# Patient Record
Sex: Female | Born: 1950 | Race: White | Hispanic: No | Smoking: Former smoker
Health system: Southern US, Community
[De-identification: ages and names within clinical notes are randomized; demographics above are authoritative.]

## PROBLEM LIST (undated history)

## (undated) DIAGNOSIS — C50919 Malignant neoplasm of unspecified site of unspecified female breast: Secondary | ICD-10-CM

## (undated) DIAGNOSIS — R002 Palpitations: Secondary | ICD-10-CM

## (undated) DIAGNOSIS — J449 Chronic obstructive pulmonary disease, unspecified: Secondary | ICD-10-CM

## (undated) DIAGNOSIS — R52 Pain, unspecified: Secondary | ICD-10-CM

## (undated) DIAGNOSIS — R7303 Prediabetes: Secondary | ICD-10-CM

## (undated) DIAGNOSIS — F32A Depression, unspecified: Secondary | ICD-10-CM

## (undated) DIAGNOSIS — Z923 Personal history of irradiation: Secondary | ICD-10-CM

## (undated) DIAGNOSIS — R0600 Dyspnea, unspecified: Secondary | ICD-10-CM

## (undated) DIAGNOSIS — I10 Essential (primary) hypertension: Secondary | ICD-10-CM

## (undated) DIAGNOSIS — R198 Other specified symptoms and signs involving the digestive system and abdomen: Secondary | ICD-10-CM

## (undated) DIAGNOSIS — J189 Pneumonia, unspecified organism: Secondary | ICD-10-CM

## (undated) DIAGNOSIS — N951 Menopausal and female climacteric states: Secondary | ICD-10-CM

## (undated) DIAGNOSIS — M1990 Unspecified osteoarthritis, unspecified site: Secondary | ICD-10-CM

## (undated) DIAGNOSIS — I8390 Asymptomatic varicose veins of unspecified lower extremity: Secondary | ICD-10-CM

## (undated) DIAGNOSIS — J45909 Unspecified asthma, uncomplicated: Secondary | ICD-10-CM

## (undated) DIAGNOSIS — K625 Hemorrhage of anus and rectum: Secondary | ICD-10-CM

## (undated) DIAGNOSIS — F329 Major depressive disorder, single episode, unspecified: Secondary | ICD-10-CM

## (undated) DIAGNOSIS — G4700 Insomnia, unspecified: Secondary | ICD-10-CM

## (undated) DIAGNOSIS — Z9221 Personal history of antineoplastic chemotherapy: Secondary | ICD-10-CM

## (undated) DIAGNOSIS — T8859XA Other complications of anesthesia, initial encounter: Secondary | ICD-10-CM

## (undated) DIAGNOSIS — I83813 Varicose veins of bilateral lower extremities with pain: Secondary | ICD-10-CM

## (undated) DIAGNOSIS — F410 Panic disorder [episodic paroxysmal anxiety] without agoraphobia: Secondary | ICD-10-CM

## (undated) DIAGNOSIS — K219 Gastro-esophageal reflux disease without esophagitis: Secondary | ICD-10-CM

## (undated) DIAGNOSIS — J329 Chronic sinusitis, unspecified: Secondary | ICD-10-CM

## (undated) DIAGNOSIS — C801 Malignant (primary) neoplasm, unspecified: Secondary | ICD-10-CM

## (undated) HISTORY — DX: Unspecified asthma, uncomplicated: J45.909

## (undated) HISTORY — DX: Asymptomatic varicose veins of unspecified lower extremity: I83.90

## (undated) HISTORY — DX: Depression, unspecified: F32.A

## (undated) HISTORY — DX: Pain, unspecified: R52

## (undated) HISTORY — DX: Hemorrhage of anus and rectum: K62.5

## (undated) HISTORY — DX: Insomnia, unspecified: G47.00

## (undated) HISTORY — DX: Other specified symptoms and signs involving the digestive system and abdomen: R19.8

## (undated) HISTORY — DX: Major depressive disorder, single episode, unspecified: F32.9

## (undated) HISTORY — DX: Menopausal and female climacteric states: N95.1

## (undated) HISTORY — DX: Chronic sinusitis, unspecified: J32.9

## (undated) HISTORY — DX: Malignant neoplasm of unspecified site of unspecified female breast: C50.919

## (undated) HISTORY — DX: Chronic obstructive pulmonary disease, unspecified: J44.9

## (undated) HISTORY — DX: Palpitations: R00.2

## (undated) HISTORY — PX: TUBAL LIGATION: SHX77

---

## 1983-07-26 HISTORY — PX: CHOLECYSTECTOMY: SHX55

## 2000-07-25 HISTORY — PX: COLONOSCOPY: SHX174

## 2004-11-16 ENCOUNTER — Emergency Department (HOSPITAL_COMMUNITY): Admission: EM | Admit: 2004-11-16 | Discharge: 2004-11-16 | Payer: Self-pay | Admitting: *Deleted

## 2006-11-28 ENCOUNTER — Emergency Department (HOSPITAL_COMMUNITY): Admission: EM | Admit: 2006-11-28 | Discharge: 2006-11-28 | Payer: Self-pay | Admitting: Emergency Medicine

## 2010-08-09 ENCOUNTER — Emergency Department (HOSPITAL_COMMUNITY)
Admission: EM | Admit: 2010-08-09 | Discharge: 2010-08-09 | Payer: Self-pay | Source: Home / Self Care | Admitting: Emergency Medicine

## 2012-04-17 ENCOUNTER — Encounter (HOSPITAL_COMMUNITY): Payer: Self-pay

## 2012-04-17 ENCOUNTER — Emergency Department (HOSPITAL_COMMUNITY)
Admission: EM | Admit: 2012-04-17 | Discharge: 2012-04-17 | Disposition: A | Discharge status: Home / Self Care | Payer: Self-pay | Attending: Emergency Medicine | Admitting: Emergency Medicine

## 2012-04-17 DIAGNOSIS — R51 Headache: Secondary | ICD-10-CM | POA: Insufficient documentation

## 2012-04-17 DIAGNOSIS — Z800 Family history of malignant neoplasm of digestive organs: Secondary | ICD-10-CM | POA: Insufficient documentation

## 2012-04-17 DIAGNOSIS — I10 Essential (primary) hypertension: Secondary | ICD-10-CM | POA: Insufficient documentation

## 2012-04-17 DIAGNOSIS — R42 Dizziness and giddiness: Secondary | ICD-10-CM | POA: Insufficient documentation

## 2012-04-17 DIAGNOSIS — Z8041 Family history of malignant neoplasm of ovary: Secondary | ICD-10-CM | POA: Insufficient documentation

## 2012-04-17 DIAGNOSIS — Z8249 Family history of ischemic heart disease and other diseases of the circulatory system: Secondary | ICD-10-CM | POA: Insufficient documentation

## 2012-04-17 DIAGNOSIS — F17200 Nicotine dependence, unspecified, uncomplicated: Secondary | ICD-10-CM | POA: Insufficient documentation

## 2012-04-17 DIAGNOSIS — IMO0001 Reserved for inherently not codable concepts without codable children: Secondary | ICD-10-CM

## 2012-04-17 DIAGNOSIS — Z823 Family history of stroke: Secondary | ICD-10-CM | POA: Insufficient documentation

## 2012-04-17 LAB — CBC WITH DIFFERENTIAL/PLATELET
Basophils Absolute: 0 10*3/uL (ref 0.0–0.1)
Basophils Relative: 1 % (ref 0–1)
Eosinophils Absolute: 0.2 10*3/uL (ref 0.0–0.7)
Eosinophils Relative: 3 % (ref 0–5)
HCT: 50.4 % — ABNORMAL HIGH (ref 36.0–46.0)
Hemoglobin: 17.6 g/dL — ABNORMAL HIGH (ref 12.0–15.0)
Lymphocytes Relative: 26 % (ref 12–46)
Lymphs Abs: 2.3 10*3/uL (ref 0.7–4.0)
MCH: 30.7 pg (ref 26.0–34.0)
MCHC: 34.9 g/dL (ref 30.0–36.0)
MCV: 87.8 fL (ref 78.0–100.0)
Monocytes Absolute: 0.4 10*3/uL (ref 0.1–1.0)
Monocytes Relative: 5 % (ref 3–12)
Neutro Abs: 5.7 10*3/uL (ref 1.7–7.7)
Neutrophils Relative %: 66 % (ref 43–77)
Platelets: 234 10*3/uL (ref 150–400)
RBC: 5.74 MIL/uL — ABNORMAL HIGH (ref 3.87–5.11)
RDW: 12.7 % (ref 11.5–15.5)
WBC: 8.7 10*3/uL (ref 4.0–10.5)

## 2012-04-17 LAB — URINALYSIS, ROUTINE W REFLEX MICROSCOPIC
Bilirubin Urine: NEGATIVE
Glucose, UA: NEGATIVE mg/dL
Hgb urine dipstick: NEGATIVE
Ketones, ur: NEGATIVE mg/dL
Leukocytes, UA: NEGATIVE
Nitrite: NEGATIVE
Protein, ur: NEGATIVE mg/dL
Specific Gravity, Urine: 1.02 (ref 1.005–1.030)
Urobilinogen, UA: 0.2 mg/dL (ref 0.0–1.0)
pH: 6.5 (ref 5.0–8.0)

## 2012-04-17 LAB — COMPREHENSIVE METABOLIC PANEL
ALT: 13 U/L (ref 0–35)
AST: 12 U/L (ref 0–37)
Albumin: 3.9 g/dL (ref 3.5–5.2)
Alkaline Phosphatase: 69 U/L (ref 39–117)
BUN: 12 mg/dL (ref 6–23)
CO2: 31 mEq/L (ref 19–32)
Calcium: 9.7 mg/dL (ref 8.4–10.5)
Chloride: 102 mEq/L (ref 96–112)
Creatinine, Ser: 0.71 mg/dL (ref 0.50–1.10)
GFR calc Af Amer: >90 mL/min (ref >90)
GFR calc non Af Amer: >90 mL/min (ref >90)
Glucose, Bld: 97 mg/dL (ref 70–99)
Potassium: 4 mEq/L (ref 3.5–5.1)
Sodium: 139 mEq/L (ref 135–145)
Total Bilirubin: 1.2 mg/dL (ref 0.3–1.2)
Total Protein: 7.1 g/dL (ref 6.0–8.3)

## 2012-04-17 MED ORDER — LISINOPRIL-HYDROCHLOROTHIAZIDE 10-12.5 MG PO TABS: 1.0000 | ORAL_TABLET | Freq: Every day | ORAL | Status: DC

## 2012-04-17 MED ORDER — MECLIZINE HCL 25 MG PO TABS: ORAL_TABLET | ORAL | Status: DC

## 2012-04-17 MED ORDER — ONDANSETRON 8 MG PO TBDP
8.0000 mg | ORAL_TABLET | Freq: Once | ORAL | Status: AC
  Administered 2012-04-17: 8 mg via ORAL
  Filled 2012-04-17: qty 1

## 2012-04-17 MED ORDER — PROMETHAZINE HCL 25 MG PO TABS: 25.0000 mg | ORAL_TABLET | Freq: Four times a day (QID) | ORAL | Status: DC | PRN

## 2012-04-17 NOTE — ED Provider Notes (Cosign Needed)
History   This chart was scribed for Ward Givens, MD by Gerlean Ren. This patient was seen in room APA12/APA12 and the patient's care was started at 3:01PM.   CSN: [id number]  Arrival date & time 04/17/12  1327   First MD Initiated Contact with Patient 04/17/12 1407      Chief Complaint  Patient presents with  . Hypertension    (Consider location/radiation/quality/duration/timing/severity/associated sxs/prior treatment) The history is provided by the patient. No language interpreter was used.  Regina Mcmahon is a 61 y.o. female who presents to the Emergency Department complaining of elevated BP with associated nausea and HA.  Pt further reports that she experienced "room spinning" dizziness when waking up 3 days ago causing pt to lay in bed until 1:30PM and has had a HA located at the crown of the head since incident.States she has had this headache before.  States she had to hold onto the wall to walk b/o spinning and she also had nausea with the spinning.  Pt reports taking tylenol PM over past 3 nights to alleviate HA.  Pt denies any numbness, tingling, emesis, chest pain, or unusual SOB since dizziness incident.  Pt reports blurry vision but attributes this to the need for prescription eyeglasses.States her legs swell and hurt at the end of the day. Has some nausea today.   Pt further reports generalized weakness over past 2-3 weeks.  Pt is a current everyday smoker but denies alcohol use.   PCP none  History reviewed. No pertinent past medical history.  Past Surgical History  Procedure Date  . Cholecystectomy     No family history on file. HTN CAD Strokes MOP died age 29 from ovarian cancer Brother died age 48 from colon cancer  History  Substance Use Topics  . Smoking status: Current Every Day Smoker  . Smokeless tobacco: Not on file  . Alcohol Use: No  employed as lab tech  No OB history.    Review of Systems  All other systems reviewed and are  negative.    Allergies  Review of patient's allergies indicates no known allergies.  Home Medications   Current Outpatient Rx  Name Route Sig Dispense Refill  . DIPHENHYDRAMINE-APAP (SLEEP) 25-500 MG PO TABS Oral Take 2 tablets by mouth at bedtime.      BP 191/110  Pulse 89  Temp 98.2 F (36.8 C) (Oral)  Resp 20  Ht 5\' 4"  (1.626 m)  Wt 231 lb 1 oz (104.809 kg)  BMI 39.66 kg/m2  SpO2 98%  Vital signs normal except hypertension   Physical Exam  Nursing note and vitals reviewed. Constitutional: She is oriented to person, place, and time. She appears well-developed and well-nourished.  Non-toxic appearance. She does not appear ill. No distress.  HENT:  Head: Normocephalic and atraumatic.  Right Ear: External ear normal.  Left Ear: External ear normal.  Nose: Nose normal. No mucosal edema or rhinorrhea.  Mouth/Throat: Oropharynx is clear and moist and mucous membranes are normal. No dental abscesses or uvula swelling.  Eyes: Conjunctivae normal and EOM are normal. Pupils are equal, round, and reactive to light.  Neck: Normal range of motion and full passive range of motion without pain. Neck supple.       No bruits.    Cardiovascular: Normal rate, regular rhythm and normal heart sounds.  Exam reveals no gallop and no friction rub.   No murmur heard. Pulmonary/Chest: Effort normal and breath sounds normal. No respiratory distress. She has no  wheezes. She has no rhonchi. She has no rales. She exhibits no tenderness and no crepitus.  Abdominal: Soft. Normal appearance and bowel sounds are normal. She exhibits no distension. There is no tenderness. There is no rebound and no guarding.  Musculoskeletal: Normal range of motion. She exhibits no edema and no tenderness.       Moves all extremities well.   Neurological: She is alert and oriented to person, place, and time. She has normal strength. No cranial nerve deficit.  Skin: Skin is warm, dry and intact. No rash noted. No  erythema. No pallor.  Psychiatric: She has a normal mood and affect. Her speech is normal and behavior is normal. Her mood appears not anxious.    ED Course  Procedures (including critical care time) DIAGNOSTIC STUDIES: Oxygen Saturation is 98% on room air, normal by my interpretation.    COORDINATION OF CARE: 3:11PM- Discussed treatment plan with pt at bedside and pt agreed to plan.  BP at time of my exam was 136/87.   At discharge states she can be followed at the [facility]    Results for orders placed during the hospital encounter of 04/17/12  CBC WITH DIFFERENTIAL      Component Value Range   WBC 8.7  4.0 - 10.5 K/uL   RBC 5.74 (*) 3.87 - 5.11 MIL/uL   Hemoglobin 17.6 (*) 12.0 - 15.0 g/dL   HCT 16.1 (*) 09.6 - 04.5 %   MCV 87.8  78.0 - 100.0 fL   MCH 30.7  26.0 - 34.0 pg   MCHC 34.9  30.0 - 36.0 g/dL   RDW 40.9  81.1 - 91.4 %   Platelets 234  150 - 400 K/uL   Neutrophils Relative 66  43 - 77 %   Neutro Abs 5.7  1.7 - 7.7 K/uL   Lymphocytes Relative 26  12 - 46 %   Lymphs Abs 2.3  0.7 - 4.0 K/uL   Monocytes Relative 5  3 - 12 %   Monocytes Absolute 0.4  0.1 - 1.0 K/uL   Eosinophils Relative 3  0 - 5 %   Eosinophils Absolute 0.2  0.0 - 0.7 K/uL   Basophils Relative 1  0 - 1 %   Basophils Absolute 0.0  0.0 - 0.1 K/uL  COMPREHENSIVE METABOLIC PANEL      Component Value Range   Sodium 139  135 - 145 mEq/L   Potassium 4.0  3.5 - 5.1 mEq/L   Chloride 102  96 - 112 mEq/L   CO2 31  19 - 32 mEq/L   Glucose, Bld 97  70 - 99 mg/dL   BUN 12  6 - 23 mg/dL   Creatinine, Ser 7.82  0.50 - 1.10 mg/dL   Calcium 9.7  8.4 - 95.6 mg/dL   Total Protein 7.1  6.0 - 8.3 g/dL   Albumin 3.9  3.5 - 5.2 g/dL   AST 12  0 - 37 U/L   ALT 13  0 - 35 U/L   Alkaline Phosphatase 69  39 - 117 U/L   Total Bilirubin 1.2  0.3 - 1.2 mg/dL   GFR calc non Af Amer >90  >90 mL/min   GFR calc Af Amer >90  >90 mL/min  URINALYSIS, ROUTINE W REFLEX MICROSCOPIC      Component Value Range   Color, Urine  YELLOW  YELLOW   APPearance CLEAR  CLEAR   Specific Gravity, Urine 1.020  1.005 - 1.030   pH 6.5  5.0 - 8.0   Glucose, UA NEGATIVE  NEGATIVE mg/dL   Hgb urine dipstick NEGATIVE  NEGATIVE   Bilirubin Urine NEGATIVE  NEGATIVE   Ketones, ur NEGATIVE  NEGATIVE mg/dL   Protein, ur NEGATIVE  NEGATIVE mg/dL   Urobilinogen, UA 0.2  0.0 - 1.0 mg/dL   Nitrite NEGATIVE  NEGATIVE   Leukocytes, UA NEGATIVE  NEGATIVE   Laboratory interpretation all normal    Date: 04/17/2012  Rate: 87  Rhythm: normal sinus rhythm  QRS Axis: normal  Intervals: normal  ST/T Wave abnormalities: normal  Conduction Disutrbances:none  Narrative Interpretation: Septal Q waves  Old EKG Reviewed: unchanged from 11/28/2006    1. Vertigo   2. Headache   3. Elevated blood pressure    New Prescriptions   LISINOPRIL-HYDROCHLOROTHIAZIDE (PRINZIDE) 10-12.5 MG PER TABLET    Take 1 tablet by mouth daily.   MECLIZINE (ANTIVERT) 25 MG TABLET    Take 1 or 2 po Q 6hrs for pain   PROMETHAZINE (PHENERGAN) 25 MG TABLET    Take 1 tablet (25 mg total) by mouth every 6 (six) hours as needed for nausea.    Plan discharge  Devoria AlbeIva Tor Tsuda, MD, FACEP    MDM   I personally performed the services described in this documentation, which was scribed in my presence. The recorded information has been reviewed and considered.  Devoria AlbeIva Mujtaba Bollig, MD, FACEP         Ward GivensIva L Denny Lave, MD 04/17/12 [phone]

## 2012-04-17 NOTE — ED Notes (Signed)
Pt c/o high blood pressure that first noticed 2 days ago. Pt states she has never been dx with high BP but has had "flare ups" in the past. Pt also c/o headache but denies weakness.

## 2012-04-17 NOTE — Discharge Instructions (Signed)
Start the blood pressure medications. Follow-up at the [facility] Department in the next 2-3 weeks. You can take the antivert/phenergan for dizziness or nausea as needed. Return to the ED if you get a severe headache, chest pain, shortness of breath or seem worse.

## 2012-04-17 NOTE — ED Notes (Signed)
Pt states she has been checking her blood pressure the past few days and it has been elvelated. States she has also been dizzy.

## 2012-05-08 ENCOUNTER — Emergency Department (HOSPITAL_COMMUNITY)
Admission: EM | Admit: 2012-05-08 | Discharge: 2012-05-08 | Disposition: A | Discharge status: Home / Self Care | Payer: Self-pay | Attending: Emergency Medicine | Admitting: Emergency Medicine

## 2012-05-08 ENCOUNTER — Encounter (HOSPITAL_COMMUNITY): Payer: Self-pay

## 2012-05-08 DIAGNOSIS — R0602 Shortness of breath: Secondary | ICD-10-CM | POA: Insufficient documentation

## 2012-05-08 DIAGNOSIS — R5381 Other malaise: Secondary | ICD-10-CM | POA: Insufficient documentation

## 2012-05-08 DIAGNOSIS — F17200 Nicotine dependence, unspecified, uncomplicated: Secondary | ICD-10-CM | POA: Insufficient documentation

## 2012-05-08 DIAGNOSIS — R51 Headache: Secondary | ICD-10-CM | POA: Insufficient documentation

## 2012-05-08 DIAGNOSIS — Z79899 Other long term (current) drug therapy: Secondary | ICD-10-CM | POA: Insufficient documentation

## 2012-05-08 DIAGNOSIS — H538 Other visual disturbances: Secondary | ICD-10-CM | POA: Insufficient documentation

## 2012-05-08 DIAGNOSIS — E669 Obesity, unspecified: Secondary | ICD-10-CM | POA: Insufficient documentation

## 2012-05-08 DIAGNOSIS — I10 Essential (primary) hypertension: Secondary | ICD-10-CM | POA: Insufficient documentation

## 2012-05-08 DIAGNOSIS — R42 Dizziness and giddiness: Secondary | ICD-10-CM | POA: Insufficient documentation

## 2012-05-08 DIAGNOSIS — R0789 Other chest pain: Secondary | ICD-10-CM | POA: Insufficient documentation

## 2012-05-08 DIAGNOSIS — R5383 Other fatigue: Secondary | ICD-10-CM | POA: Insufficient documentation

## 2012-05-08 DIAGNOSIS — R531 Weakness: Secondary | ICD-10-CM

## 2012-05-08 HISTORY — DX: Panic disorder (episodic paroxysmal anxiety): F41.0

## 2012-05-08 HISTORY — DX: Essential (primary) hypertension: I10

## 2012-05-08 LAB — POCT I-STAT TROPONIN I: Troponin i, poc: 0 ng/mL (ref 0.00–0.08)

## 2012-05-08 NOTE — ED Notes (Signed)
Pt reports weakness and high bp that started today around noon, was told by ems that she had a heart attack and needed to be checked out.

## 2012-05-08 NOTE — ED Provider Notes (Cosign Needed)
History  This chart was scribed for Ward GivensIva L Gilles Trimpe, MD by Ladona Ridgelaylor Day. This patient was seen in room APA18/APA18 and the patient's care was started at 1402.  CSN: [id number]  Arrival date & time 05/08/12  1402   First MD Initiated Contact with Patient 05/08/12 1432     Chief Complaint  Patient presents with  . Weakness  . Hypertension   Patient is a 61 y.o. female presenting with hypertension. The history is provided by the patient. No language interpreter was used.  Hypertension This is a recurrent problem. The current episode started 3 to 5 hours ago. The problem occurs constantly. The problem has been gradually improving. Associated symptoms include headaches. Pertinent negatives include no chest pain and no abdominal pain. The symptoms are aggravated by stress. Nothing relieves the symptoms. She has tried nothing for the symptoms.   Shelia Mediakealene P Christians is a 61 y.o. female who presents to the Emergency Department complaining of episode of elevated BP  2 hours ago while she was at work but has somewhat improved. She states began feeling weak, dizzy, posterior HA radiating to right scapula, chest tightness, blurry vision while at work today and overall stressful morning. She reports she was late to work and when she got to work she was extremely behind in getting her work done. EMS was called and recorded SBP about 200 then 166 before they left. Her initial BP has improved since here it was 134/95. She denies any diaphoresis or nausea. She has been taking her daily BP medicines in the AM. She was seen by local health department last Thursday, 5 days ago and has another appointment this Thursday in 2 days.  Patient reports after being very stressed at work she went outside to sit in her car and eat lunch. She states that she got very dizzy and lightheaded and had pain in the back of her head that radiated into both her shoulder blades. She denies diaphoresis. She went back into work. She states when she  stood up she felt very weak and felt like she was going to pass out. She also states she was eating her lunch outside and felt like she couldn't swallow her food. She also states she felt some shortness of breath. She denies any chest pain. She is concerned because EMS told her it appeared that she had had a heart attack on her EKG.  She smokes 1.5 ppd and does not drink.  [facility]  Past Medical History  Diagnosis Date  . Hypertension   . Panic attacks     Past Surgical History  Procedure Date  . Cholecystectomy    No family history on file.  History  Substance Use Topics  . Smoking status: Current Every Day Smoker 1 1/2 ppd    Types: Cigarettes  . Smokeless tobacco: Not on file  . Alcohol Use: No  employed  OB History    Grav Para Term Preterm Abortions TAB SAB Ect Mult Living                 Review of Systems  Constitutional: Negative for fever and chills.  HENT: Negative for congestion.   Respiratory: Positive for chest tightness. Negative for cough.   Cardiovascular: Negative for chest pain.  Gastrointestinal: Negative for nausea, vomiting, abdominal pain and diarrhea.  Musculoskeletal: Negative for back pain.  Neurological: Positive for dizziness and headaches.  All other systems reviewed and are negative.   Allergies  Review of patient's  allergies indicates no known allergies.  Home Medications   Current Outpatient Rx  Name Route Sig Dispense Refill  . DIPHENHYDRAMINE-APAP (SLEEP) 25-500 MG PO TABS Oral Take 2 tablets by mouth at bedtime.    Marland Kitchen LISINOPRIL-HYDROCHLOROTHIAZIDE 10-12.5 MG PO TABS Oral Take 1 tablet by mouth daily. 30 tablet 0  . MECLIZINE HCL 25 MG PO TABS  Take 1 or 2 po Q 6hrs for pain 60 tablet 0  . PROMETHAZINE HCL 25 MG PO TABS Oral Take 1 tablet (25 mg total) by mouth every 6 (six) hours as needed for nausea. 10 tablet 0    Triage Vitals: BP 134/95  Pulse 82  Temp 98.4 F (36.9 C) (Oral)  Resp 20  Ht   (1.626 m)  Wt 226 lb (102.513 kg)  BMI 38.79 kg/m2  SpO2 100%  Vital signs normal    Physical Exam  Nursing note and vitals reviewed. Constitutional: She is oriented to person, place, and time. She appears well-developed and well-nourished.  Non-toxic appearance. She does not appear ill. No distress.       Obese   HENT:  Head: Normocephalic and atraumatic.  Right Ear: External ear normal.  Left Ear: External ear normal.  Nose: Nose normal. No mucosal edema or rhinorrhea.  Mouth/Throat: Mucous membranes are normal. No dental abscesses or uvula swelling.       Dry mucous membranes  Eyes: Conjunctivae normal and EOM are normal. Pupils are equal, round, and reactive to light.  Neck: Normal range of motion and full passive range of motion without pain. Neck supple.  Cardiovascular: Normal rate, regular rhythm and normal heart sounds.  Exam reveals no gallop and no friction rub.   No murmur heard. Pulmonary/Chest: Effort normal. No respiratory distress. She has wheezes (mild expiratory wheeze). She has no rhonchi. She has no rales. She exhibits no tenderness and no crepitus.  Abdominal: Soft. Normal appearance and bowel sounds are normal. She exhibits no distension. There is no tenderness. There is no rebound and no guarding.  Musculoskeletal: Normal range of motion. She exhibits no edema and no tenderness.       Moves all extremities well.   Neurological: She is alert and oriented to person, place, and time. She has normal strength. No cranial nerve deficit.  Skin: Skin is warm, dry and intact. No rash noted. No erythema. No pallor.  Psychiatric: She has a normal mood and affect. Her speech is normal and behavior is normal. Her mood appears not anxious.   ED Course  Procedures (including critical care time) Medications - No data to display DIAGNOSTIC STUDIES: Oxygen Saturation is 100% on room air, normal by my interpretation.    COORDINATION OF CARE: At 315 PM Discussed  treatment plan with patient which includes keeping a BP log at home tracking her BP throughout the day. Patient agrees.    Orthostatics were negative. BP remains 130/71 without treatment.   patient relates the discomfort in the back of her head and her shoulder blades has resolved as her blood pressure has improved.  Results for orders placed during the hospital encounter of 05/08/12  POCT I-STAT TROPONIN I      Component Value Range   Troponin i, poc 0.00  0.00 - 0.08 ng/mL   Comment 3            Troponin 0.0  Laboratory interpretation all normal except concentrated Hb (smokes 1 1/2 ppd)   Date: 05/08/2012  Rate: 78  Rhythm: normal sinus rhythm  QRS Axis: left  Intervals: normal  ST/T Wave abnormalities: normal  Conduction Disutrbances:IRBBB  Narrative Interpretation:   Old EKG Reviewed: unchanged from 04/17/2012 (and from May 2008     1. Hypertension   2. Episode of generalized weakness     Plan discharge  Devoria Albe, MD, FACEP   MDM  I personally performed the services described in this documentation, which was scribed in my presence. The recorded information has been reviewed and considered.  Devoria Albe, MD, Armando Gang          Ward Givens, MD 05/08/12 1728

## 2012-05-08 NOTE — Discharge Instructions (Signed)
Make a blood pressure log like we discussed so you can show your doctor. [facility], let her know about your ED visit today.

## 2012-10-22 ENCOUNTER — Other Ambulatory Visit (HOSPITAL_COMMUNITY): Payer: Self-pay | Admitting: Pulmonary Disease

## 2012-10-22 DIAGNOSIS — Z139 Encounter for screening, unspecified: Secondary | ICD-10-CM

## 2012-10-23 ENCOUNTER — Ambulatory Visit (HOSPITAL_COMMUNITY)
Admission: RE | Admit: 2012-10-23 | Discharge: 2012-10-23 | Disposition: A | Discharge status: Home / Self Care | Payer: 59 | Source: Ambulatory Visit | Attending: Pulmonary Disease | Admitting: Pulmonary Disease

## 2012-10-23 DIAGNOSIS — Z1231 Encounter for screening mammogram for malignant neoplasm of breast: Secondary | ICD-10-CM | POA: Insufficient documentation

## 2012-10-23 DIAGNOSIS — Z139 Encounter for screening, unspecified: Secondary | ICD-10-CM

## 2012-10-23 HISTORY — PX: BREAST BIOPSY: SHX20

## 2012-10-23 IMAGING — MG MM DIGITAL SCREENING BILAT W/ CAD
5 series · 5 of 5 positions shown · non-contrast
Comparison: Previous exams.

CLINICAL DATA: Screening.

DIGITAL BILATERAL SCREENING MAMMOGRAM WITH CAD

[L CC]
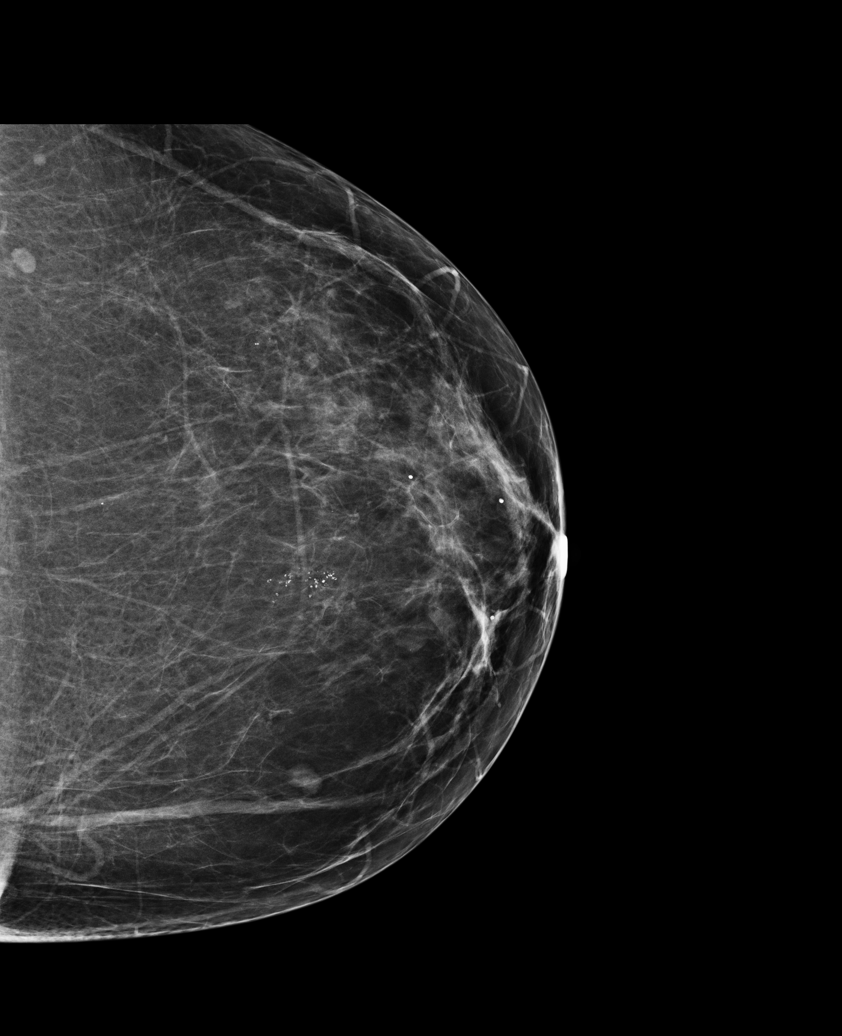

[L MLO]
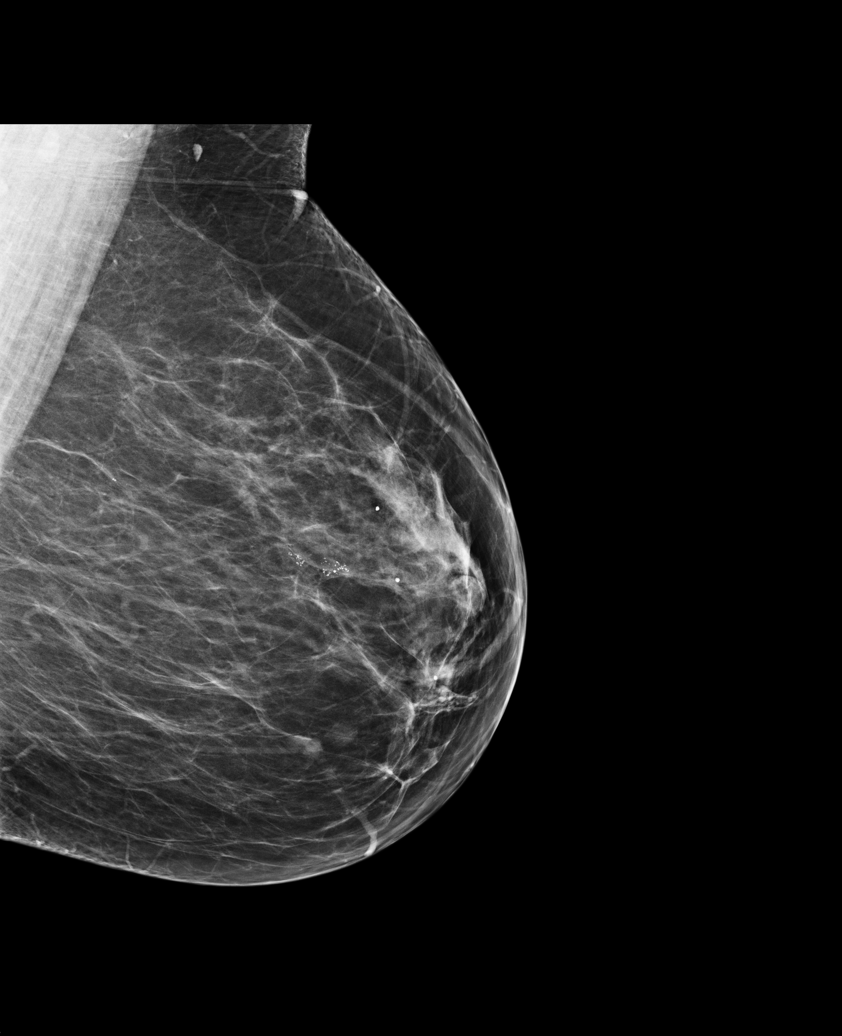

[R CC]
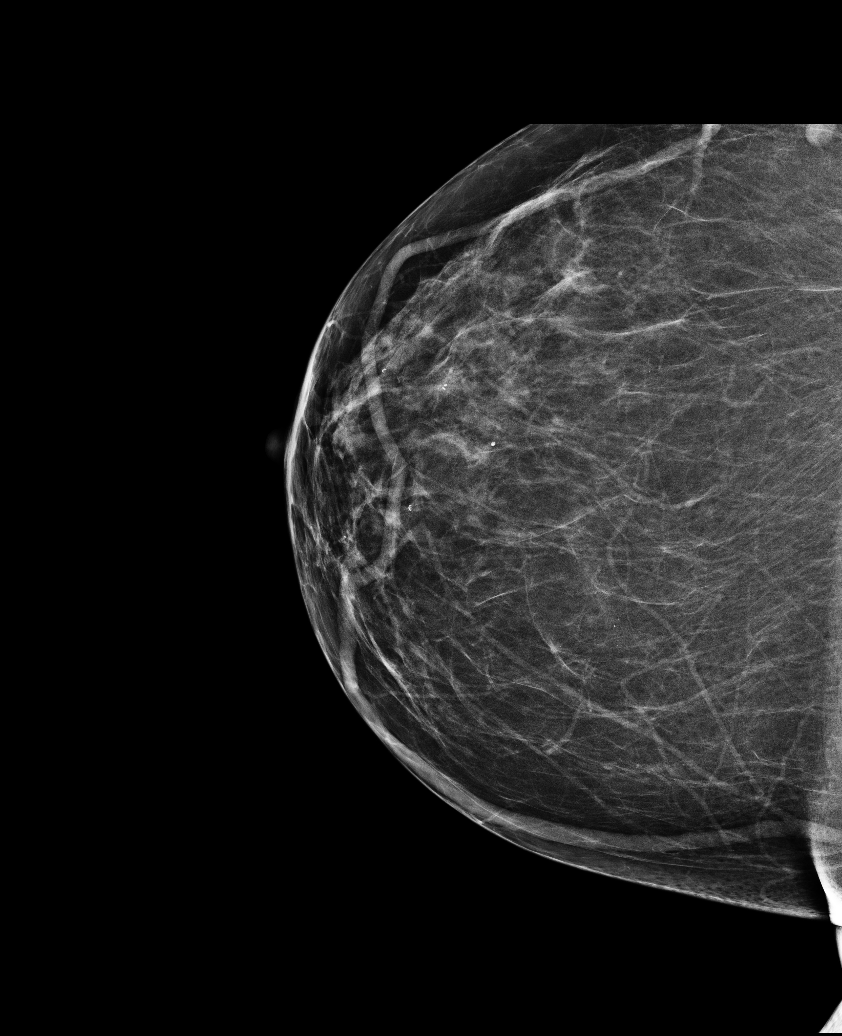

[R MLO (1 of 2)]
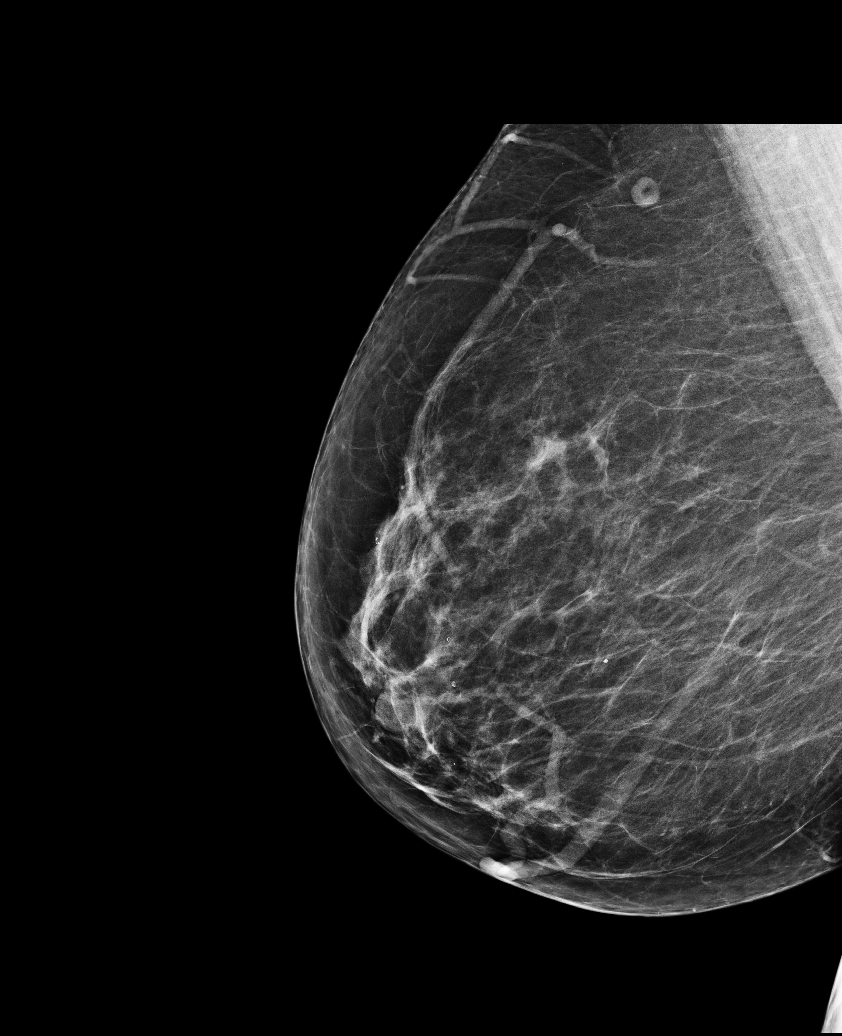

[R MLO (2 of 2)]
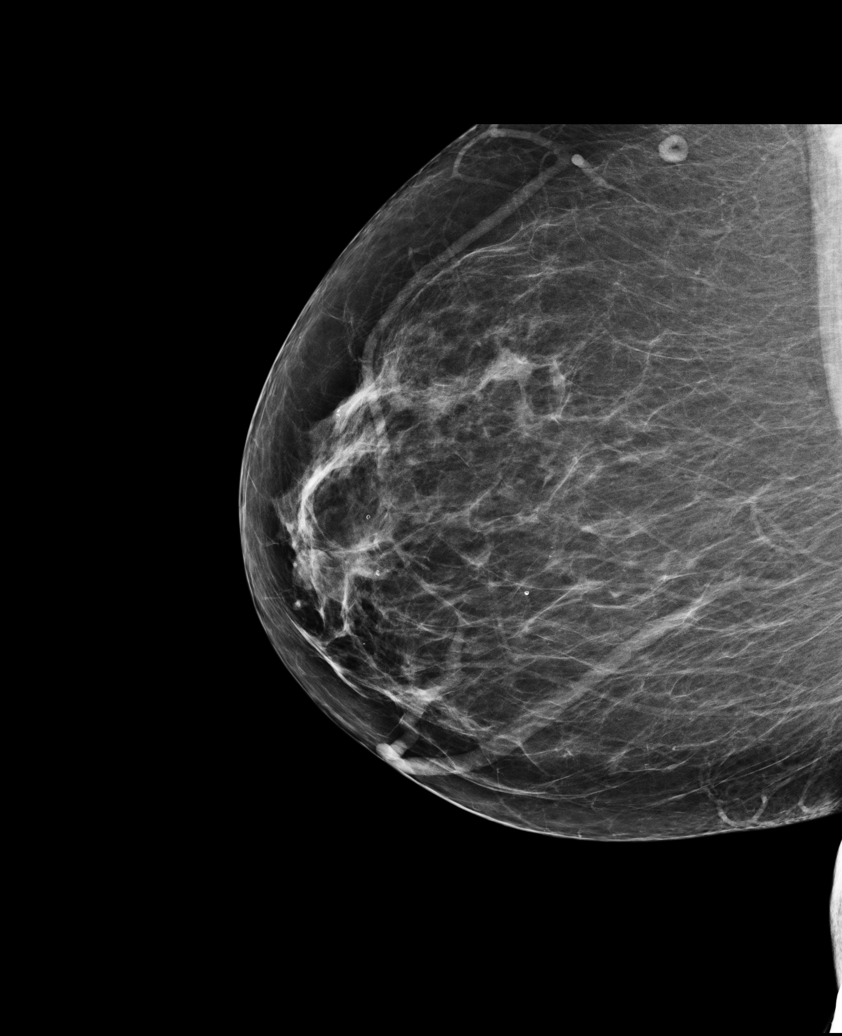

[5 of 5 positions shown; findings below may reference images not displayed]

FINDINGS: ACR Breast Density Category 2: There is a scattered fibroglandular
pattern.

In the left breast, calcifications warrant further evaluation with
magnified views.  In the right breast, no mass or malignant type
calcifications are identified.

Images were processed with CAD.
IMPRESSION: Further evaluation is suggested for calcifications in the left
breast.

RECOMMENDATION:
Diagnostic mammogram of the left breast. (Code:[VS])

The patient will be contacted regarding the findings, and
additional imaging will be scheduled.

BI-RADS CATEGORY 0:  Incomplete.  Need additional imaging
evaluation and/or prior mammograms for comparison.

## 2012-10-24 ENCOUNTER — Other Ambulatory Visit: Payer: Self-pay | Admitting: Pulmonary Disease

## 2012-10-24 DIAGNOSIS — R928 Other abnormal and inconclusive findings on diagnostic imaging of breast: Secondary | ICD-10-CM

## 2012-10-29 ENCOUNTER — Encounter: Payer: Self-pay | Admitting: *Deleted

## 2012-10-29 DIAGNOSIS — K921 Melena: Secondary | ICD-10-CM

## 2012-10-29 DIAGNOSIS — R197 Diarrhea, unspecified: Secondary | ICD-10-CM | POA: Insufficient documentation

## 2012-10-29 DIAGNOSIS — F17200 Nicotine dependence, unspecified, uncomplicated: Secondary | ICD-10-CM

## 2012-10-29 DIAGNOSIS — I10 Essential (primary) hypertension: Secondary | ICD-10-CM | POA: Insufficient documentation

## 2012-10-29 DIAGNOSIS — F419 Anxiety disorder, unspecified: Secondary | ICD-10-CM

## 2012-10-29 DIAGNOSIS — J449 Chronic obstructive pulmonary disease, unspecified: Secondary | ICD-10-CM

## 2012-10-29 DIAGNOSIS — R52 Pain, unspecified: Secondary | ICD-10-CM

## 2012-10-29 DIAGNOSIS — F17210 Nicotine dependence, cigarettes, uncomplicated: Secondary | ICD-10-CM | POA: Insufficient documentation

## 2012-10-30 ENCOUNTER — Encounter (INDEPENDENT_AMBULATORY_CARE_PROVIDER_SITE_OTHER): Payer: Self-pay | Admitting: *Deleted

## 2012-11-07 ENCOUNTER — Ambulatory Visit (HOSPITAL_COMMUNITY)
Admission: RE | Admit: 2012-11-07 | Discharge: 2012-11-07 | Disposition: A | Discharge status: Home / Self Care | Payer: 59 | Source: Ambulatory Visit | Attending: Pulmonary Disease | Admitting: Pulmonary Disease

## 2012-11-07 ENCOUNTER — Other Ambulatory Visit: Payer: Self-pay | Admitting: Pulmonary Disease

## 2012-11-07 DIAGNOSIS — R928 Other abnormal and inconclusive findings on diagnostic imaging of breast: Secondary | ICD-10-CM | POA: Insufficient documentation

## 2012-11-07 DIAGNOSIS — R921 Mammographic calcification found on diagnostic imaging of breast: Secondary | ICD-10-CM

## 2012-11-12 ENCOUNTER — Encounter: Payer: Self-pay | Admitting: Obstetrics and Gynecology

## 2012-11-14 ENCOUNTER — Ambulatory Visit
Admission: RE | Admit: 2012-11-14 | Discharge: 2012-11-14 | Disposition: A | Discharge status: Home / Self Care | Payer: 59 | Source: Ambulatory Visit | Attending: Pulmonary Disease | Admitting: Pulmonary Disease

## 2012-11-14 DIAGNOSIS — R921 Mammographic calcification found on diagnostic imaging of breast: Secondary | ICD-10-CM

## 2012-11-14 IMAGING — MG MM BX 1ST LESION STEREO
2 series · 2 of 2 positions shown · non-contrast
Comparison: Previous exams.

***ADDENDUM*** CREATED: [DATE] [DATE]

Pathology revealed a fibroadenoma with calcifications in the left
breast. This was found to be concordant by Dr. JIM.
Pathology was relayed to the patient by telephone. The patient
reported doing well after the biopsy with minimal tenderness at the
site. Post biopsy instructions were reviewed and her questions were
answered. She was encouraged to call The [REDACTED] for any additional concerns. She was asked to
return in 1 year for screening mammography.
Pathology results are dictated by JIM RN, BSN on [DATE].
***END ADDENDUM*** SIGNED BY: JIM, M.D.
CLINICAL DATA: Left breast calcifications.
STEREOTACTIC-GUIDED VACUUM ASSISTED BIOPSY OF THE LEFT BREAST AND
SPECIMEN RADIOGRAPH

[L CC]
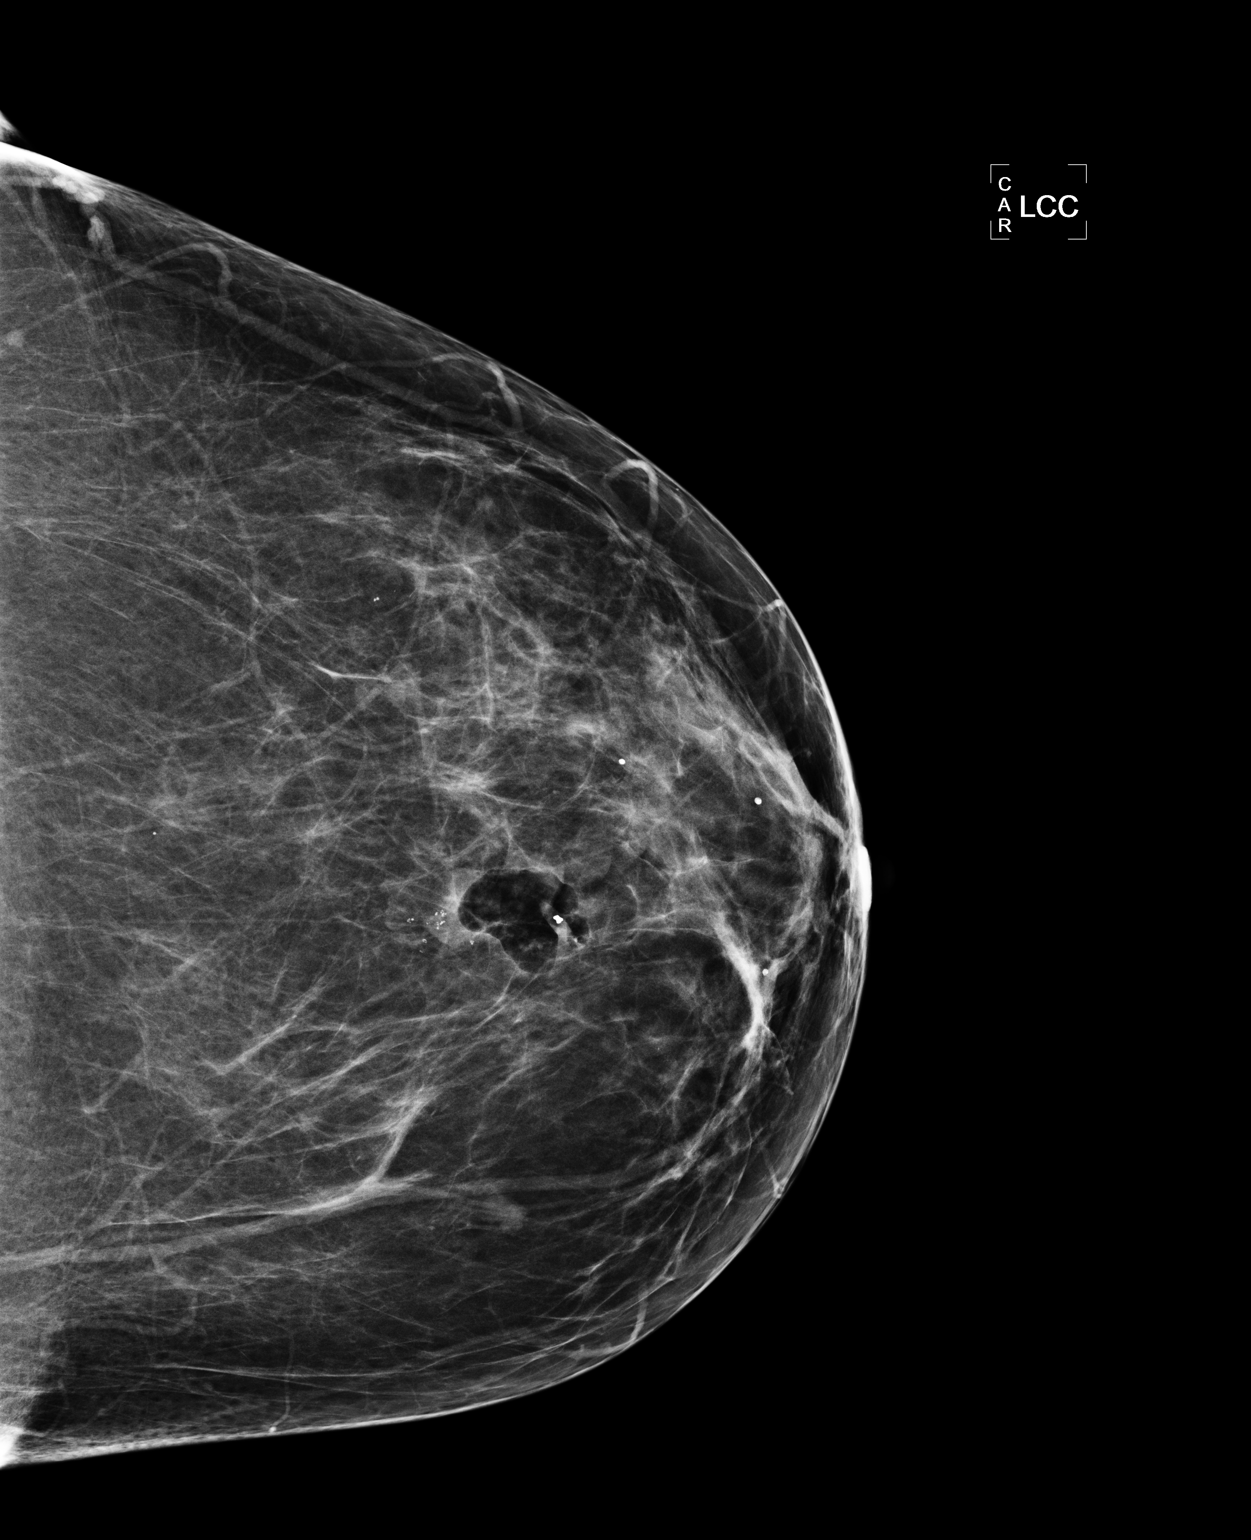

[L ML]
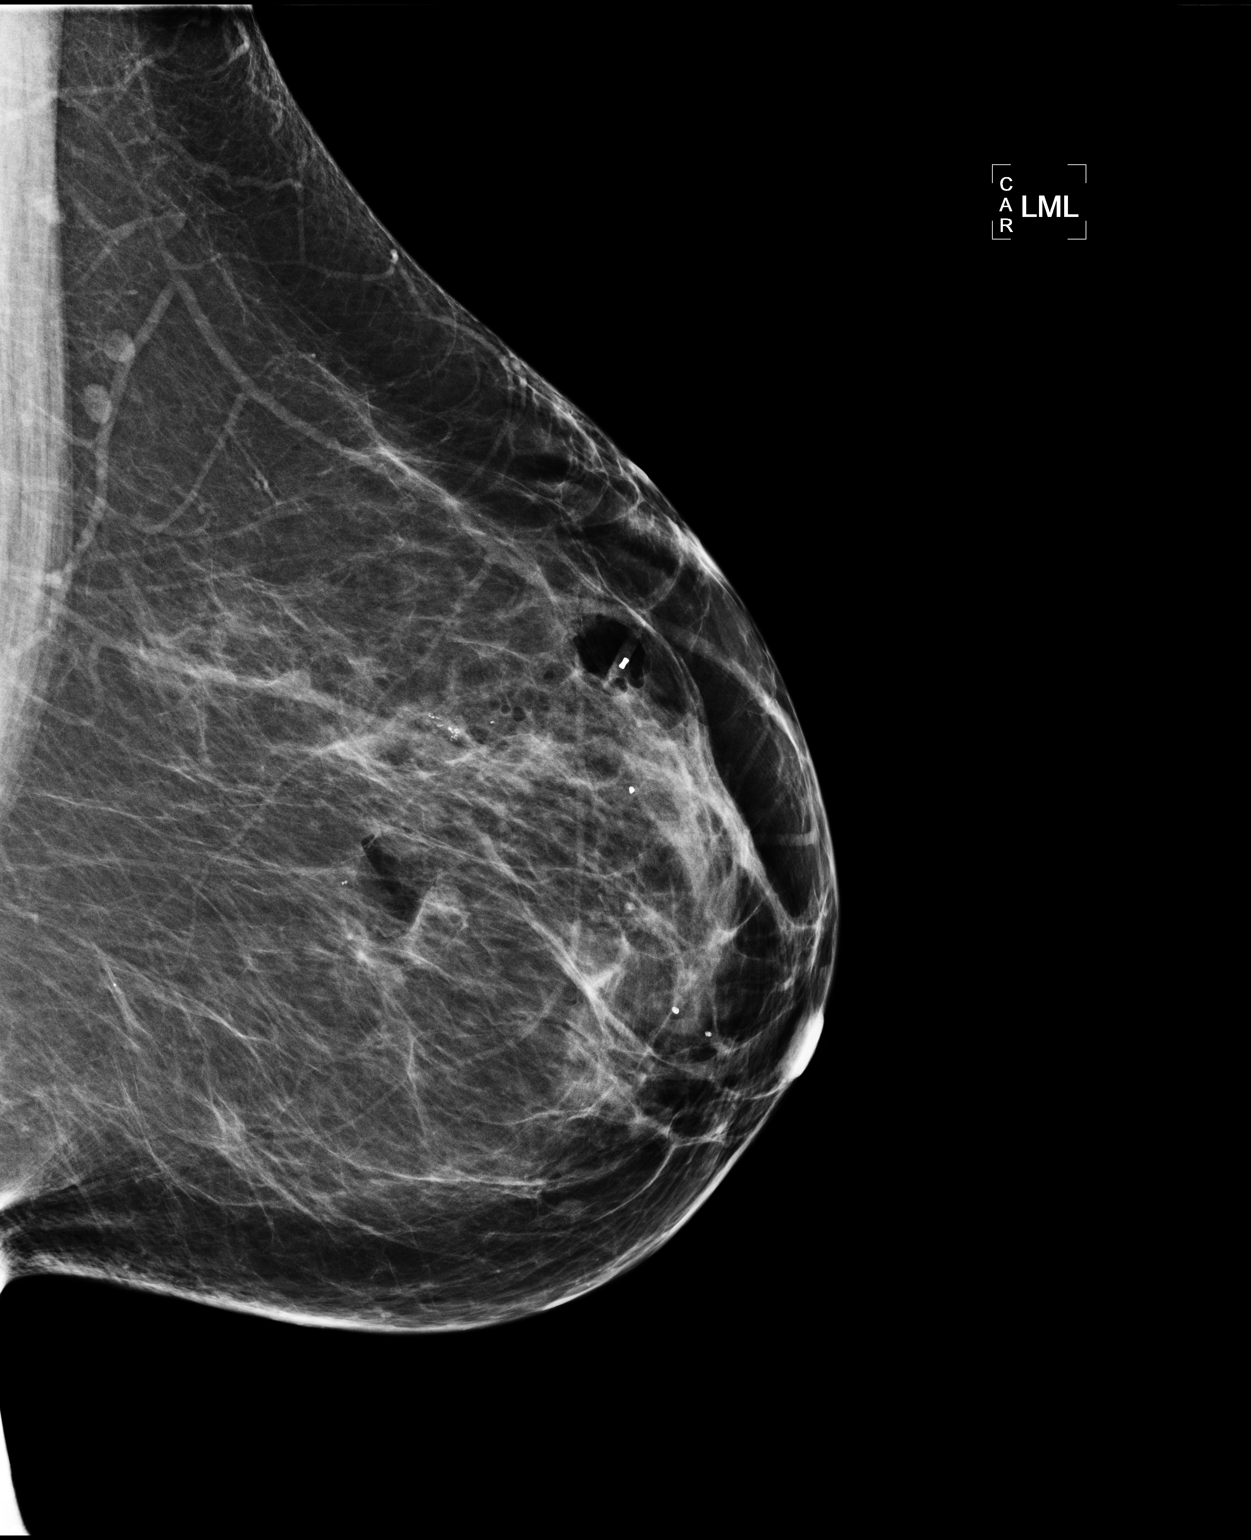

[2 of 2 positions shown; findings below may reference images not displayed]

I met with the patient and we discussed the procedure of
stereotactic-guided biopsy, including benefits and alternatives.
We discussed the high likelihood of a successful procedure. We
discussed the risks of the procedure, including infection,
bleeding, tissue injury, clip migration, and inadequate sampling.
Informed, written consent was given.

Using sterile technique, 2% lidocaine, stereotactic guidance, and a
9 gauge vacuum assisted device, biopsy was performed of the
clustered calcifications located within the superior left breast
using a superior craniocaudal approach.  Specimen radiograph was
performed, showing representative calcifications within the
specimen.  Specimens with calcifications are identified for
pathology.

At the conclusion of the procedure, a an hourglass shaped tissue
marker clip was deployed into the biopsy cavity.  Follow-up 2-view
mammogram confirmed clip 2 cm superior to the residual
calcifications..
IMPRESSION: Stereotactic-guided biopsy of left breast calcifications as
discussed above.  No apparent complications.

## 2012-11-22 ENCOUNTER — Other Ambulatory Visit (HOSPITAL_COMMUNITY)
Admission: RE | Admit: 2012-11-22 | Discharge: 2012-11-22 | Disposition: A | Discharge status: Home / Self Care | Payer: 59 | Source: Ambulatory Visit | Attending: Obstetrics and Gynecology | Admitting: Obstetrics and Gynecology

## 2012-11-22 ENCOUNTER — Ambulatory Visit (INDEPENDENT_AMBULATORY_CARE_PROVIDER_SITE_OTHER): Payer: 59 | Admitting: Obstetrics and Gynecology

## 2012-11-22 ENCOUNTER — Encounter: Payer: Self-pay | Admitting: Obstetrics and Gynecology

## 2012-11-22 VITALS — BP 160/90 | Ht 64.0 in | Wt 218.4 lb

## 2012-11-22 DIAGNOSIS — Z124 Encounter for screening for malignant neoplasm of cervix: Secondary | ICD-10-CM

## 2012-11-22 DIAGNOSIS — Z1151 Encounter for screening for human papillomavirus (HPV): Secondary | ICD-10-CM | POA: Insufficient documentation

## 2012-11-22 DIAGNOSIS — Z01419 Encounter for gynecological examination (general) (routine) without abnormal findings: Secondary | ICD-10-CM | POA: Insufficient documentation

## 2012-11-22 DIAGNOSIS — Z1212 Encounter for screening for malignant neoplasm of rectum: Secondary | ICD-10-CM

## 2012-11-22 LAB — HEMOCCULT GUIAC POC 1CARD (OFFICE): Fecal Occult Blood, POC: NEGATIVE

## 2012-11-22 NOTE — Patient Instructions (Signed)
Take care of yourself makes 58 an aAWESOME  birthday

## 2012-11-22 NOTE — Progress Notes (Signed)
Subjective:     Regina Mcmahon is a 62 y.o. woman who comes in today for a  pap smear only. Her most recent annual exam was on *2001**. Her most recent Pap smear was on 2001 and showed no abnormalities. Previous abnormal Pap smears: no. Contraception: Postmenopausal  Smokes one pack per day, drinks 3 sun drop per day  Review of Systems Pertinent items are noted in HPI.  Family history positive for mother had ovarian cancer Brother had colon cancer, patient recently had breast biopsy was made Objective:    BP 160/90  Ht 5\' 4"  (1.626 m)  Wt 218 lb 6.4 oz (99.066 kg)  BMI 37.47 kg/m2 Pelvic Exam: cervix normal in appearance, exam obscured by obesity, external genitalia normal, no adnexal masses or tenderness, no bladder tenderness, no cervical motion tenderness and vagina normal without discharge. Pap smear obtained.  Hemoccult negative  Assessment:    Screening pap smear.   Plan:    Follow up in 2 years, or as indicated by Pap results.

## 2012-11-27 ENCOUNTER — Encounter: Payer: Self-pay | Admitting: Obstetrics and Gynecology

## 2014-06-09 ENCOUNTER — Other Ambulatory Visit: Payer: Self-pay | Admitting: Obstetrics and Gynecology

## 2014-06-09 DIAGNOSIS — N6320 Unspecified lump in the left breast, unspecified quadrant: Secondary | ICD-10-CM

## 2014-06-24 ENCOUNTER — Other Ambulatory Visit (HOSPITAL_COMMUNITY): Payer: Self-pay | Admitting: Pulmonary Disease

## 2014-06-24 ENCOUNTER — Other Ambulatory Visit: Payer: Self-pay | Admitting: Obstetrics and Gynecology

## 2014-06-24 ENCOUNTER — Ambulatory Visit (HOSPITAL_COMMUNITY)
Admission: RE | Admit: 2014-06-24 | Discharge: 2014-06-24 | Disposition: A | Discharge status: Home / Self Care | Payer: 59 | Source: Ambulatory Visit | Attending: Obstetrics and Gynecology | Admitting: Obstetrics and Gynecology

## 2014-06-24 ENCOUNTER — Ambulatory Visit (HOSPITAL_COMMUNITY)
Admission: RE | Admit: 2014-06-24 | Discharge: 2014-06-24 | Disposition: A | Discharge status: Home / Self Care | Payer: 59 | Source: Ambulatory Visit | Attending: Pulmonary Disease | Admitting: Pulmonary Disease

## 2014-06-24 DIAGNOSIS — N63 Unspecified lump in breast: Secondary | ICD-10-CM | POA: Diagnosis not present

## 2014-06-24 DIAGNOSIS — R229 Localized swelling, mass and lump, unspecified: Principal | ICD-10-CM

## 2014-06-24 DIAGNOSIS — N6320 Unspecified lump in the left breast, unspecified quadrant: Secondary | ICD-10-CM

## 2014-06-24 DIAGNOSIS — IMO0002 Reserved for concepts with insufficient information to code with codable children: Secondary | ICD-10-CM

## 2014-06-24 DIAGNOSIS — C50919 Malignant neoplasm of unspecified site of unspecified female breast: Secondary | ICD-10-CM

## 2014-06-24 HISTORY — DX: Malignant neoplasm of unspecified site of unspecified female breast: C50.919

## 2014-06-24 IMAGING — US US BREAST LTD UNI LEFT INC AXILLA
1 series · 11 of 11 positions shown · non-contrast
Comparison: Previous examinations, the most recent dated
[DATE], [DATE] and [DATE].

CLINICAL DATA: Mass with tenderness felt by the patient in the
upper outer left breast.

EXAM:
DIGITAL DIAGNOSTIC  BILATERAL MAMMOGRAM WITH CAD
ULTRASOUND LEFT BREAST

[Series 1: us breast ltd uni left inc axilla · 0.07mm/px · 11 of 11 slices shown]
[im 1/11]
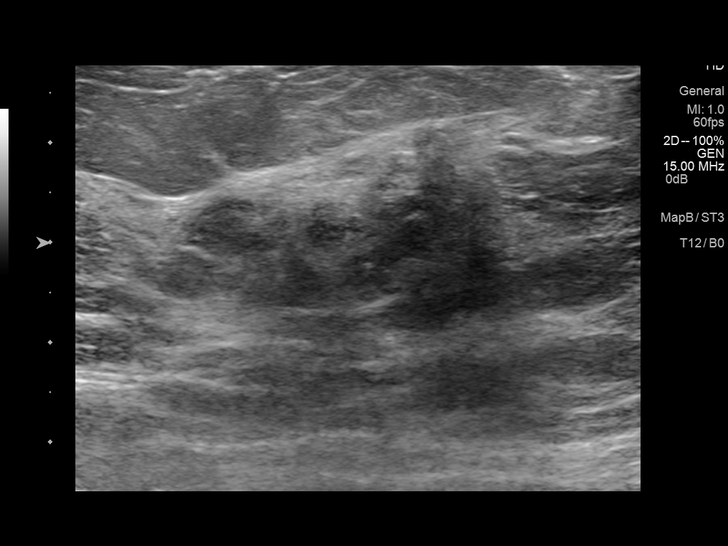
[im 2/11]
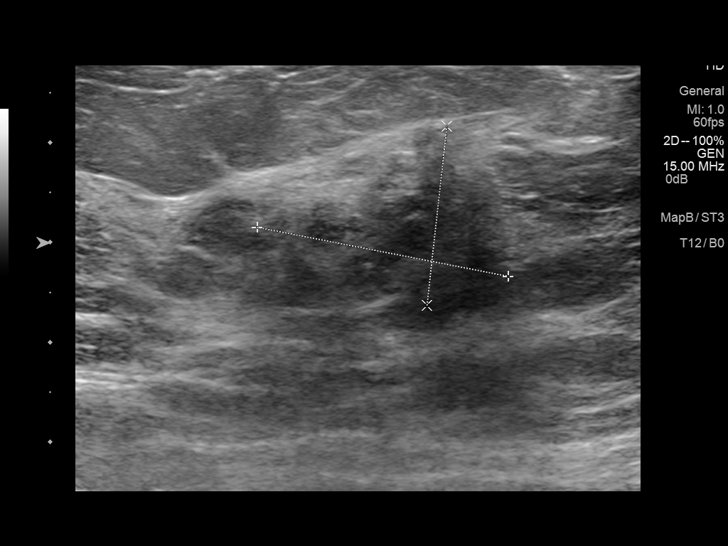
[im 3/11]
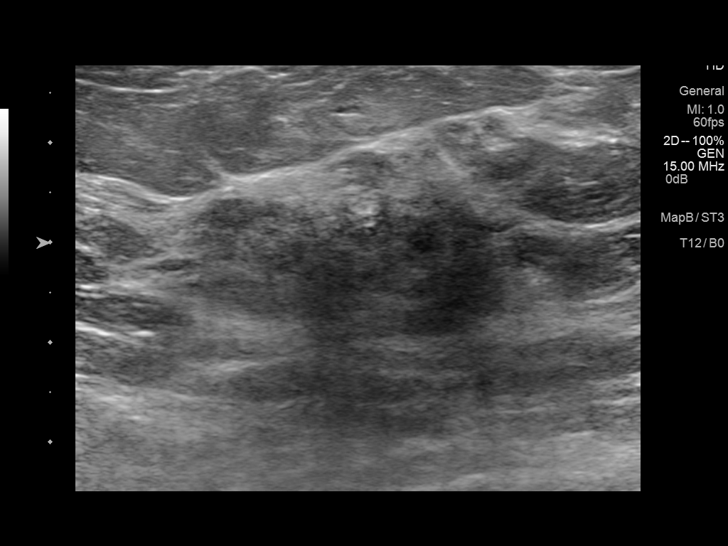
[im 4/11]
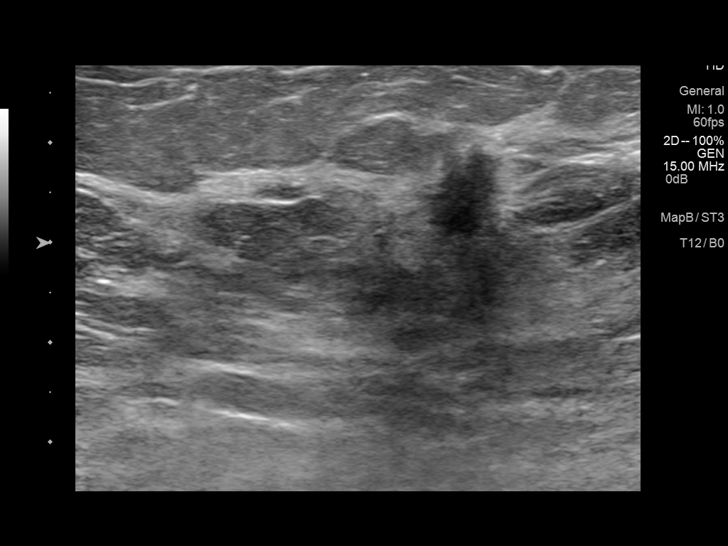
[im 5/11]
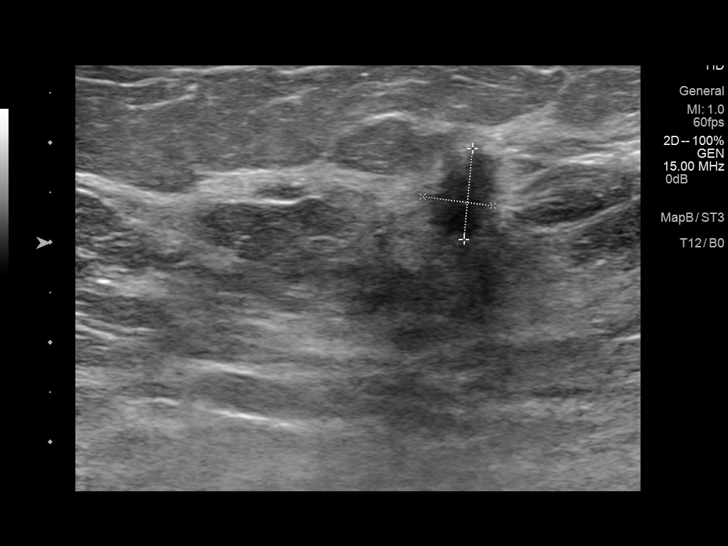
[im 6/11]
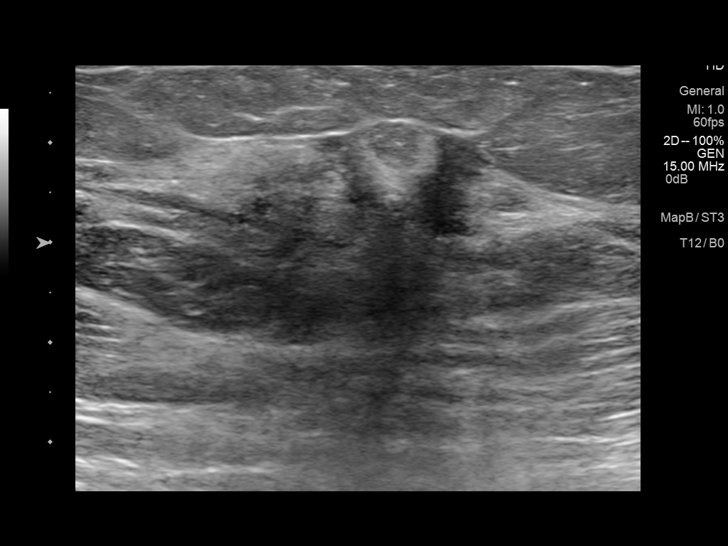
[im 7/11]
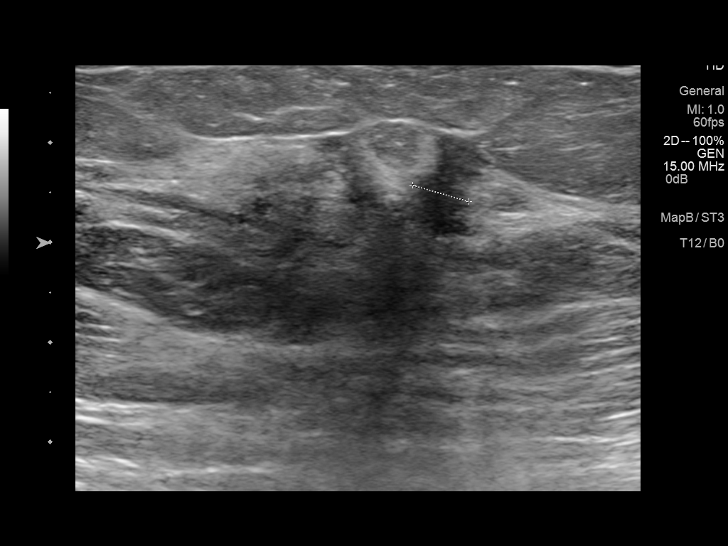
[im 8/11]
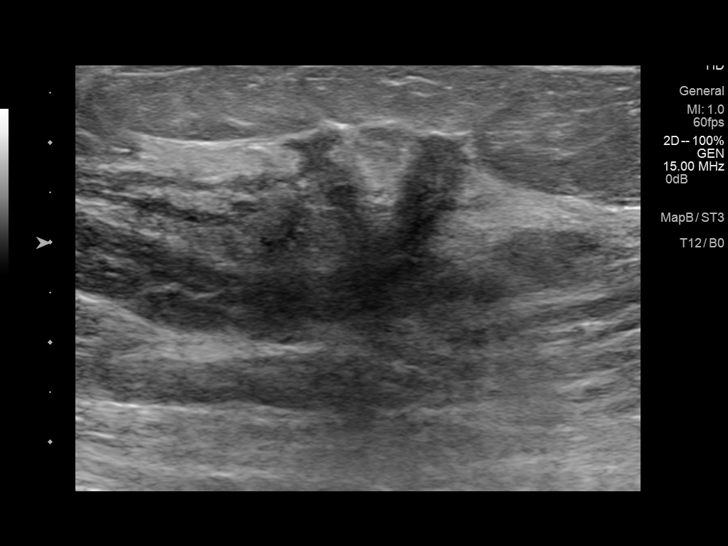
[im 9/11]
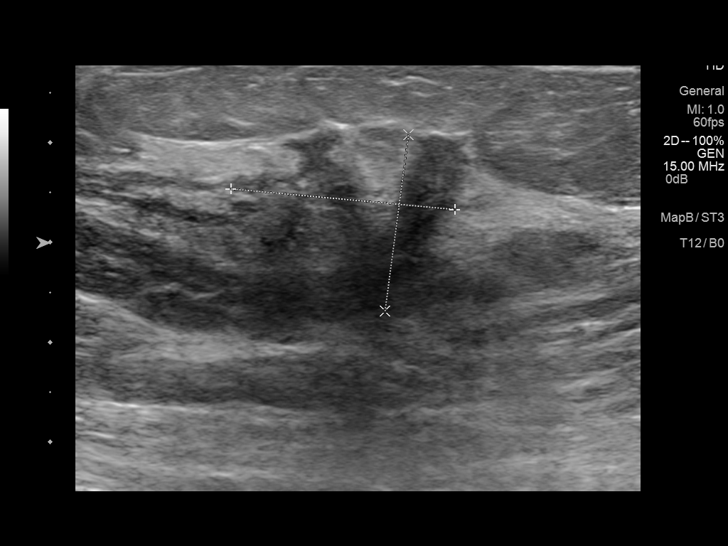
[im 10/11]
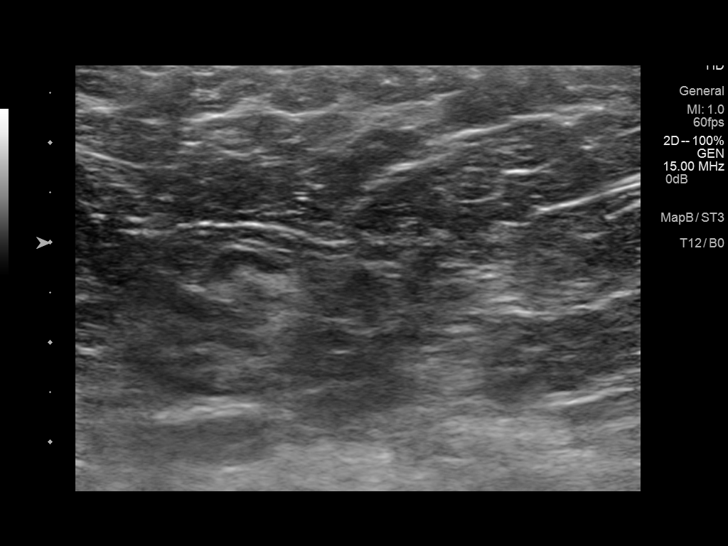
[im 11/11]
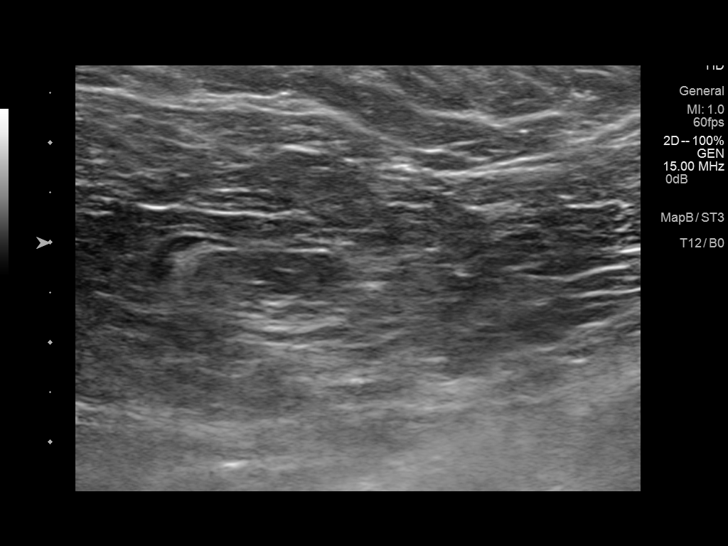

[11 of 11 positions shown; findings below may reference images not displayed]

ACR Breast Density Category b: There are scattered areas of
fibroglandular density.
FINDINGS: Interval approximately 6 x 3 x 2.5 cm area of ill-defined increased
density in the upper outer left breast, corresponding to the mass
felt by the patient, marked with a metallic marker. This is not at
the location of the previously biopsied benign calcifications. No
findings elsewhere in either breast suspicious for malignancy.

Mammographic images were processed with CAD.

On physical exam, the patient has an approximately 2.5 x 1.5 cm oval
area of irregular, palpable soft tissue thickening in the 2 o'clock
position of the left breast, centered 7 cm from the nipple. There
are no palpable left axillary lymph nodes.

Ultrasound is performed, showing an ill-defined predominantly
hypoechoic area with some increased echogenicity in the 2 o'clock
position of the left breast, 7 cm from the nipple. This has
irregular margins and measures approximately 2.6 x 2.3 x 1.8 cm in
maximum dimensions. At the anterior, superolateral aspect of this
abnormality, this is slightly more focally hypoechoic and irregular,
measuring 9 x 7 x 6 mm.

Ultrasound of the left axilla demonstrated a normal appearing left
axillary lymph node with no abnormal appearing nodes seen.
IMPRESSION: Irregular, heterogeneous mass-like area in the 2 o'clock position of
the left breast, corresponding to the palpable mass, as described
above. This has imaging features suspicious for malignancy.

RECOMMENDATION:
Ultrasound-guided core needle biopsy of the irregular mass-like area
in the 2 o'clock position of the left breast. This has been
discussed with the patient and scheduled for 1 p.m. on [DATE].

I have discussed the findings and recommendations with the patient.
Results were also provided in writing at the conclusion of the
visit. If applicable, a reminder letter will be sent to the patient
regarding the next appointment.

BI-RADS CATEGORY  5: Highly suggestive of malignancy.

## 2014-06-24 IMAGING — MG MM DIGITAL DIAGNOSTIC BILAT CAD
7 series · 7 of 7 positions shown · non-contrast
Comparison: Previous examinations, the most recent dated
[DATE], [DATE] and [DATE].

CLINICAL DATA: Mass with tenderness felt by the patient in the
upper outer left breast.

EXAM:
DIGITAL DIAGNOSTIC  BILATERAL MAMMOGRAM WITH CAD
ULTRASOUND LEFT BREAST

[L CC (1 of 2)]
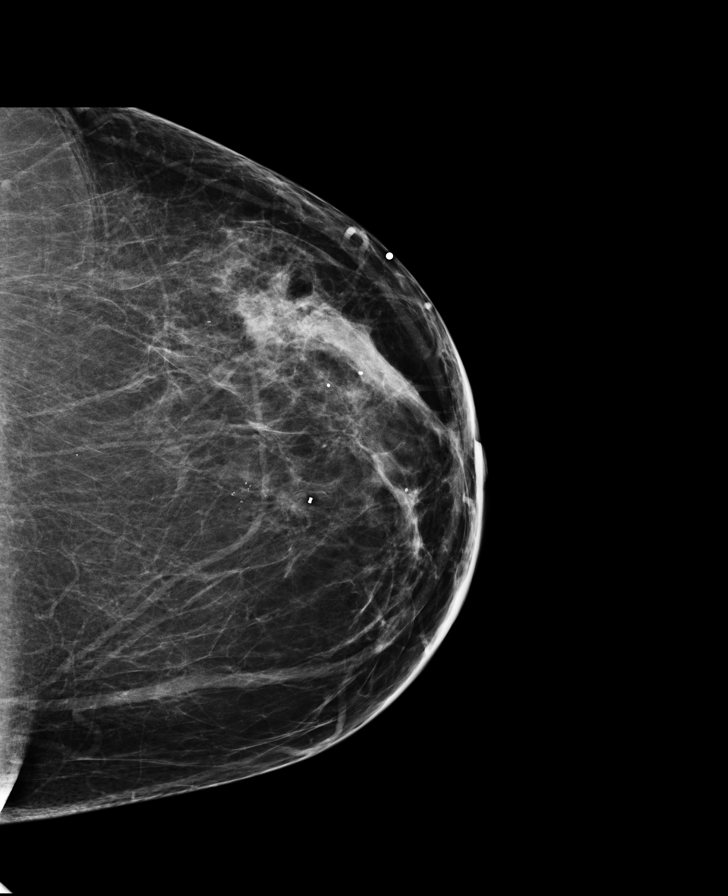

[L MLO (1 of 2)]
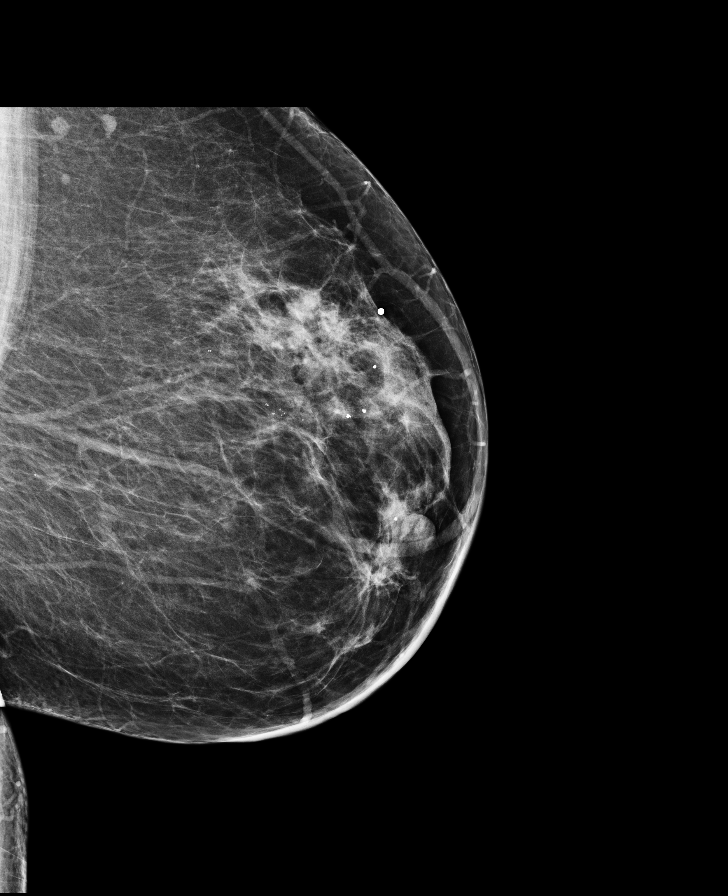

[R CC]
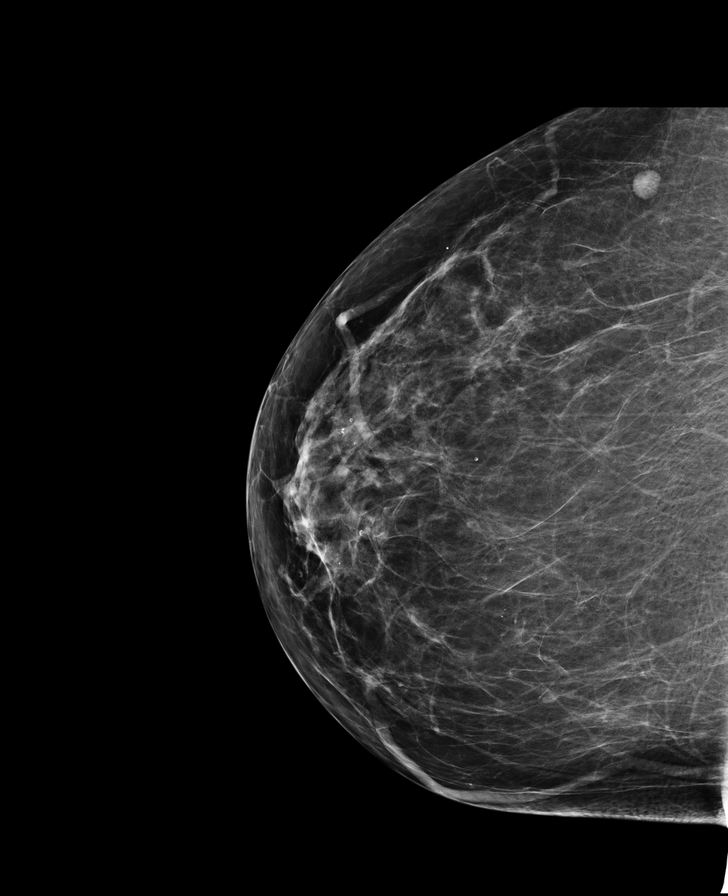

[R MLO]
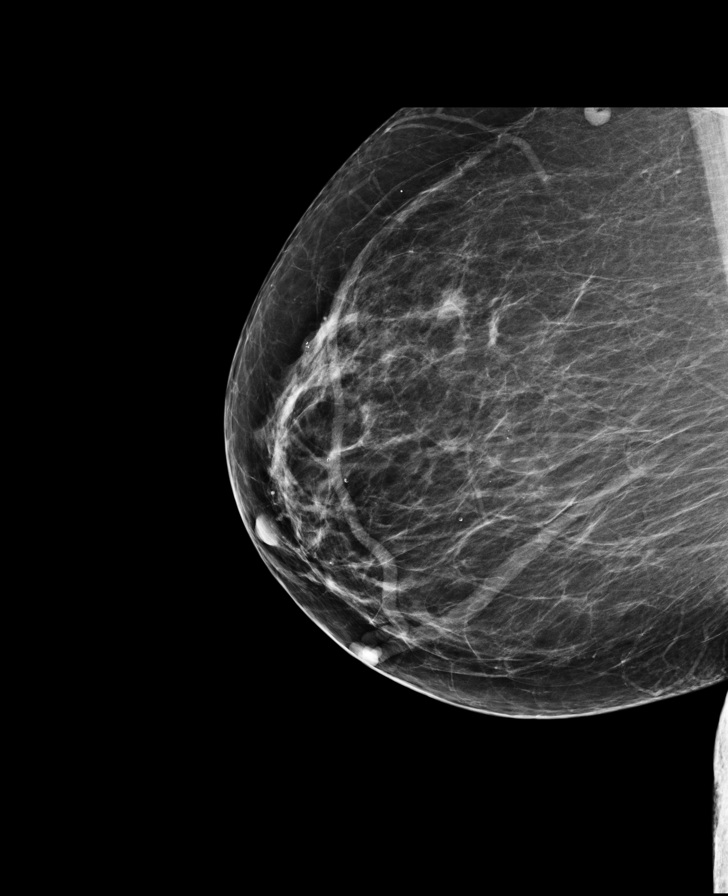

[L TAN]
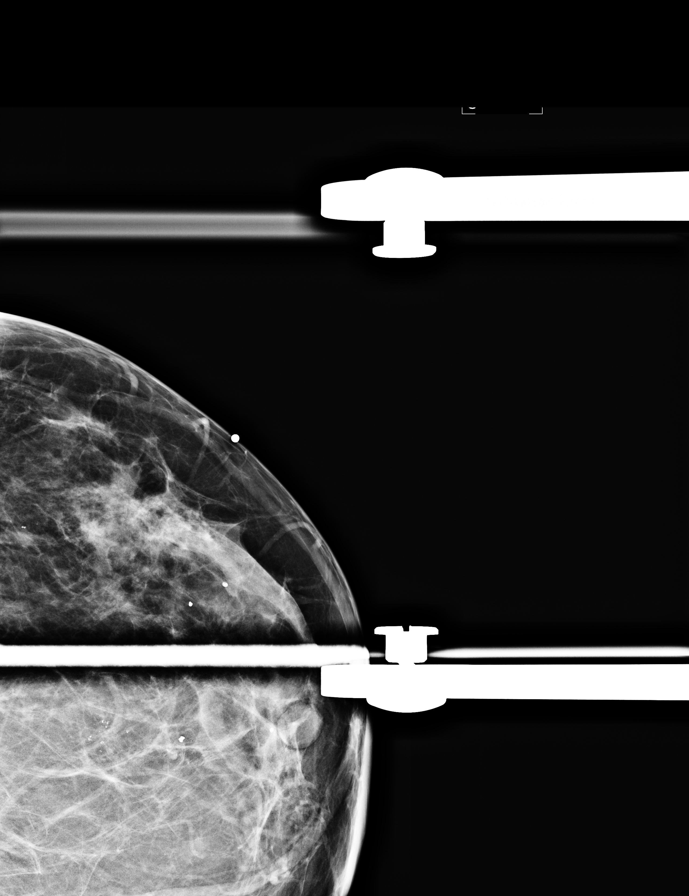

[L CC (2 of 2)]
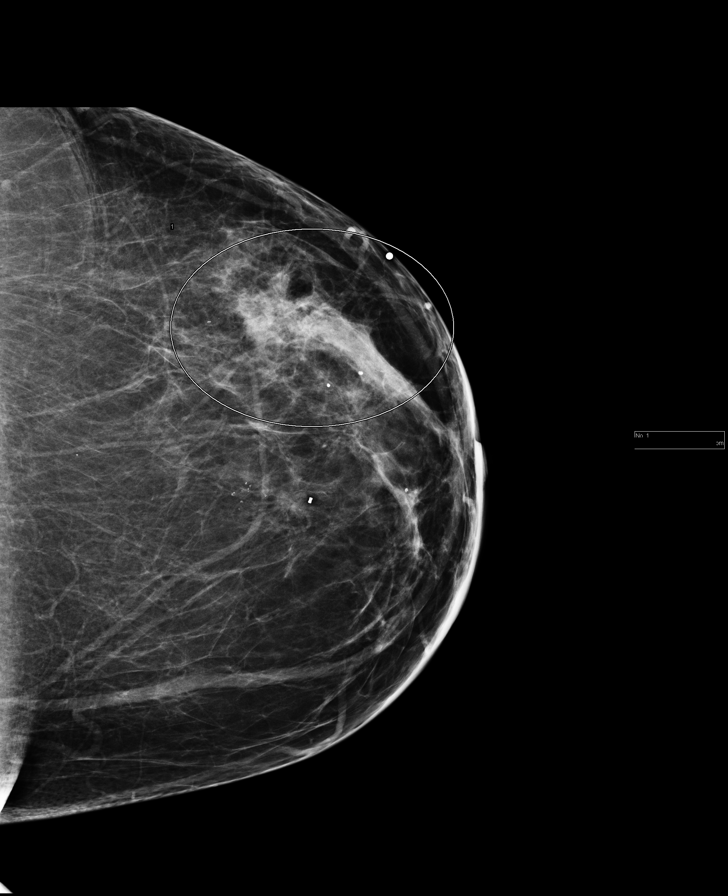

[L MLO (2 of 2)]
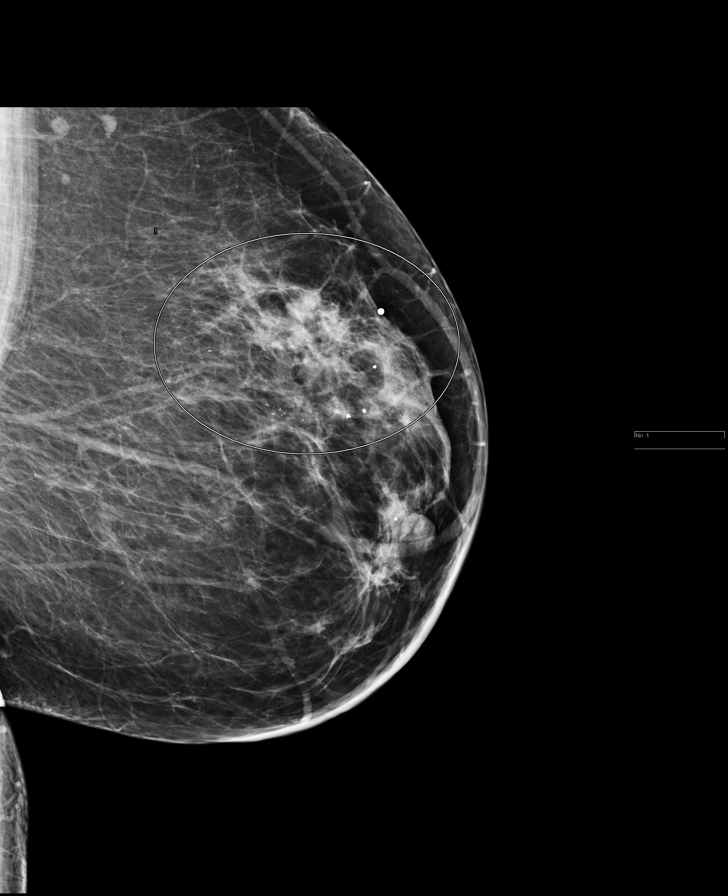

[7 of 7 positions shown; findings below may reference images not displayed]

ACR Breast Density Category b: There are scattered areas of
fibroglandular density.
FINDINGS: Interval approximately 6 x 3 x 2.5 cm area of ill-defined increased
density in the upper outer left breast, corresponding to the mass
felt by the patient, marked with a metallic marker. This is not at
the location of the previously biopsied benign calcifications. No
findings elsewhere in either breast suspicious for malignancy.

Mammographic images were processed with CAD.

On physical exam, the patient has an approximately 2.5 x 1.5 cm oval
area of irregular, palpable soft tissue thickening in the 2 o'clock
position of the left breast, centered 7 cm from the nipple. There
are no palpable left axillary lymph nodes.

Ultrasound is performed, showing an ill-defined predominantly
hypoechoic area with some increased echogenicity in the 2 o'clock
position of the left breast, 7 cm from the nipple. This has
irregular margins and measures approximately 2.6 x 2.3 x 1.8 cm in
maximum dimensions. At the anterior, superolateral aspect of this
abnormality, this is slightly more focally hypoechoic and irregular,
measuring 9 x 7 x 6 mm.

Ultrasound of the left axilla demonstrated a normal appearing left
axillary lymph node with no abnormal appearing nodes seen.
IMPRESSION: Irregular, heterogeneous mass-like area in the 2 o'clock position of
the left breast, corresponding to the palpable mass, as described
above. This has imaging features suspicious for malignancy.

RECOMMENDATION:
Ultrasound-guided core needle biopsy of the irregular mass-like area
in the 2 o'clock position of the left breast. This has been
discussed with the patient and scheduled for 1 p.m. on [DATE].

I have discussed the findings and recommendations with the patient.
Results were also provided in writing at the conclusion of the
visit. If applicable, a reminder letter will be sent to the patient
regarding the next appointment.

BI-RADS CATEGORY  5: Highly suggestive of malignancy.

## 2014-07-01 ENCOUNTER — Ambulatory Visit (HOSPITAL_COMMUNITY)
Admission: RE | Admit: 2014-07-01 | Discharge: 2014-07-01 | Disposition: A | Discharge status: Home / Self Care | Payer: 59 | Source: Ambulatory Visit | Attending: Obstetrics and Gynecology | Admitting: Obstetrics and Gynecology

## 2014-07-01 ENCOUNTER — Ambulatory Visit (HOSPITAL_COMMUNITY): Payer: 59

## 2014-07-01 ENCOUNTER — Other Ambulatory Visit: Payer: Self-pay | Admitting: Obstetrics and Gynecology

## 2014-07-01 DIAGNOSIS — N6320 Unspecified lump in the left breast, unspecified quadrant: Secondary | ICD-10-CM

## 2014-07-01 DIAGNOSIS — N63 Unspecified lump in breast: Secondary | ICD-10-CM | POA: Diagnosis not present

## 2014-07-01 HISTORY — PX: BREAST BIOPSY: SHX20

## 2014-07-01 IMAGING — US US LT BREAST BX W LOC DEV 1ST LESION IMG BX SPEC US GUIDE
1 series · 13 of 15 positions shown · non-contrast
Comparison: Previous exams.

ADDENDUM:
Pathology for the ultrasound-guided core needle biopsy of the left
breast is reported as invasive mammary carcinoma with lobular
features.

Please refer to pathology report for further details. The malignant
histology is concordant with pre biopsy imaging. Recommend
consultation with a breast surgeon. These results and
recommendations were discussed with the patient by telephone at
[DATE] a.m. and her sister at [DATE] p.m. on [DATE]. The patient
has a tentative follow-up appointment with the breast surgeon on
[DATE] at [DATE] p.m.
CLINICAL DATA: Left breast mass.
EXAM:
ULTRASOUND GUIDED left BREAST CORE NEEDLE BIOPSY

[Series 1: us left breast bx w loc dev 1st lesion img bx spec · 0.06mm/px · 13 of 15 slices shown]
[im 1/15]
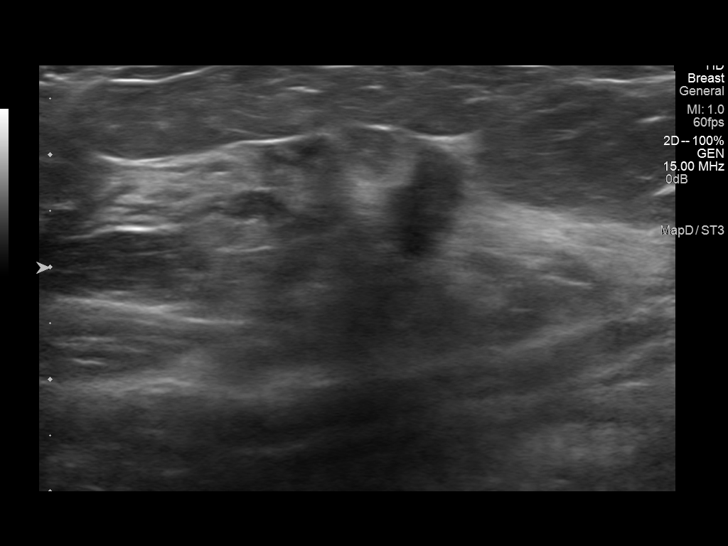
[im 2/15]
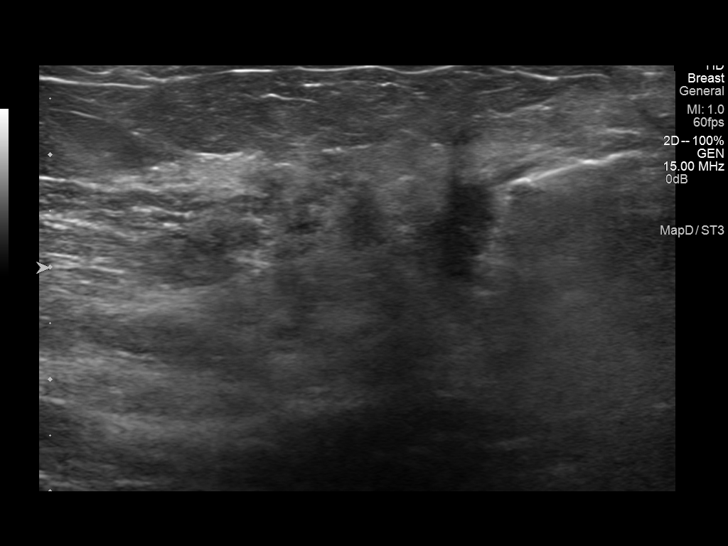
[im 3/15]
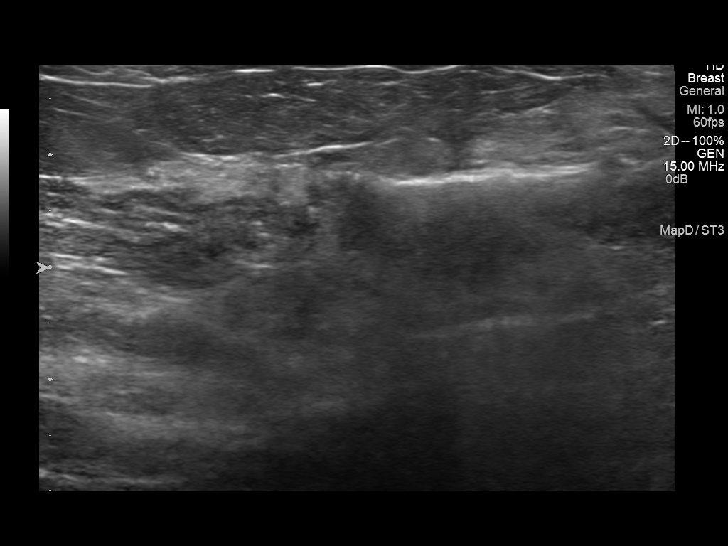
[im 5/15]
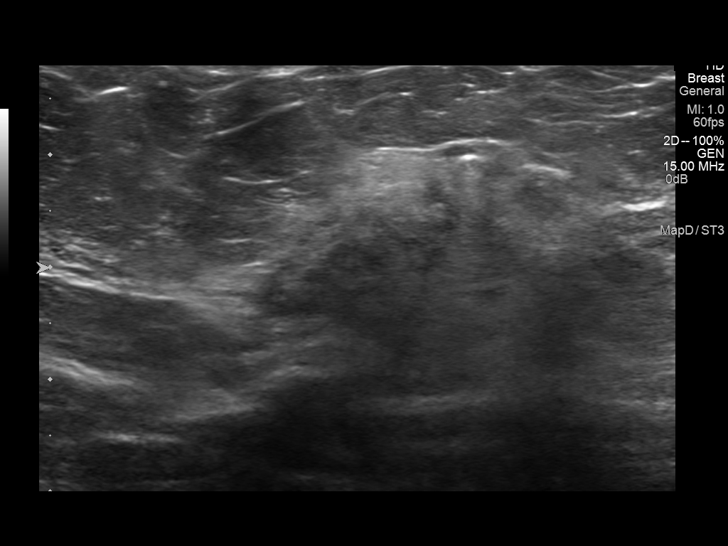
[im 6/15]
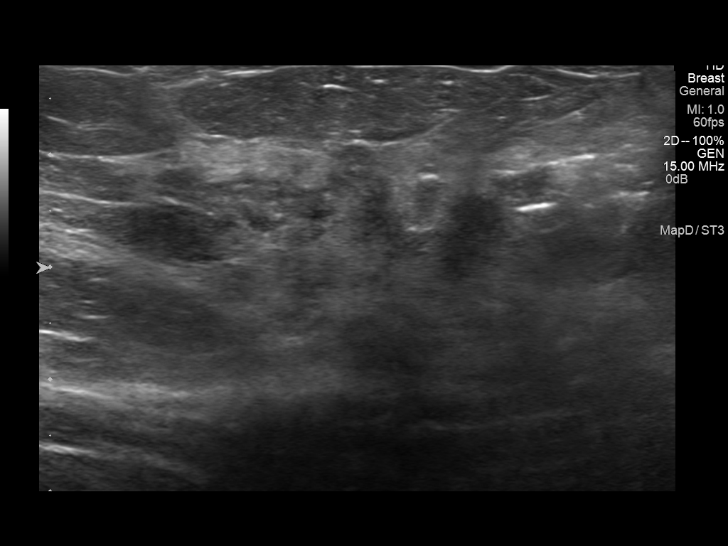
[im 7/15]
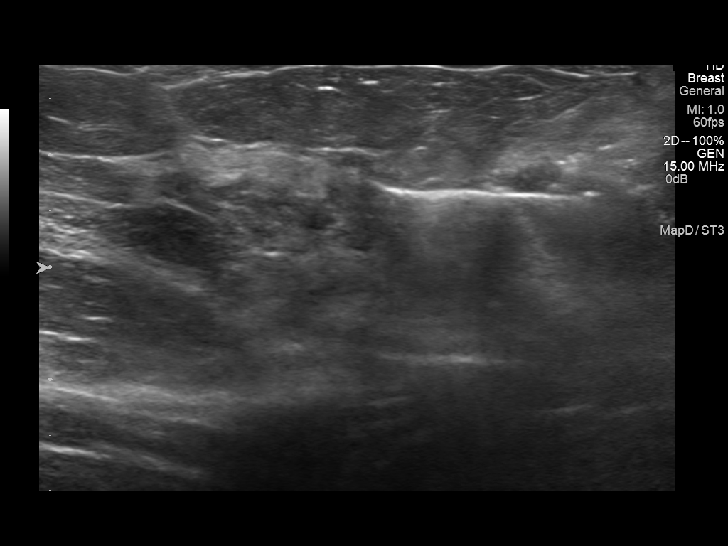
[im 8/15]
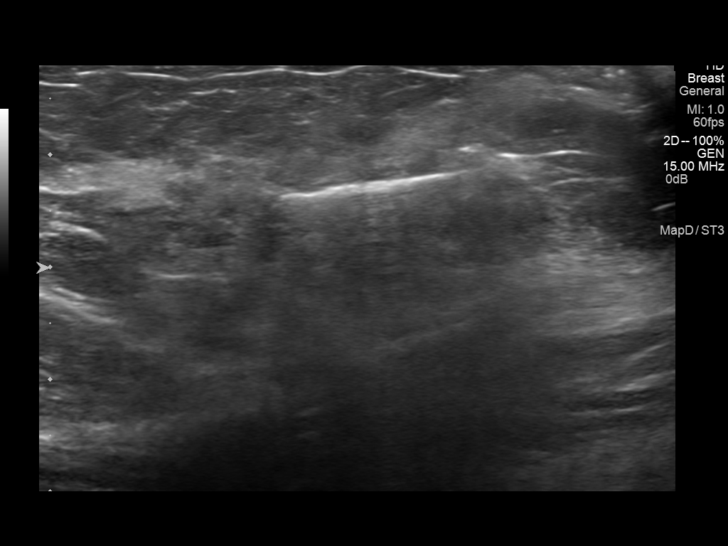
[im 9/15]
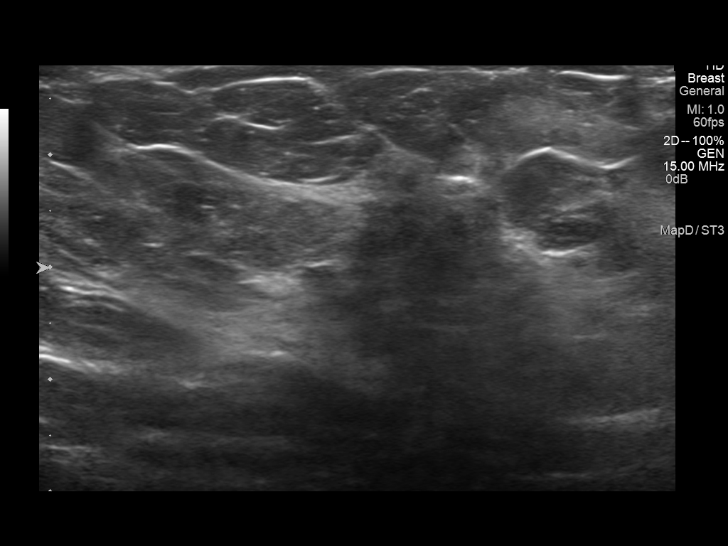
[im 10/15]
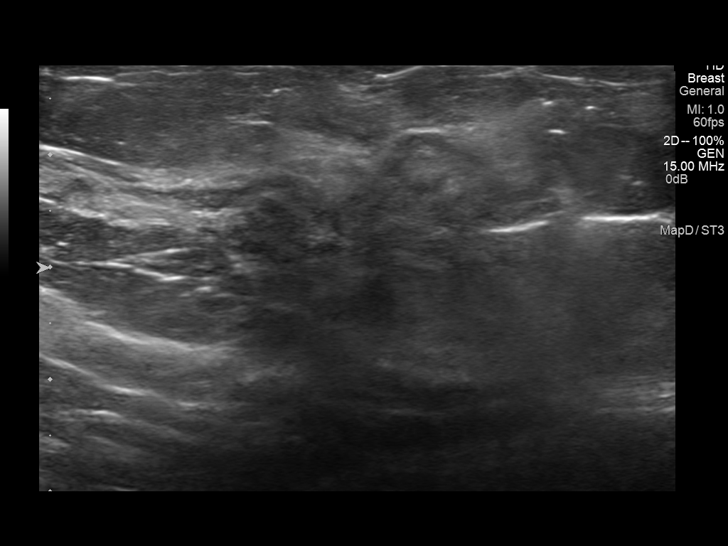
[im 11/15]
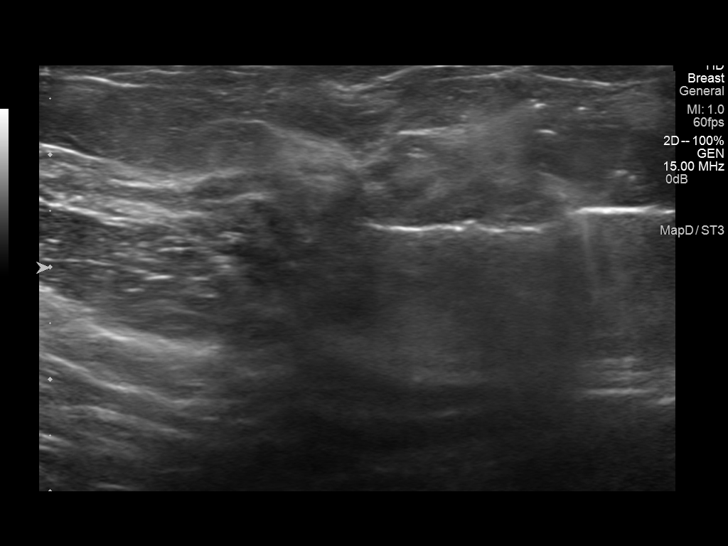
[im 13/15]
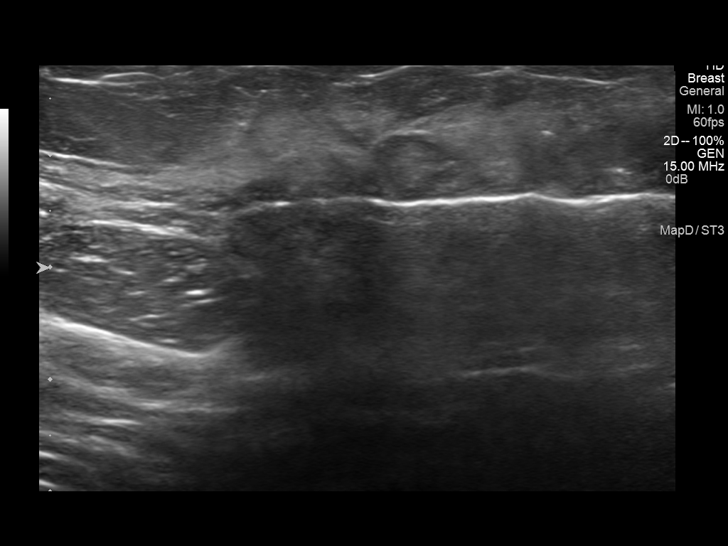
[im 14/15]
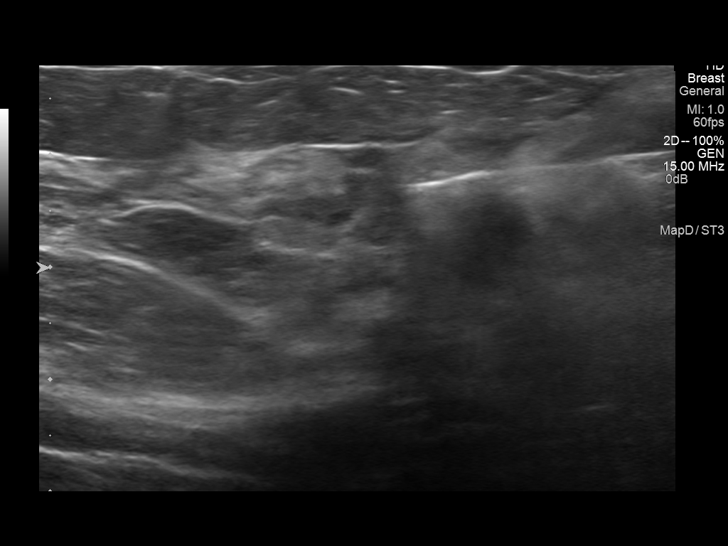
[im 15/15]
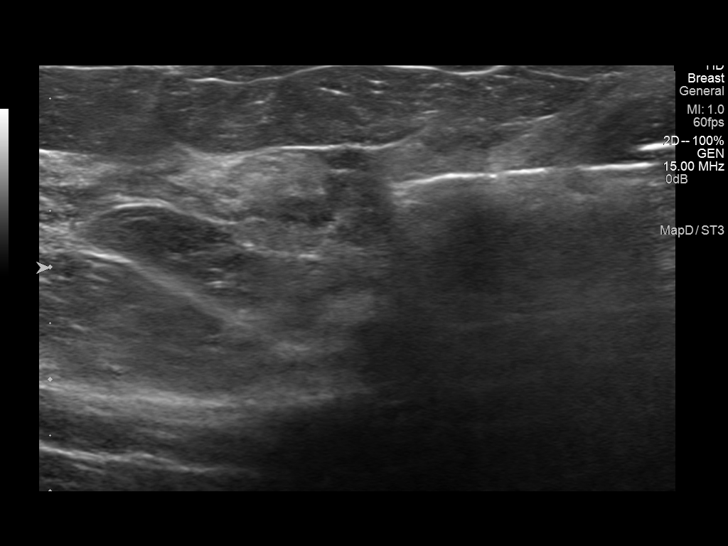

[13 of 15 positions shown; findings below may reference images not displayed]

PROCEDURE:
I met with the patient and we discussed the procedure of
ultrasound-guided biopsy, including benefits and alternatives. We
discussed the high likelihood of a successful procedure. We
discussed the risks of the procedure including infection, bleeding,
tissue injury, clip migration, and inadequate sampling. Informed
written consent was given. The usual time-out protocol was performed
immediately prior to the procedure.

Using sterile technique and 2% Lidocaine as local anesthetic, under
direct ultrasound visualization, a 12 gauge vacuum assisteddevice
was used to perform biopsy of the mass located within the left
breast at the 2 o'clock position using a lateral approach. At the
conclusion of the procedure, a wing shaped tissue marker clip was
deployed into the biopsy cavity. Follow-up 2-view mammogram was
performed and dictated separately.
IMPRESSION: Ultrasound-guided biopsy of the left breast mass located at 2
o'clock position as discussed above. No apparent complications.

## 2014-07-01 IMAGING — MG MM DIGITAL DIAGNOSTIC UNILAT*L*
2 series · 2 of 2 positions shown · non-contrast
Comparison: Previous exams

CLINICAL DATA: Post left breast ultrasound-guided biopsy.

EXAM:
DIAGNOSTIC left breast MAMMOGRAM POST ultrasound BIOPSY

[L CC]
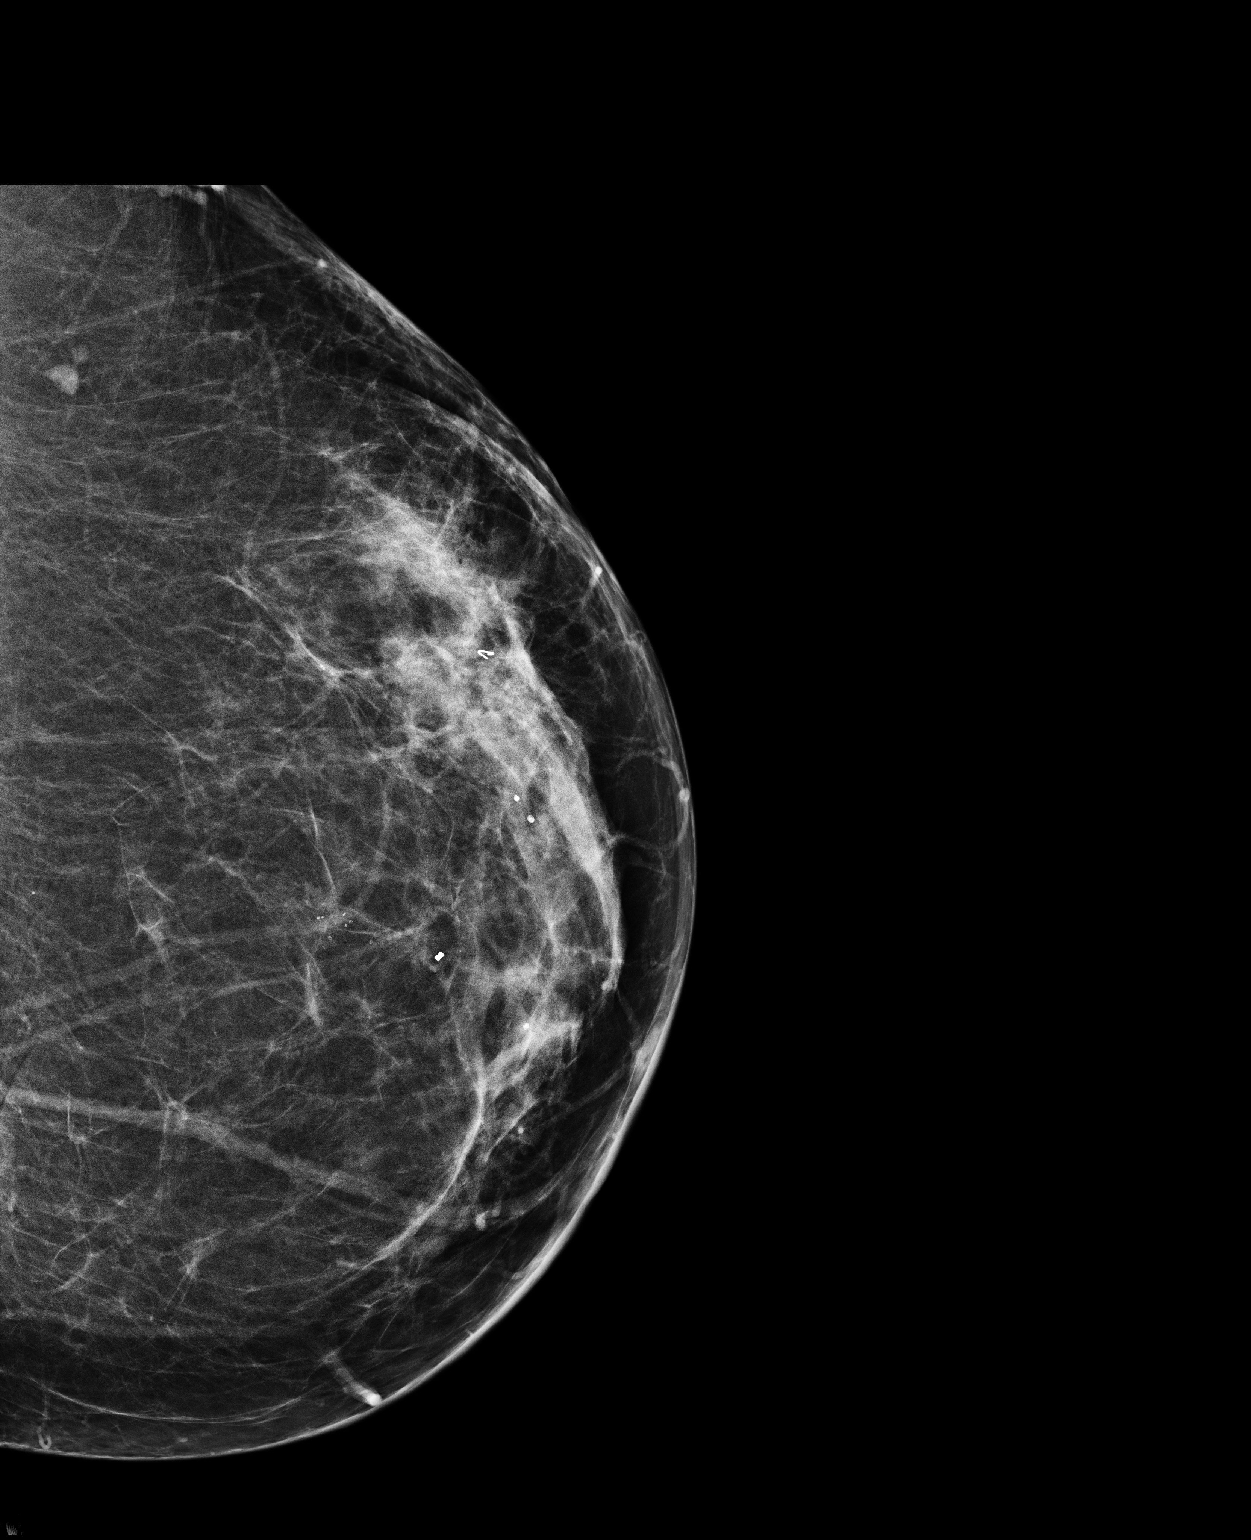

[L ML]
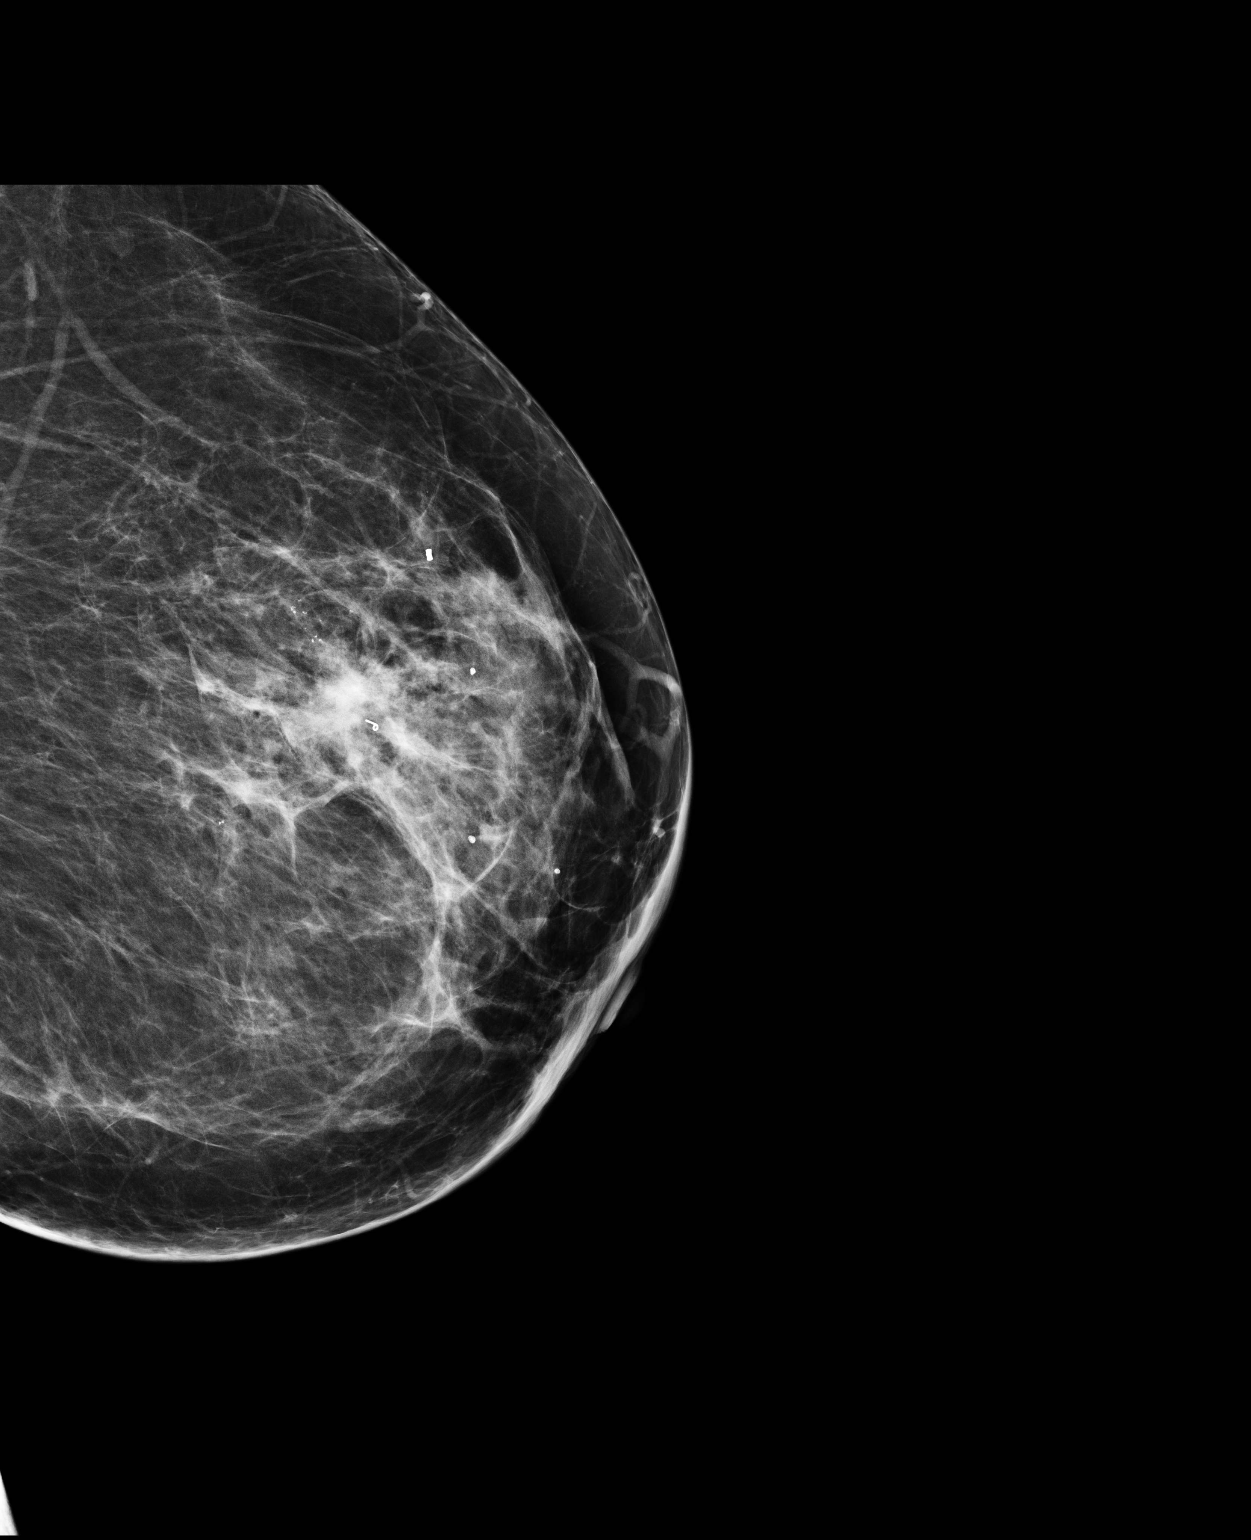

[2 of 2 positions shown; findings below may reference images not displayed]

FINDINGS: Mammographic images were obtained following ultrasound guided biopsy
of the mass located within the left breast at 2 o'clock position.
The wing shaped clip is in appropriate position.
IMPRESSION: Appropriate positioning of clip following left breast
ultrasound-guided core biopsy.

Final Assessment: Post Procedure Mammograms for Marker Placement

## 2014-07-01 MED ORDER — LIDOCAINE HCL (PF) 2 % IJ SOLN
INTRAMUSCULAR | Status: AC
  Administered 2014-07-01: 200 mg
  Filled 2014-07-01: qty 10

## 2014-07-01 NOTE — Sedation Documentation (Signed)
Samples collected, labeled at bedside, sent for analysis. Pt tolerated procedure well.

## 2014-07-01 NOTE — Discharge Instructions (Signed)
Breast Biopsy °Care After °These instructions give you information on caring for yourself after your procedure. Your doctor may also give you more specific instructions. Call your doctor if you have any problems or questions after your procedure. °HOME CARE °· Only take medicine as told by your doctor. °· Do not take aspirin. °· Keep your sutures (stitches) dry when bathing. °· Protect the biopsy area. Do not let the area get bumped. °· Avoid activities that could pull the biopsy site open until your doctor approves. This includes: °· Stretching. °· Reaching. °· Exercise. °· Sports. °· Lifting more than 3lb. °· Continue your normal diet. °· Wear a good support bra for as long as told by your doctor. °· Change any bandages (dressings) as told by your doctor. °· Do not drink alcohol while taking pain medicine. °· Keep all doctor visits as told. Ask when your test results will be ready. Make sure you get your test results. °GET HELP RIGHT AWAY IF:  °· You have a fever. °· You have more bleeding (more than a small spot) from the biopsy site. °· You have trouble breathing. °· You have yellowish-white fluid (pus) coming from the biopsy site. °· You have redness, puffiness (swelling), or more pain in the biopsy site. °· You have a bad smell coming from the biopsy site. °· Your biopsy site opens after sutures, staples, or sticky strips have been removed. °· You have a rash. °· You need stronger medicine. °MAKE SURE YOU: °· Understand these instructions. °· Will watch your condition. °· Will get help right away if you are not doing well or get worse. °Document Released: 05/07/2009 Document Revised: 10/03/2011 Document Reviewed: 08/21/2011 °ExitCare® Patient Information ©2015 [employer]. This information is not intended to replace advice given to you by your health care provider. Make sure you discuss any questions you have with your health care provider. ° °Breast Biopsy °A breast biopsy is a test during which a sample of  tissue is taken from your breast. The breast tissue is looked at under a microscope for cancer cells.  °BEFORE THE PROCEDURE °· Make plans to have someone drive you home after the test. °· Do not smoke for 2 weeks before the test. Stop smoking, if you smoke. °· Do not drink alcohol for 24 hours before the test. °· Wear a good support bra to the test. °PROCEDURE  °You may be given one of the following: °· A medicine to numb the breast area (local anesthetic). °· A medicine to make you fall asleep (general anesthetic). °There are different types of breast biopsies. They include: °· Fine-needle aspiration. °¨ A needle is put into the breast lump. °¨ The needle takes out fluid and cells from the lump. °¨ Ultrasound imaging may be used to help find the lump and to put the needle in the right spot. °· Core-needle biopsy. °¨ A needle is put into the breast lump. °¨ The needle is put in your breast 3-6 times. °¨ The needle removes breast tissue. °¨ An ultrasound image or X-ray is often used to find the right spot to put in the needle. °· Stereotactic biopsy. °¨ X-rays and a computer are used to study X-ray pictures of the breast lump. °¨ The computer finds where the needle needs to be put into the breast. °¨ Tissue samples are taken out. °· Vacuum-assisted biopsy. °¨ A small cut (incision) is made in your breast. °¨ A biopsy device is put through the cut and into the breast   tissue. °¨ The biopsy device draws abnormal breast tissue into the biopsy device. °¨ A large tissue sample is often removed. °¨ No stitches are needed. °· Ultrasound-guided core-needle biopsy. °¨ Ultrasound imaging helps guide the needle into the area of the breast that is not normal. °¨ A cut is made in the breast. The needle is put into the breast lump. °¨ Tissue samples are taken out. °· Open biopsy. °¨ A large cut is made in the breast. °¨ Your doctor will try to remove the whole breast lump or as much as possible. °All tissue, fluid, or cell samples  are looked at under a microscope.  °AFTER THE PROCEDURE °· You will be taken to an area to recover. You will be able to go home once you are doing well and are without problems. °· You may have bruising on your breast. This is normal. °· A pressure bandage (dressing) may be put on your breast for 24-48 hours. This type of bandage is wrapped tightly around your chest. It helps stop fluid from building up underneath tissues. °Document Released: 10/03/2011 Document Revised: 11/25/2013 Document Reviewed: 10/03/2011 °ExitCare® Patient Information ©2015 [employer]. This information is not intended to replace advice given to you by your health care provider. Make sure you discuss any questions you have with your health care provider. ° °

## 2014-07-01 NOTE — Sedation Documentation (Signed)
Pt denies allergies, states does not take blood thinners.

## 2014-07-07 ENCOUNTER — Ambulatory Visit (INDEPENDENT_AMBULATORY_CARE_PROVIDER_SITE_OTHER): Payer: Self-pay | Admitting: Surgery

## 2014-07-07 DIAGNOSIS — C50912 Malignant neoplasm of unspecified site of left female breast: Secondary | ICD-10-CM

## 2014-07-07 NOTE — H&P (Signed)
Regina Mcmahon 07/07/2014 2:41 PM Location: [facility] Patient #: [id number] DOB: 1950/08/22 Single / Language: [name] / Race: White Female History of Present Illness Regina Mcmahon A. [name] MD; 07/07/2014 3:48 PM) Patient words: lt. breast check  Patient sent at the request of Dr. [name] of the [facility] for left breast cancer. Patient noticed mass in left breast upper outer quadrant 1 month ago. 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 Patient developed fullness in the left breast that she detected. Mammography in an option performed with biopsy which shows a low-grade invasive mammary carcinoma favoring lobular. Receptors pending. Patient has some soreness of the biopsy site in her left breast. No history of nipple drainage. She states her nipple was inverted but this has improved since the biopsy.       CLINICAL DATA: Mass with tenderness felt by the patient in the upper outer left breast.  EXAM: DIGITAL DIAGNOSTIC BILATERAL MAMMOGRAM WITH CAD  ULTRASOUND LEFT BREAST  COMPARISON: Previous examinations, the most recent dated 11/14/2012, 11/07/2012 and 10/23/2012.  ACR Breast Density Category b: There are scattered areas of fibroglandular density.  FINDINGS: Interval approximately 6 x 3 x 2.5 cm area of ill-defined increased density in the upper outer left breast, corresponding to the mass felt by the patient, marked with a metallic marker. This is not at the location of the previously biopsied benign calcifications. No findings elsewhere in either breast suspicious for malignancy.  Mammographic images were processed with CAD.  On physical exam, the patient has an approximately 2.5 x 1.5 cm oval area of irregular, palpable soft tissue thickening in the 2 o'clock position of the left breast, centered 7 cm from the nipple. There are no palpable left axillary lymph  nodes.  Ultrasound is performed, showing an ill-defined predominantly hypoechoic area with some increased echogenicity in the 2 o'clock position of the left breast, 7 cm from the nipple. This has irregular margins and measures approximately 2.6 x 2.3 x 1.8 cm in maximum dimensions. At the anterior, superolateral aspect of this abnormality, this is slightly more focally hypoechoic and irregular, measuring 9 x 7 x 6 mm.  Ultrasound of the left axilla demonstrated a normal appearing left axillary lymph node with no abnormal appearing nodes seen.  IMPRESSION: Irregular, heterogeneous mass-like area in the 2 o'clock position of the left breast, corresponding to the palpable mass, as described above. This has imaging features suspicious for malignancy.  RECOMMENDATION: Ultrasound-guided core needle biopsy of the irregular mass-like area in the 2 o'clock position of the left breast. This has been discussed with the patient and scheduled for 1 p.m. on 07/01/2014.  I have discussed the findings and recommendations with the patient. Results were also provided in writing at the conclusion of the visit. If applicable, a reminder letter will be sent to the patient regarding the next appointment.  BI-RADS CATEGORY 5: Highly suggestive of malignancy.   Electronically Signed By: Enrique Sack M.D.          Diagnosis Breast, left, needle core biopsy, 2 o'clock INVASIVE MAMMARY CARCINOMA, PLEASE SEE COMMENT. Microscopic Comment Although the type and grade is best determined at the time of excision, there is an invasive mammary carcinoma with lobular features that appears to be low grade. An E-cadherin will be performed to further classify this malignancy and an addendum will follow. There is no evidence of angiolymphatic invasion identified in this material. The longest length involved by tumor measures 1.0  cm from the glass slide. A breast prognostic profile will be performed and an  addendum will follow. [name] MD.  The patient is a 63 year old female   Other Problems Briant Cedar, [place]; 07/07/2014 2:41 PM) Anxiety Disorder Asthma Breast Cancer Cholelithiasis Chronic Obstructive Lung Disease Depression High blood pressure Lump In Breast  Past Surgical History Briant Cedar, [place]; 07/07/2014 2:41 PM) Breast Biopsy Left. Cesarean Section - 1 Colon Polyp Removal - Colonoscopy Gallbladder Surgery - Laparoscopic  Diagnostic Studies History Briant Cedar, [place]; 07/07/2014 2:41 PM) Colonoscopy 5-10 years ago Mammogram within last year Pap Smear 1-5 years ago  Allergies Briant Cedar, [place]; 07/07/2014 2:42 PM) No Known Drug Allergies 07/07/2014  Medication History Briant Cedar, CMA; 07/07/2014 2:42 PM) Lisinopril-Hydrochlorothiazide (10-12.5MG  Tablet, Oral) Active.  Social History Briant Cedar, [place]; 07/07/2014 2:41 PM) Caffeine use Carbonated beverages. No alcohol use No drug use Tobacco use Current every day smoker.  Family History Briant Cedar, [place]; 07/07/2014 2:41 PM) Colon Cancer Brother. Diabetes Mellitus Mother. Heart disease in female family member before age 44 Heart disease in female family member before age 23 Ovarian Cancer Mother.  Pregnancy / Birth History Briant Cedar, CMA; 07/07/2014 2:41 PM) Age at menarche 43 years. Age of menopause <47 Contraceptive History Oral contraceptives. Gravida 2 Irregular periods Maternal age 32-20 Para 2     Review of Systems Briant Cedar CMA; 07/07/2014 2:41 PM) General Present- Fatigue. Not Present- Appetite Loss, Chills, Fever, Night Sweats, Weight Gain and Weight Loss. HEENT Present- Seasonal Allergies. Not Present- Earache, Hearing Loss, Hoarseness, Nose Bleed, Oral Ulcers, Ringing in the Ears, Sinus Pain, Sore Throat, Visual Disturbances, Wears glasses/contact lenses and Yellow Eyes. Respiratory Present- Chronic Cough,  Difficulty Breathing, Snoring and Wheezing. Not Present- Bloody sputum. Breast Present- Breast Mass and Breast Pain. Not Present- Nipple Discharge and Skin Changes. Cardiovascular Present- Chest Pain, Difficulty Breathing Lying Down, Rapid Heart Rate, Shortness of Breath and Swelling of Extremities. Not Present- Leg Cramps and Palpitations. Gastrointestinal Present- Chronic diarrhea, Hemorrhoids, Indigestion and Rectal Pain. Not Present- Abdominal Pain, Bloating, Bloody Stool, Change in Bowel Habits, Constipation, Difficulty Swallowing, Excessive gas, Gets full quickly at meals, Nausea and Vomiting. Female Genitourinary Present- Frequency, Nocturia and Urgency. Not Present- Painful Urination and Pelvic Pain. Musculoskeletal Present- Back Pain. Not Present- Joint Pain, Joint Stiffness, Muscle Pain, Muscle Weakness and Swelling of Extremities. Neurological Present- Weakness. Not Present- Decreased Memory, Fainting, Headaches, Numbness, Seizures, Tingling, Tremor and Trouble walking. Psychiatric Present- Anxiety, Depression, Fearful and Frequent crying. Not Present- Bipolar and Change in Sleep Pattern. Hematology Present- Easy Bruising. Not Present- Excessive bleeding, Gland problems, HIV and Persistent Infections.  Vitals Briant Cedar CMA; 07/07/2014 2:43 PM) 07/07/2014 2:42 PM Weight: 224 lb Height: 65in Body Surface Area: 2.16 m Body Mass Index: 37.28 kg/m Temp.: 97.19F  Pulse: 94 (Regular)  BP: 158/90 (Sitting, Left Arm, Standard)     Physical Exam (Carlisa Eble A. Louan Base MD; 07/07/2014 3:50 PM)  General Mental Status-Alert. General Appearance-Consistent with stated age. Hydration-Well hydrated. Voice-Normal.  Head and Neck Head-normocephalic, atraumatic with no lesions or palpable masses. Trachea-midline. Thyroid Gland Characteristics - normal size and consistency.  Chest and Lung Exam Auscultation Breath sounds - Hissing sound - Both Lung  Fields.  Breast Note: 2 cm mobile left breast UOQ bruising and swelling from biopsy site. no nipple discharge. right breast normal without mass   Cardiovascular Cardiovascular examination reveals -normal heart sounds, regular rate and rhythm with no murmurs and normal pedal pulses bilaterally.  Neurologic Neurologic evaluation reveals -  alert and oriented x 3 with no impairment of recent or remote memory. Mental Status-Normal.  Musculoskeletal Normal Exam - Left-Upper Extremity Strength Normal and Lower Extremity Strength Normal. Normal Exam - Right-Upper Extremity Strength Normal and Lower Extremity Strength Normal.  Lymphatic Head & Neck  General Head & Neck Lymphatics: Bilateral - Description - Normal. Axillary  General Axillary Region: Bilateral - Description - Normal. Tenderness - Non Tender.    Assessment & Plan (Jerrell Mangel A. Haydyn Girvan MD; 07/07/2014 3:52 PM)  BREAST CANCER, LEFT (174.9  C50.912) Impression: discussed mastectomy with reconstruction vs left breast seed localized lumpectomy with SLN mapping. pro and cons of each discussed. Risk and benefits as well as other possible treatments discussed. She would like to proceed with breast conservation. Risk of lumpectomy include bleeding, infection, seroma, more surgery, use of seed/wire, wound care, cosmetic deformity and the need for other treatments, death , blood clots, death. Pt agrees to proceed. Risk of sentinel lymph node mapping include bleeding, infection, lymphedema, shoulder pain. stiffness, dye allergy. cosmetic deformity , blood clots, death, need for more surgery. Pt agres to proceed.  Current Plans Referred to Genetic Counseling, for evaluation and follow up (Medical Genetics). Referred to Oncology, for evaluation and follow up (Oncology). Referred to Radiation Oncology, for evaluation and follow up (Radiation Oncology). Referred to Physical Therapy, for evaluation and follow up (Physical Therapy). Pt  Education - Breast Cancer in Women *: breast cancer Pt Education - Breast Cancer in Women *: tumors Pt Education - Breast Cancer: Follow-up After Surgery: breast CCS Consent - BASIC (Gross): discussed with patient and provided information. Pt Education - CSS Breast Biopsy Instructions (FLB): discussed with patient and provided information

## 2014-07-25 DIAGNOSIS — Z923 Personal history of irradiation: Secondary | ICD-10-CM

## 2014-07-25 DIAGNOSIS — T8859XA Other complications of anesthesia, initial encounter: Secondary | ICD-10-CM

## 2014-07-25 DIAGNOSIS — Z9221 Personal history of antineoplastic chemotherapy: Secondary | ICD-10-CM

## 2014-07-25 HISTORY — DX: Personal history of irradiation: Z92.3

## 2014-07-25 HISTORY — DX: Other complications of anesthesia, initial encounter: T88.59XA

## 2014-07-25 HISTORY — DX: Personal history of antineoplastic chemotherapy: Z92.21

## 2014-08-13 ENCOUNTER — Encounter (HOSPITAL_BASED_OUTPATIENT_CLINIC_OR_DEPARTMENT_OTHER): Payer: Self-pay | Admitting: *Deleted

## 2014-08-13 NOTE — Progress Notes (Signed)
   08/13/14 1526  OBSTRUCTIVE SLEEP APNEA  Have you ever been diagnosed with sleep apnea through a sleep study? No  Do you snore loudly (loud enough to be heard through closed doors)?  1  Do you often feel tired, fatigued, or sleepy during the daytime? 0  Has anyone observed you stop breathing during your sleep? 0  Do you have, or are you being treated for high blood pressure? 1  BMI more than 35 kg/m2? 1  Age over 62 years old? 1  Gender: 0  Obstructive Sleep Apnea Score 4  Score 4 or greater  Results sent to PCP

## 2014-08-13 NOTE — Progress Notes (Signed)
To come for labs and ekg after seeds

## 2014-08-18 ENCOUNTER — Ambulatory Visit
Admission: RE | Admit: 2014-08-18 | Discharge: 2014-08-18 | Disposition: A | Discharge status: Home / Self Care | Payer: 59 | Source: Ambulatory Visit | Attending: Surgery | Admitting: Surgery

## 2014-08-18 IMAGING — MG MM LT RADIOACTIVE SEED LOC MAMMO GUIDE
8 of 9 series · 8 of 9 positions shown · non-contrast
Comparison: Previous exam(s)

CLINICAL DATA: Patient with recent diagnosis left breast invasive
mammary carcinoma. For preoperative seed localization.

EXAM:
MAMMOGRAPHIC GUIDED RADIOACTIVE SEED LOCALIZATION OF THE LEFT BREAST

[L CC (1 of 6)]
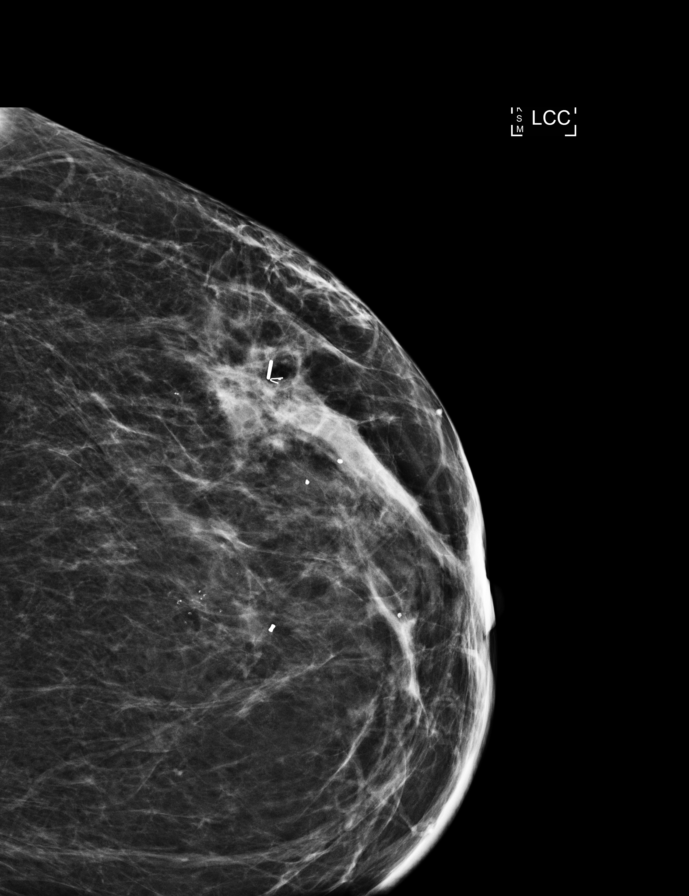

[L CC (2 of 6)]
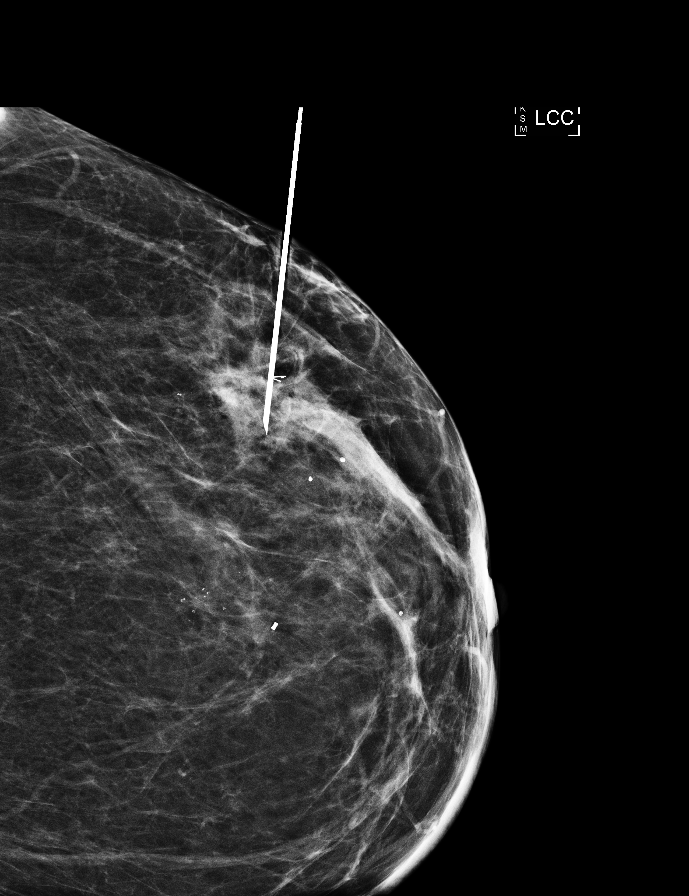

[L LM (1 of 2)]
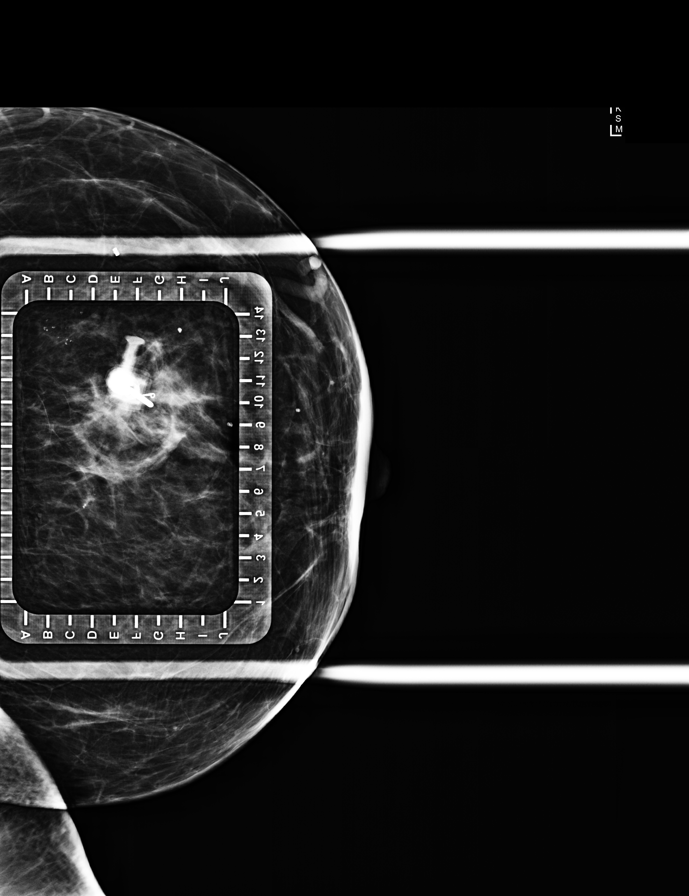

[L CC (3 of 6)]
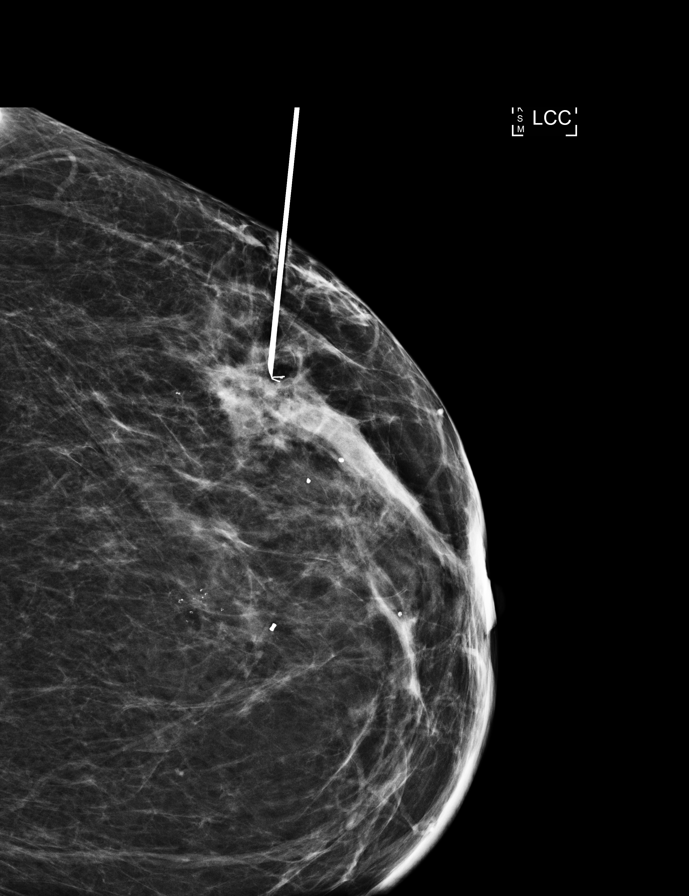

[L CC (4 of 6)]
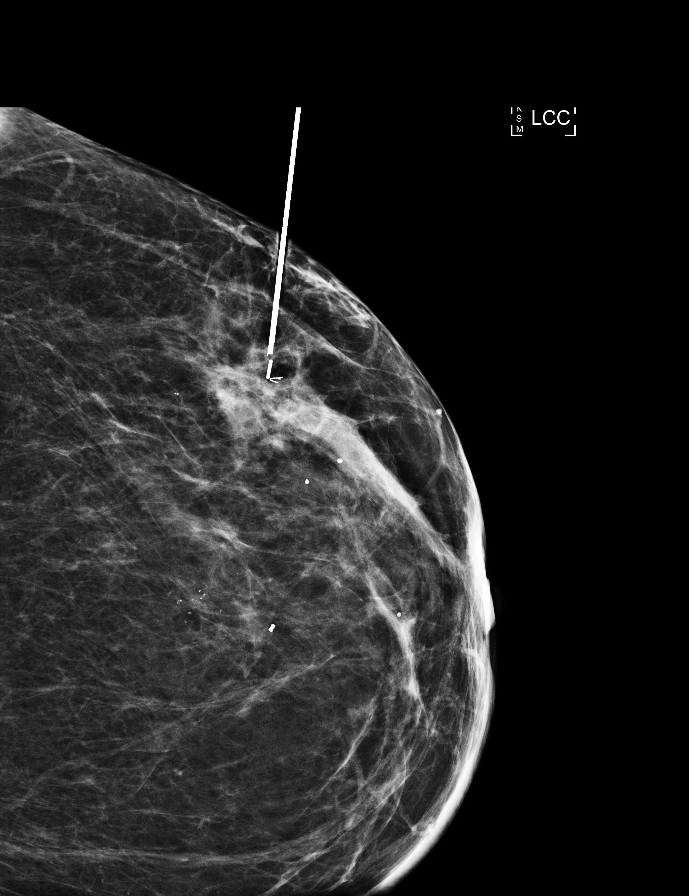

[L CC (5 of 6)]
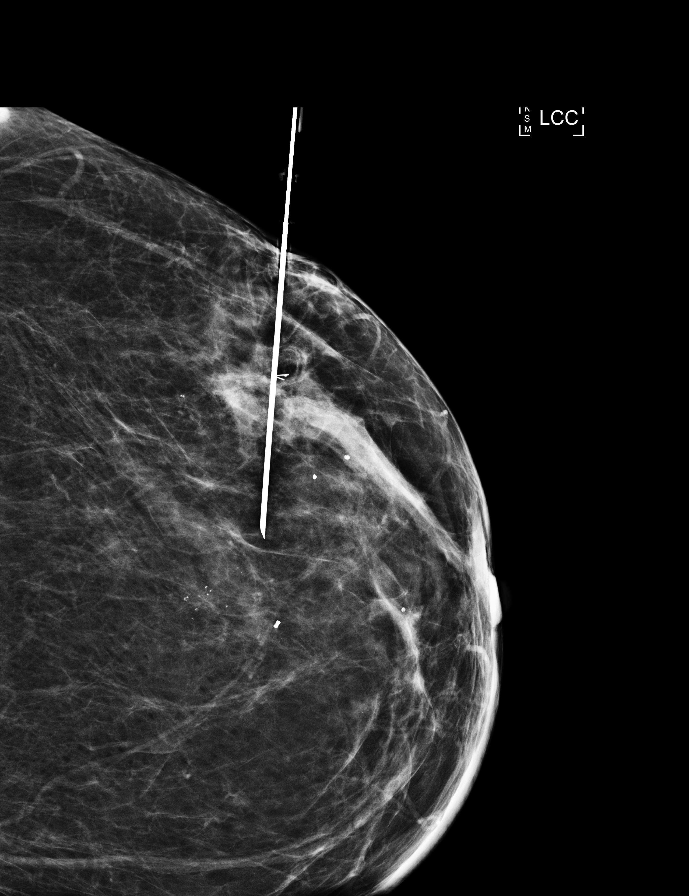

[L CC (6 of 6)]
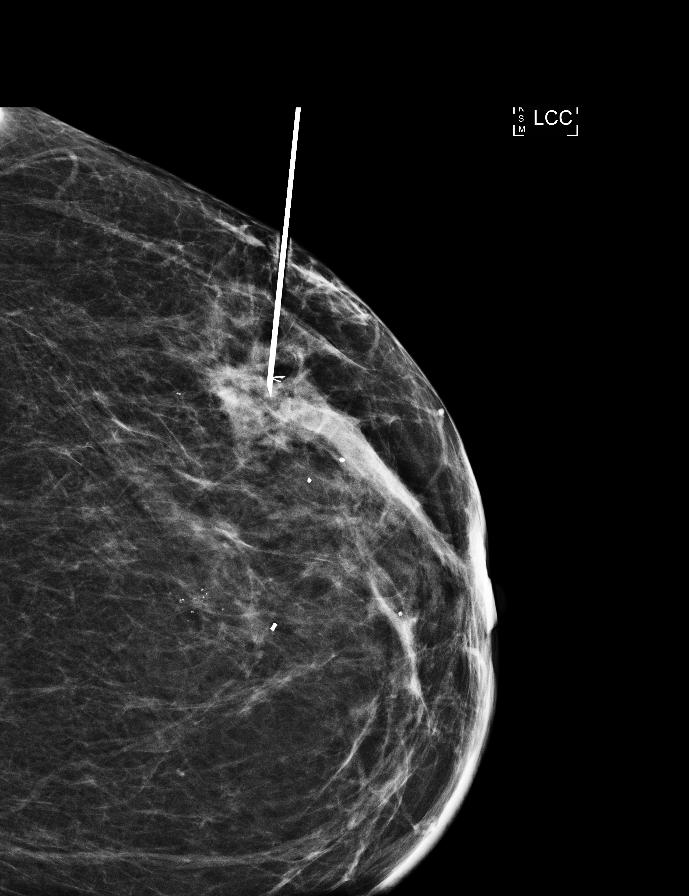

[L LM (2 of 2)]
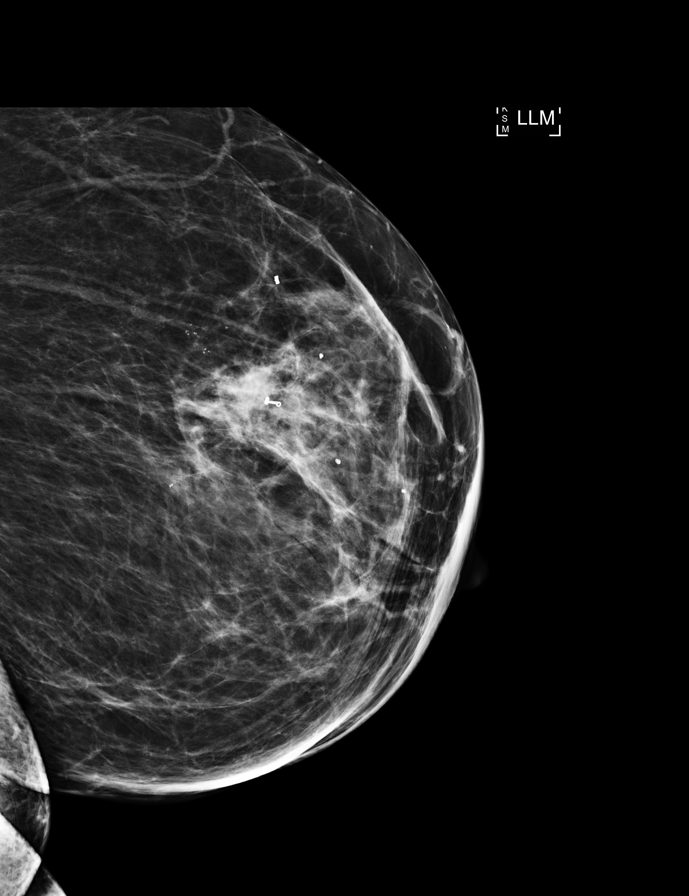

[8 of 9 positions shown; findings below may reference images not displayed]

FINDINGS: Patient presents for radioactive seed localization prior to left
breast lumpectomy. I met with the patient and we discussed the
procedure of seed localization including benefits and alternatives.
We discussed the high likelihood of a successful procedure. We
discussed the risks of the procedure including infection, bleeding,
tissue injury and further surgery. We discussed the low dose of
radioactivity involved in the procedure. Informed, written consent
was given.

The usual time-out protocol was performed immediately prior to the
procedure.

Using mammographic guidance, sterile technique, 2% lidocaine and an
[ET] radioactive seed, wing shaped marking clip and mass within the
lateral left breast were localized using a lateral approach. The
follow-up mammogram images confirm the seed in the expected location
and are marked for Dr. MOHIT.

Follow-up survey of the patient confirms presence of radioactive
seed.

Order number of [ET] seed:  [PHONE_NUMBER].

Total activity:  0.265 mCi  Reference Date: [DATE]

The patient tolerated the procedure well and was released from the
[REDACTED]. She was given instructions regarding seed removal.
IMPRESSION: Radioactive seed localization mass and wing shaped biopsy marking
clip in the left breast. No apparent complications.

## 2014-08-19 ENCOUNTER — Encounter (HOSPITAL_BASED_OUTPATIENT_CLINIC_OR_DEPARTMENT_OTHER)
Admission: RE | Admit: 2014-08-19 | Discharge: 2014-08-19 | Disposition: A | Discharge status: Home / Self Care | Payer: 59 | Source: Ambulatory Visit | Attending: Surgery | Admitting: Surgery

## 2014-08-19 DIAGNOSIS — Z79899 Other long term (current) drug therapy: Secondary | ICD-10-CM | POA: Diagnosis not present

## 2014-08-19 DIAGNOSIS — C50912 Malignant neoplasm of unspecified site of left female breast: Secondary | ICD-10-CM | POA: Diagnosis present

## 2014-08-19 DIAGNOSIS — Z720 Tobacco use: Secondary | ICD-10-CM | POA: Diagnosis not present

## 2014-08-19 DIAGNOSIS — F329 Major depressive disorder, single episode, unspecified: Secondary | ICD-10-CM | POA: Diagnosis not present

## 2014-08-19 DIAGNOSIS — Z6837 Body mass index (BMI) 37.0-37.9, adult: Secondary | ICD-10-CM | POA: Diagnosis not present

## 2014-08-19 DIAGNOSIS — J449 Chronic obstructive pulmonary disease, unspecified: Secondary | ICD-10-CM | POA: Diagnosis not present

## 2014-08-19 DIAGNOSIS — F419 Anxiety disorder, unspecified: Secondary | ICD-10-CM | POA: Diagnosis not present

## 2014-08-19 DIAGNOSIS — I10 Essential (primary) hypertension: Secondary | ICD-10-CM | POA: Diagnosis not present

## 2014-08-19 LAB — COMPREHENSIVE METABOLIC PANEL
ALT: 15 U/L (ref 0–35)
AST: 14 U/L (ref 0–37)
Albumin: 3.8 g/dL (ref 3.5–5.2)
Alkaline Phosphatase: 65 U/L (ref 39–117)
Anion gap: 8 (ref 5–15)
BUN: 15 mg/dL (ref 6–23)
CO2: 32 mmol/L (ref 19–32)
Calcium: 9.4 mg/dL (ref 8.4–10.5)
Chloride: 101 mmol/L (ref 96–112)
Creatinine, Ser: 0.76 mg/dL (ref 0.50–1.10)
GFR calc Af Amer: >90 mL/min (ref >90)
GFR calc non Af Amer: 89 mL/min — ABNORMAL LOW (ref >90)
Glucose, Bld: 107 mg/dL — ABNORMAL HIGH (ref 70–99)
Potassium: 4.3 mmol/L (ref 3.5–5.1)
Sodium: 141 mmol/L (ref 135–145)
Total Bilirubin: 2 mg/dL — ABNORMAL HIGH (ref 0.3–1.2)
Total Protein: 6.8 g/dL (ref 6.0–8.3)

## 2014-08-19 LAB — CBC WITH DIFFERENTIAL/PLATELET
Basophils Absolute: 0 10*3/uL (ref 0.0–0.1)
Basophils Relative: 0 % (ref 0–1)
Eosinophils Absolute: 0.2 10*3/uL (ref 0.0–0.7)
Eosinophils Relative: 2 % (ref 0–5)
HCT: 54 % — ABNORMAL HIGH (ref 36.0–46.0)
Hemoglobin: 18.1 g/dL — ABNORMAL HIGH (ref 12.0–15.0)
Lymphocytes Relative: 19 % (ref 12–46)
Lymphs Abs: 1.8 10*3/uL (ref 0.7–4.0)
MCH: 30.6 pg (ref 26.0–34.0)
MCHC: 33.5 g/dL (ref 30.0–36.0)
MCV: 91.2 fL (ref 78.0–100.0)
Monocytes Absolute: 0.5 10*3/uL (ref 0.1–1.0)
Monocytes Relative: 5 % (ref 3–12)
Neutro Abs: 7.2 10*3/uL (ref 1.7–7.7)
Neutrophils Relative %: 74 % (ref 43–77)
Platelets: 214 10*3/uL (ref 150–400)
RBC: 5.92 MIL/uL — ABNORMAL HIGH (ref 3.87–5.11)
RDW: 12.7 % (ref 11.5–15.5)
WBC: 9.7 10*3/uL (ref 4.0–10.5)

## 2014-08-19 NOTE — Progress Notes (Signed)
ekg cleared by dr Suzette Battiest

## 2014-08-20 ENCOUNTER — Other Ambulatory Visit (INDEPENDENT_AMBULATORY_CARE_PROVIDER_SITE_OTHER): Payer: Self-pay | Admitting: Surgery

## 2014-08-20 ENCOUNTER — Encounter (HOSPITAL_BASED_OUTPATIENT_CLINIC_OR_DEPARTMENT_OTHER): Payer: Self-pay | Admitting: Anesthesiology

## 2014-08-20 ENCOUNTER — Ambulatory Visit (HOSPITAL_BASED_OUTPATIENT_CLINIC_OR_DEPARTMENT_OTHER)
Admission: RE | Admit: 2014-08-20 | Discharge: 2014-08-20 | Disposition: A | Discharge status: Home / Self Care | Payer: 59 | Source: Ambulatory Visit | Attending: Surgery | Admitting: Surgery

## 2014-08-20 ENCOUNTER — Ambulatory Visit (HOSPITAL_BASED_OUTPATIENT_CLINIC_OR_DEPARTMENT_OTHER): Payer: 59 | Admitting: Anesthesiology

## 2014-08-20 ENCOUNTER — Encounter (HOSPITAL_BASED_OUTPATIENT_CLINIC_OR_DEPARTMENT_OTHER)
Admission: RE | Disposition: A | Discharge status: Home / Self Care | Payer: Self-pay | Source: Ambulatory Visit | Attending: Surgery

## 2014-08-20 ENCOUNTER — Ambulatory Visit
Admission: RE | Admit: 2014-08-20 | Discharge: 2014-08-20 | Disposition: A | Discharge status: Home / Self Care | Payer: 59 | Source: Ambulatory Visit | Attending: Surgery | Admitting: Surgery

## 2014-08-20 ENCOUNTER — Ambulatory Visit (HOSPITAL_COMMUNITY)
Admission: RE | Admit: 2014-08-20 | Discharge: 2014-08-20 | Disposition: A | Discharge status: Home / Self Care | Payer: 59 | Source: Ambulatory Visit | Attending: Surgery | Admitting: Surgery

## 2014-08-20 DIAGNOSIS — C50912 Malignant neoplasm of unspecified site of left female breast: Secondary | ICD-10-CM

## 2014-08-20 DIAGNOSIS — I10 Essential (primary) hypertension: Secondary | ICD-10-CM | POA: Insufficient documentation

## 2014-08-20 DIAGNOSIS — J449 Chronic obstructive pulmonary disease, unspecified: Secondary | ICD-10-CM | POA: Insufficient documentation

## 2014-08-20 DIAGNOSIS — Z6837 Body mass index (BMI) 37.0-37.9, adult: Secondary | ICD-10-CM | POA: Insufficient documentation

## 2014-08-20 DIAGNOSIS — F419 Anxiety disorder, unspecified: Secondary | ICD-10-CM | POA: Insufficient documentation

## 2014-08-20 DIAGNOSIS — F329 Major depressive disorder, single episode, unspecified: Secondary | ICD-10-CM | POA: Insufficient documentation

## 2014-08-20 DIAGNOSIS — Z79899 Other long term (current) drug therapy: Secondary | ICD-10-CM | POA: Insufficient documentation

## 2014-08-20 DIAGNOSIS — Z720 Tobacco use: Secondary | ICD-10-CM | POA: Insufficient documentation

## 2014-08-20 HISTORY — PX: RADIOACTIVE SEED GUIDED PARTIAL MASTECTOMY WITH AXILLARY SENTINEL LYMPH NODE BIOPSY: SHX6520

## 2014-08-20 HISTORY — PX: BREAST LUMPECTOMY: SHX2

## 2014-08-20 SURGERY — RADIOACTIVE SEED GUIDED PARTIAL MASTECTOMY WITH AXILLARY SENTINEL LYMPH NODE BIOPSY
Anesthesia: General | Laterality: Left

## 2014-08-20 MED ORDER — MIDAZOLAM HCL 2 MG/2ML IJ SOLN
1.0000 mg | INTRAMUSCULAR | Status: DC | PRN
  Administered 2014-08-20 (×2): 2 mg via INTRAVENOUS

## 2014-08-20 MED ORDER — MIDAZOLAM HCL 5 MG/5ML IJ SOLN
INTRAMUSCULAR | Status: DC | PRN
  Administered 2014-08-20: 2 mg via INTRAVENOUS

## 2014-08-20 MED ORDER — LACTATED RINGERS IV SOLN
INTRAVENOUS | Status: DC
  Administered 2014-08-20: 10:00:00 via INTRAVENOUS

## 2014-08-20 MED ORDER — PROMETHAZINE HCL 25 MG/ML IJ SOLN: 6.2500 mg | INTRAMUSCULAR | Status: DC | PRN

## 2014-08-20 MED ORDER — LIDOCAINE HCL (CARDIAC) 20 MG/ML IV SOLN
INTRAVENOUS | Status: DC | PRN
  Administered 2014-08-20: 10 mg via INTRAVENOUS

## 2014-08-20 MED ORDER — BUPIVACAINE-EPINEPHRINE (PF) 0.5% -1:200000 IJ SOLN
INTRAMUSCULAR | Status: DC | PRN
  Administered 2014-08-20: 30 mL

## 2014-08-20 MED ORDER — ONDANSETRON HCL 4 MG/2ML IJ SOLN
INTRAMUSCULAR | Status: DC | PRN
  Administered 2014-08-20: 4 mg via INTRAVENOUS

## 2014-08-20 MED ORDER — PROPOFOL 10 MG/ML IV BOLUS
INTRAVENOUS | Status: DC | PRN
  Administered 2014-08-20: 150 mg via INTRAVENOUS

## 2014-08-20 MED ORDER — FENTANYL CITRATE 0.05 MG/ML IJ SOLN
INTRAMUSCULAR | Status: AC
  Filled 2014-08-20: qty 2

## 2014-08-20 MED ORDER — FENTANYL CITRATE 0.05 MG/ML IJ SOLN
INTRAMUSCULAR | Status: DC | PRN
  Administered 2014-08-20 (×2): 50 ug via INTRAVENOUS

## 2014-08-20 MED ORDER — MIDAZOLAM HCL 2 MG/2ML IJ SOLN
INTRAMUSCULAR | Status: AC
  Filled 2014-08-20: qty 2

## 2014-08-20 MED ORDER — SODIUM CHLORIDE 0.9 % IJ SOLN
INTRAMUSCULAR | Status: DC | PRN
  Administered 2014-08-20: 2 mL

## 2014-08-20 MED ORDER — HYDROMORPHONE HCL 1 MG/ML IJ SOLN: 0.2500 mg | INTRAMUSCULAR | Status: DC | PRN

## 2014-08-20 MED ORDER — CEFAZOLIN SODIUM-DEXTROSE 2-3 GM-% IV SOLR: 2.0000 g | INTRAVENOUS | Status: DC

## 2014-08-20 MED ORDER — MIDAZOLAM HCL 2 MG/2ML IJ SOLN: 0.5000 mg | Freq: Once | INTRAMUSCULAR | Status: DC | PRN

## 2014-08-20 MED ORDER — BUPIVACAINE-EPINEPHRINE (PF) 0.25% -1:200000 IJ SOLN
INTRAMUSCULAR | Status: DC | PRN
  Administered 2014-08-20: 6 mL via PERINEURAL

## 2014-08-20 MED ORDER — ALBUTEROL SULFATE (2.5 MG/3ML) 0.083% IN NEBU
2.5000 mg | INHALATION_SOLUTION | Freq: Once | RESPIRATORY_TRACT | Status: AC
  Administered 2014-08-20: 2.5 mg via RESPIRATORY_TRACT

## 2014-08-20 MED ORDER — CEFAZOLIN SODIUM-DEXTROSE 2-3 GM-% IV SOLR
INTRAVENOUS | Status: DC | PRN
  Administered 2014-08-20: 2 g via INTRAVENOUS

## 2014-08-20 MED ORDER — EPHEDRINE SULFATE 50 MG/ML IJ SOLN
INTRAMUSCULAR | Status: DC | PRN
  Administered 2014-08-20: 10 mg via INTRAVENOUS

## 2014-08-20 MED ORDER — SODIUM CHLORIDE 0.9 % IJ SOLN
INTRAMUSCULAR | Status: AC
  Filled 2014-08-20: qty 10

## 2014-08-20 MED ORDER — ALBUTEROL SULFATE (2.5 MG/3ML) 0.083% IN NEBU
INHALATION_SOLUTION | RESPIRATORY_TRACT | Status: AC
  Filled 2014-08-20: qty 3

## 2014-08-20 MED ORDER — PHENYLEPHRINE HCL 10 MG/ML IJ SOLN
INTRAMUSCULAR | Status: DC | PRN
  Administered 2014-08-20: 40 ug via INTRAVENOUS

## 2014-08-20 MED ORDER — LACTATED RINGERS IV SOLN
INTRAVENOUS | Status: DC | PRN
  Administered 2014-08-20 (×2): via INTRAVENOUS

## 2014-08-20 MED ORDER — METHYLENE BLUE 1 % INJ SOLN
INTRAMUSCULAR | Status: AC
  Filled 2014-08-20: qty 10

## 2014-08-20 MED ORDER — FENTANYL CITRATE 0.05 MG/ML IJ SOLN
50.0000 ug | INTRAMUSCULAR | Status: DC | PRN
  Administered 2014-08-20 (×2): 100 ug via INTRAVENOUS

## 2014-08-20 MED ORDER — CHLORHEXIDINE GLUCONATE 4 % EX LIQD: 1.0000 "application " | Freq: Once | CUTANEOUS | Status: DC

## 2014-08-20 MED ORDER — OXYCODONE-ACETAMINOPHEN 5-325 MG PO TABS: 1.0000 | ORAL_TABLET | ORAL | Status: DC | PRN

## 2014-08-20 MED ORDER — TECHNETIUM TC 99M SULFUR COLLOID FILTERED
1.0000 | Freq: Once | INTRAVENOUS | Status: AC | PRN
  Administered 2014-08-20: 1 via INTRADERMAL

## 2014-08-20 MED ORDER — DEXAMETHASONE SODIUM PHOSPHATE 4 MG/ML IJ SOLN
INTRAMUSCULAR | Status: DC | PRN
  Administered 2014-08-20: 10 mg via INTRAVENOUS

## 2014-08-20 MED ORDER — MEPERIDINE HCL 25 MG/ML IJ SOLN: 6.2500 mg | INTRAMUSCULAR | Status: DC | PRN

## 2014-08-20 MED ORDER — FENTANYL CITRATE 0.05 MG/ML IJ SOLN
INTRAMUSCULAR | Status: AC
  Filled 2014-08-20: qty 6

## 2014-08-20 SURGICAL SUPPLY — 42 items
APPLIER CLIP 9.375 MED OPEN (MISCELLANEOUS) ×2
BINDER BREAST LRG (GAUZE/BANDAGES/DRESSINGS) IMPLANT
BINDER BREAST MEDIUM (GAUZE/BANDAGES/DRESSINGS) IMPLANT
BINDER BREAST XLRG (GAUZE/BANDAGES/DRESSINGS) IMPLANT
BINDER BREAST XXLRG (GAUZE/BANDAGES/DRESSINGS) ×2 IMPLANT
BLADE SURG 15 STRL LF DISP TIS (BLADE) ×1 IMPLANT
BLADE SURG 15 STRL SS (BLADE) ×2
CANISTER SUCT 1200ML W/VALVE (MISCELLANEOUS) ×2 IMPLANT
CHLORAPREP W/TINT 26ML (MISCELLANEOUS) ×2 IMPLANT
CLIP APPLIE 9.375 MED OPEN (MISCELLANEOUS) ×1 IMPLANT
COVER BACK TABLE 60X90IN (DRAPES) ×2 IMPLANT
COVER MAYO STAND STRL (DRAPES) ×2 IMPLANT
COVER PROBE W GEL 5X96 (DRAPES) ×2 IMPLANT
DECANTER SPIKE VIAL GLASS SM (MISCELLANEOUS) IMPLANT
DRAPE LAPAROSCOPIC ABDOMINAL (DRAPES) ×2 IMPLANT
DRAPE UTILITY XL STRL (DRAPES) ×2 IMPLANT
ELECT COATED BLADE 2.86 ST (ELECTRODE) ×2 IMPLANT
ELECT REM PT RETURN 9FT ADLT (ELECTROSURGICAL) ×2
ELECTRODE REM PT RTRN 9FT ADLT (ELECTROSURGICAL) ×1 IMPLANT
GLOVE BIOGEL PI IND STRL 8 (GLOVE) ×1 IMPLANT
GLOVE BIOGEL PI INDICATOR 8 (GLOVE) ×1
GLOVE ECLIPSE 8.0 STRL XLNG CF (GLOVE) ×2 IMPLANT
GOWN STRL REUS W/ TWL LRG LVL3 (GOWN DISPOSABLE) ×2 IMPLANT
GOWN STRL REUS W/TWL LRG LVL3 (GOWN DISPOSABLE) ×4
HEMOSTAT SNOW SURGICEL 2X4 (HEMOSTASIS) ×2 IMPLANT
KIT MARKER MARGIN INK (KITS) ×2 IMPLANT
LIQUID BAND (GAUZE/BANDAGES/DRESSINGS) ×2 IMPLANT
NDL SAFETY ECLIPSE 18X1.5 (NEEDLE) ×1 IMPLANT
NEEDLE HYPO 18GX1.5 SHARP (NEEDLE) ×2
NEEDLE HYPO 25X1 1.5 SAFETY (NEEDLE) ×4 IMPLANT
NS IRRIG 1000ML POUR BTL (IV SOLUTION) ×2 IMPLANT
PACK BASIN DAY SURGERY FS (CUSTOM PROCEDURE TRAY) ×2 IMPLANT
PENCIL BUTTON HOLSTER BLD 10FT (ELECTRODE) ×2 IMPLANT
SLEEVE SCD COMPRESS KNEE MED (MISCELLANEOUS) ×2 IMPLANT
SPONGE LAP 4X18 X RAY DECT (DISPOSABLE) ×2 IMPLANT
SUT MNCRL AB 4-0 PS2 18 (SUTURE) ×2 IMPLANT
SUT VICRYL 3-0 CR8 SH (SUTURE) ×2 IMPLANT
SYR CONTROL 10ML LL (SYRINGE) ×4 IMPLANT
TOWEL OR 17X24 6PK STRL BLUE (TOWEL DISPOSABLE) ×2 IMPLANT
TOWEL OR NON WOVEN STRL DISP B (DISPOSABLE) ×2 IMPLANT
TUBE CONNECTING 20X1/4 (TUBING) ×2 IMPLANT
YANKAUER SUCT BULB TIP NO VENT (SUCTIONS) ×2 IMPLANT

## 2014-08-20 NOTE — Progress Notes (Signed)
Assisted Dr. Glennon Mac with left, ultrasound guided, pectoralis block. Side rails up, monitors on throughout procedure. See vital signs in flow sheet. Tolerated Procedure well.

## 2014-08-20 NOTE — Anesthesia Preprocedure Evaluation (Addendum)
Anesthesia Evaluation  Patient identified by MRN, date of birth, ID band Patient awake    Reviewed: Allergy & Precautions, NPO status , Patient's Chart, lab work & pertinent test results, reviewed documented beta blocker date and time   History of Anesthesia Complications Negative for: history of anesthetic complications  Airway Mallampati: II       Dental  (+) Edentulous Upper, Poor Dentition, Missing, Dental Advisory Given   Pulmonary asthma , COPD COPD inhaler, Current Smoker,  breath sounds clear to auscultation        Cardiovascular hypertension, Pt. on medications Rhythm:Regular Rate:Normal     Neuro/Psych Anxiety Depression    GI/Hepatic Neg liver ROS, GERD-  Medicated and Controlled,  Endo/Other  Morbid obesity  Renal/GU negative Renal ROS     Musculoskeletal   Abdominal (+) + obese,   Peds  Hematology   Anesthesia Other Findings Breast cancer  Reproductive/Obstetrics                            Anesthesia Physical Anesthesia Plan  ASA: III  Anesthesia Plan: General   Post-op Pain Management:    Induction: Intravenous  Airway Management Planned: LMA  Additional Equipment:   Intra-op Plan:   Post-operative Plan:   Informed Consent: I have reviewed the patients History and Physical, chart, labs and discussed the procedure including the risks, benefits and alternatives for the proposed anesthesia with the patient or authorized representative who has indicated his/her understanding and acceptance.   Dental advisory given  Plan Discussed with: CRNA and Surgeon  Anesthesia Plan Comments: (Plan routine monitors, GA- LMA OK, pec block for post op analgesia)        Anesthesia Quick Evaluation

## 2014-08-20 NOTE — H&P (Signed)
H&P   Regina Mcmahon (MR# [id number])      H&P Info    Author Note Status Last Update User Last Update Date/Time   Erroll Luna, MD Signed Erroll Luna, MD 07/07/2014 3:55 PM    H&P    Expand All Collapse All   [name] 07/07/2014 2:41 PM Location: Regina Mcmahon Patient #: [id number] DOB: 26-Apr-1951 Single / Language: Regina Mcmahon / Race: White Female History of Present Illness Regina Mcmahon A. [name] MD; 07/07/2014 3:48 PM) Patient words: lt. breast check  Patient sent at the request of Dr. [name] of the [facility] for left breast cancer. Patient noticed mass in left breast upper outer quadrant 1 month ago. 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 Patient developed fullness in the left breast that she detected. Mammography in an option performed with biopsy which shows a low-grade invasive mammary carcinoma favoring lobular. Receptors pending. Patient has some soreness of the biopsy site in her left breast. No history of nipple drainage. She states her nipple was inverted but this has improved since the biopsy.       CLINICAL DATA: Mass with tenderness felt by the patient in the upper outer left breast.  EXAM: DIGITAL DIAGNOSTIC BILATERAL MAMMOGRAM WITH CAD  ULTRASOUND LEFT BREAST  COMPARISON: Previous examinations, the most recent dated 11/14/2012, 11/07/2012 and 10/23/2012.  ACR Breast Density Category b: There are scattered areas of fibroglandular density.  FINDINGS: Interval approximately 6 x 3 x 2.5 cm area of ill-defined increased density in the upper outer left breast, corresponding to the mass felt by the patient, marked with a metallic marker. This is not at the location of the previously biopsied benign calcifications. No findings elsewhere in either breast suspicious for malignancy.  Mammographic images were processed with CAD.  On physical exam, the  patient has an approximately 2.5 x 1.5 cm oval area of irregular, palpable soft tissue thickening in the 2 o'clock position of the left breast, centered 7 cm from the nipple. There are no palpable left axillary lymph nodes.  Ultrasound is performed, showing an ill-defined predominantly hypoechoic area with some increased echogenicity in the 2 o'clock position of the left breast, 7 cm from the nipple. This has irregular margins and measures approximately 2.6 x 2.3 x 1.8 cm in maximum dimensions. At the anterior, superolateral aspect of this abnormality, this is slightly more focally hypoechoic and irregular, measuring 9 x 7 x 6 mm.  Ultrasound of the left axilla demonstrated a normal appearing left axillary lymph node with no abnormal appearing nodes seen.  IMPRESSION: Irregular, heterogeneous mass-like area in the 2 o'clock position of the left breast, corresponding to the palpable mass, as described above. This has imaging features suspicious for malignancy.  RECOMMENDATION: Ultrasound-guided core needle biopsy of the irregular mass-like area in the 2 o'clock position of the left breast. This has been discussed with the patient and scheduled for 1 p.m. on 07/01/2014.  I have discussed the findings and recommendations with the patient. Results were also provided in writing at the conclusion of the visit. If applicable, a reminder letter will be sent to the patient regarding the next appointment.  BI-RADS CATEGORY 5: Highly suggestive of malignancy.   Electronically Signed By: Enrique Sack M.D.          Diagnosis Breast, left, needle core biopsy, 2 o'clock INVASIVE MAMMARY CARCINOMA, PLEASE SEE COMMENT. Microscopic Comment Although the type and grade is best determined at the  time of excision, there is an invasive mammary carcinoma with lobular features that appears to be low grade. An E-cadherin will be performed to further classify this malignancy and an addendum  will follow. There is no evidence of angiolymphatic invasion identified in this material. The longest length involved by tumor measures 1.0 cm from the glass slide. A breast prognostic profile will be performed and an addendum will follow. [name] MD.  The patient is a 64 year old female   Other Problems Regina Mcmahon, [place]; 07/07/2014 2:41 PM) Anxiety Disorder Asthma Breast Cancer Cholelithiasis Chronic Obstructive Lung Disease Depression High blood pressure Lump In Breast  Past Surgical History Regina Mcmahon, [place]; 07/07/2014 2:41 PM) Breast Biopsy Left. Cesarean Section - 1 Colon Polyp Removal - Colonoscopy Gallbladder Mcmahon - Laparoscopic  Diagnostic Studies History Regina Mcmahon, [place]; 07/07/2014 2:41 PM) Colonoscopy 5-10 years ago Mammogram within last year Pap Smear 1-5 years ago  Allergies Regina Mcmahon, [place]; 07/07/2014 2:42 PM) No Known Drug Allergies 07/07/2014  Medication History Regina Mcmahon, CMA; 07/07/2014 2:42 PM) Lisinopril-Hydrochlorothiazide (10-12.5MG  Tablet, Oral) Active.  Social History Regina Mcmahon, [place]; 07/07/2014 2:41 PM) Caffeine use Carbonated beverages. No alcohol use No drug use Tobacco use Current every day smoker.  Family History Regina Mcmahon, [place]; 07/07/2014 2:41 PM) Colon Cancer Brother. Diabetes Mellitus Mother. Heart disease in female family member before age 30 Heart disease in female family member before age 72 Ovarian Cancer Mother.  Pregnancy / Birth History Regina Mcmahon, CMA; 07/07/2014 2:41 PM) Age at menarche 89 years. Age of menopause <47 Contraceptive History Oral contraceptives. Gravida 2 Irregular periods Maternal age 65-20 Para 2     Review of Systems Regina Mcmahon CMA; 07/07/2014 2:41 PM) General Present- Fatigue. Not Present- Appetite Loss, Chills, Fever, Night Sweats, Weight Gain and Weight Loss. HEENT Present- Seasonal Allergies. Not  Present- Earache, Hearing Loss, Hoarseness, Nose Bleed, Oral Ulcers, Ringing in the Ears, Sinus Pain, Sore Throat, Visual Disturbances, Wears glasses/contact lenses and Yellow Eyes. Respiratory Present- Chronic Cough, Difficulty Breathing, Snoring and Wheezing. Not Present- Bloody sputum. Breast Present- Breast Mass and Breast Pain. Not Present- Nipple Discharge and Skin Changes. Cardiovascular Present- Chest Pain, Difficulty Breathing Lying Down, Rapid Heart Rate, Shortness of Breath and Swelling of Extremities. Not Present- Leg Cramps and Palpitations. Gastrointestinal Present- Chronic diarrhea, Hemorrhoids, Indigestion and Rectal Pain. Not Present- Abdominal Pain, Bloating, Bloody Stool, Change in Bowel Habits, Constipation, Difficulty Swallowing, Excessive gas, Gets full quickly at meals, Nausea and Vomiting. Female Genitourinary Present- Frequency, Nocturia and Urgency. Not Present- Painful Urination and Pelvic Pain. Musculoskeletal Present- Back Pain. Not Present- Joint Pain, Joint Stiffness, Muscle Pain, Muscle Weakness and Swelling of Extremities. Neurological Present- Weakness. Not Present- Decreased Memory, Fainting, Headaches, Numbness, Seizures, Tingling, Tremor and Trouble walking. Psychiatric Present- Anxiety, Depression, Fearful and Frequent crying. Not Present- Bipolar and Change in Sleep Pattern. Hematology Present- Easy Bruising. Not Present- Excessive bleeding, Gland problems, HIV and Persistent Infections.  Vitals Regina Mcmahon CMA; 07/07/2014 2:43 PM) 07/07/2014 2:42 PM Weight: 224 lb Height: 65in Body Surface Area: 2.16 m Body Mass Index: 37.28 kg/m Temp.: 97.54F  Pulse: 94 (Regular)  BP: 158/90 (Sitting, Left Arm, Standard)     Physical Exam (Kasie Leccese A. Honore Wipperfurth MD; 07/07/2014 3:50 PM)  General Mental Status-Alert. General Appearance-Consistent with stated age. Hydration-Well hydrated. Voice-Normal.  Head and Neck Head-normocephalic,  atraumatic with no lesions or palpable masses. Trachea-midline. Thyroid Gland Characteristics - normal size and consistency.  Chest and Lung Exam Auscultation Breath sounds - Hissing sound -  Both Lung Fields.  Breast Note: 2 cm mobile left breast UOQ bruising and swelling from biopsy site. no nipple discharge. right breast normal without mass   Cardiovascular Cardiovascular examination reveals -normal heart sounds, regular rate and rhythm with no murmurs and normal pedal pulses bilaterally.  Neurologic Neurologic evaluation reveals -alert and oriented x 3 with no impairment of recent or remote memory. Mental Status-Normal.  Musculoskeletal Normal Exam - Left-Upper Extremity Strength Normal and Lower Extremity Strength Normal. Normal Exam - Right-Upper Extremity Strength Normal and Lower Extremity Strength Normal.  Lymphatic Head & Neck  General Head & Neck Lymphatics: Bilateral - Description - Normal. Axillary  General Axillary Region: Bilateral - Description - Normal. Tenderness - Non Tender.    Assessment & Plan (Jie Stickels A. Mitcheal Sweetin MD; 07/07/2014 3:52 PM)  BREAST CANCER, LEFT (174.9  C50.912) Impression: discussed mastectomy with reconstruction vs left breast seed localized lumpectomy with SLN mapping. pro and cons of each discussed. Risk and benefits as well as other possible treatments discussed. She would like to proceed with breast conservation. Risk of lumpectomy include bleeding, infection, seroma, more Mcmahon, use of seed/wire, wound care, cosmetic deformity and the need for other treatments, death , blood clots, death. Pt agrees to proceed. Risk of sentinel lymph node mapping include bleeding, infection, lymphedema, shoulder pain. stiffness, dye allergy. cosmetic deformity , blood clots, death, need for more Mcmahon. Pt agres to proceed.  Current Plans Referred to Genetic Counseling, for evaluation and follow up (Medical Genetics). Referred to  Oncology, for evaluation and follow up (Oncology). Referred to Radiation Oncology, for evaluation and follow up (Radiation Oncology). Referred to Physical Therapy, for evaluation and follow up (Physical Therapy). Pt Education - Breast Cancer in Women *: breast cancer Pt Education - Breast Cancer in Women *: tumors Pt Education - Breast Cancer: Follow-up After Mcmahon: breast CCS Consent - BASIC (Gross): discussed with patient and provided information. Pt Education - CSS Breast Biopsy Instructions (FLB): discussed with patient and provided information

## 2014-08-20 NOTE — Op Note (Signed)
eoperative diagnosis: Left breast cancer stage 1   Postoperative diagnosis: Same   Procedure: Left breast seed localized lumpectomy and left axilla sentinel lymph node biopsy   Surgeon: Erroll Luna M.D.  Anesthesia: Gen. With 0.25% Sensorcaine local with epinephrine and pectoral block per anesthesia  EBL: 20 cc  Specimen: Left breast tissue with clip and radioactive seed in the specimen. Verified with neoprobe and radiographic image showing both seed and clip in specimen 1 sentinel lymph node   Indications for procedure: The patient presents for left breast  Lumpectomy and SLN mapping  after core biopsy showed IDC. Discussed the rationale for considering LUMPECTOMY AND SLN MAPPING. Discussed wire localization. Patient desired BREAST CONSERVATION. The procedure has been discussed with the patient. Alternatives to surgery have been discussed with the patient.  Risks of surgery include bleeding,  Infection,  Seroma formation, death,  and the need for further surgery. The procedure has been discussed with the patient. Alternatives to surgery have been discussed with the patient.  Risks of surgery include bleeding,  Infection,  Seroma formation, death,  and the need for further surgery.   The patient understands and wishes to proceed.Sentinel lymph node mapping and dissection has been discussed with the patient.  Risk of bleeding,  Infection,  Seroma formation,  Additional procedures,,  Shoulder weakness ,  Shoulder stiffness,  Nerve and blood vessel injury and reaction to the mapping dyes have been discussed.  Alternatives to surgery have been discussed with the patient.  The patient agrees to proceed.  The patient understands and wishes to proceed.   Description of procedure: Patient underwent seed placement as an outpatient. Patient presents today for left breast seed localized lumpectomy and SLN mapping. Patient seen in the  holding area. Nuclear medicine injected patient in the holding area.   Questions are answered and neoprobe used to verify seed location. Patient taken back to the operating room and placed upon the OR table. After induction of general anesthesia, left breast prepped and draped in a sterile fashion. Timeout was done to verify proper side and procedure procedure. Neoprobe used and hot spot identified and left breast upper-outer quadrant. This was marked with pen. Curvilinear incision made left upper outer quadrant breast. Dissection used with the help of a neoprobe around the tissue where the seed and clip were located. Tissue removed in its entirety with gross  Negative margins.. Neoprobe used and seed within specimen. Radiographs taken which show clip and seed  In specimen.Hemostasis achieved and cavity closed with 3-0 Vicryl and 4-0 Monocryl. Dermabond applied.   Left sentinel lymph node done next.  Neoprobe settings changed and hot spot identified left axilla.  Incision made  And dissection carried down to level 1 area. One hot node identified. Background counts approached zero. Hemostasis achieved and wound closed with 3 O vicryl and 4 O monocryl..   Dermabond applied.        All final counts found to be correct. Specimen transported to pathology. Patient awoke extubated taken to recovery in satisfactory condition.

## 2014-08-20 NOTE — Anesthesia Postprocedure Evaluation (Signed)
  Anesthesia Post-op Note  Patient: Regina Mcmahon  Procedure(s) Performed: Procedure(s): LEFT BREAST LUMPECTOMY WITH RADIOACTIVE SEED LOCALIZATION AND SENTINEL LYMPH NODE MAPPING (Left)  Patient Location: PACU  Anesthesia Type:GA combined with regional for post-op pain  Level of Consciousness: awake, alert , oriented, patient cooperative and responds to stimulation  Airway and Oxygen Therapy: Patient Spontanous Breathing  Post-op Pain: none  Post-op Assessment: Post-op Vital signs reviewed, Patient's Cardiovascular Status Stable, Respiratory Function Stable, Patent Airway, No signs of Nausea or vomiting, Adequate PO intake and Pain level controlled  Post-op Vital Signs: Reviewed and stable  Last Vitals:  Filed Vitals:   08/20/14 1249  BP: 114/69  Pulse: 76  Temp: 36.5 C  Resp: 18    Complications: No apparent anesthesia complications

## 2014-08-20 NOTE — Discharge Instructions (Signed)
[facility] °Office Phone Number [phone] ° °BREAST BIOPSY/ PARTIAL MASTECTOMY: POST OP INSTRUCTIONS ° °Always review your discharge instruction sheet given to you by the facility where your surgery was performed. ° °IF YOU HAVE DISABILITY OR FAMILY LEAVE FORMS, YOU MUST BRING THEM TO THE OFFICE FOR PROCESSING.  DO NOT GIVE THEM TO YOUR DOCTOR. ° °1. A prescription for pain medication may be given to you upon discharge.  Take your pain medication as prescribed, if needed.  If narcotic pain medicine is not needed, then you may take acetaminophen (Tylenol) or ibuprofen (Advil) as needed. °2. Take your usually prescribed medications unless otherwise directed °3. If you need a refill on your pain medication, please contact your pharmacy.  They will contact our office to request authorization.  Prescriptions will not be filled after 5pm or on week-ends. °4. You should eat very light the first 24 hours after surgery, such as soup, crackers, pudding, etc.  Resume your normal diet the day after surgery. °5. Most patients will experience some swelling and bruising in the breast.  Ice packs and a good support bra will help.  Swelling and bruising can take several days to resolve.  °6. It is common to experience some constipation if taking pain medication after surgery.  Increasing fluid intake and taking a stool softener will usually help or prevent this problem from occurring.  A mild laxative (Milk of Magnesia or Miralax) should be taken according to package directions if there are no bowel movements after 48 hours. °7. Unless discharge instructions indicate otherwise, you may remove your bandages 24-48 hours after surgery, and you may shower at that time.  You may have steri-strips (small skin tapes) in place directly over the incision.  These strips should be left on the skin for 7-10 days.  If your surgeon used skin glue on the incision, you may shower in 24 hours.  The glue will flake off over the  next 2-3 weeks.  Any sutures or staples will be removed at the office during your follow-up visit. °8. ACTIVITIES:  You may resume regular daily activities (gradually increasing) beginning the next day.  Wearing a good support bra or sports bra minimizes pain and swelling.  You may have sexual intercourse when it is comfortable. °a. You may drive when you no longer are taking prescription pain medication, you can comfortably wear a seatbelt, and you can safely maneuver your car and apply brakes. °b. RETURN TO WORK:  ______________________________________________________________________________________ °9. You should see your doctor in the office for a follow-up appointment approximately two weeks after your surgery.  Your doctor’s nurse will typically make your follow-up appointment when she calls you with your pathology report.  Expect your pathology report 2-3 business days after your surgery.  You may call to check if you do not hear from us after three days. °10. OTHER INSTRUCTIONS: _______________________________________________________________________________________________ _____________________________________________________________________________________________________________________________________ °_____________________________________________________________________________________________________________________________________ °_____________________________________________________________________________________________________________________________________ ° °WHEN TO CALL YOUR DOCTOR: °1. Fever over 101.0 °2. Nausea and/or vomiting. °3. Extreme swelling or bruising. °4. Continued bleeding from incision. °5. Increased pain, redness, or drainage from the incision. ° °The clinic staff is available to answer your questions during regular business hours.  Please don’t hesitate to call and ask to speak to one of the nurses for clinical concerns.  If you have a medical emergency, go to the nearest  emergency room or call 911.  A surgeon from [facility] is always on call at the hospital. ° °For further questions, please visit [email or web address]  ° ° °  Post Anesthesia Home Care Instructions ° °Activity: °Get plenty of rest for the remainder of the day. A responsible adult should stay with you for 24 hours following the procedure.  °For the next 24 hours, DO NOT: °-Drive a car °-Operate machinery °-Drink alcoholic beverages °-Take any medication unless instructed by your physician °-Make any legal decisions or sign important papers. ° °Meals: °Start with liquid foods such as gelatin or soup. Progress to regular foods as tolerated. Avoid greasy, spicy, heavy foods. If nausea and/or vomiting occur, drink only clear liquids until the nausea and/or vomiting subsides. Call your physician if vomiting continues. ° °Special Instructions/Symptoms: °Your throat may feel dry or sore from the anesthesia or the breathing tube placed in your throat during surgery. If this causes discomfort, gargle with warm salt water. The discomfort should disappear within 24 hours. ° °

## 2014-08-20 NOTE — Anesthesia Procedure Notes (Addendum)
Anesthesia Regional Block:  Pectoralis block  Pre-Anesthetic Checklist: ,, timeout performed, Correct Patient, Correct Site, Correct Laterality, Correct Procedure, Correct Position, site marked, Risks and benefits discussed,  Surgical consent,  Pre-op evaluation,  At surgeon's request and post-op pain management  Laterality: Left  Prep: chloraprep       Needles:  Injection technique: Single-shot  Needle Type: Echogenic Needle     Needle Length: 9cm 9 cm Needle Gauge: 22 and 22 G    Additional Needles:  Procedures: ultrasound guided (picture in chart) Pectoralis block Narrative:  Start time: 08/20/2014 9:57 AM End time: 08/20/2014 10:11 AM Injection made incrementally with aspirations every 5 mL.  Performed by: Personally  Anesthesiologist: Seleta Rhymes CARSWELL  Additional Notes: Pt identified in Holding room.  Monitors applied. Working IV access confirmed. Sterile prep.  #22ga Echogenic needle subserratus and rib 4 with US guidance, image in chart., 25cc 0.5% Bupivacaine with 1:200k epi injected incrementally after negative test dose, good local anesth spread.  Patient asymptomatic, VSS, no heme aspirated, tolerated well. Jenita Seashore, MD   Procedure Name: LMA Insertion Date/Time: 08/20/2014 10:46 AM Performed by: Marrianne Mood Pre-anesthesia Checklist: Patient identified, Emergency Drugs available, Suction available, Patient being monitored and Timeout performed Patient Re-evaluated:Patient Re-evaluated prior to inductionOxygen Delivery Method: Circle System Utilized Preoxygenation: Pre-oxygenation with 100% oxygen Intubation Type: IV induction Ventilation: Mask ventilation without difficulty LMA: LMA inserted LMA Size: 4.0 Number of attempts: 1 Airway Equipment and Method: Bite block Placement Confirmation: positive ETCO2 Tube secured with: Tape Dental Injury: Teeth and Oropharynx as per pre-operative assessment

## 2014-08-20 NOTE — Transfer of Care (Signed)
Immediate Anesthesia Transfer of Care Note  Patient: Regina Mcmahon  Procedure(s) Performed: Procedure(s): LEFT BREAST LUMPECTOMY WITH RADIOACTIVE SEED LOCALIZATION AND SENTINEL LYMPH NODE MAPPING (Left)  Patient Location: PACU  Anesthesia Type:GA combined with regional for post-op pain  Level of Consciousness: sedated  Airway & Oxygen Therapy: Patient Spontanous Breathing and Patient connected to face mask oxygen  Post-op Assessment: Report given to PACU RN and Post -op Vital signs reviewed and stable  Post vital signs: Reviewed and stable  Complications: No apparent anesthesia complications

## 2014-08-20 NOTE — Interval H&P Note (Signed)
History and Physical Interval Note:  08/20/2014 9:26 AM  Regina Mcmahon  has presented today for surgery, with the diagnosis of Left Breast Cancer  The various methods of treatment have been discussed with the patient and family. After consideration of risks, benefits and other options for treatment, the patient has consented to  Procedure(s): LEFT BREAST LUMPECTOMY WITH RADIOACTIVE SEED LOCALIZATION AND SENTINEL LYMPH NODE MAPPING (Left) as a surgical intervention .  The patient's history has been reviewed, patient examined, no change in status, stable for surgery.  I have reviewed the patient's chart and labs.  Questions were answered to the patient's satisfaction.     Regina Mcmahon A.

## 2014-08-21 ENCOUNTER — Encounter (HOSPITAL_BASED_OUTPATIENT_CLINIC_OR_DEPARTMENT_OTHER): Payer: Self-pay | Admitting: Surgery

## 2014-08-25 ENCOUNTER — Telehealth: Payer: Self-pay | Admitting: *Deleted

## 2014-08-25 NOTE — Telephone Encounter (Signed)
Called pt & offered [facility] since she lives in [place] & she accepted.  Emailed Engineer, civil (consulting) at [employer] to sent referral to [facility] for them to schedule pt.

## 2014-08-26 ENCOUNTER — Ambulatory Visit (INDEPENDENT_AMBULATORY_CARE_PROVIDER_SITE_OTHER): Payer: Self-pay | Admitting: Surgery

## 2014-08-29 NOTE — Progress Notes (Addendum)
Location of Breast Cancer:Left breast upper-outer quadrant  Histology per Pathology Report:08/20/2014 Diagnosis 1. Breast, lumpectomy, left - INVASIVE DUCTAL CARCINOMA SEE COMMENT. - POSITIVE FOR LYMPHOVASCULAR INVASION. - POSITIVE FOR LARGE VESSEL INVASION. - INVASIVE TUMOR IS LESS THAN 1 MM FROM NEAREST ANTERIOR MARGIN. - INVASIVE TUMOR IS FOCALLY PRESENT AT MEDIAL MARGIN. - DUCTAL CARCINOMA IN SITU. - SEE TUMOR SYNOPTIC TEMPLATE BELOW. 2. Lymph node, sentinel, biopsy, left - ONE LYMPH NODE, POSITIVE FOR METASTATIC MAMMARY CARCINOMA (1/1). - INTRANODAL TUMOR DEPOSIT IS 0.7 CM. - NEGATIVE FOR EXTRACAPSULAR TUMOR EXTENSION.  Receptor Status: ER(+), PR (+), Her2-neu (-)  Did patient present with symptoms (if so, please note symptoms) or was this found on screening mammography?:mass noticed by patient in left quadrant.  Past/Anticipated interventions by surgeon, if KFE:XMDY breast lumpectomy and SLN mapping on 08/20/2014 by Dr.Thomas Cornett. For follow up on 09/10/14 and re-excision scheduled on 09/17/14.  Past/Anticipated interventions by medical oncology, if any: Chemotherapy  Lymphedema issues, if any:No  Pain issues, if JWL:KHVFMBBU of left breast only.  SAFETY ISSUES:  Prior radiation? No  Pacemaker/ICD? No   Possible current pregnancy?No. Tubal ligation.  Is the patient on methotrexate?No  Current Complaints / other details: Menarche age 52.Gravida 2 Para 2. First child age 58 vaginal birth and 2nd child at age 38 (c-section).last menstrual period 16 years ago about age 28. Mother deceased had ovarian cancer.Brother deceased age 24 colon cancer. Father deceased motor vehicle accident over 40 years ago. History of panic attacks NKDA Current day smoker 1/2 ppd. No alcohol or drugs    Arlyss Repress, RN 08/29/2014,3:10 PM

## 2014-09-03 ENCOUNTER — Ambulatory Visit
Admission: RE | Admit: 2014-09-03 | Discharge: 2014-09-03 | Disposition: A | Discharge status: Home / Self Care | Payer: 59 | Source: Ambulatory Visit | Attending: Radiation Oncology | Admitting: Radiation Oncology

## 2014-09-03 ENCOUNTER — Encounter: Payer: Self-pay | Admitting: *Deleted

## 2014-09-03 ENCOUNTER — Encounter: Payer: Self-pay | Admitting: Radiation Oncology

## 2014-09-03 VITALS — BP 128/66 | HR 74 | Temp 98.4°F | Wt 221.8 lb

## 2014-09-03 DIAGNOSIS — Z800 Family history of malignant neoplasm of digestive organs: Secondary | ICD-10-CM | POA: Insufficient documentation

## 2014-09-03 DIAGNOSIS — F17200 Nicotine dependence, unspecified, uncomplicated: Secondary | ICD-10-CM | POA: Diagnosis not present

## 2014-09-03 DIAGNOSIS — Z9012 Acquired absence of left breast and nipple: Secondary | ICD-10-CM | POA: Diagnosis not present

## 2014-09-03 DIAGNOSIS — Z8041 Family history of malignant neoplasm of ovary: Secondary | ICD-10-CM | POA: Insufficient documentation

## 2014-09-03 DIAGNOSIS — C50412 Malignant neoplasm of upper-outer quadrant of left female breast: Secondary | ICD-10-CM | POA: Diagnosis present

## 2014-09-03 DIAGNOSIS — C773 Secondary and unspecified malignant neoplasm of axilla and upper limb lymph nodes: Secondary | ICD-10-CM | POA: Insufficient documentation

## 2014-09-03 DIAGNOSIS — Z170 Estrogen receptor positive status [ER+]: Secondary | ICD-10-CM | POA: Diagnosis not present

## 2014-09-03 NOTE — Progress Notes (Signed)
Met with pt prior to Dr. Pablo Ledger new pt appt. Gave pt navigation resources, journey book and contact information. Pt denies needs at this time. Encourage pt to call with questions or concern. Received verbal understanding.

## 2014-09-03 NOTE — Progress Notes (Addendum)
Radiation Oncology         [phone]) [phone] ________________________________  Initial outpatient Consultation - Date: 09/03/2014   Name: Regina Mcmahon MRN: [id number]   DOB: [birth year]  REFERRING PHYSICIAN: Erroll Luna, MD  DIAGNOSIS:    ICD-9-CM ICD-10-CM   1. Carcinoma of upper-outer quadrant of left female breast 174.4 C50.412    HISTORY OF PRESENT ILLNESS::Regina Mcmahon is a 64 y.o. female  Who palpated a left breast mass last year. She had a biopsy of the left breast 2 years ago due to calcifications which was a fibroadenoma. Her biopsy showed invasive ductal carcinoma ER/PR+.  She had a lumpectomy on 1/27 which showed a a 2.6 cm invasive ductal carcinoma with lymphvascular invasion and large vessel invasion with a positive medial margin and a close anterior margin. One sentinel node was positive for metastatic mammary carcinoma with a 7 mm deposit. No extracapsular extension was present. She is present with her sister, son and daughter-in-law. She has recovered well from surgery. She is concerned about volume loss and her nipple is everted and pulling towards the left. She had menses at age 20. First child at 55 with GXP2. Her brother died of colon cancer, her mother had ovarian cancer and there are many other malignancies in her family. She is meeting with Dr. Whitney Muse tomorrow and has re-excision scheduled for 2/24.   PREVIOUS RADIATION THERAPY: No  Past medical, social and family history were reviewed in the electronic chart. Review of symptoms was reviewed in the electronic chart. Medications were reviewed in the electronic chart.   PHYSICAL EXAM:  Filed Vitals:   09/03/14 1457  BP: 128/66  Pulse: 74  Temp: 98.4 F (36.9 C)  .221 lb 12.8 oz (100.608 kg). Pleasant female. Healing incision in the lateral aspect of the breast. Healing in the incision under her arm. 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   PLAN:I spoke to the  patient today regarding her diagnosis and options for treatment. We discussed the equivalence in terms of survival and local failure between mastectomy and breast conservation. We discussed the role of radiation in decreasing local failures in patients who undergo lumpectomy. We discussed the process of simulation and the placement tattoos. We discussed 6 weeks of treatment as an outpatient. We discussed the possibility of asymptomatic lung damage. We discussed the low likelihood of secondary malignancies. We discussed the possible side effects including but not limited to skin redness, fatigue, permanent skin darkening, and breast swelling. We discussed the process of simulation and the placement of tattoos.   She has re-excicion scheduled for 2/24.  The results of the Z11 study and AMAROS indicate that she does not need a full axillary node dissection and I can treat the axilla with radiation. She does, however, need negative margins.   The issue of chemotherapy is up in the air as well. She has heard about Oncotype and we talked about that.  I was unsure of its value in a node positive patient but encouraged her to discuss this with Dr. Whitney Muse as I know we have occasionally used it in this setting.  I did clarify with her that if she needed chemotherapy this would be performed prior to radiation.   We also discussed referral to genetics due to her significant family history. She has a daughter and a niece who are both concerned.  I encouraged her to discuss this with Dr. Whitney Muse as well.   We also spent  a fair amount of time discussion smoking cessation. She is going to try using e-cigarettes for now.  She has tried to quit in the past and been unsuccessful. I discussed with her the increase in lung cancer we see in patients who receive radiation for breast cancer and continue smoking.    I spent 60 minutes  face to face with the patient and more than 50% of that time was spent in counseling and/or  coordination of care.   ------------------------------------------------  Thea Silversmith, MD

## 2014-09-03 NOTE — Progress Notes (Signed)
Please see the Nurse Progress Note in the MD Initial Consult Encounter for this patient. 

## 2014-09-04 ENCOUNTER — Encounter (HOSPITAL_COMMUNITY): Payer: Self-pay | Admitting: Hematology & Oncology

## 2014-09-04 ENCOUNTER — Encounter (HOSPITAL_COMMUNITY): Payer: 59 | Attending: Hematology & Oncology | Admitting: Hematology & Oncology

## 2014-09-04 VITALS — BP 128/63 | HR 82 | Temp 98.5°F | Resp 16 | Ht 61.0 in | Wt 218.3 lb

## 2014-09-04 DIAGNOSIS — Z800 Family history of malignant neoplasm of digestive organs: Secondary | ICD-10-CM

## 2014-09-04 DIAGNOSIS — Z170 Estrogen receptor positive status [ER+]: Secondary | ICD-10-CM | POA: Diagnosis not present

## 2014-09-04 DIAGNOSIS — Z8041 Family history of malignant neoplasm of ovary: Secondary | ICD-10-CM

## 2014-09-04 DIAGNOSIS — C773 Secondary and unspecified malignant neoplasm of axilla and upper limb lymph nodes: Secondary | ICD-10-CM | POA: Diagnosis not present

## 2014-09-04 DIAGNOSIS — C50912 Malignant neoplasm of unspecified site of left female breast: Secondary | ICD-10-CM

## 2014-09-04 DIAGNOSIS — C50412 Malignant neoplasm of upper-outer quadrant of left female breast: Secondary | ICD-10-CM

## 2014-09-04 NOTE — Progress Notes (Signed)
[facility] CONSULT NOTE  Patient Care Team: Sinda Du, MD as PCP - General (Internal Medicine)  CHIEF COMPLAINTS/PURPOSE OF CONSULTATION:  L breast cancer History of L breast core biopsy for microcalcifications in 2014, fibroadenoma.    Carcinoma of upper-outer quadrant of left female breast   06/24/2014 Mammogram Irregular, heterogeneous mass-like area in the 2 o'clock position of the left breast, corresponding to the palpable mass, as described above. This has imaging features suspicious for malignancy.   07/01/2014 Initial Biopsy ER+ 100%, PR+ 91%, Ki-67 32% Invasive mammary carcinoma with lobular features   08/20/2014 Surgery Left breast seed localized lumpectomy and left axilla sentinel lymph node biopsy   09/17/2014 Surgery port-a cath placement    HISTORY OF PRESENTING ILLNESS:  Regina Mcmahon 64 y.o. female is here because of newly diagnosed breast cancer. She reports noticing a fullness to the Left breast and a palpable abnormality. She underwent a mammogram on December 1 that noted an irregularity. Her initial biopsy was on December 8 and was remarkable for an invasive mammary carcinoma which was ER positive and PR positive. She underwent definitive surgery on January 27 that showed a 2.6 cm invasive ductal carcinoma with lymphovascular invasion and large vessel invasion she had a positive medial margin and a close anterior margin. One sentinel node was positive for disease. She has a reexcision scheduled for February 24. She has met with radiation oncology. She is very nervous about her diagnosis.  Today in conversation she emphasizes that she had a mother died from ovarian cancer and she is convinced the chemotherapy killed her. She also saw a brother go through chemotherapy for an advanced malignancy and his side effects of treatment for right in her as well. His a lot of concerns that if she pursues any chemotherapy she will be unable to care for herself. She  emphasizes she does not want to be a burden to her family.    MEDICAL HISTORY:  Past Medical History  Diagnosis Date  . Hypertension   . Panic attacks   . Chronic airway obstruction   . Pain     Hx.pain in joint involving pelvic region and thigh; pain in limb  . Sinusitis   . Asthma   . Palpitations   . Hemorrhage rectum     and anus.  . Symptomatic menopausal or female climacteric states   . Asymptomatic varicose veins   . Other symptoms involving digestive system(787.99)   . Depression   . Breast cancer 06/2014    left  . Insomnia    she had her first menstrual cycle at the age of 10. Her first child was born at the age of 68.  SURGICAL HISTORY: Past Surgical History  Procedure Laterality Date  . Cesarean section    . Cholecystectomy  1985  . Tubal ligation    . Colonoscopy    . Radioactive seed guided mastectomy with axillary sentinel lymph node biopsy Left 08/20/2014    Procedure: LEFT BREAST LUMPECTOMY WITH RADIOACTIVE SEED LOCALIZATION AND SENTINEL LYMPH NODE MAPPING;  Surgeon: Erroll Luna, MD;  Location: [facility];  Service: General;  Laterality: Left;  . Portacath placement Right 09/17/2014    Procedure: INSERTION PORT-A-CATH WITH ULTRASOUND;  Surgeon: Erroll Luna, MD;  Location: [facility];  Service: General;  Laterality: Right;  IJ  . Re-excision of breast lumpectomy Left 09/17/2014    Procedure: RE-EXCISION OF BREAST LUMPECTOMY;  Surgeon: Erroll Luna, MD;  Location: [facility];  Service: General;  Laterality: Left;    SOCIAL HISTORY: History   Social History  . Marital Status: Divorced    Spouse Name: N/A  . Number of Children: 2  . Years of Education: N/A   Occupational History  . Not on file.   Social History Main Topics  . Smoking status: Current Every Day Smoker -- 0.50 packs/day    Types: Cigarettes  . Smokeless tobacco: Never Used  . Alcohol Use: No  . Drug Use: No  . Sexual Activity: No    Other Topics Concern  . Not on file   Social History Narrative   she lives alone. She works and is physically active. She smokes approximately a half pack a day. She denies any alcohol use.  FAMILY HISTORY: Family History  Problem Relation Age of Onset  . Cancer Mother     ovarian; deceased age 73  . Diabetes Mother   . Other Father     died in MVA at age 51  . Other Sister     mitral valve disorder  . Cancer Brother     colon; deceased age 66  . Cancer Paternal Aunt     bone or blood   indicated that her mother is deceased. She indicated that her father is deceased. She indicated that her brother is deceased. She indicated that her paternal aunt is deceased.   Brother died of stage IV colon cancer. Her mother died from ovarian cancer.  ALLERGIES:  has No Known Allergies.  MEDICATIONS:  Current Outpatient Prescriptions  Medication Sig Dispense Refill  . albuterol (PROVENTIL HFA;VENTOLIN HFA) 108 (90 BASE) MCG/ACT inhaler Inhale 2 puffs into the lungs every 6 (six) hours as needed for wheezing or shortness of breath.    . diphenhydramine-acetaminophen (TYLENOL PM) 25-500 MG TABS Take 2 tablets by mouth at bedtime.    Marland Kitchen ibuprofen (ADVIL,MOTRIN) 200 MG tablet Take 200 mg by mouth every 6 (six) hours as needed.    Marland Kitchen tiZANidine (ZANAFLEX) 4 MG tablet     . acetaminophen (TYLENOL) 325 MG tablet Take 650 mg by mouth every 6 (six) hours as needed.    . ALPRAZolam (XANAX) 0.5 MG tablet Take 0.5 mg by mouth 3 (three) times daily as needed for anxiety.    . Cyclophosphamide (CYTOXAN IJ) Inject as directed. Every 21 days starting 10/02/14    . fluorouracil in sodium chloride 0.9 % 50 mL Inject into the vein once. Every 21 days starting 10/02/14    . hydrochlorothiazide (MICROZIDE) 12.5 MG capsule Take 12.5 mg by mouth daily.    Marland Kitchen lidocaine-prilocaine (EMLA) cream Apply a quarter size amount to port site 1 hour prior to chemo. Do not rub in. Cover with plastic wrap. 30 g 3  . METHOTREXATE  SODIUM IJ Inject as directed. Every 21 days starting 10/02/14    . omeprazole (PRILOSEC) 40 MG capsule Take 1 capsule (40 mg total) by mouth daily. 30 capsule 4  . ondansetron (ZOFRAN) 8 MG tablet Take 1 tablet every 8 hours as needed for nausea/vomiting. 30 tablet 2  . oxyCODONE-acetaminophen (ROXICET) 5-325 MG per tablet Take 1 tablet by mouth every 4 (four) hours as needed. 30 tablet 0  . oxyCODONE-acetaminophen (ROXICET) 5-325 MG per tablet Take 1-2 tablets by mouth every 4 (four) hours as needed. 30 tablet 0  . prochlorperazine (COMPAZINE) 10 MG tablet Take 1 tablet (10 mg total) by mouth every 6 (six) hours as needed (Nausea or vomiting). 30 tablet 1  No current facility-administered medications for this visit.    Review of Systems  Constitutional: Negative for fever, chills, weight loss and malaise/fatigue.  HENT: Negative for congestion, hearing loss, nosebleeds, sore throat and tinnitus.   Eyes: Negative for blurred vision, double vision, pain and discharge.  Respiratory: Negative for cough, hemoptysis, sputum production, shortness of breath and wheezing.   Cardiovascular: Negative for chest pain, palpitations, claudication, leg swelling and PND.  Gastrointestinal: Negative for heartburn, nausea, vomiting, abdominal pain, diarrhea, constipation, blood in stool and melena.  Genitourinary: Negative for dysuria, urgency, frequency and hematuria.  Musculoskeletal: Positive for back pain. Negative for myalgias, joint pain and falls.  Skin: Negative for itching and rash.  Neurological: Negative for dizziness, tingling, tremors, sensory change, speech change, focal weakness, seizures, loss of consciousness, weakness and headaches.  Endo/Heme/Allergies: Does not bruise/bleed easily.  Psychiatric/Behavioral: Negative for depression, suicidal ideas, memory loss and substance abuse. The patient is nervous/anxious and has insomnia.     PHYSICAL EXAMINATION: ECOG PERFORMANCE STATUS: 0 -  Asymptomatic  Filed Vitals:   09/04/14 0832  BP: 128/63  Pulse: 82  Temp: 98.5 F (36.9 C)  Resp: 16   Filed Weights   09/04/14 0832  Weight: 218 lb 4.8 oz (99.02 kg)     Physical Exam  Constitutional: She is oriented to person, place, and time and well-developed, well-nourished, and in no distress.  HENT:  Head: Normocephalic and atraumatic.  Nose: Nose normal.  Mouth/Throat: Oropharynx is clear and moist. No oropharyngeal exudate.  Eyes: Conjunctivae and EOM are normal. Pupils are equal, round, and reactive to light. Right eye exhibits no discharge. Left eye exhibits no discharge. No scleral icterus.  Neck: Normal range of motion. Neck supple. No tracheal deviation present. No thyromegaly present.  Cardiovascular: Normal rate, regular rhythm and normal heart sounds.  Exam reveals no gallop and no friction rub.   No murmur heard. Pulmonary/Chest: Effort normal and breath sounds normal. She has no wheezes. She has no rales.  Abdominal: Soft. Bowel sounds are normal. She exhibits no distension and no mass. There is no tenderness. There is no rebound and no guarding.  Musculoskeletal: Normal range of motion. She exhibits no edema.  Lymphadenopathy:    She has no cervical adenopathy.  Neurological: She is alert and oriented to person, place, and time. She has normal reflexes. No cranial nerve deficit. Gait normal. Coordination normal.  Skin: Skin is warm and dry. No rash noted.  Psychiatric: Mood, memory, affect and judgment normal.  Nursing note and vitals reviewed.    LABORATORY DATA:  I have reviewed the data as listed Lab Results  Component Value Date   WBC 9.1 10/02/2014   HGB 17.2* 10/02/2014   HCT 50.5* 10/02/2014   MCV 88.9 10/02/2014   PLT 216 10/02/2014     Chemistry      Component Value Date/Time   NA 139 10/02/2014 0925   K 3.5 10/02/2014 0925   CL 104 10/02/2014 0925   CO2 29 10/02/2014 0925   BUN 14 10/02/2014 0925   CREATININE 0.72 10/02/2014 0925        Component Value Date/Time   CALCIUM 9.4 10/02/2014 0925   ALKPHOS 64 10/02/2014 0925   AST 14 10/02/2014 0925   ALT 14 10/02/2014 0925   BILITOT 0.9 10/02/2014 0925       ASSESSMENT & PLAN:  T2 N1 invasive ductal carcinoma of the left breast, ER positive, PR positive, HER-2 negative X  Tumor deposit within the lymph node is  39.57 cm  64 year old female who presents with a newly diagnosed invasive ductal carcinoma of the breast. She has a lot of anxiety about pursuing additional treatment, specifically chemotherapy because of family experiences. She is convinced that the taxanes her mother received for her ovarian cancer ultimately ended her life. She states the treatments her brother received for stage IV colon cancer made him extremely ill. She does not want to be a burden on her family.  She feels well versed in regards to radiation. I spent time explaining the role of endocrine therapy in treating estrogen dependent breast cancers and also in preventing second estsrogen dependent breast cancers. We briefly addressed the side effects of this therapy. I advised her we would spend much more time in regards to risks and benefits of this treatment at her next visit.  I discussed the role of Oncotype and helping decide benefit from chemotherapy for women with  ER positive and HER-2 negative breast cancer. Since she has nodal involvement that is not micrometastatic by definition I do not feel she is a good candidate for Oncotype. I reviewed the NCCN guidelines in detail and advised her that chemotherapy would be my recommendation. After a long discussion she seemed to be open to this. I presented her with eating information and the national guidelines. I will plan on seeing her back after her reexcision in order to address additional questions and concerns.     Molli Hazard, MD  10/13/2014 7:05 PM

## 2014-09-04 NOTE — Patient Instructions (Signed)
[facility] at [facility]  Discharge Instructions:  Exam and discussion by Dr. Whitney Muse. Review the material you were given today and we will see you back just before you have the re-excision to further discuss treatment options.. If you decide to take the adjuvant therapy, your surgeon will insert a port a cath the same time that he does your re-excision.  _______________________________________________________________  Thank you for choosing [facility] at [facility] to provide your oncology and hematology care.  To afford each patient quality time with our providers, please arrive at least 15 minutes before your scheduled appointment.  You need to re-schedule your appointment if you arrive 10 or more minutes late.  We strive to give you quality time with our providers, and arriving late affects you and other patients whose appointments are after yours.  Also, if you no show three or more times for appointments you may be dismissed from the clinic.  Again, thank you for choosing [facility] hope is that these requests will allow you access to exceptional care and in a timely manner. _______________________________________________________________  If you have questions after your visit, please contact our office at [phone] between the hours of 8:30 a.m. and 5:00 p.m. Voicemails left after 4:30 p.m. will not be returned until the following business day. _______________________________________________________________  For prescription refill requests, have your pharmacy contact our office. _______________________________________________________________  Recommendations made by the consultant and any test results will be sent to your referring physician. _______________________________________________________________

## 2014-09-11 ENCOUNTER — Encounter (HOSPITAL_BASED_OUTPATIENT_CLINIC_OR_DEPARTMENT_OTHER): Payer: Self-pay | Admitting: *Deleted

## 2014-09-11 NOTE — Progress Notes (Signed)
Pt was here 08/20/14 for lumpectomy snbx-one node positive and margins close-now for re-exc-axillary node dis and pac Pt is still not sure of chemo, but told her while they were in there-they will put one in just in case . She was ok with that-takes about her panic attacks and anxiety-crying. Suggested she ask dr Luan Pulling to increase xanax and maybe state another meds to help her. She will need to bring all meds and overnight bag-no additional labs needed

## 2014-09-15 ENCOUNTER — Encounter (HOSPITAL_BASED_OUTPATIENT_CLINIC_OR_DEPARTMENT_OTHER): Payer: 59 | Admitting: Hematology & Oncology

## 2014-09-15 ENCOUNTER — Encounter (HOSPITAL_COMMUNITY): Payer: Self-pay | Admitting: Hematology & Oncology

## 2014-09-15 VITALS — BP 141/81 | HR 72 | Temp 98.5°F | Resp 18 | Wt 224.8 lb

## 2014-09-15 DIAGNOSIS — Z170 Estrogen receptor positive status [ER+]: Secondary | ICD-10-CM | POA: Diagnosis not present

## 2014-09-15 DIAGNOSIS — F418 Other specified anxiety disorders: Secondary | ICD-10-CM

## 2014-09-15 DIAGNOSIS — R4589 Other symptoms and signs involving emotional state: Secondary | ICD-10-CM

## 2014-09-15 DIAGNOSIS — C50912 Malignant neoplasm of unspecified site of left female breast: Secondary | ICD-10-CM

## 2014-09-15 DIAGNOSIS — C50412 Malignant neoplasm of upper-outer quadrant of left female breast: Secondary | ICD-10-CM

## 2014-09-15 NOTE — Patient Instructions (Signed)
[place] at [facility] Discharge Instructions  RECOMMENDATIONS MADE BY THE CONSULTANT AND ANY TEST RESULTS WILL BE SENT TO YOUR REFERRING PHYSICIAN.  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 Best recommendation is for  intravenous chemotherapy for 4 cycles using taxotere and cytoxan, followed by radiation therapy then endocrine therapy. Regardless of what you choose we will support your decision.   For questions or concerns call us Mickie Kay, RN [phone])  Thank you for choosing [place] at [facility] to provide your oncology and hematology care.  To afford each patient quality time with our provider, please arrive at least 15 minutes before your scheduled appointment time.    You need to re-schedule your appointment should you arrive 10 or more minutes late.  We strive to give you quality time with our providers, and arriving late affects you and other patients whose appointments are after yours.  Also, if you no show three or more times for appointments you may be dismissed from the clinic at the providers discretion.     Again, thank you for choosing [facility].  Our hope is that these requests will decrease the amount of time that you wait before being seen by our physicians.       _____________________________________________________________  Should you have questions after your visit to [facility], please contact our office at [phone] between the hours of 8:30 a.m. and 4:30 p.m.  Voicemails left after 4:30 p.m. will not be returned until the following business day.  For prescription refill requests, have your pharmacy contact our office.    Cyclophosphamide injection What is this medicine? CYCLOPHOSPHAMIDE (sye kloe FOSS fa mide) is a chemotherapy drug. It slows the growth of cancer cells. This medicine is used to treat many types of cancer like lymphoma, myeloma,  leukemia, breast cancer, and ovarian cancer, to name a few. This medicine may be used for other purposes; ask your health care provider or pharmacist if you have questions. COMMON BRAND NAME(S): Cytoxan, Neosar What should I tell my health care provider before I take this medicine? They need to know if you have any of these conditions: -blood disorders -history of other chemotherapy -infection -kidney disease -liver disease -recent or ongoing radiation therapy -tumors in the bone marrow -an unusual or allergic reaction to cyclophosphamide, other chemotherapy, other medicines, foods, dyes, or preservatives -pregnant or trying to get pregnant -breast-feeding How should I use this medicine? This drug is usually given as an injection into a vein or muscle or by infusion into a vein. It is administered in a hospital or clinic by a specially trained health care professional. Talk to your pediatrician regarding the use of this medicine in children. Special care may be needed. Overdosage: If you think you have taken too much of this medicine contact a poison control center or emergency room at once. NOTE: This medicine is only for you. Do not share this medicine with others. What if I miss a dose? It is important not to miss your dose. Call your doctor or health care professional if you are unable to keep an appointment. What may interact with this medicine? This medicine may interact with the following medications: -amiodarone -amphotericin B -azathioprine -certain antiviral medicines for HIV or AIDS such as protease inhibitors (e.g., indinavir, ritonavir) and zidovudine -certain blood pressure medications such as benazepril, captopril, enalapril, fosinopril, lisinopril, moexipril, monopril, perindopril, quinapril, ramipril, trandolapril -certain cancer medications such  as anthracyclines (e.g., daunorubicin, doxorubicin), busulfan, cytarabine, paclitaxel, pentostatin, tamoxifen,  trastuzumab -certain diuretics such as chlorothiazide, chlorthalidone, hydrochlorothiazide, indapamide, metolazone -certain medicines that treat or prevent blood clots like warfarin -certain muscle relaxants such as succinylcholine -cyclosporine -etanercept -indomethacin -medicines to increase blood counts like filgrastim, pegfilgrastim, sargramostim -medicines used as general anesthesia -metronidazole -natalizumab This list may not describe all possible interactions. Give your health care provider a list of all the medicines, herbs, non-prescription drugs, or dietary supplements you use. Also tell them if you smoke, drink alcohol, or use illegal drugs. Some items may interact with your medicine. What should I watch for while using this medicine? Visit your doctor for checks on your progress. This drug may make you feel generally unwell. This is not uncommon, as chemotherapy can affect healthy cells as well as cancer cells. Report any side effects. Continue your course of treatment even though you feel ill unless your doctor tells you to stop. Drink water or other fluids as directed. Urinate often, even at night. In some cases, you may be given additional medicines to help with side effects. Follow all directions for their use. Call your doctor or health care professional for advice if you get a fever, chills or sore throat, or other symptoms of a cold or flu. Do not treat yourself. This drug decreases your body's ability to fight infections. Try to avoid being around people who are sick. This medicine may increase your risk to bruise or bleed. Call your doctor or health care professional if you notice any unusual bleeding. Be careful brushing and flossing your teeth or using a toothpick because you may get an infection or bleed more easily. If you have any dental work done, tell your dentist you are receiving this medicine. You may get drowsy or dizzy. Do not drive, use machinery, or do anything  that needs mental alertness until you know how this medicine affects you. Do not become pregnant while taking this medicine or for 1 year after stopping it. Women should inform their doctor if they wish to become pregnant or think they might be pregnant. Men should not father a child while taking this medicine and for 4 months after stopping it. There is a potential for serious side effects to an unborn child. Talk to your health care professional or pharmacist for more information. Do not breast-feed an infant while taking this medicine. This medicine may interfere with the ability to have a child. This medicine has caused ovarian failure in some women. This medicine has caused reduced sperm counts in some men. You should talk with your doctor or health care professional if you are concerned about your fertility. If you are going to have surgery, tell your doctor or health care professional that you have taken this medicine. What side effects may I notice from receiving this medicine? Side effects that you should report to your doctor or health care professional as soon as possible: -allergic reactions like skin rash, itching or hives, swelling of the face, lips, or tongue -low blood counts - this medicine may decrease the number of white blood cells, red blood cells and platelets. You may be at increased risk for infections and bleeding. -signs of infection - fever or chills, cough, sore throat, pain or difficulty passing urine -signs of decreased platelets or bleeding - bruising, pinpoint red spots on the skin, black, tarry stools, blood in the urine -signs of decreased red blood cells - unusually weak or tired, fainting spells, lightheadedness -breathing problems -  dark urine -dizziness -palpitations -swelling of the ankles, feet, hands -trouble passing urine or change in the amount of urine -weight gain -yellowing of the eyes or skin Side effects that usually do not require medical attention  (report to your doctor or health care professional if they continue or are bothersome): -changes in nail or skin color -hair loss -missed menstrual periods -mouth sores -nausea, vomiting This list may not describe all possible side effects. Call your doctor for medical advice about side effects. You may report side effects to FDA at 1-800-FDA-1088. Where should I keep my medicine? This drug is given in a hospital or clinic and will not be stored at home. NOTE: This sheet is a summary. It may not cover all possible information. If you have questions about this medicine, talk to your doctor, pharmacist, or health care provider.  2015, [employer]. (2012-05-25 16:22:58)

## 2014-09-17 ENCOUNTER — Ambulatory Visit (HOSPITAL_COMMUNITY): Payer: 59

## 2014-09-17 ENCOUNTER — Ambulatory Visit (HOSPITAL_BASED_OUTPATIENT_CLINIC_OR_DEPARTMENT_OTHER): Payer: 59 | Admitting: Certified Registered"

## 2014-09-17 ENCOUNTER — Encounter (HOSPITAL_BASED_OUTPATIENT_CLINIC_OR_DEPARTMENT_OTHER)
Admission: RE | Disposition: A | Discharge status: Home / Self Care | Payer: Self-pay | Source: Ambulatory Visit | Attending: Surgery

## 2014-09-17 ENCOUNTER — Encounter (HOSPITAL_BASED_OUTPATIENT_CLINIC_OR_DEPARTMENT_OTHER): Payer: Self-pay | Admitting: Certified Registered"

## 2014-09-17 ENCOUNTER — Ambulatory Visit (HOSPITAL_BASED_OUTPATIENT_CLINIC_OR_DEPARTMENT_OTHER)
Admission: RE | Admit: 2014-09-17 | Discharge: 2014-09-17 | Disposition: A | Discharge status: Home / Self Care | Payer: 59 | Source: Ambulatory Visit | Attending: Surgery | Admitting: Surgery

## 2014-09-17 DIAGNOSIS — C50912 Malignant neoplasm of unspecified site of left female breast: Secondary | ICD-10-CM | POA: Diagnosis not present

## 2014-09-17 DIAGNOSIS — N641 Fat necrosis of breast: Secondary | ICD-10-CM | POA: Diagnosis not present

## 2014-09-17 DIAGNOSIS — I878 Other specified disorders of veins: Secondary | ICD-10-CM

## 2014-09-17 DIAGNOSIS — J449 Chronic obstructive pulmonary disease, unspecified: Secondary | ICD-10-CM | POA: Diagnosis not present

## 2014-09-17 DIAGNOSIS — Z95828 Presence of other vascular implants and grafts: Secondary | ICD-10-CM

## 2014-09-17 DIAGNOSIS — Z9889 Other specified postprocedural states: Secondary | ICD-10-CM | POA: Insufficient documentation

## 2014-09-17 DIAGNOSIS — I10 Essential (primary) hypertension: Secondary | ICD-10-CM | POA: Insufficient documentation

## 2014-09-17 DIAGNOSIS — Z79899 Other long term (current) drug therapy: Secondary | ICD-10-CM | POA: Insufficient documentation

## 2014-09-17 DIAGNOSIS — N6092 Unspecified benign mammary dysplasia of left breast: Secondary | ICD-10-CM | POA: Insufficient documentation

## 2014-09-17 DIAGNOSIS — F17200 Nicotine dependence, unspecified, uncomplicated: Secondary | ICD-10-CM | POA: Diagnosis not present

## 2014-09-17 DIAGNOSIS — J45909 Unspecified asthma, uncomplicated: Secondary | ICD-10-CM | POA: Insufficient documentation

## 2014-09-17 DIAGNOSIS — N61 Inflammatory disorders of breast: Secondary | ICD-10-CM | POA: Insufficient documentation

## 2014-09-17 DIAGNOSIS — Z452 Encounter for adjustment and management of vascular access device: Secondary | ICD-10-CM | POA: Diagnosis present

## 2014-09-17 HISTORY — PX: PORTACATH PLACEMENT: SHX2246

## 2014-09-17 HISTORY — PX: RE-EXCISION OF BREAST LUMPECTOMY: SHX6048

## 2014-09-17 IMAGING — CR DG CHEST 1V PORT
1 series · 1 of 1 positions shown · non-contrast
Comparison: None.

CLINICAL DATA: Postop, Port-A-Cath insertion

EXAM:
PORTABLE CHEST - 1 VIEW

[AP]
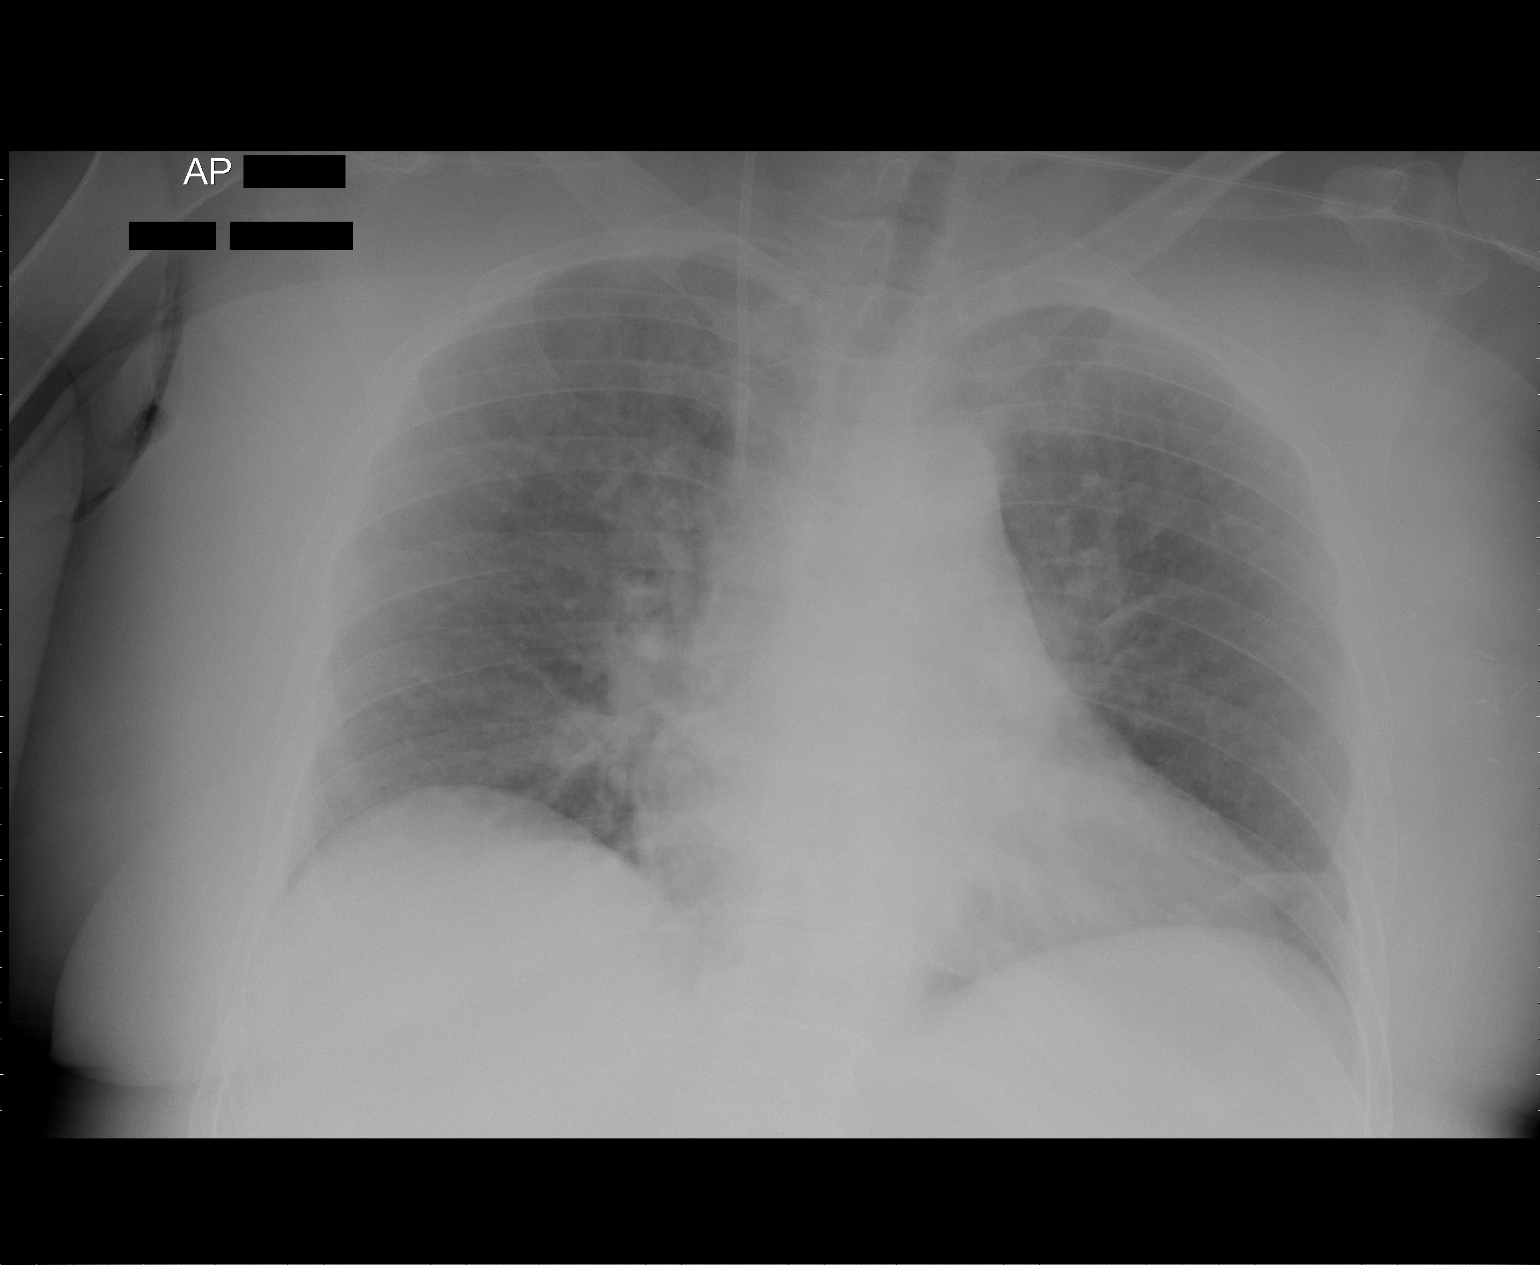

[1 of 1 positions shown; findings below may reference images not displayed]

FINDINGS: A right IJ Port-A-Cath has been placed with the tip overlying the
upper SVC. No pneumothorax is seen. Left basilar atelectasis or
scarring is noted. The heart is mildly enlarged.
IMPRESSION: Right-sided Port-A-Cath tip overlies the upper SVC. No pneumothorax.

## 2014-09-17 SURGERY — INSERTION, TUNNELED CENTRAL VENOUS DEVICE, WITH PORT
Anesthesia: General | Site: Neck | Laterality: Right

## 2014-09-17 MED ORDER — ALBUTEROL SULFATE (2.5 MG/3ML) 0.083% IN NEBU
2.5000 mg | INHALATION_SOLUTION | Freq: Four times a day (QID) | RESPIRATORY_TRACT | Status: DC | PRN
  Administered 2014-09-17: 2.5 mg via RESPIRATORY_TRACT

## 2014-09-17 MED ORDER — HEPARIN SOD (PORK) LOCK FLUSH 100 UNIT/ML IV SOLN
INTRAVENOUS | Status: AC
  Filled 2014-09-17: qty 5

## 2014-09-17 MED ORDER — 0.9 % SODIUM CHLORIDE (POUR BTL) OPTIME
TOPICAL | Status: DC | PRN
  Administered 2014-09-17: 250 mL

## 2014-09-17 MED ORDER — DEXAMETHASONE SODIUM PHOSPHATE 4 MG/ML IJ SOLN
INTRAMUSCULAR | Status: DC | PRN
  Administered 2014-09-17: 10 mg via INTRAVENOUS

## 2014-09-17 MED ORDER — LACTATED RINGERS IV SOLN
INTRAVENOUS | Status: DC
  Administered 2014-09-17 (×2): via INTRAVENOUS

## 2014-09-17 MED ORDER — HYDROMORPHONE HCL 1 MG/ML IJ SOLN
0.2500 mg | INTRAMUSCULAR | Status: DC | PRN
  Administered 2014-09-17: 0.5 mg via INTRAVENOUS

## 2014-09-17 MED ORDER — OXYCODONE-ACETAMINOPHEN 5-325 MG PO TABS: 1.0000 | ORAL_TABLET | ORAL | Status: DC | PRN

## 2014-09-17 MED ORDER — HEPARIN (PORCINE) IN NACL 2-0.9 UNIT/ML-% IJ SOLN
INTRAMUSCULAR | Status: AC
  Filled 2014-09-17: qty 500

## 2014-09-17 MED ORDER — FENTANYL CITRATE 0.05 MG/ML IJ SOLN
INTRAMUSCULAR | Status: DC | PRN
  Administered 2014-09-17: 25 ug via INTRAVENOUS
  Administered 2014-09-17: 50 ug via INTRAVENOUS
  Administered 2014-09-17: 25 ug via INTRAVENOUS

## 2014-09-17 MED ORDER — FENTANYL CITRATE 0.05 MG/ML IJ SOLN
INTRAMUSCULAR | Status: AC
  Filled 2014-09-17: qty 6

## 2014-09-17 MED ORDER — OXYCODONE HCL 5 MG PO TABS: 5.0000 mg | ORAL_TABLET | Freq: Once | ORAL | Status: DC | PRN

## 2014-09-17 MED ORDER — FENTANYL CITRATE 0.05 MG/ML IJ SOLN: 50.0000 ug | INTRAMUSCULAR | Status: DC | PRN

## 2014-09-17 MED ORDER — PROMETHAZINE HCL 25 MG/ML IJ SOLN: 6.2500 mg | INTRAMUSCULAR | Status: DC | PRN

## 2014-09-17 MED ORDER — MIDAZOLAM HCL 5 MG/5ML IJ SOLN
INTRAMUSCULAR | Status: DC | PRN
  Administered 2014-09-17: 2 mg via INTRAVENOUS

## 2014-09-17 MED ORDER — EPHEDRINE SULFATE 50 MG/ML IJ SOLN
INTRAMUSCULAR | Status: DC | PRN
  Administered 2014-09-17 (×3): 10 mg via INTRAVENOUS

## 2014-09-17 MED ORDER — ONDANSETRON HCL 4 MG/2ML IJ SOLN
INTRAMUSCULAR | Status: DC | PRN
  Administered 2014-09-17: 4 mg via INTRAVENOUS

## 2014-09-17 MED ORDER — CHLORHEXIDINE GLUCONATE 4 % EX LIQD: 1.0000 "application " | Freq: Once | CUTANEOUS | Status: DC

## 2014-09-17 MED ORDER — BUPIVACAINE-EPINEPHRINE 0.25% -1:200000 IJ SOLN
INTRAMUSCULAR | Status: DC | PRN
  Administered 2014-09-17: 30 mL

## 2014-09-17 MED ORDER — OXYCODONE HCL 5 MG/5ML PO SOLN: 5.0000 mg | Freq: Once | ORAL | Status: DC | PRN

## 2014-09-17 MED ORDER — CEFAZOLIN SODIUM-DEXTROSE 2-3 GM-% IV SOLR
2.0000 g | INTRAVENOUS | Status: AC
  Administered 2014-09-17: 2 g via INTRAVENOUS

## 2014-09-17 MED ORDER — ALBUTEROL SULFATE (2.5 MG/3ML) 0.083% IN NEBU
INHALATION_SOLUTION | RESPIRATORY_TRACT | Status: AC
  Filled 2014-09-17: qty 3

## 2014-09-17 MED ORDER — MIDAZOLAM HCL 2 MG/2ML IJ SOLN: 1.0000 mg | INTRAMUSCULAR | Status: DC | PRN

## 2014-09-17 MED ORDER — HEPARIN SOD (PORK) LOCK FLUSH 100 UNIT/ML IV SOLN
INTRAVENOUS | Status: DC | PRN
  Administered 2014-09-17: 500 [IU] via INTRAVENOUS

## 2014-09-17 MED ORDER — HEPARIN (PORCINE) IN NACL 2-0.9 UNIT/ML-% IJ SOLN
INTRAMUSCULAR | Status: DC | PRN
  Administered 2014-09-17: 1

## 2014-09-17 MED ORDER — PROPOFOL 10 MG/ML IV BOLUS
INTRAVENOUS | Status: DC | PRN
  Administered 2014-09-17: 200 mg via INTRAVENOUS

## 2014-09-17 MED ORDER — LIDOCAINE HCL (CARDIAC) 20 MG/ML IV SOLN
INTRAVENOUS | Status: DC | PRN
  Administered 2014-09-17: 50 mg via INTRAVENOUS

## 2014-09-17 MED ORDER — HYDROMORPHONE HCL 1 MG/ML IJ SOLN
INTRAMUSCULAR | Status: AC
  Filled 2014-09-17: qty 1

## 2014-09-17 MED ORDER — MIDAZOLAM HCL 2 MG/2ML IJ SOLN
INTRAMUSCULAR | Status: AC
  Filled 2014-09-17: qty 2

## 2014-09-17 SURGICAL SUPPLY — 73 items
APPLIER CLIP 9.375 MED OPEN (MISCELLANEOUS) ×3
BAG DECANTER FOR FLEXI CONT (MISCELLANEOUS) ×3 IMPLANT
BENZOIN TINCTURE PRP APPL 2/3 (GAUZE/BANDAGES/DRESSINGS) IMPLANT
BINDER BREAST LRG (GAUZE/BANDAGES/DRESSINGS) IMPLANT
BINDER BREAST MEDIUM (GAUZE/BANDAGES/DRESSINGS) IMPLANT
BINDER BREAST XLRG (GAUZE/BANDAGES/DRESSINGS) IMPLANT
BINDER BREAST XXLRG (GAUZE/BANDAGES/DRESSINGS) ×3 IMPLANT
BLADE HEX COATED 2.75 (ELECTRODE) IMPLANT
BLADE SURG 11 STRL SS (BLADE) ×3 IMPLANT
BLADE SURG 15 STRL LF DISP TIS (BLADE) ×2 IMPLANT
BLADE SURG 15 STRL SS (BLADE) ×3
CANISTER SUCT 1200ML W/VALVE (MISCELLANEOUS) ×3 IMPLANT
CHLORAPREP W/TINT 26ML (MISCELLANEOUS) ×3 IMPLANT
CLIP APPLIE 9.375 MED OPEN (MISCELLANEOUS) ×2 IMPLANT
CLIP TI WIDE RED SMALL 6 (CLIP) IMPLANT
COVER BACK TABLE 60X90IN (DRAPES) ×3 IMPLANT
COVER MAYO STAND STRL (DRAPES) ×3 IMPLANT
COVER PROBE 5X48 (MISCELLANEOUS) ×3
DECANTER SPIKE VIAL GLASS SM (MISCELLANEOUS) IMPLANT
DEVICE DUBIN W/COMP PLATE 8390 (MISCELLANEOUS) IMPLANT
DRAPE C-ARM 42X72 X-RAY (DRAPES) ×3 IMPLANT
DRAPE LAPAROSCOPIC ABDOMINAL (DRAPES) ×3 IMPLANT
DRAPE LAPAROTOMY 100X72 PEDS (DRAPES) IMPLANT
DRAPE UTILITY XL STRL (DRAPES) ×3 IMPLANT
DRSG TEGADERM 2-3/8X2-3/4 SM (GAUZE/BANDAGES/DRESSINGS) IMPLANT
ELECT COATED BLADE 2.86 ST (ELECTRODE) ×3 IMPLANT
ELECT REM PT RETURN 9FT ADLT (ELECTROSURGICAL) ×3
ELECTRODE REM PT RTRN 9FT ADLT (ELECTROSURGICAL) ×2 IMPLANT
GEL ULTRASOUND 8.5O AQUASONIC (MISCELLANEOUS) ×3 IMPLANT
GLOVE BIO SURGEON STRL SZ 6.5 (GLOVE) ×3 IMPLANT
GLOVE BIO SURGEON STRL SZ7 (GLOVE) ×3 IMPLANT
GLOVE BIOGEL PI IND STRL 7.5 (GLOVE) ×2 IMPLANT
GLOVE BIOGEL PI IND STRL 8 (GLOVE) ×2 IMPLANT
GLOVE BIOGEL PI INDICATOR 7.5 (GLOVE) ×1
GLOVE BIOGEL PI INDICATOR 8 (GLOVE) ×1
GLOVE ECLIPSE 6.5 STRL STRAW (GLOVE) ×3 IMPLANT
GLOVE ECLIPSE 8.0 STRL XLNG CF (GLOVE) ×3 IMPLANT
GOWN STRL REUS W/ TWL LRG LVL3 (GOWN DISPOSABLE) ×6 IMPLANT
GOWN STRL REUS W/TWL LRG LVL3 (GOWN DISPOSABLE) ×9
IV KIT MINILOC 20X1 SAFETY (NEEDLE) IMPLANT
KIT CVR 48X5XPRB PLUP LF (MISCELLANEOUS) ×2 IMPLANT
KIT MARKER MARGIN INK (KITS) ×3 IMPLANT
KIT PORT POWER 8FR ISP CVUE (Catheter) ×3 IMPLANT
LIQUID BAND (GAUZE/BANDAGES/DRESSINGS) ×6 IMPLANT
NDL SAFETY ECLIPSE 18X1.5 (NEEDLE) IMPLANT
NEEDLE HYPO 18GX1.5 SHARP (NEEDLE)
NEEDLE HYPO 22GX1.5 SAFETY (NEEDLE) IMPLANT
NEEDLE HYPO 25X1 1.5 SAFETY (NEEDLE) ×3 IMPLANT
NEEDLE SPNL 22GX3.5 QUINCKE BK (NEEDLE) IMPLANT
NS IRRIG 1000ML POUR BTL (IV SOLUTION) ×3 IMPLANT
PACK BASIN DAY SURGERY FS (CUSTOM PROCEDURE TRAY) ×3 IMPLANT
PENCIL BUTTON HOLSTER BLD 10FT (ELECTRODE) ×3 IMPLANT
SET SHEATH INTRODUCER 10FR (MISCELLANEOUS) IMPLANT
SHEATH COOK PEEL AWAY SET 9F (SHEATH) IMPLANT
SLEEVE SCD COMPRESS KNEE MED (MISCELLANEOUS) ×3 IMPLANT
SPONGE GAUZE 4X4 12PLY STER LF (GAUZE/BANDAGES/DRESSINGS) IMPLANT
SPONGE LAP 4X18 X RAY DECT (DISPOSABLE) ×3 IMPLANT
STAPLER VISISTAT 35W (STAPLE) IMPLANT
STRIP CLOSURE SKIN 1/2X4 (GAUZE/BANDAGES/DRESSINGS) IMPLANT
SUT MON AB 4-0 PC3 18 (SUTURE) ×6 IMPLANT
SUT PROLENE 2 0 CT2 30 (SUTURE) IMPLANT
SUT PROLENE 2 0 SH DA (SUTURE) ×3 IMPLANT
SUT SILK 2 0 SH (SUTURE) IMPLANT
SUT SILK 2 0 TIES 17X18 (SUTURE)
SUT SILK 2-0 18XBRD TIE BLK (SUTURE) IMPLANT
SUT VIC AB 3-0 SH 27 (SUTURE) ×3
SUT VIC AB 3-0 SH 27X BRD (SUTURE) ×2 IMPLANT
SYR 5ML LUER SLIP (SYRINGE) ×3 IMPLANT
SYR CONTROL 10ML LL (SYRINGE) ×3 IMPLANT
TOWEL OR 17X24 6PK STRL BLUE (TOWEL DISPOSABLE) ×6 IMPLANT
TOWEL OR NON WOVEN STRL DISP B (DISPOSABLE) ×3 IMPLANT
TUBE CONNECTING 20X1/4 (TUBING) ×3 IMPLANT
YANKAUER SUCT BULB TIP NO VENT (SUCTIONS) ×3 IMPLANT

## 2014-09-17 NOTE — Anesthesia Postprocedure Evaluation (Signed)
Anesthesia Post Note  Patient: Regina Mcmahon  Procedure(s) Performed: Procedure(s) (LRB): INSERTION PORT-A-CATH WITH ULTRASOUND (Right) RE-EXCISION OF BREAST LUMPECTOMY (Left)  Anesthesia type: General  Patient location: PACU  Post pain: Pain level controlled  Post assessment: Post-op Vital signs reviewed  Last Vitals: BP 129/71 mmHg  Pulse 83  Temp(Src) 36.6 C (Oral)  Resp 12  Ht 5\' 1"  (1.549 m)  Wt 216 lb (97.977 kg)  BMI 40.83 kg/m2  SpO2 99%  Post vital signs: Reviewed  Level of consciousness: sedated  Complications: No apparent anesthesia complications

## 2014-09-17 NOTE — Discharge Instructions (Signed)
IMPLANTED Implanted [facility] Guide An implanted port is a type of central line that is placed under the skin. Central lines are used to provide IV access when treatment or nutrition needs to be given through a person's veins. Implanted ports are used for long-term IV access. An implanted port may be placed because:   You need IV medicine that would be irritating to the small veins in your hands or arms.   You need long-term IV medicines, such as antibiotics.   You need IV nutrition for a long period.   You need frequent blood draws for lab tests.   You need dialysis.  Implanted ports are usually placed in the chest area, but they can also be placed in the upper arm, the abdomen, or the leg. An implanted port has two main parts:   Reservoir. The reservoir is round and will appear as a small, raised area under your skin. The reservoir is the part where a needle is inserted to give medicines or draw blood.   Catheter. The catheter is a thin, flexible tube that extends from the reservoir. The catheter is placed into a large vein. Medicine that is inserted into the reservoir goes into the catheter and then into the vein.  HOW WILL I CARE FOR MY INCISION SITE? Do not get the incision site wet. Bathe or shower as directed by your health care provider.  HOW IS MY PORT ACCESSED? Special steps must be taken to access the port:   Before the port is accessed, a numbing cream can be placed on the skin. This helps numb the skin over the port site.   Your health care provider uses a sterile technique to access the port.  Your health care provider must put on a mask and sterile gloves.  The skin over your port is cleaned carefully with an antiseptic and allowed to dry.  The port is gently pinched between sterile gloves, and a needle is inserted into the port.  Only "non-coring" port needles should be used to access the port. Once the port is accessed, a blood return should be checked. This  helps ensure that the port is in the vein and is not clogged.   If your port needs to remain accessed for a constant infusion, a clear (transparent) bandage will be placed over the needle site. The bandage and needle will need to be changed every week, or as directed by your health care provider.   Keep the bandage covering the needle clean and dry. Do not get it wet. Follow your health care provider's instructions on how to take a shower or bath while the port is accessed.   If your port does not need to stay accessed, no bandage is needed over the port.  WHAT IS FLUSHING? Flushing helps keep the port from getting clogged. Follow your health care provider's instructions on how and when to flush the port. Ports are usually flushed with saline solution or a medicine called heparin. The need for flushing will depend on how the port is used.   If the port is used for intermittent medicines or blood draws, the port will need to be flushed:   After medicines have been given.   After blood has been drawn.   As part of routine maintenance.   If a constant infusion is running, the port may not need to be flushed.  HOW LONG WILL MY PORT STAY IMPLANTED? The port can stay in for as long as your health  care provider thinks it is needed. When it is time for the port to come out, surgery will be done to remove it. The procedure is similar to the one performed when the port was put in.  WHEN SHOULD I SEEK IMMEDIATE MEDICAL CARE? When you have an implanted port, you should seek immediate medical care if:   You notice a bad smell coming from the incision site.   You have swelling, redness, or drainage at the incision site.   You have more swelling or pain at the port site or the surrounding area.   You have a fever that is not controlled with medicine. Document Released: 07/11/2005 Document Revised: 05/01/2013 Document Reviewed: 03/18/2013 [facility] Patient Information 2015 [place], [place].  This information is not intended to replace advice given to you by your health care provider. Make sure you discuss any questions you have with your health care provider.    Post Anesthesia Home Care Instructions  Activity: Get plenty of rest for the remainder of the day. A responsible adult should stay with you for 24 hours following the procedure.  For the next 24 hours, DO NOT: -Drive a car -Paediatric nurse -Drink alcoholic beverages -Take any medication unless instructed by your physician -Make any legal decisions or sign important papers.  Meals: Start with liquid foods such as gelatin or soup. Progress to regular foods as tolerated. Avoid greasy, spicy, heavy foods. If nausea and/or vomiting occur, drink only clear liquids until the nausea and/or vomiting subsides. Call your physician if vomiting continues.  Special Instructions/Symptoms: Your throat may feel dry or sore from the anesthesia or the breathing tube placed in your throat during surgery. If this causes discomfort, gargle with warm salt water. The discomfort should disappear within 24 hours.   Call your surgeon if you experience:   1.  Fever over 101.0. 2.  Inability to urinate. 3.  Nausea and/or vomiting. 4.  Extreme swelling or bruising at the surgical site. 5.  Continued bleeding from the incision. 6.  Increased pain, redness or drainage from the incision. 7.  Problems related to your pain medication. 8. Any change in color, movement and/or sensation 9. Any problems and/or concerns

## 2014-09-17 NOTE — Op Note (Signed)
Regina Mcmahon [birth year] [id number] 09/17/2014   Preoperative diagnosis: PAC needed  Postoperative diagnosis: Same  Procedure: Portacath Placement  Surgeon: Erroll Luna, MD, FACS  Anesthesia: General and 0.25 % bupivicaine with epi   Clinical History and Indications: The patient is getting ready to begin chemotherapy for her cancer. She  needs a Port-A-Cath for venous access.  Description of Procedure: I have seen the patient in the holding area and confirmed the plans for the procedure as noted above. I reviewed the risks and complications again and the patient has no further questions. She wishes to proceed.   The patient was then taken to the operating room. After satisfactory LMA  anesthesia had been obtained the upper chest and lower neck were prepped and draped as a sterile field. The timeout was done.  The right INTERNAL JUGULAR  vein was entered under U/S guidance  and the guidewire threaded into the superior vena cava right atrial area under fluoroscopic guidance. An incision was then made on the anterior chest wall and a subcutaneous pocket fashioned for the port reservoir.  The port tubing was then brought through a subcutaneous tunnel from the port site to the guidewire site. The dilator and peel-away sheath were then advanced over the guidewire while monitoring this with fluoroscopy. The guidewire and dilator were removed and the tubing threaded to approximately 22 cm. The peel-away sheath was then removed. The catheter aspirated and flushed easily. Using fluoroscopy the tip was backed out into the superior vena cava right atrial junction area. It aspirated and flushed easily. The reservoir was attached and the locking mechanism engaged. That aspirated and flushed easily.  The reservoir was secured to the fascia with 2 sutures of 2-0 Prolene. A final check with fluoroscopy was done to make sure we had no kinks and good positioning of the tip of the catheter. Everything  appeared to be okay. The catheter was aspirated, flushed with dilute heparin and then concentrated aqueous heparin.  The incision was then closed with interrupted 3-0 Vicryl, and 4-0 Monocryl subcuticular with Dermabond on the skin.   Breast Re-excison Lumpectomy Procedure Note left   Indications:  This patient returns following an initial lumpectomy.  Analysis of the pathology specimen revealed microscopic involvement of the lateral and anterior margins.  The patient now returns for re-excision.  Pre-operative Diagnosis: left breast cancer  Post-operative Diagnosis: left breast cancer  Surgeon: Erroll Luna A.   Assistants: none  Anesthesia: General LMA anesthesia and Local anesthesia 0.25.% bupivacaine, with epinephrine  ASA Class: 3  Procedure Details  The patient was seen in the Holding Room. The risks, benefits, complications, treatment options, and expected outcomes were discussed with the patient. The possibilities of reaction to medication, pulmonary aspiration, bleeding, infection, the need for additional procedures, failure to diagnose a condition, and creating a complication requiring transfusion or operation were discussed with the patient. The patient concurred with the proposed plan, giving informed consent.  The site of surgery properly noted/marked. The patient was taken to Operating Room # 8, identified as Regina Mcmahon and the procedure verified as Breast Re-excision Lumpectomy. A Time Out was held and the above information confirmed.  the patient was placed supine.  The breast was prepped and draped in standard fashion. Bupivicaine 0.25% with epinephrine was used to anesthetize the skin around the previous lumpectomy incision.  The incision was opened.  A  seroma was evacuated.  Additional local anesthesia was delivered medial, lateral, posterior, anterior, inferior and superiorly within the lumpectomy cavity.  A full thickness re-excision was performed.  An orientation  ink  was placed on new surgical margins.  The new margins were  inked and the specimen was submitted to pathology.  Hemostasis was achieved with cautery.  Closure was performed in 2 layers with a 4-0 monocryl  subcuticular closure.    Liquid adhesive  applied.  At the end of the operation, all sponge, instrument and needle counts were correct.   Findings: grossly clear surgical margins  Estimated Blood Loss:  Minimal                 Total IV Fluids: 600 ml         Specimens: margins all                  Complications:  None; patient tolerated the procedure well.         Disposition: PACU - hemodynamically stable.         Condition: stable  Attending Attestation: I performed the procedure.

## 2014-09-17 NOTE — H&P (View-Only) (Signed)
H&P   Regina Mcmahon (MR# [id number])      H&P Info    Author Note Status Last Update User Last Update Date/Time   Erroll Luna, MD Signed Erroll Luna, MD 07/07/2014 3:55 PM    H&P    Expand All Collapse All   Regina Mcmahon 07/07/2014 2:41 PM Location: [facility] Patient #: [id number] DOB: 06-06-1951 Single / Language: [name] / Race: White Female History of Present Illness [name] A. Katia Hannen MD; 07/07/2014 3:48 PM) Patient words: lt. breast check  Patient sent at the request of Dr. [name] of the [facility] for left breast cancer. Patient noticed mass in left breast upper outer quadrant 1 month ago. 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 Patient developed fullness in the left breast that she detected. Mammography in an option performed with biopsy which shows a low-grade invasive mammary carcinoma favoring lobular. Receptors pending. Patient has some soreness of the biopsy site in her left breast. No history of nipple drainage. She states her nipple was inverted but this has improved since the biopsy.       CLINICAL DATA: Mass with tenderness felt by the patient in the upper outer left breast.  EXAM: DIGITAL DIAGNOSTIC BILATERAL MAMMOGRAM WITH CAD  ULTRASOUND LEFT BREAST  COMPARISON: Previous examinations, the most recent dated 11/14/2012, 11/07/2012 and 10/23/2012.  ACR Breast Density Category b: There are scattered areas of fibroglandular density.  FINDINGS: Interval approximately 6 x 3 x 2.5 cm area of ill-defined increased density in the upper outer left breast, corresponding to the mass felt by the patient, marked with a metallic marker. This is not at the location of the previously biopsied benign calcifications. No findings elsewhere in either breast suspicious for malignancy.  Mammographic images were processed with CAD.  On physical exam, the  patient has an approximately 2.5 x 1.5 cm oval area of irregular, palpable soft tissue thickening in the 2 o'clock position of the left breast, centered 7 cm from the nipple. There are no palpable left axillary lymph nodes.  Ultrasound is performed, showing an ill-defined predominantly hypoechoic area with some increased echogenicity in the 2 o'clock position of the left breast, 7 cm from the nipple. This has irregular margins and measures approximately 2.6 x 2.3 x 1.8 cm in maximum dimensions. At the anterior, superolateral aspect of this abnormality, this is slightly more focally hypoechoic and irregular, measuring 9 x 7 x 6 mm.  Ultrasound of the left axilla demonstrated a normal appearing left axillary lymph node with no abnormal appearing nodes seen.  IMPRESSION: Irregular, heterogeneous mass-like area in the 2 o'clock position of the left breast, corresponding to the palpable mass, as described above. This has imaging features suspicious for malignancy.  RECOMMENDATION: Ultrasound-guided core needle biopsy of the irregular mass-like area in the 2 o'clock position of the left breast. This has been discussed with the patient and scheduled for 1 p.m. on 07/01/2014.  I have discussed the findings and recommendations with the patient. Results were also provided in writing at the conclusion of the visit. If applicable, a reminder letter will be sent to the patient regarding the next appointment.  BI-RADS CATEGORY 5: Highly suggestive of malignancy.   Electronically Signed By: Enrique Sack M.D.          Diagnosis Breast, left, needle core biopsy, 2 o'clock INVASIVE MAMMARY CARCINOMA, PLEASE SEE COMMENT. Microscopic Comment Although the type and grade is best determined at the  time of excision, there is an invasive mammary carcinoma with lobular features that appears to be low grade. An E-cadherin will be performed to further classify this malignancy and an addendum  will follow. There is no evidence of angiolymphatic invasion identified in this material. The longest length involved by tumor measures 1.0 cm from the glass slide. A breast prognostic profile will be performed and an addendum will follow. [name] MD.  The patient is a 64 year old female   Other Problems Briant Cedar, [place]; 07/07/2014 2:41 PM) Anxiety Disorder Asthma Breast Cancer Cholelithiasis Chronic Obstructive Lung Disease Depression High blood pressure Lump In Breast  Past Surgical History Briant Cedar, [place]; 07/07/2014 2:41 PM) Breast Biopsy Left. Cesarean Section - 1 Colon Polyp Removal - Colonoscopy Gallbladder Surgery - Laparoscopic  Diagnostic Studies History Briant Cedar, [place]; 07/07/2014 2:41 PM) Colonoscopy 5-10 years ago Mammogram within last year Pap Smear 1-5 years ago  Allergies Briant Cedar, [place]; 07/07/2014 2:42 PM) No Known Drug Allergies 07/07/2014  Medication History Briant Cedar, CMA; 07/07/2014 2:42 PM) Lisinopril-Hydrochlorothiazide (10-12.5MG  Tablet, Oral) Active.  Social History Briant Cedar, [place]; 07/07/2014 2:41 PM) Caffeine use Carbonated beverages. No alcohol use No drug use Tobacco use Current every day smoker.  Family History Briant Cedar, [place]; 07/07/2014 2:41 PM) Colon Cancer Brother. Diabetes Mellitus Mother. Heart disease in female family member before age 41 Heart disease in female family member before age 68 Ovarian Cancer Mother.  Pregnancy / Birth History Briant Cedar, CMA; 07/07/2014 2:41 PM) Age at menarche 66 years. Age of menopause <47 Contraceptive History Oral contraceptives. Gravida 2 Irregular periods Maternal age 32-20 Para 2     Review of Systems Briant Cedar CMA; 07/07/2014 2:41 PM) General Present- Fatigue. Not Present- Appetite Loss, Chills, Fever, Night Sweats, Weight Gain and Weight Loss. HEENT Present- Seasonal Allergies. Not  Present- Earache, Hearing Loss, Hoarseness, Nose Bleed, Oral Ulcers, Ringing in the Ears, Sinus Pain, Sore Throat, Visual Disturbances, Wears glasses/contact lenses and Yellow Eyes. Respiratory Present- Chronic Cough, Difficulty Breathing, Snoring and Wheezing. Not Present- Bloody sputum. Breast Present- Breast Mass and Breast Pain. Not Present- Nipple Discharge and Skin Changes. Cardiovascular Present- Chest Pain, Difficulty Breathing Lying Down, Rapid Heart Rate, Shortness of Breath and Swelling of Extremities. Not Present- Leg Cramps and Palpitations. Gastrointestinal Present- Chronic diarrhea, Hemorrhoids, Indigestion and Rectal Pain. Not Present- Abdominal Pain, Bloating, Bloody Stool, Change in Bowel Habits, Constipation, Difficulty Swallowing, Excessive gas, Gets full quickly at meals, Nausea and Vomiting. Female Genitourinary Present- Frequency, Nocturia and Urgency. Not Present- Painful Urination and Pelvic Pain. Musculoskeletal Present- Back Pain. Not Present- Joint Pain, Joint Stiffness, Muscle Pain, Muscle Weakness and Swelling of Extremities. Neurological Present- Weakness. Not Present- Decreased Memory, Fainting, Headaches, Numbness, Seizures, Tingling, Tremor and Trouble walking. Psychiatric Present- Anxiety, Depression, Fearful and Frequent crying. Not Present- Bipolar and Change in Sleep Pattern. Hematology Present- Easy Bruising. Not Present- Excessive bleeding, Gland problems, HIV and Persistent Infections.  Vitals Briant Cedar CMA; 07/07/2014 2:43 PM) 07/07/2014 2:42 PM Weight: 224 lb Height: 65in Body Surface Area: 2.16 m Body Mass Index: 37.28 kg/m Temp.: 97.77F  Pulse: 94 (Regular)  BP: 158/90 (Sitting, Left Arm, Standard)     Physical Exam (Julia Alkhatib A. Ricca Melgarejo MD; 07/07/2014 3:50 PM)  General Mental Status-Alert. General Appearance-Consistent with stated age. Hydration-Well hydrated. Voice-Normal.  Head and Neck Head-normocephalic,  atraumatic with no lesions or palpable masses. Trachea-midline. Thyroid Gland Characteristics - normal size and consistency.  Chest and Lung Exam Auscultation Breath sounds - Hissing sound -  Both Lung Fields.  Breast Note: 2 cm mobile left breast UOQ bruising and swelling from biopsy site. no nipple discharge. right breast normal without mass   Cardiovascular Cardiovascular examination reveals -normal heart sounds, regular rate and rhythm with no murmurs and normal pedal pulses bilaterally.  Neurologic Neurologic evaluation reveals -alert and oriented x 3 with no impairment of recent or remote memory. Mental Status-Normal.  Musculoskeletal Normal Exam - Left-Upper Extremity Strength Normal and Lower Extremity Strength Normal. Normal Exam - Right-Upper Extremity Strength Normal and Lower Extremity Strength Normal.  Lymphatic Head & Neck  General Head & Neck Lymphatics: Bilateral - Description - Normal. Axillary  General Axillary Region: Bilateral - Description - Normal. Tenderness - Non Tender.    Assessment & Plan (Evianna Chandran A. Natividad Schlosser MD; 07/07/2014 3:52 PM)  BREAST CANCER, LEFT (174.9  C50.912) Impression: discussed mastectomy with reconstruction vs left breast seed localized lumpectomy with SLN mapping. pro and cons of each discussed. Risk and benefits as well as other possible treatments discussed. She would like to proceed with breast conservation. Risk of lumpectomy include bleeding, infection, seroma, more surgery, use of seed/wire, wound care, cosmetic deformity and the need for other treatments, death , blood clots, death. Pt agrees to proceed. Risk of sentinel lymph node mapping include bleeding, infection, lymphedema, shoulder pain. stiffness, dye allergy. cosmetic deformity , blood clots, death, need for more surgery. Pt agres to proceed.  Current Plans Referred to Genetic Counseling, for evaluation and follow up (Medical Genetics). Referred to  Oncology, for evaluation and follow up (Oncology). Referred to Radiation Oncology, for evaluation and follow up (Radiation Oncology). Referred to Physical Therapy, for evaluation and follow up (Physical Therapy). Pt Education - Breast Cancer in Women *: breast cancer Pt Education - Breast Cancer in Women *: tumors Pt Education - Breast Cancer: Follow-up After Surgery: breast CCS Consent - BASIC (Gross): discussed with patient and provided information. Pt Education - CSS Breast Biopsy Instructions (FLB): discussed with patient and provided information

## 2014-09-17 NOTE — Anesthesia Procedure Notes (Signed)
Procedure Name: LMA Insertion Date/Time: 09/17/2014 11:07 AM Performed by: Baxter Flattery Pre-anesthesia Checklist: Patient identified, Emergency Drugs available, Suction available and Patient being monitored Patient Re-evaluated:Patient Re-evaluated prior to inductionOxygen Delivery Method: Circle System Utilized Preoxygenation: Pre-oxygenation with 100% oxygen Intubation Type: IV induction Ventilation: Mask ventilation without difficulty LMA: LMA inserted LMA Size: 4.0 Number of attempts: 1 Airway Equipment and Method: Bite block Placement Confirmation: positive ETCO2 and breath sounds checked- equal and bilateral Tube secured with: Tape Dental Injury: Teeth and Oropharynx as per pre-operative assessment

## 2014-09-17 NOTE — Transfer of Care (Signed)
Immediate Anesthesia Transfer of Care Note  Patient: Regina Mcmahon  Procedure(s) Performed: Procedure(s) with comments: RE-EXCISION OF BREAST LUMPECTOMY (Left) INSERTION PORT-A-CATH WITH ULTRASOUND (Right) - IJ  Patient Location: PACU  Anesthesia Type:General  Level of Consciousness: awake, sedated and responds to stimulation  Airway & Oxygen Therapy: Patient Spontanous Breathing and Patient connected to face mask oxygen  Post-op Assessment: Report given to RN, Post -op Vital signs reviewed and stable and Patient moving all extremities  Post vital signs: Reviewed and stable  Last Vitals:  Filed Vitals:   09/17/14 0954  BP: 130/78  Pulse: 76  Temp: 36.8 C  Resp: 18    Complications: No apparent anesthesia complications

## 2014-09-17 NOTE — Anesthesia Preprocedure Evaluation (Addendum)
Anesthesia Evaluation  Patient identified by MRN, date of birth, ID band Patient awake    Reviewed: Allergy & Precautions, NPO status , Patient's Chart, lab work & pertinent test results  History of Anesthesia Complications Negative for: history of anesthetic complications  Airway Mallampati: II  TM Distance: >3 FB Neck ROM: Full    Dental  (+) Edentulous Upper, Dental Advisory Given   Pulmonary asthma , COPD COPD inhaler, Current Smoker,    Pulmonary exam normal       Cardiovascular hypertension, Pt. on medications     Neuro/Psych PSYCHIATRIC DISORDERS Anxiety Depression    GI/Hepatic negative GI ROS, Neg liver ROS,   Endo/Other  negative endocrine ROS  Renal/GU negative Renal ROS     Musculoskeletal   Abdominal   Peds  Hematology   Anesthesia Other Findings   Reproductive/Obstetrics                            Anesthesia Physical Anesthesia Plan  ASA: III  Anesthesia Plan: General   Post-op Pain Management:    Induction: Intravenous  Airway Management Planned: LMA  Additional Equipment:   Intra-op Plan:   Post-operative Plan: Extubation in OR  Informed Consent: I have reviewed the patients History and Physical, chart, labs and discussed the procedure including the risks, benefits and alternatives for the proposed anesthesia with the patient or authorized representative who has indicated his/her understanding and acceptance.   Dental advisory given  Plan Discussed with: CRNA, Anesthesiologist and Surgeon  Anesthesia Plan Comments:        Anesthesia Quick Evaluation

## 2014-09-17 NOTE — Interval H&P Note (Signed)
History and Physical Interval Note:  09/17/2014 9:33 AM  Regina Mcmahon  has presented today for surgery, with the diagnosis of Left Breast Cancer  The various methods of treatment have been discussed with the patient and family. After consideration of risks, benefits and other options for treatment, the patient has consented to  Procedure(s): RE-EXCISION OF BREAST LUMPECTOMY (Left) INSERTION PORT-A-CATH WITH ULTRASOUND (N/A) as a surgical intervention .  The patient's history has been reviewed, patient examined, no change in status, stable for surgery.  I have reviewed the patient's chart and labs.  Questions were answered to the patient's satisfaction.     Pt will not need a ALND at this point after discussion in [place]. WILL RE EXCISE AND PUT PORT IN FOR CHEMOTHERAPY.     [name] A.

## 2014-09-23 ENCOUNTER — Encounter (HOSPITAL_BASED_OUTPATIENT_CLINIC_OR_DEPARTMENT_OTHER): Payer: Self-pay | Admitting: Surgery

## 2014-09-24 ENCOUNTER — Encounter (HOSPITAL_COMMUNITY): Payer: 59 | Attending: Hematology & Oncology | Admitting: Hematology & Oncology

## 2014-09-24 ENCOUNTER — Encounter (HOSPITAL_COMMUNITY): Payer: Self-pay | Admitting: Hematology & Oncology

## 2014-09-24 VITALS — BP 142/90 | HR 81 | Temp 97.5°F | Resp 18

## 2014-09-24 DIAGNOSIS — Z170 Estrogen receptor positive status [ER+]: Secondary | ICD-10-CM

## 2014-09-24 DIAGNOSIS — C50412 Malignant neoplasm of upper-outer quadrant of left female breast: Secondary | ICD-10-CM | POA: Diagnosis not present

## 2014-09-24 DIAGNOSIS — R319 Hematuria, unspecified: Secondary | ICD-10-CM | POA: Insufficient documentation

## 2014-09-24 DIAGNOSIS — C773 Secondary and unspecified malignant neoplasm of axilla and upper limb lymph nodes: Secondary | ICD-10-CM

## 2014-09-24 DIAGNOSIS — E876 Hypokalemia: Secondary | ICD-10-CM | POA: Insufficient documentation

## 2014-09-24 DIAGNOSIS — C50912 Malignant neoplasm of unspecified site of left female breast: Secondary | ICD-10-CM

## 2014-09-24 NOTE — Patient Instructions (Addendum)
..  [place] at [facility] Discharge Instructions  RECOMMENDATIONS MADE BY THE CONSULTANT AND ANY TEST RESULTS WILL BE SENT TO YOUR REFERRING PHYSICIAN. Chemo teaching Tuesday at 10 am with Hildred Alamin We will plan on chemo next week on Thursday Cytoxan Methotrexate 5 fluorouracil You will get a Neulasta shot on Friday afternoon    Thank you for choosing [place] at [facility] to provide your oncology and hematology care.  To afford each patient quality time with our provider, please arrive at least 15 minutes before your scheduled appointment time.    You need to re-schedule your appointment should you arrive 10 or more minutes late.  We strive to give you quality time with our providers, and arriving late affects you and other patients whose appointments are after yours.  Also, if you no show three or more times for appointments you may be dismissed from the clinic at the providers discretion.     Again, thank you for choosing [facility].  Our hope is that these requests will decrease the amount of time that you wait before being seen by our physicians.       _____________________________________________________________  Should you have questions after your visit to [facility], please contact our office at [phone] between the hours of 8:30 a.m. and 4:30 p.m.  Voicemails left after 4:30 p.m. will not be returned until the following business day.  For prescription refill requests, have your pharmacy contact our office.

## 2014-09-25 ENCOUNTER — Telehealth (HOSPITAL_COMMUNITY): Payer: Self-pay | Admitting: Hematology & Oncology

## 2014-09-25 NOTE — Telephone Encounter (Signed)
PC TO [phone] ?'D [phone] REQUIRED AUTH PER JAMIE NONE REQUIRE AUTH BUT MUST MEET MED NEC CALL REF# 09/25/2014 [id number]

## 2014-09-26 ENCOUNTER — Other Ambulatory Visit (HOSPITAL_COMMUNITY): Payer: Self-pay | Admitting: Hematology & Oncology

## 2014-09-26 MED ORDER — PROCHLORPERAZINE MALEATE 10 MG PO TABS: 10.0000 mg | ORAL_TABLET | Freq: Four times a day (QID) | ORAL | Status: DC | PRN

## 2014-09-26 MED ORDER — ONDANSETRON HCL 8 MG PO TABS: ORAL_TABLET | ORAL | Status: DC

## 2014-09-26 MED ORDER — LIDOCAINE-PRILOCAINE 2.5-2.5 % EX CREA: TOPICAL_CREAM | CUTANEOUS | Status: DC

## 2014-09-26 NOTE — Patient Instructions (Addendum)
[place]   CHEMOTHERAPY INSTRUCTIONS  Cytoxan is a chemotherapy drug. Cytoxan can cause nausea and vomiting and of course hair loss. It can also cause hemorrhagic cystitis (bloody urine) because the chemo irritates your bladder. You need to push fluids when on this chemo, preferably water(64 oz a day). Do not hold your urine. Urinate before you go to bed and if you wake up during the night go to the bathroom.  Methotrexate - mouth sores, nausea, bone marrow suppression (drives healthy blood cells down), kidney toxicity, sensitivity to light, liver toxicity, oral or GI ulceration. Drug is yellow in color so your urine may be darker. You must perform strict oral mouth care. Wear sunscreen of 30 or greater and reapply every 2 hours or more if you have been swimming. Wear wide brimmed hats to protect your head and ears from sunlight. No sun bathing! You must NOT take any folic acid or vitamins with folic acid in them for the duration of this treatment.    5FU: bone marrow suppression (low white blood cells - wbcs fight infection, low red blood cells - rbcs make up your blood, low platelets - this is what makes your blood clot, nausea/vomiting, diarrhea, mouth sores, hair loss, dry skin, ocular toxicities (increased tear production, sensitivity to light). You must wear sunscreen/sunglasses. Cover your skin when out in sunlight. You will get burned very easily.    Neulasta - this medication is not chemo but being given because you have had chemo. It is usually given 24-48 hours after the completion of chemotherapy. This medication works by boosting your bone marrow's supply of white blood cells. White blood cells are what protect our bodies against infection. The medication is given in the form of a subcutaneous injection. It is given in the fatty tissue of your abdomen. It is a short needle. The major side effect of this medication is bone or muscle pain. The drug of  choice to relieve or lessen the pain is Aleve or Ibuprofen. If a physician has ever told you not to take Aleve or Ibuprofen - then don't take it. You should then take Tylenol/acetaminophen. Take either medication as the bottle directs you to.  The level of pain you experience as a result of this injection can range from none, to mild or moderate, or severe. Please let us know if you develop moderate or severe bone pain.      POTENTIAL SIDE EFFECTS OF TREATMENT: Increased Susceptibility to Infection, Vomiting, Constipation, Hair Thinning, Changes in Character of Skin and Nails (brittleness, dryness,etc.), Bone Marrow Suppression, Nausea, Diarrhea, Sun Sensitivity and Mouth Sores   EDUCATIONAL MATERIALS GIVEN AND REVIEWED: Chemotherapy and You Booklet and Specific Instructions Sheets: Cytoxan, 5FU, Methotrexate, Neulasta, Aloxi, Emend, Dexamethasone, EMLA, Zofran, Compazine   SELF CARE ACTIVITIES WHILE ON CHEMOTHERAPY: Increase your fluid intake 48 hours prior to treatment and drink at least 2 quarts per day after treatment., No alcohol intake., No aspirin or other medications unless approved by your oncologist., Eat foods that are light and easy to digest., Eat foods at cold or room temperature., No fried, fatty, or spicy foods immediately before or after treatment., Have teeth cleaned professionally before starting treatment. Keep dentures and partial plates clean., Use soft toothbrush and do not use mouthwashes that contain alcohol. Biotene is a good mouthwash that is available at most pharmacies or may be ordered by calling [phone]., Use warm salt water gargles (1 teaspoon salt per 1 quart warm water)  before and after meals and at bedtime. Or you may rinse with 2 tablespoons of three -percent hydrogen peroxide mixed in eight ounces of water., Always use sunscreen with SPF (Sun Protection Factor) of 30 or higher., Use your nausea medication as directed to prevent nausea., Use your stool  softener or laxative as directed to prevent constipation. and Use your anti-diarrheal medication as directed to stop diarrhea.  Please wash your hands for at least 30 seconds using warm soapy water. Handwashing is the #1 way to prevent the spread of germs. Stay away from sick people or people who are getting over a cold. If you develop respiratory systems such as green/yellow mucus production or productive cough or persistent cough let us know and we will see if you need an antibiotic. It is a good idea to keep a pair of gloves on when going into grocery stores/[employer] to decrease your risk of coming into contact with germs on the carts, etc. Carry alcohol hand gel with you at all times and use it frequently if out in public. All foods need to be cooked thoroughly. No raw foods. No medium or undercooked meats, eggs. If your food is cooked medium well, it does not need to be hot pink or saturated with bloody liquid at all. Vegetables and fruits need to be washed/rinsed under the faucet with a dish detergent before being consumed. You can eat raw fruits and vegetables unless we tell you otherwise but it would be best if you cooked them or bought frozen. Do not eat off of salad bars or hot bars unless you really trust the cleanliness of the restaurant. If you need dental work, please let Dr. Whitney Muse know before you go for your appointment so that we can coordinate the best possible time for you in regards to your chemo regimen. You need to also let your dentist know that you are actively taking chemo. We may need to do labs prior to your dental appointment. We also want your bowels moving at least every other day. If this is not happening, we need to know so that we can get you on a bowel regimen to help you go.       MEDICATIONS: You have been given prescriptions for the following medications:  Zofran 8mg  tablet. Take 1 tab every 8 hours as needed for nausea/vomiting.  Compazine 10mg  tablet. Take 1 tab  every 6 hours as needed for nausea/vomiting.   EMLA cream. Apply a quarter size amount to port site 1 hour prior to chemo. Do not rub in. Cover with plastic wrap.  Over-the-Counter Meds:  Senna - this is a mild laxative used to treat mild constipation. May take 1-8 tabs by mouth daily as needed for mild constipation.  Milk of Magnesia - this is a laxative used to treat moderate to severe constipation. May take 2-4 tablespoons every 8 hours as needed. May increase to 8 tablespoons x 1 dose and if no bowel movement call the [facility].  Imodium - this is for diarrhea. Take 2 tabs after 1st loose stool and then 1 tab every 2 hours until you go a total of 12 hours without a loose stool. Call [facility] if loose stools continue.   SYMPTOMS TO REPORT AS SOON AS POSSIBLE AFTER TREATMENT:  FEVER GREATER THAN 100.5 F  CHILLS WITH OR WITHOUT FEVER  NAUSEA AND VOMITING THAT IS NOT CONTROLLED WITH YOUR NAUSEA MEDICATION  UNUSUAL SHORTNESS OF BREATH  UNUSUAL BRUISING OR BLEEDING  TENDERNESS IN  MOUTH AND THROAT WITH OR WITHOUT PRESENCE OF ULCERS  URINARY PROBLEMS  BOWEL PROBLEMS  UNUSUAL RASH    Wear comfortable clothing and clothing appropriate for easy access to any Portacath or PICC line. Let us know if there is anything that we can do to make your therapy better!      I have been informed and understand all of the instructions given to me and have received a copy. I have been instructed to call the clinic [phone] or my family physician as soon as possible for continued medical care, if indicated. I do not have any more questions at this time but understand that I may call the [facility] or the Patient Navigator at [phone] during office hours should I have questions or need assistance in obtaining follow-up care.           Cyclophosphamide injection What is this medicine? CYCLOPHOSPHAMIDE (sye kloe FOSS fa mide) is a chemotherapy drug. It slows the  growth of cancer cells. This medicine is used to treat many types of cancer like lymphoma, myeloma, leukemia, breast cancer, and ovarian cancer, to name a few. This medicine may be used for other purposes; ask your health care provider or pharmacist if you have questions. COMMON BRAND NAME(S): Cytoxan, Neosar What should I tell my health care provider before I take this medicine? They need to know if you have any of these conditions: -blood disorders -history of other chemotherapy -infection -kidney disease -liver disease -recent or ongoing radiation therapy -tumors in the bone marrow -an unusual or allergic reaction to cyclophosphamide, other chemotherapy, other medicines, foods, dyes, or preservatives -pregnant or trying to get pregnant -breast-feeding How should I use this medicine? This drug is usually given as an injection into a vein or muscle or by infusion into a vein. It is administered in a hospital or clinic by a specially trained health care professional. Talk to your pediatrician regarding the use of this medicine in children. Special care may be needed. Overdosage: If you think you have taken too much of this medicine contact a poison control center or emergency room at once. NOTE: This medicine is only for you. Do not share this medicine with others. What if I miss a dose? It is important not to miss your dose. Call your doctor or health care professional if you are unable to keep an appointment. What may interact with this medicine? This medicine may interact with the following medications: -amiodarone -amphotericin B -azathioprine -certain antiviral medicines for HIV or AIDS such as protease inhibitors (e.g., indinavir, ritonavir) and zidovudine -certain blood pressure medications such as benazepril, captopril, enalapril, fosinopril, lisinopril, moexipril, monopril, perindopril, quinapril, ramipril, trandolapril -certain cancer medications such as anthracyclines (e.g.,  daunorubicin, doxorubicin), busulfan, cytarabine, paclitaxel, pentostatin, tamoxifen, trastuzumab -certain diuretics such as chlorothiazide, chlorthalidone, hydrochlorothiazide, indapamide, metolazone -certain medicines that treat or prevent blood clots like warfarin -certain muscle relaxants such as succinylcholine -cyclosporine -etanercept -indomethacin -medicines to increase blood counts like filgrastim, pegfilgrastim, sargramostim -medicines used as general anesthesia -metronidazole -natalizumab This list may not describe all possible interactions. Give your health care provider a list of all the medicines, herbs, non-prescription drugs, or dietary supplements you use. Also tell them if you smoke, drink alcohol, or use illegal drugs. Some items may interact with your medicine. What should I watch for while using this medicine? Visit your doctor for checks on your progress. This drug may make you feel generally unwell. This is not uncommon, as chemotherapy can affect healthy  cells as well as cancer cells. Report any side effects. Continue your course of treatment even though you feel ill unless your doctor tells you to stop. Drink water or other fluids as directed. Urinate often, even at night. In some cases, you may be given additional medicines to help with side effects. Follow all directions for their use. Call your doctor or health care professional for advice if you get a fever, chills or sore throat, or other symptoms of a cold or flu. Do not treat yourself. This drug decreases your body's ability to fight infections. Try to avoid being around people who are sick. This medicine may increase your risk to bruise or bleed. Call your doctor or health care professional if you notice any unusual bleeding. Be careful brushing and flossing your teeth or using a toothpick because you may get an infection or bleed more easily. If you have any dental work done, tell your dentist you are receiving  this medicine. You may get drowsy or dizzy. Do not drive, use machinery, or do anything that needs mental alertness until you know how this medicine affects you. Do not become pregnant while taking this medicine or for 1 year after stopping it. Women should inform their doctor if they wish to become pregnant or think they might be pregnant. Men should not father a child while taking this medicine and for 4 months after stopping it. There is a potential for serious side effects to an unborn child. Talk to your health care professional or pharmacist for more information. Do not breast-feed an infant while taking this medicine. This medicine may interfere with the ability to have a child. This medicine has caused ovarian failure in some women. This medicine has caused reduced sperm counts in some men. You should talk with your doctor or health care professional if you are concerned about your fertility. If you are going to have surgery, tell your doctor or health care professional that you have taken this medicine. What side effects may I notice from receiving this medicine? Side effects that you should report to your doctor or health care professional as soon as possible: -allergic reactions like skin rash, itching or hives, swelling of the face, lips, or tongue -low blood counts - this medicine may decrease the number of white blood cells, red blood cells and platelets. You may be at increased risk for infections and bleeding. -signs of infection - fever or chills, cough, sore throat, pain or difficulty passing urine -signs of decreased platelets or bleeding - bruising, pinpoint red spots on the skin, black, tarry stools, blood in the urine -signs of decreased red blood cells - unusually weak or tired, fainting spells, lightheadedness -breathing problems -dark urine -dizziness -palpitations -swelling of the ankles, feet, hands -trouble passing urine or change in the amount of urine -weight  gain -yellowing of the eyes or skin Side effects that usually do not require medical attention (report to your doctor or health care professional if they continue or are bothersome): -changes in nail or skin color -hair loss -missed menstrual periods -mouth sores -nausea, vomiting This list may not describe all possible side effects. Call your doctor for medical advice about side effects. You may report side effects to FDA at 1-800-FDA-1088. Where should I keep my medicine? This drug is given in a hospital or clinic and will not be stored at home. NOTE: This sheet is a summary. It may not cover all possible information. If you have questions about this medicine,  talk to your doctor, pharmacist, or health care provider.  2015, Elsevier/[employer] Standard. (2012-05-25 16:22:58) Methotrexate injection What is this medicine? METHOTREXATE (METH oh TREX ate) is a chemotherapy drug. This medicine affects cells that are rapidly growing, such as cancer cells and cells in your mouth and stomach. It is used to treat many cancers and other medical conditions. It is used for leukemias, lymphomas, breast cancer, lung cancer, head and neck cancers, and other cancers. This medicine also works on the immune system and is commonly used to treat psoriasis and rheumatoid arthritis. This medicine may be used for other purposes; ask your health care provider or pharmacist if you have questions. What should I tell my health care provider before I take this medicine? They need to know if you have any of these conditions: -if you frequently drink alcohol containing drinks -infection (especially a virus infection such as chickenpox, cold sores, or herpes) -immune system problems -kidney disease -liver disease -low blood counts, like platelets, red bloods, or white blood cells -lung disease -recent or ongoing radiation therapy -an unusual or allergic reaction to methotrexate, benzyl alcohol, other medicines, foods,  dyes, or preservatives -pregnant or trying to get pregnant -breast-feeding How should I use this medicine? This drug is given as an injection into a muscle or into a vein. It may also be given into the spinal fluid. It is administered in a hospital or clinic by a specially trained health care professional. Talk to your pediatrician regarding the use of this medicine in children. While this drug may be prescribed for selected conditions, precautions do apply. Overdosage: If you think you have taken too much of this medicine contact a poison control center or emergency room at once. NOTE: This medicine is only for you. Do not share this medicine with others. What if I miss a dose? It is important not to miss your dose. Call your doctor or health care professional if you are unable to keep an appointment. What may interact with this medicine? -antibiotics and other medicines for infections -aspirin and aspirin-like medicines including bismuth subsalicylate (Pepto-Bismol) -cisplatin -dapsone -folic acid in supplements or vitamins -mercaptopurine -NSAIDs, medicines for pain and inflammation, like ibuprofen or naproxen -pemetrexed -phenylbutazone -phenytoin -probenecid -pyrimethamine -theophylline -trimetrexate -vaccines This list may not describe all possible interactions. Give your health care provider a list of all the medicines, herbs, non-prescription drugs, or dietary supplements you use. Also tell them if you smoke, drink alcohol, or use illegal drugs. Some items may interact with your medicine. What should I watch for while using this medicine? Visit your doctor for checks on your progress. You will need to have regular blood checks during your treatment to monitor your blood, liver function, and kidney function. This drug may make you feel generally unwell. This is not uncommon, as chemotherapy can affect healthy cells as well as cancer cells. Report any side effects. Continue your  course of treatment even though you feel ill unless your doctor tells you to stop. In some cases, you may be given additional medicines to help with side effects. Follow all directions for their use. Call your doctor or health care professional for advice if you get a fever, chills or sore throat, or other symptoms of a cold or flu. Do not treat yourself. This drug decreases your body's ability to fight infections. Try to avoid being around people who are sick. This medicine may increase your risk to bruise or bleed. Call your doctor or health care professional if you  notice any unusual bleeding. Be careful brushing and flossing your teeth or using a toothpick because you may get an infection or bleed more easily. If you have any dental work done, tell your dentist you are receiving this medicine. Avoid taking products that contain aspirin, acetaminophen, ibuprofen, naproxen, or ketoprofen unless instructed by your doctor. These medicines may hide a fever. This medicine can make you more sensitive to the sun. Keep out of the sun. If you cannot avoid being in the sun, wear protective clothing and use sunscreen. Do not use sun lamps or tanning beds/booths. Do not treat diarrhea with over the counter products. Contact your doctor if you have diarrhea. To protect your kidneys, drink water or other fluids as directed while you are taking this medicine. Do not drink alcohol-containing drinks while taking this medicine. Both alcohol and the medicine may cause damage to your liver. Men and women must use effective birth control while they are taking this medicine. Do not become pregnant while taking this medicine. Women must continue using effective birth control for 1 full menstrual cycle after stopping this medicine. Tell your doctor right away if you think that you or your partner might be pregnant. There is a potential for serious side effects to an unborn child. Talk to your health care professional or  pharmacist for more information. Do not breast-feed an infant while taking this medicine. Men must continue effective birth control for 3 months after stopping this medicine. What side effects may I notice from receiving this medicine? Side effects that you should report to your doctor or health care professional as soon as possible: -allergic reactions like skin rash, itching or hives, swelling of the face, lips, or tongue -low blood counts - this medicine may decrease the number of white blood cells, red blood cells and platelets. You may be at increased risk for infections and bleeding. -signs of infection - fever or chills, cough, sore throat, pain or difficulty passing urine -signs of decreased platelets or bleeding - bruising, pinpoint red spots on the skin, black, tarry stools, blood in the urine -signs of decreased red blood cells - unusually weak or tired, fainting spells, lightheadedness -breathing problems, like a dry cough -changes in vision -confusion, not alert -diarrhea -mouth or throat sores or ulcers -problems with balance, talking, walking -redness, blistering, peeling or loosening of the skin, including inside the mouth -seizures -trouble passing urine or change in the amount of urine -vomiting -yellowing of the eyes or skin Side effects that usually do not require medical attention (report to your doctor or health care professional if they continue or are bothersome): -change in skin color -eye irritation -hair loss -headache -loss of appetite -nausea -stomach upset This list may not describe all possible side effects. Call your doctor for medical advice about side effects. You may report side effects to FDA at 1-800-FDA-1088. Where should I keep my medicine? This drug is given in a hospital or clinic and will not be stored at home. NOTE: This sheet is a summary. It may not cover all possible information. If you have questions about this medicine, talk to your  doctor, pharmacist, or health care provider.  2015, [employer] Standard. (2008-01-17 11:13:24) Fluorouracil, 5-FU injection What is this medicine? FLUOROURACIL, 5-FU (flure oh YOOR a sil) is a chemotherapy drug. It slows the growth of cancer cells. This medicine is used to treat many types of cancer like breast cancer, colon or rectal cancer, pancreatic cancer, and stomach cancer. This medicine may  be used for other purposes; ask your health care provider or pharmacist if you have questions. COMMON BRAND NAME(S): Adrucil What should I tell my health care provider before I take this medicine? They need to know if you have any of these conditions: -blood disorders -dihydropyrimidine dehydrogenase (DPD) deficiency -infection (especially a virus infection such as chickenpox, cold sores, or herpes) -kidney disease -liver disease -malnourished, poor nutrition -recent or ongoing radiation therapy -an unusual or allergic reaction to fluorouracil, other chemotherapy, other medicines, foods, dyes, or preservatives -pregnant or trying to get pregnant -breast-feeding How should I use this medicine? This drug is given as an infusion or injection into a vein. It is administered in a hospital or clinic by a specially trained health care professional. Talk to your pediatrician regarding the use of this medicine in children. Special care may be needed. Overdosage: If you think you have taken too much of this medicine contact a poison control center or emergency room at once. NOTE: This medicine is only for you. Do not share this medicine with others. What if I miss a dose? It is important not to miss your dose. Call your doctor or health care professional if you are unable to keep an appointment. What may interact with this medicine? -allopurinol -cimetidine -dapsone -digoxin -hydroxyurea -leucovorin -levamisole -medicines for seizures like ethotoin, fosphenytoin, phenytoin -medicines to  increase blood counts like filgrastim, pegfilgrastim, sargramostim -medicines that treat or prevent blood clots like warfarin, enoxaparin, and dalteparin -methotrexate -metronidazole -pyrimethamine -some other chemotherapy drugs like busulfan, cisplatin, estramustine, vinblastine -trimethoprim -trimetrexate -vaccines Talk to your doctor or health care professional before taking any of these medicines: -acetaminophen -aspirin -ibuprofen -ketoprofen -naproxen This list may not describe all possible interactions. Give your health care provider a list of all the medicines, herbs, non-prescription drugs, or dietary supplements you use. Also tell them if you smoke, drink alcohol, or use illegal drugs. Some items may interact with your medicine. What should I watch for while using this medicine? Visit your doctor for checks on your progress. This drug may make you feel generally unwell. This is not uncommon, as chemotherapy can affect healthy cells as well as cancer cells. Report any side effects. Continue your course of treatment even though you feel ill unless your doctor tells you to stop. In some cases, you may be given additional medicines to help with side effects. Follow all directions for their use. Call your doctor or health care professional for advice if you get a fever, chills or sore throat, or other symptoms of a cold or flu. Do not treat yourself. This drug decreases your body's ability to fight infections. Try to avoid being around people who are sick. This medicine may increase your risk to bruise or bleed. Call your doctor or health care professional if you notice any unusual bleeding. Be careful brushing and flossing your teeth or using a toothpick because you may get an infection or bleed more easily. If you have any dental work done, tell your dentist you are receiving this medicine. Avoid taking products that contain aspirin, acetaminophen, ibuprofen, naproxen, or ketoprofen  unless instructed by your doctor. These medicines may hide a fever. Do not become pregnant while taking this medicine. Women should inform their doctor if they wish to become pregnant or think they might be pregnant. There is a potential for serious side effects to an unborn child. Talk to your health care professional or pharmacist for more information. Do not breast-feed an infant while taking this  medicine. Men should inform their doctor if they wish to father a child. This medicine may lower sperm counts. Do not treat diarrhea with over the counter products. Contact your doctor if you have diarrhea that lasts more than 2 days or if it is severe and watery. This medicine can make you more sensitive to the sun. Keep out of the sun. If you cannot avoid being in the sun, wear protective clothing and use sunscreen. Do not use sun lamps or tanning beds/booths. What side effects may I notice from receiving this medicine? Side effects that you should report to your doctor or health care professional as soon as possible: -allergic reactions like skin rash, itching or hives, swelling of the face, lips, or tongue -low blood counts - this medicine may decrease the number of white blood cells, red blood cells and platelets. You may be at increased risk for infections and bleeding. -signs of infection - fever or chills, cough, sore throat, pain or difficulty passing urine -signs of decreased platelets or bleeding - bruising, pinpoint red spots on the skin, black, tarry stools, blood in the urine -signs of decreased red blood cells - unusually weak or tired, fainting spells, lightheadedness -breathing problems -changes in vision -chest pain -mouth sores -nausea and vomiting -pain, swelling, redness at site where injected -pain, tingling, numbness in the hands or feet -redness, swelling, or sores on hands or feet -stomach pain -unusual bleeding Side effects that usually do not require medical attention  (report to your doctor or health care professional if they continue or are bothersome): -changes in finger or toe nails -diarrhea -dry or itchy skin -hair loss -headache -loss of appetite -sensitivity of eyes to the light -stomach upset -unusually teary eyes This list may not describe all possible side effects. Call your doctor for medical advice about side effects. You may report side effects to FDA at 1-800-FDA-1088. Where should I keep my medicine? This drug is given in a hospital or clinic and will not be stored at home. NOTE: This sheet is a summary. It may not cover all possible information. If you have questions about this medicine, talk to your doctor, pharmacist, or health care provider.  2015, [employer] Standard. (2007-11-14 13:53:16) Pegfilgrastim injection What is this medicine? PEGFILGRASTIM (peg fil GRA stim) is a long-acting granulocyte colony-stimulating factor that stimulates the growth of neutrophils, a type of white blood cell important in the body's fight against infection. It is used to reduce the incidence of fever and infection in patients with certain types of cancer who are receiving chemotherapy that affects the bone marrow. This medicine may be used for other purposes; ask your health care provider or pharmacist if you have questions. COMMON BRAND NAME(S): Neulasta What should I tell my health care provider before I take this medicine? They need to know if you have any of these conditions: -latex allergy -ongoing radiation therapy -sickle cell disease -skin reactions to acrylic adhesives (On-Body Injector only) -an unusual or allergic reaction to pegfilgrastim, filgrastim, other medicines, foods, dyes, or preservatives -pregnant or trying to get pregnant -breast-feeding How should I use this medicine? This medicine is for injection under the skin. If you get this medicine at home, you will be taught how to prepare and give the pre-filled syringe or how to  use the On-body Injector. Refer to the patient Instructions for Use for detailed instructions. Use exactly as directed. Take your medicine at regular intervals. Do not take your medicine more often than directed. It is  important that you put your used needles and syringes in a special sharps container. Do not put them in a trash can. If you do not have a sharps container, call your pharmacist or healthcare provider to get one. Talk to your pediatrician regarding the use of this medicine in children. Special care may be needed. Overdosage: If you think you have taken too much of this medicine contact a poison control center or emergency room at once. NOTE: This medicine is only for you. Do not share this medicine with others. What if I miss a dose? It is important not to miss your dose. Call your doctor or health care professional if you miss your dose. If you miss a dose due to an On-body Injector failure or leakage, a new dose should be administered as soon as possible using a single prefilled syringe for manual use. What may interact with this medicine? Interactions have not been studied. Give your health care provider a list of all the medicines, herbs, non-prescription drugs, or dietary supplements you use. Also tell them if you smoke, drink alcohol, or use illegal drugs. Some items may interact with your medicine. This list may not describe all possible interactions. Give your health care provider a list of all the medicines, herbs, non-prescription drugs, or dietary supplements you use. Also tell them if you smoke, drink alcohol, or use illegal drugs. Some items may interact with your medicine. What should I watch for while using this medicine? You may need blood work done while you are taking this medicine. If you are going to need a MRI, CT scan, or other procedure, tell your doctor that you are using this medicine (On-Body Injector only). What side effects may I notice from receiving this  medicine? Side effects that you should report to your doctor or health care professional as soon as possible: -allergic reactions like skin rash, itching or hives, swelling of the face, lips, or tongue -dizziness -fever -pain, redness, or irritation at site where injected -pinpoint red spots on the skin -shortness of breath or breathing problems -stomach or side pain, or pain at the shoulder -swelling -tiredness -trouble passing urine Side effects that usually do not require medical attention (report to your doctor or health care professional if they continue or are bothersome): -bone pain -muscle pain This list may not describe all possible side effects. Call your doctor for medical advice about side effects. You may report side effects to FDA at 1-800-FDA-1088. Where should I keep my medicine? Keep out of the reach of children. Store pre-filled syringes in a refrigerator between 2 and 8 degrees C (36 and 46 degrees F). Do not freeze. Keep in carton to protect from light. Throw away this medicine if it is left out of the refrigerator for more than 48 hours. Throw away any unused medicine after the expiration date. NOTE: This sheet is a summary. It may not cover all possible information. If you have questions about this medicine, talk to your doctor, pharmacist, or health care provider.  2015, [employer] Standard. (2013-10-10 16:14:05) Palonosetron Injection What is this medicine? PALONOSETRON (pal oh NOE se tron) is used to prevent nausea and vomiting caused by chemotherapy. It also helps prevent delayed nausea and vomiting that may occur a few days after your treatment. This medicine may be used for other purposes; ask your health care provider or pharmacist if you have questions. COMMON BRAND NAME(S): Aloxi What should I tell my health care provider before I take this medicine? They  need to know if you have any of these conditions: -an unusual or allergic reaction to palonosetron,  dolasetron, granisetron, ondansetron, other medicines, foods, dyes, or preservatives -pregnant or trying to get pregnant -breast-feeding How should I use this medicine? This medicine is for infusion into a vein. It is given by a health care professional in a hospital or clinic setting. Talk to your pediatrician regarding the use of this medicine in children. While this drug may be prescribed for children as young as 1 month for selected conditions, precautions do apply. Overdosage: If you think you have taken too much of this medicine contact a poison control center or emergency room at once. NOTE: This medicine is only for you. Do not share this medicine with others. What if I miss a dose? This does not apply. What may interact with this medicine? -certain medicines for depression, anxiety, or psychotic disturbances -fentanyl -linezolid -MAOIs like Carbex, Eldepryl, Marplan, Nardil, and Parnate -methylene blue (injected into a vein) -tramadol This list may not describe all possible interactions. Give your health care provider a list of all the medicines, herbs, non-prescription drugs, or dietary supplements you use. Also tell them if you smoke, drink alcohol, or use illegal drugs. Some items may interact with your medicine. What should I watch for while using this medicine? Your condition will be monitored carefully while you are receiving this medicine. What side effects may I notice from receiving this medicine? Side effects that you should report to your doctor or health care professional as soon as possible: -allergic reactions like skin rash, itching or hives, swelling of the face, lips, or tongue -breathing problems -confusion -dizziness -fast, irregular heartbeat -fever and chills -loss of balance or coordination -seizures -sweating -swelling of the hands and feet -tremors -unusually weak or tired Side effects that usually do not require medical attention (report to your  doctor or health care professional if they continue or are bothersome): -constipation or diarrhea -headache This list may not describe all possible side effects. Call your doctor for medical advice about side effects. You may report side effects to FDA at 1-800-FDA-1088. Where should I keep my medicine? This drug is given in a hospital or clinic and will not be stored at home. NOTE: This sheet is a summary. It may not cover all possible information. If you have questions about this medicine, talk to your doctor, pharmacist, or health care provider.  2015, [employer] Standard. (2013-05-17 10:38:36) Fosaprepitant injection What is this medicine? FOSAPREPITANT (fos ap RE pi tant) is used together with other medicines to prevent nausea and vomiting caused by cancer treatment (chemotherapy). This medicine may be used for other purposes; ask your health care provider or pharmacist if you have questions. COMMON BRAND NAME(S): Emend What should I tell my health care provider before I take this medicine? They need to know if you have any of these conditions: -liver disease -an unusual or allergic reaction to fosaprepitant, aprepitant, medicines, foods, dyes, or preservatives -pregnant or trying to get pregnant -breast-feeding How should I use this medicine? This medicine is for injection into a vein. It is given by a health care professional in a hospital or clinic setting. Talk to your pediatrician regarding the use of this medicine in children. Special care may be needed. Overdosage: If you think you have taken too much of this medicine contact a poison control center or emergency room at once. NOTE: This medicine is only for you. Do not share this medicine with others. What if I  miss a dose? This does not apply. What may interact with this medicine? Do not take this medicine with any of these medicines: -cisapride -pimozide -ranolazine This medicine may also interact with the following  medications: -diltiazem -female hormones, like estrogens or progestins and birth control pills -medicines for fungal infections like ketoconazole and itraconazole -medicines for HIV -medicines for seizures or to control epilepsy like carbamazepine or phenytoin -medicines used for sleep or anxiety disorders like alprazolam, diazepam, or midazolam -nefazodone -paroxetine -rifampin -some chemotherapy medications like etoposide, ifosfamide, vinblastine, vincristine -some antibiotics like clarithromycin, erythromycin, troleandomycin -steroid medicines like dexamethasone or methylprednisolone -tolbutamide -warfarin This list may not describe all possible interactions. Give your health care provider a list of all the medicines, herbs, non-prescription drugs, or dietary supplements you use. Also tell them if you smoke, drink alcohol, or use illegal drugs. Some items may interact with your medicine. What should I watch for while using this medicine? Do not take this medicine if you already have nausea and vomiting. Ask your health care provider what to do if you already have nausea. Birth control pills may not work properly while you are taking this medicine. Talk to your doctor about using an extra method of birth control. This medicine should not be used continuously for a long time. Visit your doctor or health care professional for regular check-ups. This medicine may change your liver function blood test results. What side effects may I notice from receiving this medicine? Side effects that you should report to your doctor or health care professional as soon as possible: -allergic reactions like skin rash, itching or hives, swelling of the face, lips, or tongue -breathing problems -changes in heart rhythm -high or low blood pressure -pain, redness, or irritation at site where injected -rectal bleeding -serious dizziness or disorientation, confusion -sharp or severe stomach pain -sharp pain  in your leg Side effects that usually do not require medical attention (report to your doctor or health care professional if they continue or are bothersome): -constipation or diarrhea -hair loss -headache -hiccups -loss of appetite -nausea -upset stomach -tiredness This list may not describe all possible side effects. Call your doctor for medical advice about side effects. You may report side effects to FDA at 1-800-FDA-1088. Where should I keep my medicine? This drug is given in a hospital or clinic and will not be stored at home. NOTE: This sheet is a summary. It may not cover all possible information. If you have questions about this medicine, talk to your doctor, pharmacist, or health care provider.  2015, [employer] Standard. (2009-06-23 12:46:13) Dexamethasone injection What is this medicine? DEXAMETHASONE (dex a METH a sone) is a corticosteroid. It is used to treat inflammation of the skin, joints, lungs, and other organs. Common conditions treated include asthma, allergies, and arthritis. It is also used for other conditions, like blood disorders and diseases of the adrenal glands. This medicine may be used for other purposes; ask your health care provider or pharmacist if you have questions. COMMON BRAND NAME(S): Decadron, Solurex What should I tell my health care provider before I take this medicine? They need to know if you have any of these conditions: -blood clotting problems -Cushing's syndrome -diabetes -glaucoma -heart problems or disease -high blood pressure -infection like herpes, measles, tuberculosis, or chickenpox -kidney disease -liver disease -mental problems -myasthenia gravis -osteoporosis -previous heart attack -seizures -stomach, ulcer or intestine disease including colitis and diverticulitis -thyroid problem -an unusual or allergic reaction to dexamethasone, corticosteroids, other medicines, lactose, foods,  dyes, or preservatives -pregnant or  trying to get pregnant -breast-feeding How should I use this medicine? This medicine is for injection into a muscle, joint, lesion, soft tissue, or vein. It is given by a health care professional in a hospital or clinic setting. Talk to your pediatrician regarding the use of this medicine in children. Special care may be needed. Overdosage: If you think you have taken too much of this medicine contact a poison control center or emergency room at once. NOTE: This medicine is only for you. Do not share this medicine with others. What if I miss a dose? This may not apply. If you are having a series of injections over a prolonged period, try not to miss an appointment. Call your doctor or health care professional to reschedule if you are unable to keep an appointment. What may interact with this medicine? Do not take this medicine with any of the following medications: -mifepristone, RU-486 -vaccines This medicine may also interact with the following medications: -amphotericin B -antibiotics like clarithromycin, erythromycin, and troleandomycin -aspirin and aspirin-like drugs -barbiturates like phenobarbital -carbamazepine -cholestyramine -cholinesterase inhibitors like donepezil, galantamine, rivastigmine, and tacrine -cyclosporine -digoxin -diuretics -ephedrine -female hormones, like estrogens or progestins and birth control pills -indinavir -isoniazid -ketoconazole -medicines for diabetes -medicines that improve muscle tone or strength for conditions like myasthenia gravis -NSAIDs, medicines for pain and inflammation, like ibuprofen or naproxen -phenytoin -rifampin -thalidomide -warfarin This list may not describe all possible interactions. Give your health care provider a list of all the medicines, herbs, non-prescription drugs, or dietary supplements you use. Also tell them if you smoke, drink alcohol, or use illegal drugs. Some items may interact with your medicine. What should  I watch for while using this medicine? Your condition will be monitored carefully while you are receiving this medicine. If you are taking this medicine for a long time, carry an identification card with your name and address, the type and dose of your medicine, and your doctor's name and address. This medicine may increase your risk of getting an infection. Stay away from people who are sick. Tell your doctor or health care professional if you are around anyone with measles or chickenpox. Talk to your health care provider before you get any vaccines that you take this medicine. If you are going to have surgery, tell your doctor or health care professional that you have taken this medicine within the last twelve months. Ask your doctor or health care professional about your diet. You may need to lower the amount of salt you eat. The medicine can increase your blood sugar. If you are a diabetic check with your doctor if you need help adjusting the dose of your diabetic medicine. What side effects may I notice from receiving this medicine? Side effects that you should report to your doctor or health care professional as soon as possible: -allergic reactions like skin rash, itching or hives, swelling of the face, lips, or tongue -black or tarry stools -change in the amount of urine -changes in vision -confusion, excitement, restlessness, a false sense of well-being -fever, sore throat, sneezing, cough, or other signs of infection, wounds that will not heal -hallucinations -increased thirst -mental depression, mood swings, mistaken feelings of self importance or of being mistreated -pain in hips, back, ribs, arms, shoulders, or legs -pain, redness, or irritation at the injection site -redness, blistering, peeling or loosening of the skin, including inside the mouth -rounding out of face -swelling of feet or lower legs -unusual bleeding  or bruising -unusual tired or weak -wounds that do not  heal Side effects that usually do not require medical attention (report to your doctor or health care professional if they continue or are bothersome): -diarrhea or constipation -change in taste -headache -nausea, vomiting -skin problems, acne, thin and shiny skin -touble sleeping -unusual growth of hair on the face or body -weight gain This list may not describe all possible side effects. Call your doctor for medical advice about side effects. You may report side effects to FDA at 1-800-FDA-1088. Where should I keep my medicine? This drug is given in a hospital or clinic and will not be stored at home. NOTE: This sheet is a summary. It may not cover all possible information. If you have questions about this medicine, talk to your doctor, pharmacist, or health care provider.  2015, [employer] Standard. (2007-11-01 14:04:12) Lidocaine; Prilocaine cream What is this medicine? LIDOCAINE; PRILOCAINE (LYE doe kane; PRIL oh kane) is a topical anesthetic that causes loss of feeling in the skin and surrounding tissues. It is used to numb the skin before procedures or injections. This medicine may be used for other purposes; ask your health care provider or pharmacist if you have questions. COMMON BRAND NAME(S): EMLA What should I tell my health care provider before I take this medicine? They need to know if you have any of these conditions: -glucose-6-phosphate deficiencies -heart disease -kidney or liver disease -methemoglobinemia -an unusual or allergic reaction to lidocaine, prilocaine, other medicines, foods, dyes, or preservatives -pregnant or trying to get pregnant -breast-feeding How should I use this medicine? This medicine is for external use only on the skin. Do not take by mouth. Follow the directions on the prescription label. Wash hands before and after use. Do not use more or leave in contact with the skin longer than directed. Do not apply to eyes or open wounds. It can cause  irritation and blurred or temporary loss of vision. If this medicine comes in contact with your eyes, immediately rinse the eye with water. Do not touch or rub the eye. Contact your health care provider right away. Talk to your pediatrician regarding the use of this medicine in children. While this medicine may be prescribed for children for selected conditions, precautions do apply. Overdosage: If you think you have taken too much of this medicine contact a poison control center or emergency room at once. NOTE: This medicine is only for you. Do not share this medicine with others. What if I miss a dose? This medicine is usually only applied once prior to each procedure. It must be in contact with the skin for a period of time for it to work. If you applied this medicine later than directed, tell your health care professional before starting the procedure. What may interact with this medicine? -acetaminophen -chloroquine -dapsone -medicines to control heart rhythm -nitrates like nitroglycerin and nitroprusside -other ointments, creams, or sprays that may contain anesthetic medicine -phenobarbital -phenytoin -quinine -sulfonamides like sulfacetamide, sulfamethoxazole, sulfasalazine and others This list may not describe all possible interactions. Give your health care provider a list of all the medicines, herbs, non-prescription drugs, or dietary supplements you use. Also tell them if you smoke, drink alcohol, or use illegal drugs. Some items may interact with your medicine. What should I watch for while using this medicine? Be careful to avoid injury to the treated area while it is numb and you are not aware of pain. Avoid scratching, rubbing, or exposing the treated area to hot  or cold temperatures until complete sensation has returned. The numb feeling will wear off a few hours after applying the cream. What side effects may I notice from receiving this medicine? Side effects that you should  report to your doctor or health care professional as soon as possible: -blurred vision -chest pain -difficulty breathing -dizziness -drowsiness -fast or irregular heartbeat -skin rash or itching -swelling of your throat, lips, or face -trembling Side effects that usually do not require medical attention (report to your doctor or health care professional if they continue or are bothersome): -changes in ability to feel hot or cold -redness and swelling at the application site This list may not describe all possible side effects. Call your doctor for medical advice about side effects. You may report side effects to FDA at 1-800-FDA-1088. Where should I keep my medicine? Keep out of reach of children. Store at room temperature between 15 and 30 degrees C (59 and 86 degrees F). Keep container tightly closed. Throw away any unused medicine after the expiration date. NOTE: This sheet is a summary. It may not cover all possible information. If you have questions about this medicine, talk to your doctor, pharmacist, or health care provider.  2015, Elsevier/Gold Standard. (2008-01-14 17:14:35) Ondansetron tablets What is this medicine? ONDANSETRON (on DAN se tron) is used to treat nausea and vomiting caused by chemotherapy. It is also used to prevent or treat nausea and vomiting after surgery. This medicine may be used for other purposes; ask your health care provider or pharmacist if you have questions. COMMON BRAND NAME(S): Zofran What should I tell my health care provider before I take this medicine? They need to know if you have any of these conditions: -heart disease -history of irregular heartbeat -liver disease -low levels of magnesium or potassium in the blood -an unusual or allergic reaction to ondansetron, granisetron, other medicines, foods, dyes, or preservatives -pregnant or trying to get pregnant -breast-feeding How should I use this medicine? Take this medicine by mouth with a  glass of water. Follow the directions on your prescription label. Take your doses at regular intervals. Do not take your medicine more often than directed. Talk to your pediatrician regarding the use of this medicine in children. Special care may be needed. Overdosage: If you think you have taken too much of this medicine contact a poison control center or emergency room at once. NOTE: This medicine is only for you. Do not share this medicine with others. What if I miss a dose? If you miss a dose, take it as soon as you can. If it is almost time for your next dose, take only that dose. Do not take double or extra doses. What may interact with this medicine? Do not take this medicine with any of the following medications: -apomorphine -certain medicines for fungal infections like fluconazole, itraconazole, ketoconazole, posaconazole, voriconazole -cisapride -dofetilide -dronedarone -pimozide -thioridazine -ziprasidone This medicine may also interact with the following medications: -carbamazepine -certain medicines for depression, anxiety, or psychotic disturbances -fentanyl -linezolid -MAOIs like Carbex, Eldepryl, Marplan, Nardil, and Parnate -methylene blue (injected into a vein) -other medicines that prolong the QT interval (cause an abnormal heart rhythm) -phenytoin -rifampicin -tramadol This list may not describe all possible interactions. Give your health care provider a list of all the medicines, herbs, non-prescription drugs, or dietary supplements you use. Also tell them if you smoke, drink alcohol, or use illegal drugs. Some items may interact with your medicine. What should I watch for while using this  medicine? Check with your doctor or health care professional right away if you have any sign of an allergic reaction. What side effects may I notice from receiving this medicine? Side effects that you should report to your doctor or health care professional as soon as  possible: -allergic reactions like skin rash, itching or hives, swelling of the face, lips or tongue -breathing problems -confusion -dizziness -fast or irregular heartbeat -feeling faint or lightheaded, falls -fever and chills -loss of balance or coordination -seizures -sweating -swelling of the hands or feet -tightness in the chest -tremors -unusually weak or tired Side effects that usually do not require medical attention (report to your doctor or health care professional if they continue or are bothersome): -constipation or diarrhea -headache This list may not describe all possible side effects. Call your doctor for medical advice about side effects. You may report side effects to FDA at 1-800-FDA-1088. Where should I keep my medicine? Keep out of the reach of children. Store between 2 and 30 degrees C (36 and 86 degrees F). Throw away any unused medicine after the expiration date. NOTE: This sheet is a summary. It may not cover all possible information. If you have questions about this medicine, talk to your doctor, pharmacist, or health care provider.  2015, Elsevier/Gold Standard. (2013-04-17 16:27:45) Prochlorperazine tablets What is this medicine? PROCHLORPERAZINE (proe klor PER a zeen) helps to control severe nausea and vomiting. This medicine is also used to treat schizophrenia. It can also help patients who experience anxiety that is not due to psychological illness. This medicine may be used for other purposes; ask your health care provider or pharmacist if you have questions. COMMON BRAND NAME(S): Compazine What should I tell my health care provider before I take this medicine? They need to know if you have any of these conditions: -blood disorders or disease -dementia -liver disease or jaundice -Parkinson's disease -uncontrollable movement disorder -an unusual or allergic reaction to prochlorperazine, other medicines, foods, dyes, or preservatives -pregnant or  trying to get pregnant -breast-feeding How should I use this medicine? Take this medicine by mouth with a glass of water. Follow the directions on the prescription label. Take your doses at regular intervals. Do not take your medicine more often than directed. Do not stop taking this medicine suddenly. This can cause nausea, vomiting, and dizziness. Ask your doctor or health care professional for advice. Talk to your pediatrician regarding the use of this medicine in children. Special care may be needed. While this drug may be prescribed for children as young as 2 years for selected conditions, precautions do apply. Overdosage: If you think you have taken too much of this medicine contact a poison control center or emergency room at once. NOTE: This medicine is only for you. Do not share this medicine with others. What if I miss a dose? If you miss a dose, take it as soon as you can. If it is almost time for your next dose, take only that dose. Do not take double or extra doses. What may interact with this medicine? Do not take this medicine with any of the following medications: -amoxapine -antidepressants like citalopram, escitalopram, fluoxetine, paroxetine, and sertraline -deferoxamine -dofetilide -maprotiline -tricyclic antidepressants like amitriptyline, clomipramine, imipramine, nortiptyline and others This medicine may also interact with the following medications: -lithium -medicines for pain -phenytoin -propranolol -warfarin This list may not describe all possible interactions. Give your health care provider a list of all the medicines, herbs, non-prescription drugs, or dietary supplements you use.  Also tell them if you smoke, drink alcohol, or use illegal drugs. Some items may interact with your medicine. What should I watch for while using this medicine? Visit your doctor or health care professional for regular checks on your progress. You may get drowsy or dizzy. Do not drive,  use machinery, or do anything that needs mental alertness until you know how this medicine affects you. Do not stand or sit up quickly, especially if you are an older patient. This reduces the risk of dizzy or fainting spells. Alcohol may interfere with the effect of this medicine. Avoid alcoholic drinks. This medicine can reduce the response of your body to heat or cold. Dress warm in cold weather and stay hydrated in hot weather. If possible, avoid extreme temperatures like saunas, hot tubs, very hot or cold showers, or activities that can cause dehydration such as vigorous exercise. This medicine can make you more sensitive to the sun. Keep out of the sun. If you cannot avoid being in the sun, wear protective clothing and use sunscreen. Do not use sun lamps or tanning beds/booths. Your mouth may get dry. Chewing sugarless gum or sucking hard candy, and drinking plenty of water may help. Contact your doctor if the problem does not go away or is severe. What side effects may I notice from receiving this medicine? Side effects that you should report to your doctor or health care professional as soon as possible: -blurred vision -breast enlargement in men or women -breast milk in women who are not breast-feeding -chest pain, fast or irregular heartbeat -confusion, restlessness -dark yellow or brown urine -difficulty breathing or swallowing -dizziness or fainting spells -drooling, shaking, movement difficulty (shuffling walk) or rigidity -fever, chills, sore throat -involuntary or uncontrollable movements of the eyes, mouth, head, arms, and legs -seizures -stomach area pain -unusually weak or tired -unusual bleeding or bruising -yellowing of skin or eyes Side effects that usually do not require medical attention (report to your doctor or health care professional if they continue or are bothersome): -difficulty passing urine -difficulty sleeping -headache -sexual dysfunction -skin rash, or  itching This list may not describe all possible side effects. Call your doctor for medical advice about side effects. You may report side effects to FDA at 1-800-FDA-1088. Where should I keep my medicine? Keep out of the reach of children. Store at room temperature between 15 and 30 degrees C (59 and 86 degrees F). Protect from light. Throw away any unused medicine after the expiration date. NOTE: This sheet is a summary. It may not cover all possible information. If you have questions about this medicine, talk to your doctor, pharmacist, or health care provider.  2015, Elsevier/Gold Standard. (2011-11-29 16:59:39)

## 2014-09-30 ENCOUNTER — Encounter (HOSPITAL_BASED_OUTPATIENT_CLINIC_OR_DEPARTMENT_OTHER): Payer: 59

## 2014-09-30 DIAGNOSIS — C50412 Malignant neoplasm of upper-outer quadrant of left female breast: Secondary | ICD-10-CM | POA: Diagnosis not present

## 2014-09-30 NOTE — Progress Notes (Signed)
Pt very emotional and tearful today regarding tx. She is very nervous about starting. Consent not obtained today. Will obtain on Thursday when pt starts chemo. Calendar given to patient. Distress screening to be done on Thursday as well.

## 2014-09-30 NOTE — Addendum Note (Signed)
Addended by: Gerhard Perches on: 09/30/2014 04:39 PM   Modules accepted: Orders, Medications

## 2014-10-01 ENCOUNTER — Other Ambulatory Visit (HOSPITAL_COMMUNITY): Payer: Self-pay | Admitting: Oncology

## 2014-10-02 ENCOUNTER — Encounter (HOSPITAL_BASED_OUTPATIENT_CLINIC_OR_DEPARTMENT_OTHER): Payer: 59

## 2014-10-02 ENCOUNTER — Encounter (HOSPITAL_COMMUNITY): Payer: Self-pay

## 2014-10-02 DIAGNOSIS — Z5111 Encounter for antineoplastic chemotherapy: Secondary | ICD-10-CM | POA: Diagnosis not present

## 2014-10-02 DIAGNOSIS — E876 Hypokalemia: Secondary | ICD-10-CM | POA: Diagnosis not present

## 2014-10-02 DIAGNOSIS — R319 Hematuria, unspecified: Secondary | ICD-10-CM | POA: Diagnosis present

## 2014-10-02 DIAGNOSIS — C50412 Malignant neoplasm of upper-outer quadrant of left female breast: Secondary | ICD-10-CM

## 2014-10-02 LAB — COMPREHENSIVE METABOLIC PANEL
ALT: 14 U/L (ref 0–35)
AST: 14 U/L (ref 0–37)
Albumin: 3.8 g/dL (ref 3.5–5.2)
Alkaline Phosphatase: 64 U/L (ref 39–117)
Anion gap: 6 (ref 5–15)
BUN: 14 mg/dL (ref 6–23)
CO2: 29 mmol/L (ref 19–32)
Calcium: 9.4 mg/dL (ref 8.4–10.5)
Chloride: 104 mmol/L (ref 96–112)
Creatinine, Ser: 0.72 mg/dL (ref 0.50–1.10)
GFR calc Af Amer: >90 mL/min (ref >90)
GFR calc non Af Amer: >90 mL/min (ref >90)
Glucose, Bld: 120 mg/dL — ABNORMAL HIGH (ref 70–99)
Potassium: 3.5 mmol/L (ref 3.5–5.1)
Sodium: 139 mmol/L (ref 135–145)
Total Bilirubin: 0.9 mg/dL (ref 0.3–1.2)
Total Protein: 6.7 g/dL (ref 6.0–8.3)

## 2014-10-02 LAB — CBC WITH DIFFERENTIAL/PLATELET
Basophils Absolute: 0 10*3/uL (ref 0.0–0.1)
Basophils Relative: 0 % (ref 0–1)
Eosinophils Absolute: 0.3 10*3/uL (ref 0.0–0.7)
Eosinophils Relative: 4 % (ref 0–5)
HCT: 50.5 % — ABNORMAL HIGH (ref 36.0–46.0)
Hemoglobin: 17.2 g/dL — ABNORMAL HIGH (ref 12.0–15.0)
Lymphocytes Relative: 22 % (ref 12–46)
Lymphs Abs: 2 10*3/uL (ref 0.7–4.0)
MCH: 30.3 pg (ref 26.0–34.0)
MCHC: 34.1 g/dL (ref 30.0–36.0)
MCV: 88.9 fL (ref 78.0–100.0)
Monocytes Absolute: 0.5 10*3/uL (ref 0.1–1.0)
Monocytes Relative: 5 % (ref 3–12)
Neutro Abs: 6.2 10*3/uL (ref 1.7–7.7)
Neutrophils Relative %: 68 % (ref 43–77)
Platelets: 216 10*3/uL (ref 150–400)
RBC: 5.68 MIL/uL — ABNORMAL HIGH (ref 3.87–5.11)
RDW: 12.8 % (ref 11.5–15.5)
WBC: 9.1 10*3/uL (ref 4.0–10.5)

## 2014-10-02 MED ORDER — HEPARIN SOD (PORK) LOCK FLUSH 100 UNIT/ML IV SOLN
500.0000 [IU] | Freq: Once | INTRAVENOUS | Status: AC | PRN
  Administered 2014-10-02: 500 [IU]
  Filled 2014-10-02: qty 5

## 2014-10-02 MED ORDER — SODIUM CHLORIDE 0.9 % IV SOLN
600.0000 mg/m2 | Freq: Once | INTRAVENOUS | Status: AC
  Administered 2014-10-02: 1260 mg via INTRAVENOUS
  Filled 2014-10-02: qty 63

## 2014-10-02 MED ORDER — SODIUM CHLORIDE 0.9 % IV SOLN
Freq: Once | INTRAVENOUS | Status: AC
  Administered 2014-10-02: 10:00:00 via INTRAVENOUS
  Filled 2014-10-02: qty 5

## 2014-10-02 MED ORDER — SODIUM CHLORIDE 0.9 % IV SOLN
Freq: Once | INTRAVENOUS | Status: AC
Start: 2014-10-02 — End: 2014-10-02
  Administered 2014-10-02: 09:00:00 via INTRAVENOUS

## 2014-10-02 MED ORDER — FLUOROURACIL CHEMO INJECTION 2.5 GM/50ML
600.0000 mg/m2 | Freq: Once | INTRAVENOUS | Status: AC
  Administered 2014-10-02: 1250 mg via INTRAVENOUS
  Filled 2014-10-02: qty 25

## 2014-10-02 MED ORDER — SODIUM CHLORIDE 0.9 % IV SOLN: 150.0000 mg | Freq: Once | INTRAVENOUS | Status: DC

## 2014-10-02 MED ORDER — SODIUM CHLORIDE 0.9 % IJ SOLN
10.0000 mL | INTRAMUSCULAR | Status: DC | PRN
  Administered 2014-10-02: 10 mL
  Filled 2014-10-02: qty 10

## 2014-10-02 MED ORDER — DEXAMETHASONE SODIUM PHOSPHATE 10 MG/ML IJ SOLN: 10.0000 mg | Freq: Once | INTRAMUSCULAR | Status: DC

## 2014-10-02 MED ORDER — PALONOSETRON HCL INJECTION 0.25 MG/5ML
0.2500 mg | Freq: Once | INTRAVENOUS | Status: AC
  Administered 2014-10-02: 0.25 mg via INTRAVENOUS
  Filled 2014-10-02: qty 5

## 2014-10-02 MED ORDER — LORAZEPAM 2 MG/ML IJ SOLN
0.5000 mg | Freq: Once | INTRAMUSCULAR | Status: AC
  Administered 2014-10-02: 0.5 mg via INTRAVENOUS
  Filled 2014-10-02: qty 1

## 2014-10-02 MED ORDER — METHOTREXATE SODIUM CHEMO INJECTION 25 MG/ML
40.0000 mg/m2 | Freq: Once | INTRAMUSCULAR | Status: AC
  Administered 2014-10-02: 85 mg via INTRAVENOUS
  Filled 2014-10-02: qty 3.4

## 2014-10-02 NOTE — Progress Notes (Signed)
Consent obtained for Cytoxan, Methotrexate, 5FU. Calendar given to patient the other day. Patient seen by Josephina Gip this am also. Distress screening done.

## 2014-10-02 NOTE — Patient Instructions (Signed)
[facility] Discharge Instructions for Patients Receiving Chemotherapy  Today you received the following chemotherapy agents Cycle 1 Cytoxan,Methotrexate and Adrucil.  To help prevent nausea and vomiting after your treatment, we encourage you to take your nausea medication as instructed. If you develop nausea and vomiting that is not controlled by your nausea medication, call the clinic. If it is after clinic hours your family physician or the after hours number for the clinic or go to the Emergency Department. BELOW ARE SYMPTOMS THAT SHOULD BE REPORTED IMMEDIATELY:  *FEVER GREATER THAN 101.0 F  *CHILLS WITH OR WITHOUT FEVER  NAUSEA AND VOMITING THAT IS NOT CONTROLLED WITH YOUR NAUSEA MEDICATION  *UNUSUAL SHORTNESS OF BREATH  *UNUSUAL BRUISING OR BLEEDING  TENDERNESS IN MOUTH AND THROAT WITH OR WITHOUT PRESENCE OF ULCERS  *URINARY PROBLEMS  *BOWEL PROBLEMS  UNUSUAL RASH Items with * indicate a potential emergency and should be followed up as soon as possible.  Return to clinic tomorrow for Neulasta injection as ordered. Return as scheduled.  I have been informed and understand all the instructions given to me. I know to contact the clinic, my physician, or go to the Emergency Department if any problems should occur. I do not have any questions at this time, but understand that I may call the clinic during office hours or the Patient Navigator at [phone] should I have any questions or need assistance in obtaining follow up care.    __________________________________________  _____________  __________ Signature of Patient or Authorized Representative            Date                   Time    __________________________________________ Nurse's Signature

## 2014-10-02 NOTE — Progress Notes (Signed)
Tolerated chemo well. 

## 2014-10-03 ENCOUNTER — Other Ambulatory Visit (HOSPITAL_COMMUNITY): Payer: Self-pay

## 2014-10-03 ENCOUNTER — Encounter (HOSPITAL_BASED_OUTPATIENT_CLINIC_OR_DEPARTMENT_OTHER): Payer: 59

## 2014-10-03 DIAGNOSIS — Z5189 Encounter for other specified aftercare: Secondary | ICD-10-CM

## 2014-10-03 DIAGNOSIS — C50412 Malignant neoplasm of upper-outer quadrant of left female breast: Secondary | ICD-10-CM | POA: Diagnosis not present

## 2014-10-03 MED ORDER — PEGFILGRASTIM INJECTION 6 MG/0.6ML ~~LOC~~
PREFILLED_SYRINGE | SUBCUTANEOUS | Status: AC
  Filled 2014-10-03: qty 0.6

## 2014-10-03 MED ORDER — PEGFILGRASTIM INJECTION 6 MG/0.6ML ~~LOC~~
6.0000 mg | PREFILLED_SYRINGE | Freq: Once | SUBCUTANEOUS | Status: AC
Start: 2014-10-03 — End: 2014-10-03
  Administered 2014-10-03: 6 mg via SUBCUTANEOUS

## 2014-10-03 NOTE — Patient Instructions (Signed)
[place] at [facility] Discharge Instructions  RECOMMENDATIONS MADE BY THE CONSULTANT AND ANY TEST RESULTS WILL BE SENT TO YOUR REFERRING PHYSICIAN.  Neulasta injection today as ordered. Return as scheduled.  Thank you for choosing [place] at [facility] to provide your oncology and hematology care.  To afford each patient quality time with our provider, please arrive at least 15 minutes before your scheduled appointment time.    You need to re-schedule your appointment should you arrive 10 or more minutes late.  We strive to give you quality time with our providers, and arriving late affects you and other patients whose appointments are after yours.  Also, if you no show three or more times for appointments you may be dismissed from the clinic at the providers discretion.     Again, thank you for choosing [facility].  Our hope is that these requests will decrease the amount of time that you wait before being seen by our physicians.       _____________________________________________________________  Should you have questions after your visit to [facility], please contact our office at [phone] between the hours of 8:30 a.m. and 4:30 p.m.  Voicemails left after 4:30 p.m. will not be returned until the following business day.  For prescription refill requests, have your pharmacy contact our office.

## 2014-10-03 NOTE — Progress Notes (Signed)
Regina Mcmahon presents today for injection per MD orders. Neulasta 6mg  administered SQ in left Abdomen. Administration without incident. Patient tolerated well.

## 2014-10-06 ENCOUNTER — Telehealth (HOSPITAL_COMMUNITY): Payer: Self-pay | Admitting: *Deleted

## 2014-10-06 NOTE — Telephone Encounter (Signed)
I called patient in f/u to her call placed to triage line 10/04/2014 @6 :35. She had constipation with bleeding noticed on toilet tissue. She took 5 stool softeners and eventually had a good bowel movement on Saturday night. She had bleeding with this BM, has not had any bleeding since. She was advised to take 2 stool softeners daily and increase if needed to keep stool soft. Verbalized understanding and will call us if needed.

## 2014-10-07 ENCOUNTER — Encounter: Payer: Self-pay | Admitting: *Deleted

## 2014-10-07 NOTE — Progress Notes (Signed)
[place] Psychosocial Distress Screening Clinical Social Work  Clinical Social Work was referred by distress screening protocol.  The patient scored a 9 on the Psychosocial Distress Thermometer which indicates severe distress. Clinical Social Worker phoned pt to assess for distress and other psychosocial needs as pt does not have appt scheduled on day when CSW is onsite. CSW had to leave message for pt to return CSW call. CSW will continue to be available for pt. CSW made aware chaplain met with pt day of visit at completion of distress screen.   ON[facility]N DISTRESS SCREENING 10/02/2014  Screening Type Initial Screening  Distress experienced in past week (1-10) 9  Practical problem type (No Data)  Family Problem type   Emotional problem type Depression;Nervousness/Anxiety;Adjusting to illness;Isolation/feeling alone;Adjusting to appearance changes  Spiritual/Religous concerns type Facing my mortality;Loss of sense of purpose  Information Concerns Type   Physical Problem type   Physician notified of physical symptoms Yes  Referral to clinical social work Yes  Referral to dietition No  Referral to financial advocate Yes  Referral to palliative care (No Data)  Other     Clinical Social Worker follow up needed: Yes.    If yes, follow up plan: See above  Loren Racer, [place] Tuesdays 8:30-1pm Wednesdays 8:30-12pm  [phone]

## 2014-10-10 ENCOUNTER — Encounter (HOSPITAL_BASED_OUTPATIENT_CLINIC_OR_DEPARTMENT_OTHER): Payer: 59 | Admitting: Hematology & Oncology

## 2014-10-10 ENCOUNTER — Ambulatory Visit (HOSPITAL_COMMUNITY): Payer: 59 | Admitting: Hematology & Oncology

## 2014-10-10 ENCOUNTER — Encounter (HOSPITAL_COMMUNITY): Payer: Self-pay | Admitting: Hematology & Oncology

## 2014-10-10 ENCOUNTER — Other Ambulatory Visit (HOSPITAL_COMMUNITY): Payer: 59

## 2014-10-10 VITALS — BP 139/79 | HR 69 | Temp 98.5°F | Resp 20 | Wt 223.8 lb

## 2014-10-10 DIAGNOSIS — E876 Hypokalemia: Secondary | ICD-10-CM | POA: Diagnosis not present

## 2014-10-10 DIAGNOSIS — R319 Hematuria, unspecified: Secondary | ICD-10-CM

## 2014-10-10 DIAGNOSIS — Z720 Tobacco use: Secondary | ICD-10-CM

## 2014-10-10 DIAGNOSIS — C50412 Malignant neoplasm of upper-outer quadrant of left female breast: Secondary | ICD-10-CM | POA: Diagnosis not present

## 2014-10-10 DIAGNOSIS — F419 Anxiety disorder, unspecified: Secondary | ICD-10-CM | POA: Diagnosis not present

## 2014-10-10 LAB — URINALYSIS, ROUTINE W REFLEX MICROSCOPIC
Bilirubin Urine: NEGATIVE
Glucose, UA: NEGATIVE mg/dL
Hgb urine dipstick: NEGATIVE
Ketones, ur: NEGATIVE mg/dL
Leukocytes, UA: NEGATIVE
Nitrite: NEGATIVE
Protein, ur: NEGATIVE mg/dL
Specific Gravity, Urine: 1.015 (ref 1.005–1.030)
Urobilinogen, UA: 0.2 mg/dL (ref 0.0–1.0)
pH: 6 (ref 5.0–8.0)

## 2014-10-10 MED ORDER — OMEPRAZOLE 40 MG PO CPDR: 40.0000 mg | DELAYED_RELEASE_CAPSULE | Freq: Every day | ORAL | Status: DC

## 2014-10-10 NOTE — Progress Notes (Deleted)
[place] at [facility] Discharge Instructions  RECOMMENDATIONS MADE BY THE CONSULTANT AND ANY TEST RESULTS WILL BE SENT TO YOUR REFERRING PHYSICIAN.  Exam and discussion by Dr. Whitney Muse. Return 10/23/14 for next treatment. Labs will be drawn at that time. Call for any symptoms or concerns.   Thank you for choosing [place] at [facility] to provide your oncology and hematology care.  To afford each patient quality time with our provider, please arrive at least 15 minutes before your scheduled appointment time.    You need to re-schedule your appointment should you arrive 10 or more minutes late.  We strive to give you quality time with our providers, and arriving late affects you and other patients whose appointments are after yours.  Also, if you no show three or more times for appointments you may be dismissed from the clinic at the providers discretion.     Again, thank you for choosing [facility].  Our hope is that these requests will decrease the amount of time that you wait before being seen by our physicians.       _____________________________________________________________  Should you have questions after your visit to [facility], please contact our office at [phone] between the hours of 8:30 a.m. and 4:30 p.m.  Voicemails left after 4:30 p.m. will not be returned until the following business day.  For prescription refill requests, have your pharmacy contact our office.

## 2014-10-10 NOTE — Patient Instructions (Signed)
[place] at [facility] Discharge Instructions  RECOMMENDATIONS MADE BY THE CONSULTANT AND ANY TEST RESULTS WILL BE SENT TO YOUR REFERRING PHYSICIAN.  Exam and discussion with Dr. Whitney Muse. Return 10/23/14 for treatment. Labs will be drawn at that time. Call with any new symptoms or concerns.   Thank you for choosing [place] at [facility] to provide your oncology and hematology care.  To afford each patient quality time with our provider, please arrive at least 15 minutes before your scheduled appointment time.    You need to re-schedule your appointment should you arrive 10 or more minutes late.  We strive to give you quality time with our providers, and arriving late affects you and other patients whose appointments are after yours.  Also, if you no show three or more times for appointments you may be dismissed from the clinic at the providers discretion.     Again, thank you for choosing [facility].  Our hope is that these requests will decrease the amount of time that you wait before being seen by our physicians.       _____________________________________________________________  Should you have questions after your visit to [facility], please contact our office at [phone] between the hours of 8:30 a.m. and 4:30 p.m.  Voicemails left after 4:30 p.m. will not be returned until the following business day.  For prescription refill requests, have your pharmacy contact our office.

## 2014-10-10 NOTE — Progress Notes (Signed)
[facility] Progress Note  Patient Care Team: Sinda Du, MD as PCP - General (Internal Medicine)  CHIEF COMPLAINTS/PURPOSE OF CONSULTATION:  L breast cancer History of L breast core biopsy for microcalcifications in 2014, fibroadenoma. CMF cycle 1 on 10/02/2014     Carcinoma of upper-outer quadrant of left female breast   06/24/2014 Mammogram Irregular, heterogeneous mass-like area in the 2 o'clock position of the left breast, corresponding to the palpable mass, as described above. This has imaging features suspicious for malignancy.   07/01/2014 Initial Biopsy ER+ 100%, PR+ 91%, Ki-67 32% Invasive mammary carcinoma with lobular features   08/20/2014 Surgery Left breast seed localized lumpectomy and left axilla sentinel lymph node biopsy. Final Staging TNM: pT2, pN1a, pMX.   09/17/2014 Surgery port-a cath placement and re-excision   10/02/2014 -  Chemotherapy CMF.  patient refused Taxane-containing chemotherapy.    HISTORY OF PRESENTING ILLNESS:  Regina Mcmahon 64 y.o. female is here because of newly diagnosed breast cancer. She has completed her first cycle of chemotherapy with CMF. She had some mild nausea and her anti-emetics relieved this. She takes a stool softener every day. She has difficulty sleeping but this is chronic she does take Tylenol PM. She had a drop of blood in her stool but states she had severe constipation preceding this episode. She has had no additional bleeding. She was shocked at how well she did with treatment. Initially she was concerned that she would not be able to care for herself if she did chemotherapy.  She has however increased her smoking because of her nerves.  MEDICAL HISTORY:  Past Medical History  Diagnosis Date  . Hypertension   . Panic attacks   . Chronic airway obstruction   . Pain     Hx.pain in joint involving pelvic region and thigh; pain in limb  . Sinusitis   . Asthma   . Palpitations   . Hemorrhage rectum    and anus.  . Symptomatic menopausal or female climacteric states   . Asymptomatic varicose veins   . Other symptoms involving digestive system(787.99)   . Depression   . Breast cancer 06/2014    left  . Insomnia     SURGICAL HISTORY: Past Surgical History  Procedure Laterality Date  . Cesarean section    . Cholecystectomy  1985  . Tubal ligation    . Colonoscopy    . Radioactive seed guided mastectomy with axillary sentinel lymph node biopsy Left 08/20/2014    Procedure: LEFT BREAST LUMPECTOMY WITH RADIOACTIVE SEED LOCALIZATION AND SENTINEL LYMPH NODE MAPPING;  Surgeon: Erroll Luna, MD;  Location: [facility];  Service: General;  Laterality: Left;  . Portacath placement Right 09/17/2014    Procedure: INSERTION PORT-A-CATH WITH ULTRASOUND;  Surgeon: Erroll Luna, MD;  Location: [facility];  Service: General;  Laterality: Right;  IJ  . Re-excision of breast lumpectomy Left 09/17/2014    Procedure: RE-EXCISION OF BREAST LUMPECTOMY;  Surgeon: Erroll Luna, MD;  Location: [facility];  Service: General;  Laterality: Left;    SOCIAL HISTORY: History   Social History  . Marital Status: Divorced    Spouse Name: N/A  . Number of Children: 2  . Years of Education: N/A   Occupational History  . Not on file.   Social History Main Topics  . Smoking status: Current Every Day Smoker -- 0.50 packs/day    Types: Cigarettes  . Smokeless tobacco: Never Used  . Alcohol Use:  No  . Drug Use: No  . Sexual Activity: No   Other Topics Concern  . Not on file   Social History Narrative    FAMILY HISTORY: Family History  Problem Relation Age of Onset  . Cancer Mother     ovarian; deceased age 52  . Diabetes Mother   . Other Father     died in MVA at age 87  . Other Sister     mitral valve disorder  . Cancer Brother     colon; deceased age 38  . Cancer Paternal Aunt     bone or blood   indicated that her mother is deceased. She  indicated that her father is deceased. She indicated that her brother is deceased. She indicated that her paternal aunt is deceased.   ALLERGIES:  has No Known Allergies.  MEDICATIONS:  Current Outpatient Prescriptions  Medication Sig Dispense Refill  . acetaminophen (TYLENOL) 325 MG tablet Take 650 mg by mouth every 6 (six) hours as needed.    Marland Kitchen albuterol (PROVENTIL HFA;VENTOLIN HFA) 108 (90 BASE) MCG/ACT inhaler Inhale 2 puffs into the lungs every 6 (six) hours as needed for wheezing or shortness of breath.    . ALPRAZolam (XANAX) 0.5 MG tablet Take 0.5 mg by mouth 3 (three) times daily as needed for anxiety.    . Cyclophosphamide (CYTOXAN IJ) Inject as directed. Every 21 days starting 10/02/14    . diphenhydramine-acetaminophen (TYLENOL PM) 25-500 MG TABS Take 2 tablets by mouth at bedtime.    . fluorouracil in sodium chloride 0.9 % 50 mL Inject into the vein once. Every 21 days starting 10/02/14    . ibuprofen (ADVIL,MOTRIN) 200 MG tablet Take 200 mg by mouth every 6 (six) hours as needed.    . lidocaine-prilocaine (EMLA) cream Apply a quarter size amount to port site 1 hour prior to chemo. Do not rub in. Cover with plastic wrap. 30 g 3  . lisinopril-hydrochlorothiazide (PRINZIDE) 10-12.5 MG per tablet Take 1 tablet by mouth daily. 30 tablet 0  . METHOTREXATE SODIUM IJ Inject as directed. Every 21 days starting 10/02/14    . ondansetron (ZOFRAN) 8 MG tablet Take 1 tablet every 8 hours as needed for nausea/vomiting. 30 tablet 2  . oxyCODONE-acetaminophen (ROXICET) 5-325 MG per tablet Take 1 tablet by mouth every 4 (four) hours as needed. (Patient not taking: Reported on 09/15/2014) 30 tablet 0  . oxyCODONE-acetaminophen (ROXICET) 5-325 MG per tablet Take 1-2 tablets by mouth every 4 (four) hours as needed. (Patient not taking: Reported on 09/24/2014) 30 tablet 0  . prochlorperazine (COMPAZINE) 10 MG tablet Take 1 tablet (10 mg total) by mouth every 6 (six) hours as needed (Nausea or vomiting). 30  tablet 1  . tiZANidine (ZANAFLEX) 4 MG tablet      No current facility-administered medications for this visit.    Review of Systems  Constitutional: Negative for fever, chills, weight loss and malaise/fatigue.  HENT: Negative for congestion, hearing loss, nosebleeds, sore throat and tinnitus.   Eyes: Negative for blurred vision, double vision, pain and discharge.  Respiratory: Negative for cough, hemoptysis, sputum production, shortness of breath and wheezing.   Cardiovascular: Negative for chest pain, palpitations, claudication, leg swelling and PND.  Gastrointestinal: Positive for constipation. Negative for heartburn, nausea, vomiting, abdominal pain, diarrhea, blood in stool and melena.  Genitourinary: Negative for dysuria, urgency, frequency and hematuria.  Musculoskeletal: Negative for myalgias, joint pain and falls.  Skin: Negative for itching and rash.  Neurological: Negative for dizziness,  tingling, tremors, sensory change, speech change, focal weakness, seizures, loss of consciousness, weakness and headaches.  Endo/Heme/Allergies: Does not bruise/bleed easily.  Psychiatric/Behavioral: Negative for depression, suicidal ideas, memory loss and substance abuse. The patient is nervous/anxious and has insomnia.     PHYSICAL EXAMINATION: ECOG PERFORMANCE STATUS: 0 - Asymptomatic  Filed Vitals:   10/10/14 1423  BP: 139/79  Pulse: 69  Temp: 98.5 F (36.9 C)  Resp: 20   Filed Weights   10/10/14 1423  Weight: 223 lb 12.8 oz (101.515 kg)     Physical Exam  Constitutional: She is oriented to person, place, and time and well-developed, well-nourished, and in no distress.  HENT:  Head: Normocephalic and atraumatic.  Nose: Nose normal.  Mouth/Throat: Oropharynx is clear and moist. No oropharyngeal exudate.  Eyes: Conjunctivae and EOM are normal. Pupils are equal, round, and reactive to light. Right eye exhibits no discharge. Left eye exhibits no discharge. No scleral icterus.    Neck: Normal range of motion. Neck supple. No tracheal deviation present. No thyromegaly present.  Cardiovascular: Normal rate, regular rhythm and normal heart sounds.  Exam reveals no gallop and no friction rub.   No murmur heard. Pulmonary/Chest: Effort normal and breath sounds normal. She has no wheezes. She has no rales.  Abdominal: Soft. Bowel sounds are normal. She exhibits no distension and no mass. There is no tenderness. There is no rebound and no guarding.  Musculoskeletal: Normal range of motion. She exhibits no edema.  Lymphadenopathy:    She has no cervical adenopathy.  Neurological: She is alert and oriented to person, place, and time. She has normal reflexes. No cranial nerve deficit. Gait normal. Coordination normal.  Skin: Skin is warm and dry. No rash noted.  Psychiatric: Mood, memory, affect and judgment normal.  Nursing note and vitals reviewed.    LABORATORY DATA:  I have reviewed the data as listed Lab Results  Component Value Date   WBC 9.1 10/02/2014   HGB 17.2* 10/02/2014   HCT 50.5* 10/02/2014   MCV 88.9 10/02/2014   PLT 216 10/02/2014     Chemistry      Component Value Date/Time   NA 139 10/02/2014 0925   K 3.5 10/02/2014 0925   CL 104 10/02/2014 0925   CO2 29 10/02/2014 0925   BUN 14 10/02/2014 0925   CREATININE 0.72 10/02/2014 0925      Component Value Date/Time   CALCIUM 9.4 10/02/2014 0925   ALKPHOS 64 10/02/2014 0925   AST 14 10/02/2014 0925   ALT 14 10/02/2014 0925   BILITOT 0.9 10/02/2014 0925      ASSESSMENT & PLAN:   Stage II, infiltrating ductal carcinoma of the left breast, ER positive, PR positive, HER-2 negative Anxiety Tobacco abuse  She was reluctant to do chemotherapy because of difficulties she had seen other family members go through with treatment. She did not want to receive Taxol-based therapy because her mother was treated with Taxol for ovarian cancer and did very poorly. She has been treated now with CMF. She has  done very well.  She is not interested in taking anything currently for her anxiety. She states it is a chronic problem. I have written her a prescription for Prilosec to take daily while she continues with chemotherapy. We will plan on seeing her back prior to her next cycle with an office visit and laboratory studies.  We again addressed the importance of smoking cessation. I gave her several options to help her discontinue smoking. She would  like to work on this by herself first. This note was signed electronically   Molli Hazard, MD 10/10/2014 2:42 PM

## 2014-10-13 NOTE — Progress Notes (Signed)
[facility] Progress Note  Patient Care Team: Sinda Du, MD as PCP - General (Internal Medicine)  CHIEF COMPLAINTS/PURPOSE OF CONSULTATION:  L breast cancer History of L breast core biopsy for microcalcifications in 2014, fibroadenoma.    Carcinoma of upper-outer quadrant of left female breast   06/24/2014 Mammogram Irregular, heterogeneous mass-like area in the 2 o'clock position of the left breast, corresponding to the palpable mass, as described above. This has imaging features suspicious for malignancy.   07/01/2014 Initial Biopsy ER+ 100%, PR+ 91%, Ki-67 32% Invasive mammary carcinoma with lobular features   08/20/2014 Surgery Left breast seed localized lumpectomy and left axilla sentinel lymph node biopsy. Final Staging TNM: pT2, pN1a, pMX.   09/17/2014 Surgery port-a cath placement and re-excision    HISTORY OF PRESENTING ILLNESS:  Regina Mcmahon 64 y.o. female is here because of newly diagnosed breast cancer. She will undergo re-excision tomorrow and is scheduled to have a port placed. At our last meeting she opted to proceed with chemotherapy. Today she states she is not going to do it. She is very fearful that she will not be able to care for herself during treatment and care for her pets at home. She also is not interested in taking a "taxane." Her mother had ovarian cancer and she feels this is what ultimately ended her life.   MEDICAL HISTORY:  Past Medical History  Diagnosis Date  . Hypertension   . Panic attacks   . Chronic airway obstruction   . Pain     Hx.pain in joint involving pelvic region and thigh; pain in limb  . Sinusitis   . Asthma   . Palpitations   . Hemorrhage rectum     and anus.  . Symptomatic menopausal or female climacteric states   . Asymptomatic varicose veins   . Other symptoms involving digestive system(787.99)   . Depression   . Breast cancer 06/2014    left  . Insomnia    she had her first menstrual cycle at the age  of 53. Her first child was born at the age of 41.  SURGICAL HISTORY: Past Surgical History  Procedure Laterality Date  . Cesarean section    . Cholecystectomy  1985  . Tubal ligation    . Colonoscopy    . Radioactive seed guided mastectomy with axillary sentinel lymph node biopsy Left 08/20/2014    Procedure: LEFT BREAST LUMPECTOMY WITH RADIOACTIVE SEED LOCALIZATION AND SENTINEL LYMPH NODE MAPPING;  Surgeon: Erroll Luna, MD;  Location: [facility];  Service: General;  Laterality: Left;  . Portacath placement Right 09/17/2014    Procedure: INSERTION PORT-A-CATH WITH ULTRASOUND;  Surgeon: Erroll Luna, MD;  Location: [facility];  Service: General;  Laterality: Right;  IJ  . Re-excision of breast lumpectomy Left 09/17/2014    Procedure: RE-EXCISION OF BREAST LUMPECTOMY;  Surgeon: Erroll Luna, MD;  Location: [facility];  Service: General;  Laterality: Left;    SOCIAL HISTORY: History   Social History  . Marital Status: Divorced    Spouse Name: N/A  . Number of Children: 2  . Years of Education: N/A   Occupational History  . Not on file.   Social History Main Topics  . Smoking status: Current Every Day Smoker -- 0.50 packs/day    Types: Cigarettes  . Smokeless tobacco: Never Used  . Alcohol Use: No  . Drug Use: No  . Sexual Activity: No   Other Topics Concern  .  Not on file   Social History Narrative   she lives alone. She works and is physically active. She smokes approximately a half pack a day. She denies any alcohol use.  FAMILY HISTORY: Family History  Problem Relation Age of Onset  . Cancer Mother     ovarian; deceased age 29  . Diabetes Mother   . Other Father     died in MVA at age 1  . Other Sister     mitral valve disorder  . Cancer Brother     colon; deceased age 67  . Cancer Paternal Aunt     bone or blood   indicated that her mother is deceased. She indicated that her father is deceased. She indicated  that her brother is deceased. She indicated that her paternal aunt is deceased.   Brother died of stage IV colon cancer. Her mother died from ovarian cancer.  ALLERGIES:  has No Known Allergies.  MEDICATIONS:  Current Outpatient Prescriptions  Medication Sig Dispense Refill  . albuterol (PROVENTIL HFA;VENTOLIN HFA) 108 (90 BASE) MCG/ACT inhaler Inhale 2 puffs into the lungs every 6 (six) hours as needed for wheezing or shortness of breath.    . diphenhydramine-acetaminophen (TYLENOL PM) 25-500 MG TABS Take 2 tablets by mouth at bedtime.    Marland Kitchen ibuprofen (ADVIL,MOTRIN) 200 MG tablet Take 200 mg by mouth every 6 (six) hours as needed.    Marland Kitchen acetaminophen (TYLENOL) 325 MG tablet Take 650 mg by mouth every 6 (six) hours as needed.    . ALPRAZolam (XANAX) 0.5 MG tablet Take 0.5 mg by mouth 3 (three) times daily as needed for anxiety.    . Cyclophosphamide (CYTOXAN IJ) Inject as directed. Every 21 days starting 10/02/14    . fluorouracil in sodium chloride 0.9 % 50 mL Inject into the vein once. Every 21 days starting 10/02/14    . hydrochlorothiazide (MICROZIDE) 12.5 MG capsule Take 12.5 mg by mouth daily.    Marland Kitchen lidocaine-prilocaine (EMLA) cream Apply a quarter size amount to port site 1 hour prior to chemo. Do not rub in. Cover with plastic wrap. 30 g 3  . METHOTREXATE SODIUM IJ Inject as directed. Every 21 days starting 10/02/14    . omeprazole (PRILOSEC) 40 MG capsule Take 1 capsule (40 mg total) by mouth daily. 30 capsule 4  . ondansetron (ZOFRAN) 8 MG tablet Take 1 tablet every 8 hours as needed for nausea/vomiting. 30 tablet 2  . oxyCODONE-acetaminophen (ROXICET) 5-325 MG per tablet Take 1 tablet by mouth every 4 (four) hours as needed. 30 tablet 0  . oxyCODONE-acetaminophen (ROXICET) 5-325 MG per tablet Take 1-2 tablets by mouth every 4 (four) hours as needed. 30 tablet 0  . prochlorperazine (COMPAZINE) 10 MG tablet Take 1 tablet (10 mg total) by mouth every 6 (six) hours as needed (Nausea or  vomiting). 30 tablet 1  . tiZANidine (ZANAFLEX) 4 MG tablet      No current facility-administered medications for this visit.    Review of Systems  Constitutional: Negative for fever, chills, weight loss and malaise/fatigue.  HENT: Negative for congestion, hearing loss, nosebleeds, sore throat and tinnitus.   Eyes: Negative for blurred vision, double vision, pain and discharge.  Respiratory: Negative for cough, hemoptysis, sputum production, shortness of breath and wheezing.   Cardiovascular: Negative for chest pain, palpitations, claudication, leg swelling and PND.  Gastrointestinal: Negative for heartburn, nausea, vomiting, abdominal pain, diarrhea, constipation, blood in stool and melena.  Genitourinary: Negative for dysuria, urgency, frequency and hematuria.  Musculoskeletal: Negative for myalgias, joint pain and falls.  Skin: Negative for itching and rash.  Neurological: Negative for dizziness, tingling, tremors, sensory change, speech change, focal weakness, seizures, loss of consciousness, weakness and headaches.  Endo/Heme/Allergies: Does not bruise/bleed easily.  Psychiatric/Behavioral: Negative for depression, suicidal ideas, memory loss and substance abuse. The patient is nervous/anxious and has insomnia.     PHYSICAL EXAMINATION: ECOG PERFORMANCE STATUS: 0 - Asymptomatic  Filed Vitals:   09/15/14 1200  BP: 141/81  Pulse: 72  Temp: 98.5 F (36.9 C)  Resp: 18   Filed Weights   09/15/14 1200  Weight: 224 lb 12.8 oz (101.969 kg)     Physical Exam  Constitutional: She is oriented to person, place, and time and well-developed, well-nourished, and in no distress.  HENT:  Head: Normocephalic and atraumatic.  Nose: Nose normal.  Mouth/Throat: Oropharynx is clear and moist. No oropharyngeal exudate.  Eyes: Conjunctivae and EOM are normal. Pupils are equal, round, and reactive to light. Right eye exhibits no discharge. Left eye exhibits no discharge. No scleral icterus.    Neck: Normal range of motion. Neck supple. No tracheal deviation present. No thyromegaly present.  Cardiovascular: Normal rate, regular rhythm and normal heart sounds.  Exam reveals no gallop and no friction rub.   No murmur heard. Pulmonary/Chest: Effort normal and breath sounds normal. She has no wheezes. She has no rales.  Abdominal: Soft. Bowel sounds are normal. She exhibits no distension and no mass. There is no tenderness. There is no rebound and no guarding.  Musculoskeletal: Normal range of motion. She exhibits no edema.  Lymphadenopathy:    She has no cervical adenopathy.  Neurological: She is alert and oriented to person, place, and time. She has normal reflexes. No cranial nerve deficit. Gait normal. Coordination normal.  Skin: Skin is warm and dry. No rash noted.  Psychiatric: Mood, memory, affect and judgment normal.  Nursing note and vitals reviewed.    LABORATORY DATA:  I have reviewed the data as listed Lab Results  Component Value Date   WBC 9.1 10/02/2014   HGB 17.2* 10/02/2014   HCT 50.5* 10/02/2014   MCV 88.9 10/02/2014   PLT 216 10/02/2014     Chemistry      Component Value Date/Time   NA 139 10/02/2014 0925   K 3.5 10/02/2014 0925   CL 104 10/02/2014 0925   CO2 29 10/02/2014 0925   BUN 14 10/02/2014 0925   CREATININE 0.72 10/02/2014 0925      Component Value Date/Time   CALCIUM 9.4 10/02/2014 0925   ALKPHOS 64 10/02/2014 0925   AST 14 10/02/2014 0925   ALT 14 10/02/2014 0925   BILITOT 0.9 10/02/2014 0925       ASSESSMENT & PLAN:  T2 N1 invasive ductal carcinoma of the left breast, ER positive, PR positive, HER-2 negative X  Tumor deposit within the lymph node is 29.23 cm  64 year old female who presents with a newly diagnosed invasive ductal carcinoma of the breast. I spent a great deal of time in discussion of recommended therapy for her breast cancer. I provided her with data to show her benefit from adjuvant chemotherapy. I also provided  her data to show her benefit with adjuvant endocrine therapy. We try to address her fears. I offered her other chemotherapy regimens.  I advised the patient that the most important thing she feels comfortable with her decision. I advised her if she is truly decided against chemotherapy to let Dr. Brantley Stage know prior to  her surgery tomorrow so she can avoid port placement.   I encouraged her to call with any problems or concerns. I will plan on seeing her back one week after her reexcision we will continue to readdress or concerns. When forward. If she opts not to pursue adjuvant chemotherapy we will get her back to Dr. Pablo Ledger for radiation.    Molli Hazard, MD  10/13/2014 7:19 PM

## 2014-10-14 NOTE — Progress Notes (Signed)
[facility] Progress Note  Patient Care Team: Sinda Du, MD as PCP - General (Internal Medicine)  CHIEF COMPLAINTS/PURPOSE OF CONSULTATION:  L breast cancer, ER+, PR+, Her2 neu negative. Stage II History of L breast core biopsy for microcalcifications in 2014, fibroadenoma.    Carcinoma of upper-outer quadrant of left female breast   06/24/2014 Mammogram Irregular, heterogeneous mass-like area in the 2 o'clock position of the left breast, corresponding to the palpable mass, as described above. This has imaging features suspicious for malignancy.   07/01/2014 Initial Biopsy ER+ 100%, PR+ 91%, Ki-67 32% Invasive mammary carcinoma with lobular features   08/20/2014 Surgery Left breast seed localized lumpectomy and left axilla sentinel lymph node biopsy. Final Staging TNM: pT2, pN1a, pMX.   09/17/2014 Surgery port-a cath placement and re-excision    HISTORY OF PRESENTING ILLNESS:  Regina Mcmahon 64 y.o. 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 Final staging was stage II. She has a port in place and has opted to proceed with chemotherapy but does not want to take a taxine. She is interested in other chemotherapy options. She has struggled a lot with her decision regarding chemotherapy because of fear she will not be able to care for herself. She cannot get over her other family members experiences with chemotherapy and cancer. She has no physical complaints today.  MEDICAL HISTORY:  Past Medical History  Diagnosis Date  . Hypertension   . Panic attacks   . Chronic airway obstruction   . Pain     Hx.pain in joint involving pelvic region and thigh; pain in limb  . Sinusitis   . Asthma   . Palpitations   . Hemorrhage rectum     and anus.  . Symptomatic menopausal or female climacteric states   . Asymptomatic varicose veins   . Other symptoms involving digestive system(787.99)   . Depression   .  Breast cancer 06/2014    left  . Insomnia    she had her first menstrual cycle at the age of 4. Her first child was born at the age of 10.  SURGICAL HISTORY: Past Surgical History  Procedure Laterality Date  . Cesarean section    . Cholecystectomy  1985  . Tubal ligation    . Colonoscopy    . Radioactive seed guided mastectomy with axillary sentinel lymph node biopsy Left 08/20/2014    Procedure: LEFT BREAST LUMPECTOMY WITH RADIOACTIVE SEED LOCALIZATION AND SENTINEL LYMPH NODE MAPPING;  Surgeon: Erroll Luna, MD;  Location: [facility];  Service: General;  Laterality: Left;  . Portacath placement Right 09/17/2014    Procedure: INSERTION PORT-A-CATH WITH ULTRASOUND;  Surgeon: Erroll Luna, MD;  Location: [facility] [facility];  Service: General;  Laterality: Right;  IJ  . Re-excision of breast lumpectomy Left 09/17/2014    Procedure: RE-EXCISION OF BREAST LUMPECTOMY;  Surgeon: Erroll Luna, MD;  Location: [place];  Service: General;  Laterality: Left;    SOCIAL HISTORY: History   Social History  . Marital Status: Divorced    Spouse Name: N/A  . Number of Children: 2  . Years of Education: N/A   Occupational History  . Not on file.   Social History Main Topics  . Smoking status: Current Every Day Smoker -- 0.50 packs/day    Types: Cigarettes  . Smokeless tobacco: Never Used  . Alcohol Use: No  . Drug Use: No  .  Sexual Activity: No   Other Topics Concern  . Not on file   Social History Narrative   she lives alone. She works and is physically active. She smokes approximately a half pack a day. She denies any alcohol use.  FAMILY HISTORY: Family History  Problem Relation Age of Onset  . Cancer Mother     ovarian; deceased age 38  . Diabetes Mother   . Other Father     died in MVA at age 78  . Other Sister     mitral valve disorder  . Cancer Brother     colon; deceased age 83  . Cancer Paternal Aunt     bone or blood    indicated that her mother is deceased. She indicated that her father is deceased. She indicated that her brother is deceased. She indicated that her paternal aunt is deceased.   Brother died of stage IV colon cancer. Her mother died from ovarian cancer.  ALLERGIES:  has No Known Allergies.  MEDICATIONS:  Current Outpatient Prescriptions  Medication Sig Dispense Refill  . acetaminophen (TYLENOL) 325 MG tablet Take 650 mg by mouth every 6 (six) hours as needed.    Marland Kitchen albuterol (PROVENTIL HFA;VENTOLIN HFA) 108 (90 BASE) MCG/ACT inhaler Inhale 2 puffs into the lungs every 6 (six) hours as needed for wheezing or shortness of breath.    . diphenhydramine-acetaminophen (TYLENOL PM) 25-500 MG TABS Take 2 tablets by mouth at bedtime.    Marland Kitchen ibuprofen (ADVIL,MOTRIN) 200 MG tablet Take 200 mg by mouth every 6 (six) hours as needed.    . ALPRAZolam (XANAX) 0.5 MG tablet Take 0.5 mg by mouth 3 (three) times daily as needed for anxiety.    . Cyclophosphamide (CYTOXAN IJ) Inject as directed. Every 21 days starting 10/02/14    . fluorouracil in sodium chloride 0.9 % 50 mL Inject into the vein once. Every 21 days starting 10/02/14    . hydrochlorothiazide (MICROZIDE) 12.5 MG capsule Take 12.5 mg by mouth daily.    Marland Kitchen lidocaine-prilocaine (EMLA) cream Apply a quarter size amount to port site 1 hour prior to chemo. Do not rub in. Cover with plastic wrap. 30 g 3  . METHOTREXATE SODIUM IJ Inject as directed. Every 21 days starting 10/02/14    . omeprazole (PRILOSEC) 40 MG capsule Take 1 capsule (40 mg total) by mouth daily. 30 capsule 4  . ondansetron (ZOFRAN) 8 MG tablet Take 1 tablet every 8 hours as needed for nausea/vomiting. 30 tablet 2  . oxyCODONE-acetaminophen (ROXICET) 5-325 MG per tablet Take 1 tablet by mouth every 4 (four) hours as needed. 30 tablet 0  . oxyCODONE-acetaminophen (ROXICET) 5-325 MG per tablet Take 1-2 tablets by mouth every 4 (four) hours as needed. 30 tablet 0  . prochlorperazine  (COMPAZINE) 10 MG tablet Take 1 tablet (10 mg total) by mouth every 6 (six) hours as needed (Nausea or vomiting). 30 tablet 1  . tiZANidine (ZANAFLEX) 4 MG tablet      No current facility-administered medications for this visit.    Review of Systems  Constitutional: Negative for fever, chills, weight loss and malaise/fatigue.  HENT: Negative for congestion, hearing loss, nosebleeds, sore throat and tinnitus.   Eyes: Negative for blurred vision, double vision, pain and discharge.  Respiratory: Negative for cough, hemoptysis, sputum production, shortness of breath and wheezing.   Cardiovascular: Negative for chest pain, palpitations, claudication, leg swelling and PND.  Gastrointestinal: Negative for heartburn, nausea, vomiting, abdominal pain, diarrhea, constipation, blood in stool and  melena.  Genitourinary: Negative for dysuria, urgency, frequency and hematuria.  Musculoskeletal: Negative for myalgias, joint pain and falls.  Skin: Negative for itching and rash.  Neurological: Negative for dizziness, tingling, tremors, sensory change, speech change, focal weakness, seizures, loss of consciousness, weakness and headaches.  Endo/Heme/Allergies: Does not bruise/bleed easily.  Psychiatric/Behavioral: Negative for depression, suicidal ideas, memory loss and substance abuse. The patient is nervous/anxious and has insomnia.     PHYSICAL EXAMINATION: ECOG PERFORMANCE STATUS: 0 - Asymptomatic  Filed Vitals:   09/24/14 1524  BP: 142/90  Pulse: 81  Temp: 97.5 F (36.4 C)  Resp: 18   There were no vitals filed for this visit.   Physical Exam  Constitutional: She is oriented to person, place, and time and well-developed, well-nourished, and in no distress.  HENT:  Head: Normocephalic and atraumatic.  Nose: Nose normal.  Mouth/Throat: Oropharynx is clear and moist. No oropharyngeal exudate.  Eyes: Conjunctivae and EOM are normal. Pupils are equal, round, and reactive to light. Right eye  exhibits no discharge. Left eye exhibits no discharge. No scleral icterus.  Neck: Normal range of motion. Neck supple. No tracheal deviation present. No thyromegaly present.  Cardiovascular: Normal rate, regular rhythm and normal heart sounds.  Exam reveals no gallop and no friction rub.   No murmur heard. Pulmonary/Chest: Effort normal and breath sounds normal. She has no wheezes. She has no rales.  Abdominal: Soft. Bowel sounds are normal. She exhibits no distension and no mass. There is no tenderness. There is no rebound and no guarding.  Musculoskeletal: Normal range of motion. She exhibits no edema.  Lymphadenopathy:    She has no cervical adenopathy.  Neurological: She is alert and oriented to person, place, and time. She has normal reflexes. No cranial nerve deficit. Gait normal. Coordination normal.  Skin: Skin is warm and dry. No rash noted.  Psychiatric: Mood, memory, affect and judgment normal.  Nursing note and vitals reviewed.    LABORATORY DATA:  I have reviewed the data as listed Lab Results  Component Value Date   WBC 9.1 10/02/2014   HGB 17.2* 10/02/2014   HCT 50.5* 10/02/2014   MCV 88.9 10/02/2014   PLT 216 10/02/2014     Chemistry      Component Value Date/Time   NA 139 10/02/2014 0925   K 3.5 10/02/2014 0925   CL 104 10/02/2014 0925   CO2 29 10/02/2014 0925   BUN 14 10/02/2014 0925   CREATININE 0.72 10/02/2014 0925      Component Value Date/Time   CALCIUM 9.4 10/02/2014 0925   ALKPHOS 64 10/02/2014 0925   AST 14 10/02/2014 0925   ALT 14 10/02/2014 0925   BILITOT 0.9 10/02/2014 0925       ASSESSMENT & PLAN:  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 Tumor is ER positive, PR positive, and HER-2 negative. Final staging was stage II. We discussed all  options for chemotherapy today including AC followed by taxane, and older regimens such as CMF. She is interested in proceeding with CMF. She is very firm in her decision. We will therefore arrange for chemotherapy teaching and set her up to begin as soon as possible. I will see her back one week post cycle 1 to assess tolerance. If she has additional questions or concerns in  the interim she knows to call.   Molli Hazard, MD  10/14/2014 8:28 PM

## 2014-10-15 ENCOUNTER — Telehealth (HOSPITAL_COMMUNITY): Payer: Self-pay

## 2014-10-15 NOTE — Telephone Encounter (Signed)
Call from sister stating that Regina Mcmahon has broken a tooth and is have significant pain and wants to know what to do.  Discussed with Robynn Pane, PA-C.  Instructed that her blood counts should be ok since her last treatment was 2 weeks ago and that she needs to make an appointment with her dentist.  If dentist has any questions, they can call and speak with our provider.  Verbalized understanding of instructions.

## 2014-10-22 NOTE — Progress Notes (Signed)
Regina Bogus, MD [place]  [place] [address]  Carcinoma of upper-outer quadrant of left female breast  Hypokalemia - Plan: potassium chloride SA (K-DUR,KLOR-CON) 20 MEQ tablet  CURRENT THERAPY: S/P cycle 1 of CMF on 10/02/2014 due to the patient refusing Taxane therapy.  INTERVAL HISTORY: Regina Mcmahon 64 y.o. female returns for followup of Stage II invasive ductal carcinoma of left breast, ER/PR +, Her2 negative, S/P left lumpectomy with SLN biopsy on 08/20/2014 and starting systemic chemotherapy on 10/02/2014 with CMF because patient refused Taxane-containing regimen.    Carcinoma of upper-outer quadrant of left female breast   06/24/2014 Mammogram Irregular, heterogeneous mass-like area in the 2 o'clock position of the left breast, corresponding to the palpable mass, as described above. This has imaging features suspicious for malignancy.   07/01/2014 Initial Biopsy ER+ 100%, PR+ 91%, Ki-67 32% Invasive mammary carcinoma with lobular features   08/20/2014 Surgery Left breast seed localized lumpectomy and left axilla sentinel lymph node biopsy. Final Staging TNM: pT2, pN1a, pMX.   09/17/2014 Surgery port-a cath placement and re-excision   10/02/2014 -  Chemotherapy CMF.  patient refused Taxane-containing chemotherapy.   I personally reviewed and went over laboratory results with the patient.  The results are noted within this dictation.  She is noted to by hypokalemic and this will be addressed today.  She tolerated her first cycle of therapy well without any complaints.  She is noted to be comfortable in the chemotherapy recliner on exam.  I reviewed her treatment plan with her which will include 6 cycles of chemotherapy.  She notes some serosanguinous fluid from the lateral aspect of her lumpectomy site.  She saw Dr. Brantley Stage 2 weeks ago for evaluation for this.  On exam today, there is no indication of infection.  Oncologically, she denies any  complaints and ROS questioning is negative.   Past Medical History  Diagnosis Date  . Hypertension   . Panic attacks   . Chronic airway obstruction   . Pain     Hx.pain in joint involving pelvic region and thigh; pain in limb  . Sinusitis   . Asthma   . Palpitations   . Hemorrhage rectum     and anus.  . Symptomatic menopausal or female climacteric states   . Asymptomatic varicose veins   . Other symptoms involving digestive system(787.99)   . Depression   . Breast cancer 06/2014    left  . Insomnia     has Anxiety; Blood in stool; Pain; Chronic airway obstruction; Diarrhea; Tobacco use disorder; Unspecified essential hypertension; and Carcinoma of upper-outer quadrant of left female breast on her problem list.     has No Known Allergies.  Ms. Monterosso does not currently have medications on file.  Past Surgical History  Procedure Laterality Date  . Cesarean section    . Cholecystectomy  1985  . Tubal ligation    . Colonoscopy    . Radioactive seed guided mastectomy with axillary sentinel lymph node biopsy Left 08/20/2014    Procedure: LEFT BREAST LUMPECTOMY WITH RADIOACTIVE SEED LOCALIZATION AND SENTINEL LYMPH NODE MAPPING;  Surgeon: Erroll Luna, MD;  Location: [facility];  Service: General;  Laterality: Left;  . Portacath placement Right 09/17/2014    Procedure: INSERTION PORT-A-CATH WITH ULTRASOUND;  Surgeon: Erroll Luna, MD;  Location: [facility];  Service: General;  Laterality: Right;  IJ  . Re-excision of breast lumpectomy Left 09/17/2014  Procedure: RE-EXCISION OF BREAST LUMPECTOMY;  Surgeon: Erroll Luna, MD;  Location: [facility];  Service: General;  Laterality: Left;    Denies any headaches, dizziness, double vision, fevers, chills, night sweats, nausea, vomiting, diarrhea, constipation, chest pain, heart palpitations, shortness of breath, blood in stool, black tarry stool, urinary pain, urinary burning, urinary  frequency, hematuria.   PHYSICAL EXAMINATION  ECOG PERFORMANCE STATUS: 0 - Asymptomatic  There were no vitals filed for this visit.  GENERAL:alert, no distress, well nourished, well developed, comfortable, cooperative, obese and smiling, in chemotherapy recliner and accompanied by daughter-in-law and sister. SKIN: skin color, texture, turgor are normal, no rashes or significant lesions HEAD: Normocephalic, No masses, lesions, tenderness or abnormalities EYES: normal, PERRLA, EOMI, Conjunctiva are pink and non-injected EARS: External ears normal OROPHARYNX:lips, buccal mucosa, and tongue normal and mucous membranes are moist  NECK: supple, no adenopathy, trachea midline LYMPH:  no palpable lymphadenopathy BREAST:left breast with lumpectomy site healing with some serosanguinous fluid leaking from most lateral site of surgery.  No erythema or heat.  Soft and supple. LUNGS: clear to auscultation  HEART: regular rate & rhythm ABDOMEN:abdomen soft, obese and normal bowel sounds BACK: Back symmetric, no curvature., No CVA tenderness EXTREMITIES:less then 2 second capillary refill, no joint deformities, effusion, or inflammation, no skin discoloration, no clubbing, no cyanosis  NEURO: alert & oriented x 3 with fluent speech, no focal motor/sensory deficits   LABORATORY DATA: CBC    Component Value Date/Time   WBC 9.4 10/23/2014 0925   RBC 5.42* 10/23/2014 0925   HGB 16.6* 10/23/2014 0925   HCT 48.2* 10/23/2014 0925   PLT 222 10/23/2014 0925   MCV 88.9 10/23/2014 0925   MCH 30.6 10/23/2014 0925   MCHC 34.4 10/23/2014 0925   RDW 13.5 10/23/2014 0925   LYMPHSABS 1.9 10/23/2014 0925   MONOABS 0.5 10/23/2014 0925   EOSABS 0.1 10/23/2014 0925   BASOSABS 0.0 10/23/2014 0925      Chemistry      Component Value Date/Time   NA 139 10/23/2014 0925   K 3.1* 10/23/2014 0925   CL 102 10/23/2014 0925   CO2 31 10/23/2014 0925   BUN 21 10/23/2014 0925   CREATININE 0.74 10/23/2014 0925       Component Value Date/Time   CALCIUM 9.3 10/23/2014 0925   ALKPHOS 63 10/23/2014 0925   AST 12 10/23/2014 0925   ALT 11 10/23/2014 0925   BILITOT 0.8 10/23/2014 0925       ASSESSMENT AND PLAN:  Carcinoma of upper-outer quadrant of left female breast Stage II invasive ductal carcinoma of left breast, ER/PR +, Her2 negative, S/P left lumpectomy with SLN biopsy on 08/20/2014 and starting systemic chemotherapy on 10/02/2014 with CMF because patient refused Taxane-containing regimen.  She is educated today that she will receive 6 cycles of CMF, followed by radiation, followed by endocrine therapy.  She is agreeable to this plan of treatment.  Starting cycle 2 of therapy today with excellent pre-chemotherapy labs. Elevated Hgb is noted and a Jak2 genotype was drawn today to evaluate for primary cause of polycythemia.  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  Disability forms have been completed this week and faxed appropriately.  She is given a hard copy of these completed papers.  Serosanguinous discharge from the lateral aspect of surgical site of left breast without any sign of infection and not in need of intervention.  Followed by Dr. Brantley Stage.  Return in 3  weeks for follow-up and call in the interim with any issues associated with treatment.   THERAPY PLAN:  She will continue with systemic chemotherapy x 6 cycles.  This will be followed by radiation therapy, then endocrine therapy with an aromatase inhibitor with a baseline bone density exam prior to initiation of AI.   All questions were answered. The patient knows to call the clinic with any problems, questions or concerns. We can certainly see the patient much sooner if necessary.  Patient and plan discussed with Dr. Ancil Linsey and she is in agreement with the aforementioned.   This note is electronically signed by: Robynn Pane 10/23/2014 11:52 AM

## 2014-10-22 NOTE — Assessment & Plan Note (Addendum)
Stage II invasive ductal carcinoma of left breast, ER/PR +, Her2 negative, S/P left lumpectomy with SLN biopsy on 08/20/2014 and starting systemic chemotherapy on 10/02/2014 with CMF because patient refused Taxane-containing regimen.  She is educated today that she will receive 6 cycles of CMF, followed by radiation, followed by endocrine therapy.  She is agreeable to this plan of treatment.  Starting cycle 2 of therapy today with excellent pre-chemotherapy labs. Elevated Hgb is noted and a Jak2 genotype was drawn today to evaluate for primary cause of polycythemia.  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  Disability forms have been completed this week and faxed appropriately.  She is given a hard copy of these completed papers.  Serosanguinous discharge from the lateral aspect of surgical site of left breast without any sign of infection and not in need of intervention.  Followed by Dr. Brantley Stage.  Return in 3 weeks for follow-up and call in the interim with any issues associated with treatment.

## 2014-10-23 ENCOUNTER — Encounter (HOSPITAL_COMMUNITY): Payer: Self-pay

## 2014-10-23 ENCOUNTER — Encounter (HOSPITAL_BASED_OUTPATIENT_CLINIC_OR_DEPARTMENT_OTHER): Payer: 59 | Admitting: Oncology

## 2014-10-23 ENCOUNTER — Other Ambulatory Visit (HOSPITAL_COMMUNITY): Payer: 59

## 2014-10-23 ENCOUNTER — Encounter (HOSPITAL_BASED_OUTPATIENT_CLINIC_OR_DEPARTMENT_OTHER): Payer: 59

## 2014-10-23 DIAGNOSIS — C50412 Malignant neoplasm of upper-outer quadrant of left female breast: Secondary | ICD-10-CM

## 2014-10-23 DIAGNOSIS — D582 Other hemoglobinopathies: Secondary | ICD-10-CM

## 2014-10-23 DIAGNOSIS — C773 Secondary and unspecified malignant neoplasm of axilla and upper limb lymph nodes: Secondary | ICD-10-CM

## 2014-10-23 DIAGNOSIS — Z5111 Encounter for antineoplastic chemotherapy: Secondary | ICD-10-CM | POA: Diagnosis not present

## 2014-10-23 DIAGNOSIS — E876 Hypokalemia: Secondary | ICD-10-CM

## 2014-10-23 DIAGNOSIS — Z170 Estrogen receptor positive status [ER+]: Secondary | ICD-10-CM

## 2014-10-23 LAB — CBC WITH DIFFERENTIAL/PLATELET
Basophils Absolute: 0 10*3/uL (ref 0.0–0.1)
Basophils Relative: 0 % (ref 0–1)
Eosinophils Absolute: 0.1 10*3/uL (ref 0.0–0.7)
Eosinophils Relative: 1 % (ref 0–5)
HCT: 48.2 % — ABNORMAL HIGH (ref 36.0–46.0)
Hemoglobin: 16.6 g/dL — ABNORMAL HIGH (ref 12.0–15.0)
Lymphocytes Relative: 20 % (ref 12–46)
Lymphs Abs: 1.9 10*3/uL (ref 0.7–4.0)
MCH: 30.6 pg (ref 26.0–34.0)
MCHC: 34.4 g/dL (ref 30.0–36.0)
MCV: 88.9 fL (ref 78.0–100.0)
Monocytes Absolute: 0.5 10*3/uL (ref 0.1–1.0)
Monocytes Relative: 6 % (ref 3–12)
Neutro Abs: 6.8 10*3/uL (ref 1.7–7.7)
Neutrophils Relative %: 73 % (ref 43–77)
Platelets: 222 10*3/uL (ref 150–400)
RBC: 5.42 MIL/uL — ABNORMAL HIGH (ref 3.87–5.11)
RDW: 13.5 % (ref 11.5–15.5)
WBC: 9.4 10*3/uL (ref 4.0–10.5)

## 2014-10-23 LAB — COMPREHENSIVE METABOLIC PANEL
ALT: 11 U/L (ref 0–35)
AST: 12 U/L (ref 0–37)
Albumin: 3.5 g/dL (ref 3.5–5.2)
Alkaline Phosphatase: 63 U/L (ref 39–117)
Anion gap: 6 (ref 5–15)
BUN: 21 mg/dL (ref 6–23)
CO2: 31 mmol/L (ref 19–32)
Calcium: 9.3 mg/dL (ref 8.4–10.5)
Chloride: 102 mmol/L (ref 96–112)
Creatinine, Ser: 0.74 mg/dL (ref 0.50–1.10)
GFR calc Af Amer: >90 mL/min (ref >90)
GFR calc non Af Amer: 90 mL/min — ABNORMAL LOW (ref >90)
Glucose, Bld: 97 mg/dL (ref 70–99)
Potassium: 3.1 mmol/L — ABNORMAL LOW (ref 3.5–5.1)
Sodium: 139 mmol/L (ref 135–145)
Total Bilirubin: 0.8 mg/dL (ref 0.3–1.2)
Total Protein: 6.5 g/dL (ref 6.0–8.3)

## 2014-10-23 MED ORDER — LORAZEPAM 2 MG/ML IJ SOLN
0.5000 mg | Freq: Once | INTRAMUSCULAR | Status: AC
  Administered 2014-10-23: 0.5 mg via INTRAVENOUS
  Filled 2014-10-23: qty 1

## 2014-10-23 MED ORDER — METHOTREXATE SODIUM CHEMO INJECTION 25 MG/ML
40.0000 mg/m2 | Freq: Once | INTRAMUSCULAR | Status: AC
  Administered 2014-10-23: 85 mg via INTRAVENOUS
  Filled 2014-10-23: qty 3.4

## 2014-10-23 MED ORDER — SODIUM CHLORIDE 0.9 % IV SOLN
600.0000 mg/m2 | Freq: Once | INTRAVENOUS | Status: AC
  Administered 2014-10-23: 1260 mg via INTRAVENOUS
  Filled 2014-10-23: qty 50

## 2014-10-23 MED ORDER — FLUOROURACIL CHEMO INJECTION 2.5 GM/50ML
600.0000 mg/m2 | Freq: Once | INTRAVENOUS | Status: AC
  Administered 2014-10-23: 1250 mg via INTRAVENOUS
  Filled 2014-10-23: qty 25

## 2014-10-23 MED ORDER — SODIUM CHLORIDE 0.9 % IV SOLN
Freq: Once | INTRAVENOUS | Status: AC
  Administered 2014-10-23: 09:00:00 via INTRAVENOUS

## 2014-10-23 MED ORDER — HEPARIN SOD (PORK) LOCK FLUSH 100 UNIT/ML IV SOLN
500.0000 [IU] | Freq: Once | INTRAVENOUS | Status: AC
  Administered 2014-10-23: 500 [IU] via INTRAVENOUS
  Filled 2014-10-23: qty 5

## 2014-10-23 MED ORDER — SODIUM CHLORIDE 0.9 % IJ SOLN: 10.0000 mL | INTRAMUSCULAR | Status: DC | PRN

## 2014-10-23 MED ORDER — DEXAMETHASONE SODIUM PHOSPHATE 10 MG/ML IJ SOLN: 10.0000 mg | Freq: Once | INTRAMUSCULAR | Status: DC

## 2014-10-23 MED ORDER — SODIUM CHLORIDE 0.9 % IV SOLN
Freq: Once | INTRAVENOUS | Status: AC
  Administered 2014-10-23: 12:00:00 via INTRAVENOUS
  Filled 2014-10-23: qty 5

## 2014-10-23 MED ORDER — SODIUM CHLORIDE 0.9 % IV SOLN: 8.0000 mg | Freq: Once | INTRAVENOUS | Status: DC

## 2014-10-23 MED ORDER — SODIUM CHLORIDE 0.9 % IV SOLN: 150.0000 mg | Freq: Once | INTRAVENOUS | Status: DC

## 2014-10-23 MED ORDER — SODIUM CHLORIDE 0.9 % IV SOLN
Freq: Once | INTRAVENOUS | Status: AC
  Administered 2014-10-23: 8 mg via INTRAVENOUS
  Filled 2014-10-23: qty 4

## 2014-10-23 MED ORDER — PALONOSETRON HCL INJECTION 0.25 MG/5ML
0.2500 mg | Freq: Once | INTRAVENOUS | Status: AC
  Administered 2014-10-23: 0.25 mg via INTRAVENOUS
  Filled 2014-10-23: qty 5

## 2014-10-23 MED ORDER — POTASSIUM CHLORIDE CRYS ER 20 MEQ PO TBCR: 20.0000 meq | EXTENDED_RELEASE_TABLET | Freq: Two times a day (BID) | ORAL | Status: DC

## 2014-10-23 NOTE — Patient Instructions (Signed)
..  [facility] Discharge Instructions for Patients Receiving Chemotherapy  Today you received the following chemotherapy agents cytoxan, 11fu and methotrexate  To help prevent nausea and vomiting after your treatment, we encourage you to take your nausea medication  Take your stool softeners 3-4 daily as needed   If you develop nausea and vomiting, or diarrhea that is not controlled by your medication, call the clinic.  The clinic phone number is [phone]. Office hours are Monday-Friday 8:30am-5:00pm.  BELOW ARE SYMPTOMS THAT SHOULD BE REPORTED IMMEDIATELY:  *FEVER GREATER THAN 101.0 F  *CHILLS WITH OR WITHOUT FEVER  NAUSEA AND VOMITING THAT IS NOT CONTROLLED WITH YOUR NAUSEA MEDICATION  *UNUSUAL SHORTNESS OF BREATH  *UNUSUAL BRUISING OR BLEEDING  TENDERNESS IN MOUTH AND THROAT WITH OR WITHOUT PRESENCE OF ULCERS  *URINARY PROBLEMS  *BOWEL PROBLEMS  UNUSUAL RASH Items with * indicate a potential emergency and should be followed up as soon as possible. If you have an emergency after office hours please contact your primary care physician or go to the nearest emergency department.  Please call the clinic during office hours if you have any questions or concerns.   You may also contact the Patient Navigator at [phone] should you have any questions or need assistance in obtaining follow up care. _____________________________________________________________________ Have you asked about our STAR program?    STAR stands for Survivorship Training and Rehabilitation, and this is a nationally recognized cancer care program that focuses on survivorship and rehabilitation.  Cancer and cancer treatments may cause problems, such as, pain, making you feel tired and keeping you from doing the things that you need or want to do. Cancer rehabilitation can help. Our goal is to reduce these troubling effects and help you have the best quality of life possible.  You may  receive a survey from a nurse that asks questions about your current state of health.  Based on the survey results, all eligible patients will be referred to the [facility] program for an evaluation so we can better serve you! A frequently asked questions sheet is available upon request.

## 2014-10-23 NOTE — Progress Notes (Signed)
Tolerated chemo well. 

## 2014-10-23 NOTE — Patient Instructions (Signed)
[place] at [facility] Discharge Instructions  RECOMMENDATIONS MADE BY THE CONSULTANT AND ANY TEST RESULTS WILL BE SENT TO YOUR REFERRING PHYSICIAN.  Exam and discussion by Robynn Pane, PA-C. Will continue with current therapy with 4 - 6 treatments. Radiation and endocrine therapy to follow chemotherapy.  Will be discussed in more detail on next visit. Your potassium level is low today and will prescribe Potassium 20 mEq and you will take 1 twice daily. Report fevers, uncontrolled nausea, vomiting, or other concerns. Take your nausea medication as directed. Neulasta injection tomorrow as scheduled. Follow-up in 3 weeks.   Thank you for choosing [place] at [facility] to provide your oncology and hematology care.  To afford each patient quality time with our provider, please arrive at least 15 minutes before your scheduled appointment time.    You need to re-schedule your appointment should you arrive 10 or more minutes late.  We strive to give you quality time with our providers, and arriving late affects you and other patients whose appointments are after yours.  Also, if you no show three or more times for appointments you may be dismissed from the clinic at the providers discretion.     Again, thank you for choosing [facility].  Our hope is that these requests will decrease the amount of time that you wait before being seen by our physicians.       _____________________________________________________________  Should you have questions after your visit to [facility], please contact our office at [phone] between the hours of 8:30 a.m. and 4:30 p.m.  Voicemails left after 4:30 p.m. will not be returned until the following business day.  For prescription refill requests, have your pharmacy contact our office.

## 2014-10-24 ENCOUNTER — Encounter (HOSPITAL_COMMUNITY): Payer: 59 | Attending: Oncology

## 2014-10-24 ENCOUNTER — Encounter (HOSPITAL_COMMUNITY): Payer: Self-pay

## 2014-10-24 DIAGNOSIS — Z5189 Encounter for other specified aftercare: Secondary | ICD-10-CM | POA: Diagnosis not present

## 2014-10-24 DIAGNOSIS — C50412 Malignant neoplasm of upper-outer quadrant of left female breast: Secondary | ICD-10-CM | POA: Insufficient documentation

## 2014-10-24 DIAGNOSIS — R319 Hematuria, unspecified: Secondary | ICD-10-CM | POA: Insufficient documentation

## 2014-10-24 DIAGNOSIS — E876 Hypokalemia: Secondary | ICD-10-CM | POA: Insufficient documentation

## 2014-10-24 MED ORDER — PEGFILGRASTIM INJECTION 6 MG/0.6ML ~~LOC~~
6.0000 mg | PREFILLED_SYRINGE | Freq: Once | SUBCUTANEOUS | Status: AC
  Administered 2014-10-24: 6 mg via SUBCUTANEOUS

## 2014-10-24 MED ORDER — PEGFILGRASTIM INJECTION 6 MG/0.6ML ~~LOC~~
PREFILLED_SYRINGE | SUBCUTANEOUS | Status: AC
  Filled 2014-10-24: qty 0.6

## 2014-10-24 NOTE — Patient Instructions (Signed)
[place] at [facility] Discharge Instructions  RECOMMENDATIONS MADE BY THE CONSULTANT AND ANY TEST RESULTS WILL BE SENT TO YOUR REFERRING PHYSICIAN.  You received your neulasta shot today. Call with any concerns or questions. See you at your next appt.  Thank you for choosing [place] at [facility] to provide your oncology and hematology care.  To afford each patient quality time with our provider, please arrive at least 15 minutes before your scheduled appointment time.    You need to re-schedule your appointment should you arrive 10 or more minutes late.  We strive to give you quality time with our providers, and arriving late affects you and other patients whose appointments are after yours.  Also, if you no show three or more times for appointments you may be dismissed from the clinic at the providers discretion.     Again, thank you for choosing [facility].  Our hope is that these requests will decrease the amount of time that you wait before being seen by our physicians.       _____________________________________________________________  Should you have questions after your visit to [facility], please contact our office at [phone] between the hours of 8:30 a.m. and 4:30 p.m.  Voicemails left after 4:30 p.m. will not be returned until the following business day.  For prescription refill requests, have your pharmacy contact our office.

## 2014-10-24 NOTE — Progress Notes (Signed)
Regina Mcmahon presents today for injection per MD orders. Neulasta 6mg  administered SQ in left Abdomen. Administration without incident. Patient tolerated well.

## 2014-10-27 LAB — JAK2 GENOTYPR

## 2014-10-30 ENCOUNTER — Other Ambulatory Visit (HOSPITAL_COMMUNITY): Payer: Self-pay

## 2014-10-30 ENCOUNTER — Telehealth (HOSPITAL_COMMUNITY): Payer: Self-pay

## 2014-10-30 MED ORDER — AZITHROMYCIN 250 MG PO TABS: ORAL_TABLET | ORAL | Status: DC

## 2014-10-30 NOTE — Telephone Encounter (Signed)
Call from patient with complaints of cough, sore throat, head and chest congestion.  Has runny nose, watery eyes and is coughing up greenish mucus x 1 day.  Not running any fever.  Has been taking ibuprofen and took Claritin yesterday.

## 2014-10-30 NOTE — Telephone Encounter (Signed)
-----   Message from Louis Meckel sent at 10/30/2014  9:02 AM EDT ----- Regarding: question Patient called and said she thinks she is getting a cold and thinks it may be going into her chest.  She has questions about it.  Call her at [phone]

## 2014-10-30 NOTE — Telephone Encounter (Signed)
Patient notified that per Dr. Whitney Muse a Zpak was ordered for her cough/congestion.

## 2014-11-12 ENCOUNTER — Encounter (HOSPITAL_BASED_OUTPATIENT_CLINIC_OR_DEPARTMENT_OTHER): Payer: 59 | Admitting: Hematology & Oncology

## 2014-11-12 ENCOUNTER — Encounter (HOSPITAL_COMMUNITY): Payer: Self-pay | Admitting: Hematology & Oncology

## 2014-11-12 ENCOUNTER — Ambulatory Visit (HOSPITAL_COMMUNITY): Payer: 59 | Admitting: Hematology & Oncology

## 2014-11-12 ENCOUNTER — Encounter (HOSPITAL_BASED_OUTPATIENT_CLINIC_OR_DEPARTMENT_OTHER): Payer: 59

## 2014-11-12 VITALS — BP 137/78 | HR 76 | Temp 97.9°F | Resp 18 | Wt 227.4 lb

## 2014-11-12 DIAGNOSIS — G4700 Insomnia, unspecified: Secondary | ICD-10-CM

## 2014-11-12 DIAGNOSIS — F419 Anxiety disorder, unspecified: Secondary | ICD-10-CM

## 2014-11-12 DIAGNOSIS — Z5111 Encounter for antineoplastic chemotherapy: Secondary | ICD-10-CM | POA: Diagnosis not present

## 2014-11-12 DIAGNOSIS — C50412 Malignant neoplasm of upper-outer quadrant of left female breast: Secondary | ICD-10-CM

## 2014-11-12 DIAGNOSIS — R319 Hematuria, unspecified: Secondary | ICD-10-CM | POA: Diagnosis present

## 2014-11-12 DIAGNOSIS — M79606 Pain in leg, unspecified: Secondary | ICD-10-CM

## 2014-11-12 DIAGNOSIS — E876 Hypokalemia: Secondary | ICD-10-CM | POA: Diagnosis present

## 2014-11-12 DIAGNOSIS — Z720 Tobacco use: Secondary | ICD-10-CM

## 2014-11-12 DIAGNOSIS — C50912 Malignant neoplasm of unspecified site of left female breast: Secondary | ICD-10-CM

## 2014-11-12 DIAGNOSIS — M25559 Pain in unspecified hip: Secondary | ICD-10-CM | POA: Diagnosis not present

## 2014-11-12 LAB — CBC WITH DIFFERENTIAL/PLATELET
Basophils Absolute: 0.1 10*3/uL (ref 0.0–0.1)
Basophils Relative: 1 % (ref 0–1)
Eosinophils Absolute: 0.3 10*3/uL (ref 0.0–0.7)
Eosinophils Relative: 3 % (ref 0–5)
HCT: 49 % — ABNORMAL HIGH (ref 36.0–46.0)
Hemoglobin: 16.7 g/dL — ABNORMAL HIGH (ref 12.0–15.0)
Lymphocytes Relative: 22 % (ref 12–46)
Lymphs Abs: 2.1 10*3/uL (ref 0.7–4.0)
MCH: 31.2 pg (ref 26.0–34.0)
MCHC: 34.1 g/dL (ref 30.0–36.0)
MCV: 91.6 fL (ref 78.0–100.0)
Monocytes Absolute: 0.7 10*3/uL (ref 0.1–1.0)
Monocytes Relative: 7 % (ref 3–12)
Neutro Abs: 6.3 10*3/uL (ref 1.7–7.7)
Neutrophils Relative %: 67 % (ref 43–77)
Platelets: 151 10*3/uL (ref 150–400)
RBC: 5.35 MIL/uL — ABNORMAL HIGH (ref 3.87–5.11)
RDW: 14.4 % (ref 11.5–15.5)
WBC: 9.4 10*3/uL (ref 4.0–10.5)

## 2014-11-12 LAB — COMPREHENSIVE METABOLIC PANEL
ALT: 13 U/L (ref 0–35)
AST: 16 U/L (ref 0–37)
Albumin: 3.8 g/dL (ref 3.5–5.2)
Alkaline Phosphatase: 77 U/L (ref 39–117)
Anion gap: 7 (ref 5–15)
BUN: 21 mg/dL (ref 6–23)
CO2: 29 mmol/L (ref 19–32)
Calcium: 9.2 mg/dL (ref 8.4–10.5)
Chloride: 103 mmol/L (ref 96–112)
Creatinine, Ser: 0.72 mg/dL (ref 0.50–1.10)
GFR calc Af Amer: >90 mL/min (ref >90)
GFR calc non Af Amer: >90 mL/min (ref >90)
Glucose, Bld: 114 mg/dL — ABNORMAL HIGH (ref 70–99)
Potassium: 3.3 mmol/L — ABNORMAL LOW (ref 3.5–5.1)
Sodium: 139 mmol/L (ref 135–145)
Total Bilirubin: 1.2 mg/dL (ref 0.3–1.2)
Total Protein: 6.7 g/dL (ref 6.0–8.3)

## 2014-11-12 MED ORDER — PALONOSETRON HCL INJECTION 0.25 MG/5ML
INTRAVENOUS | Status: AC
  Filled 2014-11-12: qty 5

## 2014-11-12 MED ORDER — SODIUM CHLORIDE 0.9 % IV SOLN
600.0000 mg/m2 | Freq: Once | INTRAVENOUS | Status: AC
  Administered 2014-11-12: 1260 mg via INTRAVENOUS
  Filled 2014-11-12: qty 50

## 2014-11-12 MED ORDER — SODIUM CHLORIDE 0.9 % IV SOLN: 8.0000 mg | Freq: Once | INTRAVENOUS | Status: DC

## 2014-11-12 MED ORDER — SODIUM CHLORIDE 0.9 % IV SOLN
Freq: Once | INTRAVENOUS | Status: AC
  Administered 2014-11-12: 11:00:00 via INTRAVENOUS
  Filled 2014-11-12: qty 5

## 2014-11-12 MED ORDER — LORAZEPAM 2 MG/ML IJ SOLN
INTRAMUSCULAR | Status: AC
  Filled 2014-11-12: qty 1

## 2014-11-12 MED ORDER — DEXAMETHASONE SODIUM PHOSPHATE 10 MG/ML IJ SOLN: 10.0000 mg | Freq: Once | INTRAMUSCULAR | Status: DC

## 2014-11-12 MED ORDER — OXYCODONE-ACETAMINOPHEN 5-325 MG PO TABS: 1.0000 | ORAL_TABLET | ORAL | Status: DC | PRN

## 2014-11-12 MED ORDER — ONDANSETRON HCL 40 MG/20ML IJ SOLN
Freq: Once | INTRAMUSCULAR | Status: AC
  Administered 2014-11-12: 8 mg via INTRAVENOUS
  Filled 2014-11-12: qty 4

## 2014-11-12 MED ORDER — SODIUM CHLORIDE 0.9 % IJ SOLN: 10.0000 mL | INTRAMUSCULAR | Status: DC | PRN

## 2014-11-12 MED ORDER — HEPARIN SOD (PORK) LOCK FLUSH 100 UNIT/ML IV SOLN
500.0000 [IU] | Freq: Once | INTRAVENOUS | Status: AC | PRN
Start: 2014-11-12 — End: 2014-11-12
  Administered 2014-11-12: 500 [IU]

## 2014-11-12 MED ORDER — FLUOROURACIL CHEMO INJECTION 2.5 GM/50ML
600.0000 mg/m2 | Freq: Once | INTRAVENOUS | Status: AC
  Administered 2014-11-12: 1250 mg via INTRAVENOUS
  Filled 2014-11-12: qty 25

## 2014-11-12 MED ORDER — METHOTREXATE SODIUM (PF) CHEMO INJECTION 250 MG/10ML
85.0000 mg | Freq: Once | INTRAMUSCULAR | Status: AC
  Administered 2014-11-12: 85 mg via INTRAVENOUS
  Filled 2014-11-12: qty 3.4

## 2014-11-12 MED ORDER — ESZOPICLONE 3 MG PO TABS: 3.0000 mg | ORAL_TABLET | Freq: Every day | ORAL | Status: DC

## 2014-11-12 MED ORDER — PALONOSETRON HCL INJECTION 0.25 MG/5ML
0.2500 mg | Freq: Once | INTRAVENOUS | Status: AC
  Administered 2014-11-12: 0.25 mg via INTRAVENOUS

## 2014-11-12 MED ORDER — SODIUM CHLORIDE 0.9 % IV SOLN
Freq: Once | INTRAVENOUS | Status: AC
  Administered 2014-11-12: 09:00:00 via INTRAVENOUS

## 2014-11-12 MED ORDER — LORAZEPAM 2 MG/ML IJ SOLN
0.5000 mg | Freq: Once | INTRAMUSCULAR | Status: AC
  Administered 2014-11-12: 0.5 mg via INTRAVENOUS

## 2014-11-12 MED ORDER — SODIUM CHLORIDE 0.9 % IV SOLN: 150.0000 mg | Freq: Once | INTRAVENOUS | Status: DC

## 2014-11-12 NOTE — Progress Notes (Signed)
[place] Progress Note  Patient Care Team: Sinda Du, MD as PCP - General (Internal Medicine)  CHIEF COMPLAINTS/PURPOSE OF CONSULTATION:  L breast cancer History of L breast core biopsy for microcalcifications in 2014, fibroadenoma. CMF cycle 1 on 10/02/2014 No matching staging information was found for the patient.  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 This has imaging features suspicious for malignancy.   07/01/2014 Initial Biopsy ER+ 100%, PR+ 91%, Ki-67 32% Invasive mammary carcinoma with lobular features   08/20/2014 Surgery Left breast seed localized lumpectomy and left axilla sentinel lymph node biopsy. Final Staging TNM: pT2, pN1a, pMX.   09/17/2014 Surgery port-a cath placement and re-excision   10/02/2014 -  Chemotherapy CMF.  patient refused Taxane-containing chemotherapy.    HISTORY OF PRESENTING ILLNESS:  Regina Mcmahon 64 y.o. 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 She is due for cycle #2 of CMF. She has done remarkably well with treatment. Her only complaint today is poor sleep. Like to try something to help her fall sleep easier at night.  She is trying to obtain disability secondary to multiple issues including her breathing, leg and hip pain. Her job requires a lot of standing, lifting, and walking.   MEDICAL HISTORY:  Past Medical History  Diagnosis Date  . Hypertension   . Panic attacks   . Chronic airway obstruction   . Pain     Hx.pain in joint involving pelvic region and thigh; pain in limb  . Sinusitis   . Asthma    . Palpitations   . Hemorrhage rectum     and anus.  . Symptomatic menopausal or female climacteric states   . Asymptomatic varicose veins   . Other symptoms involving digestive system(787.99)   . Depression   . Breast cancer 06/2014    left  . Insomnia     SURGICAL HISTORY: Past Surgical History  Procedure Laterality Date  . Cesarean section    . Cholecystectomy  1985  . Tubal ligation    . Colonoscopy    . Radioactive seed guided mastectomy with axillary sentinel lymph node biopsy Left 08/20/2014    Procedure: LEFT BREAST LUMPECTOMY WITH RADIOACTIVE SEED LOCALIZATION AND SENTINEL LYMPH NODE MAPPING;  Surgeon: Erroll Luna, MD;  Location: [facility];  Service: General;  Laterality: Left;  . Portacath placement Right 09/17/2014    Procedure: INSERTION PORT-A-CATH WITH ULTRASOUND;  Surgeon: Erroll Luna, MD;  Location: [facility];  Service: General;  Laterality: Right;  IJ  . Re-excision of breast lumpectomy Left 09/17/2014    Procedure: RE-EXCISION OF BREAST LUMPECTOMY;  Surgeon: Erroll Luna, MD;  Location: [facility];  Service: General;  Laterality: Left;    SOCIAL HISTORY: History   Social History  . Marital Status: Divorced    Spouse Name: N/A  . Number of Children: 2  . Years of Education: N/A   Occupational History  . Not on file.   Social History Main Topics  . Smoking status: Current Every Day Smoker -- 0.50 packs/day  Types: Cigarettes  . Smokeless tobacco: Never Used  . Alcohol Use: No  . Drug Use: No  . Sexual Activity: No   Other Topics Concern  . Not on file   Social History Narrative    FAMILY HISTORY: Family History  Problem Relation Age of Onset  . Cancer Mother     ovarian; deceased age 59  . Diabetes Mother   . Other Father     died in MVA at age 64  . Other Sister     mitral valve disorder  . Cancer Brother     colon; deceased age 81  . Cancer Paternal Aunt     bone or blood    indicated that her mother is deceased. She indicated that her father is deceased. She indicated that her brother is deceased. She indicated that her paternal aunt is deceased.   ALLERGIES:  has No Known Allergies.  MEDICATIONS:  Current Outpatient Prescriptions  Medication Sig Dispense Refill  . acetaminophen (TYLENOL) 325 MG tablet Take 650 mg by mouth every 6 (six) hours as needed.    Marland Kitchen albuterol (PROVENTIL HFA;VENTOLIN HFA) 108 (90 BASE) MCG/ACT inhaler Inhale 2 puffs into the lungs every 6 (six) hours as needed for wheezing or shortness of breath.    . ALPRAZolam (XANAX) 0.5 MG tablet Take 0.5 mg by mouth 3 (three) times daily as needed for anxiety.    . calcium carbonate (TUMS - DOSED IN MG ELEMENTAL CALCIUM) 500 MG chewable tablet Chew 1 tablet by mouth 3 (three) times daily with meals.    . Cyclophosphamide (CYTOXAN IJ) Inject as directed. Every 21 days starting 10/02/14    . diphenhydramine-acetaminophen (TYLENOL PM) 25-500 MG TABS Take 2 tablets by mouth at bedtime.    . docusate sodium (COLACE) 100 MG capsule Take 100 mg by mouth 3 (three) times daily as needed for mild constipation.    . fluorouracil in sodium chloride 0.9 % 50 mL Inject into the vein once. Every 21 days starting 10/02/14    . hydrochlorothiazide (MICROZIDE) 12.5 MG capsule Take 12.5 mg by mouth daily.    Marland Kitchen ibuprofen (ADVIL,MOTRIN) 200 MG tablet Take 200 mg by mouth every 6 (six) hours as needed.    . lidocaine-prilocaine (EMLA) cream Apply a quarter size amount to port site 1 hour prior to chemo. Do not rub in. Cover with plastic wrap. 30 g 3  . METHOTREXATE SODIUM IJ Inject as directed. Every 21 days starting 10/02/14    . omeprazole (PRILOSEC) 40 MG capsule Take 1 capsule (40 mg total) by mouth daily. 30 capsule 4  . ondansetron (ZOFRAN) 8 MG tablet Take 1 tablet every 8 hours as needed for nausea/vomiting. 30 tablet 2  . potassium chloride SA (K-DUR,KLOR-CON) 20 MEQ tablet Take 1 tablet (20 mEq total) by mouth 2  (two) times daily. 60 tablet 1  . azithromycin (ZITHROMAX) 250 MG tablet Take 1 daily (Patient not taking: Reported on 11/12/2014) 6 each 0  . Eszopiclone 3 MG TABS Take 1 tablet (3 mg total) by mouth at bedtime. Take immediately before bedtime 30 tablet 3  . oxyCODONE-acetaminophen (ROXICET) 5-325 MG per tablet Take 1-2 tablets by mouth every 4 (four) hours as needed. (Patient not taking: Reported on 11/12/2014) 30 tablet 0  . oxyCODONE-acetaminophen (ROXICET) 5-325 MG per tablet Take 1 tablet by mouth every 4 (four) hours as needed. 30 tablet 0  . prochlorperazine (COMPAZINE) 10 MG tablet Take 1 tablet (10 mg total) by mouth every 6 (six) hours  as needed (Nausea or vomiting). (Patient not taking: Reported on 10/23/2014) 30 tablet 1  . tiZANidine (ZANAFLEX) 4 MG tablet      No current facility-administered medications for this visit.   Facility-Administered Medications Ordered in Other Visits  Medication Dose Route Frequency Provider Last Rate Last Dose  . sodium chloride 0.9 % injection 10 mL  10 mL Intracatheter PRN Patrici Ranks, MD        Review of Systems  Constitutional: Negative for fever, chills, weight loss and malaise/fatigue.  HENT: Negative for congestion, hearing loss, nosebleeds, sore throat and tinnitus.   Eyes: Negative for blurred vision, double vision, pain and discharge.  Respiratory: Negative for cough, hemoptysis, sputum production, shortness of breath and wheezing.   Cardiovascular: Negative for chest pain, palpitations, claudication, leg swelling and PND.  Gastrointestinal: Negative for heartburn, nausea, vomiting, abdominal pain, diarrhea, constipation, blood in stool and melena.  Genitourinary: Negative for dysuria, urgency, frequency and hematuria.  Musculoskeletal: Negative for myalgias, joint pain and falls.  Skin: Negative for itching and rash.  Neurological: Negative for dizziness, tingling, tremors, sensory change, speech change, focal weakness, seizures,  loss of consciousness, weakness and headaches.  Endo/Heme/Allergies: Does not bruise/bleed easily.  Psychiatric/Behavioral: Negative for depression, suicidal ideas, memory loss and substance abuse. The patient is not nervous/anxious and does not have insomnia.     PHYSICAL EXAMINATION: ECOG PERFORMANCE STATUS: 0 - Asymptomatic  Filed Vitals:   11/12/14 0820  BP: 137/78  Pulse: 76  Temp: 97.9 F (36.6 C)  Resp: 18   Filed Weights   11/12/14 0820  Weight: 227 lb 6.4 oz (103.148 kg)     Physical Exam  Constitutional: She is oriented to person, place, and time and well-developed, well-nourished, and in no distress.  HENT:  Head: Normocephalic and atraumatic.  Nose: Nose normal.  Mouth/Throat: Oropharynx is clear and moist. No oropharyngeal exudate.  Eyes: Conjunctivae and EOM are normal. Pupils are equal, round, and reactive to light. Right eye exhibits no discharge. Left eye exhibits no discharge. No scleral icterus.  Neck: Normal range of motion. Neck supple. No tracheal deviation present. No thyromegaly present.  Cardiovascular: Normal rate, regular rhythm and normal heart sounds.  Exam reveals no gallop and no friction rub.   No murmur heard. Pulmonary/Chest: Effort normal and breath sounds normal. She has no wheezes. She has no rales.  Abdominal: Soft. Bowel sounds are normal. She exhibits no distension and no mass. There is no tenderness. There is no rebound and no guarding.  Musculoskeletal: Normal range of motion. She exhibits no edema.  Lymphadenopathy:    She has no cervical adenopathy.  Neurological: She is alert and oriented to person, place, and time. She has normal reflexes. No cranial nerve deficit. Gait normal. Coordination normal.  Skin: Skin is warm and dry. No rash noted.  Psychiatric: Mood, memory, affect and judgment normal.  Nursing note and vitals reviewed.   LABORATORY DATA:  I have reviewed the data as listed Lab Results  Component Value Date   WBC  9.4 11/12/2014   HGB 16.7* 11/12/2014   HCT 49.0* 11/12/2014   MCV 91.6 11/12/2014   PLT 151 11/12/2014     Chemistry      Component Value Date/Time   NA 139 11/12/2014 0830   K 3.3* 11/12/2014 0830   CL 103 11/12/2014 0830   CO2 29 11/12/2014 0830   BUN 21 11/12/2014 0830   CREATININE 0.72 11/12/2014 0830      Component Value Date/Time  CALCIUM 9.2 11/12/2014 0830   ALKPHOS 77 11/12/2014 0830   AST 16 11/12/2014 0830   ALT 13 11/12/2014 0830   BILITOT 1.2 11/12/2014 0830      ASSESSMENT & PLAN:   Stage II, infiltrating ductal carcinoma of the left breast, ER positive, PR positive, HER-2 negative Anxiety Tobacco abuse Insomnia, difficulty falling asleep   She will proceed with cycle #2 of CMF today. She will return in 3 weeks with an office visit, laboratory studies and plan for cycle #3. She has done quite well with treatment and nose side effects and symptoms of concern.   We will try her on Lunesta 3 mg at night to see if this helps her fall asleep. I advised her to call prior to follow-up if she has no difficulties with this medication.  We again addressed the importance of smoking cessation. I gave her several options to help her discontinue smoking. She would like to work on this by herself first. This note was signed electronically   Molli Hazard, MD 11/12/2014 2:54 PM

## 2014-11-12 NOTE — Patient Instructions (Signed)
[facility] Discharge Instructions for Patients Receiving Chemotherapy  Today you received the following chemotherapy agents:  Cytoxan, methotrexate, and fluoruracil Prescriptions for Lunesta to help you sleep; refill on oxycodone for pain control. Return as scheduled in 3 weeks for chemotherapy and office visit.   If you develop nausea and vomiting, or diarrhea that is not controlled by your medication, call the clinic.  The clinic phone number is [phone]. Office hours are Monday-Friday 8:30am-5:00pm.  BELOW ARE SYMPTOMS THAT SHOULD BE REPORTED IMMEDIATELY:  *FEVER GREATER THAN 101.0 F  *CHILLS WITH OR WITHOUT FEVER  NAUSEA AND VOMITING THAT IS NOT CONTROLLED WITH YOUR NAUSEA MEDICATION  *UNUSUAL SHORTNESS OF BREATH  *UNUSUAL BRUISING OR BLEEDING  TENDERNESS IN MOUTH AND THROAT WITH OR WITHOUT PRESENCE OF ULCERS  *URINARY PROBLEMS  *BOWEL PROBLEMS  UNUSUAL RASH Items with * indicate a potential emergency and should be followed up as soon as possible. If you have an emergency after office hours please contact your primary care physician or go to the nearest emergency department.  Please call the clinic during office hours if you have any questions or concerns.   You may also contact the Patient Navigator at [phone] should you have any questions or need assistance in obtaining follow up care. _____________________________________________________________________ Have you asked about our STAR program?    STAR stands for Survivorship Training and Rehabilitation, and this is a nationally recognized cancer care program that focuses on survivorship and rehabilitation.  Cancer and cancer treatments may cause problems, such as, pain, making you feel tired and keeping you from doing the things that you need or want to do. Cancer rehabilitation can help. Our goal is to reduce these troubling effects and help you have the best quality of life possible.  You may  receive a survey from a nurse that asks questions about your current state of health.  Based on the survey results, all eligible patients will be referred to the [facility] program for an evaluation so we can better serve you! A frequently asked questions sheet is available upon request.

## 2014-11-12 NOTE — Progress Notes (Signed)
Tolerated treatment well with no adverse reactions.  A&Ox4; in no distress.  Left in c/o sister for transport home - discharged ambulatory (pt declined wheelchair).

## 2014-11-12 NOTE — Patient Instructions (Signed)
..  [facility] at [facility] Discharge Instructions  RECOMMENDATIONS MADE BY THE CONSULTANT AND ANY TEST RESULTS WILL BE SENT TO YOUR REFERRING PHYSICIAN.  Exam today per Dr. Penland  Thank you for choosing  [facility] at [facility] to provide your oncology and hematology care.  To afford each patient quality time with our provider, please arrive at least 15 minutes before your scheduled appointment time.    You need to re-schedule your appointment should you arrive 10 or more minutes late.  We strive to give you quality time with our providers, and arriving late affects you and other patients whose appointments are after yours.  Also, if you no show three or more times for appointments you may be dismissed from the clinic at the providers discretion.     Again, thank you for choosing [facility].  Our hope is that these requests will decrease the amount of time that you wait before being seen by our physicians.       _____________________________________________________________  Should you have questions after your visit to  Cancer Center, please contact our office at [phone] between the hours of 8:30 a.m. and 4:30 p.m.  Voicemails left after 4:30 p.m. will not be returned until the following business day.  For prescription refill requests, have your pharmacy contact our office.    

## 2014-11-13 ENCOUNTER — Inpatient Hospital Stay (HOSPITAL_COMMUNITY): Payer: 59

## 2014-11-13 ENCOUNTER — Encounter (HOSPITAL_BASED_OUTPATIENT_CLINIC_OR_DEPARTMENT_OTHER): Payer: 59

## 2014-11-13 ENCOUNTER — Encounter (HOSPITAL_COMMUNITY): Payer: Self-pay

## 2014-11-13 VITALS — BP 125/65 | HR 78 | Temp 97.9°F | Resp 20

## 2014-11-13 DIAGNOSIS — C50412 Malignant neoplasm of upper-outer quadrant of left female breast: Secondary | ICD-10-CM | POA: Diagnosis not present

## 2014-11-13 DIAGNOSIS — Z5189 Encounter for other specified aftercare: Secondary | ICD-10-CM

## 2014-11-13 MED ORDER — PEGFILGRASTIM INJECTION 6 MG/0.6ML ~~LOC~~
6.0000 mg | PREFILLED_SYRINGE | Freq: Once | SUBCUTANEOUS | Status: AC
Start: 2014-11-13 — End: 2014-11-13
  Administered 2014-11-13: 6 mg via SUBCUTANEOUS

## 2014-11-13 NOTE — Progress Notes (Signed)
Regina Mcmahon's reason for visit today is for an injection  as scheduled per MD orders. Regina Mcmahon also received neulasta 6 mg in abdomen sq per MD orders; see [facility] for administration details.  Regina Mcmahon tolerated all procedures well and without incident; questions were answered and patient was discharged.

## 2014-11-13 NOTE — Patient Instructions (Addendum)
     [place] at [facility]  Discharge Instructions:  You had a neulasta injection.  Please call the clinic if you have any questions or concerns.  Follow up as scheduled. _______________________________________________________________  Thank you for choosing [place] at [facility] to provide your oncology and hematology care.  To afford each patient quality time with our providers, please arrive at least 15 minutes before your scheduled appointment.  You need to re-schedule your appointment if you arrive 10 or more minutes late.  We strive to give you quality time with our providers, and arriving late affects you and other patients whose appointments are after yours.  Also, if you no show three or more times for appointments you may be dismissed from the clinic.  Again, thank you for choosing [place] at [place] hope is that these requests will allow you access to exceptional care and in a timely manner. _______________________________________________________________  If you have questions after your visit, please contact our office at [phone] between the hours of 8:30 a.m. and 5:00 p.m. Voicemails left after 4:30 p.m. will not be returned until the following business day. _______________________________________________________________  For prescription refill requests, have your pharmacy contact our office. _______________________________________________________________  Recommendations made by the consultant and any test results will be sent to your referring physician. _______________________________________________________________17-Ketosteroids This is a 24-hour urine test in which the breakdown products of the sex hormones are measured. 17-ketosteroids form when the body breaks down female sex hormones and other hormones released by the adrenal cortex. This test is used to help diagnose adrenal  problems, including adrenal tumors, and adrenal enlargement (hyperplasia). PREPARATION FOR TEST Medications that interfere with the test should be held for several days prior to testing. Ask your caregiver which medications should be withheld and for how long. NORMAL FINDINGS  Female: 6-20 mg/24 hr or 20-70 micromole/day (SI units).  Female: 6-17 mg/24 hr or 20-60 micromole/day (SI units).  Elderly: values decrease with age.  Child under 12 years: less than 5 mg/24 hr.  Child 12-15 years: 5-12 mg/24 hr. Ranges for normal findings may vary among different laboratories and hospitals. You should always check with your doctor after having lab work or other tests done to discuss the meaning of your test results and whether your values are considered within normal limits. MEANING OF TEST  Your caregiver will go over the test results with you and discuss the importance and meaning of your results, as well as treatment options and the need for additional tests if necessary. OBTAINING THE TEST RESULTS  It is your responsibility to obtain your test results. Ask the lab or department performing the test when and how you will get your results. Document Released: 08/02/2004 Document Revised: 11/25/2013 Document Reviewed: 06/11/2008 [facility] Patient Information 2015 [place], [place]. This information is not intended to replace advice given to you by your health care provider. Make sure you discuss any questions you have with your health care provider.

## 2014-11-17 ENCOUNTER — Telehealth (HOSPITAL_COMMUNITY): Payer: Self-pay | Admitting: *Deleted

## 2014-11-17 NOTE — Telephone Encounter (Signed)
Pt called office stating she has not had a BM x 4 days and feels impacted.  States has taken several stool softeners with no relief.  Pt reports being unable to sit or eat due to pain.  Pt tearful on phone.  States has fleets enema but has not used.  Discussed with Dr. Whitney Muse who instructed pt to try fleets enema along with Milk of Mag, 2 tbsp every hour as needed until relief.  If no relief, call back.  Called pt back and left message on phone with instructions.

## 2014-12-04 ENCOUNTER — Encounter (HOSPITAL_COMMUNITY): Payer: Self-pay | Admitting: Hematology & Oncology

## 2014-12-04 ENCOUNTER — Encounter (HOSPITAL_BASED_OUTPATIENT_CLINIC_OR_DEPARTMENT_OTHER): Payer: Medicaid Other | Admitting: Hematology & Oncology

## 2014-12-04 ENCOUNTER — Encounter (HOSPITAL_COMMUNITY): Payer: Medicaid Other | Attending: Oncology

## 2014-12-04 VITALS — BP 140/71 | HR 61 | Temp 98.2°F | Resp 20

## 2014-12-04 VITALS — BP 156/80 | HR 73 | Temp 98.2°F | Resp 20 | Wt 229.6 lb

## 2014-12-04 DIAGNOSIS — G4700 Insomnia, unspecified: Secondary | ICD-10-CM

## 2014-12-04 DIAGNOSIS — C50919 Malignant neoplasm of unspecified site of unspecified female breast: Secondary | ICD-10-CM

## 2014-12-04 DIAGNOSIS — C50412 Malignant neoplasm of upper-outer quadrant of left female breast: Secondary | ICD-10-CM | POA: Diagnosis not present

## 2014-12-04 DIAGNOSIS — E876 Hypokalemia: Secondary | ICD-10-CM | POA: Insufficient documentation

## 2014-12-04 DIAGNOSIS — R319 Hematuria, unspecified: Secondary | ICD-10-CM | POA: Diagnosis present

## 2014-12-04 DIAGNOSIS — Z5111 Encounter for antineoplastic chemotherapy: Secondary | ICD-10-CM | POA: Diagnosis not present

## 2014-12-04 DIAGNOSIS — K5900 Constipation, unspecified: Secondary | ICD-10-CM

## 2014-12-04 DIAGNOSIS — Z720 Tobacco use: Secondary | ICD-10-CM

## 2014-12-04 DIAGNOSIS — K5909 Other constipation: Secondary | ICD-10-CM

## 2014-12-04 DIAGNOSIS — Z170 Estrogen receptor positive status [ER+]: Secondary | ICD-10-CM

## 2014-12-04 LAB — COMPREHENSIVE METABOLIC PANEL
ALT: 16 U/L (ref 14–54)
AST: 21 U/L (ref 15–41)
Albumin: 3.9 g/dL (ref 3.5–5.0)
Alkaline Phosphatase: 67 U/L (ref 38–126)
Anion gap: 8 (ref 5–15)
BUN: 18 mg/dL (ref 6–20)
CO2: 30 mmol/L (ref 22–32)
Calcium: 9.2 mg/dL (ref 8.9–10.3)
Chloride: 100 mmol/L — ABNORMAL LOW (ref 101–111)
Creatinine, Ser: 0.64 mg/dL (ref 0.44–1.00)
GFR calc Af Amer: >60 mL/min (ref >60)
GFR calc non Af Amer: >60 mL/min (ref >60)
Glucose, Bld: 100 mg/dL — ABNORMAL HIGH (ref 65–99)
Potassium: 3.9 mmol/L (ref 3.5–5.1)
Sodium: 138 mmol/L (ref 135–145)
Total Bilirubin: 1.1 mg/dL (ref 0.3–1.2)
Total Protein: 7 g/dL (ref 6.5–8.1)

## 2014-12-04 LAB — CBC WITH DIFFERENTIAL/PLATELET
Basophils Absolute: 0 10*3/uL (ref 0.0–0.1)
Basophils Relative: 0 % (ref 0–1)
Eosinophils Absolute: 0.4 10*3/uL (ref 0.0–0.7)
Eosinophils Relative: 4 % (ref 0–5)
HCT: 46.4 % — ABNORMAL HIGH (ref 36.0–46.0)
Hemoglobin: 15.8 g/dL — ABNORMAL HIGH (ref 12.0–15.0)
Lymphocytes Relative: 18 % (ref 12–46)
Lymphs Abs: 1.5 10*3/uL (ref 0.7–4.0)
MCH: 31.3 pg (ref 26.0–34.0)
MCHC: 34.1 g/dL (ref 30.0–36.0)
MCV: 92.1 fL (ref 78.0–100.0)
Monocytes Absolute: 0.5 10*3/uL (ref 0.1–1.0)
Monocytes Relative: 6 % (ref 3–12)
Neutro Abs: 6 10*3/uL (ref 1.7–7.7)
Neutrophils Relative %: 72 % (ref 43–77)
Platelets: 193 10*3/uL (ref 150–400)
RBC: 5.04 MIL/uL (ref 3.87–5.11)
RDW: 15.1 % (ref 11.5–15.5)
WBC: 8.4 10*3/uL (ref 4.0–10.5)

## 2014-12-04 MED ORDER — SODIUM CHLORIDE 0.9 % IJ SOLN
10.0000 mL | INTRAMUSCULAR | Status: DC | PRN
  Administered 2014-12-04: 10 mL
  Filled 2014-12-04: qty 10

## 2014-12-04 MED ORDER — DEXAMETHASONE SODIUM PHOSPHATE 10 MG/ML IJ SOLN: 10.0000 mg | Freq: Once | INTRAMUSCULAR | Status: DC

## 2014-12-04 MED ORDER — SODIUM CHLORIDE 0.9 % IV SOLN: 150.0000 mg | Freq: Once | INTRAVENOUS | Status: DC

## 2014-12-04 MED ORDER — SODIUM CHLORIDE 0.9 % IV SOLN
Freq: Once | INTRAVENOUS | Status: AC
  Administered 2014-12-04: 11:00:00 via INTRAVENOUS
  Filled 2014-12-04: qty 5

## 2014-12-04 MED ORDER — PALONOSETRON HCL INJECTION 0.25 MG/5ML
INTRAVENOUS | Status: AC
  Filled 2014-12-04: qty 5

## 2014-12-04 MED ORDER — HEPARIN SOD (PORK) LOCK FLUSH 100 UNIT/ML IV SOLN
500.0000 [IU] | Freq: Once | INTRAVENOUS | Status: AC | PRN
  Administered 2014-12-04: 500 [IU]
  Filled 2014-12-04 (×2): qty 5

## 2014-12-04 MED ORDER — SODIUM CHLORIDE 0.9 % IV SOLN: 8.0000 mg | Freq: Once | INTRAVENOUS | Status: DC

## 2014-12-04 MED ORDER — PALONOSETRON HCL INJECTION 0.25 MG/5ML
0.2500 mg | Freq: Once | INTRAVENOUS | Status: AC
  Administered 2014-12-04: 0.25 mg via INTRAVENOUS

## 2014-12-04 MED ORDER — FLUOROURACIL CHEMO INJECTION 2.5 GM/50ML
600.0000 mg/m2 | Freq: Once | INTRAVENOUS | Status: AC
  Administered 2014-12-04: 1250 mg via INTRAVENOUS
  Filled 2014-12-04: qty 25

## 2014-12-04 MED ORDER — LORAZEPAM 2 MG/ML IJ SOLN
0.5000 mg | Freq: Once | INTRAMUSCULAR | Status: AC
  Administered 2014-12-04: 0.5 mg via INTRAVENOUS
  Filled 2014-12-04: qty 1

## 2014-12-04 MED ORDER — SODIUM CHLORIDE 0.9 % IV SOLN
600.0000 mg/m2 | Freq: Once | INTRAVENOUS | Status: AC
  Administered 2014-12-04: 1260 mg via INTRAVENOUS
  Filled 2014-12-04: qty 50

## 2014-12-04 MED ORDER — METHOTREXATE SODIUM (PF) CHEMO INJECTION 250 MG/10ML
85.0000 mg | Freq: Once | INTRAMUSCULAR | Status: AC
  Administered 2014-12-04: 85 mg via INTRAVENOUS
  Filled 2014-12-04: qty 3.4

## 2014-12-04 MED ORDER — SODIUM CHLORIDE 0.9 % IV SOLN
Freq: Once | INTRAVENOUS | Status: AC
  Administered 2014-12-04: 10:00:00 via INTRAVENOUS

## 2014-12-04 NOTE — Progress Notes (Signed)
[facility] Progress Note  Patient Care Team: Sinda Du, MD as PCP - General (Internal Medicine)  CHIEF COMPLAINTS/PURPOSE OF CONSULTATION:  L breast cancer History of L breast core biopsy for microcalcifications in 2014, fibroadenoma. CMF cycle 1 on 10/02/2014 No matching staging information was found for the patient.  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 This has imaging features suspicious for malignancy.   07/01/2014 Initial Biopsy ER+ 100%, PR+ 91%, Ki-67 32% Invasive mammary carcinoma with lobular features   08/20/2014 Surgery Left breast seed localized lumpectomy and left axilla sentinel lymph node biopsy. Final Staging TNM: pT2, pN1a, pMX.   09/17/2014 Surgery port-a cath placement and re-excision   10/02/2014 -  Chemotherapy CMF.  patient refused Taxane-containing chemotherapy.    HISTORY OF PRESENTING ILLNESS:  Regina Mcmahon 64 y.o. 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 She is due for cycle #4 of CMF. She has done remarkably well with treatment.   Reports mouth pain following dental extraction. She reports that the tooth was extracted before it was numbed. The pain last for 2 weeks, but has since improved. She reports having constipation after her chemotherapy last time.  She is prepared for it now however. She has no fever, chills, nausea or vomiting. She is also pleased with how well she has tolerated treatment.  MEDICAL HISTORY:  Past Medical History  Diagnosis Date  . Hypertension   . Panic attacks   . Chronic airway obstruction    . Pain     Hx.pain in joint involving pelvic region and thigh; pain in limb  . Sinusitis   . Asthma   . Palpitations   . Hemorrhage rectum     and anus.  . Symptomatic menopausal or female climacteric states   . Asymptomatic varicose veins   . Other symptoms involving digestive system(787.99)   . Depression   . Breast cancer 06/2014    left  . Insomnia     SURGICAL HISTORY: Past Surgical History  Procedure Laterality Date  . Cesarean section    . Cholecystectomy  1985  . Tubal ligation    . Colonoscopy    . Radioactive seed guided mastectomy with axillary sentinel lymph node biopsy Left 08/20/2014    Procedure: LEFT BREAST LUMPECTOMY WITH RADIOACTIVE SEED LOCALIZATION AND SENTINEL LYMPH NODE MAPPING;  Surgeon: Erroll Luna, MD;  Location: [facility];  Service: General;  Laterality: Left;  . Portacath placement Right 09/17/2014    Procedure: INSERTION PORT-A-CATH WITH ULTRASOUND;  Surgeon: Erroll Luna, MD;  Location: [facility];  Service: General;  Laterality: Right;  IJ  . Re-excision of breast lumpectomy Left 09/17/2014    Procedure: RE-EXCISION OF BREAST LUMPECTOMY;  Surgeon: Erroll Luna, MD;  Location: [facility];  Service: General;  Laterality: Left;    SOCIAL HISTORY: History   Social History  . Marital Status: Divorced    Spouse Name: N/A  . Number of Children: 2  . Years of Education: N/A   Occupational History  . Not on file.  Social History Main Topics  . Smoking status: Current Every Day Smoker -- 0.50 packs/day    Types: Cigarettes  . Smokeless tobacco: Never Used  . Alcohol Use: No  . Drug Use: No  . Sexual Activity: No   Other Topics Concern  . Not on file   Social History Narrative  Smokes 2 PPD   FAMILY HISTORY: Family History  Problem Relation Age of Onset  . Cancer Mother     ovarian; deceased age 16  . Diabetes Mother   . Other Father     died in MVA at age 45  . Other Sister      mitral valve disorder  . Cancer Brother     colon; deceased age 68  . Cancer Paternal Aunt     bone or blood   indicated that her mother is deceased. She indicated that her father is deceased. She indicated that her brother is deceased. She indicated that her paternal aunt is deceased.   ALLERGIES:  has No Known Allergies.  MEDICATIONS:  Current Outpatient Prescriptions  Medication Sig Dispense Refill  . acetaminophen (TYLENOL) 325 MG tablet Take 650 mg by mouth every 6 (six) hours as needed.    Marland Kitchen albuterol (PROVENTIL HFA;VENTOLIN HFA) 108 (90 BASE) MCG/ACT inhaler Inhale 2 puffs into the lungs every 6 (six) hours as needed for wheezing or shortness of breath.    . ALPRAZolam (XANAX) 0.5 MG tablet Take 0.5 mg by mouth 3 (three) times daily as needed for anxiety.    . calcium carbonate (TUMS - DOSED IN MG ELEMENTAL CALCIUM) 500 MG chewable tablet Chew 1 tablet by mouth 3 (three) times daily with meals.    . Cyclophosphamide (CYTOXAN IJ) Inject as directed. Every 21 days starting 10/02/14    . diphenhydramine-acetaminophen (TYLENOL PM) 25-500 MG TABS Take 2 tablets by mouth at bedtime.    . docusate sodium (COLACE) 100 MG capsule Take 100 mg by mouth 3 (three) times daily as needed for mild constipation.    . Eszopiclone 3 MG TABS Take 1 tablet (3 mg total) by mouth at bedtime. Take immediately before bedtime 30 tablet 3  . fluorouracil in sodium chloride 0.9 % 50 mL Inject into the vein once. Every 21 days starting 10/02/14    . hydrochlorothiazide (MICROZIDE) 12.5 MG capsule Take 12.5 mg by mouth daily.    Marland Kitchen ibuprofen (ADVIL,MOTRIN) 200 MG tablet Take 200 mg by mouth every 6 (six) hours as needed.    . lidocaine-prilocaine (EMLA) cream Apply a quarter size amount to port site 1 hour prior to chemo. Do not rub in. Cover with plastic wrap. 30 g 3  . METHOTREXATE SODIUM IJ Inject as directed. Every 21 days starting 10/02/14    . omeprazole (PRILOSEC) 40 MG capsule Take 1 capsule (40 mg total) by  mouth daily. 30 capsule 4  . ondansetron (ZOFRAN) 8 MG tablet Take 1 tablet every 8 hours as needed for nausea/vomiting. 30 tablet 2  . tiZANidine (ZANAFLEX) 4 MG tablet     . oxyCODONE-acetaminophen (ROXICET) 5-325 MG per tablet Take 1-2 tablets by mouth every 4 (four) hours as needed. (Patient not taking: Reported on 11/12/2014) 30 tablet 0  . oxyCODONE-acetaminophen (ROXICET) 5-325 MG per tablet Take 1 tablet by mouth every 4 (four) hours as needed. (Patient not taking: Reported on 12/04/2014) 30 tablet 0  . potassium chloride SA (K-DUR,KLOR-CON) 20 MEQ tablet Take 1 tablet (20 mEq total) by mouth 2 (two) times daily. (Patient not taking: Reported  on 12/04/2014) 60 tablet 1  . prochlorperazine (COMPAZINE) 10 MG tablet Take 1 tablet (10 mg total) by mouth every 6 (six) hours as needed (Nausea or vomiting). (Patient not taking: Reported on 12/04/2014) 30 tablet 1   No current facility-administered medications for this visit.    Review of Systems  Constitutional: Negative for fever, chills, weight loss and malaise/fatigue.  HENT: Negative for congestion, hearing loss, nosebleeds, sore throat and tinnitus.   Eyes: Negative for blurred vision, double vision, pain and discharge.  Respiratory: Negative for cough, hemoptysis, sputum production, shortness of breath and wheezing.   Cardiovascular: Negative for chest pain, palpitations, claudication, leg swelling and PND.  Gastrointestinal: Negative for heartburn, nausea, vomiting, abdominal pain, diarrhea, constipation, blood in stool and melena.  Genitourinary: Negative for dysuria, urgency, frequency and hematuria.  Musculoskeletal: Negative for myalgias, joint pain and falls.  Skin: Negative for itching and rash.  Neurological: Negative for dizziness, tingling, tremors, sensory change, speech change, focal weakness, seizures, loss of consciousness, weakness and headaches.  Endo/Heme/Allergies: Does not bruise/bleed easily.  Psychiatric/Behavioral:  Negative for depression, suicidal ideas, memory loss and substance abuse. The patient is not nervous/anxious and does not have insomnia.     PHYSICAL EXAMINATION: ECOG PERFORMANCE STATUS: 0 - Asymptomatic  Filed Vitals:   12/04/14 0800  BP: 156/80  Pulse: 73  Temp: 98.2 F (36.8 C)  Resp: 20   Filed Weights   12/04/14 0800  Weight: 229 lb 9.6 oz (104.146 kg)     Physical Exam  Constitutional: She is oriented to person, place, and time and well-developed, well-nourished, and in no distress.  HENT:  Head: Normocephalic and atraumatic.  Nose: Nose normal.  Mouth/Throat: Oropharynx is clear and moist. No oropharyngeal exudate.  Eyes: Conjunctivae and EOM are normal. Pupils are equal, round, and reactive to light. Right eye exhibits no discharge. Left eye exhibits no discharge. No scleral icterus.  Neck: Normal range of motion. Neck supple. No tracheal deviation present. No thyromegaly present.  Cardiovascular: Normal rate, regular rhythm and normal heart sounds.  Exam reveals no gallop and no friction rub.   No murmur heard. Pulmonary/Chest: Effort normal and breath sounds normal. She has no wheezes. She has no rales.  Abdominal: Soft. Bowel sounds are normal. She exhibits no distension and no mass. There is no tenderness. There is no rebound and no guarding.  Musculoskeletal: Normal range of motion. She exhibits no edema.  Lymphadenopathy:    She has no cervical adenopathy.  Neurological: She is alert and oriented to person, place, and time. She has normal reflexes. No cranial nerve deficit. Gait normal. Coordination normal.  Skin: Skin is warm and dry. No rash noted.  Psychiatric: Mood, memory, affect and judgment normal.  Nursing note and vitals reviewed.   LABORATORY DATA:  I have reviewed the data as listed Lab Results  Component Value Date   WBC 9.4 11/12/2014   HGB 16.7* 11/12/2014   HCT 49.0* 11/12/2014   MCV 91.6 11/12/2014   PLT 151 11/12/2014     Chemistry        Component Value Date/Time   NA 139 11/12/2014 0830   K 3.3* 11/12/2014 0830   CL 103 11/12/2014 0830   CO2 29 11/12/2014 0830   BUN 21 11/12/2014 0830   CREATININE 0.72 11/12/2014 0830      Component Value Date/Time   CALCIUM 9.2 11/12/2014 0830   ALKPHOS 77 11/12/2014 0830   AST 16 11/12/2014 0830   ALT 13 11/12/2014 0830  BILITOT 1.2 11/12/2014 0830      ASSESSMENT & PLAN:   Stage II, infiltrating ductal carcinoma of the left breast, ER positive, PR positive, HER-2 negative Anxiety Tobacco abuse Insomnia, difficulty falling asleep Constipation  She will proceed with cycle #4 of CMF today. She will return in 3 weeks with an office visit, laboratory studies and plan for cycle #5. She has done quite well with treatment and nose side effects and symptoms of concern. Goal  is to complete 6 cycles of therapy.  We again addressed the importance of smoking cessation. I gave her several options to help her discontinue smoking. She would like to work on this by herself first.  I recommend milk of magnesia for her constipation should it worsen after this chemotherapy cycle. She has a stool softener she is now routinely taking.  Her anxiety and insomnia is markedly improved.   She'll return in 3 weeks for treatment, office visit and labs.  This note was signed electronically    This document serves as a record of services personally performed by Ancil Linsey, MD. It was created on her behalf by Pearlie Oyster, a trained medical scribe. The creation of this record is based on the scribe's personal observations and the provider's statements to them. This document has been checked and approved by the attending provider.    I have reviewed the above documentation for accuracy and completeness, and I agree with the above.  [place] Fam. Airyonna Franklyn MD

## 2014-12-04 NOTE — Patient Instructions (Signed)
[facility] Discharge Instructions for Patients Receiving Chemotherapy  Today you received the following chemotherapy agents Cytoxan, methotrexate and 5FU.  To help prevent nausea and vomiting after your treatment, we encourage you to take your nausea medication as instructed. If you develop nausea and vomiting that is not controlled by your nausea medication, call the clinic. If it is after clinic hours your family physician or the after hours number for the clinic or go to the Emergency Department. BELOW ARE SYMPTOMS THAT SHOULD BE REPORTED IMMEDIATELY:  *FEVER GREATER THAN 101.0 F  *CHILLS WITH OR WITHOUT FEVER  NAUSEA AND VOMITING THAT IS NOT CONTROLLED WITH YOUR NAUSEA MEDICATION  *UNUSUAL SHORTNESS OF BREATH  *UNUSUAL BRUISING OR BLEEDING  TENDERNESS IN MOUTH AND THROAT WITH OR WITHOUT PRESENCE OF ULCERS  *URINARY PROBLEMS  *BOWEL PROBLEMS  UNUSUAL RASH Items with * indicate a potential emergency and should be followed up as soon as possible.  Return as scheduled.  I have been informed and understand all the instructions given to me. I know to contact the clinic, my physician, or go to the Emergency Department if any problems should occur. I do not have any questions at this time, but understand that I may call the clinic during office hours or the Patient Navigator at [phone] should I have any questions or need assistance in obtaining follow up care.    __________________________________________  _____________  __________ Signature of Patient or Authorized Representative            Date                   Time    __________________________________________ Nurse's Signature

## 2014-12-04 NOTE — Progress Notes (Signed)
Tolerated chemo well. 

## 2014-12-04 NOTE — Patient Instructions (Addendum)
[place] at [facility]  Discharge Instructions:  You saw Dr Whitney Muse today. Chemotherapy cycle 4 today. Return in 3 weeks to see the doctor.   Return in 3 weeks for cycle 5. Please call the clinic if you have any questions or concerns _______________________________________________________________  Thank you for choosing [place] at [facility] to provide your oncology and hematology care.  To afford each patient quality time with our providers, please arrive at least 15 minutes before your scheduled appointment.  You need to re-schedule your appointment if you arrive 10 or more minutes late.  We strive to give you quality time with our providers, and arriving late affects you and other patients whose appointments are after yours.  Also, if you no show three or more times for appointments you may be dismissed from the clinic.  Again, thank you for choosing [place] at [place] hope is that these requests will allow you access to exceptional care and in a timely manner. _______________________________________________________________  If you have questions after your visit, please contact our office at [phone] between the hours of 8:30 a.m. and 5:00 p.m. Voicemails left after 4:30 p.m. will not be returned until the following business day. _______________________________________________________________  For prescription refill requests, have your pharmacy contact our office. _______________________________________________________________  Recommendations made by the consultant and any test results will be sent to your referring physician. _______________________________________________________________

## 2014-12-05 ENCOUNTER — Encounter (HOSPITAL_BASED_OUTPATIENT_CLINIC_OR_DEPARTMENT_OTHER): Payer: Medicaid Other

## 2014-12-05 ENCOUNTER — Encounter (HOSPITAL_COMMUNITY): Payer: Self-pay

## 2014-12-05 VITALS — BP 130/64 | HR 71 | Temp 98.1°F | Resp 20

## 2014-12-05 DIAGNOSIS — C50412 Malignant neoplasm of upper-outer quadrant of left female breast: Secondary | ICD-10-CM

## 2014-12-05 DIAGNOSIS — Z5189 Encounter for other specified aftercare: Secondary | ICD-10-CM

## 2014-12-05 MED ORDER — PEGFILGRASTIM INJECTION 6 MG/0.6ML ~~LOC~~
PREFILLED_SYRINGE | SUBCUTANEOUS | Status: AC
  Filled 2014-12-05: qty 0.6

## 2014-12-05 MED ORDER — PEGFILGRASTIM INJECTION 6 MG/0.6ML ~~LOC~~
6.0000 mg | PREFILLED_SYRINGE | Freq: Once | SUBCUTANEOUS | Status: AC
  Administered 2014-12-05: 6 mg via SUBCUTANEOUS

## 2014-12-05 NOTE — Patient Instructions (Signed)
[place] at [facility]  Discharge Instructions:  neulasta today Please follow up as scheduled  Call the clinic if you have any questions or concerns _______________________________________________________________  Thank you for choosing [place] at [facility] to provide your oncology and hematology care.  To afford each patient quality time with our providers, please arrive at least 15 minutes before your scheduled appointment.  You need to re-schedule your appointment if you arrive 10 or more minutes late.  We strive to give you quality time with our providers, and arriving late affects you and other patients whose appointments are after yours.  Also, if you no show three or more times for appointments you may be dismissed from the clinic.  Again, thank you for choosing [facility] at [place] hope is that these requests will allow you access to exceptional care and in a timely manner. _______________________________________________________________  If you have questions after your visit, please contact our office at [phone] between the hours of 8:30 a.m. and 5:00 p.m. Voicemails left after 4:30 p.m. will not be returned until the following business day. _______________________________________________________________  For prescription refill requests, have your pharmacy contact our office. _______________________________________________________________  Recommendations made by the consultant and any test results will be sent to your referring physician. _______________________________________________________________

## 2014-12-05 NOTE — Progress Notes (Signed)
Regina Mcmahon's reason for visit today is for an injection as scheduled per MD orders.     Regina Mcmahon also received neualsta 6 mg injection sq in abdomen per MD orders; see MAR for administration details.  Regina Mcmahon tolerated all procedures well and without incident; questions were answered and patient was discharged.

## 2014-12-09 ENCOUNTER — Encounter: Payer: 59 | Admitting: Genetic Counselor

## 2014-12-10 ENCOUNTER — Telehealth (HOSPITAL_COMMUNITY): Payer: Self-pay | Admitting: *Deleted

## 2014-12-10 NOTE — Telephone Encounter (Signed)
Patient instructed to increase Prilosec '40mg'$  BID and to start Miralax 1 capful daily per Dr. Whitney Muse. Bland foods daily and [employer]. Patient has had loose watery stools since getting Neulasta injection last week. Patient's last loose stool was 12/09/14 am after eating eggs. Patient having a lot of bubbling in stomach and feels like it is going to come up into throat. Burping a lot. No fever no chills. Patient scheduled to see Gershon Mussel on Monday if needed. She may cancel appointment if she is doing better. Patient instructed to call by Friday and let us know how she is doing with stomach bubbling and loose stools.

## 2014-12-15 ENCOUNTER — Ambulatory Visit (HOSPITAL_COMMUNITY): Payer: 59 | Admitting: Oncology

## 2014-12-23 ENCOUNTER — Encounter (HOSPITAL_BASED_OUTPATIENT_CLINIC_OR_DEPARTMENT_OTHER): Payer: Medicaid Other | Admitting: Genetic Counselor

## 2014-12-23 ENCOUNTER — Encounter (HOSPITAL_COMMUNITY): Payer: Self-pay | Admitting: Genetic Counselor

## 2014-12-23 DIAGNOSIS — Z800 Family history of malignant neoplasm of digestive organs: Secondary | ICD-10-CM | POA: Diagnosis not present

## 2014-12-23 DIAGNOSIS — C50912 Malignant neoplasm of unspecified site of left female breast: Secondary | ICD-10-CM

## 2014-12-23 DIAGNOSIS — Z809 Family history of malignant neoplasm, unspecified: Secondary | ICD-10-CM

## 2014-12-23 DIAGNOSIS — Z315 Encounter for genetic counseling: Secondary | ICD-10-CM

## 2014-12-23 DIAGNOSIS — Z808 Family history of malignant neoplasm of other organs or systems: Secondary | ICD-10-CM

## 2014-12-23 DIAGNOSIS — C50412 Malignant neoplasm of upper-outer quadrant of left female breast: Secondary | ICD-10-CM | POA: Diagnosis not present

## 2014-12-23 DIAGNOSIS — Z8051 Family history of malignant neoplasm of kidney: Secondary | ICD-10-CM | POA: Diagnosis not present

## 2014-12-23 DIAGNOSIS — Z806 Family history of leukemia: Secondary | ICD-10-CM

## 2014-12-23 DIAGNOSIS — Z8041 Family history of malignant neoplasm of ovary: Secondary | ICD-10-CM

## 2014-12-23 DIAGNOSIS — Z801 Family history of malignant neoplasm of trachea, bronchus and lung: Secondary | ICD-10-CM

## 2014-12-24 NOTE — Progress Notes (Signed)
REFERRING PROVIDER: Sinda Du, MD [place], [place] [address]  PRIMARY PROVIDER:  Alonza Bogus, MD  PRIMARY REASON FOR VISIT:  1. Breast cancer, left   2. Family history of ovarian cancer   3. Family history of colon cancer   4. Family history of cancer      HISTORY OF PRESENT ILLNESS:   [name], a 64 y.o. female, was seen for a [facility] cancer genetics consultation at the request of Dr. Luan Pulling due to a personal and family history of cancer.  Ms. Garman presents to clinic today to discuss the possibility of a hereditary predisposition to cancer, genetic testing, and to further clarify her future cancer risks, as well as potential cancer risks for family members.   In 2015, at the age of 65, Ms. 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  This was treated with lumpectomy and chemotherapy.  She is a current smoker.    CANCER HISTORY:    Carcinoma of upper-outer quadrant of left female breast   06/24/2014 Mammogram Irregular, heterogeneous mass-like area in the 2 o'clock position of the left breast, corresponding to the palpable mass, as described above. This has imaging features suspicious for malignancy.   07/01/2014 Initial Biopsy ER+ 100%, PR+ 91%, Ki-67 32% Invasive mammary carcinoma with lobular features   08/20/2014 Surgery Left breast seed localized lumpectomy and left axilla sentinel lymph node biopsy. Final Staging TNM: pT2, pN1a, pMX.   09/17/2014 Surgery port-a cath placement and re-excision   10/02/2014 -  Chemotherapy CMF.  patient refused Taxane-containing chemotherapy.     HORMONAL RISK FACTORS:  Menarche was at age 31-8. First live birth at age 23. OCP use for less than 5 years.  Ovaries intact: yes.  Hysterectomy: no.  Menopausal status: postmenopausal.  HRT use: 0 years. Colonoscopy: yes; 3 polyps removed at last colonoscopy. Mammogram within the last year: yes. Number of  breast biopsies: 2. Up to date with pelvic exams:  yes. 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 Hypertension   . Panic attacks   . Chronic airway obstruction   . Pain     Hx.pain in joint involving pelvic region and thigh; pain in limb  . Sinusitis   . Asthma   . Palpitations   . Hemorrhage rectum     and anus.  . Symptomatic menopausal or female climacteric states   . Asymptomatic varicose veins   . Other symptoms involving digestive system(787.99)   . Depression   . Breast cancer 06/2014    left  . Insomnia     Past Surgical History  Procedure Laterality Date  . Cesarean section    . Cholecystectomy  1985  . Tubal ligation    . Colonoscopy    . Radioactive seed guided mastectomy with axillary sentinel lymph node biopsy Left 08/20/2014    Procedure: LEFT BREAST LUMPECTOMY WITH RADIOACTIVE SEED LOCALIZATION AND SENTINEL LYMPH NODE MAPPING;  Surgeon: Erroll Luna, MD;  Location: [facility];  Service: General;  Laterality: Left;  . Portacath placement Right 09/17/2014    Procedure: INSERTION PORT-A-CATH WITH ULTRASOUND;  Surgeon: Erroll Luna, MD;  Location: [facility];  Service: General;  Laterality: Right;  IJ  . Re-excision of breast lumpectomy Left 09/17/2014    Procedure: RE-EXCISION OF BREAST LUMPECTOMY;  Surgeon: [name]  Cornett, MD;  Location: [facility];  Service: General;  Laterality: Left;    History   Social History  . Marital Status: Divorced    Spouse Name: N/A  . Number of Children: 2  . Years of Education: N/A   Social History Main Topics  . Smoking status: Current Every Day Smoker -- 2.00 packs/day    Types: Cigarettes  . Smokeless tobacco: Never Used  . Alcohol Use: No  . Drug Use: No  . Sexual Activity: No   Other Topics Concern  . Not on file   Social History Narrative      FAMILY HISTORY:  We obtained a detailed, 4-generation family history.  Significant diagnoses are listed below: Family History  Problem Relation Age of Onset  . Diabetes Mother   . Ovarian cancer Mother 39  . Other Father     died in MVA at age 36  . Other Sister     mitral valve disorder  . Melanoma Sister 54    removed from back of leg  . Colon cancer Brother   . Cancer Paternal Aunt     leukemia, bone, or lung cancer  . Alzheimer's disease Maternal Grandmother   . Heart attack Maternal Grandfather   . Heart attack Paternal Grandmother   . COPD Paternal Grandfather   . Emphysema Paternal Grandfather   . Congestive Heart Failure Paternal Aunt   . Stomach cancer Paternal Uncle   . Kidney cancer Maternal Uncle   . Cancer Cousin     dx. teens/dx. 40s  . Cancer Cousin   . Colon cancer Cousin    Ms. Prak was diagnosed with breast cancer at 71.  Her mother was diagnosed with ovarian cancer at 10, and passed away at 33.  Her father died in a motor vehicle accident at 57.  Her brother died from colon cancer at 16, and her sister, 65, received a melanoma diagnosis at 46.  She has one son and one daughter--ages 72 and 28--who are reportedly cancer-free.  Ms. Pesch has a maternal uncle who died from kidney cancer.  She also has two maternal cousins who died from either colon (age 72) or an unspecified type of cancer (age unknown).  Her maternal grandparents lived into their 62s-90s, reportedly cancer-free.  Several of Ms. Heinemann's paternal aunts and uncles had cancer--including leukemia, bone, stomach, and lung cancers.  Additionally two paternal cousins also had cancer--one of whom passed away in his teens.  Ms. Kaman paternal grandparents were also reportedly cancer-free.  Her maternal ancestors are of Caucasian descent, and her paternal ancestors are of Caucasian descent. There is no reported Ashkenazi Jewish ancestry. There is no known consanguinity.  GENETIC COUNSELING ASSESSMENT: LASHA ECHEVERRIA is a 64 y.o. female with a personal history of breast cancer and a family history of cancer which is somewhat suggestive of a hereditary breast cancer syndrome and predisposition to cancer. We, therefore, discussed and recommended the following at today's visit.   DISCUSSION: We reviewed the characteristics, features and inheritance patterns of hereditary cancer syndromes. We discussed that the most common reason for hereditary breast and ovarian cancer is due to a BRCA mutation.  We discussed that mutations in genes other than BRCA1/2 may be responsible for a hereditary breast cancer syndrome.  Additionally, we discussed her brother's diagnosis of colon cancer at 28, and genetic testing options which further include analysis of genes associated with hereditary colon cancer risks. We also discussed genetic testing, the process of  testing, insurance coverage, and turn-around-time for results. We presented genetic testing options and discussed the implications of a negative, positive and/or variant of uncertain significant result. We recommended she pursue genetic testing for the "Comprehensive Cancer Panel" gene panel through [facility].   Based on Ms. Byrnes's personal and family history of cancer, she meets medical criteria for genetic testing. Despite that she meets criteria, she may still have an out of pocket cost. We discussed that if her out of pocket cost for testing is over $100, the laboratory will call and confirm whether she wants to proceed with testing.  If the out of pocket cost of testing is less than $100 she will be billed by the genetic testing laboratory.   PLAN: Despite our recommendation, Ms. Rodenbeck did not wish to pursue genetic testing at today's visit. She would like to speak with her family and find out more about her future insurance coverage before moving forward with genetic testing.  We understand this decision, and remain available to coordinate genetic testing at any time in the  future. We, therefore, recommend Ms. Bartolini continue to follow the cancer screening guidelines given by her primary healthcare provider.  Lastly, we encouraged Ms. Meadow to remain in contact with cancer genetics annually so that we can continuously update the family history and inform her of any changes in cancer genetics and testing that may be of benefit for this family.   Ms.  Leedy questions were answered to her satisfaction today. Our contact information was provided should additional questions or concerns arise. Thank you for the referral and allowing [place] to share in the care of your patient.   Jeanine Luz, MS Genetic Counselor [email or web address] phone: [phone]  The patient was seen for a total of 60 minutes in face-to-face genetic counseling.  This patient was discussed with Drs. Magrinat, Lindi Adie and/or Burr Medico who agrees with the above.    _______________________________________________________________________ For Office Staff:  Number of people involved in session: 1 Was an Intern/ student involved with case: no

## 2014-12-25 ENCOUNTER — Encounter (HOSPITAL_BASED_OUTPATIENT_CLINIC_OR_DEPARTMENT_OTHER): Payer: Medicaid Other | Admitting: Oncology

## 2014-12-25 ENCOUNTER — Encounter (HOSPITAL_COMMUNITY): Payer: Self-pay | Admitting: Oncology

## 2014-12-25 ENCOUNTER — Encounter (HOSPITAL_COMMUNITY): Payer: Medicaid Other | Attending: Oncology

## 2014-12-25 ENCOUNTER — Ambulatory Visit (HOSPITAL_COMMUNITY): Payer: 59 | Admitting: Hematology & Oncology

## 2014-12-25 VITALS — BP 129/65 | HR 70 | Temp 98.0°F | Resp 20

## 2014-12-25 VITALS — BP 153/69 | HR 80 | Temp 98.0°F | Resp 20 | Wt 228.2 lb

## 2014-12-25 DIAGNOSIS — R319 Hematuria, unspecified: Secondary | ICD-10-CM | POA: Diagnosis present

## 2014-12-25 DIAGNOSIS — C50412 Malignant neoplasm of upper-outer quadrant of left female breast: Secondary | ICD-10-CM

## 2014-12-25 DIAGNOSIS — G4700 Insomnia, unspecified: Secondary | ICD-10-CM

## 2014-12-25 DIAGNOSIS — E876 Hypokalemia: Secondary | ICD-10-CM

## 2014-12-25 LAB — CBC WITH DIFFERENTIAL/PLATELET
Basophils Absolute: 0 10*3/uL (ref 0.0–0.1)
Basophils Relative: 1 % (ref 0–1)
Eosinophils Absolute: 0.3 10*3/uL (ref 0.0–0.7)
Eosinophils Relative: 4 % (ref 0–5)
HCT: 46 % (ref 36.0–46.0)
Hemoglobin: 15.6 g/dL — ABNORMAL HIGH (ref 12.0–15.0)
Lymphocytes Relative: 20 % (ref 12–46)
Lymphs Abs: 1.4 10*3/uL (ref 0.7–4.0)
MCH: 31.6 pg (ref 26.0–34.0)
MCHC: 33.9 g/dL (ref 30.0–36.0)
MCV: 93.1 fL (ref 78.0–100.0)
Monocytes Absolute: 0.5 10*3/uL (ref 0.1–1.0)
Monocytes Relative: 6 % (ref 3–12)
Neutro Abs: 5.1 10*3/uL (ref 1.7–7.7)
Neutrophils Relative %: 69 % (ref 43–77)
Platelets: 196 10*3/uL (ref 150–400)
RBC: 4.94 MIL/uL (ref 3.87–5.11)
RDW: 14.4 % (ref 11.5–15.5)
WBC: 7.3 10*3/uL (ref 4.0–10.5)

## 2014-12-25 LAB — COMPREHENSIVE METABOLIC PANEL
ALT: 15 U/L (ref 14–54)
AST: 14 U/L — ABNORMAL LOW (ref 15–41)
Albumin: 3.7 g/dL (ref 3.5–5.0)
Alkaline Phosphatase: 72 U/L (ref 38–126)
Anion gap: 10 (ref 5–15)
BUN: 19 mg/dL (ref 6–20)
CO2: 30 mmol/L (ref 22–32)
Calcium: 9.5 mg/dL (ref 8.9–10.3)
Chloride: 100 mmol/L — ABNORMAL LOW (ref 101–111)
Creatinine, Ser: 0.76 mg/dL (ref 0.44–1.00)
GFR calc Af Amer: >60 mL/min (ref >60)
GFR calc non Af Amer: >60 mL/min (ref >60)
Glucose, Bld: 144 mg/dL — ABNORMAL HIGH (ref 65–99)
Potassium: 3.1 mmol/L — ABNORMAL LOW (ref 3.5–5.1)
Sodium: 140 mmol/L (ref 135–145)
Total Bilirubin: 0.9 mg/dL (ref 0.3–1.2)
Total Protein: 6.8 g/dL (ref 6.5–8.1)

## 2014-12-25 MED ORDER — TEMAZEPAM 15 MG PO CAPS: 15.0000 mg | ORAL_CAPSULE | Freq: Every evening | ORAL | Status: DC | PRN

## 2014-12-25 MED ORDER — ONDANSETRON HCL 40 MG/20ML IJ SOLN
Freq: Once | INTRAMUSCULAR | Status: AC
  Administered 2014-12-25: 8 mg via INTRAVENOUS
  Filled 2014-12-25: qty 4

## 2014-12-25 MED ORDER — DEXAMETHASONE SODIUM PHOSPHATE 10 MG/ML IJ SOLN: 10.0000 mg | Freq: Once | INTRAMUSCULAR | Status: DC

## 2014-12-25 MED ORDER — HEPARIN SOD (PORK) LOCK FLUSH 100 UNIT/ML IV SOLN
INTRAVENOUS | Status: AC
  Filled 2014-12-25: qty 5

## 2014-12-25 MED ORDER — FLUOROURACIL CHEMO INJECTION 2.5 GM/50ML
600.0000 mg/m2 | Freq: Once | INTRAVENOUS | Status: AC
  Administered 2014-12-25: 1250 mg via INTRAVENOUS
  Filled 2014-12-25: qty 25

## 2014-12-25 MED ORDER — SODIUM CHLORIDE 0.9 % IV SOLN
600.0000 mg/m2 | Freq: Once | INTRAVENOUS | Status: AC
  Administered 2014-12-25: 1260 mg via INTRAVENOUS
  Filled 2014-12-25: qty 50

## 2014-12-25 MED ORDER — SODIUM CHLORIDE 0.9 % IV SOLN
150.0000 mg | Freq: Once | INTRAVENOUS | Status: AC
  Administered 2014-12-25: 150 mg via INTRAVENOUS
  Filled 2014-12-25: qty 5

## 2014-12-25 MED ORDER — SODIUM CHLORIDE 0.9 % IV SOLN: 8.0000 mg | Freq: Once | INTRAVENOUS | Status: DC

## 2014-12-25 MED ORDER — HEPARIN SOD (PORK) LOCK FLUSH 100 UNIT/ML IV SOLN
500.0000 [IU] | Freq: Once | INTRAVENOUS | Status: AC | PRN
  Administered 2014-12-25: 500 [IU]

## 2014-12-25 MED ORDER — OXYCODONE-ACETAMINOPHEN 5-325 MG PO TABS: 1.0000 | ORAL_TABLET | ORAL | Status: DC | PRN

## 2014-12-25 MED ORDER — POTASSIUM CHLORIDE CRYS ER 20 MEQ PO TBCR: 20.0000 meq | EXTENDED_RELEASE_TABLET | Freq: Two times a day (BID) | ORAL | Status: DC

## 2014-12-25 MED ORDER — METHOTREXATE SODIUM (PF) CHEMO INJECTION 250 MG/10ML
85.0000 mg | Freq: Once | INTRAMUSCULAR | Status: AC
  Administered 2014-12-25: 85 mg via INTRAVENOUS
  Filled 2014-12-25: qty 3.4

## 2014-12-25 MED ORDER — SODIUM CHLORIDE 0.9 % IJ SOLN
10.0000 mL | INTRAMUSCULAR | Status: DC | PRN
  Administered 2014-12-25: 10 mL
  Filled 2014-12-25: qty 10

## 2014-12-25 MED ORDER — SODIUM CHLORIDE 0.9 % IV SOLN
Freq: Once | INTRAVENOUS | Status: AC
  Administered 2014-12-25: 10:00:00 via INTRAVENOUS

## 2014-12-25 MED ORDER — PALONOSETRON HCL INJECTION 0.25 MG/5ML
0.2500 mg | Freq: Once | INTRAVENOUS | Status: AC
  Administered 2014-12-25: 0.25 mg via INTRAVENOUS
  Filled 2014-12-25: qty 5

## 2014-12-25 MED ORDER — LORAZEPAM 2 MG/ML IJ SOLN
0.5000 mg | Freq: Once | INTRAMUSCULAR | Status: AC
  Administered 2014-12-25: 0.5 mg via INTRAVENOUS
  Filled 2014-12-25: qty 1

## 2014-12-25 NOTE — Assessment & Plan Note (Addendum)
She will proceed with cycle #5 of CMF today. She will return in 3 weeks with an office visit, laboratory studies and plan for cycle #6. She has done quite well with treatment and no side effects and symptoms of concern. Goal is to complete 6 cycles of therapy followed by Radiation Therapy.  She has seen Dr. Pablo Ledger in consultation.  I have discontinued Lunesta due to ineffectiveness and cost to the patient.  "Those pills were $50 and didn't work."  I have prescribed Restoril 15- 30 mg capsules at HS.  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  Rx for Kdur 20 mEq BID.  Likely secondary to HCTZ use.  Will treat with Rx and then defer to primary care provider following completion of chemotherapy.  Long discussion regarding Genetic Counseling.  Discussed Pros and Cons of such testing.  She is considering.  Her children and disinterested and resistant to this knowledge.  However, the information will help the patient and possibly other family members based upon results.   Return in 3 weeks for follow-up and final cycle of chemotherapy.

## 2014-12-25 NOTE — Patient Instructions (Signed)
[facility] at [facility] Discharge Instructions  RECOMMENDATIONS MADE BY THE CONSULTANT AND ANY TEST RESULTS WILL BE SENT TO YOUR REFERRING PHYSICIAN.  Exam completed by Kirby Crigler today Chemotherapy as planned today One more treatment in 3 weeks. Return tomorrow for your neualsta injection. Refill on your oxycodone and we give you something for sleep. Please call the clinic if you have any questions or concerns.    Thank you for choosing [facility] at [facility] to provide your oncology and hematology care.  To afford each patient quality time with our provider, please arrive at least 15 minutes before your scheduled appointment time.    You need to re-schedule your appointment should you arrive 10 or more minutes late.  We strive to give you quality time with our providers, and arriving late affects you and other patients whose appointments are after yours.  Also, if you no show three or more times for appointments you may be dismissed from the clinic at the providers discretion.     Again, thank you for choosing [facility]  Our hope is that these requests will decrease the amount of time that you wait before being seen by our physicians.       _____________________________________________________________  Should you have questions after your visit to [facility], please contact our office at [phone] between the hours of 8:30 a.m. and 4:30 p.m.  Voicemails left after 4:30 p.m. will not be returned until the following business day.  For prescription refill requests, have your pharmacy contact our office.

## 2014-12-25 NOTE — Patient Instructions (Signed)
[facility] Discharge Instructions for Patients Receiving Chemotherapy  Today you received the following chemotherapy agents:  Cytoxan, methotrexate, and 5FU. Return tomorrow as scheduled for Neulasta injection. If you develop nausea and vomiting, or diarrhea that is not controlled by your medication, call the clinic.  The clinic phone number is [phone]. Office hours are Monday-Friday 8:30am-5:00pm.  BELOW ARE SYMPTOMS THAT SHOULD BE REPORTED IMMEDIATELY:  *FEVER GREATER THAN 101.0 F  *CHILLS WITH OR WITHOUT FEVER  NAUSEA AND VOMITING THAT IS NOT CONTROLLED WITH YOUR NAUSEA MEDICATION  *UNUSUAL SHORTNESS OF BREATH  *UNUSUAL BRUISING OR BLEEDING  TENDERNESS IN MOUTH AND THROAT WITH OR WITHOUT PRESENCE OF ULCERS  *URINARY PROBLEMS  *BOWEL PROBLEMS  UNUSUAL RASH Items with * indicate a potential emergency and should be followed up as soon as possible. If you have an emergency after office hours please contact your primary care physician or go to the nearest emergency department.  Please call the clinic during office hours if you have any questions or concerns.   You may also contact the Patient Navigator at [phone] should you have any questions or need assistance in obtaining follow up care. _____________________________________________________________________ Have you asked about our STAR program?    STAR stands for Survivorship Training and Rehabilitation, and this is a nationally recognized cancer care program that focuses on survivorship and rehabilitation.  Cancer and cancer treatments may cause problems, such as, pain, making you feel tired and keeping you from doing the things that you need or want to do. Cancer rehabilitation can help. Our goal is to reduce these troubling effects and help you have the best quality of life possible.  You may receive a survey from a nurse that asks questions about your current state of health.  Based on the survey  results, all eligible patients will be referred to the [facility] program for an evaluation so we can better serve you! A frequently asked questions sheet is available upon request.

## 2014-12-25 NOTE — Progress Notes (Signed)
Alonza Bogus, MD [place]  [id number]  Carcinoma of upper-outer quadrant of left female breast - Plan: oxyCODONE-acetaminophen (ROXICET) 5-325 MG per tablet  Insomnia - Plan: temazepam (RESTORIL) 15 MG capsule  Hypokalemia - Plan: potassium chloride SA (K-DUR,KLOR-CON) 20 MEQ tablet  CURRENT THERAPY:  INTERVAL HISTORY: Regina Mcmahon 64 y.o. female returns for followup of Stage IIB, ER/PR + left breast cancer currently undergoing CMF chemotherapy (patient refused Taxane-based therapy).    Carcinoma of upper-outer quadrant of left female breast   06/24/2014 Mammogram Irregular, heterogeneous mass-like area in the 2 o'clock position of the left breast, corresponding to the palpable mass, as described above. This has imaging features suspicious for malignancy.   07/01/2014 Initial Biopsy ER+ 100%, PR+ 91%, Ki-67 32% Invasive mammary carcinoma with lobular features   08/20/2014 Surgery Left breast seed localized lumpectomy and left axilla sentinel lymph node biopsy. Final Staging TNM: pT2, pN1a, pMX.   09/17/2014 Surgery port-a cath placement and re-excision   10/02/2014 -  Chemotherapy CMF.  patient refused Taxane-containing chemotherapy.   I personally reviewed and went over laboratory results with the patient.  The results are noted within this dictation.  Potassium is noted to be low and therefore I have printed an Rx for Kdur 20 mEq BID.  I have been told by nursing that she is noncompliant with this medication.    She was prescribed Lunesta and this was ineffeective for her for sleep.  She completed 1 month worth of the medication.  On questioning, she admits that her sleeping pattern really has not changed since prior to her diagnosis.  She goes to bed around 2 AM and awakes at around 6-7 AM.  She does not feel rested upon waking.  Since Johnnye Sima is not effective for her, I have discontinued the medication and I will try Temazepam 15-30 mg at  HS.  She will let us know the effectiveness of this intervention, but it sounds like her sleeping pattern is stable.    She requests a refill for Oxycodone and she uses this post-neulasta injection for pain.  She has concerns about Genetics Counseling.  "My children don't want to know."  "I am not sure I want to know either."  However, she reviewed her family history with me briefly and she has a significant amount of malignancy at young ages in her family tree.  We discussed the pros and cons of genetic counseling and that depending on results, there may be risk-reductive interventions we could offer.  It is ashame that her children are not interested, but it sounds like it is their naivety regarding the gravity of this situation.    Past Medical History  Diagnosis Date  . Hypertension   . Panic attacks   . Chronic airway obstruction   . Pain     Hx.pain in joint involving pelvic region and thigh; pain in limb  . Sinusitis   . Asthma   . Palpitations   . Hemorrhage rectum     and anus.  . Symptomatic menopausal or female climacteric states   . Asymptomatic varicose veins   . Other symptoms involving digestive system(787.99)   . Depression   . Breast cancer 06/2014    left  . Insomnia     has Anxiety; Blood in stool; Pain; Chronic airway obstruction; Diarrhea; Tobacco use disorder; Unspecified essential hypertension; and Carcinoma of upper-outer quadrant of left female breast on her problem list.  has No Known Allergies.  Current Outpatient Prescriptions on File Prior to Visit  Medication Sig Dispense Refill  . acetaminophen (TYLENOL) 325 MG tablet Take 650 mg by mouth every 6 (six) hours as needed.    Marland Kitchen albuterol (PROVENTIL HFA;VENTOLIN HFA) 108 (90 BASE) MCG/ACT inhaler Inhale 2 puffs into the lungs every 6 (six) hours as needed for wheezing or shortness of breath.    . ALPRAZolam (XANAX) 0.5 MG tablet Take 0.5 mg by mouth 3 (three) times daily as needed for anxiety.    .  calcium carbonate (TUMS - DOSED IN MG ELEMENTAL CALCIUM) 500 MG chewable tablet Chew 1 tablet by mouth 3 (three) times daily with meals.    . Cyclophosphamide (CYTOXAN IJ) Inject as directed. Every 21 days starting 10/02/14    . diphenhydramine-acetaminophen (TYLENOL PM) 25-500 MG TABS Take 2 tablets by mouth at bedtime.    . docusate sodium (COLACE) 100 MG capsule Take 100 mg by mouth 3 (three) times daily as needed for mild constipation.    . fluorouracil in sodium chloride 0.9 % 50 mL Inject into the vein once. Every 21 days starting 10/02/14    . hydrochlorothiazide (MICROZIDE) 12.5 MG capsule Take 12.5 mg by mouth daily.    Marland Kitchen ibuprofen (ADVIL,MOTRIN) 200 MG tablet Take 200 mg by mouth every 6 (six) hours as needed.    . lidocaine-prilocaine (EMLA) cream Apply a quarter size amount to port site 1 hour prior to chemo. Do not rub in. Cover with plastic wrap. 30 g 3  . METHOTREXATE SODIUM IJ Inject as directed. Every 21 days starting 10/02/14    . omeprazole (PRILOSEC) 40 MG capsule Take 1 capsule (40 mg total) by mouth daily. 30 capsule 4  . ondansetron (ZOFRAN) 8 MG tablet Take 1 tablet every 8 hours as needed for nausea/vomiting. 30 tablet 2  . prochlorperazine (COMPAZINE) 10 MG tablet Take 1 tablet (10 mg total) by mouth every 6 (six) hours as needed (Nausea or vomiting). 30 tablet 1  . tiZANidine (ZANAFLEX) 4 MG tablet      Current Facility-Administered Medications on File Prior to Visit  Medication Dose Route Frequency Provider Last Rate Last Dose  . fluorouracil (ADRUCIL) chemo injection 1,250 mg  600 mg/m2 (Treatment Plan Actual) Intravenous Once Patrici Ranks, MD      . heparin lock flush 100 unit/mL  500 Units Intracatheter Once PRN Patrici Ranks, MD      . methotrexate (PF) chemo injection 85 mg  85 mg Intravenous Once Patrici Ranks, MD      . sodium chloride 0.9 % injection 10 mL  10 mL Intracatheter PRN Patrici Ranks, MD   10 mL at 12/25/14 [id number]    Past Surgical  History  Procedure Laterality Date  . Cesarean section    . Cholecystectomy  1985  . Tubal ligation    . Colonoscopy    . Radioactive seed guided mastectomy with axillary sentinel lymph node biopsy Left 08/20/2014    Procedure: LEFT BREAST LUMPECTOMY WITH RADIOACTIVE SEED LOCALIZATION AND SENTINEL LYMPH NODE MAPPING;  Surgeon: Erroll Luna, MD;  Location: [facility];  Service: General;  Laterality: Left;  . Portacath placement Right 09/17/2014    Procedure: INSERTION PORT-A-CATH WITH ULTRASOUND;  Surgeon: Erroll Luna, MD;  Location: [facility];  Service: General;  Laterality: Right;  IJ  . Re-excision of breast lumpectomy Left 09/17/2014    Procedure: RE-EXCISION OF BREAST LUMPECTOMY;  Surgeon: Erroll Luna, MD;  Location:  [facility];  Service: General;  Laterality: Left;    Denies any headaches, dizziness, double vision, fevers, chills, night sweats, nausea, vomiting, diarrhea, constipation, chest pain, heart palpitations, shortness of breath, blood in stool, black tarry stool, urinary pain, urinary burning, urinary frequency, hematuria.   PHYSICAL EXAMINATION  ECOG PERFORMANCE STATUS: 0 - Asymptomatic  Filed Vitals:   12/25/14 0842  BP: 153/69  Pulse: 80  Temp: 98 F (36.7 C)  Resp: 20    GENERAL:alert, no distress, well nourished, well developed, comfortable, cooperative, obese and smiling SKIN: skin color, texture, turgor are normal, no rashes or significant lesions HEAD: Normocephalic, No masses, lesions, tenderness or abnormalities EYES: normal, PERRLA, EOMI, Conjunctiva are pink and non-injected EARS: External ears normal OROPHARYNX:lips, buccal mucosa, and tongue normal and mucous membranes are moist  NECK: supple, no adenopathy, trachea midline LYMPH:  no palpable lymphadenopathy BREAST:not examined LUNGS: clear to auscultation  HEART: regular rate & rhythm ABDOMEN:abdomen soft, non-tender, obese and normal bowel  sounds BACK: Back symmetric, no curvature. EXTREMITIES:less then 2 second capillary refill, no joint deformities, effusion, or inflammation, no skin discoloration, no cyanosis  NEURO: alert & oriented x 3 with fluent speech, no focal motor/sensory deficits, gait normal   LABORATORY DATA: CBC    Component Value Date/Time   WBC 7.3 12/25/2014 0841   RBC 4.94 12/25/2014 0841   HGB 15.6* 12/25/2014 0841   HCT 46.0 12/25/2014 0841   PLT 196 12/25/2014 0841   MCV 93.1 12/25/2014 0841   MCH 31.6 12/25/2014 0841   MCHC 33.9 12/25/2014 0841   RDW 14.4 12/25/2014 0841   LYMPHSABS 1.4 12/25/2014 0841   MONOABS 0.5 12/25/2014 0841   EOSABS 0.3 12/25/2014 0841   BASOSABS 0.0 12/25/2014 0841      Chemistry      Component Value Date/Time   NA 140 12/25/2014 0841   K 3.1* 12/25/2014 0841   CL 100* 12/25/2014 0841   CO2 30 12/25/2014 0841   BUN 19 12/25/2014 0841   CREATININE 0.76 12/25/2014 0841      Component Value Date/Time   CALCIUM 9.5 12/25/2014 0841   ALKPHOS 72 12/25/2014 0841   AST 14* 12/25/2014 0841   ALT 15 12/25/2014 0841   BILITOT 0.9 12/25/2014 0841        ASSESSMENT AND PLAN:  Carcinoma of upper-outer quadrant of left female breast She will proceed with cycle #5 of CMF today. She will return in 3 weeks with an office visit, laboratory studies and plan for cycle #6. She has done quite well with treatment and no side effects and symptoms of concern. Goal is to complete 6 cycles of therapy followed by Radiation Therapy.  She has seen Dr. Pablo Ledger in consultation.  I have discontinued Lunesta due to ineffectiveness and cost to the patient.  "Those pills were $50 and didn't work."  I have prescribed Restoril 15- 30 mg capsules at HS.  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  Rx for Kdur 20 mEq BID.  Likely secondary to HCTZ use.  Will treat with Rx and then defer to primary care provider following completion of chemotherapy.  Long discussion regarding  Genetic Counseling.  Discussed Pros and Cons of such testing.  She is considering.  Her children and disinterested and resistant to this knowledge.  However, the information will help the patient and possibly other family members based upon results.   Return in 3 weeks for follow-up and final cycle of chemotherapy.     THERAPY PLAN:  Complete 6 cycles of CMF, followed by Radiation Therapy.  All questions were answered. The patient knows to call the clinic with any problems, questions or concerns. We can certainly see the patient much sooner if necessary.  Patient and plan discussed with Dr. Ancil Linsey and she is in agreement with the aforementioned.   This note is electronically signed by: Doy Mince 12/25/2014 11:54 AM

## 2014-12-25 NOTE — Progress Notes (Signed)
1215:  Tolerated tx w/o adverse reaction; a&ox4; VSS; discharged ambulatory in c/o sister for transport home.

## 2014-12-26 ENCOUNTER — Encounter (HOSPITAL_BASED_OUTPATIENT_CLINIC_OR_DEPARTMENT_OTHER): Payer: Medicaid Other

## 2014-12-26 ENCOUNTER — Encounter (HOSPITAL_COMMUNITY): Payer: Self-pay

## 2014-12-26 VITALS — BP 137/83 | HR 75 | Temp 98.0°F | Resp 20

## 2014-12-26 DIAGNOSIS — Z5189 Encounter for other specified aftercare: Secondary | ICD-10-CM

## 2014-12-26 DIAGNOSIS — C50412 Malignant neoplasm of upper-outer quadrant of left female breast: Secondary | ICD-10-CM | POA: Diagnosis not present

## 2014-12-26 MED ORDER — PEGFILGRASTIM INJECTION 6 MG/0.6ML ~~LOC~~
PREFILLED_SYRINGE | SUBCUTANEOUS | Status: AC
  Filled 2014-12-26: qty 0.6

## 2014-12-26 MED ORDER — PEGFILGRASTIM INJECTION 6 MG/0.6ML ~~LOC~~
6.0000 mg | PREFILLED_SYRINGE | Freq: Once | SUBCUTANEOUS | Status: AC
  Administered 2014-12-26: 6 mg via SUBCUTANEOUS

## 2014-12-26 NOTE — Progress Notes (Signed)
Regina Mcmahon's reason for visit today is for an injection  as scheduled per MD orders.  Regina Mcmahon also received neulasta injection in sq in abdomen per MD orders; see [facility] for administration details.  Regina Mcmahon tolerated all procedures well and without incident; questions were answered and patient was discharged.

## 2014-12-26 NOTE — Patient Instructions (Signed)
[place] at [facility] Discharge Instructions  RECOMMENDATIONS MADE BY THE CONSULTANT AND ANY TEST RESULTS WILL BE SENT TO YOUR REFERRING PHYSICIAN.  neulasta injection today Follow up as scheduled Please call the clinic if you have any questions or concerns.  Thank you for choosing [place] at [facility] to provide your oncology and hematology care.  To afford each patient quality time with our provider, please arrive at least 15 minutes before your scheduled appointment time.    You need to re-schedule your appointment should you arrive 10 or more minutes late.  We strive to give you quality time with our providers, and arriving late affects you and other patients whose appointments are after yours.  Also, if you no show three or more times for appointments you may be dismissed from the clinic at the providers discretion.     Again, thank you for choosing [facility].  Our hope is that these requests will decrease the amount of time that you wait before being seen by our physicians.       _____________________________________________________________  Should you have questions after your visit to [facility], please contact our office at [phone] between the hours of 8:30 a.m. and 4:30 p.m.  Voicemails left after 4:30 p.m. will not be returned until the following business day.  For prescription refill requests, have your pharmacy contact our office.

## 2015-01-15 ENCOUNTER — Encounter (HOSPITAL_BASED_OUTPATIENT_CLINIC_OR_DEPARTMENT_OTHER): Payer: Medicaid Other | Admitting: Hematology & Oncology

## 2015-01-15 ENCOUNTER — Encounter (HOSPITAL_COMMUNITY): Payer: Self-pay | Admitting: Lab

## 2015-01-15 ENCOUNTER — Encounter (HOSPITAL_BASED_OUTPATIENT_CLINIC_OR_DEPARTMENT_OTHER): Payer: Medicaid Other

## 2015-01-15 ENCOUNTER — Encounter (HOSPITAL_COMMUNITY): Payer: Self-pay | Admitting: Hematology & Oncology

## 2015-01-15 VITALS — BP 136/72 | HR 70 | Temp 97.7°F | Resp 18 | Wt 231.1 lb

## 2015-01-15 DIAGNOSIS — C50912 Malignant neoplasm of unspecified site of left female breast: Secondary | ICD-10-CM

## 2015-01-15 DIAGNOSIS — Z720 Tobacco use: Secondary | ICD-10-CM

## 2015-01-15 DIAGNOSIS — K5900 Constipation, unspecified: Secondary | ICD-10-CM | POA: Diagnosis not present

## 2015-01-15 DIAGNOSIS — G4700 Insomnia, unspecified: Secondary | ICD-10-CM | POA: Diagnosis not present

## 2015-01-15 DIAGNOSIS — Z5111 Encounter for antineoplastic chemotherapy: Secondary | ICD-10-CM

## 2015-01-15 DIAGNOSIS — C50412 Malignant neoplasm of upper-outer quadrant of left female breast: Secondary | ICD-10-CM | POA: Diagnosis present

## 2015-01-15 DIAGNOSIS — F419 Anxiety disorder, unspecified: Secondary | ICD-10-CM

## 2015-01-15 DIAGNOSIS — E876 Hypokalemia: Secondary | ICD-10-CM | POA: Diagnosis not present

## 2015-01-15 LAB — COMPREHENSIVE METABOLIC PANEL
ALT: 16 U/L (ref 14–54)
AST: 16 U/L (ref 15–41)
Albumin: 3.7 g/dL (ref 3.5–5.0)
Alkaline Phosphatase: 75 U/L (ref 38–126)
Anion gap: 7 (ref 5–15)
BUN: 13 mg/dL (ref 6–20)
CO2: 32 mmol/L (ref 22–32)
Calcium: 9.2 mg/dL (ref 8.9–10.3)
Chloride: 101 mmol/L (ref 101–111)
Creatinine, Ser: 0.84 mg/dL (ref 0.44–1.00)
GFR calc Af Amer: >60 mL/min (ref >60)
GFR calc non Af Amer: >60 mL/min (ref >60)
Glucose, Bld: 122 mg/dL — ABNORMAL HIGH (ref 65–99)
Potassium: 4.5 mmol/L (ref 3.5–5.1)
Sodium: 140 mmol/L (ref 135–145)
Total Bilirubin: 1 mg/dL (ref 0.3–1.2)
Total Protein: 6.8 g/dL (ref 6.5–8.1)

## 2015-01-15 LAB — CBC WITH DIFFERENTIAL/PLATELET
Basophils Absolute: 0 10*3/uL (ref 0.0–0.1)
Basophils Relative: 0 % (ref 0–1)
Eosinophils Absolute: 0.2 10*3/uL (ref 0.0–0.7)
Eosinophils Relative: 3 % (ref 0–5)
HCT: 45.7 % (ref 36.0–46.0)
Hemoglobin: 15.4 g/dL — ABNORMAL HIGH (ref 12.0–15.0)
Lymphocytes Relative: 20 % (ref 12–46)
Lymphs Abs: 1.4 10*3/uL (ref 0.7–4.0)
MCH: 31.7 pg (ref 26.0–34.0)
MCHC: 33.7 g/dL (ref 30.0–36.0)
MCV: 94 fL (ref 78.0–100.0)
Monocytes Absolute: 0.4 10*3/uL (ref 0.1–1.0)
Monocytes Relative: 6 % (ref 3–12)
Neutro Abs: 5.1 10*3/uL (ref 1.7–7.7)
Neutrophils Relative %: 71 % (ref 43–77)
Platelets: 184 10*3/uL (ref 150–400)
RBC: 4.86 MIL/uL (ref 3.87–5.11)
RDW: 14.6 % (ref 11.5–15.5)
WBC: 7.1 10*3/uL (ref 4.0–10.5)

## 2015-01-15 MED ORDER — SODIUM CHLORIDE 0.9 % IV SOLN
150.0000 mg | Freq: Once | INTRAVENOUS | Status: AC
  Administered 2015-01-15: 150 mg via INTRAVENOUS
  Filled 2015-01-15: qty 5

## 2015-01-15 MED ORDER — FLUOROURACIL CHEMO INJECTION 2.5 GM/50ML
600.0000 mg/m2 | Freq: Once | INTRAVENOUS | Status: AC
  Administered 2015-01-15: 1250 mg via INTRAVENOUS
  Filled 2015-01-15: qty 25

## 2015-01-15 MED ORDER — LORAZEPAM 2 MG/ML IJ SOLN
0.5000 mg | Freq: Once | INTRAMUSCULAR | Status: AC
  Administered 2015-01-15: 0.5 mg via INTRAVENOUS

## 2015-01-15 MED ORDER — ONDANSETRON HCL 40 MG/20ML IJ SOLN
8.0000 mg | Freq: Once | INTRAMUSCULAR | Status: DC
Start: 2015-01-15 — End: 2015-01-15

## 2015-01-15 MED ORDER — PALONOSETRON HCL INJECTION 0.25 MG/5ML
INTRAVENOUS | Status: AC
  Filled 2015-01-15: qty 5

## 2015-01-15 MED ORDER — SODIUM CHLORIDE 0.9 % IV SOLN
Freq: Once | INTRAVENOUS | Status: AC
  Administered 2015-01-15: 10:00:00 via INTRAVENOUS

## 2015-01-15 MED ORDER — HEPARIN SOD (PORK) LOCK FLUSH 100 UNIT/ML IV SOLN
500.0000 [IU] | Freq: Once | INTRAVENOUS | Status: AC | PRN
  Administered 2015-01-15: 500 [IU]

## 2015-01-15 MED ORDER — METHOTREXATE SODIUM (PF) CHEMO INJECTION 250 MG/10ML
85.0000 mg | Freq: Once | INTRAMUSCULAR | Status: AC
  Administered 2015-01-15: 85 mg via INTRAVENOUS
  Filled 2015-01-15: qty 3.4

## 2015-01-15 MED ORDER — SODIUM CHLORIDE 0.9 % IJ SOLN: 10.0000 mL | INTRAMUSCULAR | Status: DC | PRN

## 2015-01-15 MED ORDER — ALTEPLASE 2 MG IJ SOLR
2.0000 mg | Freq: Once | INTRAMUSCULAR | Status: AC | PRN
Start: 2015-01-15 — End: 2015-01-15
  Administered 2015-01-15: 2 mg

## 2015-01-15 MED ORDER — LORAZEPAM 2 MG/ML IJ SOLN
INTRAMUSCULAR | Status: AC
  Filled 2015-01-15: qty 1

## 2015-01-15 MED ORDER — STERILE WATER FOR INJECTION IJ SOLN
INTRAMUSCULAR | Status: AC
  Filled 2015-01-15: qty 10

## 2015-01-15 MED ORDER — PALONOSETRON HCL INJECTION 0.25 MG/5ML
0.2500 mg | Freq: Once | INTRAVENOUS | Status: AC
  Administered 2015-01-15: 0.25 mg via INTRAVENOUS

## 2015-01-15 MED ORDER — SODIUM CHLORIDE 0.9 % IV SOLN
600.0000 mg/m2 | Freq: Once | INTRAVENOUS | Status: AC
  Administered 2015-01-15: 1260 mg via INTRAVENOUS
  Filled 2015-01-15: qty 50

## 2015-01-15 MED ORDER — DEXAMETHASONE SODIUM PHOSPHATE 10 MG/ML IJ SOLN: 10.0000 mg | Freq: Once | INTRAMUSCULAR | Status: DC

## 2015-01-15 MED ORDER — SODIUM CHLORIDE 0.9 % IV SOLN
Freq: Once | INTRAVENOUS | Status: AC
  Administered 2015-01-15: 8 mg via INTRAVENOUS
  Filled 2015-01-15: qty 4

## 2015-01-15 MED ORDER — ALTEPLASE 2 MG IJ SOLR
INTRAMUSCULAR | Status: AC
  Filled 2015-01-15: qty 2

## 2015-01-15 NOTE — Progress Notes (Signed)
Referral sent to [place].  Records faxed on 6/23

## 2015-01-15 NOTE — Progress Notes (Signed)
LABS DRAWN

## 2015-01-15 NOTE — Progress Notes (Signed)
[facility] Progress Note  Patient Care Team: Sinda Du, MD as PCP - General (Internal Medicine)  CHIEF COMPLAINTS/PURPOSE OF CONSULTATION:  L breast cancer History of L breast core biopsy for microcalcifications in 2014, fibroadenoma. CMF cycle 1 on 10/02/2014 No matching staging information was found for the patient.  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 This has imaging features suspicious for malignancy.   07/01/2014 Initial Biopsy ER+ 100%, PR+ 91%, Ki-67 32% Invasive mammary carcinoma with lobular features   08/20/2014 Surgery Left breast seed localized lumpectomy and left axilla sentinel lymph node biopsy. Final Staging TNM: pT2, pN1a, pMX.   09/17/2014 Surgery port-a cath placement and re-excision   10/02/2014 -  Chemotherapy CMF.  patient refused Taxane-containing chemotherapy.    HISTORY OF PRESENTING ILLNESS:  Regina Mcmahon 64 y.o. 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 She is due for cycle #6 of CMF. She has done remarkably well with treatment.   She is present with her sister today and they are excited that today is her last treatment. Her sister says she is doing okay but has a hard time stay positive. She expresses concern with when she will be getting scans.  She is worried about the cancer spreading; her sister says that she has this "horror" of things going bad.  She is also worried about what her insurance will cover and whether or not radiation will weaken her heart. She prefers radiation at [facility]  She has met with genetic  counseling but has heard no information back.  MEDICAL HISTORY:  Past Medical History  Diagnosis Date  . Hypertension   . Panic attacks   . Chronic airway obstruction   . Pain     Hx.pain in joint involving pelvic region and thigh; pain in limb  . Sinusitis   . Asthma   . Palpitations   . Hemorrhage rectum     and anus.  . Symptomatic menopausal or female climacteric states   . Asymptomatic varicose veins   . Other symptoms involving digestive system(787.99)   . Depression   . Breast cancer 06/2014    left  . Insomnia     SURGICAL HISTORY: Past Surgical History  Procedure Laterality Date  . Cesarean section    . Cholecystectomy  1985  . Tubal ligation    . Colonoscopy    . Radioactive seed guided mastectomy with axillary sentinel lymph node biopsy Left 08/20/2014    Procedure: LEFT BREAST LUMPECTOMY WITH RADIOACTIVE SEED LOCALIZATION AND SENTINEL LYMPH NODE MAPPING;  Surgeon: Erroll Luna, MD;  Location: [place];  Service: General;  Laterality: Left;  . Portacath placement Right 09/17/2014    Procedure: INSERTION PORT-A-CATH WITH ULTRASOUND;  Surgeon: Erroll Luna, MD;  Location: [place];  Service: General;  Laterality: Right;  IJ  . Re-excision of breast lumpectomy Left 09/17/2014    Procedure: RE-EXCISION OF BREAST LUMPECTOMY;  Surgeon: Erroll Luna, MD;  Location: [place];  Service: General;  Laterality: Left;    SOCIAL HISTORY: History  Social History  . Marital Status: Divorced    Spouse Name: N/A  . Number of Children: 2  . Years of Education: N/A   Occupational History  . Not on file.   Social History Main Topics  . Smoking status: Current Every Day Smoker -- 2.00 packs/day    Types: Cigarettes  . Smokeless tobacco: Never Used  . Alcohol Use: No  . Drug Use: No  . Sexual Activity: No   Other Topics Concern  . Not on file   Social History Narrative  Smokes 2 PPD   FAMILY HISTORY: Family  History  Problem Relation Age of Onset  . Diabetes Mother   . Ovarian cancer Mother 72  . Other Father     died in MVA at age 75  . Other Sister     mitral valve disorder  . Melanoma Sister 54    removed from back of leg  . Colon cancer Brother   . Cancer Paternal Aunt     leukemia, bone, or lung cancer  . Alzheimer's disease Maternal Grandmother   . Heart attack Maternal Grandfather   . Heart attack Paternal Grandmother   . COPD Paternal Grandfather   . Emphysema Paternal Grandfather   . Congestive Heart Failure Paternal Aunt   . Stomach cancer Paternal Uncle   . Kidney cancer Maternal Uncle   . Cancer Cousin     dx. teens/dx. 40s  . Cancer Cousin   . Colon cancer Cousin    indicated that her mother is deceased. She indicated that her father is deceased. She indicated that her sister is alive. She indicated that her brother is deceased. She indicated that her maternal grandmother is deceased. She indicated that her maternal grandfather is deceased. She indicated that her paternal grandmother is deceased. She indicated that her paternal grandfather is deceased. She indicated that her daughter is alive. She indicated that her son is alive. She indicated that only one of her two maternal aunts is alive. She indicated that only one of her three maternal uncles is alive. She indicated that only one of her three paternal aunts is alive. She indicated that both of her paternal uncles are deceased. She indicated that both of her cousins are deceased.   ALLERGIES:  has No Known Allergies.  MEDICATIONS:  Current Outpatient Prescriptions  Medication Sig Dispense Refill  . albuterol (PROVENTIL HFA;VENTOLIN HFA) 108 (90 BASE) MCG/ACT inhaler Inhale 2 puffs into the lungs every 6 (six) hours as needed for wheezing or shortness of breath.    . ALPRAZolam (XANAX) 0.5 MG tablet Take 0.5 mg by mouth 3 (three) times daily as needed for anxiety.    . calcium carbonate (TUMS - DOSED IN MG ELEMENTAL  CALCIUM) 500 MG chewable tablet Chew 1 tablet by mouth 3 (three) times daily with meals.    . Cyclophosphamide (CYTOXAN IJ) Inject as directed. Every 21 days starting 10/02/14    . fluorouracil in sodium chloride 0.9 % 50 mL Inject into the vein once. Every 21 days starting 10/02/14    . hydrochlorothiazide (MICROZIDE) 12.5 MG capsule Take 12.5 mg by mouth daily.    Marland Kitchen ibuprofen (ADVIL,MOTRIN) 200 MG tablet Take 200 mg by mouth every 6 (six) hours as needed.    . lidocaine-prilocaine (EMLA) cream Apply a quarter size amount to port site 1 hour prior to chemo. Do not rub in. Cover with plastic wrap. 30 g 3  . METHOTREXATE SODIUM IJ Inject as directed. Every 21 days starting 10/02/14    .  omeprazole (PRILOSEC) 40 MG capsule Take 1 capsule (40 mg total) by mouth daily. 30 capsule 4  . ondansetron (ZOFRAN) 8 MG tablet Take 1 tablet every 8 hours as needed for nausea/vomiting. 30 tablet 2  . oxyCODONE-acetaminophen (ROXICET) 5-325 MG per tablet Take 1 tablet by mouth every 4 (four) hours as needed. 30 tablet 0  . potassium chloride SA (K-DUR,KLOR-CON) 20 MEQ tablet Take 1 tablet (20 mEq total) by mouth 2 (two) times daily. 60 tablet 1  . prochlorperazine (COMPAZINE) 10 MG tablet Take 1 tablet (10 mg total) by mouth every 6 (six) hours as needed (Nausea or vomiting). 30 tablet 1  . temazepam (RESTORIL) 15 MG capsule Take 1-2 capsules (15-30 mg total) by mouth at bedtime as needed for sleep. 60 capsule 0  . tiZANidine (ZANAFLEX) 4 MG tablet     . acetaminophen (TYLENOL) 325 MG tablet Take 650 mg by mouth every 6 (six) hours as needed.    . Alum & Mag Hydroxide-Simeth (MAGIC MOUTHWASH) SOLN Take 5 mLs by mouth 4 (four) times daily as needed for mouth pain. 240 mL 1  . diphenhydramine-acetaminophen (TYLENOL PM) 25-500 MG TABS Take 2 tablets by mouth at bedtime.    . docusate sodium (COLACE) 100 MG capsule Take 100 mg by mouth 3 (three) times daily as needed for mild constipation.     No current  facility-administered medications for this visit.    Review of Systems  Constitutional: Negative for fever, chills, weight loss and malaise/fatigue.  HENT: Negative for congestion, hearing loss, nosebleeds, sore throat and tinnitus.   Eyes: Negative for blurred vision, double vision, pain and discharge.  Respiratory: Negative for cough, hemoptysis, sputum production, shortness of breath and wheezing.   Cardiovascular: Negative for chest pain, palpitations, claudication, leg swelling and PND.  Gastrointestinal: Negative for heartburn, nausea, vomiting, abdominal pain, diarrhea, constipation, blood in stool and melena.  Genitourinary: Negative for dysuria, urgency, frequency and hematuria.  Musculoskeletal: Negative for myalgias, joint pain and falls.  Skin: Negative for itching and rash.  Neurological: Negative for dizziness, tingling, tremors, sensory change, speech change, focal weakness, seizures, loss of consciousness, weakness and headaches.  Endo/Heme/Allergies: Does not bruise/bleed easily.  Psychiatric/Behavioral: Negative for depression, suicidal ideas, memory loss and substance abuse. The patient is not nervous/anxious and does not have insomnia.   14 point review of systems was performed and is negative except as detailed under history of present illness and above   PHYSICAL EXAMINATION: ECOG PERFORMANCE STATUS: 0 - Asymptomatic  Filed Vitals:   01/15/15 0902  BP: 136/72  Pulse: 70  Temp: 97.7 F (36.5 C)  Resp: 18   Filed Weights   01/15/15 0902  Weight: 231 lb 1.6 oz (104.826 kg)     Physical Exam  Constitutional: She is oriented to person, place, and time and well-developed, well-nourished, and in no distress.  HENT:  Head: Normocephalic and atraumatic.  Nose: Nose normal.  Mouth/Throat: Oropharynx is clear and moist. No oropharyngeal exudate.  Eyes: Conjunctivae and EOM are normal. Pupils are equal, round, and reactive to light. Right eye exhibits no discharge.  Left eye exhibits no discharge. No scleral icterus.  Neck: Normal range of motion. Neck supple. No tracheal deviation present. No thyromegaly present.  Cardiovascular: Normal rate, regular rhythm and normal heart sounds.  Exam reveals no gallop and no friction rub.   No murmur heard. Pulmonary/Chest: Effort normal and breath sounds normal. She has no wheezes. She has no rales.  Abdominal: Soft. Bowel sounds are normal.  She exhibits no distension and no mass. There is no tenderness. There is no rebound and no guarding.  Musculoskeletal: Normal range of motion. She exhibits no edema.  Lymphadenopathy:    She has no cervical adenopathy.  Neurological: She is alert and oriented to person, place, and time. She has normal reflexes. No cranial nerve deficit. Gait normal. Coordination normal.  Skin: Skin is warm and dry. No rash noted.  Psychiatric: Mood, memory, affect and judgment normal.  Nursing note and vitals reviewed.   LABORATORY DATA:  I have reviewed the data as listed Lab Results  Component Value Date   WBC 7.1 01/15/2015   HGB 15.4* 01/15/2015   HCT 45.7 01/15/2015   MCV 94.0 01/15/2015   PLT 184 01/15/2015     Chemistry      Component Value Date/Time   NA 140 01/15/2015 0911   K 4.5 01/15/2015 0911   CL 101 01/15/2015 0911   CO2 32 01/15/2015 0911   BUN 13 01/15/2015 0911   CREATININE 0.84 01/15/2015 0911      Component Value Date/Time   CALCIUM 9.2 01/15/2015 0911   ALKPHOS 75 01/15/2015 0911   AST 16 01/15/2015 0911   ALT 16 01/15/2015 0911   BILITOT 1.0 01/15/2015 0911      ASSESSMENT & PLAN:   Stage II, infiltrating ductal carcinoma of the left breast, ER positive, PR positive, HER-2 negative Anxiety Tobacco abuse Insomnia, difficulty falling asleep Constipation  She will complete cycle 6 of therapy today. She has done remarkably well with treatment and I again reinforced this with her. We have had a very hard time keeping her positive to therapy.  Initially we had difficulty even getting her to consider any therapy.   We again addressed the importance of smoking cessation. I gave her several options to help her discontinue smoking. She would like to work on this by herself first.  I recommend milk of magnesia for her constipation should it worsen after this chemotherapy cycle. She has a stool softener she is now routinely taking.  I will get her referred to radiation oncology, she would like to go to [place]. I will see her back in one month to reassess how she was doing. Additional treatment planning will occur after completion of radiation.  Advised to stay active.  This note was signed electronically/    This document serves as a record of services personally performed by Ancil Linsey, MD. It was created on her behalf by Janace Hoard, a trained medical scribe. The creation of this record is based on the scribe's personal observations and the provider's statements to them. This document has been checked and approved by the attending provider.  I have reviewed the above documentation for accuracy and completeness, and I agree with the above.  Kelby Fam.  MD

## 2015-01-15 NOTE — Progress Notes (Signed)
1325:  Tolerated tx w/o adverse reaction. A&Ox4; in no distress. Discharged ambulatory.

## 2015-01-15 NOTE — Patient Instructions (Signed)
[facility] Discharge Instructions for Patients Receiving Chemotherapy  Today you received the following chemotherapy agents:  YOUR LAST TREATMENT of Cytoxan, methotrexate, and 5FU.  Return tomorrow for Neulasta as scheduled.  If you develop nausea and vomiting, or diarrhea that is not controlled by your medication, call the clinic.  The clinic phone number is [phone]. Office hours are Monday-Friday 8:30am-5:00pm.  BELOW ARE SYMPTOMS THAT SHOULD BE REPORTED IMMEDIATELY:  *FEVER GREATER THAN 101.0 F  *CHILLS WITH OR WITHOUT FEVER  NAUSEA AND VOMITING THAT IS NOT CONTROLLED WITH YOUR NAUSEA MEDICATION  *UNUSUAL SHORTNESS OF BREATH  *UNUSUAL BRUISING OR BLEEDING  TENDERNESS IN MOUTH AND THROAT WITH OR WITHOUT PRESENCE OF ULCERS  *URINARY PROBLEMS  *BOWEL PROBLEMS  UNUSUAL RASH Items with * indicate a potential emergency and should be followed up as soon as possible. If you have an emergency after office hours please contact your primary care physician or go to the nearest emergency department.  Please call the clinic during office hours if you have any questions or concerns.   You may also contact the Patient Navigator at [phone] should you have any questions or need assistance in obtaining follow up care. _____________________________________________________________________ Have you asked about our STAR program?    STAR stands for Survivorship Training and Rehabilitation, and this is a nationally recognized cancer care program that focuses on survivorship and rehabilitation.  Cancer and cancer treatments may cause problems, such as, pain, making you feel tired and keeping you from doing the things that you need or want to do. Cancer rehabilitation can help. Our goal is to reduce these troubling effects and help you have the best quality of life possible.  You may receive a survey from a nurse that asks questions about your current state of health.   Based on the survey results, all eligible patients will be referred to the [facility] program for an evaluation so we can better serve you! A frequently asked questions sheet is available upon request.

## 2015-01-15 NOTE — Patient Instructions (Signed)
..  [place] at [facility] Discharge Instructions  RECOMMENDATIONS MADE BY THE CONSULTANT AND ANY TEST RESULTS WILL BE SENT TO YOUR REFERRING PHYSICIAN.  Referral to [facility] for breast radiation  Return to see [place] in 1 month  Congratulations on finishing chemotherapy!!  Thank you for choosing [place] at [facility] to provide your oncology and hematology care.  To afford each patient quality time with our provider, please arrive at least 15 minutes before your scheduled appointment time.    You need to re-schedule your appointment should you arrive 10 or more minutes late.  We strive to give you quality time with our providers, and arriving late affects you and other patients whose appointments are after yours.  Also, if you no show three or more times for appointments you may be dismissed from the clinic at the providers discretion.     Again, thank you for choosing [facility].  Our hope is that these requests will decrease the amount of time that you wait before being seen by our physicians.       _____________________________________________________________  Should you have questions after your visit to [facility], please contact our office at [phone] between the hours of 8:30 a.m. and 4:30 p.m.  Voicemails left after 4:30 p.m. will not be returned until the following business day.  For prescription refill requests, have your pharmacy contact our office.

## 2015-01-16 ENCOUNTER — Encounter (HOSPITAL_BASED_OUTPATIENT_CLINIC_OR_DEPARTMENT_OTHER): Payer: Medicaid Other

## 2015-01-16 ENCOUNTER — Encounter (HOSPITAL_COMMUNITY): Payer: Self-pay

## 2015-01-16 VITALS — BP 126/67 | HR 80 | Temp 98.2°F | Resp 20

## 2015-01-16 DIAGNOSIS — Z5189 Encounter for other specified aftercare: Secondary | ICD-10-CM

## 2015-01-16 DIAGNOSIS — C50412 Malignant neoplasm of upper-outer quadrant of left female breast: Secondary | ICD-10-CM

## 2015-01-16 MED ORDER — PEGFILGRASTIM INJECTION 6 MG/0.6ML ~~LOC~~
6.0000 mg | PREFILLED_SYRINGE | Freq: Once | SUBCUTANEOUS | Status: AC
  Administered 2015-01-16: 6 mg via SUBCUTANEOUS

## 2015-01-16 MED ORDER — PEGFILGRASTIM INJECTION 6 MG/0.6ML ~~LOC~~
PREFILLED_SYRINGE | SUBCUTANEOUS | Status: AC
  Filled 2015-01-16: qty 0.6

## 2015-01-16 NOTE — Progress Notes (Signed)
..  Regina Mcmahon presents today for injection per the provider's orders.  neulasta administration without incident; see MAR for injection details.  Patient tolerated procedure well and without incident.  No questions or complaints noted at this time.

## 2015-01-22 ENCOUNTER — Other Ambulatory Visit (HOSPITAL_COMMUNITY): Payer: Self-pay | Admitting: *Deleted

## 2015-01-22 DIAGNOSIS — C50412 Malignant neoplasm of upper-outer quadrant of left female breast: Secondary | ICD-10-CM

## 2015-01-22 MED ORDER — MAGIC MOUTHWASH: 5.0000 mL | Freq: Four times a day (QID) | ORAL | Status: DC | PRN

## 2015-01-22 NOTE — Progress Notes (Signed)
Prescription for magic mouthwash called to [facility] [place] due to complaints of sores in mouth and throat. Also Dr. Josetta Huddle office to let them know that patient can have port removed.

## 2015-01-23 ENCOUNTER — Encounter (HOSPITAL_COMMUNITY): Payer: Self-pay | Admitting: *Deleted

## 2015-02-02 ENCOUNTER — Ambulatory Visit: Payer: Self-pay | Admitting: Surgery

## 2015-02-02 NOTE — H&P (Signed)
Regina Mcmahon 02/02/2015 10:36 AM Location: [facility] Patient #: [id number] DOB: 06/01/1951 Single / Language: [name] / Race: White Female History of Present Illness [name] A. Doria Fern MD; 02/02/2015 10:54 AM) Patient words: discuss port removal  pt returns after completing chemotherapy and wants her port out. Not sure about radiation therapy and has been discussing this with Dr Pablo Ledger. No other issues.  The patient is a 64 year old female   Problem List/Past Medical Ventura Sellers, [place]; 02/02/2015 10:37 AM) BREAST CANCER, LEFT (174.9  C50.912) POST-OPERATIVE STATE [phone])  Other Problems Ventura Sellers, CMA; 02/02/2015 10:37 AM) Anxiety Disorder Asthma Breast Cancer Cholelithiasis Chronic Obstructive Lung Disease Depression High blood pressure Lump In Breast  Past Surgical History Ventura Sellers, CMA; 02/02/2015 10:37 AM) Breast Biopsy Left. Cesarean Section - 1 Colon Polyp Removal - Colonoscopy Gallbladder Surgery - Laparoscopic  Diagnostic Studies History Ventura Sellers, [place]; 02/02/2015 10:37 AM) Colonoscopy 5-10 years ago Mammogram within last year Pap Smear 1-5 years ago  Allergies Ventura Sellers, CMA; 02/02/2015 10:37 AM) No Known Drug Allergies 07/07/2014  Medication History Ventura Sellers, [place]; 02/02/2015 10:37 AM) Hydrochlorothiazide (12.'5MG'$  Capsule, Oral) Active. Lidocaine-Prilocaine (2.5-2.5% Cream, External) Active. Omeprazole ('40MG'$  Capsule DR, Oral) Active. Ondansetron HCl ('8MG'$  Tablet, Oral) Active. Prochlorperazine Maleate ('10MG'$  Tablet, Oral) Active. TiZANidine HCl ('4MG'$  Tablet, Oral) Active. Oxycodone-Acetaminophen (2.5-'325MG'$  Tablet, Oral as needed) Active. Albuterol Sulfate ER ('8MG'$  Tablet ER 12HR, Oral) Active. Cytoxan Lyophilized ('100MG'$  For Solution, Intravenous) Active. Hydrocodone-Acetaminophen (5-'325MG'$  Tablet, Oral) Active. Ventolin HFA (108 (90 Base)MCG/ACT Aerosol Soln,  Inhalation) Active. ALPRAZolam (0.'5MG'$  Tablet, Oral) Active. Lisinopril-Hydrochlorothiazide (10-12.'5MG'$  Tablet, Oral) Active.  Social History Ventura Sellers, [place]; 02/02/2015 10:37 AM) Caffeine use Carbonated beverages. No alcohol use No drug use Tobacco use Current every day smoker.  Family History Ventura Sellers, [place]; 02/02/2015 10:37 AM) Colon Cancer Brother. Diabetes Mellitus Mother. Heart disease in female family member before age 65 Heart disease in female family member before age 67 Ovarian Cancer Mother.  Pregnancy / Birth History Ventura Sellers, [place]; 02/02/2015 10:37 AM) Age at menarche 4 years. Age of menopause <47 Contraceptive History Oral contraceptives. Gravida 2 Irregular periods Maternal age 28-20 Para 2    Vitals ([place]. Brooks CMA; 02/02/2015 10:37 AM) 02/02/2015 10:36 AM Weight: 233.25 lb Height: 63in Body Surface Area: 2.17 m Body Mass Index: 41.32 kg/m BP: 142/86 (Sitting, Left Arm, Standard)     Physical Exam (Hennie Gosa A. Cammie Faulstich MD; 02/02/2015 10:55 AM)  General Mental Status-Alert. General Appearance-Consistent with stated age. Hydration-Well hydrated. Voice-Normal.  Eye Eyeball - Bilateral-Extraocular movements intact. Sclera/Conjunctiva - Bilateral-No scleral icterus.  Chest and Lung Exam Note: port on right side intact   Neurologic Neurologic evaluation reveals -alert and oriented x 3 with no impairment of recent or remote memory. Mental Status-Normal.  Musculoskeletal Normal Exam - Left-Upper Extremity Strength Normal and Lower Extremity Strength Normal. Normal Exam - Right-Upper Extremity Strength Normal and Lower Extremity Strength Normal.    Assessment & Plan (Draedyn Weidinger A. Keitra Carusone MD; 02/02/2015 10:56 AM)  HISTORY OF BREAST CANCER (V10.3  Z85.3) Impression: stable not sure about radiation therapy I encouraged he r to talk with Dr Pablo Ledger.  POST-OPERATIVE STATE  [phone]) Impression: will set up removal. risk of bleeding infection migration and embolization.

## 2015-02-07 ENCOUNTER — Encounter (HOSPITAL_COMMUNITY): Payer: Self-pay | Admitting: Hematology & Oncology

## 2015-02-13 ENCOUNTER — Encounter (HOSPITAL_COMMUNITY): Payer: Self-pay | Admitting: Hematology & Oncology

## 2015-02-13 ENCOUNTER — Encounter (HOSPITAL_COMMUNITY): Payer: Medicaid Other | Attending: Oncology | Admitting: Hematology & Oncology

## 2015-02-13 VITALS — BP 118/72 | HR 74 | Temp 98.2°F | Resp 20 | Wt 234.7 lb

## 2015-02-13 DIAGNOSIS — Z720 Tobacco use: Secondary | ICD-10-CM | POA: Diagnosis not present

## 2015-02-13 DIAGNOSIS — C50919 Malignant neoplasm of unspecified site of unspecified female breast: Secondary | ICD-10-CM | POA: Diagnosis present

## 2015-02-13 DIAGNOSIS — C50412 Malignant neoplasm of upper-outer quadrant of left female breast: Secondary | ICD-10-CM | POA: Insufficient documentation

## 2015-02-13 DIAGNOSIS — E876 Hypokalemia: Secondary | ICD-10-CM | POA: Insufficient documentation

## 2015-02-13 DIAGNOSIS — R319 Hematuria, unspecified: Secondary | ICD-10-CM | POA: Insufficient documentation

## 2015-02-13 NOTE — Progress Notes (Signed)
[facility] Progress Note  Patient Care Team: Sinda Du, MD as PCP - General (Internal Medicine)  CHIEF COMPLAINTS/PURPOSE OF CONSULTATION:  L breast cancer History of L breast core biopsy for microcalcifications in 2014, fibroadenoma. CMF cycle 1 on 10/02/2014 No matching staging information was found for the patient.  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 This has imaging features suspicious for malignancy.   07/01/2014 Initial Biopsy ER+ 100%, PR+ 91%, Ki-67 32% Invasive mammary carcinoma with lobular features   08/20/2014 Surgery Left breast seed localized lumpectomy and left axilla sentinel lymph node biopsy. Final Staging TNM: pT2, pN1a, pMX.   09/17/2014 Surgery port-a cath placement and re-excision   10/02/2014 -  Chemotherapy CMF.  patient refused Taxane-containing chemotherapy.    HISTORY OF PRESENTING ILLNESS:  Regina Mcmahon 64 y.o. 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 She has completed chemotherapy without any significant complication.  The patient is in a great mood and says that she has been doing fine.  She is getting her port out on August 18 and will begin radiation done at [facility] on September 1.  She questions how radiation will make her feel.  She admits that she has been smoking more.   She would like to quit but says that she needs help doing this.  She has never had a screening colonoscopy. She denies any other major complaints or concerns.  MEDICAL HISTORY:  Past Medical History  Diagnosis Date  . Hypertension     . Panic attacks   . Chronic airway obstruction   . Pain     Hx.pain in joint involving pelvic region and thigh; pain in limb  . Sinusitis   . Asthma   . Palpitations   . Hemorrhage rectum     and anus.  . Symptomatic menopausal or female climacteric states   . Asymptomatic varicose veins   . Other symptoms involving digestive system(787.99)   . Depression   . Breast cancer 06/2014    left  . Insomnia     SURGICAL HISTORY: Past Surgical History  Procedure Laterality Date  . Cesarean section    . Cholecystectomy  1985  . Tubal ligation    . Colonoscopy    . Radioactive seed guided mastectomy with axillary sentinel lymph node biopsy Left 08/20/2014    Procedure: LEFT BREAST LUMPECTOMY WITH RADIOACTIVE SEED LOCALIZATION AND SENTINEL LYMPH NODE MAPPING;  Surgeon: Erroll Luna, MD;  Location: [place];  Service: General;  Laterality: Left;  . Portacath placement Right 09/17/2014    Procedure: INSERTION PORT-A-CATH WITH ULTRASOUND;  Surgeon: Erroll Luna, MD;  Location: [place];  Service: General;  Laterality: Right;  IJ  . Re-excision of breast lumpectomy Left 09/17/2014    Procedure: RE-EXCISION OF BREAST LUMPECTOMY;  Surgeon: Erroll Luna, MD;  Location: [place];  Service: General;  Laterality: Left;    SOCIAL HISTORY: History   Social History  . Marital Status: Divorced    Spouse Name: N/A  . Number of Children: 2  .  Years of Education: N/A   Occupational History  . Not on file.   Social History Main Topics  . Smoking status: Current Every Day Smoker -- 2.00 packs/day    Types: Cigarettes  . Smokeless tobacco: Never Used  . Alcohol Use: No  . Drug Use: No  . Sexual Activity: No   Other Topics Concern  . Not on file   Social History Narrative  Smokes 2 PPD   FAMILY HISTORY: Family History  Problem Relation Age of Onset  . Diabetes Mother   . Ovarian cancer Mother 27  . Other Father     died in MVA  at age 14  . Other Sister     mitral valve disorder  . Melanoma Sister 54    removed from back of leg  . Colon cancer Brother   . Cancer Paternal Aunt     leukemia, bone, or lung cancer  . Alzheimer's disease Maternal Grandmother   . Heart attack Maternal Grandfather   . Heart attack Paternal Grandmother   . COPD Paternal Grandfather   . Emphysema Paternal Grandfather   . Congestive Heart Failure Paternal Aunt   . Stomach cancer Paternal Uncle   . Kidney cancer Maternal Uncle   . Cancer Cousin     dx. teens/dx. 40s  . Cancer Cousin   . Colon cancer Cousin    indicated that her mother is deceased. She indicated that her father is deceased. She indicated that her sister is alive. She indicated that her brother is deceased. She indicated that her maternal grandmother is deceased. She indicated that her maternal grandfather is deceased. She indicated that her paternal grandmother is deceased. She indicated that her paternal grandfather is deceased. She indicated that her daughter is alive. She indicated that her son is alive. She indicated that only one of her two maternal aunts is alive. She indicated that only one of her three maternal uncles is alive. She indicated that only one of her three paternal aunts is alive. She indicated that both of her paternal uncles are deceased. She indicated that both of her cousins are deceased.   ALLERGIES:  has No Known Allergies.  MEDICATIONS:  Current Outpatient Prescriptions  Medication Sig Dispense Refill  . acetaminophen (TYLENOL) 325 MG tablet Take 650 mg by mouth every 6 (six) hours as needed.    Marland Kitchen albuterol (PROVENTIL HFA;VENTOLIN HFA) 108 (90 BASE) MCG/ACT inhaler Inhale 2 puffs into the lungs every 6 (six) hours as needed for wheezing or shortness of breath.    . ALPRAZolam (XANAX) 0.5 MG tablet Take 0.5 mg by mouth 3 (three) times daily as needed for anxiety.    . calcium carbonate (TUMS - DOSED IN MG ELEMENTAL CALCIUM) 500 MG chewable  tablet Chew 1 tablet by mouth 3 (three) times daily with meals.    . hydrochlorothiazide (MICROZIDE) 12.5 MG capsule Take 12.5 mg by mouth daily.    Marland Kitchen ibuprofen (ADVIL,MOTRIN) 200 MG tablet Take 200 mg by mouth every 6 (six) hours as needed.    . lidocaine-prilocaine (EMLA) cream Apply a quarter size amount to port site 1 hour prior to chemo. Do not rub in. Cover with plastic wrap. 30 g 3  . omeprazole (PRILOSEC) 40 MG capsule Take 1 capsule (40 mg total) by mouth daily. 30 capsule 4  . potassium chloride SA (K-DUR,KLOR-CON) 20 MEQ tablet Take 1 tablet (20 mEq total) by mouth 2 (two) times daily. 60 tablet 1  . temazepam (RESTORIL) 15 MG capsule Take  1-2 capsules (15-30 mg total) by mouth at bedtime as needed for sleep. 60 capsule 0  . tiZANidine (ZANAFLEX) 4 MG tablet 4 mg as needed.     . Alum & Mag Hydroxide-Simeth (MAGIC MOUTHWASH) SOLN Take 5 mLs by mouth 4 (four) times daily as needed for mouth pain. (Patient not taking: Reported on 02/13/2015) 240 mL 1  . diphenhydramine-acetaminophen (TYLENOL PM) 25-500 MG TABS Take 2 tablets by mouth at bedtime.    . docusate sodium (COLACE) 100 MG capsule Take 100 mg by mouth 3 (three) times daily as needed for mild constipation.    . ondansetron (ZOFRAN) 8 MG tablet Take 1 tablet every 8 hours as needed for nausea/vomiting. (Patient not taking: Reported on 02/13/2015) 30 tablet 2  . prochlorperazine (COMPAZINE) 10 MG tablet Take 1 tablet (10 mg total) by mouth every 6 (six) hours as needed (Nausea or vomiting). (Patient not taking: Reported on 02/13/2015) 30 tablet 1   No current facility-administered medications for this visit.    Review of Systems  Constitutional: Negative for fever, chills, weight loss and malaise/fatigue.  HENT: Negative for congestion, hearing loss, nosebleeds, sore throat and tinnitus.   Eyes: Negative for blurred vision, double vision, pain and discharge.  Respiratory: Negative for cough, hemoptysis, sputum production, shortness  of breath and wheezing.   Cardiovascular: Negative for chest pain, palpitations, claudication, leg swelling and PND.  Gastrointestinal: Negative for heartburn, nausea, vomiting, abdominal pain, diarrhea, constipation, blood in stool and melena.  Genitourinary: Negative for dysuria, urgency, frequency and hematuria.  Musculoskeletal: Negative for myalgias, joint pain and falls.  Skin: Negative for itching and rash.  Neurological: Negative for dizziness, tingling, tremors, sensory change, speech change, focal weakness, seizures, loss of consciousness, weakness and headaches.  Endo/Heme/Allergies: Does not bruise/bleed easily.  Psychiatric/Behavioral: Negative for depression, suicidal ideas, memory loss and substance abuse. The patient is not nervous/anxious and does not have insomnia.   14 point review of systems was performed and is negative except as detailed under history of present illness and above   PHYSICAL EXAMINATION: ECOG PERFORMANCE STATUS: 0 - Asymptomatic  Filed Vitals:   02/13/15 1000  BP: 118/72  Pulse: 74  Temp: 98.2 F (36.8 C)  Resp: 20   Filed Weights   02/13/15 1000  Weight: 234 lb 11.2 oz (106.459 kg)   Physical Exam  Constitutional: She is oriented to person, place, and time and well-developed, well-nourished, and in no distress.  HENT:  Head: Normocephalic and atraumatic.  Nose: Nose normal.  Mouth/Throat: Oropharynx is clear and moist. No oropharyngeal exudate.  Eyes: Conjunctivae and EOM are normal. Pupils are equal, round, and reactive to light. Right eye exhibits no discharge. Left eye exhibits no discharge. No scleral icterus.  Neck: Normal range of motion. Neck supple. No tracheal deviation present. No thyromegaly present.  Cardiovascular: Normal rate, regular rhythm and normal heart sounds.  Exam reveals no gallop and no friction rub.   No murmur heard. Pulmonary/Chest: Effort normal and breath sounds normal. She has no wheezes. She has no rales.   Abdominal: Soft. Bowel sounds are normal. She exhibits no distension and no mass. There is no tenderness. There is no rebound and no guarding.  Musculoskeletal: Normal range of motion. She exhibits no edema.  Breasts: L breast well healed surgical incision site, minimal scar tissue  Lymphadenopathy:    She has no cervical adenopathy.  Neurological: She is alert and oriented to person, place, and time. She has normal reflexes. No cranial nerve deficit. Gait  normal. Coordination normal.  Skin: Skin is warm and dry. No rash noted.  Psychiatric: Mood, memory, affect and judgment normal.  Nursing note and vitals reviewed.   LABORATORY DATA:  I have reviewed the data as listed Lab Results  Component Value Date   WBC 7.1 01/15/2015   HGB 15.4* 01/15/2015   HCT 45.7 01/15/2015   MCV 94.0 01/15/2015   PLT 184 01/15/2015     Chemistry      Component Value Date/Time   NA 140 01/15/2015 0911   K 4.5 01/15/2015 0911   CL 101 01/15/2015 0911   CO2 32 01/15/2015 0911   BUN 13 01/15/2015 0911   CREATININE 0.84 01/15/2015 0911      Component Value Date/Time   CALCIUM 9.2 01/15/2015 0911   ALKPHOS 75 01/15/2015 0911   AST 16 01/15/2015 0911   ALT 16 01/15/2015 0911   BILITOT 1.0 01/15/2015 0911      ASSESSMENT & PLAN:  Stage II, infiltrating ductal carcinoma of the left breast, ER positive, PR positive, HER-2 negative Anxiety Tobacco abuse Insomnia, difficulty falling asleep Constipation  She has completed chemotherapy and is getting ready to start radiation. She was insistent on getting her Port-A-Cath out prior to the start of radiation. Unfortunately this is delaying the start of radiation by several weeks.   We again addressed the importance of smoking cessation. I gave her several options to help her discontinue smoking. She would like to work on this by herself first. We will address this again at follow-up  She has never had a screening colonoscopy and I advised her that we  will refer her in the near future once XRT is complete and she has recovered.  We briefly addressed endocrine therapy today. She has decided to proceed with genetic testing and we will arrange for blood work when genetics is back at [facility]. I will see her back in 4-6 weeks at which time we will order a bone density and do additional teaching on endocrine therapy.  This note was signed electronically.    This document serves as a record of services personally performed by Ancil Linsey, MD. It was created on her behalf by Janace Hoard, a trained medical scribe. The creation of this record is based on the scribe's personal observations and the provider's statements to them. This document has been checked and approved by the attending provider.  I have reviewed the above documentation for accuracy and completeness, and I agree with the above.  Kelby Fam. Mahati Vajda MD

## 2015-02-13 NOTE — Patient Instructions (Signed)
[place] at [facility] Discharge Instructions  RECOMMENDATIONS MADE BY THE CONSULTANT AND ANY TEST RESULTS WILL BE SENT TO YOUR REFERRING PHYSICIAN.  Exam and discussion by Dr. Whitney Muse. Will get you scheduled for the blood test when the genetics counselor comes back to [facility].  We will contact you. Report any new lumps, bone pain, shortness of breath or other symptoms. Will see you back in 7 weeks to discuss further treatment.  Thank you for choosing [place] at [facility] to provide your oncology and hematology care.  To afford each patient quality time with our provider, please arrive at least 15 minutes before your scheduled appointment time.    You need to re-schedule your appointment should you arrive 10 or more minutes late.  We strive to give you quality time with our providers, and arriving late affects you and other patients whose appointments are after yours.  Also, if you no show three or more times for appointments you may be dismissed from the clinic at the providers discretion.     Again, thank you for choosing [facility].  Our hope is that these requests will decrease the amount of time that you wait before being seen by our physicians.       _____________________________________________________________  Should you have questions after your visit to [facility], please contact our office at [phone] between the hours of 8:30 a.m. and 4:30 p.m.  Voicemails left after 4:30 p.m. will not be returned until the following business day.  For prescription refill requests, have your pharmacy contact our office.

## 2015-02-17 ENCOUNTER — Encounter (HOSPITAL_BASED_OUTPATIENT_CLINIC_OR_DEPARTMENT_OTHER): Payer: Medicaid Other

## 2015-02-17 DIAGNOSIS — C50412 Malignant neoplasm of upper-outer quadrant of left female breast: Secondary | ICD-10-CM

## 2015-02-17 NOTE — Progress Notes (Signed)
LABS DRAWN FOR GENETIC TESTING

## 2015-02-23 HISTORY — PX: PORT-A-CATH REMOVAL: SHX5289

## 2015-03-11 ENCOUNTER — Telehealth: Payer: Self-pay | Admitting: Genetic Counselor

## 2015-03-11 NOTE — Telephone Encounter (Signed)
Discussed with Regina Mcmahon that her genetic test results were negative for mutations within any of 32 genes associated with increased risks for breast, ovarian, colon, and other types of cancer.  Additionally, no uncertain changes were found.  We still do not have an explanation for the history of cancer.  It could be that we just do not have the ability to test for whatever hereditary cancer syndrome might be in her family--hopefully testing will improve with time and she is welcome to check back then.  It could also be that there is something genetic in the family that she did not inherit.  Her two nieces would be good candidates for genetic testing since her brother passed away from colon cancer at the age of 76.  We also reviewed that most cancers are not genetic.  Screening for her and her family members would be based upon the family history of cancer.  Some family members (herself, her sister, and her two nieces) will need to have colonoscopies every 5 years or more often with positive findings.  Her nieces should begin this screening at 31.  Women in the family would be at a somewhat increased risk for breast cancer based on the history and should begin mammograms by 40--this includes Regina Mcmahon's daughter.

## 2015-03-12 ENCOUNTER — Encounter (HOSPITAL_COMMUNITY): Payer: Medicaid Other

## 2015-03-20 ENCOUNTER — Ambulatory Visit: Payer: Self-pay | Admitting: Genetic Counselor

## 2015-03-20 DIAGNOSIS — Z8041 Family history of malignant neoplasm of ovary: Secondary | ICD-10-CM

## 2015-03-20 DIAGNOSIS — Z800 Family history of malignant neoplasm of digestive organs: Secondary | ICD-10-CM

## 2015-03-20 DIAGNOSIS — Z809 Family history of malignant neoplasm, unspecified: Secondary | ICD-10-CM

## 2015-03-20 DIAGNOSIS — Z1379 Encounter for other screening for genetic and chromosomal anomalies: Secondary | ICD-10-CM | POA: Insufficient documentation

## 2015-03-20 DIAGNOSIS — C50412 Malignant neoplasm of upper-outer quadrant of left female breast: Secondary | ICD-10-CM

## 2015-03-20 NOTE — Progress Notes (Signed)
GENETIC TEST RESULTS  HPI: Ms. Duey was previously seen in the [facility] at [facility]  due to a personal history of breast cancer, family history of ovarian, colon, and other cancers, and concerns regarding a hereditary predisposition to cancer. Please refer to our prior cancer genetics clinic note from Dec 23, 2014 for more information regarding Ms. Kalbfleisch's medical, social and family histories, and our assessment and recommendations, at the time. Ms. Toutant recent genetic test results were disclosed to her, as were recommendations warranted by these results. These results and recommendations are discussed in more detail below.  GENETIC TEST RESULTS: At the time of Ms. Belko's visit on 12/23/14, we recommended she pursue genetic testing of the 32-gene Comprehensive Cancer Panel.  The Comprehensive Cancer Panel offered by [facility] includes sequencing and deletion/duplication testing of the following 30 genes: APC, ATM, AXIN2, BARD1, BMPR1A, BRCA1, BRCA2, BRIP1, CDH1, CDK4, CDKN2A, CHEK2, FANCC, MLH1, MSH2, MSH6, MUTYH, NBN, PALB2, PMS2, POLD1, POLE, PTEN, RAD51C, RAD51D, SMAD4, STK11, TP53, VHL, and XRCC2.  This panel also includes deletion/duplication analysis (without sequencing) for two genes, EPCAM and SCG5/GREM1. At the time, Ms. Propps decided that she needed to think more about genetic testing, so she came back in to give consent for testing and for a blood draw at a future date.  Those test results are now back. The report date for which is March 07, 2015.  Genetic testing was normal, and did not reveal a deleterious mutation in these genes.  Additionally, no variants of uncertain significance were found.  The test report has been scanned into EPIC and is located under the Media tab.   We discussed with Ms. Hemmingway that since the current genetic testing is not perfect, it is possible there may be a gene mutation in one of these genes that current testing cannot  detect, but that chance is small. We also discussed, that it is possible that another gene that has not yet been discovered, or that we have not yet tested, is responsible for the cancer diagnoses in the family, and it is, therefore, important to remain in touch with cancer genetics in the future so that we can continue to offer Ms. Kasprzak the most up to date genetic testing.   CANCER SCREENING RECOMMENDATIONS: We still do not have an explanation for the personal and family history of cancer, it could be that this result is reassuring and indicates that Ms. Stegner likely does not have an increased risk for a future cancer due to a mutation in one of these genes.  However, given the number of family members affected with cancer in the family, it could be that we just do not have the ability to test for whatever hereditary cancer syndrome might be in her family.  Hopefully testing will improve with time and she is welcome to check back then.  It could also be the case that there is something genetic in the family that she did not inherit.  Thus, this negative results gives [place] less information at this point in time.  We, therefore, recommended she continue to follow the cancer management and screening guidelines provided by her oncology and primary healthcare providers.   RECOMMENDATIONS FOR FAMILY MEMBERS: Women in this family might be at some increased risk of developing cancer, over the general population risk, simply due to the family history of cancer. We recommended women in this family have a yearly mammogram beginning at age 82, or 71 years younger  than the earliest onset of cancer, an an annual clinical breast exam, and perform monthly breast self-exams. This would include Ms. Steier's daughter.  Women in this family should also have a gynecological exam as recommended by their primary provider. All family members should have a colonoscopy by age 19.  We discussed that it is important for Ms. Nehme's sister  and daughter to make their doctors aware of the family history of cancer, particularly the history of breast, ovarian and colon cancers, so that they may receive the most updated cancer screening in the future.  Some family members (Ms. Baskerville, her sister, and her two nieces) will need to have colonoscopies every 5 years or more often with positive findings.  Her nieces should begin this screening at 44.    Based on Ms. Spanbauer's family history, we recommended her two nieces, the daughters of her brother who died of colon cancer at 77, have genetic counseling and testing.  Their genetic testing may potentially enlighten [place] as to Ms. Caserta's own cancer risks.  Ms. Maugeri will let us know if we can be of any assistance in coordinating genetic counseling and/or testing for these family members.   FOLLOW-UP: Lastly, we discussed with Ms. Giarratano that cancer genetics is a rapidly advancing field and it is possible that new genetic tests will be appropriate for her and/or her family members in the future. We encouraged her to remain in contact with cancer genetics on an annual basis so we can update her personal and family histories and let her know of advances in cancer genetics that may benefit this family.   Our contact number was provided. Ms. Berkery questions were answered to her satisfaction, and she knows she is welcome to call us at anytime with additional questions or concerns.   Jeanine Luz MS Genetic Counselor [email or web address] Phone: [phone]

## 2015-03-26 ENCOUNTER — Ambulatory Visit
Admission: RE | Admit: 2015-03-26 | Discharge: 2015-03-26 | Disposition: A | Discharge status: Home / Self Care | Payer: Medicaid Other | Source: Ambulatory Visit | Attending: Radiation Oncology | Admitting: Radiation Oncology

## 2015-03-26 DIAGNOSIS — C50412 Malignant neoplasm of upper-outer quadrant of left female breast: Secondary | ICD-10-CM | POA: Insufficient documentation

## 2015-03-26 NOTE — Progress Notes (Signed)
Name: Regina Mcmahon   MRN: [id number]  Date:  03/26/2015  DOB: 1951/01/06  Status:outpatient    DIAGNOSIS: Breast cancer.  CONSENT VERIFIED: yes   SET UP: Patient is setup supine   IMMOBILIZATION:  The following immobilization was used:Custom Moldable Pillow, breast board.   NARRATIVE: [name] was brought to the [facility].  Identity was confirmed.  All relevant records and images related to the planned course of therapy were reviewed.  Then, the patient was positioned in a stable reproducible clinical set-up for radiation therapy.  Wires were placed to delineate the clinical extent of breast tissue. A wire was placed on the scar as well.  CT images were obtained.  An isocenter was placed. Skin markings were placed.  The CT images were loaded into the planning software where the target and avoidance structures were contoured.  The radiation prescription was entered and confirmed. The patient was discharged in stable condition and tolerated simulation well.    TREATMENT PLANNING NOTE:  Treatment planning then occurred. I have requested : MLC's, isodose plan, basic dose calculation  I personally designed and supervised the construction of 5 medically necessary complex treatment devices for the protection of critical normal structures including the lungs and contralateral breast as well as the immobilization device which is necessary for set up certainty.   TREATMENT PLANNING NOTE/3D Simulation Note Treatment planning then occurred. I have requested : MLC's, isodose plan, basic dose calculation  3D simulation was performed.  I personally constructed 6 complex treatment devices in the form of MLCs which will be used for beam modification and to protect critical structures including the heart and lung.  I have requested a dose volume histogram of the heart lung and tumor cavity.   This document serves as a record of services personally performed by Thea Silversmith, MD. It  was created on her behalf by Darcus Austin, a trained medical scribe. The creation of this record is based on the scribe's personal observations and the provider's statements to them. This document has been checked and approved by the attending provider.

## 2015-03-27 ENCOUNTER — Encounter (HOSPITAL_COMMUNITY): Payer: Self-pay

## 2015-04-02 DIAGNOSIS — C50412 Malignant neoplasm of upper-outer quadrant of left female breast: Secondary | ICD-10-CM | POA: Diagnosis not present

## 2015-04-03 ENCOUNTER — Ambulatory Visit (HOSPITAL_COMMUNITY): Payer: Medicaid Other | Admitting: Hematology & Oncology

## 2015-04-03 ENCOUNTER — Ambulatory Visit
Admission: RE | Admit: 2015-04-03 | Discharge: 2015-04-03 | Disposition: A | Discharge status: Home / Self Care | Payer: Medicaid Other | Source: Ambulatory Visit | Attending: Radiation Oncology | Admitting: Radiation Oncology

## 2015-04-03 DIAGNOSIS — C50412 Malignant neoplasm of upper-outer quadrant of left female breast: Secondary | ICD-10-CM | POA: Diagnosis not present

## 2015-04-04 NOTE — Progress Notes (Signed)
This encounter was created in error - please disregard.  This encounter was created in error - please disregard.

## 2015-04-06 ENCOUNTER — Ambulatory Visit
Admission: RE | Admit: 2015-04-06 | Discharge: 2015-04-06 | Disposition: A | Discharge status: Home / Self Care | Payer: Medicaid Other | Source: Ambulatory Visit | Attending: Radiation Oncology | Admitting: Radiation Oncology

## 2015-04-06 DIAGNOSIS — C50412 Malignant neoplasm of upper-outer quadrant of left female breast: Secondary | ICD-10-CM | POA: Diagnosis not present

## 2015-04-07 ENCOUNTER — Ambulatory Visit
Admission: RE | Admit: 2015-04-07 | Discharge: 2015-04-07 | Disposition: A | Discharge status: Home / Self Care | Payer: Medicaid Other | Source: Ambulatory Visit | Attending: Radiation Oncology | Admitting: Radiation Oncology

## 2015-04-07 DIAGNOSIS — C50412 Malignant neoplasm of upper-outer quadrant of left female breast: Secondary | ICD-10-CM | POA: Diagnosis not present

## 2015-04-08 ENCOUNTER — Ambulatory Visit
Admission: RE | Admit: 2015-04-08 | Discharge: 2015-04-08 | Disposition: A | Discharge status: Home / Self Care | Payer: Medicaid Other | Source: Ambulatory Visit | Attending: Radiation Oncology | Admitting: Radiation Oncology

## 2015-04-08 ENCOUNTER — Ambulatory Visit: Payer: Medicaid Other | Attending: Radiation Oncology

## 2015-04-08 DIAGNOSIS — C50412 Malignant neoplasm of upper-outer quadrant of left female breast: Secondary | ICD-10-CM | POA: Diagnosis not present

## 2015-04-09 ENCOUNTER — Encounter: Payer: Self-pay | Admitting: Radiation Oncology

## 2015-04-09 ENCOUNTER — Ambulatory Visit
Admission: RE | Admit: 2015-04-09 | Discharge: 2015-04-09 | Disposition: A | Discharge status: Home / Self Care | Payer: Medicaid Other | Source: Ambulatory Visit | Attending: Radiation Oncology | Admitting: Radiation Oncology

## 2015-04-09 VITALS — BP 145/85 | HR 69 | Resp 16 | Wt 241.7 lb

## 2015-04-09 DIAGNOSIS — C50412 Malignant neoplasm of upper-outer quadrant of left female breast: Secondary | ICD-10-CM

## 2015-04-09 MED ORDER — RADIAPLEXRX EX GEL
Freq: Once | CUTANEOUS | Status: AC
  Administered 2015-04-09: 14:00:00 via TOPICAL

## 2015-04-09 MED ORDER — ALRA NON-METALLIC DEODORANT (RAD-ONC)
1.0000 "application " | Freq: Once | TOPICAL | Status: AC
  Administered 2015-04-09: 1 via TOPICAL

## 2015-04-09 NOTE — Addendum Note (Signed)
Encounter addended by: Norm Salt, RN on: 04/09/2015  5:53 PM<BR>     Documentation filed: Medications

## 2015-04-09 NOTE — Progress Notes (Signed)
Weekly Management Note Current Dose: 7.2  Gy  Projected Dose:  61 Gy   Narrative:  The patient presents for routine under treatment assessment.  CBCT/MVCT images/Port film x-rays were reviewed.  The chart was checked. Doing well. Left arm numb "but I sleep on it at night". Approved for disability due to COPD. RN education performed.   Physical Findings: Weight: 241 lb 11.2 oz (109.634 kg). Unchanged  Impression:  The patient is tolerating radiation.  Plan:  Continue treatment as planned. Encouraged her to ask PCP to sign handicap placard.

## 2015-04-09 NOTE — Progress Notes (Signed)
Weight and vitals stable. Denies pain. Concerned since starting radiation therapy she has noted her left arm is numb when she wakes up in the morning. She goes on to explain the numbness quickly resolved after she gets up and going. No edema of left arm noted. Patient confirms her rings fit comfortable on her left hand. Full ROM of left arm noted. No skin changes noted within treatment field. Oriented patient to staff and routine of the clinic. Educated patient reference potential side effects and management such as fatigue and skin changes. Provided patient with radiaplex and alra then, educated upon use. Provided patient with RADIATION THERAPY AND YOU handbook then, reviewed pertinent information. Patient viewed education video. Patient verbalized understanding of all reviewed.   BP 145/85 mmHg  Pulse 69  Resp 16  Wt 241 lb 11.2 oz (109.634 kg)  SpO2 100% Wt Readings from Last 3 Encounters:  04/09/15 241 lb 11.2 oz (109.634 kg)  02/13/15 234 lb 11.2 oz (106.459 kg)  01/15/15 231 lb 1.6 oz (104.826 kg)

## 2015-04-10 ENCOUNTER — Ambulatory Visit
Admission: RE | Admit: 2015-04-10 | Discharge: 2015-04-10 | Disposition: A | Discharge status: Home / Self Care | Payer: Medicaid Other | Source: Ambulatory Visit | Attending: Radiation Oncology | Admitting: Radiation Oncology

## 2015-04-10 DIAGNOSIS — C50412 Malignant neoplasm of upper-outer quadrant of left female breast: Secondary | ICD-10-CM | POA: Diagnosis not present

## 2015-04-13 ENCOUNTER — Ambulatory Visit
Admission: RE | Admit: 2015-04-13 | Discharge: 2015-04-13 | Disposition: A | Discharge status: Home / Self Care | Payer: Medicaid Other | Source: Ambulatory Visit | Attending: Radiation Oncology | Admitting: Radiation Oncology

## 2015-04-13 DIAGNOSIS — C50412 Malignant neoplasm of upper-outer quadrant of left female breast: Secondary | ICD-10-CM | POA: Diagnosis not present

## 2015-04-14 ENCOUNTER — Ambulatory Visit
Admission: RE | Admit: 2015-04-14 | Discharge: 2015-04-14 | Disposition: A | Discharge status: Home / Self Care | Payer: Medicaid Other | Source: Ambulatory Visit | Attending: Radiation Oncology | Admitting: Radiation Oncology

## 2015-04-14 VITALS — BP 135/74 | HR 78 | Temp 98.1°F | Wt 240.8 lb

## 2015-04-14 DIAGNOSIS — C50412 Malignant neoplasm of upper-outer quadrant of left female breast: Secondary | ICD-10-CM | POA: Diagnosis not present

## 2015-04-14 NOTE — Progress Notes (Signed)
Weekly Management Note Current Dose: 10.8  Gy  Projected Dose:  61 Gy   Narrative:  The patient presents for routine under treatment assessment.  CBCT/MVCT images/Port film x-rays were reviewed.  The chart was checked. Doing well. Some nipple pain.   Physical Findings: Weight: 240 lb 12.8 oz (109.226 kg). Unchanged  Impression:  The patient is tolerating radiation.  Plan:  Continue treatment as planned. Continue radiaplex.

## 2015-04-14 NOTE — Progress Notes (Signed)
Weekly assessment of radiation to left [employer] 7 of 33 treatments.Skin is pink.tenderness of the nipple and mild fatigue. BP 135/74 mmHg  Pulse 78  Temp(Src) 98.1 F (36.7 C)  Wt 240 lb 12.8 oz (109.226 kg)

## 2015-04-15 ENCOUNTER — Encounter (HOSPITAL_COMMUNITY): Payer: Self-pay | Admitting: Hematology & Oncology

## 2015-04-15 ENCOUNTER — Encounter (HOSPITAL_COMMUNITY): Payer: Medicaid Other | Attending: Oncology | Admitting: Hematology & Oncology

## 2015-04-15 ENCOUNTER — Ambulatory Visit
Admission: RE | Admit: 2015-04-15 | Discharge: 2015-04-15 | Disposition: A | Discharge status: Home / Self Care | Payer: Medicaid Other | Source: Ambulatory Visit | Attending: Radiation Oncology | Admitting: Radiation Oncology

## 2015-04-15 VITALS — BP 129/82 | HR 72 | Temp 98.3°F | Resp 18 | Wt 240.9 lb

## 2015-04-15 DIAGNOSIS — C50412 Malignant neoplasm of upper-outer quadrant of left female breast: Secondary | ICD-10-CM

## 2015-04-15 DIAGNOSIS — R319 Hematuria, unspecified: Secondary | ICD-10-CM | POA: Insufficient documentation

## 2015-04-15 DIAGNOSIS — E876 Hypokalemia: Secondary | ICD-10-CM | POA: Insufficient documentation

## 2015-04-15 NOTE — Progress Notes (Signed)
[facility] Progress Note  Patient Care Team: Sinda Du, MD as PCP - General (Internal Medicine)  CHIEF COMPLAINTS/PURPOSE OF CONSULTATION:  L breast cancer History of L breast core biopsy for microcalcifications in 2014, fibroadenoma. CMF cycle 1 on 10/02/2014 No matching staging information was found for the patient.  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 This has imaging features suspicious for malignancy.   07/01/2014 Initial Biopsy ER+ 100%, PR+ 91%, Ki-67 32% Invasive mammary carcinoma with lobular features   08/20/2014 Surgery Left breast seed localized lumpectomy and left axilla sentinel lymph node biopsy. Final Staging TNM: pT2, pN1a, pMX.   09/17/2014 Surgery port-a cath placement and re-excision   10/02/2014 -  Chemotherapy CMF.  patient refused Taxane-containing chemotherapy.    HISTORY OF PRESENTING ILLNESS:  Regina Mcmahon 64 y.o. 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 She has completed chemotherapy without any significant complication.  Ms. Donlon is here alone today. She says that she is doing great. She feels good; she says even her breathing is feeling a whole lot better.  With regards to her smoking, she says she's still smoking, but since going to radiation, she has cut back.  She says her family is doing well, and she's about to be a grandmother again; but she's very disappointed and a bit devastated by this news because she notes that her daughter is "not a good mother" and "grandma has had to do a lot of raising her children."   It seems like most her distress is caused by concerns about her daughter's well being.  She is eating well. No new pain.  MEDICAL HISTORY:  Past Medical History  Diagnosis Date  . Hypertension   . Panic attacks   . Chronic airway obstruction   . Pain     Hx.pain in joint involving pelvic region and thigh; pain in limb  . Sinusitis   . Asthma   . Palpitations   . Hemorrhage rectum     and anus.  . Symptomatic menopausal or female climacteric states   . Asymptomatic varicose veins   . Other symptoms involving digestive system(787.99)   . Depression   . Breast cancer 06/2014    left  . Insomnia     SURGICAL HISTORY: Past Surgical History  Procedure Laterality Date  . Cesarean section    . Cholecystectomy  1985  . Tubal ligation    . Colonoscopy    . Radioactive seed guided mastectomy with axillary sentinel lymph node biopsy Left 08/20/2014    Procedure: LEFT BREAST LUMPECTOMY WITH RADIOACTIVE SEED LOCALIZATION AND SENTINEL LYMPH NODE MAPPING;  Surgeon: Erroll Luna, MD;  Location: [facility];  Service: General;  Laterality: Left;  . Portacath placement Right 09/17/2014    Procedure: INSERTION PORT-A-CATH WITH ULTRASOUND;  Surgeon: Erroll Luna, MD;  Location: [facility];  Service: General;  Laterality: Right;  IJ  . Re-excision of breast lumpectomy Left 09/17/2014    Procedure: RE-EXCISION OF BREAST LUMPECTOMY;  Surgeon: Erroll Luna, MD;  Location: [facility];  Service: General;  Laterality: Left;  . Port-a-cath removal  02/2015    SOCIAL HISTORY: Social History   Social History  . Marital Status: Divorced    Spouse Name: N/A  . Number of Children: 2  . Years of Education: N/A   Occupational History  . Not on file.   Social History Main Topics  . Smoking status: Current Every Day Smoker -- 2.00 packs/day    Types: Cigarettes  . Smokeless tobacco: Never Used  . Alcohol Use: No  . Drug Use: No  . Sexual Activity:  No   Other Topics Concern  . Not on file   Social History Narrative  Smokes 2 PPD   FAMILY HISTORY: Family History  Problem Relation Age of Onset  . Diabetes Mother   . Ovarian cancer Mother 36  . Other Father     died in MVA at age 12  . Other Sister     mitral valve disorder  . Melanoma Sister 54    removed from back of leg  . Colon cancer Brother   . Cancer Paternal Aunt     leukemia, bone, or lung cancer  . Alzheimer's disease Maternal Grandmother   . Heart attack Maternal Grandfather   . Heart attack Paternal Grandmother   . COPD Paternal Grandfather   . Emphysema Paternal Grandfather   . Congestive Heart Failure Paternal Aunt   . Stomach cancer Paternal Uncle   . Kidney cancer Maternal Uncle   . Cancer Cousin     dx. teens/dx. 40s  . Cancer Cousin   . Colon cancer Cousin    indicated that her mother is deceased. She indicated that her father is deceased. She indicated that her sister is alive. She indicated that her brother is deceased. She indicated that her maternal grandmother is deceased. She indicated that her maternal grandfather is deceased. She indicated that her paternal grandmother is deceased. She indicated that her paternal grandfather is deceased. She indicated that her daughter is alive. She indicated that her son is alive. She indicated that only one of her two maternal aunts is alive. She indicated that only one of her three maternal uncles is alive. She indicated that only one of her three paternal aunts is alive. She indicated that both of her paternal uncles are deceased. She indicated that both of her cousins are deceased.   ALLERGIES:  has No Known Allergies.  MEDICATIONS:  Current Outpatient Prescriptions  Medication Sig Dispense Refill  . acetaminophen (TYLENOL) 325 MG tablet Take 650 mg by mouth every 6 (six) hours as needed.    Marland Kitchen albuterol (PROVENTIL HFA;VENTOLIN HFA) 108 (90 BASE) MCG/ACT inhaler Inhale 2 puffs into the lungs every 6 (six)  hours as needed for wheezing or shortness of breath.    . ALPRAZolam (XANAX) 0.5 MG tablet Take 0.5 mg by mouth 3 (three) times daily as needed for anxiety.    . calcium carbonate (TUMS - DOSED IN MG ELEMENTAL CALCIUM) 500 MG chewable tablet Chew 1 tablet by mouth 3 (three) times daily with meals.    . diphenhydramine-acetaminophen (TYLENOL PM) 25-500 MG TABS Take 2 tablets by mouth at bedtime.    . hydrochlorothiazide (MICROZIDE) 12.5 MG capsule Take 12.5 mg by mouth daily.    Marland Kitchen ibuprofen (ADVIL,MOTRIN) 200 MG tablet Take 200 mg by mouth every 6 (six) hours as needed.    . non-metallic deodorant Jethro Poling) MISC Apply 1 application topically daily as needed.    Marland Kitchen omeprazole (PRILOSEC) 40 MG capsule  Take 1 capsule (40 mg total) by mouth daily. 30 capsule 4  . Wound Dressings (RADIAGEL EX) Apply 170 g topically.    . Alum & Mag Hydroxide-Simeth (MAGIC MOUTHWASH) SOLN Take 5 mLs by mouth 4 (four) times daily as needed for mouth pain. (Patient not taking: Reported on 02/13/2015) 240 mL 1  . docusate sodium (COLACE) 100 MG capsule Take 100 mg by mouth 3 (three) times daily as needed for mild constipation.    . ondansetron (ZOFRAN) 8 MG tablet Take 1 tablet every 8 hours as needed for nausea/vomiting. (Patient not taking: Reported on 02/13/2015) 30 tablet 2  . potassium chloride SA (K-DUR,KLOR-CON) 20 MEQ tablet Take 1 tablet (20 mEq total) by mouth 2 (two) times daily. (Patient not taking: Reported on 04/15/2015) 60 tablet 1  . prochlorperazine (COMPAZINE) 10 MG tablet Take 1 tablet (10 mg total) by mouth every 6 (six) hours as needed (Nausea or vomiting). (Patient not taking: Reported on 04/15/2015) 30 tablet 1  . temazepam (RESTORIL) 15 MG capsule Take 1-2 capsules (15-30 mg total) by mouth at bedtime as needed for sleep. (Patient not taking: Reported on 04/15/2015) 60 capsule 0  . tiZANidine (ZANAFLEX) 4 MG tablet 4 mg as needed.      No current facility-administered medications for this visit.    Review of  Systems  Constitutional: Negative for fever, chills, weight loss and malaise/fatigue.  HENT: Negative for congestion, hearing loss, nosebleeds, sore throat and tinnitus.   Eyes: Negative for blurred vision, double vision, pain and discharge.  Respiratory: Negative for cough, hemoptysis, sputum production, shortness of breath and wheezing.  Cardiovascular: Negative for chest pain, palpitations, claudication, leg swelling and PND.  Gastrointestinal: Negative for heartburn, nausea, vomiting, abdominal pain, diarrhea, constipation, blood in stool and melena.  Genitourinary: Negative for dysuria, urgency, frequency and hematuria.  Musculoskeletal: Negative for myalgias, joint pain and falls.  Skin: Negative for itching and rash.  Neurological: Negative for dizziness, tingling, tremors, sensory change, speech change, focal weakness, seizures, loss of consciousness, weakness and headaches.  Endo/Heme/Allergies: Does not bruise/bleed easily.  Psychiatric/Behavioral: Negative for depression, suicidal ideas, memory loss and substance abuse. The patient is not nervous/anxious and does not have insomnia.   14 point review of systems was performed and is negative except as detailed under history of present illness and above   PHYSICAL EXAMINATION: ECOG PERFORMANCE STATUS: 0 - Asymptomatic  Filed Vitals:   04/15/15 0916  BP: 129/82  Pulse: 72  Temp: 98.3 F (36.8 C)  Resp: 18   Filed Weights   04/15/15 0916  Weight: 240 lb 14.4 oz (109.272 kg)   Physical Exam  Constitutional: She is oriented to person, place, and time and well-developed, well-nourished, and in no distress.   HENT:  Head: Normocephalic and atraumatic.  Nose: Nose normal.  Mouth/Throat: Oropharynx is clear and moist. No oropharyngeal exudate.  Eyes: Conjunctivae and EOM are normal. Pupils are equal, round, and reactive to light. Right eye exhibits no discharge. Left eye exhibits no discharge. No scleral icterus.  Neck: Normal  range of motion. Neck supple. No tracheal deviation present. No thyromegaly present.  Cardiovascular: Normal rate, regular rhythm and normal heart sounds.  Exam reveals no gallop and no friction rub.   No murmur heard. Pulmonary/Chest: Effort normal and breath sounds normal. She has no wheezes. She has no rales.  Abdominal: Soft. Bowel sounds are normal. She exhibits no distension and no mass. There is no tenderness. There is no rebound and no guarding.  Musculoskeletal:  Normal range of motion. She exhibits no edema.  Breasts: L breast well healed surgical incision site, minimal scar tissue   Minor redness and irritation Lymphadenopathy:    She has no cervical adenopathy.  Neurological: She is alert and oriented to person, place, and time. She has normal reflexes. No cranial nerve deficit. Gait normal. Coordination normal.  Skin: Skin is warm and dry. No rash noted.  Psychiatric: Mood, memory, affect and judgment normal.  Nursing note and vitals reviewed.   LABORATORY DATA:  I have reviewed the data as listed Lab Results  Component Value Date   WBC 7.1 01/15/2015   HGB 15.4* 01/15/2015   HCT 45.7 01/15/2015   MCV 94.0 01/15/2015   PLT 184 01/15/2015     Chemistry      Component Value Date/Time   NA 140 01/15/2015 0911   K 4.5 01/15/2015 0911   CL 101 01/15/2015 0911   CO2 32 01/15/2015 0911   BUN 13 01/15/2015 0911   CREATININE 0.84 01/15/2015 0911      Component Value Date/Time   CALCIUM 9.2 01/15/2015 0911   ALKPHOS 75 01/15/2015 0911   AST 16 01/15/2015 0911   ALT 16 01/15/2015 0911   BILITOT 1.0 01/15/2015 0911      ASSESSMENT & PLAN:  Stage II, infiltrating ductal carcinoma of the left breast, ER positive, PR positive, HER-2 negative Anxiety Tobacco abuse Insomnia, difficulty falling asleep Constipation  She has completed chemotherapy and is currently starting radiation. She has completed 7 radiation treatments.   We again addressed the importance of smoking  cessation. I gave her several options to help her discontinue smoking. She would like to work on this by herself first. We will address this again at follow-up  She has never had a screening colonoscopy and I advised her that we will refer her in the near future once XRT is complete and she has recovered.  We briefly addressed endocrine therapy today. Her genetic testing is completed and negative for any significant mutations.  I will follow up with Ms. Banton in about 7 weeks or so, at which time we will start endocrine therapy and review her bone density testing.  Ms. Hinesley expressed wishes to have a hysterectomy. We will discuss this at follow-up in November as well.  Orders Placed This Encounter  Procedures  . DG Bone Density    Standing Status: Future     Number of Occurrences:      Standing Expiration Date: 04/14/2016    Order Specific Question:  Reason for Exam (SYMPTOM  OR DIAGNOSIS REQUIRED)    Answer:  high risk medication, post menopausal    Order Specific Question:  Preferred imaging location?    Answer:  [facility]  . Vitamin D 25 hydroxy    Standing Status: Future     Number of Occurrences:      Standing Expiration Date: 04/14/2016  . CBC with Differential    Standing Status: Future     Number of Occurrences:      Standing Expiration Date: 04/14/2016  . Comprehensive metabolic panel    Standing Status: Future     Number of Occurrences:      Standing Expiration Date: 04/14/2016    This note was signed electronically.    This document serves as a record of services personally performed by Ancil Linsey, MD. It was created on her behalf by Toni Amend, a trained medical scribe. The creation of this record is based on the scribe's  personal observations and the provider's statements to them. This document has been checked and approved by the attending provider.  I have reviewed the above documentation for accuracy and completeness, and I agree with the  above.  Kelby Fam. Penland MD

## 2015-04-15 NOTE — Patient Instructions (Addendum)
[facility] at [facility] Discharge Instructions  RECOMMENDATIONS MADE BY THE CONSULTANT AND ANY TEST RESULTS WILL BE SENT TO YOUR REFERRING PHYSICIAN.  Exam and discussion by Dr. Whitney Muse. You are doing well.  Continue with radiation as scheduled. Bone Density to be done at [facility] Report any new lumps, bone pain, shortness of breath or other symptoms.  Follow-up in 7 weeks with labs and office visit.  Thank you for choosing [facility] at [facility] to provide your oncology and hematology care.  To afford each patient quality time with our provider, please arrive at least 15 minutes before your scheduled appointment time.    You need to re-schedule your appointment should you arrive 10 or more minutes late.  We strive to give you quality time with our providers, and arriving late affects you and other patients whose appointments are after yours.  Also, if you no show three or more times for appointments you may be dismissed from the clinic at the providers discretion.     Again, thank you for choosing [facility].  Our hope is that these requests will decrease the amount of time that you wait before being seen by our physicians.       _____________________________________________________________  Should you have questions after your visit to [facility], please contact our office at [phone] between the hours of 8:30 a.m. and 4:30 p.m.  Voicemails left after 4:30 p.m. will not be returned until the following business day.  For prescription refill requests, have your pharmacy contact our office.

## 2015-04-16 ENCOUNTER — Ambulatory Visit
Admission: RE | Admit: 2015-04-16 | Discharge: 2015-04-16 | Disposition: A | Discharge status: Home / Self Care | Payer: Medicaid Other | Source: Ambulatory Visit | Attending: Radiation Oncology | Admitting: Radiation Oncology

## 2015-04-16 DIAGNOSIS — C50412 Malignant neoplasm of upper-outer quadrant of left female breast: Secondary | ICD-10-CM | POA: Diagnosis not present

## 2015-04-17 ENCOUNTER — Ambulatory Visit
Admission: RE | Admit: 2015-04-17 | Discharge: 2015-04-17 | Disposition: A | Discharge status: Home / Self Care | Payer: Medicaid Other | Source: Ambulatory Visit | Attending: Radiation Oncology | Admitting: Radiation Oncology

## 2015-04-17 DIAGNOSIS — C50412 Malignant neoplasm of upper-outer quadrant of left female breast: Secondary | ICD-10-CM | POA: Diagnosis not present

## 2015-04-20 ENCOUNTER — Ambulatory Visit
Admission: RE | Admit: 2015-04-20 | Discharge: 2015-04-20 | Disposition: A | Discharge status: Home / Self Care | Payer: Medicaid Other | Source: Ambulatory Visit | Attending: Radiation Oncology | Admitting: Radiation Oncology

## 2015-04-20 DIAGNOSIS — C50412 Malignant neoplasm of upper-outer quadrant of left female breast: Secondary | ICD-10-CM | POA: Diagnosis not present

## 2015-04-21 ENCOUNTER — Ambulatory Visit
Admission: RE | Admit: 2015-04-21 | Discharge: 2015-04-21 | Disposition: A | Discharge status: Home / Self Care | Payer: Medicaid Other | Source: Ambulatory Visit | Attending: Radiation Oncology | Admitting: Radiation Oncology

## 2015-04-21 ENCOUNTER — Encounter: Payer: Self-pay | Admitting: Radiation Oncology

## 2015-04-21 VITALS — BP 136/70 | HR 69 | Temp 98.0°F | Resp 20 | Wt 244.8 lb

## 2015-04-21 DIAGNOSIS — C50412 Malignant neoplasm of upper-outer quadrant of left female breast: Secondary | ICD-10-CM | POA: Diagnosis not present

## 2015-04-21 NOTE — Progress Notes (Signed)
Weekly rad txs  12/33 completed, mild erythema,skin intact, radiaplex bid,  Mild fatigue, appetite good, still smoking 1.5ppd,  1:42 PM Wt Readings from Last 3 Encounters:  04/21/15 244 lb 12.8 oz (111.041 kg)  04/15/15 240 lb 14.4 oz (109.272 kg)  04/14/15 240 lb 12.8 oz (109.226 kg)   BP 136/70 mmHg  Pulse 69  Temp(Src) 98 F (36.7 C) (Oral)  Resp 20  Wt 244 lb 12.8 oz (111.041 kg)  1:42 PM

## 2015-04-21 NOTE — Progress Notes (Signed)
Weekly Management Note Current Dose: 21.6 Gy  Projected Dose:  61 Gy   Narrative:  The patient presents for routine under treatment assessment.  CBCT/MVCT images/Port film x-rays were reviewed.  The chart was checked. Doing well. Some nipple pain. Still smoking.   Physical Findings: Weight: 244 lb 12.8 oz (111.041 kg). Unchanged  Impression:  The patient is tolerating radiation.  Plan:  Continue treatment as planned. Continue radiaplex.

## 2015-04-22 ENCOUNTER — Ambulatory Visit
Admission: RE | Admit: 2015-04-22 | Discharge: 2015-04-22 | Disposition: A | Discharge status: Home / Self Care | Payer: Medicaid Other | Source: Ambulatory Visit | Attending: Radiation Oncology | Admitting: Radiation Oncology

## 2015-04-22 DIAGNOSIS — C50412 Malignant neoplasm of upper-outer quadrant of left female breast: Secondary | ICD-10-CM | POA: Diagnosis not present

## 2015-04-23 ENCOUNTER — Ambulatory Visit
Admission: RE | Admit: 2015-04-23 | Discharge: 2015-04-23 | Disposition: A | Discharge status: Home / Self Care | Payer: Medicaid Other | Source: Ambulatory Visit | Attending: Radiation Oncology | Admitting: Radiation Oncology

## 2015-04-23 DIAGNOSIS — C50412 Malignant neoplasm of upper-outer quadrant of left female breast: Secondary | ICD-10-CM | POA: Diagnosis not present

## 2015-04-24 ENCOUNTER — Ambulatory Visit
Admission: RE | Admit: 2015-04-24 | Discharge: 2015-04-24 | Disposition: A | Discharge status: Home / Self Care | Payer: Medicaid Other | Source: Ambulatory Visit | Attending: Radiation Oncology | Admitting: Radiation Oncology

## 2015-04-24 DIAGNOSIS — C50412 Malignant neoplasm of upper-outer quadrant of left female breast: Secondary | ICD-10-CM | POA: Diagnosis not present

## 2015-04-27 ENCOUNTER — Other Ambulatory Visit (HOSPITAL_COMMUNITY): Payer: Medicaid Other

## 2015-04-27 ENCOUNTER — Ambulatory Visit
Admission: RE | Admit: 2015-04-27 | Discharge: 2015-04-27 | Disposition: A | Discharge status: Home / Self Care | Payer: Medicaid Other | Source: Ambulatory Visit | Attending: Radiation Oncology | Admitting: Radiation Oncology

## 2015-04-28 ENCOUNTER — Ambulatory Visit
Admission: RE | Admit: 2015-04-28 | Discharge: 2015-04-28 | Disposition: A | Discharge status: Home / Self Care | Payer: Medicaid Other | Source: Ambulatory Visit | Attending: Radiation Oncology | Admitting: Radiation Oncology

## 2015-04-28 ENCOUNTER — Encounter: Payer: Self-pay | Admitting: Radiation Oncology

## 2015-04-28 VITALS — BP 137/80 | HR 95 | Temp 98.0°F | Ht 61.0 in | Wt 242.4 lb

## 2015-04-28 DIAGNOSIS — F17200 Nicotine dependence, unspecified, uncomplicated: Secondary | ICD-10-CM | POA: Diagnosis not present

## 2015-04-28 DIAGNOSIS — Z510 Encounter for antineoplastic radiation therapy: Secondary | ICD-10-CM | POA: Insufficient documentation

## 2015-04-28 DIAGNOSIS — C50412 Malignant neoplasm of upper-outer quadrant of left female breast: Secondary | ICD-10-CM

## 2015-04-28 NOTE — Progress Notes (Signed)
Weekly Management Note Current Dose: 30.6 Gy  Projected Dose:  61 Gy   Narrative:  The patient presents for routine under treatment assessment.  CBCT/MVCT images/Port film x-rays were reviewed.  The chart was checked. Regina Mcmahon is here for her 17th treatment of radiation to her left breast. She reports mild fatigue, but is still able to complete her normal activities. She denies any pain at this time. Her left breast has mild redness, and she reports her nipple area is tender to palpation. She denies pruritus of the left breast. She does report that she has noted several moles appearing on her left breast and is concerned about this.   Physical Findings: Weight: 242 lb 6.4 oz (109.952 kg). Skin redness in the txt area. Moles that are darker on the txted breast.  Impression:  The patient is tolerating radiation.  Plan:  Continue treatment as planned. Continue radiaplex. Benign moles, darker on treated breast.   This document serves as a record of services personally performed by Thea Silversmith, MD. It was created on her behalf by Darcus Austin, a trained medical scribe. The creation of this record is based on the scribe's personal observations and the provider's statements to them. This document has been checked and approved by the attending provider.

## 2015-04-28 NOTE — Progress Notes (Signed)
Regina Mcmahon is here for her 17th treatment of radiation to her left breast. She reports mild fatigue, but is still able to complete her normal activities. She denies any pain at this time.  Her left breast has mild redness, and she reports her nipple area is tender to touch. She denies any itching at the left breast. She does report that she has noted several moles appearing on her right breast and is concerned about this.  BP 137/80 mmHg  Pulse 95  Temp(Src) 98 F (36.7 C)  Ht '5\' 1"'$  (1.549 m)  Wt 242 lb 6.4 oz (109.952 kg)  BMI 45.82 kg/m2  Wt Readings from Last 3 Encounters:  04/28/15 242 lb 6.4 oz (109.952 kg)  04/21/15 244 lb 12.8 oz (111.041 kg)  04/15/15 240 lb 14.4 oz (109.272 kg)

## 2015-04-29 ENCOUNTER — Ambulatory Visit
Admission: RE | Admit: 2015-04-29 | Discharge: 2015-04-29 | Disposition: A | Discharge status: Home / Self Care | Payer: Medicaid Other | Source: Ambulatory Visit | Attending: Radiation Oncology | Admitting: Radiation Oncology

## 2015-04-29 ENCOUNTER — Encounter: Payer: Self-pay | Admitting: Radiation Oncology

## 2015-04-29 DIAGNOSIS — Z510 Encounter for antineoplastic radiation therapy: Secondary | ICD-10-CM | POA: Diagnosis not present

## 2015-04-30 ENCOUNTER — Ambulatory Visit
Admission: RE | Admit: 2015-04-30 | Discharge: 2015-04-30 | Disposition: A | Discharge status: Home / Self Care | Payer: Medicaid Other | Source: Ambulatory Visit | Attending: Radiation Oncology | Admitting: Radiation Oncology

## 2015-04-30 DIAGNOSIS — C50412 Malignant neoplasm of upper-outer quadrant of left female breast: Secondary | ICD-10-CM | POA: Insufficient documentation

## 2015-04-30 DIAGNOSIS — Z510 Encounter for antineoplastic radiation therapy: Secondary | ICD-10-CM | POA: Insufficient documentation

## 2015-05-01 ENCOUNTER — Ambulatory Visit
Admission: RE | Admit: 2015-05-01 | Discharge: 2015-05-01 | Disposition: A | Discharge status: Home / Self Care | Payer: Medicaid Other | Source: Ambulatory Visit | Attending: Radiation Oncology | Admitting: Radiation Oncology

## 2015-05-01 DIAGNOSIS — Z510 Encounter for antineoplastic radiation therapy: Secondary | ICD-10-CM | POA: Diagnosis not present

## 2015-05-04 ENCOUNTER — Ambulatory Visit
Admission: RE | Admit: 2015-05-04 | Discharge: 2015-05-04 | Disposition: A | Discharge status: Home / Self Care | Payer: Medicaid Other | Source: Ambulatory Visit | Attending: Radiation Oncology | Admitting: Radiation Oncology

## 2015-05-04 DIAGNOSIS — Z510 Encounter for antineoplastic radiation therapy: Secondary | ICD-10-CM | POA: Diagnosis not present

## 2015-05-05 ENCOUNTER — Ambulatory Visit
Admission: RE | Admit: 2015-05-05 | Discharge: 2015-05-05 | Disposition: A | Discharge status: Home / Self Care | Payer: Medicaid Other | Source: Ambulatory Visit | Attending: Radiation Oncology | Admitting: Radiation Oncology

## 2015-05-05 DIAGNOSIS — C50412 Malignant neoplasm of upper-outer quadrant of left female breast: Secondary | ICD-10-CM

## 2015-05-05 DIAGNOSIS — Z510 Encounter for antineoplastic radiation therapy: Secondary | ICD-10-CM | POA: Diagnosis not present

## 2015-05-05 NOTE — Progress Notes (Signed)
Weekly Management Note Current Dose: 39.6 Gy  Projected Dose:  61 Gy   Narrative:  The patient presents for routine under treatment assessment.  CBCT/MVCT images/Port film x-rays were reviewed.  The chart was checked. Saw on tx machine. Breast sore.   Physical Findings: Weight:  . Skin redness in the txt area. No moist desquamation.   Impression:  The patient is tolerating radiation.  Plan: Continue RT. Continue radiaplex. Ok to proceed with boost.

## 2015-05-06 ENCOUNTER — Ambulatory Visit
Admission: RE | Admit: 2015-05-06 | Discharge: 2015-05-06 | Disposition: A | Discharge status: Home / Self Care | Payer: Medicaid Other | Source: Ambulatory Visit | Attending: Radiation Oncology | Admitting: Radiation Oncology

## 2015-05-06 DIAGNOSIS — Z510 Encounter for antineoplastic radiation therapy: Secondary | ICD-10-CM | POA: Diagnosis not present

## 2015-05-07 ENCOUNTER — Encounter (HOSPITAL_COMMUNITY): Payer: Medicaid Other

## 2015-05-07 ENCOUNTER — Ambulatory Visit
Admission: RE | Admit: 2015-05-07 | Discharge: 2015-05-07 | Disposition: A | Discharge status: Home / Self Care | Payer: Medicaid Other | Source: Ambulatory Visit | Attending: Radiation Oncology | Admitting: Radiation Oncology

## 2015-05-07 DIAGNOSIS — Z510 Encounter for antineoplastic radiation therapy: Secondary | ICD-10-CM | POA: Diagnosis not present

## 2015-05-08 ENCOUNTER — Ambulatory Visit
Admission: RE | Admit: 2015-05-08 | Discharge: 2015-05-08 | Disposition: A | Discharge status: Home / Self Care | Payer: Medicaid Other | Source: Ambulatory Visit | Attending: Radiation Oncology | Admitting: Radiation Oncology

## 2015-05-08 ENCOUNTER — Telehealth: Payer: Self-pay | Admitting: *Deleted

## 2015-05-08 ENCOUNTER — Ambulatory Visit: Payer: Medicaid Other

## 2015-05-08 NOTE — Telephone Encounter (Signed)
"  I asked for radiation therapy.  I'm not feeling good and need to cancel today's appointment."  To be treated on linac 2 and call tansfered to therapist ext. [phone].

## 2015-05-11 ENCOUNTER — Ambulatory Visit
Admission: RE | Admit: 2015-05-11 | Discharge: 2015-05-11 | Disposition: A | Discharge status: Home / Self Care | Payer: Medicaid Other | Source: Ambulatory Visit | Attending: Radiation Oncology | Admitting: Radiation Oncology

## 2015-05-11 ENCOUNTER — Ambulatory Visit: Payer: Medicaid Other

## 2015-05-11 DIAGNOSIS — Z510 Encounter for antineoplastic radiation therapy: Secondary | ICD-10-CM | POA: Diagnosis not present

## 2015-05-12 ENCOUNTER — Encounter: Payer: Self-pay | Admitting: Radiation Oncology

## 2015-05-12 ENCOUNTER — Ambulatory Visit: Payer: 59

## 2015-05-12 ENCOUNTER — Ambulatory Visit
Admission: RE | Admit: 2015-05-12 | Discharge: 2015-05-12 | Disposition: A | Discharge status: Home / Self Care | Payer: Medicaid Other | Source: Ambulatory Visit | Attending: Radiation Oncology | Admitting: Radiation Oncology

## 2015-05-12 VITALS — BP 129/88 | HR 77 | Resp 16 | Wt 243.5 lb

## 2015-05-12 DIAGNOSIS — Z510 Encounter for antineoplastic radiation therapy: Secondary | ICD-10-CM | POA: Diagnosis not present

## 2015-05-12 DIAGNOSIS — C50412 Malignant neoplasm of upper-outer quadrant of left female breast: Secondary | ICD-10-CM

## 2015-05-12 NOTE — Progress Notes (Signed)
Weight and vitals stable. Denies pain. Reports fatigue. Reports one day last week she was very emotional and cried most of the day. Discussed this further with patient and she will inform RN or Dr. Pablo Ledger if these episodes become more frequent. Hyperpigmentation without desquamation of left/treated breast. Reports using radiaplex bid as directed.   BP 129/88 mmHg  Pulse 77  Resp 16  Wt 243 lb 8 oz (110.451 kg)  SpO2 100% Wt Readings from Last 3 Encounters:  05/12/15 243 lb 8 oz (110.451 kg)  04/28/15 242 lb 6.4 oz (109.952 kg)  04/21/15 244 lb 12.8 oz (111.041 kg)

## 2015-05-12 NOTE — Progress Notes (Signed)
Weekly Management Note Current Dose:  45 Gy  Projected Dose: 61 Gy   Narrative:  The patient presents for routine under treatment assessment.  CBCT/MVCT images/Port film x-rays were reviewed.  The chart was checked. Tearful. Fear of recurrence. Financial concerns.   Physical Findings: Weight: 243 lb 8 oz (110.451 kg). Unchanged. Pink breast. No moist desquamation.   Impression:  The patient is tolerating radiation.  Plan:  Continue treatment as planned. Discussed survivorship and FYNN. Refer to SW.

## 2015-05-13 ENCOUNTER — Ambulatory Visit
Admission: RE | Admit: 2015-05-13 | Discharge: 2015-05-13 | Disposition: A | Discharge status: Home / Self Care | Payer: Medicaid Other | Source: Ambulatory Visit | Attending: Radiation Oncology | Admitting: Radiation Oncology

## 2015-05-13 DIAGNOSIS — Z510 Encounter for antineoplastic radiation therapy: Secondary | ICD-10-CM | POA: Diagnosis not present

## 2015-05-14 ENCOUNTER — Ambulatory Visit
Admission: RE | Admit: 2015-05-14 | Discharge: 2015-05-14 | Disposition: A | Discharge status: Home / Self Care | Payer: Medicaid Other | Source: Ambulatory Visit | Attending: Radiation Oncology | Admitting: Radiation Oncology

## 2015-05-14 DIAGNOSIS — Z510 Encounter for antineoplastic radiation therapy: Secondary | ICD-10-CM | POA: Diagnosis not present

## 2015-05-15 ENCOUNTER — Ambulatory Visit
Admission: RE | Admit: 2015-05-15 | Discharge: 2015-05-15 | Disposition: A | Discharge status: Home / Self Care | Payer: Medicaid Other | Source: Ambulatory Visit | Attending: Radiation Oncology | Admitting: Radiation Oncology

## 2015-05-15 DIAGNOSIS — Z510 Encounter for antineoplastic radiation therapy: Secondary | ICD-10-CM | POA: Diagnosis not present

## 2015-05-18 ENCOUNTER — Ambulatory Visit
Admission: RE | Admit: 2015-05-18 | Discharge: 2015-05-18 | Disposition: A | Discharge status: Home / Self Care | Payer: Medicaid Other | Source: Ambulatory Visit | Attending: Radiation Oncology | Admitting: Radiation Oncology

## 2015-05-18 DIAGNOSIS — Z510 Encounter for antineoplastic radiation therapy: Secondary | ICD-10-CM | POA: Diagnosis not present

## 2015-05-19 ENCOUNTER — Ambulatory Visit
Admission: RE | Admit: 2015-05-19 | Discharge: 2015-05-19 | Disposition: A | Discharge status: Home / Self Care | Payer: Medicaid Other | Source: Ambulatory Visit | Attending: Radiation Oncology | Admitting: Radiation Oncology

## 2015-05-19 VITALS — BP 141/72 | HR 71 | Resp 16 | Wt 243.4 lb

## 2015-05-19 DIAGNOSIS — C50412 Malignant neoplasm of upper-outer quadrant of left female breast: Secondary | ICD-10-CM

## 2015-05-19 DIAGNOSIS — Z510 Encounter for antineoplastic radiation therapy: Secondary | ICD-10-CM | POA: Diagnosis not present

## 2015-05-19 MED ORDER — SONAFINE EX EMUL
1.0000 "application " | Freq: Once | CUTANEOUS | Status: AC
  Administered 2015-05-19: 1 via TOPICAL
  Filled 2015-05-19: qty 45

## 2015-05-19 NOTE — Progress Notes (Signed)
Weekly Management Note Current Dose: 57 Gy  Projected Dose: 61 Gy   Narrative:  The patient presents for routine under treatment assessment.  CBCT/MVCT images/Port film x-rays were reviewed.  The chart was checked.  Doing well. Grateful for care. appt with Penland in next 2 weeks.   Physical Findings: Weight: 243 lb 6.4 oz (110.406 kg). Unchanged. Pink breast. No moist desquamation.   Impression:  The patient is tolerating radiation.  Plan:  Continue treatment as planned. Grateful for care. Discussed post RT skin care.

## 2015-05-19 NOTE — Progress Notes (Signed)
Weight and vitals stable. Denies pain. Hyperpigmentation noted in left clavicular region without desquamation. Reports left nipple is dry and tender. Reports left breast is dry and itchy. Reports using radiaplex bid as directed. Reports mild fatigue.   BP 141/72 mmHg  Pulse 71  Resp 16  Wt 243 lb 6.4 oz (110.406 kg)  SpO2 100% Wt Readings from Last 3 Encounters:  05/19/15 243 lb 6.4 oz (110.406 kg)  05/12/15 243 lb 8 oz (110.451 kg)  04/28/15 242 lb 6.4 oz (109.952 kg)

## 2015-05-19 NOTE — Addendum Note (Signed)
Encounter addended by: Heywood Footman, RN on: 05/19/2015  4:38 PM<BR>     Documentation filed: Orders, [facility]x [facility], [facility]patient [facility]

## 2015-05-19 NOTE — Addendum Note (Signed)
Encounter addended by: Neysa Hotter, [facility] on: 05/19/2015  4:47 PM<BR>     Documentation filed: Rx Order Verification

## 2015-05-20 ENCOUNTER — Ambulatory Visit: Payer: Medicaid Other

## 2015-05-20 ENCOUNTER — Ambulatory Visit
Admission: RE | Admit: 2015-05-20 | Discharge: 2015-05-20 | Disposition: A | Discharge status: Home / Self Care | Payer: Medicaid Other | Source: Ambulatory Visit | Attending: Radiation Oncology | Admitting: Radiation Oncology

## 2015-05-20 DIAGNOSIS — Z510 Encounter for antineoplastic radiation therapy: Secondary | ICD-10-CM | POA: Diagnosis not present

## 2015-05-21 ENCOUNTER — Encounter: Payer: Self-pay | Admitting: Radiation Oncology

## 2015-05-21 ENCOUNTER — Ambulatory Visit
Admission: RE | Admit: 2015-05-21 | Discharge: 2015-05-21 | Disposition: A | Discharge status: Home / Self Care | Payer: Medicaid Other | Source: Ambulatory Visit | Attending: Radiation Oncology | Admitting: Radiation Oncology

## 2015-05-21 DIAGNOSIS — Z510 Encounter for antineoplastic radiation therapy: Secondary | ICD-10-CM | POA: Diagnosis not present

## 2015-05-22 ENCOUNTER — Other Ambulatory Visit: Payer: Self-pay | Admitting: Adult Health

## 2015-05-22 DIAGNOSIS — C50412 Malignant neoplasm of upper-outer quadrant of left female breast: Secondary | ICD-10-CM

## 2015-05-28 NOTE — Progress Notes (Signed)
  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  Left supraclavicular fossa and axilla/ 45 Gy at 1.8 Gy per fraction x 25 fractions Left breast boost/ 16 Gy at 2 Gy per fraction x 8 fractions  Beams/energy:  Opposed tangents with reduced fields / 6 and 10 MV photons RAO/LPO with 10 and 15  MV photons Enface electrons / 18 MeV   Narrative: The patient tolerated radiation treatment relatively well.   She had the expected dermatitis and dry desquamation.  She had some fatigue.   Plan: The patient has completed radiation treatment. The patient will return to radiation oncology clinic for routine followup in one month. I advised them to call or return sooner if they have any questions or concerns related to their recovery or treatment.  ------------------------------------------------  Thea Silversmith, MD

## 2015-05-28 NOTE — Progress Notes (Signed)
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   NARRATIVE: Bronson Ing underwent complex simulation and treatment planning for her boost treatment today.  Her tumor volume was outlined on the planning CT scan. The depth of her cavity was felt to be appropriate for treatment with electrons    18  MeV electrons will be prescribed to the 100%  isodose line.   I personally oversaw and approved the construction of a unique block which will be used for beam modification purposes.  An isodose plan is requested.

## 2015-06-03 NOTE — Progress Notes (Signed)
[place] Progress Note  Patient Care Team: Sinda Du, MD as PCP - General (Internal Medicine)  CHIEF COMPLAINTS/PURPOSE OF CONSULTATION:  L breast cancer History of L breast core biopsy for microcalcifications in 2014, fibroadenoma. CMF cycle 1 on 10/02/2014 No matching staging information was found for the patient.  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 This has imaging features suspicious for malignancy.   07/01/2014 Initial Biopsy ER+ 100%, PR+ 91%, Ki-67 32% Invasive mammary carcinoma with lobular features   08/20/2014 Surgery Left breast seed localized lumpectomy and left axilla sentinel lymph node biopsy. Final Staging TNM: pT2, pN1a, pMX.   09/17/2014 Surgery port-a cath placement and re-excision   10/02/2014 -  Chemotherapy CMF.  patient refused Taxane-containing chemotherapy.    - 05/21/2015 Radiation Therapy completed whole breast radiation    HISTORY OF PRESENTING ILLNESS:  Regina Mcmahon 64 y.o. 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 She has completed chemotherapy without any significant complication.  She finished XRT two weeks ago and notes that is "fried my tail!".  She complains of draining from the breast. No fever or chills. Occasional shooting pain from the breast.  With regards to her smoking, she says she's still smoking, but since going to radiation, she has cut back.   MEDICAL HISTORY:  Past Medical History  Diagnosis Date  . Hypertension   . Panic attacks   . Chronic airway obstruction ([place])   . Pain    Hx.pain in joint involving pelvic region and thigh; pain in limb  . Sinusitis   . Asthma   . Palpitations   . Hemorrhage rectum     and anus.  . Symptomatic menopausal or female climacteric states   . Asymptomatic varicose veins   . Other symptoms involving digestive system(787.99)   . Depression   . Breast cancer ([place]) 06/2014    left  . Insomnia     SURGICAL HISTORY: Past Surgical History  Procedure Laterality Date  . Cesarean section    . Cholecystectomy  1985  . Tubal ligation    . Colonoscopy    . Radioactive seed guided mastectomy with axillary sentinel lymph node biopsy Left 08/20/2014    Procedure: LEFT BREAST LUMPECTOMY WITH RADIOACTIVE SEED LOCALIZATION AND SENTINEL LYMPH NODE MAPPING;  Surgeon: Erroll Luna, MD;  Location: [place];  Service: General;  Laterality: Left;  . Portacath placement Right 09/17/2014    Procedure: INSERTION PORT-A-CATH WITH ULTRASOUND;  Surgeon: Erroll Luna, MD;  Location: [place];  Service: General;  Laterality: Right;  IJ  . Re-excision of breast lumpectomy Left 09/17/2014    Procedure: RE-EXCISION OF BREAST LUMPECTOMY;  Surgeon: Erroll Luna, MD;  Location: [place];  Service: General;  Laterality: Left;  . Port-a-cath removal  02/2015    SOCIAL HISTORY: Social History   Social History  . Marital Status: Divorced    Spouse Name: N/A  . Number of Children: 2  . Years of Education: N/A   Occupational History  . Not on file.  Social History Main Topics  . Smoking status: Current Every Day Smoker -- 2.00 packs/day    Types: Cigarettes  . Smokeless tobacco: Never Used  . Alcohol Use: No  . Drug Use: No  . Sexual Activity: No   Other Topics Concern  . Not on file   Social History Narrative  Smokes 2 PPD   FAMILY HISTORY: Family History  Problem Relation Age of Onset  . Diabetes Mother   . Ovarian cancer Mother 63  . Other Father     died in MVA at age 52  . Other  Sister     mitral valve disorder  . Melanoma Sister 54    removed from back of leg  . Colon cancer Brother   . Cancer Paternal Aunt     leukemia, bone, or lung cancer  . Alzheimer's disease Maternal Grandmother   . Heart attack Maternal Grandfather   . Heart attack Paternal Grandmother   . COPD Paternal Grandfather   . Emphysema Paternal Grandfather   . Congestive Heart Failure Paternal Aunt   . Stomach cancer Paternal Uncle   . Kidney cancer Maternal Uncle   . Cancer Cousin     dx. teens/dx. 40s  . Cancer Cousin   . Colon cancer Cousin    indicated that her mother is deceased. She indicated that her father is deceased. She indicated that her sister is alive. She indicated that her brother is deceased. She indicated that her maternal grandmother is deceased. She indicated that her maternal grandfather is deceased. She indicated that her paternal grandmother is deceased. She indicated that her paternal grandfather is deceased. She indicated that her daughter is alive. She indicated that her son is alive. She indicated that only one of her two maternal aunts is alive. She indicated that only one of her three maternal uncles is alive. She indicated that only one of her three paternal aunts is alive. She indicated that both of her paternal uncles are deceased. She indicated that both of her cousins are deceased.   ALLERGIES:  has No Known Allergies.  MEDICATIONS:  Current Outpatient Prescriptions  Medication Sig Dispense Refill  . acetaminophen (TYLENOL) 325 MG tablet Take 650 mg by mouth every 6 (six) hours as needed.    Marland Kitchen albuterol (PROVENTIL HFA;VENTOLIN HFA) 108 (90 BASE) MCG/ACT inhaler Inhale 2 puffs into the lungs every 6 (six) hours as needed for wheezing or shortness of breath.    . ALPRAZolam (XANAX) 0.5 MG tablet Take 0.5 mg by mouth 3 (three) times daily as needed for anxiety.    . calcium carbonate (TUMS - DOSED IN MG ELEMENTAL CALCIUM) 500 MG chewable tablet Chew 1 tablet by  mouth 3 (three) times daily with meals.    . diphenhydramine-acetaminophen (TYLENOL PM) 25-500 MG TABS Take 2 tablets by mouth at bedtime.    . docusate sodium (COLACE) 100 MG capsule Take 100 mg by mouth 3 (three) times daily as needed for mild constipation.    . hydrochlorothiazide (MICROZIDE) 12.5 MG capsule Take 12.5 mg by mouth daily.    Marland Kitchen ibuprofen (ADVIL,MOTRIN) 200 MG tablet Take 200 mg by mouth every 6 (six) hours as needed.    . non-metallic deodorant Jethro Poling) MISC Apply 1 application topically daily as needed.    Marland Kitchen omeprazole (PRILOSEC) 40 MG capsule Take 1 capsule (40 mg total) by mouth daily. 30 capsule 4  . tiZANidine (ZANAFLEX) 4 MG tablet 4 mg as needed.     . Wound Dressings (RADIAGEL EX)  Apply 170 g topically.    . ergocalciferol (VITAMIN D2) 50000 UNITS capsule Take 1 capsule (50,000 Units total) by mouth once a week. 4 capsule 3  . exemestane (AROMASIN) 25 MG tablet Take 1 tablet (25 mg total) by mouth daily after breakfast. (Patient not taking: Reported on 06/09/2015) 30 tablet 3  . Na Sulfate-K Sulfate-Mg Sulf (SUPREP BOWEL PREP) SOLN Take 1 kit by mouth as directed. 1 Bottle 0  . potassium chloride SA (K-DUR,KLOR-CON) 20 MEQ tablet Take 1 tablet (20 mEq total) by mouth 2 (two) times daily. (Patient not taking: Reported on 04/15/2015) 60 tablet 1  . temazepam (RESTORIL) 15 MG capsule Take 1-2 capsules (15-30 mg total) by mouth at bedtime as needed for sleep. 60 capsule 5   No current facility-administered medications for this visit.    Review of Systems  Constitutional: Negative for fever, chills, weight loss and malaise/fatigue.  HENT: Negative for congestion, hearing loss, nosebleeds, sore throat and tinnitus.   Eyes: Negative for blurred vision, double vision, pain and discharge.  Respiratory: Negative for cough, hemoptysis, sputum production, shortness of breath and wheezing.  Cardiovascular: Negative for chest pain, palpitations, claudication, leg swelling and PND.    Gastrointestinal: Negative for heartburn, nausea, vomiting, abdominal pain, diarrhea, constipation, blood in stool and melena.  Genitourinary: Negative for dysuria, urgency, frequency and hematuria.  Musculoskeletal: Negative for myalgias, joint pain and falls.  Skin: Negative for itching and rash.  Neurological: Negative for dizziness, tingling, tremors, sensory change, speech change, focal weakness, seizures, loss of consciousness, weakness and headaches.  Endo/Heme/Allergies: Does not bruise/bleed easily.  Psychiatric/Behavioral: Negative for depression, suicidal ideas, memory loss and substance abuse. The patient is not nervous/anxious and does not have insomnia.   14 point review of systems was performed and is negative except as detailed under history of present illness and above   PHYSICAL EXAMINATION: ECOG PERFORMANCE STATUS: 0 - Asymptomatic  Filed Vitals:   06/04/15 1147  BP: 151/85  Pulse: 70  Temp: 98.3 F (36.8 C)  Resp: 20   Filed Weights   06/04/15 1147  Weight: 244 lb 9.6 oz (110.95 kg)   Physical Exam  Constitutional: She is oriented to person, place, and time and well-developed, well-nourished, and in no distress.   HENT:  Head: Normocephalic and atraumatic.  Nose: Nose normal.  Mouth/Throat: Oropharynx is clear and moist. No oropharyngeal exudate.  Eyes: Conjunctivae and EOM are normal. Pupils are equal, round, and reactive to light. Right eye exhibits no discharge. Left eye exhibits no discharge. No scleral icterus.  Neck: Normal range of motion. Neck supple. No tracheal deviation present. No thyromegaly present.  Cardiovascular: Normal rate, regular rhythm and normal heart sounds.  Exam reveals no gallop and no friction rub.   No murmur heard. Pulmonary/Chest: Effort normal and breath sounds normal. She has no wheezes. She has no rales.  Abdominal: Soft. Bowel sounds are normal. She exhibits no distension and no mass. There is no tenderness. There is no  rebound and no guarding.  Musculoskeletal: Normal range of motion. She exhibits no edema.  Breasts: Noted XRT changes but well healing, skin moist  Lymphadenopathy:    She has no cervical adenopathy.  Neurological: She is alert and oriented to person, place, and time. She has normal reflexes. No cranial nerve deficit. Gait normal. Coordination normal.  Skin: Skin is warm and dry. No rash noted.  Psychiatric: Mood, memory, affect and judgment normal.  Nursing note and vitals reviewed.   LABORATORY DATA:  I have  reviewed the data as listed Lab Results  Component Value Date   WBC 5.0 06/04/2015   HGB 16.2* 06/04/2015   HCT 45.5 06/04/2015   MCV 90.8 06/04/2015   PLT 157 06/04/2015     Chemistry      Component Value Date/Time   NA 139 06/04/2015 1059   K 3.5 06/04/2015 1059   CL 105 06/04/2015 1059   CO2 27 06/04/2015 1059   BUN 20 06/04/2015 1059   CREATININE 0.65 06/04/2015 1059      Component Value Date/Time   CALCIUM 9.3 06/04/2015 1059   ALKPHOS 61 06/04/2015 1059   AST 14* 06/04/2015 1059   ALT 14 06/04/2015 1059   BILITOT 1.3* 06/04/2015 1059      ASSESSMENT & PLAN:  Stage II, infiltrating ductal carcinoma of the left breast, ER positive, PR positive, HER-2 negative Anxiety Tobacco abuse Insomnia, difficulty falling asleep Constipation  She is ready to begin endocrine therapy.  She will need a DEXA and we will arrange this. She also agrees to referral for Colonoscopy as she has never had one.  She will return in 6 weeks after the start of therapy to assess tolerance. We discussed the risks and benefits of anti-estrogen therapy with aromatase inhibitors. These include but not limited to insomnia, hot flashes, mood changes, vaginal dryness, bone density loss, and weight gain.We strongly believe that the benefits far outweigh the risks. Patient understands these risks and consented to starting treatment. Planned treatment duration is 5 years. I reviewed her options for  therapy including arimidex, aromasin, and femara. She opted for aromasin. She was provided with a prescription. She was advised to continue on calcium and vitamin D.  We again addressed the importance of smoking cessation. I gave her several options to help her discontinue smoking. She would like to work on this by herself first. We will address this again at follow-up   Orders Placed This Encounter  Procedures  . DG Bone Density    Amy,  Esigned, Medicaid, no pac needed, nbs    Standing Status: Future     Number of Occurrences:      Standing Expiration Date: 06/03/2016    Order Specific Question:  Reason for Exam (SYMPTOM  OR DIAGNOSIS REQUIRED)    Answer:  high risk medication    Order Specific Question:  Preferred imaging location?    Answer:  [facility]  . Vitamin D 25 hydroxy    Standing Status: Future     Number of Occurrences:      Standing Expiration Date: 06/03/2016  . CBC with Differential    Standing Status: Future     Number of Occurrences:      Standing Expiration Date: 06/03/2016  . Comprehensive metabolic panel    Standing Status: Future     Number of Occurrences:      Standing Expiration Date: 06/03/2016    This note was signed electronically.    This document serves as a record of services personally performed by Ancil Linsey, MD. It was created on her behalf by Arlyce Harman, a trained medical scribe. The creation of this record is based on the scribe's personal observations and the provider's statements to them. This document has been checked and approved by the attending provider.  I have reviewed the above documentation for accuracy and completeness, and I agree with the above.  Kelby Fam. Deaundra Dupriest MD

## 2015-06-04 ENCOUNTER — Encounter (HOSPITAL_COMMUNITY): Payer: Medicaid Other | Attending: Oncology | Admitting: Hematology & Oncology

## 2015-06-04 ENCOUNTER — Encounter (HOSPITAL_COMMUNITY): Payer: Medicaid Other | Attending: Hematology & Oncology

## 2015-06-04 VITALS — BP 151/85 | HR 70 | Temp 98.3°F | Resp 20 | Wt 244.6 lb

## 2015-06-04 DIAGNOSIS — C50412 Malignant neoplasm of upper-outer quadrant of left female breast: Secondary | ICD-10-CM | POA: Insufficient documentation

## 2015-06-04 DIAGNOSIS — K5900 Constipation, unspecified: Secondary | ICD-10-CM

## 2015-06-04 DIAGNOSIS — C50912 Malignant neoplasm of unspecified site of left female breast: Secondary | ICD-10-CM

## 2015-06-04 DIAGNOSIS — G4700 Insomnia, unspecified: Secondary | ICD-10-CM

## 2015-06-04 DIAGNOSIS — Z720 Tobacco use: Secondary | ICD-10-CM

## 2015-06-04 DIAGNOSIS — E876 Hypokalemia: Secondary | ICD-10-CM | POA: Insufficient documentation

## 2015-06-04 DIAGNOSIS — Z79899 Other long term (current) drug therapy: Secondary | ICD-10-CM | POA: Diagnosis not present

## 2015-06-04 DIAGNOSIS — R319 Hematuria, unspecified: Secondary | ICD-10-CM | POA: Insufficient documentation

## 2015-06-04 LAB — COMPREHENSIVE METABOLIC PANEL
ALT: 14 U/L (ref 14–54)
AST: 14 U/L — ABNORMAL LOW (ref 15–41)
Albumin: 3.7 g/dL (ref 3.5–5.0)
Alkaline Phosphatase: 61 U/L (ref 38–126)
Anion gap: 7 (ref 5–15)
BUN: 20 mg/dL (ref 6–20)
CO2: 27 mmol/L (ref 22–32)
Calcium: 9.3 mg/dL (ref 8.9–10.3)
Chloride: 105 mmol/L (ref 101–111)
Creatinine, Ser: 0.65 mg/dL (ref 0.44–1.00)
GFR calc Af Amer: >60 mL/min (ref >60)
GFR calc non Af Amer: >60 mL/min (ref >60)
Glucose, Bld: 106 mg/dL — ABNORMAL HIGH (ref 65–99)
Potassium: 3.5 mmol/L (ref 3.5–5.1)
Sodium: 139 mmol/L (ref 135–145)
Total Bilirubin: 1.3 mg/dL — ABNORMAL HIGH (ref 0.3–1.2)
Total Protein: 6.8 g/dL (ref 6.5–8.1)

## 2015-06-04 LAB — CBC WITH DIFFERENTIAL/PLATELET
Basophils Absolute: 0 10*3/uL (ref 0.0–0.1)
Basophils Relative: 0 %
Eosinophils Absolute: 0.2 10*3/uL (ref 0.0–0.7)
Eosinophils Relative: 3 %
HCT: 45.5 % (ref 36.0–46.0)
Hemoglobin: 16.2 g/dL — ABNORMAL HIGH (ref 12.0–15.0)
Lymphocytes Relative: 16 %
Lymphs Abs: 0.8 10*3/uL (ref 0.7–4.0)
MCH: 32.3 pg (ref 26.0–34.0)
MCHC: 35.6 g/dL (ref 30.0–36.0)
MCV: 90.8 fL (ref 78.0–100.0)
Monocytes Absolute: 0.3 10*3/uL (ref 0.1–1.0)
Monocytes Relative: 7 %
Neutro Abs: 3.7 10*3/uL (ref 1.7–7.7)
Neutrophils Relative %: 74 %
Platelets: 157 10*3/uL (ref 150–400)
RBC: 5.01 MIL/uL (ref 3.87–5.11)
RDW: 13.2 % (ref 11.5–15.5)
WBC: 5 10*3/uL (ref 4.0–10.5)

## 2015-06-04 MED ORDER — EXEMESTANE 25 MG PO TABS: 25.0000 mg | ORAL_TABLET | Freq: Every day | ORAL | Status: DC

## 2015-06-04 MED ORDER — TEMAZEPAM 15 MG PO CAPS: 15.0000 mg | ORAL_CAPSULE | Freq: Every evening | ORAL | Status: DC | PRN

## 2015-06-04 NOTE — Patient Instructions (Signed)
[place] at [facility] Discharge Instructions  RECOMMENDATIONS MADE BY THE CONSULTANT AND ANY TEST RESULTS WILL BE SENT TO YOUR REFERRING PHYSICIAN.  Exam per Dr.Penland. You need a bone density scan. We will make a referral to Dr.Fields for colonoscopy. Someone from their office will call to schedule you an appointment. MD appointment again in 6 weeks. Prescription for Restoril given to patient. Return as scheduled.  Thank you for choosing [place] at [facility] to provide your oncology and hematology care.  To afford each patient quality time with our provider, please arrive at least 15 minutes before your scheduled appointment time.    You need to re-schedule your appointment should you arrive 10 or more minutes late.  We strive to give you quality time with our providers, and arriving late affects you and other patients whose appointments are after yours.  Also, if you no show three or more times for appointments you may be dismissed from the clinic at the providers discretion.     Again, thank you for choosing [facility].  Our hope is that these requests will decrease the amount of time that you wait before being seen by our physicians.       _____________________________________________________________  Should you have questions after your visit to [facility], please contact our office at [phone] between the hours of 8:30 a.m. and 4:30 p.m.  Voicemails left after 4:30 p.m. will not be returned until the following business day.  For prescription refill requests, have your pharmacy contact our office.

## 2015-06-04 NOTE — Progress Notes (Signed)
..  Regina Mcmahon's reason for visit today is for labs as scheduled per MD orders.  Venipuncture performed with a 23 gauge butterfly needle to R Antecubital.  Regina Mcmahon tolerated procedure well and without incident; questions were answered and patient was discharged.

## 2015-06-05 ENCOUNTER — Other Ambulatory Visit (HOSPITAL_COMMUNITY): Payer: Self-pay | Admitting: *Deleted

## 2015-06-05 LAB — VITAMIN D 25 HYDROXY (VIT D DEFICIENCY, FRACTURES): Vit D, 25-Hydroxy: 19.1 ng/mL — ABNORMAL LOW (ref 30.0–100.0)

## 2015-06-05 MED ORDER — ERGOCALCIFEROL 1.25 MG (50000 UT) PO CAPS: 50000.0000 [IU] | ORAL_CAPSULE | ORAL | Status: DC

## 2015-06-09 ENCOUNTER — Telehealth: Payer: Self-pay

## 2015-06-10 NOTE — Telephone Encounter (Signed)
FULL LIQUIDS DAY BEFORE STARTING WITH BREAKFAST. SUPREP.  Full Liquid Diet A high-calorie, high-protein supplement should be used to meet your nutritional requirements when the full liquid diet is continued for more than 2 or 3 days. If this diet is to be used for an extended period of time (more than 7 days), a multivitamin should be considered.  Breads and Starches  Allowed: None are allowed except crackersWHOLE OR pureed (made into a thick, smooth soup) in soup.   Avoid: Any others.    Potatoes/Pasta/Rice  Allowed: ANY ITEM AS A SOUP OR SMALL PLATE OF MASHED POTATOES OR SCRAMBLED EGGS.       Vegetables  Allowed: Strained tomato or vegetable juice. Vegetables pureed in soup.   Avoid: Any others.    Fruit  Allowed: Any strained fruit juices and fruit drinks. Include 1 serving of citrus or vitamin C-enriched fruit juice daily.   Avoid: Any others.  Meat and Meat Substitutes  Allowed: Egg  Avoid: Any meat, fish, or fowl. All cheese.  Milk  Allowed: SOY Milk beverages, including milk shakes and instant breakfast mixes. Smooth yogurt.   Avoid: Any others. Avoid dairy products if not tolerated.    Soups and Combination Foods  Allowed: Broth, strained cream soups. Strained, broth-based soups.   Avoid: Any others.    Desserts and Sweets  Allowed: flavored gelatin, tapioca, ice cream, sherbet, smooth pudding, junket, fruit ices, frozen ice pops, pudding pops, frozen fudge pops, chocolate syrup. Sugar, honey, jelly, syrup.   Avoid: Any others.  Fats and Oils  Allowed: Margarine, butter, cream, sour cream, oils.   Avoid: Any others.  Beverages  Allowed: All.   Avoid: None.  Condiments  Allowed: Iodized salt, pepper, spices, flavorings. Cocoa powder.   Avoid: Any others.    SAMPLE MEAL PLAN Breakfast   cup orange juice.   1 OR 2 EGGS  1 cup milk.   1 cup beverage (coffee or tea).   Cream or sugar, if desired.    Midmorning Snack  2  SCRAMBLED OR HARD BOILED EGG   Lunch  1 cup cream soup.    cup fruit juice.   1 cup milk.    cup custard.   1 cup beverage (coffee or tea).   Cream or sugar, if desired.    Midafternoon Snack  1 cup milk shake.  Dinner  1 cup cream soup.    cup fruit juice.   1 cup MILK    cup pudding.   1 cup beverage (coffee or tea).   Cream or sugar, if desired.  Evening Snack  1 cup supplement.  To increase calories, add sugar, cream, butter, or margarine if possible. Nutritional supplements will also increase the total calories.

## 2015-06-10 NOTE — Telephone Encounter (Signed)
Gastroenterology Pre-Procedure Review  Request Date: 06/09/2015 Requesting Physician: Dr. Whitney Muse  PATIENT REVIEW QUESTIONS: The patient responded to the following health history questions as indicated:    Pt said her last colonoscopy was done in [place] about 10 years ago She has family hx of colon cancer in her brother  1. Diabetes Melitis: no 2. Joint replacements in the past 12 months: no 3. Major health problems in the past 3 months: no 4. Has an artificial valve or MVP: no 5. Has a defibrillator: no 6. Has been advised in past to take antibiotics in advance of a procedure like teeth cleaning: no 7. Family history of colon cancer: YES 8. Alcohol Use: no 9. History of sleep apnea: no     MEDICATIONS & ALLERGIES:    Patient reports the following regarding taking any blood thinners:   Plavix? no Aspirin? no Coumadin? no  Patient confirms/reports the following medications:  Current Outpatient Prescriptions  Medication Sig Dispense Refill  . acetaminophen (TYLENOL) 325 MG tablet Take 650 mg by mouth every 6 (six) hours as needed.    Marland Kitchen albuterol (PROVENTIL HFA;VENTOLIN HFA) 108 (90 BASE) MCG/ACT inhaler Inhale 2 puffs into the lungs every 6 (six) hours as needed for wheezing or shortness of breath.    . ALPRAZolam (XANAX) 0.5 MG tablet Take 0.5 mg by mouth 3 (three) times daily as needed for anxiety.    . calcium carbonate (TUMS - DOSED IN MG ELEMENTAL CALCIUM) 500 MG chewable tablet Chew 1 tablet by mouth 3 (three) times daily with meals.    . ergocalciferol (VITAMIN D2) 50000 UNITS capsule Take 1 capsule (50,000 Units total) by mouth once a week. 4 capsule 3  . hydrochlorothiazide (MICROZIDE) 12.5 MG capsule Take 12.5 mg by mouth daily.    Marland Kitchen ibuprofen (ADVIL,MOTRIN) 200 MG tablet Take 200 mg by mouth every 6 (six) hours as needed.    Marland Kitchen omeprazole (PRILOSEC) 40 MG capsule Take 1 capsule (40 mg total) by mouth daily. 30 capsule 4  . temazepam (RESTORIL) 15 MG capsule Take 1-2  capsules (15-30 mg total) by mouth at bedtime as needed for sleep. 60 capsule 5  . diphenhydramine-acetaminophen (TYLENOL PM) 25-500 MG TABS Take 2 tablets by mouth at bedtime.    . docusate sodium (COLACE) 100 MG capsule Take 100 mg by mouth 3 (three) times daily as needed for mild constipation.    Marland Kitchen exemestane (AROMASIN) 25 MG tablet Take 1 tablet (25 mg total) by mouth daily after breakfast. (Patient not taking: Reported on 06/09/2015) 30 tablet 3  . non-metallic deodorant (ALRA) MISC Apply 1 application topically daily as needed.    . potassium chloride SA (K-DUR,KLOR-CON) 20 MEQ tablet Take 1 tablet (20 mEq total) by mouth 2 (two) times daily. (Patient not taking: Reported on 04/15/2015) 60 tablet 1  . tiZANidine (ZANAFLEX) 4 MG tablet 4 mg as needed.     . Wound Dressings (RADIAGEL EX) Apply 170 g topically.     No current facility-administered medications for this visit.    Patient confirms/reports the following allergies:  No Known Allergies  No orders of the defined types were placed in this encounter.    AUTHORIZATION INFORMATION Primary Insurance:  ID #: Group #:  Pre-Cert / Auth required:  Pre-Cert / Auth #:   Secondary Insurance:  ID #:  Group #:  Pre-Cert / Auth required:  Pre-Cert / Auth #:   SCHEDULE INFORMATION: Procedure has been scheduled as follows:  Date: 07/13/2015  Time: 11:30 am Location: [facility] Short Stay  This Gastroenterology Pre-Precedure Review Form is being routed to the following provider(s): Barney Drain, MD

## 2015-06-12 ENCOUNTER — Other Ambulatory Visit: Payer: Self-pay

## 2015-06-12 DIAGNOSIS — Z1211 Encounter for screening for malignant neoplasm of colon: Secondary | ICD-10-CM

## 2015-06-12 MED ORDER — NA SULFATE-K SULFATE-MG SULF 17.5-3.13-1.6 GM/177ML PO SOLN: 1.0000 | ORAL | Status: DC

## 2015-06-12 NOTE — Telephone Encounter (Signed)
Rx sent to the pharmacy and instructions mailed to pt.  

## 2015-06-29 ENCOUNTER — Telehealth: Payer: Self-pay

## 2015-06-29 NOTE — Telephone Encounter (Signed)
I went online and per Medicaid, [place] not required for the CPT code [phone].

## 2015-06-30 ENCOUNTER — Encounter (HOSPITAL_COMMUNITY): Payer: Self-pay | Admitting: Hematology & Oncology

## 2015-07-01 ENCOUNTER — Ambulatory Visit (HOSPITAL_COMMUNITY)
Admission: RE | Admit: 2015-07-01 | Discharge: 2015-07-01 | Disposition: A | Discharge status: Home / Self Care | Payer: Medicaid Other | Source: Ambulatory Visit | Attending: Hematology & Oncology | Admitting: Hematology & Oncology

## 2015-07-01 DIAGNOSIS — C50412 Malignant neoplasm of upper-outer quadrant of left female breast: Secondary | ICD-10-CM

## 2015-07-01 DIAGNOSIS — M8580 Other specified disorders of bone density and structure, unspecified site: Secondary | ICD-10-CM | POA: Insufficient documentation

## 2015-07-01 DIAGNOSIS — Z79899 Other long term (current) drug therapy: Secondary | ICD-10-CM

## 2015-07-02 ENCOUNTER — Encounter (HOSPITAL_COMMUNITY): Payer: Medicaid Other

## 2015-07-06 ENCOUNTER — Telehealth: Payer: Self-pay

## 2015-07-06 NOTE — Telephone Encounter (Signed)
LMOM for pt to call to update triage meds prior to colonoscopy on 07/13/2015.

## 2015-07-06 NOTE — Telephone Encounter (Signed)
Pt called back and there have been no changes in her meds.

## 2015-07-07 NOTE — Telephone Encounter (Signed)
REVIEWED-NO ADDITIONAL RECOMMENDATIONS. 

## 2015-07-09 ENCOUNTER — Encounter: Payer: Self-pay | Admitting: *Deleted

## 2015-07-09 ENCOUNTER — Ambulatory Visit
Admission: RE | Admit: 2015-07-09 | Discharge: 2015-07-09 | Disposition: A | Discharge status: Home / Self Care | Payer: Medicaid Other | Source: Ambulatory Visit | Attending: Radiation Oncology | Admitting: Radiation Oncology

## 2015-07-09 VITALS — BP 154/84 | HR 77 | Temp 97.8°F | Resp 12 | Wt 245.7 lb

## 2015-07-09 DIAGNOSIS — C50412 Malignant neoplasm of upper-outer quadrant of left female breast: Secondary | ICD-10-CM

## 2015-07-09 NOTE — Progress Notes (Signed)
Called pt to verify they were coming to their appointment with Dr Pablo Ledger. Called number listed.  No answer.

## 2015-07-09 NOTE — Progress Notes (Addendum)
She rates her pain as a 4 on a scale of 0-10. intermittent and dull over joint. Pt complains of fatigue and weakness.  Pt left breast- warm dry and intact.  Pt denies edema. Denies using any cream to breast site. BP 154/84 mmHg  Pulse 77  Temp(Src) 97.8 F (36.6 C) (Oral)  Resp 12  Wt 245 lb 11.2 oz (111.449 kg)  SpO2 95%  Pt reports: Yes No Comments  Tamoxifen '[]'$  '[x]'$    Arimidex '[]'$  '[x]'$    Mammogram '[x]'$   Date:07/02/15 '[]'$    Last Med Onc; [place] 06/03/15 next: 07/21/15

## 2015-07-09 NOTE — Progress Notes (Signed)
   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  Left supraclavicular fossa and axilla/ 45 Gy at 1.8 Gy per fraction x 25 fractions Left breast boost/ 16 Gy at 2 Gy per fraction x 8 fractions  Interval History: Regina Mcmahon presents today for routine followup. She was seen by Dr. Whitney Muse in November. She has discussed and started anti-estrogen therapy. She is scheduled for a care plan visit on 12/22 and will follow up with Dr. Whitney Muse on 12/27. Her skin has healed well from radiation, but unfortunately she continues to smoke. She rates her joint pain as a 4 on a scale of 0 to 10. She complains of fatigue and weakness. She denies using any creams in the treatment area.  Physical Exam:  Filed Vitals:   07/09/15 1341  BP: 154/84  Pulse: 77  Temp: 97.8 F (36.6 C)  TempSrc: Oral  Resp: 12  Weight: 245 lb 11.2 oz (111.449 kg)  SpO2: 95%  Her skin is well healed. She has minimal hyperpigmentation over the left breast.  IMPRESSION: Regina Mcmahon is a 64 y.o. female with T2N1 left breast cancer.  PLAN: She is doing well. We discussed the need for follow up every 4-6 months which she has scheduled.  We discussed the need for yearly mammograms which she can schedule with her OBGYN or with medical oncology. She will discuss her concerns regarding antiestrogen treatment with Dr. Whitney Muse. I encouraged her to use lotion with Vitamin E.  We discussed the need for sun protection in the treated area.  She can always call me with questions.  I will follow up with her on an  as needed basis. The patient was given a "Life After Cancer for Every Survivor" booklet during the encounter.   Thea Silversmith, MD  This document serves as a record of services personally performed by Thea Silversmith, MD. It was created on her behalf by Darcus Austin, a trained medical scribe. The creation of this record is based on the scribe's personal observations and the provider's statements to them. This document has been checked and approved by the attending provider.

## 2015-07-13 ENCOUNTER — Telehealth: Payer: Self-pay

## 2015-07-13 ENCOUNTER — Ambulatory Visit (HOSPITAL_COMMUNITY): Admission: RE | Admit: 2015-07-13 | Payer: Medicaid Other | Source: Ambulatory Visit | Admitting: Gastroenterology

## 2015-07-13 ENCOUNTER — Encounter (HOSPITAL_COMMUNITY): Admission: RE | Payer: Self-pay | Source: Ambulatory Visit

## 2015-07-13 SURGERY — COLONOSCOPY
Anesthesia: Moderate Sedation

## 2015-07-13 NOTE — Telephone Encounter (Signed)
Per Ginger, someone called from Endo and said pt had a family emergency and could not do her colonoscpy. Regina Mcmahon she will reschedule in January 2017. I have mailed a letter for her to call when she is ready to schedule. Also, on my list to call if I do not hear from her.

## 2015-07-13 NOTE — Telephone Encounter (Signed)
REVIEWED-NO ADDITIONAL RECOMMENDATIONS. 

## 2015-07-16 ENCOUNTER — Encounter: Payer: Medicaid Other | Admitting: Nurse Practitioner

## 2015-07-17 ENCOUNTER — Telehealth: Payer: Self-pay | Admitting: Nurse Practitioner

## 2015-07-17 NOTE — Telephone Encounter (Signed)
Called and left message for patient regarding missed visit on 07/16/2015 for survivorship care plan review.  Offered to reschedule appointment vs. mailing survivorship care plan to patient.  Will await return call from patient.

## 2015-07-21 ENCOUNTER — Ambulatory Visit (HOSPITAL_COMMUNITY): Payer: Medicaid Other | Admitting: Hematology & Oncology

## 2015-07-21 ENCOUNTER — Telehealth (HOSPITAL_COMMUNITY): Payer: Self-pay

## 2015-07-21 NOTE — Telephone Encounter (Signed)
Per patient she isn't tolerating the Aromasin.  States "when I take it my heart rate and BP go up, I'm anxious all the time and more depressed and am having to take more xanax and I get nauseated whenever I take it.  All of my joints ache as well.  I stopped it over Christmas and the nausea went away but the depression was still there.  I'm just not myself when I take it."

## 2015-07-21 NOTE — Telephone Encounter (Signed)
-----   Message from Louis Meckel sent at 07/21/2015 10:53 AM EST ----- Regarding: having problems with med Patient is having some side effects from taking her aromasin.  Please call her to discuss issues.  Thanks

## 2015-07-21 NOTE — Telephone Encounter (Signed)
Discussed with Dr. Whitney Muse patient's concerns with the Aromasin and patient instructed to stop Aromasin until seen in clinic on 08/06/14 and other options.  Verbalized

## 2015-07-23 ENCOUNTER — Encounter: Payer: Self-pay | Admitting: Nurse Practitioner

## 2015-07-23 DIAGNOSIS — C50412 Malignant neoplasm of upper-outer quadrant of left female breast: Secondary | ICD-10-CM

## 2015-07-23 NOTE — Progress Notes (Signed)
The Survivorship Care Plan was mailed to Regina Mcmahon as she was unable to come in to the Survivorship Clinic for an in-person visit at this time. A letter was mailed to her outlining the purpose of the content of the care plan, as well as encouraging her to reach out to me with any questions or concerns.  My business card was included in the correspondence to the patient as well.  A copy of the care plan was also routed/faxed/mailed to HAWKINS,EDWARD L, MD, the patient's PCP.  I will not be placing any follow-up appointments to the Survivorship Clinic for Regina Mcmahon, but I am happy to see her at any time in the future for any survivorship concerns that may arise. Thank you for allowing me to participate in her care!  Kenn File, [place] [phone]

## 2015-08-07 ENCOUNTER — Other Ambulatory Visit (HOSPITAL_COMMUNITY): Payer: Self-pay | Admitting: Hematology & Oncology

## 2015-08-07 ENCOUNTER — Encounter (HOSPITAL_COMMUNITY): Payer: Self-pay | Admitting: Hematology & Oncology

## 2015-08-07 ENCOUNTER — Encounter (HOSPITAL_COMMUNITY): Payer: Medicaid Other | Attending: Oncology | Admitting: Hematology & Oncology

## 2015-08-07 VITALS — BP 123/72 | HR 70 | Temp 97.5°F | Resp 20 | Wt 246.0 lb

## 2015-08-07 DIAGNOSIS — G4700 Insomnia, unspecified: Secondary | ICD-10-CM

## 2015-08-07 DIAGNOSIS — E876 Hypokalemia: Secondary | ICD-10-CM | POA: Insufficient documentation

## 2015-08-07 DIAGNOSIS — K5900 Constipation, unspecified: Secondary | ICD-10-CM

## 2015-08-07 DIAGNOSIS — Z170 Estrogen receptor positive status [ER+]: Secondary | ICD-10-CM

## 2015-08-07 DIAGNOSIS — Z1231 Encounter for screening mammogram for malignant neoplasm of breast: Secondary | ICD-10-CM

## 2015-08-07 DIAGNOSIS — C773 Secondary and unspecified malignant neoplasm of axilla and upper limb lymph nodes: Secondary | ICD-10-CM | POA: Diagnosis not present

## 2015-08-07 DIAGNOSIS — M8580 Other specified disorders of bone density and structure, unspecified site: Secondary | ICD-10-CM | POA: Diagnosis not present

## 2015-08-07 DIAGNOSIS — F419 Anxiety disorder, unspecified: Secondary | ICD-10-CM

## 2015-08-07 DIAGNOSIS — Z720 Tobacco use: Secondary | ICD-10-CM

## 2015-08-07 DIAGNOSIS — Z1379 Encounter for other screening for genetic and chromosomal anomalies: Secondary | ICD-10-CM

## 2015-08-07 DIAGNOSIS — C50412 Malignant neoplasm of upper-outer quadrant of left female breast: Secondary | ICD-10-CM

## 2015-08-07 DIAGNOSIS — R319 Hematuria, unspecified: Secondary | ICD-10-CM | POA: Insufficient documentation

## 2015-08-07 DIAGNOSIS — Z9889 Other specified postprocedural states: Secondary | ICD-10-CM

## 2015-08-07 DIAGNOSIS — Z79899 Other long term (current) drug therapy: Secondary | ICD-10-CM

## 2015-08-07 MED ORDER — ANASTROZOLE 1 MG PO TABS: 1.0000 mg | ORAL_TABLET | Freq: Every day | ORAL | Status: DC

## 2015-08-07 NOTE — Progress Notes (Signed)
[place] Progress Note  Patient Care Team: Sinda Du, MD as PCP - General (Internal Medicine) Patrici Ranks, MD as Consulting Physician (Hematology and Oncology) Erroll Luna, MD as Consulting Physician (General Surgery) Thea Silversmith, MD as Consulting Physician (Radiation Oncology) Sylvan Cheese, NP as Nurse Practitioner (Hematology and Oncology)  CHIEF COMPLAINTS/PURPOSE OF CONSULTATION:  L breast cancer History of L breast core biopsy for microcalcifications in 2014, fibroadenoma. CMF cycle 1 on 10/02/2014 No matching staging information was found for the patient.  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    06/24/2014 Breast US Left breast: ill-defined predominantly hypoechoic area with some increased echogenicity in the 2 o'clock position of the left breast, 7 cm from the nipple. 2.6 x 2.3 x 1.8 cm in maximum dimensions.   07/01/2014 Initial Biopsy Left breast core needle bx: Invasive mammary carcinoma with lobular features, ER+ (100%), PR+ (91%), HER2/neu negative (ratio 1.09), Ki-67 32%. E-cadherin strongly positive, diagnostic for IDC    07/01/2014 Clinical Stage Stage IIA: T2 N0   08/20/2014 Definitive Surgery Left breast seed localized lumpectomy/SLNB: IDC, +LVI, DCIS, 1 LN removed and positive for metastatic carcinoma. Grade 2. HER2/neu repeated and negative (ratio 0.69-1.9).    08/20/2014 Pathologic Stage Stage IIB: pT2 pN1a pMx   09/17/2014 Surgery Port-a cath placement and re-excision   10/02/2014 - 01/15/2015 Chemotherapy CMF x 6  cycles.  patient refused Taxane-containing chemotherapy.   03/07/2015 Procedure Comp Cancer Panel reveals no variant at APC, ATM, AXIN2, BARD1, BMPR1A, BRCA1, BRCA2, BRIP1, CDH1, CDK4, CDKN2A, CHEK2, FANCC, MLH1, MSH2, MSH6, MUTYH, NBN, PALB2, PMS2, POLD1, POLE, PTEN, RAD51C, RAD51D, SMAD4, STK11, TP53, VHL, and XRCC2.   04/06/2015 - 05/21/2015 Radiation Therapy Adjuvant RT Pablo Ledger): Left breast/ 45 Gy over 25 fractions.  Left supraclavicular fossa and axilla/ 45 Gy over 25 fractions. Left breast boost/ 16 Gy over 8 fractions. Total dose: 61 Gy   06/04/2015 -  Anti-estrogen oral therapy Exemestane 25 mg daily.  Planned duration of therapy 5 years.    07/23/2015 Survivorship Survivorship care plan completed and mailed to patient in lieu of in person visit    HISTORY OF PRESENTING ILLNESS:  Regina Mcmahon 65 y.o. 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 She has completed chemotherapy without any significant complication. She has completed XRT.  Regina Mcmahon returns to the [place] alone today. She is prepared for her breast exam today, wearing a hospital gown.  She says that her Christmas was not good and that she cried the whole time due to her aromatase inhibitor pill, aromasin. She says she's had hot flashes one right after the other, and notes severe mood swings. She also implies that she is experiencing severe intimate dryness. Over the holidays, she remarks that she would "just sit and wring her hands constantly, anxiety after anxiety." She says her symptoms have been better since she's been off of the pills.  She says this alarmed her as "I'm always happy go  lucky, joking all the time," and "I had to actually double take my Xanax to even cope, and that was not even working -- and I need them little things."  In terms of her smoking, she implies that she hasn't made progress toward cessation. 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 Regina Mcmahon adds that she knows she needs to quit because she's also "cigarette poor." She says that a carton of cigarettes will last her about 5-6 days, and costs her about $36. She mentions that cessation has been difficult for her since "I have trouble getting on a diet, much less quitting my cigarettes."  She says that her L breast swells sometimes, and gets "really tight right there at the nipple part," and that "sometimes the nipple will sink in." She says "right now it's not, but it's just very hard."  When asked how her family is doing, she reports that they are well.  Toward the end of the appointment, she remarks "I'm very short of breath," which she also knows isn't a surprise, given her smoking. She reports that this tends to occur when she is walking around.  She reports that she still has a little trouble with her bowels, and has not yet had a colonoscopy. As Regina Mcmahon reports, she didn't have a ride to go to GI when she was last scheduled.   MEDICAL HISTORY:  Past Medical History  Diagnosis Date  . Hypertension   . Panic attacks   . Chronic airway obstruction ([place])   . Pain     Hx.pain in joint involving pelvic region and thigh; pain in limb  . Sinusitis   . Asthma   . Palpitations   . Hemorrhage rectum     and anus.  . Symptomatic menopausal or female climacteric states   . Asymptomatic varicose veins   . Other symptoms involving digestive system(787.99)   . Depression   . Breast cancer ([place]) 06/2014    left  . Insomnia     SURGICAL HISTORY: Past Surgical History  Procedure Laterality Date  . Cesarean section    . Cholecystectomy  1985  . Tubal ligation    . Colonoscopy    . Radioactive seed guided mastectomy with axillary  sentinel lymph node biopsy Left 08/20/2014    Procedure: LEFT BREAST LUMPECTOMY WITH RADIOACTIVE SEED LOCALIZATION AND SENTINEL LYMPH NODE MAPPING;  Surgeon: Erroll Luna, MD;  Location: [place];  Service: General;  Laterality: Left;  . Portacath placement Right 09/17/2014    Procedure: INSERTION PORT-A-CATH WITH ULTRASOUND;  Surgeon: Erroll Luna, MD;  Location: [place];  Service: General;  Laterality: Right;  IJ  . Re-excision of breast lumpectomy Left 09/17/2014    Procedure: RE-EXCISION OF BREAST LUMPECTOMY;  Surgeon: Erroll Luna, MD;  Location: [place];  Service: General;  Laterality: Left;  . Port-a-cath removal  02/2015    SOCIAL HISTORY: Social History   Social History  . Marital Status: Divorced    Spouse Name: N/A  . Number of Children: 2  . Years of Education: N/A   Occupational History  . Not on file.   Social History Main Topics  . Smoking status: Current Every Day Smoker --  2.00 packs/day    Types: Cigarettes  . Smokeless tobacco: Never Used  . Alcohol Use: No  . Drug Use: No  . Sexual Activity: No   Other Topics Concern  . Not on file   Social History Narrative  Smokes 2 PPD   FAMILY HISTORY: Family History  Problem Relation Age of Onset  . Diabetes Mother   . Ovarian cancer Mother 63  . Other Father     died in MVA at age 62  . Other Sister     mitral valve disorder  . Melanoma Sister 54    removed from back of leg  . Colon cancer Brother   . Cancer Paternal Aunt     leukemia, bone, or lung cancer  . Alzheimer's disease Maternal Grandmother   . Heart attack Maternal Grandfather   . Heart attack Paternal Grandmother   . COPD Paternal Grandfather   . Emphysema Paternal Grandfather   . Congestive Heart Failure Paternal Aunt   . Stomach cancer Paternal Uncle   . Kidney cancer Maternal Uncle   . Cancer Cousin     dx. teens/dx. 40s  . Cancer Cousin   . Colon cancer Cousin    indicated that  her mother is deceased. She indicated that her father is deceased. She indicated that her sister is alive. She indicated that her brother is deceased. She indicated that her maternal grandmother is deceased. She indicated that her maternal grandfather is deceased. She indicated that her paternal grandmother is deceased. She indicated that her paternal grandfather is deceased. She indicated that her daughter is alive. She indicated that her son is alive. She indicated that only one of her two maternal aunts is alive. She indicated that only one of her three maternal uncles is alive. She indicated that only one of her three paternal aunts is alive. She indicated that both of her paternal uncles are deceased. She indicated that both of her cousins are deceased.   ALLERGIES:  has No Known Allergies.  MEDICATIONS:  Current Outpatient Prescriptions  Medication Sig Dispense Refill  . acetaminophen (TYLENOL) 325 MG tablet Take 650 mg by mouth every 6 (six) hours as needed.    Marland Kitchen albuterol (PROVENTIL HFA;VENTOLIN HFA) 108 (90 BASE) MCG/ACT inhaler Inhale 2 puffs into the lungs every 6 (six) hours as needed for wheezing or shortness of breath.    . ALPRAZolam (XANAX) 0.5 MG tablet Take 0.5 mg by mouth 3 (three) times daily as needed for anxiety.    . calcium carbonate (TUMS - DOSED IN MG ELEMENTAL CALCIUM) 500 MG chewable tablet Chew 1 tablet by mouth 3 (three) times daily with meals.    . diphenhydramine-acetaminophen (TYLENOL PM) 25-500 MG TABS Take 2 tablets by mouth at bedtime.    . docusate sodium (COLACE) 100 MG capsule Take 100 mg by mouth 3 (three) times daily as needed for mild constipation.    . ergocalciferol (VITAMIN D2) 50000 UNITS capsule Take 1 capsule (50,000 Units total) by mouth once a week. 4 capsule 3  . hydrochlorothiazide (MICROZIDE) 12.5 MG capsule Take 12.5 mg by mouth daily.    Marland Kitchen ibuprofen (ADVIL,MOTRIN) 200 MG tablet Take 200 mg by mouth every 6 (six) hours as needed.    . Na Sulfate-K  Sulfate-Mg Sulf (SUPREP BOWEL PREP) SOLN Take 1 kit by mouth as directed. 1 Bottle 0  . non-metallic deodorant (ALRA) MISC Apply 1 application topically daily as needed.    Marland Kitchen omeprazole (PRILOSEC) 40 MG capsule Take 1 capsule (  40 mg total) by mouth daily. 30 capsule 4  . potassium chloride SA (K-DUR,KLOR-CON) 20 MEQ tablet Take 1 tablet (20 mEq total) by mouth 2 (two) times daily. 60 tablet 1  . temazepam (RESTORIL) 15 MG capsule Take 1-2 capsules (15-30 mg total) by mouth at bedtime as needed for sleep. 60 capsule 5  . tiZANidine (ZANAFLEX) 4 MG tablet 4 mg as needed.     Marland Kitchen anastrozole (ARIMIDEX) 1 MG tablet Take 1 tablet (1 mg total) by mouth daily. 30 tablet 3  . exemestane (AROMASIN) 25 MG tablet Take 1 tablet (25 mg total) by mouth daily after breakfast. (Patient not taking: Reported on 06/09/2015) 30 tablet 3   No current facility-administered medications for this visit.    Review of Systems  Constitutional: Negative for fever, chills, weight loss and malaise/fatigue.  HENT: Negative for congestion, hearing loss, nosebleeds, sore throat and tinnitus.   Eyes: Negative for blurred vision, double vision, pain and discharge.  Respiratory: Negative for cough, hemoptysis, sputum production, shortness of breath and wheezing.  Cardiovascular: Negative for chest pain, palpitations, claudication, leg swelling and PND.  Gastrointestinal: Negative for heartburn, nausea, vomiting, abdominal pain, diarrhea, constipation, blood in stool and melena.  Genitourinary: Negative for dysuria, urgency, frequency and hematuria.  Musculoskeletal: Negative for myalgias, joint pain and falls.  Skin: Negative for itching and rash.  Neurological: Negative for dizziness, tingling, tremors, sensory change, speech change, focal weakness, seizures, loss of consciousness, weakness and headaches.  Endo/Heme/Allergies: Does not bruise/bleed easily.  Psychiatric/Behavioral: Negative for depression, suicidal ideas, memory  loss and substance abuse. The patient is not nervous/anxious and does not have insomnia.   14 point review of systems was performed and is negative except as detailed under history of present illness and above   PHYSICAL EXAMINATION: ECOG PERFORMANCE STATUS: 0 - Asymptomatic  Filed Vitals:   08/07/15 0912  BP: 123/72  Pulse: 70  Temp: 97.5 F (36.4 C)  Resp: 20   Filed Weights   08/07/15 0912  Weight: 246 lb (111.585 kg)   Physical Exam  Constitutional: She is oriented to person, place, and time and well-developed, well-nourished, and in no distress.   HENT:  Head: Normocephalic and atraumatic.  Nose: Nose normal.  Mouth/Throat: Oropharynx is clear and moist. No oropharyngeal exudate.  Eyes: Conjunctivae and EOM are normal. Pupils are equal, round, and reactive to light. Right eye exhibits no discharge. Left eye exhibits no discharge. No scleral icterus.  Neck: Normal range of motion. Neck supple. No tracheal deviation present. No thyromegaly present.  Cardiovascular: Normal rate, regular rhythm and normal heart sounds.  Exam reveals no gallop and no friction rub.   No murmur heard. Pulmonary/Chest: Effort normal and breath sounds normal. She has no wheezes. She has no rales.  Abdominal: Soft. Bowel sounds are normal. She exhibits no distension and no mass. There is no tenderness. There is no rebound and no guarding.  Musculoskeletal: Normal range of motion. She exhibits no edema.  Breasts: L breast exhibits radiation changes, thickening of the skin around the nipple; firmness to the breast, post-radiation related; no palpable discrete mass. Mild edema; nothing atypical. Well-healed incision site that runs from 1 o'clock to 3 o'clock, about 5 cm from the nipple. R breast, benign exam. Lymphadenopathy:    She has no cervical adenopathy.  Neurological: She is alert and oriented to person, place, and time. She has normal reflexes. No cranial nerve deficit. Gait normal. Coordination  normal.  Skin: Skin is warm and dry. No  rash noted.  Psychiatric: Mood, memory, affect and judgment normal.  Nursing note and vitals reviewed.   LABORATORY DATA:  I have reviewed the data as listed Lab Results  Component Value Date   WBC 5.0 06/04/2015   HGB 16.2* 06/04/2015   HCT 45.5 06/04/2015   MCV 90.8 06/04/2015   PLT 157 06/04/2015     Chemistry      Component Value Date/Time   NA 139 06/04/2015 1059   K 3.5 06/04/2015 1059   CL 105 06/04/2015 1059   CO2 27 06/04/2015 1059   BUN 20 06/04/2015 1059   CREATININE 0.65 06/04/2015 1059      Component Value Date/Time   CALCIUM 9.3 06/04/2015 1059   ALKPHOS 61 06/04/2015 1059   AST 14* 06/04/2015 1059   ALT 14 06/04/2015 1059   BILITOT 1.3* 06/04/2015 1059      ASSESSMENT & PLAN:  Stage II, infiltrating ductal carcinoma of the left breast, ER positive, PR positive, HER-2 negative Anxiety Tobacco abuse Insomnia, difficulty falling asleep Constipation DEXA  07/01/2015 with osteopenia  She still uses [employer]. I will put her on Arimidex. If she's not doing well on that at follow-up, we will put her on Tamoxifen instead.  She has a mammogram ordered/scheduled. I have referred her back to GI for screening GI.  We will see her back in 4 weeks and see how she's been doing on the Arimidex. If she is doing well we will discuss prolia or a bisphosphonate for her osteopenia while on an AI. She is to continue on calcium and vitamin D.  She continues to smoke and I have continued to discuss cessation with her.  We will continue to work on this moving forward.  This note was signed electronically.    This document serves as a record of services personally performed by Ancil Linsey, MD. It was created on her behalf by Toni Amend, a trained medical scribe. The creation of this record is based on the scribe's personal observations and the provider's statements to them. This document has been checked and approved  by the attending provider.  I have reviewed the above documentation for accuracy and completeness, and I agree with the above.  Kelby Fam. Kayliah Tindol MD

## 2015-08-07 NOTE — Patient Instructions (Signed)
[place] at [facility] Discharge Instructions  RECOMMENDATIONS MADE BY THE CONSULTANT AND ANY TEST RESULTS WILL BE SENT TO YOUR REFERRING PHYSICIAN.  Exam and discussion today with Dr. Whitney Muse. Prescription for Arimidex sent to your pharmacy.  Referral to GI for screening colonoscopy. Follow up with Dr. Whitney Muse in 4 weeks.  Thank you for choosing [place] at [facility] to provide your oncology and hematology care.  To afford each patient quality time with our provider, please arrive at least 15 minutes before your scheduled appointment time.    You need to re-schedule your appointment should you arrive 10 or more minutes late.  We strive to give you quality time with our providers, and arriving late affects you and other patients whose appointments are after yours.  Also, if you no show three or more times for appointments you may be dismissed from the clinic at the providers discretion.     Again, thank you for choosing [facility].  Our hope is that these requests will decrease the amount of time that you wait before being seen by our physicians.       _____________________________________________________________  Should you have questions after your visit to [facility], please contact our office at [phone] between the hours of 8:30 a.m. and 4:30 p.m.  Voicemails left after 4:30 p.m. will not be returned until the following business day.  For prescription refill requests, have your pharmacy contact our office.

## 2015-08-13 ENCOUNTER — Ambulatory Visit (HOSPITAL_COMMUNITY): Payer: Medicaid Other

## 2015-08-18 ENCOUNTER — Other Ambulatory Visit (HOSPITAL_COMMUNITY): Payer: Self-pay | Admitting: Hematology & Oncology

## 2015-08-25 ENCOUNTER — Ambulatory Visit (HOSPITAL_COMMUNITY)
Admission: RE | Admit: 2015-08-25 | Discharge: 2015-08-25 | Disposition: A | Discharge status: Home / Self Care | Payer: Medicaid Other | Source: Ambulatory Visit | Attending: Hematology & Oncology | Admitting: Hematology & Oncology

## 2015-08-25 ENCOUNTER — Other Ambulatory Visit (HOSPITAL_COMMUNITY): Payer: Self-pay | Admitting: Hematology & Oncology

## 2015-08-25 DIAGNOSIS — C50412 Malignant neoplasm of upper-outer quadrant of left female breast: Secondary | ICD-10-CM

## 2015-08-25 DIAGNOSIS — Z9889 Other specified postprocedural states: Secondary | ICD-10-CM

## 2015-08-25 IMAGING — MG MM AP DIAG 20 MIN
6 of 9 series · 6 of 25 positions shown · non-contrast
Comparison: Previous exam(s).

CLINICAL DATA: 64-year-old female with left breast cancer post
lumpectomy [DATE].

EXAM:
DIGITAL DIAGNOSTIC LEFT MAMMOGRAM WITH 3D TOMOSYNTHESIS AND CAD
LEFT BREAST ULTRASOUND

[L CC (1 of 2)]
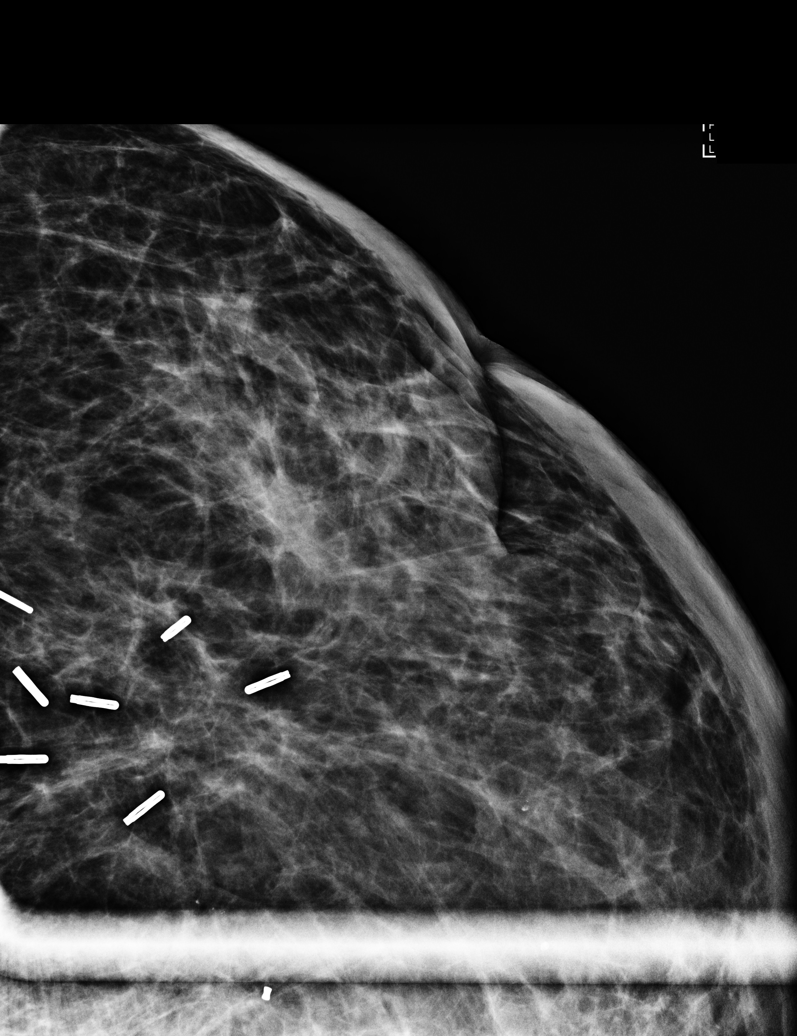

[R CC]
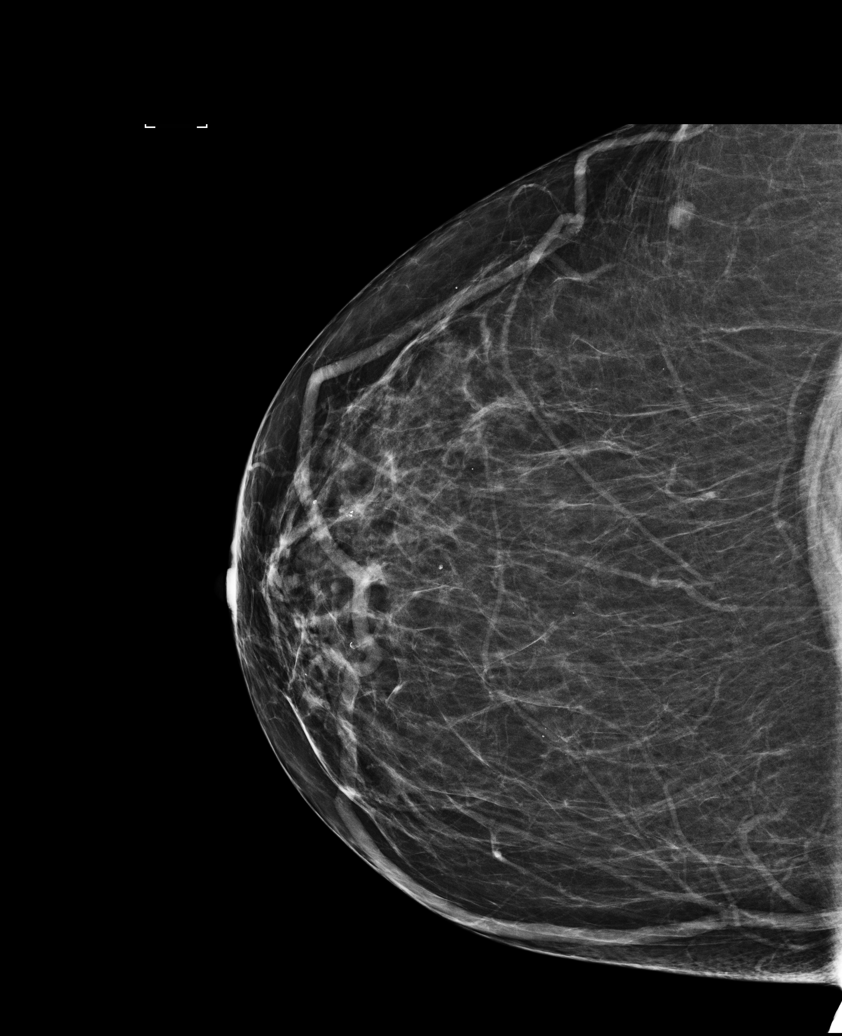

[L CC (2 of 2)]
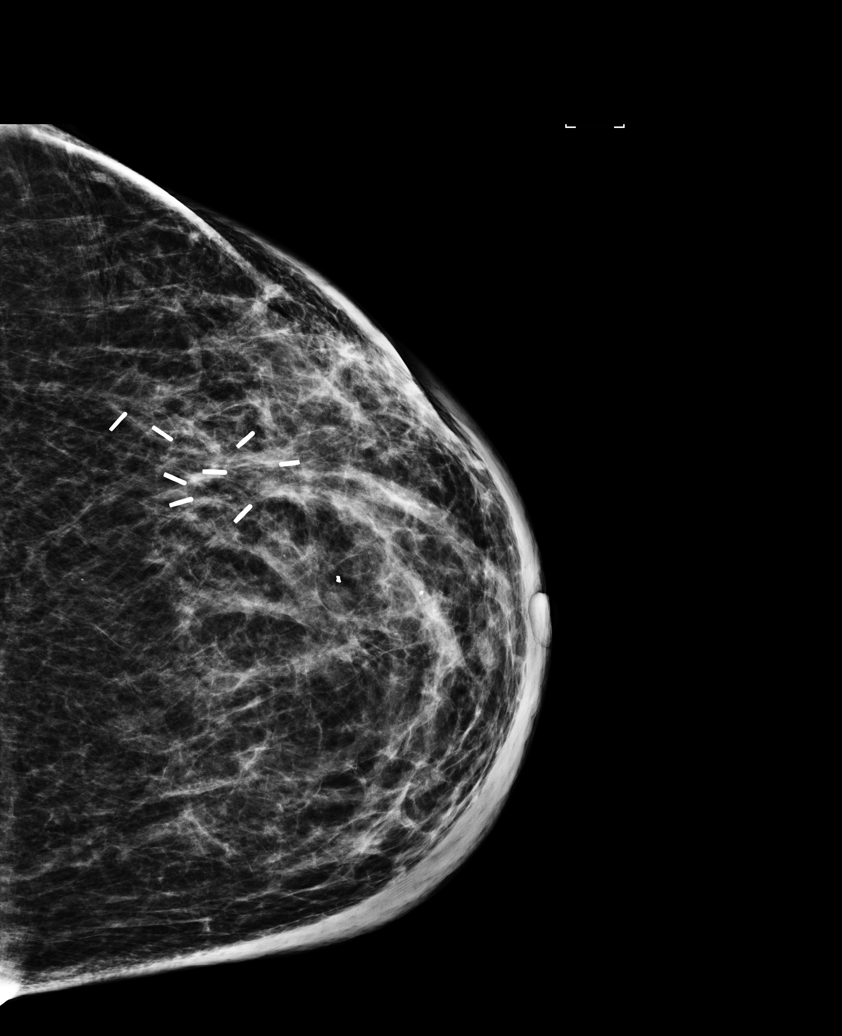

[L MLO]
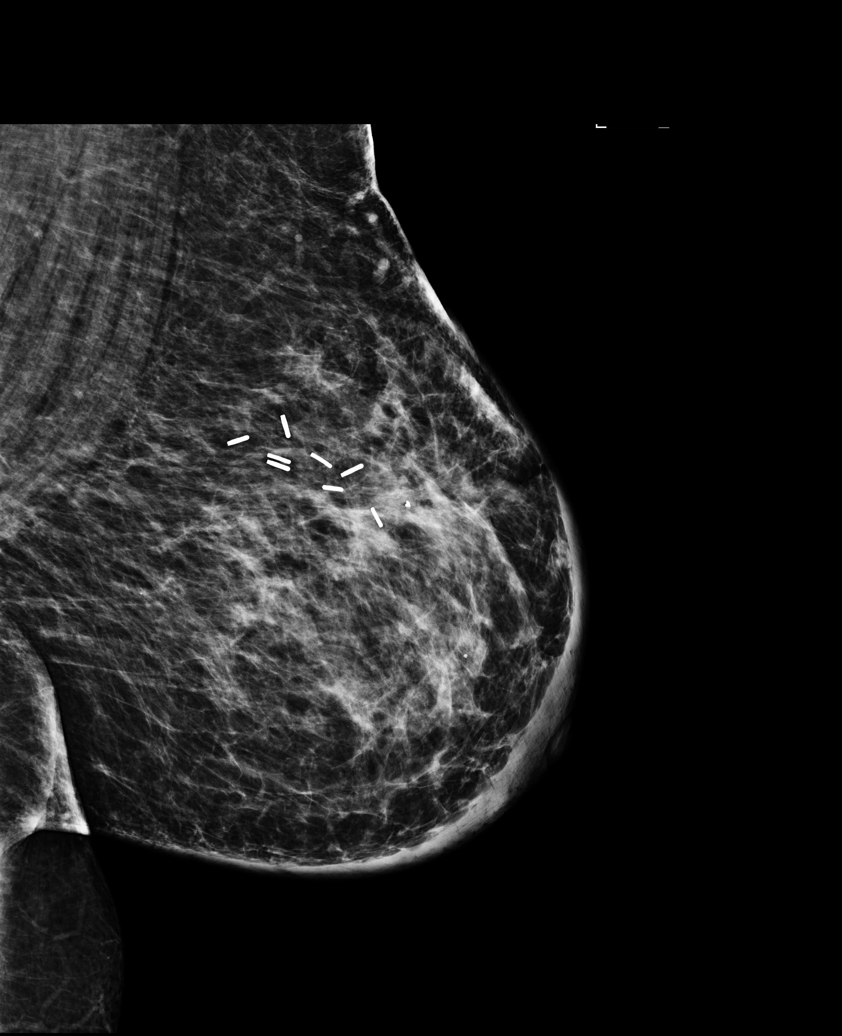

[R MLO]
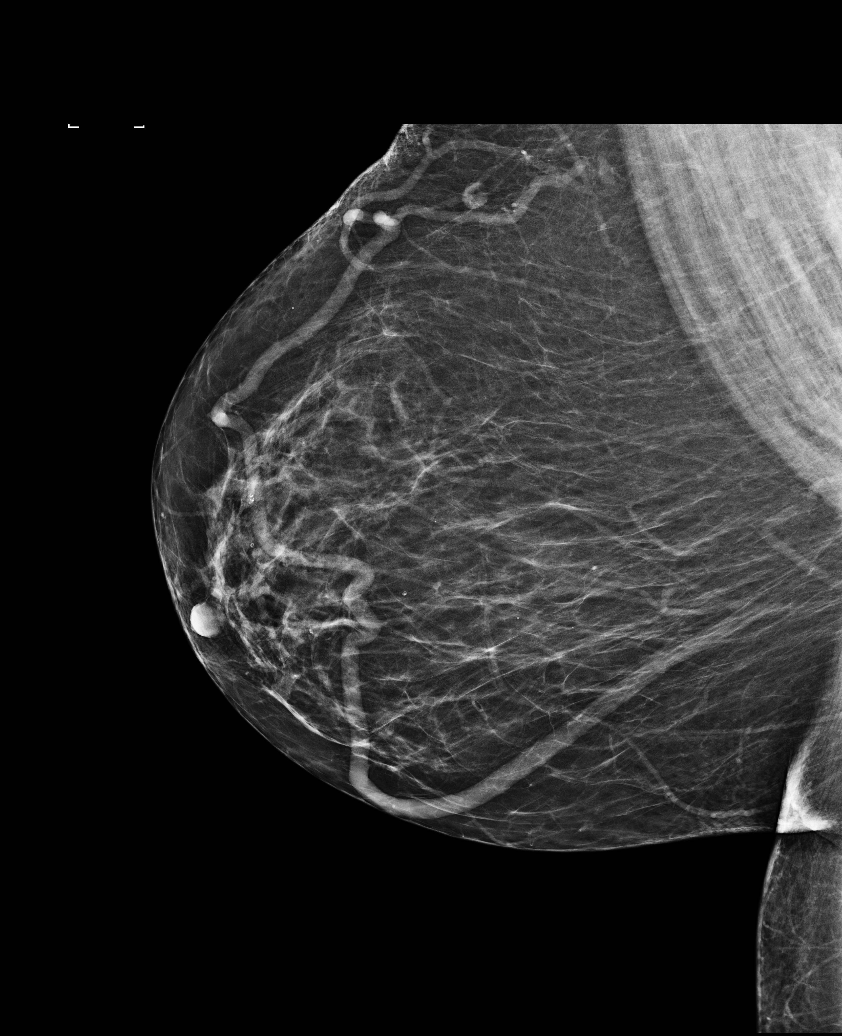

[L CC tomo · tomo slice 37/72.0]
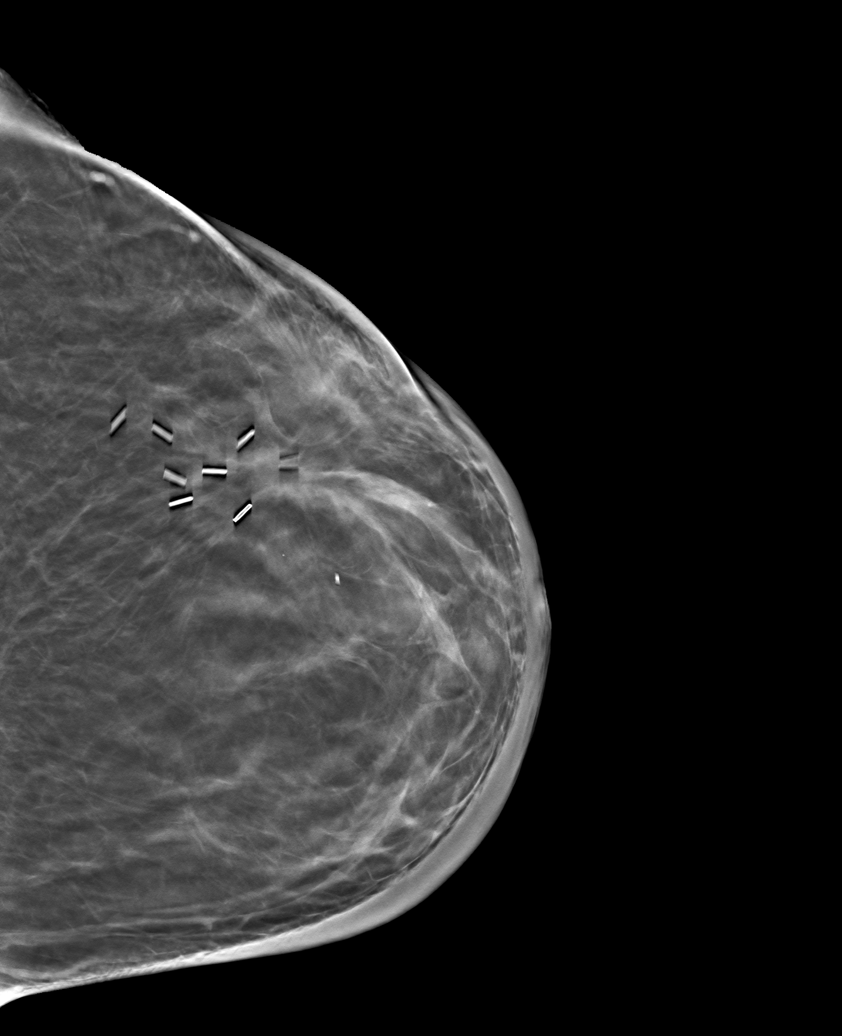

[6 of 25 positions shown; findings below may reference images not displayed]

ACR Breast Density Category b: There are scattered areas of
fibroglandular density.
FINDINGS: No suspicious masses or calcifications are seen in the right breast.
Postsurgical changes are present in the upper slightly outer left
breast related to interval lumpectomy. A spot compression
magnification CC view of the lumpectomy site in the left breast was
performed. There is no mammographic evidence of recurrence at the
lumpectomy site. There is an oval mixed density mass within the
upper left breast anterior depth near the skin surface.

Mammographic images were processed with CAD.

Physical examination of the left breast reveals skin discoloration
and thickening/changes related to prior radiation therapy. A
surgical scar is present in the upper-outer left breast. The patient
reports the left breast to be diffusely tender.

Targeted ultrasound of the left breast was performed demonstrating
postsurgical changes with scar and fluid as well as subcutaneous
edema at 1-2 o'clock 5 cm from the nipple measuring up to 3.7 x
x 4.1 cm. This is directly subjacent to the surgical site and
corresponds well with the finding seen on mammography.
IMPRESSION: No mammographic evidence of malignancy in either breast, with
postsurgical/ post treatment changes seen in the left breast
sonographically.

RECOMMENDATION:
Bilateral diagnostic mammography in 12 months.

I have discussed the findings and recommendations with the patient.
Results were also provided in writing at the conclusion of the
visit. If applicable, a reminder letter will be sent to the patient
regarding the next appointment.

BI-RADS CATEGORY  2: Benign.

## 2015-09-07 ENCOUNTER — Ambulatory Visit (HOSPITAL_COMMUNITY)
Admission: RE | Admit: 2015-09-07 | Discharge: 2015-09-07 | Disposition: A | Discharge status: Home / Self Care | Payer: Medicaid Other | Source: Ambulatory Visit | Attending: Hematology & Oncology | Admitting: Hematology & Oncology

## 2015-09-07 ENCOUNTER — Encounter (HOSPITAL_COMMUNITY): Payer: Self-pay | Admitting: Hematology & Oncology

## 2015-09-07 ENCOUNTER — Encounter (HOSPITAL_COMMUNITY): Payer: Medicaid Other | Attending: Oncology | Admitting: Hematology & Oncology

## 2015-09-07 VITALS — BP 143/77 | HR 73 | Temp 97.9°F | Resp 18 | Wt 245.0 lb

## 2015-09-07 DIAGNOSIS — Z79811 Long term (current) use of aromatase inhibitors: Secondary | ICD-10-CM

## 2015-09-07 DIAGNOSIS — K219 Gastro-esophageal reflux disease without esophagitis: Secondary | ICD-10-CM

## 2015-09-07 DIAGNOSIS — Z79899 Other long term (current) drug therapy: Secondary | ICD-10-CM

## 2015-09-07 DIAGNOSIS — M8580 Other specified disorders of bone density and structure, unspecified site: Secondary | ICD-10-CM | POA: Diagnosis not present

## 2015-09-07 DIAGNOSIS — C50412 Malignant neoplasm of upper-outer quadrant of left female breast: Secondary | ICD-10-CM

## 2015-09-07 DIAGNOSIS — G4700 Insomnia, unspecified: Secondary | ICD-10-CM | POA: Diagnosis not present

## 2015-09-07 DIAGNOSIS — M25551 Pain in right hip: Secondary | ICD-10-CM | POA: Diagnosis not present

## 2015-09-07 DIAGNOSIS — Z720 Tobacco use: Secondary | ICD-10-CM

## 2015-09-07 DIAGNOSIS — R319 Hematuria, unspecified: Secondary | ICD-10-CM | POA: Insufficient documentation

## 2015-09-07 DIAGNOSIS — F419 Anxiety disorder, unspecified: Secondary | ICD-10-CM | POA: Diagnosis not present

## 2015-09-07 DIAGNOSIS — E876 Hypokalemia: Secondary | ICD-10-CM | POA: Insufficient documentation

## 2015-09-07 IMAGING — DX DG HIP (WITH OR WITHOUT PELVIS) 2-3V*R*
3 series · 3 of 3 positions shown · non-contrast
Comparison: None.

CLINICAL DATA: Right hip pain radiating down to right knee off and
on for 4 years. Fell down steps 4 years ago.

EXAM:
DG HIP (WITH OR WITHOUT PELVIS) 2-3V RIGHT

[pelvis ap]
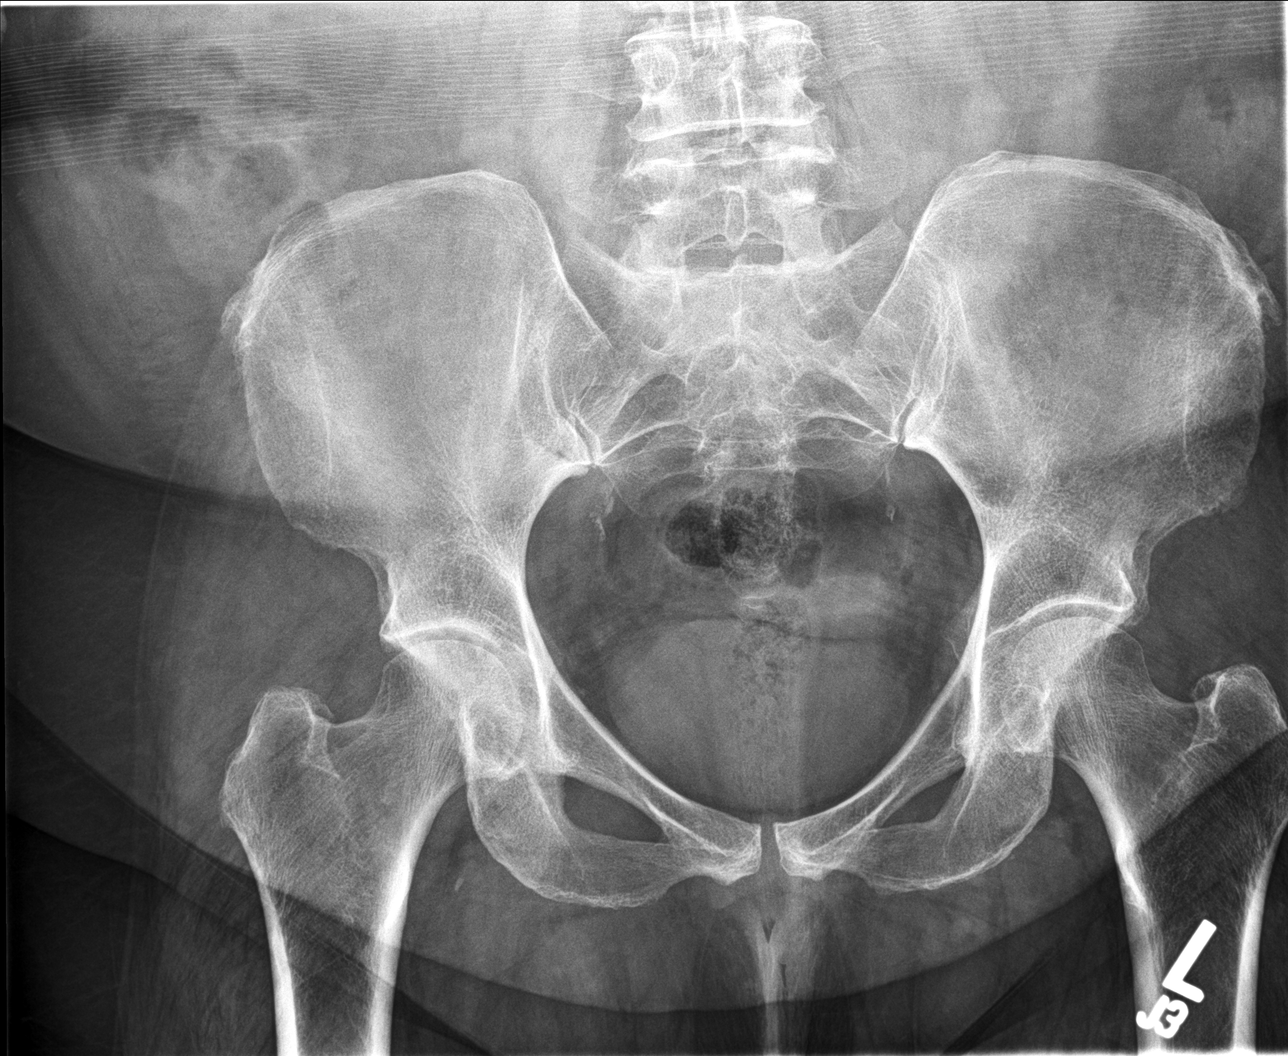

[hip ap]
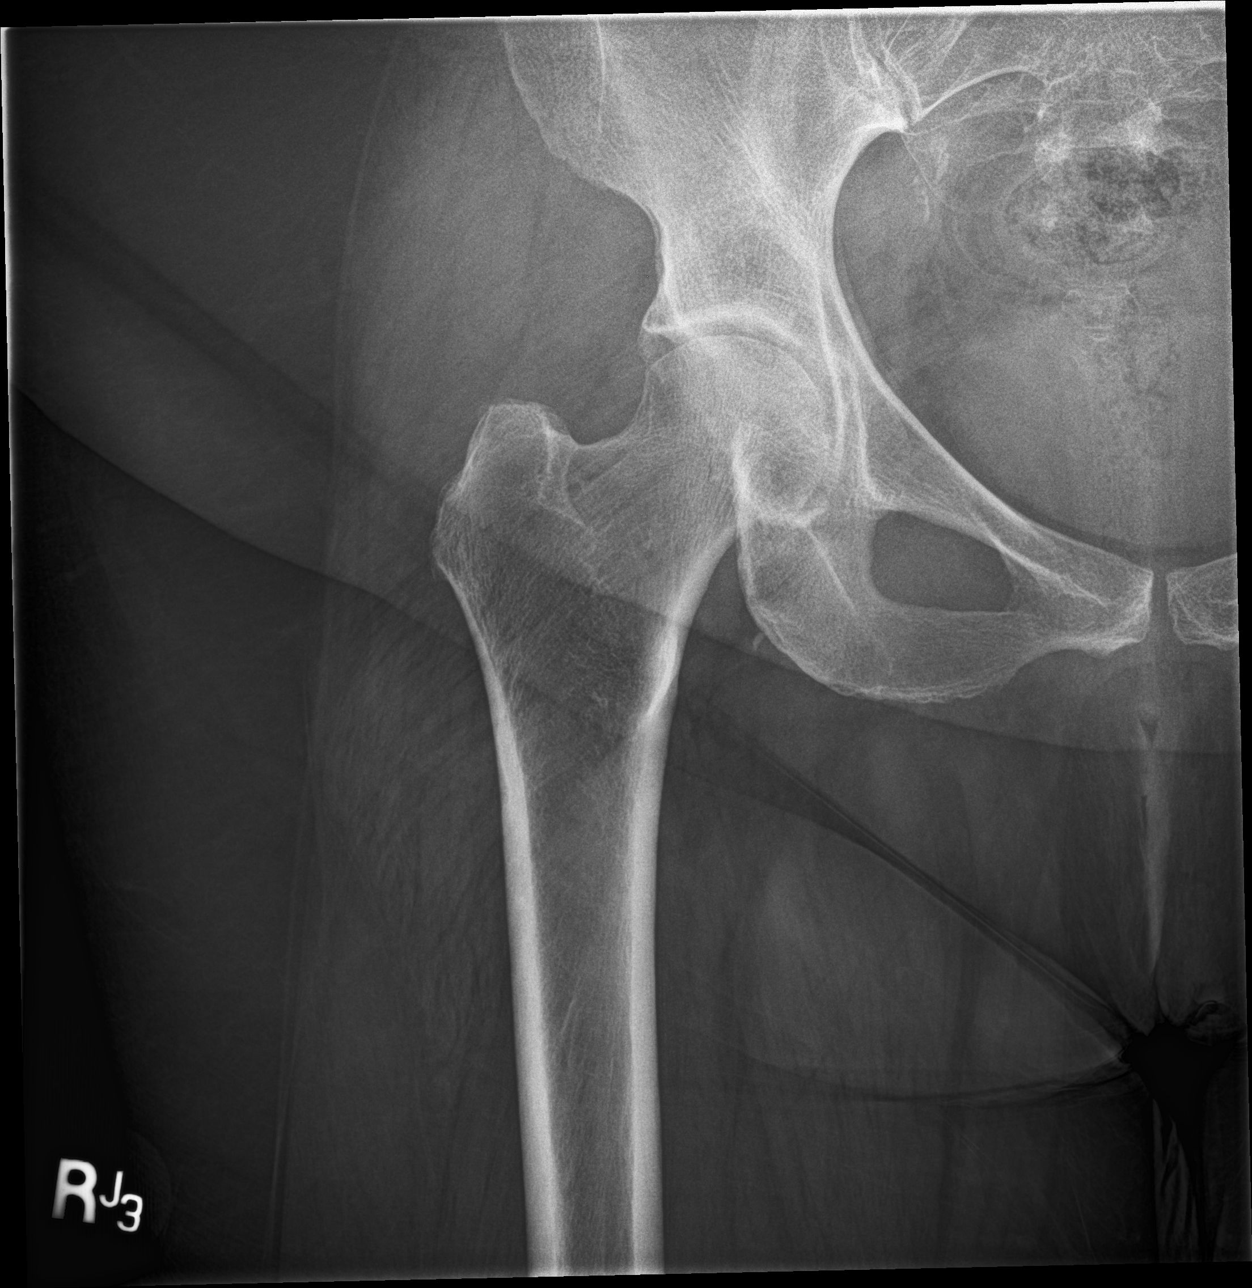

[hip lat]
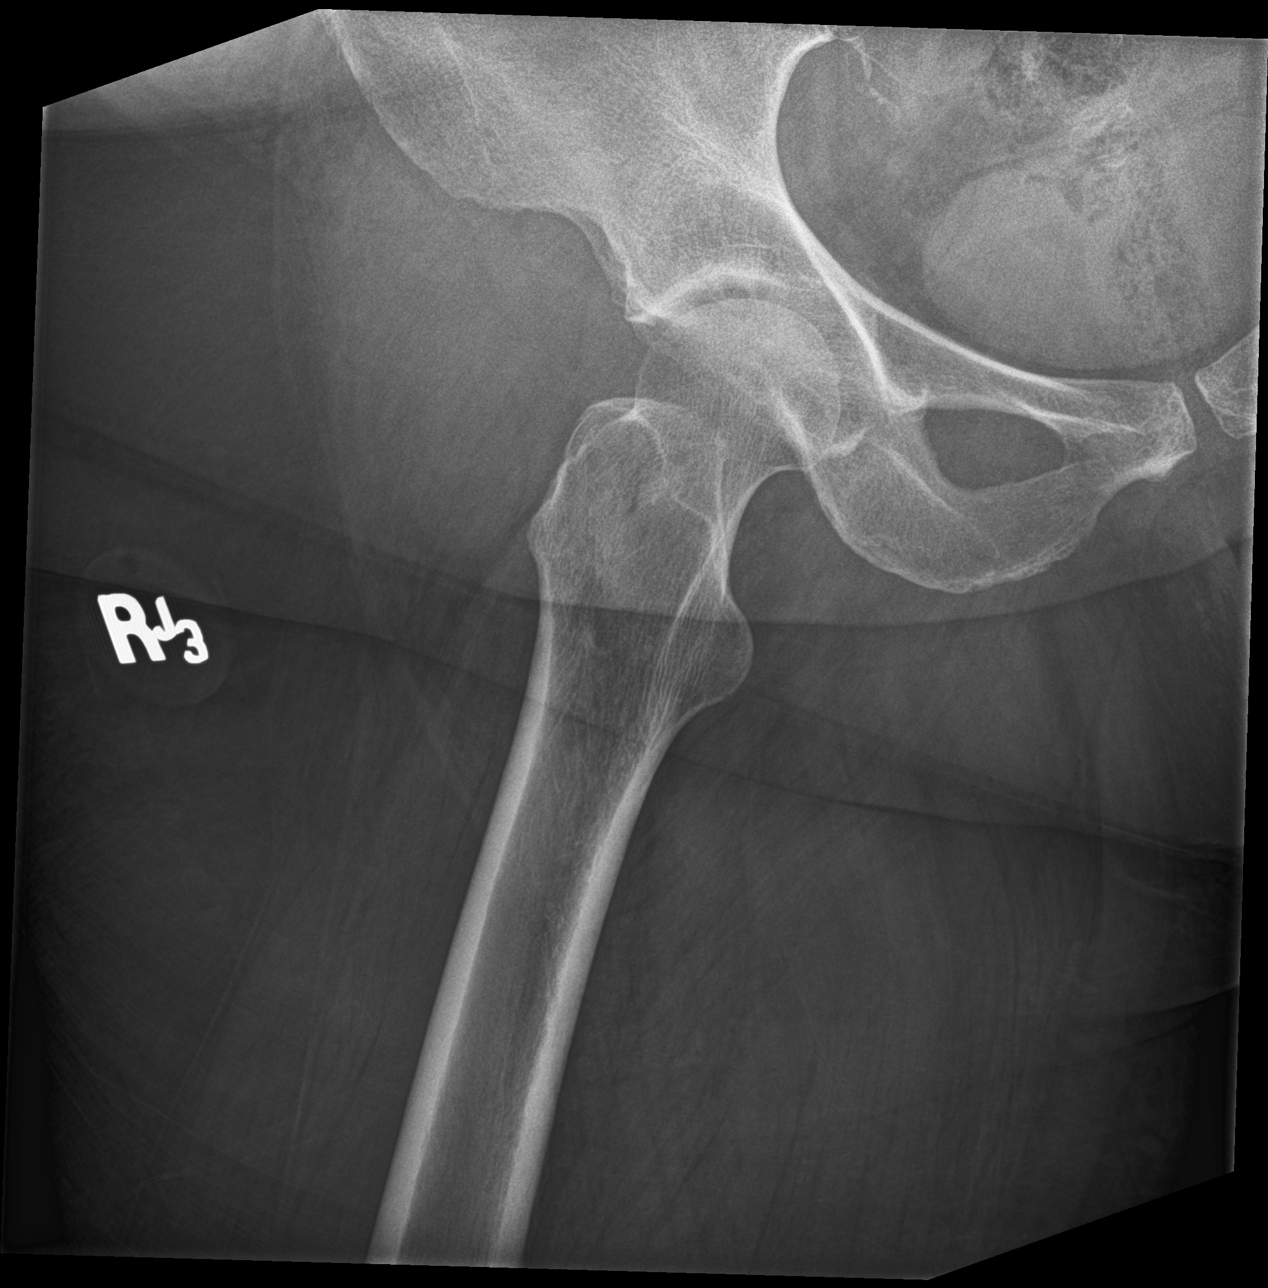

[3 of 3 positions shown; findings below may reference images not displayed]

FINDINGS: Early symmetric degenerative changes in the hips bilaterally with
slight joint space narrowing and early spurring. No acute bony
abnormality. Specifically, no fracture, subluxation, or dislocation.
Soft tissues are intact.
IMPRESSION: No acute bony abnormality.

## 2015-09-07 MED ORDER — OMEPRAZOLE 40 MG PO CPDR: 40.0000 mg | DELAYED_RELEASE_CAPSULE | Freq: Every day | ORAL | Status: DC

## 2015-09-07 NOTE — Patient Instructions (Signed)
..  [place] at [facility] Discharge Instructions  RECOMMENDATIONS MADE BY THE CONSULTANT AND ANY TEST RESULTS WILL BE SENT TO YOUR REFERRING PHYSICIAN.  You need referral for an EGD to follow up on your stomach, reflux, and need for TUMS You also are overdue for your colonoscopy  Prilosec refills  Discussed Prolia to help with your bone strength Main risk is infection in the jaw bone-do not have invasive dental work while on this shot  You can take calcium by ways of your tums Also take over the counter vitamin D [phone] units daily  Hip x-rays today to investigate this pain you are having  We will enter you in the survivorship program     Thank you for choosing [place] at [facility] to provide your oncology and hematology care.  To afford each patient quality time with our provider, please arrive at least 15 minutes before your scheduled appointment time.   Beginning January 23rd 2017 lab work for the [facility] will be done in the  Main lab at [facility] on 1st floor. If you have a lab appointment with the [facility] please come in thru the  Main Entrance and check in at the main information desk  You need to re-schedule your appointment should you arrive 10 or more minutes late.  We strive to give you quality time with our providers, and arriving late affects you and other patients whose appointments are after yours.  Also, if you no show three or more times for appointments you may be dismissed from the clinic at the providers discretion.     Again, thank you for choosing [facility].  Our hope is that these requests will decrease the amount of time that you wait before being seen by our physicians.       _____________________________________________________________  Should you have questions after your visit to [facility], please contact our office at [phone] between the hours of 8:30  a.m. and 4:30 p.m.  Voicemails left after 4:30 p.m. will not be returned until the following business day.  For prescription refill requests, have your pharmacy contact our office.

## 2015-09-07 NOTE — Progress Notes (Signed)
Regina Mcmahon  Patient Care Team: Sinda Du, MD as PCP - General (Internal Medicine) Patrici Ranks, MD as Consulting Physician (Hematology and Oncology) Erroll Luna, MD as Consulting Physician (General Surgery) Thea Silversmith, MD as Consulting Physician (Radiation Oncology) Sylvan Cheese, NP as Nurse Practitioner (Hematology and Oncology)  CHIEF COMPLAINTS/PURPOSE OF CONSULTATION:  L breast cancer History of L breast core biopsy for microcalcifications in 2014, fibroadenoma. CMF cycle 1 on 10/02/2014 No matching staging information was found for the patient.  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    06/24/2014 Breast US Left breast: ill-defined predominantly hypoechoic area with some increased echogenicity in the 2 o'clock position of the left breast, 7 cm from the nipple. 2.6 x 2.3 x 1.8 cm in maximum dimensions.   07/01/2014 Initial Biopsy Left breast core needle bx: Invasive mammary carcinoma with lobular features, ER+ (100%), PR+ (91%), HER2/neu negative (ratio 1.09), Ki-67 32%. E-cadherin strongly positive, diagnostic for IDC    07/01/2014 Clinical Stage Stage IIA: T2 N0   08/20/2014 Definitive Surgery Left breast seed localized lumpectomy/SLNB: IDC, +LVI, DCIS, 1 LN removed and positive for metastatic carcinoma. Grade 2. HER2/neu repeated and negative (ratio 0.69-1.9).    08/20/2014 Pathologic Stage Stage IIB: pT2 pN1a pMx   09/17/2014 Surgery Port-a cath placement and re-excision   10/02/2014 - 01/15/2015 Chemotherapy CMF x 6  cycles.  patient refused Taxane-containing chemotherapy.   03/07/2015 Procedure Comp Cancer Panel reveals no variant at APC, ATM, AXIN2, BARD1, BMPR1A, BRCA1, BRCA2, BRIP1, CDH1, CDK4, CDKN2A, CHEK2, FANCC, MLH1, MSH2, MSH6, MUTYH, NBN, PALB2, PMS2, POLD1, POLE, PTEN, RAD51C, RAD51D, SMAD4, STK11, TP53, VHL, and XRCC2.   04/06/2015 - 05/21/2015 Radiation Therapy Adjuvant RT Pablo Ledger): Left breast/ 45 Gy over 25 fractions.  Left supraclavicular fossa and axilla/ 45 Gy over 25 fractions. Left breast boost/ 16 Gy over 8 fractions. Total dose: 61 Gy   06/04/2015 -  Anti-estrogen oral therapy Exemestane 25 mg daily.  Planned duration of therapy 5 years.    07/23/2015 Survivorship Survivorship care plan completed and mailed to patient in lieu of in person visit    HISTORY OF PRESENTING ILLNESS:  Regina Mcmahon 65 y.o. 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 She has completed chemotherapy without any significant complication. She has completed XRT.  Regina Mcmahon is accompanied by her sister today. I have personally reviewed and discussed imaging studies (DEXA) with the patient.  She has never had an EGD. She continues to experience heartburn and takes Tums regularly. When she takes Prilosec, she still needs to take Tums to alleviate heartburn.   She is not happy about having a colonoscopy performed but is agreeable to having a colonoscopy as well. She is overdue for one. At her last C-scope she had multiple polyps removed. She has a family history of colon cancer.   She is not currently taking a Vitamin D supplement.   She continues to complain of lower back pain and  groin pain, "where the leg meets the hip". This pain began 4 years ago when she fells on her steps and hit her back. It has however significantly worsened and this concerns her.   Reports weight gain. She continues to smoke.   MEDICAL HISTORY:  Past Medical History    Diagnosis Date  . Hypertension   . Panic attacks   . Chronic airway obstruction ([place])   . Pain     Hx.pain in joint involving pelvic region and thigh; pain in limb  . Sinusitis   . Asthma   . Palpitations   . Hemorrhage rectum     and anus.  . Symptomatic menopausal or female climacteric states   . Asymptomatic varicose veins   . Other symptoms involving digestive system(787.99)   . Depression   . Breast cancer ([place]) 06/2014    left  . Insomnia     SURGICAL HISTORY: Past Surgical History  Procedure Laterality Date  . Cesarean section    . Cholecystectomy  1985  . Tubal ligation    . Colonoscopy    . Radioactive seed guided mastectomy with axillary sentinel lymph node biopsy Left 08/20/2014    Procedure: LEFT BREAST LUMPECTOMY WITH RADIOACTIVE SEED LOCALIZATION AND SENTINEL LYMPH NODE MAPPING;  Surgeon: Erroll Luna, MD;  Location: [facility];  Service: General;  Laterality: Left;  . Portacath placement Right 09/17/2014    Procedure: INSERTION PORT-A-CATH WITH ULTRASOUND;  Surgeon: Erroll Luna, MD;  Location: [facility];  Service: General;  Laterality: Right;  IJ  . Re-excision of breast lumpectomy Left 09/17/2014    Procedure: RE-EXCISION OF BREAST LUMPECTOMY;  Surgeon: Erroll Luna, MD;  Location: [facility];  Service: General;  Laterality: Left;  . Port-a-cath removal  02/2015    SOCIAL HISTORY: Social History   Social History  . Marital Status: Divorced    Spouse Name: N/A  . Number of Children: 2  . Years of Education: N/A   Occupational History  . Not on file.   Social History Main Topics  . Smoking status: Current Every Day Smoker -- 2.00 packs/day    Types: Cigarettes  . Smokeless tobacco: Never Used  . Alcohol Use: No  . Drug Use: No  . Sexual Activity: No   Other Topics Concern  . Not on file   Social History Narrative  Smokes 2 PPD   FAMILY HISTORY: Family History  Problem Relation Age of Onset   . Diabetes Mother   . Ovarian cancer Mother 75  . Other Father     died in MVA at age 40  . Other Sister     mitral valve disorder  . Melanoma Sister 54    removed from back of leg  . Colon cancer Brother   . Cancer Paternal Aunt     leukemia, bone, or lung cancer  . Alzheimer's disease Maternal Grandmother   . Heart attack Maternal Grandfather   . Heart attack Paternal Grandmother   . COPD Paternal Grandfather   . Emphysema Paternal Grandfather   . Congestive Heart Failure Paternal Aunt   . Stomach cancer Paternal Uncle   . Kidney cancer Maternal Uncle   . Cancer Cousin     dx. teens/dx. 40s  . Cancer Cousin   . Colon cancer Cousin    indicated that her mother is deceased. She indicated that her father is deceased. She indicated that her sister is alive. She indicated that her brother is deceased. She indicated  that her maternal grandmother is deceased. She indicated that her maternal grandfather is deceased. She indicated that her paternal grandmother is deceased. She indicated that her paternal grandfather is deceased. She indicated that her daughter is alive. She indicated that her son is alive. She indicated that only one of her two maternal aunts is alive. She indicated that only one of her three maternal uncles is alive. She indicated that only one of her three paternal aunts is alive. She indicated that both of her paternal uncles are deceased. She indicated that both of her cousins are deceased.   ALLERGIES:  has No Known Allergies.  MEDICATIONS:  Current Outpatient Prescriptions  Medication Sig Dispense Refill  . acetaminophen (TYLENOL) 325 MG tablet Take 650 mg by mouth every 6 (six) hours as needed.    Marland Kitchen albuterol (PROVENTIL HFA;VENTOLIN HFA) 108 (90 BASE) MCG/ACT inhaler Inhale 2 puffs into the lungs every 6 (six) hours as needed for wheezing or shortness of breath.    . ALPRAZolam (XANAX) 0.5 MG tablet Take 0.5 mg by mouth 3 (three) times daily as needed for anxiety.     Marland Kitchen anastrozole (ARIMIDEX) 1 MG tablet Take 1 tablet (1 mg total) by mouth daily. 30 tablet 3  . calcium carbonate (TUMS - DOSED IN MG ELEMENTAL CALCIUM) 500 MG chewable tablet Chew 1 tablet by mouth 3 (three) times daily with meals.    . diphenhydramine-acetaminophen (TYLENOL PM) 25-500 MG TABS Take 2 tablets by mouth at bedtime.    . docusate sodium (COLACE) 100 MG capsule Take 100 mg by mouth 3 (three) times daily as needed for mild constipation.    . hydrochlorothiazide (MICROZIDE) 12.5 MG capsule Take 12.5 mg by mouth daily.    Marland Kitchen ibuprofen (ADVIL,MOTRIN) 200 MG tablet Take 200 mg by mouth every 6 (six) hours as needed.    . non-metallic deodorant Jethro Poling) MISC Apply 1 application topically daily as needed.    Marland Kitchen tiZANidine (ZANAFLEX) 4 MG tablet 4 mg as needed.     . ergocalciferol (VITAMIN D2) 50000 UNITS capsule Take 1 capsule (50,000 Units total) by mouth once a week. (Patient not taking: Reported on 09/07/2015) 4 capsule 3  . exemestane (AROMASIN) 25 MG tablet Take 1 tablet (25 mg total) by mouth daily after breakfast. (Patient not taking: Reported on 06/09/2015) 30 tablet 3  . Na Sulfate-K Sulfate-Mg Sulf (SUPREP BOWEL PREP) SOLN Take 1 kit by mouth as directed. (Patient not taking: Reported on 09/07/2015) 1 Bottle 0  . omeprazole (PRILOSEC) 40 MG capsule Take 1 capsule (40 mg total) by mouth daily. 30 capsule 3  . potassium chloride SA (K-DUR,KLOR-CON) 20 MEQ tablet Take 1 tablet (20 mEq total) by mouth 2 (two) times daily. (Patient not taking: Reported on 09/07/2015) 60 tablet 1  . temazepam (RESTORIL) 15 MG capsule Take 1-2 capsules (15-30 mg total) by mouth at bedtime as needed for sleep. (Patient not taking: Reported on 09/07/2015) 60 capsule 5   No current facility-administered medications for this visit.    Review of Systems  Constitutional: Positive for weight gain. Negative for fever, chills, weight loss and malaise/fatigue.  HENT: Negative for congestion, hearing loss, nosebleeds,  sore throat and tinnitus.   Eyes: Negative for blurred vision, double vision, pain and discharge.  Respiratory: Negative for cough, hemoptysis, sputum production, shortness of breath and wheezing.  Cardiovascular: Negative for chest pain, palpitations, claudication, leg swelling and PND.  Gastrointestinal: Positive for heartburn. Negative for nausea, vomiting, abdominal pain, diarrhea, constipation, blood in stool and  melena.  Genitourinary: Negative for dysuria, urgency, frequency and hematuria.  Musculoskeletal: Positive for hip and back pain. Negative for myalgias, joint pain and falls.  Skin: Negative for itching and rash.  Neurological: Negative for dizziness, tingling, tremors, sensory change, speech change, focal weakness, seizures, loss of consciousness, weakness and headaches.  Endo/Heme/Allergies: Does not bruise/bleed easily.  Psychiatric/Behavioral: Negative for depression, suicidal ideas, memory loss and substance abuse. The patient is not nervous/anxious and does not have insomnia.   14 point review of systems was performed and is negative except as detailed under history of present illness and above   PHYSICAL EXAMINATION: ECOG PERFORMANCE STATUS: 0 - Asymptomatic  Filed Vitals:   09/07/15 1312  BP: 143/77  Pulse: 73  Temp: 97.9 F (36.6 C)  Resp: 18   Filed Weights   09/07/15 1312  Weight: 245 lb (111.131 kg)   Physical Exam  Constitutional: She is oriented to person, place, and time and well-developed, well-nourished, and in no distress.   HENT:  Head: Normocephalic and atraumatic.  Nose: Nose normal.  Mouth/Throat: Oropharynx is clear and moist. No oropharyngeal exudate.  Eyes: Conjunctivae and EOM are normal. Pupils are equal, round, and reactive to light. Right eye exhibits no discharge. Left eye exhibits no discharge. No scleral icterus.  Neck: Normal range of motion. Neck supple. No tracheal deviation present. No thyromegaly present.  Cardiovascular: Normal  rate, regular rhythm and normal heart sounds.  Exam reveals no gallop and no friction rub.   No murmur heard. Pulmonary/Chest: Effort normal and breath sounds normal. She has no wheezes. She has no rales.  Abdominal: Soft. Bowel sounds are normal. She exhibits no distension and no mass. There is no tenderness. There is no rebound and no guarding.  Musculoskeletal: Normal range of motion. She exhibits no edema.  Lymphadenopathy:    She has no cervical adenopathy.  Neurological: She is alert and oriented to person, place, and time. She has normal reflexes. No cranial nerve deficit. Gait normal. Coordination normal.  Skin: Skin is warm and dry. No rash noted.  Psychiatric: Mood, memory, affect and judgment normal.  Nursing Mcmahon and vitals reviewed.   LABORATORY DATA:  I have reviewed the data as listed Lab Results  Component Value Date   WBC 5.0 06/04/2015   HGB 16.2* 06/04/2015   HCT 45.5 06/04/2015   MCV 90.8 06/04/2015   PLT 157 06/04/2015     Chemistry      Component Value Date/Time   NA 139 06/04/2015 1059   K 3.5 06/04/2015 1059   CL 105 06/04/2015 1059   CO2 27 06/04/2015 1059   BUN 20 06/04/2015 1059   CREATININE 0.65 06/04/2015 1059      Component Value Date/Time   CALCIUM 9.3 06/04/2015 1059   ALKPHOS 61 06/04/2015 1059   AST 14* 06/04/2015 1059   ALT 14 06/04/2015 1059   BILITOT 1.3* 06/04/2015 1059     RADIOLOGY: I have reviewed the above documentation for accuracy and completeness, and I agree with the above. Study Result     CLINICAL DATA: 65 year old female with left breast cancer post lumpectomy 08/20/2014.  EXAM: DIGITAL DIAGNOSTIC LEFT MAMMOGRAM WITH 3D TOMOSYNTHESIS AND CAD  LEFT BREAST ULTRASOUND  COMPARISON: Previous exam(s).  ACR Breast Density Category b: There are scattered areas of fibroglandular density.  FINDINGS: No suspicious masses or calcifications are seen in the right breast. Postsurgical changes are present in the  upper slightly outer left breast related to interval lumpectomy. A spot compression magnification  CC view of the lumpectomy site in the left breast was performed. There is no mammographic evidence of recurrence at the lumpectomy site. There is an oval mixed density mass within the upper left breast anterior depth near the skin surface.  Mammographic images were processed with CAD.  Physical examination of the left breast reveals skin discoloration and thickening/changes related to prior radiation therapy. A surgical scar is present in the upper-outer left breast. The patient reports the left breast to be diffusely tender.  Targeted ultrasound of the left breast was performed demonstrating postsurgical changes with scar and fluid as well as subcutaneous edema at 1-2 o'clock 5 cm from the nipple measuring up to 3.7 x 1.1 x 4.1 cm. This is directly subjacent to the surgical site and corresponds well with the finding seen on mammography.  IMPRESSION: No mammographic evidence of malignancy in either breast, with postsurgical/ post treatment changes seen in the left breast sonographically.  RECOMMENDATION: Bilateral diagnostic mammography in 12 months.  I have discussed the findings and recommendations with the patient. Results were also provided in writing at the conclusion of the visit. If applicable, a reminder letter will be sent to the patient regarding the next appointment.  BI-RADS CATEGORY 2: Benign.   Electronically Signed  By: Everlean Alstrom M.D.  On: 08/25/2015 10:44   Study Result     EXAM: DUAL X-RAY ABSORPTIOMETRY (DXA) FOR BONE MINERAL DENSITY  IMPRESSION: Dear Dr. Patrici Ranks,  Your patient Regina Mcmahon completed a FRAX assessment on 07/01/2015 using the [place] (analysis version: 14.10) manufactured by [employer]. The following summarizes the results of our evaluation.  PATIENT BIOGRAPHICAL: Name: Regina Mcmahon, Regina Mcmahon Patient ID: [id number] Birth Date: 29-Jan-1951 Height: 64.0 in. Gender: Female [age] Weight: 240.0 lbs. Ethnicity: White Exam Date: 07/01/2015  FRAX* RESULTS: (version: 3.5) 10-year Probability of Fracture1 Major Osteoporotic Fracture2 Hip Fracture 15.6% 1.5% Population: [place] (Caucasian) Risk Factors: Family Hist. (Parent hip fracture), Tobacco User (Current Smoker)  Based on Femur (Left) Neck BMD  1 -The 10-year probability of fracture may be lower than reported if the patient has received treatment. 2 -Major Osteoporotic Fracture: Clinical Spine, Forearm, Hip or Shoulder  *FRAX is a Materials engineer of the [employer] for Metabolic Bone Disease, a [place] (WHO) [facility].  ASSESSMENT: The probability of a major osteoporotic fracture is 15.6% within the next ten years.  The probability of a hip fracture is 1.5% within the next ten years.  Ordering Physician: Dr. Patrici Ranks,  Your patient Regina Mcmahon completed a BMD test on 07/01/2015 using the [place] (software version: 14.10) manufactured by [employer]. The following summarizes the results of our evaluation. PATIENT BIOGRAPHICAL: Name: Regina Mcmahon, Regina Mcmahon Patient ID: [id number] Birth Date: [birth year] Height: 64.0 in. Gender: Female Exam Date: 07/01/2015 Weight: 240.0 lbs. Indications: Caucasian, Early Menopause, Hx Breast Ca, Low Calcium Intake, Parent Hip Fracture, Post Menopausal, Tobacco User, Family Hist. (Parent hip fracture), Tobacco User (Current Smoker) Fractures: Treatments: Aromasin DENSITOMETRY RESULTS: Site Region Measured Date Measured Age WHO Classification Young Adult T-score BMD %Change vs. Previous Significant Change (*) AP Spine L2-L4 07/01/2015 62.2 Osteopenia -1.8 0.980 g/cm2  DualFemur Neck Left  07/01/2015 62.2 Osteopenia -1.7 0.795 g/cm2 ASSESSMENT: BMD as determined from AP Spine L2-L4 is 0.980 g/cm2 with a T-Score of -1.8. This patient is considered osteopenic according to [facility]) criteria. (L-1 was excluded due to advanced degenerative changes.)  World Pharmacologist (WHO) criteria for post-menopausal, Caucasian Women: Normal: T-score at or above -1 SD Osteopenia: T-score between -1 and -2.5 SD Osteoporosis: T-score at or below -2.5 SD  RECOMMENDATIONS: [place] recommends that FDA-approved medial therapies be considered in postmenopausal women and men age 3 or older with a: 1. Hip or vertebral (clinical or morphometric) fracture. 2. T-Score of < -2.5 at the spine or hip. 3. Ten-year fracture probability by FRAX of 3% or greater for hip fracture or 20% or greater for major osteoporotic fracture.  All treatment decisions require clinical judgment and consideration of individual patient factors, including patient preferences, co-morbidities, previous drug use, risk factors not captured in the FRAX model (e.g. falls, vitamin D deficiency, increased bone turnover, interval significant decline in bone density) and possible under-or over-estimation of fracture risk by FRAX.  All patients should ensure an adequate intake of dietary calcium (1200 mg/d) and vitamin D (800 IU daily) unless contraindicated.  FOLLOW-UP: People with diagnosed cases of osteoporosis or osteopenia should be regularly tested for bone mineral density. For patients eligible for Medicare, routine testing is allowed once every 2 years. Testing frequency can be increased for patients who have rapidly progressing disease, or for those who are receiving medical therapy to restore bone mass.  I have reviewed this report, and agree with the above findings.  [facility]   Electronically Signed  By: David Martinique  M.D.  On: 07/01/2015 10:52    ASSESSMENT & PLAN:  Stage II, infiltrating ductal carcinoma of the left breast, ER positive, PR positive, HER-2 negative Anxiety Tobacco abuse Insomnia, difficulty falling asleep Constipation DEXA  07/01/2015 with osteopenia  She has had her mammogram. She is tolerating Arimidex better than aromasin and wishes to remain on therapy.  We discussed her DEXA today.  I personally reviewed and discussed bone density and mammogram results with the patient. We also discussed methods for maintaining bone health and the importance of maintaining dental health. Advised for the patient to begin taking a Vitamin D supplement. She will begin receiving Prolia.   I have refilled her prilosec. The patient will be scheduled for an EGD for severe reflux. She will also be scheduled for an overdue colonoscopy.  I will add Regina Mcmahon to our breast cancer survivorship program.  I have ordered X-rays of her hip for continued lower back pain and groin pain.   She continues to smoke and I have continued to discuss cessation with her.  She will return in 3 months.    This Mcmahon was signed electronically.    This document serves as a record of services personally performed by Ancil Linsey, MD. It was created on her behalf by Arlyce Harman, a trained medical scribe. The creation of this record is based on the scribe's personal observations and the provider's statements to them. This document has been checked and approved by the attending provider.  I have reviewed the above documentation for accuracy and completeness, and I agree with the above.  Kelby Fam. Estill Llerena MD

## 2015-09-14 ENCOUNTER — Encounter (HOSPITAL_COMMUNITY): Payer: Medicaid Other

## 2015-09-14 ENCOUNTER — Encounter (HOSPITAL_BASED_OUTPATIENT_CLINIC_OR_DEPARTMENT_OTHER): Payer: Medicaid Other

## 2015-09-14 ENCOUNTER — Encounter: Payer: Self-pay | Admitting: Gastroenterology

## 2015-09-14 ENCOUNTER — Encounter (HOSPITAL_COMMUNITY): Payer: Self-pay

## 2015-09-14 VITALS — BP 141/77 | HR 71 | Temp 98.2°F | Resp 18

## 2015-09-14 DIAGNOSIS — C50412 Malignant neoplasm of upper-outer quadrant of left female breast: Secondary | ICD-10-CM

## 2015-09-14 DIAGNOSIS — M8580 Other specified disorders of bone density and structure, unspecified site: Secondary | ICD-10-CM

## 2015-09-14 DIAGNOSIS — Z79899 Other long term (current) drug therapy: Secondary | ICD-10-CM

## 2015-09-14 DIAGNOSIS — E876 Hypokalemia: Secondary | ICD-10-CM | POA: Diagnosis not present

## 2015-09-14 DIAGNOSIS — R319 Hematuria, unspecified: Secondary | ICD-10-CM | POA: Diagnosis present

## 2015-09-14 LAB — COMPREHENSIVE METABOLIC PANEL
ALT: 14 U/L (ref 14–54)
AST: 15 U/L (ref 15–41)
Albumin: 4 g/dL (ref 3.5–5.0)
Alkaline Phosphatase: 75 U/L (ref 38–126)
Anion gap: 8 (ref 5–15)
BUN: 20 mg/dL (ref 6–20)
CO2: 32 mmol/L (ref 22–32)
Calcium: 9.5 mg/dL (ref 8.9–10.3)
Chloride: 101 mmol/L (ref 101–111)
Creatinine, Ser: 0.79 mg/dL (ref 0.44–1.00)
GFR calc Af Amer: >60 mL/min (ref >60)
GFR calc non Af Amer: >60 mL/min (ref >60)
Glucose, Bld: 110 mg/dL — ABNORMAL HIGH (ref 65–99)
Potassium: 4.5 mmol/L (ref 3.5–5.1)
Sodium: 141 mmol/L (ref 135–145)
Total Bilirubin: 1.2 mg/dL (ref 0.3–1.2)
Total Protein: 7.2 g/dL (ref 6.5–8.1)

## 2015-09-14 MED ORDER — DENOSUMAB 60 MG/ML ~~LOC~~ SOLN
60.0000 mg | Freq: Once | SUBCUTANEOUS | Status: AC
  Administered 2015-09-14: 60 mg via SUBCUTANEOUS
  Filled 2015-09-14: qty 1

## 2015-09-14 NOTE — Progress Notes (Signed)
..  Bronson Ing presents today for injection per the provider's orders.  prolia administration without incident; see MAR for injection details.  Patient tolerated procedure well and without incident.  No questions or complaints noted at this time. She continues to c/o hip pain. Varies in intensity. I let her know that Dr. Whitney Muse said the xray shows arthritis, no other abnormalities. She is encouraged to think about an orthopedic referral and will let us know is she decides to pursue this

## 2015-09-22 NOTE — Progress Notes (Signed)
This encounter was created in error - please disregard.

## 2015-09-30 ENCOUNTER — Ambulatory Visit: Payer: Medicaid Other | Admitting: Gastroenterology

## 2015-10-19 ENCOUNTER — Ambulatory Visit (INDEPENDENT_AMBULATORY_CARE_PROVIDER_SITE_OTHER): Payer: BLUE CROSS/BLUE SHIELD | Admitting: Gastroenterology

## 2015-10-19 ENCOUNTER — Encounter: Payer: Self-pay | Admitting: Gastroenterology

## 2015-10-19 ENCOUNTER — Other Ambulatory Visit: Payer: Self-pay

## 2015-10-19 VITALS — BP 139/82 | HR 74 | Temp 97.9°F | Ht 64.0 in | Wt 240.6 lb

## 2015-10-19 DIAGNOSIS — K219 Gastro-esophageal reflux disease without esophagitis: Secondary | ICD-10-CM

## 2015-10-19 DIAGNOSIS — Z800 Family history of malignant neoplasm of digestive organs: Secondary | ICD-10-CM | POA: Diagnosis not present

## 2015-10-19 MED ORDER — PEG 3350-KCL-NA BICARB-NACL 420 G PO SOLR: 4000.0000 mL | ORAL | Status: DC

## 2015-10-19 MED ORDER — PANTOPRAZOLE SODIUM 40 MG PO TBEC: 40.0000 mg | DELAYED_RELEASE_TABLET | Freq: Every day | ORAL | Status: DC

## 2015-10-19 NOTE — Assessment & Plan Note (Addendum)
65 year old overdue for high risk screening colonoscopy, with last colonoscopy in remote past ([place]). Records requested. Brother succumbed to the disease in his early 79s. Scant hematochezia noted historically in the setting of constipation. Otherwise, no other concerning lower GI symptoms.  Proceed with colonoscopy with Dr. Oneida Alar in the near future. The risks, benefits, and alternatives have been discussed in detail with the patient. They state understanding and desire to proceed.  12.5 mg Phenergan IV on call

## 2015-10-19 NOTE — Assessment & Plan Note (Signed)
Severe GERD symptoms despite PPI historically. Now off any PPI and only taking Tums. No dysphagia. Likely multifactorial and needing aggressive dietary and behavior modification. As she has had severe symptoms despite PPI, will proceed with EGD at time of colonoscopy.   Proceed with upper endoscopy in the near future with Dr. Oneida Alar. The risks, benefits, and alternatives have been discussed in detail with patient. They have stated understanding and desire to proceed.  Phenergan 12.5 mg IV on call Stop Tums. Start Protonix once daily

## 2015-10-19 NOTE — Progress Notes (Addendum)
Primary Care Physician:  Alonza Bogus, MD  Referring Physician: Dr. Whitney Muse  Primary Gastroenterologist:  Dr. Oneida Alar   Chief Complaint  Patient presents with  . Gastroesophageal Reflux    HPI:   Regina Mcmahon is a 65 y.o. female presenting today at the request of Dr. Whitney Muse for an upper endoscopy and colonoscopy. Her last colonoscopy was in [place] over 10 years ago. States she had polyps at that time. She has a positive family history of colon cancer in her brother, age 63, succumbed to the disease.   Notes significant reflux. Anything she eats causes severe indigestion. Taking a Tums after eating. Was taking Prilosec but stopped taking, as it was not helping. Has not tried Protonix. Intermittent constipation. Prior to finding out about breast cancer (2015) was seeing some blood if straining. Feels like her colon is very small. Any amount of size to her BMs "nearly kills me". Takes stool softeners now just as needed. Knows she doesn't drink enough water. Loves sun drop. No dysphagia. If significantly spicy foods will have abdominal discomfort. No weight loss or lack of appetite.   Past Medical History  Diagnosis Date  . Hypertension   . Panic attacks   . Chronic airway obstruction ([place])   . Pain     Hx.pain in joint involving pelvic region and thigh; pain in limb  . Sinusitis   . Asthma   . Palpitations   . Hemorrhage rectum     and anus.  . Symptomatic menopausal or female climacteric states   . Asymptomatic varicose veins   . Other symptoms involving digestive system(787.99)   . Depression   . Breast cancer ([place]) 06/2014    left  . Insomnia   . COPD (chronic obstructive pulmonary disease) [facility])     Past Surgical History  Procedure Laterality Date  . Cesarean section    . Cholecystectomy  1985  . Tubal ligation    . Colonoscopy      remote past in [place], over 10 years ago  . Radioactive seed guided mastectomy with axillary sentinel lymph node biopsy Left  08/20/2014    Procedure: LEFT BREAST LUMPECTOMY WITH RADIOACTIVE SEED LOCALIZATION AND SENTINEL LYMPH NODE MAPPING;  Surgeon: Erroll Luna, MD;  Location: [place];  Service: General;  Laterality: Left;  . Portacath placement Right 09/17/2014    Procedure: INSERTION PORT-A-CATH WITH ULTRASOUND;  Surgeon: Erroll Luna, MD;  Location: [place];  Service: General;  Laterality: Right;  IJ  . Re-excision of breast lumpectomy Left 09/17/2014    Procedure: RE-EXCISION OF BREAST LUMPECTOMY;  Surgeon: Erroll Luna, MD;  Location: [place];  Service: General;  Laterality: Left;  . Port-a-cath removal  02/2015    Current Outpatient Prescriptions  Medication Sig Dispense Refill  . acetaminophen (TYLENOL) 325 MG tablet Take 650 mg by mouth every 6 (six) hours as needed.    Marland Kitchen albuterol (PROVENTIL HFA;VENTOLIN HFA) 108 (90 BASE) MCG/ACT inhaler Inhale 2 puffs into the lungs every 6 (six) hours as needed for wheezing or shortness of breath.    . ALPRAZolam (XANAX) 0.5 MG tablet Take 0.5 mg by mouth 3 (three) times daily as needed for anxiety.    Marland Kitchen anastrozole (ARIMIDEX) 1 MG tablet Take 1 tablet (1 mg total) by mouth daily. 30 tablet 3  . calcium carbonate (TUMS - DOSED IN MG ELEMENTAL CALCIUM) 500 MG chewable tablet Chew 1 tablet by mouth 3 (three) times daily with meals.    Marland Kitchen  Calcium Citrate-Vitamin D (CALCIUM + D PO) Take by mouth daily.    . diphenhydramine-acetaminophen (TYLENOL PM) 25-500 MG TABS Take 2 tablets by mouth at bedtime.    . docusate sodium (COLACE) 100 MG capsule Take 100 mg by mouth 3 (three) times daily as needed for mild constipation.    . hydrochlorothiazide (MICROZIDE) 12.5 MG capsule Take 12.5 mg by mouth daily. Reported on 10/19/2015    . ibuprofen (ADVIL,MOTRIN) 200 MG tablet Take 200 mg by mouth every 6 (six) hours as needed.    Marland Kitchen tiZANidine (ZANAFLEX) 4 MG tablet 4 mg as needed.     . pantoprazole (PROTONIX) 40 MG tablet Take 1  tablet (40 mg total) by mouth daily. 30 tablet 3  . polyethylene glycol-electrolytes (TRILYTE) 420 g solution Take 4,000 mLs by mouth as directed. 4000 mL 0   No current facility-administered medications for this visit.    Allergies as of 10/19/2015  . (No Known Allergies)    Family History  Problem Relation Age of Onset  . Diabetes Mother   . Ovarian cancer Mother 75  . Other Father     died in MVA at age 80  . Other Sister     mitral valve disorder  . Melanoma Sister 54    removed from back of leg  . Colon cancer Brother     early 19s, succumbed to disease  . Cancer Paternal Aunt     leukemia, bone, or lung cancer  . Alzheimer's disease Maternal Grandmother   . Heart attack Maternal Grandfather   . Heart attack Paternal Grandmother   . COPD Paternal Grandfather   . Emphysema Paternal Grandfather   . Congestive Heart Failure Paternal Aunt   . Stomach cancer Paternal Uncle   . Kidney cancer Maternal Uncle   . Cancer Cousin     dx. teens/dx. 40s  . Cancer Cousin   . Colon cancer Cousin     Social History   Social History  . Marital Status: Divorced    Spouse Name: N/A  . Number of Children: 2  . Years of Education: N/A   Occupational History  . Not on file.   Social History Main Topics  . Smoking status: Current Every Day Smoker -- 1.50 packs/day    Types: Cigarettes  . Smokeless tobacco: Never Used  . Alcohol Use: No  . Drug Use: No  . Sexual Activity: No   Other Topics Concern  . Not on file   Social History Narrative    Review of Systems: Gen: Denies any fever, chills, fatigue, weight loss, lack of appetite.  CV: occasional palpitations  Resp: +SOB GI: see HPI  GU : Denies urinary burning, urinary frequency, urinary hesitancy MS: +joint pain  Derm: Denies rash, itching, dry skin Psych: Denies depression, anxiety, memory loss, and confusion Heme: see HPI   Physical Exam: BP 139/82 mmHg  Pulse 74  Temp(Src) 97.9 F (36.6 C)  Ht '5\' 4"'$   (1.626 m)  Wt 240 lb 9.6 oz (109.135 kg)  BMI 41.28 kg/m2 General:   Alert and oriented. Pleasant and cooperative. Well-nourished and well-developed.  Head:  Normocephalic and atraumatic. Eyes:  Without icterus, sclera clear and conjunctiva pink.  Ears:  Normal auditory acuity. Nose:  No deformity, discharge,  or lesions. Mouth:  No deformity or lesions, oral mucosa pink.  Lungs:  Clear to auscultation bilaterally. No wheezes, rales, or rhonchi. No distress.  Heart:  S1, S2 present without murmurs appreciated.  Abdomen:  +BS, soft,  non-tender and non-distended. No HSM noted. No guarding or rebound. No masses appreciated.  Rectal:  Deferred  Msk:  Symmetrical without gross deformities. Normal posture. Extremities:  Without  edema. Neurologic:  Alert and  oriented x4;  grossly normal neurologically. Psych:  Alert and cooperative. Normal mood and affect.  Lab Results  Component Value Date   WBC 5.0 06/04/2015   HGB 16.2* 06/04/2015   HCT 45.5 06/04/2015   MCV 90.8 06/04/2015   PLT 157 06/04/2015

## 2015-10-19 NOTE — Patient Instructions (Addendum)
We have scheduled you for a colonoscopy and upper endoscopy with Dr. Oneida Alar in the near future.  For reflux: start taking Protonix once each morning, 30 minutes before breakfast. Stop Tums for now. I sent this to your pharmacy.   Food Choices for Gastroesophageal Reflux Disease, Adult When you have gastroesophageal reflux disease (GERD), the foods you eat and your eating habits are very important. Choosing the right foods can help ease the discomfort of GERD. WHAT GENERAL GUIDELINES DO I NEED TO FOLLOW?  Choose fruits, vegetables, whole grains, low-fat dairy products, and low-fat meat, fish, and poultry.  Limit fats such as oils, salad dressings, butter, nuts, and avocado.  Keep a food diary to identify foods that cause symptoms.  Avoid foods that cause reflux. These may be different for different people.  Eat frequent small meals instead of three large meals each day.  Eat your meals slowly, in a relaxed setting.  Limit fried foods.  Cook foods using methods other than frying.  Avoid drinking alcohol.  Avoid drinking large amounts of liquids with your meals.  Avoid bending over or lying down until 2-3 hours after eating. WHAT FOODS ARE NOT RECOMMENDED? The following are some foods and drinks that may worsen your symptoms: Vegetables Tomatoes. Tomato juice. Tomato and spaghetti sauce. Chili peppers. Onion and garlic. Horseradish. Fruits Oranges, grapefruit, and lemon (fruit and juice). Meats High-fat meats, fish, and poultry. This includes hot dogs, ribs, ham, sausage, salami, and bacon. Dairy Whole milk and chocolate milk. Sour cream. Cream. Butter. Ice cream. Cream cheese.  Beverages Coffee and tea, with or without caffeine. Carbonated beverages or energy drinks. Condiments Hot sauce. Barbecue sauce.  Sweets/Desserts Chocolate and cocoa. Donuts. Peppermint and spearmint. Fats and Oils High-fat foods, including [place] fries and potato chips. Other Vinegar. Strong  spices, such as black pepper, white pepper, red pepper, cayenne, curry powder, cloves, ginger, and chili powder. The items listed above may not be a complete list of foods and beverages to avoid. Contact your dietitian for more information.   This information is not intended to replace advice given to you by your health care provider. Make sure you discuss any questions you have with your health care provider.   Document Released: 07/11/2005 Document Revised: 08/01/2014 Document Reviewed: 05/15/2013 Elsevier Interactive Patient Education [employer].

## 2015-10-20 NOTE — Progress Notes (Signed)
cc'ed to pcp °

## 2015-11-05 ENCOUNTER — Encounter: Payer: Self-pay | Admitting: Gastroenterology

## 2015-11-05 NOTE — Progress Notes (Signed)
Last colonoscopy by Dr. Lindalou Hose in 2002: internal hemorrhoids, one small rectal polyp, adenomatous path. Overdue for colonoscopy. Continue with plan for colonoscopy/EGD Nov 23, 2015.

## 2015-11-19 ENCOUNTER — Other Ambulatory Visit: Payer: Self-pay

## 2015-11-19 MED ORDER — PEG 3350-KCL-NA BICARB-NACL 420 G PO SOLR: 4000.0000 mL | ORAL | Status: DC

## 2015-11-23 ENCOUNTER — Encounter (HOSPITAL_COMMUNITY): Payer: Self-pay | Admitting: *Deleted

## 2015-11-23 ENCOUNTER — Encounter (HOSPITAL_COMMUNITY)
Admission: RE | Disposition: A | Discharge status: Home / Self Care | Payer: Self-pay | Source: Ambulatory Visit | Attending: Gastroenterology

## 2015-11-23 ENCOUNTER — Ambulatory Visit (HOSPITAL_COMMUNITY)
Admission: RE | Admit: 2015-11-23 | Discharge: 2015-11-23 | Disposition: A | Discharge status: Home / Self Care | Payer: BLUE CROSS/BLUE SHIELD | Source: Ambulatory Visit | Attending: Gastroenterology | Admitting: Gastroenterology

## 2015-11-23 DIAGNOSIS — K2950 Unspecified chronic gastritis without bleeding: Secondary | ICD-10-CM | POA: Insufficient documentation

## 2015-11-23 DIAGNOSIS — D124 Benign neoplasm of descending colon: Secondary | ICD-10-CM | POA: Diagnosis not present

## 2015-11-23 DIAGNOSIS — D120 Benign neoplasm of cecum: Secondary | ICD-10-CM | POA: Diagnosis not present

## 2015-11-23 DIAGNOSIS — Z1211 Encounter for screening for malignant neoplasm of colon: Secondary | ICD-10-CM | POA: Diagnosis not present

## 2015-11-23 DIAGNOSIS — Z853 Personal history of malignant neoplasm of breast: Secondary | ICD-10-CM | POA: Diagnosis not present

## 2015-11-23 DIAGNOSIS — Z79899 Other long term (current) drug therapy: Secondary | ICD-10-CM | POA: Diagnosis not present

## 2015-11-23 DIAGNOSIS — Z800 Family history of malignant neoplasm of digestive organs: Secondary | ICD-10-CM | POA: Insufficient documentation

## 2015-11-23 DIAGNOSIS — K2970 Gastritis, unspecified, without bleeding: Secondary | ICD-10-CM

## 2015-11-23 DIAGNOSIS — K621 Rectal polyp: Secondary | ICD-10-CM | POA: Insufficient documentation

## 2015-11-23 DIAGNOSIS — J449 Chronic obstructive pulmonary disease, unspecified: Secondary | ICD-10-CM | POA: Diagnosis not present

## 2015-11-23 DIAGNOSIS — D122 Benign neoplasm of ascending colon: Secondary | ICD-10-CM | POA: Diagnosis not present

## 2015-11-23 DIAGNOSIS — K219 Gastro-esophageal reflux disease without esophagitis: Secondary | ICD-10-CM

## 2015-11-23 DIAGNOSIS — I10 Essential (primary) hypertension: Secondary | ICD-10-CM | POA: Insufficient documentation

## 2015-11-23 DIAGNOSIS — K3189 Other diseases of stomach and duodenum: Secondary | ICD-10-CM | POA: Diagnosis not present

## 2015-11-23 DIAGNOSIS — F329 Major depressive disorder, single episode, unspecified: Secondary | ICD-10-CM | POA: Diagnosis not present

## 2015-11-23 DIAGNOSIS — Z86010 Personal history of colonic polyps: Secondary | ICD-10-CM | POA: Diagnosis not present

## 2015-11-23 HISTORY — PX: ESOPHAGOGASTRODUODENOSCOPY: SHX5428

## 2015-11-23 HISTORY — PX: COLONOSCOPY: SHX5424

## 2015-11-23 SURGERY — COLONOSCOPY
Anesthesia: Moderate Sedation

## 2015-11-23 MED ORDER — LIDOCAINE VISCOUS 2 % MT SOLN
OROMUCOSAL | Status: AC
  Filled 2015-11-23: qty 15

## 2015-11-23 MED ORDER — MEPERIDINE HCL 100 MG/ML IJ SOLN
INTRAMUSCULAR | Status: AC
  Filled 2015-11-23: qty 2

## 2015-11-23 MED ORDER — SODIUM CHLORIDE 0.9 % IV SOLN
INTRAVENOUS | Status: DC
  Administered 2015-11-23: 10:00:00 via INTRAVENOUS

## 2015-11-23 MED ORDER — MEPERIDINE HCL 100 MG/ML IJ SOLN
INTRAMUSCULAR | Status: DC | PRN
  Administered 2015-11-23: 50 mg
  Administered 2015-11-23 (×2): 25 mg

## 2015-11-23 MED ORDER — PROMETHAZINE HCL 25 MG/ML IJ SOLN
INTRAMUSCULAR | Status: AC
  Filled 2015-11-23: qty 1

## 2015-11-23 MED ORDER — PROMETHAZINE HCL 25 MG/ML IJ SOLN
12.5000 mg | Freq: Once | INTRAMUSCULAR | Status: AC
  Administered 2015-11-23: 12.5 mg via INTRAVENOUS

## 2015-11-23 MED ORDER — MIDAZOLAM HCL 5 MG/5ML IJ SOLN
INTRAMUSCULAR | Status: AC
  Filled 2015-11-23: qty 10

## 2015-11-23 MED ORDER — PANTOPRAZOLE SODIUM 40 MG PO TBEC: 40.0000 mg | DELAYED_RELEASE_TABLET | Freq: Every day | ORAL | Status: DC

## 2015-11-23 MED ORDER — SODIUM CHLORIDE 0.9% FLUSH
INTRAVENOUS | Status: AC
  Filled 2015-11-23: qty 10

## 2015-11-23 MED ORDER — LIDOCAINE VISCOUS 2 % MT SOLN
OROMUCOSAL | Status: DC | PRN
  Administered 2015-11-23: 1 via OROMUCOSAL

## 2015-11-23 MED ORDER — MIDAZOLAM HCL 5 MG/5ML IJ SOLN
INTRAMUSCULAR | Status: DC | PRN
  Administered 2015-11-23 (×3): 1 mg via INTRAVENOUS
  Administered 2015-11-23: 2 mg via INTRAVENOUS
  Administered 2015-11-23: 1 mg via INTRAVENOUS

## 2015-11-23 MED ORDER — STERILE WATER FOR IRRIGATION IR SOLN
Status: DC | PRN
  Administered 2015-11-23: 11:00:00

## 2015-11-23 NOTE — H&P (Addendum)
Primary Care Physician:  Alonza Bogus, MD Primary Gastroenterologist:  Dr. Oneida Alar  Pre-Procedure History & Physical: HPI:  Regina Mcmahon is a 65 y.o. female here for  PERSONAL HISTORY OF POLYPS/FAMILY Hx COLON CA-BROTHER HAD COLON CA AGE < 60/DYSPEPSIA.   Past Medical History  Diagnosis Date  . Hypertension   . Panic attacks   . Chronic airway obstruction ([place])   . Pain     Hx.pain in joint involving pelvic region and thigh; pain in limb  . Sinusitis   . Asthma   . Palpitations   . Hemorrhage rectum     and anus.  . Symptomatic menopausal or female climacteric states   . Asymptomatic varicose veins   . Other symptoms involving digestive system(787.99)   . Depression   . Breast cancer ([place]) 06/2014    left  . Insomnia   . COPD (chronic obstructive pulmonary disease) [facility])     Past Surgical History  Procedure Laterality Date  . Cesarean section    . Cholecystectomy  1985  . Tubal ligation    . Colonoscopy  2002    Dr. Lindalou Hose: internal hemorrhoids, one small rectal polyp, adenomatous path   . Radioactive seed guided mastectomy with axillary sentinel lymph node biopsy Left 08/20/2014    Procedure: LEFT BREAST LUMPECTOMY WITH RADIOACTIVE SEED LOCALIZATION AND SENTINEL LYMPH NODE MAPPING;  Surgeon: Erroll Luna, MD;  Location: [place];  Service: General;  Laterality: Left;  . Portacath placement Right 09/17/2014    Procedure: INSERTION PORT-A-CATH WITH ULTRASOUND;  Surgeon: Erroll Luna, MD;  Location: [employer];  Service: General;  Laterality: Right;  IJ  . Re-excision of breast lumpectomy Left 09/17/2014    Procedure: RE-EXCISION OF BREAST LUMPECTOMY;  Surgeon: Erroll Luna, MD;  Location: [place];  Service: General;  Laterality: Left;  . Port-a-cath removal  02/2015    Prior to Admission medications   Medication Sig Start Date End Date Taking? Authorizing Provider  albuterol (PROVENTIL HFA;VENTOLIN HFA) 108  (90 BASE) MCG/ACT inhaler Inhale 2 puffs into the lungs every 6 (six) hours as needed for wheezing or shortness of breath.   Yes Historical Provider, MD  ALPRAZolam Duanne Moron) 0.5 MG tablet Take 0.5 mg by mouth 3 (three) times daily as needed for anxiety.   Yes Historical Provider, MD  anastrozole (ARIMIDEX) 1 MG tablet Take 1 tablet (1 mg total) by mouth daily. 08/07/15  Yes Patrici Ranks, MD  calcium carbonate (TUMS - DOSED IN MG ELEMENTAL CALCIUM) 500 MG chewable tablet Chew 1 tablet by mouth 3 (three) times daily with meals.   Yes Historical Provider, MD  hydrochlorothiazide (MICROZIDE) 12.5 MG capsule Take 12.5 mg by mouth daily. Reported on 10/19/2015 09/29/14  Yes Historical Provider, MD  ibuprofen (ADVIL,MOTRIN) 200 MG tablet Take 600 mg by mouth every 6 (six) hours as needed for mild pain.    Yes Historical Provider, MD  pantoprazole (PROTONIX) 40 MG tablet Take 1 tablet (40 mg total) by mouth daily. 10/19/15  Yes Orvil Feil, NP  acetaminophen (TYLENOL) 325 MG tablet Take 650 mg by mouth every 6 (six) hours as needed.    Historical Provider, MD  Calcium Citrate-Vitamin D (CALCIUM + D PO) Take by mouth daily.    Historical Provider, MD  diphenhydramine-acetaminophen (TYLENOL PM) 25-500 MG TABS Take 1 tablet by mouth at bedtime.     Historical Provider, MD  docusate sodium (COLACE) 100 MG capsule Take 100 mg by mouth 3 (three) times  daily as needed for mild constipation.    Historical Provider, MD  polyethylene glycol-electrolytes (TRILYTE) 420 g solution Take 4,000 mLs by mouth as directed. 11/19/15   Orvil Feil, NP    Allergies as of 10/19/2015  . (No Known Allergies)    Family History  Problem Relation Age of Onset  . Diabetes Mother   . Ovarian cancer Mother 32  . Other Father     died in MVA at age 14  . Other Sister     mitral valve disorder  . Melanoma Sister 54    removed from back of leg  . Colon cancer Brother     early 66s, succumbed to disease  . Cancer Paternal Aunt      leukemia, bone, or lung cancer  . Alzheimer's disease Maternal Grandmother   . Heart attack Maternal Grandfather   . Heart attack Paternal Grandmother   . COPD Paternal Grandfather   . Emphysema Paternal Grandfather   . Congestive Heart Failure Paternal Aunt   . Stomach cancer Paternal Uncle   . Kidney cancer Maternal Uncle   . Cancer Cousin     dx. teens/dx. 40s  . Cancer Cousin   . Colon cancer Cousin     Social History   Social History  . Marital Status: Divorced    Spouse Name: N/A  . Number of Children: 2  . Years of Education: N/A   Occupational History  . Not on file.   Social History Main Topics  . Smoking status: Current Every Day Smoker -- 1.50 packs/day    Types: Cigarettes  . Smokeless tobacco: Never Used  . Alcohol Use: No  . Drug Use: No  . Sexual Activity: No   Other Topics Concern  . Not on file   Social History Narrative    Review of Systems: See HPI, otherwise negative ROS   Physical Exam: BP 139/78 mmHg  Pulse 73  Temp(Src) 98.1 F (36.7 C) (Oral)  Resp 20  Ht '5\' 4"'$  (1.626 m)  SpO2 91% General:   Alert,  pleasant and cooperative in NAD Head:  Normocephalic and atraumatic. Neck:  Supple; Lungs:  Clear throughout to auscultation.    Heart:  Regular rate and rhythm. Abdomen:  Soft, nontender and nondistended. Normal bowel sounds, without guarding, and without rebound.   Neurologic:  Alert and  oriented x4;  grossly normal neurologically.  Impression/Plan:    SCREENING/dyspepsia  Plan:  1. TCS/EGD TODAY

## 2015-11-23 NOTE — Discharge Instructions (Signed)
You had 4 polyps removed. 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 You have MODERATE gastritis, & duodenitis DUE TO IBUPROFEN.  I biopsied your stomach.   To CONTROL YOUR REFLUX:           1. CONTINUE YOUR WEIGHT LOSS EFFORTS. LOSE 10 LBS.      2. DRINK WATER TO KEEP YOUR URINE LIGHT YELLOW. REDUCE DAILY DOSE OF SUNDROP.      3. CUT DOWN ON YOUR SMOKING  AVOID ITEMS THAT TRIGGER GASTRITIS. SEE INFO BELOW.  FOLLOW A HIGH FIBER/LOW FAT DIET. AVOID ITEMS THAT CAUSE BLOATING. SEE INFO BELOW.  CONTINUE PROTONIX. TAKE 30 MINUTES PRIOR TO BREAKFAST FOREVER.  YOUR BIOPSY RESULTS WILL BE AVAILABLE IN MY CHART AFTER MAY 4 AND MY OFFICE WILL CONTACT YOU IN 10-14 DAYS WITH YOUR RESULTS.   Next colonoscopy in 3-5 years.   FOLLOW UP IN 4 MOS.   ENDOSCOPY Care After Read the instructions outlined below and refer to this sheet in the next week. These discharge instructions provide you with general information on caring for yourself after you leave the hospital. While your treatment has been planned according to the most current medical practices available, unavoidable complications occasionally occur. If you have any problems or questions after discharge, call DR. Breauna Mazzeo, [phone].  ACTIVITY  You may resume your regular activity, but move at a slower pace for the next 24 hours.   Take frequent rest periods for the next 24 hours.   Walking will help get rid of the air and reduce the bloated feeling in your belly (abdomen).   No driving for 24 hours (because of the medicine (anesthesia) used during the test).   You may shower.   Do not sign any important legal documents or operate any machinery for 24 hours (because of the anesthesia used during the test).    NUTRITION  Drink plenty of fluids.   You may resume your normal diet as instructed by your doctor.   Begin with a light meal and progress to your normal diet. Heavy or fried foods are harder to digest and  may make you feel sick to your stomach (nauseated).   Avoid alcoholic beverages for 24 hours or as instructed.    MEDICATIONS  You may resume your normal medications.   WHAT YOU CAN EXPECT TODAY  Some feelings of bloating in the abdomen.   Passage of more gas than usual.   Spotting of blood in your stool or on the toilet paper  .  IF YOU HAD POLYPS REMOVED DURING THE ENDOSCOPY:  Eat a soft diet IF YOU HAVE NAUSEA, BLOATING, ABDOMINAL PAIN, OR VOMITING.    FINDING OUT THE RESULTS OF YOUR TEST Not all test results are available during your visit. DR. Oneida Alar WILL CALL YOU WITHIN 14 DAYS OF YOUR PROCEDUE WITH YOUR RESULTS. Do not assume everything is normal if you have not heard from DR. Berdena Cisek, CALL HER OFFICE AT [phone].  SEEK IMMEDIATE MEDICAL ATTENTION AND CALL THE OFFICE: [phone] IF:  You have more than a spotting of blood in your stool.   Your belly is swollen (abdominal distention).   You are nauseated or vomiting.   You have a temperature over 101F.   You have abdominal pain or discomfort that is severe or gets worse throughout the day.   Gastritis/DUODENITIS  Gastritis/DUODENITIS is inflammation (the body's way of reacting to injury and/or infection) of the stomach/SMALLBOWEL. It is often caused by viral or bacterial (germ) infections.  It can also be caused BY ASPIRIN, BC/GOODY POWDER'S, (IBUPROFEN) MOTRIN, OR ALEVE (NAPROXEN), chemicals (including alcohol), SPICY FOODS, and medications. This illness may be associated with generalized malaise (feeling tired, not well), UPPER ABDOMINAL STOMACH cramps, and fever. One common bacterial cause of gastritis is an organism known as H. Pylori. This can be treated with antibiotics.    High-Fiber Diet A high-fiber diet changes your normal diet to include more whole grains, legumes, fruits, and vegetables. Changes in the diet involve replacing refined carbohydrates with unrefined foods. The calorie level of the diet  is essentially unchanged. The Dietary Reference Intake (recommended amount) for adult males is 38 grams per day. For adult females, it is 25 grams per day. Pregnant and lactating women should consume 28 grams of fiber per day. Fiber is the intact part of a plant that is not broken down during digestion. Functional fiber is fiber that has been isolated from the plant to provide a beneficial effect in the body. PURPOSE  Increase stool bulk.   Ease and regulate bowel movements.   Lower cholesterol.  REDUCE RISK OF COLON CANCER  INDICATIONS THAT YOU NEED MORE FIBER  Constipation and hemorrhoids.   Uncomplicated diverticulosis (intestine condition) and irritable bowel syndrome.   Weight management.   As a protective measure against hardening of the arteries (atherosclerosis), diabetes, and cancer.   GUIDELINES FOR INCREASING FIBER IN THE DIET  Start adding fiber to the diet slowly. A gradual increase of about 5 more grams (2 slices of whole-wheat bread, 2 servings of most fruits or vegetables, or 1 bowl of high-fiber cereal) per day is best. Too rapid an increase in fiber may result in constipation, flatulence, and bloating.   Drink enough water and fluids to keep your urine clear or pale yellow. Water, juice, or caffeine-free drinks are recommended. Not drinking enough fluid may cause constipation.   Eat a variety of high-fiber foods rather than one type of fiber.   Try to increase your intake of fiber through using high-fiber foods rather than fiber pills or supplements that contain small amounts of fiber.   The goal is to change the types of food eaten. Do not supplement your present diet with high-fiber foods, but replace foods in your present diet.   INCLUDE A VARIETY OF FIBER SOURCES  Replace refined and processed grains with whole grains, canned fruits with fresh fruits, and incorporate other fiber sources. White rice, white breads, and most bakery goods contain little or no  fiber.   Brown whole-grain rice, buckwheat oats, and many fruits and vegetables are all good sources of fiber. These include: broccoli, Brussels sprouts, cabbage, cauliflower, beets, sweet potatoes, white potatoes (skin on), carrots, tomatoes, eggplant, squash, berries, fresh fruits, and dried fruits.   Cereals appear to be the richest source of fiber. Cereal fiber is found in whole grains and bran. Bran is the fiber-rich outer coat of cereal grain, which is largely removed in refining. In whole-grain cereals, the bran remains. In breakfast cereals, the largest amount of fiber is found in those with "bran" in their names. The fiber content is sometimes indicated on the label.   You may need to include additional fruits and vegetables each day.   In baking, for 1 cup white flour, you may use the following substitutions:   1 cup whole-wheat flour minus 2 tablespoons.   1/2 cup white flour plus 1/2 cup whole-wheat flour.   Low-Fat Diet BREADS, CEREALS, PASTA, RICE, DRIED PEAS, AND BEANS These products are  high in carbohydrates and most are low in fat. Therefore, they can be increased in the diet as substitutes for fatty foods. They too, however, contain calories and should not be eaten in excess. Cereals can be eaten for snacks as well as for breakfast.  Include foods that contain fiber (fruits, vegetables, whole grains, and legumes). Research shows that fiber may lower blood cholesterol levels, especially the water-soluble fiber found in fruits, vegetables, oat products, and legumes. FRUITS AND VEGETABLES It is good to eat fruits and vegetables. Besides being sources of fiber, both are rich in vitamins and some minerals. They help you get the daily allowances of these nutrients. Fruits and vegetables can be used for snacks and desserts. MEATS Limit lean meat, chicken, [place], and fish to no more than 6 ounces per day. Beef, Pork, and Lamb Use lean cuts of beef, pork, and lamb. Lean cuts include:   Extra-lean ground beef.  Arm roast.  Sirloin tip.  Center-cut ham.  Round steak.  Loin chops.  Rump roast.  Tenderloin.  Trim all fat off the outside of meats before cooking. It is not necessary to severely decrease the intake of red meat, but lean choices should be made. Lean meat is rich in protein and contains a highly absorbable form of iron. Premenopausal women, in particular, should avoid reducing lean red meat because this could increase the risk for low red blood cells (iron-deficiency anemia). The organ meats, such as liver, sweetbreads, kidneys, and brain are very rich in cholesterol. They should be limited. Chicken and [place] These are good sources of protein. The fat of poultry can be reduced by removing the skin and underlying fat layers before cooking. Chicken and [place] can be substituted for lean red meat in the diet. Poultry should not be fried or covered with high-fat sauces. Fish and Shellfish Fish is a good source of protein. Shellfish contain cholesterol, but they usually are low in saturated fatty acids. The preparation of fish is important. Like chicken and [place], they should not be fried or covered with high-fat sauces. EGGS Egg whites contain no fat or cholesterol. They can be eaten often. Try 1 to 2 egg whites instead of whole eggs in recipes or use egg substitutes that do not contain yolk. MILK AND DAIRY PRODUCTS Use skim or 1% milk instead of 2% or whole milk. Decrease whole milk, natural, and processed cheeses. Use nonfat or low-fat (2%) cottage cheese or low-fat cheeses made from vegetable oils. Choose nonfat or low-fat (1 to 2%) yogurt. Experiment with evaporated skim milk in recipes that call for heavy cream. Substitute low-fat yogurt or low-fat cottage cheese for sour cream in dips and salad dressings. Have at least 2 servings of low-fat dairy products, such as 2 glasses of skim (or 1%) milk each day to help get your daily calcium intake.  FATS AND OILS Reduce  the total intake of fats, especially saturated fat. Butterfat, lard, and beef fats are high in saturated fat and cholesterol. These should be avoided as much as possible. Vegetable fats do not contain cholesterol, but certain vegetable fats, such as coconut oil, palm oil, and palm kernel oil are very high in saturated fats. These should be limited. These fats are often used in bakery goods, processed foods, popcorn, oils, and nondairy creamers. Vegetable shortenings and some peanut butters contain hydrogenated oils, which are also saturated fats. Read the labels on these foods and check for saturated vegetable oils. Unsaturated vegetable oils and fats do not raise blood  cholesterol. However, they should be limited because they are fats and are high in calories. Total fat should still be limited to 30% of your daily caloric intake. Desirable liquid vegetable oils are corn oil, cottonseed oil, olive oil, canola oil, safflower oil, soybean oil, and sunflower oil. Peanut oil is not as good, but small amounts are acceptable. Buy a heart-healthy tub margarine that has no partially hydrogenated oils in the ingredients. Mayonnaise and salad dressings often are made from unsaturated fats, but they should also be limited because of their high calorie and fat content. Seeds, nuts, peanut butter, olives, and avocados are high in fat, but the fat is mainly the unsaturated type. These foods should be limited mainly to avoid excess calories and fat. OTHER EATING TIPS Snacks  Most sweets should be limited as snacks. They tend to be rich in calories and fats, and their caloric content outweighs their nutritional value. Some good choices in snacks are graham crackers, melba toast, soda crackers, bagels (no egg), English muffins, fruits, and vegetables. These snacks are preferable to snack crackers, [place] fries, and chips. Popcorn should be air-popped or cooked in small amounts of liquid vegetable oil. Desserts Eat fruit,  low-fat yogurt, and fruit ices. AVOID pastries, cake, and cookies. Sherbet, angel food cake, gelatin dessert, frozen low-fat yogurt, or other frozen products that do not contain saturated fat (pure fruit juice bars, frozen ice pops) are also acceptable.  COOKING METHODS Choose those methods that use little or no fat. They include: Poaching.  Braising.  Steaming.  Grilling.  Baking.  Stir-frying.  Broiling.  Microwaving.  Foods can be cooked in a nonstick pan without added fat, or use a nonfat cooking spray in regular cookware. Limit fried foods and avoid frying in saturated fat. Add moisture to lean meats by using water, broth, cooking wines, and other nonfat or low-fat sauces along with the cooking methods mentioned above. Soups and stews should be chilled after cooking. The fat that forms on top after a few hours in the refrigerator should be skimmed off. When preparing meals, avoid using excess salt. Salt can contribute to raising blood pressure in some people. EATING AWAY FROM HOME Order entres, potatoes, and vegetables without sauces or butter. When meat exceeds the size of a deck of cards (3 to 4 ounces), the rest can be taken home for another meal. Choose vegetable or fruit salads and ask for low-calorie salad dressings to be served on the side. Use dressings sparingly. Limit high-fat toppings, such as bacon, crumbled eggs, cheese, sunflower seeds, and olives. Ask for heart-healthy tub margarine instead of butter.   Diverticulosis Diverticulosis is a common condition that develops when small pouches (diverticula) form in the wall of the colon. The risk of diverticulosis increases with age. It happens more often in people who eat a low-fiber diet. Most individuals with diverticulosis have no symptoms. Those individuals with symptoms usually experience belly (abdominal) pain, constipation, or loose stools (diarrhea).  HOME CARE INSTRUCTIONS  Increase the amount of fiber in your diet as  directed by your caregiver or dietician. This may reduce symptoms of diverticulosis.   Drink at least 6 to 8 glasses of water each day to prevent constipation.   Try not to strain when you have a bowel movement.   Avoiding nuts and seeds to prevent complications is NOT NECESSARY.    FOODS HAVING HIGH FIBER CONTENT INCLUDE:  Fruits. Apple, peach, pear, tangerine, raisins, prunes.   Vegetables. Brussels sprouts, asparagus, broccoli, cabbage, carrot, cauliflower,  romaine lettuce, spinach, summer squash, tomato, winter squash, zucchini.   Starchy Vegetables. Baked beans, kidney beans, lima beans, split peas, lentils, potatoes (with skin).   Grains. Whole wheat bread, brown rice, bran flake cereal, plain oatmeal, white rice, shredded wheat, bran muffins.   Polyps, Colon  A polyp is extra tissue that grows inside your body. Colon polyps grow in the large intestine. The large intestine, also called the colon, is part of your digestive system. It is a long, hollow tube at the end of your digestive tract where your body makes and stores stool. Most polyps are not dangerous. They are benign. This means they are not cancerous. But over time, some types of polyps can turn into cancer. Polyps that are smaller than a pea are usually not harmful. But larger polyps could someday become or may already be cancerous. To be safe, doctors remove all polyps and test them.   PREVENTION There is not one sure way to prevent polyps. You might be able to lower your risk of getting them if you:  Eat more fruits and vegetables and less fatty food.   Do not smoke.   Avoid alcohol.   Exercise every day.   Lose weight if you are overweight.   Eating more calcium and folate can also lower your risk of getting polyps. Some foods that are rich in calcium are milk, cheese, and broccoli. Some foods that are rich in folate are chickpeas, kidney beans, and spinach.

## 2015-11-23 NOTE — Op Note (Signed)
[facility] Patient Name: Regina Mcmahon Procedure Date: 11/23/2015 11:14 AM MRN: [id number] Date of Birth: 07/27/1950 Attending MD: Barney Drain , MD CSN: [id number] Age: 65 Admit Type: Outpatient Procedure:                Upper GI endoscopy Indications:              Dyspepsia, Heartburn Providers:                Barney Drain, MD, Janeece Riggers, RN, Randa Spike,                            Technician Referring MD:             Jasper Loser. Luan Pulling, MD Medicines:                Meperidine 25 mg IV, Midazolam 1 mg IV + TCS Complications:            No immediate complications. Estimated Blood Loss:     Estimated blood loss was minimal. Procedure:                Pre-Anesthesia Assessment:                           - Prior to the procedure, a History and Physical                            was performed, and patient medications and                            allergies were reviewed. The patient's tolerance of                            previous anesthesia was also reviewed. The risks                            and benefits of the procedure and the sedation                            options and risks were discussed with the patient.                            All questions were answered, and informed consent                            was obtained. Prior Anticoagulants: The patient has                            taken no previous anticoagulant or antiplatelet                            agents. ASA Grade Assessment: II - A patient with                            mild systemic disease. After reviewing the risks  and benefits, the patient was deemed in                            satisfactory condition to undergo the procedure.                           After obtaining informed consent, the endoscope was                            passed under direct vision. Throughout the                            procedure, the patient's blood pressure, pulse, and            oxygen saturations were monitored continuously. The                            [id number] ([id number]) scope was introduced through the                            mouth, and advanced to the second part of duodenum.                            The upper GI endoscopy was accomplished with ease.                            The patient tolerated the procedure well. Scope In: 11:20:13 AM Scope Out: 11:25:08 AM Total Procedure Duration: 0 hours 4 minutes 55 seconds  Findings:      The examined esophagus was normal.      Scattered mild inflammation characterized by congestion (edema),       erosions and erythema was found in the gastric antrum. Biopsies were       taken with a cold forceps for Helicobacter pylori testing.      Diffuse moderately erythematous mucosa without active bleeding and with       no stigmata of bleeding was found in the duodenal bulb and in the second       portion of the duodenum. Impression:               - Normal esophagus.                           - Gastritis. Biopsied.                           - Erythematous duodenopathy. Moderate Sedation:      Moderate (conscious) sedation was administered by the endoscopy nurse       and supervised by the endoscopist. The following parameters were       monitored: oxygen saturation, heart rate, blood pressure, and response       to care. Total physician intraservice time was 50 minutes. Recommendation:           - Patient has a contact number available for                            emergencies. The signs and symptoms  of potential                            delayed complications were discussed with the                            patient. Return to normal activities tomorrow.                            Written discharge instructions were provided to the                            patient.                           - High fiber diet and low fat diet.                           - Continue present medications.                            - Await pathology results.                           - Return to my office in 4 months.                           To CONTROL YOUR REFLUX:                           1. CONTINUE WEIGHT LOSS EFFORTS. LOSE 10 LBS.                           2. DRINK WATER TO KEEP URINE LIGHT YELLOW. REDUCE                            DAILY DOSE OF SUNDROP.                           3. CUT DOWN ON YOUR SMOKING                           AVOID ITEMS THAT TRIGGER GASTRITIS.                           CONTINUE PROTONIX. TAKE 30 MINUTES PRIOR TO                            BREAKFAST FOREVER.                           Next colonoscopy in 3-5 years.                           FOLLOW UP IN 4 MOS. Procedure Code(s):        --- Professional ---  01655, Esophagogastroduodenoscopy, flexible,                            transoral; with biopsy, single or multiple                           99153, Moderate sedation services; each additional                            15 minutes intraservice time                           99153, Moderate sedation services; each additional                            15 minutes intraservice time                           G0500, Moderate sedation services provided by the                            same physician or other qualified health care                            professional performing a gastrointestinal                            endoscopic service that sedation supports,                            requiring the presence of an independent trained                            observer to assist in the monitoring of the                            patient's level of consciousness and physiological                            status; initial 15 minutes of intra-service time;                            patient age 54 years or older (additional time may                            be reported with [phone], as appropriate) Diagnosis Code(s):        --- Professional ---                            K29.70, Gastritis, unspecified, without bleeding                           K31.89, Other diseases of stomach and duodenum  R10.13, Epigastric pain                           R12, Heartburn CPT copyright 2016 [employer]. All rights reserved. The codes documented in this report are preliminary and upon coder review may  be revised to meet current compliance requirements. Barney Drain, MD Barney Drain, MD 11/23/2015 3:14:11 PM This report has been signed electronically. Number of Addenda: 0

## 2015-11-23 NOTE — Progress Notes (Signed)
REVIEWED-NO ADDITIONAL RECOMMENDATIONS. 

## 2015-11-23 NOTE — Op Note (Signed)
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 Luan Pulling, MD Medicines:                Meperidine 75 mg IV, Midazolam 5 mg IV,                            Promethazine 65.0 mg IV Complications:            No immediate complications. Estimated Blood Loss:     Estimated blood loss was minimal. Procedure:                Pre-Anesthesia Assessment:                           - Prior to the procedure, a History and Physical                            was performed, and patient medications and                            allergies were reviewed. The patient's tolerance of                            previous anesthesia was also reviewed. The risks                            and benefits of the procedure and the sedation                            options and risks were discussed with the patient.                            All questions were answered, and informed consent                            was obtained. Prior Anticoagulants: The patient has                            taken no previous anticoagulant or antiplatelet  agents. ASA Grade Assessment: II - A patient with                            mild systemic disease. After reviewing the risks                            and benefits, the patient was deemed in                            satisfactory condition to undergo the procedure.                           After obtaining informed consent, the colonoscope                            was  passed under direct vision. Throughout the                            procedure, the patient's blood pressure, pulse, and                            oxygen saturations were monitored continuously. The                            EC-[id number] ([id number]) scope was introduced through                            the anus and advanced to the the cecum, identified                            by appendiceal orifice and ileocecal valve. The                            ileocecal valve, appendiceal orifice, and rectum                            were photographed. The colonoscopy was somewhat                            difficult due to significant looping. Successful                            completion of the procedure was aided by increasing                            the dose of sedation medication, changing the                            patient to a prone position and using manual                            pressure. The quality of the bowel preparation was  excellent. Scope In: 10:44:03 AM Scope Out: 11:13:19 AM Scope Withdrawal Time: 0 hours 22 minutes 26 seconds  Total Procedure Duration: 0 hours 29 minutes 16 seconds  Findings:      The digital rectal exam findings include non-thrombosed external       hemorrhoids.      Three sessile polyps were found in the descending colon, ascending colon       and cecum. The polyps were 6 to 8 mm in size. These polyps were removed       with a hot snare. Resection and retrieval were complete. Estimated blood       loss was minimal.      A 4 mm polyp was found in the rectum. The polyp was sessile. The polyp       was removed with a cold snare. Resection and retrieval were complete.       Estimated blood loss was minimal.      A few small and large-mouthed diverticula were found in the sigmoid       colon, descending colon and distal transverse colon. Impression:               - Non-thrombosed external hemorrhoids found on                             digital rectal exam.                           - Three 6 to 8 mm polyps in the descending colon,                            in the ascending colon and in the cecum, removed                            with a hot snare. Resected and retrieved.                           - One 4 mm polyp in the rectum, removed with a cold                            snare. Resected and retrieved.                           - Moderate diverticulosis in the sigmoid colon, in                            the descending colon and in the distal transverse                            colon. Moderate Sedation:      Moderate (conscious) sedation was administered by the endoscopy nurse       and supervised by the endoscopist. The following parameters were       monitored: oxygen saturation, heart rate, blood pressure, and response       to care. Total physician intraservice time was 50 minutes. Recommendation:           - Patient has a contact number available for  emergencies. The signs and symptoms of potential                            delayed complications were discussed with the                            patient. Return to normal activities tomorrow.                            Written discharge instructions were provided to the                            patient.                           To CONTROL YOUR REFLUX:                           1. CONTINUE WEIGHT LOSS EFFORTS. LOSE 10 LBS.                           2. DRINK WATER TO KEEP URINE LIGHT YELLOW. REDUCE                            DAILY DOSE OF SUNDROP.                           3. CUT DOWN ON YOUR SMOKING                           AVOID ITEMS THAT TRIGGER GASTRITIS. Marland Kitchen                           CONTINUE PROTONIX. TAKE 30 MINUTES PRIOR TO                            BREAKFAST FOREVER.                           Next colonoscopy in 3-5 years.                           FOLLOW UP IN 4 MOS.                           - High  fiber diet and low fat diet.                           - Continue present medications.                           - Await pathology results.                           - Repeat colonoscopy in 3 - 5 years for  surveillance. Procedure Code(s):        --- Professional ---                           [phone], Colonoscopy, flexible; with removal of                            tumor(s), polyp(s), or other lesion(s) by snare                            technique                           99153, Moderate sedation services; each additional                            15 minutes intraservice time                           99153, Moderate sedation services; each additional                            15 minutes intraservice time                           G0500, Moderate sedation services provided by the                            same physician or other qualified health care                            professional performing a gastrointestinal                            endoscopic service that sedation supports,                            requiring the presence of an independent trained                            observer to assist in the monitoring of the                            patient's level of consciousness and physiological                            status; initial 15 minutes of intra-service time;                            patient age 17 years or older (additional time may                            be reported with [phone], as appropriate) Diagnosis Code(s):        --- Professional ---  D12.4, Benign neoplasm of descending colon                           D12.2, Benign neoplasm of ascending colon                           D12.0, Benign neoplasm of cecum                           K62.1, Rectal polyp                           K64.4, Residual hemorrhoidal skin tags                           D12.6, Benign neoplasm of colon, unspecified                            Z80.0, Family history of malignant neoplasm of                            digestive organs                           K57.30, Diverticulosis of large intestine without                            perforation or abscess without bleeding CPT copyright 2016 American Medical Association. All rights reserved. The codes documented in this report are preliminary and upon coder review may  be revised to meet current compliance requirements. Barney Drain, MD Barney Drain, MD 11/23/2015 3:10:01 PM This report has been signed electronically. Number of Addenda: 0

## 2015-12-04 ENCOUNTER — Encounter (HOSPITAL_COMMUNITY): Payer: Self-pay | Admitting: Gastroenterology

## 2015-12-07 ENCOUNTER — Encounter (HOSPITAL_COMMUNITY): Payer: Self-pay | Admitting: Hematology & Oncology

## 2015-12-07 ENCOUNTER — Encounter (HOSPITAL_COMMUNITY): Payer: BLUE CROSS/BLUE SHIELD | Attending: Hematology & Oncology | Admitting: Hematology & Oncology

## 2015-12-07 VITALS — BP 165/85 | HR 73 | Temp 98.6°F | Resp 20 | Wt 249.0 lb

## 2015-12-07 DIAGNOSIS — M5441 Lumbago with sciatica, right side: Secondary | ICD-10-CM

## 2015-12-07 DIAGNOSIS — C50412 Malignant neoplasm of upper-outer quadrant of left female breast: Secondary | ICD-10-CM

## 2015-12-07 DIAGNOSIS — I159 Secondary hypertension, unspecified: Secondary | ICD-10-CM | POA: Diagnosis not present

## 2015-12-07 DIAGNOSIS — Z720 Tobacco use: Secondary | ICD-10-CM | POA: Diagnosis not present

## 2015-12-07 MED ORDER — TAMOXIFEN CITRATE 20 MG PO TABS: 20.0000 mg | ORAL_TABLET | Freq: Every day | ORAL | Status: DC

## 2015-12-07 MED ORDER — CELECOXIB 200 MG PO CAPS: 200.0000 mg | ORAL_CAPSULE | Freq: Two times a day (BID) | ORAL | Status: DC

## 2015-12-07 NOTE — Patient Instructions (Addendum)
[place] at [facility] Discharge Instructions  RECOMMENDATIONS MADE BY THE CONSULTANT AND ANY TEST RESULTS WILL BE SENT TO YOUR REFERRING PHYSICIAN.  Exam and discussion today with Dr. Whitney Muse.  MRI as scheduled. Office visit in 6 weeks. Prescriptions for celebrex and tamoxifen.    Thank you for choosing [place] at [facility] to provide your oncology and hematology care.  To afford each patient quality time with our provider, please arrive at least 15 minutes before your scheduled appointment time.   Beginning January 23rd 2017 lab work for the [facility] will be done in the  Main lab at [facility] on 1st floor. If you have a lab appointment with the [facility] please come in thru the  Main Entrance and check in at the main information desk  You need to re-schedule your appointment should you arrive 10 or more minutes late.  We strive to give you quality time with our providers, and arriving late affects you and other patients whose appointments are after yours.  Also, if you no show three or more times for appointments you may be dismissed from the clinic at the providers discretion.     Again, thank you for choosing [facility].  Our hope is that these requests will decrease the amount of time that you wait before being seen by our physicians.       _____________________________________________________________  Should you have questions after your visit to [facility], please contact our office at [phone] between the hours of 8:30 a.m. and 4:30 p.m.  Voicemails left after 4:30 p.m. will not be returned until the following business day.  For prescription refill requests, have your pharmacy contact our office.         Resources For Cancer Patients and their Caregivers ? American Cancer Society: Can assist with transportation, wigs, general needs, runs Look Good Feel Better.         (320)228-2290 ? Cancer Care: Provides financial assistance, online support groups, medication/co-pay assistance.  1-800-813-HOPE (716) 633-4393) ? [place] Assists [place] cancer patients and their families through emotional , educational and financial support.  864-612-5865 ? [place] DSS Where to apply for food stamps, Medicaid and utility assistance. (662)333-1042 ? [facility]: Transportation to medical appointments. 708 146 9118 ? Social Security Administration: May apply for disability if have a Stage IV cancer. 682-448-3471 469-446-8125 ? [employer], Disability and Transit Services: Assists with nutrition, care and transit needs. [place] Support Programs: '@10RELATIVEDAYS'$ @ > Cancer Support Group  2nd Tuesday of the month 1pm-2pm, Journey Room  > Creative Journey  3rd Tuesday of the month 1130am-1pm, Journey Room  > Look Good Feel Better  1st Wednesday of the month 10am-12 noon, Journey Room (Call [place] to register [phone])

## 2015-12-07 NOTE — Progress Notes (Signed)
[place] Progress Note  Patient Care Team: Sinda Du, MD as PCP - General (Internal Medicine) Patrici Ranks, MD as Consulting Physician (Hematology and Oncology) Erroll Luna, MD as Consulting Physician (General Surgery) Thea Silversmith, MD as Consulting Physician (Radiation Oncology) Sylvan Cheese, NP as Nurse Practitioner (Hematology and Oncology) Danie Binder, MD as Consulting Physician (Gastroenterology)  CHIEF COMPLAINTS/PURPOSE OF CONSULTATION:  L breast cancer History of L breast core biopsy for microcalcifications in 2014, fibroadenoma. CMF cycle 1 on 10/02/2014 No matching staging information was found for the patient.  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    06/24/2014 Breast US Left breast: ill-defined predominantly hypoechoic area with some increased echogenicity in the 2 o'clock position of the left breast, 7 cm from the nipple. 2.6 x 2.3 x 1.8 cm in maximum dimensions.   07/01/2014 Initial Biopsy Left breast core needle bx: Invasive mammary carcinoma with lobular features, ER+ (100%), PR+ (91%), HER2/neu negative (ratio 1.09), Ki-67 32%. E-cadherin strongly positive, diagnostic for IDC    07/01/2014 Clinical Stage Stage IIA: T2 N0   08/20/2014 Definitive Surgery Left breast seed localized lumpectomy/SLNB: IDC, +LVI, DCIS, 1 LN removed and positive for metastatic carcinoma. Grade 2. HER2/neu repeated and negative (ratio 0.69-1.9).    08/20/2014 Pathologic Stage Stage IIB: pT2 pN1a pMx   09/17/2014 Surgery Port-a cath placement  and re-excision   10/02/2014 - 01/15/2015 Chemotherapy CMF x 6 cycles.  patient refused Taxane-containing chemotherapy.   03/07/2015 Procedure Comp Cancer Panel reveals no variant at APC, ATM, AXIN2, BARD1, BMPR1A, BRCA1, BRCA2, BRIP1, CDH1, CDK4, CDKN2A, CHEK2, FANCC, MLH1, MSH2, MSH6, MUTYH, NBN, PALB2, PMS2, POLD1, POLE, PTEN, RAD51C, RAD51D, SMAD4, STK11, TP53, VHL, and XRCC2.   04/06/2015 - 05/21/2015 Radiation Therapy Adjuvant RT Pablo Ledger): Left breast/ 45 Gy over 25 fractions.  Left supraclavicular fossa and axilla/ 45 Gy over 25 fractions. Left breast boost/ 16 Gy over 8 fractions. Total dose: 61 Gy   06/04/2015 -  Anti-estrogen oral therapy Exemestane 25 mg daily.  Planned duration of therapy 5 years.    07/23/2015 Survivorship Survivorship care plan completed and mailed to patient in lieu of in person visit    HISTORY OF PRESENTING ILLNESS:  Regina Mcmahon 65 y.o. 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 She has completed chemotherapy without any significant complication. She has completed XRT. She was intolerant of aromasin. She has discontinued her arimidex because she notes she feels "terrible and depressed on it."  Ms. Muzio is unaccompanied.   When asked if she would like to switch to Tamoxifen, she states "Do I really have to take this?". Admitting she has heard and read terrible things about Tamoxifen. She questioned whether she could just have her ovaries taken out.   Reports right knee pain that radiates into her groin area. She notes her LBP and leg pain have been getting worse.  She "gives out very easily". She has not regained the strength she used to have  prior to chemotherapy. When she gets up in the morning, she hardly can get out of bed due to joint pain and her back and leg pain. She takes ibuprofen to manage this, taking 3 every morning, 3 in the afternoon, and 3 before bed. She has been doing this since  she was taken off the pain medication. Dr. Oneida Alar told her her GI tract may be messed up due to this ibuprofen. Notes increased bruising on her arms. She underwent Colonoscopy and EGD with Dr. Oneida Alar.   When asked if she has been active, she says "I feel like it". However, she has not been out like she used to.   States she needs to see her gynecologist soon.  She continues to smoke, though not as much. She has ordered a magnet to be placed in the ear that is supposed to help decrease cigarette cravings.     MEDICAL HISTORY:  Past Medical History  Diagnosis Date  . Hypertension   . Panic attacks   . Chronic airway obstruction ([place])   . Pain     Hx.pain in joint involving pelvic region and thigh; pain in limb  . Sinusitis   . Asthma   . Palpitations   . Hemorrhage rectum     and anus.  . Symptomatic menopausal or female climacteric states   . Asymptomatic varicose veins   . Other symptoms involving digestive system(787.99)   . Depression   . Breast cancer ([place]) 06/2014    left  . Insomnia   . COPD (chronic obstructive pulmonary disease) ([place])     SURGICAL HISTORY: Past Surgical History  Procedure Laterality Date  . Cesarean section    . Cholecystectomy  1985  . Tubal ligation    . Colonoscopy  2002    Dr. Lindalou Hose: internal hemorrhoids, one small rectal polyp, adenomatous path   . Radioactive seed guided mastectomy with axillary sentinel lymph node biopsy Left 08/20/2014    Procedure: LEFT BREAST LUMPECTOMY WITH RADIOACTIVE SEED LOCALIZATION AND SENTINEL LYMPH NODE MAPPING;  Surgeon: Erroll Luna, MD;  Location: [facility];  Service: General;  Laterality: Left;  . Portacath placement Right 09/17/2014    Procedure: INSERTION PORT-A-CATH WITH ULTRASOUND;  Surgeon: Erroll Luna, MD;  Location: [facility];  Service: General;  Laterality: Right;  IJ  . Re-excision of breast lumpectomy Left 09/17/2014    Procedure: RE-EXCISION OF BREAST  LUMPECTOMY;  Surgeon: Erroll Luna, MD;  Location: [facility];  Service: General;  Laterality: Left;  . Port-a-cath removal  02/2015  . Colonoscopy N/A 11/23/2015    Procedure: COLONOSCOPY;  Surgeon: Danie Binder, MD;  [facility];  Service: Endoscopy;  Laterality: N/A;  1100 - moved to 10:45 - office to notify  . Esophagogastroduodenoscopy N/A 11/23/2015    Procedure: ESOPHAGOGASTRODUODENOSCOPY (EGD);  Surgeon: Danie Binder, MD;  [facility];  Service: Endoscopy;  Laterality: N/A;    SOCIAL HISTORY: Social History   Social History  . Marital Status: Divorced    Spouse Name: N/A  . Number of Children: 2  . Years of Education: N/A   Occupational History  . Not on file.   Social History Main Topics  . Smoking status: Current Every Day Smoker -- 1.50 packs/day    Types: Cigarettes  . Smokeless tobacco: Never Used  . Alcohol Use: No  . Drug Use: No  . Sexual Activity: No   Other Topics Concern  . Not on file  Social History Narrative  Smokes 2 PPD   FAMILY HISTORY: Family History  Problem Relation Age of Onset  . Diabetes Mother   . Ovarian cancer Mother 19  . Other Father     died in MVA at age 38  . Other Sister     mitral valve disorder  . Melanoma Sister 54    removed from back of leg  . Colon cancer Brother     early 18s, succumbed to disease  . Cancer Paternal Aunt     leukemia, bone, or lung cancer  . Alzheimer's disease Maternal Grandmother   . Heart attack Maternal Grandfather   . Heart attack Paternal Grandmother   . COPD Paternal Grandfather   . Emphysema Paternal Grandfather   . Congestive Heart Failure Paternal Aunt   . Stomach cancer Paternal Uncle   . Kidney cancer Maternal Uncle   . Cancer Cousin     dx. teens/dx. 40s  . Cancer Cousin   . Colon cancer Cousin    indicated that her mother is deceased. She indicated that her father is deceased. She indicated that her sister is alive. She indicated that her  brother is deceased. She indicated that her maternal grandmother is deceased. She indicated that her maternal grandfather is deceased. She indicated that her paternal grandmother is deceased. She indicated that her paternal grandfather is deceased. She indicated that her daughter is alive. She indicated that her son is alive. She indicated that only one of her two maternal aunts is alive. She indicated that only one of her three maternal uncles is alive. She indicated that only one of her three paternal aunts is alive. She indicated that both of her paternal uncles are deceased. She indicated that both of her cousins are deceased.   ALLERGIES:  has No Known Allergies.  MEDICATIONS:  Current Outpatient Prescriptions  Medication Sig Dispense Refill  . albuterol (PROVENTIL HFA;VENTOLIN HFA) 108 (90 BASE) MCG/ACT inhaler Inhale 2 puffs into the lungs every 6 (six) hours as needed for wheezing or shortness of breath.    . ALPRAZolam (XANAX) 0.5 MG tablet Take 0.5 mg by mouth 3 (three) times daily as needed for anxiety.    . hydrochlorothiazide (MICROZIDE) 12.5 MG capsule Take 12.5 mg by mouth daily. Reported on 12/07/2015    . ibuprofen (ADVIL,MOTRIN) 200 MG tablet Take 600 mg by mouth every 6 (six) hours as needed for mild pain.     . pantoprazole (PROTONIX) 40 MG tablet Take 1 tablet (40 mg total) by mouth daily. 90 tablet 3  . acetaminophen (TYLENOL) 325 MG tablet Take 650 mg by mouth every 6 (six) hours as needed. Reported on 12/07/2015    . anastrozole (ARIMIDEX) 1 MG tablet Take 1 tablet (1 mg total) by mouth daily. (Patient not taking: Reported on 12/07/2015) 30 tablet 3  . calcium carbonate (TUMS - DOSED IN MG ELEMENTAL CALCIUM) 500 MG chewable tablet Chew 1 tablet by mouth 3 (three) times daily with meals. Reported on 12/07/2015    . Calcium Citrate-Vitamin D (CALCIUM + D PO) Take by mouth daily. Reported on 12/07/2015    . celecoxib (CELEBREX) 200 MG capsule Take 1 capsule (200 mg total) by mouth 2  (two) times daily. 60 capsule 2  . diphenhydramine-acetaminophen (TYLENOL PM) 25-500 MG TABS Take 1 tablet by mouth at bedtime. Reported on 12/07/2015    . docusate sodium (COLACE) 100 MG capsule Take 100 mg by mouth 3 (three) times daily as needed for mild constipation. Reported  on 12/07/2015    . tamoxifen (NOLVADEX) 20 MG tablet Take 1 tablet (20 mg total) by mouth daily. 30 tablet 3   No current facility-administered medications for this visit.    Review of Systems  Constitutional: Positive for weight gain. Negative for fever, chills, weight loss and malaise/fatigue.  HENT: Negative for congestion, hearing loss, nosebleeds, sore throat and tinnitus.   Eyes: Negative for blurred vision, double vision, pain and discharge.  Respiratory: Negative for cough, hemoptysis, sputum production, shortness of breath and wheezing.  Cardiovascular: Negative for chest pain, palpitations, claudication, leg swelling and PND.  Gastrointestinal: Positive for heartburn. Negative for nausea, vomiting, abdominal pain, diarrhea, constipation, blood in stool and melena.  Genitourinary: Negative for dysuria, urgency, frequency and hematuria.  Musculoskeletal: Positive for hip, knee, back, and joint pain. Negative for myalgias and falls.       Right knee pain that radiates into the groin.Back pain Skin: Negative for itching and rash.  Neurological: Negative for dizziness, tingling, tremors, sensory change, speech change, focal weakness, seizures, loss of consciousness, weakness and headaches.  Endo/Heme/Allergies: Does not bruise/bleed easily.  Psychiatric/Behavioral: Positive for depression. Negative for suicidal ideas, memory loss and substance abuse. The patient is not nervous/anxious and does not have insomnia.       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 Obese. HENT:  Head: Normocephalic and atraumatic.  Nose: Nose normal.  Mouth/Throat: Oropharynx is clear and moist. No oropharyngeal exudate.  Eyes: Conjunctivae and EOM are normal. Pupils are equal, round, and reactive to light. Right eye exhibits no discharge. Left eye exhibits no discharge. No scleral icterus.  Neck: Normal range of motion. Neck supple. No tracheal deviation present. No thyromegaly present.  Cardiovascular: Normal rate, regular rhythm and normal heart sounds.  Exam reveals no gallop and no friction rub.   No murmur heard. Pulmonary/Chest: Effort normal and breath sounds normal. She has no wheezes. She has no rales.  Abdominal: Soft. Bowel sounds are normal. She exhibits no distension and no mass. There is no tenderness. There is no rebound and no guarding.  Musculoskeletal: Normal range of motion. She exhibits no edema.  Lymphadenopathy:    She has no cervical adenopathy.  Neurological: She is alert and oriented to person, place, and time. She has normal reflexes. No cranial nerve deficit. Gait normal. Coordination normal.  Skin: Skin is warm and dry. No rash noted.  Psychiatric: Mood, memory, affect and judgment normal.  Nursing note and vitals reviewed.   LABORATORY DATA:  I have reviewed the data as listed Lab Results  Component Value Date   WBC 5.0 06/04/2015   HGB 16.2* 06/04/2015   HCT 45.5 06/04/2015   MCV 90.8 06/04/2015   PLT 157 06/04/2015     Chemistry      Component Value Date/Time   NA 141 09/14/2015 1125   K 4.5 09/14/2015 1125   CL 101 09/14/2015 1125   CO2 32 09/14/2015 1125   BUN 20 09/14/2015 1125   CREATININE 0.79  09/14/2015 1125      Component Value Date/Time   CALCIUM 9.5 09/14/2015 1125   ALKPHOS 75 09/14/2015 1125   AST 15 09/14/2015 1125   ALT 14 09/14/2015 1125   BILITOT 1.2 09/14/2015 1125     RADIOLOGY: I have reviewed the above documentation for accuracy and completeness, and I agree with the above.  Study Result     CLINICAL DATA: Right hip pain radiating down to right knee off and on for 4 years. Fell down steps 4 years ago.  EXAM: DG HIP (WITH OR WITHOUT PELVIS) 2-3V RIGHT  COMPARISON: None.  FINDINGS: Early symmetric degenerative changes in the hips bilaterally with slight joint space narrowing and early spurring. No acute bony abnormality. Specifically, no fracture, subluxation, or dislocation. Soft tissues are intact.  IMPRESSION: No acute bony abnormality.   Electronically Signed  By: Rolm Baptise M.D.  On: 09/07/2015 15:33   Study Result     CLINICAL DATA: 65 year old female with left breast cancer post lumpectomy 08/20/2014.  EXAM: DIGITAL DIAGNOSTIC LEFT MAMMOGRAM WITH 3D TOMOSYNTHESIS AND CAD  LEFT BREAST ULTRASOUND  COMPARISON: Previous exam(s).  ACR Breast Density Category b: There are scattered areas of fibroglandular density.  FINDINGS: No suspicious masses or calcifications are seen in the right breast. Postsurgical changes are present in the upper slightly outer left breast related to interval lumpectomy. A spot compression magnification CC view of the lumpectomy site in the left breast was performed. There is no mammographic evidence of recurrence at the lumpectomy site. There is an oval mixed density mass within the upper left breast anterior depth near the skin surface.  Mammographic images were processed with CAD.  Physical examination of the left breast reveals skin discoloration and thickening/changes related to prior radiation therapy. A surgical scar is present in the upper-outer left breast. The  patient reports the left breast to be diffusely tender.  Targeted ultrasound of the left breast was performed demonstrating postsurgical changes with scar and fluid as well as subcutaneous edema at 1-2 o'clock 5 cm from the nipple measuring up to 3.7 x 1.1 x 4.1 cm. This is directly subjacent to the surgical site and corresponds well with the finding seen on mammography.  IMPRESSION: No mammographic evidence of malignancy in either breast, with postsurgical/ post treatment changes seen in the left breast sonographically.  RECOMMENDATION: Bilateral diagnostic mammography in 12 months.  I have discussed the findings and recommendations with the patient. Results were also provided in writing at the conclusion of the visit. If applicable, a reminder letter will be sent to the patient regarding the next appointment.  BI-RADS CATEGORY 2: Benign.   Electronically Signed  By: Everlean Alstrom M.D.  On: 08/25/2015 10:44   ASSESSMENT & PLAN:  Stage II, infiltrating ductal carcinoma of the left breast, ER positive, PR positive, HER-2 negative Anxiety Tobacco abuse Insomnia, difficulty falling asleep Constipation DEXA  07/01/2015 with osteopenia HTN Leg/Back pain  She has had her mammogram. We again reviewed the significant benefit of endocrine therapy in ER+ breast cancer.  We discussed other alternatives to treatment including Tamoxifen.  She still has her uterus/ovaries. We discussed the risks and benefits of tamoxifen. These include but not limited to insomnia, hot flashes, mood changes, vaginal dryness, and weight gain. Although rare, serious side effects including endometrial cancer, risk of blood clots were also discussed. We strongly believe that the benefits far outweigh the risks. Patient understands these risks and consented to starting treatment. Planned treatment duration is 5 years.  We  have done plain films in regards to her back/leg pain. I have ordered an MRI  since symptoms are worsening.   I have written the patient a prescription for Celebrex to be taken in lieu of current heavy ibuprofen use.   We discussed her BP today.  Ibuprofen may be exacerbating this. We will recheck at follow-up and if still elevated will discuss starting her on an anti-hypertensive. I have encouraged her to get re-established with Dr. Luan Pulling  We discussed her ongoing tobacco use: Ready to quit: No Counseling given: Yes  I will see her again in 6 weeks for follow up.  This note was signed electronically.    This document serves as a record of services personally performed by Ancil Linsey, MD. It was created on her behalf by Arlyce Harman, a trained medical scribe. The creation of this record is based on the scribe's personal observations and the provider's statements to them. This document has been checked and approved by the attending provider.  I have reviewed the above documentation for accuracy and completeness, and I agree with the above.  Kelby Fam. Penland MD

## 2015-12-13 ENCOUNTER — Telehealth: Payer: Self-pay | Admitting: Gastroenterology

## 2015-12-13 NOTE — Telephone Encounter (Signed)
PLEASE CALL PT. SHE HAD 3 SIMPLE ADENOMAS REMOVED. SHE HAS MODERATE gastritis, & duodenitis DUE TO IBUPROFEN.    To CONTROL HER REFLUX:           1. CONTINUE YOUR WEIGHT LOSS EFFORTS. LOSE 10 LBS.      2. DRINK WATER TO KEEP YOUR URINE LIGHT YELLOW. REDUCE DAILY DOSE OF SUNDROP.      3. CUT DOWN ON YOUR SMOKING  AVOID ITEMS THAT TRIGGER GASTRITIS.   FOLLOW A HIGH FIBER/LOW FAT DIET. AVOID ITEMS THAT CAUSE BLOATING.   CONTINUE PROTONIX. TAKE 30 MINUTES PRIOR TO BREAKFAST FOREVER.  Next colonoscopy in 3 years.   FOLLOW UP IN 4 MOS E30 REFLUX, GASTRITIS.

## 2015-12-14 NOTE — Telephone Encounter (Signed)
Called pt and LMOM to call office back  

## 2015-12-14 NOTE — Telephone Encounter (Signed)
Patient's sister Rande Brunt called in wanting test results for her sister, however we don't have a Environmental consultant form on file for our practice.  She voiced understanding and stated she will have her sister call back

## 2015-12-14 NOTE — Telephone Encounter (Signed)
Pt called back and is aware of her results

## 2015-12-14 NOTE — Telephone Encounter (Signed)
APPT MADE AND ON RECALL  °

## 2015-12-18 ENCOUNTER — Ambulatory Visit (HOSPITAL_COMMUNITY): Payer: BLUE CROSS/BLUE SHIELD

## 2015-12-28 ENCOUNTER — Ambulatory Visit (HOSPITAL_COMMUNITY): Payer: BLUE CROSS/BLUE SHIELD

## 2016-01-12 ENCOUNTER — Encounter: Payer: Self-pay | Admitting: Gastroenterology

## 2016-01-18 ENCOUNTER — Encounter (HOSPITAL_COMMUNITY): Payer: BLUE CROSS/BLUE SHIELD | Attending: Hematology & Oncology | Admitting: Oncology

## 2016-01-18 VITALS — BP 144/83 | HR 77 | Temp 98.0°F | Resp 18 | Wt 246.9 lb

## 2016-01-18 DIAGNOSIS — N952 Postmenopausal atrophic vaginitis: Secondary | ICD-10-CM | POA: Diagnosis not present

## 2016-01-18 DIAGNOSIS — Z79811 Long term (current) use of aromatase inhibitors: Secondary | ICD-10-CM

## 2016-01-18 DIAGNOSIS — R21 Rash and other nonspecific skin eruption: Secondary | ICD-10-CM | POA: Insufficient documentation

## 2016-01-18 DIAGNOSIS — C50412 Malignant neoplasm of upper-outer quadrant of left female breast: Secondary | ICD-10-CM | POA: Diagnosis not present

## 2016-01-18 DIAGNOSIS — Z720 Tobacco use: Secondary | ICD-10-CM

## 2016-01-18 MED ORDER — HYDROCORTISONE 0.5 % EX CREA: 1.0000 "application " | TOPICAL_CREAM | Freq: Two times a day (BID) | CUTANEOUS | Status: DC

## 2016-01-18 MED ORDER — ESTRADIOL 0.1 MG/GM VA CREA: TOPICAL_CREAM | VAGINAL | Status: DC

## 2016-01-18 NOTE — Assessment & Plan Note (Addendum)
Stage IIB (T2N1aM0) invasive ductal carcinoma of left breast, ER/PR+, HER2 NEGATIVE, S/P left lumpectomy on 08/20/2014 followed by CMF x 6 cycles (patient refusing Taxane-based therapy) followed by XRT and now on anti-endocrine therapy beginning on 06/04/2015 with Aromasin, switched Arimidex on 08/07/2015, and finally to Tamoxifen on 12/07/2015.  Oncology history is updated.  She reports hot flashes that are manageable.    She notes vaginal dryness and irritation.  This is likely secondary to Tamoxifen and therefore, I will escribed low dose estradiol vaginal cream.  She is to use the lowest dose possible in the maintenance setting that provides symptomatic relief.  She notes that her right hip pain is much improved and therefore did not pursue MRI imaging.  She continues to smoke.  Smoking cessation education is provided.  She continues to struggle with depression. She is to follow-up with her primary care provider about this issue.  She has a rash on her right forearm that is erythematous with thickened skin.  I have prescribed hydrocortisone cream for this rash.  Labs in 2-3 months: CBC diff, CMET.  Return in 2-3 months for follow-up.

## 2016-01-18 NOTE — Patient Instructions (Signed)
[place] at [facility] Discharge Instructions  RECOMMENDATIONS MADE BY THE CONSULTANT AND ANY TEST RESULTS WILL BE SENT TO YOUR REFERRING PHYSICIAN.  Exam done and seen today by Kirby Crigler Hydrocortisone cream and vaginal cream escribed to pharmacy. Labs in 2-3 months. Return to see the Doctor in 2-3 months with Dr.Penland Call the clinic for any concerns or questions.  Thank you for choosing [place] at [facility] to provide your oncology and hematology care.  To afford each patient quality time with our provider, please arrive at least 15 minutes before your scheduled appointment time.   Beginning January 23rd 2017 lab work for the [facility] will be done in the  Main lab at [facility] on 1st floor. If you have a lab appointment with the [facility] please come in thru the  Main Entrance and check in at the main information desk  You need to re-schedule your appointment should you arrive 10 or more minutes late.  We strive to give you quality time with our providers, and arriving late affects you and other patients whose appointments are after yours.  Also, if you no show three or more times for appointments you may be dismissed from the clinic at the providers discretion.     Again, thank you for choosing [facility].  Our hope is that these requests will decrease the amount of time that you wait before being seen by our physicians.       _____________________________________________________________  Should you have questions after your visit to [facility], please contact our office at [phone] between the hours of 8:30 a.m. and 4:30 p.m.  Voicemails left after 4:30 p.m. will not be returned until the following business day.  For prescription refill requests, have your pharmacy contact our office.         Resources For Cancer Patients and their Caregivers ? American Cancer Society: Can assist  with transportation, wigs, general needs, runs Look Good Feel Better.        216-332-4475 ? Cancer Care: Provides financial assistance, online support groups, medication/co-pay assistance.  1-800-813-HOPE (530)077-1808) ? [facility] Assists [place] cancer patients and their families through emotional , educational and financial support.  586 277 8394 ? [place] DSS Where to apply for food stamps, Medicaid and utility assistance. 517-563-9315 ? [facility]: Transportation to medical appointments. (612) 833-0282 ? Social Security Administration: May apply for disability if have a Stage IV cancer. (401)548-7382 909-319-9383 ? [employer], Disability and Transit Services: Assists with nutrition, care and transit needs. [facility] Support Programs: '@10RELATIVEDAYS'$ @ > Cancer Support Group  2nd Tuesday of the month 1pm-2pm, Journey Room  > Creative Journey  3rd Tuesday of the month 1130am-1pm, Journey Room  > Look Good Feel Better  1st Wednesday of the month 10am-12 noon, Journey Room (Call [place] to register [phone])

## 2016-01-18 NOTE — Progress Notes (Signed)
Regina Bogus, MD [place] [address]  Carcinoma of upper-outer quadrant of left female breast ([place])  Rash - Plan: hydrocortisone cream 0.5 %  Vaginal atrophy - Plan: estradiol (ESTRACE VAGINAL) 0.1 MG/GM vaginal cream  CURRENT THERAPY: Tamoxifen  INTERVAL HISTORY: Regina Mcmahon 65 y.o. 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    Carcinoma of upper-outer quadrant of left female breast ([place])   06/24/2014 Mammogram Left breast: 6 x 3 x 2.5 cm area of ill-defined increased density in the UOQ,  corresponding to the mass felt by the patient, not in the same location of the previously biopsied benign calcifications.    06/24/2014 Breast US Left breast: ill-defined predominantly hypoechoic area with some increased echogenicity in the 2 o'clock position of the left breast, 7 cm from the nipple. 2.6 x 2.3 x 1.8 cm in maximum dimensions.   07/01/2014 Initial Biopsy Left breast core needle bx: Invasive mammary carcinoma with lobular features, ER+ (100%), PR+ (91%), HER2/neu negative (ratio 1.09), Ki-67 32%. E-cadherin strongly positive, diagnostic for IDC    07/01/2014 Clinical Stage Stage IIA: T2 N0   08/20/2014 Definitive Surgery Left breast seed localized lumpectomy/SLNB: IDC, +LVI, DCIS, 1 LN removed and positive for metastatic carcinoma. Grade 2. HER2/neu repeated and negative (ratio 0.69-1.9).    08/20/2014 Pathologic Stage Stage IIB: pT2 pN1a pMx   09/17/2014 Surgery Port-a cath placement and re-excision   10/02/2014 - 01/15/2015 Chemotherapy CMF x 6 cycles.  patient refused Taxane-containing chemotherapy.   03/07/2015  Procedure Comp Cancer Panel reveals no variant at APC, ATM, AXIN2, BARD1, BMPR1A, BRCA1, BRCA2, BRIP1, CDH1, CDK4, CDKN2A, CHEK2, FANCC, MLH1, MSH2, MSH6, MUTYH, NBN, PALB2, PMS2, POLD1, POLE, PTEN, RAD51C, RAD51D, SMAD4, STK11, TP53, VHL, and XRCC2.   04/06/2015 - 05/21/2015 Radiation Therapy Adjuvant RT Pablo Ledger): Left breast/ 45 Gy over 25 fractions.  Left supraclavicular fossa and axilla/ 45 Gy over 25 fractions. Left breast boost/ 16 Gy over 8 fractions. Total dose: 61 Gy   06/04/2015 - 08/07/2015 Anti-estrogen oral therapy Exemestane 25 mg daily.  Planned duration of therapy 5 years.    07/23/2015 Survivorship Survivorship care plan completed and mailed to patient in lieu of in person visit   08/06/2015 Adverse Reaction Severe hot flashes and mood swings   08/08/2015 - 12/07/2015 Anti-estrogen oral therapy Arimidex   12/04/2015 Adverse Reaction Worsening depression, patient stopped Arimidex   12/07/2015 -  Anti-estrogen oral therapy Tamoxifen    She has a few complaints, BUT reports that Tamoxifen is much more tolerable. 1. Vaginal dryness and irritation.  She is provided education regarding Tamoxifen-induced vaginal atrophy.  She is educated on treatment options including vaginal moisturizers and lubricants.  Ultimately, she is agreeable to pursue estrogen vaginal cream. 2. Right lower arm rash.  She denies any new changes with regards to detergents, soaps, etc.  She notes that it is pruritic.  She denies a rash anywhere else on her body 3. Continued issues with depression and mood swings.  Review of Systems  Constitutional: Negative.   HENT: Negative.   Eyes: Negative.   Respiratory: Positive for wheezing (Chronic).   Cardiovascular: Negative.   Gastrointestinal: Negative.   Genitourinary: Negative.  Vaginal dryness and irritation  Musculoskeletal: Negative.   Skin: Positive for rash (Right forearm).  Neurological: Negative.   Endo/Heme/Allergies: Negative.     Psychiatric/Behavioral: Positive for depression.    Past Medical History  Diagnosis Date  . Hypertension   . Panic attacks   . Chronic airway obstruction ([place])   . Pain     Hx.pain in joint involving pelvic region and thigh; pain in limb  . Sinusitis   . Asthma   . Palpitations   . Hemorrhage rectum     and anus.  . Symptomatic menopausal or female climacteric states   . Asymptomatic varicose veins   . Other symptoms involving digestive system(787.99)   . Depression   . Breast cancer ([place]) 06/2014    left  . Insomnia   . COPD (chronic obstructive pulmonary disease) [facility])     Past Surgical History  Procedure Laterality Date  . Cesarean section    . Cholecystectomy  1985  . Tubal ligation    . Colonoscopy  2002    Dr. Lindalou Hose: internal hemorrhoids, one small rectal polyp, adenomatous path   . Radioactive seed guided mastectomy with axillary sentinel lymph node biopsy Left 08/20/2014    Procedure: LEFT BREAST LUMPECTOMY WITH RADIOACTIVE SEED LOCALIZATION AND SENTINEL LYMPH NODE MAPPING;  Surgeon: Erroll Luna, MD;  Location: [place];  Service: General;  Laterality: Left;  . Portacath placement Right 09/17/2014    Procedure: INSERTION PORT-A-CATH WITH ULTRASOUND;  Surgeon: Erroll Luna, MD;  Location: [place];  Service: General;  Laterality: Right;  IJ  . Re-excision of breast lumpectomy Left 09/17/2014    Procedure: RE-EXCISION OF BREAST LUMPECTOMY;  Surgeon: Erroll Luna, MD;  Location: [place];  Service: General;  Laterality: Left;  . Port-a-cath removal  02/2015  . Colonoscopy N/A 11/23/2015    Procedure: COLONOSCOPY;  Surgeon: Danie Binder, MD;  [facility];  Service: Endoscopy;  Laterality: N/A;  1100 - moved to 10:45 - office to notify  . Esophagogastroduodenoscopy N/A 11/23/2015    Procedure: ESOPHAGOGASTRODUODENOSCOPY (EGD);  Surgeon: Danie Binder, MD;  [facility];  Service: Endoscopy;   Laterality: N/A;    Family History  Problem Relation Age of Onset  . Diabetes Mother   . Ovarian cancer Mother 27  . Other Father     died in MVA at age 55  . Other Sister     mitral valve disorder  . Melanoma Sister 54    removed from back of leg  . Colon cancer Brother     early 41s, succumbed to disease  . Cancer Paternal Aunt     leukemia, bone, or lung cancer  . Alzheimer's disease Maternal Grandmother   . Heart attack Maternal Grandfather   . Heart attack Paternal Grandmother   . COPD Paternal Grandfather   . Emphysema Paternal Grandfather   . Congestive Heart Failure Paternal Aunt   . Stomach cancer Paternal Uncle   . Kidney cancer Maternal Uncle   . Cancer Cousin     dx. teens/dx. 40s  . Cancer Cousin   . Colon cancer Cousin     Social History   Social History  . Marital Status: Divorced    Spouse Name: N/A  . Number of Children: 2  . Years of Education: N/A   Social History Main Topics  . Smoking status: Current Every Day Smoker -- 1.50 packs/day    Types: Cigarettes  . Smokeless tobacco: Never  Used  . Alcohol Use: No  . Drug Use: No  . Sexual Activity: No   Other Topics Concern  . Not on file   Social History Narrative     PHYSICAL EXAMINATION  ECOG PERFORMANCE STATUS: 1 - Symptomatic but completely ambulatory  Filed Vitals:   01/18/16 1418  BP: 144/83  Pulse: 77  Temp: 98 F (36.7 C)  Resp: 18    GENERAL:alert, no distress, well nourished, well developed, comfortable, cooperative, obese, smiling and unaccompanied. SKIN: skin color, texture, turgor are normal, no rashes or significant lesions HEAD: Normocephalic, No masses, lesions, tenderness or abnormalities EYES: normal, EOMI, Conjunctiva are pink and non-injected EARS: External ears normal OROPHARYNX:lips, buccal mucosa, and tongue normal and mucous membranes are moist  NECK: supple, trachea midline LYMPH:  not examined BREAST:not examined LUNGS: positive findings: rhonchi   right upper posterior, right mid posterior, left upper posterior and left mid posterior, wheezing  diffusely HEART: regular rate & rhythm, no murmurs and no gallops ABDOMEN:abdomen soft BACK: Back symmetric, no curvature. EXTREMITIES:less then 2 second capillary refill, no joint deformities, effusion, or inflammation, no skin discoloration, no cyanosis  NEURO: alert & oriented x 3 with fluent speech, no focal motor/sensory deficits, gait normal   LABORATORY DATA: CBC    Component Value Date/Time   WBC 5.0 06/04/2015 1059   RBC 5.01 06/04/2015 1059   HGB 16.2* 06/04/2015 1059   HCT 45.5 06/04/2015 1059   PLT 157 06/04/2015 1059   MCV 90.8 06/04/2015 1059   MCH 32.3 06/04/2015 1059   MCHC 35.6 06/04/2015 1059   RDW 13.2 06/04/2015 1059   LYMPHSABS 0.8 06/04/2015 1059   MONOABS 0.3 06/04/2015 1059   EOSABS 0.2 06/04/2015 1059   BASOSABS 0.0 06/04/2015 1059      Chemistry      Component Value Date/Time   NA 141 09/14/2015 1125   K 4.5 09/14/2015 1125   CL 101 09/14/2015 1125   CO2 32 09/14/2015 1125   BUN 20 09/14/2015 1125   CREATININE 0.79 09/14/2015 1125      Component Value Date/Time   CALCIUM 9.5 09/14/2015 1125   ALKPHOS 75 09/14/2015 1125   AST 15 09/14/2015 1125   ALT 14 09/14/2015 1125   BILITOT 1.2 09/14/2015 1125        PENDING LABS:   RADIOGRAPHIC STUDIES:  No results found.   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  Oncology history is updated.  She reports hot flashes that are manageable.    She notes vaginal dryness and irritation.  This is likely  secondary to Tamoxifen and therefore, I will escribed low dose estradiol vaginal cream.  She is to use the lowest dose possible in the maintenance setting that provides symptomatic relief.  She notes that her right hip pain is much improved and therefore did not pursue MRI imaging.  She continues to smoke.  Smoking cessation education is provided.  She has a rash on her right forearm that is erythematous with thickened skin.  I have prescribed hydrocortisone cream for this rash.  Labs in 2-3 months: CBC diff, CMET.  Return in 2-3 months for follow-up.  ORDERS PLACED FOR THIS ENCOUNTER: No orders of the defined types were placed in this encounter.    MEDICATIONS PRESCRIBED THIS ENCOUNTER: Meds ordered this encounter  Medications  . hydrocortisone cream 0.5 %    Sig: Apply 1 application topically 2 (two) times daily.    Dispense:  30 g    Refill:  0    Order Specific Question:  Supervising Provider    Answer:  Patrici Ranks [id number]  . estradiol (ESTRACE VAGINAL) 0.1 MG/GM vaginal cream    Sig: 2 gram vaginally daily x 2 weeks, then 1 gram daily x 1 week, then 1 gram three times per week    Dispense:  42.5 g    Refill:  6    Order Specific Question:  Supervising Provider    Answer:  Patrici Ranks [id number]    THERAPY PLAN:  NCCN guidelines recommends the following surveillance for invasive breast cancer (2.2017):  A. History and Physical exam 1-4 times per year as clinically appropriate for 5 years, then annually.  B. Periodic screening for changes in family history and referral to genetics counseling as indicated  C. Educate, monitor, and refer to lymphedema management.  D. Mammography every 12 months  E. Routine imaging of reconstructed breast is not indicated.  F. In the absence of clinical signs and symptoms suggestive of recurrent disease, there is no indication for laboratory or imaging studies for metastases screening.  G. Women on Tamoxifen: annual  gynecologic assessment every 12 months if uterus is present.  H. Women on aromatase inhibitor or who experience ovarian failure secondary to treatment should have monitoring of bone health with a bone mineral density determination at baseline and periodically thereafter.  I. Assess and encourage adherence to adjuvant endocrine therapy.  J. Evidence suggests that active lifestyle, healthy diet, limited alcohol intake, and achieving and maintaining an ideal body weight (20-25 BMI) may lead to optimal breast cancer outcomes.  NCCN guidelines recommends monitoring for the following for those patients on Tamoxifen/Raloxifene Therapy:  A. Asymptomatic   1. Continue risk-reduction agent   2. Continue follow-up  B. Hot-flashes or other risk-reduction, agent related symptoms   1. Symptomatic treatment.    2. If persists, re-evaluate role of risk-reduction agent   3. Continue risk-reduction agent- Continue follow-up  C. Abnormal vaginal bleeding   1. Prompt evaluation for endometrial cancer if uterus intact    A. If endometrial pathology found, re-initiation of Tamoxifen may be considered after hysterectomy if early-stage disease     1. Continue follow-up    B. If no endometrial pathology (carcinoma or hyperplasia with or without atypia) found, continue Tamoxifen and re-evaluate if symptoms persist or recur.     1. Continue follow-up  D. Anticipated elective surgery   1. Consider discontinuing Tamoxifen or Raloxifene prior to elective surgery   2. Resume Tamoxifen or Raloxifene postoperatively when ambulation is normal  E. Deep vein thrombosis, pulmonary embolism, cerebrovascular accident, or prolonged immobilization   1. Discontinue tamoxifen or raloxifene, treat underlying condition.   All questions were answered. The patient knows to call the clinic with any problems, questions or concerns. We can certainly see the patient much sooner if necessary.  Patient and plan discussed with Dr. Ancil Linsey and she is in agreement with the aforementioned.   This note is electronically signed by: Doy Mince 01/18/2016 5:04 PM

## 2016-02-15 ENCOUNTER — Telehealth (HOSPITAL_COMMUNITY): Payer: Self-pay

## 2016-02-15 NOTE — Telephone Encounter (Signed)
Called patient per in basket request about her dizziness. She states she has been dizzy since 7/23 pm. She went shopping and ate in [place] 7/23 and when she got home she was dizzy, nauseated and had a headache. She took tylenol and nausea med and felt better but remained dizzy throughout the night. At this time she feels fine. She is not dizzy. Reviewed with PA-C. Instructed patient to follow up with PCP. Her Tamoxifen should not be causing her dizziness. Also instructed her she could always go to ER if needed. She verbalized understanding.

## 2016-02-15 NOTE — Telephone Encounter (Signed)
-----   Message from Louis Meckel sent at 02/15/2016 11:29 AM EDT ----- Regarding: patient needs call Dizzy Patient called and is dizzy and would like to talk to a nurse.  Thanks

## 2016-02-24 ENCOUNTER — Ambulatory Visit: Payer: BLUE CROSS/BLUE SHIELD | Admitting: Gastroenterology

## 2016-03-03 ENCOUNTER — Ambulatory Visit (INDEPENDENT_AMBULATORY_CARE_PROVIDER_SITE_OTHER): Payer: BLUE CROSS/BLUE SHIELD | Admitting: Gastroenterology

## 2016-03-03 ENCOUNTER — Encounter: Payer: Self-pay | Admitting: Gastroenterology

## 2016-03-03 VITALS — BP 157/87 | HR 67 | Temp 98.0°F | Ht 64.0 in | Wt 252.2 lb

## 2016-03-03 DIAGNOSIS — K921 Melena: Secondary | ICD-10-CM | POA: Diagnosis not present

## 2016-03-03 DIAGNOSIS — D126 Benign neoplasm of colon, unspecified: Secondary | ICD-10-CM | POA: Diagnosis not present

## 2016-03-03 DIAGNOSIS — K219 Gastro-esophageal reflux disease without esophagitis: Secondary | ICD-10-CM

## 2016-03-03 MED ORDER — PANTOPRAZOLE SODIUM 40 MG PO TBEC: 40.0000 mg | DELAYED_RELEASE_TABLET | Freq: Every day | ORAL | 3 refills | Status: DC

## 2016-03-03 NOTE — Progress Notes (Signed)
ON RECALL  °

## 2016-03-03 NOTE — Assessment & Plan Note (Signed)
THREE SIMPLE ADENOMAS REMOVED MAY 2017.  NEXT COLONOSCOPY IN MAY 2020 WITH PROPOFOL FOR SEDATION

## 2016-03-03 NOTE — Progress Notes (Signed)
cc'ed to pcp °

## 2016-03-03 NOTE — Patient Instructions (Signed)
CONTINUE YOUR WEIGHT LOSS EFFORTS.  DRINK WATER TO KEEP YOUR URINE LIGHT YELLOW.  Chew SUGAR FREE gum to keep from feeling hungry and eating.  FOLLOW A LOW FAT DIET. MEATS SHOULD BE BAKED, BROILED, OR BOILED. AVOID FRIED FOODS.   CONTINUE PROTONIX. TAKE 30 MINUTES PRIOR TO BREAKFAST.  CHEW TUMS IF NEEDED.  FOLLOW UP IN 6 MOS.   NEXT COLONOSCOPY IN MAY 2020 WITH PROPOFOL FOR SEDATION.

## 2016-03-03 NOTE — Progress Notes (Signed)
Subjective:    Patient ID: Regina Mcmahon, female    DOB: 09-02-1950, 65 y.o.   MRN: [id number]  Alonza Bogus, MD  HPI REFLUX IS BETTER. TAKING PPI ONCE A DAY. USING PRN DEPENDS ON WHAT SHE EATS. STOPPED SMOKING JUL 12. WEIGHT UP 246 DEC 2016 --> 252 LBS. BMs: OCCASIONAL CONSTIPATION. MAY SEE BLOOD WHEN CONSTIPATED(SMALL AMOUNT-DRIPS IN BOWL/WHEN SHE WIPES, NONE SINCE JUN 2017.). TAKES 2 BOMBS-STOOL SOFTENER. DIARRHEA IF SHE GETS NERVOUS. PROBLEMS WITH SEDATION-REMEMBERED PAIN WITH COLONOSCOPY.  PT DENIES FEVER, CHILLS, HEMATEMESIS, nausea, vomiting, melena, diarrhea, CHEST PAIN, SHORTNESS OF BREATH,  CHANGE IN BOWEL IN HABITS, abdominal pain, problems swallowing, OR heartburn or indigestion.   Past Medical History:  Diagnosis Date  . Asthma   . Asymptomatic varicose veins   . Breast cancer ([place]) 06/2014   left  . Chronic airway obstruction ([place])   . COPD (chronic obstructive pulmonary disease) ([place])   . Depression   . Hemorrhage rectum    and anus.  . Hypertension   . Insomnia   . Other symptoms involving digestive system(787.99)   . Pain    Hx.pain in joint involving pelvic region and thigh; pain in limb  . Palpitations   . Panic attacks   . Sinusitis   . Symptomatic menopausal or female climacteric states     Past Surgical History:  Procedure Laterality Date  . CESAREAN SECTION    . CHOLECYSTECTOMY  1985  . COLONOSCOPY  2002   Dr. Lindalou Hose: internal hemorrhoids, one small rectal polyp, adenomatous path   . COLONOSCOPY N/A 11/23/2015   Procedure: COLONOSCOPY;  Surgeon: Danie Binder, MD;  [facility];  Service: Endoscopy;  Laterality: N/A;  1100 - moved to 10:45 - office to notify  . ESOPHAGOGASTRODUODENOSCOPY N/A 11/23/2015   Procedure: ESOPHAGOGASTRODUODENOSCOPY (EGD);  Surgeon: Danie Binder, MD;  [facility];  Service: Endoscopy;  Laterality: N/A;  . PORT-A-CATH REMOVAL  02/2015  . PORTACATH PLACEMENT Right 09/17/2014   Procedure:  INSERTION PORT-A-CATH WITH ULTRASOUND;  Surgeon: Erroll Luna, MD;  Location: [place];  Service: General;  Laterality: Right;  IJ  . RADIOACTIVE SEED GUIDED MASTECTOMY WITH AXILLARY SENTINEL LYMPH NODE BIOPSY Left 08/20/2014   Procedure: LEFT BREAST LUMPECTOMY WITH RADIOACTIVE SEED LOCALIZATION AND SENTINEL LYMPH NODE MAPPING;  Surgeon: Erroll Luna, MD;  Location: [place];  Service: General;  Laterality: Left;  . RE-EXCISION OF BREAST LUMPECTOMY Left 09/17/2014   Procedure: RE-EXCISION OF BREAST LUMPECTOMY;  Surgeon: Erroll Luna, MD;  Location: [place];  Service: General;  Laterality: Left;  . TUBAL LIGATION      No Known Allergies  Current Outpatient Prescriptions  Medication Sig Dispense Refill  . TYLENOL) 325 MG tablet Take 650 mg by q6h prn    . albuterolMCG/ACT inhaler Inhale 2 puffs q6h prn wheezing or shortness of breath.    . ALPRAZolam (XANAX) 0.5 MG tablet Take 0.5 mg by mouth 3 (three) times daily as needed for anxiety.    . calcium carbonate tablet Chew 1 tablet by mouth 3 (three) times daily with meals.     .      . CELEBREX 200 MG capsule Take 1 capsule (200 mg total) by mouth 2 (two) times daily.    Marland Kitchen COLACE) 100 MG capsule Take 100 mg by tid prn as needed for mild constipation.     Marland Kitchen ESTRACE VAGINAL 1 gram three times per week    . hydrochlorothiazide  12.5 MG  Take 12.5 mg by mouth daily. Reported on 12/07/2015    . hydrocortisone cream 0.5 % Apply 1 application topically 2 (two) times daily.    Marland Kitchen PROTONIX 40 MG tablet Take 1 tablet (40 mg total) by mouth daily.    . tamoxifen (NOLVADEX) 20 MG tablet Take 1 tablet (20 mg total) by mouth daily.      Review of Systems PER HPI OTHERWISE ALL SYSTEMS ARE NEGATIVE.    Objective:   Physical Exam  Constitutional: She is oriented to person, place, and time. She appears well-developed and well-nourished. No distress.  HENT:  Head: Normocephalic and atraumatic.    Mouth/Throat: Oropharynx is clear and moist. No oropharyngeal exudate.  Eyes: Pupils are equal, round, and reactive to light. No scleral icterus.  Neck: Normal range of motion. Neck supple.  Cardiovascular: Normal rate, regular rhythm and normal heart sounds.   Pulmonary/Chest: Effort normal and breath sounds normal. No respiratory distress.  Abdominal: Soft. Bowel sounds are normal. She exhibits no distension. There is no tenderness.  Musculoskeletal: She exhibits no edema.  Lymphadenopathy:    She has no cervical adenopathy.  Neurological: She is alert and oriented to person, place, and time.  Psychiatric: She has a normal mood and affect.  Vitals reviewed.     Assessment & Plan:

## 2016-03-03 NOTE — Assessment & Plan Note (Addendum)
SYMPTOMS FAIRLY WELL CONTROLLED. GAINED WEIGHT AND BMI > 40.  CONTINUE WEIGHT LOSS EFFORTS. DRINK WATER TO KEEP YOUR URINE LIGHT YELLOW. Chew SUGAR FREE gum to keep from feeling hungry and eating. FOLLOW A LOW FAT DIET. MEATS SHOULD BE BAKED, BROILED, OR BOILED. AVOID FRIED FOODS.  CONTINUE PROTONIX. TAKE 30 MINUTES PRIOR TO BREAKFAST. CHEW TUMS IF NEEDED. FOLLOW UP IN 6 MOS.

## 2016-03-03 NOTE — Assessment & Plan Note (Signed)
SYMPTOMS CONTROLLED/RESOLVED.  CONTINUE TO MONITOR SYMPTOMS. 

## 2016-03-03 NOTE — Assessment & Plan Note (Signed)
GAINING WEIGHT BUT ENROLLED AT [facility].  CONTINUE WEIGHT LOSS EFFORT. CHEW SUGAR FREE GUM. LOW FAT DIET FOLLOW UP IN 6 MOS.

## 2016-03-11 ENCOUNTER — Other Ambulatory Visit (HOSPITAL_COMMUNITY): Payer: Self-pay

## 2016-03-11 DIAGNOSIS — C50412 Malignant neoplasm of upper-outer quadrant of left female breast: Secondary | ICD-10-CM

## 2016-03-14 ENCOUNTER — Ambulatory Visit (HOSPITAL_COMMUNITY): Payer: Medicaid Other

## 2016-03-14 ENCOUNTER — Other Ambulatory Visit (HOSPITAL_COMMUNITY): Payer: Medicaid Other

## 2016-03-15 ENCOUNTER — Ambulatory Visit (HOSPITAL_COMMUNITY): Payer: BLUE CROSS/BLUE SHIELD

## 2016-03-15 ENCOUNTER — Encounter (HOSPITAL_COMMUNITY): Payer: BLUE CROSS/BLUE SHIELD | Attending: Hematology & Oncology

## 2016-03-15 DIAGNOSIS — N952 Postmenopausal atrophic vaginitis: Secondary | ICD-10-CM | POA: Diagnosis present

## 2016-03-15 DIAGNOSIS — R21 Rash and other nonspecific skin eruption: Secondary | ICD-10-CM | POA: Insufficient documentation

## 2016-03-15 DIAGNOSIS — C50412 Malignant neoplasm of upper-outer quadrant of left female breast: Secondary | ICD-10-CM

## 2016-03-15 DIAGNOSIS — Z79899 Other long term (current) drug therapy: Secondary | ICD-10-CM

## 2016-03-15 LAB — CBC WITH DIFFERENTIAL/PLATELET
Basophils Absolute: 0 10*3/uL (ref 0.0–0.1)
Basophils Relative: 0 %
Eosinophils Absolute: 0.3 10*3/uL (ref 0.0–0.7)
Eosinophils Relative: 4 %
HCT: 45.4 % (ref 36.0–46.0)
Hemoglobin: 15.6 g/dL — ABNORMAL HIGH (ref 12.0–15.0)
Lymphocytes Relative: 20 %
Lymphs Abs: 1.3 10*3/uL (ref 0.7–4.0)
MCH: 31 pg (ref 26.0–34.0)
MCHC: 34.4 g/dL (ref 30.0–36.0)
MCV: 90.3 fL (ref 78.0–100.0)
Monocytes Absolute: 0.3 10*3/uL (ref 0.1–1.0)
Monocytes Relative: 4 %
Neutro Abs: 4.7 10*3/uL (ref 1.7–7.7)
Neutrophils Relative %: 72 %
Platelets: 179 10*3/uL (ref 150–400)
RBC: 5.03 MIL/uL (ref 3.87–5.11)
RDW: 12.4 % (ref 11.5–15.5)
WBC: 6.6 10*3/uL (ref 4.0–10.5)

## 2016-03-15 LAB — COMPREHENSIVE METABOLIC PANEL
ALT: 20 U/L (ref 14–54)
AST: 21 U/L (ref 15–41)
Albumin: 3.8 g/dL (ref 3.5–5.0)
Alkaline Phosphatase: 48 U/L (ref 38–126)
Anion gap: 5 (ref 5–15)
BUN: 20 mg/dL (ref 6–20)
CO2: 30 mmol/L (ref 22–32)
Calcium: 8.9 mg/dL (ref 8.9–10.3)
Chloride: 103 mmol/L (ref 101–111)
Creatinine, Ser: 0.88 mg/dL (ref 0.44–1.00)
GFR calc Af Amer: >60 mL/min (ref >60)
GFR calc non Af Amer: >60 mL/min (ref >60)
Glucose, Bld: 149 mg/dL — ABNORMAL HIGH (ref 65–99)
Potassium: 4.8 mmol/L (ref 3.5–5.1)
Sodium: 138 mmol/L (ref 135–145)
Total Bilirubin: 1.7 mg/dL — ABNORMAL HIGH (ref 0.3–1.2)
Total Protein: 7 g/dL (ref 6.5–8.1)

## 2016-03-16 LAB — VITAMIN D 25 HYDROXY (VIT D DEFICIENCY, FRACTURES): Vit D, 25-Hydroxy: 21.4 ng/mL — ABNORMAL LOW (ref 30.0–100.0)

## 2016-03-17 ENCOUNTER — Other Ambulatory Visit (HOSPITAL_COMMUNITY): Payer: Self-pay | Admitting: Hematology & Oncology

## 2016-03-17 DIAGNOSIS — C50412 Malignant neoplasm of upper-outer quadrant of left female breast: Secondary | ICD-10-CM

## 2016-03-17 DIAGNOSIS — M5441 Lumbago with sciatica, right side: Secondary | ICD-10-CM

## 2016-03-31 ENCOUNTER — Encounter (HOSPITAL_COMMUNITY): Payer: BLUE CROSS/BLUE SHIELD | Attending: Hematology & Oncology | Admitting: Hematology & Oncology

## 2016-03-31 ENCOUNTER — Encounter (HOSPITAL_COMMUNITY): Payer: Self-pay | Admitting: Hematology & Oncology

## 2016-03-31 DIAGNOSIS — Z170 Estrogen receptor positive status [ER+]: Secondary | ICD-10-CM

## 2016-03-31 DIAGNOSIS — R21 Rash and other nonspecific skin eruption: Secondary | ICD-10-CM | POA: Insufficient documentation

## 2016-03-31 DIAGNOSIS — Z79810 Long term (current) use of selective estrogen receptor modulators (SERMs): Secondary | ICD-10-CM

## 2016-03-31 DIAGNOSIS — N952 Postmenopausal atrophic vaginitis: Secondary | ICD-10-CM | POA: Insufficient documentation

## 2016-03-31 DIAGNOSIS — Z79899 Other long term (current) drug therapy: Secondary | ICD-10-CM

## 2016-03-31 DIAGNOSIS — C50412 Malignant neoplasm of upper-outer quadrant of left female breast: Secondary | ICD-10-CM | POA: Diagnosis not present

## 2016-03-31 MED ORDER — NALTREXONE-BUPROPION HCL ER 8-90 MG PO TB12: ORAL_TABLET | ORAL | 0 refills | Status: DC

## 2016-03-31 NOTE — Patient Instructions (Addendum)
[place] at [facility] Discharge Instructions  RECOMMENDATIONS MADE BY THE CONSULTANT AND ANY TEST RESULTS WILL BE SENT TO YOUR REFERRING PHYSICIAN.  You saw Dr. Whitney Muse today. Follow up in 4 months with labs. We are referring you to Dr. Glo Herring since you are on tamoxifen and have your ovaries and uterus.  Thank you for choosing [place] at [facility] to provide your oncology and hematology care.  To afford each patient quality time with our provider, please arrive at least 15 minutes before your scheduled appointment time.   Beginning January 23rd 2017 lab work for the [facility] will be done in the  Main lab at [facility] on 1st floor. If you have a lab appointment with the [facility] please come in thru the  Main Entrance and check in at the main information desk  You need to re-schedule your appointment should you arrive 10 or more minutes late.  We strive to give you quality time with our providers, and arriving late affects you and other patients whose appointments are after yours.  Also, if you no show three or more times for appointments you may be dismissed from the clinic at the providers discretion.     Again, thank you for choosing [facility].  Our hope is that these requests will decrease the amount of time that you wait before being seen by our physicians.       _____________________________________________________________  Should you have questions after your visit to [facility], please contact our office at [phone] between the hours of 8:30 a.m. and 4:30 p.m.  Voicemails left after 4:30 p.m. will not be returned until the following business day.  For prescription refill requests, have your pharmacy contact our office.         Resources For Cancer Patients and their Caregivers ? American Cancer Society: Can assist with transportation, wigs, general needs, runs Look Good Feel  Better.        (954) 647-8233 ? Cancer Care: Provides financial assistance, online support groups, medication/co-pay assistance.  1-800-813-HOPE (707)215-2586) ? [facility] Assists [place] cancer patients and their families through emotional , educational and financial support.  (775)585-1101 ? [place] DSS Where to apply for food stamps, Medicaid and utility assistance. 331-760-9704 ? [facility]: Transportation to medical appointments. (587)395-2620 ? Social Security Administration: May apply for disability if have a Stage IV cancer. 631-233-3630 864-819-9742 ? [employer], Disability and Transit Services: Assists with nutrition, care and transit needs. [place] Support Programs: '@10RELATIVEDAYS'$ @ > Cancer Support Group  2nd Tuesday of the month 1pm-2pm, Journey Room  > Creative Journey  3rd Tuesday of the month 1130am-1pm, Journey Room  > Look Good Feel Better  1st Wednesday of the month 10am-12 noon, Journey Room (Call [place] to register [phone])

## 2016-03-31 NOTE — Progress Notes (Signed)
[facility] Progress Note  Patient Care Team: Sinda Du, MD as PCP - General (Internal Medicine) Patrici Ranks, MD as Consulting Physician (Hematology and Oncology) Erroll Luna, MD as Consulting Physician (General Surgery) Thea Silversmith, MD as Consulting Physician (Radiation Oncology) Sylvan Cheese, NP as Nurse Practitioner (Hematology and Oncology) Danie Binder, MD as Consulting Physician (Gastroenterology)  CHIEF COMPLAINTS/PURPOSE OF CONSULTATION:  L breast cancer History of L breast core biopsy for microcalcifications in 2014, fibroadenoma. CMF cycle 1 on 10/02/2014 No matching staging information was found for the patient.  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       06/24/2014 Breast US    Left breast: ill-defined predominantly hypoechoic area with some increased echogenicity in the 2 o'clock position of the left breast, 7 cm from the nipple. 2.6 x 2.3 x 1.8 cm in maximum dimensions.      07/01/2014 Initial Biopsy    Left breast core needle bx: Invasive mammary carcinoma with lobular features, ER+ (100%), PR+ (91%), HER2/neu negative (ratio 1.09), Ki-67 32%. E-cadherin strongly positive, diagnostic for IDC       07/01/2014 Clinical Stage    Stage IIA: T2 N0      08/20/2014 Definitive Surgery    Left breast seed localized lumpectomy/SLNB: IDC, +LVI, DCIS, 1 LN removed and positive for metastatic carcinoma. Grade 2. HER2/neu repeated and negative (ratio 0.69-1.9).       08/20/2014 Pathologic Stage    Stage IIB: pT2  pN1a pMx      09/17/2014 Surgery    Port-a cath placement and re-excision      10/02/2014 - 01/15/2015 Chemotherapy    CMF x 6 cycles.  patient refused Taxane-containing chemotherapy.      03/07/2015 Procedure    Comp Cancer Panel reveals no variant at APC, ATM, AXIN2, BARD1, BMPR1A, BRCA1, BRCA2, BRIP1, CDH1, CDK4, CDKN2A, CHEK2, FANCC, MLH1, MSH2, MSH6, MUTYH, NBN, PALB2, PMS2, POLD1, POLE, PTEN, RAD51C, RAD51D, SMAD4, STK11, TP53, VHL, and XRCC2.      04/06/2015 - 05/21/2015 Radiation Therapy    Adjuvant RT Pablo Ledger): Left breast/ 45 Gy over 25 fractions.  Left supraclavicular fossa and axilla/ 45 Gy over 25 fractions. Left breast boost/ 16 Gy over 8 fractions. Total dose: 61 Gy      06/04/2015 - 08/07/2015 Anti-estrogen oral therapy    Exemestane 25 mg daily.  Planned duration of therapy 5 years.       07/23/2015 Survivorship    Survivorship care plan completed and mailed to patient in lieu of in person visit      08/06/2015 Adverse Reaction    Severe hot flashes and mood swings      08/08/2015 - 12/07/2015 Anti-estrogen oral therapy    Arimidex      12/04/2015 Adverse Reaction    Worsening depression, patient stopped Arimidex      12/07/2015 -  Anti-estrogen oral therapy    Tamoxifen       HISTORY OF PRESENTING ILLNESS:  Bronson Ing 65 y.o. 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   Patient is unaccompanied. She says she quit smoking on July 12th and complains that she has been gaining weight since then. She says she cannot stop eating.   She says that she has a little pain under her left arm where she had lymph node removal. But she denies any other problems. She notes improvement in her breathing with smoking cessation.   Shanise mentions that she is seeing Dr. Luan Pulling for management of a cyst on her left cheek. She is currently on antibiotics and has follow-up with him again. She notes that  he told her if it does not improve he has to send her to a Psychologist, sport and exercise.   She is up to date on her mammogram with last mammogram in January of this year.   MEDICAL HISTORY:  Past Medical History:  Diagnosis Date  . Asthma   . Asymptomatic varicose veins   . Breast cancer ([place]) 06/2014   left  . Chronic airway obstruction ([place])   . COPD (chronic obstructive pulmonary disease) ([place])   . Depression   . Hemorrhage rectum    and anus.  . Hypertension   . Insomnia   . Other symptoms involving digestive system(787.99)   . Pain    Hx.pain in joint involving pelvic region and thigh; pain in limb  . Palpitations   . Panic attacks   . Sinusitis   . Symptomatic menopausal or female climacteric states     SURGICAL HISTORY: Past Surgical History:  Procedure Laterality Date  . CESAREAN SECTION    . CHOLECYSTECTOMY  1985  . COLONOSCOPY  2002   Dr. Lindalou Hose: internal hemorrhoids, one small rectal polyp, adenomatous path   . COLONOSCOPY N/A 11/23/2015   Procedure: COLONOSCOPY;  Surgeon: Danie Binder, MD;  [facility];  Service: Endoscopy;  Laterality: N/A;  1100 - moved to 10:45 - office to notify  . ESOPHAGOGASTRODUODENOSCOPY N/A 11/23/2015   Procedure: ESOPHAGOGASTRODUODENOSCOPY (EGD);  Surgeon: Danie Binder, MD;  [facility];  Service: Endoscopy;  Laterality: N/A;  . PORT-A-CATH REMOVAL  02/2015  . PORTACATH PLACEMENT Right 09/17/2014   Procedure: INSERTION PORT-A-CATH WITH ULTRASOUND;  Surgeon: Erroll Luna, MD;  Location: [place] [place];  Service: General;  Laterality: Right;  IJ  . RADIOACTIVE SEED GUIDED MASTECTOMY WITH AXILLARY SENTINEL LYMPH NODE BIOPSY Left 08/20/2014   Procedure: LEFT BREAST LUMPECTOMY WITH RADIOACTIVE SEED LOCALIZATION AND SENTINEL LYMPH NODE MAPPING;  Surgeon: Erroll Luna, MD;  Location: [facility];  Service: General;  Laterality: Left;  . RE-EXCISION OF BREAST LUMPECTOMY Left 09/17/2014   Procedure: RE-EXCISION  OF BREAST LUMPECTOMY;  Surgeon: Erroll Luna, MD;  Location: [place];  Service: General;  Laterality: Left;  . TUBAL LIGATION      SOCIAL HISTORY: Social History   Social History  . Marital status: Divorced    Spouse name: N/A  . Number of children: 2  . Years of education: N/A   Occupational History  . Not on file.   Social History Main Topics  . Smoking status: Former Smoker    Packs/day: 1.50    Types: Cigarettes    Quit date: 02/03/2016  . Smokeless tobacco: Never Used  . Alcohol use No  . Drug use: No  . Sexual activity: No   Other Topics Concern  . Not on file   Social History Narrative  . No  narrative on file  Smokes 2 PPD   FAMILY HISTORY: Family History  Problem Relation Age of Onset  . Diabetes Mother   . Ovarian cancer Mother 64  . Other Father     died in MVA at age 48  . Other Sister     mitral valve disorder  . Melanoma Sister 54    removed from back of leg  . Colon cancer Brother     early 33s, succumbed to disease  . Cancer Paternal Aunt     leukemia, bone, or lung cancer  . Alzheimer's disease Maternal Grandmother   . Heart attack Maternal Grandfather   . Heart attack Paternal Grandmother   . COPD Paternal Grandfather   . Emphysema Paternal Grandfather   . Congestive Heart Failure Paternal Aunt   . Stomach cancer Paternal Uncle   . Kidney cancer Maternal Uncle   . Cancer Cousin     dx. teens/dx. 40s  . Cancer Cousin   . Colon cancer Cousin    indicated that her mother is deceased. She indicated that her father is deceased. She indicated that her sister is alive. She indicated that her brother is deceased. She indicated that her maternal grandmother is deceased. She indicated that her maternal grandfather is deceased. She indicated that her paternal grandmother is deceased. She indicated that her paternal grandfather is deceased. She indicated that her daughter is alive. She indicated that her son is alive. She indicated  that only one of her two maternal aunts is alive. She indicated that only one of her three maternal uncles is alive. She indicated that only one of her three paternal aunts is alive. She indicated that both of her paternal uncles are deceased. She indicated that both of her cousins are deceased.    ALLERGIES:  has No Known Allergies.  MEDICATIONS:  Current Outpatient Prescriptions  Medication Sig Dispense Refill  . albuterol (PROVENTIL HFA;VENTOLIN HFA) 108 (90 BASE) MCG/ACT inhaler Inhale 2 puffs into the lungs every 6 (six) hours as needed for wheezing or shortness of breath.    . ALPRAZolam (XANAX) 0.5 MG tablet Take 0.5 mg by mouth 3 (three) times daily as needed for anxiety.    Marland Kitchen amoxicillin (AMOXIL) 500 MG capsule Take 500 mg by mouth 3 (three) times daily.    . calcium carbonate (TUMS - DOSED IN MG ELEMENTAL CALCIUM) 500 MG chewable tablet Chew 1 tablet by mouth 3 (three) times daily with meals. Reported on 12/07/2015    . Calcium Citrate-Vitamin D (CALCIUM + D PO) Take by mouth daily. Reported on 12/07/2015    . celecoxib (CELEBREX) 200 MG capsule TAKE ONE CAPSULE BY MOUTH TWICE DAILY 60 capsule 2  . docusate sodium (COLACE) 100 MG capsule Take 100 mg by mouth 3 (three) times daily as needed for mild constipation. Reported on 12/07/2015    . hydrochlorothiazide (MICROZIDE) 12.5 MG capsule Take 12.5 mg by mouth daily. Reported on 12/07/2015    . hydrocortisone cream 0.5 % Apply 1 application topically 2 (two) times daily. 30 g 0  . pantoprazole (PROTONIX) 40 MG tablet Take 1 tablet (40 mg total) by mouth daily. 90 tablet 3  . tamoxifen (NOLVADEX) 20 MG tablet Take 1 tablet (20 mg total) by mouth daily. 30 tablet 3   No current facility-administered medications for this visit.     Review of Systems  Constitutional: Positive for weight gain. Negative for fever, chills, weight loss and malaise/fatigue.  HENT: Negative for congestion, hearing loss, nosebleeds, sore  throat and tinnitus. Eyes:  Negative for blurred vision, double vision, pain and discharge.  Respiratory: Negative for cough, hemoptysis, sputum production, shortness of breath and wheezing.  Cardiovascular: Negative for chest pain, palpitations, claudication, leg swelling and PND.  Gastrointestinal:  Negative for nausea, vomiting, abdominal pain, diarrhea, constipation, blood in stool and melena.  Genitourinary: Negative for dysuria, urgency, frequency and hematuria.  Musculoskeletal: Negative for myalgias and falls.  Skin: Positive for cyst. Negative for itching and rash.  Neurological: Negative for dizziness, tingling, tremors, sensory change, speech change, focal weakness, seizures, loss of consciousness, weakness and headaches.  Endo/Heme/Allergies: Does not bruise/bleed easily.  Psychiatric/Behavioral:  Negative for suicidal ideas, memory loss and substance abuse. The patient is not nervous/anxious and does not have insomnia.   14 point review of systems was performed and is negative except as detailed under history of present illness and above    PHYSICAL EXAMINATION: ECOG PERFORMANCE STATUS: 0 - Asymptomatic  Vitals:   03/31/16 1347  BP: (!) 150/72  Pulse: 60  Resp: 18  Temp: 97.9 F (36.6 C)   Filed Weights   03/31/16 1347  Weight: 257 lb 3.2 oz (116.7 kg)   Physical Exam  Constitutional: She is oriented to person, place, and time and well-developed, well-nourished, and in no distress. Obese. HENT:  Head: Normocephalic and atraumatic.  Nose: Nose normal.  Mouth/Throat: Oropharynx is clear and moist. No oropharyngeal exudate.  Eyes: Conjunctivae and EOM are normal. Pupils are equal, round, and reactive to light. Right eye exhibits no discharge. Left eye exhibits no discharge. No scleral icterus.  Neck: Normal range of motion. Neck supple. No tracheal deviation present. No thyromegaly present.  Cardiovascular: Normal rate, regular rhythm and normal heart sounds.  Exam reveals no gallop and no  friction rub.   No murmur heard. Pulmonary/Chest: Effort normal and breath sounds normal. She has no wheezes. She has no rales.  Abdominal: Soft. Bowel sounds are normal. She exhibits no distension and no mass. There is no tenderness. There is no rebound and no guarding.  Musculoskeletal: Normal range of motion. She exhibits no edema.  Lymphadenopathy:    She has no cervical adenopathy.  Neurological: She is alert and oriented to person, place, and time. She has normal reflexes. No cranial nerve deficit. Gait normal. Coordination normal.  Skin: Raised, erythematous area anterior to left ear. Skin is warm and dry. No rash noted.  Psychiatric: Mood, memory, affect and judgment normal.  Nursing note and vitals reviewed.   LABORATORY DATA:  I have reviewed the data as listed Lab Results  Component Value Date   WBC 6.6 03/15/2016   HGB 15.6 (H) 03/15/2016   HCT 45.4 03/15/2016   MCV 90.3 03/15/2016   PLT 179 03/15/2016     Chemistry      Component Value Date/Time   NA 138 03/15/2016 1032   K 4.8 03/15/2016 1032   CL 103 03/15/2016 1032   CO2 30 03/15/2016 1032   BUN 20 03/15/2016 1032   CREATININE 0.88 03/15/2016 1032      Component Value Date/Time   CALCIUM 8.9 03/15/2016 1032   ALKPHOS 48 03/15/2016 1032   AST 21 03/15/2016 1032   ALT 20 03/15/2016 1032   BILITOT 1.7 (H) 03/15/2016 1032     RADIOLOGY: I have reviewed the above documentation for accuracy and completeness, and I agree with the above.  Study Result     CLINICAL DATA: Right hip pain radiating down to right knee off and on for 4  years. [place] down steps 4 years ago.  EXAM: DG HIP (WITH OR WITHOUT PELVIS) 2-3V RIGHT  COMPARISON: None.  FINDINGS: Early symmetric degenerative changes in the hips bilaterally with slight joint space narrowing and early spurring. No acute bony abnormality. Specifically, no fracture, subluxation, or dislocation. Soft tissues are intact.  IMPRESSION: No acute bony  abnormality.   Electronically Signed  By: Rolm Baptise M.D.  On: 09/07/2015 15:33   Study Result     CLINICAL DATA: 65 year old female with left breast cancer post lumpectomy 08/20/2014.  EXAM: DIGITAL DIAGNOSTIC LEFT MAMMOGRAM WITH 3D TOMOSYNTHESIS AND CAD  LEFT BREAST ULTRASOUND  COMPARISON: Previous exam(s).  ACR Breast Density Category b: There are scattered areas of fibroglandular density.  FINDINGS: No suspicious masses or calcifications are seen in the right breast. Postsurgical changes are present in the upper slightly outer left breast related to interval lumpectomy. A spot compression magnification CC view of the lumpectomy site in the left breast was performed. There is no mammographic evidence of recurrence at the lumpectomy site. There is an oval mixed density mass within the upper left breast anterior depth near the skin surface.  Mammographic images were processed with CAD.  Physical examination of the left breast reveals skin discoloration and thickening/changes related to prior radiation therapy. A surgical scar is present in the upper-outer left breast. The patient reports the left breast to be diffusely tender.  Targeted ultrasound of the left breast was performed demonstrating postsurgical changes with scar and fluid as well as subcutaneous edema at 1-2 o'clock 5 cm from the nipple measuring up to 3.7 x 1.1 x 4.1 cm. This is directly subjacent to the surgical site and corresponds well with the finding seen on mammography.  IMPRESSION: No mammographic evidence of malignancy in either breast, with postsurgical/ post treatment changes seen in the left breast sonographically.  RECOMMENDATION: Bilateral diagnostic mammography in 12 months.  I have discussed the findings and recommendations with the patient. Results were also provided in writing at the conclusion of the visit. If applicable, a reminder letter will be sent to the  patient regarding the next appointment.  BI-RADS CATEGORY 2: Benign.   Electronically Signed  By: Everlean Alstrom M.D.  On: 08/25/2015 10:44   ASSESSMENT & PLAN:  Stage II, infiltrating ductal carcinoma of the left breast, ER positive, PR positive, HER-2 negative Anxiety Tobacco abuse Insomnia, difficulty falling asleep Constipation DEXA  07/01/2015 with osteopenia HTN Leg/Back pain Obesity  She has had her mammogram. She is doing well on Tamoxifen. She needs a referral back to Dr. Glo Herring. We discussed that she will need yearly gynecologic exam while on tamoxifen.  She has quit smoking.   We discussed the best options for weight loss, diet and exercise. We discussed the benefits of physical activity for breast cancer survivors.  I have given her a prescription of contrave to try for weight loss.   We did discuss CT lung cancer screening today, she is open to this moving forward. Will re-address at follow-up.  Follow up in 3-4 months.  This note was signed electronically.    This document serves as a record of services personally performed by Ancil Linsey, MD. It was created on her behalf by Elmyra Ricks, a trained medical scribe. The creation of this record is based on the scribe's personal observations and the provider's statements to them. This document has been checked and approved by the attending provider.  I have reviewed the above documentation for accuracy and completeness, and I agree  with the above.  Kelby Fam. Penland MD

## 2016-04-01 IMAGING — US US BREAST LTD UNI LEFT INC AXILLA
1 series · 7 of 7 positions shown · non-contrast
Comparison: Previous exam(s).

CLINICAL DATA: 64-year-old female with left breast cancer post
lumpectomy [DATE].

EXAM:
DIGITAL DIAGNOSTIC LEFT MAMMOGRAM WITH 3D TOMOSYNTHESIS AND CAD
LEFT BREAST ULTRASOUND

[Series 1: us breast ltd uni left inc axilla · 0.07mm/px · 7 of 7 slices shown]
[im 1/7]
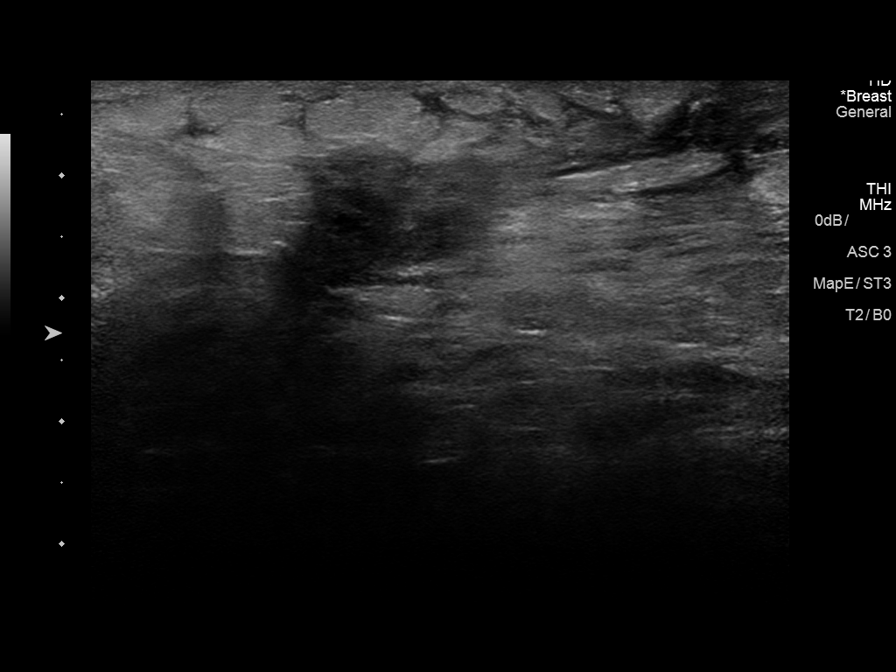
[im 2/7]
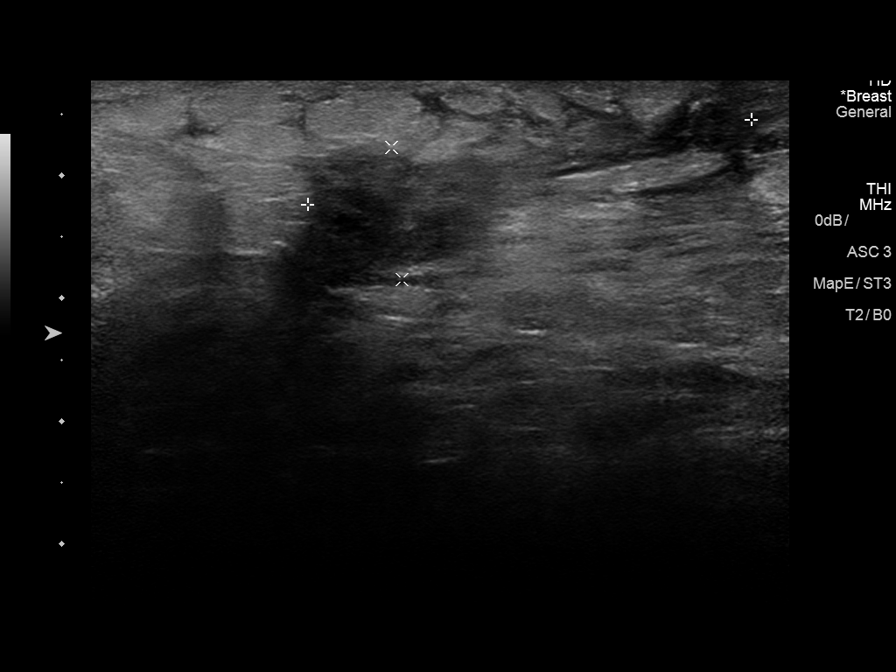
[im 3/7]
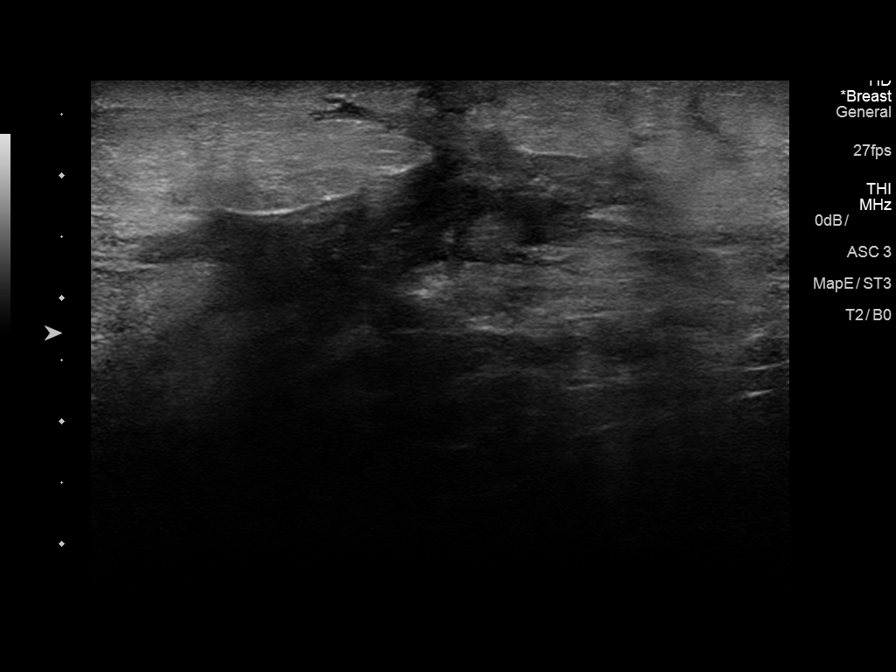
[im 4/7]
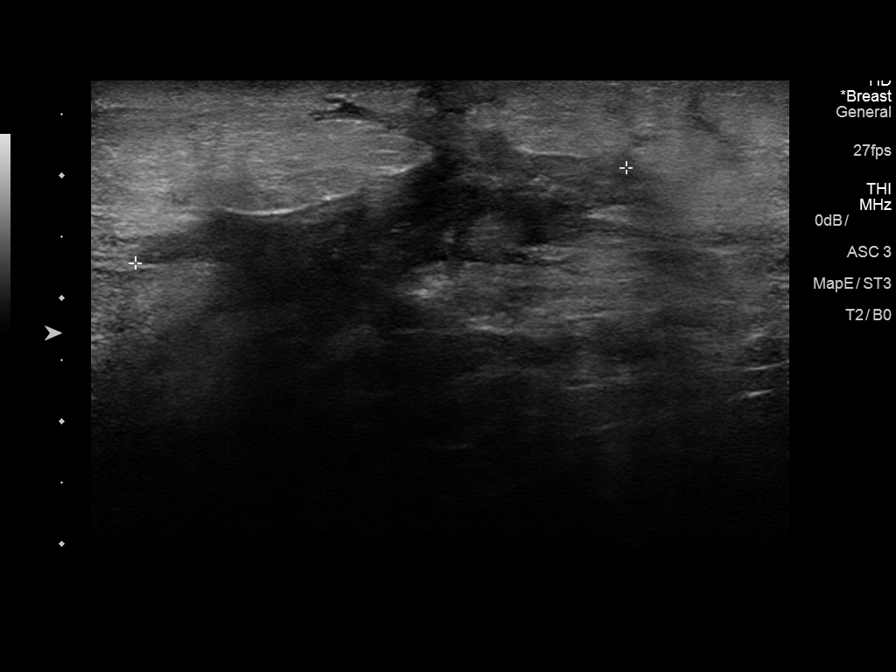
[im 5/7]
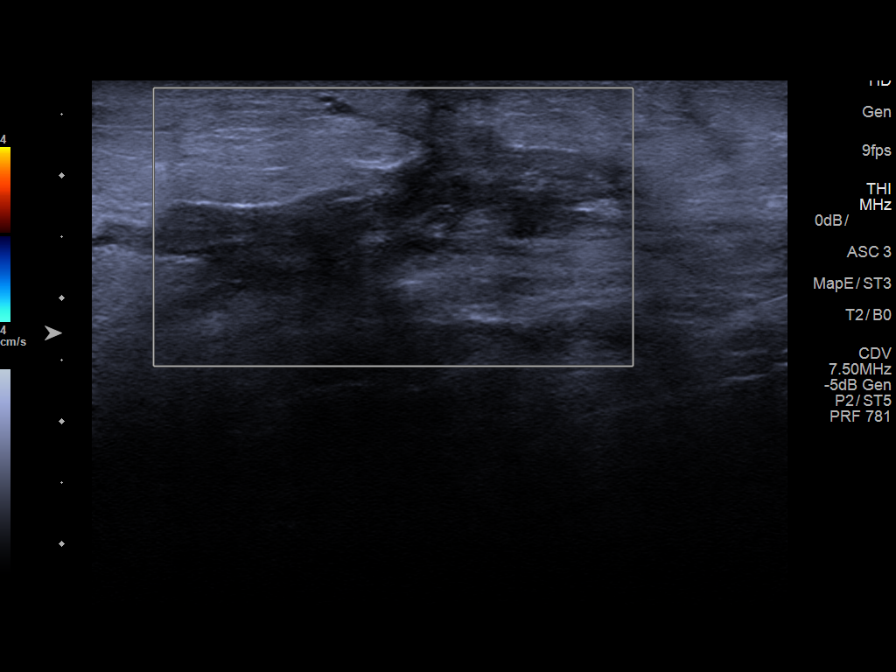
[im 6/7]
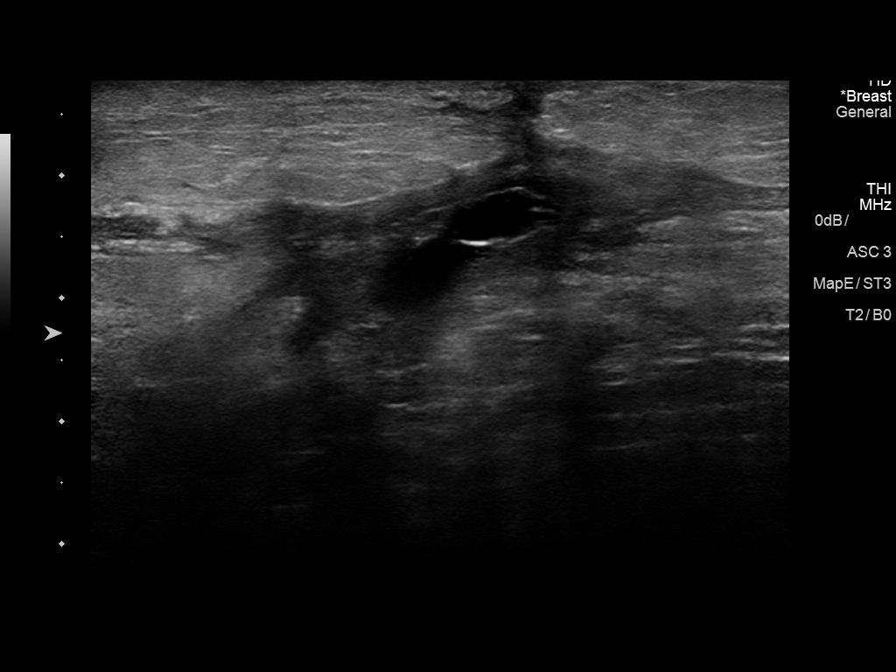
[im 7/7]
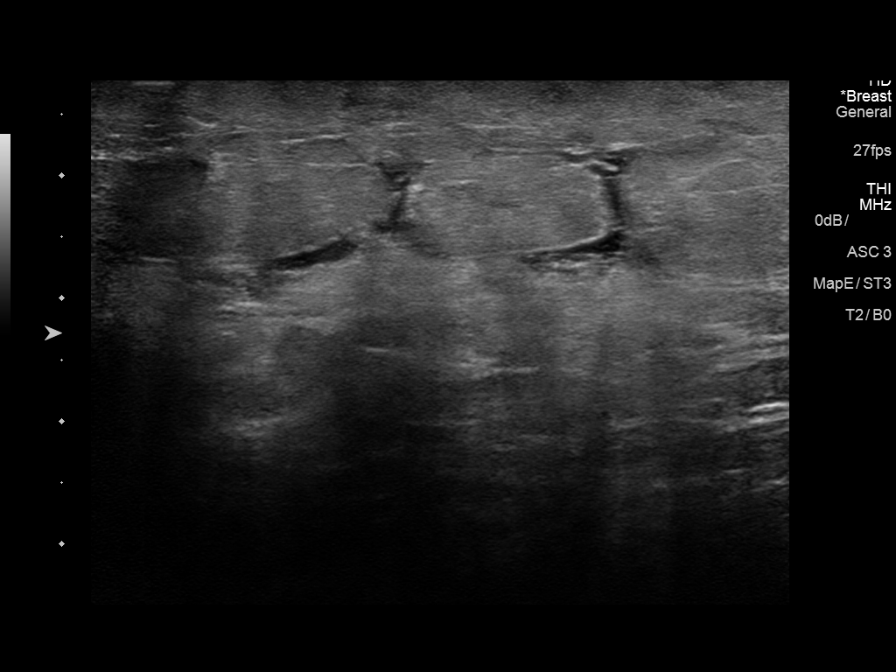

[7 of 7 positions shown; findings below may reference images not displayed]

ACR Breast Density Category b: There are scattered areas of
fibroglandular density.
FINDINGS: No suspicious masses or calcifications are seen in the right breast.
Postsurgical changes are present in the upper slightly outer left
breast related to interval lumpectomy. A spot compression
magnification CC view of the lumpectomy site in the left breast was
performed. There is no mammographic evidence of recurrence at the
lumpectomy site. There is an oval mixed density mass within the
upper left breast anterior depth near the skin surface.

Mammographic images were processed with CAD.

Physical examination of the left breast reveals skin discoloration
and thickening/changes related to prior radiation therapy. A
surgical scar is present in the upper-outer left breast. The patient
reports the left breast to be diffusely tender.

Targeted ultrasound of the left breast was performed demonstrating
postsurgical changes with scar and fluid as well as subcutaneous
edema at 1-2 o'clock 5 cm from the nipple measuring up to 3.7 x
x 4.1 cm. This is directly subjacent to the surgical site and
corresponds well with the finding seen on mammography.
IMPRESSION: No mammographic evidence of malignancy in either breast, with
postsurgical/ post treatment changes seen in the left breast
sonographically.

RECOMMENDATION:
Bilateral diagnostic mammography in 12 months.

I have discussed the findings and recommendations with the patient.
Results were also provided in writing at the conclusion of the
visit. If applicable, a reminder letter will be sent to the patient
regarding the next appointment.

BI-RADS CATEGORY  2: Benign.

## 2016-04-03 ENCOUNTER — Encounter (HOSPITAL_COMMUNITY): Payer: Self-pay | Admitting: Hematology & Oncology

## 2016-04-04 ENCOUNTER — Telehealth (HOSPITAL_COMMUNITY): Payer: Self-pay | Admitting: *Deleted

## 2016-04-04 MED ORDER — ERGOCALCIFEROL 1.25 MG (50000 UT) PO CAPS: 50000.0000 [IU] | ORAL_CAPSULE | ORAL | 6 refills | Status: DC

## 2016-04-04 NOTE — Telephone Encounter (Signed)
-----   Message from Patrici Ranks, MD sent at 04/01/2016  4:03 PM EDT ----- Drisdol once weekly. 50000 units #4 with 6 refills. Dr.P

## 2016-04-04 NOTE — Telephone Encounter (Signed)
Pt aware that VItD has been sent in to her pharmacy and the pt wants to know if there is something else you can give her for weight loss, or if there is something over the counter she could take. Pt states that her insurance would not cover the Rx she was given.

## 2016-04-13 ENCOUNTER — Other Ambulatory Visit (HOSPITAL_COMMUNITY): Payer: Self-pay | Admitting: Hematology & Oncology

## 2016-04-13 DIAGNOSIS — M5441 Lumbago with sciatica, right side: Secondary | ICD-10-CM

## 2016-04-13 DIAGNOSIS — C50412 Malignant neoplasm of upper-outer quadrant of left female breast: Secondary | ICD-10-CM

## 2016-04-20 ENCOUNTER — Encounter: Payer: Self-pay | Admitting: Obstetrics and Gynecology

## 2016-04-20 ENCOUNTER — Other Ambulatory Visit (HOSPITAL_COMMUNITY)
Admission: RE | Admit: 2016-04-20 | Discharge: 2016-04-20 | Disposition: A | Discharge status: Home / Self Care | Payer: BLUE CROSS/BLUE SHIELD | Source: Ambulatory Visit | Attending: Obstetrics and Gynecology | Admitting: Obstetrics and Gynecology

## 2016-04-20 ENCOUNTER — Ambulatory Visit (INDEPENDENT_AMBULATORY_CARE_PROVIDER_SITE_OTHER): Payer: BLUE CROSS/BLUE SHIELD | Admitting: Obstetrics and Gynecology

## 2016-04-20 VITALS — BP 130/70 | HR 74 | Ht 60.0 in | Wt 255.2 lb

## 2016-04-20 DIAGNOSIS — Z1151 Encounter for screening for human papillomavirus (HPV): Secondary | ICD-10-CM | POA: Insufficient documentation

## 2016-04-20 DIAGNOSIS — Z01419 Encounter for gynecological examination (general) (routine) without abnormal findings: Secondary | ICD-10-CM | POA: Insufficient documentation

## 2016-04-20 DIAGNOSIS — Z8041 Family history of malignant neoplasm of ovary: Secondary | ICD-10-CM

## 2016-04-20 DIAGNOSIS — F419 Anxiety disorder, unspecified: Secondary | ICD-10-CM

## 2016-04-20 NOTE — Progress Notes (Addendum)
Patient ID: Regina Mcmahon, female   DOB: January 23, 1951, 65 y.o.   MRN: [id number]  Assessment:  Annual Gyn Exam Encouraged healthy lifestyle and healthy eating habits   Morbid Obesity BMI 49.8 Plan:  1. pap smear done, next pap due in 3 years  2. return annually or prn 3    Annual mammogram advised 4. Results via MyChart   Subjective:   Chief Complaint  Patient presents with  . Gynecologic Exam    pap & physical, discuss hysterectomy     Regina Mcmahon is a 65 y.o. female No obstetric history on file. who presents for annual exam. No LMP recorded. Patient is postmenopausal.   The patient has no complaints today. Pt states she quit smoking in 01/2016. She does reports she has been eating more since she quit smoking and as a result has gained weight. She would like to discuss the possibility of hysterectomy with bilateral oophorectomy d/t her personal and family history of cancer. She notes her mother had ovarian cancer.   Discussed with pt risks and benefits of forgoing oophorectomy. Pt is agreeable to forgoing surgery at this time at my recommendation. I have reviewed the last 2 notes by Dr Whitney Muse, see no evidence of BrCa I testing or results, and see that cancer of breast in pt is ER and PR positive  Additionally discussed importance of healthy eating habits and healthy lifestyle with pt. Encouraged pt to monitor serving sizes and actively make healthy choices. Also encouraged pt to drink 8 oz water prior to every meal.   At end of discussion, pt had opportunity to ask questions and has no further questions at this time.   Greater than 50% was spent in counseling and coordination of care with the patient. Total time greater than: 45 minutes   The following portions of the patient's history were reviewed and updated as appropriate: allergies, current medications, past family history, past medical history, past social history, past surgical history and problem list. Past Medical History:   Diagnosis Date  . Asthma   . Asymptomatic varicose veins   . Breast cancer ([place]) 06/2014   left  . Chronic airway obstruction ([place])   . COPD (chronic obstructive pulmonary disease) ([place])   . Depression   . Hemorrhage rectum    and anus.  . Hypertension   . Insomnia   . Other symptoms involving digestive system(787.99)   . Pain    Hx.pain in joint involving pelvic region and thigh; pain in limb  . Palpitations   . Panic attacks   . Sinusitis   . Symptomatic menopausal or female climacteric states     Past Surgical History:  Procedure Laterality Date  . CESAREAN SECTION    . CHOLECYSTECTOMY  1985  . COLONOSCOPY  2002   Dr. Lindalou Hose: internal hemorrhoids, one small rectal polyp, adenomatous path   . COLONOSCOPY N/A 11/23/2015   Procedure: COLONOSCOPY;  Surgeon: Danie Binder, MD;  [facility];  Service: Endoscopy;  Laterality: N/A;  1100 - moved to 10:45 - office to notify  . ESOPHAGOGASTRODUODENOSCOPY N/A 11/23/2015   Procedure: ESOPHAGOGASTRODUODENOSCOPY (EGD);  Surgeon: Danie Binder, MD;  [facility];  Service: Endoscopy;  Laterality: N/A;  . PORT-A-CATH REMOVAL  02/2015  . PORTACATH PLACEMENT Right 09/17/2014   Procedure: INSERTION PORT-A-CATH WITH ULTRASOUND;  Surgeon: Erroll Luna, MD;  Location: [facility];  Service: General;  Laterality: Right;  IJ  . RADIOACTIVE SEED GUIDED MASTECTOMY WITH AXILLARY  SENTINEL LYMPH NODE BIOPSY Left 08/20/2014   Procedure: LEFT BREAST LUMPECTOMY WITH RADIOACTIVE SEED LOCALIZATION AND SENTINEL LYMPH NODE MAPPING;  Surgeon: Erroll Luna, MD;  Location: [facility];  Service: General;  Laterality: Left;  . RE-EXCISION OF BREAST LUMPECTOMY Left 09/17/2014   Procedure: RE-EXCISION OF BREAST LUMPECTOMY;  Surgeon: Erroll Luna, MD;  Location: [facility];  Service: General;  Laterality: Left;  . TUBAL LIGATION       Current Outpatient Prescriptions:  .  albuterol (PROVENTIL  HFA;VENTOLIN HFA) 108 (90 BASE) MCG/ACT inhaler, Inhale 2 puffs into the lungs every 6 (six) hours as needed for wheezing or shortness of breath., Disp: , Rfl:  .  ALPRAZolam (XANAX) 0.5 MG tablet, Take 0.5 mg by mouth 3 (three) times daily as needed for anxiety., Disp: , Rfl:  .  amoxicillin (AMOXIL) 500 MG capsule, Take 500 mg by mouth 3 (three) times daily., Disp: , Rfl:  .  calcium carbonate (TUMS - DOSED IN MG ELEMENTAL CALCIUM) 500 MG chewable tablet, Chew 1 tablet by mouth 3 (three) times daily with meals. Reported on 12/07/2015, Disp: , Rfl:  .  Calcium Citrate-Vitamin D (CALCIUM + D PO), Take by mouth daily. Reported on 12/07/2015, Disp: , Rfl:  .  celecoxib (CELEBREX) 200 MG capsule, TAKE ONE CAPSULE BY MOUTH TWICE DAILY, Disp: 60 capsule, Rfl: 2 .  ergocalciferol (VITAMIN D2) 50000 units capsule, Take 1 capsule (50,000 Units total) by mouth once a week., Disp: 4 capsule, Rfl: 6 .  hydrochlorothiazide (MICROZIDE) 12.5 MG capsule, Take 12.5 mg by mouth daily. Reported on 12/07/2015, Disp: , Rfl:  .  pantoprazole (PROTONIX) 40 MG tablet, Take 1 tablet (40 mg total) by mouth daily., Disp: 90 tablet, Rfl: 3 .  tamoxifen (NOLVADEX) 20 MG tablet, TAKE ONE TABLET BY MOUTH ONCE DAILY, Disp: 30 tablet, Rfl: 3  Review of Systems Constitutional: negative Gastrointestinal: negative Genitourinary: negative  Objective:  BP 130/70   Pulse 74   Ht 5' (1.524 m)   Wt 255 lb 3.2 oz (115.8 kg)   BMI 49.84 kg/m    BMI: Body mass index is 49.84 kg/m.  General Appearance: Alert, appropriate appearance for age. No acute distress HEENT: Grossly normal Neck / Thyroid:  Cardiovascular: RRR; normal S1, S2, no murmur Lungs: CTA bilaterally Back: No CVAT Breast Exam: No dimpling, nipple retraction or discharge. No masses or nodes. She has evidence of prior lumpectomy on the left. As well as axillary node dissection. Gastrointestinal: Soft, non-tender, no masses or organomegaly Pelvic Exam:  External  genitalia: normal general appearance Vaginal: normal mucosa without prolapse or lesions Cervix: normal appearance Adnexa: normal bimanual exam Uterus: normal single, nontender, small  Rectovaginal: normal rectal, no masses Lymphatic Exam: Non-palpable nodes in neck, clavicular, axillary, or inguinal regions  Skin: no rash or abnormalities Neurologic: Normal gait and speech, no tremor  Psychiatric: Alert and oriented, appropriate affect.  Urinalysis:Not done  Guaiac negative    Mallory Shirk. MD Pgr [phone] 3:32 PM   By signing my name below, I, Hansel Feinstein, attest that this documentation has been prepared under the direction and in the presence of Jonnie Kind, MD. Electronically Signed: Hansel Feinstein, ED Scribe. 04/20/16. 3:32 PM.  I personally performed the services described in this documentation, which was SCRIBED in my presence. The recorded information has been reviewed and considered accurate. It has been edited as necessary during review. Jonnie Kind, MD

## 2016-04-22 LAB — CYTOLOGY - PAP

## 2016-07-20 ENCOUNTER — Encounter: Payer: Self-pay | Admitting: Gastroenterology

## 2016-08-02 ENCOUNTER — Encounter (HOSPITAL_COMMUNITY): Payer: BLUE CROSS/BLUE SHIELD | Admitting: Hematology & Oncology

## 2016-08-02 ENCOUNTER — Other Ambulatory Visit (HOSPITAL_COMMUNITY): Payer: BLUE CROSS/BLUE SHIELD

## 2016-08-03 NOTE — Progress Notes (Signed)
This encounter was created in error - please disregard.

## 2016-08-14 ENCOUNTER — Other Ambulatory Visit (HOSPITAL_COMMUNITY): Payer: Self-pay | Admitting: Hematology & Oncology

## 2016-08-14 DIAGNOSIS — C50412 Malignant neoplasm of upper-outer quadrant of left female breast: Secondary | ICD-10-CM

## 2016-08-14 DIAGNOSIS — M5441 Lumbago with sciatica, right side: Secondary | ICD-10-CM

## 2016-08-15 ENCOUNTER — Other Ambulatory Visit (HOSPITAL_COMMUNITY): Payer: Self-pay | Admitting: Hematology & Oncology

## 2016-08-15 DIAGNOSIS — Z853 Personal history of malignant neoplasm of breast: Secondary | ICD-10-CM

## 2016-08-16 ENCOUNTER — Other Ambulatory Visit (HOSPITAL_COMMUNITY): Payer: Self-pay

## 2016-08-16 DIAGNOSIS — C50412 Malignant neoplasm of upper-outer quadrant of left female breast: Secondary | ICD-10-CM

## 2016-08-16 MED ORDER — TAMOXIFEN CITRATE 20 MG PO TABS: 20.0000 mg | ORAL_TABLET | Freq: Every day | ORAL | 3 refills | Status: DC

## 2016-08-16 NOTE — Telephone Encounter (Signed)
Patient called stating she needed refill on tamoxifen and she was having pain from a bad tooth she has. Her family MD has put her on an antibiotic but she is having a large amount of pain. She has tried topical pain relievers such as oragel with no relief. Currently she states she is taking 400 mg of Ibuprofen every 2 hours. Explained to patient that was too much ibuprofen. Reviewed above with PA-C. Instructed patient to alternate Tylenol and Ibuprofen every 2 hours and try to stretch the time between doses to every 3 hours. Also refilled her Tamoxifen. Patient verbalized understanding.

## 2016-08-22 ENCOUNTER — Other Ambulatory Visit (HOSPITAL_COMMUNITY): Payer: Self-pay | Admitting: Hematology & Oncology

## 2016-08-22 DIAGNOSIS — M5441 Lumbago with sciatica, right side: Secondary | ICD-10-CM

## 2016-08-22 DIAGNOSIS — C50412 Malignant neoplasm of upper-outer quadrant of left female breast: Secondary | ICD-10-CM

## 2016-09-12 ENCOUNTER — Encounter (HOSPITAL_COMMUNITY): Payer: BLUE CROSS/BLUE SHIELD | Attending: Oncology | Admitting: Oncology

## 2016-09-12 ENCOUNTER — Encounter (HOSPITAL_COMMUNITY): Payer: BLUE CROSS/BLUE SHIELD | Attending: Oncology

## 2016-09-12 ENCOUNTER — Encounter (HOSPITAL_COMMUNITY): Payer: Self-pay

## 2016-09-12 VITALS — BP 137/69 | HR 65 | Temp 98.0°F | Resp 20 | Wt 263.2 lb

## 2016-09-12 DIAGNOSIS — C50412 Malignant neoplasm of upper-outer quadrant of left female breast: Secondary | ICD-10-CM

## 2016-09-12 DIAGNOSIS — Z79811 Long term (current) use of aromatase inhibitors: Secondary | ICD-10-CM

## 2016-09-12 DIAGNOSIS — R0602 Shortness of breath: Secondary | ICD-10-CM | POA: Diagnosis not present

## 2016-09-12 DIAGNOSIS — Z170 Estrogen receptor positive status [ER+]: Secondary | ICD-10-CM

## 2016-09-12 LAB — CBC WITH DIFFERENTIAL/PLATELET
Basophils Absolute: 0 10*3/uL (ref 0.0–0.1)
Basophils Relative: 1 %
Eosinophils Absolute: 0.3 10*3/uL (ref 0.0–0.7)
Eosinophils Relative: 5 %
HCT: 44.6 % (ref 36.0–46.0)
Hemoglobin: 15.2 g/dL — ABNORMAL HIGH (ref 12.0–15.0)
Lymphocytes Relative: 23 %
Lymphs Abs: 1.4 10*3/uL (ref 0.7–4.0)
MCH: 30.5 pg (ref 26.0–34.0)
MCHC: 34.1 g/dL (ref 30.0–36.0)
MCV: 89.6 fL (ref 78.0–100.0)
Monocytes Absolute: 0.3 10*3/uL (ref 0.1–1.0)
Monocytes Relative: 6 %
Neutro Abs: 4.1 10*3/uL (ref 1.7–7.7)
Neutrophils Relative %: 65 %
Platelets: 169 10*3/uL (ref 150–400)
RBC: 4.98 MIL/uL (ref 3.87–5.11)
RDW: 12.1 % (ref 11.5–15.5)
WBC: 6.2 10*3/uL (ref 4.0–10.5)

## 2016-09-12 LAB — COMPREHENSIVE METABOLIC PANEL
ALT: 17 U/L (ref 14–54)
AST: 17 U/L (ref 15–41)
Albumin: 3.8 g/dL (ref 3.5–5.0)
Alkaline Phosphatase: 57 U/L (ref 38–126)
Anion gap: 8 (ref 5–15)
BUN: 13 mg/dL (ref 6–20)
CO2: 31 mmol/L (ref 22–32)
Calcium: 9.7 mg/dL (ref 8.9–10.3)
Chloride: 99 mmol/L — ABNORMAL LOW (ref 101–111)
Creatinine, Ser: 1.02 mg/dL — ABNORMAL HIGH (ref 0.44–1.00)
GFR calc Af Amer: >60 mL/min (ref >60)
GFR calc non Af Amer: 57 mL/min — ABNORMAL LOW (ref >60)
Glucose, Bld: 132 mg/dL — ABNORMAL HIGH (ref 65–99)
Potassium: 4.8 mmol/L (ref 3.5–5.1)
Sodium: 138 mmol/L (ref 135–145)
Total Bilirubin: 1.1 mg/dL (ref 0.3–1.2)
Total Protein: 6.9 g/dL (ref 6.5–8.1)

## 2016-09-12 NOTE — Patient Instructions (Addendum)
Regina Mcmahon at [facility] Discharge Instructions  RECOMMENDATIONS MADE BY THE CONSULTANT AND ANY TEST RESULTS WILL BE SENT TO YOUR REFERRING PHYSICIAN.  You were seen today by Dr. Barron Schmid CT scan of Chest will be scheduled You will have lab work and a follow up visit in 6 months See Amy up front for appointments   Thank you for choosing [place] at [facility] to provide your oncology and hematology care.  To afford each patient quality time with our provider, please arrive at least 15 minutes before your scheduled appointment time.    If you have a lab appointment with the [facility] please come in thru the  Main Entrance and check in at the main information desk  You need to re-schedule your appointment should you arrive 10 or more minutes late.  We strive to give you quality time with our providers, and arriving late affects you and other patients whose appointments are after yours.  Also, if you no show three or more times for appointments you may be dismissed from the clinic at the providers discretion.     Again, thank you for choosing [facility]  Our hope is that these requests will decrease the amount of time that you wait before being seen by our physicians.       _____________________________________________________________  Should you have questions after your visit to [facility], please contact our office at [phone] between the hours of 8:30 a.m. and 4:30 p.m.  Voicemails left after 4:30 p.m. will not be returned until the following business day.  For prescription refill requests, have your pharmacy contact our office.       Resources For Cancer Patients and their Caregivers ? American Cancer Society: Can assist with transportation, wigs, general needs, runs Look Good Feel Better.        (504) 154-6908 ? Cancer Care: Provides financial assistance, online support groups,  medication/co-pay assistance.  1-800-813-HOPE (480) 593-3589) ? [place] Assists [place] cancer patients and their families through emotional , educational and financial support.  (709)538-0776 ? [place] DSS Where to apply for food stamps, Medicaid and utility assistance. (330)254-6989 ? [facility]: Transportation to medical appointments. 216-486-2598 ? Social Security Administration: May apply for disability if have a Stage IV cancer. 425-247-2011 (425)508-0265 ? [employer], Disability and Transit Services: Assists with nutrition, care and transit needs. [place] Support Programs: '@10RELATIVEDAYS'$ @ > Cancer Support Group  2nd Tuesday of the month 1pm-2pm, Journey Room  > Creative Journey  3rd Tuesday of the month 1130am-1pm, Journey Room  > Look Good Feel Better  1st Wednesday of the month 10am-12 noon, Journey Room (Call [place] to register [phone])

## 2016-09-12 NOTE — Progress Notes (Signed)
[facility] Progress Note  Patient Care Team: Sinda Du, MD as PCP - General (Internal Medicine) Patrici Ranks, MD as Consulting Physician (Hematology and Oncology) Erroll Luna, MD as Consulting Physician (General Surgery) Thea Silversmith, MD (Inactive) as Consulting Physician (Radiation Oncology) Sylvan Cheese, NP as Nurse Practitioner (Hematology and Oncology) Danie Binder, MD as Consulting Physician (Gastroenterology)  CHIEF COMPLAINTS/PURPOSE OF CONSULTATION:  L breast cancer History of L breast core biopsy for microcalcifications in 2014, fibroadenoma. CMF cycle 1 on 10/02/2014 No matching staging information was found for the patient.  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       06/24/2014 Breast US    Left breast: ill-defined predominantly hypoechoic area with some increased echogenicity in the 2 o'clock position of the left breast, 7 cm from the nipple. 2.6 x 2.3 x 1.8 cm in maximum dimensions.      07/01/2014 Initial Biopsy    Left breast core needle bx: Invasive mammary carcinoma with lobular features, ER+ (100%), PR+ (91%), HER2/neu negative (ratio 1.09), Ki-67 32%. E-cadherin strongly positive, diagnostic for IDC       07/01/2014 Clinical Stage    Stage IIA: T2 N0      08/20/2014 Definitive Surgery    Left breast seed localized lumpectomy/SLNB: IDC, +LVI, DCIS, 1 LN removed and positive for metastatic carcinoma. Grade 2. HER2/neu repeated and negative (ratio 0.69-1.9).       08/20/2014 Pathologic Stage    Stage  IIB: pT2 pN1a pMx      09/17/2014 Surgery    Port-a cath placement and re-excision      10/02/2014 - 01/15/2015 Chemotherapy    CMF x 6 cycles.  patient refused Taxane-containing chemotherapy.      03/07/2015 Procedure    Comp Cancer Panel reveals no variant at APC, ATM, AXIN2, BARD1, BMPR1A, BRCA1, BRCA2, BRIP1, CDH1, CDK4, CDKN2A, CHEK2, FANCC, MLH1, MSH2, MSH6, MUTYH, NBN, PALB2, PMS2, POLD1, POLE, PTEN, RAD51C, RAD51D, SMAD4, STK11, TP53, VHL, and XRCC2.      04/06/2015 - 05/21/2015 Radiation Therapy    Adjuvant RT Pablo Ledger): Left breast/ 45 Gy over 25 fractions.  Left supraclavicular fossa and axilla/ 45 Gy over 25 fractions. Left breast boost/ 16 Gy over 8 fractions. Total dose: 61 Gy      06/04/2015 - 08/07/2015 Anti-estrogen oral therapy    Exemestane 25 mg daily.  Planned duration of therapy 5 years.       07/23/2015 Survivorship    Survivorship care plan completed and mailed to patient in lieu of in person visit      08/06/2015 Adverse Reaction    Severe hot flashes and mood swings      08/08/2015 - 12/07/2015 Anti-estrogen oral therapy    Arimidex      12/04/2015 Adverse Reaction    Worsening depression, patient stopped Arimidex      12/07/2015 -  Anti-estrogen oral therapy    Tamoxifen       HISTORY OF PRESENTING ILLNESS:  Regina Mcmahon 66 y.o. female  is here for follow-up of a stage II ER positive PR positive HER-2 negative carcinoma the left breast.   She is still taking her tamoxifen. She has some mood swings, but other than that she has been doing well on it. She has not been going to annual eye exams, but is up to date on her gynecological exams. She had her mammogram last January, which was normal and is due for another this month. She does daily breast exams at home. She has some acid reflux that has caused some discomfort while swallowing. She takes Nurse, adult. She has stopped smoking, but states she feels the SOB has worsened since she quit. Doing light  activities like house work require her to sit down and rest. Denies any other concerns.   MEDICAL HISTORY:  Past Medical History:  Diagnosis Date  . Asthma   . Asymptomatic varicose veins   . Breast cancer ([place]) 06/2014   left  . Chronic airway obstruction ([place])   . COPD (chronic obstructive pulmonary disease) ([place])   . Depression   . Hemorrhage rectum    and anus.  . Hypertension   . Insomnia   . Other symptoms involving digestive system(787.99)   . Pain    Hx.pain in joint involving pelvic region and thigh; pain in limb  . Palpitations   . Panic attacks   . Sinusitis   . Symptomatic menopausal or female climacteric states     SURGICAL HISTORY: Past Surgical History:  Procedure Laterality Date  . CESAREAN SECTION    . CHOLECYSTECTOMY  1985  . COLONOSCOPY  2002   Dr. Lindalou Hose: internal hemorrhoids, one small rectal polyp, adenomatous path   . COLONOSCOPY N/A 11/23/2015   Procedure: COLONOSCOPY;  Surgeon: Danie Binder, MD;  [facility];  Service: Endoscopy;  Laterality: N/A;  1100 - moved to 10:45 - office to notify  . ESOPHAGOGASTRODUODENOSCOPY N/A 11/23/2015   Procedure: ESOPHAGOGASTRODUODENOSCOPY (EGD);  Surgeon: Danie Binder, MD;  [facility];  Service: Endoscopy;  Laterality: N/A;  . PORT-A-CATH REMOVAL  02/2015  . PORTACATH PLACEMENT Right 09/17/2014   Procedure: INSERTION PORT-A-CATH WITH ULTRASOUND;  Surgeon: Erroll Luna, MD;  Location: [place];  Service: General;  Laterality: Right;  IJ  . RADIOACTIVE SEED GUIDED MASTECTOMY WITH AXILLARY SENTINEL LYMPH NODE BIOPSY Left 08/20/2014   Procedure: LEFT BREAST LUMPECTOMY WITH RADIOACTIVE SEED LOCALIZATION AND SENTINEL LYMPH NODE MAPPING;  Surgeon: Erroll Luna, MD;  Location: [facility];  Service: General;  Laterality: Left;  . RE-EXCISION OF BREAST LUMPECTOMY Left 09/17/2014   Procedure: RE-EXCISION OF BREAST LUMPECTOMY;  Surgeon: Erroll Luna, MD;  Location:  [facility];  Service: General;  Laterality: Left;  . TUBAL LIGATION      SOCIAL HISTORY: Social History   Social History  . Marital status: Divorced    Spouse name: N/A  . Number of children: 2  . Years of education: N/A   Occupational History  . Not on file.   Social History Main Topics  . Smoking status: Former Smoker    Packs/day: 1.50    Types: Cigarettes    Quit date: 02/03/2016  . Smokeless tobacco: Never Used  . Alcohol use No  . Drug use: No  . Sexual activity: No   Other Topics Concern  . Not on file   Social History Narrative  . No narrative on file  Smokes 2 PPD   FAMILY HISTORY: Family History  Problem Relation Age of  Onset  . Diabetes Mother   . Ovarian cancer Mother 43  . Other Father     died in MVA at age 96  . Other Sister     mitral valve disorder  . Melanoma Sister 54    removed from back of leg  . Colon cancer Brother     early 15s, succumbed to disease  . Cancer Paternal Aunt     leukemia, bone, or lung cancer  . Alzheimer's disease Maternal Grandmother   . Heart attack Maternal Grandfather   . Heart attack Paternal Grandmother   . COPD Paternal Grandfather   . Emphysema Paternal Grandfather   . Congestive Heart Failure Paternal Aunt   . Stomach cancer Paternal Uncle   . Kidney cancer Maternal Uncle   . Cancer Cousin     dx. teens/dx. 40s  . Cancer Cousin   . Colon cancer Cousin    indicated that her mother is deceased. She indicated that her father is deceased. She indicated that her sister is alive. She indicated that her brother is deceased. She indicated that her maternal grandmother is deceased. She indicated that her maternal grandfather is deceased. She indicated that her paternal grandmother is deceased. She indicated that her paternal grandfather is deceased. She indicated that her daughter is alive. She indicated that her son is alive. She indicated that only one of her two maternal aunts is alive. She indicated  that only one of her three maternal uncles is alive. She indicated that only one of her three paternal aunts is alive. She indicated that both of her paternal uncles are deceased. She indicated that both of her cousins are deceased.    ALLERGIES:  has No Known Allergies.  MEDICATIONS:  Current Outpatient Prescriptions  Medication Sig Dispense Refill  . ibuprofen (ADVIL,MOTRIN) 200 MG tablet Take 400 mg by mouth every 8 (eight) hours.    Marland Kitchen albuterol (PROVENTIL HFA;VENTOLIN HFA) 108 (90 BASE) MCG/ACT inhaler Inhale 2 puffs into the lungs every 6 (six) hours as needed for wheezing or shortness of breath.    . ALPRAZolam (XANAX) 0.5 MG tablet Take 0.5 mg by mouth 3 (three) times daily as needed for anxiety.    Marland Kitchen amoxicillin (AMOXIL) 500 MG capsule Take 500 mg by mouth 3 (three) times daily.    . calcium carbonate (TUMS - DOSED IN MG ELEMENTAL CALCIUM) 500 MG chewable tablet Chew 1 tablet by mouth 3 (three) times daily with meals. Reported on 12/07/2015    . celecoxib (CELEBREX) 200 MG capsule TAKE ONE CAPSULE BY MOUTH TWICE DAILY 60 capsule 2  . hydrochlorothiazide (MICROZIDE) 12.5 MG capsule Take 12.5 mg by mouth daily. Reported on 12/07/2015    . pantoprazole (PROTONIX) 40 MG tablet Take 1 tablet (40 mg total) by mouth daily. 90 tablet 3  . tamoxifen (NOLVADEX) 20 MG tablet Take 1 tablet (20 mg total) by mouth daily. 30 tablet 3   No current facility-administered medications for this visit.     Review of Systems  Constitutional: Negative.   HENT: Negative.   Eyes: Negative.   Respiratory: Positive for shortness of breath.   Cardiovascular: Negative.   Gastrointestinal:       Acid reflux  Genitourinary: Negative.   Musculoskeletal: Negative.   Skin: Negative.   Neurological: Negative.   Endo/Heme/Allergies: Negative.   Psychiatric/Behavioral:       Mood swings secondary to tamoxifen   All other systems reviewed and are negative. 14 point review of systems was performed and is  negative  except as detailed under history of present illness and above  PHYSICAL EXAMINATION: ECOG PERFORMANCE STATUS: 0 - Asymptomatic  Vitals:   09/12/16 0950  BP: 137/69  Pulse: 65  Resp: 20  Temp: 98 F (36.7 C)   Filed Weights   09/12/16 0950  Weight: 263 lb 3.2 oz (119.4 kg)    Physical Exam  Constitutional: She is oriented to person, place, and time and well-developed, well-nourished, and in no distress.  HENT:  Head: Normocephalic and atraumatic.  Eyes: EOM are normal. Pupils are equal, round, and reactive to light.  Neck: Normal range of motion. Neck supple.  Cardiovascular: Normal rate, regular rhythm and normal heart sounds.   Pulmonary/Chest: Effort normal and breath sounds normal.    Abdominal: Soft. Bowel sounds are normal.  Musculoskeletal: Normal range of motion.  Lymphadenopathy:    She has no axillary adenopathy.  Neurological: She is alert and oriented to person, place, and time. Gait normal.  Skin: Skin is warm and dry.  Nursing note and vitals reviewed.  LABORATORY DATA:  I have reviewed the data as listed Lab Results  Component Value Date   WBC 6.2 09/12/2016   HGB 15.2 (H) 09/12/2016   HCT 44.6 09/12/2016   MCV 89.6 09/12/2016   PLT 169 09/12/2016     Chemistry      Component Value Date/Time   NA 138 03/15/2016 1032   K 4.8 03/15/2016 1032   CL 103 03/15/2016 1032   CO2 30 03/15/2016 1032   BUN 20 03/15/2016 1032   CREATININE 0.88 03/15/2016 1032      Component Value Date/Time   CALCIUM 8.9 03/15/2016 1032   ALKPHOS 48 03/15/2016 1032   AST 21 03/15/2016 1032   ALT 20 03/15/2016 1032   BILITOT 1.7 (H) 03/15/2016 1032     PATHOLOGY:   RADIOLOGY: I have reviewed the above documentation for accuracy and completeness, and I agree with the above.  Diagnostic Mammogram Bilateral 08/25/2015 IMPRESSION: No mammographic evidence of malignancy in either breast, with postsurgical/ post treatment changes seen in the left  breast sonographically.  ASSESSMENT & PLAN:  Stage II, infiltrating ductal carcinoma of the left breast, ER positive, PR positive, HER-2 negative Anxiety Tobacco abuse Insomnia, difficulty falling asleep Constipation DEXA  07/01/2015 with osteopenia HTN Leg/Back pain Obesity  Clinically NED on exam. Continue Tamoxifen tolerating well.  I recommended yearly eye exams as well as yearly gynecological exams. She will let me know if she has any leg pain/swelling to r/o DVT while on tamoxifen..   Order Chest CT w/o Contrast for her continued SOB, even after stopping smoking, and also as a screening lung cancer screening. She has a long smoking history (1.5 ppd, 30 years) and COPD. She is concerned about lung cancer.  Annual mammogram this month to [facility] in [place].    She will return for follow up in 6 months with CBC and CMP  This note was signed electronically.    This document serves as a record of services personally performed by Twana First, MD. It was created on her behalf by Martinique Casey, a trained medical scribe. The creation of this record is based on the scribe's personal observations and the provider's statements to them. This document has been checked and approved by the attending provider.  I have reviewed the above documentation for accuracy and completeness, and I agree with the above.

## 2016-09-19 ENCOUNTER — Ambulatory Visit (HOSPITAL_COMMUNITY): Payer: BLUE CROSS/BLUE SHIELD

## 2016-10-07 ENCOUNTER — Ambulatory Visit
Admission: RE | Admit: 2016-10-07 | Discharge: 2016-10-07 | Disposition: A | Discharge status: Home / Self Care | Payer: BLUE CROSS/BLUE SHIELD | Source: Ambulatory Visit | Attending: Hematology & Oncology | Admitting: Hematology & Oncology

## 2016-10-07 DIAGNOSIS — Z853 Personal history of malignant neoplasm of breast: Secondary | ICD-10-CM

## 2016-10-07 IMAGING — MG 2D DIGITAL DIAGNOSTIC BILATERAL MAMMOGRAM WITH CAD AND ADJUNCT T
8 of 14 series · 8 of 30 positions shown · non-contrast
Comparison: Previous exam(s).

CLINICAL DATA: History of treated left breast cancer, status post
lumpectomy, chemo and radiation therapy in [LU].

EXAM:
2D DIGITAL DIAGNOSTIC BILATERAL MAMMOGRAM WITH CAD AND ADJUNCT TOMO

[L MLO (1 of 2)]
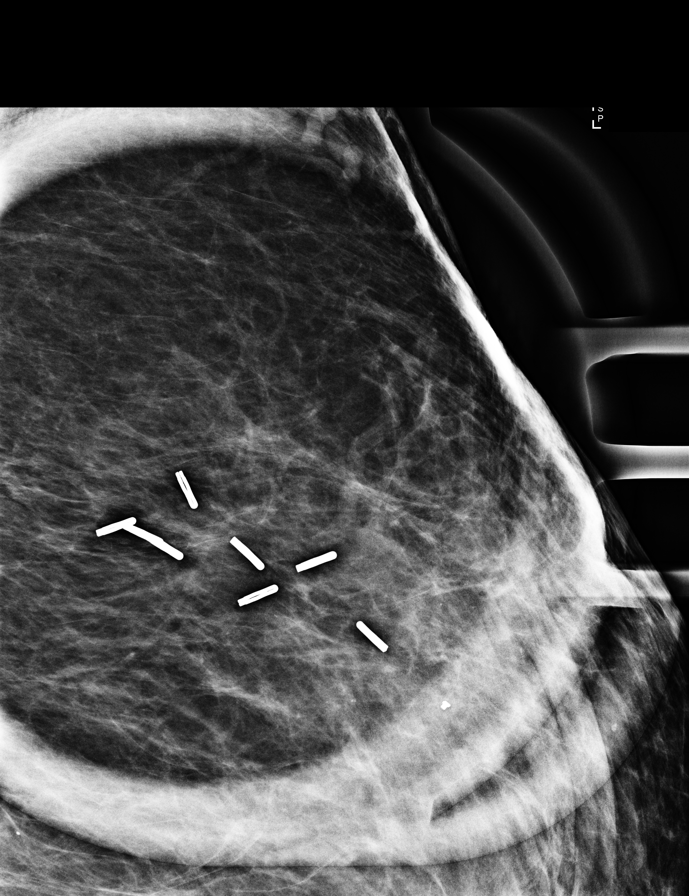

[R MLO]
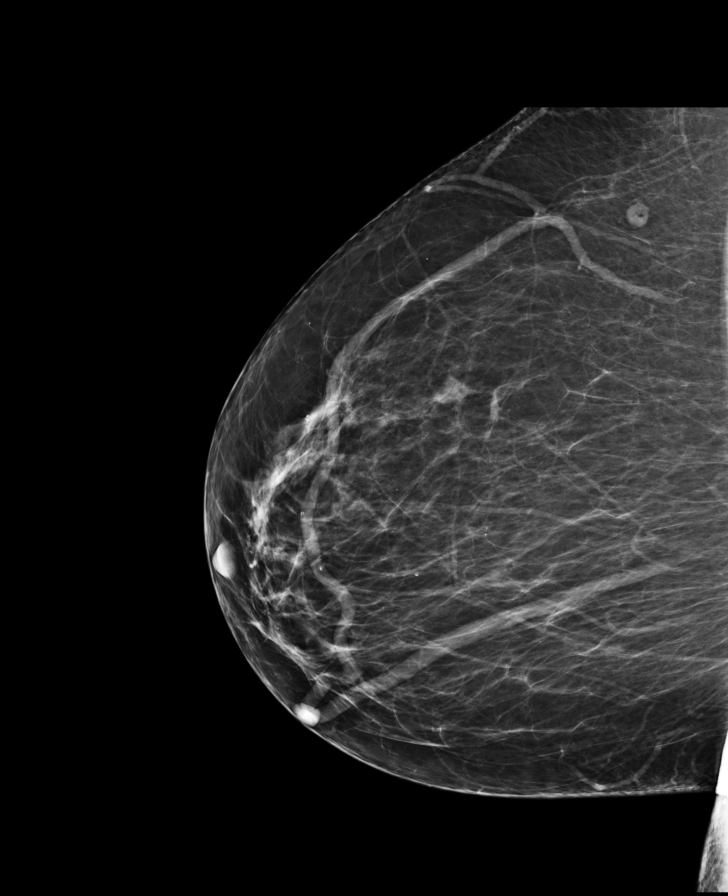

[L MLO (2 of 2)]
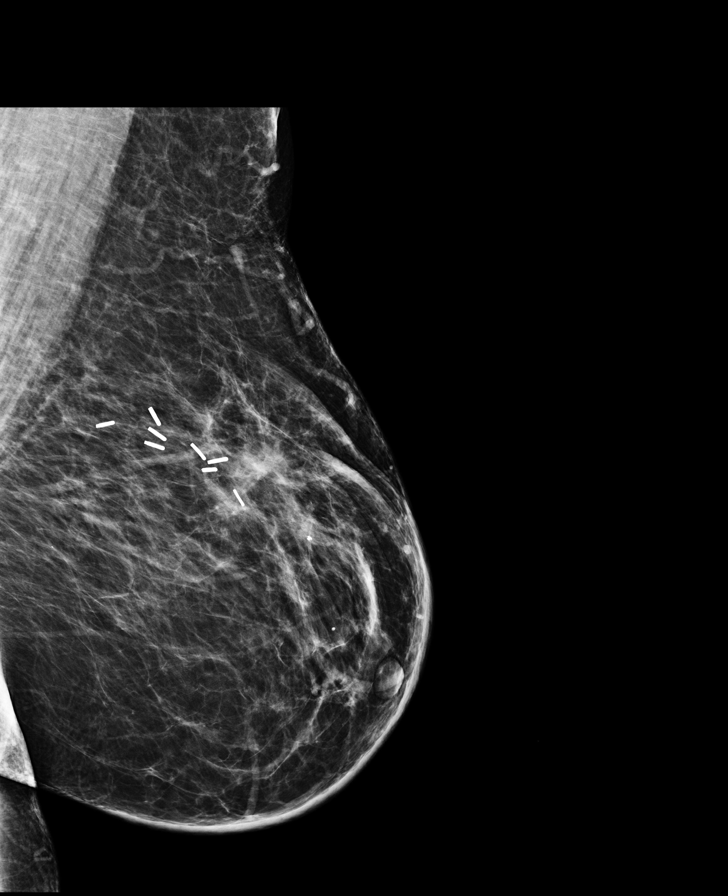

[L MLO synth-2D]
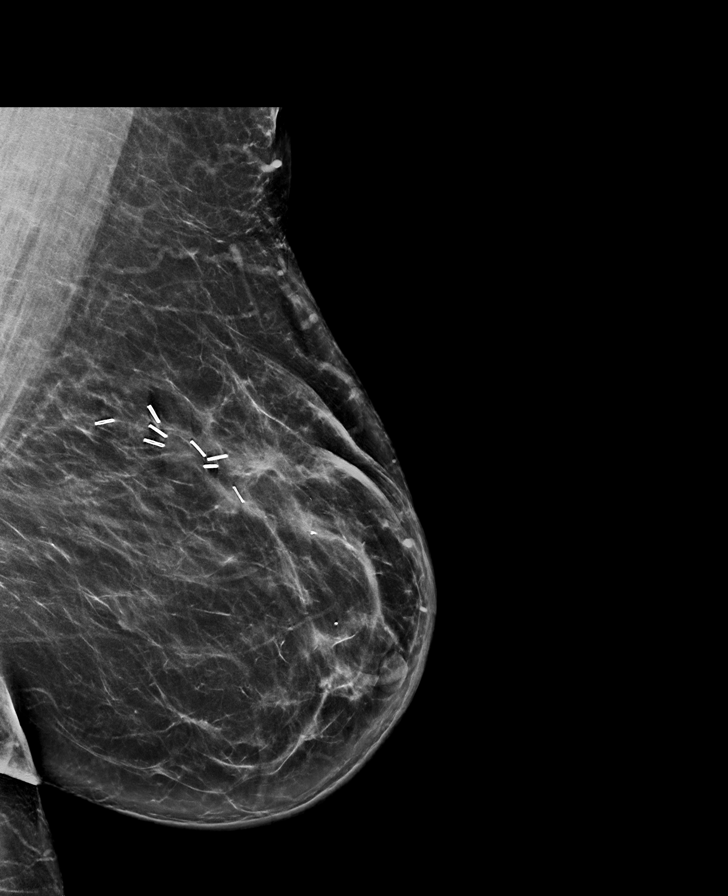

[L CC]
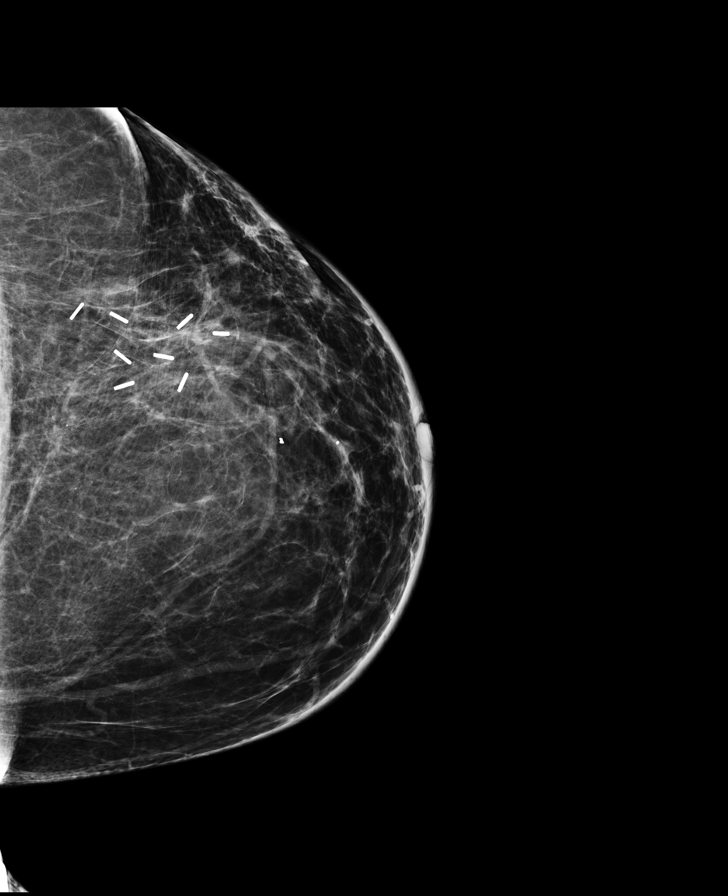

[R CC]
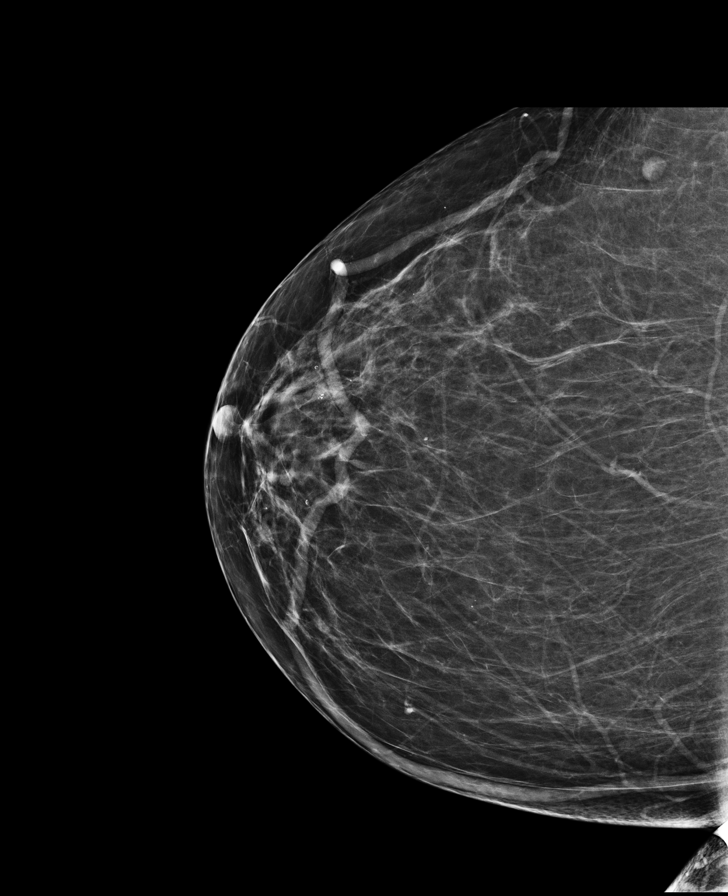

[R CC synth-2D]
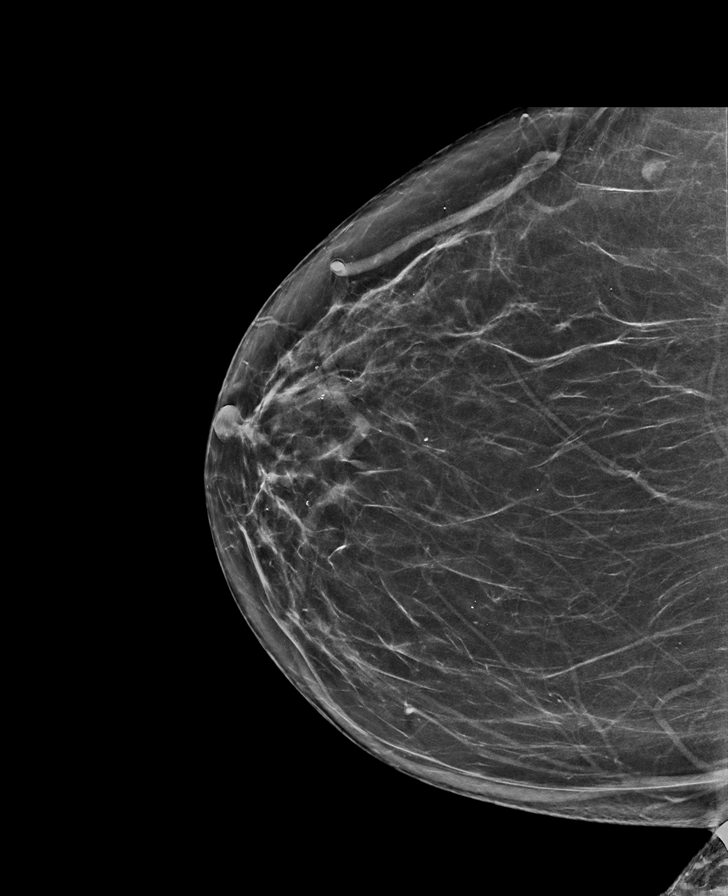

[L CC synth-2D]
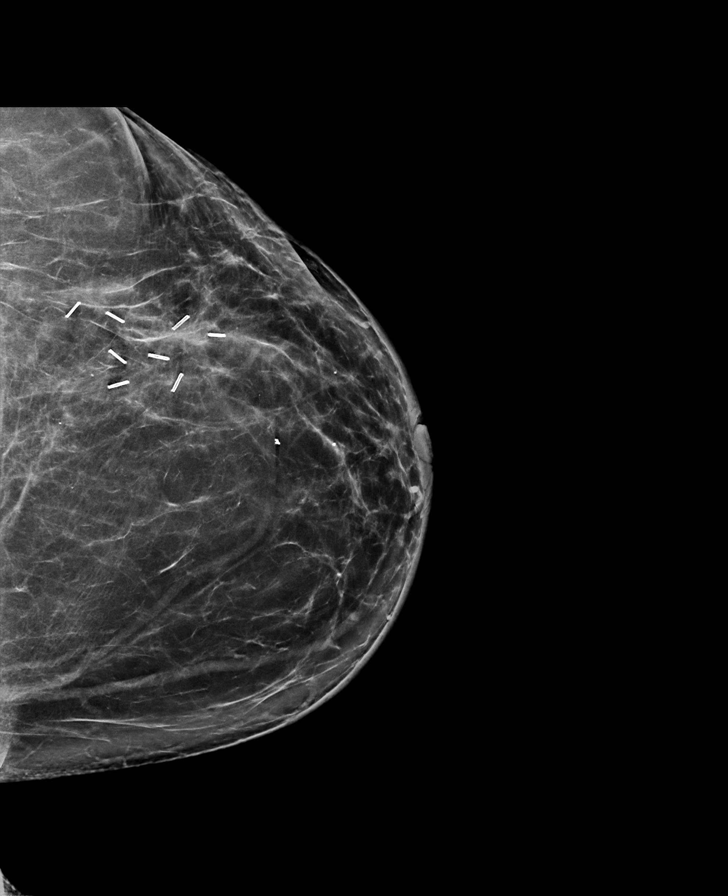

[8 of 30 positions shown; findings below may reference images not displayed]

ACR Breast Density Category b: There are scattered areas of
fibroglandular density.
FINDINGS: Mammographically, there are no suspicious masses, areas of
nonsurgical architectural distortion or clustered
microcalcifications in either breast. There are stable postsurgical
and posttreatment changes in the left breast.

Mammographic images were processed with CAD.
IMPRESSION: No mammographic evidence of malignancy in either breast, status post
left lumpectomy.

RECOMMENDATION:
Diagnostic mammogram is suggested in 1 year. (Code:[LU])

I have discussed the findings and recommendations with the patient.
Results were also provided in writing at the conclusion of the
visit. If applicable, a reminder letter will be sent to the patient
regarding the next appointment.

BI-RADS CATEGORY  2: Benign.

## 2016-12-17 ENCOUNTER — Other Ambulatory Visit (HOSPITAL_COMMUNITY): Payer: Self-pay | Admitting: Hematology & Oncology

## 2016-12-17 DIAGNOSIS — C50412 Malignant neoplasm of upper-outer quadrant of left female breast: Secondary | ICD-10-CM

## 2017-01-18 ENCOUNTER — Other Ambulatory Visit (HOSPITAL_COMMUNITY): Payer: Self-pay | Admitting: Pulmonary Disease

## 2017-01-18 DIAGNOSIS — R102 Pelvic and perineal pain: Secondary | ICD-10-CM

## 2017-01-18 DIAGNOSIS — R109 Unspecified abdominal pain: Secondary | ICD-10-CM

## 2017-01-23 ENCOUNTER — Ambulatory Visit (HOSPITAL_COMMUNITY): Payer: BLUE CROSS/BLUE SHIELD

## 2017-01-23 ENCOUNTER — Ambulatory Visit (HOSPITAL_COMMUNITY): Admission: RE | Admit: 2017-01-23 | Payer: BLUE CROSS/BLUE SHIELD | Source: Ambulatory Visit

## 2017-01-31 ENCOUNTER — Ambulatory Visit (HOSPITAL_COMMUNITY)
Admission: RE | Admit: 2017-01-31 | Discharge: 2017-01-31 | Disposition: A | Discharge status: Home / Self Care | Payer: Medicare HMO | Source: Ambulatory Visit | Attending: Pulmonary Disease | Admitting: Pulmonary Disease

## 2017-01-31 DIAGNOSIS — K760 Fatty (change of) liver, not elsewhere classified: Secondary | ICD-10-CM | POA: Insufficient documentation

## 2017-01-31 DIAGNOSIS — R102 Pelvic and perineal pain: Secondary | ICD-10-CM | POA: Insufficient documentation

## 2017-01-31 DIAGNOSIS — R109 Unspecified abdominal pain: Secondary | ICD-10-CM | POA: Diagnosis present

## 2017-01-31 DIAGNOSIS — R160 Hepatomegaly, not elsewhere classified: Secondary | ICD-10-CM | POA: Insufficient documentation

## 2017-03-15 ENCOUNTER — Encounter (HOSPITAL_COMMUNITY): Payer: Medicare HMO | Attending: Oncology | Admitting: Oncology

## 2017-03-15 ENCOUNTER — Encounter (HOSPITAL_COMMUNITY): Payer: Self-pay | Admitting: Oncology

## 2017-03-15 ENCOUNTER — Encounter (HOSPITAL_COMMUNITY): Payer: Medicare HMO | Attending: Oncology

## 2017-03-15 VITALS — BP 145/75 | HR 79 | Resp 18 | Ht 64.0 in | Wt 256.0 lb

## 2017-03-15 DIAGNOSIS — E876 Hypokalemia: Secondary | ICD-10-CM

## 2017-03-15 DIAGNOSIS — Z720 Tobacco use: Secondary | ICD-10-CM

## 2017-03-15 DIAGNOSIS — F329 Major depressive disorder, single episode, unspecified: Secondary | ICD-10-CM

## 2017-03-15 DIAGNOSIS — Z79811 Long term (current) use of aromatase inhibitors: Secondary | ICD-10-CM | POA: Diagnosis not present

## 2017-03-15 DIAGNOSIS — C50412 Malignant neoplasm of upper-outer quadrant of left female breast: Secondary | ICD-10-CM

## 2017-03-15 LAB — COMPREHENSIVE METABOLIC PANEL
ALT: 18 U/L (ref 14–54)
AST: 20 U/L (ref 15–41)
Albumin: 3.6 g/dL (ref 3.5–5.0)
Alkaline Phosphatase: 50 U/L (ref 38–126)
Anion gap: 7 (ref 5–15)
BUN: 11 mg/dL (ref 6–20)
CO2: 30 mmol/L (ref 22–32)
Calcium: 9.7 mg/dL (ref 8.9–10.3)
Chloride: 99 mmol/L — ABNORMAL LOW (ref 101–111)
Creatinine, Ser: 0.86 mg/dL (ref 0.44–1.00)
GFR calc Af Amer: >60 mL/min (ref >60)
GFR calc non Af Amer: >60 mL/min (ref >60)
Glucose, Bld: 139 mg/dL — ABNORMAL HIGH (ref 65–99)
Potassium: 2.8 mmol/L — ABNORMAL LOW (ref 3.5–5.1)
Sodium: 136 mmol/L (ref 135–145)
Total Bilirubin: 1.2 mg/dL (ref 0.3–1.2)
Total Protein: 6.7 g/dL (ref 6.5–8.1)

## 2017-03-15 LAB — CBC WITH DIFFERENTIAL/PLATELET
Basophils Absolute: 0 10*3/uL (ref 0.0–0.1)
Basophils Relative: 0 %
Eosinophils Absolute: 0.2 10*3/uL (ref 0.0–0.7)
Eosinophils Relative: 2 %
HCT: 48.2 % — ABNORMAL HIGH (ref 36.0–46.0)
Hemoglobin: 16.6 g/dL — ABNORMAL HIGH (ref 12.0–15.0)
Lymphocytes Relative: 21 %
Lymphs Abs: 1.4 10*3/uL (ref 0.7–4.0)
MCH: 30.7 pg (ref 26.0–34.0)
MCHC: 34.4 g/dL (ref 30.0–36.0)
MCV: 89.3 fL (ref 78.0–100.0)
Monocytes Absolute: 0.4 10*3/uL (ref 0.1–1.0)
Monocytes Relative: 5 %
Neutro Abs: 4.9 10*3/uL (ref 1.7–7.7)
Neutrophils Relative %: 72 %
Platelets: 172 10*3/uL (ref 150–400)
RBC: 5.4 MIL/uL — ABNORMAL HIGH (ref 3.87–5.11)
RDW: 12.5 % (ref 11.5–15.5)
WBC: 6.8 10*3/uL (ref 4.0–10.5)

## 2017-03-15 MED ORDER — POTASSIUM CHLORIDE CRYS ER 20 MEQ PO TBCR: 40.0000 meq | EXTENDED_RELEASE_TABLET | Freq: Every day | ORAL | 0 refills | Status: DC

## 2017-03-15 NOTE — Progress Notes (Signed)
[place] Progress Note  Patient Care Team: Sinda Du, MD as PCP - General (Internal Medicine) Whitney Muse, Kelby Fam, MD as Consulting Physician (Hematology and Oncology) Erroll Luna, MD as Consulting Physician (General Surgery) Thea Silversmith, MD (Inactive) as Consulting Physician (Radiation Oncology) Sylvan Cheese, NP as Nurse Practitioner (Hematology and Oncology) Danie Binder, MD as Consulting Physician (Gastroenterology)  CHIEF COMPLAINTS/PURPOSE OF CONSULTATION:  L breast cancer History of L breast core biopsy for microcalcifications in 2014, fibroadenoma. CMF cycle 1 on 10/02/2014 No matching staging information was found for the patient.  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       06/24/2014 Breast US    Left breast: ill-defined predominantly hypoechoic area with some increased echogenicity in the 2 o'clock position of the left breast, 7 cm from the nipple. 2.6 x 2.3 x 1.8 cm in maximum dimensions.      07/01/2014 Initial Biopsy    Left breast core needle bx: Invasive mammary carcinoma with lobular features, ER+ (100%), PR+ (91%), HER2/neu negative (ratio 1.09), Ki-67 32%. E-cadherin strongly positive, diagnostic for IDC       07/01/2014 Clinical Stage    Stage IIA: T2 N0      08/20/2014 Definitive Surgery    Left breast seed localized lumpectomy/SLNB: IDC, +LVI, DCIS, 1 LN removed and positive for metastatic carcinoma. Grade 2. HER2/neu repeated and negative (ratio 0.69-1.9).       08/20/2014 Pathologic Stage   Stage IIB: pT2 pN1a pMx      09/17/2014 Surgery    Port-a cath placement and re-excision      10/02/2014 - 01/15/2015 Chemotherapy    CMF x 6 cycles.  patient refused Taxane-containing chemotherapy.      03/07/2015 Procedure    Comp Cancer Panel reveals no variant at APC, ATM, AXIN2, BARD1, BMPR1A, BRCA1, BRCA2, BRIP1, CDH1, CDK4, CDKN2A, CHEK2, FANCC, MLH1, MSH2, MSH6, MUTYH, NBN, PALB2, PMS2, POLD1, POLE, PTEN, RAD51C, RAD51D, SMAD4, STK11, TP53, VHL, and XRCC2.      04/06/2015 - 05/21/2015 Radiation Therapy    Adjuvant RT Pablo Ledger): Left breast/ 45 Gy over 25 fractions.  Left supraclavicular fossa and axilla/ 45 Gy over 25 fractions. Left breast boost/ 16 Gy over 8 fractions. Total dose: 61 Gy      06/04/2015 - 08/07/2015 Anti-estrogen oral therapy    Exemestane 25 mg daily.  Planned duration of therapy 5 years.       07/23/2015 Survivorship    Survivorship care plan completed and mailed to patient in lieu of in person visit      08/06/2015 Adverse Reaction    Severe hot flashes and mood swings      08/08/2015 - 12/07/2015 Anti-estrogen oral therapy    Arimidex      12/04/2015 Adverse Reaction    Worsening depression, patient stopped Arimidex      12/07/2015 -  Anti-estrogen oral therapy    Tamoxifen       HISTORY OF PRESENTING ILLNESS:  Regina Mcmahon 66 y.o. female is here  for follow-up of a stage II ER positive PR positive HER-2 negative carcinoma the left breast.   Patient presents today for continued follow-up. She had bilateral mammogram performed in March 2018 which was negative for malignancy. She states she has not palpated any new masses on her breasts. Patient feels like her depression is getting worse. She started smoking again and is currently smoking 2 packs per day. She denies any chest pain, shortness breath, abdominal pain, nausea, diarrhea, vomiting, focal weakness. She continues take her tamoxifen daily.  MEDICAL HISTORY:  Past Medical History:    Diagnosis Date  . Asthma   . Asymptomatic varicose veins   . Breast cancer ([place]) 06/2014   left  . Chronic airway obstruction ([place])   . COPD (chronic obstructive pulmonary disease) ([place])   . Depression   . Hemorrhage rectum    and anus.  . Hypertension   . Insomnia   . Other symptoms involving digestive system(787.99)   . Pain    Hx.pain in joint involving pelvic region and thigh; pain in limb  . Palpitations   . Panic attacks   . Sinusitis   . Symptomatic menopausal or female climacteric states     SURGICAL HISTORY: Past Surgical History:  Procedure Laterality Date  . CESAREAN SECTION    . CHOLECYSTECTOMY  1985  . COLONOSCOPY  2002   Dr. Lindalou Hose: internal hemorrhoids, one small rectal polyp, adenomatous path   . COLONOSCOPY N/A 11/23/2015   Procedure: COLONOSCOPY;  Surgeon: Danie Binder, MD;  [facility];  Service: Endoscopy;  Laterality: N/A;  1100 - moved to 10:45 - office to notify  . ESOPHAGOGASTRODUODENOSCOPY N/A 11/23/2015   Procedure: ESOPHAGOGASTRODUODENOSCOPY (EGD);  Surgeon: Danie Binder, MD;  [facility];  Service: Endoscopy;  Laterality: N/A;  . PORT-A-CATH REMOVAL  02/2015  . PORTACATH PLACEMENT Right 09/17/2014   Procedure: INSERTION PORT-A-CATH WITH ULTRASOUND;  Surgeon: Erroll Luna, MD;  Location: [place] [place];  Service: General;  Laterality: Right;  IJ  . RADIOACTIVE SEED GUIDED MASTECTOMY WITH AXILLARY SENTINEL LYMPH NODE BIOPSY Left 08/20/2014   Procedure: LEFT BREAST LUMPECTOMY WITH RADIOACTIVE SEED LOCALIZATION AND SENTINEL LYMPH NODE MAPPING;  Surgeon: Erroll Luna, MD;  Location: [place];  Service: General;  Laterality: Left;  . RE-EXCISION OF BREAST LUMPECTOMY Left 09/17/2014   Procedure: RE-EXCISION OF BREAST LUMPECTOMY;  Surgeon: Erroll Luna, MD;  Location: [place];  Service: General;  Laterality: Left;  . TUBAL LIGATION      SOCIAL HISTORY: Social History   Social  History  . Marital status: Divorced    Spouse name: N/A  . Number of children: 2  . Years of education: N/A   Occupational History  . Not on file.   Social History Main Topics  . Smoking status: Former Smoker    Packs/day: 1.50    Types: Cigarettes    Quit date: 02/03/2016  . Smokeless tobacco: Never Used  . Alcohol use No  . Drug use: No  . Sexual activity: No   Other Topics Concern  . Not on file   Social History Narrative  . No narrative on file  Smokes 2 PPD   FAMILY HISTORY: Family History  Problem Relation Age of Onset  . Diabetes Mother   . Ovarian cancer Mother 41  . Other Father        died in MVA at age 44  . Other Sister        mitral  valve disorder  . Melanoma Sister 54       removed from back of leg  . Colon cancer Brother        early 36s, succumbed to disease  . Cancer Paternal Aunt        leukemia, bone, or lung cancer  . Alzheimer's disease Maternal Grandmother   . Heart attack Maternal Grandfather   . Heart attack Paternal Grandmother   . COPD Paternal Grandfather   . Emphysema Paternal Grandfather   . Congestive Heart Failure Paternal Aunt   . Stomach cancer Paternal Uncle   . Kidney cancer Maternal Uncle   . Cancer Cousin        dx. teens/dx. 40s  . Cancer Cousin   . Colon cancer Cousin    indicated that her mother is deceased. She indicated that her father is deceased. She indicated that her sister is alive. She indicated that her brother is deceased. She indicated that her maternal grandmother is deceased. She indicated that her maternal grandfather is deceased. She indicated that her paternal grandmother is deceased. She indicated that her paternal grandfather is deceased. She indicated that her daughter is alive. She indicated that her son is alive. She indicated that only one of her two maternal aunts is alive. She indicated that only one of her three maternal uncles is alive. She indicated that only one of her three paternal aunts is  alive. She indicated that both of her paternal uncles are deceased. She indicated that both of her cousins are deceased.    ALLERGIES:  has No Known Allergies.  MEDICATIONS:  Current Outpatient Prescriptions  Medication Sig Dispense Refill  . albuterol (PROVENTIL HFA;VENTOLIN HFA) 108 (90 BASE) MCG/ACT inhaler Inhale 2 puffs into the lungs every 6 (six) hours as needed for wheezing or shortness of breath.    . ALPRAZolam (XANAX) 0.5 MG tablet Take 0.5 mg by mouth 3 (three) times daily as needed for anxiety.    Marland Kitchen amoxicillin (AMOXIL) 500 MG capsule Take 500 mg by mouth 3 (three) times daily.    . calcium carbonate (TUMS - DOSED IN MG ELEMENTAL CALCIUM) 500 MG chewable tablet Chew 1 tablet by mouth 3 (three) times daily with meals. Reported on 12/07/2015    . hydrochlorothiazide (MICROZIDE) 12.5 MG capsule Take 12.5 mg by mouth daily. Reported on 12/07/2015    . ibuprofen (ADVIL,MOTRIN) 200 MG tablet Take 400 mg by mouth every 8 (eight) hours.    . pantoprazole (PROTONIX) 40 MG tablet Take 1 tablet (40 mg total) by mouth daily. 90 tablet 3  . tamoxifen (NOLVADEX) 20 MG tablet TAKE ONE TABLET BY MOUTH ONCE DAILY 30 tablet 3   No current facility-administered medications for this visit.     Review of Systems  Constitutional: Positive for malaise/fatigue.  HENT: Negative.   Eyes: Negative.   Respiratory: Negative for shortness of breath.   Cardiovascular: Negative.   Gastrointestinal: Negative.   Genitourinary: Negative.   Musculoskeletal: Negative.   Skin: Negative.   Neurological: Negative.   Endo/Heme/Allergies: Negative.   Psychiatric/Behavioral: Positive for depression.  All other systems reviewed and are negative. 14 point review of systems was performed and is negative except as detailed under history of present illness and above  PHYSICAL EXAMINATION: ECOG PERFORMANCE STATUS: 0 - Asymptomatic  Vitals:   03/15/17 1124  BP: (!) 145/75  Pulse: 79  Resp: 18  SpO2: 95%    Filed Weights   03/15/17 1124  Weight: 256 lb (116.1 kg)  Physical Exam  Constitutional: She is oriented to person, place, and time and well-developed, well-nourished, and in no distress.  HENT:  Head: Normocephalic and atraumatic.  Eyes: Pupils are equal, round, and reactive to light. EOM are normal.  Neck: Normal range of motion. Neck supple.  Cardiovascular: Normal rate, regular rhythm and normal heart sounds.   Pulmonary/Chest: Effort normal and breath sounds normal.    Abdominal: Soft. Bowel sounds are normal.  Musculoskeletal: Normal range of motion.  Lymphadenopathy:    She has no axillary adenopathy.  Neurological: She is alert and oriented to person, place, and time. Gait normal.  Skin: Skin is warm and dry.  Nursing note and vitals reviewed.  LABORATORY DATA:  I have reviewed the data as listed Lab Results  Component Value Date   WBC 6.8 03/15/2017   HGB 16.6 (H) 03/15/2017   HCT 48.2 (H) 03/15/2017   MCV 89.3 03/15/2017   PLT 172 03/15/2017     Chemistry      Component Value Date/Time   NA 136 03/15/2017 1041   K 2.8 (L) 03/15/2017 1041   CL 99 (L) 03/15/2017 1041   CO2 30 03/15/2017 1041   BUN 11 03/15/2017 1041   CREATININE 0.86 03/15/2017 1041      Component Value Date/Time   CALCIUM 9.7 03/15/2017 1041   ALKPHOS 50 03/15/2017 1041   AST 20 03/15/2017 1041   ALT 18 03/15/2017 1041   BILITOT 1.2 03/15/2017 1041     PATHOLOGY:   RADIOLOGY: I have reviewed the above documentation for accuracy and completeness, and I agree with the above.  Diagnostic Mammogram Bilateral 08/25/2015 IMPRESSION: No mammographic evidence of malignancy in either breast, with postsurgical/ post treatment changes seen in the left breast sonographically.  ASSESSMENT & PLAN:  Stage II, infiltrating ductal carcinoma of the left breast, ER positive, PR positive, HER-2 negative hypokalemia Anxiety/depression Tobacco abuse Insomnia, difficulty falling  asleep Constipation DEXA  07/01/2015 with osteopenia HTN Leg/Back pain Obesity  Clinically NED on breast exam today. Continue Tamoxifen tolerating well.  I recommended yearly eye exams as well as yearly gynecological exams. She will let me know if she has any leg pain/swelling to r/o DVT while on tamoxifen.  I have sent in a Rx for KDUR 40Meq PO daily to her pharmacy since her potassium is 2.8 today. I recommended for her to see her PCP for follow up labs to make sure her potassium is corrected. Also asked her to see her PCP regarding her worsening depression.  Counseled patient on efforts for smoke cessation.  Annual mammogram to be done at [facility] in [place], next mammogram in March 2019.    She will return for follow up in 6 months with CBC and CMP  This note was signed electronically.    Twana First, MD

## 2017-03-20 ENCOUNTER — Other Ambulatory Visit: Payer: Self-pay | Admitting: Gastroenterology

## 2017-04-19 ENCOUNTER — Other Ambulatory Visit (HOSPITAL_COMMUNITY): Payer: Self-pay | Admitting: Adult Health

## 2017-04-19 DIAGNOSIS — C50412 Malignant neoplasm of upper-outer quadrant of left female breast: Secondary | ICD-10-CM

## 2017-06-30 ENCOUNTER — Other Ambulatory Visit: Payer: Self-pay | Admitting: Nurse Practitioner

## 2017-08-19 ENCOUNTER — Other Ambulatory Visit (HOSPITAL_COMMUNITY): Payer: Self-pay | Admitting: Adult Health

## 2017-08-19 DIAGNOSIS — C50412 Malignant neoplasm of upper-outer quadrant of left female breast: Secondary | ICD-10-CM

## 2017-09-08 IMAGING — US US ABDOMEN COMPLETE
1 series · 14 of 25 positions shown · non-contrast
Comparison: None.

CLINICAL DATA: Abdominal pain

EXAM:
ABDOMEN ULTRASOUND COMPLETE

[Series 1: us abdomen complete · 0.23mm/px · 14 of 86 slices shown]
[im 1/86]
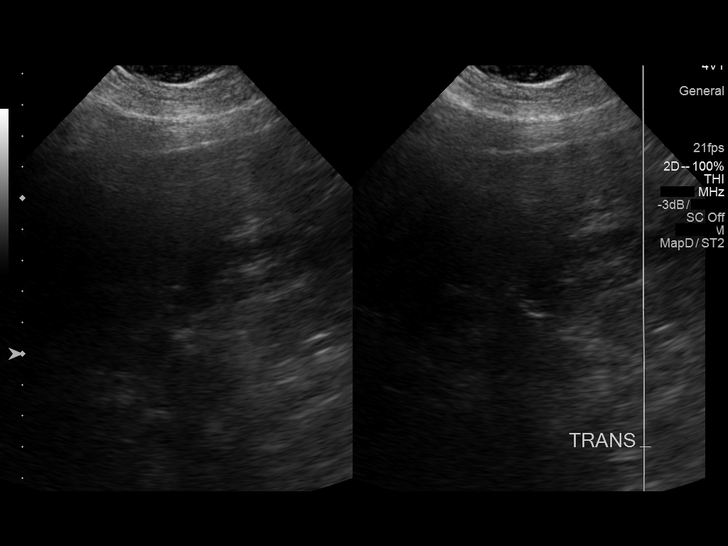
[im 8/86]
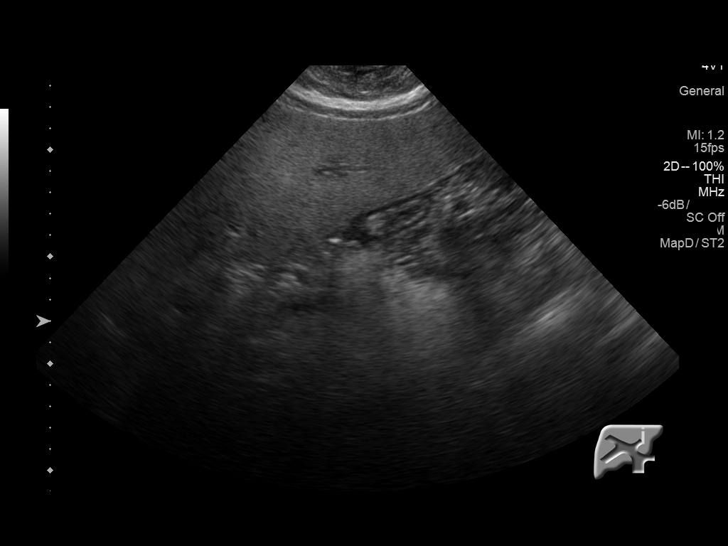
[im 15/86]
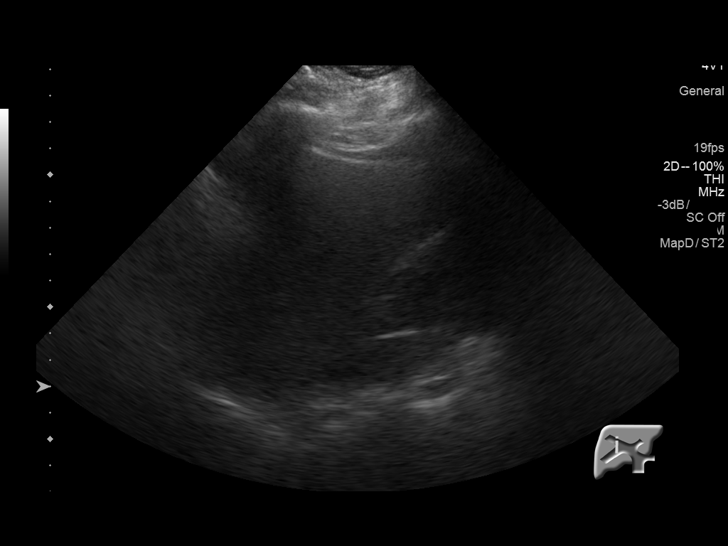
[im 22/86]
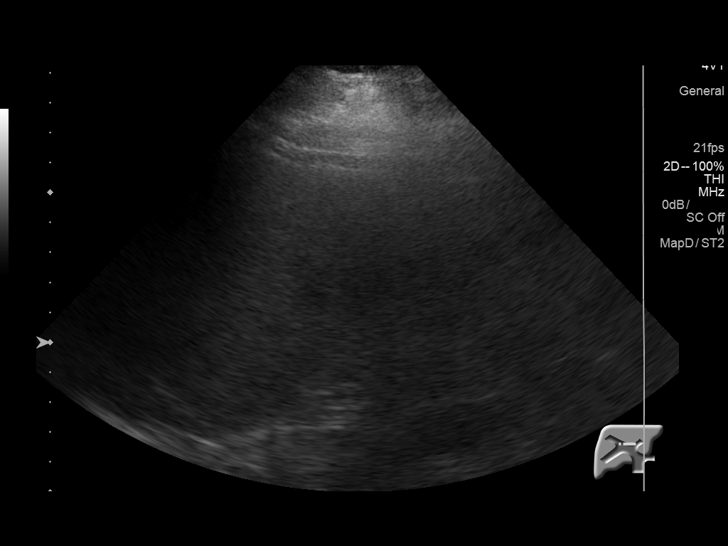
[im 29/86]
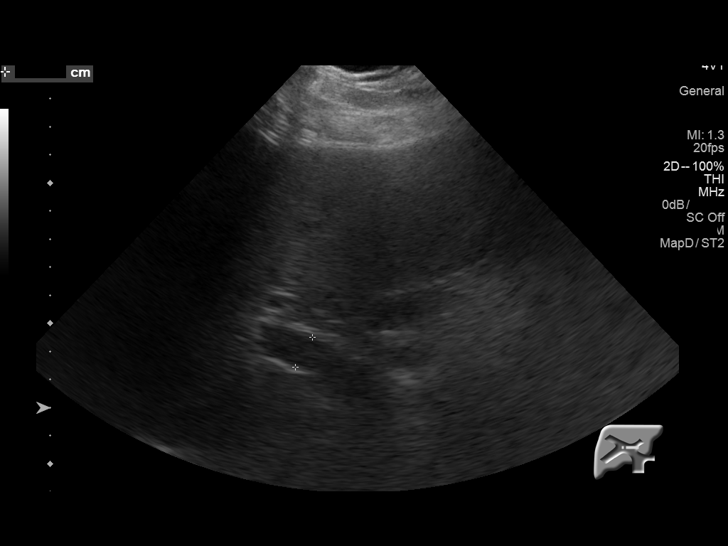
[im 32/86]
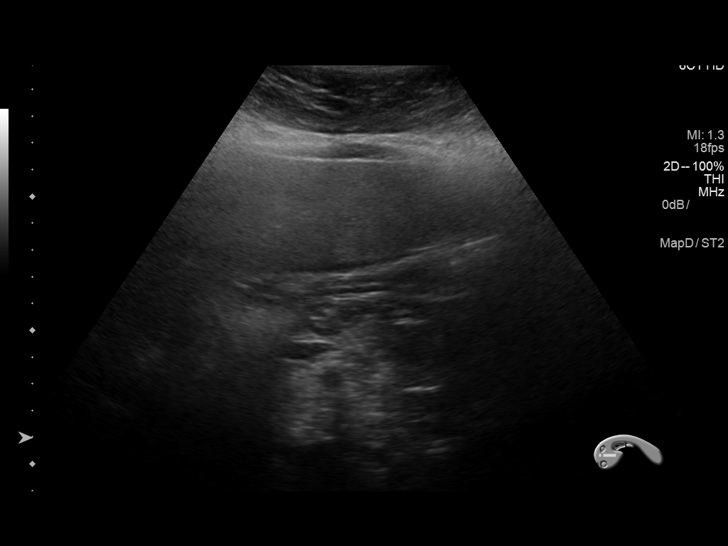
[im 39/86]
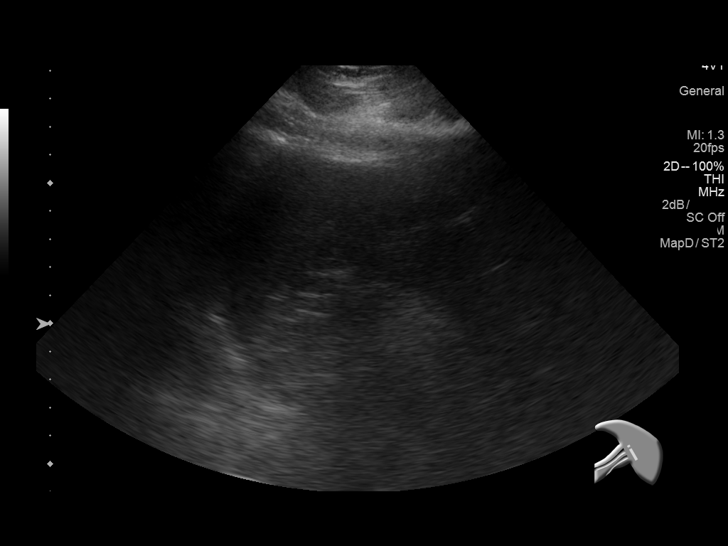
[im 47/86]
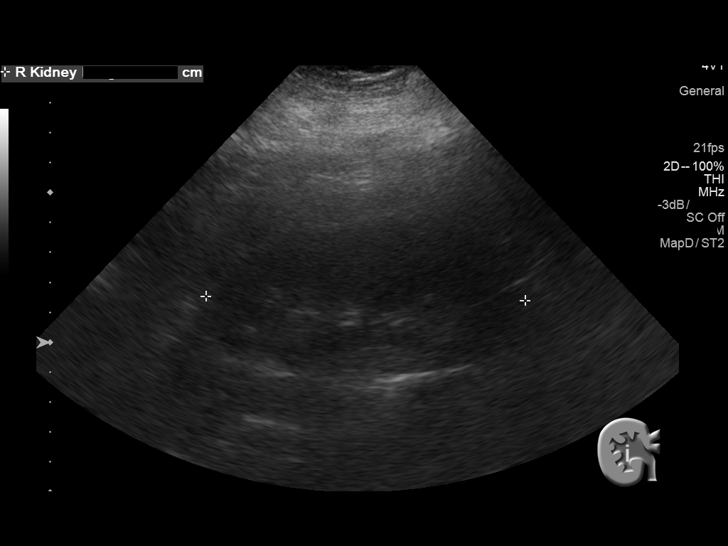
[im 54/86]
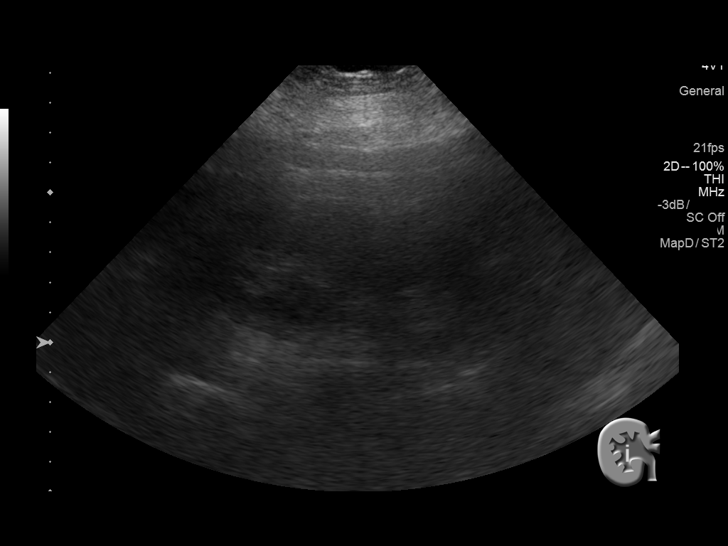
[im 57/86]
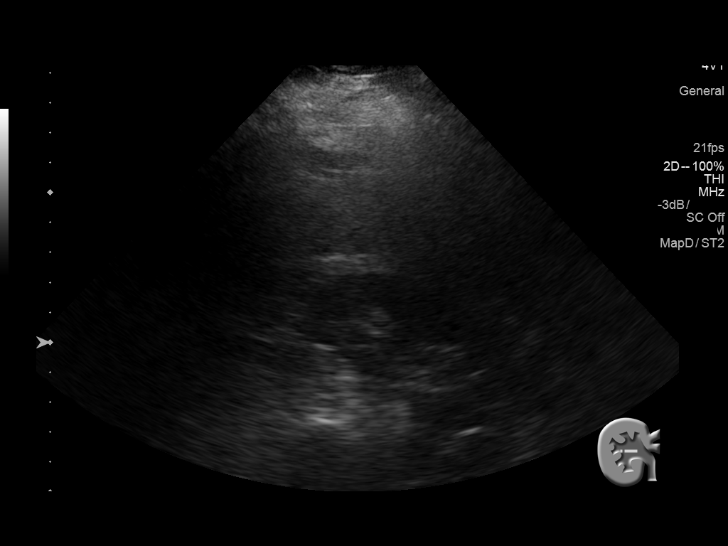
[im 64/86]
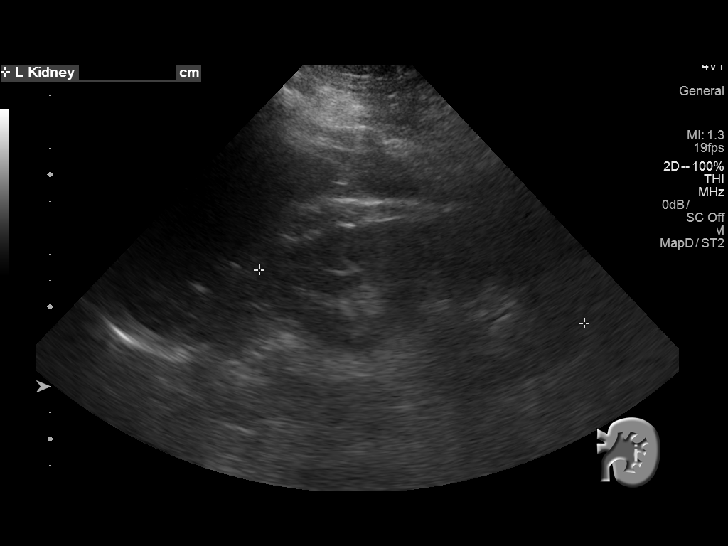
[im 71/86]
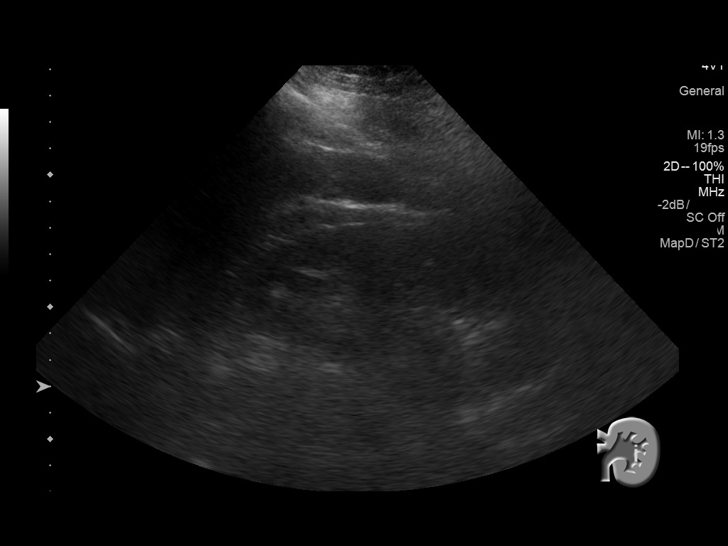
[im 78/86]
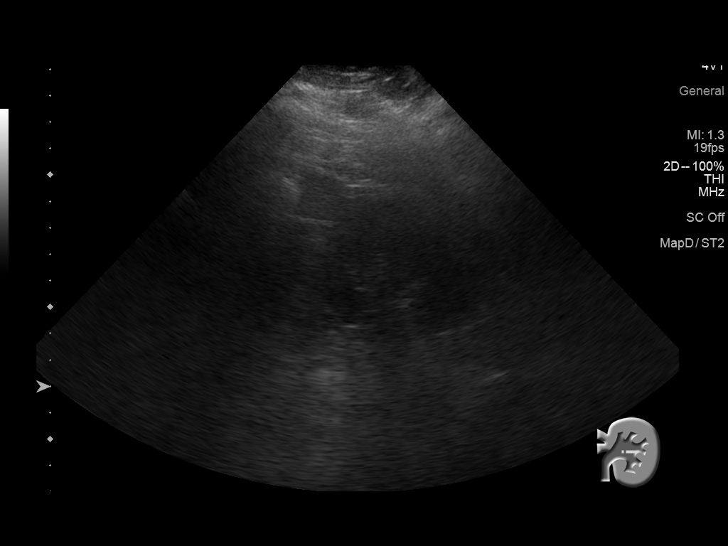
[im 86/86]
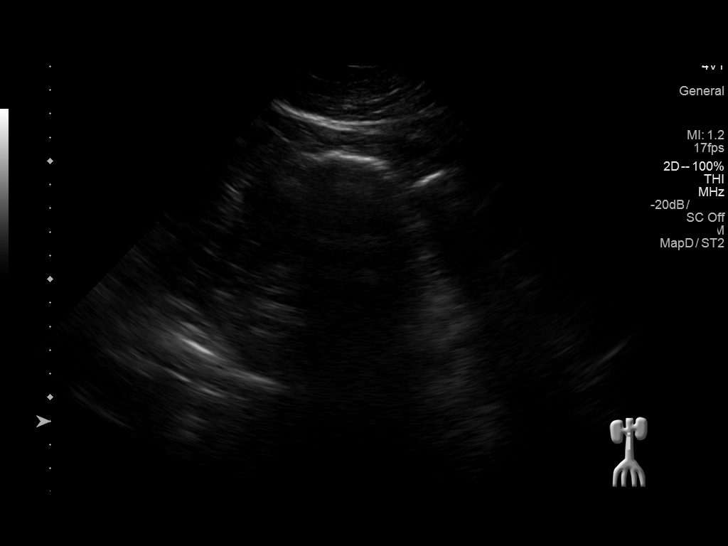

[14 of 25 positions shown; findings below may reference images not displayed]

FINDINGS: Gallbladder: The gallbladder has previously been resected.

Common bile duct: Diameter: The common bile duct measures 8 mm in
diameter, within normal limits post cholecystectomy.

Liver: The liver is prominent and inhomogeneous consistent with
diffuse fatty infiltration and hepatomegaly. No focal hepatic
abnormality is seen.

IVC: No abnormality visualized.

Pancreas: Much of the pancreas is obscured by bowel gas and body
habitus.

Spleen: The spleen measures 8.8 cm.

Right Kidney: Length: 10.7 cm..  No hydronephrosis is seen.

Left Kidney: Length: 12.1 cm..  No hydronephrosis is noted.

Abdominal aorta: The abdominal aorta is not well visualized due to
bowel gas and body habitus.

Other findings: The patient refused pelvic ultrasound.
IMPRESSION: 1. Hepatomegaly and diffuse fatty infiltration of the liver. No
focal hepatic abnormality.
2. Much of the pancreas is obscured.
3. No hydronephrosis.

## 2017-09-12 ENCOUNTER — Other Ambulatory Visit: Payer: Self-pay | Admitting: Adult Health

## 2017-09-12 DIAGNOSIS — Z9889 Other specified postprocedural states: Secondary | ICD-10-CM

## 2017-09-13 ENCOUNTER — Other Ambulatory Visit (HOSPITAL_COMMUNITY): Payer: Self-pay | Admitting: *Deleted

## 2017-09-13 DIAGNOSIS — C50412 Malignant neoplasm of upper-outer quadrant of left female breast: Secondary | ICD-10-CM

## 2017-09-15 ENCOUNTER — Inpatient Hospital Stay (HOSPITAL_COMMUNITY): Payer: Medicare HMO | Attending: Oncology

## 2017-09-15 ENCOUNTER — Encounter (HOSPITAL_COMMUNITY): Payer: Self-pay | Admitting: Lab

## 2017-09-15 ENCOUNTER — Encounter (HOSPITAL_COMMUNITY): Payer: Self-pay | Admitting: Adult Health

## 2017-09-15 ENCOUNTER — Inpatient Hospital Stay (HOSPITAL_BASED_OUTPATIENT_CLINIC_OR_DEPARTMENT_OTHER): Payer: Medicare HMO | Admitting: Adult Health

## 2017-09-15 VITALS — BP 147/84 | HR 108 | Temp 98.6°F | Resp 16 | Wt 247.5 lb

## 2017-09-15 DIAGNOSIS — M7989 Other specified soft tissue disorders: Secondary | ICD-10-CM

## 2017-09-15 DIAGNOSIS — Z780 Asymptomatic menopausal state: Secondary | ICD-10-CM

## 2017-09-15 DIAGNOSIS — Z79811 Long term (current) use of aromatase inhibitors: Secondary | ICD-10-CM | POA: Diagnosis not present

## 2017-09-15 DIAGNOSIS — F329 Major depressive disorder, single episode, unspecified: Secondary | ICD-10-CM | POA: Insufficient documentation

## 2017-09-15 DIAGNOSIS — R531 Weakness: Secondary | ICD-10-CM | POA: Diagnosis not present

## 2017-09-15 DIAGNOSIS — F17210 Nicotine dependence, cigarettes, uncomplicated: Secondary | ICD-10-CM

## 2017-09-15 DIAGNOSIS — C50412 Malignant neoplasm of upper-outer quadrant of left female breast: Secondary | ICD-10-CM

## 2017-09-15 DIAGNOSIS — M8580 Other specified disorders of bone density and structure, unspecified site: Secondary | ICD-10-CM

## 2017-09-15 DIAGNOSIS — Z79899 Other long term (current) drug therapy: Secondary | ICD-10-CM | POA: Insufficient documentation

## 2017-09-15 DIAGNOSIS — Z170 Estrogen receptor positive status [ER+]: Secondary | ICD-10-CM | POA: Diagnosis not present

## 2017-09-15 LAB — COMPREHENSIVE METABOLIC PANEL
ALT: 23 U/L (ref 14–54)
AST: 23 U/L (ref 15–41)
Albumin: 3.7 g/dL (ref 3.5–5.0)
Alkaline Phosphatase: 58 U/L (ref 38–126)
Anion gap: 10 (ref 5–15)
BUN: 16 mg/dL (ref 6–20)
CO2: 27 mmol/L (ref 22–32)
Calcium: 9.6 mg/dL (ref 8.9–10.3)
Chloride: 102 mmol/L (ref 101–111)
Creatinine, Ser: 0.9 mg/dL (ref 0.44–1.00)
GFR calc Af Amer: >60 mL/min (ref >60)
GFR calc non Af Amer: >60 mL/min (ref >60)
Glucose, Bld: 159 mg/dL — ABNORMAL HIGH (ref 65–99)
Potassium: 3.8 mmol/L (ref 3.5–5.1)
Sodium: 139 mmol/L (ref 135–145)
Total Bilirubin: 0.9 mg/dL (ref 0.3–1.2)
Total Protein: 7.1 g/dL (ref 6.5–8.1)

## 2017-09-15 LAB — CBC WITH DIFFERENTIAL/PLATELET
Basophils Absolute: 0 10*3/uL (ref 0.0–0.1)
Basophils Relative: 0 %
Eosinophils Absolute: 0.2 10*3/uL (ref 0.0–0.7)
Eosinophils Relative: 2 %
HCT: 49.6 % — ABNORMAL HIGH (ref 36.0–46.0)
Hemoglobin: 16.5 g/dL — ABNORMAL HIGH (ref 12.0–15.0)
Lymphocytes Relative: 22 %
Lymphs Abs: 1.6 10*3/uL (ref 0.7–4.0)
MCH: 30.8 pg (ref 26.0–34.0)
MCHC: 33.3 g/dL (ref 30.0–36.0)
MCV: 92.5 fL (ref 78.0–100.0)
Monocytes Absolute: 0.3 10*3/uL (ref 0.1–1.0)
Monocytes Relative: 5 %
Neutro Abs: 4.9 10*3/uL (ref 1.7–7.7)
Neutrophils Relative %: 71 %
Platelets: 188 10*3/uL (ref 150–400)
RBC: 5.36 MIL/uL — ABNORMAL HIGH (ref 3.87–5.11)
RDW: 12.3 % (ref 11.5–15.5)
WBC: 7 10*3/uL (ref 4.0–10.5)

## 2017-09-15 NOTE — Patient Instructions (Addendum)
[place] at [facility] Discharge Instructions  RECOMMENDATIONS MADE BY THE CONSULTANT AND ANY TEST RESULTS WILL BE SENT TO YOUR REFERRING PHYSICIAN.  You saw Mike Craze, NP, today DEXA scan (can we do it at same time as upcoming mammogram that's already scheduled? Otherwise, scan sometime within next 6 months before her next visit) -Refer to dermatology for likely benign skin lesions -Refer back to Dr. Oneida Alar for worsening indigestion/GERD symptoms -RTC for FU in 6 months with labs.   See Amy at checkout for appointments.   Thank you for choosing [place] at [facility] to provide your oncology and hematology care.  To afford each patient quality time with our provider, please arrive at least 15 minutes before your scheduled appointment time.    If you have a lab appointment with the [facility] please come in thru the  Main Entrance and check in at the main information desk  You need to re-schedule your appointment should you arrive 10 or more minutes late.  We strive to give you quality time with our providers, and arriving late affects you and other patients whose appointments are after yours.  Also, if you no show three or more times for appointments you may be dismissed from the clinic at the providers discretion.     Again, thank you for choosing [facility].  Our hope is that these requests will decrease the amount of time that you wait before being seen by our physicians.       _____________________________________________________________  Should you have questions after your visit to [facility], please contact our office at [phone] between the hours of 8:30 a.m. and 4:30 p.m.  Voicemails left after 4:30 p.m. will not be returned until the following business day.  For prescription refill requests, have your pharmacy contact our office.       Resources For Cancer Patients and their  Caregivers ? American Cancer Society: Can assist with transportation, wigs, general needs, runs Look Good Feel Better.        705-324-9274 ? Cancer Care: Provides financial assistance, online support groups, medication/co-pay assistance.  1-800-813-HOPE 415-135-4940) ? [place] Assists [place] cancer patients and their families through emotional , educational and financial support.  862-124-0118 ? [place] DSS Where to apply for food stamps, Medicaid and utility assistance. 8454030291 ? [facility]: Transportation to medical appointments. 937-477-1942 ? Social Security Administration: May apply for disability if have a Stage IV cancer. 878-754-9211 725-474-5944 ? [employer], Disability and Transit Services: Assists with nutrition, care and transit needs. [place] Support Programs: @10RELATIVEDAYS @ > Cancer Support Group  2nd Tuesday of the month 1pm-2pm, Journey Room  > Creative Journey  3rd Tuesday of the month 1130am-1pm, Journey Room  > Look Good Feel Better  1st Wednesday of the month 10am-12 noon, Journey Room (Call [place] to register [phone])

## 2017-09-15 NOTE — Progress Notes (Signed)
Patient states she has not missed any doses of Tamoxifen. She takes it daily.

## 2017-09-15 NOTE — Progress Notes (Unsigned)
Referral sent to Dermatology.  Appt 2/28 with Dr Rozann Lesches in [place].  Patient aware of appt

## 2017-09-17 ENCOUNTER — Encounter (HOSPITAL_COMMUNITY): Payer: Self-pay | Admitting: Adult Health

## 2017-09-17 NOTE — Progress Notes (Signed)
[facility],  [id number]   CLINIC:  Medical Oncology/Hematology  PCP:  Sinda Du, MD [place] [address] [phone]   REASON FOR VISIT:  Follow-up for Stage IIB invasive ductal carcinoma; ER+/PR+/HER2-  CURRENT THERAPY: Tamoxifen daily (started with Aromasin 05/2015, then switched to Arimidex 07/2015, then switched to Tamoxifen in 11/2015).    BRIEF ONCOLOGIC HISTORY:    Carcinoma of upper-outer quadrant of left female breast ([place])   06/24/2014 Mammogram    Left breast: 6 x 3 x 2.5 cm area of ill-defined increased density in the UOQ,  corresponding to the mass felt by the patient, not in the same location of the previously biopsied benign calcifications.       06/24/2014 Breast US    Left breast: ill-defined predominantly hypoechoic area with some increased echogenicity in the 2 o'clock position of the left breast, 7 cm from the nipple. 2.6 x 2.3 x 1.8 cm in maximum dimensions.      07/01/2014 Initial Biopsy    Left breast core needle bx: Invasive mammary carcinoma with lobular features, ER+ (100%), PR+ (91%), HER2/neu negative (ratio 1.09), Ki-67 32%. E-cadherin strongly positive, diagnostic for IDC       07/01/2014 Clinical Stage    Stage IIA: T2 N0      08/20/2014 Definitive Surgery    Left breast seed localized lumpectomy/SLNB: IDC, +LVI, DCIS, 1 LN removed and positive for metastatic carcinoma. Grade 2. HER2/neu repeated and negative (ratio 0.69-1.9).       08/20/2014 Pathologic Stage    Stage IIB: pT2 pN1a pMx      09/17/2014 Surgery    Port-a cath placement and re-excision      10/02/2014 - 01/15/2015 Chemotherapy    CMF x 6 cycles.  patient refused Taxane-containing chemotherapy.      03/07/2015 Procedure    Comp Cancer Panel reveals no variant at APC, ATM, AXIN2, BARD1, BMPR1A, BRCA1, BRCA2, BRIP1, CDH1, CDK4, CDKN2A, CHEK2, FANCC, MLH1, MSH2, MSH6, MUTYH, NBN, PALB2, PMS2, POLD1,  POLE, PTEN, RAD51C, RAD51D, SMAD4, STK11, TP53, VHL, and XRCC2.      04/06/2015 - 05/21/2015 Radiation Therapy    Adjuvant RT Pablo Ledger): Left breast/ 45 Gy over 25 fractions.  Left supraclavicular fossa and axilla/ 45 Gy over 25 fractions. Left breast boost/ 16 Gy over 8 fractions. Total dose: 61 Gy      06/04/2015 - 08/07/2015 Anti-estrogen oral therapy    Exemestane 25 mg daily.  Planned duration of therapy 5 years.       07/23/2015 Survivorship    Survivorship care plan completed and mailed to patient in lieu of in person visit      08/06/2015 Adverse Reaction    Severe hot flashes and mood swings      08/08/2015 - 12/07/2015 Anti-estrogen oral therapy    Arimidex      12/04/2015 Adverse Reaction    Worsening depression, patient stopped Arimidex      12/07/2015 -  Anti-estrogen oral therapy    Tamoxifen         INTERVAL HISTORY:  Ms.Telleria 67 y.o. female returns for routine follow-up for left breast cancer.   Overall, she tells me she has been struggling emotionally. She feels like her depression is worse. She feels tired all of the time; she has little interest in things. She tells me that may go 2-3 days and not wash her hair, which is not her normal behavior. She is tearful;  she feels very sad and "I cry all the time."  She does participate in a "coloring club", which she really enjoys. She is happy that this is still her one outlet of something she likes to do.    Remains on Tamoxifen. Denies any vaginal bleeding. She has arthralgias, particularly to her back, legs, and knees.  Her mammogram is scheduled for sometime in March. She denies any new/worsening breast complaints.   She is concerned about new skin lesions she's noticed to her (L) upper/inner thigh, outer (R) thigh, and 1 to upper (L) buttock.  Some of these lesions bleed at times.    Reports increased indigestion and GERD symptoms. Remains on Protonix daily. Denies any blood in her stools or dark/tarry stools.     She has chronic issues with leg swelling, urinary problems, weakness, and trouble sleeping.    Continues to smoke cigarettes, ~2 packs per day. She wants to quit, but doesn't know how. Her anxiety and depression are her biggest triggers to smoke.      REVIEW OF SYSTEMS:  Review of Systems - Oncology Per HPI. Otherwise, 14 point ROS completed and negative except as stated above.    PAST MEDICAL/SURGICAL HISTORY:  Past Medical History:  Diagnosis Date  . Asthma   . Asymptomatic varicose veins   . Breast cancer ([place]) 06/2014   left  . Chronic airway obstruction ([place])   . COPD (chronic obstructive pulmonary disease) ([place])   . Depression   . Hemorrhage rectum    and anus.  . Hypertension   . Insomnia   . Other symptoms involving digestive system(787.99)   . Pain    Hx.pain in joint involving pelvic region and thigh; pain in limb  . Palpitations   . Panic attacks   . Sinusitis   . Symptomatic menopausal or female climacteric states    Past Surgical History:  Procedure Laterality Date  . CESAREAN SECTION    . CHOLECYSTECTOMY  1985  . COLONOSCOPY  2002   Dr. Lindalou Hose: internal hemorrhoids, one small rectal polyp, adenomatous path   . COLONOSCOPY N/A 11/23/2015   Procedure: COLONOSCOPY;  Surgeon: Danie Binder, MD;  [facility];  Service: Endoscopy;  Laterality: N/A;  1100 - moved to 10:45 - office to notify  . ESOPHAGOGASTRODUODENOSCOPY N/A 11/23/2015   Procedure: ESOPHAGOGASTRODUODENOSCOPY (EGD);  Surgeon: Danie Binder, MD;  [facility]: [facility];  Service: Endoscopy;  Laterality: N/A;  . PORT-A-CATH REMOVAL  02/2015  . PORTACATH PLACEMENT Right 09/17/2014   Procedure: INSERTION PORT-A-CATH WITH ULTRASOUND;  Surgeon: Erroll Luna, MD;  Location: [facility];  Service: General;  Laterality: Right;  IJ  . RADIOACTIVE SEED GUIDED PARTIAL MASTECTOMY WITH AXILLARY SENTINEL LYMPH NODE BIOPSY Left 08/20/2014   Procedure: LEFT BREAST LUMPECTOMY WITH  RADIOACTIVE SEED LOCALIZATION AND SENTINEL LYMPH NODE MAPPING;  Surgeon: Erroll Luna, MD;  Location: [facility];  Service: General;  Laterality: Left;  . RE-EXCISION OF BREAST LUMPECTOMY Left 09/17/2014   Procedure: RE-EXCISION OF BREAST LUMPECTOMY;  Surgeon: Erroll Luna, MD;  Location: [facility];  Service: General;  Laterality: Left;  . TUBAL LIGATION       SOCIAL HISTORY:  Social History   Socioeconomic History  . Marital status: Divorced    Spouse name: Not on file  . Number of children: 2  . Years of education: Not on file  . Highest education level: Not on file  Social Needs  . Financial resource strain: Not on  file  . Food insecurity - worry: Not on file  . Food insecurity - inability: Not on file  . Transportation needs - medical: Not on file  . Transportation needs - non-medical: Not on file  Occupational History  . Not on file  Tobacco Use  . Smoking status: Former Smoker    Packs/day: 1.50    Types: Cigarettes    Last attempt to quit: 02/03/2016    Years since quitting: 1.6  . Smokeless tobacco: Never Used  Substance and Sexual Activity  . Alcohol use: No    Alcohol/week: 0.0 oz  . Drug use: No  . Sexual activity: No    Birth control/protection: None  Other Topics Concern  . Not on file  Social History Narrative  . Not on file    FAMILY HISTORY:  Family History  Problem Relation Age of Onset  . Diabetes Mother   . Ovarian cancer Mother 53  . Other Father        died in MVA at age 78  . Other Sister        mitral valve disorder  . Melanoma Sister 54       removed from back of leg  . Colon cancer Brother        early 69s, succumbed to disease  . Cancer Paternal Aunt        leukemia, bone, or lung cancer  . Alzheimer's disease Maternal Grandmother   . Heart attack Maternal Grandfather   . Heart attack Paternal Grandmother   . COPD Paternal Grandfather   . Emphysema Paternal Grandfather   . Congestive Heart  Failure Paternal Aunt   . Stomach cancer Paternal Uncle   . Kidney cancer Maternal Uncle   . Cancer Cousin        dx. teens/dx. 40s  . Cancer Cousin   . Colon cancer Cousin     CURRENT MEDICATIONS:  Outpatient Encounter Medications as of 09/15/2017  Medication Sig  . albuterol (PROVENTIL HFA;VENTOLIN HFA) 108 (90 BASE) MCG/ACT inhaler Inhale 2 puffs into the lungs every 6 (six) hours as needed for wheezing or shortness of breath.  . ALPRAZolam (XANAX) 0.5 MG tablet Take 0.5 mg by mouth 3 (three) times daily as needed for anxiety.  Marland Kitchen amoxicillin (AMOXIL) 500 MG capsule Take 500 mg by mouth 3 (three) times daily.  . calcium carbonate (TUMS - DOSED IN MG ELEMENTAL CALCIUM) 500 MG chewable tablet Chew 1 tablet by mouth 3 (three) times daily with meals. Reported on 12/07/2015  . hydrochlorothiazide (MICROZIDE) 12.5 MG capsule Take 12.5 mg by mouth daily. Reported on 12/07/2015  . ibuprofen (ADVIL,MOTRIN) 200 MG tablet Take 400 mg by mouth every 8 (eight) hours.  . pantoprazole (PROTONIX) 40 MG tablet TAKE ONE TABLET BY MOUTH ONCE DAILY  . tamoxifen (NOLVADEX) 20 MG tablet TAKE 1 TABLET BY MOUTH ONCE DAILY  . [DISCONTINUED] potassium chloride SA (K-DUR,KLOR-CON) 20 MEQ tablet Take 2 tablets (40 mEq total) by mouth daily.   No facility-administered encounter medications on file as of 09/15/2017.     ALLERGIES:  No Known Allergies   PHYSICAL EXAM:  ECOG Performance status: 1 - Symptomatic; remains independent   Vitals:   09/15/17 1131  BP: (!) 147/84  Pulse: (!) 108  Resp: 16  Temp: 98.6 F (37 C)  SpO2: 100%   Filed Weights   09/15/17 1131  Weight: 247 lb 8 oz (112.3 kg)    Physical Exam  Constitutional: She is oriented to person, place, and  time and well-developed, well-nourished, and in no distress.  HENT:  Head: Normocephalic.  Posterior oropharynx with erythema, likely from cigarette smoking  Eyes: Conjunctivae are normal. Pupils are equal, round, and reactive to light. No  scleral icterus.  Neck: Normal range of motion. Neck supple.  Cardiovascular: Normal rate and regular rhythm.  Pulmonary/Chest: Effort normal. No respiratory distress.    Diffuse mild rhonchi  Abdominal: Soft. Bowel sounds are normal. There is no tenderness.  Musculoskeletal: Normal range of motion. She exhibits no edema.  Lymphadenopathy:    She has no cervical adenopathy.       Right: No supraclavicular adenopathy present.       Left: No supraclavicular adenopathy present.  Neurological: She is alert and oriented to person, place, and time. No cranial nerve deficit. Gait normal.  Skin: Skin is warm and dry. No rash noted.  (L) upper thigh: appears to be small cutaneous horn (R) outer thigh: appears to be ~1 cm site of actinic keratosis (L) upper buttock: large ~2-3 cm dark ? carbuncle.   Psychiatric: Mood, memory, affect and judgment normal.  Depressed mood and affect; tearful at times during visit  Nursing note and vitals reviewed.    LABORATORY DATA:  I have reviewed the labs as listed.  CBC    Component Value Date/Time   WBC 7.0 09/15/2017 1055   RBC 5.36 (H) 09/15/2017 1055   HGB 16.5 (H) 09/15/2017 1055   HCT 49.6 (H) 09/15/2017 1055   PLT 188 09/15/2017 1055   MCV 92.5 09/15/2017 1055   MCH 30.8 09/15/2017 1055   MCHC 33.3 09/15/2017 1055   RDW 12.3 09/15/2017 1055   LYMPHSABS 1.6 09/15/2017 1055   MONOABS 0.3 09/15/2017 1055   EOSABS 0.2 09/15/2017 1055   BASOSABS 0.0 09/15/2017 1055   CMP Latest Ref Rng & Units 09/15/2017 03/15/2017 09/12/2016  Glucose 65 - 99 mg/dL 159(H) 139(H) 132(H)  BUN 6 - 20 mg/dL '16 11 13  ' Creatinine 0.44 - 1.00 mg/dL 0.90 0.86 1.02(H)  Sodium 135 - 145 mmol/L 139 136 138  Potassium 3.5 - 5.1 mmol/L 3.8 2.8(L) 4.8  Chloride 101 - 111 mmol/L 102 99(L) 99(L)  CO2 22 - 32 mmol/L '27 30 31  ' Calcium 8.9 - 10.3 mg/dL 9.6 9.7 9.7  Total Protein 6.5 - 8.1 g/dL 7.1 6.7 6.9  Total Bilirubin 0.3 - 1.2 mg/dL 0.9 1.2 1.1  Alkaline Phos 38 - 126  U/L 58 50 57  AST 15 - 41 U/L '23 20 17  ' ALT 14 - 54 U/L '23 18 17    ' PENDING LABS:    DIAGNOSTIC IMAGING:  *The following radiologic images and reports have been reviewed independently and agree with below findings.  Last mammogram: 10/07/16 CLINICAL DATA:  History of treated left breast cancer, status post lumpectomy, chemo and radiation therapy in 2016.  EXAM: 2D DIGITAL DIAGNOSTIC BILATERAL MAMMOGRAM WITH CAD AND ADJUNCT TOMO  COMPARISON:  Previous exam(s).  ACR Breast Density Category b: There are scattered areas of fibroglandular density.  FINDINGS: Mammographically, there are no suspicious masses, areas of nonsurgical architectural distortion or clustered microcalcifications in either breast. There are stable postsurgical and posttreatment changes in the left breast.  Mammographic images were processed with CAD.  IMPRESSION: No mammographic evidence of malignancy in either breast, status post left lumpectomy.  RECOMMENDATION: Diagnostic mammogram is suggested in 1 year. (Code:DM-B-01Y)  I have discussed the findings and recommendations with the patient. Results were also provided in writing at the conclusion of the visit.  If applicable, a reminder letter will be sent to the patient regarding the next appointment.  BI-RADS CATEGORY  2: Benign.   Electronically Signed   By: Fidela Salisbury M.D.   On: 10/07/2016 12:53     PATHOLOGY:  (L) lumpectomy: 08/20/14          ASSESSMENT & PLAN:   Stage IIB invasive ductal carcinoma; ER+/PR+/HER2-:  -Diagnosed in 06/2014. Treated with (L) lumpectomy with SLNB on 08/20/14, and required re-excision. Received adjuvant chemo with CMF x 6 cycles (pt refused Taxane-containing chemo). Genetic testing negative. Received adjuvant XRT and completed treatment on 05/21/15. Started anti-estrogen therapy with Aromasin in 05/2015. Aromasin switched to Arimidex in 07/2015 d/t severe hot flashes and mood swings.  Arimidex switched to Tamoxifen in 11/2015 due to worsening depression.  -Remains on Tamoxifen with decent tolerance. Plan to continue for 5-10 years.  -Clinical breast exam performed today and benign.  -Last mammogram 10/07/16 negative; will be due for annual mammo on 10/11/17 as previously scheduled.  -Return to cancer center in 6 months for follow-up with labs.   Bone health:  -Last DEXA scan 07/01/15 showed osteopenia with T-score -1.8. Her biennial DEXA scan is overdue. Will try to get next DEXA scan done with her upcoming mammogram; if this cannot be arranged, then will get DEXA scan done sometime before her next visit here; orders placed today.  -Continue calcium/vitamin D supplementation; increase weight bearing exercises as tolerated.  Tamoxifen can actually increase bone health over time, when compared to aromatase inhibitor therapy.    Depression:  -We spent quite a bit of time today discussing her mental state and how best to manage her depression and anxiety.  She meets criteria for major depression with depressed mood most of the day every day, anhedonia, fatigue, and decreased interest in personal appearance. "I have gone 2-3 days in the past and just didn't feel like washing my hair, and that is not me! I was always so concerned with my appearance."  She has been isolating herself a bit as well.   -Shares with me that her PCP has prescribed Zoloft in the past, but she never started taking it because she was worried about side effects "and the stuff I saw on TV about it causing heart problems."  Discussed with her the pathophysiology of depression and the neurotransmitters involved. Reviewed with her the purpose/side effects of SSRI therapy. Provided education that SSRI medications generally take 8+ weeks to see symptomatic improvement. Recommended she take 1/2 tab of her SSRI therapy to help mitigate any early side effects she may have.  Then begin taking 1 whole tab thereafter. After much  discussion, she agreed to give the Zoloft a try.  Encouraged her to follow-up with her PCP re: this medication and for refills in the future.    Tobacco use disorder:  -We discussed the pathophysiology of nicotine dependence and different strategies to stop smoking.  The gold standard of tobacco cessation is nicotine replacement (with nicotine patches & gum/lozenges) or Varenicline (Chantix).   Encouraged her to set some new "rules" that do not allow her to smoke in her house or during her coloring club sessions; instead she could use a piece of nicotine gum or a lozenge when she has a craving.  She will need continued support with regards to smoking cessation.  Ms.Yonts  understands that data suggests that "cold [place]" is the least effective way to stop using tobacco products.  Having a clear "quit plan" is much more  effective and requires a step-wise approach with continued support from a tobacco treatment specialist.  She would like to try Chantix to help her stop smoking. However, since she agreed to try SSRI therapy, I do not want to have her start 2 new medications at the same time so that side effects can be closely monitored.  Shared with her that my hope is that as her depression improves, she may be able to be better equipped to approach smoking cessation with additional resources.  I encouraged her to reach out to me if she has further questions or interest in her continued cessation efforts.  Greater than 15 minutes was spent in smoking cessation counseling with this patient.      Elevated Hgb/Hct:  -Likely d/t chronic tobacco abuse. Encouraged cessation as stated above.    Worsening indigestion/GERD:  -Remains on Protonix daily, with little improvement in her symptoms. She has seen Dr. Oneida Alar in the past with GI.  -Refer back to GI for further evaluation.    Benign-appearing skin lesions:  -Appears to have possible cutaneous horn on her leg, as well as possible actinic keratoses.     -Referral to dermatology for evaluation and treatment.         Dispo:  -DEXA scan overdue; will try to coordinate with upcoming mammogram - if this is not feasible, then will get DEXA scan done before next follow-up visit; orders placed today.  -Refer to GI for worsening indigestion/GERD.  -Refer to dermatology for likely benign skin lesions.  -Return to cancer center in 6 months for follow-up with labs.    All questions were answered to patient's stated satisfaction. Encouraged patient to call with any new concerns or questions before her next visit to the cancer center and we can certain see her sooner, if needed.    A total of 50 minutes was spent in face-to-face care of this patient, with greater than 50% of that time spent in counseling and care-coordination.    Orders placed this encounter:  Orders Placed This Encounter  Procedures  . [place], NP [place] [phone]

## 2017-09-18 ENCOUNTER — Encounter: Payer: Self-pay | Admitting: Gastroenterology

## 2017-10-11 ENCOUNTER — Ambulatory Visit
Admission: RE | Admit: 2017-10-11 | Discharge: 2017-10-11 | Disposition: A | Discharge status: Home / Self Care | Payer: Medicare HMO | Source: Ambulatory Visit | Attending: Adult Health | Admitting: Adult Health

## 2017-10-11 DIAGNOSIS — Z9889 Other specified postprocedural states: Secondary | ICD-10-CM

## 2017-10-11 HISTORY — DX: Personal history of irradiation: Z92.3

## 2017-10-11 HISTORY — DX: Personal history of antineoplastic chemotherapy: Z92.21

## 2017-10-11 IMAGING — MG DIGITAL DIAGNOSTIC BILATERAL MAMMOGRAM WITH TOMO AND CAD
8 of 13 series · 9 of 37 positions shown · non-contrast
Comparison: Previous exam(s).

CLINICAL DATA: Left lumpectomy.  Annual mammography.

EXAM:
DIGITAL DIAGNOSTIC BILATERAL MAMMOGRAM WITH CAD AND TOMO

[L CC]
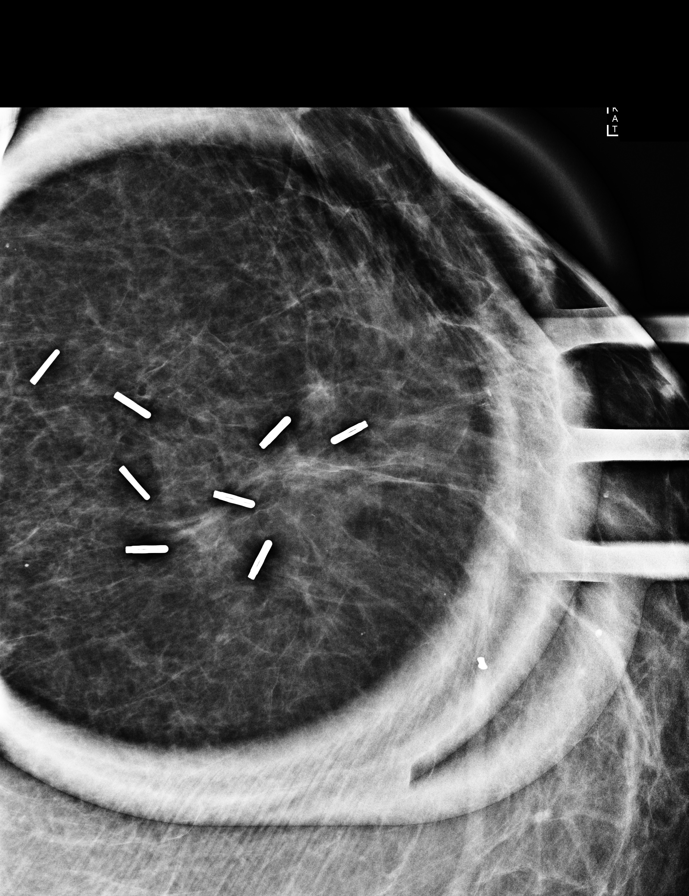

[L CC synth-2D]
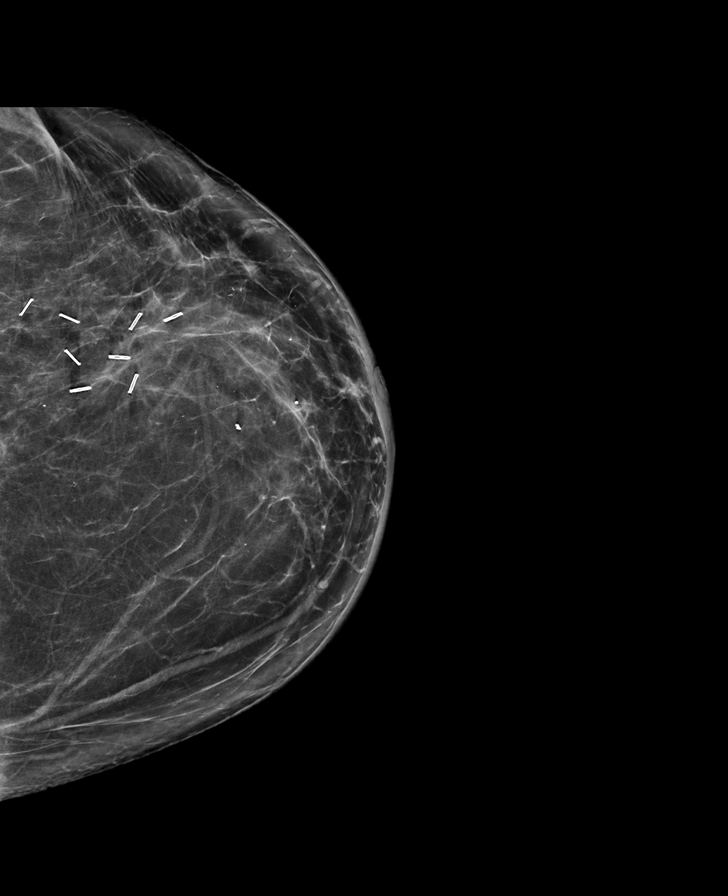

[R CC synth-2D]
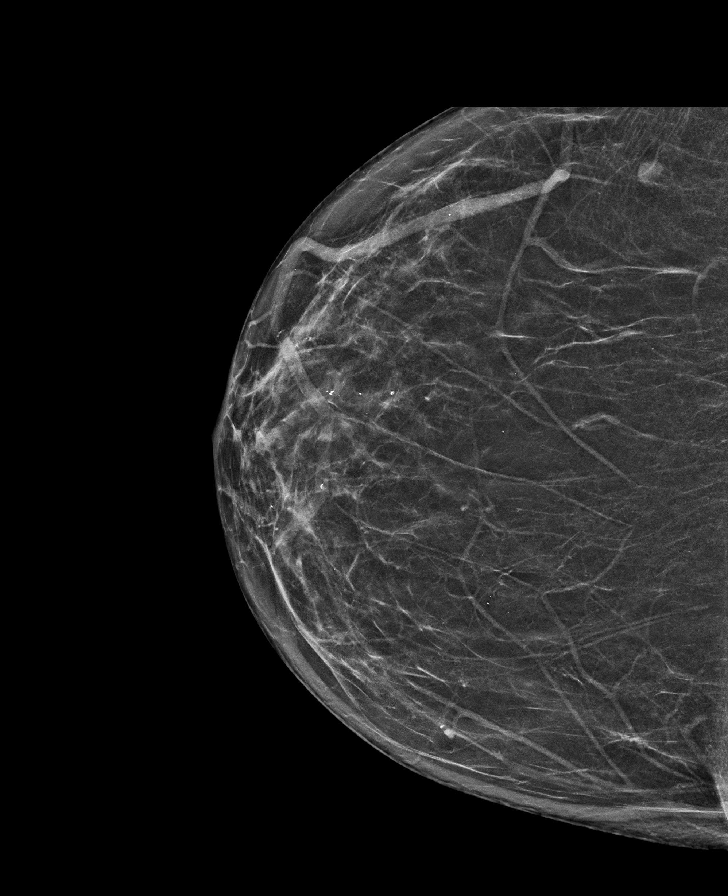

[L MLO synth-2D (1 of 3)]
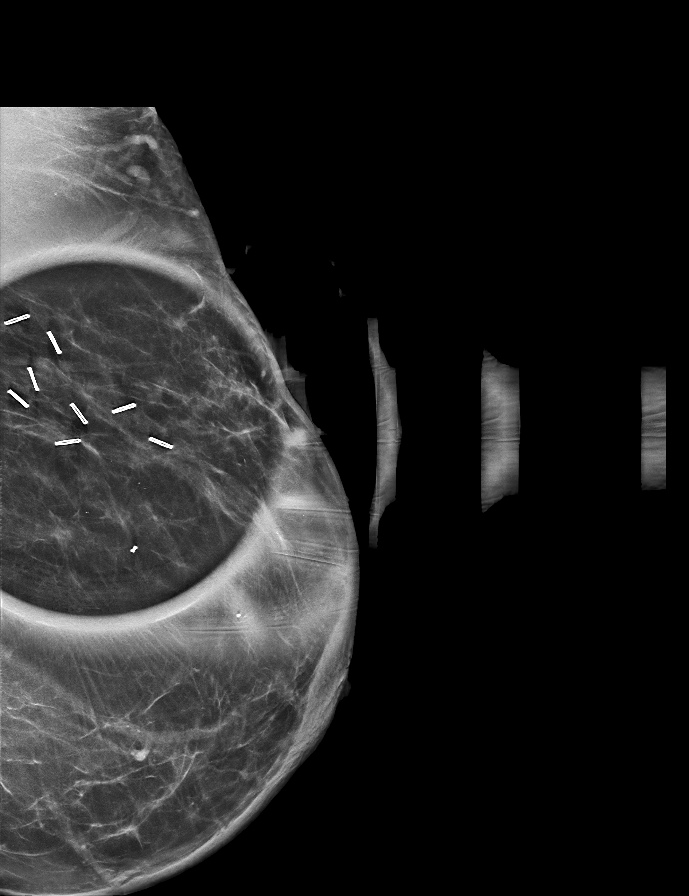

[L MLO synth-2D (2 of 3)]
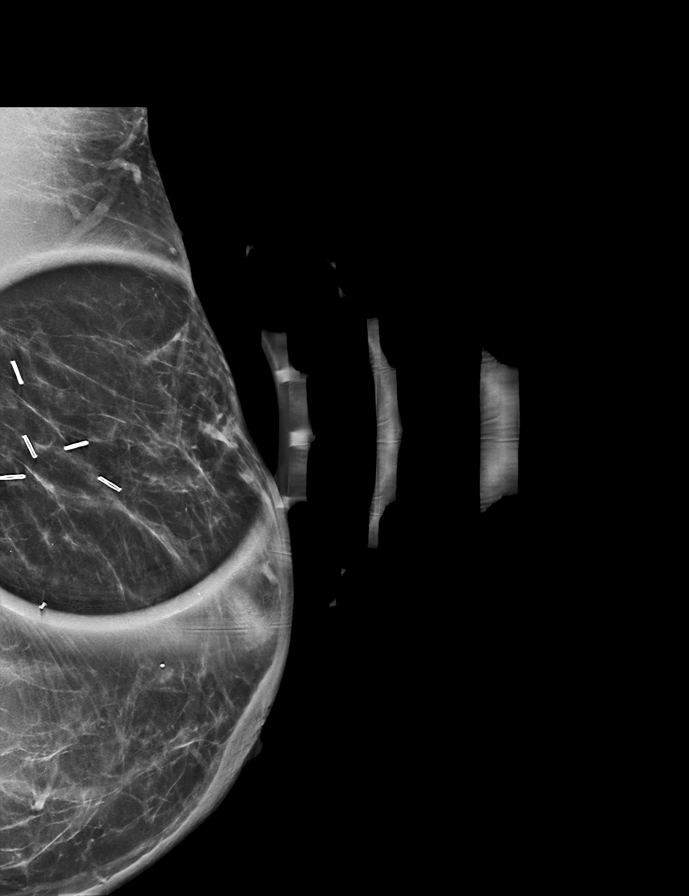

[R MLO synth-2D]
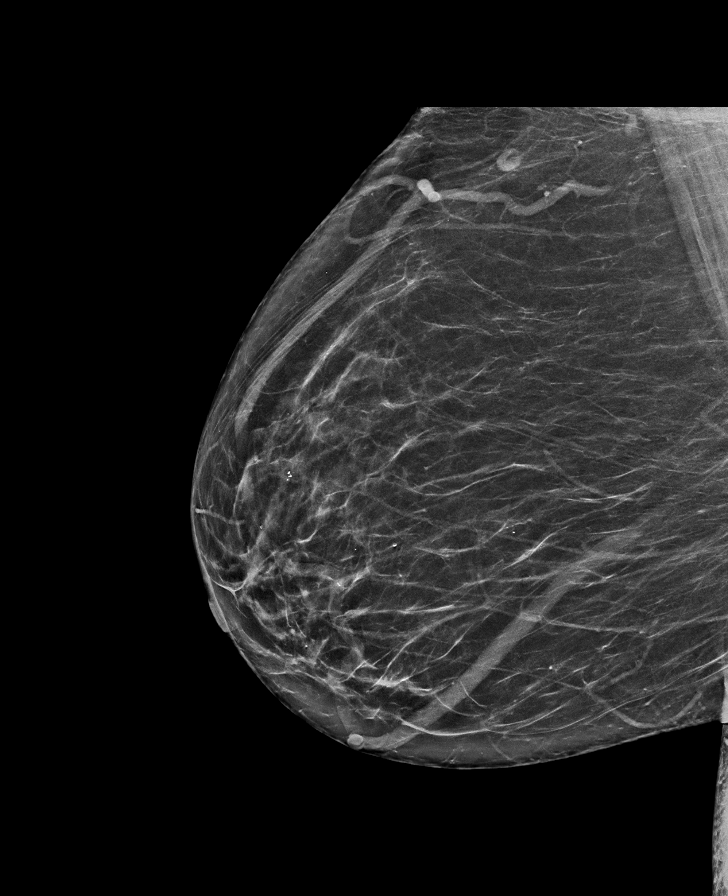

[L MLO synth-2D (3 of 3)]
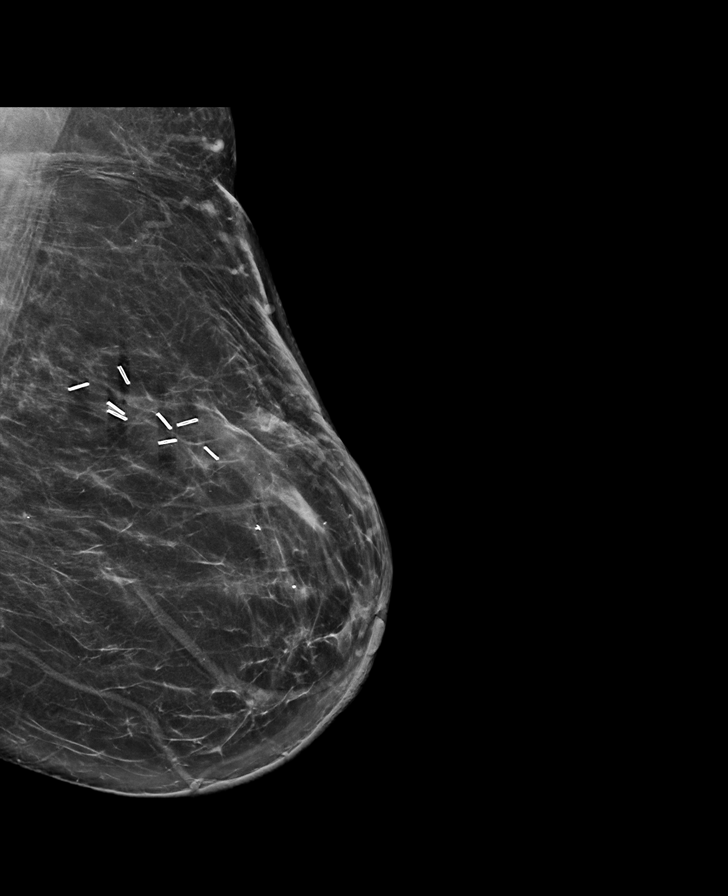

[L MLO tomo · 2 of 59 frames shown]
[frame 20/59]
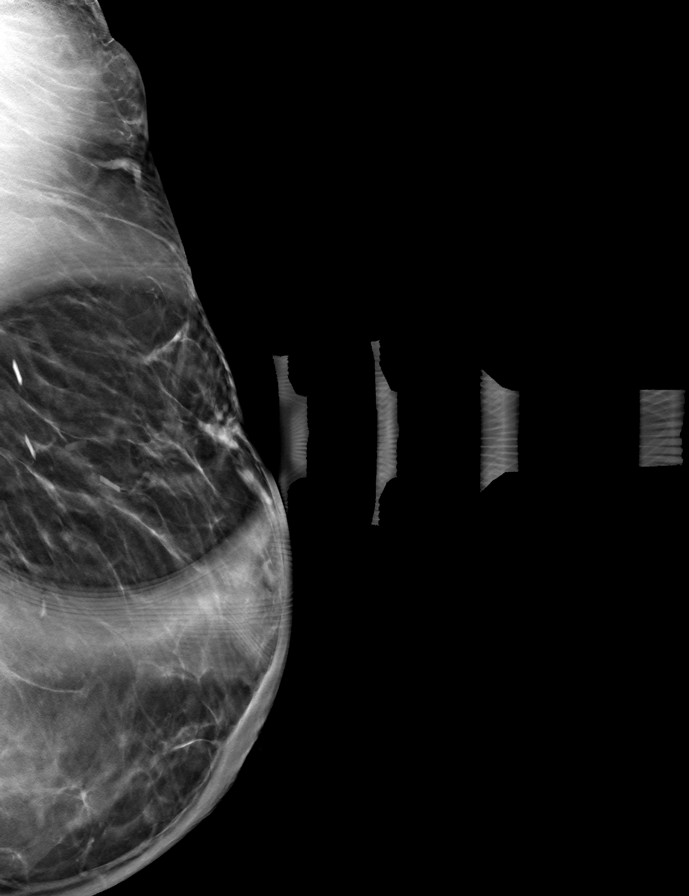
[frame 30/59]
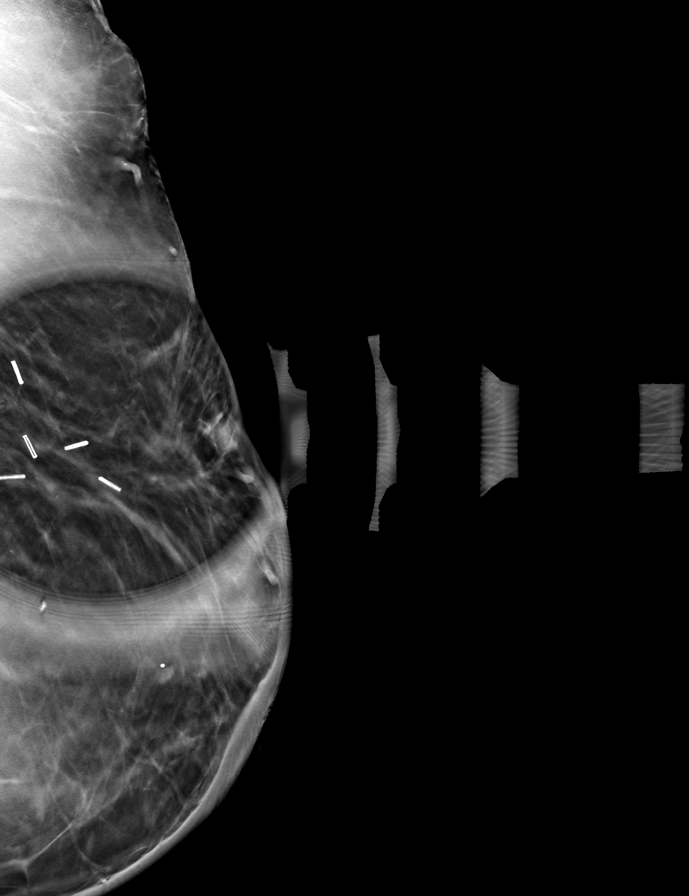

[9 of 37 positions shown; findings below may reference images not displayed]

ACR Breast Density Category b: There are scattered areas of
fibroglandular density.
FINDINGS: The left lumpectomy site is stable. No suspicious masses,
calcifications, or distortion.

Mammographic images were processed with CAD.
IMPRESSION: No mammographic evidence of malignancy.

RECOMMENDATION:
Annual diagnostic mammography.

I have discussed the findings and recommendations with the patient.
Results were also provided in writing at the conclusion of the
visit. If applicable, a reminder letter will be sent to the patient
regarding the next appointment.

BI-RADS CATEGORY  2: Benign.

## 2017-11-09 ENCOUNTER — Ambulatory Visit: Payer: Medicare HMO | Admitting: Gastroenterology

## 2017-11-09 ENCOUNTER — Other Ambulatory Visit (HOSPITAL_COMMUNITY): Payer: Medicare HMO

## 2017-12-18 ENCOUNTER — Other Ambulatory Visit (HOSPITAL_COMMUNITY): Payer: Self-pay | Admitting: Adult Health

## 2017-12-18 DIAGNOSIS — C50412 Malignant neoplasm of upper-outer quadrant of left female breast: Secondary | ICD-10-CM

## 2018-03-05 ENCOUNTER — Other Ambulatory Visit (HOSPITAL_COMMUNITY): Payer: Self-pay

## 2018-03-05 DIAGNOSIS — C50412 Malignant neoplasm of upper-outer quadrant of left female breast: Secondary | ICD-10-CM

## 2018-03-05 DIAGNOSIS — M8580 Other specified disorders of bone density and structure, unspecified site: Secondary | ICD-10-CM

## 2018-03-05 DIAGNOSIS — Z170 Estrogen receptor positive status [ER+]: Secondary | ICD-10-CM

## 2018-03-08 ENCOUNTER — Inpatient Hospital Stay (HOSPITAL_COMMUNITY): Payer: Medicare HMO | Attending: Hematology

## 2018-03-08 DIAGNOSIS — Z170 Estrogen receptor positive status [ER+]: Secondary | ICD-10-CM | POA: Diagnosis not present

## 2018-03-08 DIAGNOSIS — M8580 Other specified disorders of bone density and structure, unspecified site: Secondary | ICD-10-CM

## 2018-03-08 DIAGNOSIS — Z79810 Long term (current) use of selective estrogen receptor modulators (SERMs): Secondary | ICD-10-CM | POA: Insufficient documentation

## 2018-03-08 DIAGNOSIS — C50412 Malignant neoplasm of upper-outer quadrant of left female breast: Secondary | ICD-10-CM | POA: Insufficient documentation

## 2018-03-08 LAB — COMPREHENSIVE METABOLIC PANEL
ALT: 29 U/L (ref 0–44)
AST: 22 U/L (ref 15–41)
Albumin: 3.6 g/dL (ref 3.5–5.0)
Alkaline Phosphatase: 59 U/L (ref 38–126)
Anion gap: 5 (ref 5–15)
BUN: 15 mg/dL (ref 8–23)
CO2: 30 mmol/L (ref 22–32)
Calcium: 8.8 mg/dL — ABNORMAL LOW (ref 8.9–10.3)
Chloride: 105 mmol/L (ref 98–111)
Creatinine, Ser: 0.86 mg/dL (ref 0.44–1.00)
GFR calc Af Amer: >60 mL/min (ref >60)
GFR calc non Af Amer: >60 mL/min (ref >60)
Glucose, Bld: 136 mg/dL — ABNORMAL HIGH (ref 70–99)
Potassium: 4.4 mmol/L (ref 3.5–5.1)
Sodium: 140 mmol/L (ref 135–145)
Total Bilirubin: 1.1 mg/dL (ref 0.3–1.2)
Total Protein: 6.6 g/dL (ref 6.5–8.1)

## 2018-03-08 LAB — CBC WITH DIFFERENTIAL/PLATELET
Basophils Absolute: 0 10*3/uL (ref 0.0–0.1)
Basophils Relative: 1 %
Eosinophils Absolute: 0.3 10*3/uL (ref 0.0–0.7)
Eosinophils Relative: 4 %
HCT: 49.1 % — ABNORMAL HIGH (ref 36.0–46.0)
Hemoglobin: 16.9 g/dL — ABNORMAL HIGH (ref 12.0–15.0)
Lymphocytes Relative: 19 %
Lymphs Abs: 1.5 10*3/uL (ref 0.7–4.0)
MCH: 32.3 pg (ref 26.0–34.0)
MCHC: 34.4 g/dL (ref 30.0–36.0)
MCV: 93.7 fL (ref 78.0–100.0)
Monocytes Absolute: 0.4 10*3/uL (ref 0.1–1.0)
Monocytes Relative: 5 %
Neutro Abs: 5.4 10*3/uL (ref 1.7–7.7)
Neutrophils Relative %: 71 %
Platelets: 184 10*3/uL (ref 150–400)
RBC: 5.24 MIL/uL — ABNORMAL HIGH (ref 3.87–5.11)
RDW: 12.7 % (ref 11.5–15.5)
WBC: 7.6 10*3/uL (ref 4.0–10.5)

## 2018-03-15 ENCOUNTER — Inpatient Hospital Stay (HOSPITAL_COMMUNITY): Payer: Medicare HMO | Attending: Hematology | Admitting: Internal Medicine

## 2018-03-15 ENCOUNTER — Encounter (HOSPITAL_COMMUNITY): Payer: Self-pay | Admitting: Internal Medicine

## 2018-03-15 VITALS — BP 153/78 | HR 84 | Temp 98.2°F | Resp 18 | Wt 247.9 lb

## 2018-03-15 DIAGNOSIS — M545 Low back pain, unspecified: Secondary | ICD-10-CM

## 2018-03-15 DIAGNOSIS — M25559 Pain in unspecified hip: Secondary | ICD-10-CM

## 2018-03-15 DIAGNOSIS — Z8249 Family history of ischemic heart disease and other diseases of the circulatory system: Secondary | ICD-10-CM | POA: Diagnosis not present

## 2018-03-15 DIAGNOSIS — F419 Anxiety disorder, unspecified: Secondary | ICD-10-CM | POA: Diagnosis not present

## 2018-03-15 DIAGNOSIS — M8580 Other specified disorders of bone density and structure, unspecified site: Secondary | ICD-10-CM | POA: Diagnosis not present

## 2018-03-15 DIAGNOSIS — Z79899 Other long term (current) drug therapy: Secondary | ICD-10-CM | POA: Diagnosis not present

## 2018-03-15 DIAGNOSIS — Z170 Estrogen receptor positive status [ER+]: Secondary | ICD-10-CM | POA: Diagnosis not present

## 2018-03-15 DIAGNOSIS — Z800 Family history of malignant neoplasm of digestive organs: Secondary | ICD-10-CM | POA: Diagnosis not present

## 2018-03-15 DIAGNOSIS — C50412 Malignant neoplasm of upper-outer quadrant of left female breast: Secondary | ICD-10-CM

## 2018-03-15 DIAGNOSIS — G8929 Other chronic pain: Secondary | ICD-10-CM

## 2018-03-15 DIAGNOSIS — D751 Secondary polycythemia: Secondary | ICD-10-CM | POA: Diagnosis not present

## 2018-03-15 DIAGNOSIS — F329 Major depressive disorder, single episode, unspecified: Secondary | ICD-10-CM | POA: Diagnosis not present

## 2018-03-15 DIAGNOSIS — Z780 Asymptomatic menopausal state: Secondary | ICD-10-CM

## 2018-03-15 DIAGNOSIS — Z87891 Personal history of nicotine dependence: Secondary | ICD-10-CM

## 2018-03-15 DIAGNOSIS — M7989 Other specified soft tissue disorders: Secondary | ICD-10-CM

## 2018-03-15 DIAGNOSIS — Z79810 Long term (current) use of selective estrogen receptor modulators (SERMs): Secondary | ICD-10-CM | POA: Diagnosis not present

## 2018-03-15 NOTE — Progress Notes (Signed)
Diagnosis Carcinoma of upper-outer quadrant of left breast in female, estrogen receptor positive ([place]) - Plan: US Venous Img Lower Bilateral, NM Bone Scan Whole Body, MM DIAG BREAST TOMO BILATERAL, CBC with Differential/Platelet, Comprehensive metabolic panel, Lactate dehydrogenase  Leg swelling - Plan: US Venous Img Lower Bilateral, NM Bone Scan Whole Body, MM DIAG BREAST TOMO BILATERAL, CBC with Differential/Platelet, Comprehensive metabolic panel, Lactate dehydrogenase  Pain in joint involving pelvic region and thigh, unspecified laterality  Chronic bilateral low back pain without sciatica - Plan: US Venous Img Lower Bilateral, NM Bone Scan Whole Body, MM DIAG BREAST TOMO BILATERAL, CBC with Differential/Platelet, Comprehensive metabolic panel, Lactate dehydrogenase  Staging Cancer Staging Carcinoma of upper-outer quadrant of left female breast [facility]) Staging form: Breast, AJCC 7th Edition - Clinical stage from 07/01/2014: Stage IIA (T2, N0, M0) - Unsigned - Pathologic stage from 08/20/2014: Stage IIB (T2, N1a, cM0) - Unsigned   Assessment and Plan:  1.  Stage IIB invasive ductal carcinoma; ER+/PR+/HER2-.  Pt was diagnosed in 06/2014. Treated with (L) lumpectomy with SLNB on 08/20/14, and required re-excision. Received adjuvant chemo with CMF x 6 cycles (pt refused Taxane-containing chemo). Genetic testing negative. Received adjuvant XRT and completed treatment on 05/21/15. Started anti-estrogen therapy with Aromasin in 05/2015. Aromasin switched to Arimidex in 07/2015 d/t severe hot flashes and mood swings. Arimidex switched to Tamoxifen in 11/2015 due to worsening depression.  Plan to continue for 5-10 years.   Pt had bilateral diagnostic mammogram done 10/11/2017 that was negative.  She is set up for bilateral diagnostic mammogram in 09/2018 and will RTC at that time to go over results.    2.  LE swelling.  Pt has extensive varicosities noted.  Due to risk of thrombosis with Tamoxifen, she is  set up for bilateral doppler to evaluate for DVT. If negative, she will be referred to cardiology and vascular for evaluation.    3.  Smoking.  Cessation is recommended.  She is followed by Dr. Luan Pulling and reports she was set up for screening evaluation but cancelled imaging.  I have encouraged her to follow-up with Dr. Luan Pulling and strongly consider CT imaging due to extensive history of tobacco use.    4.  Polycythemia.  Labs done 03/08/2018 reviewed and showed WBC 7.6 HB 16.9 plts 184,000.  Chemistries WNL with K+ 4.4 Cr 0.86 and normal LFTs.  I have discussed with her elevated HCT likely secondary to smoking.  She had Jak 2 testing done in 2016 that was negative.    5.  Osteopenia.  Pt had Dexa done 07/01/2015.  She is on Tamoxifen. Pt should have repeat BMD in ASAP as she is overdue.  Will schedule.    6.  Depression.  Follow-up with PCP or psychology as directed.    Greater than 30 minutes spent with more than 50% spent in counseling and coordination of care.    Current Status:  Pt is seen today for follow-up.  She reports some leg swelling.    PATHOLOGY:  (L) lumpectomy: 08/20/14           Carcinoma of upper-outer quadrant of left female breast ([place])   06/24/2014 Mammogram    Left breast: 6 x 3 x 2.5 cm area of ill-defined increased density in the UOQ,  corresponding to the mass felt by the patient, not in the same location of the previously biopsied benign calcifications.     06/24/2014 Breast US    Left breast: ill-defined predominantly hypoechoic area with some increased echogenicity  in the 2 o'clock position of the left breast, 7 cm from the nipple. 2.6 x 2.3 x 1.8 cm in maximum dimensions.    07/01/2014 Initial Biopsy    Left breast core needle bx: Invasive mammary carcinoma with lobular features, ER+ (100%), PR+ (91%), HER2/neu negative (ratio 1.09), Ki-67 32%. E-cadherin strongly positive, diagnostic for IDC     07/01/2014 Clinical Stage    Stage IIA: T2 N0    08/20/2014  Definitive Surgery    Left breast seed localized lumpectomy/SLNB: IDC, +LVI, DCIS, 1 LN removed and positive for metastatic carcinoma. Grade 2. HER2/neu repeated and negative (ratio 0.69-1.9).     08/20/2014 Pathologic Stage    Stage IIB: pT2 pN1a pMx    09/17/2014 Surgery    Port-a cath placement and re-excision    10/02/2014 - 01/15/2015 Chemotherapy    CMF x 6 cycles.  patient refused Taxane-containing chemotherapy.    03/07/2015 Procedure    Comp Cancer Panel reveals no variant at APC, ATM, AXIN2, BARD1, BMPR1A, BRCA1, BRCA2, BRIP1, CDH1, CDK4, CDKN2A, CHEK2, FANCC, MLH1, MSH2, MSH6, MUTYH, NBN, PALB2, PMS2, POLD1, POLE, PTEN, RAD51C, RAD51D, SMAD4, STK11, TP53, VHL, and XRCC2.    04/06/2015 - 05/21/2015 Radiation Therapy    Adjuvant RT Pablo Ledger): Left breast/ 45 Gy over 25 fractions.  Left supraclavicular fossa and axilla/ 45 Gy over 25 fractions. Left breast boost/ 16 Gy over 8 fractions. Total dose: 61 Gy    06/04/2015 - 08/07/2015 Anti-estrogen oral therapy    Exemestane 25 mg daily.  Planned duration of therapy 5 years.     07/23/2015 Survivorship    Survivorship care plan completed and mailed to patient in lieu of in person visit    08/06/2015 Adverse Reaction    Severe hot flashes and mood swings    08/08/2015 - 12/07/2015 Anti-estrogen oral therapy    Arimidex    12/04/2015 Adverse Reaction    Worsening depression, patient stopped Arimidex    12/07/2015 -  Anti-estrogen oral therapy    Tamoxifen      Problem List Patient Active Problem List   Diagnosis Date Noted  . Shortness of breath [R06.02] 09/12/2016  . Well woman exam with routine gynecological exam [I29.798] 04/20/2016  . Morbid obesity ([place]) [E66.01] 03/03/2016  . Colon adenomas [D12.6] 03/03/2016  . Rash [R21] 01/18/2016  . GERD (gastroesophageal reflux disease) [K21.9] 10/19/2015  . Osteopenia determined by x-ray [M85.80] 09/07/2015  . High risk medication use [Z79.899] 09/07/2015  . Genetic testing  [Z13.79] 03/20/2015  . Family history of ovarian cancer [Z80.41] 03/20/2015  . Family history of colon cancer [Z80.0] 03/20/2015  . Family history of cancer [Z80.9] 03/20/2015  . Carcinoma of upper-outer quadrant of left female breast ([place]) [C50.412] 09/03/2014  . Anxiety [F41.9] 10/29/2012  . Blood in stool [K92.1] 10/29/2012  . Pain [R52] 10/29/2012  . Chronic airway obstruction ([place]) [J44.9] 10/29/2012  . Diarrhea [R19.7] 10/29/2012  . Tobacco use disorder [F17.200] 10/29/2012  . Unspecified essential hypertension [I10] 10/29/2012    Past Medical History Past Medical History:  Diagnosis Date  . Asthma   . Asymptomatic varicose veins   . Breast cancer ([place]) 06/2014   left  . Chronic airway obstruction ([place])   . COPD (chronic obstructive pulmonary disease) ([place])   . Depression   . Hemorrhage rectum    and anus.  . Hypertension   . Insomnia   . Other symptoms involving digestive system(787.99)   . Pain    Hx.pain in joint involving pelvic region and  thigh; pain in limb  . Palpitations   . Panic attacks   . Personal history of chemotherapy 2016  . Personal history of radiation therapy 2016  . Sinusitis   . Symptomatic menopausal or female climacteric states     Past Surgical History Past Surgical History:  Procedure Laterality Date  . BREAST BIOPSY Left 10/2012  . BREAST BIOPSY Left 07/01/2014   malignant  . BREAST LUMPECTOMY Left 08/20/2014  . CESAREAN SECTION    . CHOLECYSTECTOMY  1985  . COLONOSCOPY  2002   Dr. Lindalou Hose: internal hemorrhoids, one small rectal polyp, adenomatous path   . COLONOSCOPY N/A 11/23/2015   Procedure: COLONOSCOPY;  Surgeon: Danie Binder, MD;  [facility];  Service: Endoscopy;  Laterality: N/A;  1100 - moved to 10:45 - office to notify  . ESOPHAGOGASTRODUODENOSCOPY N/A 11/23/2015   Procedure: ESOPHAGOGASTRODUODENOSCOPY (EGD);  Surgeon: Danie Binder, MD;  [facility];  Service: Endoscopy;  Laterality: N/A;  .  PORT-A-CATH REMOVAL  02/2015  . PORTACATH PLACEMENT Right 09/17/2014   Procedure: INSERTION PORT-A-CATH WITH ULTRASOUND;  Surgeon: Erroll Luna, MD;  Location: [place];  Service: General;  Laterality: Right;  IJ  . RADIOACTIVE SEED GUIDED PARTIAL MASTECTOMY WITH AXILLARY SENTINEL LYMPH NODE BIOPSY Left 08/20/2014   Procedure: LEFT BREAST LUMPECTOMY WITH RADIOACTIVE SEED LOCALIZATION AND SENTINEL LYMPH NODE MAPPING;  Surgeon: Erroll Luna, MD;  Location: [place];  Service: General;  Laterality: Left;  . RE-EXCISION OF BREAST LUMPECTOMY Left 09/17/2014   Procedure: RE-EXCISION OF BREAST LUMPECTOMY;  Surgeon: Erroll Luna, MD;  Location: [place];  Service: General;  Laterality: Left;  . TUBAL LIGATION      Family History Family History  Problem Relation Age of Onset  . Diabetes Mother   . Ovarian cancer Mother 64  . Other Father        died in MVA at age 16  . Other Sister        mitral valve disorder  . Melanoma Sister 54       removed from back of leg  . Colon cancer Brother        early 93s, succumbed to disease  . Cancer Paternal Aunt        leukemia, bone, or lung cancer  . Alzheimer's disease Maternal Grandmother   . Heart attack Maternal Grandfather   . Heart attack Paternal Grandmother   . COPD Paternal Grandfather   . Emphysema Paternal Grandfather   . Congestive Heart Failure Paternal Aunt   . Stomach cancer Paternal Uncle   . Kidney cancer Maternal Uncle   . Cancer Cousin        dx. teens/dx. 40s  . Cancer Cousin   . Colon cancer Cousin      Social History  reports that she quit smoking about 2 years ago. Her smoking use included cigarettes. She smoked 1.50 packs per day. She has never used smokeless tobacco. She reports that she does not drink alcohol or use drugs.  Medications  Current Outpatient Medications:  .  albuterol (PROVENTIL HFA;VENTOLIN HFA) 108 (90 BASE) MCG/ACT inhaler, Inhale 2 puffs into the  lungs every 6 (six) hours as needed for wheezing or shortness of breath., Disp: , Rfl:  .  ALPRAZolam (XANAX) 0.5 MG tablet, Take 0.5 mg by mouth 3 (three) times daily as needed for anxiety., Disp: , Rfl:  .  amoxicillin (AMOXIL) 500 MG capsule, Take 500 mg by mouth 3 (three) times  daily., Disp: , Rfl:  .  calcium carbonate (TUMS - DOSED IN MG ELEMENTAL CALCIUM) 500 MG chewable tablet, Chew 1 tablet by mouth 3 (three) times daily with meals. Reported on 12/07/2015, Disp: , Rfl:  .  hydrochlorothiazide (MICROZIDE) 12.5 MG capsule, Take 12.5 mg by mouth daily. Reported on 12/07/2015, Disp: , Rfl:  .  ibuprofen (ADVIL,MOTRIN) 200 MG tablet, Take 400 mg by mouth every 8 (eight) hours., Disp: , Rfl:  .  pantoprazole (PROTONIX) 40 MG tablet, TAKE ONE TABLET BY MOUTH ONCE DAILY, Disp: 90 tablet, Rfl: 3 .  tamoxifen (NOLVADEX) 20 MG tablet, TAKE 1 TABLET BY MOUTH ONCE DAILY, Disp: 30 tablet, Rfl: 3  Allergies Patient has no known allergies.  Review of Systems Review of Systems - Oncology ROS negative other than leg swelling   Physical Exam  Vitals Wt Readings from Last 3 Encounters:  03/15/18 247 lb 14.4 oz (112.4 kg)  09/15/17 247 lb 8 oz (112.3 kg)  03/15/17 256 lb (116.1 kg)   Temp Readings from Last 3 Encounters:  03/15/18 98.2 F (36.8 C) (Oral)  09/15/17 98.6 F (37 C) (Oral)  09/12/16 98 F (36.7 C) (Oral)   BP Readings from Last 3 Encounters:  03/15/18 (!) 153/78  09/15/17 (!) 147/84  03/15/17 (!) 145/75   Pulse Readings from Last 3 Encounters:  03/15/18 84  09/15/17 (!) 108  03/15/17 79    Constitutional: Well-developed, well-nourished, and in no distress.   HENT: Head: Normocephalic and atraumatic.  Mouth/Throat: No oropharyngeal exudate. Mucosa moist. Eyes: Pupils are equal, round, and reactive to light. Conjunctivae are normal. No scleral icterus.  Neck: Normal range of motion. Neck supple. No JVD present.  Cardiovascular: Normal rate, regular rhythm and normal  heart sounds.  Exam reveals no gallop and no friction rub.   No murmur heard. Pulmonary/Chest: Effort normal and breath sounds normal. No respiratory distress. No wheezes.No rales.  Abdominal: Soft. Bowel sounds are normal. No distension. There is no tenderness. There is no guarding.  Musculoskeletal: 1+ edema bilaterally.  Multiple varicosities noted.   Lymphadenopathy: No cervical, axillary or supraclavicular adenopathy.  Neurological: Alert and oriented to person, place, and time. No cranial nerve deficit.  Skin: Skin is warm and dry. No rash noted. No erythema. No pallor.  Psychiatric: Affect and judgment normal.  Bilateral breast exam:  Chaperone present.  Left lumpectomy.  No dominant palpable masses noted bilaterally.    Labs No visits with results within 3 Day(s) from this visit.  Latest known visit with results is:  Appointment on 03/08/2018  Component Date Value Ref Range Status  . WBC 03/08/2018 7.6  4.0 - 10.5 K/uL Final  . RBC 03/08/2018 5.24* 3.87 - 5.11 MIL/uL Final  . Hemoglobin 03/08/2018 16.9* 12.0 - 15.0 g/dL Final  . HCT 03/08/2018 49.1* 36.0 - 46.0 % Final  . MCV 03/08/2018 93.7  78.0 - 100.0 fL Final  . MCH 03/08/2018 32.3  26.0 - 34.0 pg Final  . MCHC 03/08/2018 34.4  30.0 - 36.0 g/dL Final  . RDW 03/08/2018 12.7  11.5 - 15.5 % Final  . Platelets 03/08/2018 184  150 - 400 K/uL Final  . Neutrophils Relative % 03/08/2018 71  % Final  . Neutro Abs 03/08/2018 5.4  1.7 - 7.7 K/uL Final  . Lymphocytes Relative 03/08/2018 19  % Final  . Lymphs Abs 03/08/2018 1.5  0.7 - 4.0 K/uL Final  . Monocytes Relative 03/08/2018 5  % Final  . Monocytes Absolute 03/08/2018 0.4  0.1 - 1.0 K/uL Final  . Eosinophils Relative 03/08/2018 4  % Final  . Eosinophils Absolute 03/08/2018 0.3  0.0 - 0.7 K/uL Final  . Basophils Relative 03/08/2018 1  % Final  . Basophils Absolute 03/08/2018 0.0  0.0 - 0.1 K/uL Final   Performed at [facility], [address] [place], [place] [address]  .  Sodium 03/08/2018 140  135 - 145 mmol/L Final  . Potassium 03/08/2018 4.4  3.5 - 5.1 mmol/L Final  . Chloride 03/08/2018 105  98 - 111 mmol/L Final  . CO2 03/08/2018 30  22 - 32 mmol/L Final  . Glucose, Bld 03/08/2018 136* 70 - 99 mg/dL Final  . BUN 03/08/2018 15  8 - 23 mg/dL Final  . Creatinine, Ser 03/08/2018 0.86  0.44 - 1.00 mg/dL Final  . Calcium 03/08/2018 8.8* 8.9 - 10.3 mg/dL Final  . Total Protein 03/08/2018 6.6  6.5 - 8.1 g/dL Final  . Albumin 03/08/2018 3.6  3.5 - 5.0 g/dL Final  . AST 03/08/2018 22  15 - 41 U/L Final  . ALT 03/08/2018 29  0 - 44 U/L Final  . Alkaline Phosphatase 03/08/2018 59  38 - 126 U/L Final  . Total Bilirubin 03/08/2018 1.1  0.3 - 1.2 mg/dL Final  . GFR calc non Af Amer 03/08/2018 >60  >60 mL/min Final  . GFR calc Af Amer 03/08/2018 >60  >60 mL/min Final   Comment: (NOTE) The eGFR has been calculated using the CKD EPI equation. This calculation has not been validated in all clinical situations. eGFR's persistently <60 mL/min signify possible Chronic Kidney Disease.   Georgiann Hahn gap 03/08/2018 5  5 - 15 Final   Performed at [facility], [address], [place], [place] [address]     Pathology Orders Placed This Encounter  Procedures  . US Venous Img Lower Bilateral    Standing Status:   Future    Standing Expiration Date:   03/15/2019    Order Specific Question:   Reason for Exam (SYMPTOM  OR DIAGNOSIS REQUIRED)    Answer:   leg swelling, pt on tamoxifen.  Evaluate for DVT    Order Specific Question:   Preferred imaging location?    Answer:   [place] [facility] Whole Body    Standing Status:   Future    Standing Expiration Date:   03/15/2019    Order Specific Question:   If indicated for the ordered procedure, I authorize the administration of a radiopharmaceutical per Radiology protocol    Answer:   Yes    Order Specific Question:   Preferred imaging location?    Answer:   [facility]    Order Specific Question:    Radiology Contrast Protocol - do NOT remove file path    Answer:   [email or web address]  . MM DIAG BREAST TOMO BILATERAL    Standing Status:   Future    Standing Expiration Date:   03/16/2019    Order Specific Question:   Reason for Exam (SYMPTOM  OR DIAGNOSIS REQUIRED)    Answer:   left breast cancer    Order Specific Question:   Preferred imaging location?    Answer:   [facility]  . CBC with Differential/Platelet    Standing Status:   Future    Standing Expiration Date:   03/15/2020  . Comprehensive metabolic panel    Standing Status:   Future    Standing Expiration Date:   03/15/2020  .  Lactate dehydrogenase    Standing Status:   Future    Standing Expiration Date:   03/15/2020       Zoila Shutter MD

## 2018-03-15 NOTE — Patient Instructions (Signed)
[facility] Discharge Instructions  You saw Dr. Higgs today.   Thank you for choosing [facility] at [facility] to provide your oncology and hematology care.  To afford each patient quality time with our provider, please arrive at least 15 minutes before your scheduled appointment time.   If you have a lab appointment with the Cancer Center please come in thru the  Main Entrance and check in at the main information desk  You need to re-schedule your appointment should you arrive 10 or more minutes late.  We strive to give you quality time with our providers, and arriving late affects you and other patients whose appointments are after yours.  Also, if you no show three or more times for appointments you may be dismissed from the clinic at the providers discretion.     Again, thank you for choosing [facility].  Our hope is that these requests will decrease the amount of time that you wait before being seen by our physicians.       _____________________________________________________________  Should you have questions after your visit to [facility], please contact our office at [phone] between the hours of 8:00 a.m. and 4:30 p.m.  Voicemails left after 4:00 p.m. will not be returned until the following business day.  For prescription refill requests, have your pharmacy contact our office and allow 72 hours.    Cancer Center Support Programs:   > Cancer Support Group  2nd Tuesday of the month 1pm-2pm, Journey Room    

## 2018-03-16 ENCOUNTER — Telehealth (HOSPITAL_COMMUNITY): Payer: Self-pay

## 2018-03-16 ENCOUNTER — Ambulatory Visit (HOSPITAL_COMMUNITY)
Admission: RE | Admit: 2018-03-16 | Discharge: 2018-03-16 | Disposition: A | Discharge status: Home / Self Care | Payer: Medicare HMO | Source: Ambulatory Visit | Attending: Internal Medicine | Admitting: Internal Medicine

## 2018-03-16 DIAGNOSIS — M7989 Other specified soft tissue disorders: Secondary | ICD-10-CM

## 2018-03-16 DIAGNOSIS — Z170 Estrogen receptor positive status [ER+]: Secondary | ICD-10-CM | POA: Diagnosis not present

## 2018-03-16 DIAGNOSIS — C50412 Malignant neoplasm of upper-outer quadrant of left female breast: Secondary | ICD-10-CM | POA: Insufficient documentation

## 2018-03-16 DIAGNOSIS — M545 Low back pain, unspecified: Secondary | ICD-10-CM

## 2018-03-16 DIAGNOSIS — G8929 Other chronic pain: Secondary | ICD-10-CM | POA: Insufficient documentation

## 2018-03-16 IMAGING — US US EXTREM LOW VENOUS BILAT
1 series · 13 of 24 positions shown · non-contrast
Comparison: None.

CLINICAL DATA: Bilateral lower extremity edema for several months



[Series 1: us extrem low venous bilat · 13 of 59 slices shown]
[im 1/59]
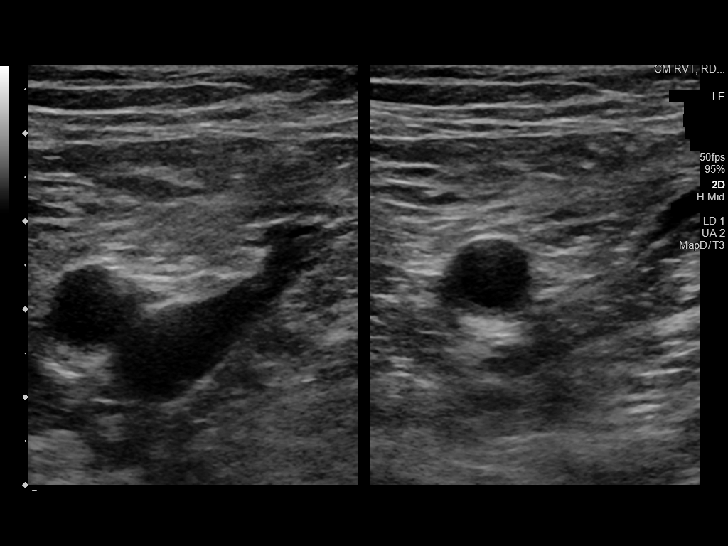
[im 6/59]
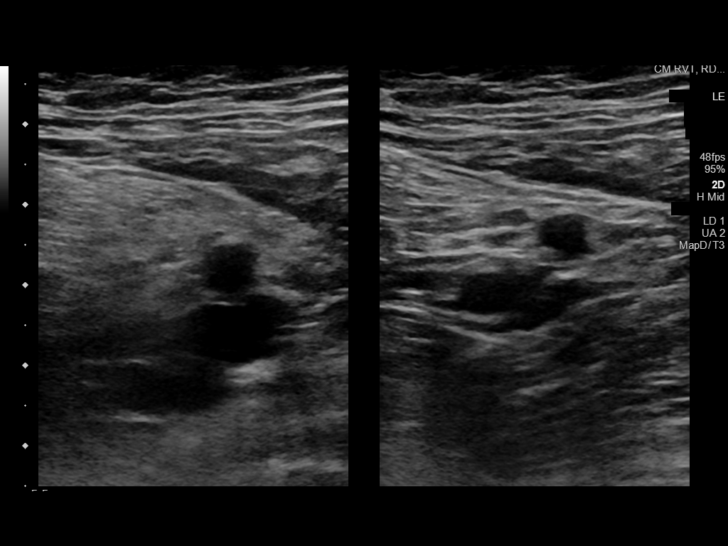
[im 11/59]
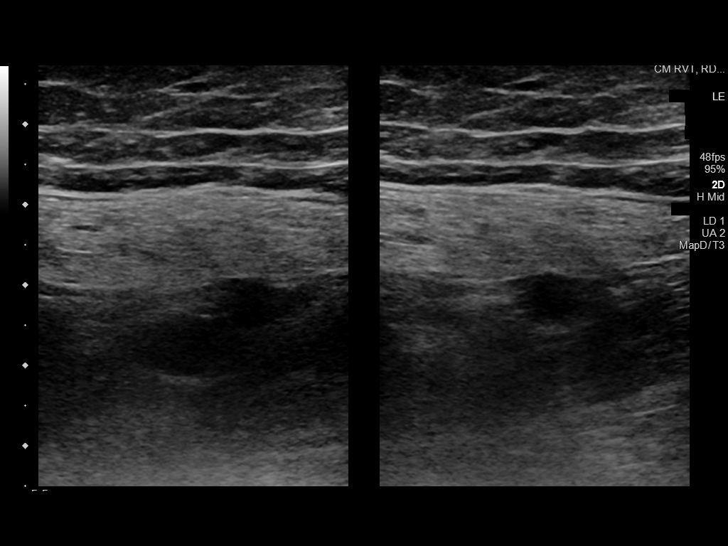
[im 16/59]
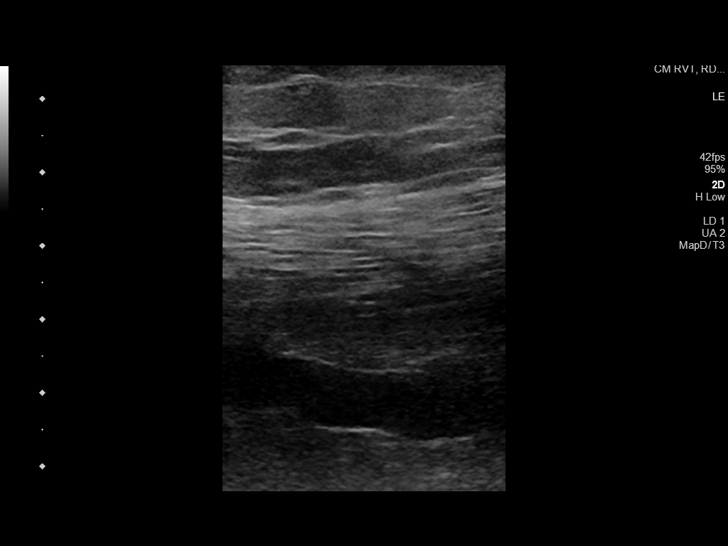
[im 21/59]
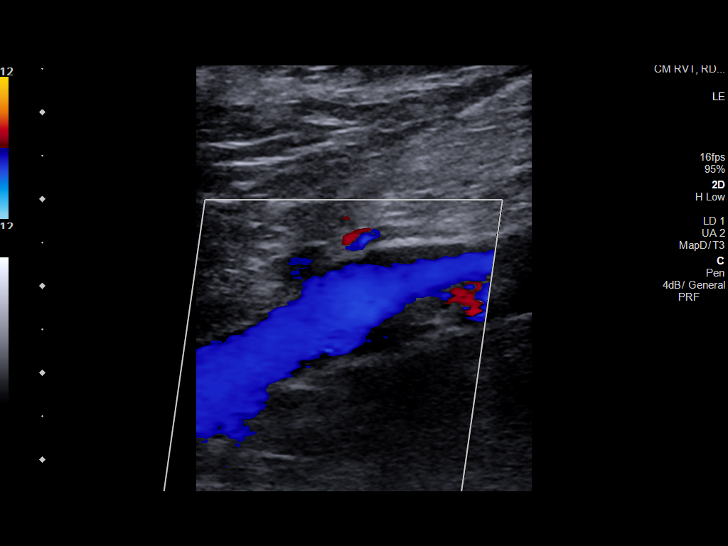
[im 26/59]
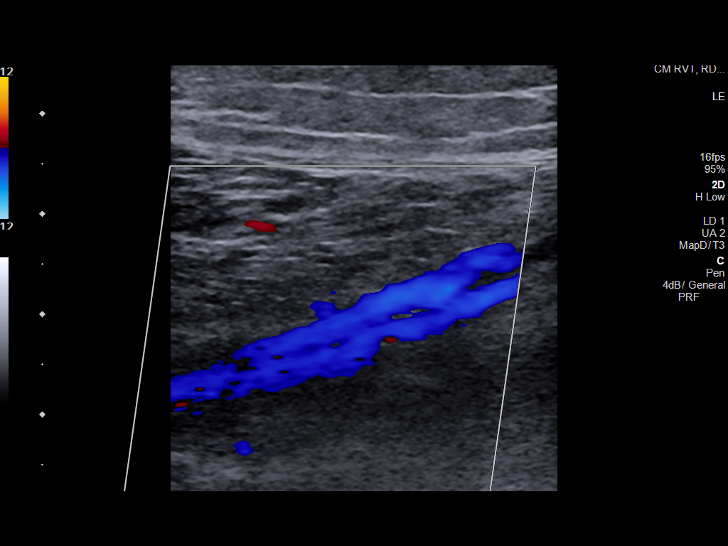
[im 31/59]
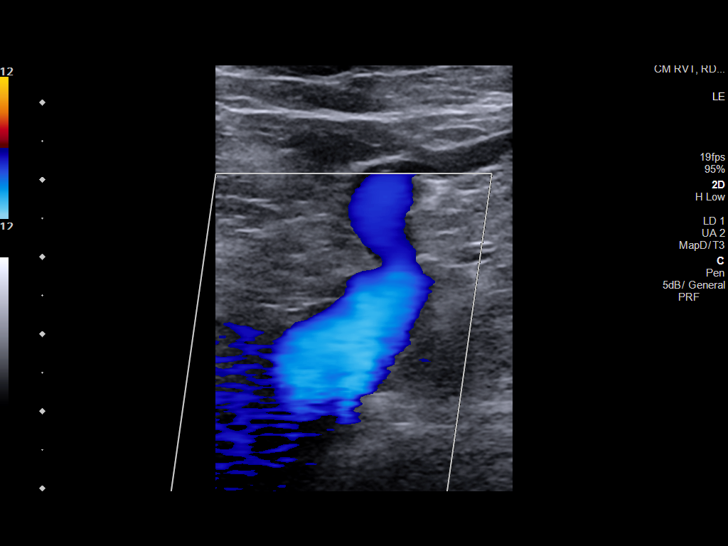
[im 33/59]
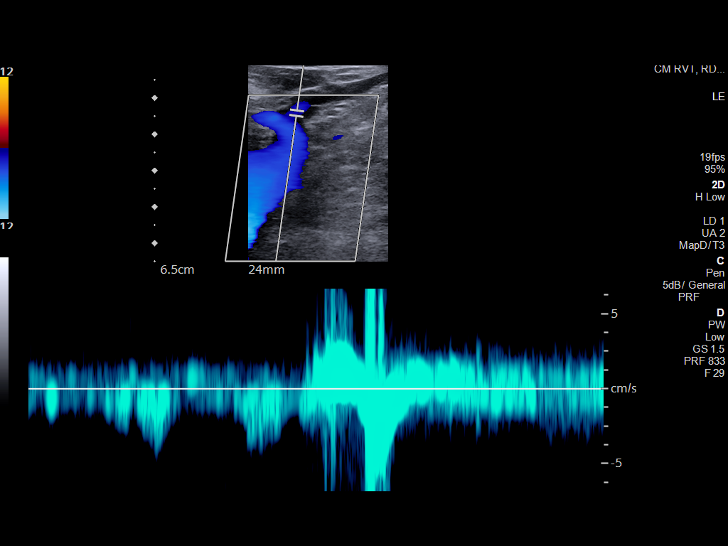
[im 38/59]
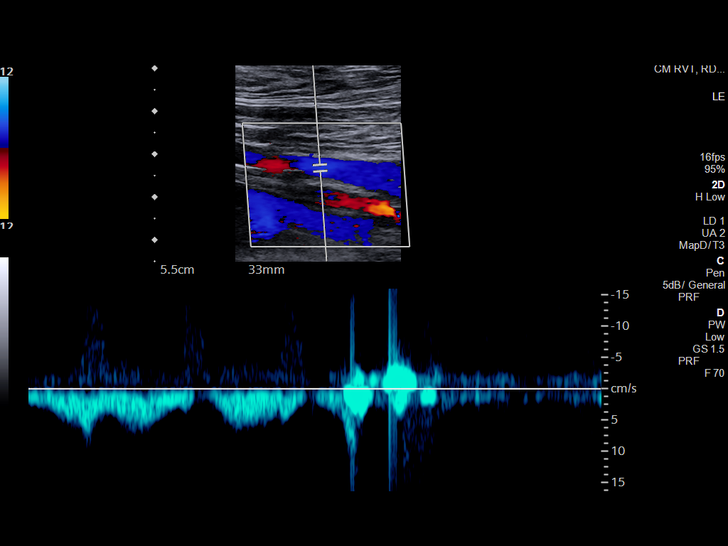
[im 43/59]
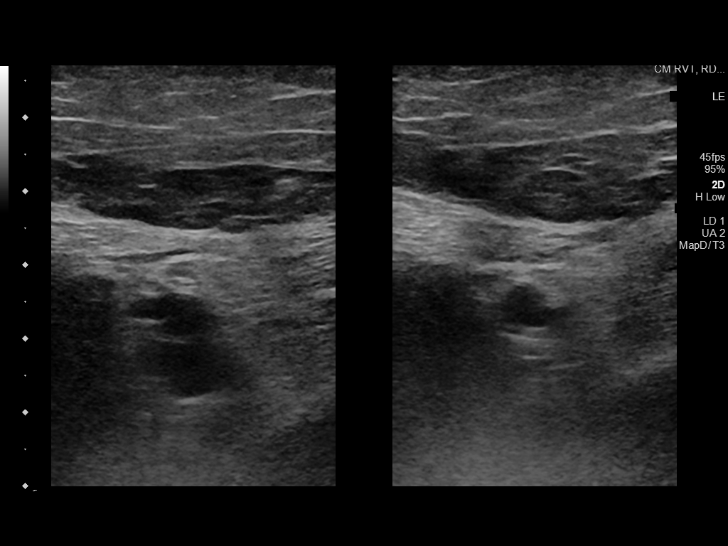
[im 48/59]
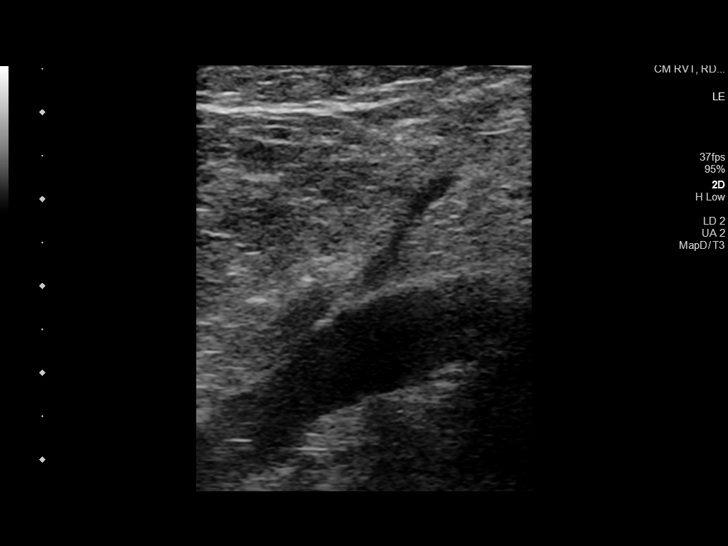
[im 53/59]
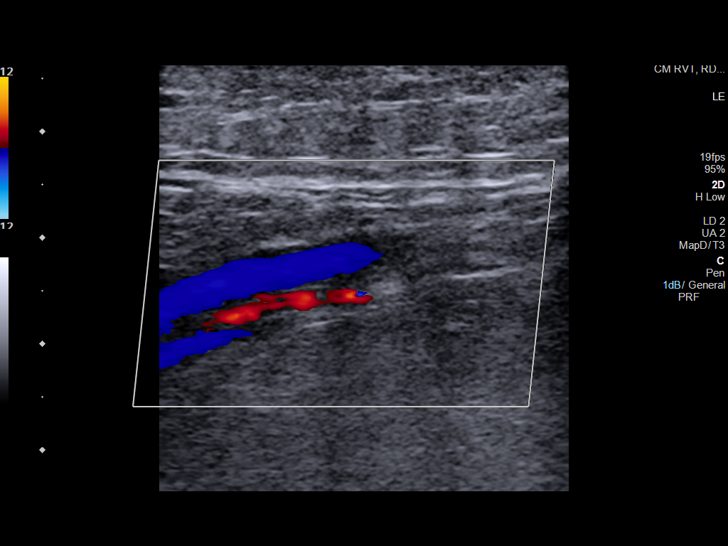
[im 59/59]
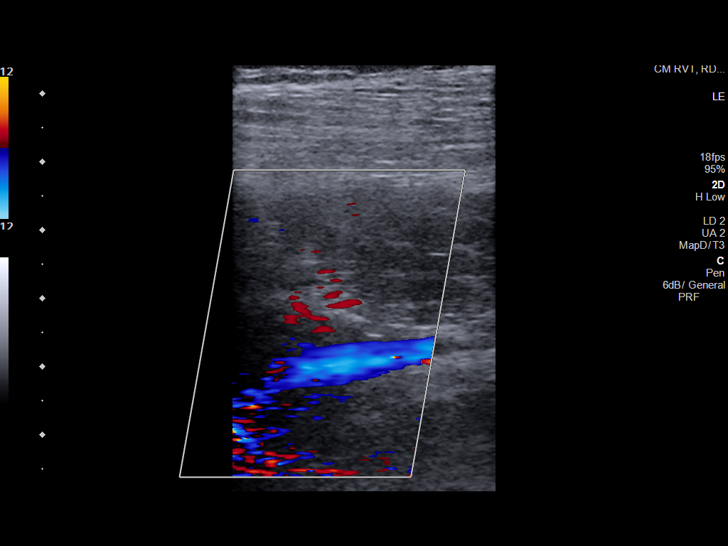

[13 of 24 positions shown; findings below may reference images not displayed]

FINDINGS: RIGHT LOWER EXTREMITY

Common Femoral Vein: No evidence of thrombus. Normal
compressibility, respiratory phasicity and response to augmentation.

Saphenofemoral Junction: No evidence of thrombus. Normal
compressibility and flow on color Doppler imaging.

Profunda Femoral Vein: No evidence of thrombus. Normal
compressibility and flow on color Doppler imaging.

Femoral Vein: No evidence of thrombus. Normal compressibility,
respiratory phasicity and response to augmentation.

Popliteal Vein: No evidence of thrombus. Normal compressibility,
respiratory phasicity and response to augmentation.

Calf Veins: No evidence of thrombus. Normal compressibility and flow
on color Doppler imaging.

Superficial Great Saphenous Vein: No evidence of thrombus. Normal
compressibility.

Venous Reflux:  None.

Other Findings:  None.

LEFT LOWER EXTREMITY

Common Femoral Vein: No evidence of thrombus. Normal
compressibility, respiratory phasicity and response to augmentation.

Saphenofemoral Junction: No evidence of thrombus. Normal
compressibility and flow on color Doppler imaging.

Profunda Femoral Vein: No evidence of thrombus. Normal
compressibility and flow on color Doppler imaging.

Femoral Vein: No evidence of thrombus. Normal compressibility,
respiratory phasicity and response to augmentation.

Popliteal Vein: No evidence of thrombus. Normal compressibility,
respiratory phasicity and response to augmentation.

Calf Veins: No evidence of thrombus. Normal compressibility and flow
on color Doppler imaging.

Superficial Great Saphenous Vein: No evidence of thrombus. Normal
compressibility.

Venous Reflux:  None.

Other Findings:  None.
IMPRESSION: No evidence of deep venous thrombosis bilaterally.

## 2018-03-16 NOTE — Telephone Encounter (Signed)
Per Dr. Walden Field request, I called patient to let her know the doppler study on her legs was negative for blood clots. Also Dr. Walden Field wants to know if the patient would rather follow up with a cardiologist, vascular MD, or her PCP concerning her legs. Patient states she will follow up with PCP. She is scheduled to see him the first of September.

## 2018-03-20 ENCOUNTER — Other Ambulatory Visit: Payer: Self-pay | Admitting: Gastroenterology

## 2018-03-21 NOTE — Telephone Encounter (Signed)
Pt is aware that for additional refills, she will need an appointment.

## 2018-03-21 NOTE — Telephone Encounter (Signed)
Lmom, waiting on a return call.  

## 2018-03-21 NOTE — Telephone Encounter (Signed)
Last seen 02/2016. Needs ov. Limited refills provided.

## 2018-03-27 ENCOUNTER — Encounter (HOSPITAL_COMMUNITY): Payer: Medicare HMO

## 2018-03-28 ENCOUNTER — Encounter: Payer: Self-pay | Admitting: Cardiovascular Disease

## 2018-04-02 ENCOUNTER — Encounter (HOSPITAL_COMMUNITY): Payer: Medicare HMO

## 2018-04-02 ENCOUNTER — Encounter (HOSPITAL_COMMUNITY)
Admission: RE | Admit: 2018-04-02 | Discharge: 2018-04-02 | Disposition: A | Discharge status: Home / Self Care | Payer: Medicare HMO | Source: Ambulatory Visit | Attending: Internal Medicine | Admitting: Internal Medicine

## 2018-04-02 ENCOUNTER — Ambulatory Visit (HOSPITAL_COMMUNITY)
Admission: RE | Admit: 2018-04-02 | Discharge: 2018-04-02 | Disposition: A | Discharge status: Home / Self Care | Payer: Medicare HMO | Source: Ambulatory Visit | Attending: Internal Medicine | Admitting: Internal Medicine

## 2018-04-02 DIAGNOSIS — Z710 Person encountering health services to consult on behalf of another person: Secondary | ICD-10-CM | POA: Diagnosis not present

## 2018-04-02 DIAGNOSIS — M7989 Other specified soft tissue disorders: Secondary | ICD-10-CM | POA: Diagnosis not present

## 2018-04-02 DIAGNOSIS — M545 Low back pain, unspecified: Secondary | ICD-10-CM

## 2018-04-02 DIAGNOSIS — C50412 Malignant neoplasm of upper-outer quadrant of left female breast: Secondary | ICD-10-CM | POA: Insufficient documentation

## 2018-04-02 DIAGNOSIS — Z170 Estrogen receptor positive status [ER+]: Secondary | ICD-10-CM

## 2018-04-02 DIAGNOSIS — G8929 Other chronic pain: Secondary | ICD-10-CM | POA: Diagnosis not present

## 2018-04-02 IMAGING — NM NM BONE WHOLE BODY
2 series · 2 of 2 positions shown · non-contrast
Comparison: Radiographs of the pelvis dated [DATE]

CLINICAL DATA: Carcinoma of the upper outer quadrant of the left
breast diagnosed in [AM]. Restaging. Chronic bilateral low back
pain.

EXAM:
NUCLEAR MEDICINE WHOLE BODY BONE SCAN
TECHNIQUE: Whole body anterior and posterior images were obtained approximately
3 hours after intravenous injection of radiopharmaceutical.
RADIOPHARMACEUTICALS:  20.1 mCi [AM] MDP IV

[Series 1: whole body · 2.66mm/px · 1 of 1 slices shown (1 of 2)]
[im 1/1]
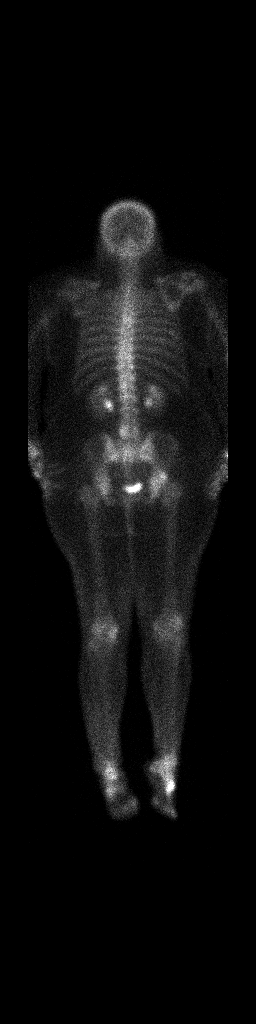

[Series 1: whole body · 2.66mm/px · 1 of 1 slices shown (2 of 2)]
[im 1/1]
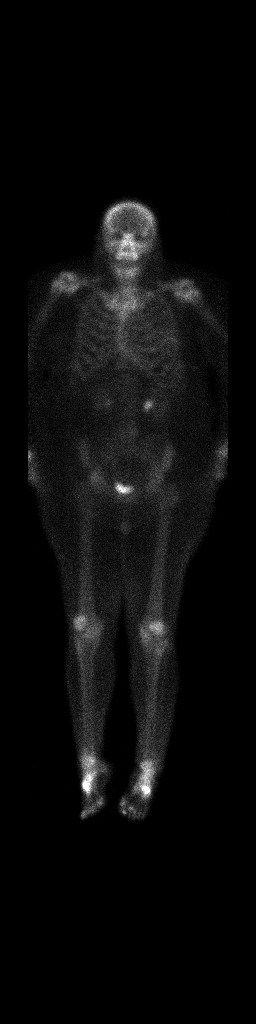

[2 of 2 positions shown; findings below may reference images not displayed]

FINDINGS: There is slight increased activity at the superolateral aspect of
acetabulum of the right hip. There is slight increased activity at
the left facet joint at L4-5.

There is also increased activity in the midfoot bilaterally.

No other significant findings.
IMPRESSION: Focal abnormalities in the right hip acetabulum, at L4-5 on the left
and in the bilateral feet. These are most likely arthritic in
origin.

Radiographs of the lumbar spine and right hip are recommended for
correlation.

## 2018-04-02 MED ORDER — SODIUM CHLORIDE 0.9% FLUSH
INTRAVENOUS | Status: AC
  Filled 2018-04-02: qty 160

## 2018-04-02 MED ORDER — TECHNETIUM TC 99M MEDRONATE IV KIT
20.0000 | PACK | Freq: Once | INTRAVENOUS | Status: AC | PRN
  Administered 2018-04-02: 20.1 via INTRAVENOUS

## 2018-04-04 ENCOUNTER — Telehealth (HOSPITAL_COMMUNITY): Payer: Self-pay

## 2018-04-04 ENCOUNTER — Other Ambulatory Visit (HOSPITAL_COMMUNITY): Payer: Self-pay | Admitting: Pulmonary Disease

## 2018-04-04 ENCOUNTER — Other Ambulatory Visit (HOSPITAL_COMMUNITY): Payer: Self-pay | Admitting: Internal Medicine

## 2018-04-04 DIAGNOSIS — R609 Edema, unspecified: Secondary | ICD-10-CM

## 2018-04-04 DIAGNOSIS — Z170 Estrogen receptor positive status [ER+]: Secondary | ICD-10-CM

## 2018-04-04 DIAGNOSIS — M25559 Pain in unspecified hip: Secondary | ICD-10-CM

## 2018-04-04 DIAGNOSIS — C50412 Malignant neoplasm of upper-outer quadrant of left female breast: Secondary | ICD-10-CM

## 2018-04-04 NOTE — Telephone Encounter (Signed)
Patient called for bone scan results. Reviewed with Dr. Walden Field. Notified patient that the scans showed arthritic changes most likely but the radiologist wanted plain films to correlate. She can come anytime to have the xrays. She does not need an appt. Patient verbalized understanding.

## 2018-04-06 ENCOUNTER — Other Ambulatory Visit (HOSPITAL_COMMUNITY): Payer: Medicare HMO

## 2018-04-09 ENCOUNTER — Other Ambulatory Visit (HOSPITAL_COMMUNITY): Payer: Medicare HMO

## 2018-04-11 ENCOUNTER — Inpatient Hospital Stay (HOSPITAL_COMMUNITY): Admission: RE | Admit: 2018-04-11 | Payer: Medicare HMO | Source: Ambulatory Visit

## 2018-04-19 ENCOUNTER — Other Ambulatory Visit (HOSPITAL_COMMUNITY): Payer: Self-pay | Admitting: Hematology

## 2018-04-19 DIAGNOSIS — C50412 Malignant neoplasm of upper-outer quadrant of left female breast: Secondary | ICD-10-CM

## 2018-04-23 ENCOUNTER — Other Ambulatory Visit (HOSPITAL_COMMUNITY): Payer: Self-pay | Admitting: *Deleted

## 2018-04-23 DIAGNOSIS — C50412 Malignant neoplasm of upper-outer quadrant of left female breast: Secondary | ICD-10-CM

## 2018-04-23 MED ORDER — TAMOXIFEN CITRATE 20 MG PO TABS: 20.0000 mg | ORAL_TABLET | Freq: Every day | ORAL | 3 refills | Status: DC

## 2018-04-26 ENCOUNTER — Other Ambulatory Visit (HOSPITAL_COMMUNITY): Payer: Medicare HMO

## 2018-05-07 ENCOUNTER — Ambulatory Visit: Payer: Medicare HMO | Admitting: Cardiovascular Disease

## 2018-05-07 ENCOUNTER — Encounter: Payer: Self-pay | Admitting: Cardiovascular Disease

## 2018-05-07 VITALS — BP 136/84 | HR 80 | Ht 63.0 in | Wt 247.0 lb

## 2018-05-07 DIAGNOSIS — Z720 Tobacco use: Secondary | ICD-10-CM | POA: Diagnosis not present

## 2018-05-07 DIAGNOSIS — I83893 Varicose veins of bilateral lower extremities with other complications: Secondary | ICD-10-CM | POA: Diagnosis not present

## 2018-05-07 DIAGNOSIS — J449 Chronic obstructive pulmonary disease, unspecified: Secondary | ICD-10-CM | POA: Diagnosis not present

## 2018-05-07 NOTE — Progress Notes (Signed)
CARDIOLOGY CONSULT NOTE  Patient ID: Regina Mcmahon MRN: [id number] DOB/AGE: Mar 14, 1951 67 y.o.  Admit date: (Not on file) Primary Physician: Sinda Du, MD Referring Physician: Dr. Zoila Shutter  Reason for Consultation: Venous varicosities  HPI: Regina Mcmahon is a 67 y.o. female who is being seen today for the evaluation of venous varicosities at the request of Higgs, Mathis Dad, MD.   She has a past medical history of stage IIb invasive ductal carcinoma initially diagnosed in December 2015.  She has undergone lumpectomy, chemotherapy, and radiation therapy. She also has a history of obesity, tobacco abuse, and COPD.  She is noted to have extensive lower extremity venous varicosities.  Lower extremity Dopplers on 03/16/2018 did not demonstrate any evidence of DVT bilaterally.  ECG performed in the office today which I ordered and personally interpreted demonstrates normal sinus rhythm with low voltage and late R wave transition.  She tells me she has had varicose veins in both legs since at least her 57s.  She never wear shorts.  There is an aching sensation in both legs when she walks.  She is to work standing up on concrete floors for long periods of time.  She also smokes 2 packs of cigarettes daily and began in her 34s.  She denies chest pain.  She only gets palpitations when she is stressed out.  She denies claudication pain.  She does have some arthritic right hip pain.     No Known Allergies  Current Outpatient Medications  Medication Sig Dispense Refill  . albuterol (PROVENTIL HFA;VENTOLIN HFA) 108 (90 BASE) MCG/ACT inhaler Inhale 2 puffs into the lungs every 6 (six) hours as needed for wheezing or shortness of breath.    . ALPRAZolam (XANAX) 0.5 MG tablet Take 0.5 mg by mouth 3 (three) times daily as needed for anxiety.    Marland Kitchen amoxicillin (AMOXIL) 500 MG capsule Take 500 mg by mouth 3 (three) times daily.    . calcium carbonate (TUMS - DOSED IN MG ELEMENTAL  CALCIUM) 500 MG chewable tablet Chew 1 tablet by mouth 3 (three) times daily with meals. Reported on 12/07/2015    . hydrochlorothiazide (MICROZIDE) 12.5 MG capsule Take 12.5 mg by mouth daily. Reported on 12/07/2015    . ibuprofen (ADVIL,MOTRIN) 200 MG tablet Take 400 mg by mouth every 8 (eight) hours.    . pantoprazole (PROTONIX) 40 MG tablet TAKE 1 TABLET BY MOUTH ONCE DAILY 90 tablet 0  . tamoxifen (NOLVADEX) 20 MG tablet Take 1 tablet (20 mg total) by mouth daily. 30 tablet 3   No current facility-administered medications for this visit.     Past Medical History:  Diagnosis Date  . Asthma   . Asymptomatic varicose veins   . Breast cancer ([place]) 06/2014   left  . Chronic airway obstruction ([place])   . COPD (chronic obstructive pulmonary disease) ([place])   . Depression   . Hemorrhage rectum    and anus.  . Hypertension   . Insomnia   . Other symptoms involving digestive system(787.99)   . Pain    Hx.pain in joint involving pelvic region and thigh; pain in limb  . Palpitations   . Panic attacks   . Personal history of chemotherapy 2016  . Personal history of radiation therapy 2016  . Sinusitis   . Symptomatic menopausal or female climacteric states     Past Surgical History:  Procedure Laterality Date  . BREAST BIOPSY Left 10/2012  . BREAST BIOPSY Left  07/01/2014   malignant  . BREAST LUMPECTOMY Left 08/20/2014  . CESAREAN SECTION    . CHOLECYSTECTOMY  1985  . COLONOSCOPY  2002   Dr. Lindalou Hose: internal hemorrhoids, one small rectal polyp, adenomatous path   . COLONOSCOPY N/A 11/23/2015   Procedure: COLONOSCOPY;  Surgeon: Danie Binder, MD;  [facility];  Service: Endoscopy;  Laterality: N/A;  1100 - moved to 10:45 - office to notify  . ESOPHAGOGASTRODUODENOSCOPY N/A 11/23/2015   Procedure: ESOPHAGOGASTRODUODENOSCOPY (EGD);  Surgeon: Danie Binder, MD;  [facility];  Service: Endoscopy;  Laterality: N/A;  . PORT-A-CATH REMOVAL  02/2015  . PORTACATH  PLACEMENT Right 09/17/2014   Procedure: INSERTION PORT-A-CATH WITH ULTRASOUND;  Surgeon: Erroll Luna, MD;  Location: [place];  Service: General;  Laterality: Right;  IJ  . RADIOACTIVE SEED GUIDED PARTIAL MASTECTOMY WITH AXILLARY SENTINEL LYMPH NODE BIOPSY Left 08/20/2014   Procedure: LEFT BREAST LUMPECTOMY WITH RADIOACTIVE SEED LOCALIZATION AND SENTINEL LYMPH NODE MAPPING;  Surgeon: Erroll Luna, MD;  Location: [facility];  Service: General;  Laterality: Left;  . RE-EXCISION OF BREAST LUMPECTOMY Left 09/17/2014   Procedure: RE-EXCISION OF BREAST LUMPECTOMY;  Surgeon: Erroll Luna, MD;  Location: [place];  Service: General;  Laterality: Left;  . TUBAL LIGATION      Social History   Socioeconomic History  . Marital status: Divorced    Spouse name: Not on file  . Number of children: 2  . Years of education: Not on file  . Highest education level: Not on file  Occupational History  . Not on file  Social Needs  . Financial resource strain: Not on file  . Food insecurity:    Worry: Not on file    Inability: Not on file  . Transportation needs:    Medical: Not on file    Non-medical: Not on file  Tobacco Use  . Smoking status: Former Smoker    Packs/day: 2.00    Types: Cigarettes    Last attempt to quit: 02/03/2016    Years since quitting: 2.2  . Smokeless tobacco: Never Used  Substance and Sexual Activity  . Alcohol use: No    Alcohol/week: 0.0 standard drinks  . Drug use: No  . Sexual activity: Never    Birth control/protection: None  Lifestyle  . Physical activity:    Days per week: Not on file    Minutes per session: Not on file  . Stress: Not on file  Relationships  . Social connections:    Talks on phone: Not on file    Gets together: Not on file    Attends religious service: Not on file    Active member of club or organization: Not on file    Attends meetings of clubs or organizations: Not on file     Relationship status: Not on file  . Intimate partner violence:    Fear of current or ex partner: Not on file    Emotionally abused: Not on file    Physically abused: Not on file    Forced sexual activity: Not on file  Other Topics Concern  . Not on file  Social History Narrative  . Not on file     No family history of premature CAD in 1st degree relatives.  No outpatient medications have been marked as taking for the 05/07/18 encounter (Office Visit) with Herminio Commons, MD.      Review of systems complete and found to be  negative unless listed above in HPI    Physical exam Blood pressure 136/84, pulse 80, height 5\' 3"  (1.6 m), weight 247 lb (112 kg), SpO2 94 %. General: NAD Neck: No JVD, no thyromegaly or thyroid nodule.  Lungs: Poor air movement throughout, no crackles or wheezes. CV: Nondisplaced PMI. Regular rate and rhythm, normal S1/S2, no D9/I3, soft systolic murmur along left sternal border.  Bilateral lower extremity venous varicosities with trace peripheral edema.  No carotid bruit.    Abdomen: Soft, nontender, no distention.  Skin: Intact without lesions or rashes.  Neurologic: Alert and oriented x 3.  Psych: Normal affect. Extremities: No clubbing or cyanosis.  HEENT: Normal.   ECG: Most recent ECG reviewed.   Labs: Lab Results  Component Value Date/Time   K 4.4 03/08/2018 12:42 PM   BUN 15 03/08/2018 12:42 PM   CREATININE 0.86 03/08/2018 12:42 PM   ALT 29 03/08/2018 12:42 PM   HGB 16.9 (H) 03/08/2018 12:42 PM     Lipids: No results found for: LDLCALC, LDLDIRECT, CHOL, TRIG, HDL      ASSESSMENT AND PLAN:   1.  Venous varicosities: No DVT seen by lower extreme Dopplers on 03/16/2018.  She does have symptoms including bilateral leg aching with exertion and swelling.  I will prescribe knee-high compression stockings.  I will make a referral to vascular surgery to see if she is a candidate for venous ablation.  2.  Tobacco abuse: She has been  smoking 2 packs of cigarettes daily since her 81s.  3.  COPD: Symptomatically stable.  She needs tobacco cessation.    Disposition: Follow up with me as needed  Signed: Kate Sable, M.D., F.A.C.C.  05/07/2018, 8:52 AM

## 2018-05-07 NOTE — Patient Instructions (Signed)
Medication Instructions:  Your physician recommends that you continue on your current medications as directed. Please refer to the Current Medication list given to you today.  If you need a refill on your cardiac medications before your next appointment, please call your pharmacy.   Lab work: NONE  If you have labs (blood work) drawn today and your tests are completely normal, you will receive your results only by: Marland Kitchen MyChart Message (if you have MyChart) OR . A paper copy in the mail If you have any lab test that is abnormal or we need to change your treatment, we will call you to review the results.  Testing/Procedures: NONE   Follow-Up: At [facility], you and your health needs are our priority.  As part of our continuing mission to provide you with exceptional heart care, we have created designated Provider Care Teams.  These Care Teams include your primary Cardiologist (physician) and Advanced Practice Providers (APPs -  Physician Assistants and Nurse Practitioners) who all work together to provide you with the care you need, when you need it. You will need a follow up appointment as needed. Please call our office 2 months in advance to schedule this appointment.  You may see Kate Sable, MD or one of the following Advanced Practice Providers on your designated Care Team:   Bernerd Pho, PA-C [facility]) . Ermalinda Barrios, PA-C ([place])  Any Other Special Instructions Will Be Listed Below (If Applicable). You have been given a Rx for compression stockings.  Thank you for choosing [place]!

## 2018-06-22 ENCOUNTER — Other Ambulatory Visit: Payer: Self-pay | Admitting: Gastroenterology

## 2018-07-06 ENCOUNTER — Other Ambulatory Visit: Payer: Self-pay

## 2018-07-06 DIAGNOSIS — Z800 Family history of malignant neoplasm of digestive organs: Secondary | ICD-10-CM

## 2018-07-06 DIAGNOSIS — I83893 Varicose veins of bilateral lower extremities with other complications: Secondary | ICD-10-CM

## 2018-07-30 ENCOUNTER — Encounter (HOSPITAL_COMMUNITY): Payer: Medicare HMO

## 2018-07-30 ENCOUNTER — Encounter: Payer: Medicare HMO | Admitting: Surgery

## 2018-10-01 ENCOUNTER — Other Ambulatory Visit (HOSPITAL_COMMUNITY): Payer: Self-pay | Admitting: Internal Medicine

## 2018-10-01 DIAGNOSIS — C50412 Malignant neoplasm of upper-outer quadrant of left female breast: Secondary | ICD-10-CM

## 2018-10-01 DIAGNOSIS — M545 Low back pain, unspecified: Secondary | ICD-10-CM

## 2018-10-01 DIAGNOSIS — Z170 Estrogen receptor positive status [ER+]: Secondary | ICD-10-CM

## 2018-10-01 DIAGNOSIS — M7989 Other specified soft tissue disorders: Secondary | ICD-10-CM

## 2018-10-01 DIAGNOSIS — G8929 Other chronic pain: Secondary | ICD-10-CM

## 2018-10-16 ENCOUNTER — Other Ambulatory Visit (HOSPITAL_COMMUNITY): Payer: Medicare HMO

## 2018-10-16 ENCOUNTER — Encounter (HOSPITAL_COMMUNITY): Payer: Medicare HMO

## 2018-10-16 ENCOUNTER — Encounter: Payer: Self-pay | Admitting: Gastroenterology

## 2018-10-22 ENCOUNTER — Ambulatory Visit (HOSPITAL_COMMUNITY): Payer: Medicare HMO | Admitting: Nurse Practitioner

## 2018-11-13 ENCOUNTER — Encounter (HOSPITAL_COMMUNITY): Payer: Medicare HMO

## 2018-11-13 ENCOUNTER — Ambulatory Visit (HOSPITAL_COMMUNITY): Payer: Medicare HMO

## 2018-11-13 ENCOUNTER — Other Ambulatory Visit (HOSPITAL_COMMUNITY): Payer: Medicare HMO

## 2018-11-14 ENCOUNTER — Encounter: Payer: Self-pay | Admitting: Gastroenterology

## 2018-11-20 ENCOUNTER — Ambulatory Visit (HOSPITAL_COMMUNITY): Payer: Medicare HMO | Admitting: Nurse Practitioner

## 2018-12-18 ENCOUNTER — Other Ambulatory Visit (HOSPITAL_COMMUNITY): Payer: Self-pay | Admitting: Pulmonary Disease

## 2018-12-18 ENCOUNTER — Ambulatory Visit (HOSPITAL_COMMUNITY): Payer: Medicare HMO

## 2018-12-18 DIAGNOSIS — M545 Low back pain, unspecified: Secondary | ICD-10-CM

## 2018-12-18 DIAGNOSIS — M25561 Pain in right knee: Secondary | ICD-10-CM

## 2018-12-18 DIAGNOSIS — M25551 Pain in right hip: Secondary | ICD-10-CM

## 2018-12-20 ENCOUNTER — Other Ambulatory Visit: Payer: Self-pay | Admitting: Pulmonary Disease

## 2018-12-20 ENCOUNTER — Other Ambulatory Visit: Payer: Self-pay

## 2018-12-20 ENCOUNTER — Ambulatory Visit (HOSPITAL_COMMUNITY)
Admission: RE | Admit: 2018-12-20 | Discharge: 2018-12-20 | Disposition: A | Discharge status: Home / Self Care | Payer: Medicare HMO | Source: Ambulatory Visit | Attending: Pulmonary Disease | Admitting: Pulmonary Disease

## 2018-12-20 DIAGNOSIS — M79605 Pain in left leg: Secondary | ICD-10-CM | POA: Insufficient documentation

## 2018-12-20 DIAGNOSIS — M25551 Pain in right hip: Secondary | ICD-10-CM | POA: Insufficient documentation

## 2018-12-20 DIAGNOSIS — M545 Low back pain, unspecified: Secondary | ICD-10-CM

## 2018-12-20 DIAGNOSIS — M25561 Pain in right knee: Secondary | ICD-10-CM | POA: Insufficient documentation

## 2018-12-20 IMAGING — DX DG HIP (WITH OR WITHOUT PELVIS) 5+V BILAT
5 series · 5 of 5 positions shown · non-contrast
Comparison: None.

CLINICAL DATA: Low back pain with hip pain. History of breast
cancer

EXAM:
DG HIP (WITH OR WITHOUT PELVIS) 5+V BILAT

[pelvis ap]
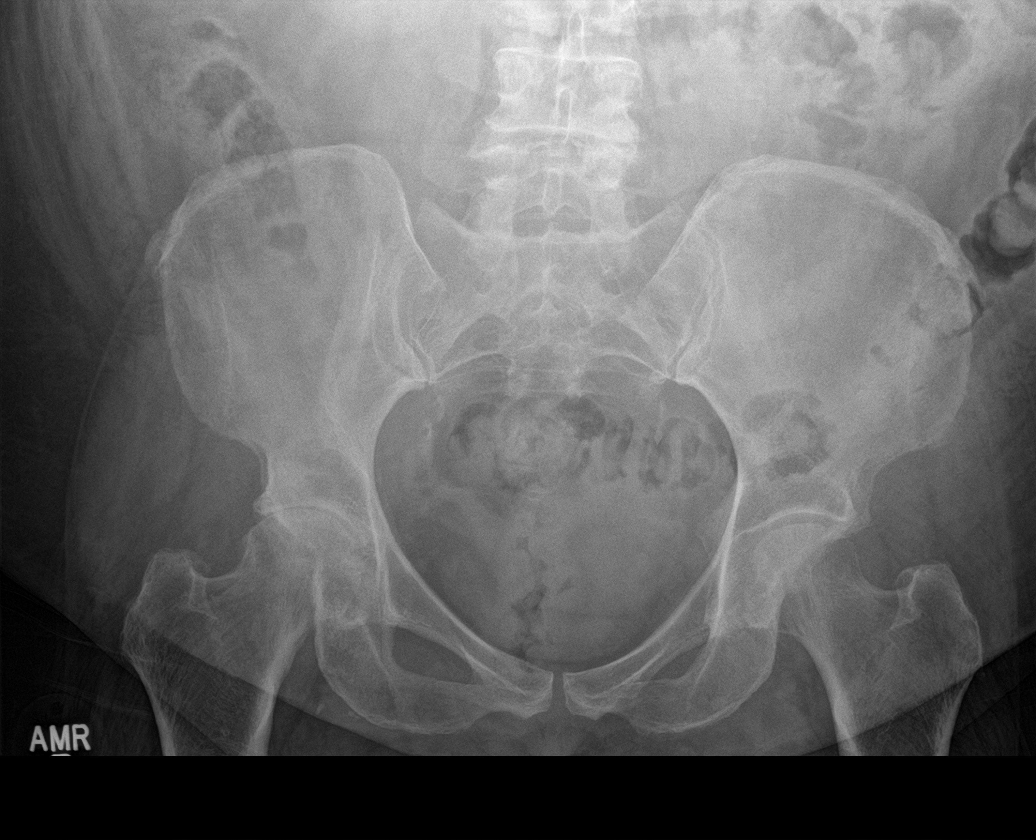

[hip ap (1 of 2)]
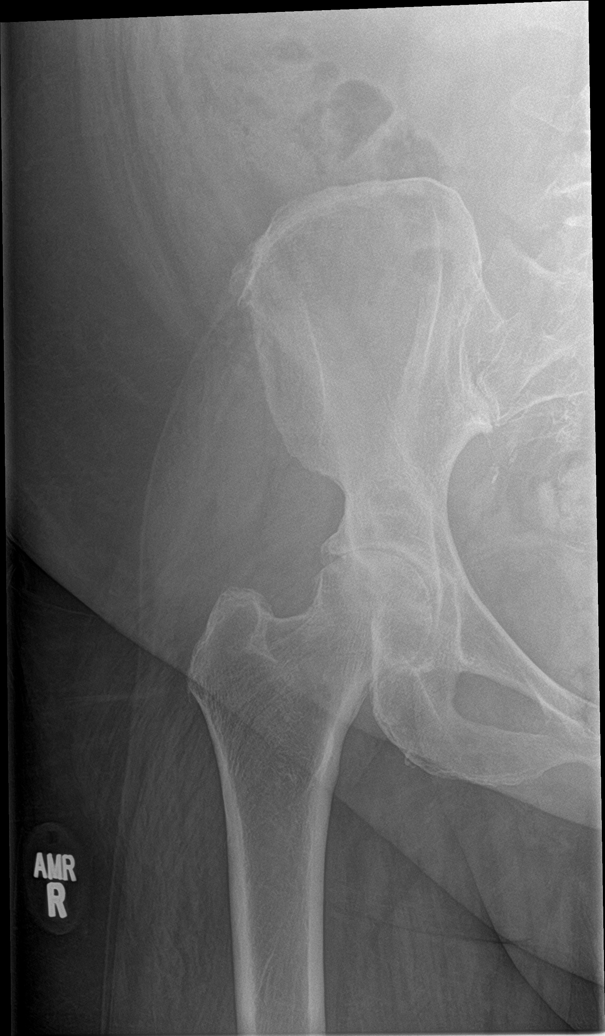

[hip lat (1 of 2)]
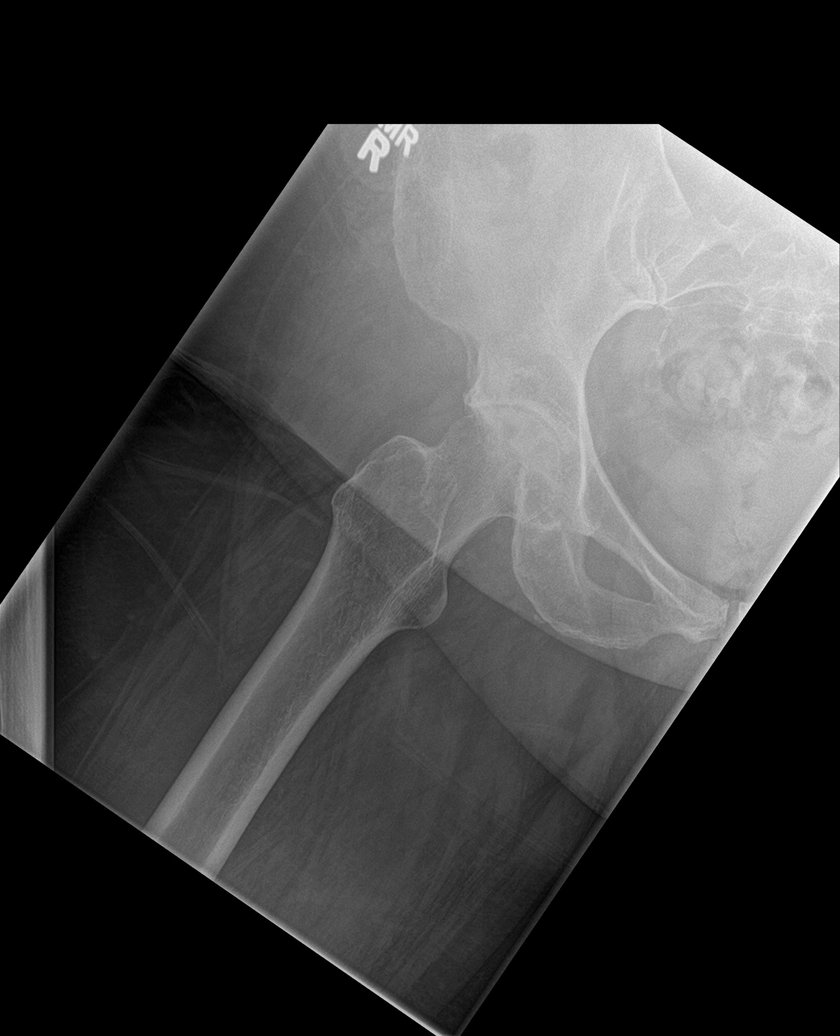

[hip ap (2 of 2)]
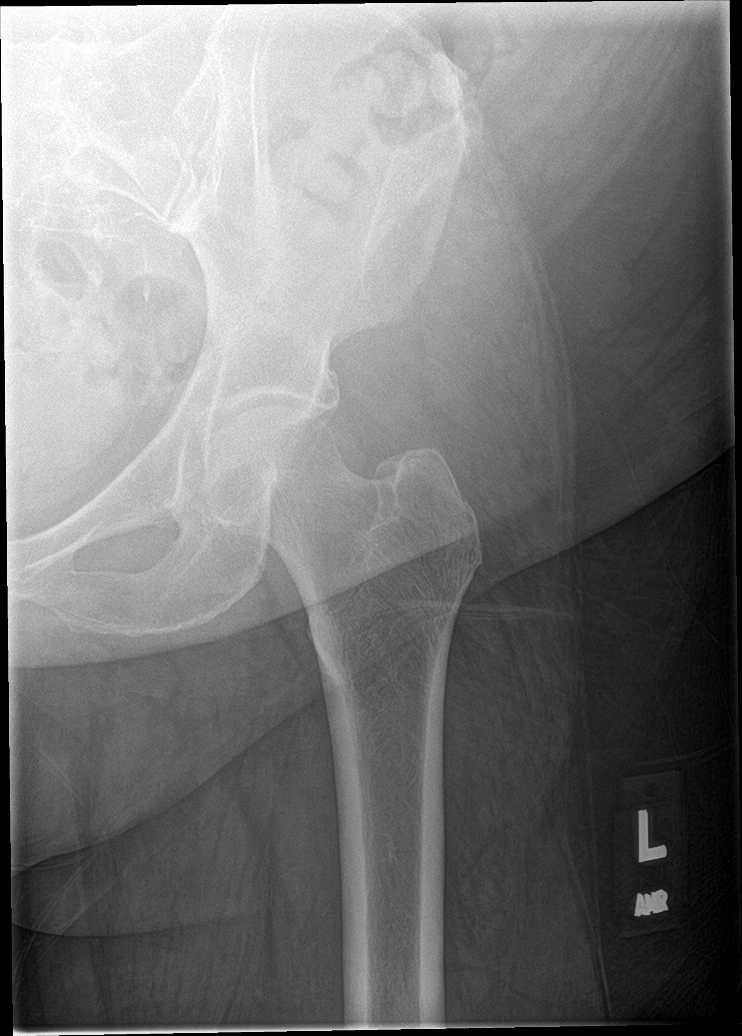

[hip lat (2 of 2)]
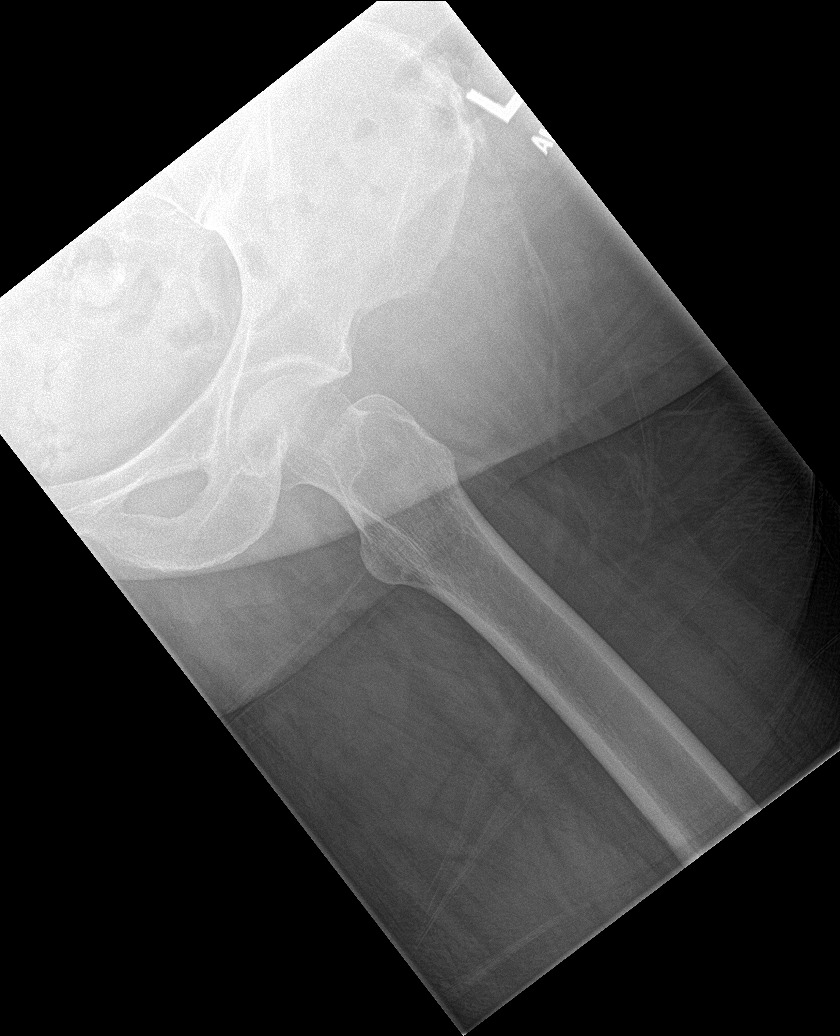

[5 of 5 positions shown; findings below may reference images not displayed]

FINDINGS: Moderate to severe degenerative change in the right hip joint with
joint space narrowing and spurring.

Left hip joint normal.

Negative for fracture or mass.
IMPRESSION: Moderate to advanced degenerative change right hip

## 2018-12-20 IMAGING — DX LUMBAR SPINE - COMPLETE 4+ VIEW
5 series · 5 of 5 positions shown · non-contrast
Comparison: None.

CLINICAL DATA: Chronic low back pain radiating into the right hip
and leg.

EXAM:
LUMBAR SPINE - COMPLETE 4+ VIEW

[l-spine ap]
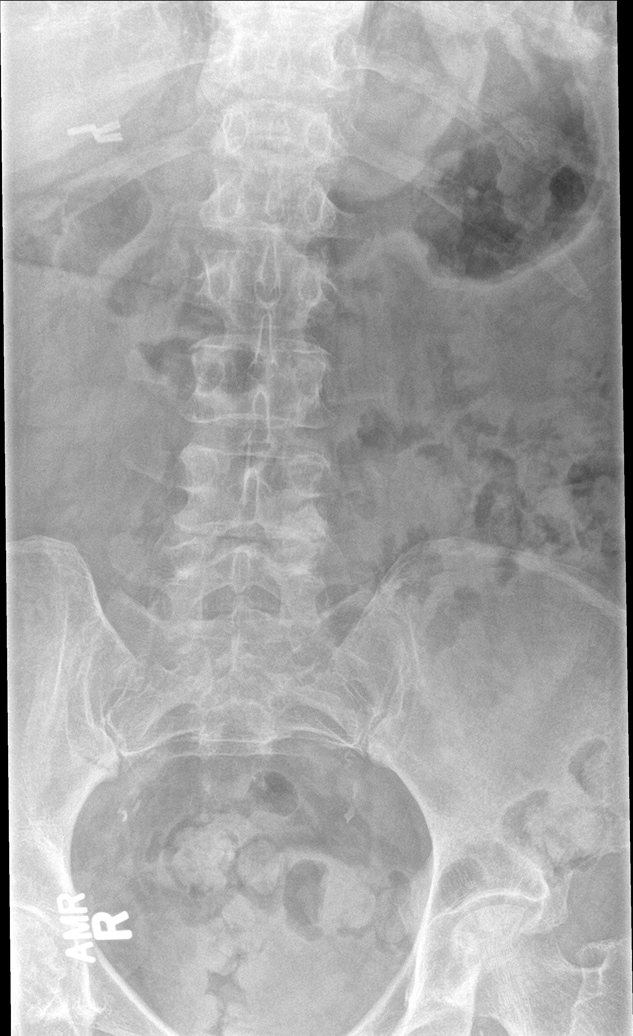

[l-spine obl (1 of 2)]
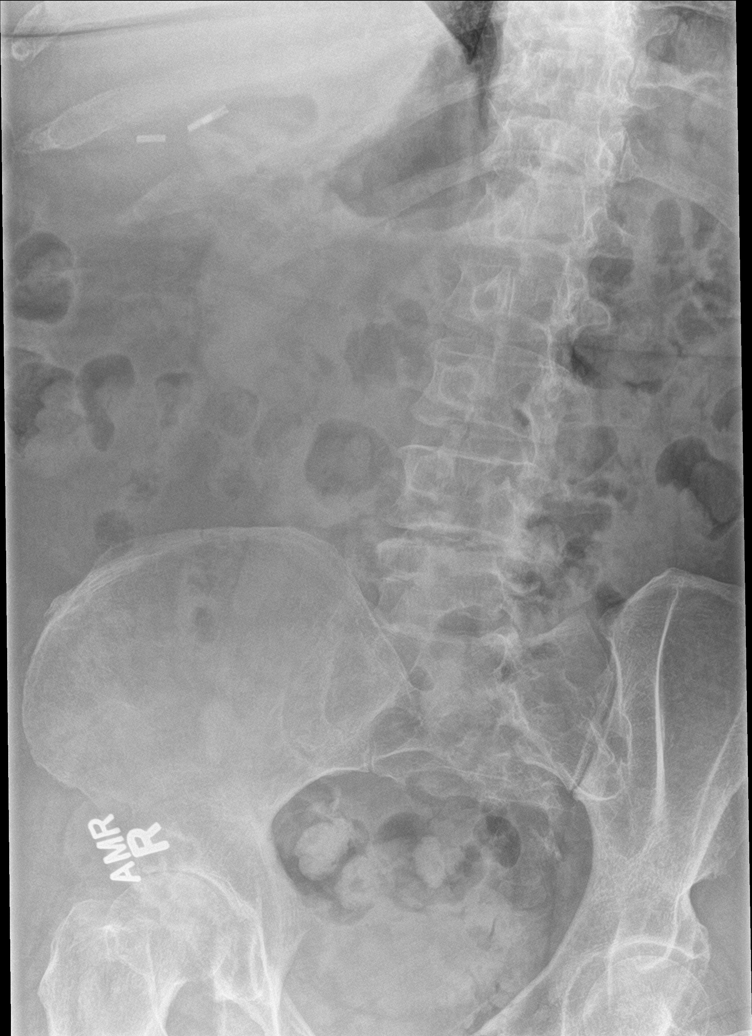

[l-spine obl (2 of 2)]
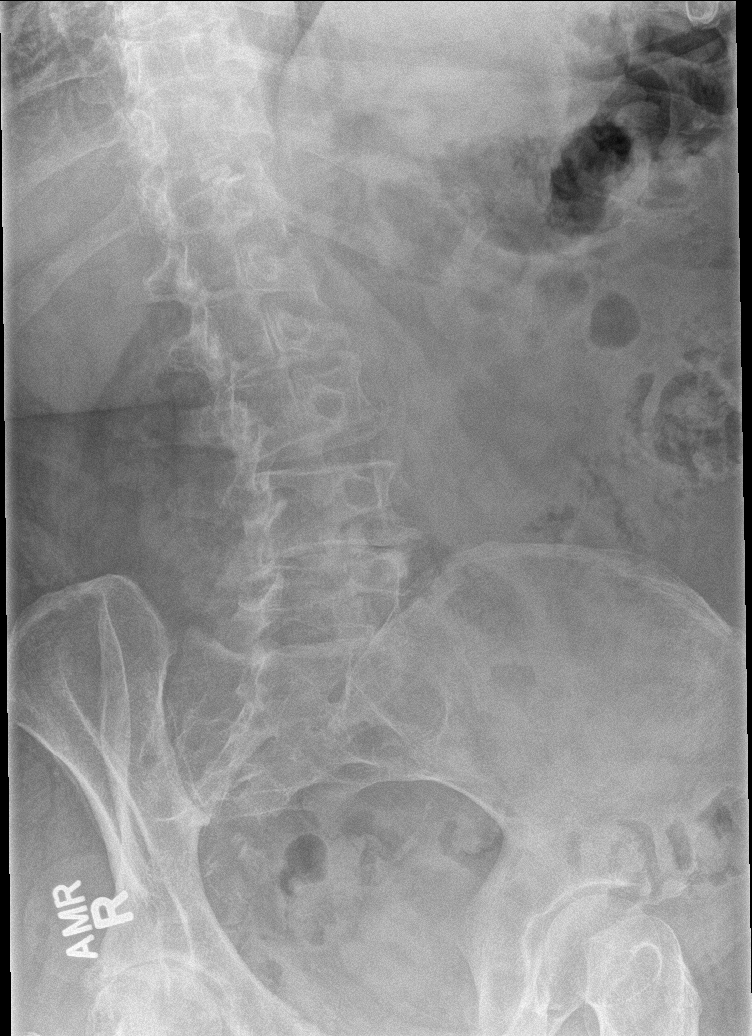

[l-spine lat]
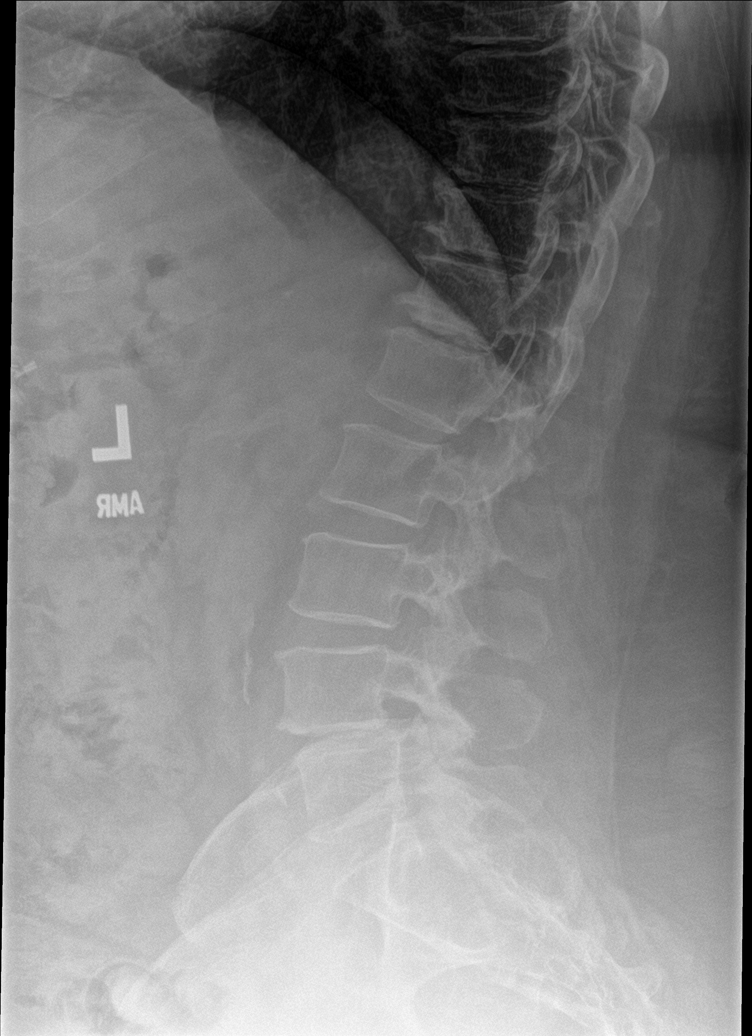

[l-spine spot]
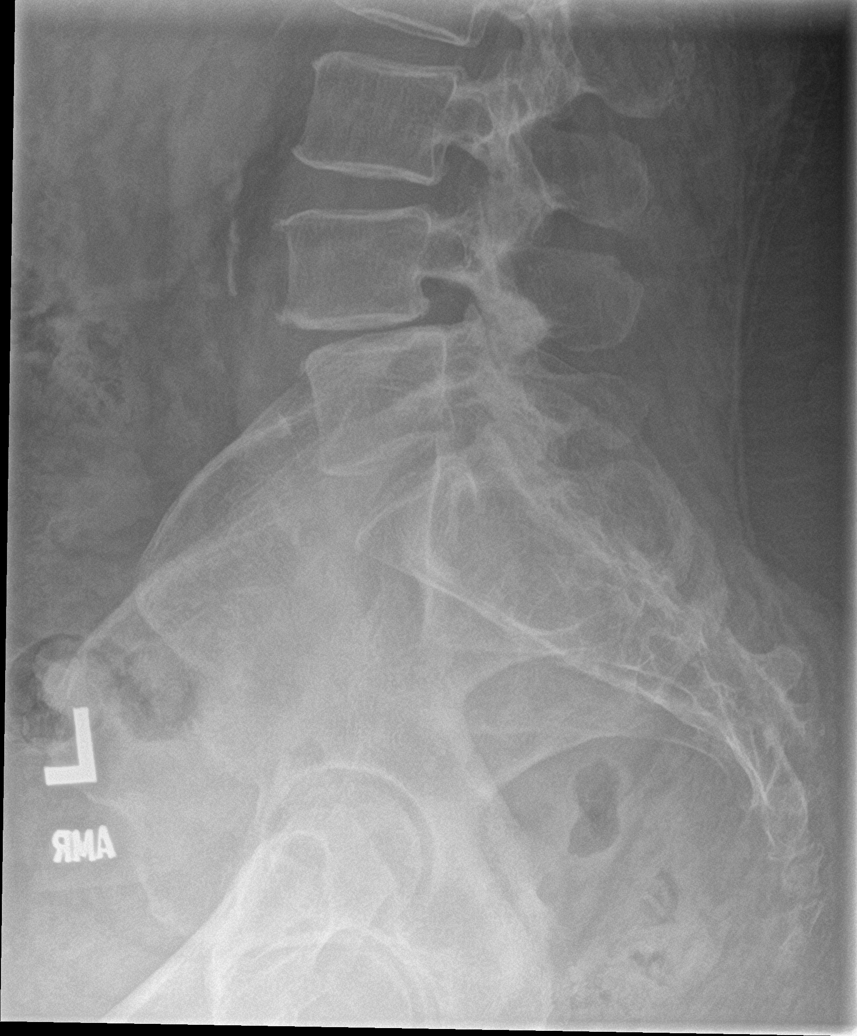

[5 of 5 positions shown; findings below may reference images not displayed]

FINDINGS: Five lumbar type vertebral bodies. No acute fracture or subluxation.
Vertebral body heights are preserved. Facet mediated 5 mm
anterolisthesis at L4-5. Moderate disc height loss at L4-L5. Lower
thoracic degenerative disc disease. The sacroiliac joints are
unremarkable. Moderate right hip osteoarthritis. Aortic
atherosclerosis.
IMPRESSION: 1. Moderate degenerative disc disease and grade 1 anterolisthesis at
L4-L5.

## 2018-12-20 IMAGING — DX RIGHT KNEE - COMPLETE 4+ VIEW
4 series · 4 of 4 positions shown · non-contrast
Comparison: None.

CLINICAL DATA: RIGHT knee pain. Soft tissue protuberance laterally.

EXAM:
RIGHT KNEE - COMPLETE 4+ VIEW

[knee ap]
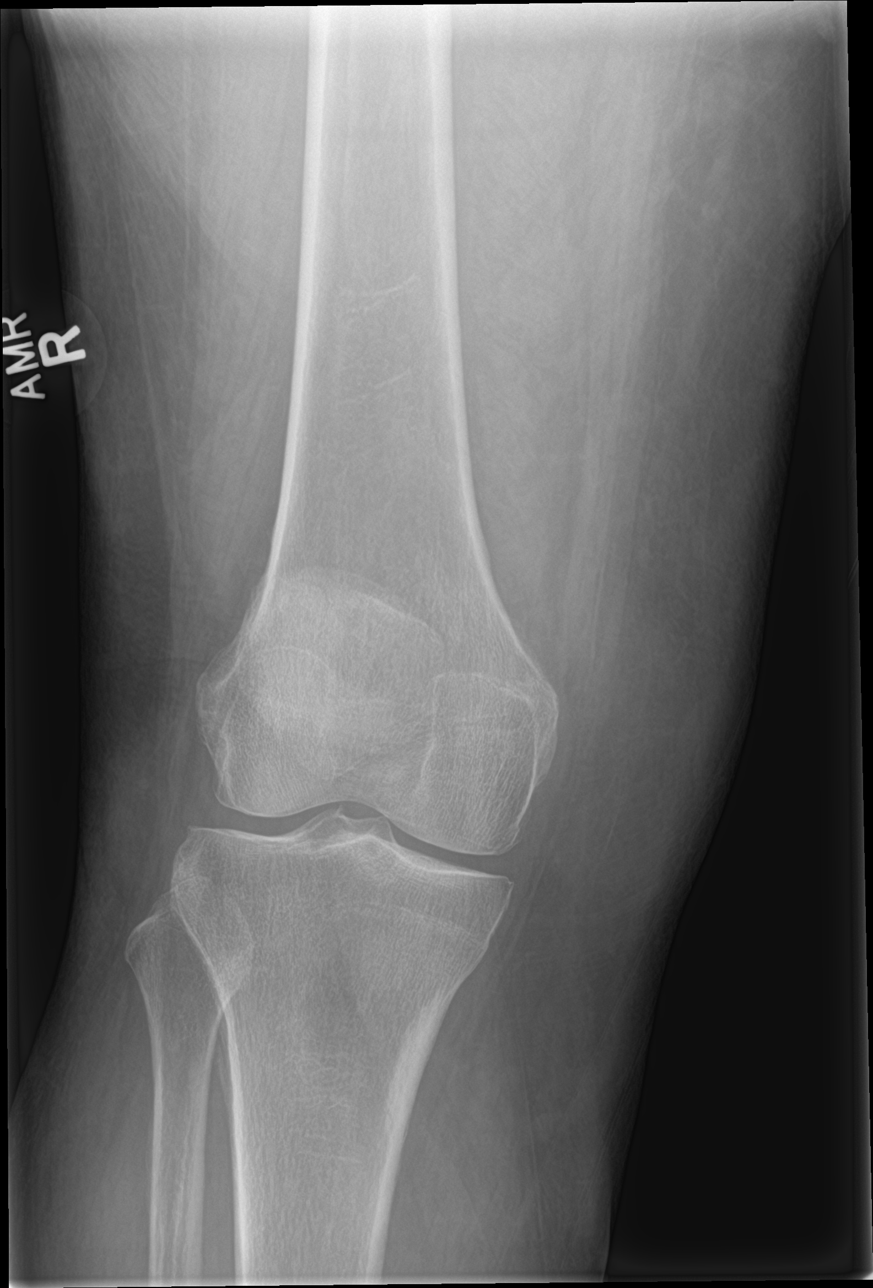

[tunnel]
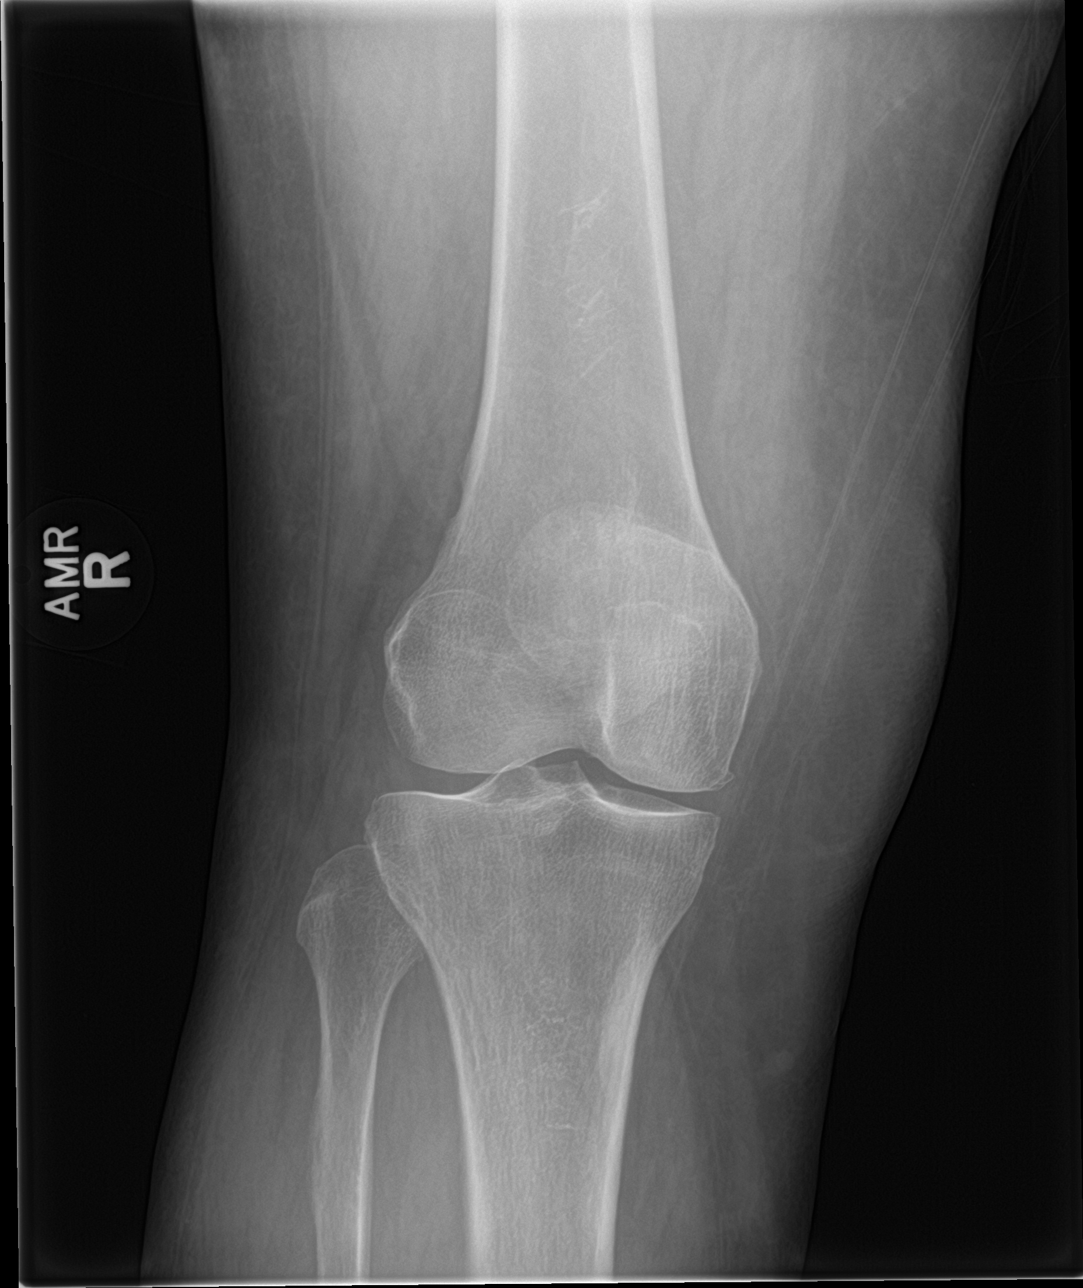

[knee lat]
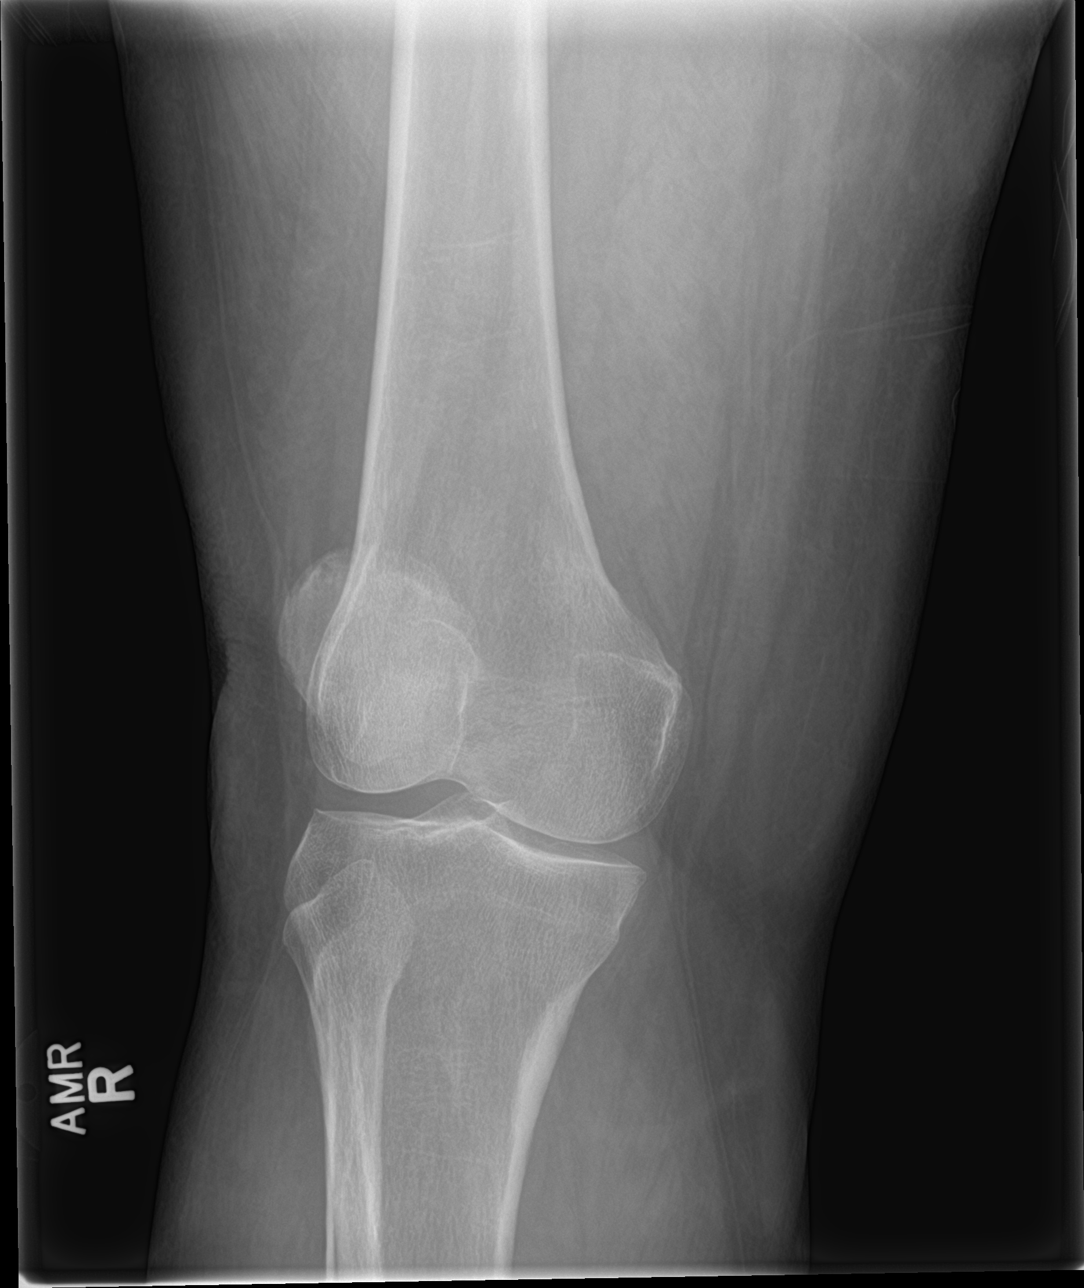

[knee sunrise]
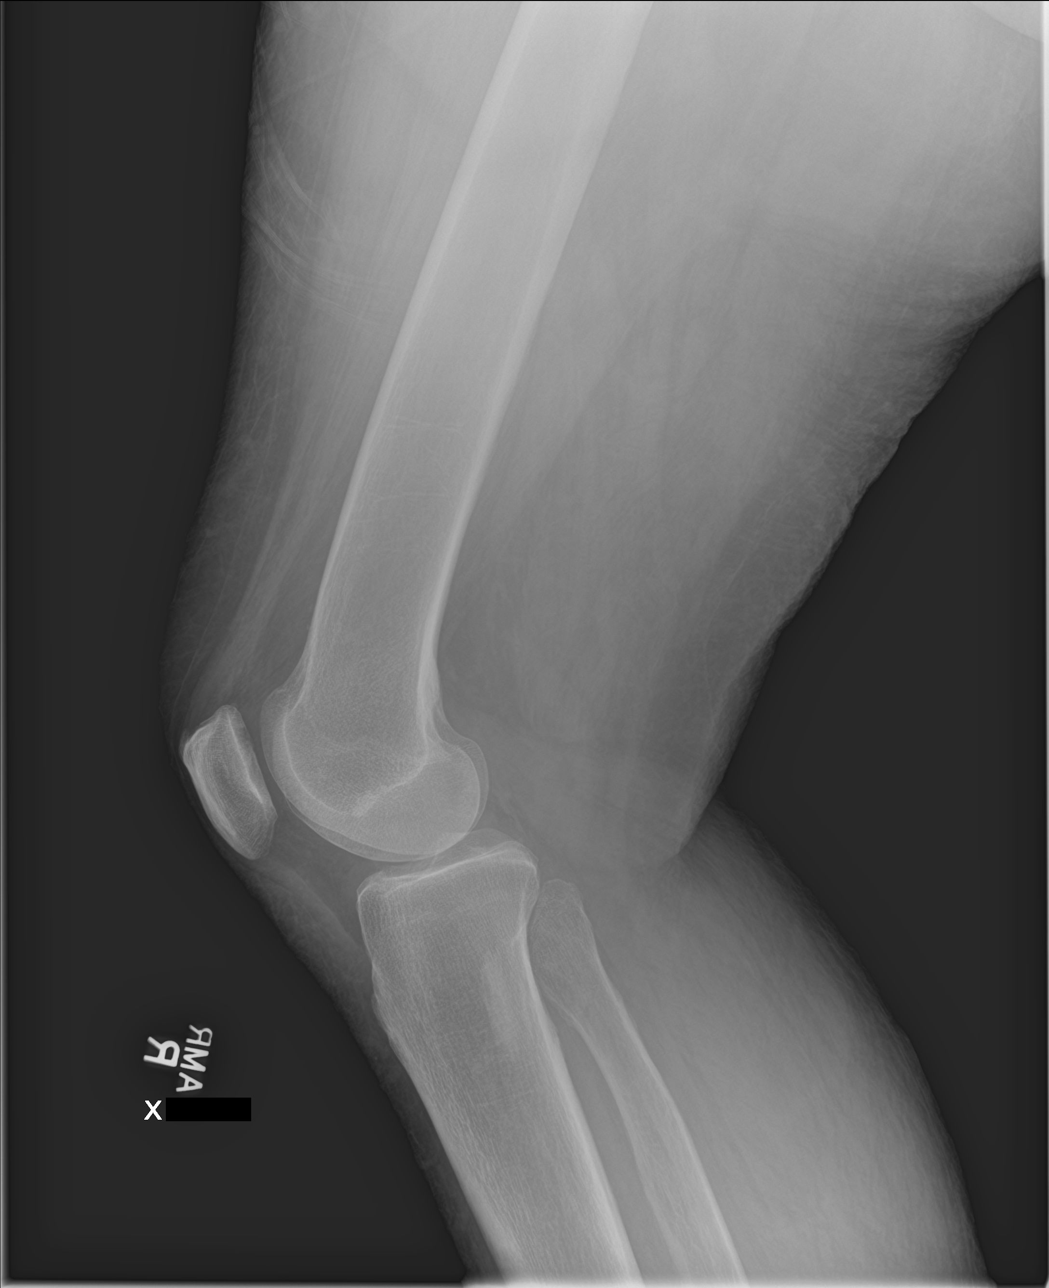

[4 of 4 positions shown; findings below may reference images not displayed]

FINDINGS: No evidence of fracture, dislocation, or joint effusion. No evidence
of arthropathy or other focal bone abnormality. Soft tissues are
unremarkable.
IMPRESSION: Negative.

## 2018-12-20 IMAGING — US VENOUS DOPPLER ULTRASOUND OF LEFT LOWER EXTREMITY
1 series · 13 of 24 positions shown · non-contrast
Comparison: None.

CLINICAL DATA: Left leg pain, breast cancer, varicose veins



[Series 1: venous doppler ultrasound of left lower extremity · 13 of 52 slices shown]
[im 1/52]
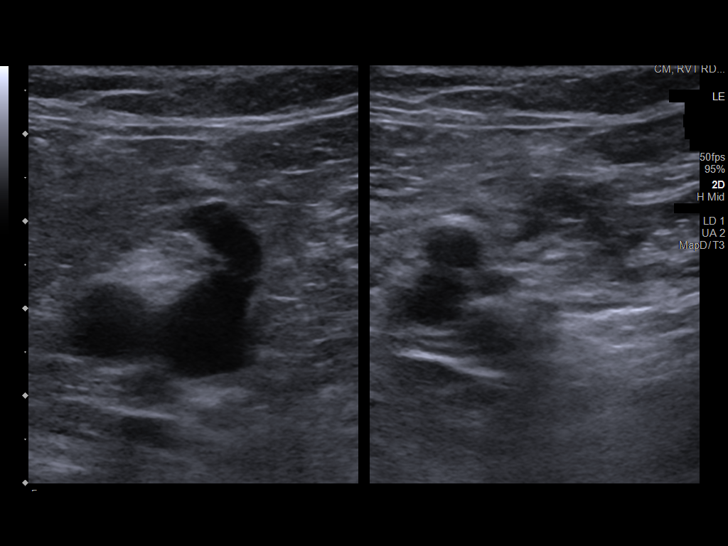
[im 5/52]
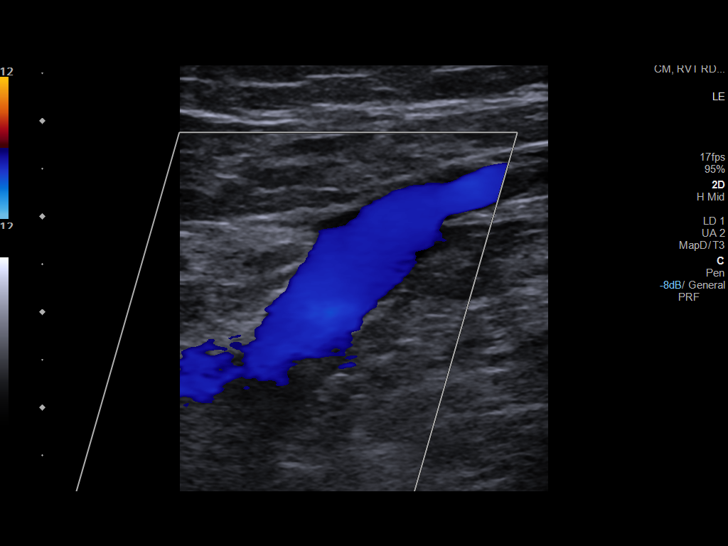
[im 9/52]
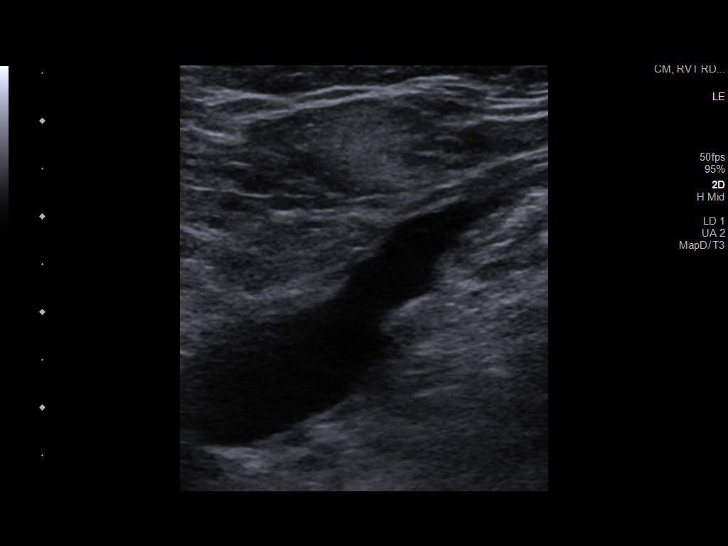
[im 14/52]
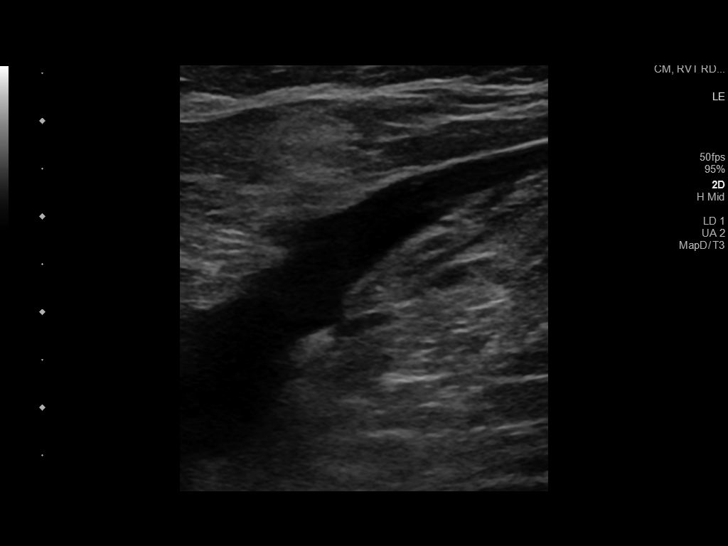
[im 18/52]
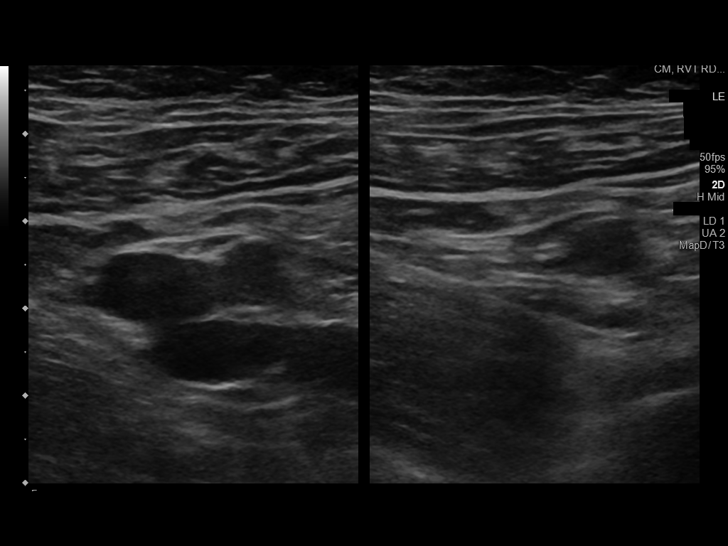
[im 23/52]
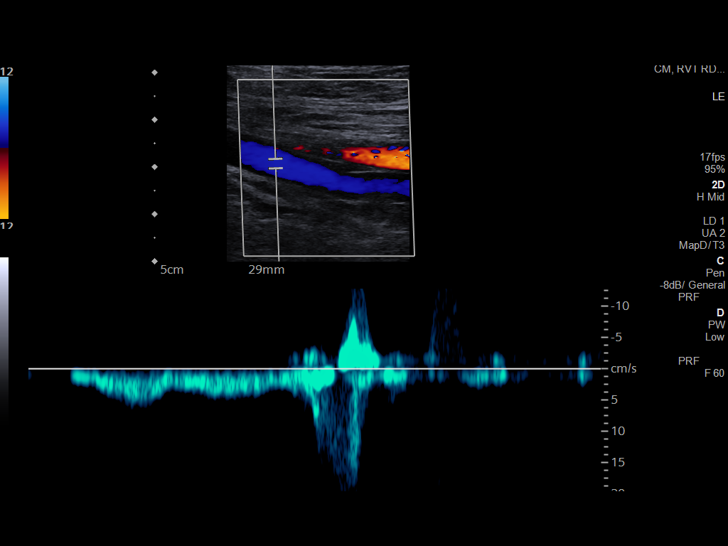
[im 27/52]
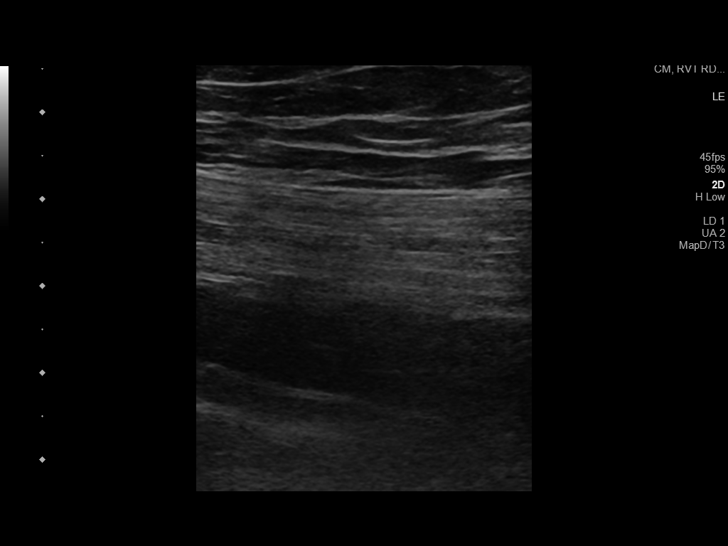
[im 29/52]
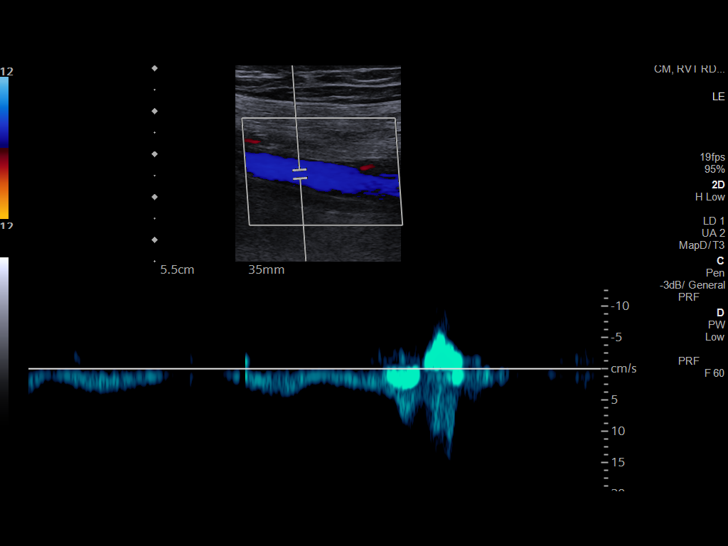
[im 34/52]
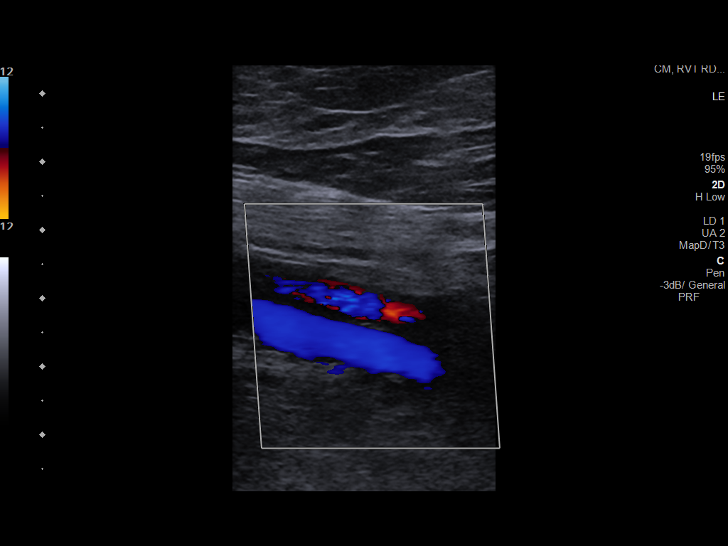
[im 38/52]
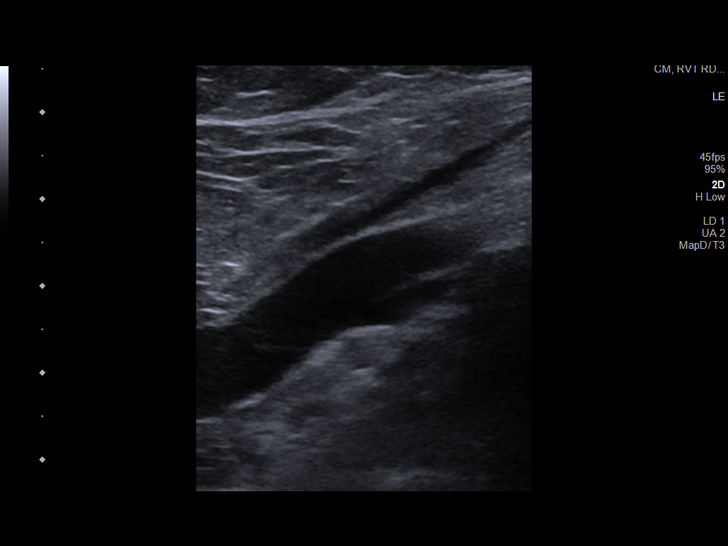
[im 43/52]
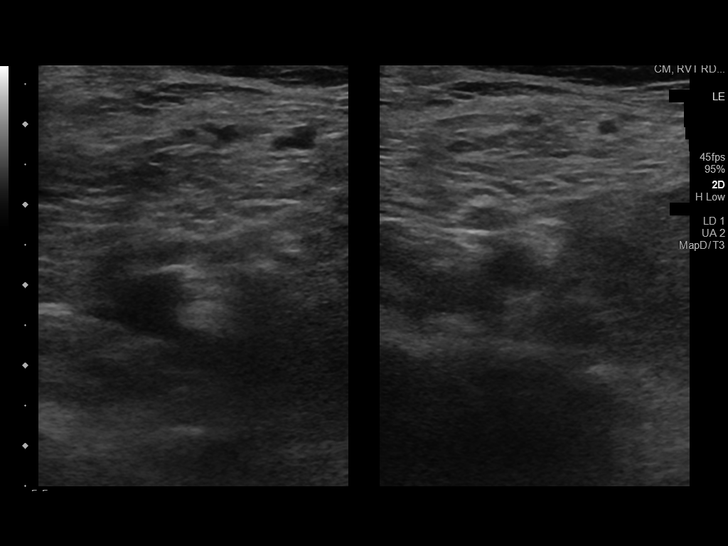
[im 47/52]
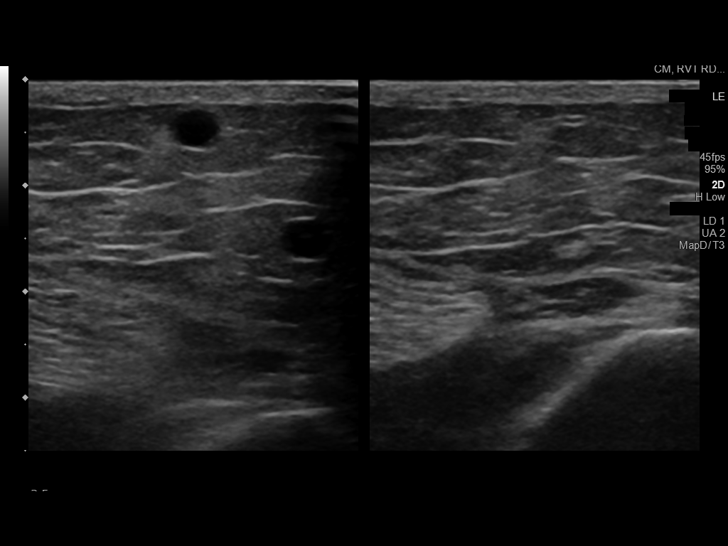
[im 52/52]
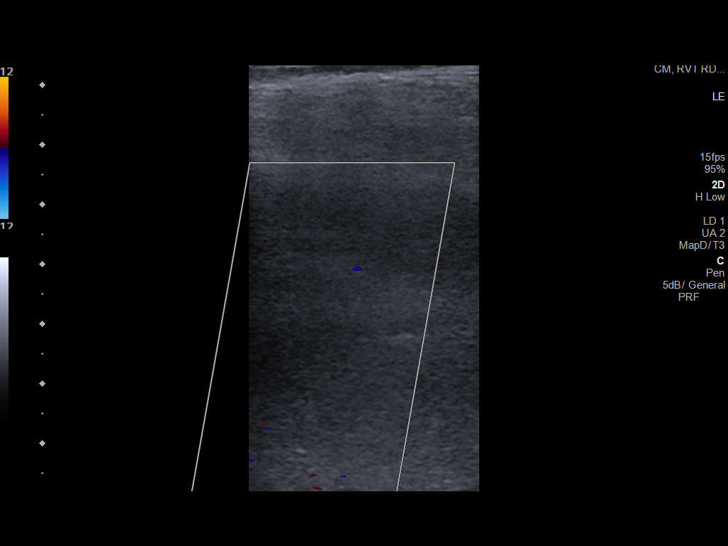

[13 of 24 positions shown; findings below may reference images not displayed]

FINDINGS: Contralateral Common Femoral Vein: Respiratory phasicity is normal
and symmetric with the symptomatic side. No evidence of thrombus.
Normal compressibility.

Common Femoral Vein: No evidence of thrombus. Normal
compressibility, respiratory phasicity and response to augmentation.

Saphenofemoral Junction: No evidence of thrombus. Normal
compressibility and flow on color Doppler imaging.

Profunda Femoral Vein: No evidence of thrombus. Normal
compressibility and flow on color Doppler imaging.

Femoral Vein: No evidence of thrombus. Normal compressibility,
respiratory phasicity and response to augmentation.

Popliteal Vein: No evidence of thrombus. Normal compressibility,
respiratory phasicity and response to augmentation.

Calf Veins: Limited assessment.  No large occlusive thrombus.

Superficial Great Saphenous Vein: No evidence of thrombus. Normal
compressibility.

Venous Reflux:  Not assessed

Other Findings:  None.
IMPRESSION: No significant left lower extremity DVT.

## 2019-01-16 ENCOUNTER — Other Ambulatory Visit: Payer: Self-pay

## 2019-01-16 ENCOUNTER — Encounter: Payer: Self-pay | Admitting: Orthopedic Surgery

## 2019-01-16 ENCOUNTER — Ambulatory Visit (INDEPENDENT_AMBULATORY_CARE_PROVIDER_SITE_OTHER): Payer: Medicare HMO | Admitting: Orthopedic Surgery

## 2019-01-16 VITALS — BP 163/82 | HR 76 | Ht 64.0 in | Wt 244.0 lb

## 2019-01-16 DIAGNOSIS — M51369 Other intervertebral disc degeneration, lumbar region without mention of lumbar back pain or lower extremity pain: Secondary | ICD-10-CM

## 2019-01-16 DIAGNOSIS — Z6841 Body Mass Index (BMI) 40.0 and over, adult: Secondary | ICD-10-CM | POA: Diagnosis not present

## 2019-01-16 DIAGNOSIS — M5410 Radiculopathy, site unspecified: Secondary | ICD-10-CM

## 2019-01-16 DIAGNOSIS — M1611 Unilateral primary osteoarthritis, right hip: Secondary | ICD-10-CM

## 2019-01-16 DIAGNOSIS — M5136 Other intervertebral disc degeneration, lumbar region: Secondary | ICD-10-CM | POA: Diagnosis not present

## 2019-01-16 DIAGNOSIS — F17200 Nicotine dependence, unspecified, uncomplicated: Secondary | ICD-10-CM

## 2019-01-16 NOTE — Progress Notes (Signed)
NEW PROBLEM OFFICE VISIT  Chief Complaint  Patient presents with  . Groin Pain    right     68 year old female presents with a one-year history of pain in her lower back and right hip but she notices increasing pain recently over the last 4 to 6 weeks with increasing groin pain and trouble walking.  She also has lower back pain which radiates down the back of her right leg into her right foot and she has new onset swelling in her left leg and ankle but a DVT study on May 28 was negative  She does have a remote history history of breast cancer she is on tamoxifen  She is on ibuprofen 400 mg every 8 hours with no relief.  She complains of severe pain no numbness but radiating pain down the right leg.  Her activities of daily living are becoming more difficult and she does live alone   Review of Systems  Constitutional: Positive for diaphoresis and malaise/fatigue.  HENT: Positive for congestion.   Eyes: Positive for blurred vision.  Respiratory: Positive for cough, sputum production, shortness of breath and wheezing.   Cardiovascular: Positive for palpitations, leg swelling and PND.  Gastrointestinal: Positive for abdominal pain, blood in stool, constipation and heartburn.  Genitourinary: Positive for frequency.  Musculoskeletal: Positive for back pain, joint pain and neck pain.  Neurological: Positive for dizziness, tremors and weakness.  Endo/Heme/Allergies: Positive for environmental allergies. Bruises/bleeds easily.  Psychiatric/Behavioral: Positive for depression and memory loss. The patient has insomnia.   All other systems reviewed and are negative.    Past Medical History:  Diagnosis Date  . Asthma   . Asymptomatic varicose veins   . Breast cancer ([place]) 06/2014   left  . Chronic airway obstruction ([place])   . COPD (chronic obstructive pulmonary disease) ([place])   . Depression   . Hemorrhage rectum    and anus.  . Hypertension   . Insomnia   . Other symptoms involving  digestive system(787.99)   . Pain    Hx.pain in joint involving pelvic region and thigh; pain in limb  . Palpitations   . Panic attacks   . Personal history of chemotherapy 2016  . Personal history of radiation therapy 2016  . Sinusitis   . Symptomatic menopausal or female climacteric states     Past Surgical History:  Procedure Laterality Date  . BREAST BIOPSY Left 10/2012  . BREAST BIOPSY Left 07/01/2014   malignant  . BREAST LUMPECTOMY Left 08/20/2014  . CESAREAN SECTION    . CHOLECYSTECTOMY  1985  . COLONOSCOPY  2002   Dr. Lindalou Hose: internal hemorrhoids, one small rectal polyp, adenomatous path   . COLONOSCOPY N/A 11/23/2015   Procedure: COLONOSCOPY;  Surgeon: Danie Binder, MD;  [facility];  Service: Endoscopy;  Laterality: N/A;  1100 - moved to 10:45 - office to notify  . ESOPHAGOGASTRODUODENOSCOPY N/A 11/23/2015   Procedure: ESOPHAGOGASTRODUODENOSCOPY (EGD);  Surgeon: Danie Binder, MD;  [facility];  Service: Endoscopy;  Laterality: N/A;  . PORT-A-CATH REMOVAL  02/2015  . PORTACATH PLACEMENT Right 09/17/2014   Procedure: INSERTION PORT-A-CATH WITH ULTRASOUND;  Surgeon: Erroll Luna, MD;  Location: [place];  Service: General;  Laterality: Right;  IJ  . RADIOACTIVE SEED GUIDED PARTIAL MASTECTOMY WITH AXILLARY SENTINEL LYMPH NODE BIOPSY Left 08/20/2014   Procedure: LEFT BREAST LUMPECTOMY WITH RADIOACTIVE SEED LOCALIZATION AND SENTINEL LYMPH NODE MAPPING;  Surgeon: Erroll Luna, MD;  Location: [facility];  Service: General;  Laterality: Left;  . RE-EXCISION OF BREAST LUMPECTOMY Left 09/17/2014   Procedure: RE-EXCISION OF BREAST LUMPECTOMY;  Surgeon: Erroll Luna, MD;  Location: [facility];  Service: General;  Laterality: Left;  . TUBAL LIGATION      Family History  Problem Relation Age of Onset  . Diabetes Mother   . Ovarian cancer Mother 28  . Other Father        died in MVA at age 43  . Other  Sister        mitral valve disorder  . Melanoma Sister 54       removed from back of leg  . Colon cancer Brother        early 23s, succumbed to disease  . Cancer Paternal Aunt        leukemia, bone, or lung cancer  . Alzheimer's disease Maternal Grandmother   . Heart attack Maternal Grandfather   . Heart attack Paternal Grandmother   . COPD Paternal Grandfather   . Emphysema Paternal Grandfather   . Congestive Heart Failure Paternal Aunt   . Stomach cancer Paternal Uncle   . Kidney cancer Maternal Uncle   . Cancer Cousin        dx. teens/dx. 40s  . Cancer Cousin   . Colon cancer Cousin    Social History   Tobacco Use  . Smoking status: Current Every Day Smoker    Packs/day: 1.00    Types: Cigarettes    Last attempt to quit: 02/03/2016    Years since quitting: 2.9  . Smokeless tobacco: Never Used  Substance Use Topics  . Alcohol use: No    Alcohol/week: 0.0 standard drinks  . Drug use: No    No Known Allergies  Current Meds  Medication Sig  . albuterol (PROVENTIL HFA;VENTOLIN HFA) 108 (90 BASE) MCG/ACT inhaler Inhale 2 puffs into the lungs every 6 (six) hours as needed for wheezing or shortness of breath.  . ALPRAZolam (XANAX) 0.5 MG tablet Take 0.5 mg by mouth 3 (three) times daily as needed for anxiety.  . calcium carbonate (TUMS - DOSED IN MG ELEMENTAL CALCIUM) 500 MG chewable tablet Chew 1 tablet by mouth 3 (three) times daily with meals. Reported on 12/07/2015  . hydrochlorothiazide (MICROZIDE) 12.5 MG capsule Take 12.5 mg by mouth daily. Reported on 12/07/2015  . ibuprofen (ADVIL,MOTRIN) 200 MG tablet Take 400 mg by mouth every 8 (eight) hours.  . pantoprazole (PROTONIX) 40 MG tablet TAKE 1 TABLET BY MOUTH ONCE DAILY  . sertraline (ZOLOFT) 50 MG tablet Take 50 mg by mouth daily.  . tamoxifen (NOLVADEX) 20 MG tablet Take 1 tablet (20 mg total) by mouth daily.    BP (!) 163/82   Pulse 76   Ht 5\' 4"  (1.626 m)   Wt 244 lb (110.7 kg)   BMI 41.88 kg/m   Physical  Exam Vitals signs and nursing note reviewed.  Constitutional:      Appearance: Normal appearance.  Neurological:     Mental Status: She is alert and oriented to person, place, and time.  Psychiatric:        Mood and Affect: Mood normal.     Ortho Exam   Her right and left upper extremity show no malalignment normal range motion no joint subluxation dislocation atrophy tremor good strength normal skin pulse and perfusion normal lymph nodes negative and sensation intact  Patient's overall coordination and balance are poor gait is poor  Left hip no tenderness to palpation  she has full range of motion her hip is stable strength is normal around the hip joint and the skin is excellent except for the distal portion which shows some erythema and redness around the ankle and foot with swelling and edema of the left leg again worked up by ultrasound sensory exam is normal deep tendon reflexes are 0 at the knee and ankle and a straight leg raise test seated is normal  Right hip she has decreased abduction and flexion and painful internal rotation at 90 degrees of flexion in the right hip she has a positive straight leg raise in the seated position she has no tenderness to palpation in the groin stability tests are normal skin is intact pulses are good temperature is normal no edema sensation is normal reflexes are 0 at the knee and 1+ at the ankle Lumbar spine is tender in the middle of the back left side of the back right side of the back thoracic and cervical spine nontender  MEDICAL DECISION SECTION  Xrays were done at X-rays are done at [facility]  My independent reading of xrays:  Lumbar spine shows L5-S1 arthritis  Hip x-ray shows arthritis of the right hip none on the left hip  Reports from the images are listed below and have been reviewed  Encounter Diagnoses  Name Primary?  . Primary osteoarthritis of right hip Yes  . DDD (degenerative disc disease), lumbar   . Radicular  pain of right lower extremity   . Body mass index 40.0-44.9, adult ([place])   . Morbid obesity ([place])   . Smoking addiction   The patient meets the AMA guidelines for Morbid (severe) obesity with a BMI > 40.0 and I have recommended weight loss.   PLAN: (Rx., injectx, surgery, frx, mri/ct) She is really in bad shape here.  She was advised to stop smoking and lose weight.  She was sent for  physical therapy on her back which I think is not an operative situation at this point may be epidurals might be needed.   She needs a hip replacement.  She needs to lose weight and stop smoking before she can have that  she was started on a walker and compression hose. Follow-up in 3 months   No orders of the defined types were placed in this encounter.  CLINICAL DATA:  Left leg pain, breast cancer, varicose veins   EXAM: LEFT LOWER EXTREMITY VENOUS DOPPLER ULTRASOUND   TECHNIQUE: Gray-scale sonography with graded compression, as well as color Doppler and duplex ultrasound were performed to evaluate the lower extremity deep venous systems from the level of the common femoral vein and including the common femoral, femoral, profunda femoral, popliteal and calf veins including the posterior tibial, peroneal and gastrocnemius veins when visible. The superficial great saphenous vein was also interrogated. Spectral Doppler was utilized to evaluate flow at rest and with distal augmentation maneuvers in the common femoral, femoral and popliteal veins.   COMPARISON:  None.   FINDINGS: Contralateral Common Femoral Vein: Respiratory phasicity is normal and symmetric with the symptomatic side. No evidence of thrombus. Normal compressibility.   Common Femoral Vein: No evidence of thrombus. Normal compressibility, respiratory phasicity and response to augmentation.   Saphenofemoral Junction: No evidence of thrombus. Normal compressibility and flow on color Doppler imaging.   Profunda Femoral Vein: No  evidence of thrombus. Normal compressibility and flow on color Doppler imaging.   Femoral Vein: No evidence of thrombus. Normal compressibility, respiratory phasicity and response to augmentation.  Popliteal Vein: No evidence of thrombus. Normal compressibility, respiratory phasicity and response to augmentation.   Calf Veins: Limited assessment.  No large occlusive thrombus.   Superficial Great Saphenous Vein: No evidence of thrombus. Normal compressibility.   Venous Reflux:  Not assessed   Other Findings:  None.   IMPRESSION: No significant left lower extremity DVT.     Electronically Signed   By: Jerilynn Mages.  Shick M.D.   On: 12/20/2018 14:25   CLINICAL DATA:  Low back pain with hip pain. History of breast cancer   EXAM: DG HIP (WITH OR WITHOUT PELVIS) 5+V BILAT   COMPARISON:  None.   FINDINGS: Moderate to severe degenerative change in the right hip joint with joint space narrowing and spurring.   Left hip joint normal.   Negative for fracture or mass.   IMPRESSION: Moderate to advanced degenerative change right hip     Electronically Signed   By: Franchot Gallo M.D.   On: 12/20/2018 13:57   CLINICAL DATA:  Chronic low back pain radiating into the right hip and leg.   EXAM: LUMBAR SPINE - COMPLETE 4+ VIEW   COMPARISON:  None.   FINDINGS: Five lumbar type vertebral bodies. No acute fracture or subluxation. Vertebral body heights are preserved. Facet mediated 5 mm anterolisthesis at L4-5. Moderate disc height loss at L4-L5. Lower thoracic degenerative disc disease. The sacroiliac joints are unremarkable. Moderate right hip osteoarthritis. Aortic atherosclerosis.   IMPRESSION: 1. Moderate degenerative disc disease and grade 1 anterolisthesis at L4-L5.     Electronically Signed   By: Titus Dubin M.D.   On: 12/20/2018 13:56  Arther Abbott, MD  01/16/2019 4:59 PM

## 2019-02-07 ENCOUNTER — Telehealth: Payer: Self-pay

## 2019-02-07 DIAGNOSIS — M1611 Unilateral primary osteoarthritis, right hip: Secondary | ICD-10-CM

## 2019-02-07 NOTE — Telephone Encounter (Signed)
Regina Mcmahon, will you call her and tell her to try [facility] If they do not file, she will have to call her insurance to see where she can go. I am in [place].   To Dr Aline Brochure , okay for her to have [facility] placard?

## 2019-02-07 NOTE — Telephone Encounter (Signed)
Patient called saying that she went to [place] [facility] for the hose and walker rxs and was told that they do not take her [employer]. She is asking where can she go to get these things that take her insurance. Stated she would like to have a cane instead of the walker if possible. She is also asking if she could get a handicap placard for her car.  Please call and advise

## 2019-02-08 NOTE — Telephone Encounter (Signed)
Left a message for patient to call the office so this information can be relayed to her.

## 2019-02-13 NOTE — Telephone Encounter (Signed)
ok 

## 2019-02-13 NOTE — Telephone Encounter (Signed)
Called her to come pick up

## 2019-02-13 NOTE — Telephone Encounter (Signed)
Will you approve a handicapped placard for her?

## 2019-02-13 NOTE — Telephone Encounter (Signed)
Patient called back and has more questions about the prescriptions for walker and for the support hose - as noted, [employer] cannot fill under her [employer] Also said she may want a cane instead of a walker. Asking about the driver's form for handicap sticker.

## 2019-04-17 ENCOUNTER — Encounter: Payer: Self-pay | Admitting: Orthopedic Surgery

## 2019-04-17 ENCOUNTER — Other Ambulatory Visit: Payer: Self-pay

## 2019-04-17 ENCOUNTER — Ambulatory Visit: Payer: Medicare HMO | Admitting: Orthopedic Surgery

## 2019-04-17 VITALS — BP 138/81 | HR 78 | Temp 92.3°F | Ht 64.0 in | Wt 238.2 lb

## 2019-04-17 DIAGNOSIS — M5410 Radiculopathy, site unspecified: Secondary | ICD-10-CM

## 2019-04-17 DIAGNOSIS — M1611 Unilateral primary osteoarthritis, right hip: Secondary | ICD-10-CM

## 2019-04-17 MED ORDER — TRAMADOL-ACETAMINOPHEN 37.5-325 MG PO TABS: 1.0000 | ORAL_TABLET | ORAL | 5 refills | Status: DC | PRN

## 2019-04-17 NOTE — Progress Notes (Signed)
Regina Mcmahon  04/17/2019  HISTORY SECTION :  Chief Complaint  Patient presents with  . Back Pain    still having pain  . Hip Pain    right hip, still having pain and trouble walking    68 year old female previously evaluated for right hip pain found to have osteoarthritis in the lumbar spine was sent for physical therapy.  She still has pretty severe right buttock pain radiating into the right lower leg along with her groin pain.  She has x-ray evidence of degenerative disc disease and osteoarthritis right hip however she is a 2 pack/day smoker and has not made any inroads into stopping and she has COPD.  She did lose weight and got her BMI more reasonable  She had physical therapy she is on ibuprofen she has not improved  She has 8 out of 10 pain even with the ibuprofen worse in the morning  She has used a cane now for several months to help her walk   Review of Systems  Constitutional: Positive for diaphoresis and malaise/fatigue.  HENT: Positive for congestion.   Eyes: Positive for blurred vision.  Respiratory: Positive for cough, sputum production, shortness of breath and wheezing.   Cardiovascular: Positive for palpitations, leg swelling and PND.  Gastrointestinal: Positive for abdominal pain, blood in stool, constipation and heartburn.  Genitourinary: Positive for frequency.  Musculoskeletal: Positive for back pain, joint pain and neck pain.  Neurological: Positive for dizziness, tremors and weakness.  Endo/Heme/Allergies: Positive for environmental allergies. Bruises/bleeds easily.  Psychiatric/Behavioral: Positive for depression and memory loss. The patient has insomnia.   All other systems reviewed and are negative.    has a past medical history of Asthma, Asymptomatic varicose veins, Breast cancer ([place]) (06/2014), Chronic airway obstruction ([facility]), COPD (chronic obstructive pulmonary disease) ([place]), Depression, Hemorrhage rectum, Hypertension, Insomnia, Other  symptoms involving digestive system(787.99), Pain, Palpitations, Panic attacks, Personal history of chemotherapy (2016), Personal history of radiation therapy (2016), Sinusitis, and Symptomatic menopausal or female climacteric states.   Past Surgical History:  Procedure Laterality Date  . BREAST BIOPSY Left 10/2012  . BREAST BIOPSY Left 07/01/2014   malignant  . BREAST LUMPECTOMY Left 08/20/2014  . CESAREAN SECTION    . CHOLECYSTECTOMY  1985  . COLONOSCOPY  2002   Dr. Lindalou Hose: internal hemorrhoids, one small rectal polyp, adenomatous path   . COLONOSCOPY N/A 11/23/2015   Procedure: COLONOSCOPY;  Surgeon: Danie Binder, MD;  [facility];  Service: Endoscopy;  Laterality: N/A;  1100 - moved to 10:45 - office to notify  . ESOPHAGOGASTRODUODENOSCOPY N/A 11/23/2015   Procedure: ESOPHAGOGASTRODUODENOSCOPY (EGD);  Surgeon: Danie Binder, MD;  [facility];  Service: Endoscopy;  Laterality: N/A;  . PORT-A-CATH REMOVAL  02/2015  . PORTACATH PLACEMENT Right 09/17/2014   Procedure: INSERTION PORT-A-CATH WITH ULTRASOUND;  Surgeon: Erroll Luna, MD;  Location: [place];  Service: General;  Laterality: Right;  IJ  . RADIOACTIVE SEED GUIDED PARTIAL MASTECTOMY WITH AXILLARY SENTINEL LYMPH NODE BIOPSY Left 08/20/2014   Procedure: LEFT BREAST LUMPECTOMY WITH RADIOACTIVE SEED LOCALIZATION AND SENTINEL LYMPH NODE MAPPING;  Surgeon: Erroll Luna, MD;  Location: [place];  Service: General;  Laterality: Left;  . RE-EXCISION OF BREAST LUMPECTOMY Left 09/17/2014   Procedure: RE-EXCISION OF BREAST LUMPECTOMY;  Surgeon: Erroll Luna, MD;  Location: [place];  Service: General;  Laterality: Left;  . TUBAL LIGATION      Body mass index is 40.89 kg/m.   No  Known Allergies   Current Outpatient Medications:  .  albuterol (PROVENTIL HFA;VENTOLIN HFA) 108 (90 BASE) MCG/ACT inhaler, Inhale 2 puffs into the lungs every 6 (six) hours as needed  for wheezing or shortness of breath., Disp: , Rfl:  .  ALPRAZolam (XANAX) 0.5 MG tablet, Take 0.5 mg by mouth 3 (three) times daily as needed for anxiety., Disp: , Rfl:  .  calcium carbonate (TUMS - DOSED IN MG ELEMENTAL CALCIUM) 500 MG chewable tablet, Chew 1 tablet by mouth 3 (three) times daily with meals. Reported on 12/07/2015, Disp: , Rfl:  .  hydrochlorothiazide (MICROZIDE) 12.5 MG capsule, Take 12.5 mg by mouth daily. Reported on 12/07/2015, Disp: , Rfl:  .  ibuprofen (ADVIL) 800 MG tablet, Take 800 mg by mouth 3 (three) times daily., Disp: , Rfl:  .  pantoprazole (PROTONIX) 40 MG tablet, TAKE 1 TABLET BY MOUTH ONCE DAILY, Disp: 90 tablet, Rfl: 3 .  sertraline (ZOLOFT) 50 MG tablet, Take 50 mg by mouth daily., Disp: , Rfl:  .  tamoxifen (NOLVADEX) 20 MG tablet, Take 1 tablet (20 mg total) by mouth daily., Disp: 30 tablet, Rfl: 3 .  traMADol-acetaminophen (ULTRACET) 37.5-325 MG tablet, Take 1 tablet by mouth every 4 (four) hours as needed., Disp: 90 tablet, Rfl: 5   PHYSICAL EXAM SECTION: 1) BP 138/81   Pulse 78   Temp (!) 92.3 F (33.5 C)   Ht 5\' 4"  (1.626 m)   Wt 238 lb 3.2 oz (108 kg)   BMI 40.89 kg/m   Body mass index is 40.89 kg/m. General appearance: Well-developed well-nourished no gross deformities  2) Cardiovascular normal pulse and perfusion in the lower extremities normal color without edema  3) Neurologically deep tendon reflexes are equal and normal, no sensation loss or deficits no pathologic reflexes  4) Psychological: Awake alert and oriented x3 mood and affect normal  5) Skin no lacerations or ulcerations no nodularity no palpable masses, no erythema or nodularity  6) Musculoskeletal:  Left hip range of motion is normal no pain strength is normal.  Right hip pain at 90 degrees of flexion pain increases with internal rotation decreased hip flexion and abduction are noted  Decreased internal rotation  She also has pain in the lumbar spine with a slightly  positive straight leg raise at 40 degrees on the right negative on the left   MEDICAL DECISION SECTION:  Encounter Diagnoses  Name Primary?  . Primary osteoarthritis of right hip Yes  . Radicular pain of right lower extremity     Imaging Prior imaging moderate to advanced degenerative arthritis right hip  Moderate degenerative disc disease grade 1 anterolisthesis L4 on 5   Plan:  (Rx., Inj., surg., Frx, MRI/CT, XR:2)  Ms. [place] is still smoking 2 packs/day she has COPD her weight is come down to reasonable amount for replacement but until the smoking stops we cannot do the surgery which I would recommend Dr. Ninfa Linden do  So we set her up for epidurals after MRI of her back  Hip injection of the joint  Start Ultracet  Meds ordered this encounter  Medications  . traMADol-acetaminophen (ULTRACET) 37.5-325 MG tablet    Sig: Take 1 tablet by mouth every 4 (four) hours as needed.    Dispense:  90 tablet    Refill:  5     10:38 AM Arther Abbott, MD  04/17/2019

## 2019-04-17 NOTE — Patient Instructions (Signed)
1. MRI back, I will call you with the results and set up the EPIDURALS after  That  2. Start pain medication   3. Hip injection will be set up

## 2019-05-09 ENCOUNTER — Encounter: Payer: Self-pay | Admitting: Physical Medicine and Rehabilitation

## 2019-05-09 ENCOUNTER — Ambulatory Visit (INDEPENDENT_AMBULATORY_CARE_PROVIDER_SITE_OTHER): Payer: Medicare HMO | Admitting: Physical Medicine and Rehabilitation

## 2019-05-09 ENCOUNTER — Other Ambulatory Visit: Payer: Self-pay

## 2019-05-09 ENCOUNTER — Ambulatory Visit: Payer: Self-pay

## 2019-05-09 DIAGNOSIS — M25551 Pain in right hip: Secondary | ICD-10-CM

## 2019-05-09 NOTE — Progress Notes (Signed)
 .  Numeric Pain Rating Scale and Functional Assessment Average Pain 7   In the last MONTH (on 0-10 scale) has pain interfered with the following?  1. General activity like being  able to carry out your everyday physical activities such as walking, climbing stairs, carrying groceries, or moving a chair?  Rating(7)  -Dye Allergies.

## 2019-05-09 NOTE — Progress Notes (Signed)
   Regina Mcmahon - 68 y.o. female MRN [id number]  Date of birth: 05/01/1951  Office Visit Note: Visit Date: 05/09/2019 PCP: Sinda Du, MD Referred by: Sinda Du, MD  Subjective: Chief Complaint  Patient presents with  . Right Hip - Pain  . Right Thigh - Pain   HPI:  Regina Mcmahon is a 68 y.o. female who comes in today At the request of Dr. Arther Abbott for diagnostic and hopefully therapeutic anesthetic hip arthrogram on the right.  Patient's been having chronic right hip and groin pain for sometimes.  She reports moving her leg, getting in and out of car and walking seem to increase her pain.  She rates her pain as a 7 out of 10.  ROS Otherwise per HPI.  Assessment & Plan: Visit Diagnoses: No diagnosis found.  Plan: Findings:  Patient did have relief of symptoms partially during the anesthetic phase of the injection.    Meds & Orders: No orders of the defined types were placed in this encounter.  No orders of the defined types were placed in this encounter.   Follow-up: No follow-ups on file.   Procedures: Large Joint Inj: R hip joint on 05/09/2019 1:40 PM Indications: pain and diagnostic evaluation Details: 22 G needle, anterior approach  Arthrogram: Yes  Medications: 4 mL bupivacaine 0.25 %; 60 mg triamcinolone acetonide 40 MG/ML Outcome: tolerated well, no immediate complications  Arthrogram demonstrated excellent flow of contrast throughout the joint surface without extravasation or obvious defect.  The patient had relief of symptoms during the anesthetic phase of the injection.  Procedure, treatment alternatives, risks and benefits explained, specific risks discussed. Consent was given by the patient. Immediately prior to procedure a time out was called to verify the correct patient, procedure, equipment, support staff and site/side marked as required. Patient was prepped and draped in the usual sterile fashion.      No notes on file   Clinical  History: No specialty comments available.     Objective:  VS:  HT:    WT:   BMI:     BP:   HR: bpm  TEMP: ( )  RESP:  Physical Exam  Ortho Exam Imaging: No results found.

## 2019-05-10 MED ORDER — BUPIVACAINE HCL 0.25 % IJ SOLN
4.0000 mL | INTRAMUSCULAR | Status: AC | PRN
  Administered 2019-05-09: 4 mL via INTRA_ARTICULAR

## 2019-05-10 MED ORDER — TRIAMCINOLONE ACETONIDE 40 MG/ML IJ SUSP
60.0000 mg | INTRAMUSCULAR | Status: AC | PRN
  Administered 2019-05-09: 60 mg via INTRA_ARTICULAR

## 2019-05-20 ENCOUNTER — Other Ambulatory Visit: Payer: Medicare HMO

## 2019-05-21 ENCOUNTER — Telehealth: Payer: Self-pay | Admitting: Radiology

## 2019-05-21 NOTE — Telephone Encounter (Signed)
I called patient, I still have open order for MRI, patient cancelled study. She states pain has increased since her hip injection. Dr Kennon Portela office is moving, and their office has not returned her call.   Is there anything you can advise with her increased hip pain following the injection of her hip?

## 2019-05-21 NOTE — Telephone Encounter (Signed)
Plan:  (Rx., Inj., surg., Frx, MRI/CT, XR:2)   Regina Mcmahon is still smoking 2 packs/day she has COPD her weight is come down to reasonable amount for replacement but until the smoking stops we cannot do the surgery which I would recommend Dr. Ninfa Linden do   So we set her up for epidurals after MRI of her back   Hip injection of the joint   Start Ultracet       Meds ordered this encounter  Medications  . traMADol-acetaminophen (ULTRACET) 37.5-325 MG tablet      Sig: Take 1 tablet by mouth every 4 (four) hours as needed.      Dispense:  90 tablet      Refill:  5   Is she taking the ultracet ?

## 2019-05-21 NOTE — Telephone Encounter (Signed)
I have advised to use ice on her hip and to use the Ultracet, she states it causes constipation / I told her to use it with Miralax.

## 2019-05-22 ENCOUNTER — Telehealth: Payer: Self-pay | Admitting: *Deleted

## 2019-05-23 NOTE — Telephone Encounter (Signed)
She should follow-up with Dr. Aline Brochure and see what he recommends at this point.  We typically do not repeat hip injection that soon but would like to see what Dr. Aline Brochure decides

## 2019-05-23 NOTE — Telephone Encounter (Signed)
Called pt and she states she did see Dr. Aline Brochure and he gave her pain medication. Pt understood per Dr. Ernestina Patches.

## 2019-05-28 ENCOUNTER — Other Ambulatory Visit (HOSPITAL_COMMUNITY): Payer: Self-pay | Admitting: Nurse Practitioner

## 2019-05-28 DIAGNOSIS — C50412 Malignant neoplasm of upper-outer quadrant of left female breast: Secondary | ICD-10-CM

## 2019-05-28 DIAGNOSIS — Z170 Estrogen receptor positive status [ER+]: Secondary | ICD-10-CM

## 2019-06-04 ENCOUNTER — Other Ambulatory Visit (HOSPITAL_COMMUNITY): Payer: Self-pay | Admitting: Nurse Practitioner

## 2019-06-04 DIAGNOSIS — Z170 Estrogen receptor positive status [ER+]: Secondary | ICD-10-CM

## 2019-06-04 DIAGNOSIS — C50412 Malignant neoplasm of upper-outer quadrant of left female breast: Secondary | ICD-10-CM

## 2019-06-11 ENCOUNTER — Ambulatory Visit (HOSPITAL_COMMUNITY): Admission: RE | Admit: 2019-06-11 | Payer: Medicare HMO | Source: Ambulatory Visit

## 2019-06-11 ENCOUNTER — Ambulatory Visit (HOSPITAL_COMMUNITY)
Admission: RE | Admit: 2019-06-11 | Discharge: 2019-06-11 | Disposition: A | Discharge status: Home / Self Care | Payer: Medicare HMO | Source: Ambulatory Visit | Attending: Nurse Practitioner | Admitting: Nurse Practitioner

## 2019-06-11 ENCOUNTER — Ambulatory Visit (HOSPITAL_COMMUNITY): Payer: Medicare HMO

## 2019-06-11 ENCOUNTER — Other Ambulatory Visit: Payer: Self-pay

## 2019-06-11 DIAGNOSIS — C50412 Malignant neoplasm of upper-outer quadrant of left female breast: Secondary | ICD-10-CM | POA: Insufficient documentation

## 2019-06-11 DIAGNOSIS — Z170 Estrogen receptor positive status [ER+]: Secondary | ICD-10-CM | POA: Insufficient documentation

## 2019-06-11 IMAGING — MG DIGITAL DIAGNOSTIC BILAT W/ TOMO W/ CAD
8 of 12 series · 8 of 32 positions shown · non-contrast
Comparison: Previous exam(s).

CLINICAL DATA: History of a left lumpectomy for breast carcinoma
performed in [DATE].Current exam is routine annual
surveillance.

EXAM:
DIGITAL DIAGNOSTIC BILATERAL MAMMOGRAM WITH CAD AND TOMO

[L CC]
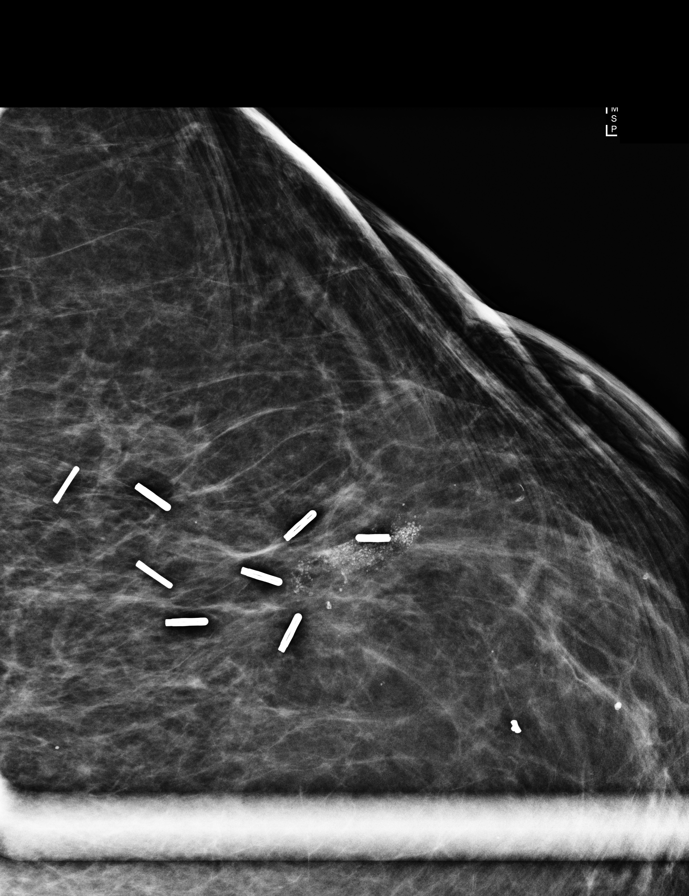

[L ML]
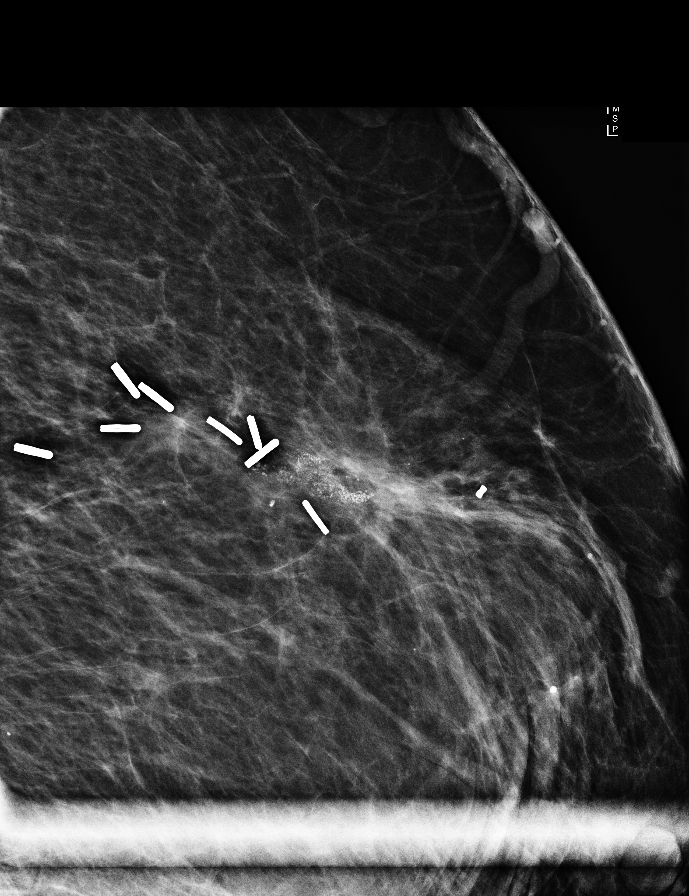

[L MLO synth-2D (1 of 2)]
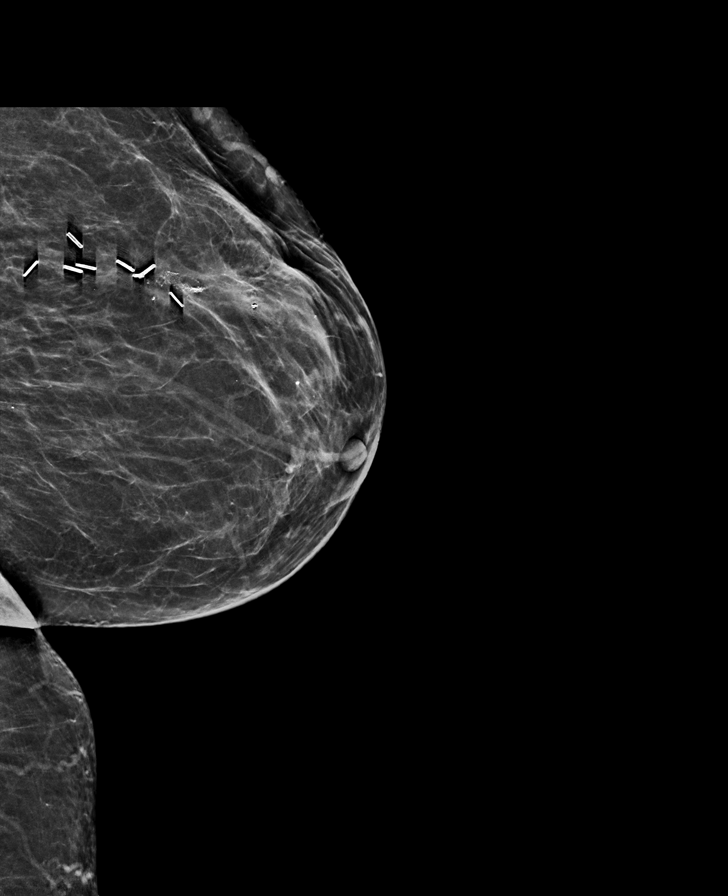

[R CC synth-2D]
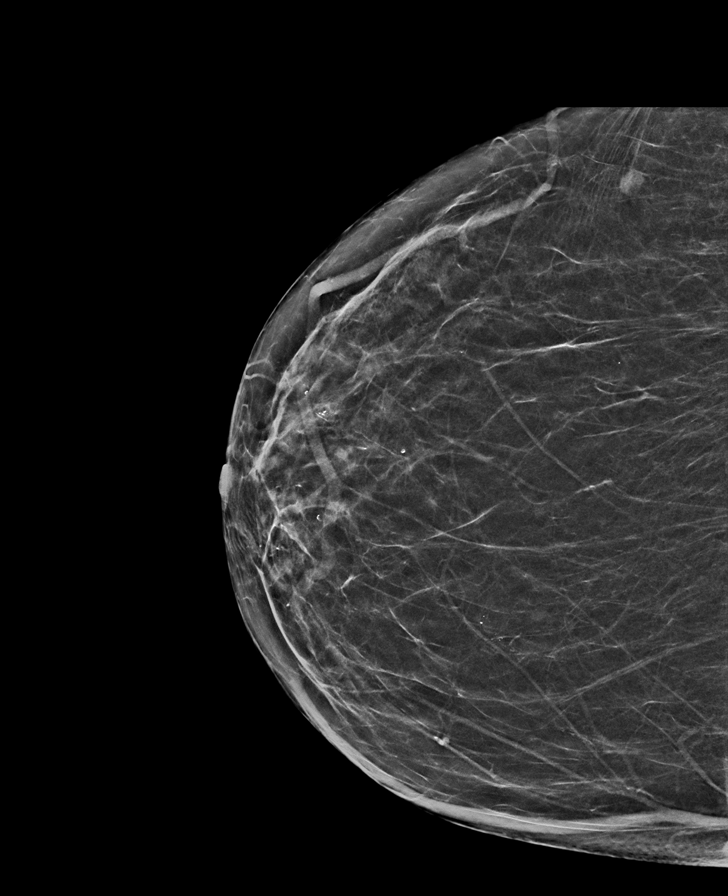

[R MLO synth-2D]
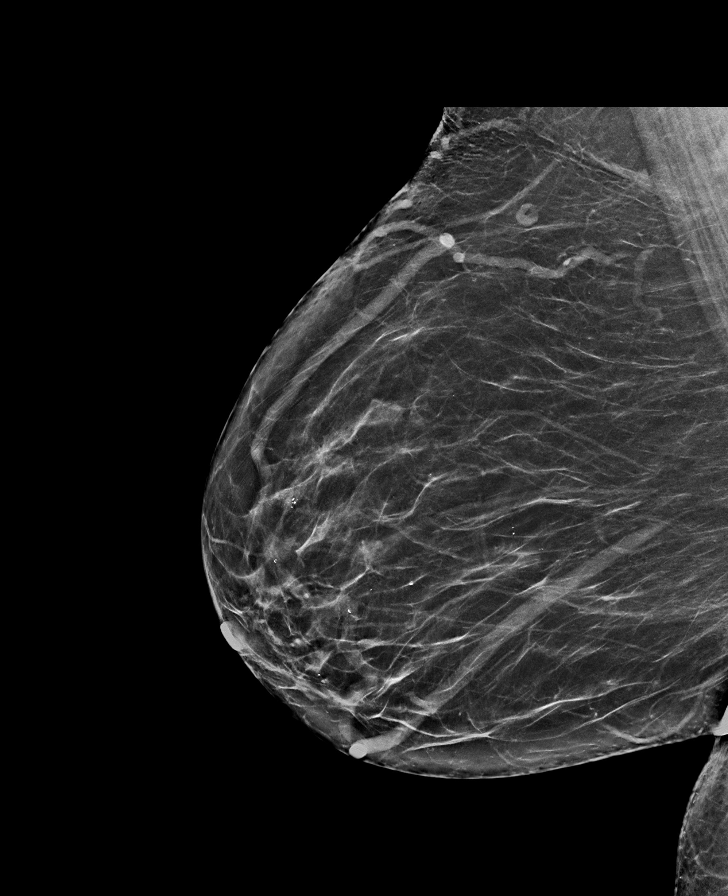

[L CC synth-2D]
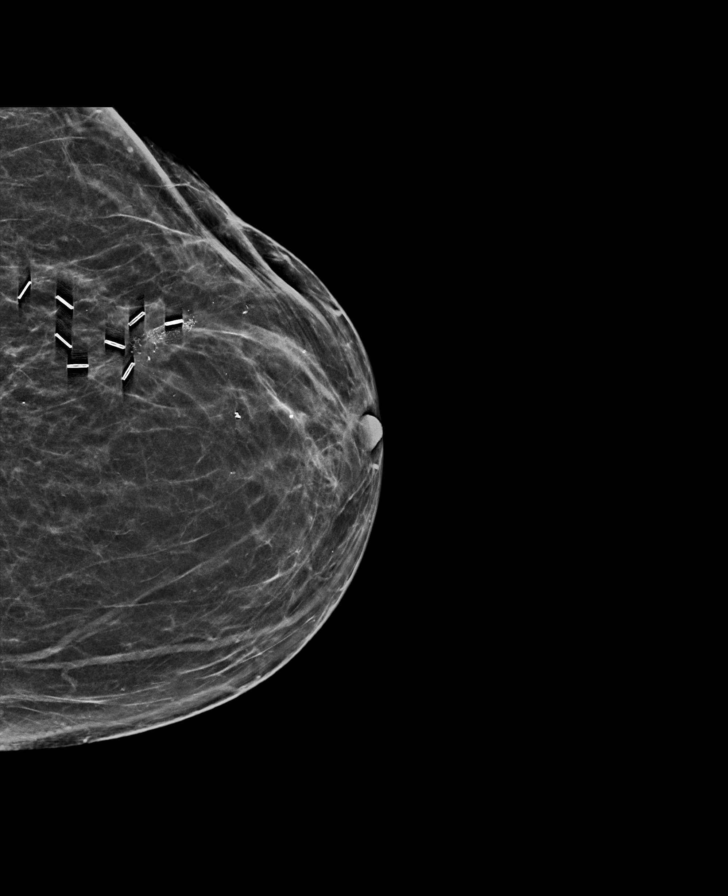

[L MLO synth-2D (2 of 2)]
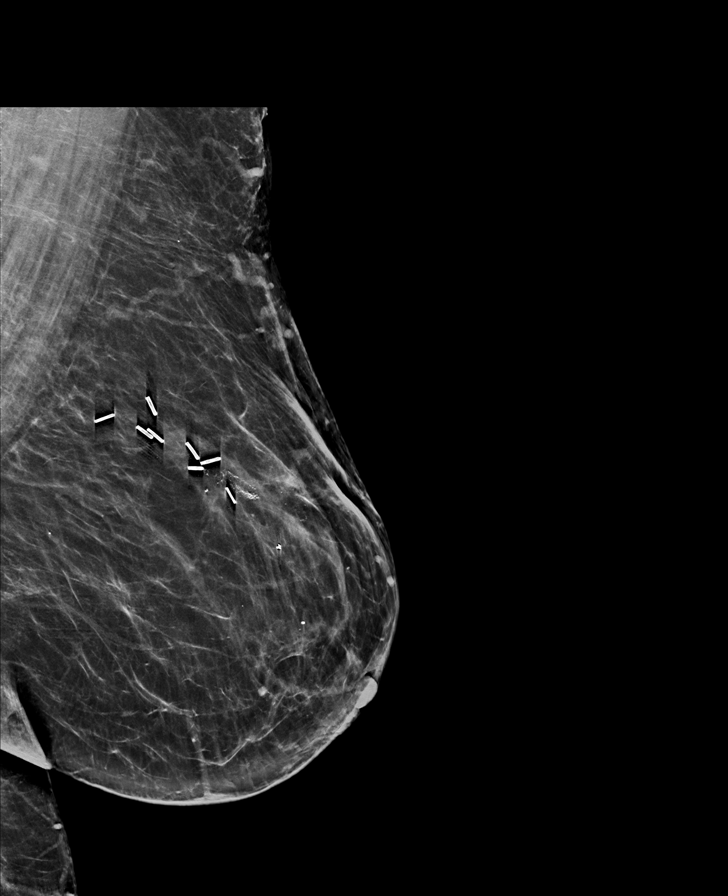

[L MLO tomo · tomo slice 37/73.0]
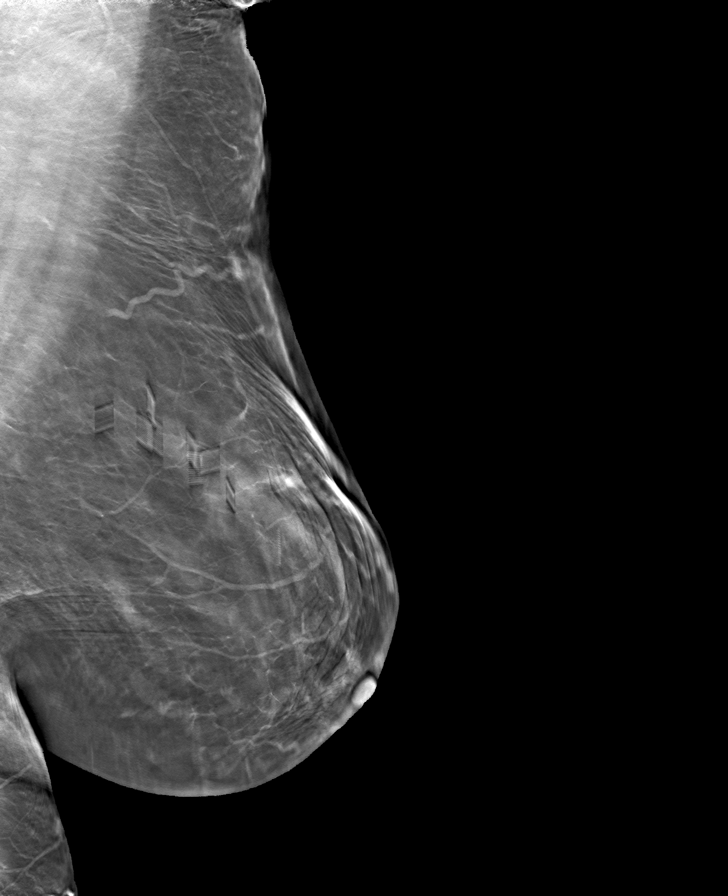

[8 of 32 positions shown; findings below may reference images not displayed]

ACR Breast Density Category b: There are scattered areas of
fibroglandular density.
FINDINGS: In the lumpectomy bed, a group of amorphous calcifications have
developed, spanning 2.3 x 0.6 x 0.6 cm. There is no associated mass.
There is no change in the postsurgical architectural distortion.

Elsewhere, there no suspicious masses, no other areas of new
calcification and no areas of nonsurgical architectural distortion.

Mammographic images were processed with CAD.
IMPRESSION: 1. New calcifications in the left breast lumpectomy bed. Although
likely due to fat necrosis, given that these are new, tissue
sampling is recommended.

RECOMMENDATION:
Stereotactic core needle biopsy of the calcifications in the left
breast lumpectomy bed.

I have discussed the findings and recommendations with the patient.
If applicable, a reminder letter will be sent to the patient
regarding the next appointment.

BI-RADS CATEGORY  4: Suspicious.

## 2019-06-12 ENCOUNTER — Other Ambulatory Visit: Payer: Self-pay | Admitting: *Deleted

## 2019-06-12 ENCOUNTER — Other Ambulatory Visit (HOSPITAL_COMMUNITY): Payer: Self-pay | Admitting: *Deleted

## 2019-06-12 ENCOUNTER — Ambulatory Visit: Payer: Medicare HMO | Admitting: Orthopedic Surgery

## 2019-06-12 ENCOUNTER — Other Ambulatory Visit (HOSPITAL_COMMUNITY): Payer: Self-pay | Admitting: Hematology

## 2019-06-12 DIAGNOSIS — Z170 Estrogen receptor positive status [ER+]: Secondary | ICD-10-CM

## 2019-06-12 DIAGNOSIS — C50412 Malignant neoplasm of upper-outer quadrant of left female breast: Secondary | ICD-10-CM

## 2019-06-12 NOTE — Progress Notes (Signed)
Stereotactic core needle biopsy of the calcifications in the left breast lumpectomy bed recommended after mammogram today.  Orders placed.  Patient will be notified of appointment.

## 2019-06-17 ENCOUNTER — Telehealth: Payer: Self-pay | Admitting: Orthopedic Surgery

## 2019-06-17 ENCOUNTER — Other Ambulatory Visit: Payer: Self-pay | Admitting: Orthopedic Surgery

## 2019-06-17 DIAGNOSIS — M5410 Radiculopathy, site unspecified: Secondary | ICD-10-CM

## 2019-06-17 DIAGNOSIS — M1611 Unilateral primary osteoarthritis, right hip: Secondary | ICD-10-CM

## 2019-06-17 MED ORDER — TRAMADOL-ACETAMINOPHEN 37.5-325 MG PO TABS: 1.0000 | ORAL_TABLET | ORAL | 5 refills | Status: DC | PRN

## 2019-06-17 NOTE — Telephone Encounter (Signed)
Patient called to inquire about refill for medication  traMADol-acetaminophen (ULTRACET) 37.5-325 MG tablet 90 tablet  -[place]  - patient was unable to attend her scheduled appointment on 06/12/19 due to other medical issues and appointments. Please advise regarding refill and/or possible virtual visit?

## 2019-06-17 NOTE — Progress Notes (Signed)
Meds ordered this encounter  Medications  . traMADol-acetaminophen (ULTRACET) 37.5-325 MG tablet    Sig: Take 1 tablet by mouth every 4 (four) hours as needed.    Dispense:  90 tablet    Refill:  5

## 2019-06-19 ENCOUNTER — Ambulatory Visit
Admission: RE | Admit: 2019-06-19 | Discharge: 2019-06-19 | Disposition: A | Discharge status: Home / Self Care | Payer: Medicare HMO | Source: Ambulatory Visit | Attending: Hematology | Admitting: Hematology

## 2019-06-19 ENCOUNTER — Other Ambulatory Visit: Payer: Self-pay

## 2019-06-19 DIAGNOSIS — C50412 Malignant neoplasm of upper-outer quadrant of left female breast: Secondary | ICD-10-CM

## 2019-06-19 DIAGNOSIS — Z170 Estrogen receptor positive status [ER+]: Secondary | ICD-10-CM

## 2019-06-19 IMAGING — MG MM BREAST LOCALIZATION CLIP
4 series · 4 of 12 positions shown · non-contrast
Comparison: Previous exam(s).
COMPARISON: Previous exam(s).

Addendum:
CLINICAL DATA: Post biopsy mammogram of the left breast for clip
placement.

EXAM:
DIAGNOSTIC LEFT MAMMOGRAM POST STEREOTACTIC BIOPSY

[L ML synth-2D]
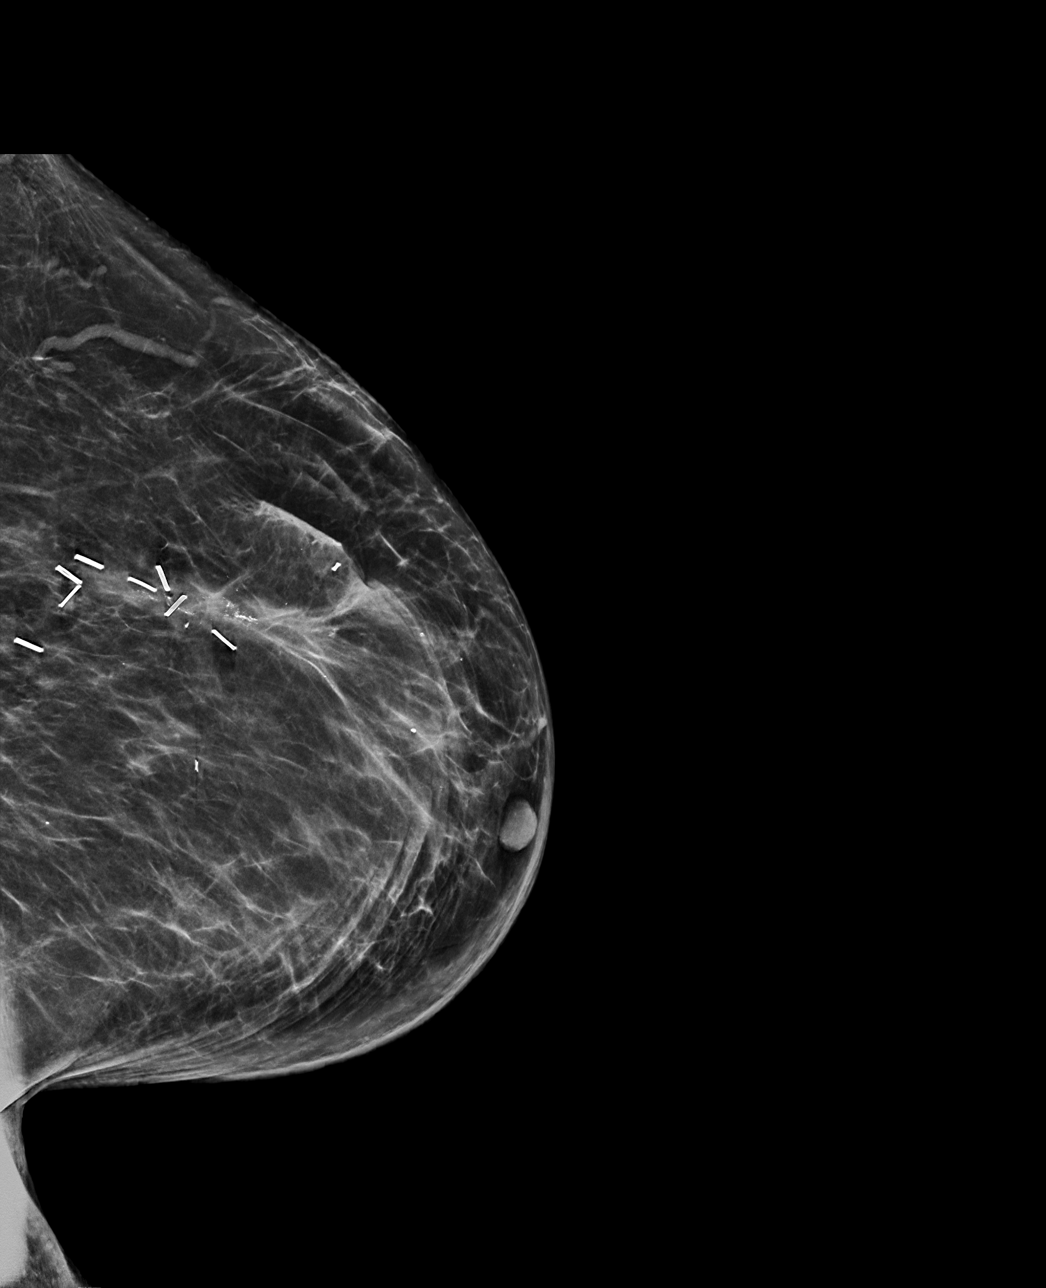

[L CC synth-2D]
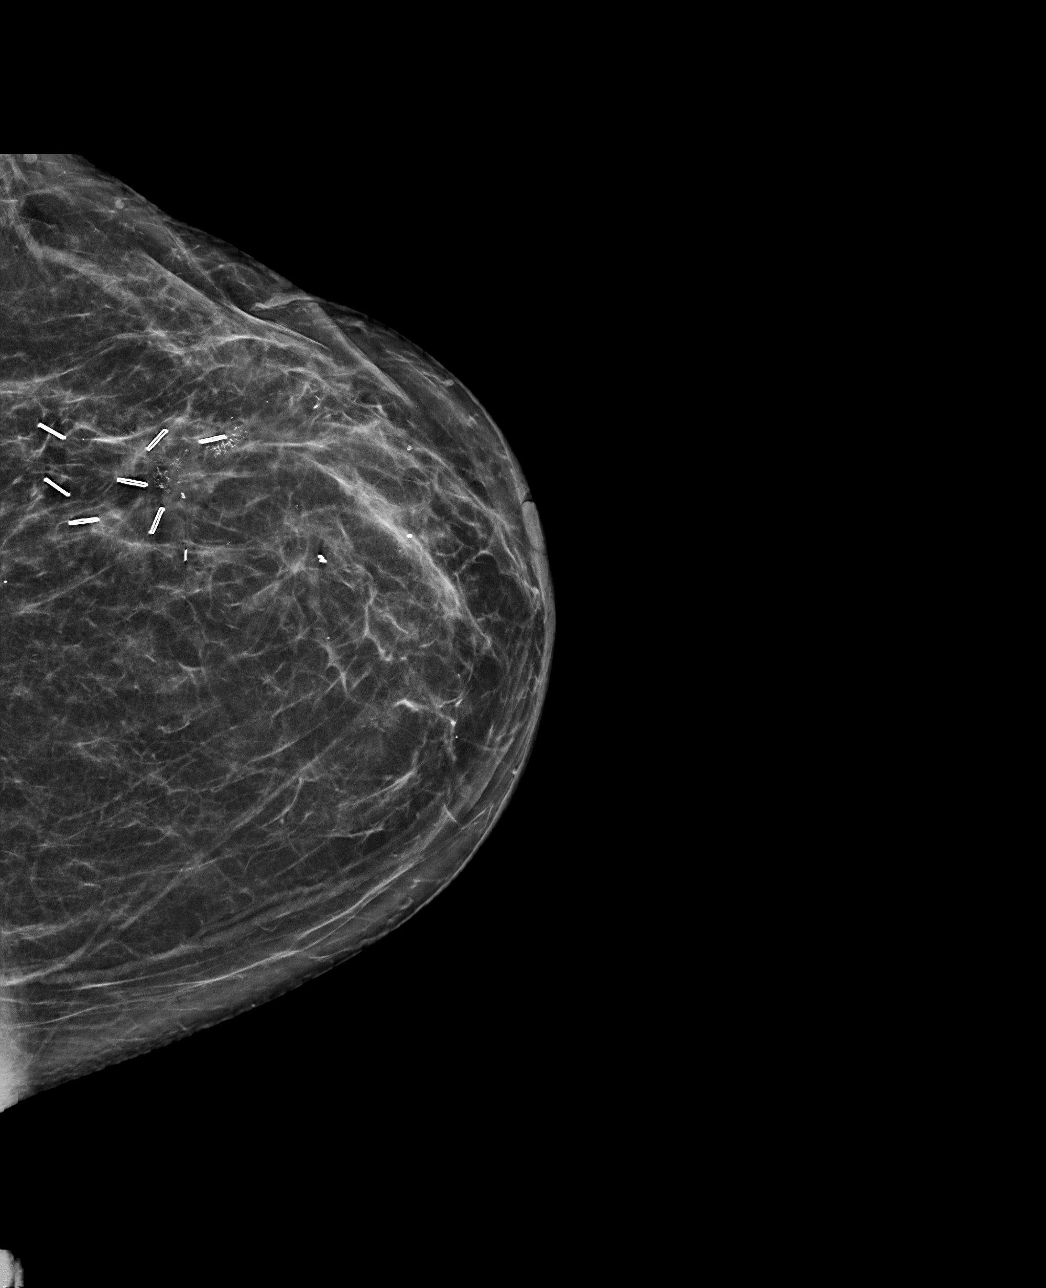

[L ML tomo · tomo slice 33/65.0]
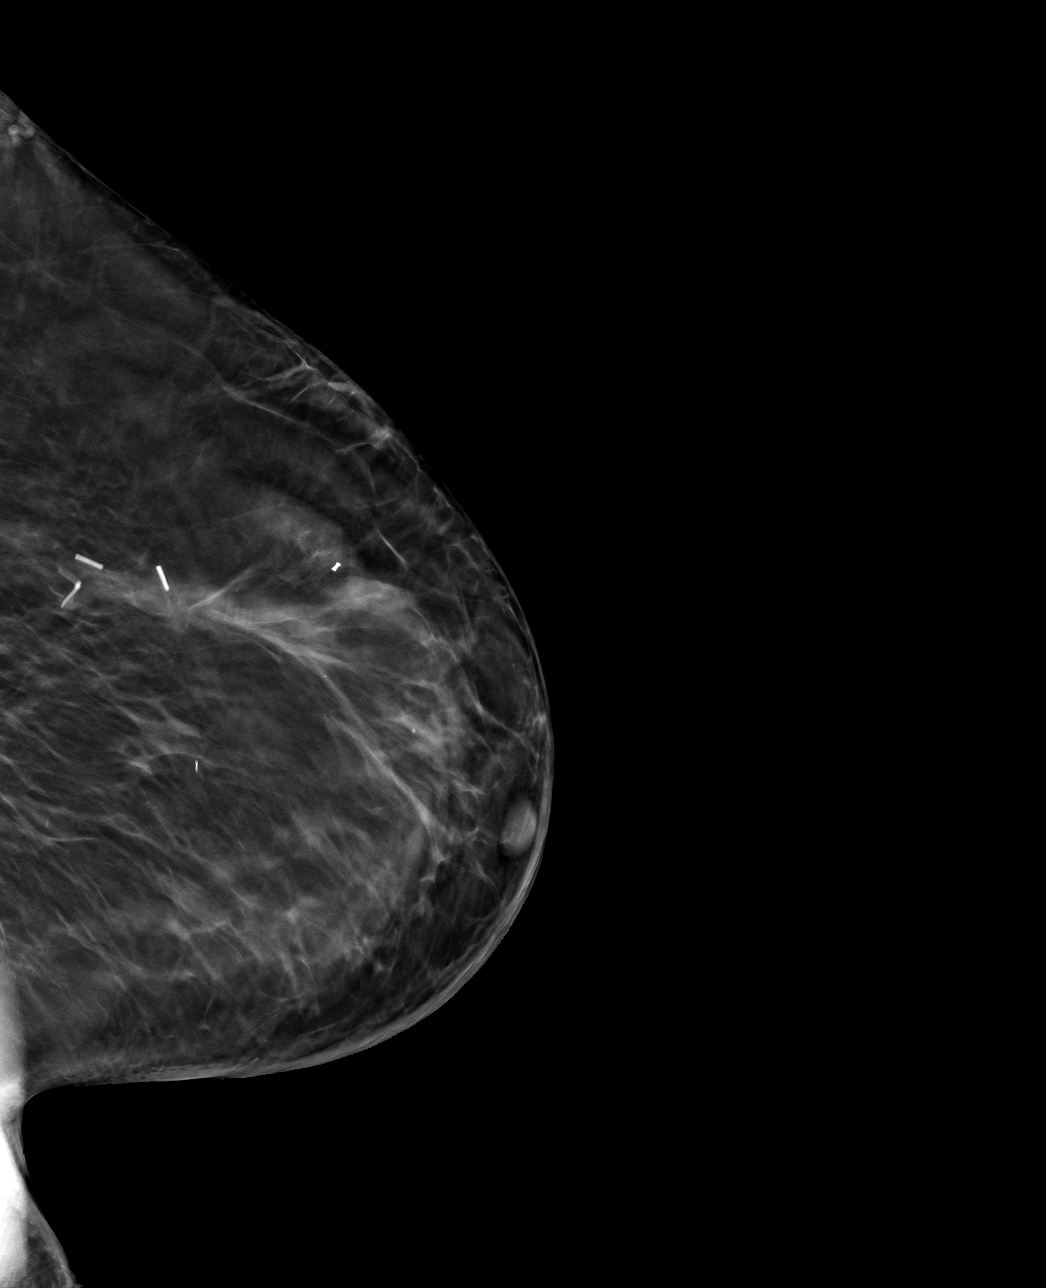

[L CC tomo · tomo slice 33/65.0]
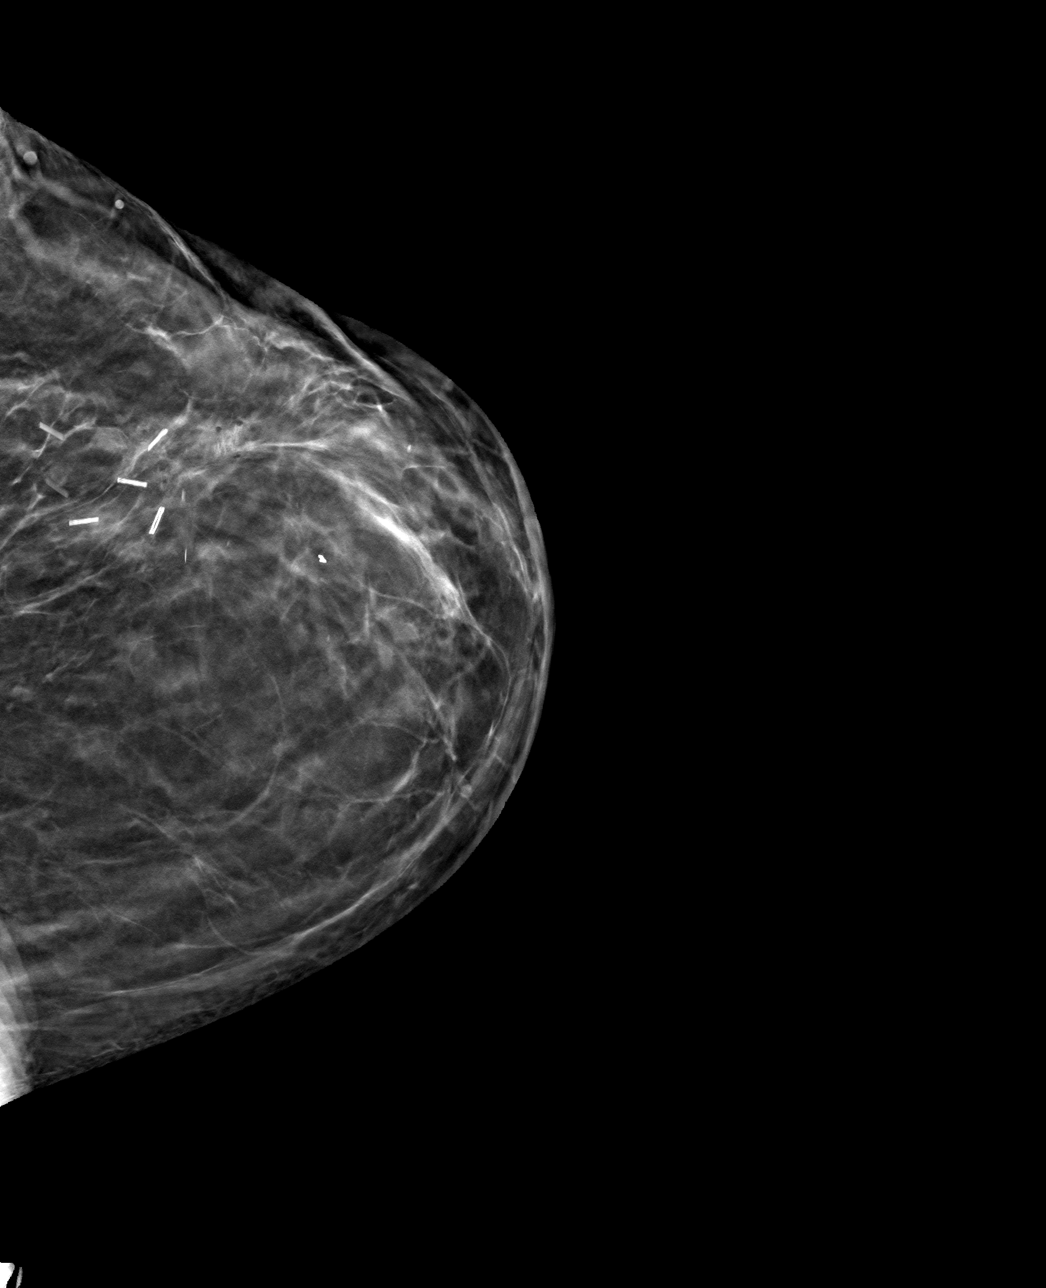

[4 of 12 positions shown; findings below may reference images not displayed]

FINDINGS: Mammographic images were obtained following stereotactic guided
biopsy of calcifications in the upper-outer left breast. The X
shaped biopsy marking clip is 3 cm inferiorly displaced from the
site of biopsy.
IMPRESSION: Appropriate positioning of the X shaped biopsy marking clip at the
site of biopsy in the upper-outer left breast.

Final Assessment: Post Procedure Mammograms for Marker Placement

ADDENDUM:
Pathology revealed SCAR WITH NECROSIS AND DYSTROPHIC CALCIFICATIONS
of the Left breast, upper outer. This was found to be concordant by
Dr. CIANCI.

Pathology results were discussed with the patient by telephone. The
patient reported doing well after the biopsy with tenderness at the
site. Post biopsy instructions and care were reviewed and questions
were answered. The patient was encouraged to call The [REDACTED]

The patient was instructed to return for annual diagnostic
mammography at [HOSPITAL] in [HOSPITAL][HOSPITAL].

Pathology results reported by CIANCI, RN on [DATE].

*** End of Addendum ***
FINDINGS: Mammographic images were obtained following stereotactic guided
biopsy of calcifications in the upper-outer left breast. The X
shaped biopsy marking clip is 3 cm inferiorly displaced from the
site of biopsy.
IMPRESSION: Appropriate positioning of the X shaped biopsy marking clip at the
site of biopsy in the upper-outer left breast.

Final Assessment: Post Procedure Mammograms for Marker Placement

## 2019-06-19 IMAGING — MG MM BREAST BX W LOC DEV 1ST LESION IMAGE BX SPEC STEREO GUIDE*L*
8 of 11 series · 8 of 23 positions shown · non-contrast
Comparison: Previous exams.

CLINICAL DATA: 68-year-old female presenting for stereotactic
biopsy of left breast calcifications.

EXAM:
LEFT BREAST STEREOTACTIC CORE NEEDLE BIOPSY

[L (1 of 5)]
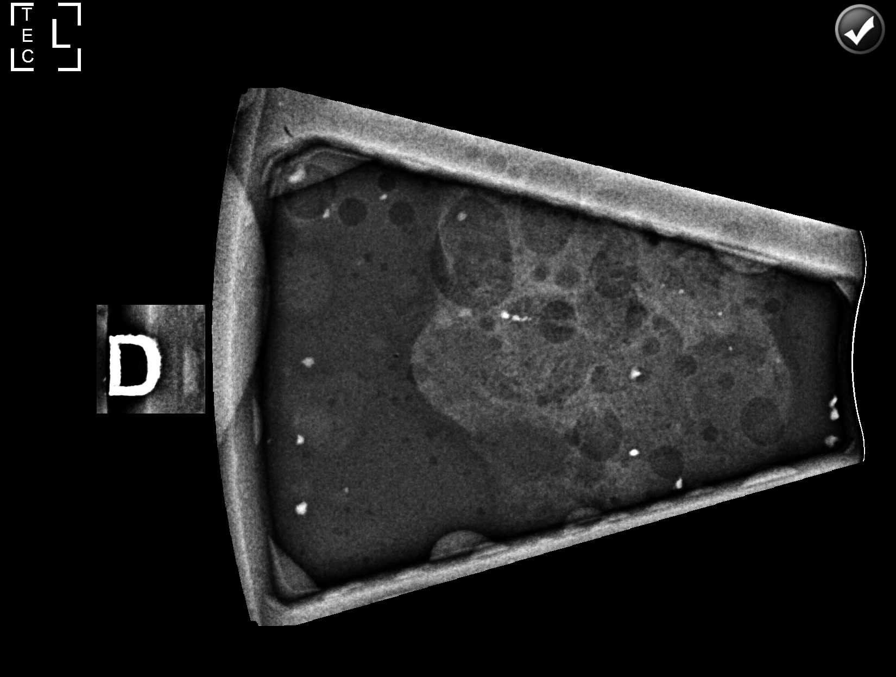

[L (2 of 5)]
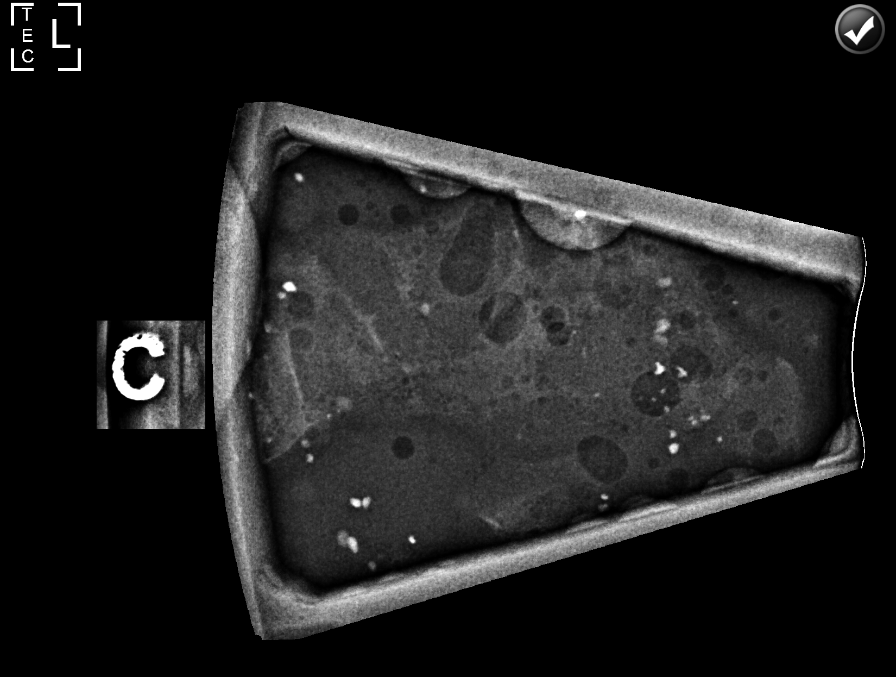

[L (3 of 5)]
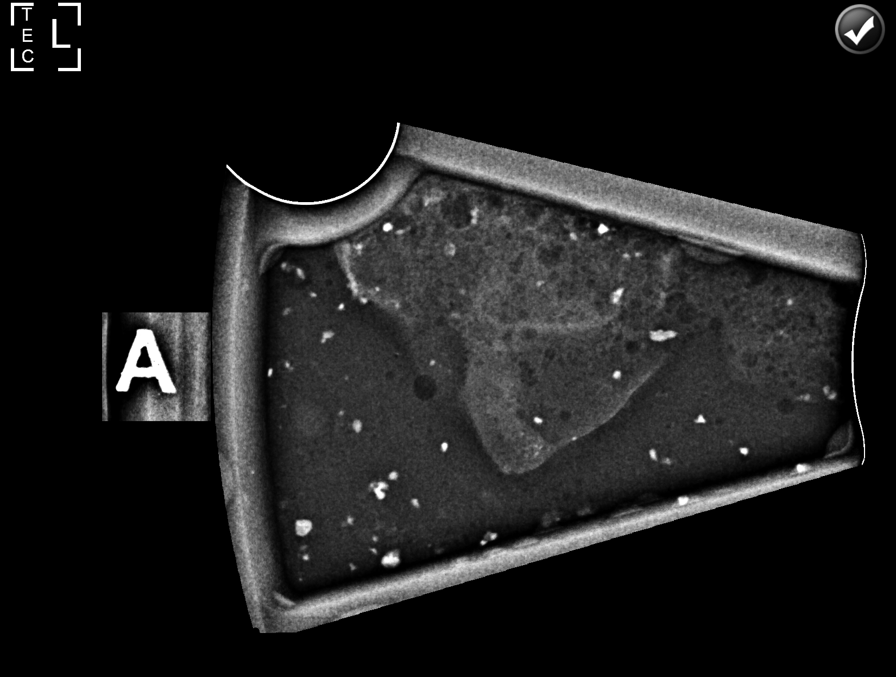

[L (4 of 5)]
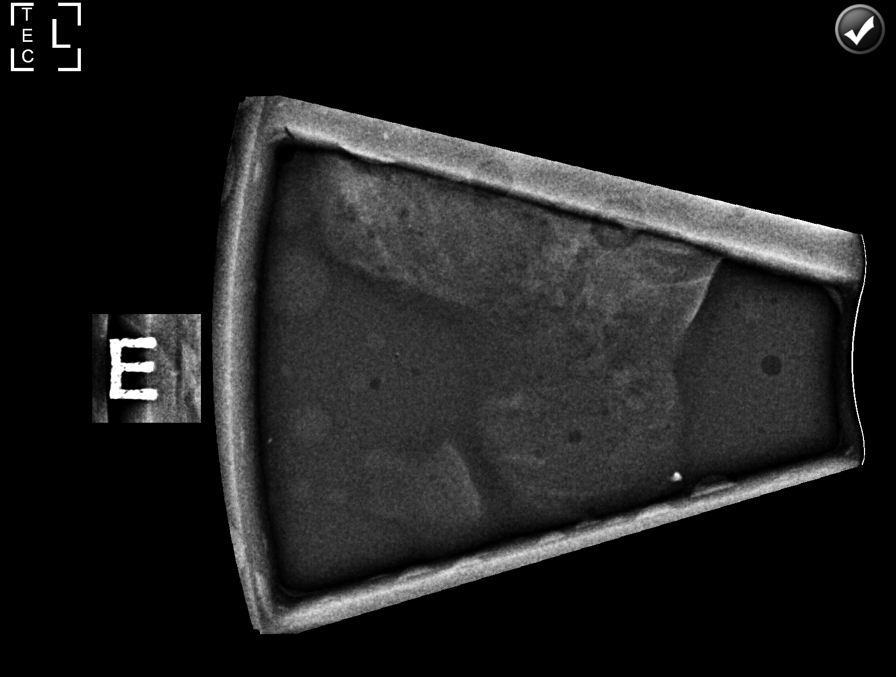

[L (5 of 5)]
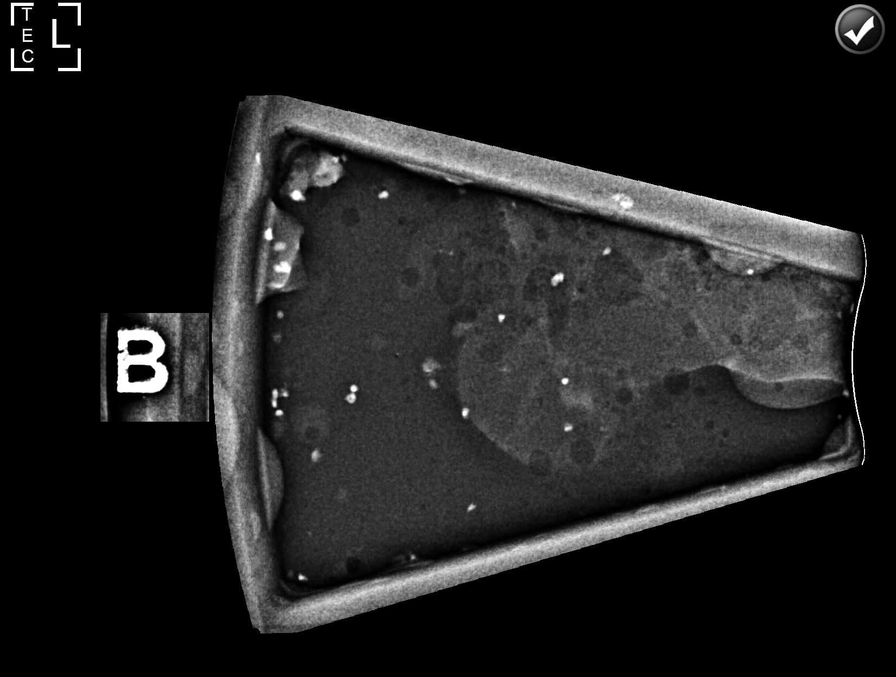

[L CC (1 of 3)]
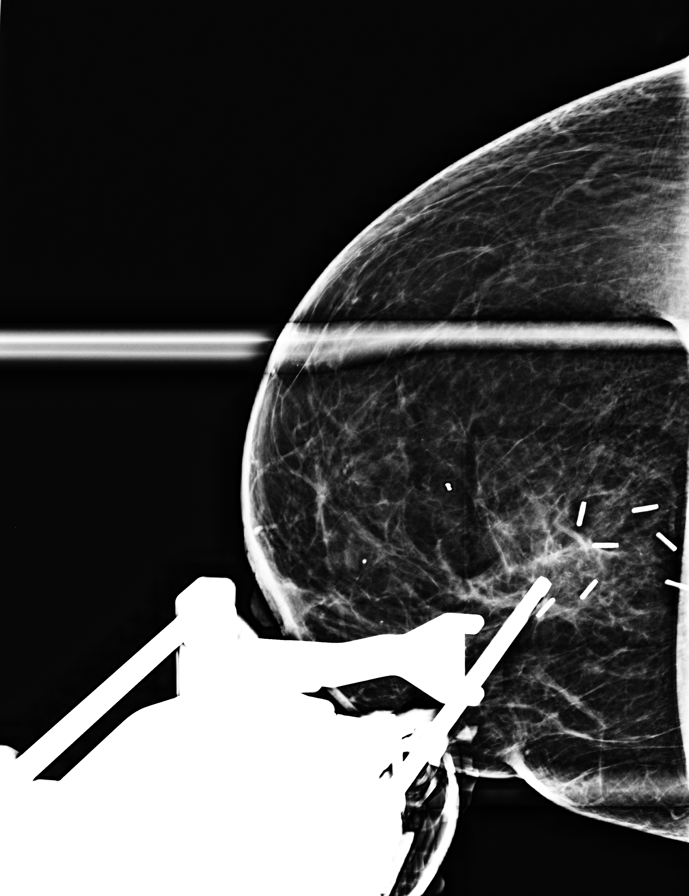

[L CC (2 of 3)]
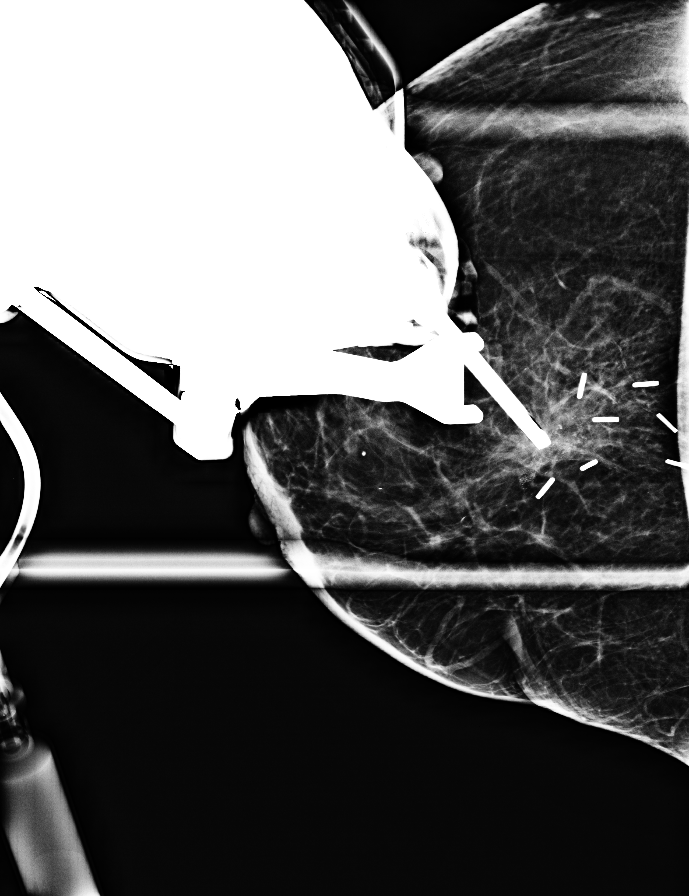

[L CC (3 of 3)]
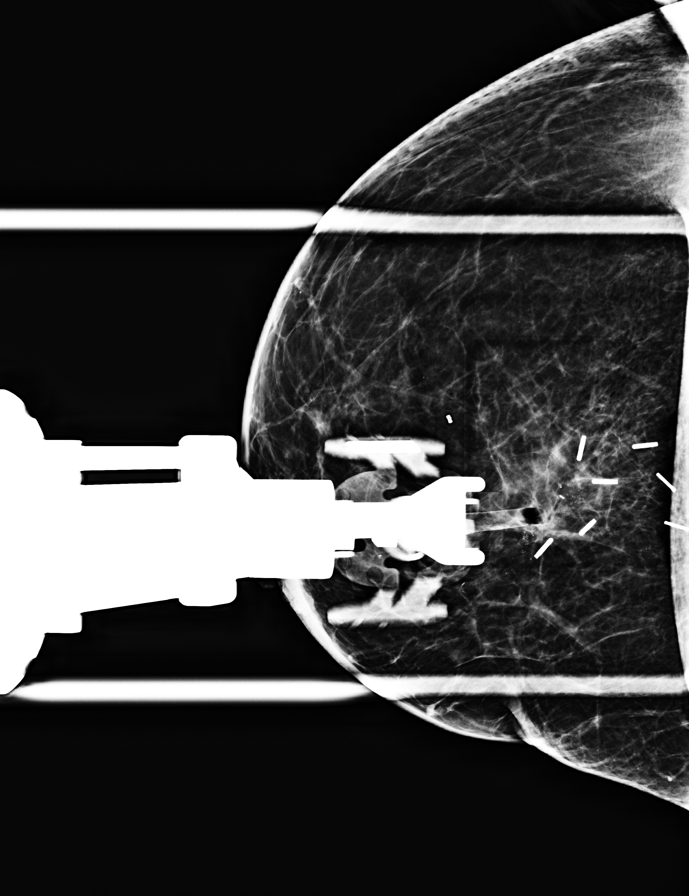

[8 of 23 positions shown; findings below may reference images not displayed]



Using sterile technique and 1% Lidocaine as local anesthetic, under
stereotactic guidance, a 9 gauge vacuum assisted device was used to
perform core needle biopsy of calcifications in the upper outer
quadrant of the left breast using a superior approach. Specimen
radiograph was performed showing multiple calcifications in each of
the core samples. Specimens with calcifications are identified for
pathology.

Lesion quadrant: Upper-outer

At the conclusion of the procedure, X shaped tissue marker clip was
deployed into the biopsy cavity. Follow-up 2-view mammogram was
performed and dictated separately.
IMPRESSION: Stereotactic-guided biopsy of calcifications in the upper-outer left
breast. No apparent complications.

## 2019-06-21 ENCOUNTER — Other Ambulatory Visit: Payer: Self-pay | Admitting: Gastroenterology

## 2019-06-25 ENCOUNTER — Other Ambulatory Visit: Payer: Self-pay

## 2019-06-26 ENCOUNTER — Inpatient Hospital Stay (HOSPITAL_COMMUNITY): Payer: Medicare HMO | Attending: Hematology | Admitting: Hematology

## 2019-06-26 ENCOUNTER — Encounter (HOSPITAL_COMMUNITY): Payer: Self-pay | Admitting: Hematology

## 2019-06-26 DIAGNOSIS — C50412 Malignant neoplasm of upper-outer quadrant of left female breast: Secondary | ICD-10-CM

## 2019-06-26 DIAGNOSIS — Z780 Asymptomatic menopausal state: Secondary | ICD-10-CM | POA: Diagnosis not present

## 2019-06-26 DIAGNOSIS — Z122 Encounter for screening for malignant neoplasm of respiratory organs: Secondary | ICD-10-CM | POA: Diagnosis not present

## 2019-06-26 DIAGNOSIS — Z170 Estrogen receptor positive status [ER+]: Secondary | ICD-10-CM

## 2019-06-26 NOTE — Progress Notes (Signed)
Virtual Visit via Telephone Note  I connected with Regina Mcmahon on 06/26/19 at 10:30 AM EST by telephone and verified that I am speaking with the correct person using two identifiers.   I discussed the limitations, risks, security and privacy concerns of performing an evaluation and management service by telephone and the availability of in person appointments. I also discussed with the patient that there may be a patient responsible charge related to this service. The patient expressed understanding and agreed to proceed.   History of Present Illness: She is seen in our clinic for history of stage IIb left breast cancer diagnosed in December 2015.  This was ER/PR positive and HER-2 negative.  She underwent left lumpectomy and sentinel lymph node biopsy on 08/20/2014.  Received adjuvant chemotherapy with CMF x6 cycles.  Genetic testing was negative.  Received adjuvant XRT and completed treatment in October 2016.  Started on Aromasin in November 2016, switched to anastrozole in January 2017 due to severe hot flashes and mood swings.  Arimidex switched to tamoxifen in May 2017 due to worsening depression.   Observations/Objective: She reports that she has been taking tamoxifen without any major problems.  However she reported some pains in her back which could be from degenerative disc disease.  Denies any mood swings.  Denies any major hot flashes.  Denies any new onset pains.  No fevers, night sweats or weight loss.  Assessment and Plan:  1.  Stage IIb left breast cancer: -Diagnosed in December 2015, ER/PR positive and HER-2 negative.  She underwent left lumpectomy and sentinel lymph node biopsy in January 2016. -Received adjuvant chemotherapy with CMF x6 cycles.  Genetic testing was negative. -Received adjuvant XRT completed in October 2016. -Aromasin started a #2016, switched to anastrozole in January 2017 due to severe hot flashes and mood swings. -Arimidex switched to tamoxifen in May 2017  due to worsening depression. -She is tolerating tamoxifen very well. -Mammogram on 06/11/2019 showed new calcifications in the lumpectomy bed. -She had a left breast biopsy on 06/19/2019 which showed dystrophic calcification and scar tissue.  2.  Osteopenia: -DEXA scan on 07/01/2015 showed osteopenia. -I have recommended repeating a DEXA scan. -We will also check her vitamin D level prior to next visit.  3.  Smoking history: -She smoked 2 packs/day for almost 39 years. -I have counseled her extensively about the lung cancer screening and improvement in overall survival. -She agreed to have screening.  We will schedule her for CT low-dose scan for lung screening.   Follow Up Instructions:  RTC after the DEXA scan, blood work and CT scan. I discussed the assessment and treatment plan with the patient. The patient was provided an opportunity to ask questions and all were answered. The patient agreed with the plan and demonstrated an understanding of the instructions.   The patient was advised to call back or seek an in-person evaluation if the symptoms worsen or if the condition fails to improve as anticipated.  I provided 21 minutes of non-face-to-face time during this encounter.   Derek Jack, MD

## 2019-07-01 ENCOUNTER — Encounter (HOSPITAL_COMMUNITY): Payer: Self-pay | Admitting: *Deleted

## 2019-07-08 ENCOUNTER — Ambulatory Visit: Payer: Medicare HMO | Admitting: Neurology

## 2019-08-05 ENCOUNTER — Other Ambulatory Visit: Payer: Self-pay

## 2019-08-05 ENCOUNTER — Telehealth (HOSPITAL_COMMUNITY): Payer: Self-pay | Admitting: *Deleted

## 2019-08-05 ENCOUNTER — Ambulatory Visit (HOSPITAL_COMMUNITY): Payer: Medicare HMO

## 2019-08-05 ENCOUNTER — Ambulatory Visit (HOSPITAL_COMMUNITY)
Admission: RE | Admit: 2019-08-05 | Discharge: 2019-08-05 | Disposition: A | Discharge status: Home / Self Care | Payer: Medicare HMO | Source: Ambulatory Visit | Attending: Hematology | Admitting: Hematology

## 2019-08-05 ENCOUNTER — Encounter (HOSPITAL_COMMUNITY): Payer: Self-pay | Admitting: *Deleted

## 2019-08-05 DIAGNOSIS — Z780 Asymptomatic menopausal state: Secondary | ICD-10-CM | POA: Insufficient documentation

## 2019-08-05 NOTE — Telephone Encounter (Signed)
Created in error

## 2019-08-05 NOTE — Progress Notes (Signed)
Patient has called and cancelled her LDCT lung cancer screening exam this week. She states that she feels like she doesn't need the exam. She is coming to see the doctor on Wednesday.  Our schedulers advised her to talk with the doctor on Wednesday for follow up.

## 2019-08-06 ENCOUNTER — Other Ambulatory Visit: Payer: Self-pay

## 2019-08-07 ENCOUNTER — Inpatient Hospital Stay (HOSPITAL_COMMUNITY): Payer: Medicare HMO

## 2019-08-07 ENCOUNTER — Inpatient Hospital Stay (HOSPITAL_COMMUNITY): Payer: Medicare HMO | Attending: Hematology | Admitting: Hematology

## 2019-08-07 ENCOUNTER — Encounter (HOSPITAL_COMMUNITY): Payer: Self-pay | Admitting: Hematology

## 2019-08-07 VITALS — BP 148/69 | HR 77 | Temp 97.3°F | Resp 18 | Wt 239.2 lb

## 2019-08-07 DIAGNOSIS — C50412 Malignant neoplasm of upper-outer quadrant of left female breast: Secondary | ICD-10-CM | POA: Diagnosis not present

## 2019-08-07 DIAGNOSIS — Z8719 Personal history of other diseases of the digestive system: Secondary | ICD-10-CM | POA: Insufficient documentation

## 2019-08-07 DIAGNOSIS — Z170 Estrogen receptor positive status [ER+]: Secondary | ICD-10-CM

## 2019-08-07 DIAGNOSIS — F17210 Nicotine dependence, cigarettes, uncomplicated: Secondary | ICD-10-CM | POA: Diagnosis not present

## 2019-08-07 DIAGNOSIS — Z801 Family history of malignant neoplasm of trachea, bronchus and lung: Secondary | ICD-10-CM | POA: Diagnosis not present

## 2019-08-07 DIAGNOSIS — Z79899 Other long term (current) drug therapy: Secondary | ICD-10-CM | POA: Diagnosis not present

## 2019-08-07 DIAGNOSIS — R197 Diarrhea, unspecified: Secondary | ICD-10-CM | POA: Diagnosis not present

## 2019-08-07 DIAGNOSIS — Z79811 Long term (current) use of aromatase inhibitors: Secondary | ICD-10-CM | POA: Diagnosis not present

## 2019-08-07 DIAGNOSIS — K5900 Constipation, unspecified: Secondary | ICD-10-CM | POA: Insufficient documentation

## 2019-08-07 DIAGNOSIS — R200 Anesthesia of skin: Secondary | ICD-10-CM | POA: Insufficient documentation

## 2019-08-07 DIAGNOSIS — Z833 Family history of diabetes mellitus: Secondary | ICD-10-CM | POA: Diagnosis not present

## 2019-08-07 DIAGNOSIS — Z806 Family history of leukemia: Secondary | ICD-10-CM | POA: Insufficient documentation

## 2019-08-07 DIAGNOSIS — G8929 Other chronic pain: Secondary | ICD-10-CM | POA: Insufficient documentation

## 2019-08-07 DIAGNOSIS — M8580 Other specified disorders of bone density and structure, unspecified site: Secondary | ICD-10-CM | POA: Insufficient documentation

## 2019-08-07 DIAGNOSIS — Z808 Family history of malignant neoplasm of other organs or systems: Secondary | ICD-10-CM | POA: Diagnosis not present

## 2019-08-07 DIAGNOSIS — Z836 Family history of other diseases of the respiratory system: Secondary | ICD-10-CM | POA: Diagnosis not present

## 2019-08-07 DIAGNOSIS — Z8051 Family history of malignant neoplasm of kidney: Secondary | ICD-10-CM | POA: Insufficient documentation

## 2019-08-07 DIAGNOSIS — Z800 Family history of malignant neoplasm of digestive organs: Secondary | ICD-10-CM | POA: Insufficient documentation

## 2019-08-07 DIAGNOSIS — M7989 Other specified soft tissue disorders: Secondary | ICD-10-CM | POA: Diagnosis not present

## 2019-08-07 DIAGNOSIS — M25551 Pain in right hip: Secondary | ICD-10-CM | POA: Diagnosis not present

## 2019-08-07 DIAGNOSIS — Z8249 Family history of ischemic heart disease and other diseases of the circulatory system: Secondary | ICD-10-CM | POA: Diagnosis not present

## 2019-08-07 DIAGNOSIS — Z923 Personal history of irradiation: Secondary | ICD-10-CM | POA: Diagnosis not present

## 2019-08-07 DIAGNOSIS — Z9221 Personal history of antineoplastic chemotherapy: Secondary | ICD-10-CM | POA: Diagnosis not present

## 2019-08-07 LAB — CBC WITH DIFFERENTIAL/PLATELET
Abs Immature Granulocytes: 0.01 10*3/uL (ref 0.00–0.07)
Basophils Absolute: 0 10*3/uL (ref 0.0–0.1)
Basophils Relative: 1 %
Eosinophils Absolute: 0.3 10*3/uL (ref 0.0–0.5)
Eosinophils Relative: 3 %
HCT: 49.9 % — ABNORMAL HIGH (ref 36.0–46.0)
Hemoglobin: 16.7 g/dL — ABNORMAL HIGH (ref 12.0–15.0)
Immature Granulocytes: 0 %
Lymphocytes Relative: 18 %
Lymphs Abs: 1.5 10*3/uL (ref 0.7–4.0)
MCH: 31.5 pg (ref 26.0–34.0)
MCHC: 33.5 g/dL (ref 30.0–36.0)
MCV: 94 fL (ref 80.0–100.0)
Monocytes Absolute: 0.4 10*3/uL (ref 0.1–1.0)
Monocytes Relative: 5 %
Neutro Abs: 5.9 10*3/uL (ref 1.7–7.7)
Neutrophils Relative %: 73 %
Platelets: 206 10*3/uL (ref 150–400)
RBC: 5.31 MIL/uL — ABNORMAL HIGH (ref 3.87–5.11)
RDW: 12.2 % (ref 11.5–15.5)
WBC: 8.1 10*3/uL (ref 4.0–10.5)
nRBC: 0 % (ref 0.0–0.2)

## 2019-08-07 LAB — COMPREHENSIVE METABOLIC PANEL
ALT: 17 U/L (ref 0–44)
AST: 16 U/L (ref 15–41)
Albumin: 3.7 g/dL (ref 3.5–5.0)
Alkaline Phosphatase: 59 U/L (ref 38–126)
Anion gap: 8 (ref 5–15)
BUN: 14 mg/dL (ref 8–23)
CO2: 29 mmol/L (ref 22–32)
Calcium: 9.1 mg/dL (ref 8.9–10.3)
Chloride: 103 mmol/L (ref 98–111)
Creatinine, Ser: 0.68 mg/dL (ref 0.44–1.00)
GFR calc Af Amer: >60 mL/min (ref >60)
GFR calc non Af Amer: >60 mL/min (ref >60)
Glucose, Bld: 109 mg/dL — ABNORMAL HIGH (ref 70–99)
Potassium: 3.3 mmol/L — ABNORMAL LOW (ref 3.5–5.1)
Sodium: 140 mmol/L (ref 135–145)
Total Bilirubin: 1 mg/dL (ref 0.3–1.2)
Total Protein: 6.9 g/dL (ref 6.5–8.1)

## 2019-08-07 LAB — VITAMIN D 25 HYDROXY (VIT D DEFICIENCY, FRACTURES): Vit D, 25-Hydroxy: 12.03 ng/mL — ABNORMAL LOW (ref 30–100)

## 2019-08-07 NOTE — Progress Notes (Signed)
[address], [place] [address]   CLINIC:  Medical Oncology/Hematology  PCP:  Sinda Du, [place] [address] [phone]   REASON FOR VISIT:  Follow-up for breast cancer, osteopenia.  CURRENT THERAPY: Tamoxifen daily.  BRIEF ONCOLOGIC HISTORY:  Oncology History  Carcinoma of upper-outer quadrant of left female breast ([place])  06/24/2014 Mammogram   Left breast: 6 x 3 x 2.5 cm area of ill-defined increased density in the UOQ,  corresponding to the mass felt by the patient, not in the same location of the previously biopsied benign calcifications.    06/24/2014 Breast US   Left breast: ill-defined predominantly hypoechoic area with some increased echogenicity in the 2 o'clock position of the left breast, 7 cm from the nipple. 2.6 x 2.3 x 1.8 cm in maximum dimensions.   07/01/2014 Initial Biopsy   Left breast core needle bx: Invasive mammary carcinoma with lobular features, ER+ (100%), PR+ (91%), HER2/neu negative (ratio 1.09), Ki-67 32%. E-cadherin strongly positive, diagnostic for IDC    07/01/2014 Clinical Stage   Stage IIA: T2 N0   08/20/2014 Definitive Surgery   Left breast seed localized lumpectomy/SLNB: IDC, +LVI, DCIS, 1 LN removed and positive for metastatic carcinoma. Grade 2. HER2/neu repeated and negative (ratio 0.69-1.9).    08/20/2014 Pathologic Stage   Stage IIB: pT2 pN1a pMx   09/17/2014 Surgery   Port-a cath placement and re-excision   10/02/2014 - 01/15/2015 Chemotherapy   CMF x 6 cycles.  patient refused Taxane-containing chemotherapy.   03/07/2015 Procedure   Comp Cancer Panel reveals no variant at APC, ATM, AXIN2, BARD1, BMPR1A, BRCA1, BRCA2, BRIP1, CDH1, CDK4, CDKN2A, CHEK2, FANCC, MLH1, MSH2, MSH6, MUTYH, NBN, PALB2, PMS2, POLD1, POLE, PTEN, RAD51C, RAD51D, SMAD4, STK11, TP53, VHL, and XRCC2.   04/06/2015 - 05/21/2015 Radiation Therapy   Adjuvant RT Pablo Ledger): Left breast/ 45 Gy over 25  fractions.  Left supraclavicular fossa and axilla/ 45 Gy over 25 fractions. Left breast boost/ 16 Gy over 8 fractions. Total dose: 61 Gy   06/04/2015 - 08/07/2015 Anti-estrogen oral therapy   Exemestane 25 mg daily.  Planned duration of therapy 5 years.    07/23/2015 Survivorship   Survivorship care plan completed and mailed to patient in lieu of in person visit   08/06/2015 Adverse Reaction   Severe hot flashes and mood swings   08/08/2015 - 12/07/2015 Anti-estrogen oral therapy   Arimidex   12/04/2015 Adverse Reaction   Worsening depression, patient stopped Arimidex   12/07/2015 -  Anti-estrogen oral therapy   Tamoxifen      CANCER STAGING: Cancer Staging Carcinoma of upper-outer quadrant of left female breast [facility]) Staging form: Breast, AJCC 7th Edition - Clinical stage from 07/01/2014: Stage IIA (T2, N0, M0) - Unsigned - Pathologic stage from 08/20/2014: Stage IIB (T2, N1a, cM0) - Unsigned    INTERVAL HISTORY:  Regina Mcmahon 69 y.o. female seen for follow-up of left breast cancer and osteopenia.  She is continuing to smoke 2 packs/day.  She is taking tamoxifen without any problems.  Denies any new onset pains.  She reportedly had mammogram in November which led to a biopsy which was benign.  She reported small pea-sized nodule in the right medial breast around the sternum area.  It does not hurt.  Chronic right hip pain has been stable.  She is planning to undergo scans for it.    REVIEW OF SYSTEMS:  Review of Systems  Cardiovascular: Positive for leg swelling.  Gastrointestinal:  Positive for constipation and diarrhea.  Musculoskeletal:       Right hip pain.  Neurological: Positive for numbness.  All other systems reviewed and are negative.    PAST MEDICAL/SURGICAL HISTORY:  Past Medical History:  Diagnosis Date  . Asthma   . Asymptomatic varicose veins   . Breast cancer ([place]) 06/2014   left  . Chronic airway obstruction ([place])   . COPD (chronic obstructive pulmonary  disease) ([place])   . Depression   . Hemorrhage rectum    and anus.  . Hypertension   . Insomnia   . Other symptoms involving digestive system(787.99)   . Pain    Hx.pain in joint involving pelvic region and thigh; pain in limb  . Palpitations   . Panic attacks   . Personal history of chemotherapy 2016  . Personal history of radiation therapy 2016  . Sinusitis   . Symptomatic menopausal or female climacteric states    Past Surgical History:  Procedure Laterality Date  . BREAST BIOPSY Left 10/2012  . BREAST BIOPSY Left 07/01/2014   malignant  . BREAST LUMPECTOMY Left 08/20/2014  . CESAREAN SECTION    . CHOLECYSTECTOMY  1985  . COLONOSCOPY  2002   Dr. Lindalou Hose: internal hemorrhoids, one small rectal polyp, adenomatous path   . COLONOSCOPY N/A 11/23/2015   Procedure: COLONOSCOPY;  Surgeon: Danie Binder, MD;  [facility];  Service: Endoscopy;  Laterality: N/A;  1100 - moved to 10:45 - office to notify  . ESOPHAGOGASTRODUODENOSCOPY N/A 11/23/2015   Procedure: ESOPHAGOGASTRODUODENOSCOPY (EGD);  Surgeon: Danie Binder, MD;  [facility];  Service: Endoscopy;  Laterality: N/A;  . PORT-A-CATH REMOVAL  02/2015  . PORTACATH PLACEMENT Right 09/17/2014   Procedure: INSERTION PORT-A-CATH WITH ULTRASOUND;  Surgeon: Erroll Luna, MD;  Location: [facility];  Service: General;  Laterality: Right;  IJ  . RADIOACTIVE SEED GUIDED PARTIAL MASTECTOMY WITH AXILLARY SENTINEL LYMPH NODE BIOPSY Left 08/20/2014   Procedure: LEFT BREAST LUMPECTOMY WITH RADIOACTIVE SEED LOCALIZATION AND SENTINEL LYMPH NODE MAPPING;  Surgeon: Erroll Luna, MD;  Location: [facility];  Service: General;  Laterality: Left;  . RE-EXCISION OF BREAST LUMPECTOMY Left 09/17/2014   Procedure: RE-EXCISION OF BREAST LUMPECTOMY;  Surgeon: Erroll Luna, MD;  Location: [facility];  Service: General;  Laterality: Left;  . TUBAL LIGATION       SOCIAL HISTORY:  Social  History   Socioeconomic History  . Marital status: Divorced    Spouse name: Not on file  . Number of children: 2  . Years of education: Not on file  . Highest education level: Not on file  Occupational History  . Not on file  Tobacco Use  . Smoking status: Current Every Day Smoker    Packs/day: 1.00    Types: Cigarettes    Last attempt to quit: 02/03/2016    Years since quitting: 3.5  . Smokeless tobacco: Never Used  Substance and Sexual Activity  . Alcohol use: No    Alcohol/week: 0.0 standard drinks  . Drug use: No  . Sexual activity: Never    Birth control/protection: None  Other Topics Concern  . Not on file  Social History Narrative  . Not on file   Social Determinants of Health   Financial Resource Strain:   . Difficulty of Paying Living Expenses: Not on file  Food Insecurity:   . Worried About Charity fundraiser in the Last Year: Not on file  . Ran  Out of Food in the Last Year: Not on file  Transportation Needs:   . Lack of Transportation (Medical): Not on file  . Lack of Transportation (Non-Medical): Not on file  Physical Activity:   . Days of Exercise per Week: Not on file  . Minutes of Exercise per Session: Not on file  Stress:   . Feeling of Stress : Not on file  Social Connections:   . Frequency of Communication with Friends and Family: Not on file  . Frequency of Social Gatherings with Friends and Family: Not on file  . Attends Religious Services: Not on file  . Active Member of Clubs or Organizations: Not on file  . Attends Archivist Meetings: Not on file  . Marital Status: Not on file  Intimate Partner Violence:   . Fear of Current or Ex-Partner: Not on file  . Emotionally Abused: Not on file  . Physically Abused: Not on file  . Sexually Abused: Not on file    FAMILY HISTORY:  Family History  Problem Relation Age of Onset  . Diabetes Mother   . Ovarian cancer Mother 90  . Other Father        died in MVA at age 69  . Other  Sister        mitral valve disorder  . Melanoma Sister 54       removed from back of leg  . Colon cancer Brother        early 47s, succumbed to disease  . Cancer Paternal Aunt        leukemia, bone, or lung cancer  . Alzheimer's disease Maternal Grandmother   . Heart attack Maternal Grandfather   . Heart attack Paternal Grandmother   . COPD Paternal Grandfather   . Emphysema Paternal Grandfather   . Congestive Heart Failure Paternal Aunt   . Stomach cancer Paternal Uncle   . Kidney cancer Maternal Uncle   . Cancer Cousin        dx. teens/dx. 40s  . Cancer Cousin   . Colon cancer Cousin     CURRENT MEDICATIONS:  Outpatient Encounter Medications as of 08/07/2019  Medication Sig  . calcium carbonate (TUMS - DOSED IN MG ELEMENTAL CALCIUM) 500 MG chewable tablet Chew 1 tablet by mouth 3 (three) times daily with meals. Reported on 12/07/2015  . hydrochlorothiazide (MICROZIDE) 12.5 MG capsule Take 12.5 mg by mouth daily. Reported on 12/07/2015  . ibuprofen (ADVIL) 800 MG tablet Take 800 mg by mouth 3 (three) times daily.  . pantoprazole (PROTONIX) 40 MG tablet Take 1 tablet by mouth once daily  . sertraline (ZOLOFT) 100 MG tablet Take 100 mg by mouth daily.  . tamoxifen (NOLVADEX) 20 MG tablet Take 1 tablet (20 mg total) by mouth daily.  Marland Kitchen albuterol (PROVENTIL HFA;VENTOLIN HFA) 108 (90 BASE) MCG/ACT inhaler Inhale 2 puffs into the lungs every 6 (six) hours as needed for wheezing or shortness of breath.  . ALPRAZolam (XANAX) 0.5 MG tablet Take 0.5 mg by mouth 3 (three) times daily as needed for anxiety.  . traMADol-acetaminophen (ULTRACET) 37.5-325 MG tablet Take 1 tablet by mouth every 4 (four) hours as needed. (Patient not taking: Reported on 06/26/2019)  . [DISCONTINUED] sertraline (ZOLOFT) 50 MG tablet Take 50 mg by mouth daily.   No facility-administered encounter medications on file as of 08/07/2019.    ALLERGIES:  No Known Allergies   PHYSICAL EXAM:  ECOG Performance status:  1  Vitals:   08/07/19 1518  BP: Marland Kitchen)  148/69  Pulse: 77  Resp: 18  Temp: (!) 97.3 F (36.3 C)  SpO2: 94%   Filed Weights   08/07/19 1518  Weight: 239 lb 3.2 oz (108.5 kg)    Physical Exam Vitals reviewed.  Constitutional:      Appearance: Normal appearance.  Cardiovascular:     Rate and Rhythm: Normal rate and regular rhythm.     Heart sounds: Normal heart sounds.  Pulmonary:     Effort: Pulmonary effort is normal.     Breath sounds: Normal breath sounds.  Abdominal:     General: There is no distension.     Palpations: Abdomen is soft. There is no mass.  Skin:    General: Skin is warm.  Neurological:     General: No focal deficit present.     Mental Status: She is alert and oriented to person, place, and time.  Psychiatric:        Mood and Affect: Mood normal.        Behavior: Behavior normal.    No palpable breast masses bilaterally.  There is a pea-sized hardened area subcutaneously in the medial aspect of the right breast close to the sternum.  This is a subcutaneous nodule.  LABORATORY DATA:  I have reviewed the labs as listed.  CBC    Component Value Date/Time   WBC 8.1 08/07/2019 1434   RBC 5.31 (H) 08/07/2019 1434   HGB 16.7 (H) 08/07/2019 1434   HCT 49.9 (H) 08/07/2019 1434   PLT 206 08/07/2019 1434   MCV 94.0 08/07/2019 1434   MCH 31.5 08/07/2019 1434   MCHC 33.5 08/07/2019 1434   RDW 12.2 08/07/2019 1434   LYMPHSABS 1.5 08/07/2019 1434   MONOABS 0.4 08/07/2019 1434   EOSABS 0.3 08/07/2019 1434   BASOSABS 0.0 08/07/2019 1434   CMP Latest Ref Rng & Units 08/07/2019 03/08/2018 09/15/2017  Glucose 70 - 99 mg/dL 109(H) 136(H) 159(H)  BUN 8 - 23 mg/dL '14 15 16  ' Creatinine 0.44 - 1.00 mg/dL 0.68 0.86 0.90  Sodium 135 - 145 mmol/L 140 140 139  Potassium 3.5 - 5.1 mmol/L 3.3(L) 4.4 3.8  Chloride 98 - 111 mmol/L 103 105 102  CO2 22 - 32 mmol/L '29 30 27  ' Calcium 8.9 - 10.3 mg/dL 9.1 8.8(L) 9.6  Total Protein 6.5 - 8.1 g/dL 6.9 6.6 7.1  Total Bilirubin  0.3 - 1.2 mg/dL 1.0 1.1 0.9  Alkaline Phos 38 - 126 U/L 59 59 58  AST 15 - 41 U/L '16 22 23  ' ALT 0 - 44 U/L '17 29 23       ' DIAGNOSTIC IMAGING:  I have independently reviewed the scans and discussed with the patient.     ASSESSMENT & PLAN:   Carcinoma of upper-outer quadrant of left female breast 1.  Stage IIb (T2 N1 M0) left breast IDC: -Status post lumpectomy on 08/20/2014 followed by CMF x6 cycles, patient refused taxane therapy.  Tumor was ER/PR positive and HER-2 negative.  This was followed by XRT. -Aromasin started on 06/04/2015, switched Arimidex on 08/07/2015, and finally took tamoxifen on 12/07/2015. -She is reportedly tolerating tamoxifen very well. -Mammogram on 06/11/2019 suggested new calcifications at the lumpectomy site. -She underwent biopsy on 06/18/2019 which showed scar with necrosis and dystrophic calcifications with no evidence of malignancy. -As she is tolerating tamoxifen very well, I have recommended extended antiestrogen therapy beyond 5 years.  She is agreeable. -We reviewed her blood work which is grossly within normal limits. -We will see  her back in 6 months for follow-up.  2.  Osteopenia: -I reviewed results of DEXA scan from 08/05/2019 which showed T score of -1.9. -This is stable compared to previous DEXA scan. -I have recommended calcium and vitamin D supplements. -We will check her vitamin D level.  3.  Smoking history: -She smokes 2 packs/day for 39 years. -I have extensively counseled her about low-dose CT for screening for lung cancer.  We also discussed survival advantage with yearly screenings. -She would like to think about it and call us back.   Total time spent 30 minutes with more than 60% of time spent face-to-face counseling patient.  Orders placed this encounter:  Orders Placed This Encounter  Procedures  . CBC with Differential/Platelet  . Comprehensive metabolic panel  . Vitamin D 25 hydroxy      Derek Jack,  MD [place] [phone]

## 2019-08-07 NOTE — Assessment & Plan Note (Signed)
1.  Stage IIb (T2 N1 M0) left breast IDC: -Status post lumpectomy on 08/20/2014 followed by CMF x6 cycles, patient refused taxane therapy.  Tumor was ER/PR positive and HER-2 negative.  This was followed by XRT. -Aromasin started on 06/04/2015, switched Arimidex on 08/07/2015, and finally took tamoxifen on 12/07/2015. -She is reportedly tolerating tamoxifen very well. -Mammogram on 06/11/2019 suggested new calcifications at the lumpectomy site. -She underwent biopsy on 06/18/2019 which showed scar with necrosis and dystrophic calcifications with no evidence of malignancy. -As she is tolerating tamoxifen very well, I have recommended extended antiestrogen therapy beyond 5 years.  She is agreeable. -We reviewed her blood work which is grossly within normal limits. -We will see her back in 6 months for follow-up.  2.  Osteopenia: -I reviewed results of DEXA scan from 08/05/2019 which showed T score of -1.9. -This is stable compared to previous DEXA scan. -I have recommended calcium and vitamin D supplements. -We will check her vitamin D level.  3.  Smoking history: -She smokes 2 packs/day for 39 years. -I have extensively counseled her about low-dose CT for screening for lung cancer.  We also discussed survival advantage with yearly screenings. -She would like to think about it and call us back.

## 2019-08-07 NOTE — Patient Instructions (Addendum)
[place] at [facility] Discharge Instructions  You were seen today by Dr. Delton Coombes. He went over your recent lab results. Your bone density test looked normal as well. He will see you back in 6 months for labs and follow up.   Thank you for choosing [place] at [facility] to provide your oncology and hematology care.  To afford each patient quality time with our provider, please arrive at least 15 minutes before your scheduled appointment time.   If you have a lab appointment with the [facility] please come in thru the  Main Entrance and check in at the main information desk  You need to re-schedule your appointment should you arrive 10 or more minutes late.  We strive to give you quality time with our providers, and arriving late affects you and other patients whose appointments are after yours.  Also, if you no show three or more times for appointments you may be dismissed from the clinic at the providers discretion.     Again, thank you for choosing [facility].  Our hope is that these requests will decrease the amount of time that you wait before being seen by our physicians.       _____________________________________________________________  Should you have questions after your visit to [facility], please contact our office at [phone] between the hours of 8:00 a.m. and 4:30 p.m.  Voicemails left after 4:00 p.m. will not be returned until the following business day.  For prescription refill requests, have your pharmacy contact our office and allow 72 hours.    Cancer Center Support Programs:   > Cancer Support Group  2nd Tuesday of the month 1pm-2pm, Journey Room

## 2019-09-17 ENCOUNTER — Telehealth: Payer: Self-pay | Admitting: Orthopedic Surgery

## 2019-09-17 DIAGNOSIS — M5136 Other intervertebral disc degeneration, lumbar region: Secondary | ICD-10-CM

## 2019-09-17 NOTE — Telephone Encounter (Signed)
Patient called requests to talk with or see Dr Aline Brochure for the same hip; states she did see Dr Ernestina Patches; said Dr Aline Brochure last discussed surgery with her, and mentioned about losing weight and discontinuing smoking. I relayed we will review her notes and call her back as soon as possible. Cool

## 2019-09-18 NOTE — Telephone Encounter (Signed)
(  cont'd) States she doesn't know what else to do for the pain of hip and back.  Also asking about the injection for her back, which she said she cancelled due to concerns around covid-19.  If this is the next step, is a new order needed?

## 2019-09-18 NOTE — Telephone Encounter (Signed)
Please see below, what would you recommend for patient?

## 2019-09-19 NOTE — Telephone Encounter (Signed)
Recommend injection   Until then   Can take tylenol 500 mg q 6hrs

## 2019-09-20 NOTE — Telephone Encounter (Signed)
Please see below, will you please call patient? Thanks-

## 2019-09-20 NOTE — Telephone Encounter (Signed)
Called and left message for patient to call the office.

## 2019-09-23 ENCOUNTER — Telehealth: Payer: Self-pay | Admitting: Radiology

## 2019-09-23 DIAGNOSIS — M5136 Other intervertebral disc degeneration, lumbar region: Secondary | ICD-10-CM

## 2019-09-23 NOTE — Telephone Encounter (Addendum)
You told patient to have injection in her back to help back pain She has not had MRI You ordered for her previously, but she declined to go I will have her go for MRI Have advised Dr Ernestina Patches to disregard order for injections until after MRI  To you FYI   I have put in the order again and left message for patient to go for MRI

## 2019-10-12 ENCOUNTER — Ambulatory Visit
Admission: RE | Admit: 2019-10-12 | Discharge: 2019-10-12 | Disposition: A | Discharge status: Home / Self Care | Payer: Medicare HMO | Source: Ambulatory Visit | Attending: Orthopedic Surgery | Admitting: Orthopedic Surgery

## 2019-10-12 ENCOUNTER — Other Ambulatory Visit: Payer: Self-pay

## 2019-10-12 DIAGNOSIS — M5136 Other intervertebral disc degeneration, lumbar region: Secondary | ICD-10-CM

## 2019-10-12 IMAGING — MR MR LUMBAR SPINE W/O CM
4 of 5 series · 18 of 48 positions shown · non-contrast
Comparison: None.

CLINICAL DATA: Low back pain for over 6 weeks

EXAM:
MRI LUMBAR SPINE WITHOUT CONTRAST
TECHNIQUE: Multiplanar, multisequence MR imaging of the lumbar spine was
performed. No intravenous contrast was administered.

[Series 9: T2 · sagittal · 4.0mm · 0.73mm/px · 6 of 15 slices shown (1 of 2)]
[im 1/15]
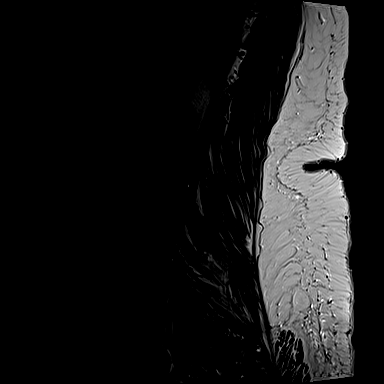
[im 3/15]
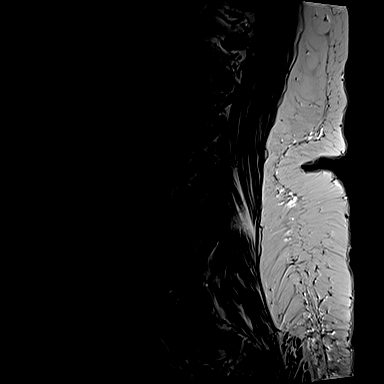
[im 6/15]
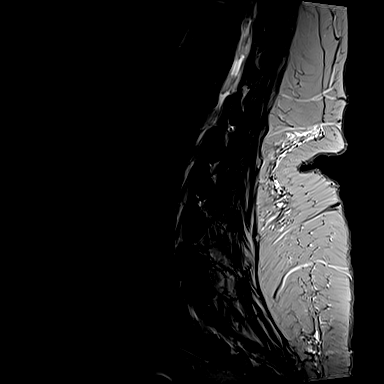
[im 9/15]
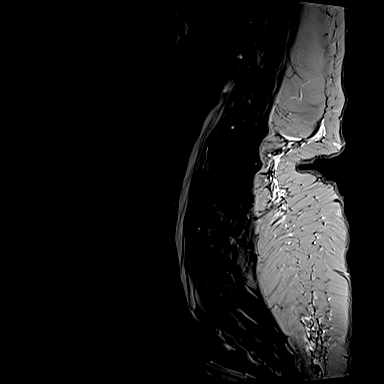
[im 12/15]
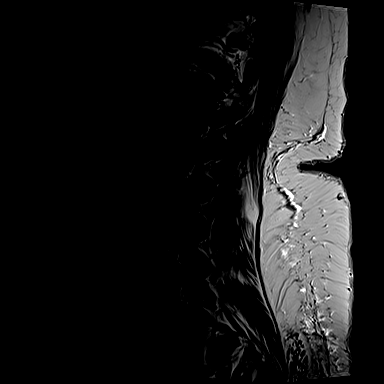
[im 15/15]
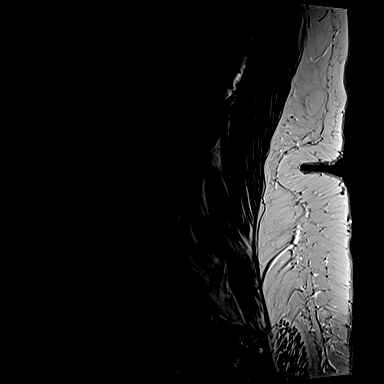

[Series 10: T1 · sagittal · 4.0mm · 0.73mm/px · 3 of 15 slices shown (1 of 2)]
[im 3/15]
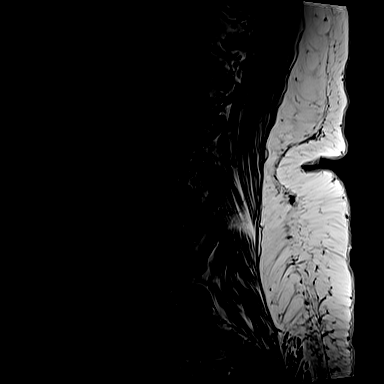
[im 9/15]
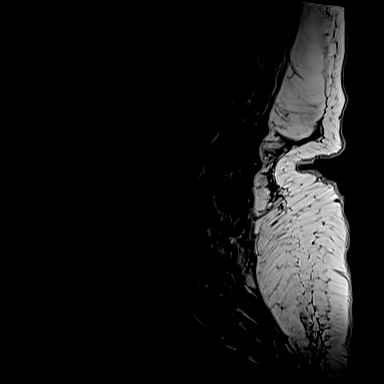
[im 15/15]
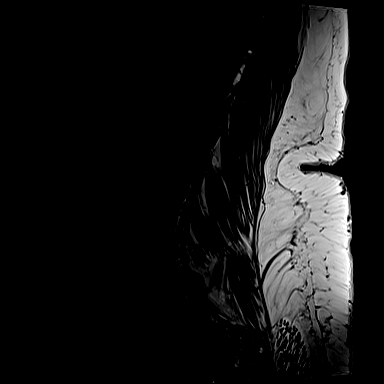

[Series 16: T2 · axial · 4.0mm · 0.28mm/px · z∈[-61,+127]mm · 6 of 40 slices shown (2 of 2)]
[im 1/40]
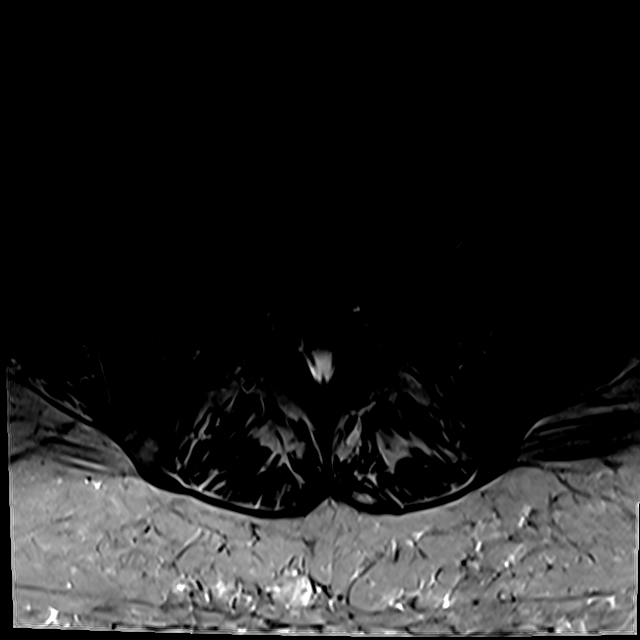
[im 6/40]
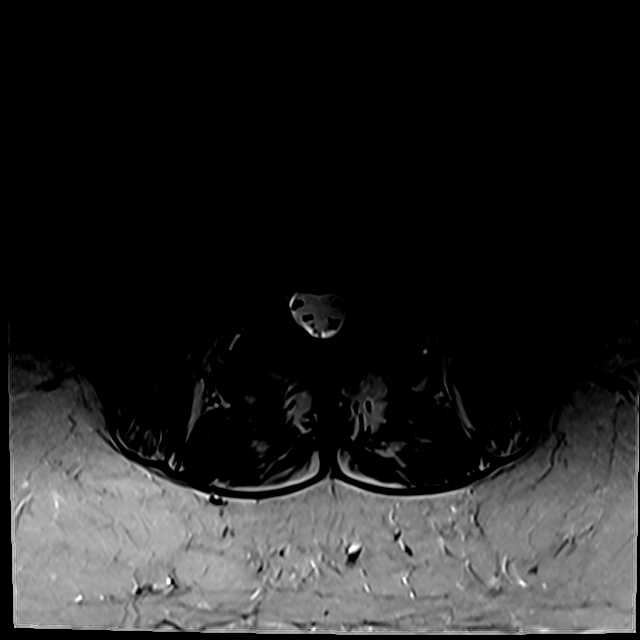
[im 12/40]
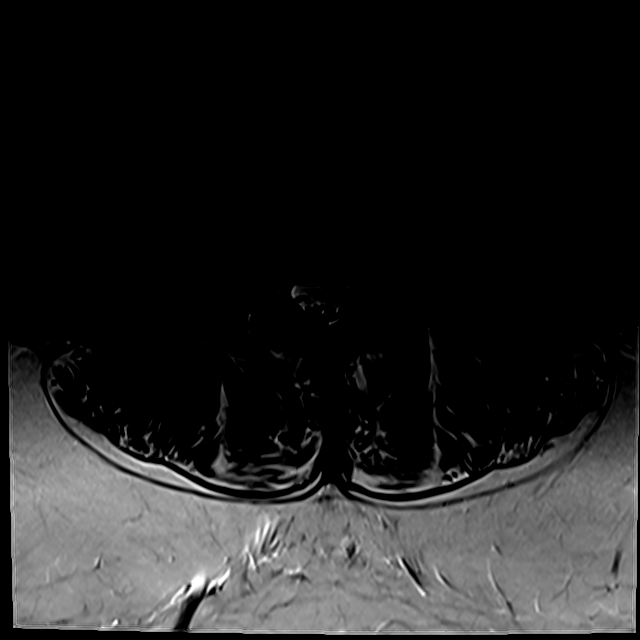
[im 17/40]
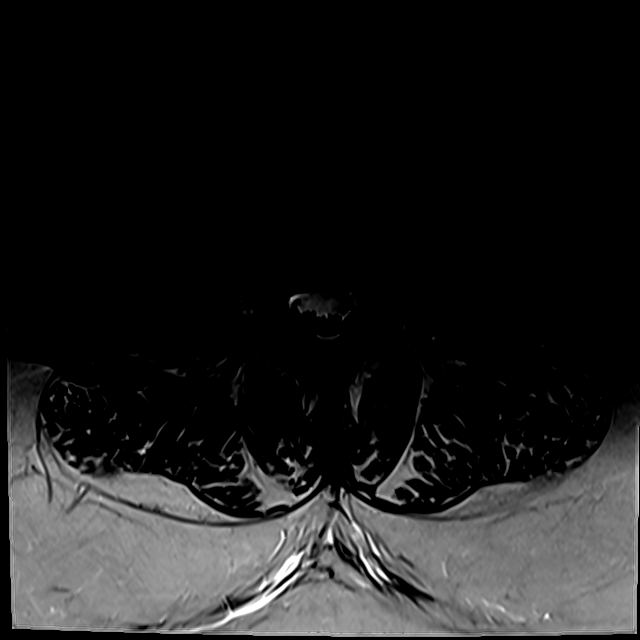
[im 20/40]
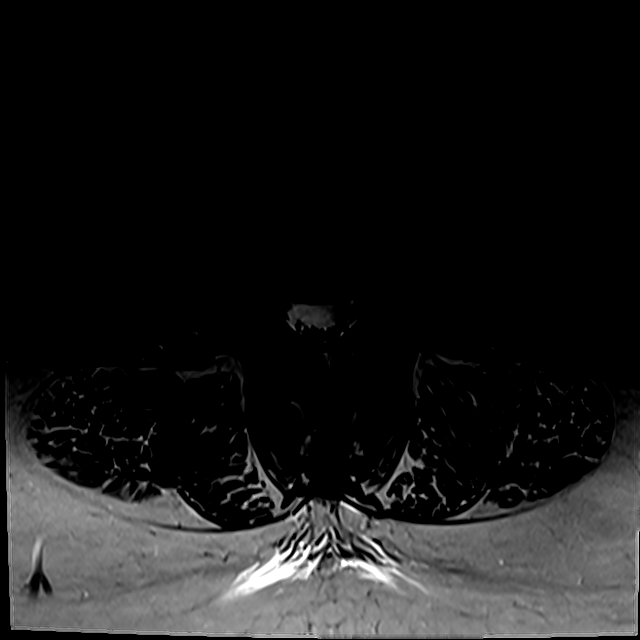
[im 34/40]
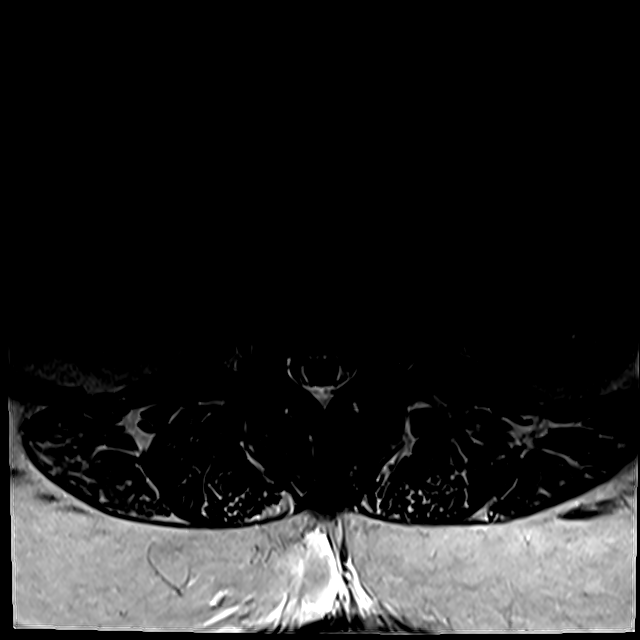

[Series 100: T1 · axial · 4.0mm · 0.28mm/px · z∈[-36,+127]mm · 3 of 40 slices shown (2 of 2)]
[im 6/40]
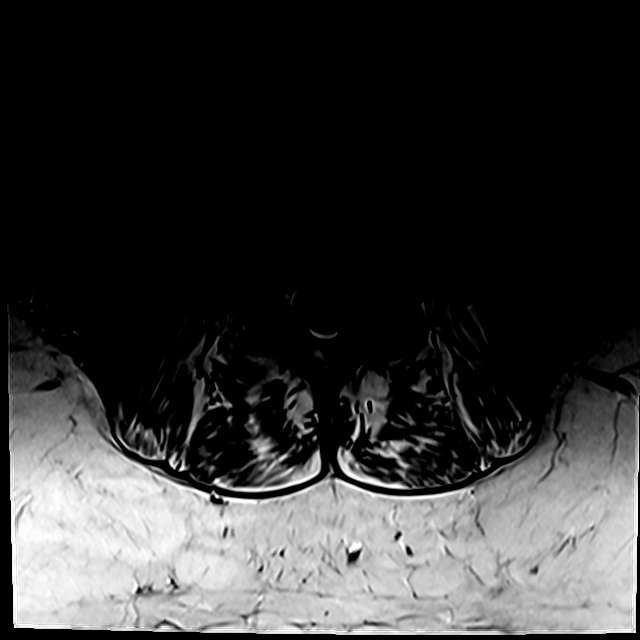
[im 20/40]
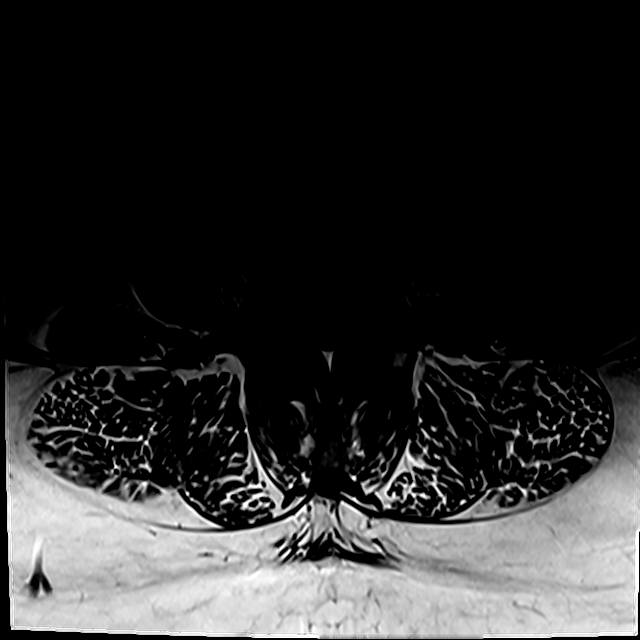
[im 34/40]
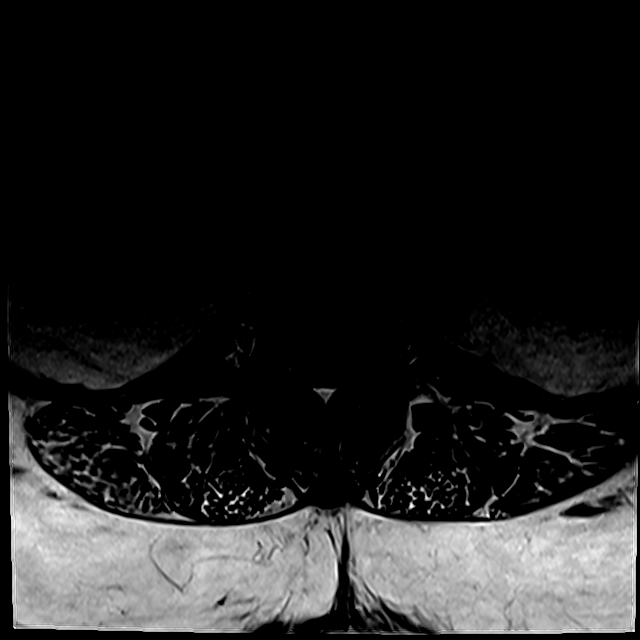

[18 of 48 positions shown; findings below may reference images not displayed]

FINDINGS: Segmentation:  Standard lumbar numbering

Alignment:  Grade 1 anterolisthesis at L4-5, facet mediated

Vertebrae:  No fracture, evidence of discitis, or bone lesion.

Conus medullaris and cauda equina: Conus extends to the L1-2 level.
Conus and cauda equina appear normal.

Paraspinal and other soft tissues: Negative

Disc levels:

Lower thoracic spondylosis.

T12- L1: Spondylosis. Mild disc bulging and ligamentum flavum
thickening. No impingement

L1-L2: Mild posterior element hypertrophy.  No impingement

L2-L3: Unremarkable.

L3-L4: Ligamentum flavum thickening and small subchondral cystic
change on the right. Minor disc bulging greatest towards the right
foramen. No neural compression

L4-L5: Facet degeneration with hypertrophy and small left joint
effusion. Mild triangular narrowing of the thecal sac. Moderate left
foraminal narrowing.

L5-S1:Unremarkable
IMPRESSION: 1. Degenerative changes most notable at L4-5 where there is facet
osteoarthritis with anterolisthesis and disc narrowing. Moderate
left foraminal narrowing at this level.
2. Diffusely patent spinal canal.

## 2019-10-15 ENCOUNTER — Telehealth: Payer: Self-pay | Admitting: Orthopedic Surgery

## 2019-10-16 ENCOUNTER — Ambulatory Visit: Payer: Medicare HMO | Admitting: Orthopedic Surgery

## 2019-10-16 ENCOUNTER — Other Ambulatory Visit: Payer: Self-pay

## 2019-10-16 ENCOUNTER — Encounter: Payer: Self-pay | Admitting: Orthopedic Surgery

## 2019-10-16 VITALS — BP 149/78 | HR 74 | Ht 64.0 in | Wt 238.0 lb

## 2019-10-16 DIAGNOSIS — R600 Localized edema: Secondary | ICD-10-CM | POA: Diagnosis not present

## 2019-10-16 DIAGNOSIS — F17200 Nicotine dependence, unspecified, uncomplicated: Secondary | ICD-10-CM | POA: Diagnosis not present

## 2019-10-16 DIAGNOSIS — M5410 Radiculopathy, site unspecified: Secondary | ICD-10-CM

## 2019-10-16 DIAGNOSIS — M1611 Unilateral primary osteoarthritis, right hip: Secondary | ICD-10-CM | POA: Diagnosis not present

## 2019-10-16 DIAGNOSIS — M5136 Other intervertebral disc degeneration, lumbar region: Secondary | ICD-10-CM

## 2019-10-16 DIAGNOSIS — Z6841 Body Mass Index (BMI) 40.0 and over, adult: Secondary | ICD-10-CM

## 2019-10-16 MED ORDER — TRAMADOL-ACETAMINOPHEN 37.5-325 MG PO TABS: 1.0000 | ORAL_TABLET | ORAL | 5 refills | Status: DC | PRN

## 2019-10-16 MED ORDER — GABAPENTIN 300 MG PO CAPS: 300.0000 mg | ORAL_CAPSULE | Freq: Three times a day (TID) | ORAL | 5 refills | Status: DC

## 2019-10-16 NOTE — Patient Instructions (Addendum)
Epidural injection:  Degenerative Disk Disease  Degenerative disk disease is a condition caused by changes that occur in the spinal disks as a person ages. Spinal disks are soft and compressible disks located between the bones of your spine (vertebrae). These disks act like shock absorbers. Degenerative disk disease can affect the whole spine. However, the neck and lower back are most often affected. Many changes can occur in the spinal disks with aging, such as:  The spinal disks may dry and shrink.  Small tears may occur in the tough, outer covering of the disk (annulus).  The disk space may become smaller due to loss of water.  Abnormal growths in the bone (spurs) may occur. This can put pressure on the nerve roots exiting the spinal canal, causing pain.  The spinal canal may become narrowed. What are the causes? This condition may be caused by:  Normal degeneration with age.  Injuries.  Certain activities and sports that cause damage. What increases the risk? The following factors may make you more likely to develop this condition:  Being overweight.  Having a family history of degenerative disk disease.  Smoking.  Sudden injury.  Doing work that requires heavy lifting. What are the signs or symptoms? Symptoms of this condition include:  Pain that varies in intensity. Some people have no pain, while others have severe pain. The location of the pain depends on the part of your backbone that is affected. You may have: ? Pain in your neck or arm if a disk in your neck area is affected. ? Pain in your back, buttocks, or legs if a disk in your lower back is affected.  Pain that becomes worse while bending or reaching up, or with twisting movements.  Pain that may start gradually and then get worse as time passes. It may also start after a major or minor injury.  Numbness or tingling in the arms or legs. How is this diagnosed? This condition may be diagnosed based  on:  Your symptoms and medical history.  A physical exam.  Imaging tests, including: ? An X-ray of the spine. ? MRI. How is this treated? This condition may be treated with:  Medicines.  Rehabilitation exercises. These activities aim to strengthen muscles in your back and abdomen to better support your spine. If treatments do not help to relieve your symptoms or you have severe pain, you may need surgery. Follow these instructions at home: Medicines  Take over-the-counter and prescription medicines only as told by your health care provider.  Do not drive or use heavy machinery while taking prescription pain medicine.  If you are taking prescription pain medicine, take actions to prevent or treat constipation. Your health care provider may recommend that you: ? Drink enough fluid to keep your urine pale yellow. ? Eat foods that are high in fiber, such as fresh fruits and vegetables, whole grains, and beans. ? Limit foods that are high in fat and processed sugars, such as fried or sweet foods. ? Take an over-the-counter or prescription medicine for constipation. Activity  Rest as told by your health care provider.  Ask your health care provider what activities are safe for you. Return to your normal activities as directed.  Avoid sitting for a long time without moving. Get up to take short walks every 1-2 hours. This is important to improve blood flow and breathing. Ask for help if you feel weak or unsteady.  Perform relaxation exercises as told by your health care provider.  Maintain good posture.  Do not lift anything that is heavier than 10 lb (4.5 kg), or the limit that you are told, until your health care provider says that it is safe.  Follow proper lifting and walking techniques as told by your health care provider. Managing pain, stiffness, and swelling   If directed, put ice on the painful area. Icing can help to relieve pain. ? Put ice in a plastic bag. ? Place  a towel between your skin and the bag. ? Leave the ice on for 20 minutes, 2-3 times a day.  If directed, apply heat to the painful area as often as told by your health care provider. Heat can reduce the stiffness of your muscles. Use the heat source that your health care provider recommends, such as a moist heat pack or a heating pad. ? Place a towel between your skin and the heat source. ? Leave the heat on for 20-30 minutes. ? Remove the heat if your skin turns bright red. This is especially important if you are unable to feel pain, heat, or cold. You may have a greater risk of getting burned. General instructions  Change your sitting, standing, and sleeping habits as told by your health care provider.  Avoid sitting in the same position for long periods of time. Change positions frequently.  Lose weight or maintain a healthy weight as told by your health care provider.  Do not use any products that contain nicotine or tobacco, such as cigarettes and e-cigarettes. If you need help quitting, ask your health care provider.  Wear supportive footwear.  Keep all follow-up visits as told by your health care provider. This is important. This may include visits for physical therapy. Contact a health care provider if you:  Have pain that does not go away within 1-4 weeks.  Lose your appetite.  Lose weight without trying. Get help right away if you:  Have severe pain.  Notice weakness in your arms, hands, or legs.  Begin to lose control of your bladder or bowel movements.  Have fevers or night sweats. Summary  Degenerative disk disease is a condition caused by changes that occur in the spinal disks as a person ages.  Degenerative disk disease can affect the whole spine. However, the neck and lower back are most often affected.  Take over-the-counter and prescription medicines only as told by your health care provider. This information is not intended to replace advice given to you  by your health care provider. Make sure you discuss any questions you have with your health care provider. Document Revised: 07/06/2017 Document Reviewed: 07/06/2017 Elsevier Patient Education  2020 [employer].  Steps to Quit Smoking Smoking tobacco is the leading cause of preventable death. It can affect almost every organ in the body. Smoking puts you and people around you at risk for many serious, long-lasting (chronic) diseases. Quitting smoking can be hard, but it is one of the best things that you can do for your health. It is never too late to quit. How do I get ready to quit? When you decide to quit smoking, make a plan to help you succeed. Before you quit:  Pick a date to quit. Set a date within the next 2 weeks to give you time to prepare.  Write down the reasons why you are quitting. Keep this list in places where you will see it often.  Tell your family, friends, and co-workers that you are quitting. Their support is important.  Talk  with your doctor about the choices that may help you quit.  Find out if your health insurance will pay for these treatments.  Know the people, places, things, and activities that make you want to smoke (triggers). Avoid them. What first steps can I take to quit smoking?  Throw away all cigarettes at home, at work, and in your car.  Throw away the things that you use when you smoke, such as ashtrays and lighters.  Clean your car. Make sure to empty the ashtray.  Clean your home, including curtains and carpets. What can I do to help me quit smoking? Talk with your doctor about taking medicines and seeing a counselor at the same time. You are more likely to succeed when you do both.  If you are pregnant or breastfeeding, talk with your doctor about counseling or other ways to quit smoking. Do not take medicine to help you quit smoking unless your doctor tells you to do so. To quit smoking: Quit right away  Quit smoking totally, instead of  slowly cutting back on how much you smoke over a period of time.  Go to counseling. You are more likely to quit if you go to counseling sessions regularly. Take medicine You may take medicines to help you quit. Some medicines need a prescription, and some you can buy over-the-counter. Some medicines may contain a drug called nicotine to replace the nicotine in cigarettes. Medicines may:  Help you to stop having the desire to smoke (cravings).  Help to stop the problems that come when you stop smoking (withdrawal symptoms). Your doctor may ask you to use:  Nicotine patches, gum, or lozenges.  Nicotine inhalers or sprays.  Non-nicotine medicine that is taken by mouth. Find resources Find resources and other ways to help you quit smoking and remain smoke-free after you quit. These resources are most helpful when you use them often. They include:  Online chats with a Social worker.  Phone quitlines.  Printed Furniture conservator/restorer.  Support groups or group counseling.  Text messaging programs.  Mobile phone apps. Use apps on your mobile phone or tablet that can help you stick to your quit plan. There are many free apps for mobile phones and tablets as well as websites. Examples include Quit Guide from the [employer] and smokefree.gov  What things can I do to make it easier to quit?   Talk to your family and friends. Ask them to support and encourage you.  Call a phone quitline (1-800-QUIT-NOW), reach out to support groups, or work with a Social worker.  Ask people who smoke to not smoke around you.  Avoid places that make you want to smoke, such as: ? Bars. ? Parties. ? Smoke-break areas at work.  Spend time with people who do not smoke.  Lower the stress in your life. Stress can make you want to smoke. Try these things to help your stress: ? Getting regular exercise. ? Doing deep-breathing exercises. ? Doing yoga. ? Meditating. ? Doing a body scan. To do this, close your eyes, focus on  one area of your body at a time from head to toe. Notice which parts of your body are tense. Try to relax the muscles in those areas. How will I feel when I quit smoking? Day 1 to 3 weeks Within the first 24 hours, you may start to have some problems that come from quitting tobacco. These problems are very bad 2-3 days after you quit, but they do not often last for more  than 2-3 weeks. You may get these symptoms:  Mood swings.  Feeling restless, nervous, angry, or annoyed.  Trouble concentrating.  Dizziness.  Strong desire for high-sugar foods and nicotine.  Weight gain.  Trouble pooping (constipation).  Feeling like you may vomit (nausea).  Coughing or a sore throat.  Changes in how the medicines that you take for other issues work in your body.  Depression.  Trouble sleeping (insomnia). Week 3 and afterward After the first 2-3 weeks of quitting, you may start to notice more positive results, such as:  Better sense of smell and taste.  Less coughing and sore throat.  Slower heart rate.  Lower blood pressure.  Clearer skin.  Better breathing.  Fewer sick days. Quitting smoking can be hard. Do not give up if you fail the first time. Some people need to try a few times before they succeed. Do your best to stick to your quit plan, and talk with your doctor if you have any questions or concerns. Summary  Smoking tobacco is the leading cause of preventable death. Quitting smoking can be hard, but it is one of the best things that you can do for your health.  When you decide to quit smoking, make a plan to help you succeed.  Quit smoking right away, not slowly over a period of time.  When you start quitting, seek help from your doctor, family, or friends. This information is not intended to replace advice given to you by your health care provider. Make sure you discuss any questions you have with your health care provider. Document Revised: 04/05/2019 Document  Reviewed: 09/29/2018 Elsevier Patient Education  [place].  Epidural Steroid Injection  An epidural steroid injection is a shot of steroid medicine and numbing medicine that is given into the space between the spinal cord and the bones of the back (epidural space). The shot helps relieve pain caused by an irritated or swollen nerve root. The amount of pain relief you get from the injection depends on what is causing the nerve to be swollen and irritated, and how long your pain lasts. You are more likely to benefit from this injection if your pain is strong and comes on suddenly rather than if you have had long-term (chronic) pain. Tell a health care provider about:  Any allergies you have.  All medicines you are taking, including vitamins, herbs, eye drops, creams, and over-the-counter medicines.  Any problems you or family members have had with anesthetic medicines.  Any blood disorders you have.  Any surgeries you have had.  Any medical conditions you have.  Whether you are pregnant or may be pregnant. What are the risks? Generally, this is a safe procedure. However, problems may occur, including:  Headache.  Bleeding.  Infection.  Allergic reaction to medicines.  Nerve damage. What happens before the procedure? Staying hydrated Follow instructions from your health care provider about hydration, which may include:  Up to 2 hours before the procedure - you may continue to drink clear liquids, such as water, clear fruit juice, black coffee, and plain tea. Eating and drinking restrictions Follow instructions from your health care provider about eating and drinking, which may include:  8 hours before the procedure - stop eating heavy meals or foods, such as meat, fried foods, or fatty foods.  6 hours before the procedure - stop eating light meals or foods, such as toast or cereal.  6 hours before the procedure - stop drinking milk or drinks that contain  milk.  2 hours before the procedure - stop drinking clear liquids. Medicines  You may be given medicines to lower anxiety.  Ask your health care provider about: ? Changing or stopping your regular medicines. This is especially important if you are taking diabetes medicines or blood thinners. ? Taking medicines such as aspirin and ibuprofen. These medicines can thin your blood. Do not take these medicines unless your health care provider tells you to take them. ? Taking over-the-counter medicines, vitamins, herbs, and supplements.  Ask your health care provider what steps will be taken to prevent infection. General instructions  Plan to have someone take you home from the hospital or clinic.  If you will be going home right after the procedure, plan to have someone with you for 24 hours. What happens during the procedure?  An IV will be inserted into one of your veins.  You will be given one or more of the following: ? A medicine to help you relax (sedative). ? A medicine to numb the area (local anesthetic).  You will be asked to lie on your abdomen or sit.  The injection site will be cleaned.  A needle will be inserted through your skin into the epidural space. This may cause you some discomfort. An X-ray machine will be used to guide the needle as close as possible to the affected nerve.  A steroid medicine and a local anesthetic will be injected into the epidural space.  The needle and IV will be removed.  A bandage (dressing) will be put over the injection site. The procedure may vary among health care providers and hospitals. What can I expect after the procedure? Follow these instructions at home: Injection site care  You may remove the bandage (dressing) after 24 hours.  Check your injection site every day for signs of infection. Check for: ? Redness, swelling, or pain. ? Fluid or blood. ? Warmth. ? Pus or a bad smell. Managing pain, stiffness, and  swelling  For 24 hours after the procedure: ? Avoid using heat on the injection site. ? Do not take baths, swim, or use a hot tub until your health care provider approves. Ask your health care provider if you may take a shower. You may only be allowed to take sponge baths.  If directed, put ice on the injection site. To do this: ? Put ice in a plastic bag. ? Place a towel between your skin and the bag. ? Leave the ice on for 20 minutes, 2-3 times a day.  Activity  Do not drive for 24 hours if you were given a sedative during your procedure.  Return to your normal activities as told by your health care provider. Ask your health care provider what activities are safe for you. General instructions  Your blood pressure, heart rate, breathing rate, and blood oxygen level will be monitored until you leave the hospital or clinic.  Your arm or leg may feel weak or numb for a few hours.  The injection site may feel sore.  Take over-the-counter and prescription medicines only as told by your health care provider.  Drink enough fluid to keep your urine pale yellow.  Keep all follow-up visits as told by your health care provider. This is important. Contact a health care provider if:  You have any of these signs of infection: ? Redness, swelling, or pain around your injection site. ? Fluid or blood coming from your injection site. ? Warmth coming from your injection site. ?  Pus or a bad smell coming from your injection site. ? A fever.  You continue to have pain and soreness around the injection site, even after taking over-the-counter pain medicine.  You have severe, sudden, or lasting nausea or vomiting. Get help right away if:  You have severe pain at the injection site that is not relieved by medicines.  You develop a severe headache or a stiff neck.  You become sensitive to light.  You have any new numbness or weakness in your legs or arms.  You lose control of your bladder  or bowel movements.  You have trouble breathing. Summary  An epidural steroid injection is a shot of steroid medicine and numbing medicine that is given into the epidural space.  The shot helps relieve pain caused by an irritated or swollen nerve root.  You are more likely to benefit from this injection if your pain is strong and comes on suddenly rather than if you have had chronic pain. This information is not intended to replace advice given to you by your health care provider. Make sure you discuss any questions you have with your health care provider. Document Revised: 01/21/2019 Document Reviewed: 01/21/2019 Elsevier Patient Education  [place].

## 2019-10-16 NOTE — Progress Notes (Signed)
Chief Complaint  Patient presents with  . Back Pain  . Hip Pain    right   . Results    review MRI    The patient meets the AMA guidelines for Morbid (severe) obesity with a BMI > 40.0 and I have recommended weight loss.  BP (!) 149/78   Pulse 74   Ht 5\' 4"  (1.626 m)   Wt 238 lb (108 kg)   BMI 40.85 kg/m   This patient has had back and hip pain she has osteoarthritis of the hip she is a chronic smoker she has a BMI not conducive with providing recommendations for surgery she is also having lower back and leg pain   She is continuing to smoke she says is really hard to stop.  She is also having trouble with weight loss because of inactivity lack of exercise and continued use of sun drop sodas  She is in severe pain back right leg radiating down to the foot and then right groin pain and anterior thigh pain   She had an MRI  Disc levels:   Lower thoracic spondylosis.   T12- L1: Spondylosis. Mild disc bulging and ligamentum flavum thickening. No impingement   L1-L2: Mild posterior element hypertrophy.  No impingement   L2-L3: Unremarkable.   L3-L4: Ligamentum flavum thickening and small subchondral cystic change on the right. Minor disc bulging greatest towards the right foramen. No neural compression   L4-L5: Facet degeneration with hypertrophy and small left joint effusion. Mild triangular narrowing of the thecal sac. Moderate left foraminal narrowing.   L5-S1:Unremarkable   IMPRESSION: 1. Degenerative changes most notable at L4-5 where there is facet osteoarthritis with anterolisthesis and disc narrowing. Moderate left foraminal narrowing at this level. 2. Diffusely patent spinal canal.     Electronically Signed   By: Monte Fantasia M.D.   On: 10/14/2019 08:45 After reviewing the MRI I agree the patient has degenerative disc disease from L3-L5   Past Medical History:  Diagnosis Date  . Asthma   . Asymptomatic varicose veins   . Breast cancer ([place])  06/2014   left  . Chronic airway obstruction ([place])   . COPD (chronic obstructive pulmonary disease) ([place])   . Depression   . Hemorrhage rectum    and anus.  . Hypertension   . Insomnia   . Other symptoms involving digestive system(787.99)   . Pain    Hx.pain in joint involving pelvic region and thigh; pain in limb  . Palpitations   . Panic attacks   . Personal history of chemotherapy 2016  . Personal history of radiation therapy 2016  . Sinusitis   . Symptomatic menopausal or female climacteric states    No Known Allergies Review of Systems  Constitutional: Negative for fever, malaise/fatigue and weight loss.  Gastrointestinal: Negative.   Genitourinary: Negative.      Encounter Diagnoses  Name Primary?  . DDD (degenerative disc disease), lumbar Yes  . Primary osteoarthritis of right hip   . Tobacco use disorder   . Body mass index 45.0-49.9, adult ([place])   . Morbid obesity ([place])   . Bilateral leg edema   . Radicular pain of right lower extremity    We discussed her situation and I advised her to stop using the sun drops, get her doctor to give her something for smoking, I told her that if she can get the epidural injections which we recommend for the back and her leg pain goes away and her weight comes  down another 10 pounds should be eligible for total hip replacement  We did add gabapentin and refilled her Ultracet  Meds ordered this encounter  Medications  . gabapentin (NEURONTIN) 300 MG capsule    Sig: Take 1 capsule (300 mg total) by mouth 3 (three) times daily.    Dispense:  90 capsule    Refill:  5  . traMADol-acetaminophen (ULTRACET) 37.5-325 MG tablet    Sig: Take 1 tablet by mouth every 4 (four) hours as needed.    Dispense:  90 tablet    Refill:  5     It seems likely that her symptoms are coming from the L4-5 facet degeneration with hypertrophy as well as some from the disc bulging and right foramen with ligamentum flavum thickening at this  level  Follow-up in 1 follow-up in 4 weeks to check on her epidural injections 4 wk fu

## 2019-10-21 ENCOUNTER — Telehealth: Payer: Self-pay | Admitting: Radiology

## 2019-10-21 NOTE — Telephone Encounter (Signed)
Regina Mcmahon is calling back to schedule her appt for an injection. Please call her back at [phone]

## 2019-10-21 NOTE — Telephone Encounter (Signed)
Pt is scheduled for 11/13/19 with driver and no BTs.

## 2019-11-13 ENCOUNTER — Ambulatory Visit: Payer: Self-pay

## 2019-11-13 ENCOUNTER — Encounter: Payer: Self-pay | Admitting: Physical Medicine and Rehabilitation

## 2019-11-13 ENCOUNTER — Other Ambulatory Visit: Payer: Self-pay

## 2019-11-13 ENCOUNTER — Ambulatory Visit: Payer: Medicare HMO | Admitting: Physical Medicine and Rehabilitation

## 2019-11-13 VITALS — BP 139/80 | HR 74

## 2019-11-13 DIAGNOSIS — M5416 Radiculopathy, lumbar region: Secondary | ICD-10-CM

## 2019-11-13 MED ORDER — METHYLPREDNISOLONE ACETATE 80 MG/ML IJ SUSP
40.0000 mg | Freq: Once | INTRAMUSCULAR | Status: AC
  Administered 2019-11-13: 40 mg

## 2019-11-13 NOTE — Progress Notes (Addendum)
Regina Mcmahon - 69 y.o. female MRN [id number]  Date of birth: [birth year]  Office Visit Note: Visit Date: 11/13/2019 PCP: [name], MD Referred by: [name], MD  Subjective: Chief Complaint  Patient presents with  . Lower Back - Pain  . Right Leg - Pain   HPI: Regina Mcmahon is a 69 y.o. female who comes in today For planned right L4-5 interlaminar dural steroid injection.  She is followed by Dr. Arther Abbott.  She has moderate severe arthritis of the right hip.  Prior right intra-articular hip injection was very successful during the anesthetic phase but then she had increasing pain afterwards.  She ultimately did get MRI of her lumbar spine showing a listhesis of L4 on L5 with facet arthropathy and some lateral recess narrowing but no real high-grade central stenosis or herniated disc.  We are in a trial injection diagnostically and hopefully therapeutically today.  The patient has failed conservative care including home exercise, medications, time and activity modification.  This injection will be diagnostic and hopefully therapeutic.  Please see requesting physician notes for further details and justification. Depending on relief would consider repeat hip injection one time.  ROS Otherwise per HPI.  Assessment & Plan: Visit Diagnoses:  1. Lumbar radiculopathy     Plan: No additional findings.   Meds & Orders:  Meds ordered this encounter  Medications  . methylPREDNISolone acetate (DEPO-MEDROL) injection 40 mg    Orders Placed This Encounter  Procedures  . XR C-ARM NO REPORT  . Epidural Steroid injection    Follow-up: Return if symptoms worsen or fail to improve.   Procedures: No procedures performed  Lumbar Epidural Steroid Injection - Interlaminar Approach with Fluoroscopic Guidance  Patient: Regina Mcmahon      Date of Birth: 1950/11/25 MRN: [id number] PCP: [name], MD      Visit Date: 11/13/2019   Universal Protocol:     Consent Given By: the  patient  Position: PRONE  Additional Comments: Vital signs were monitored before and after the procedure. Patient was prepped and draped in the usual sterile fashion. The correct patient, procedure, and site was verified.   Injection Procedure Details:  Procedure Site One Meds Administered:  Meds ordered this encounter  Medications  . methylPREDNISolone acetate (DEPO-MEDROL) injection 40 mg     Laterality: Right  Location/Site:  L4-L5  Needle size: 20 G  Needle type: Tuohy  Needle Placement: Paramedian epidural  Findings:   -Comments: Excellent flow of contrast into the epidural space.  Procedure Details: Using a paramedian approach from the side mentioned above, the region overlying the inferior lamina was localized under fluoroscopic visualization and the soft tissues overlying this structure were infiltrated with 4 ml. of 1% Lidocaine without Epinephrine. The Tuohy needle was inserted into the epidural space using a paramedian approach.   The epidural space was localized using loss of resistance along with lateral and bi-planar fluoroscopic views.  After negative aspirate for air, blood, and CSF, a 2 ml. volume of Isovue-250 was injected into the epidural space and the flow of contrast was observed. Radiographs were obtained for documentation purposes.    The injectate was administered into the level noted above.   Additional Comments:  The patient tolerated the procedure well Dressing: 2 x 2 sterile gauze and Band-Aid    Post-procedure details: Patient was observed during the procedure. Post-procedure instructions were reviewed.  Patient left the clinic in stable condition.     Clinical  History: MRI LUMBAR SPINE WITHOUT CONTRAST    TECHNIQUE:  Multiplanar, multisequence MR imaging of the lumbar spine was  performed. No intravenous contrast was administered.    COMPARISON: None.    FINDINGS:  Segmentation: Standard lumbar numbering     Alignment: Grade 1 anterolisthesis at L4-5, facet mediated    Vertebrae: No fracture, evidence of discitis, or bone lesion.    Conus medullaris and cauda equina: Conus extends to the L1-2 level.  Conus and cauda equina appear normal.    Paraspinal and other soft tissues: Negative    Disc levels:    Lower thoracic spondylosis.    T12- L1: Spondylosis. Mild disc bulging and ligamentum flavum  thickening. No impingement    L1-L2: Mild posterior element hypertrophy. No impingement    L2-L3: Unremarkable.    L3-L4: Ligamentum flavum thickening and small subchondral cystic  change on the right. Minor disc bulging greatest towards the right  foramen. No neural compression    L4-L5: Facet degeneration with hypertrophy and small left joint  effusion. Mild triangular narrowing of the thecal sac. Moderate left  foraminal narrowing.    L5-S1:Unremarkable    IMPRESSION:  1. Degenerative changes most notable at L4-5 where there is facet  osteoarthritis with anterolisthesis and disc narrowing. Moderate  left foraminal narrowing at this level.  2. Diffusely patent spinal canal.      Electronically Signed  By: Monte Fantasia M.D.  On: 10/14/2019 08:45 -- EXAM: DG HIP (WITH OR WITHOUT PELVIS) 5+V BILAT  COMPARISON:  None.  FINDINGS: Moderate to severe degenerative change in the right hip joint with joint space narrowing and spurring.  Left hip joint normal.  Negative for fracture or mass.  IMPRESSION: Moderate to advanced degenerative change right hip   Electronically Signed   By: Franchot Gallo M.D.   On: 12/20/2018 13:57   She reports that she has been smoking cigarettes. She has been smoking about 1.00 pack per day. She has never used smokeless tobacco. No results for input(s): HGBA1C, LABURIC in the last 8760 hours.  Objective:  VS:  HT:    WT:   BMI:     BP:139/80  HR:74bpm  TEMP: ( )  RESP:  Physical Exam Vitals and nursing  note reviewed.  Constitutional:      General: She is not in acute distress.    Appearance: Normal appearance. She is well-developed. She is obese. She is not ill-appearing.  HENT:     Head: Normocephalic and atraumatic.  Eyes:     Conjunctiva/sclera: Conjunctivae normal.     Pupils: Pupils are equal, round, and reactive to light.  Cardiovascular:     Rate and Rhythm: Normal rate.     Pulses: Normal pulses.  Pulmonary:     Effort: Pulmonary effort is normal.  Musculoskeletal:     Right lower leg: No edema.     Left lower leg: No edema.     Comments: Antalgic gait to the right with painful range of motion of the right hip using a cane.  Good distal strength.  Skin:    General: Skin is warm and dry.     Findings: No erythema or rash.  Neurological:     General: No focal deficit present.     Mental Status: She is alert and oriented to person, place, and time.     Sensory: No sensory deficit.     Motor: No abnormal muscle tone.     Coordination: Coordination normal.     Gait:  Gait normal.  Psychiatric:        Mood and Affect: Mood normal.        Behavior: Behavior normal.     Ortho Exam  Imaging: Epidural Steroid injection  Result Date: 11/13/2019 Magnus Sinning, MD     11/13/2019  2:56 PM Lumbar Epidural Steroid Injection - Interlaminar Approach with Fluoroscopic Guidance Patient: DEANDRIA KLUTE     Date of Birth: August 22, 1950 MRN: [id number] PCP: [name], MD     Visit Date: 11/13/2019  Universal Protocol:   Consent Given By: the patient Position: PRONE Additional Comments: Vital signs were monitored before and after the procedure. Patient was prepped and draped in the usual sterile fashion. The correct patient, procedure, and site was verified. Injection Procedure Details: Procedure Site One Meds Administered: Meds ordered this encounter Medications . methylPREDNISolone acetate (DEPO-MEDROL) injection 40 mg  Laterality: Right Location/Site: L4-L5 Needle size: 20 G Needle type:  Tuohy Needle Placement: Paramedian epidural Findings:  -Comments: Excellent flow of contrast into the epidural space. Procedure Details: Using a paramedian approach from the side mentioned above, the region overlying the inferior lamina was localized under fluoroscopic visualization and the soft tissues overlying this structure were infiltrated with 4 ml. of 1% Lidocaine without Epinephrine. The Tuohy needle was inserted into the epidural space using a paramedian approach. The epidural space was localized using loss of resistance along with lateral and bi-planar fluoroscopic views.  After negative aspirate for air, blood, and CSF, a 2 ml. volume of Isovue-250 was injected into the epidural space and the flow of contrast was observed. Radiographs were obtained for documentation purposes.  The injectate was administered into the level noted above. Additional Comments: The patient tolerated the procedure well Dressing: 2 x 2 sterile gauze and Band-Aid  Post-procedure details: Patient was observed during the procedure. Post-procedure instructions were reviewed. Patient left the clinic in stable condition.    Past Medical/Family/Surgical/Social History: Medications & Allergies reviewed per EMR, new medications updated. Patient Active Problem List   Diagnosis Date Noted  . Shortness of breath 09/12/2016  . Well woman exam with routine gynecological exam 04/20/2016  . Morbid obesity ([place]) 03/03/2016  . Colon adenomas 03/03/2016  . Rash 01/18/2016  . GERD (gastroesophageal reflux disease) 10/19/2015  . Osteopenia determined by x-ray 09/07/2015  . High risk medication use 09/07/2015  . Genetic testing 03/20/2015  . Family history of ovarian cancer 03/20/2015  . Family history of colon cancer 03/20/2015  . Family history of cancer 03/20/2015  . 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 Anxiety 10/29/2012  . Blood in stool 10/29/2012  . Pain 10/29/2012  . Chronic airway  obstruction ([place]) 10/29/2012  . Diarrhea 10/29/2012  . Tobacco use disorder 10/29/2012  . Essential hypertension 10/29/2012   Past Medical History:  Diagnosis Date  . Asthma   . Asymptomatic varicose veins   . Breast cancer ([place]) 06/2014   left  . Chronic airway obstruction ([place])   . COPD (chronic obstructive pulmonary disease) ([place])   . Depression   . Hemorrhage rectum    and anus.  . Hypertension   . Insomnia   . Other symptoms involving digestive system(787.99)   . Pain    Hx.pain in joint involving pelvic region and thigh; pain in limb  . Palpitations   . Panic attacks   . Personal history of chemotherapy 2016  . Personal history of radiation therapy 2016  . Sinusitis   . Symptomatic menopausal or  female climacteric states    Family History  Problem Relation Age of Onset  . Diabetes Mother   . Ovarian cancer Mother 47  . Other Father        died in MVA at age 67  . Other Sister        mitral valve disorder  . Melanoma Sister 54       removed from back of leg  . Colon cancer Brother        early 39s, succumbed to disease  . Cancer Paternal Aunt        leukemia, bone, or lung cancer  . Alzheimer's disease Maternal Grandmother   . Heart attack Maternal Grandfather   . Heart attack Paternal Grandmother   . COPD Paternal Grandfather   . Emphysema Paternal Grandfather   . Congestive Heart Failure Paternal Aunt   . Stomach cancer Paternal Uncle   . Kidney cancer Maternal Uncle   . Cancer Cousin        dx. teens/dx. 40s  . Cancer Cousin   . Colon cancer Cousin    Past Surgical History:  Procedure Laterality Date  . BREAST BIOPSY Left 10/2012  . BREAST BIOPSY Left 07/01/2014   malignant  . BREAST LUMPECTOMY Left 08/20/2014  . CESAREAN SECTION    . CHOLECYSTECTOMY  1985  . COLONOSCOPY  2002   Dr. Lindalou Hose: internal hemorrhoids, one small rectal polyp, adenomatous path   . COLONOSCOPY N/A 11/23/2015   Procedure: COLONOSCOPY;  Surgeon: Danie Binder, MD;   [facility];  Service: Endoscopy;  Laterality: N/A;  1100 - moved to 10:45 - office to notify  . ESOPHAGOGASTRODUODENOSCOPY N/A 11/23/2015   Procedure: ESOPHAGOGASTRODUODENOSCOPY (EGD);  Surgeon: Danie Binder, MD;  [facility];  Service: Endoscopy;  Laterality: N/A;  . PORT-A-CATH REMOVAL  02/2015  . PORTACATH PLACEMENT Right 09/17/2014   Procedure: INSERTION PORT-A-CATH WITH ULTRASOUND;  Surgeon: Erroll Luna, MD;  Location: [facility];  Service: General;  Laterality: Right;  IJ  . RADIOACTIVE SEED GUIDED PARTIAL MASTECTOMY WITH AXILLARY SENTINEL LYMPH NODE BIOPSY Left 08/20/2014   Procedure: LEFT BREAST LUMPECTOMY WITH RADIOACTIVE SEED LOCALIZATION AND SENTINEL LYMPH NODE MAPPING;  Surgeon: Erroll Luna, MD;  Location: [facility];  Service: General;  Laterality: Left;  . RE-EXCISION OF BREAST LUMPECTOMY Left 09/17/2014   Procedure: RE-EXCISION OF BREAST LUMPECTOMY;  Surgeon: Erroll Luna, MD;  Location: [facility];  Service: General;  Laterality: Left;  . TUBAL LIGATION     Social History   Occupational History  . Not on file  Tobacco Use  . Smoking status: Current Every Day Smoker    Packs/day: 1.00    Types: Cigarettes    Last attempt to quit: 02/03/2016    Years since quitting: 3.7  . Smokeless tobacco: Never Used  Substance and Sexual Activity  . Alcohol use: No    Alcohol/week: 0.0 standard drinks  . Drug use: No  . Sexual activity: Never    Birth control/protection: None

## 2019-11-13 NOTE — Procedures (Signed)
Lumbar Epidural Steroid Injection - Interlaminar Approach with Fluoroscopic Guidance  Patient: Regina Mcmahon      Date of Birth: 1950-12-03 MRN: [id number] PCP: [name], MD      Visit Date: 11/13/2019   Universal Protocol:     Consent Given By: the patient  Position: PRONE  Additional Comments: Vital signs were monitored before and after the procedure. Patient was prepped and draped in the usual sterile fashion. The correct patient, procedure, and site was verified.   Injection Procedure Details:  Procedure Site One Meds Administered:  Meds ordered this encounter  Medications  . methylPREDNISolone acetate (DEPO-MEDROL) injection 40 mg     Laterality: Right  Location/Site:  L4-L5  Needle size: 20 G  Needle type: Tuohy  Needle Placement: Paramedian epidural  Findings:   -Comments: Excellent flow of contrast into the epidural space.  Procedure Details: Using a paramedian approach from the side mentioned above, the region overlying the inferior lamina was localized under fluoroscopic visualization and the soft tissues overlying this structure were infiltrated with 4 ml. of 1% Lidocaine without Epinephrine. The Tuohy needle was inserted into the epidural space using a paramedian approach.   The epidural space was localized using loss of resistance along with lateral and bi-planar fluoroscopic views.  After negative aspirate for air, blood, and CSF, a 2 ml. volume of Isovue-250 was injected into the epidural space and the flow of contrast was observed. Radiographs were obtained for documentation purposes.    The injectate was administered into the level noted above.   Additional Comments:  The patient tolerated the procedure well Dressing: 2 x 2 sterile gauze and Band-Aid    Post-procedure details: Patient was observed during the procedure. Post-procedure instructions were reviewed.  Patient left the clinic in stable condition.

## 2019-11-13 NOTE — Progress Notes (Signed)
Pt states pain in the lower back and radiates into the right leg all the way down. Pt states pain started a couple of years ago. Pt states walking makes pain worse. Ibuprofen helps with pain.   .Numeric Pain Rating Scale and Functional Assessment Average Pain 8   In the last MONTH (on 0-10 scale) has pain interfered with the following?  1. General activity like being  able to carry out your everyday physical activities such as walking, climbing stairs, carrying groceries, or moving a chair?  Rating(8)   +Driver, -BT, -Dye Allergies.

## 2019-11-15 ENCOUNTER — Other Ambulatory Visit: Payer: Self-pay

## 2019-11-15 ENCOUNTER — Ambulatory Visit (INDEPENDENT_AMBULATORY_CARE_PROVIDER_SITE_OTHER): Payer: Medicare HMO | Admitting: Orthopedic Surgery

## 2019-11-15 ENCOUNTER — Encounter: Payer: Self-pay | Admitting: Orthopedic Surgery

## 2019-11-15 VITALS — BP 141/77 | HR 67 | Wt 241.0 lb

## 2019-11-15 DIAGNOSIS — M5136 Other intervertebral disc degeneration, lumbar region: Secondary | ICD-10-CM

## 2019-11-15 DIAGNOSIS — Z6841 Body Mass Index (BMI) 40.0 and over, adult: Secondary | ICD-10-CM

## 2019-11-15 DIAGNOSIS — M1611 Unilateral primary osteoarthritis, right hip: Secondary | ICD-10-CM

## 2019-11-15 DIAGNOSIS — F17200 Nicotine dependence, unspecified, uncomplicated: Secondary | ICD-10-CM | POA: Diagnosis not present

## 2019-11-15 MED ORDER — ACETAMINOPHEN-CODEINE #3 300-30 MG PO TABS: 1.0000 | ORAL_TABLET | Freq: Four times a day (QID) | ORAL | 0 refills | Status: DC | PRN

## 2019-11-15 NOTE — Progress Notes (Signed)
Chief Complaint  Patient presents with  . Back Pain    ESI 2 days ago helped some   . Hip Pain    right     Encounter Diagnoses  Name Primary?  . DDD (degenerative disc disease), lumbar Yes  . Primary osteoarthritis of right hip   . Tobacco use disorder   . Body mass index 45.0-49.9, adult ([place])   . Morbid obesity [facility])     Chief Complaint  Patient presents with  . Back Pain  . Hip Pain      right   . Results      review MRI     The patient meets the AMA guidelines for Morbid (severe) obesity with a BMI > 40.0 and I have recommended weight loss.   BP (!) 149/78   Pulse 74   Ht 5\' 4"  (1.626 m)   Wt 238 lb (108 kg)   BMI 40.85 kg/m    This patient has had back and hip pain she has osteoarthritis of the hip she is a chronic smoker she has a BMI not conducive with providing recommendations for surgery she is also having lower back and leg pain     She is continuing to smoke she says is really hard to stop.  She is also having trouble with weight loss because of inactivity lack of exercise and continued use of sun drop sodas   She is in severe pain back right leg radiating down to the foot and then right groin pain and anterior thigh pain     She had an MRI   Lumbar Epidural Steroid Injection - Interlaminar Approach with Fluoroscopic Guidance   Patient: Regina Mcmahon                                                        Date of Birth: 16-Jun-1951 MRN: [id number] PCP: [name], MD                                                            Visit Date: 11/13/2019   ______________________________________________________________________________________________________________________________________________  Regina Mcmahon is here after her first ESI injection 2 days ago she is cut her smoking from 2 packs to 1-1/2 packs/day she is in a cessation group her BMI still 41 she got good relief of back pain still has the radicular pain and of course the groin pain 1 from the back  one from the hip arthritis He comes in today using her cane  She has tenderness still in her back and buttocks has painful range of motion of the hip and the right groin  Recommend taking her gabapentin at night as the first Neurontin that we gave her caused her to be too sleepy so she had a side effect of drowsiness  Would like her to switch to Tylenol 3 from Ultracet as the Ultracet is not controlling her pain she will continue her ibuprofen   Meds ordered this encounter  Medications  . DISCONTD: acetaminophen-codeine (TYLENOL #3) 300-30 MG tablet    Sig: Take 1 tablet by mouth every 6 (six) hours as needed for up  to 5 days for moderate pain.    Dispense:  20 tablet    Refill:  0  . acetaminophen-codeine (TYLENOL #3) 300-30 MG tablet    Sig: Take 1 tablet by mouth every 6 (six) hours as needed for moderate pain.    Dispense:  30 tablet    Refill:  0     Body mass index is 41.37 kg/m.   Encounter Diagnoses  Name Primary?  . DDD (degenerative disc disease), lumbar Yes  . Primary osteoarthritis of right hip   . Tobacco use disorder   . Body mass index 45.0-49.9, adult ([place])   . Morbid obesity ([place])

## 2019-11-15 NOTE — Patient Instructions (Signed)
stop tramadol  Take gabapentin at night   Weight loss  And smoking cessation   Start tylenol and codeine

## 2019-11-26 ENCOUNTER — Ambulatory Visit: Payer: Medicare HMO | Admitting: Cardiovascular Disease

## 2019-11-26 ENCOUNTER — Encounter: Payer: Self-pay | Admitting: Cardiovascular Disease

## 2019-11-26 ENCOUNTER — Other Ambulatory Visit: Payer: Self-pay

## 2019-11-26 VITALS — BP 136/78 | HR 76 | Temp 96.3°F | Ht 64.0 in | Wt 237.0 lb

## 2019-11-26 DIAGNOSIS — I872 Venous insufficiency (chronic) (peripheral): Secondary | ICD-10-CM | POA: Diagnosis not present

## 2019-11-26 DIAGNOSIS — Z79899 Other long term (current) drug therapy: Secondary | ICD-10-CM

## 2019-11-26 DIAGNOSIS — R600 Localized edema: Secondary | ICD-10-CM

## 2019-11-26 DIAGNOSIS — I83893 Varicose veins of bilateral lower extremities with other complications: Secondary | ICD-10-CM | POA: Diagnosis not present

## 2019-11-26 MED ORDER — FUROSEMIDE 20 MG PO TABS: ORAL_TABLET | ORAL | 3 refills | Status: DC

## 2019-11-26 MED ORDER — POTASSIUM CHLORIDE CRYS ER 20 MEQ PO TBCR
20.0000 meq | EXTENDED_RELEASE_TABLET | Freq: Every day | ORAL | 3 refills | Status: DC
Start: 2019-11-26 — End: 2019-12-02

## 2019-11-26 NOTE — Progress Notes (Signed)
SUBJECTIVE: The patient is a 69 year old woman with a history of venous varicosities whom I saw once before on 05/07/2018.  She has a past medical history of stage IIb invasive ductal carcinoma initially diagnosed in December 2015.  She has undergone lumpectomy, chemotherapy, and radiation therapy. She also has a history of obesity, tobacco abuse, and COPD.  She is noted to have extensive lower extremity venous varicosities.  Lower extremity Dopplers on 12/20/2018 did not demonstrate any significant left lower extremity DVT.  She previously told me that she has had varicose veins in both legs since at least her 41s.  I referred her to vascular surgery to see if she was a candidate for venous ablation at that time.  I reviewed office notes from her PCP.  Labs on 10/14/2019 showed hemoglobin 17.1, platelets 214, BUN 12, creatinine 0.81, sodium 142, potassium 3.6, total cholesterol 134, triglycerides 106, HDL 43, LDL 71, hemoglobin A1c 5.7%, TSH 1.18.  PCP notes indicate bilateral leg swelling.  She was prescribed Lasix for 2 weeks and her legs were still swollen.  She is referred today to rule out heart failure as a possible etiology.  She took Lasix 20 mg daily.  She has bilateral leg swelling and says this is the worst they have ever been.  They have progressed in the last year and a half.  She has been walking with a cane and needs right hip surgery.  She has occasional chest twinges.  Chronic exertional dyspnea from COPD is stable.  She plans to quit smoking.    Review of Systems: As per "subjective", otherwise negative.  No Known Allergies  Current Outpatient Medications  Medication Sig Dispense Refill  . acetaminophen-codeine (TYLENOL #3) 300-30 MG tablet Take 1 tablet by mouth every 6 (six) hours as needed for moderate pain. 30 tablet 0  . albuterol (PROVENTIL HFA;VENTOLIN HFA) 108 (90 BASE) MCG/ACT inhaler Inhale 2 puffs into the lungs every 6 (six) hours as  needed for wheezing or shortness of breath.    . ALPRAZolam (XANAX) 0.5 MG tablet Take 0.5 mg by mouth 3 (three) times daily as needed for anxiety.    Jearl Klinefelter ELLIPTA 62.5-25 MCG/INH AEPB 1 puff daily.    . calcium carbonate (TUMS - DOSED IN MG ELEMENTAL CALCIUM) 500 MG chewable tablet Chew 1 tablet by mouth 3 (three) times daily with meals. Reported on 12/07/2015    . furosemide (LASIX) 20 MG tablet Take 20 mg by mouth daily.    Marland Kitchen gabapentin (NEURONTIN) 300 MG capsule Take 1 capsule (300 mg total) by mouth 3 (three) times daily. 90 capsule 5  . hydrochlorothiazide (MICROZIDE) 12.5 MG capsule Take 12.5 mg by mouth daily. Reported on 12/07/2015    . ibuprofen (ADVIL) 800 MG tablet Take 800 mg by mouth 3 (three) times daily.    . pantoprazole (PROTONIX) 40 MG tablet Take 1 tablet by mouth once daily 90 tablet 1  . sertraline (ZOLOFT) 100 MG tablet Take 100 mg by mouth daily.    . tamoxifen (NOLVADEX) 20 MG tablet Take 1 tablet (20 mg total) by mouth daily. 30 tablet 3  . traMADol-acetaminophen (ULTRACET) 37.5-325 MG tablet Take 1 tablet by mouth every 4 (four) hours as needed. 90 tablet 5   No current facility-administered medications for this visit.    Past Medical History:  Diagnosis Date  . Asthma   . Asymptomatic varicose veins   . Breast cancer ([place]) 06/2014   left  . Chronic airway  obstruction ([place])   . COPD (chronic obstructive pulmonary disease) ([place])   . Depression   . Hemorrhage rectum    and anus.  . Hypertension   . Insomnia   . Other symptoms involving digestive system(787.99)   . Pain    Hx.pain in joint involving pelvic region and thigh; pain in limb  . Palpitations   . Panic attacks   . Personal history of chemotherapy 2016  . Personal history of radiation therapy 2016  . Sinusitis   . Symptomatic menopausal or female climacteric states     Past Surgical History:  Procedure Laterality Date  . BREAST BIOPSY Left 10/2012  . BREAST BIOPSY Left 07/01/2014    malignant  . BREAST LUMPECTOMY Left 08/20/2014  . CESAREAN SECTION    . CHOLECYSTECTOMY  1985  . COLONOSCOPY  2002   Dr. Lindalou Hose: internal hemorrhoids, one small rectal polyp, adenomatous path   . COLONOSCOPY N/A 11/23/2015   Procedure: COLONOSCOPY;  Surgeon: Danie Binder, MD;  [facility];  Service: Endoscopy;  Laterality: N/A;  1100 - moved to 10:45 - office to notify  . ESOPHAGOGASTRODUODENOSCOPY N/A 11/23/2015   Procedure: ESOPHAGOGASTRODUODENOSCOPY (EGD);  Surgeon: Danie Binder, MD;  [facility]: [facility];  Service: Endoscopy;  Laterality: N/A;  . PORT-A-CATH REMOVAL  02/2015  . PORTACATH PLACEMENT Right 09/17/2014   Procedure: INSERTION PORT-A-CATH WITH ULTRASOUND;  Surgeon: Erroll Luna, MD;  Location: [place];  Service: General;  Laterality: Right;  IJ  . RADIOACTIVE SEED GUIDED PARTIAL MASTECTOMY WITH AXILLARY SENTINEL LYMPH NODE BIOPSY Left 08/20/2014   Procedure: LEFT BREAST LUMPECTOMY WITH RADIOACTIVE SEED LOCALIZATION AND SENTINEL LYMPH NODE MAPPING;  Surgeon: Erroll Luna, MD;  Location: [facility];  Service: General;  Laterality: Left;  . RE-EXCISION OF BREAST LUMPECTOMY Left 09/17/2014   Procedure: RE-EXCISION OF BREAST LUMPECTOMY;  Surgeon: Erroll Luna, MD;  Location: [place];  Service: General;  Laterality: Left;  . TUBAL LIGATION      Social History   Socioeconomic History  . Marital status: Divorced    Spouse name: Not on file  . Number of children: 2  . Years of education: Not on file  . Highest education level: Not on file  Occupational History  . Not on file  Tobacco Use  . Smoking status: Current Every Day Smoker    Packs/day: 1.00    Types: Cigarettes    Last attempt to quit: 02/03/2016    Years since quitting: 3.8  . Smokeless tobacco: Never Used  Substance and Sexual Activity  . Alcohol use: No    Alcohol/week: 0.0 standard drinks  . Drug use: No  . Sexual activity: Never    Birth  control/protection: None  Other Topics Concern  . Not on file  Social History Narrative  . Not on file   Social Determinants of Health   Financial Resource Strain:   . Difficulty of Paying Living Expenses:   Food Insecurity:   . Worried About Charity fundraiser in the Last Year:   . Arboriculturist in the Last Year:   Transportation Needs:   . Film/video editor (Medical):   Marland Kitchen Lack of Transportation (Non-Medical):   Physical Activity:   . Days of Exercise per Week:   . Minutes of Exercise per Session:   Stress:   . Feeling of Stress :   Social Connections:   . Frequency of Communication with Friends and Family:   .  Frequency of Social Gatherings with Friends and Family:   . Attends Religious Services:   . Active Member of Clubs or Organizations:   . Attends Archivist Meetings:   Marland Kitchen Marital Status:   Intimate Partner Violence:   . Fear of Current or Ex-Partner:   . Emotionally Abused:   Marland Kitchen Physically Abused:   . Sexually Abused:     Barbarann Ehlers, RN was present throughout the entirety of the encounter.  Vitals:   11/26/19 1539  BP: 136/78  Pulse: 76  Temp: (!) 96.3 F (35.7 C)  SpO2: 93%  Weight: 237 lb (107.5 kg)  Height: 5\' 4"  (1.626 m)    Wt Readings from Last 3 Encounters:  11/26/19 237 lb (107.5 kg)  11/15/19 241 lb (109.3 kg)  10/16/19 238 lb (108 kg)     PHYSICAL EXAM General: Obese female in NAD HEENT: Normal. Neck: No JVD, no thyromegaly. Lungs: Clear to auscultation bilaterally with normal respiratory effort. CV: Regular rate and rhythm, normal S1/S2, no S3/S4, no murmur.  2+ pitting bilateral leg edema with extensive venous insufficiency seen bilaterally. Abdomen: Soft, nontender, obese.  Neurologic: Alert and oriented.  Psych: Normal affect. Skin: Normal. Musculoskeletal: No gross deformities.      Labs: Lab Results  Component Value Date/Time   K 3.3 (L) 08/07/2019 02:34 PM   BUN 14 08/07/2019 02:34 PM    CREATININE 0.68 08/07/2019 02:34 PM   ALT 17 08/07/2019 02:34 PM   HGB 16.7 (H) 08/07/2019 02:34 PM     Lipids: No results found for: LDLCALC, LDLDIRECT, CHOL, TRIG, HDL     ASSESSMENT AND PLAN:  1.  Bilateral leg edema: Likely due to venous insufficiency.  In order to rule out a cardiac etiology, I will obtain an echocardiogram to evaluate cardiac structure and function.  I previously made a referral to vascular surgery to see if she is a candidate for venous ablation.  I will make another referral as she did not go to the last appointment as that was the beginning of the COVID-19 pandemic. She has been taking Lasix 20 mg daily.  I will try increasing to 40 mg twice daily and have her take supplemental potassium chloride 20 mEq daily.  I will check a basic metabolic panel within the next few days.  I would not rule out the possibility of torsemide if need be.   Disposition: Follow up 6 weeks virtual visit  Time spent: 40 minutes, of which greater than 50% was spent reviewing symptoms, relevant blood tests and studies, and discussing management plan with the patient.    Kate Sable, M.D., F.A.C.C.

## 2019-11-26 NOTE — Patient Instructions (Signed)
Medication Instructions: Take Lasix 40 mg twice a day for 1 week, THEN go back to 40 mg daily  Take Potassium 20 meq daily   Labwork: BMET on Friday 11/29/19  Procedures/Testing: Your physician has requested that you have an echocardiogram. Echocardiography is a painless test that uses sound waves to create images of your heart. It provides your doctor with information about the size and shape of your heart and how well your heart's chambers and valves are working. This procedure takes approximately one hour. There are no restrictions for this procedure.    Follow-Up: 6 weeks telephone visit with Dr.Koneswaran  Any Additional Special Instructions Will Be Listed Below (If Applicable).  [facility].They will call you to make an apt.  If you need a refill on your cardiac medications before your next appointment, please call your pharmacy.

## 2019-11-29 ENCOUNTER — Other Ambulatory Visit (HOSPITAL_COMMUNITY)
Admission: RE | Admit: 2019-11-29 | Discharge: 2019-11-29 | Disposition: A | Discharge status: Home / Self Care | Payer: Medicare HMO | Source: Ambulatory Visit | Attending: Cardiovascular Disease | Admitting: Cardiovascular Disease

## 2019-11-29 ENCOUNTER — Other Ambulatory Visit: Payer: Self-pay

## 2019-11-29 DIAGNOSIS — Z79899 Other long term (current) drug therapy: Secondary | ICD-10-CM | POA: Insufficient documentation

## 2019-11-29 LAB — BASIC METABOLIC PANEL
Anion gap: 10 (ref 5–15)
BUN: 15 mg/dL (ref 8–23)
CO2: 28 mmol/L (ref 22–32)
Calcium: 9.2 mg/dL (ref 8.9–10.3)
Chloride: 101 mmol/L (ref 98–111)
Creatinine, Ser: 0.82 mg/dL (ref 0.44–1.00)
GFR calc Af Amer: >60 mL/min (ref >60)
GFR calc non Af Amer: >60 mL/min (ref >60)
Glucose, Bld: 117 mg/dL — ABNORMAL HIGH (ref 70–99)
Potassium: 3.2 mmol/L — ABNORMAL LOW (ref 3.5–5.1)
Sodium: 139 mmol/L (ref 135–145)

## 2019-12-02 ENCOUNTER — Telehealth: Payer: Self-pay

## 2019-12-02 DIAGNOSIS — Z79899 Other long term (current) drug therapy: Secondary | ICD-10-CM

## 2019-12-02 MED ORDER — POTASSIUM CHLORIDE CRYS ER 20 MEQ PO TBCR
20.0000 meq | EXTENDED_RELEASE_TABLET | Freq: Two times a day (BID) | ORAL | 3 refills | Status: DC
Start: 2019-12-02 — End: 2020-10-12

## 2019-12-02 NOTE — Telephone Encounter (Signed)
-----   Message from Herminio Commons, MD sent at 12/02/2019  9:00 AM EDT ----- K is low. Increase KCl to 40 meq bid x 3 days, then reduce to 20 meq bid going forward. Repeat BMET in one week.

## 2019-12-02 NOTE — Telephone Encounter (Signed)
Pt will follow instructions for additional potassium and stop by office to get lab slips for bmet in 1 week

## 2019-12-02 NOTE — Telephone Encounter (Signed)
Pt called asking if she missed a call Re:lab work  Please call [phone]  Thanks renee

## 2019-12-02 NOTE — Telephone Encounter (Signed)
I spoke with pt, lab results and med change given

## 2019-12-04 ENCOUNTER — Other Ambulatory Visit: Payer: Self-pay

## 2019-12-04 ENCOUNTER — Ambulatory Visit (HOSPITAL_COMMUNITY)
Admission: RE | Admit: 2019-12-04 | Discharge: 2019-12-04 | Disposition: A | Discharge status: Home / Self Care | Payer: Medicare HMO | Source: Ambulatory Visit | Attending: Cardiovascular Disease | Admitting: Cardiovascular Disease

## 2019-12-04 DIAGNOSIS — I83893 Varicose veins of bilateral lower extremities with other complications: Secondary | ICD-10-CM | POA: Diagnosis not present

## 2019-12-04 DIAGNOSIS — R600 Localized edema: Secondary | ICD-10-CM

## 2019-12-04 DIAGNOSIS — I872 Venous insufficiency (chronic) (peripheral): Secondary | ICD-10-CM | POA: Diagnosis not present

## 2019-12-04 NOTE — Progress Notes (Signed)
*  PRELIMINARY RESULTS* Echocardiogram 2D Echocardiogram has been performed.  Leavy Cella 12/04/2019, 3:56 PM

## 2019-12-09 ENCOUNTER — Other Ambulatory Visit (HOSPITAL_COMMUNITY)
Admission: RE | Admit: 2019-12-09 | Discharge: 2019-12-09 | Disposition: A | Discharge status: Home / Self Care | Payer: Medicare HMO | Source: Ambulatory Visit | Attending: Cardiovascular Disease | Admitting: Cardiovascular Disease

## 2019-12-09 DIAGNOSIS — Z79899 Other long term (current) drug therapy: Secondary | ICD-10-CM | POA: Diagnosis present

## 2019-12-09 LAB — BASIC METABOLIC PANEL
Anion gap: 9 (ref 5–15)
BUN: 17 mg/dL (ref 8–23)
CO2: 27 mmol/L (ref 22–32)
Calcium: 9.3 mg/dL (ref 8.9–10.3)
Chloride: 102 mmol/L (ref 98–111)
Creatinine, Ser: 0.79 mg/dL (ref 0.44–1.00)
GFR calc Af Amer: >60 mL/min (ref >60)
GFR calc non Af Amer: >60 mL/min (ref >60)
Glucose, Bld: 109 mg/dL — ABNORMAL HIGH (ref 70–99)
Potassium: 3.7 mmol/L (ref 3.5–5.1)
Sodium: 138 mmol/L (ref 135–145)

## 2019-12-30 ENCOUNTER — Other Ambulatory Visit: Payer: Self-pay | Admitting: Nurse Practitioner

## 2019-12-30 ENCOUNTER — Ambulatory Visit: Payer: Medicare HMO | Admitting: Orthopedic Surgery

## 2020-01-01 ENCOUNTER — Other Ambulatory Visit: Payer: Self-pay | Admitting: Nurse Practitioner

## 2020-01-10 ENCOUNTER — Other Ambulatory Visit: Payer: Self-pay | Admitting: *Deleted

## 2020-01-10 DIAGNOSIS — I83893 Varicose veins of bilateral lower extremities with other complications: Secondary | ICD-10-CM

## 2020-01-21 ENCOUNTER — Other Ambulatory Visit: Payer: Self-pay

## 2020-01-21 ENCOUNTER — Ambulatory Visit (HOSPITAL_COMMUNITY)
Admission: RE | Admit: 2020-01-21 | Discharge: 2020-01-21 | Disposition: A | Discharge status: Home / Self Care | Payer: Medicare HMO | Source: Ambulatory Visit | Attending: Vascular Surgery | Admitting: Vascular Surgery

## 2020-01-21 ENCOUNTER — Ambulatory Visit: Payer: Medicare HMO | Admitting: Physician Assistant

## 2020-01-21 VITALS — BP 139/81 | HR 62 | Temp 98.2°F | Resp 20 | Ht 64.0 in | Wt 236.2 lb

## 2020-01-21 DIAGNOSIS — M7989 Other specified soft tissue disorders: Secondary | ICD-10-CM | POA: Diagnosis not present

## 2020-01-21 DIAGNOSIS — I83893 Varicose veins of bilateral lower extremities with other complications: Secondary | ICD-10-CM | POA: Diagnosis not present

## 2020-01-21 NOTE — Progress Notes (Signed)
VASCULAR [facility] VEIN SPECIALISTS           OF [place]  History and Physical   Regina Mcmahon is a 69 y.o. female who presents with hx of varicose veins.  She was referred by Dr. Bronson Ing.  These varicosities have been present since she was in her 70's.  He felt that her leg swelling was most likely due to venous insufficiency and an echo was ordered to determine if her leg swelling was due to a cardiac nature.   This revealed an EF of 65-70% with no evidence of MR or AR or AS.   He increased her lasix.  She is in need of a right hip replacement but they will not consider this until she quits smoking and loses about 20lbs.  She does a lot of coloring and is sitting a lot to do this.  She hasn't worn compression socks as she states she has a hard time putting on regular socks.  She has a hx of two pregnancies and one was a c-section.  She states that the left leg has started swelling over the past month.  She is compensating with her left leg due to her right hip issues.    She states the lasix is helping with her leg swelling.    She has hx of breast cancer and COPD.  She continues to smoke.  She isn't really ready to quit.  She states that she quit for 11 months at one time and picked them back up.  She has patches and lozenges at home, but they are still in the box.   The patient has does not history of DVT. Pt does have history of varicose vein.   Pt does not have history of skin changes in lower legs.   There is family history of venous disorders.   The patient has not used compression stockings in the past.    The pt is not on a statin for cholesterol management.  The pt is not on a daily aspirin.   Other AC:  none The pt is not on meds for hypertension.   The pt is not diabetic.   Tobacco hx:  current  No family hx of AAA.  Pt's sister present during office visit.   Past Medical History:  Diagnosis Date  . Asthma   . Asymptomatic varicose veins   . Breast cancer  ([place]) 06/2014   left  . Chronic airway obstruction ([place])   . COPD (chronic obstructive pulmonary disease) ([place])   . Depression   . Hemorrhage rectum    and anus.  . Hypertension   . Insomnia   . Other symptoms involving digestive system(787.99)   . Pain    Hx.pain in joint involving pelvic region and thigh; pain in limb  . Palpitations   . Panic attacks   . Personal history of chemotherapy 2016  . Personal history of radiation therapy 2016  . Sinusitis   . Symptomatic menopausal or female climacteric states     Past Surgical History:  Procedure Laterality Date  . BREAST BIOPSY Left 10/2012  . BREAST BIOPSY Left 07/01/2014   malignant  . BREAST LUMPECTOMY Left 08/20/2014  . CESAREAN SECTION    . CHOLECYSTECTOMY  1985  . COLONOSCOPY  2002   Dr. Lindalou Hose: internal hemorrhoids, one small rectal polyp, adenomatous path   . COLONOSCOPY N/A 11/23/2015   Procedure: COLONOSCOPY;  Surgeon: Danie Binder, MD;  [facility];  Service: Endoscopy;  Laterality: N/A;  1100 - moved to 10:45 - office to notify  . ESOPHAGOGASTRODUODENOSCOPY N/A 11/23/2015   Procedure: ESOPHAGOGASTRODUODENOSCOPY (EGD);  Surgeon: Danie Binder, MD;  [facility];  Service: Endoscopy;  Laterality: N/A;  . PORT-A-CATH REMOVAL  02/2015  . PORTACATH PLACEMENT Right 09/17/2014   Procedure: INSERTION PORT-A-CATH WITH ULTRASOUND;  Surgeon: Erroll Luna, MD;  Location: [place];  Service: General;  Laterality: Right;  IJ  . RADIOACTIVE SEED GUIDED PARTIAL MASTECTOMY WITH AXILLARY SENTINEL LYMPH NODE BIOPSY Left 08/20/2014   Procedure: LEFT BREAST LUMPECTOMY WITH RADIOACTIVE SEED LOCALIZATION AND SENTINEL LYMPH NODE MAPPING;  Surgeon: Erroll Luna, MD;  Location: [place];  Service: General;  Laterality: Left;  . RE-EXCISION OF BREAST LUMPECTOMY Left 09/17/2014   Procedure: RE-EXCISION OF BREAST LUMPECTOMY;  Surgeon: Erroll Luna, MD;  Location: [place];  Service: General;  Laterality: Left;  . TUBAL LIGATION      Social History   Socioeconomic History  . Marital status: Divorced    Spouse name: Not on file  . Number of children: 2  . Years of education: Not on file  . Highest education level: Not on file  Occupational History  . Not on file  Tobacco Use  . Smoking status: Current Every Day Smoker    Packs/day: 1.00    Types: Cigarettes    Last attempt to quit: 02/03/2016    Years since quitting: 3.9  . Smokeless tobacco: Never Used  Vaping Use  . Vaping Use: Never used  Substance and Sexual Activity  . Alcohol use: No    Alcohol/week: 0.0 standard drinks  . Drug use: No  . Sexual activity: Never    Birth control/protection: None  Other Topics Concern  . Not on file  Social History Narrative  . Not on file   Social Determinants of Health   Financial Resource Strain:   . Difficulty of Paying Living Expenses:   Food Insecurity:   . Worried About Charity fundraiser in the Last Year:   . Arboriculturist in the Last Year:   Transportation Needs:   . Film/video editor (Medical):   Marland Kitchen Lack of Transportation (Non-Medical):   Physical Activity:   . Days of Exercise per Week:   . Minutes of Exercise per Session:   Stress:   . Feeling of Stress :   Social Connections:   . Frequency of Communication with Friends and Family:   . Frequency of Social Gatherings with Friends and Family:   . Attends Religious Services:   . Active Member of Clubs or Organizations:   . Attends Archivist Meetings:   Marland Kitchen Marital Status:   Intimate Partner Violence:   . Fear of Current or Ex-Partner:   . Emotionally Abused:   Marland Kitchen Physically Abused:   . Sexually Abused:      Family History  Problem Relation Age of Onset  . Diabetes Mother   . Ovarian cancer Mother 71  . Other Father        died in MVA at age 15  . Other Sister        mitral valve disorder  . Melanoma Sister 54       removed from back of leg  .  Colon cancer Brother        early 60s, succumbed to disease  . Cancer Paternal Aunt  leukemia, bone, or lung cancer  . Alzheimer's disease Maternal Grandmother   . Heart attack Maternal Grandfather   . Heart attack Paternal Grandmother   . COPD Paternal Grandfather   . Emphysema Paternal Grandfather   . Congestive Heart Failure Paternal Aunt   . Stomach cancer Paternal Uncle   . Kidney cancer Maternal Uncle   . Cancer Cousin        dx. teens/dx. 40s  . Cancer Cousin   . Colon cancer Cousin     Current Outpatient Medications  Medication Sig Dispense Refill  . acetaminophen-codeine (TYLENOL #3) 300-30 MG tablet Take 1 tablet by mouth every 6 (six) hours as needed for moderate pain. 30 tablet 0  . albuterol (PROVENTIL HFA;VENTOLIN HFA) 108 (90 BASE) MCG/ACT inhaler Inhale 2 puffs into the lungs every 6 (six) hours as needed for wheezing or shortness of breath.    . ALPRAZolam (XANAX) 0.5 MG tablet Take 0.5 mg by mouth 3 (three) times daily as needed for anxiety.    Jearl Klinefelter ELLIPTA 62.5-25 MCG/INH AEPB 1 puff daily.    . calcium carbonate (TUMS - DOSED IN MG ELEMENTAL CALCIUM) 500 MG chewable tablet Chew 1 tablet by mouth 3 (three) times daily with meals. Reported on 12/07/2015    . furosemide (LASIX) 20 MG tablet 11/26/19 Take 40 mg twice a day for 1 week, then go back to 40 mg daily 40 tablet 3  . gabapentin (NEURONTIN) 300 MG capsule Take 1 capsule (300 mg total) by mouth 3 (three) times daily. 90 capsule 5  . hydrochlorothiazide (MICROZIDE) 12.5 MG capsule Take 12.5 mg by mouth daily. Reported on 12/07/2015    . ibuprofen (ADVIL) 800 MG tablet Take 800 mg by mouth 3 (three) times daily.    . pantoprazole (PROTONIX) 40 MG tablet Take 1 tablet by mouth once daily 90 tablet 0  . potassium chloride SA (KLOR-CON M20) 20 MEQ tablet Take 1 tablet (20 mEq total) by mouth 2 (two) times daily. 180 tablet 3  . sertraline (ZOLOFT) 100 MG tablet Take 100 mg by mouth daily.    . tamoxifen  (NOLVADEX) 20 MG tablet Take 1 tablet (20 mg total) by mouth daily. 30 tablet 3  . traMADol-acetaminophen (ULTRACET) 37.5-325 MG tablet Take 1 tablet by mouth every 4 (four) hours as needed. 90 tablet 5   No current facility-administered medications for this visit.    No Known Allergies  REVIEW OF SYSTEMS:   [X]  denotes positive finding, [ ]  denotes negative finding Cardiac  Comments:  Chest pain or chest pressure:    Shortness of breath upon exertion: x   Short of breath when lying flat:    Irregular heart rhythm:        Vascular    Pain in calf, thigh, or hip brought on by ambulation: x   Pain in feet at night that wakes you up from your sleep:  x   Blood clot in your veins:    Leg swelling:  x       Pulmonary    Oxygen at home:    Productive cough:     Wheezing:         Neurologic    Sudden weakness in arms or legs:     Sudden numbness in arms or legs:     Sudden onset of difficulty speaking or slurred speech:    Temporary loss of vision in one eye:     Problems with dizziness:  Gastrointestinal    Blood in stool:     Vomited blood:         Genitourinary    Burning when urinating:     Blood in urine:        Psychiatric    Major depression:         Hematologic    Bleeding problems: x   Problems with blood clotting too easily:        Skin    Rashes or ulcers:        Constitutional    Fever or chills:      PHYSICAL EXAMINATION:  Today's Vitals   01/21/20 1345  BP: 139/81  Pulse: 62  Resp: 20  Temp: 98.2 F (36.8 C)  TempSrc: Temporal  SpO2: 93%  Weight: 236 lb 3.2 oz (107.1 kg)  Height: 5\' 4"  (1.626 m)   Body mass index is 40.54 kg/m.   General:  WDWN in NAD; vital signs documented above Gait: Not observed HENT: WNL, normocephalic Pulmonary: normal non-labored breathing without wheezing Cardiac: regular HR; without carotid bruits Abdomen: soft, NT, no masses Skin: without rashes Vascular Exam/Pulses:  Right Left  Radial 2+  (normal) 2+ (normal)  DP 2+ (normal) 2+ (normal)  PT Unable to palpate  Unable to palpate    Extremities: without ischemic changes, without cellulitis; without open wounds; without skin color changes.  BLE swelling with right>left.  There are some small reticular veins present bilateral feet/ankles.  Musculoskeletal: no muscle wasting or atrophy  Neurologic: A&O X 3;  moving all extremities equally Psychiatric:  The pt has Normal affect.   Non-Invasive Vascular Imaging:   Venous duplex on 01/21/2020: Venous Reflux Times  +--------------------+---------+------+-----------+------------+--------+  RIGHT        Reflux NoRefluxReflux TimeDiameter cmsComments                  Yes                   +--------------------+---------+------+-----------+------------+--------+  CFV         no                         +--------------------+---------+------+-----------+------------+--------+  FV prox       no                         +--------------------+---------+------+-----------+------------+--------+  FV mid       no                         +--------------------+---------+------+-----------+------------+--------+  FV dist       no                         +--------------------+---------+------+-----------+------------+--------+  Popliteal      no                         +--------------------+---------+------+-----------+------------+--------+  GSV at [facility]     no               0.531        +--------------------+---------+------+-----------+------------+--------+  GSV prox thigh   no               0.644        +--------------------+---------+------+-----------+------------+--------+  GSV mid thigh    no  0.657        +--------------------+---------+------+-----------+------------+--------+  GSV dist thigh   no               0.402        +--------------------+---------+------+-----------+------------+--------+  GSV at knee     no               0.383        +--------------------+---------+------+-----------+------------+--------+  GSV prox calf                    0.380        +--------------------+---------+------+-----------+------------+--------+  GSV dist calf                    0.316        +--------------------+---------+------+-----------+------------+--------+  SSV Pop Fossa    no               0.201        +--------------------+---------+------+-----------+------------+--------+  SSV prox calf    no               0.214        +--------------------+---------+------+-----------+------------+--------+  AAGSV proximal thigh      yes  >500 ms   0.411        +--------------------+---------+------+-----------+------------+--------+  AAGSV mid thigh         yes  >500 ms   0.499        +--------------------+---------+------+-----------+------------+--------+     +--------------------+---------+------+-----------+------------+--------+  LEFT        Reflux NoRefluxReflux TimeDiameter cmsComments                  Yes                   +--------------------+---------+------+-----------+------------+--------+  CFV         no                         +--------------------+---------+------+-----------+------------+--------+  FV prox       no                         +--------------------+---------+------+-----------+------------+--------+  FV mid        no                         +--------------------+---------+------+-----------+------------+--------+  FV dist       no                         +--------------------+---------+------+-----------+------------+--------+  Popliteal      no                         +--------------------+---------+------+-----------+------------+--------+  GSV at [facility]     no               0.930        +--------------------+---------+------+-----------+------------+--------+  GSV prox thigh   no               0.705        +--------------------+---------+------+-----------+------------+--------+  GSV mid thigh    no               0.752        +--------------------+---------+------+-----------+------------+--------+  GSV dist thigh   no               0.752        +--------------------+---------+------+-----------+------------+--------+  GSV at knee     no               0.566        +--------------------+---------+------+-----------+------------+--------+  GSV prox calf                    0.517        +--------------------+---------+------+-----------+------------+--------+  GSV mid calf                    0.420        +--------------------+---------+------+-----------+------------+--------+  SSV Pop Fossa                    0.379        +--------------------+---------+------+-----------+------------+--------+  SSV prox calf    no               0.347        +--------------------+---------+------+-----------+------------+--------+  SSV mid calf    no               0.298          +--------------------+---------+------+-----------+------------+--------+  AAGSV proximal thighno               0.474        +--------------------+---------+------+-----------+------------+--------+  AAGSV mid thigh   no               0.408        +--------------------+---------+------+-----------+------------+--------+    Summary:  Bilateral:  - No evidence of deep vein thrombosis seen in the lower extremities,  bilaterally, from the common femoral through the popliteal veins.  - No evidence of superficial venous thrombosis in the lower extremities,  bilaterally.  - No evidence of deep venous insufficiency seen bilaterally in the lower  extremity.  - No evidence of superficial venous reflux seen in the short saphenous  veins bilaterally.    Right:  - Reflux is demonstrated in the anterior accessory great saphenous vein.    Left:  - No evidence of superficial venous reflux seen in the left greater  saphenous vein.     Regina Mcmahon is a 69 y.o. female who presents with: bilateral lower extremity leg swelling  The pt does not have significant reflux-she only has reflux in the accessory GSV on the right and therefore, is not a candidate for any laser ablation. -discussed with pt about wearing knee high 15-18mmHg compression stockings since she has difficulty putting on the socks.  She has received a pair from here prior to leaving and states her legs feel better with them on.  I also discussed elevating legs and gave her a pamphlet on how to elevate.  Also discussed swimming exercises if she has access to a pool.  We also discussed importance of weight loss. -had long discussion greater than 5 minutes about smoking cessation and the importance of this.  She isn't ready to quit yet, but hopeful she will be soon.  Discussed with her to speak with her PCP about Chantix or possibly Wellbutrin. -pt will f/u with [place] as needed.     Leontine Locket, [facility] Ve[facility] 01/21/2020 1:22 PM  Clinic MD:  Early

## 2020-01-23 ENCOUNTER — Telehealth: Payer: Medicare HMO | Admitting: Cardiovascular Disease

## 2020-01-23 ENCOUNTER — Other Ambulatory Visit: Payer: Self-pay

## 2020-01-23 ENCOUNTER — Ambulatory Visit (INDEPENDENT_AMBULATORY_CARE_PROVIDER_SITE_OTHER): Payer: Medicare HMO | Admitting: Orthopedic Surgery

## 2020-01-23 DIAGNOSIS — M5136 Other intervertebral disc degeneration, lumbar region: Secondary | ICD-10-CM

## 2020-01-23 DIAGNOSIS — F17200 Nicotine dependence, unspecified, uncomplicated: Secondary | ICD-10-CM

## 2020-01-23 DIAGNOSIS — M1611 Unilateral primary osteoarthritis, right hip: Secondary | ICD-10-CM | POA: Diagnosis not present

## 2020-01-23 DIAGNOSIS — Z6841 Body Mass Index (BMI) 40.0 and over, adult: Secondary | ICD-10-CM

## 2020-01-23 NOTE — Progress Notes (Signed)
Chief Complaint  Patient presents with  . Follow-up    Recheck on back pain   Back is hurting pretty badly  The patient has gotten 1 epidural she said it did help a little bit she says her back is really hurting badly now it is hard for her to bend over she also has the bad hip she is gone from 2 packs a day down to 1 pack/day in terms of the smoking  I looked at her MRI again she needs to repeat the epidural stop the smoking continue to monitor her weight looks like she is lost a few pounds which is great   Encounter Diagnoses  Name Primary?  . Morbid obesity ([place]) Yes  . Body mass index 40.0-44.9, adult ([place])   . DDD (degenerative disc disease), lumbar   . Primary osteoarthritis of right hip   . Tobacco use disorder    The patient meets the AMA guidelines for Morbid (severe) obesity with a BMI > 40.0 and I have recommended weight loss.   BP 136/76   Pulse 72   Ht 5\' 4"  (1.626 m)   Wt 236 lb (107 kg)   BMI 40.51 kg/m   L3-L4: Ligamentum flavum thickening and small subchondral cystic change on the right. Minor disc bulging greatest towards the right foramen. No neural compression  L4-L5: Facet degeneration with hypertrophy and small left joint effusion. Mild triangular narrowing of the thecal sac. Moderate left foraminal narrowing.  L5-S1:Unremarkable  IMPRESSION: 1. Degenerative changes most notable at L4-5 where there is facet osteoarthritis with anterolisthesis and disc narrowing. Moderate left foraminal narrowing at this level. 2. Diffusely patent spinal canal.   Electronically Signed By: Monte Fantasia M.D. On: 10/14/2019 08:45 Ibuprofen 800 she takes   Recommend she go for another round of ESI's  Chronic problems unresolved not improving Low risk injection ESI

## 2020-01-29 ENCOUNTER — Other Ambulatory Visit: Payer: Self-pay

## 2020-01-29 ENCOUNTER — Telehealth: Payer: Self-pay | Admitting: Physical Medicine and Rehabilitation

## 2020-01-29 ENCOUNTER — Inpatient Hospital Stay (HOSPITAL_COMMUNITY): Payer: Medicare HMO | Attending: Hematology

## 2020-01-29 DIAGNOSIS — F17210 Nicotine dependence, cigarettes, uncomplicated: Secondary | ICD-10-CM | POA: Diagnosis not present

## 2020-01-29 DIAGNOSIS — Z923 Personal history of irradiation: Secondary | ICD-10-CM | POA: Diagnosis not present

## 2020-01-29 DIAGNOSIS — R42 Dizziness and giddiness: Secondary | ICD-10-CM | POA: Insufficient documentation

## 2020-01-29 DIAGNOSIS — Z9221 Personal history of antineoplastic chemotherapy: Secondary | ICD-10-CM | POA: Diagnosis not present

## 2020-01-29 DIAGNOSIS — R05 Cough: Secondary | ICD-10-CM | POA: Diagnosis not present

## 2020-01-29 DIAGNOSIS — R200 Anesthesia of skin: Secondary | ICD-10-CM | POA: Diagnosis not present

## 2020-01-29 DIAGNOSIS — R197 Diarrhea, unspecified: Secondary | ICD-10-CM | POA: Diagnosis not present

## 2020-01-29 DIAGNOSIS — R5383 Other fatigue: Secondary | ICD-10-CM | POA: Diagnosis not present

## 2020-01-29 DIAGNOSIS — M8580 Other specified disorders of bone density and structure, unspecified site: Secondary | ICD-10-CM | POA: Insufficient documentation

## 2020-01-29 DIAGNOSIS — K5900 Constipation, unspecified: Secondary | ICD-10-CM | POA: Diagnosis not present

## 2020-01-29 DIAGNOSIS — C50412 Malignant neoplasm of upper-outer quadrant of left female breast: Secondary | ICD-10-CM | POA: Insufficient documentation

## 2020-01-29 DIAGNOSIS — F329 Major depressive disorder, single episode, unspecified: Secondary | ICD-10-CM | POA: Diagnosis not present

## 2020-01-29 DIAGNOSIS — Z170 Estrogen receptor positive status [ER+]: Secondary | ICD-10-CM | POA: Diagnosis not present

## 2020-01-29 DIAGNOSIS — E559 Vitamin D deficiency, unspecified: Secondary | ICD-10-CM | POA: Diagnosis not present

## 2020-01-29 DIAGNOSIS — Z79899 Other long term (current) drug therapy: Secondary | ICD-10-CM | POA: Insufficient documentation

## 2020-01-29 DIAGNOSIS — G479 Sleep disorder, unspecified: Secondary | ICD-10-CM | POA: Diagnosis not present

## 2020-01-29 DIAGNOSIS — M7989 Other specified soft tissue disorders: Secondary | ICD-10-CM | POA: Insufficient documentation

## 2020-01-29 DIAGNOSIS — Z79811 Long term (current) use of aromatase inhibitors: Secondary | ICD-10-CM | POA: Insufficient documentation

## 2020-01-29 LAB — CBC WITH DIFFERENTIAL/PLATELET
Abs Immature Granulocytes: 0.02 10*3/uL (ref 0.00–0.07)
Basophils Absolute: 0 10*3/uL (ref 0.0–0.1)
Basophils Relative: 1 %
Eosinophils Absolute: 0.3 10*3/uL (ref 0.0–0.5)
Eosinophils Relative: 4 %
HCT: 48.2 % — ABNORMAL HIGH (ref 36.0–46.0)
Hemoglobin: 16.4 g/dL — ABNORMAL HIGH (ref 12.0–15.0)
Immature Granulocytes: 0 %
Lymphocytes Relative: 23 %
Lymphs Abs: 1.6 10*3/uL (ref 0.7–4.0)
MCH: 32 pg (ref 26.0–34.0)
MCHC: 34 g/dL (ref 30.0–36.0)
MCV: 94 fL (ref 80.0–100.0)
Monocytes Absolute: 0.4 10*3/uL (ref 0.1–1.0)
Monocytes Relative: 6 %
Neutro Abs: 4.7 10*3/uL (ref 1.7–7.7)
Neutrophils Relative %: 66 %
Platelets: 199 10*3/uL (ref 150–400)
RBC: 5.13 MIL/uL — ABNORMAL HIGH (ref 3.87–5.11)
RDW: 12.3 % (ref 11.5–15.5)
WBC: 6.9 10*3/uL (ref 4.0–10.5)
nRBC: 0 % (ref 0.0–0.2)

## 2020-01-29 LAB — COMPREHENSIVE METABOLIC PANEL
ALT: 17 U/L (ref 0–44)
AST: 15 U/L (ref 15–41)
Albumin: 3.7 g/dL (ref 3.5–5.0)
Alkaline Phosphatase: 60 U/L (ref 38–126)
Anion gap: 10 (ref 5–15)
BUN: 17 mg/dL (ref 8–23)
CO2: 28 mmol/L (ref 22–32)
Calcium: 9 mg/dL (ref 8.9–10.3)
Chloride: 102 mmol/L (ref 98–111)
Creatinine, Ser: 0.89 mg/dL (ref 0.44–1.00)
GFR calc Af Amer: >60 mL/min (ref >60)
GFR calc non Af Amer: >60 mL/min (ref >60)
Glucose, Bld: 148 mg/dL — ABNORMAL HIGH (ref 70–99)
Potassium: 2.9 mmol/L — ABNORMAL LOW (ref 3.5–5.1)
Sodium: 140 mmol/L (ref 135–145)
Total Bilirubin: 0.9 mg/dL (ref 0.3–1.2)
Total Protein: 6.6 g/dL (ref 6.5–8.1)

## 2020-01-29 LAB — VITAMIN D 25 HYDROXY (VIT D DEFICIENCY, FRACTURES): Vit D, 25-Hydroxy: 12.83 ng/mL — ABNORMAL LOW (ref 30–100)

## 2020-01-29 NOTE — Telephone Encounter (Signed)
Please Advise

## 2020-01-29 NOTE — Telephone Encounter (Signed)
Documentation in referral.

## 2020-01-29 NOTE — Telephone Encounter (Signed)
Patient called requesting an appt. Need appt for right hip and back injections. Please give patient a call back. Referral doctor from [place]. Patient phone number is [phone].

## 2020-01-31 NOTE — Telephone Encounter (Signed)
Voicemail left on 7/7

## 2020-02-05 ENCOUNTER — Other Ambulatory Visit (HOSPITAL_COMMUNITY): Payer: Self-pay | Admitting: Hematology

## 2020-02-05 ENCOUNTER — Inpatient Hospital Stay (HOSPITAL_COMMUNITY): Payer: Medicare HMO | Admitting: Hematology

## 2020-02-05 ENCOUNTER — Other Ambulatory Visit: Payer: Self-pay

## 2020-02-05 VITALS — BP 137/75 | HR 73 | Temp 97.2°F | Resp 20 | Wt 237.2 lb

## 2020-02-05 DIAGNOSIS — Z170 Estrogen receptor positive status [ER+]: Secondary | ICD-10-CM

## 2020-02-05 DIAGNOSIS — C50412 Malignant neoplasm of upper-outer quadrant of left female breast: Secondary | ICD-10-CM

## 2020-02-05 DIAGNOSIS — F17200 Nicotine dependence, unspecified, uncomplicated: Secondary | ICD-10-CM | POA: Diagnosis not present

## 2020-02-05 MED ORDER — ERGOCALCIFEROL 1.25 MG (50000 UT) PO CAPS
50000.0000 [IU] | ORAL_CAPSULE | ORAL | 6 refills | Status: DC
Start: 2020-02-05 — End: 2020-05-28

## 2020-02-05 NOTE — Progress Notes (Signed)
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    06/24/2014 Breast US   Left breast: ill-defined predominantly hypoechoic area with some increased echogenicity in the 2 o'clock position of the left breast, 7 cm from the nipple. 2.6 x 2.3 x 1.8 cm in maximum dimensions.   07/01/2014 Initial Biopsy   Left breast core needle bx: Invasive mammary carcinoma with lobular features, ER+ (100%), PR+ (91%), HER2/neu negative (ratio 1.09), Ki-67 32%. E-cadherin strongly positive, diagnostic for IDC    07/01/2014 Clinical Stage   Stage IIA: T2 N0   08/20/2014 Definitive Surgery   Left breast seed localized lumpectomy/SLNB: IDC, +LVI, DCIS, 1 LN removed and positive for metastatic carcinoma. Grade 2. HER2/neu repeated and negative (ratio 0.69-1.9).    08/20/2014 Pathologic Stage   Stage IIB: pT2 pN1a pMx   09/17/2014 Surgery   Port-a cath placement and re-excision   10/02/2014 - 01/15/2015 Chemotherapy   CMF x 6 cycles.  patient refused Taxane-containing chemotherapy.   03/07/2015 Procedure   Comp Cancer Panel reveals no variant at APC, ATM, AXIN2, BARD1,  BMPR1A, BRCA1, BRCA2, BRIP1, CDH1, CDK4, CDKN2A, CHEK2, FANCC, MLH1, MSH2, MSH6, MUTYH, NBN, PALB2, PMS2, POLD1, POLE, PTEN, RAD51C, RAD51D, SMAD4, STK11, TP53, VHL, and XRCC2.   04/06/2015 - 05/21/2015 Radiation Therapy   Adjuvant RT Pablo Ledger): Left breast/ 45 Gy over 25 fractions.  Left supraclavicular fossa and axilla/ 45 Gy over 25 fractions. Left breast boost/ 16 Gy over 8 fractions. Total dose: 61 Gy   06/04/2015 - 08/07/2015 Anti-estrogen oral therapy   Exemestane 25 mg daily.  Planned duration of therapy 5 years.    07/23/2015 Survivorship   Survivorship care plan completed and mailed to patient in lieu of in person visit   08/06/2015 Adverse Reaction   Severe hot flashes and mood swings   08/08/2015 - 12/07/2015 Anti-estrogen oral therapy   Arimidex   12/04/2015 Adverse Reaction   Worsening depression, patient stopped Arimidex   12/07/2015 -  Anti-estrogen oral therapy   Tamoxifen     CHIEF COMPLIANT: Left breast cancer and osteopenia   INTERVAL HISTORY: Regina Mcmahon is a 69 y.o. female here today for follow up of her left breast cancer and osteopenia. Her last visit was on 08/07/2019.  Today she reports that she needs to lose weight to qualify for right hip replacement. She takes potassium 20 mEq tablet daily.  She has cut down to smoking 1 PPD.   REVIEW OF SYSTEMS:   Review of Systems  Constitutional: Positive for fatigue (severe). Negative for appetite change.  Respiratory: Positive for cough.   Cardiovascular: Positive for leg swelling.  Gastrointestinal: Positive for constipation and diarrhea.  Musculoskeletal: Positive for arthralgias (6/10 R hip pain).  Neurological: Positive  for dizziness and numbness (feet).  Psychiatric/Behavioral: Positive for depression and sleep disturbance. The patient is nervous/anxious.   All other systems reviewed and are negative.   I have reviewed the past medical history, past surgical history, social history and family  history with the patient and they are unchanged from previous note.   ALLERGIES:   has No Known Allergies.   MEDICATIONS:  Current Outpatient Medications  Medication Sig Dispense Refill  . ANORO ELLIPTA 62.5-25 MCG/INH AEPB 1 puff daily.    . calcium carbonate (TUMS - DOSED IN MG ELEMENTAL CALCIUM) 500 MG chewable tablet Chew 1 tablet by mouth 3 (three) times daily with meals. Reported on 12/07/2015    . furosemide (LASIX) 20 MG tablet 11/26/19 Take 40 mg twice a day for 1 week, then go back to 40 mg daily 40 tablet 3  . gabapentin (NEURONTIN) 300 MG capsule Take 1 capsule (300 mg total) by mouth 3 (three) times daily. 90 capsule 5  . hydrochlorothiazide (MICROZIDE) 12.5 MG capsule Take 12.5 mg by mouth daily. Reported on 12/07/2015    . ibuprofen (ADVIL) 800 MG tablet Take 800 mg by mouth 3 (three) times daily.    . pantoprazole (PROTONIX) 40 MG tablet Take 1 tablet by mouth once daily 90 tablet 0  . potassium chloride SA (KLOR-CON M20) 20 MEQ tablet Take 1 tablet (20 mEq total) by mouth 2 (two) times daily. 180 tablet 3  . sertraline (ZOLOFT) 100 MG tablet Take 100 mg by mouth daily.    . tamoxifen (NOLVADEX) 20 MG tablet Take 1 tablet (20 mg total) by mouth daily. 30 tablet 3  . traMADol-acetaminophen (ULTRACET) 37.5-325 MG tablet Take 1 tablet by mouth every 4 (four) hours as needed. 90 tablet 5  . acetaminophen-codeine (TYLENOL #3) 300-30 MG tablet Take 1 tablet by mouth every 6 (six) hours as needed for moderate pain. (Patient not taking: Reported on 02/05/2020) 30 tablet 0  . albuterol (PROVENTIL HFA;VENTOLIN HFA) 108 (90 BASE) MCG/ACT inhaler Inhale 2 puffs into the lungs every 6 (six) hours as needed for wheezing or shortness of breath. (Patient not taking: Reported on 02/05/2020)    . ALPRAZolam (XANAX) 0.5 MG tablet Take 0.5 mg by mouth 3 (three) times daily as needed for anxiety. (Patient not taking: Reported on 02/05/2020)     No current facility-administered medications for this  visit.     PHYSICAL EXAMINATION: Performance status (ECOG): 1 - Symptomatic but completely ambulatory  Vitals:   02/05/20 1444  BP: 137/75  Pulse: 73  Resp: 20  Temp: (!) 97.2 F (36.2 C)  SpO2: 96%   Wt Readings from Last 3 Encounters:  02/05/20 237 lb 3.2 oz (107.6 kg)  01/23/20 236 lb (107 kg)  01/21/20 236 lb 3.2 oz (107.1 kg)   Physical Exam Chest:     Chest wall: No mass or tenderness.     Breasts:        Right: No swelling, bleeding or mass.        Left: No swelling, bleeding or mass.    Musculoskeletal:     Right lower leg: Edema (1+) present.     Left lower leg: Edema (1+) present.  Lymphadenopathy:     Cervical: No cervical adenopathy.     Upper Body:     Right upper body: No supraclavicular or axillary adenopathy.     Left upper body: No supraclavicular or axillary adenopathy.     Lower Body: No right inguinal adenopathy. No left inguinal adenopathy.  Breast Exam Chaperone: Milinda Antis, MD     LABORATORY DATA:  I have reviewed the data as listed CMP Latest Ref Rng & Units 01/29/2020 12/09/2019 11/29/2019  Glucose 70 - 99 mg/dL 148(H) 109(H) 117(H)  BUN 8 - 23 mg/dL '17 17 15  ' Creatinine 0.44 - 1.00 mg/dL 0.89 0.79 0.82  Sodium 135 - 145 mmol/L 140 138 139  Potassium 3.5 - 5.1 mmol/L 2.9(L) 3.7 3.2(L)  Chloride 98 - 111 mmol/L 102 102 101  CO2 22 - 32 mmol/L '28 27 28  ' Calcium 8.9 - 10.3 mg/dL 9.0 9.3 9.2  Total Protein 6.5 - 8.1 g/dL 6.6 - -  Total Bilirubin 0.3 - 1.2 mg/dL 0.9 - -  Alkaline Phos 38 - 126 U/L 60 - -  AST 15 - 41 U/L 15 - -  ALT 0 - 44 U/L 17 - -   No results found for: OEH212 Lab Results  Component Value Date   WBC 6.9 01/29/2020   HGB 16.4 (H) 01/29/2020   HCT 48.2 (H) 01/29/2020   MCV 94.0 01/29/2020   PLT 199 01/29/2020   NEUTROABS 4.7 01/29/2020    ASSESSMENT:  1.  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  This was followed by XRT. -Aromasin started on 06/04/2015, switched to Arimidex on 08/07/2015, tamoxifen on 12/02/2015. -Biopsy on 06/18/2019 for calcifications on mammogram dated 06/11/2019.  Pathology showed scar necrosis and dystrophic calcifications with no evidence of malignancy.  2.  Osteopenia: -DEXA scan from 08/05/2019 T score of -1.9.  3.  Smoking history: -2 packs/day for 39 years.  Currently smoking 1 pack/day.   PLAN:  1.  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 -Reviewed labs which are grossly within normal limits. -We will arrange for mammogram in November.  RTC 6 months.  2.  Osteopenia: -Continue calcium and vitamin D.  3.  Smoking history: -Because of her smoking history, I have counseled her for CT low-dose screening scan.  She finally agreed.  We will order the scan.  4.  Vitamin D deficiency: -Vitamin D was 12.8.  We will start her on vitamin D 50,000 units weekly.   No orders of the defined types were placed in this encounter.  The patient has a good understanding of the overall plan. she agrees with it. she will call with any problems that may develop before the next visit here.    Derek Jack, MD [place] [phone]   I, Milinda Antis, am acting as a scribe for Dr. Sanda Linger.  I, Derek Jack MD, have reviewed the above documentation for accuracy and completeness, and I agree with the above.

## 2020-02-05 NOTE — Patient Instructions (Signed)
[place] at [facility] Discharge Instructions  You were seen today by Dr. Delton Coombes. He went over your recent results. You will be prescribed vitamin D and take 50,000 units once a week. You will be scheduled for a CT scan of your chest and a mammogram. Dr. Delton Coombes will see you back in 6 months for labs and follow up.   Thank you for choosing [place] at [facility] to provide your oncology and hematology care.  To afford each patient quality time with our provider, please arrive at least 15 minutes before your scheduled appointment time.   If you have a lab appointment with the [facility] please come in thru the Main Entrance and check in at the main information desk  You need to re-schedule your appointment should you arrive 10 or more minutes late.  We strive to give you quality time with our providers, and arriving late affects you and other patients whose appointments are after yours.  Also, if you no show three or more times for appointments you may be dismissed from the clinic at the providers discretion.     Again, thank you for choosing [facility].  Our hope is that these requests will decrease the amount of time that you wait before being seen by our physicians.       _____________________________________________________________  Should you have questions after your visit to [facility], please contact our office at [phone] between the hours of 8:00 a.m. and 4:30 p.m.  Voicemails left after 4:00 p.m. will not be returned until the following business day.  For prescription refill requests, have your pharmacy contact our office and allow 72 hours.    Cancer Center Support Programs:   > Cancer Support Group  2nd Tuesday of the month 1pm-2pm, Journey Room

## 2020-02-07 ENCOUNTER — Telehealth: Payer: Medicare HMO | Admitting: Student

## 2020-02-24 ENCOUNTER — Ambulatory Visit: Payer: Self-pay

## 2020-02-24 ENCOUNTER — Other Ambulatory Visit: Payer: Self-pay

## 2020-02-24 ENCOUNTER — Ambulatory Visit (INDEPENDENT_AMBULATORY_CARE_PROVIDER_SITE_OTHER): Payer: Medicare HMO | Admitting: Physical Medicine and Rehabilitation

## 2020-02-24 VITALS — BP 136/86 | HR 66

## 2020-02-24 DIAGNOSIS — M5416 Radiculopathy, lumbar region: Secondary | ICD-10-CM

## 2020-02-24 MED ORDER — METHYLPREDNISOLONE ACETATE 80 MG/ML IJ SUSP
80.0000 mg | Freq: Once | INTRAMUSCULAR | Status: AC
  Administered 2020-02-24: 80 mg

## 2020-02-24 NOTE — Progress Notes (Signed)
Pt states she feels pain from her groin to the back of her leg down to her foot. Pt states when is gets up in the morning she stiff in pain. Pt states she take meds for the pain. Pt has hx of inj on 11/13/19 it was good for a week and half. Pt states by May she started to feel the pain again.  Numeric Pain Rating Scale and Functional Assessment Average Pain 8   In the last MONTH (on 0-10 scale) has pain interfered with the following?  1. General activity like being  able to carry out your everyday physical activities such as walking, climbing stairs, carrying groceries, or moving a chair?  Rating(10)   +Driver, -BT, -Dye Allergies.

## 2020-02-25 NOTE — Progress Notes (Signed)
Regina Mcmahon - 69 y.o. female MRN [id number]  Date of birth: November 19, 1950  Office Visit Note: Visit Date: 02/24/2020 PCP: [name], MD Referred by: [name], MD  Subjective: Chief Complaint  Patient presents with  . Right Leg - Pain  . Right Knee - Pain  . Lower Back - Pain   HPI:  Regina Mcmahon is a 69 y.o. female who comes in today for planned repeat Right L4-L5 Lumbar epidural steroid injection with fluoroscopic guidance.  The patient has failed conservative care including home exercise, medications, time and activity modification.  This injection will be diagnostic and hopefully therapeutic.  Please see requesting physician notes for further details and justification. Patient received more than 50% pain relief from prior injection.   Referring: Dr. Arther Abbott  Patient with significant pain today very consistent with hip pathology on the right with known hip osteoarthritis.   ROS Otherwise per HPI.  Assessment & Plan: Visit Diagnoses:  1. Lumbar radiculopathy     Plan: No additional findings.   Meds & Orders:  Meds ordered this encounter  Medications  . methylPREDNISolone acetate (DEPO-MEDROL) injection 80 mg    Orders Placed This Encounter  Procedures  . XR C-ARM NO REPORT  . Epidural Steroid injection    Follow-up: Return for visit to requesting physician as needed.   Procedures: No procedures performed  Lumbar Epidural Steroid Injection - Interlaminar Approach with Fluoroscopic Guidance  Patient: Regina Mcmahon      Date of Birth: [birth year] MRN: [id number] PCP: [name], MD      Visit Date: 02/24/2020   Universal Protocol:     Consent Given By: the patient  Position: PRONE  Additional Comments: Vital signs were monitored before and after the procedure. Patient was prepped and draped in the usual sterile fashion. The correct patient, procedure, and site was verified.   Injection Procedure Details:  Procedure Site One Meds  Administered:  Meds ordered this encounter  Medications  . methylPREDNISolone acetate (DEPO-MEDROL) injection 80 mg     Laterality: Right  Location/Site:  L4-L5  Needle size: 20 G  Needle type: Tuohy  Needle Placement: Paramedian epidural  Findings:   -Comments: Excellent flow of contrast into the epidural space.  Procedure Details: Using a paramedian approach from the side mentioned above, the region overlying the inferior lamina was localized under fluoroscopic visualization and the soft tissues overlying this structure were infiltrated with 4 ml. of 1% Lidocaine without Epinephrine. The Tuohy needle was inserted into the epidural space using a paramedian approach.   The epidural space was localized using loss of resistance along with lateral and bi-planar fluoroscopic views.  After negative aspirate for air, blood, and CSF, a 2 ml. volume of Isovue-250 was injected into the epidural space and the flow of contrast was observed. Radiographs were obtained for documentation purposes.    The injectate was administered into the level noted above.   Additional Comments:  The patient tolerated the procedure well Dressing: 2 x 2 sterile gauze and Band-Aid    Post-procedure details: Patient was observed during the procedure. Post-procedure instructions were reviewed.  Patient left the clinic in stable condition.    Clinical History: MRI LUMBAR SPINE WITHOUT CONTRAST    TECHNIQUE:  Multiplanar, multisequence MR imaging of the lumbar spine was  performed. No intravenous contrast was administered.    COMPARISON: None.    FINDINGS:  Segmentation: Standard lumbar numbering    Alignment: Grade 1 anterolisthesis at  L4-5, facet mediated    Vertebrae: No fracture, evidence of discitis, or bone lesion.    Conus medullaris and cauda equina: Conus extends to the L1-2 level.  Conus and cauda equina appear normal.    Paraspinal and other soft tissues: Negative     Disc levels:    Lower thoracic spondylosis.    T12- L1: Spondylosis. Mild disc bulging and ligamentum flavum  thickening. No impingement    L1-L2: Mild posterior element hypertrophy. No impingement    L2-L3: Unremarkable.    L3-L4: Ligamentum flavum thickening and small subchondral cystic  change on the right. Minor disc bulging greatest towards the right  foramen. No neural compression    L4-L5: Facet degeneration with hypertrophy and small left joint  effusion. Mild triangular narrowing of the thecal sac. Moderate left  foraminal narrowing.    L5-S1:Unremarkable    IMPRESSION:  1. Degenerative changes most notable at L4-5 where there is facet  osteoarthritis with anterolisthesis and disc narrowing. Moderate  left foraminal narrowing at this level.  2. Diffusely patent spinal canal.      Electronically Signed  By: Monte Fantasia M.D.  On: 10/14/2019 08:45 -- EXAM: DG HIP (WITH OR WITHOUT PELVIS) 5+V BILAT  COMPARISON:  None.  FINDINGS: Moderate to severe degenerative change in the right hip joint with joint space narrowing and spurring.  Left hip joint normal.  Negative for fracture or mass.  IMPRESSION: Moderate to advanced degenerative change right hip   Electronically Signed   By: Franchot Gallo M.D.   On: 12/20/2018 13:57     Objective:  VS:  HT:    WT:   BMI:     BP:136/86  HR:66bpm  TEMP: ( )  RESP:  Physical Exam Constitutional:      General: She is not in acute distress.    Appearance: Normal appearance. She is obese. She is not ill-appearing.  HENT:     Head: Normocephalic and atraumatic.     Right Ear: External ear normal.     Left Ear: External ear normal.  Eyes:     Extraocular Movements: Extraocular movements intact.  Cardiovascular:     Rate and Rhythm: Normal rate.     Pulses: Normal pulses.  Musculoskeletal:     Right lower leg: No edema.     Left lower leg: No edema.     Comments: Patient has good  distal strength with no pain over the greater trochanters.  No clonus or focal weakness.  Severe pain with hip range of motion on the right.  Ambulates with a cane with antalgic gait.  No Trendelenburg gait.  Skin:    Findings: No erythema, lesion or rash.  Neurological:     General: No focal deficit present.     Mental Status: She is alert and oriented to person, place, and time.     Sensory: No sensory deficit.     Motor: No weakness or abnormal muscle tone.     Coordination: Coordination normal.  Psychiatric:        Mood and Affect: Mood normal.        Behavior: Behavior normal.      Imaging: XR C-ARM NO REPORT  Result Date: 02/24/2020 Please see Notes tab for imaging impression.

## 2020-02-25 NOTE — Procedures (Signed)
Lumbar Epidural Steroid Injection - Interlaminar Approach with Fluoroscopic Guidance  Patient: Regina Mcmahon      Date of Birth: [birth year] MRN: [id number] PCP: [name], MD      Visit Date: 02/24/2020   Universal Protocol:     Consent Given By: the patient  Position: PRONE  Additional Comments: Vital signs were monitored before and after the procedure. Patient was prepped and draped in the usual sterile fashion. The correct patient, procedure, and site was verified.   Injection Procedure Details:  Procedure Site One Meds Administered:  Meds ordered this encounter  Medications  . methylPREDNISolone acetate (DEPO-MEDROL) injection 80 mg     Laterality: Right  Location/Site:  L4-L5  Needle size: 20 G  Needle type: Tuohy  Needle Placement: Paramedian epidural  Findings:   -Comments: Excellent flow of contrast into the epidural space.  Procedure Details: Using a paramedian approach from the side mentioned above, the region overlying the inferior lamina was localized under fluoroscopic visualization and the soft tissues overlying this structure were infiltrated with 4 ml. of 1% Lidocaine without Epinephrine. The Tuohy needle was inserted into the epidural space using a paramedian approach.   The epidural space was localized using loss of resistance along with lateral and bi-planar fluoroscopic views.  After negative aspirate for air, blood, and CSF, a 2 ml. volume of Isovue-250 was injected into the epidural space and the flow of contrast was observed. Radiographs were obtained for documentation purposes.    The injectate was administered into the level noted above.   Additional Comments:  The patient tolerated the procedure well Dressing: 2 x 2 sterile gauze and Band-Aid    Post-procedure details: Patient was observed during the procedure. Post-procedure instructions were reviewed.  Patient left the clinic in stable condition.

## 2020-02-28 ENCOUNTER — Ambulatory Visit (HOSPITAL_COMMUNITY): Payer: Medicare HMO

## 2020-03-04 ENCOUNTER — Encounter (HOSPITAL_COMMUNITY): Payer: Self-pay

## 2020-03-04 NOTE — Progress Notes (Signed)
I called and spoke with the patient today regarding the Lung Cancer Screening Program. Patient has had SDMV. I attempted to schedule patient for LDCT. Patient states she is apprehensive about enrolling in the program at this time. I provided my contact information and encouraged the patient to call when she is ready to proceed with program enrollment. I will close this referral at this time.

## 2020-03-05 ENCOUNTER — Ambulatory Visit: Payer: Medicare HMO | Admitting: Orthopedic Surgery

## 2020-05-28 ENCOUNTER — Encounter: Payer: Self-pay | Admitting: Orthopedic Surgery

## 2020-05-28 ENCOUNTER — Other Ambulatory Visit: Payer: Self-pay

## 2020-05-28 ENCOUNTER — Ambulatory Visit (INDEPENDENT_AMBULATORY_CARE_PROVIDER_SITE_OTHER): Payer: Medicare HMO | Admitting: Orthopedic Surgery

## 2020-05-28 DIAGNOSIS — M1611 Unilateral primary osteoarthritis, right hip: Secondary | ICD-10-CM | POA: Diagnosis not present

## 2020-05-28 DIAGNOSIS — Z6841 Body Mass Index (BMI) 40.0 and over, adult: Secondary | ICD-10-CM | POA: Diagnosis not present

## 2020-05-28 DIAGNOSIS — M5136 Other intervertebral disc degeneration, lumbar region: Secondary | ICD-10-CM

## 2020-05-28 DIAGNOSIS — F17200 Nicotine dependence, unspecified, uncomplicated: Secondary | ICD-10-CM

## 2020-05-28 NOTE — Patient Instructions (Signed)
Consult pain management   See primary care for smoking cessation  See nutritionist

## 2020-05-28 NOTE — Progress Notes (Signed)
Chief Complaint  Patient presents with  . Back Pain    lumber pain worse,   . Hip Pain    right hip worse.     Encounter Diagnoses  Name Primary?  . Morbid obesity ([place]) Yes  . Body mass index 40.0-44.9, adult ([place])   . DDD (degenerative disc disease), lumbar   . Primary osteoarthritis of right hip   . Tobacco use disorder    69 year old female with osteoarthritis of the hip on the right and degenerative disc disease lumbar spine along with morbid obesity and smoking tobacco use disorder  Her weight is come down on last visit 236 pounds BMI of 40.5 and she was undergoing epidural injections.  Her treatment plan was for her to stop smoking// pack a day   Weight loss  She apparently has lost 9 pounds by weight today  Received for the epidural injection 2 in back and 1 in hip   She comes back today for follow-up complaining of worsening hip and back pain status post 2 ESI's  She tells me she cannot stop smoking because she thinks it will make her eat more  She is also depressed despite being on antidepressants    BP 139/79   Pulse 67   Ht 5\' 4"  (1.626 m)   Wt 227 lb (103 kg)   BMI 38.96 kg/m   I had a long talk with her and her sister  She is not participating well in her preop preparation.  She has several risk factors for poor surgical outcome including but not limited to Obesity Smoking 1 pack/day which is a decrease for Depression  She was emotional in the office today  We talked about this at length  We talked about her risks her opioid risks of her Intra-Op and postop risk  I did not feel comfortable doing her surgery until she gets her depression under control smoking under control and continues her weight loss plan  MRI spine October 14, 2019 Lower thoracic spondylosis.  T12- L1: Spondylosis. Mild disc bulging and ligamentum flavum thickening. No impingement  L1-L2: Mild posterior element hypertrophy.  No impingement  L2-L3:  Unremarkable.  L3-L4: Ligamentum flavum thickening and small subchondral cystic change on the right. Minor disc bulging greatest towards the right foramen. No neural compression  L4-L5: Facet degeneration with hypertrophy and small left joint effusion. Mild triangular narrowing of the thecal sac. Moderate left foraminal narrowing.  L5-S1:Unremarkable  IMPRESSION: 1. Degenerative changes most notable at L4-5 where there is facet osteoarthritis with anterolisthesis and disc narrowing. Moderate left foraminal narrowing at this level. 2. Diffusely patent spinal canal.   Electronically Signed   By: Monte Fantasia M.D.   On: 10/14/2019 08:45  Last hip x-ray Dec 20, 2018 She has moderate joint space narrowing I would call it severe there is no gross deformity  When we get closer to surgery we can reevaluate the hip by x-ray  The appointment was mostly spent in consultation with the patient and her sister for time of 30 to 35 minutes

## 2020-06-23 ENCOUNTER — Other Ambulatory Visit (HOSPITAL_COMMUNITY): Payer: Medicare HMO

## 2020-06-23 ENCOUNTER — Ambulatory Visit (HOSPITAL_COMMUNITY)
Admission: RE | Admit: 2020-06-23 | Discharge: 2020-06-23 | Disposition: A | Discharge status: Home / Self Care | Payer: Medicare HMO | Source: Ambulatory Visit | Attending: Hematology | Admitting: Hematology

## 2020-06-23 ENCOUNTER — Encounter (HOSPITAL_COMMUNITY): Payer: Medicare HMO

## 2020-06-23 ENCOUNTER — Other Ambulatory Visit: Payer: Self-pay

## 2020-06-23 DIAGNOSIS — Z170 Estrogen receptor positive status [ER+]: Secondary | ICD-10-CM

## 2020-06-23 DIAGNOSIS — C50412 Malignant neoplasm of upper-outer quadrant of left female breast: Secondary | ICD-10-CM | POA: Insufficient documentation

## 2020-06-23 IMAGING — MG DIGITAL DIAGNOSTIC BILAT W/ TOMO W/ CAD
6 of 9 series · 6 of 25 positions shown · non-contrast
Comparison: Previous exam(s).

CLINICAL DATA: Status post left lumpectomy, radiation therapy and
chemotherapy for breast cancer in [48]. The patient had a left
breast benign stereotactic guided core needle biopsy in [48].

EXAM:
DIGITAL DIAGNOSTIC BILATERAL MAMMOGRAM WITH TOMO AND CAD

[L CC]
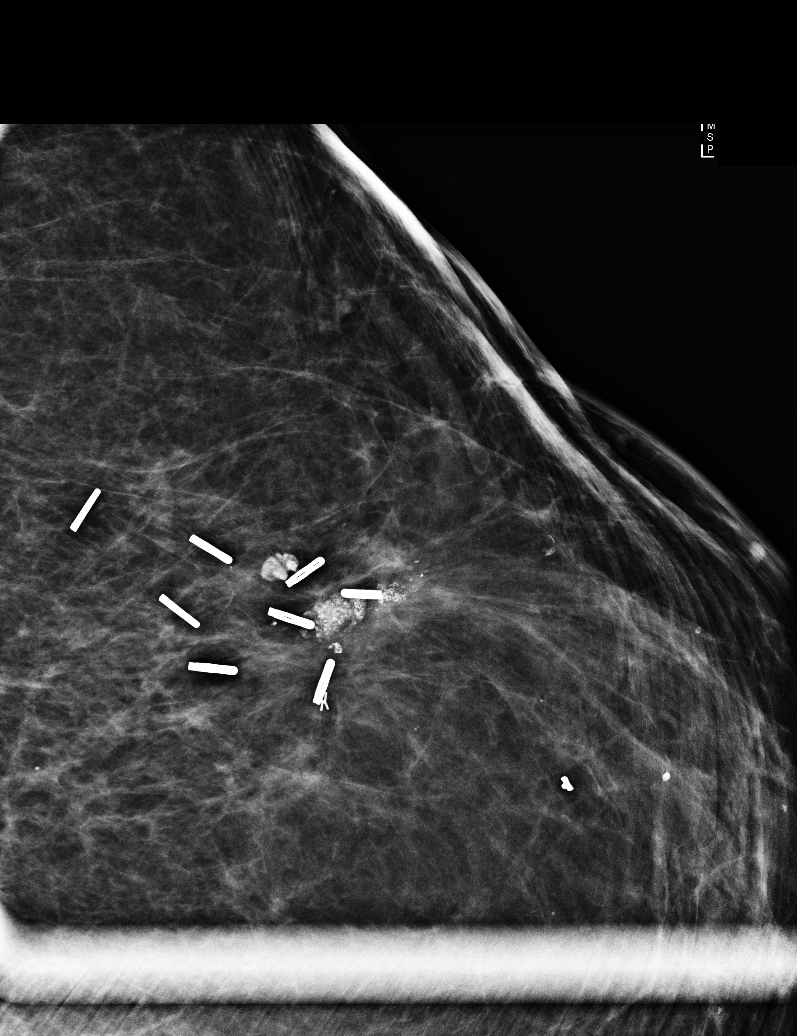

[R MLO synth-2D]
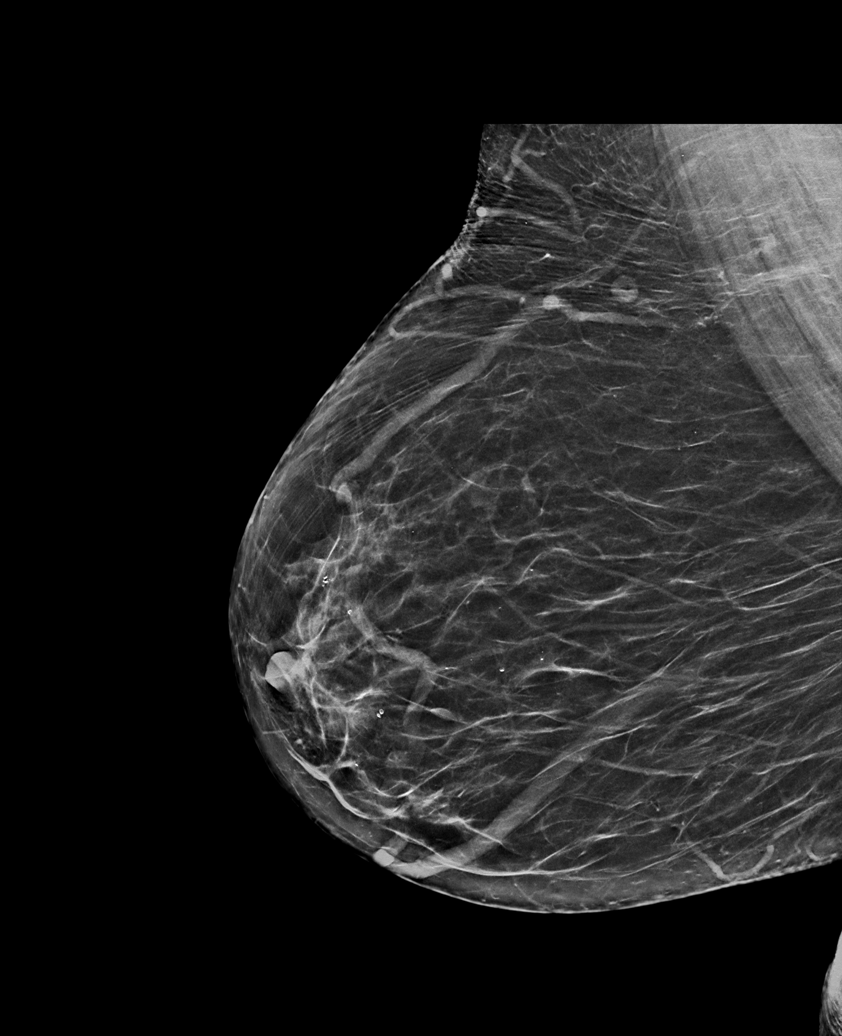

[L MLO synth-2D]
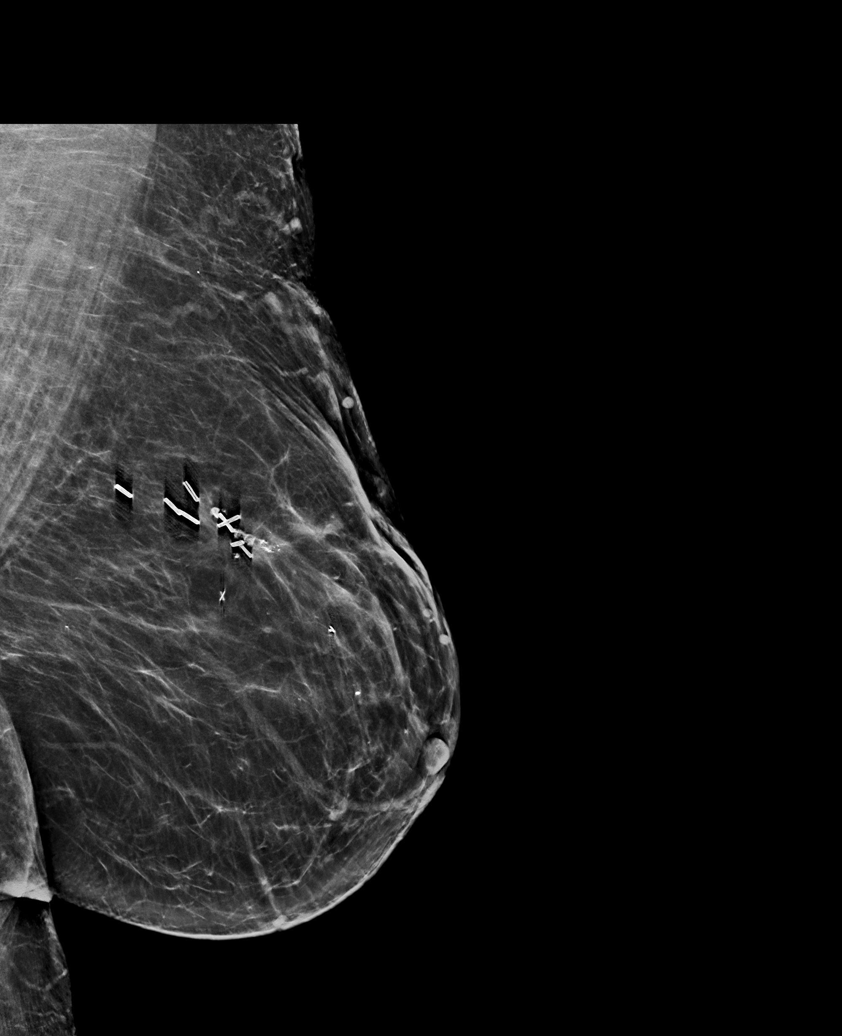

[L CC synth-2D]
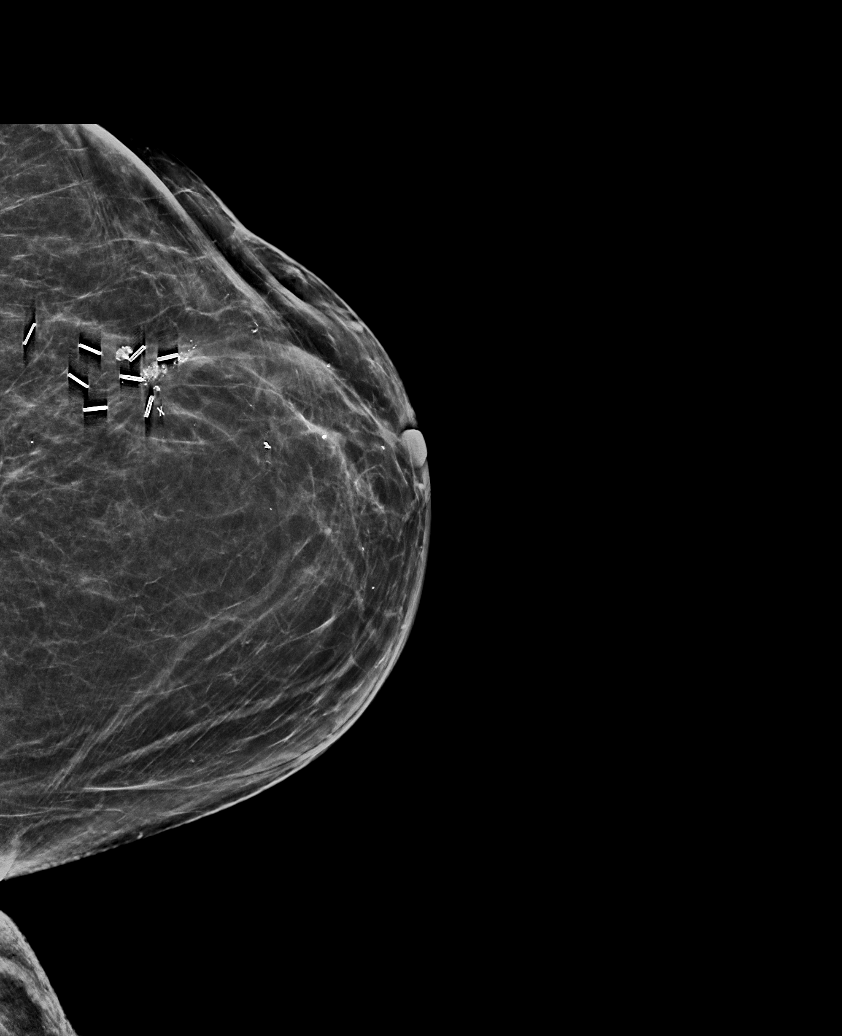

[R CC synth-2D]
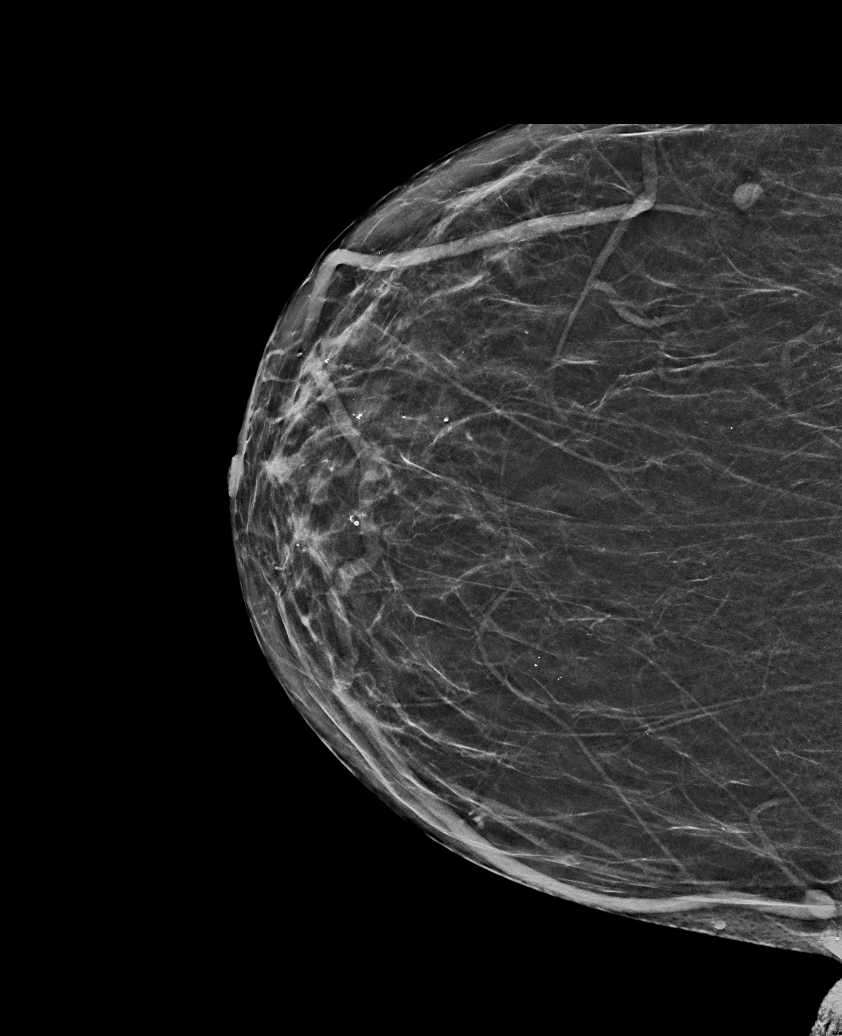

[L CC tomo · tomo slice 32/63.0]
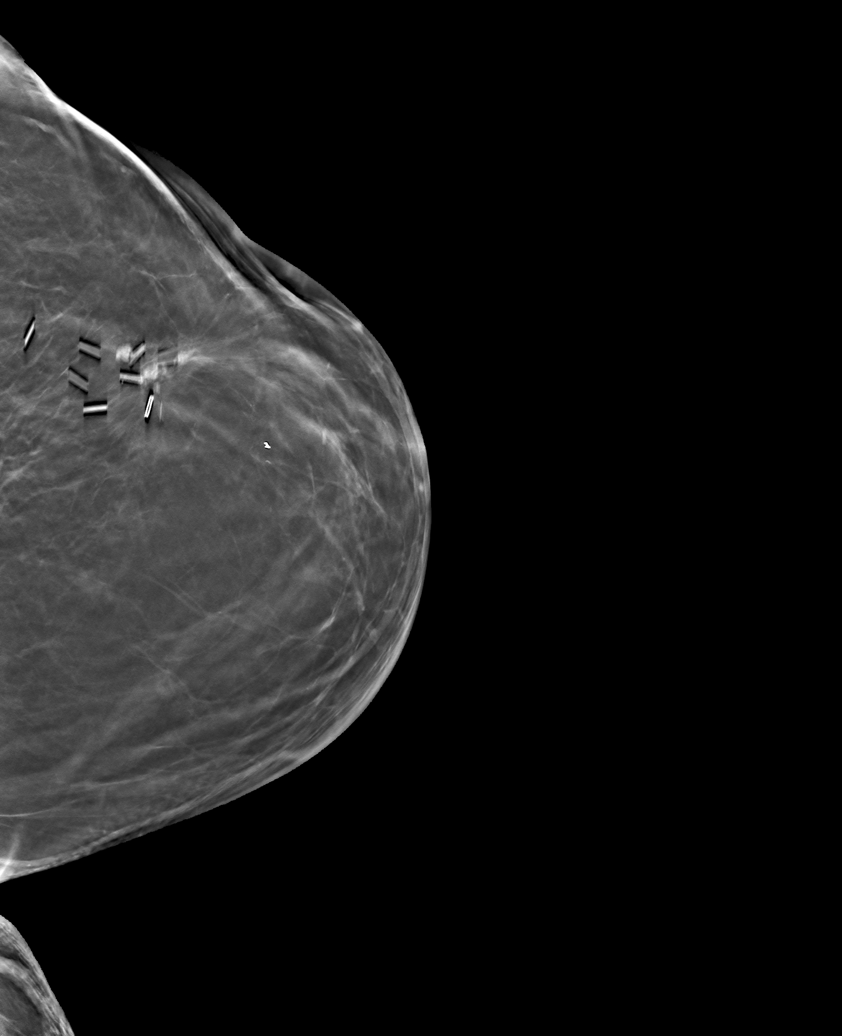

[6 of 25 positions shown; findings below may reference images not displayed]

ACR Breast Density Category b: There are scattered areas of
fibroglandular density.
FINDINGS: Stable post lumpectomy changes on the left. No interval findings
suspicious malignancy in either breast.

Mammographic images were processed with CAD.
IMPRESSION: No evidence of malignancy.

RECOMMENDATION:
Bilateral screening mammogram in year.

I have discussed the findings and recommendations with the patient.
If applicable, a reminder letter will be sent to the patient
regarding the next appointment.

BI-RADS CATEGORY  2: Benign.

## 2020-07-27 ENCOUNTER — Ambulatory Visit: Payer: Medicare HMO | Admitting: Orthopedic Surgery

## 2020-08-04 ENCOUNTER — Other Ambulatory Visit: Payer: Self-pay

## 2020-08-04 ENCOUNTER — Inpatient Hospital Stay (HOSPITAL_COMMUNITY): Payer: Medicare HMO | Attending: Hematology

## 2020-08-04 DIAGNOSIS — F17200 Nicotine dependence, unspecified, uncomplicated: Secondary | ICD-10-CM

## 2020-08-04 DIAGNOSIS — Z79810 Long term (current) use of selective estrogen receptor modulators (SERMs): Secondary | ICD-10-CM | POA: Diagnosis not present

## 2020-08-04 DIAGNOSIS — C50412 Malignant neoplasm of upper-outer quadrant of left female breast: Secondary | ICD-10-CM | POA: Diagnosis not present

## 2020-08-04 DIAGNOSIS — Z923 Personal history of irradiation: Secondary | ICD-10-CM | POA: Insufficient documentation

## 2020-08-04 DIAGNOSIS — Z9221 Personal history of antineoplastic chemotherapy: Secondary | ICD-10-CM | POA: Insufficient documentation

## 2020-08-04 DIAGNOSIS — Z170 Estrogen receptor positive status [ER+]: Secondary | ICD-10-CM

## 2020-08-04 DIAGNOSIS — M8580 Other specified disorders of bone density and structure, unspecified site: Secondary | ICD-10-CM | POA: Diagnosis not present

## 2020-08-04 DIAGNOSIS — E559 Vitamin D deficiency, unspecified: Secondary | ICD-10-CM | POA: Diagnosis not present

## 2020-08-04 LAB — COMPREHENSIVE METABOLIC PANEL
ALT: 20 U/L (ref 0–44)
AST: 18 U/L (ref 15–41)
Albumin: 3.8 g/dL (ref 3.5–5.0)
Alkaline Phosphatase: 67 U/L (ref 38–126)
Anion gap: 9 (ref 5–15)
BUN: 15 mg/dL (ref 8–23)
CO2: 30 mmol/L (ref 22–32)
Calcium: 9.3 mg/dL (ref 8.9–10.3)
Chloride: 100 mmol/L (ref 98–111)
Creatinine, Ser: 0.85 mg/dL (ref 0.44–1.00)
GFR, Estimated: >60 mL/min (ref >60)
Glucose, Bld: 110 mg/dL — ABNORMAL HIGH (ref 70–99)
Potassium: 3.7 mmol/L (ref 3.5–5.1)
Sodium: 139 mmol/L (ref 135–145)
Total Bilirubin: 1.1 mg/dL (ref 0.3–1.2)
Total Protein: 7 g/dL (ref 6.5–8.1)

## 2020-08-04 LAB — CBC WITH DIFFERENTIAL/PLATELET
Abs Immature Granulocytes: 0.03 10*3/uL (ref 0.00–0.07)
Basophils Absolute: 0 10*3/uL (ref 0.0–0.1)
Basophils Relative: 1 %
Eosinophils Absolute: 0.2 10*3/uL (ref 0.0–0.5)
Eosinophils Relative: 3 %
HCT: 52.3 % — ABNORMAL HIGH (ref 36.0–46.0)
Hemoglobin: 17.5 g/dL — ABNORMAL HIGH (ref 12.0–15.0)
Immature Granulocytes: 0 %
Lymphocytes Relative: 20 %
Lymphs Abs: 1.5 10*3/uL (ref 0.7–4.0)
MCH: 31.4 pg (ref 26.0–34.0)
MCHC: 33.5 g/dL (ref 30.0–36.0)
MCV: 93.9 fL (ref 80.0–100.0)
Monocytes Absolute: 0.4 10*3/uL (ref 0.1–1.0)
Monocytes Relative: 5 %
Neutro Abs: 5.4 10*3/uL (ref 1.7–7.7)
Neutrophils Relative %: 71 %
Platelets: 208 10*3/uL (ref 150–400)
RBC: 5.57 MIL/uL — ABNORMAL HIGH (ref 3.87–5.11)
RDW: 12 % (ref 11.5–15.5)
WBC: 7.5 10*3/uL (ref 4.0–10.5)
nRBC: 0 % (ref 0.0–0.2)

## 2020-08-04 LAB — VITAMIN D 25 HYDROXY (VIT D DEFICIENCY, FRACTURES): Vit D, 25-Hydroxy: 36.76 ng/mL (ref 30–100)

## 2020-08-05 LAB — CANCER ANTIGEN 15-3: CA 15-3: 8.6 U/mL (ref 0.0–25.0)

## 2020-08-11 ENCOUNTER — Ambulatory Visit (HOSPITAL_COMMUNITY): Payer: Medicare HMO | Admitting: Hematology

## 2020-08-27 ENCOUNTER — Ambulatory Visit: Payer: Medicare HMO | Admitting: Orthopaedic Surgery

## 2020-08-27 ENCOUNTER — Other Ambulatory Visit: Payer: Self-pay

## 2020-09-10 ENCOUNTER — Ambulatory Visit (INDEPENDENT_AMBULATORY_CARE_PROVIDER_SITE_OTHER): Payer: Medicare HMO | Admitting: Orthopaedic Surgery

## 2020-09-10 ENCOUNTER — Ambulatory Visit (INDEPENDENT_AMBULATORY_CARE_PROVIDER_SITE_OTHER): Payer: Medicare HMO

## 2020-09-10 ENCOUNTER — Other Ambulatory Visit: Payer: Self-pay

## 2020-09-10 ENCOUNTER — Encounter: Payer: Self-pay | Admitting: Orthopaedic Surgery

## 2020-09-10 VITALS — Ht 64.0 in | Wt 230.0 lb

## 2020-09-10 DIAGNOSIS — M25551 Pain in right hip: Secondary | ICD-10-CM

## 2020-09-10 DIAGNOSIS — M545 Low back pain, unspecified: Secondary | ICD-10-CM

## 2020-09-10 DIAGNOSIS — F17200 Nicotine dependence, unspecified, uncomplicated: Secondary | ICD-10-CM | POA: Diagnosis not present

## 2020-09-10 DIAGNOSIS — G8929 Other chronic pain: Secondary | ICD-10-CM

## 2020-09-10 NOTE — Progress Notes (Signed)
Office Visit Note   Patient: Regina Mcmahon           Date of Birth: 10-25-1950           MRN: [id number] Visit Date: 09/10/2020              Requested by: [name], MD [place],  [place] [address] PCP: [name], MD   Assessment & Plan: Visit Diagnoses:  1. Pain in right hip   2. Chronic bilateral low back pain, unspecified whether sciatica present     Plan: Patient will need to be seen by pulmonary and get pulmonary function tests preoperatively as well as surgical approval for surgery by Dr. Nevada Crane.  She could have direct anterior right total of arthroplasty under spinal anesthesia.  We discussed smoking sensation.  Recommended Covid vaccination.  Follow-Up Instructions: No follow-ups on file.   Orders:  Orders Placed This Encounter  Procedures  . XR Lumbar Spine Complete  . XR HIP UNILAT W OR W/O PELVIS 2-3 VIEWS RIGHT   No orders of the defined types were placed in this encounter.     Procedures: No procedures performed   Clinical Data: No additional findings.   Subjective: Chief Complaint  Patient presents with  . Lower Back - Pain  . Right Hip - Pain    HPI 70 year old female with severe right hip osteoarthritis.  She has been seeing Dr. Arther Abbott and has lost some weight 6 pounds since November to get her BMI below 40.  She is a smoker and has had problems with back pain previous epidural injection.  She is taken anti-inflammatories ibuprofen.  She has been on gabapentin which just made her sleepy she is used Tylenol 3 for her hip.  She is a long-term smoker and states she is not been able to quit and is not really sure if she wants to quit.  She has tried patches and Nicorette gum in the past.  Epidural injections gave her some relief for just couple days if any.  Hip injection gave her good relief but only lasted 3 days.  Plain radiograph shows severe erosive hip osteoarthritis on the right normal left hip.  Patient is here with  her sister and is asking if she could have her hip down the spinal anesthesia anterior approach which she knows other patients that have treated and have done well.  Pain clinic referral been discussed with patient in the past but patient does not want to start taking narcotic medication.  Additionally today we discussed recommendations that she should get Covid vaccination.  Sisters present has had Covid vaccination and booster.  Patient's principal concern has been the discussion about possible postop hip dislocation.  Review of Systems positive obesity previous morbid obesity.  COPD 1 pack/day long-term smoker.  Previous breast cancer 2015 treated with 38 radiation treatments 6 chemo.  No evidence of recurrence.  Positive for depression anxiety.  On alprazolam 0.5 mg 3 a day chronically.   Objective: Vital Signs: Ht 5\' 4"  (1.626 m)   Wt 230 lb (104.3 kg)   BMI 39.48 kg/m   Physical Exam Constitutional:      Appearance: She is well-developed.  HENT:     Head: Normocephalic.     Right Ear: External ear normal.     Left Ear: External ear normal.  Eyes:     Pupils: Pupils are equal, round, and reactive to light.  Neck:  Thyroid: No thyromegaly.     Trachea: No tracheal deviation.  Cardiovascular:     Rate and Rhythm: Normal rate.  Pulmonary:     Effort: Pulmonary effort is normal.  Abdominal:     Palpations: Abdomen is soft.  Skin:    General: Skin is warm and dry.  Neurological:     Mental Status: She is alert and oriented to person, place, and time.  Psychiatric:        Mood and Affect: Mood and affect normal.        Behavior: Behavior normal.     Ortho Exam patient is ambulatory with a cane cannot ambulate without a cane.  She has a walker at home.  Positive Trendelenburg gait.  She has 10 degrees internal rotation of her hip with severe pain external rotation 20 degrees right hip with severe pain.  Opposite left hip has good internal and external rotation without  discomfort.  20 degree hip flexion contracture right hip.  Knee reaches full extension distal pulses are palpable she has 1 cm shortening on the right side. Specialty Comments:  No specialty comments available.  Imaging: No results found.   PMFS History: Patient Active Problem List   Diagnosis Date Noted  . Shortness of breath 09/12/2016  . Well woman exam with routine gynecological exam 04/20/2016  . Morbid obesity ([place]) 03/03/2016  . Colon adenomas 03/03/2016  . Rash 01/18/2016  . GERD (gastroesophageal reflux disease) 10/19/2015  . Osteopenia determined by x-ray 09/07/2015  . High risk medication use 09/07/2015  . Genetic testing 03/20/2015  . Family history of ovarian cancer 03/20/2015  . Family history of colon cancer 03/20/2015  . Family history of cancer 03/20/2015  . 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 Anxiety 10/29/2012  . Blood in stool 10/29/2012  . Pain 10/29/2012  . Chronic airway obstruction ([place]) 10/29/2012  . Diarrhea 10/29/2012  . Tobacco use disorder 10/29/2012  . Essential hypertension 10/29/2012   Past Medical History:  Diagnosis Date  . Asthma   . Asymptomatic varicose veins   . Breast cancer ([place]) 06/2014   left  . Chronic airway obstruction ([place])   . COPD (chronic obstructive pulmonary disease) ([place])   . Depression   . Hemorrhage rectum    and anus.  . Hypertension   . Insomnia   . Other symptoms involving digestive system(787.99)   . Pain    Hx.pain in joint involving pelvic region and thigh; pain in limb  . Palpitations   . Panic attacks   . Personal history of chemotherapy 2016  . Personal history of radiation therapy 2016  . Sinusitis   . Symptomatic menopausal or female climacteric states     Family History  Problem Relation Age of Onset  . Diabetes Mother   . Ovarian cancer Mother 47  . Other Father        died in MVA at age 89  . Other Sister        mitral valve disorder  . Melanoma  Sister 54       removed from back of leg  . Colon cancer Brother        early 72s, succumbed to disease  . Cancer Paternal Aunt        leukemia, bone, or lung cancer  . Alzheimer's disease Maternal Grandmother   . Heart attack Maternal Grandfather   . Heart attack Paternal Grandmother   . COPD Paternal Grandfather   . Emphysema  Paternal Grandfather   . Congestive Heart Failure Paternal Aunt   . Stomach cancer Paternal Uncle   . Kidney cancer Maternal Uncle   . Cancer Cousin        dx. teens/dx. 40s  . Cancer Cousin   . Colon cancer Cousin     Past Surgical History:  Procedure Laterality Date  . BREAST BIOPSY Left 10/2012  . BREAST BIOPSY Left 07/01/2014   malignant  . BREAST LUMPECTOMY Left 08/20/2014  . CESAREAN SECTION    . CHOLECYSTECTOMY  1985  . COLONOSCOPY  2002   Dr. Lindalou Hose: internal hemorrhoids, one small rectal polyp, adenomatous path   . COLONOSCOPY N/A 11/23/2015   Procedure: COLONOSCOPY;  Surgeon: Danie Binder, MD;  [facility];  Service: Endoscopy;  Laterality: N/A;  1100 - moved to 10:45 - office to notify  . ESOPHAGOGASTRODUODENOSCOPY N/A 11/23/2015   Procedure: ESOPHAGOGASTRODUODENOSCOPY (EGD);  Surgeon: Danie Binder, MD;  [facility];  Service: Endoscopy;  Laterality: N/A;  . PORT-A-CATH REMOVAL  02/2015  . PORTACATH PLACEMENT Right 09/17/2014   Procedure: INSERTION PORT-A-CATH WITH ULTRASOUND;  Surgeon: Erroll Luna, MD;  Location: [facility];  Service: General;  Laterality: Right;  IJ  . RADIOACTIVE SEED GUIDED PARTIAL MASTECTOMY WITH AXILLARY SENTINEL LYMPH NODE BIOPSY Left 08/20/2014   Procedure: LEFT BREAST LUMPECTOMY WITH RADIOACTIVE SEED LOCALIZATION AND SENTINEL LYMPH NODE MAPPING;  Surgeon: Erroll Luna, MD;  Location: [facility];  Service: General;  Laterality: Left;  . RE-EXCISION OF BREAST LUMPECTOMY Left 09/17/2014   Procedure: RE-EXCISION OF BREAST LUMPECTOMY;  Surgeon: Erroll Luna, MD;   Location: [facility];  Service: General;  Laterality: Left;  . TUBAL LIGATION     Social History   Occupational History  . Not on file  Tobacco Use  . Smoking status: Current Every Day Smoker    Packs/day: 1.00    Types: Cigarettes    Last attempt to quit: 02/03/2016    Years since quitting: 4.6  . Smokeless tobacco: Never Used  Vaping Use  . Vaping Use: Never used  Substance and Sexual Activity  . Alcohol use: No    Alcohol/week: 0.0 standard drinks  . Drug use: No  . Sexual activity: Never    Birth control/protection: None

## 2020-09-16 ENCOUNTER — Other Ambulatory Visit: Payer: Self-pay | Admitting: Orthopedic Surgery

## 2020-09-16 DIAGNOSIS — M5136 Other intervertebral disc degeneration, lumbar region: Secondary | ICD-10-CM

## 2020-10-12 ENCOUNTER — Encounter: Payer: Self-pay | Admitting: Internal Medicine

## 2020-10-12 ENCOUNTER — Other Ambulatory Visit: Payer: Self-pay | Admitting: Internal Medicine

## 2020-10-12 ENCOUNTER — Ambulatory Visit: Payer: Medicare HMO | Admitting: Internal Medicine

## 2020-10-12 ENCOUNTER — Other Ambulatory Visit: Payer: Self-pay

## 2020-10-12 DIAGNOSIS — J449 Chronic obstructive pulmonary disease, unspecified: Secondary | ICD-10-CM | POA: Insufficient documentation

## 2020-10-12 DIAGNOSIS — F17210 Nicotine dependence, cigarettes, uncomplicated: Secondary | ICD-10-CM

## 2020-10-12 DIAGNOSIS — R69 Illness, unspecified: Secondary | ICD-10-CM | POA: Diagnosis not present

## 2020-10-12 NOTE — Assessment & Plan Note (Signed)
Active smoker limited by R hip pain >> doe as of 1st Pulmonary eval 10/12/2020   Most likely  Pt is Group B in terms of symptom/risk and laba/lama therefore appropriate rx at this point >>>  anoro appropriate  Also has CB component and really needs to quit smoking 2 weeks at least prior to surgery  (see separate a/p)   saba prn  I spent extra time with pt today reviewing appropriate use of albuterol for prn use on exertion with the following points: 1) saba is for relief of sob that does not improve by walking a slower pace or resting but rather if the pt does not improve after trying this first. 2) If the pt is convinced, as many are, that saba helps recover from activity faster then it's easy to tell if this is the case by re-challenging : ie stop, take the inhaler, then p 5 minutes try the exact same activity (intensity of workload) that just caused the symptoms and see if they are substantially diminished or not after saba 3) if there is an activity that reproducibly causes the symptoms, try the saba 15 min before the activity on alternate days   If in fact the saba really does help, then fine to continue to use it prn but advised may need to look closer at the maintenance regimen being used to achieve better control of airways disease with exertion.

## 2020-10-12 NOTE — Progress Notes (Signed)
Regina Mcmahon, female    DOB: May 26, 1951,     MRN: [id number]   Brief patient profile:  35 yowf active smoker on Tamoxifen since 2016 for Breast Ca needs clearance for R hip surgery referred to pulmonary clinic in [place]  10/12/2020 by Dr  Lorin Mercy.  Had seen DR Luan Pulling with dx of copd on anoro chronically but no pfts on file    History of Present Illness  10/12/2020  Pulmonary/ 1st office eval/ [facility] / Regina Mcmahon Office / anoro  Chief Complaint  Patient presents with  . 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 She needs risk assessment done to have total right hip replacement. She states her breathing seems stable as long as she stays still. She can not walk to her mailbox due to SOB and has to take frequent breaks when doing housework due to SOB. She uses her rescue inhaler a few times per wk.   Dyspnea:  Limited by R Hip pain more so than breathing 50 ft is about the limit  Cough: some am congestion /clear  Sleep: on side  Bed is flat / one pillow  SABA use: not much   No obvious day to day or daytime variability or assoc excess/ purulent sputum or mucus plugs or hemoptysis or cp or chest tightness, subjective wheeze or overt sinus or hb symptoms.   Sleeping fine as above without nocturnal  or early am exacerbation  of respiratory  c/o's or need for noct saba. Also denies any obvious fluctuation of symptoms with weather or environmental changes or other aggravating or alleviating factors except as outlined above   No unusual exposure hx or h/o childhood pna/ asthma or knowledge of premature birth.  Current Allergies, Complete Past Medical History, Past Surgical History, Family History, and Social History were reviewed in [employer] record.  ROS  The following are not active complaints unless bolded Hoarseness, sore throat, dysphagia, dental problems, itching, sneezing,  nasal congestion or discharge of excess mucus or purulent secretions, ear ache,   fever,  chills, sweats, unintended wt loss or wt gain, classically pleuritic or exertional cp,  orthopnea pnd or arm/hand swelling  or leg swelling, presyncope, palpitations, abdominal pain, anorexia, nausea, vomiting, diarrhea  or change in bowel habits or change in bladder habits, change in stools or change in urine, dysuria, hematuria,  rash, arthralgias, visual complaints, headache, numbness, weakness or ataxia or problems with walking or coordination,  change in mood or  memory.           Past Medical History:  Diagnosis Date  . Asthma   . Asymptomatic varicose veins   . Breast cancer ([place]) 06/2014   left  . Chronic airway obstruction ([place])   . COPD (chronic obstructive pulmonary disease) ([place])   . Depression   . Hemorrhage rectum    and anus.  . Hypertension   . Insomnia   . Other symptoms involving digestive system(787.99)   . Pain    Hx.pain in joint involving pelvic region and thigh; pain in limb  . Palpitations   . Panic attacks   . Personal history of chemotherapy 2016  . Personal history of radiation therapy 2016  . Sinusitis   . Symptomatic menopausal or female climacteric states     Outpatient Medications Prior to Visit  Medication Sig Dispense Refill  . albuterol (PROVENTIL HFA;VENTOLIN HFA) 108 (90 BASE) MCG/ACT inhaler Inhale 2 puffs into the lungs every 6 (six) hours as needed for  wheezing or shortness of breath.     . ALPRAZolam (XANAX) 0.5 MG tablet Take 0.5 mg by mouth 3 (three) times daily as needed for anxiety.     Regina Mcmahon ELLIPTA 62.5-25 MCG/INH AEPB 1 puff daily.    . calcium carbonate (TUMS - DOSED IN MG ELEMENTAL CALCIUM) 500 MG chewable tablet Chew 1 tablet by mouth 3 (three) times daily with meals. Reported on 12/07/2015    . furosemide (LASIX) 20 MG tablet Take 20 mg by mouth daily.    Marland Kitchen gabapentin (NEURONTIN) 300 MG capsule Take 1 capsule (300 mg total) by mouth 3 (three) times daily. 90 capsule 5  . hydrochlorothiazide (MICROZIDE) 12.5 MG capsule Take 12.5  mg by mouth daily. Reported on 12/07/2015    . ibuprofen (ADVIL) 800 MG tablet Take 800 mg by mouth 3 (three) times daily.    . pantoprazole (PROTONIX) 40 MG tablet Take 1 tablet by mouth once daily 90 tablet 0  . potassium citrate (UROCIT-K) 10 MEQ (1080 MG) SR tablet Take 10 mEq by mouth daily.    . sertraline (ZOLOFT) 100 MG tablet Take 100 mg by mouth daily.    . tamoxifen (NOLVADEX) 20 MG tablet Take 1 tablet (20 mg total) by mouth daily. 30 tablet 3  . acetaminophen-codeine (TYLENOL #3) 300-30 MG tablet Take 1 tablet by mouth every 6 (six) hours as needed for moderate pain. 30 tablet 0  . furosemide (LASIX) 20 MG tablet 11/26/19 Take 40 mg twice a day for 1 week, then go back to 40 mg daily 40 tablet 3  . potassium chloride SA (KLOR-CON M20) 20 MEQ tablet Take 1 tablet (20 mEq total) by mouth 2 (two) times daily. 180 tablet 3  . traMADol-acetaminophen (ULTRACET) 37.5-325 MG tablet Take 1 tablet by mouth every 4 (four) hours as needed. 90 tablet 5   No facility-administered medications prior to visit.     Objective:     BP 126/90 (BP Location: Left Arm, Cuff Size: Normal)   Pulse 73   Temp (!) 97.1 F (36.2 C) (Temporal)   Ht 5\' 3"  (1.6 m)   Wt 227 lb (103 kg)   SpO2 94% Comment: on RA  BMI 40.21 kg/m   SpO2: 94 % (on RA)   MO (by BMI)  amb wf nad   HEENT : pt wearing mask not removed for exam due to covid -19 concerns.    NECK :  without JVD/Nodes/TM/ nl carotid upstrokes bilaterally   LUNGS: no acc muscle use,  Mod barrel  contour chest wall with bilateral  Distant bs s audible wheeze and  without cough on insp or exp maneuvers and mod  Hyperresonant  to  percussion bilaterally     CV:  RRR  no s3 or murmur or increase in P2, and no edema   ABD:  Obese soft and nontender with pos mid insp Hoover's  in the supine position. No bruits or organomegaly appreciated, bowel sounds nl  MS:     ext warm without deformities, calf tenderness, cyanosis or clubbing No obvious  joint restrictions   SKIN: warm and dry without lesions    NEURO:  alert, approp, nl sensorium with  no motor or cerebellar deficits apparent.    CXR PA and Lateral:   10/12/2020 :    I personally reviewed images and agree with radiology impression as follows:               Assessment   COPD GOLD ?  /  still smoking  Active smoker limited by R hip pain >> doe as of 1st Pulmonary eval 10/12/2020   Most likely  Pt is Group B in terms of symptom/risk and laba/lama therefore appropriate rx at this point >>>  anoro appropriate  Also has CB component and really needs to quit smoking 2 weeks at least prior to surgery  (see separate a/p)   saba prn  I spent extra time with pt today reviewing appropriate use of albuterol for prn use on exertion with the following points: 1) saba is for relief of sob that does not improve by walking a slower pace or resting but rather if the pt does not improve after trying this first. 2) If the pt is convinced, as many are, that saba helps recover from activity faster then it's easy to tell if this is the case by re-challenging : ie stop, take the inhaler, then p 5 minutes try the exact same activity (intensity of workload) that just caused the symptoms and see if they are substantially diminished or not after saba 3) if there is an activity that reproducibly causes the symptoms, try the saba 15 min before the activity on alternate days   If in fact the saba really does help, then fine to continue to use it prn but advised may need to look closer at the maintenance regimen being used to achieve better control of airways disease with exertion.      Morbid obesity due to excess calories (HCC) Body mass index is 40.21 kg/m.  -   No results found for: TSH   Contributing to doe and PE risk reviewed the need and the process to achieve and maintain neg calorie balance > defer f/u primary care including intermittently monitoring thyroid status   >>> greatest  concern with surgery is obesity risk = OHS / atx/ PE can be offset with early mobilization / IS and minimal post op analgesia.  >>> there is no "red line" in terms of other preop conditions for this pt but ideally needs PFT / cxr preop if can arrange it and obviously try to avoid GA if possible / only use it as a backup   Discussed in detail all the  indications, usual  risks and alternatives  relative to the benefits with patient who agrees to proceed with Rx as outlined.            Each maintenance medication was reviewed in detail including emphasizing most importantly the difference between maintenance and prns and under what circumstances the prns are to be triggered using an action plan format where appropriate.  Total time for H and P, chart review, counseling, reviewing elpita/hfa device(s) and generating customized AVS unique to this office visit / same day charting = 52 min           Cigarette smoker 4-5 min discussion re active cigarette smoking in addition to office E&M  Ask about tobacco use:   ongoing Advise quitting   Advised on urgency of stop smoking pre op  Assess willingness:  ?  committed at this point (even for surgery upcoming_) Assist in quit attempt:  Per PCP when ready Arrange follow up:   Follow up per Primary Care planned        Christinia Gully, MD 10/12/2020

## 2020-10-12 NOTE — Assessment & Plan Note (Addendum)
Body mass index is 40.21 kg/m.  -   No results found for: TSH   Contributing to doe and PE risk reviewed the need and the process to achieve and maintain neg calorie balance > defer f/u primary care including intermittently monitoring thyroid status   >>> greatest concern with surgery is obesity risk = OHS / atx/ PE can be offset with early mobilization / IS and minimal post op analgesia.  >>> there is no "red line" in terms of other preop conditions for this pt but ideally needs PFT / cxr preop if can arrange it and obviously try to avoid GA if possible / only use it as a backup   Discussed in detail all the  indications, usual  risks and alternatives  relative to the benefits with patient who agrees to proceed with Rx as outlined.               Each maintenance medication was reviewed in detail including emphasizing most importantly the difference between maintenance and prns and under what circumstances the prns are to be triggered using an action plan format where appropriate.  Total time for H and P, chart review, counseling, reviewing elpita/hfa device(s) and generating customized AVS unique to this office visit / same day charting = 52 min

## 2020-10-12 NOTE — Patient Instructions (Addendum)
Please consider stopping all smoking x 2 weeks pre-op, this is the most important aspect of your care.  Consider stopping the Tamoxifen between now and surgery due to risk of clots   Continue Anoro but take it before you go to hospital for surgery and each am thereafter   Only use your albuterol as a rescue medication to be used if you can't catch your breath by resting or doing a relaxed purse lip breathing pattern.  - The less you use it, the better it will work when you need it. - Ok to use up to 2 puffs  every 4 hours if you must but call for immediate appointment if use goes up over your usual need - Don't leave home without it !!  (think of it like the spare tire for your car)   Try albuterol 15 min before an activity that you know would make you short of breath and see if it makes any difference and if makes none then don't take it after activity unless you can't catch your breath.  We will schedule you for PFTS asap    Please schedule a follow up visit in 3 months but call sooner if needed

## 2020-10-12 NOTE — Assessment & Plan Note (Signed)
4-5 min discussion re active cigarette smoking in addition to office E&M  Ask about tobacco use:   ongoing Advise quitting   Advised on urgency of stop smoking pre op  Assess willingness:  ?  committed at this point (even for surgery upcoming_) Assist in quit attempt:  Per PCP when ready Arrange follow up:   Follow up per Primary Care planned

## 2020-10-20 ENCOUNTER — Telehealth: Payer: Self-pay

## 2020-11-05 ENCOUNTER — Telehealth: Payer: Self-pay | Admitting: Internal Medicine

## 2020-11-05 NOTE — Telephone Encounter (Signed)
I have called and LM on VM for the office of Dr. Nevada Crane for them to call back to triage.

## 2020-11-09 ENCOUNTER — Telehealth: Payer: Self-pay | Admitting: Internal Medicine

## 2020-11-09 NOTE — Telephone Encounter (Signed)
Pt called back to schedule the pft. Please call her back @ [phone]

## 2020-11-09 NOTE — Telephone Encounter (Signed)
Please see other encounter from last week.

## 2020-11-09 NOTE — Telephone Encounter (Signed)
Per patient's chart, she was contacted on 3/21 to get the PFT scheduled and a VM was left.   Called patient but she did not answer again. Left message for her to call back to get the PFT and covid test scheduled.

## 2020-11-10 NOTE — Telephone Encounter (Signed)
lmtcb for pt. Pt needs to be scheduled for PFT and COVID test.

## 2020-11-10 NOTE — Telephone Encounter (Signed)
I let her know that we do have order for pft and that  pt has been contacted a/b setting up appt but is not responding.Regina Mcmahon

## 2020-11-10 NOTE — Telephone Encounter (Signed)
Regina Mcmahon Returning call and can be reached @ [phone].Hillery Hunter

## 2020-11-11 NOTE — Telephone Encounter (Signed)
It looks like patient was reached and scheduled for covid screening on 11/20/2020 and pft on 11/24/20 both at [facility]. It does not appear anything else needs to be done. Nothing further needed at this time.

## 2020-11-20 ENCOUNTER — Other Ambulatory Visit: Payer: Self-pay

## 2020-11-20 ENCOUNTER — Other Ambulatory Visit (HOSPITAL_COMMUNITY)
Admission: RE | Admit: 2020-11-20 | Discharge: 2020-11-20 | Disposition: A | Discharge status: Home / Self Care | Payer: Medicare HMO | Source: Ambulatory Visit | Attending: Internal Medicine | Admitting: Internal Medicine

## 2020-11-20 DIAGNOSIS — Z20822 Contact with and (suspected) exposure to covid-19: Secondary | ICD-10-CM | POA: Insufficient documentation

## 2020-11-20 DIAGNOSIS — Z01812 Encounter for preprocedural laboratory examination: Secondary | ICD-10-CM | POA: Diagnosis not present

## 2020-11-21 LAB — SARS CORONAVIRUS 2 (TAT 6-24 HRS): SARS Coronavirus 2: NEGATIVE

## 2020-11-24 ENCOUNTER — Ambulatory Visit (HOSPITAL_COMMUNITY)
Admission: RE | Admit: 2020-11-24 | Discharge: 2020-11-24 | Disposition: A | Discharge status: Home / Self Care | Payer: Medicare HMO | Source: Ambulatory Visit | Attending: Internal Medicine | Admitting: Internal Medicine

## 2020-11-24 ENCOUNTER — Other Ambulatory Visit: Payer: Self-pay

## 2020-11-24 DIAGNOSIS — J449 Chronic obstructive pulmonary disease, unspecified: Secondary | ICD-10-CM | POA: Insufficient documentation

## 2020-11-24 LAB — PULMONARY FUNCTION TEST
DL/VA % pred: 57 %
DL/VA: 2.38 ml/min/mmHg/L
DLCO unc % pred: 42 %
DLCO unc: 8.42 ml/min/mmHg
FEF 25-75 Post: 0.58 L/sec
FEF 25-75 Pre: 0.71 L/sec
FEF2575-%Change-Post: -17 %
FEF2575-%Pred-Post: 28 %
FEF2575-%Pred-Pre: 34 %
FEV1-%Change-Post: -5 %
FEV1-%Pred-Post: 63 %
FEV1-%Pred-Pre: 67 %
FEV1-Post: 1.51 L
FEV1-Pre: 1.59 L
FEV1FVC-%Change-Post: -4 %
FEV1FVC-%Pred-Pre: 79 %
FEV6-%Change-Post: -1 %
FEV6-%Pred-Post: 82 %
FEV6-%Pred-Pre: 83 %
FEV6-Post: 2.45 L
FEV6-Pre: 2.49 L
FEV6FVC-%Change-Post: 0 %
FEV6FVC-%Pred-Post: 97 %
FEV6FVC-%Pred-Pre: 98 %
FVC-%Change-Post: 0 %
FVC-%Pred-Post: 84 %
FVC-%Pred-Pre: 84 %
FVC-Post: 2.61 L
FVC-Pre: 2.63 L
Post FEV1/FVC ratio: 58 %
Post FEV6/FVC ratio: 94 %
Pre FEV1/FVC ratio: 61 %
Pre FEV6/FVC Ratio: 95 %
RV % pred: 178 %
RV: 3.81 L
TLC % pred: 129 %
TLC: 6.52 L

## 2020-11-24 MED ORDER — ALBUTEROL SULFATE (2.5 MG/3ML) 0.083% IN NEBU
2.5000 mg | INHALATION_SOLUTION | Freq: Once | RESPIRATORY_TRACT | Status: AC
  Administered 2020-11-24: 2.5 mg via RESPIRATORY_TRACT

## 2020-11-30 NOTE — Progress Notes (Signed)
Spoke with the pt and notified of results and she verbalized understanding. Appt scheduled for [place] office.

## 2021-01-11 ENCOUNTER — Other Ambulatory Visit: Payer: Self-pay

## 2021-01-11 ENCOUNTER — Encounter: Payer: Self-pay | Admitting: Internal Medicine

## 2021-01-11 ENCOUNTER — Ambulatory Visit: Payer: Medicare HMO | Admitting: Internal Medicine

## 2021-01-11 DIAGNOSIS — F17210 Nicotine dependence, cigarettes, uncomplicated: Secondary | ICD-10-CM

## 2021-01-11 DIAGNOSIS — J449 Chronic obstructive pulmonary disease, unspecified: Secondary | ICD-10-CM | POA: Diagnosis not present

## 2021-01-11 DIAGNOSIS — R69 Illness, unspecified: Secondary | ICD-10-CM | POA: Diagnosis not present

## 2021-01-11 MED ORDER — AZITHROMYCIN 250 MG PO TABS: ORAL_TABLET | ORAL | 0 refills | Status: DC

## 2021-01-11 NOTE — Patient Instructions (Signed)
Zpak   Ideally quit smoking 2 weeks prior to surgery   Please schedule a follow up visit in 6 months but call sooner if needed

## 2021-01-11 NOTE — Progress Notes (Signed)
Regina Mcmahon, female    DOB: [birth year],     MRN: [id number]   Brief patient profile:  89 yowf active smoker on Tamoxifen since 2016 for Breast Ca needs clearance for R hip surgery referred to pulmonary clinic in [place]  10/12/2020 by Dr  Lorin Mercy.  Had seen DR Luan Pulling with dx of copd on anoro chronically but no pfts on file    History of Present Illness  10/12/2020  Pulmonary/ 1st office eval/ Regina Mcmahon / [facility] / anoro  Chief Complaint  Patient presents with   Pulmonary Consult    Former Dr Kathaleen Grinder pt. She needs risk assessment done to have total right hip replacement. She states her breathing seems stable as long as she stays still. She can not walk to her mailbox due to SOB and has to take frequent breaks when doing housework due to SOB. She uses her rescue inhaler a few times per wk.   Dyspnea:  Limited by R Hip pain more so than breathing 50 ft is about the limit  Cough: some am congestion /clear  Sleep: on side  Bed is flat / one pillow  SABA use: not much  Rec Please consider stopping all smoking x 2 weeks pre-op, this is the most important aspect of your care. Consider stopping the Tamoxifen between now and surgery due to risk of clots  Continue Anoro but take it before you go to hospital for surgery and each am thereafter  Only use your albuterol as a rescue medication Try albuterol 15 min before an activity that you know would make you short of breath      01/11/2021  f/u ov/[facility] office/Gisele Pack re: GOLD II copd still smoking Chief Complaint  Patient presents with   Follow-up    Breathing is unchanged since the last visit. She is using her albuterol inhaler 2 x daily on average.    Dyspnea:  50 ft ft more limited by R hip/ cane Cough: 2-3 hours of congestion each am / slt greenish Sleeping: on side, one pillow flat bed  SABA use: twice daily inhaler / not needing neb 02: none  Covid status: never vaccinated      No obvious day to day or daytime  variability or assoc excess/ purulent sputum or mucus plugs or hemoptysis or cp or chest tightness, subjective wheeze or overt sinus or hb symptoms.   sleeping without nocturnal     exacerbation  of respiratory  c/o's or need for noct saba. Also denies any obvious fluctuation of symptoms with weather or environmental changes or other aggravating or alleviating factors except as outlined above   No unusual exposure hx or h/o childhood pna/ asthma or knowledge of premature birth.  Current Allergies, Complete Past Medical History, Past Surgical History, Family History, and Social History were reviewed in [employer] record.  ROS  The following are not active complaints unless bolded Hoarseness, sore throat, dysphagia, dental problems, itching, sneezing,  nasal congestion or discharge of excess mucus or purulent secretions, ear ache,   fever, chills, sweats, unintended wt loss or wt gain, classically pleuritic or exertional cp,  orthopnea pnd or arm/hand swelling  or leg swelling, presyncope, palpitations, abdominal pain, anorexia, nausea, vomiting, diarrhea  or change in bowel habits or change in bladder habits, change in stools or change in urine, dysuria, hematuria,  rash, arthralgias, visual complaints, headache, numbness, weakness or ataxia or problems with walking or coordination,  change in mood or  memory.  Current Meds  Medication Sig   albuterol (PROVENTIL HFA;VENTOLIN HFA) 108 (90 BASE) MCG/ACT inhaler Inhale 2 puffs into the lungs every 6 (six) hours as needed for wheezing or shortness of breath.    ALPRAZolam (XANAX) 0.5 MG tablet Take 0.5 mg by mouth 3 (three) times daily as needed for anxiety.    ANORO ELLIPTA 62.5-25 MCG/INH AEPB 1 puff daily.   calcium carbonate (TUMS - DOSED IN MG ELEMENTAL CALCIUM) 500 MG chewable tablet Chew 1 tablet by mouth 3 (three) times daily with meals. Reported on 12/07/2015   furosemide (LASIX) 20 MG tablet Take 20 mg by mouth  daily.   gabapentin (NEURONTIN) 300 MG capsule Take 1 capsule (300 mg total) by mouth 3 (three) times daily.   hydrochlorothiazide (MICROZIDE) 12.5 MG capsule Take 12.5 mg by mouth daily. Reported on 12/07/2015   ibuprofen (ADVIL) 800 MG tablet Take 800 mg by mouth 3 (three) times daily.   pantoprazole (PROTONIX) 40 MG tablet Take 1 tablet by mouth once daily   potassium citrate (UROCIT-K) 10 MEQ (1080 MG) SR tablet Take 10 mEq by mouth daily.   sertraline (ZOLOFT) 100 MG tablet Take 100 mg by mouth daily.                    Past Medical History:  Diagnosis Date   Asthma    Asymptomatic varicose veins    Breast cancer ([place]) 06/2014   left   Chronic airway obstruction (HCC)    COPD (chronic obstructive pulmonary disease) (HCC)    Depression    Hemorrhage rectum    and anus.   Hypertension    Insomnia    Other symptoms involving digestive system(787.99)    Pain    Hx.pain in joint involving pelvic region and thigh; pain in limb   Palpitations    Panic attacks    Personal history of chemotherapy 2016   Personal history of radiation therapy 2016   Sinusitis    Symptomatic menopausal or female climacteric states        Objective:       Wt Readings from Last 3 Encounters:  01/11/21 220 lb (99.8 kg)  10/12/20 227 lb (103 kg)  09/10/20 230 lb (104.3 kg)     Vital signs reviewed  01/11/2021  - Note at rest 02 sats  100% on RA   General appearance:    amb obese wf nad      HEENT : pt wearing mask not removed for exam due to covid -19 concerns.    NECK :  without JVD/Nodes/TM/ nl carotid upstrokes bilaterally   LUNGS: no acc muscle use,  Mod barrel  contour chest wall with bilateral  Distant bs s audible wheeze and  without cough on insp or exp maneuvers and mod  Hyperresonant  to  percussion bilaterally     CV:  RRR  no s3 or murmur or increase in P2, and no edema   ABD:  obese soft and nontender with pos mid insp Hoover's  in the supine position. No bruits or  organomegaly appreciated, bowel sounds nl  MS:     ext warm without deformities, calf tenderness, cyanosis or clubbing No obvious joint restrictions   SKIN: warm and dry without lesions    NEURO:  alert, approp, nl sensorium with  no motor or cerebellar deficits apparent.                 Assessment

## 2021-01-11 NOTE — Assessment & Plan Note (Addendum)
Active smoker limited by R hip pain >> doe as of 1st Pulmonary eval 10/12/2020  - PFT's  11/24/20  FEV1 1.59 (67 % ) ratio 0.61  p 0 % improvement from saba p saba prior to study with DLCO  8.42 (42%) corrects to 2.38 (56%)  for alv volume and FV curve mild concavity and ERV 36% @ wt 226    Pt is Group B in terms of symptom/risk and laba/lama therefore appropriate rx at this point >>>  Continue anoro and prn saba  In absence of acute exac ok to proceed with hip surgery, ok for GA if necessary         Each maintenance medication was reviewed in detail including emphasizing most importantly the difference between maintenance and prns and under what circumstances the prns are to be triggered using an action plan format where appropriate.  Total time for H and P, chart review, counseling, reviewing dpi/hfa device(s) and generating customized AVS unique to this office visit / same day charting = 28 min        Discussed in detail all the  indications, usual  risks and alternatives  relative to the benefits with patient who agrees to proceed with Rx as outlined.

## 2021-01-11 NOTE — Assessment & Plan Note (Signed)
4-5 min discussion re active cigarette smoking in addition to office E&M  Ask about tobacco use:   ongoing Advise quitting  Needs to quit 2 weeks pre-op to reduce surg risk Assess willingness: Hard to commit but she will try  Assist in quit attempt:  Per PCP when ready Arrange follow up:   Follow up per Primary Care planned

## 2021-01-21 ENCOUNTER — Encounter: Payer: Self-pay | Admitting: Orthopaedic Surgery

## 2021-01-21 ENCOUNTER — Ambulatory Visit (INDEPENDENT_AMBULATORY_CARE_PROVIDER_SITE_OTHER): Payer: Medicare HMO | Admitting: Orthopaedic Surgery

## 2021-01-21 ENCOUNTER — Other Ambulatory Visit: Payer: Self-pay

## 2021-01-21 DIAGNOSIS — M1611 Unilateral primary osteoarthritis, right hip: Secondary | ICD-10-CM

## 2021-01-21 NOTE — Progress Notes (Signed)
Office Visit Note   Patient: Regina Mcmahon           Date of Birth: 04-30-1951           MRN: [id number] Visit Date: 01/21/2021              Requested by: [name], MD [place],  [place] [address] PCP: [name], MD   Assessment & Plan: Visit Diagnoses:  1. Unilateral primary osteoarthritis, right hip     Plan: X-rays in February showed severe right hip osteoarthritis bone-on-bone changes subchondral cyst formation marginal osteophyte subchondral sclerosis and no joint space.  She was 10 mm short due to erosive changes in the hip.  Opposite left hip was normal.  Patient asked about pain medication we discussed that did recommend she not take pain medication before the surgery so it works better after the surgery.  Plan would be spinal anesthesia, Exparel and Marcaine at the time of surgery overnight stay in the hospital.  She is here with her sister today and will need to make arrangements to have dealt staying with her for several days after surgery.  She has 5 steps to get up into her house.  She is not allowed due to housing standards to have a permanent ramp.  Questions elicited and answered with surgery operative technique discussed.  She understands and agrees to proceed.  Follow-Up Instructions: No follow-ups on file.   Orders:  No orders of the defined types were placed in this encounter.  No orders of the defined types were placed in this encounter.     Procedures: No procedures performed   Clinical Data: No additional findings.   Subjective: Chief Complaint  Patient presents with   Right Hip - Pain    HPI 70 year old female returns with progressive right hip osteoarthritis bone-on-bone changes.  Patient has known left hip osteoarthritis.  She is ambulated with a cane also a walker.  She has significant pain and had recent pulmonary function test documenting airway disease with Dr. Melvyn Novas. Patient has dieted and lost 12 pounds for surgery.   BMI is now 38.65. Review of Systems 14 point system positive for COPD, GERD.  History of breast cancer left breast.   Objective: Vital Signs: Ht 5\' 3"  (1.6 m)   Wt 218 lb 3.2 oz (99 kg)   BMI 38.65 kg/m   Physical Exam Constitutional:      Appearance: She is well-developed.  HENT:     Head: Normocephalic.     Right Ear: External ear normal.     Left Ear: External ear normal. There is no impacted cerumen.  Eyes:     Pupils: Pupils are equal, round, and reactive to light.  Neck:     Thyroid: No thyromegaly.     Trachea: No tracheal deviation.  Cardiovascular:     Rate and Rhythm: Normal rate.  Pulmonary:     Effort: Pulmonary effort is normal.  Abdominal:     Palpations: Abdomen is soft.  Musculoskeletal:     Cervical back: No rigidity.  Skin:    General: Skin is warm and dry.  Neurological:     Mental Status: She is alert and oriented to person, place, and time.  Psychiatric:        Behavior: Behavior normal.    Ortho Exam patient has positive right Trendelenburg gait.  She cannot ambulate without a cane.  10 degrees internal rotation right hip with severe pain.  Normal internal and external rotation left hip without pain or discomfort.  20 degree hip flexion contracture right hip.  Distal pulses are 2+.  Sensation the foot is normal.    Specialty Comments:  No specialty comments available.  Imaging: No results found.   PMFS History: Patient Active Problem List   Diagnosis Date Noted   Unilateral primary osteoarthritis, right hip 01/21/2021   COPD GOLD II  /  still smoking  10/12/2020   Shortness of breath 09/12/2016   Well woman exam with routine gynecological exam 04/20/2016   Morbid obesity due to excess calories ([place]) 03/03/2016   Colon adenomas 03/03/2016   Rash 01/18/2016   GERD (gastroesophageal reflux disease) 10/19/2015   Osteopenia determined by x-ray 09/07/2015   High risk medication use 09/07/2015   Genetic testing 03/20/2015   Family history of  ovarian cancer 03/20/2015   Family history of colon cancer 03/20/2015   Family history of cancer 03/20/2015   Carcinoma of upper-outer quadrant of left female breast ([place]) 09/03/2014   Anxiety 10/29/2012   Blood in stool 10/29/2012   Pain 10/29/2012   Chronic airway obstruction ([place]) 10/29/2012   Diarrhea 10/29/2012   Cigarette smoker 10/29/2012   Essential hypertension 10/29/2012   Past Medical History:  Diagnosis Date   Asthma    Asymptomatic varicose veins    Breast cancer ([place]) 06/2014   left   Chronic airway obstruction (HCC)    COPD (chronic obstructive pulmonary disease) (HCC)    Depression    Hemorrhage rectum    and anus.   Hypertension    Insomnia    Other symptoms involving digestive system(787.99)    Pain    Hx.pain in joint involving pelvic region and thigh; pain in limb   Palpitations    Panic attacks    Personal history of chemotherapy 2016   Personal history of radiation therapy 2016   Sinusitis    Symptomatic menopausal or female climacteric states     Family History  Problem Relation Age of Onset   Diabetes Mother    Ovarian cancer Mother 27   Other Father        died in MVA at age 25   Other Sister        mitral valve disorder   Melanoma Sister 38       removed from back of leg   Colon cancer Brother        early 35s, succumbed to disease   Cancer Paternal Aunt        leukemia, bone, or lung cancer   Alzheimer's disease Maternal Grandmother    Heart attack Maternal Grandfather    Heart attack Paternal Grandmother    COPD Paternal Grandfather    Emphysema Paternal Grandfather    Congestive Heart Failure Paternal Aunt    Stomach cancer Paternal Uncle    Kidney cancer Maternal Uncle    Cancer Cousin        dx. teens/dx. 44s   Cancer Cousin    Colon cancer Cousin     Past Surgical History:  Procedure Laterality Date   BREAST BIOPSY Left 10/2012   BREAST BIOPSY Left 07/01/2014   malignant   BREAST LUMPECTOMY Left 08/20/2014   CESAREAN  SECTION     CHOLECYSTECTOMY  1985   COLONOSCOPY  2002   Dr. Lindalou Hose: internal hemorrhoids, one small rectal polyp, adenomatous path    COLONOSCOPY N/A 11/23/2015   Procedure: COLONOSCOPY;  Surgeon: Danie Binder, MD;  [facility];  Service: Endoscopy;  Laterality: N/A;  1100 - moved to 10:45 - office to notify   ESOPHAGOGASTRODUODENOSCOPY N/A 11/23/2015   Procedure: ESOPHAGOGASTRODUODENOSCOPY (EGD);  Surgeon: Danie Binder, MD;  [facility];  Service: Endoscopy;  Laterality: N/A;   PORT-A-CATH REMOVAL  02/2015   PORTACATH PLACEMENT Right 09/17/2014   Procedure: INSERTION PORT-A-CATH WITH ULTRASOUND;  Surgeon: Erroll Luna, MD;  Location: [facility];  Service: General;  Laterality: Right;  IJ   RADIOACTIVE SEED GUIDED PARTIAL MASTECTOMY WITH AXILLARY SENTINEL LYMPH NODE BIOPSY Left 08/20/2014   Procedure: LEFT BREAST LUMPECTOMY WITH RADIOACTIVE SEED LOCALIZATION AND SENTINEL LYMPH NODE MAPPING;  Surgeon: Erroll Luna, MD;  Location: [facility];  Service: General;  Laterality: Left;   RE-EXCISION OF BREAST LUMPECTOMY Left 09/17/2014   Procedure: RE-EXCISION OF BREAST LUMPECTOMY;  Surgeon: Erroll Luna, MD;  Location: [facility];  Service: General;  Laterality: Left;   TUBAL LIGATION     Social History   Occupational History   Not on file  Tobacco Use   Smoking status: Every Day    Packs/day: 1.00    Years: 42.00    Pack years: 42.00    Types: Cigarettes   Smokeless tobacco: Never  Vaping Use   Vaping Use: Never used  Substance and Sexual Activity   Alcohol use: No    Alcohol/week: 0.0 standard drinks   Drug use: No   Sexual activity: Never    Birth control/protection: None

## 2021-01-28 ENCOUNTER — Other Ambulatory Visit: Payer: Self-pay

## 2021-02-11 ENCOUNTER — Ambulatory Visit (INDEPENDENT_AMBULATORY_CARE_PROVIDER_SITE_OTHER): Payer: Medicare HMO | Admitting: Surgery

## 2021-02-11 ENCOUNTER — Other Ambulatory Visit: Payer: Self-pay

## 2021-02-11 ENCOUNTER — Encounter: Payer: Self-pay | Admitting: Surgery

## 2021-02-11 VITALS — BP 130/73 | HR 69 | Ht 64.0 in | Wt 220.0 lb

## 2021-02-11 DIAGNOSIS — M1611 Unilateral primary osteoarthritis, right hip: Secondary | ICD-10-CM

## 2021-02-17 NOTE — Progress Notes (Signed)
Surgical Instructions    Your procedure is scheduled on August 1st - Monday.  Report to [facility] Main Entrance "A" at 1030 A.M., then check in with the Admitting office.  Call this number if you have problems the morning of surgery:  [phone]   If you have any questions prior to your surgery date call [phone]: Open Monday-Friday 8am-4pm    Remember:  Do not eat after midnight the night before your surgery  You may drink clear liquids until 9:30am the morning of your surgery.   Clear liquids allowed are: Water, Non-Citrus Juices (without pulp), Carbonated Beverages, Clear Tea, Black Coffee Only, and Gatorade    Take these medicines the morning of surgery with A SIP OF WATER  pantoprazole (PROTONIX) 40 MG tablet potassium citrate (UROCIT-K) 10 MEQ (1080 MG) SR tablet sertraline (ZOLOFT) 100 MG tablet  IF NEEDED albuterol (PROVENTIL HFA;VENTOLIN HFA) 108 (90 BASE) MCG/ACT inhaler - please bring with you to the hospital ALPRAZolam (XANAX) 0.5 MG tablet ANORO ELLIPTA 62.5-25 MCG/INH AEPB - please bring with you to the hospital  As of today, STOP taking any Aspirin (unless otherwise instructed by your surgeon) Aleve, Naproxen, Ibuprofen, Motrin, Advil, Goody's, BC's, all herbal medications, fish oil, and all vitamins.          Do not wear jewelry or makeup Do not wear lotions, powders, perfumes, or deodorant. Do not shave 48 hours prior to surgery.   Do not bring valuables to the hospital. DO Not wear nail polish, gel polish, artificial nails, or any other type of covering on  natural nails including finger and toenails. If patients have artificial nails, gel coating, etc. that need to be removed by a nail salon please have this removed prior to surgery or surgery may need to be canceled/delayed if the surgeon/ anesthesia feels like the patient is unable to be adequately monitored.             [facility] is not responsible for any belongings or valuables.  Do NOT Smoke  (Tobacco/Vaping) or drink Alcohol 24 hours prior to your procedure If you use a CPAP at night, you may bring all equipment for your overnight stay.   Contacts, glasses, dentures or bridgework may not be worn into surgery, please bring cases for these belongings   For patients admitted to the hospital, discharge time will be determined by your treatment team.   Patients discharged the day of surgery will not be allowed to drive home, and someone needs to stay with them for 24 hours.  ONLY 1 SUPPORT PERSON MAY BE PRESENT WHILE YOU ARE IN SURGERY. IF YOU ARE TO BE ADMITTED ONCE YOU ARE IN YOUR ROOM YOU WILL BE ALLOWED TWO (2) VISITORS.  Minor children may have two parents present. Special consideration for safety and communication needs will be reviewed on a case by case basis.  Special instructions:    Oral Hygiene is also important to reduce your risk of infection.  Remember - BRUSH YOUR TEETH THE MORNING OF SURGERY WITH YOUR REGULAR TOOTHPASTE   [facility]- Preparing For Surgery  Before surgery, you can play an important role. Because skin is not sterile, your skin needs to be as free of germs as possible. You can reduce the number of germs on your skin by washing with CHG (chlorahexidine gluconate) Soap before surgery.  CHG is an antiseptic cleaner which kills germs and bonds with the skin to continue killing germs even after washing.     Please do  not use if you have an allergy to CHG or antibacterial soaps. If your skin becomes reddened/irritated stop using the CHG.  Do not shave (including legs and underarms) for at least 48 hours prior to first CHG shower. It is OK to shave your face.  Please follow these instructions carefully.     Shower the NIGHT BEFORE SURGERY and the MORNING OF SURGERY with CHG Soap.   If you chose to wash your hair, wash your hair first as usual with your normal shampoo. After you shampoo, rinse your hair and body thoroughly to remove the shampoo.  Then [employer] and genitals (private parts) with your normal soap and rinse thoroughly to remove soap.  After that Use CHG Soap as you would any other liquid soap. You can apply CHG directly to the skin and wash gently with a scrungie or a clean washcloth.   Apply the CHG Soap to your body ONLY FROM THE NECK DOWN.  Do not use on open wounds or open sores. Avoid contact with your eyes, ears, mouth and genitals (private parts). Wash Face and genitals (private parts)  with your normal soap.   Wash thoroughly, paying special attention to the area where your surgery will be performed.  Thoroughly rinse your body with warm water from the neck down.  DO NOT shower/wash with your normal soap after using and rinsing off the CHG Soap.  Pat yourself dry with a CLEAN TOWEL.  Wear CLEAN PAJAMAS to bed the night before surgery  Place CLEAN SHEETS on your bed the night before your surgery  DO NOT SLEEP WITH PETS.   Day of Surgery:  Take a shower with CHG soap. Wear Clean/Comfortable clothing the morning of surgery Do not apply any deodorants/lotions.   Remember to brush your teeth WITH YOUR REGULAR TOOTHPASTE.   Please read over the following fact sheets that you were given.

## 2021-02-18 ENCOUNTER — Other Ambulatory Visit: Payer: Self-pay

## 2021-02-18 ENCOUNTER — Encounter (HOSPITAL_COMMUNITY)
Admission: RE | Admit: 2021-02-18 | Discharge: 2021-02-18 | Disposition: A | Discharge status: Home / Self Care | Payer: Medicare HMO | Source: Ambulatory Visit | Attending: Orthopaedic Surgery | Admitting: Orthopaedic Surgery

## 2021-02-18 ENCOUNTER — Encounter (HOSPITAL_COMMUNITY): Payer: Self-pay

## 2021-02-18 DIAGNOSIS — K219 Gastro-esophageal reflux disease without esophagitis: Secondary | ICD-10-CM | POA: Insufficient documentation

## 2021-02-18 DIAGNOSIS — Z01818 Encounter for other preprocedural examination: Secondary | ICD-10-CM | POA: Insufficient documentation

## 2021-02-18 DIAGNOSIS — I10 Essential (primary) hypertension: Secondary | ICD-10-CM | POA: Diagnosis not present

## 2021-02-18 DIAGNOSIS — Z20822 Contact with and (suspected) exposure to covid-19: Secondary | ICD-10-CM | POA: Insufficient documentation

## 2021-02-18 DIAGNOSIS — Z79899 Other long term (current) drug therapy: Secondary | ICD-10-CM | POA: Insufficient documentation

## 2021-02-18 DIAGNOSIS — Z853 Personal history of malignant neoplasm of breast: Secondary | ICD-10-CM | POA: Insufficient documentation

## 2021-02-18 DIAGNOSIS — F17200 Nicotine dependence, unspecified, uncomplicated: Secondary | ICD-10-CM | POA: Diagnosis not present

## 2021-02-18 DIAGNOSIS — J449 Chronic obstructive pulmonary disease, unspecified: Secondary | ICD-10-CM | POA: Diagnosis not present

## 2021-02-18 DIAGNOSIS — R7303 Prediabetes: Secondary | ICD-10-CM | POA: Insufficient documentation

## 2021-02-18 DIAGNOSIS — M1611 Unilateral primary osteoarthritis, right hip: Secondary | ICD-10-CM | POA: Insufficient documentation

## 2021-02-18 HISTORY — DX: Gastro-esophageal reflux disease without esophagitis: K21.9

## 2021-02-18 HISTORY — DX: Other complications of anesthesia, initial encounter: T88.59XA

## 2021-02-18 HISTORY — DX: Prediabetes: R73.03

## 2021-02-18 LAB — URINALYSIS, ROUTINE W REFLEX MICROSCOPIC
Bacteria, UA: NONE SEEN
Bilirubin Urine: NEGATIVE
Glucose, UA: NEGATIVE mg/dL
Hgb urine dipstick: NEGATIVE
Ketones, ur: NEGATIVE mg/dL
Nitrite: NEGATIVE
Protein, ur: NEGATIVE mg/dL
Specific Gravity, Urine: 1.02 (ref 1.005–1.030)
pH: 5 (ref 5.0–8.0)

## 2021-02-18 LAB — COMPREHENSIVE METABOLIC PANEL
ALT: 16 U/L (ref 0–44)
AST: 16 U/L (ref 15–41)
Albumin: 3.4 g/dL — ABNORMAL LOW (ref 3.5–5.0)
Alkaline Phosphatase: 68 U/L (ref 38–126)
Anion gap: 9 (ref 5–15)
BUN: 11 mg/dL (ref 8–23)
CO2: 31 mmol/L (ref 22–32)
Calcium: 9.3 mg/dL (ref 8.9–10.3)
Chloride: 99 mmol/L (ref 98–111)
Creatinine, Ser: 0.79 mg/dL (ref 0.44–1.00)
GFR, Estimated: >60 mL/min (ref >60)
Glucose, Bld: 101 mg/dL — ABNORMAL HIGH (ref 70–99)
Potassium: 2.8 mmol/L — ABNORMAL LOW (ref 3.5–5.1)
Sodium: 139 mmol/L (ref 135–145)
Total Bilirubin: 1.2 mg/dL (ref 0.3–1.2)
Total Protein: 6.4 g/dL — ABNORMAL LOW (ref 6.5–8.1)

## 2021-02-18 LAB — SURGICAL PCR SCREEN
MRSA, PCR: NEGATIVE
Staphylococcus aureus: NEGATIVE

## 2021-02-18 LAB — CBC
HCT: 50.2 % — ABNORMAL HIGH (ref 36.0–46.0)
Hemoglobin: 17.4 g/dL — ABNORMAL HIGH (ref 12.0–15.0)
MCH: 32.1 pg (ref 26.0–34.0)
MCHC: 34.7 g/dL (ref 30.0–36.0)
MCV: 92.6 fL (ref 80.0–100.0)
Platelets: 243 10*3/uL (ref 150–400)
RBC: 5.42 MIL/uL — ABNORMAL HIGH (ref 3.87–5.11)
RDW: 12.5 % (ref 11.5–15.5)
WBC: 9.2 10*3/uL (ref 4.0–10.5)
nRBC: 0 % (ref 0.0–0.2)

## 2021-02-18 LAB — TYPE AND SCREEN
ABO/RH(D): O POS
Antibody Screen: NEGATIVE

## 2021-02-18 LAB — HEMOGLOBIN A1C
Hgb A1c MFr Bld: 5.6 % (ref 4.8–5.6)
Mean Plasma Glucose: 114.02 mg/dL

## 2021-02-18 LAB — SARS CORONAVIRUS 2 (TAT 6-24 HRS): SARS Coronavirus 2: NEGATIVE

## 2021-02-18 NOTE — Progress Notes (Addendum)
Surgical Instructions    Your procedure is scheduled on August 1st - Monday.  Report to [facility] Main Entrance "A" at 1030 A.M., then check in with the Admitting office.  Call this number if you have problems the morning of surgery:  [phone]   If you have any questions prior to your surgery date call [phone]: Open Monday-Friday 8am-4pm    Remember:  Do not eat after midnight the night before your surgery  You may drink clear liquids until 9:30am the morning of your surgery.   Clear liquids allowed are: Water, Non-Citrus Juices (without pulp), Carbonated Beverages, Clear Tea, Black Coffee Only, and Gatorade Patient Instructions  The night before surgery:  No food after midnight. ONLY clear liquids after midnight  The day of surgery (if you have diabetes): Drink ONE (1) 12 oz G2 given to you in your pre admission testing appointment by 9:30 the morning of surgery. Drink in one sitting. Do not sip.  This drink was given to you during your hospital  pre-op appointment visit.  Nothing else to drink after completing the  12 oz bottle of G2.         If you have questions, please contact your surgeon's office.    Take these medicines the morning of surgery with A SIP OF WATER  pantoprazole (PROTONIX) 40 MG tablet sertraline (ZOLOFT) 100 MG tablet  IF NEEDED albuterol (PROVENTIL HFA;VENTOLIN HFA) 108 (90 BASE) MCG/ACT inhaler - please bring with you to the hospital ALPRAZolam (XANAX) 0.5 MG tablet ANORO ELLIPTA 62.5-25 MCG/INH AEPB - please bring with you to the hospital  As of today, STOP taking any Aspirin (unless otherwise instructed by your surgeon) Aleve, Naproxen, Ibuprofen, Motrin, Advil, Goody's, BC's, all herbal medications, fish oil, and all vitamins.          Do not wear jewelry or makeup Do not wear lotions, powders, perfumes, or deodorant. Do not shave 48 hours prior to surgery.   Do not bring valuables to the hospital. DO Not wear nail polish, gel polish,  artificial nails, or any other type of covering on  natural nails including finger and toenails. If patients have artificial nails, gel coating, etc. that need to be removed by a nail salon please have this removed prior to surgery or surgery may need to be canceled/delayed if the surgeon/ anesthesia feels like the patient is unable to be adequately monitored.             [facility] is not responsible for any belongings or valuables.  Do NOT Smoke (Tobacco/Vaping) or drink Alcohol 24 hours prior to your procedure If you use a CPAP at night, you may bring all equipment for your overnight stay.   Contacts, glasses, dentures or bridgework may not be worn into surgery, please bring cases for these belongings   For patients admitted to the hospital, discharge time will be determined by your treatment team.   Patients discharged the day of surgery will not be allowed to drive home, and someone needs to stay with them for 24 hours.  ONLY 1 SUPPORT PERSON MAY BE PRESENT WHILE YOU ARE IN SURGERY. IF YOU ARE TO BE ADMITTED ONCE YOU ARE IN YOUR ROOM YOU WILL BE ALLOWED TWO (2) VISITORS.  Minor children may have two parents present. Special consideration for safety and communication needs will be reviewed on a case by case basis.  Special instructions:    Oral Hygiene is also important to reduce your risk of infection.  Remember -  BRUSH YOUR TEETH THE MORNING OF SURGERY WITH YOUR REGULAR TOOTHPASTE   [facility]- Preparing For Surgery  Before surgery, you can play an important role. Because skin is not sterile, your skin needs to be as free of germs as possible. You can reduce the number of germs on your skin by washing with CHG (chlorahexidine gluconate) Soap before surgery.  CHG is an antiseptic cleaner which kills germs and bonds with the skin to continue killing germs even after washing.     Please do not use if you have an allergy to CHG or antibacterial soaps. If your skin becomes  reddened/irritated stop using the CHG.  Do not shave (including legs and underarms) for at least 48 hours prior to first CHG shower. It is OK to shave your face.  Please follow these instructions carefully.     Shower the NIGHT BEFORE SURGERY and the MORNING OF SURGERY with CHG Soap.   If you chose to wash your hair, wash your hair first as usual with your normal shampoo. After you shampoo, rinse your hair and body thoroughly to remove the shampoo.  Then [employer] and genitals (private parts) with your normal soap and rinse thoroughly to remove soap.  After that Use CHG Soap as you would any other liquid soap. You can apply CHG directly to the skin and wash gently with a scrungie or a clean washcloth.   Apply the CHG Soap to your body ONLY FROM THE NECK DOWN.  Do not use on open wounds or open sores. Avoid contact with your eyes, ears, mouth and genitals (private parts). Wash Face and genitals (private parts)  with your normal soap.   Wash thoroughly, paying special attention to the area where your surgery will be performed.  Thoroughly rinse your body with warm water from the neck down.  DO NOT shower/wash with your normal soap after using and rinsing off the CHG Soap.  Pat yourself dry with a CLEAN TOWEL.  Wear CLEAN PAJAMAS to bed the night before surgery  Place CLEAN SHEETS on your bed the night before your surgery  DO NOT SLEEP WITH PETS.   Day of Surgery:  Take a shower with CHG soap. Wear Clean/Comfortable clothing the morning of surgery Do not apply any deodorants/lotions.   Remember to brush your teeth WITH YOUR REGULAR TOOTHPASTE.   Please read over the following fact sheets that you were given.

## 2021-02-18 NOTE — Progress Notes (Signed)
PCP - Delphina Cahill MD Cardiologist - Kate Sable MD  PPM/ICD - denies Device Orders -  Rep Notified -   Chest x-ray -  EKG - 02/18/21 Stress Test -  ECHO - 12/04/19 Cardiac Cath - denies PFT's-11/24/20  Sleep Study - denies CPAP -   Fasting Blood Sugar - does not check  Checks Blood Sugar _____ times a day  Blood Thinner Instructions:n/a Aspirin Instructions:n/a  ERAS Protcol -clear liquids until 0930 PRE-SURGERY Ensure or G2- G2  COVID TEST- yes-02/18/21   Anesthesia review: yes abnormal EKG  Patient denies shortness of breath, fever, cough and chest pain at PAT appointment   All instructions explained to the patient, with a verbal understanding of the material. Patient agrees to go over the instructions while at home for a better understanding. Patient also instructed to self quarantine after being tested for COVID-19. The opportunity to ask questions was provided.

## 2021-02-18 NOTE — Progress Notes (Signed)
Left message for Regina Mcmahon at Dr. Narda Amber office - pt's serum potassium today was 2.8. Also informed Carollee Sires PA.

## 2021-02-19 ENCOUNTER — Telehealth: Payer: Self-pay | Admitting: Radiology

## 2021-02-19 NOTE — Anesthesia Preprocedure Evaluation (Addendum)
Anesthesia Evaluation  Patient identified by MRN, date of birth, ID band Patient awake    Reviewed: Allergy & Precautions, H&P , NPO status , Patient's Chart, lab work & pertinent test results, reviewed documented beta blocker date and time   History of Anesthesia Complications (+) history of anesthetic complications  Airway Mallampati: II  TM Distance: >3 FB Neck ROM: full    Dental no notable dental hx. (+) Edentulous Upper, Edentulous Lower   Pulmonary asthma , COPD, Current Smoker,    Pulmonary exam normal breath sounds clear to auscultation       Cardiovascular Exercise Tolerance: Good hypertension, Pt. on medications negative cardio ROS   Rhythm:regular Rate:Normal  EKG: 02/18/21: Sinus bradycardia at 53 bpm Low voltage QRS Cannot rule out Anterior infarct , age undetermined  Poor precordial R wave progression was also noted on 05/07/2018 EKG.  Echo 12/04/19: IMPRESSIONS  1. Left ventricular ejection fraction, by estimation, is 65 to 70%. The  left ventricle has normal function. The left ventricle has no regional  wall motion abnormalities. Left ventricular diastolic parameters were  normal.  2. Right ventricular systolic function is normal. The right ventricular  size is normal.  3. The mitral valve is grossly normal. No evidence of mitral valve  regurgitation.  4. The aortic valve is tricuspid. Aortic valve regurgitation is not  visualized. No aortic stenosis is present.  5. The inferior vena cava is normal in size with greater than 50%  respiratory variability, suggesting right atrial pressure of 3 mmHg.    Neuro/Psych PSYCHIATRIC DISORDERS Anxiety Depression negative neurological ROS     GI/Hepatic Neg liver ROS, GERD  Medicated and Controlled,  Endo/Other  Morbid obesity  Renal/GU negative Renal ROS  negative genitourinary   Musculoskeletal  (+) Arthritis , Osteoarthritis,    Abdominal    Peds  Hematology negative hematology ROS (+)   Anesthesia Other Findings   Reproductive/Obstetrics negative OB ROS                            Anesthesia Physical Anesthesia Plan  ASA: 3  Anesthesia Plan: Spinal and MAC   Post-op Pain Management:    Induction: Intravenous  PONV Risk Score and Plan: 3 and Propofol infusion  Airway Management Planned: Nasal Cannula, Natural Airway and Simple Face Mask  Additional Equipment: None  Intra-op Plan:   Post-operative Plan:   Informed Consent: I have reviewed the patients History and Physical, chart, labs and discussed the procedure including the risks, benefits and alternatives for the proposed anesthesia with the patient or authorized representative who has indicated his/her understanding and acceptance.     Dental Advisory Given  Plan Discussed with: CRNA and Anesthesiologist  Anesthesia Plan Comments: (PAT note written 02/19/2021 by Myra Gianotti, PA-C. For potassium recheck DOS.  DISCUSSION: Patient is a 70 year old female scheduled for the above procedure.  History includes smoking, COPD Gold II, asthma, HTN, palpitations, panic attacks, prediabetes, GERD, left breast cancer (s/p left breast lumpectomy 08/20/14; PAC 09/17/14, s/p chemoradiation).  She reported "difficulty waking up, after anesthesia.  BMI is consistent with obesity.  Last pulmonology evaluation 01/11/2021. Dr. Melvyn Novas wrote, "Active smoker limited by R hip pain >>doe as of 1st Pulmonary eval 10/12/2020 - PFT's 11/24/20 FEV1 1.59 (67 % ) ratio 0.61 p 0 % improvement from saba p saba prior to study with DLCO 8.42 (42%) corrects to 2.38 (56%) for alv volume and FV curve mild concavity and ERV 36% @  wt 226   Pt is Group B in terms of symptom/risk and laba/lama therefore appropriate rx at this point >>>Continue anoro and prn saba  In absence of acute exac ok to proceed with hip surgery, ok for GA if necessary".  K low at 2.7.  She is on HCTZ 12.5 mg, Lasix 20 mg, and potassium citrate 10 mEq daily. Ortho is aware, and patient in communication regarding taking any extra potassium before surgery. Will order ISTAT on arrival. 02/18/21 pre-surgical COVID-19 test negative. )       Anesthesia Quick Evaluation

## 2021-02-19 NOTE — Telephone Encounter (Signed)
Please see message from [place] in [place] office below and advise.    Patient:  Regina Mcmahon  DOB:  12/30/1950  Ph #: [phone]     She left a message from last night about her Potassium.  She wants to know if she is supposed to take 1 or 2 of her 1080mg  Potassium a day before surgery?

## 2021-02-19 NOTE — Progress Notes (Signed)
Anesthesia Chart Review:  Case: [id number] Date/Time: 02/22/21 1215   Procedure: RIGHT TOTAL HIP ARTHROPLASTY ANTERIOR APPROACH (Right: Hip)   Anesthesia type: Spinal   Pre-op diagnosis: right hip osteoarthritis   Location: [facility]   Surgeons: Marybelle Killings, MD       DISCUSSION: Patient is a 70 year old female scheduled for the above procedure.  History includes smoking, COPD Gold II, asthma, HTN, palpitations, panic attacks, prediabetes, GERD, left breast cancer (s/p left breast lumpectomy 08/20/14; PAC 09/17/14, s/p chemoradiation).  She reported "difficulty waking up, after anesthesia.  BMI is consistent with obesity.  Last pulmonology evaluation 01/11/2021. Dr. Melvyn Novas wrote, "Active smoker limited by R hip pain >> doe as of 1st Pulmonary eval 10/12/2020 - PFT's  11/24/20  FEV1 1.59 (67 % ) ratio 0.61  p 0 % improvement from saba p saba prior to study with DLCO  8.42 (42%) corrects to 2.38 (56%)  for alv volume and FV curve mild concavity and ERV 36% @ wt 226     Pt is Group B in terms of symptom/risk and laba/lama therefore appropriate rx at this point >>>  Continue anoro and prn saba   In absence of acute exac ok to proceed with hip surgery, ok for GA if necessary".  K low at 2.7. She is on HCTZ 12.5 mg, Lasix 20 mg, and potassium citrate 10 mEq daily. Ortho is aware, and patient in communication regarding taking any extra potassium before surgery. Will order ISTAT on arrival. 02/18/21 pre-surgical COVID-19 test negative.   VS: BP 133/71   Pulse 60   Temp 36.9 C (Oral)   Resp 18   Ht 5\' 4"  (1.626 m)   Wt 99.5 kg   SpO2 95%   BMI 37.64 kg/m    PROVIDERS: [name], MD is PCP  Christinia Gully, MD is pulmonologist Derek Jack, MD is HEM-ONC She is not followed routinely by cardiology, but was evaluated by Kate Sable, MD in 2021 (no longer with practice) for BLE edema. Echo was "normal."  He prescribed her Lasix.  He referred patient to vascular surgery  for varicose veins.  Seen by Leontine Locket, PA-C on 01/21/2020.  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   LABS: Preoperative labs noted. H/H 17.4/50.2. + Smoker, and similar to previous. PLT 243. K 2.8, will need to recheck day of surgery.  (all labs ordered are listed, but only abnormal results are displayed)  Labs Reviewed  CBC - Abnormal; Notable for the following components:      Result Value   RBC 5.42 (*)    Hemoglobin 17.4 (*)    HCT 50.2 (*)    All other components within normal limits  COMPREHENSIVE METABOLIC PANEL - Abnormal; Notable for the following components:   Potassium 2.8 (*)    Glucose, Bld 101 (*)    Total Protein 6.4 (*)    Albumin 3.4 (*)    All other components within normal limits  URINALYSIS, ROUTINE W REFLEX MICROSCOPIC - Abnormal; Notable for the following components:   APPearance HAZY (*)    Leukocytes,Ua TRACE (*)    All other components within normal limits  SURGICAL PCR SCREEN  SARS CORONAVIRUS 2 (TAT 6-24 HRS)  HEMOGLOBIN A1C  TYPE AND SCREEN    PFT's  11/24/20  FEV1 1.59 (67 % ) ratio 0.61  p 0 % improvement from saba p saba prior to study with DLCO  8.42 (42%) corrects to 2.38 (56%)  for alv volume and FV curve mild concavity and ERV 36% @ wt 226     IMAGES: MRI L-spine 10/12/19: IMPRESSION: 1. Degenerative changes most notable at L4-5 where there is facet osteoarthritis with anterolisthesis and disc narrowing. Moderate left foraminal narrowing at this level. 2. Diffusely patent spinal canal.    EKG: 02/18/21: Sinus bradycardia at 53 bpm Low voltage QRS Cannot rule out Anterior infarct , age undetermined Abnormal ECG Confirmed by Cherlynn Kaiser [phone]) on 02/18/2021 9:40:06 PM - Poor precordial R wave progression was also noted on 05/07/2018 EKG.   CV: Echo 12/04/19: IMPRESSIONS   1.  Left ventricular ejection fraction, by estimation, is 65 to 70%. The  left ventricle has normal function. The left ventricle has no regional  wall motion abnormalities. Left ventricular diastolic parameters were  normal.   2. Right ventricular systolic function is normal. The right ventricular  size is normal.   3. The mitral valve is grossly normal. No evidence of mitral valve  regurgitation.   4. The aortic valve is tricuspid. Aortic valve regurgitation is not  visualized. No aortic stenosis is present.   5. The inferior vena cava is normal in size with greater than 50%  respiratory variability, suggesting right atrial pressure of 3 mmHg.    Past Medical History:  Diagnosis Date   Asthma    Asymptomatic varicose veins    Breast cancer ([place]) 06/2014   left   Chronic airway obstruction (HCC)    Complication of anesthesia    difficulty waking up   COPD (chronic obstructive pulmonary disease) (HCC)    Depression    GERD (gastroesophageal reflux disease)    Hemorrhage rectum    and anus.   Hypertension    Insomnia    Other symptoms involving digestive system(787.99)    Pain    Hx.pain in joint involving pelvic region and thigh; pain in limb   Palpitations    Panic attacks    Personal history of chemotherapy 2016   Personal history of radiation therapy 2016   Pre-diabetes    Sinusitis    Symptomatic menopausal or female climacteric states     Past Surgical History:  Procedure Laterality Date   BREAST BIOPSY Left 10/2012   BREAST BIOPSY Left 07/01/2014   malignant   BREAST LUMPECTOMY Left 08/20/2014   CESAREAN SECTION     CHOLECYSTECTOMY  1985   COLONOSCOPY  2002   Dr. Lindalou Hose: internal hemorrhoids, one small rectal polyp, adenomatous path    COLONOSCOPY N/A 11/23/2015   Procedure: COLONOSCOPY;  Surgeon: Danie Binder, MD;  [facility];  Service: Endoscopy;  Laterality: N/A;  1100 - moved to 10:45 - office to notify   ESOPHAGOGASTRODUODENOSCOPY N/A  11/23/2015   Procedure: ESOPHAGOGASTRODUODENOSCOPY (EGD);  Surgeon: Danie Binder, MD;  [facility];  Service: Endoscopy;  Laterality: N/A;   PORT-A-CATH REMOVAL  02/2015   PORTACATH PLACEMENT Right 09/17/2014   Procedure: INSERTION PORT-A-CATH WITH ULTRASOUND;  Surgeon: Erroll Luna, MD;  Location: [facility];  Service: General;  Laterality: Right;  IJ   RADIOACTIVE SEED GUIDED PARTIAL MASTECTOMY WITH AXILLARY SENTINEL LYMPH NODE BIOPSY Left 08/20/2014   Procedure: LEFT BREAST LUMPECTOMY WITH RADIOACTIVE SEED LOCALIZATION AND SENTINEL LYMPH NODE MAPPING;  Surgeon: Erroll Luna, MD;  Location: [facility];  Service: General;  Laterality: Left;   RE-EXCISION OF  BREAST LUMPECTOMY Left 09/17/2014   Procedure: RE-EXCISION OF BREAST LUMPECTOMY;  Surgeon: Erroll Luna, MD;  Location: [facility];  Service: General;  Laterality: Left;   TUBAL LIGATION      MEDICATIONS:  albuterol (PROVENTIL HFA;VENTOLIN HFA) 108 (90 BASE) MCG/ACT inhaler   ALPRAZolam (XANAX) 0.5 MG tablet   ANORO ELLIPTA 62.5-25 MCG/INH AEPB   azithromycin (ZITHROMAX) 250 MG tablet   calcium carbonate (TUMS - DOSED IN MG ELEMENTAL CALCIUM) 500 MG chewable tablet   furosemide (LASIX) 20 MG tablet   gabapentin (NEURONTIN) 300 MG capsule   hydrochlorothiazide (HYDRODIURIL) 12.5 MG tablet   ibuprofen (ADVIL) 800 MG tablet   pantoprazole (PROTONIX) 40 MG tablet   potassium citrate (UROCIT-K) 10 MEQ (1080 MG) SR tablet   sertraline (ZOLOFT) 100 MG tablet   No current facility-administered medications for this encounter.    Myra Gianotti, PA-C Surgical Short Stay/Anesthesiology [facility] Phone [phone] [facility] Phone [phone] 02/19/2021 1:28 PM

## 2021-02-19 NOTE — Telephone Encounter (Signed)
noted 

## 2021-02-21 NOTE — H&P (Signed)
TOTAL HIP ADMISSION H&P  Patient is admitted for right total hip arthroplasty.  Subjective:  Chief Complaint: right hip pain  HPI: Regina Mcmahon, 70 y.o. female, has a history of pain and functional disability in the right hip(s) due to arthritis and patient has failed non-surgical conservative treatments for greater than 12 weeks to include NSAID's and/or analgesics, supervised PT with diminished ADL's post treatment, use of assistive devices, weight reduction as appropriate, and activity modification.  Onset of symptoms was gradual starting 10 years ago with gradually worsening course since that time.The patient noted no past surgery on the right hip(s).  Patient currently rates pain in the right hip at 10 out of 10 with activity. Patient has night pain, worsening of pain with activity and weight bearing, trendelenberg gait, pain that interfers with activities of daily living, and pain with passive range of motion. Patient has evidence of subchondral sclerosis, periarticular osteophytes, and joint space narrowing by imaging studies. This condition presents safety issues increasing the risk of falls. .  There is no current active infection.  Patient cleared by pulmonologist Dr Melvyn Novas.   Patient Active Problem List   Diagnosis Date Noted   Unilateral primary osteoarthritis, right hip 01/21/2021   COPD GOLD II  /  still smoking  10/12/2020   Shortness of breath 09/12/2016   Well woman exam with routine gynecological exam 04/20/2016   Morbid obesity due to excess calories ([place]) 03/03/2016   Colon adenomas 03/03/2016   Rash 01/18/2016   GERD (gastroesophageal reflux disease) 10/19/2015   Osteopenia determined by x-ray 09/07/2015   High risk medication use 09/07/2015   Genetic testing 03/20/2015   Family history of ovarian cancer 03/20/2015   Family history of colon cancer 03/20/2015   Family history of cancer 03/20/2015   Carcinoma of upper-outer quadrant of left female breast ([place])  09/03/2014   Anxiety 10/29/2012   Blood in stool 10/29/2012   Pain 10/29/2012   Chronic airway obstruction ([place]) 10/29/2012   Diarrhea 10/29/2012   Cigarette smoker 10/29/2012   Essential hypertension 10/29/2012   Past Medical History:  Diagnosis Date   Asthma    Asymptomatic varicose veins    Breast cancer ([place]) 06/2014   left   Chronic airway obstruction ([facility])    Complication of anesthesia    difficulty waking up   COPD (chronic obstructive pulmonary disease) ([facility])    Depression    GERD (gastroesophageal reflux disease)    Hemorrhage rectum    and anus.   Hypertension    Insomnia    Other symptoms involving digestive system(787.99)    Pain    Hx.pain in joint involving pelvic region and thigh; pain in limb   Palpitations    Panic attacks    Personal history of chemotherapy 2016   Personal history of radiation therapy 2016   Pre-diabetes    Sinusitis    Symptomatic menopausal or female climacteric states     Past Surgical History:  Procedure Laterality Date   BREAST BIOPSY Left 10/2012   BREAST BIOPSY Left 07/01/2014   malignant   BREAST LUMPECTOMY Left 08/20/2014   CESAREAN SECTION     CHOLECYSTECTOMY  1985   COLONOSCOPY  2002   Dr. Lindalou Hose: internal hemorrhoids, one small rectal polyp, adenomatous path    COLONOSCOPY N/A 11/23/2015   Procedure: COLONOSCOPY;  Surgeon: Danie Binder, MD;  [facility];  Service: Endoscopy;  Laterality: N/A;  1100 - moved to 10:45 - office to notify   ESOPHAGOGASTRODUODENOSCOPY  N/A 11/23/2015   Procedure: ESOPHAGOGASTRODUODENOSCOPY (EGD);  Surgeon: Danie Binder, MD;  [facility];  Service: Endoscopy;  Laterality: N/A;   PORT-A-CATH REMOVAL  02/2015   PORTACATH PLACEMENT Right 09/17/2014   Procedure: INSERTION PORT-A-CATH WITH ULTRASOUND;  Surgeon: Erroll Luna, MD;  Location: [place];  Service: General;  Laterality: Right;  IJ   RADIOACTIVE SEED GUIDED PARTIAL MASTECTOMY WITH AXILLARY  SENTINEL LYMPH NODE BIOPSY Left 08/20/2014   Procedure: LEFT BREAST LUMPECTOMY WITH RADIOACTIVE SEED LOCALIZATION AND SENTINEL LYMPH NODE MAPPING;  Surgeon: Erroll Luna, MD;  Location: [place];  Service: General;  Laterality: Left;   RE-EXCISION OF BREAST LUMPECTOMY Left 09/17/2014   Procedure: RE-EXCISION OF BREAST LUMPECTOMY;  Surgeon: Erroll Luna, MD;  Location: [place];  Service: General;  Laterality: Left;   TUBAL LIGATION      No current facility-administered medications for this encounter.   Current Outpatient Medications  Medication Sig Dispense Refill Last Dose   albuterol (PROVENTIL HFA;VENTOLIN HFA) 108 (90 BASE) MCG/ACT inhaler Inhale 2 puffs into the lungs every 6 (six) hours as needed for wheezing or shortness of breath.       ALPRAZolam (XANAX) 0.5 MG tablet Take 0.5 mg by mouth 3 (three) times daily as needed for anxiety.       ANORO ELLIPTA 62.5-25 MCG/INH AEPB Inhale 1 puff into the lungs daily as needed (SOB).      calcium carbonate (TUMS - DOSED IN MG ELEMENTAL CALCIUM) 500 MG chewable tablet Chew 1 tablet by mouth 3 (three) times daily as needed for indigestion or heartburn. Reported on 12/07/2015      furosemide (LASIX) 20 MG tablet Take 20 mg by mouth daily.      gabapentin (NEURONTIN) 300 MG capsule Take 1 capsule (300 mg total) by mouth 3 (three) times daily. (Patient taking differently: Take 300 mg by mouth at bedtime.) 90 capsule 5    hydrochlorothiazide (HYDRODIURIL) 12.5 MG tablet Take 12.5 mg by mouth daily.      pantoprazole (PROTONIX) 40 MG tablet Take 1 tablet by mouth once daily (Patient taking differently: Take 40 mg by mouth daily.) 90 tablet 0    potassium citrate (UROCIT-K) 10 MEQ (1080 MG) SR tablet Take 10 mEq by mouth daily.      sertraline (ZOLOFT) 100 MG tablet Take 100 mg by mouth daily.      azithromycin (ZITHROMAX) 250 MG tablet 2 on day one then 1 daily (Patient not taking: No sig reported) 6 tablet 0 Not Taking    ibuprofen (ADVIL) 800 MG tablet Take 800 mg by mouth 3 (three) times daily.      No Known Allergies  Social History   Tobacco Use   Smoking status: Every Day    Packs/day: 1.00    Years: 42.00    Pack years: 42.00    Types: Cigarettes   Smokeless tobacco: Never  Substance Use Topics   Alcohol use: No    Alcohol/week: 0.0 standard drinks    Family History  Problem Relation Age of Onset   Diabetes Mother    Ovarian cancer Mother 65   Other Father        died in MVA at age 21   Other Sister        mitral valve disorder   Melanoma Sister 63       removed from back of leg   Colon cancer Brother        early 81s, succumbed  to disease   Cancer Paternal Aunt        leukemia, bone, or lung cancer   Alzheimer's disease Maternal Grandmother    Heart attack Maternal Grandfather    Heart attack Paternal Grandmother    COPD Paternal Grandfather    Emphysema Paternal Grandfather    Congestive Heart Failure Paternal Aunt    Stomach cancer Paternal Uncle    Kidney cancer Maternal Uncle    Cancer Cousin        dx. teens/dx. 25s   Cancer Cousin    Colon cancer Cousin      Review of Systems  Constitutional:  Positive for activity change.  HENT: Negative.    Eyes: Negative.   Respiratory: Negative.    Cardiovascular: Negative.   Gastrointestinal: Negative.   Genitourinary: Negative.   Musculoskeletal:  Positive for gait problem.  Psychiatric/Behavioral: Negative.     Objective:  Physical Exam HENT:     Head: Normocephalic.  Eyes:     Extraocular Movements: Extraocular movements intact.     Pupils: Pupils are equal, round, and reactive to light.  Cardiovascular:     Rate and Rhythm: Regular rhythm.     Heart sounds: Normal heart sounds.  Pulmonary:     Effort: Pulmonary effort is normal. No respiratory distress.  Abdominal:     General: There is no distension.  Musculoskeletal:        General: Tenderness present.     Cervical back: Normal range of motion.   Neurological:     Mental Status: She is oriented to person, place, and time.  Psychiatric:        Mood and Affect: Mood normal.    Vital signs in last 24 hours:    Labs:   Estimated body mass index is 37.64 kg/m as calculated from the following:   Height as of 02/18/21: 5\' 4"  (1.626 m).   Weight as of 02/18/21: 99.5 kg.   Imaging Review Plain radiographs demonstrate moderate degenerative joint disease of the right hip(s). The bone quality appears to be good for age and reported activity level.      Assessment/Plan:  End stage arthritis, right hip(s)  The patient history, physical examination, clinical judgement of the provider and imaging studies are consistent with end stage degenerative joint disease of the right hip(s) and total hip arthroplasty is deemed medically necessary. The treatment options including medical management, injection therapy, arthroscopy and arthroplasty were discussed at length. The risks and benefits of total hip arthroplasty were presented and reviewed. The risks due to aseptic loosening, infection, stiffness, dislocation/subluxation,  thromboembolic complications and other imponderables were discussed.  The patient acknowledged the explanation, agreed to proceed with the plan and consent was signed. Patient is being admitted for inpatient treatment for surgery, pain control, PT, OT, prophylactic antibiotics, VTE prophylaxis, progressive ambulation and ADL's and discharge planning.The patient is planning to be discharged home with home health services

## 2021-02-22 ENCOUNTER — Other Ambulatory Visit: Payer: Self-pay

## 2021-02-22 ENCOUNTER — Observation Stay (HOSPITAL_COMMUNITY): Payer: Medicare HMO

## 2021-02-22 ENCOUNTER — Ambulatory Visit (HOSPITAL_COMMUNITY): Payer: Medicare HMO

## 2021-02-22 ENCOUNTER — Encounter (HOSPITAL_COMMUNITY)
Admission: RE | Disposition: A | Discharge status: Home Health | Payer: Self-pay | Source: Home / Self Care | Attending: Orthopaedic Surgery

## 2021-02-22 ENCOUNTER — Ambulatory Visit (HOSPITAL_COMMUNITY): Payer: Medicare HMO | Admitting: Vascular Surgery

## 2021-02-22 ENCOUNTER — Inpatient Hospital Stay (HOSPITAL_COMMUNITY)
Admission: RE | Admit: 2021-02-22 | Discharge: 2021-02-27 | DRG: 469 | Disposition: A | Discharge status: Home Health | Payer: Medicare HMO | Attending: Orthopaedic Surgery | Admitting: Orthopaedic Surgery

## 2021-02-22 ENCOUNTER — Encounter (HOSPITAL_COMMUNITY): Payer: Self-pay | Admitting: Orthopaedic Surgery

## 2021-02-22 DIAGNOSIS — Z923 Personal history of irradiation: Secondary | ICD-10-CM

## 2021-02-22 DIAGNOSIS — Z9221 Personal history of antineoplastic chemotherapy: Secondary | ICD-10-CM

## 2021-02-22 DIAGNOSIS — Z9049 Acquired absence of other specified parts of digestive tract: Secondary | ICD-10-CM

## 2021-02-22 DIAGNOSIS — Z8041 Family history of malignant neoplasm of ovary: Secondary | ICD-10-CM

## 2021-02-22 DIAGNOSIS — M1611 Unilateral primary osteoarthritis, right hip: Principal | ICD-10-CM | POA: Diagnosis present

## 2021-02-22 DIAGNOSIS — Z419 Encounter for procedure for purposes other than remedying health state, unspecified: Secondary | ICD-10-CM

## 2021-02-22 DIAGNOSIS — Z28310 Unvaccinated for covid-19: Secondary | ICD-10-CM

## 2021-02-22 DIAGNOSIS — R0600 Dyspnea, unspecified: Secondary | ICD-10-CM

## 2021-02-22 DIAGNOSIS — J811 Chronic pulmonary edema: Secondary | ICD-10-CM | POA: Diagnosis not present

## 2021-02-22 DIAGNOSIS — F17210 Nicotine dependence, cigarettes, uncomplicated: Secondary | ICD-10-CM | POA: Diagnosis present

## 2021-02-22 DIAGNOSIS — F410 Panic disorder [episodic paroxysmal anxiety] without agoraphobia: Secondary | ICD-10-CM | POA: Diagnosis present

## 2021-02-22 DIAGNOSIS — Z853 Personal history of malignant neoplasm of breast: Secondary | ICD-10-CM

## 2021-02-22 DIAGNOSIS — I10 Essential (primary) hypertension: Secondary | ICD-10-CM | POA: Diagnosis present

## 2021-02-22 DIAGNOSIS — J449 Chronic obstructive pulmonary disease, unspecified: Secondary | ICD-10-CM | POA: Diagnosis present

## 2021-02-22 DIAGNOSIS — Z6837 Body mass index (BMI) 37.0-37.9, adult: Secondary | ICD-10-CM

## 2021-02-22 DIAGNOSIS — K219 Gastro-esophageal reflux disease without esophagitis: Secondary | ICD-10-CM | POA: Diagnosis present

## 2021-02-22 DIAGNOSIS — Z09 Encounter for follow-up examination after completed treatment for conditions other than malignant neoplasm: Secondary | ICD-10-CM

## 2021-02-22 DIAGNOSIS — Z806 Family history of leukemia: Secondary | ICD-10-CM

## 2021-02-22 DIAGNOSIS — Z801 Family history of malignant neoplasm of trachea, bronchus and lung: Secondary | ICD-10-CM

## 2021-02-22 DIAGNOSIS — J9811 Atelectasis: Secondary | ICD-10-CM

## 2021-02-22 DIAGNOSIS — Z808 Family history of malignant neoplasm of other organs or systems: Secondary | ICD-10-CM

## 2021-02-22 DIAGNOSIS — R059 Cough, unspecified: Secondary | ICD-10-CM

## 2021-02-22 DIAGNOSIS — Z20822 Contact with and (suspected) exposure to covid-19: Secondary | ICD-10-CM | POA: Diagnosis present

## 2021-02-22 DIAGNOSIS — Z8051 Family history of malignant neoplasm of kidney: Secondary | ICD-10-CM

## 2021-02-22 DIAGNOSIS — J9601 Acute respiratory failure with hypoxia: Secondary | ICD-10-CM | POA: Diagnosis not present

## 2021-02-22 DIAGNOSIS — Z79899 Other long term (current) drug therapy: Secondary | ICD-10-CM

## 2021-02-22 DIAGNOSIS — R7303 Prediabetes: Secondary | ICD-10-CM | POA: Diagnosis present

## 2021-02-22 DIAGNOSIS — Z800 Family history of malignant neoplasm of digestive organs: Secondary | ICD-10-CM

## 2021-02-22 HISTORY — PX: TOTAL HIP ARTHROPLASTY: SHX124

## 2021-02-22 LAB — POCT I-STAT, CHEM 8
BUN: 10 mg/dL (ref 8–23)
Calcium, Ion: 1.17 mmol/L (ref 1.15–1.40)
Chloride: 102 mmol/L (ref 98–111)
Creatinine, Ser: 0.6 mg/dL (ref 0.44–1.00)
Glucose, Bld: 96 mg/dL (ref 70–99)
HCT: 49 % — ABNORMAL HIGH (ref 36.0–46.0)
Hemoglobin: 16.7 g/dL — ABNORMAL HIGH (ref 12.0–15.0)
Potassium: 3.3 mmol/L — ABNORMAL LOW (ref 3.5–5.1)
Sodium: 138 mmol/L (ref 135–145)
TCO2: 27 mmol/L (ref 22–32)

## 2021-02-22 LAB — GLUCOSE, CAPILLARY: Glucose-Capillary: 103 mg/dL — ABNORMAL HIGH (ref 70–99)

## 2021-02-22 LAB — ABO/RH: ABO/RH(D): O POS

## 2021-02-22 IMAGING — RF DG C-ARM 1-60 MIN
1 series · 2 of 2 positions shown · non-contrast
Comparison: Radiographs of the right hip [DATE].

CLINICAL DATA: Surgery, elective [0I] ([0I]-CM). Additional
history provided by technologist: Right total hip arthroplasty
anterior approach. Provided fluoroscopy time 32 seconds (5.885 mGy).

EXAM:
OPERATIVE right HIP (WITH PELVIS IF PERFORMED) 2 VIEWS
TECHNIQUE: Fluoroscopic spot image(s) were submitted for interpretation
post-operatively.

[Series 1: unknown protocol · 0.20mm/px · 2 of 2 slices shown]
[im 1/2]
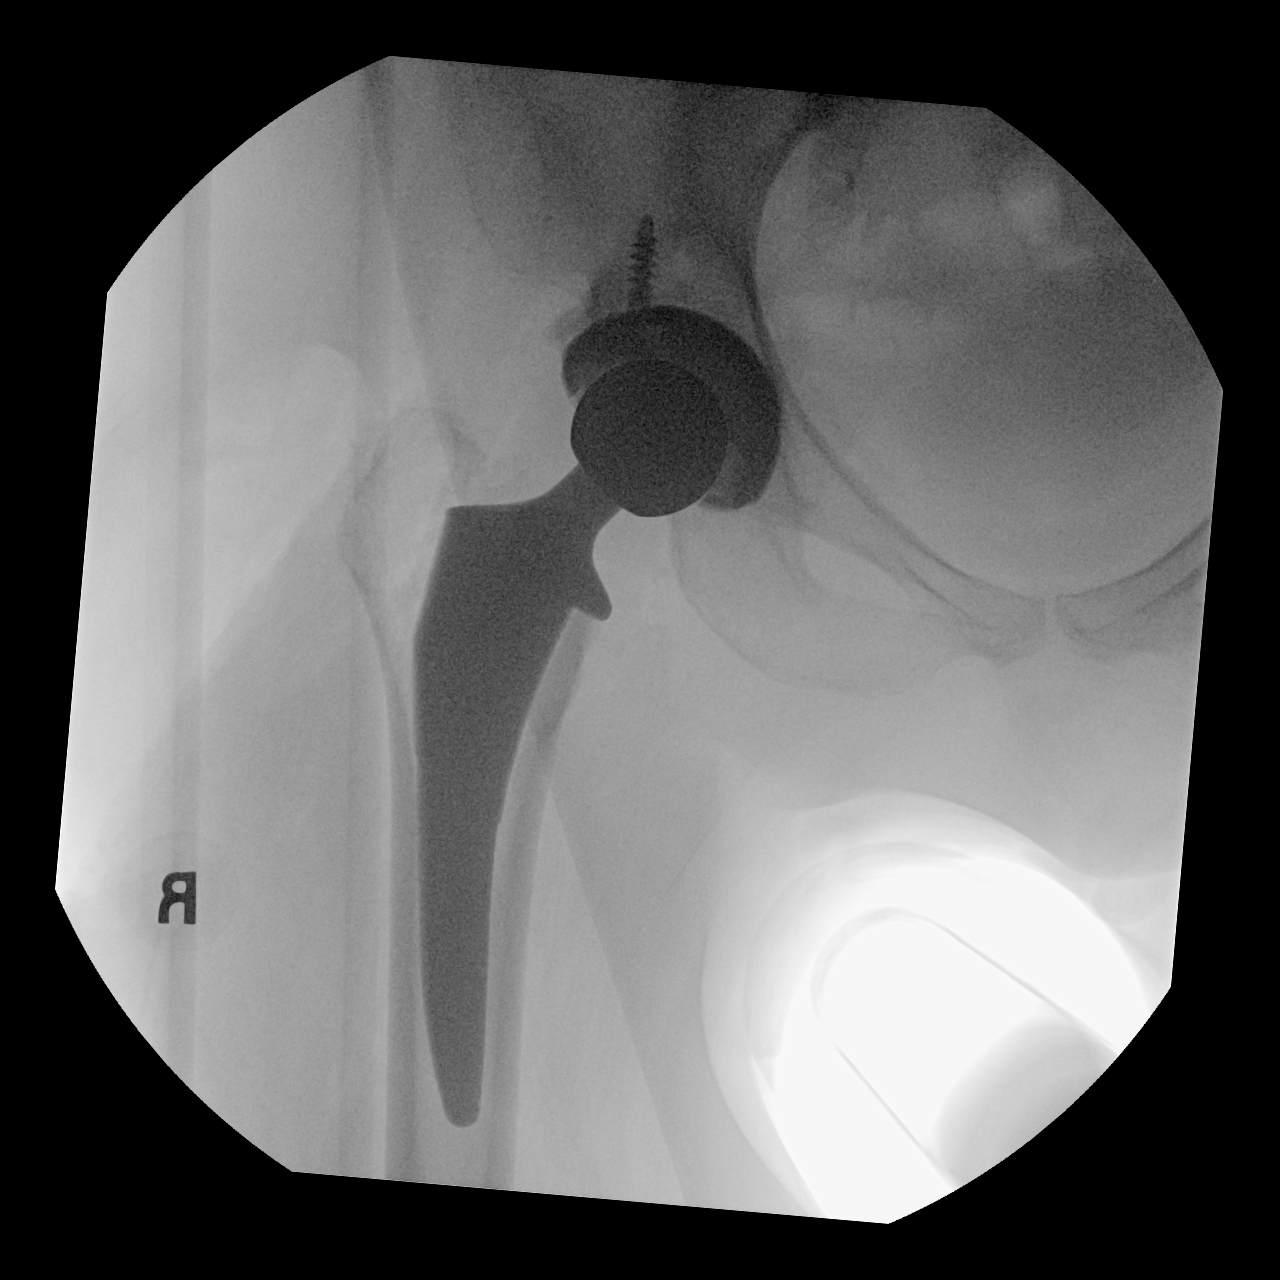
[im 2/2]
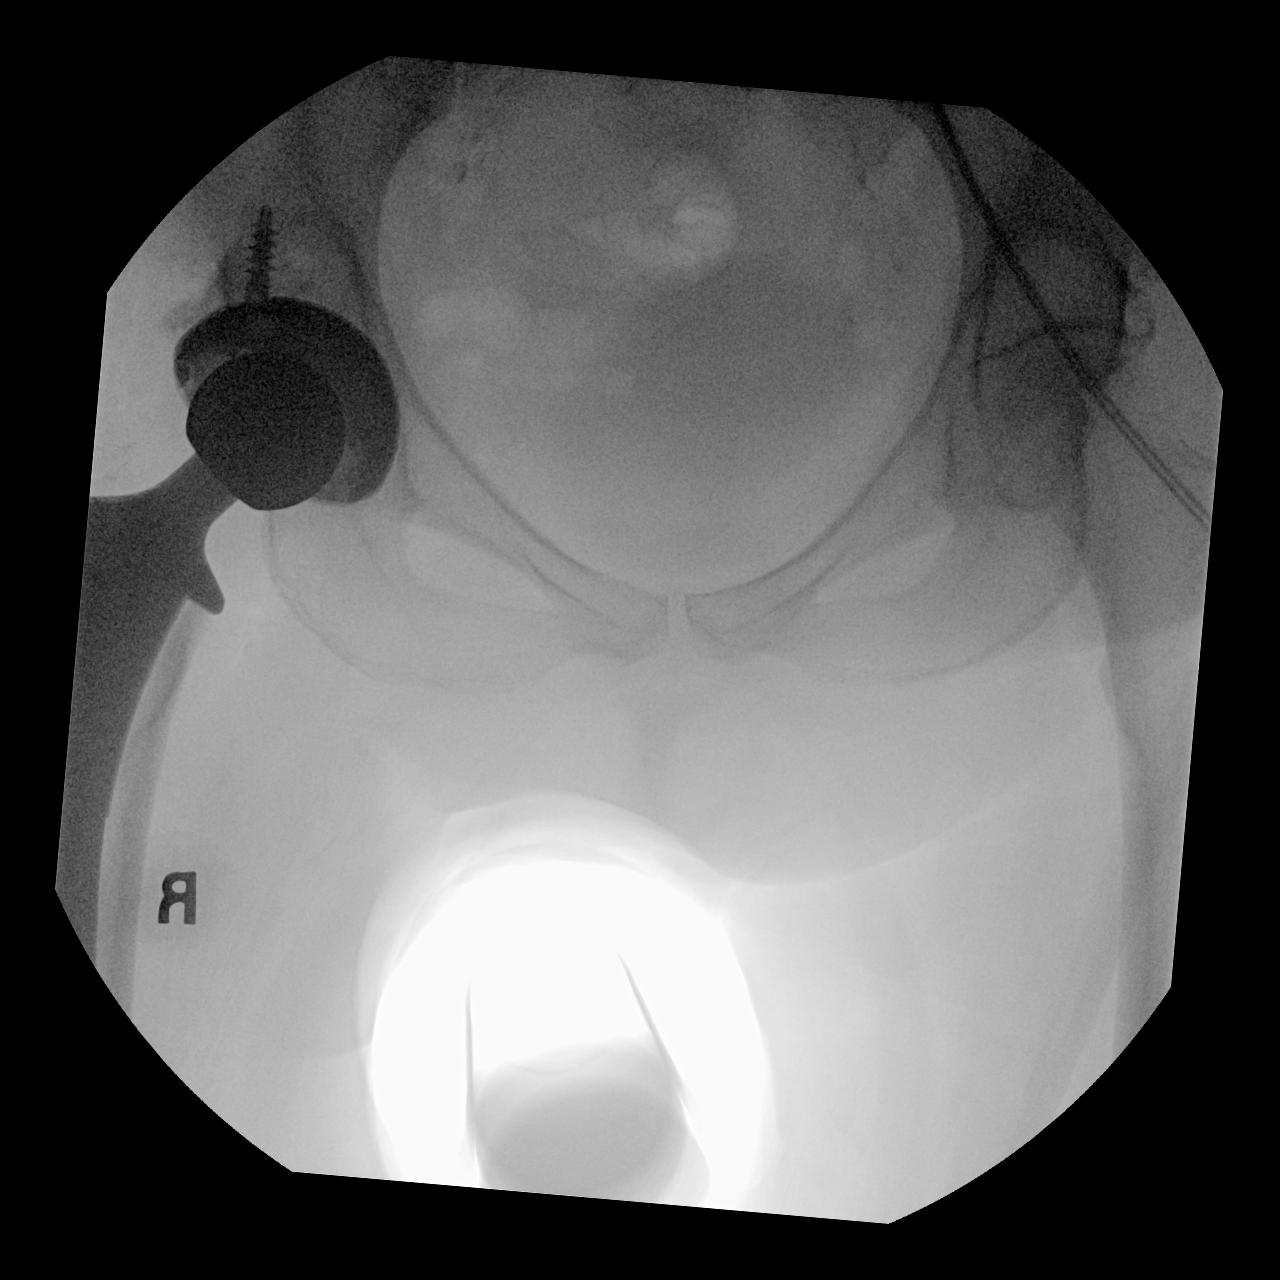

[2 of 2 positions shown; findings below may reference images not displayed]

FINDINGS: Two intraoperative fluoroscopic images of the right hip are
submitted. On the provided images, there are findings of interval
right total hip arthroplasty. The femoral and acetabular components
appear well seated. No unexpected finding.
IMPRESSION: Two intraoperative fluoroscopic images of the right hip from right
total hip arthroplasty.

## 2021-02-22 IMAGING — DX DG HIP (WITH OR WITHOUT PELVIS) 1V PORT*R*
3 series · 3 of 3 positions shown · non-contrast
Comparison: None.

CLINICAL DATA: Status post right hip total arthroplasty

EXAM:
DG HIP (WITH OR WITHOUT PELVIS) 1V PORT RIGHT

[pelvis ap]
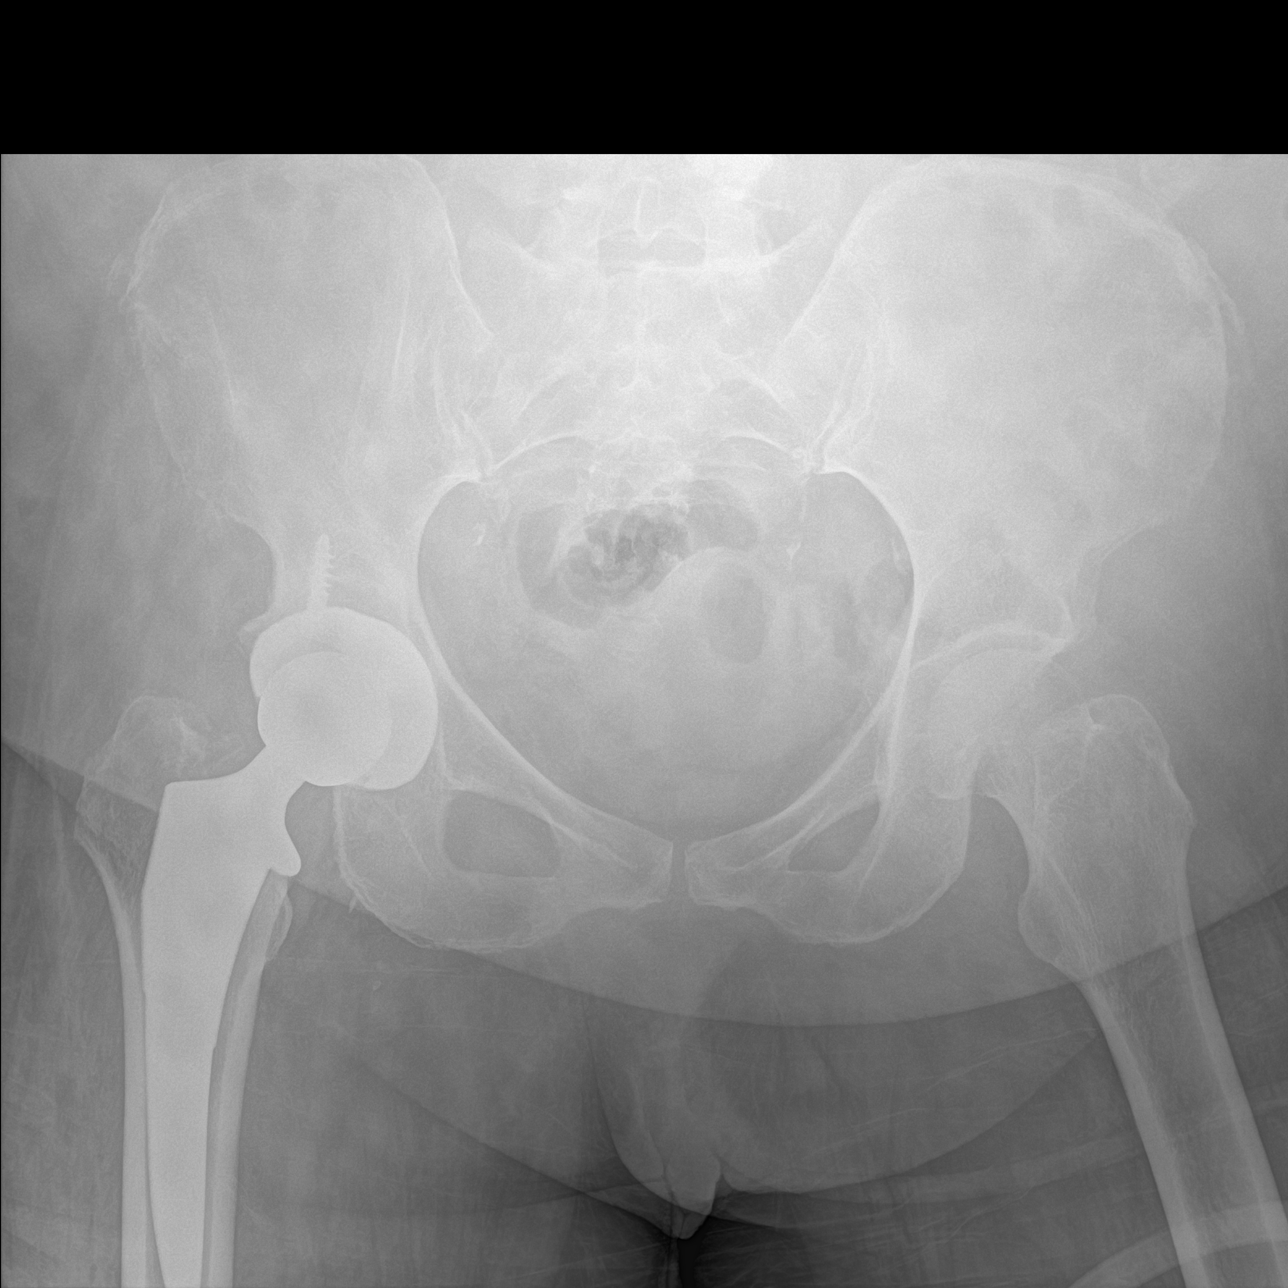

[hip ap]
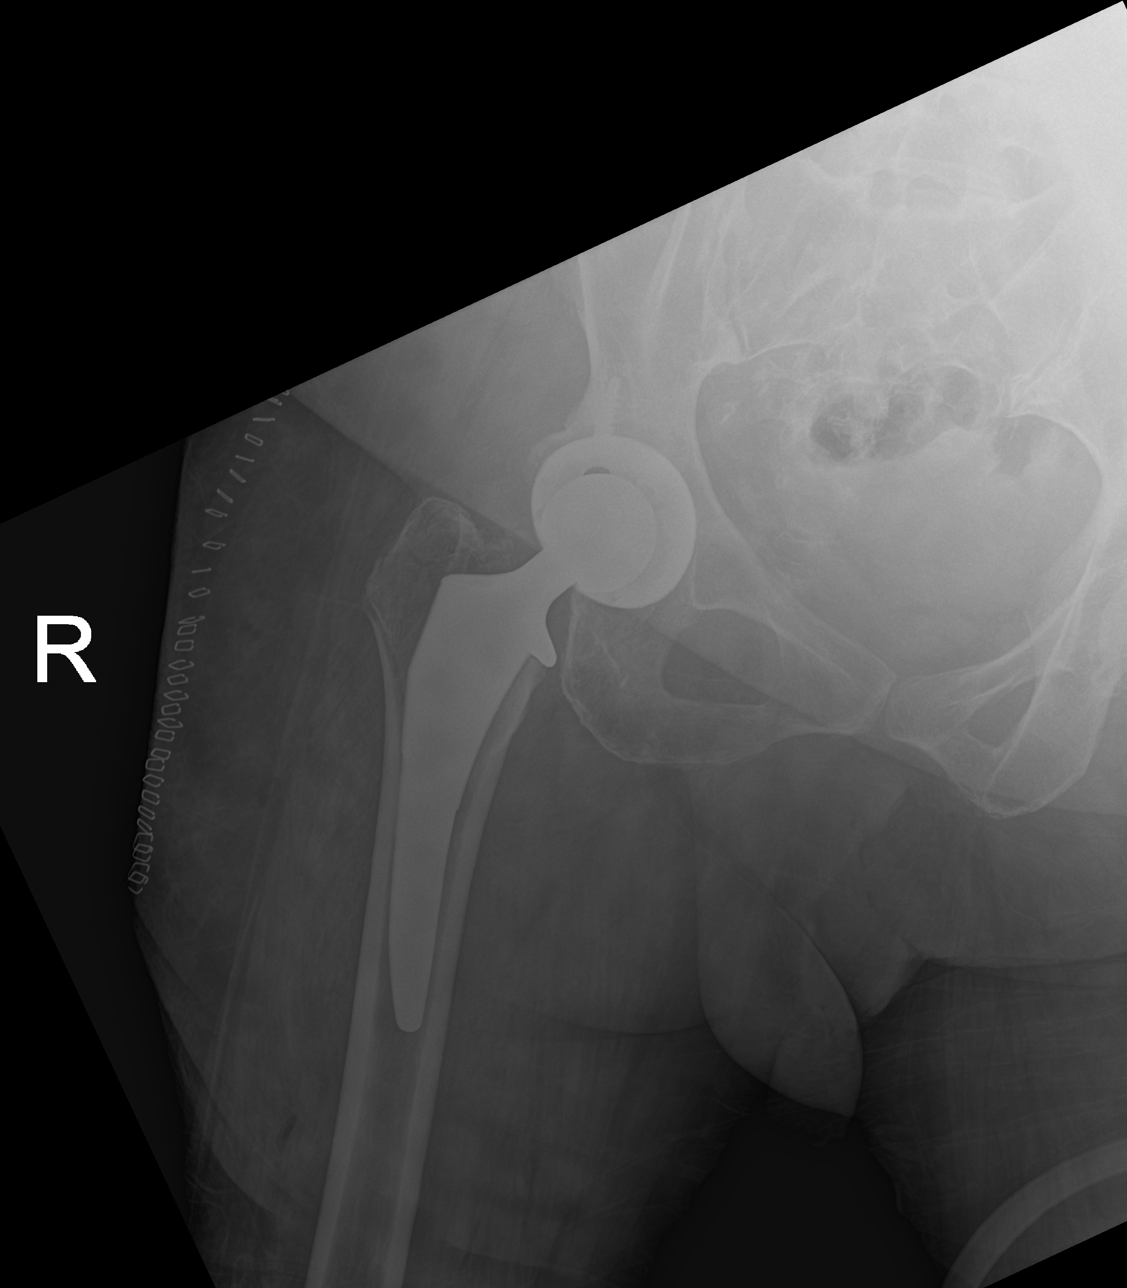

[hip frog leg]
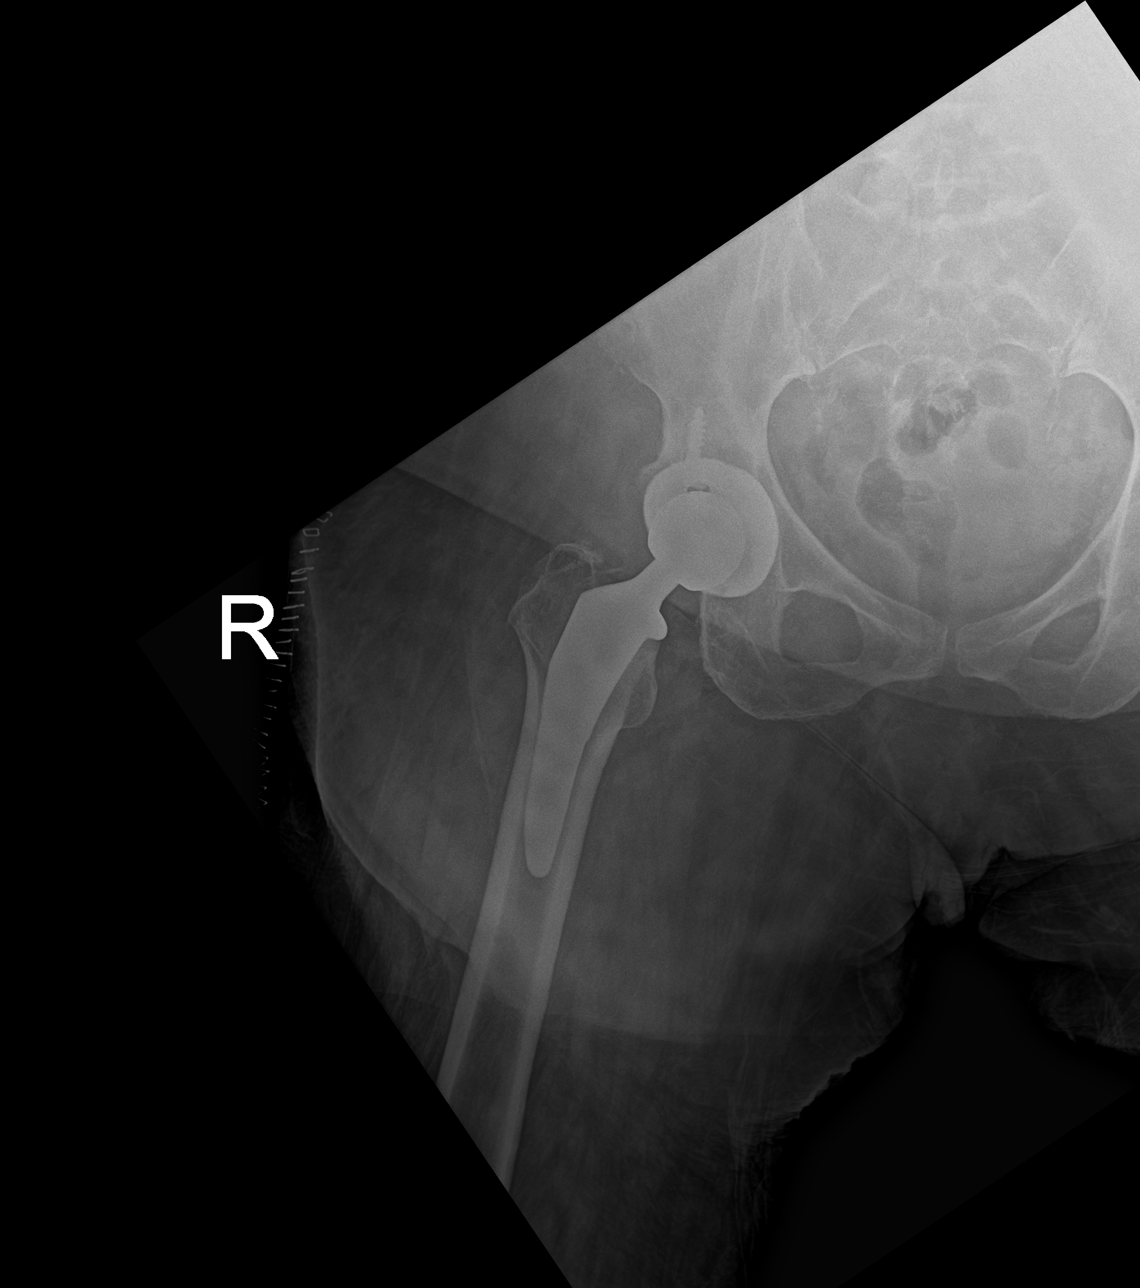

[3 of 3 positions shown; findings below may reference images not displayed]

FINDINGS: Status post right hip total arthroplasty with expected overlying
postoperative changes. No evidence of perihardware fracture or
component malpositioning.
IMPRESSION: Status post right hip total arthroplasty with expected overlying
postoperative changes. No evidence of perihardware fracture or
component malpositioning.

## 2021-02-22 IMAGING — RF DG HIP (WITH PELVIS) OPERATIVE*R*
1 series · 2 of 2 positions shown · non-contrast
Comparison: Radiographs of the right hip [DATE].

CLINICAL DATA: Surgery, elective [0I] ([0I]-CM). Additional
history provided by technologist: Right total hip arthroplasty
anterior approach. Provided fluoroscopy time 32 seconds (5.885 mGy).

EXAM:
OPERATIVE right HIP (WITH PELVIS IF PERFORMED) 2 VIEWS
TECHNIQUE: Fluoroscopic spot image(s) were submitted for interpretation
post-operatively.

[Series 1: unknown protocol · 0.20mm/px · 2 of 2 slices shown]
[im 1/2]
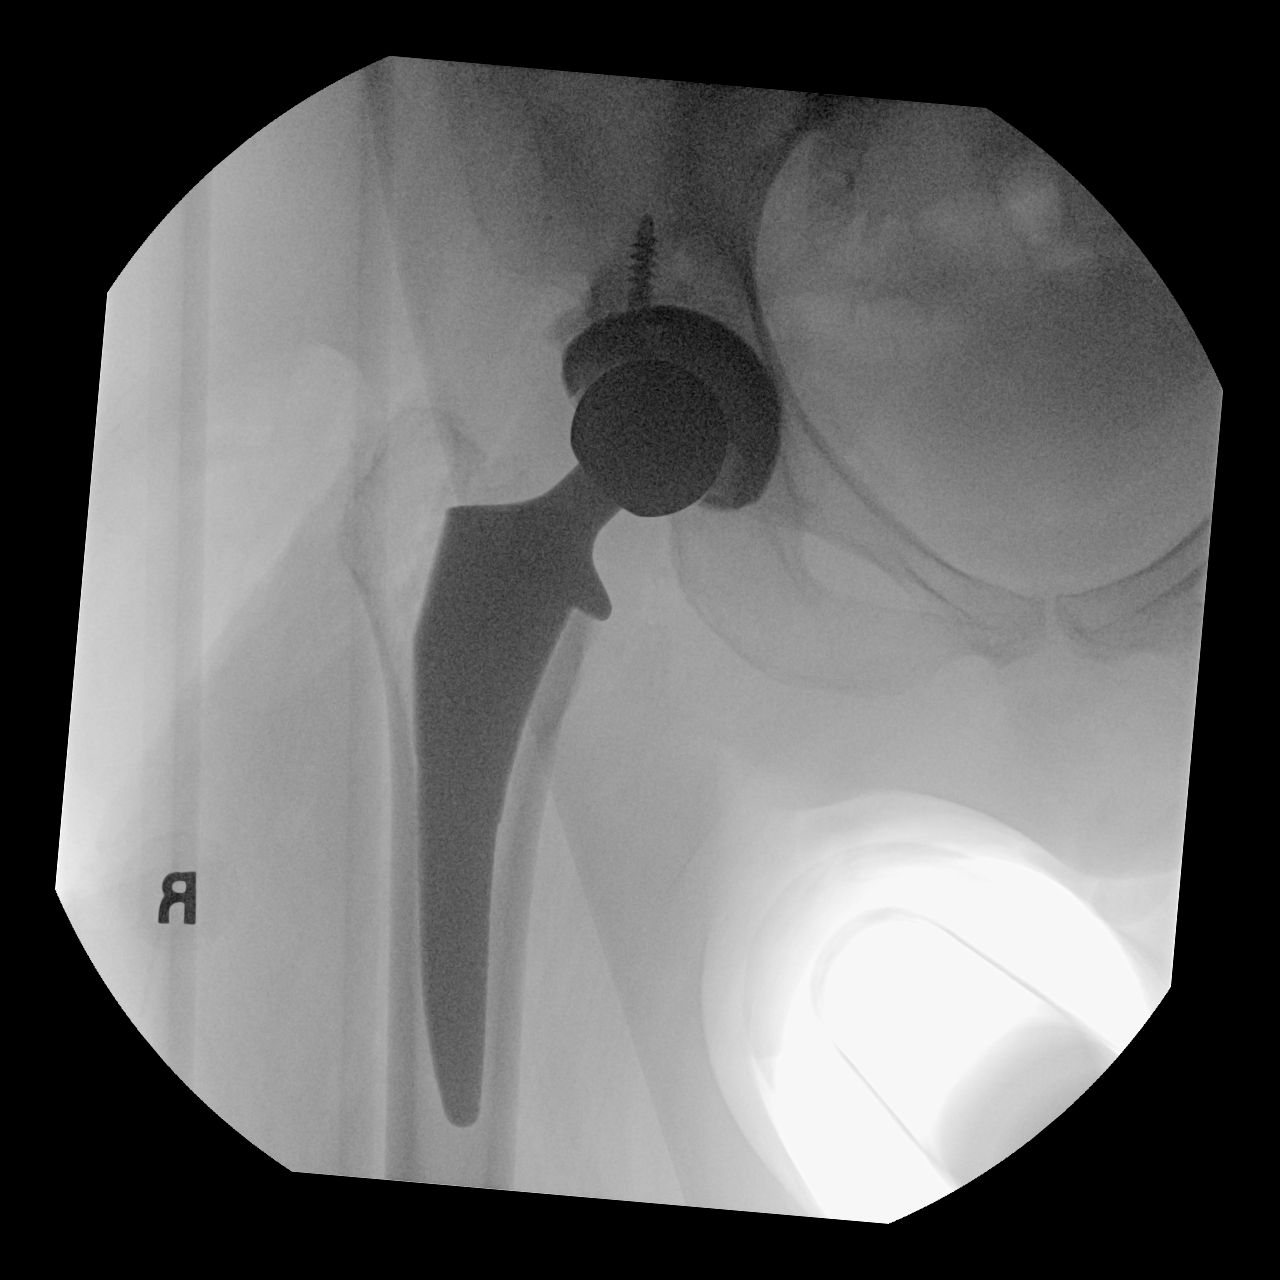
[im 2/2]
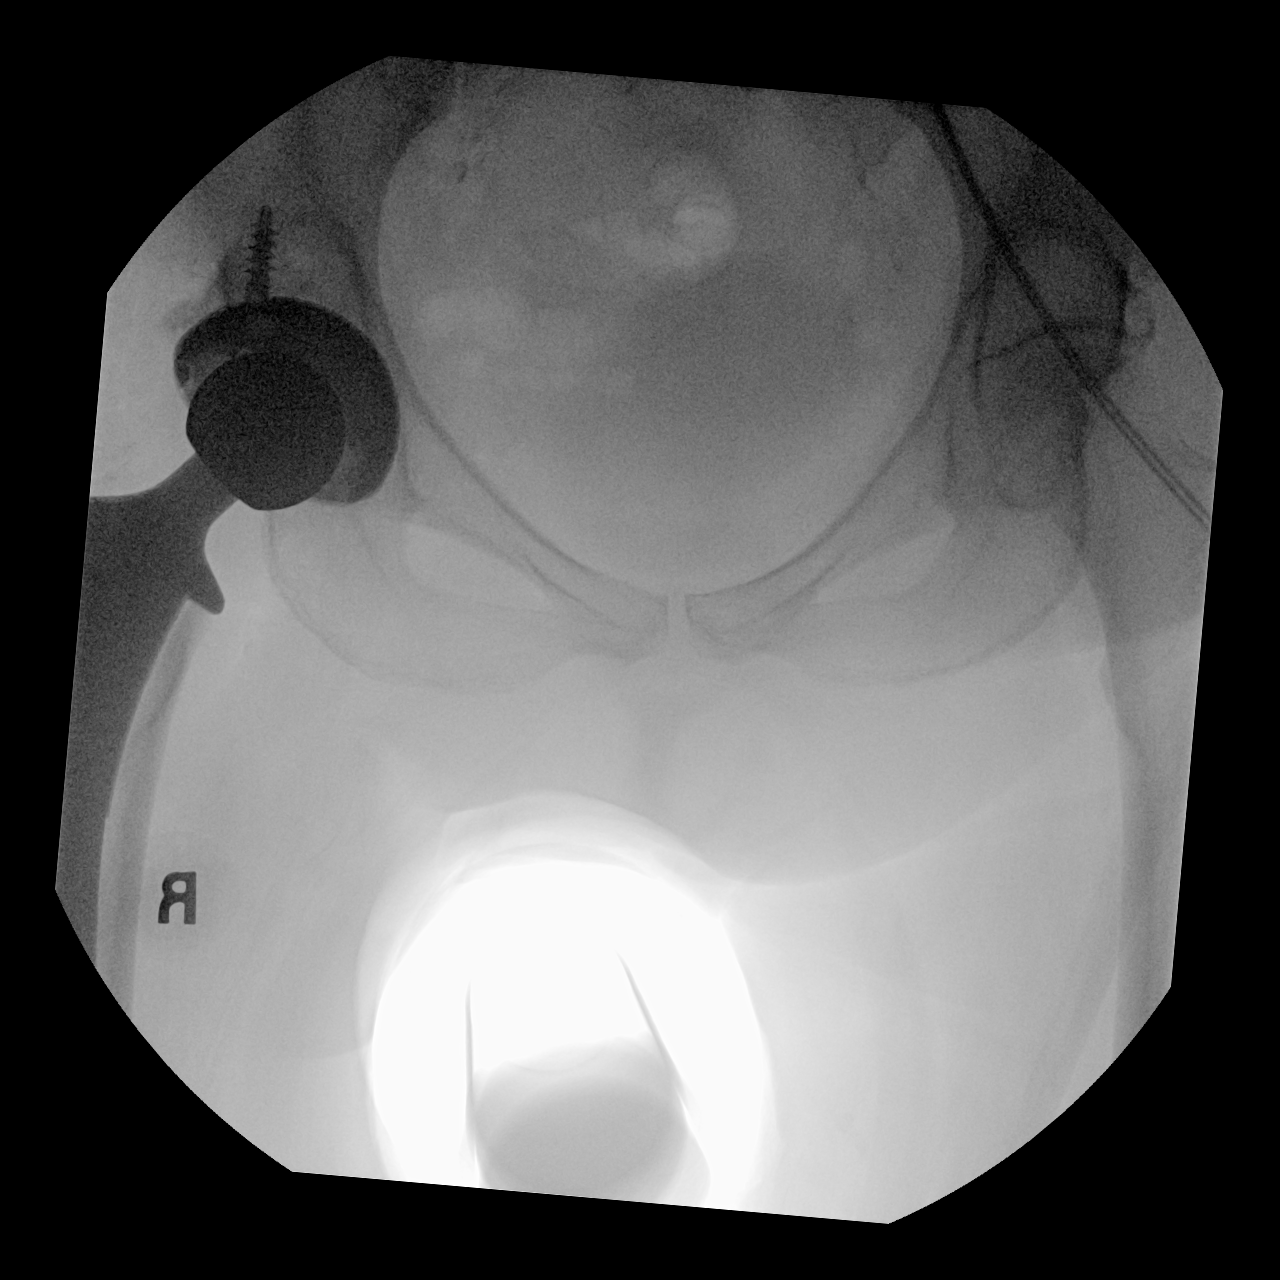

[2 of 2 positions shown; findings below may reference images not displayed]

FINDINGS: Two intraoperative fluoroscopic images of the right hip are
submitted. On the provided images, there are findings of interval
right total hip arthroplasty. The femoral and acetabular components
appear well seated. No unexpected finding.
IMPRESSION: Two intraoperative fluoroscopic images of the right hip from right
total hip arthroplasty.

## 2021-02-22 SURGERY — ARTHROPLASTY, HIP, TOTAL, ANTERIOR APPROACH
Anesthesia: Monitor Anesthesia Care | Site: Hip | Laterality: Right

## 2021-02-22 MED ORDER — SERTRALINE HCL 100 MG PO TABS
100.0000 mg | ORAL_TABLET | Freq: Every day | ORAL | Status: DC
  Administered 2021-02-23 – 2021-02-27 (×5): 100 mg via ORAL
  Filled 2021-02-22 (×5): qty 1

## 2021-02-22 MED ORDER — CHLORHEXIDINE GLUCONATE 0.12 % MT SOLN
15.0000 mL | Freq: Once | OROMUCOSAL | Status: AC
  Administered 2021-02-22: 15 mL via OROMUCOSAL
  Filled 2021-02-22: qty 15

## 2021-02-22 MED ORDER — ONDANSETRON HCL 4 MG/2ML IJ SOLN
INTRAMUSCULAR | Status: DC | PRN
  Administered 2021-02-22: 4 mg via INTRAVENOUS

## 2021-02-22 MED ORDER — BUPIVACAINE HCL (PF) 0.5 % IJ SOLN
INTRAMUSCULAR | Status: AC
  Filled 2021-02-22: qty 30

## 2021-02-22 MED ORDER — FENTANYL CITRATE (PF) 100 MCG/2ML IJ SOLN
25.0000 ug | INTRAMUSCULAR | Status: DC | PRN
  Administered 2021-02-22 (×3): 25 ug via INTRAVENOUS

## 2021-02-22 MED ORDER — MIDAZOLAM HCL 2 MG/2ML IJ SOLN
INTRAMUSCULAR | Status: AC
  Filled 2021-02-22: qty 2

## 2021-02-22 MED ORDER — FUROSEMIDE 20 MG PO TABS
20.0000 mg | ORAL_TABLET | Freq: Every day | ORAL | Status: DC
  Administered 2021-02-23 – 2021-02-25 (×3): 20 mg via ORAL
  Filled 2021-02-22 (×3): qty 1

## 2021-02-22 MED ORDER — MIDAZOLAM HCL 2 MG/2ML IJ SOLN
INTRAMUSCULAR | Status: DC | PRN
  Administered 2021-02-22: 2 mg via INTRAVENOUS

## 2021-02-22 MED ORDER — POTASSIUM CITRATE ER 10 MEQ (1080 MG) PO TBCR
20.0000 meq | EXTENDED_RELEASE_TABLET | Freq: Every day | ORAL | Status: DC
  Administered 2021-02-23 – 2021-02-27 (×5): 20 meq via ORAL
  Filled 2021-02-22 (×5): qty 2

## 2021-02-22 MED ORDER — 0.9 % SODIUM CHLORIDE (POUR BTL) OPTIME
TOPICAL | Status: DC | PRN
  Administered 2021-02-22: 1000 mL

## 2021-02-22 MED ORDER — UMECLIDINIUM-VILANTEROL 62.5-25 MCG/INH IN AEPB
1.0000 | INHALATION_SPRAY | Freq: Every day | RESPIRATORY_TRACT | Status: DC | PRN
  Administered 2021-02-24 – 2021-02-26 (×3): 1 via RESPIRATORY_TRACT
  Filled 2021-02-22 (×2): qty 14

## 2021-02-22 MED ORDER — ONDANSETRON HCL 4 MG PO TABS: 4.0000 mg | ORAL_TABLET | Freq: Four times a day (QID) | ORAL | Status: DC | PRN

## 2021-02-22 MED ORDER — MENTHOL 3 MG MT LOZG: 1.0000 | LOZENGE | OROMUCOSAL | Status: DC | PRN

## 2021-02-22 MED ORDER — BUPIVACAINE LIPOSOME 1.3 % IJ SUSP
INTRAMUSCULAR | Status: DC | PRN
  Administered 2021-02-22: 20 mL

## 2021-02-22 MED ORDER — PROPOFOL 500 MG/50ML IV EMUL
INTRAVENOUS | Status: DC | PRN
  Administered 2021-02-22: 100 ug/kg/min via INTRAVENOUS

## 2021-02-22 MED ORDER — OXYCODONE HCL 5 MG/5ML PO SOLN: 5.0000 mg | Freq: Once | ORAL | Status: DC | PRN

## 2021-02-22 MED ORDER — ALBUMIN HUMAN 5 % IV SOLN: INTRAVENOUS | Status: DC | PRN

## 2021-02-22 MED ORDER — DOCUSATE SODIUM 100 MG PO CAPS
100.0000 mg | ORAL_CAPSULE | Freq: Two times a day (BID) | ORAL | Status: DC
  Administered 2021-02-22 – 2021-02-27 (×8): 100 mg via ORAL
  Filled 2021-02-22 (×10): qty 1

## 2021-02-22 MED ORDER — DEXAMETHASONE SODIUM PHOSPHATE 10 MG/ML IJ SOLN
INTRAMUSCULAR | Status: DC | PRN
  Administered 2021-02-22: 5 mg via INTRAVENOUS

## 2021-02-22 MED ORDER — PROPOFOL 10 MG/ML IV BOLUS
INTRAVENOUS | Status: DC | PRN
  Administered 2021-02-22: 35 mg via INTRAVENOUS

## 2021-02-22 MED ORDER — FENTANYL CITRATE (PF) 250 MCG/5ML IJ SOLN
INTRAMUSCULAR | Status: DC | PRN
  Administered 2021-02-22: 50 ug via INTRAVENOUS

## 2021-02-22 MED ORDER — METHOCARBAMOL 1000 MG/10ML IJ SOLN
500.0000 mg | Freq: Four times a day (QID) | INTRAVENOUS | Status: DC | PRN
  Filled 2021-02-22: qty 5

## 2021-02-22 MED ORDER — CEFAZOLIN SODIUM-DEXTROSE 2-4 GM/100ML-% IV SOLN
2.0000 g | INTRAVENOUS | Status: AC
  Administered 2021-02-22: 2 g via INTRAVENOUS
  Filled 2021-02-22: qty 100

## 2021-02-22 MED ORDER — METOCLOPRAMIDE HCL 5 MG PO TABS: 5.0000 mg | ORAL_TABLET | Freq: Three times a day (TID) | ORAL | Status: DC | PRN

## 2021-02-22 MED ORDER — METHOCARBAMOL 500 MG PO TABS
500.0000 mg | ORAL_TABLET | Freq: Four times a day (QID) | ORAL | Status: DC | PRN
  Administered 2021-02-22 – 2021-02-27 (×10): 500 mg via ORAL
  Filled 2021-02-22 (×11): qty 1

## 2021-02-22 MED ORDER — ALPRAZOLAM 0.5 MG PO TABS
0.5000 mg | ORAL_TABLET | Freq: Three times a day (TID) | ORAL | Status: DC | PRN
  Administered 2021-02-25 – 2021-02-27 (×4): 0.5 mg via ORAL
  Filled 2021-02-22 (×4): qty 1

## 2021-02-22 MED ORDER — PHENOL 1.4 % MT LIQD: 1.0000 | OROMUCOSAL | Status: DC | PRN

## 2021-02-22 MED ORDER — LACTATED RINGERS IV SOLN: INTRAVENOUS | Status: DC

## 2021-02-22 MED ORDER — POLYETHYLENE GLYCOL 3350 17 G PO PACK: 17.0000 g | PACK | Freq: Every day | ORAL | Status: DC | PRN

## 2021-02-22 MED ORDER — OXYCODONE HCL 5 MG PO TABS: 5.0000 mg | ORAL_TABLET | Freq: Once | ORAL | Status: DC | PRN

## 2021-02-22 MED ORDER — TRANEXAMIC ACID-NACL 1000-0.7 MG/100ML-% IV SOLN
INTRAVENOUS | Status: DC | PRN
  Administered 2021-02-22: 1000 mg via INTRAVENOUS

## 2021-02-22 MED ORDER — ORAL CARE MOUTH RINSE: 15.0000 mL | Freq: Once | OROMUCOSAL | Status: AC

## 2021-02-22 MED ORDER — HYDROMORPHONE HCL 1 MG/ML IJ SOLN
0.5000 mg | INTRAMUSCULAR | Status: DC | PRN
  Administered 2021-02-22 – 2021-02-23 (×3): 0.5 mg via INTRAVENOUS
  Filled 2021-02-22 (×3): qty 0.5

## 2021-02-22 MED ORDER — BUPIVACAINE LIPOSOME 1.3 % IJ SUSP
INTRAMUSCULAR | Status: AC
  Filled 2021-02-22: qty 20

## 2021-02-22 MED ORDER — FENTANYL CITRATE (PF) 100 MCG/2ML IJ SOLN
INTRAMUSCULAR | Status: AC
  Filled 2021-02-22: qty 2

## 2021-02-22 MED ORDER — SODIUM CHLORIDE 0.9 % IV SOLN: INTRAVENOUS | Status: DC

## 2021-02-22 MED ORDER — MEPERIDINE HCL 25 MG/ML IJ SOLN: 6.2500 mg | INTRAMUSCULAR | Status: DC | PRN

## 2021-02-22 MED ORDER — TRANEXAMIC ACID-NACL 1000-0.7 MG/100ML-% IV SOLN
INTRAVENOUS | Status: AC
  Filled 2021-02-22: qty 100

## 2021-02-22 MED ORDER — PHENYLEPHRINE HCL-NACL 10-0.9 MG/250ML-% IV SOLN
INTRAVENOUS | Status: DC | PRN
  Administered 2021-02-22: 40 ug/min via INTRAVENOUS

## 2021-02-22 MED ORDER — ONDANSETRON HCL 4 MG/2ML IJ SOLN: 4.0000 mg | Freq: Four times a day (QID) | INTRAMUSCULAR | Status: DC | PRN

## 2021-02-22 MED ORDER — FENTANYL CITRATE (PF) 250 MCG/5ML IJ SOLN
INTRAMUSCULAR | Status: AC
  Filled 2021-02-22: qty 5

## 2021-02-22 MED ORDER — ACETAMINOPHEN 160 MG/5ML PO SOLN: 325.0000 mg | ORAL | Status: DC | PRN

## 2021-02-22 MED ORDER — PANTOPRAZOLE SODIUM 40 MG PO TBEC
40.0000 mg | DELAYED_RELEASE_TABLET | Freq: Every day | ORAL | Status: DC
  Administered 2021-02-23 – 2021-02-27 (×5): 40 mg via ORAL
  Filled 2021-02-22 (×5): qty 1

## 2021-02-22 MED ORDER — BUPIVACAINE IN DEXTROSE 0.75-8.25 % IT SOLN
INTRATHECAL | Status: DC | PRN
  Administered 2021-02-22: 1.8 mL via INTRATHECAL

## 2021-02-22 MED ORDER — ACETAMINOPHEN 325 MG PO TABS: 325.0000 mg | ORAL_TABLET | ORAL | Status: DC | PRN

## 2021-02-22 MED ORDER — ACETAMINOPHEN 325 MG PO TABS: 325.0000 mg | ORAL_TABLET | Freq: Four times a day (QID) | ORAL | Status: DC | PRN

## 2021-02-22 MED ORDER — ALBUTEROL SULFATE (2.5 MG/3ML) 0.083% IN NEBU: 3.0000 mL | INHALATION_SOLUTION | Freq: Four times a day (QID) | RESPIRATORY_TRACT | Status: DC | PRN

## 2021-02-22 MED ORDER — GABAPENTIN 300 MG PO CAPS
300.0000 mg | ORAL_CAPSULE | Freq: Every day | ORAL | Status: DC
  Administered 2021-02-22 – 2021-02-26 (×5): 300 mg via ORAL
  Filled 2021-02-22 (×5): qty 1

## 2021-02-22 MED ORDER — BUPIVACAINE-EPINEPHRINE (PF) 0.25% -1:200000 IJ SOLN
INTRAMUSCULAR | Status: AC
  Filled 2021-02-22: qty 30

## 2021-02-22 MED ORDER — METOCLOPRAMIDE HCL 5 MG/ML IJ SOLN: 5.0000 mg | Freq: Three times a day (TID) | INTRAMUSCULAR | Status: DC | PRN

## 2021-02-22 MED ORDER — OXYCODONE HCL 5 MG PO TABS
5.0000 mg | ORAL_TABLET | ORAL | Status: DC | PRN
  Administered 2021-02-22 – 2021-02-23 (×3): 10 mg via ORAL
  Filled 2021-02-22 (×3): qty 2

## 2021-02-22 MED ORDER — ONDANSETRON HCL 4 MG/2ML IJ SOLN: 4.0000 mg | Freq: Once | INTRAMUSCULAR | Status: DC | PRN

## 2021-02-22 MED ORDER — POVIDONE-IODINE 10 % EX SWAB: 2.0000 "application " | Freq: Once | CUTANEOUS | Status: DC

## 2021-02-22 MED ORDER — ASPIRIN EC 325 MG PO TBEC
325.0000 mg | DELAYED_RELEASE_TABLET | Freq: Every day | ORAL | Status: DC
  Administered 2021-02-23 – 2021-02-27 (×5): 325 mg via ORAL
  Filled 2021-02-22 (×5): qty 1

## 2021-02-22 MED ORDER — HYDROCHLOROTHIAZIDE 12.5 MG PO CAPS
12.5000 mg | ORAL_CAPSULE | Freq: Every day | ORAL | Status: DC
  Administered 2021-02-23 – 2021-02-27 (×5): 12.5 mg via ORAL
  Filled 2021-02-22 (×5): qty 1

## 2021-02-22 MED ORDER — LIDOCAINE 2% (20 MG/ML) 5 ML SYRINGE
INTRAMUSCULAR | Status: DC | PRN
  Administered 2021-02-22: 40 mg via INTRAVENOUS

## 2021-02-22 SURGICAL SUPPLY — 53 items
BAG COUNTER SPONGE SURGICOUNT (BAG) ×2 IMPLANT
BENZOIN TINCTURE PRP APPL 2/3 (GAUZE/BANDAGES/DRESSINGS) ×2 IMPLANT
BLADE CLIPPER SURG (BLADE) IMPLANT
BLADE SAW SGTL 18X1.27X75 (BLADE) ×2 IMPLANT
CATH FOLEY 2WAY SLVR 30CC 16FR (CATHETERS) ×2 IMPLANT
CELLS DAT CNTRL 66122 CELL SVR (MISCELLANEOUS) IMPLANT
COVER SURGICAL LIGHT HANDLE (MISCELLANEOUS) ×2 IMPLANT
CUP SECTOR GRIPTON 50MM (Cup) ×2 IMPLANT
DRAPE C-ARM 42X72 X-RAY (DRAPES) ×2 IMPLANT
DRAPE IMP U-DRAPE 54X76 (DRAPES) ×2 IMPLANT
DRAPE STERI IOBAN 125X83 (DRAPES) ×2 IMPLANT
DRAPE U-SHAPE 47X51 STRL (DRAPES) ×6 IMPLANT
DRSG MEPILEX BORD LITE 2X5 (GAUZE/BANDAGES/DRESSINGS) ×2 IMPLANT
DRSG MEPILEX BORDER 4X8 (GAUZE/BANDAGES/DRESSINGS) ×2 IMPLANT
DURAPREP 26ML APPLICATOR (WOUND CARE) ×2 IMPLANT
ELECT BLADE 4.0 EZ CLEAN MEGAD (MISCELLANEOUS)
ELECT CAUTERY BLADE 6.4 (BLADE) ×2 IMPLANT
ELECT REM PT RETURN 9FT ADLT (ELECTROSURGICAL) ×2
ELECTRODE BLDE 4.0 EZ CLN MEGD (MISCELLANEOUS) IMPLANT
ELECTRODE REM PT RTRN 9FT ADLT (ELECTROSURGICAL) ×1 IMPLANT
ELIMINATOR HOLE APEX DEPUY (Hips) ×2 IMPLANT
FACESHIELD WRAPAROUND (MASK) ×4 IMPLANT
GLOVE SRG 8 PF TXTR STRL LF DI (GLOVE) ×2 IMPLANT
GLOVE SURG ORTHO LTX SZ7.5 (GLOVE) ×4 IMPLANT
GLOVE SURG UNDER POLY LF SZ8 (GLOVE) ×4
GOWN STRL REUS W/ TWL LRG LVL3 (GOWN DISPOSABLE) ×1 IMPLANT
GOWN STRL REUS W/ TWL XL LVL3 (GOWN DISPOSABLE) ×1 IMPLANT
GOWN STRL REUS W/TWL 2XL LVL3 (GOWN DISPOSABLE) ×2 IMPLANT
GOWN STRL REUS W/TWL LRG LVL3 (GOWN DISPOSABLE) ×2
GOWN STRL REUS W/TWL XL LVL3 (GOWN DISPOSABLE) ×2
HEAD FEM STD 32X+5 STRL (Hips) ×2 IMPLANT
KIT BASIN OR (CUSTOM PROCEDURE TRAY) ×2 IMPLANT
KIT TURNOVER KIT B (KITS) ×2 IMPLANT
LINER ACET PNNCL PLUS4 NEUTRAL (Hips) ×1 IMPLANT
MANIFOLD NEPTUNE II (INSTRUMENTS) ×2 IMPLANT
NS IRRIG 1000ML POUR BTL (IV SOLUTION) ×2 IMPLANT
PACK TOTAL JOINT (CUSTOM PROCEDURE TRAY) ×2 IMPLANT
PAD ARMBOARD 7.5X6 YLW CONV (MISCELLANEOUS) ×4 IMPLANT
PINNACLE PLUS 4 NEUTRAL (Hips) ×2 IMPLANT
RTRCTR WOUND ALEXIS 18CM MED (MISCELLANEOUS)
SCREW PINN CAN 6.5X20 (Screw) ×2 IMPLANT
STAPLER SKIN PROX 35W (STAPLE) ×2 IMPLANT
STEM FEMORAL SZ 6MM STD ACTIS (Stem) ×2 IMPLANT
STRIP CLOSURE SKIN 1/2X4 (GAUZE/BANDAGES/DRESSINGS) ×2 IMPLANT
SUT VIC AB 0 CT1 27 (SUTURE) ×4
SUT VIC AB 0 CT1 27XBRD ANBCTR (SUTURE) ×2 IMPLANT
SUT VIC AB 2-0 CT1 27 (SUTURE) ×4
SUT VIC AB 2-0 CT1 TAPERPNT 27 (SUTURE) ×2 IMPLANT
SUT VICRYL 4-0 PS2 18IN ABS (SUTURE) ×2 IMPLANT
SUT VLOC 180 0 24IN GS25 (SUTURE) ×2 IMPLANT
TOWEL GREEN STERILE (TOWEL DISPOSABLE) ×4 IMPLANT
TOWEL GREEN STERILE FF (TOWEL DISPOSABLE) ×2 IMPLANT
WATER STERILE IRR 1000ML POUR (IV SOLUTION) ×4 IMPLANT

## 2021-02-22 NOTE — Transfer of Care (Signed)
Immediate Anesthesia Transfer of Care Note  Patient: Bronson Ing  Procedure(s) Performed: RIGHT TOTAL HIP ARTHROPLASTY ANTERIOR APPROACH (Right: Hip)  Patient Location: PACU  Anesthesia Type:Spinal  Level of Consciousness: drowsy  Airway & Oxygen Therapy: Patient Spontanous Breathing and Patient connected to face mask oxygen  Post-op Assessment: Report given to RN and Post -op Vital signs reviewed and stable  Post vital signs: Reviewed and stable  Last Vitals:  Vitals Value Taken Time  BP 129/60 02/22/21 1513  Temp    Pulse 57 02/22/21 1515  Resp 16 02/22/21 1515  SpO2 100 % 02/22/21 1515  Vitals shown include unvalidated device data.  Last Pain:  Vitals:   02/22/21 1109  TempSrc:   PainSc: 9          Complications: No notable events documented.

## 2021-02-22 NOTE — Progress Notes (Signed)
70 year old white female history of end-stage DJD right hip and pain comes in for preop evaluation.  States that symptoms unchanged from previous visit.  She is wanting to proceed with right total hip replacement as scheduled.  Today history and physical performed.  Patient admits to having a cough.  She has been given clearance from pulmonologist Dr. Christinia Gully.  He started her on Zithromax preop.  Surgical procedure discussed.  All questions answered.

## 2021-02-22 NOTE — Anesthesia Postprocedure Evaluation (Signed)
Anesthesia Post Note  Patient: Regina Mcmahon  Procedure(s) Performed: RIGHT TOTAL HIP ARTHROPLASTY ANTERIOR APPROACH (Right: Hip)     Patient location during evaluation: PACU Anesthesia Type: MAC Level of consciousness: oriented and awake and alert Pain management: pain level controlled Vital Signs Assessment: post-procedure vital signs reviewed and stable Respiratory status: spontaneous breathing, respiratory function stable and patient connected to nasal cannula oxygen Cardiovascular status: blood pressure returned to baseline and stable Postop Assessment: no headache, no backache and no apparent nausea or vomiting Anesthetic complications: no   No notable events documented.  Last Vitals:  Vitals:   02/22/21 1051 02/22/21 1513  BP: (!) 158/64 129/60  Pulse: 62 (!) 58  Resp: 18 17  Temp: 36.4 C 36.4 C  SpO2: 93% 97%    Last Pain:  Vitals:   02/22/21 1513  TempSrc:   PainSc: 0-No pain                 Karlee Staff

## 2021-02-22 NOTE — Discharge Instructions (Addendum)
INSTRUCTIONS AFTER TOTAL HIP JOINT REPLACEMENT   Remove items at home which could result in a fall. This includes throw rugs or furniture in walking pathways ICE to the affected joint every three hours while awake for 30 minutes at a time, for at least the first 3-5 days, and then as needed for pain and swelling.  Continue to use ice for pain and swelling. You may notice swelling that will progress down to the foot and ankle.  This is normal after surgery.  Elevate your leg when you are not up walking on it.   Continue to use the breathing machine you got in the hospital (incentive spirometer) which will help keep your temperature down.  It is common for your temperature to cycle up and down following surgery, especially at night when you are not up moving around and exerting yourself.  The breathing machine keeps your lungs expanded and your temperature down.   DIET:  As you were doing prior to hospitalization, we recommend a well-balanced diet.  DRESSING / WOUND CARE / SHOWERING  You may change your dressing 3-5 days after surgery.  Then change the dressing every day with sterile gauze.  Please use good hand washing techniques before changing the dressing.  Do not use any lotions or creams on the incision until instructed by your surgeon.  ACTIVITY  Increase activity slowly as tolerated, but follow the weight bearing instructions below.   No driving for 6 weeks or until further direction given by your physician.  You cannot drive while taking narcotics.  No lifting or carrying greater than 10 lbs. until further directed by your surgeon. Avoid periods of inactivity such as sitting longer than an hour when not asleep. This helps prevent blood clots.  You may return to work once you are authorized by your doctor.     WEIGHT BEARING   Weight bearing as tolerated with assist device (walker, cane, etc) as directed, use it as long as suggested by your surgeon or therapist, typically at least 4-6  weeks.   EXERCISES  Results after joint replacement surgery are often greatly improved when you follow the exercise, range of motion and muscle strengthening exercises prescribed by your doctor. Safety measures are also important to protect the joint from further injury. Any time any of these exercises cause you to have increased pain or swelling, decrease what you are doing until you are comfortable again and then slowly increase them. If you have problems or questions, call your caregiver or physical therapist for advice.   Rehabilitation is important following a joint replacement. After just a few days of immobilization, the muscles of the leg can become weakened and shrink (atrophy).  These exercises are designed to build up the tone and strength of the thigh and leg muscles and to improve motion. Often times heat used for twenty to thirty minutes before working out will loosen up your tissues and help with improving the range of motion but do not use heat for the first two weeks following surgery (sometimes heat can increase post-operative swelling).   These exercises can be done on a training (exercise) mat, on the floor, on a table or on a bed. Use whatever works the best and is most comfortable for you.    Use music or television while you are exercising so that the exercises are a pleasant break in your day. This will make your life better with the exercises acting as a break in your routine that you can  look forward to.   Perform all exercises about fifteen times, three times per day or as directed.  You should exercise both the operative leg and the other leg as well.   A rehabilitation program following joint replacement surgery can speed recovery and prevent re-injury in the future due to weakened muscles. Contact your doctor or a physical therapist for more information on knee rehabilitation.    CONSTIPATION  Constipation is defined medically as fewer than three stools per week and  severe constipation as less than one stool per week.  Even if you have a regular bowel pattern at home, your normal regimen is likely to be disrupted due to multiple reasons following surgery.  Combination of anesthesia, postoperative narcotics, change in appetite and fluid intake all can affect your bowels.   YOU MUST use at least one of the following options; they are listed in order of increasing strength to get the job done.  They are all available over the counter, and you may need to use some, POSSIBLY even all of these options:    Drink plenty of fluids (prune juice may be helpful) and high fiber foods Colace 100 mg by mouth twice a day  Senokot for constipation as directed and as needed Dulcolax (bisacodyl), take with full glass of water  Miralax (polyethylene glycol) once or twice a day as needed.  If you have tried all these things and are unable to have a bowel movement in the first 3-4 days after surgery call either your surgeon or your primary doctor.    If you experience loose stools or diarrhea, hold the medications until you stool forms back up.  If your symptoms do not get better within 1 week or if they get worse, check with your doctor.  If you experience "the worst abdominal pain ever" or develop nausea or vomiting, please contact the office immediately for further recommendations for treatment.   ITCHING:  If you experience itching with your medications, try taking only a single pain pill, or even half a pain pill at a time.  You can also use Benadryl over the counter for itching or also to help with sleep.   TED HOSE STOCKINGS:  Use stockings on both legs until for at least 2 weeks or as directed by physician office. They may be removed at night for sleeping.  MEDICATIONS:  See your medication summary on the "After Visit Summary" that nursing will review with you.  You may have some home medications which will be placed on hold until you complete the course of blood thinner  medication.  It is important for you to complete the blood thinner medication as prescribed.  PRECAUTIONS:  If you experience chest pain or shortness of breath - call 911 immediately for transfer to the hospital emergency department.   If you develop a fever greater that 101 F, purulent drainage from wound, increased redness or drainage from wound, foul odor from the wound/dressing, or calf pain - CONTACT YOUR SURGEON.                                                   FOLLOW-UP APPOINTMENTS:  If you do not already have a post-op appointment, please call the office for an appointment to be seen by your surgeon.  Guidelines for how soon to be seen are listed in  your "After Visit Summary", but are typically between 1-4 weeks after surgery.  OTHER INSTRUCTIONS:   Knee Replacement:  Do not place pillow under knee, focus on keeping the knee straight while resting. CPM instructions: 0-90 degrees, 2 hours in the morning, 2 hours in the afternoon, and 2 hours in the evening. Place foam block, curve side up under heel at all times except when in CPM or when walking.  DO NOT modify, tear, cut, or change the foam block in any way.  POST-OPERATIVE OPIOID TAPER INSTRUCTIONS: It is important to wean off of your opioid medication as soon as possible. If you do not need pain medication after your surgery it is ok to stop day one. Opioids include: Codeine, Hydrocodone(Norco, Vicodin), Oxycodone(Percocet, oxycontin) and hydromorphone amongst others.  Long term and even short term use of opiods can cause: Increased pain response Dependence Constipation Depression Respiratory depression And more.  Withdrawal symptoms can include Flu like symptoms Nausea, vomiting And more Techniques to manage these symptoms Hydrate well Eat regular healthy meals Stay active Use relaxation techniques(deep breathing, meditating, yoga) Do Not substitute Alcohol to help with tapering If you have been on opioids for less than  two weeks and do not have pain than it is ok to stop all together.  Plan to wean off of opioids This plan should start within one week post op of your joint replacement. Maintain the same interval or time between taking each dose and first decrease the dose.  Cut the total daily intake of opioids by one tablet each day Next start to increase the time between doses. The last dose that should be eliminated is the evening dose.   MAKE SURE YOU:  Understand these instructions.  Get help right away if you are not doing well or get worse.    Thank you for letting us be a part of your medical care team.  It is a privilege we respect greatly.  We hope these instructions will help you stay on track for a fast and full recovery!    Dental Antibiotics:  In most cases prophylactic antibiotics for Dental procdeures after total joint surgery are not necessary.  Exceptions are as follows:  1. History of prior total joint infection  2. Severely immunocompromised (Organ Transplant, cancer chemotherapy, Rheumatoid biologic meds such as [place])  3. Poorly controlled diabetes (A1C &gt; 8.0, blood glucose over 200)  If you have one of these conditions, contact your surgeon for an antibiotic prescription, prior to your dental procedure.

## 2021-02-22 NOTE — Anesthesia Procedure Notes (Signed)
Procedure Name: MAC Date/Time: 02/22/2021 12:43 PM Performed by: Reece Agar, CRNA Pre-anesthesia Checklist: Patient identified, Emergency Drugs available, Suction available, Patient being monitored and Timeout performed Patient Re-evaluated:Patient Re-evaluated prior to induction Oxygen Delivery Method: Simple face mask

## 2021-02-22 NOTE — Interval H&P Note (Signed)
History and Physical Interval Note:  02/22/2021 12:16 PM  Regina Mcmahon  has presented today for surgery, with the diagnosis of right hip osteoarthritis.  The various methods of treatment have been discussed with the patient and family. After consideration of risks, benefits and other options for treatment, the patient has consented to  Procedure(s): RIGHT TOTAL HIP ARTHROPLASTY ANTERIOR APPROACH (Right) as a surgical intervention.  The patient's history has been reviewed, patient examined, no change in status, stable for surgery.  I have reviewed the patient's chart and labs.  Questions were answered to the patient's satisfaction.     Marybelle Killings

## 2021-02-22 NOTE — Op Note (Signed)
Preop diagnosis: Right hip osteoarthritis  Postop diagnosis: Same  Procedure: Right total hip arthroplasty, direct anterior approach.  Surgeon: Lorin Mercy MD  Anesthesia: Spinal plus Exparel and Marcaine.  EBL: 750 mL.  Prosthesis:ELIMINATOR [place] DEPUY - [id number]  Inventory Item: [facility] APEX [place] Serial no.:  Model/Cat no.: [id number]  Implant name: [place] APEX DEPUY - [id number] Laterality: Right Area: Hip  Manufacturer: [place] Date of Manufacture:    Action: Implanted Number Used: 1   Device Identifier:  Device Identifier Type:     CUP SECTOR GRIPTON 50MM - [id number]  Inventory Item: CUP SECTOR GRIPTON 50MM Serial no.:  Model/Cat no.: [id number]  Implant name: CUP SECTOR GRIPTON 50MM - [id number] Laterality: Right Area: Hip  Manufacturer: [place] Date of Manufacture:    Action: Implanted Number Used: 1   Device Identifier:  Device Identifier Type:     SCREW 20MM - [id number]  Inventory Item: SCREW 20MM Serial no.:  Model/Cat no.: [id number]  Implant name: SCREW 20MM - [id number] Laterality: Right Area: Hip  Manufacturer: [place] Date of Manufacture:    Action: Implanted Number Used: 1   Device Identifier:  Device Identifier Type:     PINNACLE PLUS 4 NEUTRAL - [id number]  Inventory Item: PINNACLE PLUS 4 NEUTRAL Serial no.:  Model/Cat no.: [id number]  Implant name: PINNACLE PLUS 4 NEUTRAL - [id number] Laterality: Right Area: Hip  Manufacturer: [place] Date of Manufacture:    Action: Implanted Number Used: 1   Device Identifier:  Device Identifier Type:     STEM FEMORAL SZ 6MM STD ACTIS - [id number]  Inventory Item: STEM FEMORAL SZ 6MM STD ACTIS Serial no.:  Model/Cat no.: [id number]  Implant name: STEM FEMORAL SZ 6MM STD ACTIS - [id number] Laterality: Right Area: Hip  Manufacturer: [place] Date of Manufacture:    Action: Implanted Number Used: 1   Device Identifier:  Device Identifier Type:     HIP BALL  ARTICU DEPUY - [id number]  Inventory Item: HIP BALL ARTICU DEPUY Serial no.:  Model/Cat no.: [id number]  Implant name: HIP BALL ARTICU DEPUY - [id number] Laterality: Right Area: Hip  Manufacturer: [place] Date of Manufacture:    Action: Implanted Number Used: 1   Device Identifier:  Device Identifier Type:    Procedure: After induction of spinal anesthesia Foley catheter placement patient was transferred to the [place] table.  BMI was just under 40 and abdomen was taped across for exposure of the groin.  1015 drapes were applied and Unna boots were applied meticulously and heels were down.  During the case twice we had to bring the leg back up after reducing the hip and then reinsert the foot down to the bottom of the boot since it would begin to slip out.  After initial position prepped with DuraPrep the usual direct anterior total hip sheets drapes sterile Betadine Steri-Drape was applied timeout procedure completed.  Incision was made 1 fingerbreadth lateral 1 inferior to the ASIS obliquely the trochanter.  Fascia was nicked extended.  Dull cobra placed over the top of the capsule transverse bleeders were coagulated.  Large amount of fat over the top the capsule was excised capsule was opened and the deltoid was placed inside.  Neck was cut with sterile visualization slightly less than 11 mm neck.  Preparation of the acetabulum was performed progressing up to 49 mm reamer for a 50 mm cup.  We selected Duraloc with option of dome holes and a single screw was placed although after repeat reaming followed  the cup seem to fit fairly securely it was felt an extra screw was needed for securement.  Apex hole limiter was placed tightened down extremely tight.  Screw was below the level of the cup was not prominent and the +4 neutral liner clicked in securely.  Then progressed to the femoral side.  Hydraulic [place] Axon was applied leg was externally rotated 120 taken down and under and then preparation of the femur  using the large greater trochanteric retractor and the Mueller retractor medially.  We progressed up to a #5 trials were with +1.5 and a +5 neck.  +5 might of been a millimeter long or 2 mm long but look like it restored offset and restored length and preoperatively she was short and there was trace shock with a 5 mm.  Permanent #5 stem was inserted it was checked AP and lateral.  +5 metal ball was inserted impacted hip was reduced good stability.  Copious irrigation V-Loc closure 2 on subtenons tissue Exparel Marcaine infiltration skin closure and postop dressing.  Instrument count needle count was correct.

## 2021-02-22 NOTE — Anesthesia Procedure Notes (Signed)
Spinal  Patient location during procedure: OR Start time: 02/22/2021 12:45 PM End time: 02/22/2021 12:48 PM Reason for block: surgical anesthesia Staffing Anesthesiologist: Janeece Riggers, MD Preanesthetic Checklist Completed: patient identified, IV checked, site marked, risks and benefits discussed, surgical consent, monitors and equipment checked, pre-op evaluation and timeout performed Spinal Block Patient position: sitting Prep: DuraPrep Patient monitoring: heart rate, cardiac monitor, continuous pulse ox and blood pressure Approach: midline Location: L3-4 Injection technique: single-shot Needle Needle type: Sprotte  Needle gauge: 24 G Needle length: 9 cm Assessment Sensory level: T4 Events: CSF return Additional Notes 3 passes at L2/3 change to L 3/4 +CSF / DOSE /+ CSF

## 2021-02-22 NOTE — Plan of Care (Signed)

## 2021-02-23 ENCOUNTER — Encounter (HOSPITAL_COMMUNITY): Payer: Self-pay | Admitting: Orthopaedic Surgery

## 2021-02-23 ENCOUNTER — Telehealth: Payer: Self-pay | Admitting: Orthopaedic Surgery

## 2021-02-23 ENCOUNTER — Inpatient Hospital Stay (HOSPITAL_COMMUNITY): Payer: Medicare HMO

## 2021-02-23 DIAGNOSIS — Z808 Family history of malignant neoplasm of other organs or systems: Secondary | ICD-10-CM | POA: Diagnosis not present

## 2021-02-23 DIAGNOSIS — J810 Acute pulmonary edema: Secondary | ICD-10-CM | POA: Diagnosis not present

## 2021-02-23 DIAGNOSIS — Z800 Family history of malignant neoplasm of digestive organs: Secondary | ICD-10-CM | POA: Diagnosis not present

## 2021-02-23 DIAGNOSIS — Z923 Personal history of irradiation: Secondary | ICD-10-CM | POA: Diagnosis not present

## 2021-02-23 DIAGNOSIS — Z8051 Family history of malignant neoplasm of kidney: Secondary | ICD-10-CM | POA: Diagnosis not present

## 2021-02-23 DIAGNOSIS — Z8041 Family history of malignant neoplasm of ovary: Secondary | ICD-10-CM | POA: Diagnosis not present

## 2021-02-23 DIAGNOSIS — J811 Chronic pulmonary edema: Secondary | ICD-10-CM | POA: Diagnosis not present

## 2021-02-23 DIAGNOSIS — Z853 Personal history of malignant neoplasm of breast: Secondary | ICD-10-CM | POA: Diagnosis not present

## 2021-02-23 DIAGNOSIS — F17210 Nicotine dependence, cigarettes, uncomplicated: Secondary | ICD-10-CM | POA: Diagnosis present

## 2021-02-23 DIAGNOSIS — Z9049 Acquired absence of other specified parts of digestive tract: Secondary | ICD-10-CM | POA: Diagnosis not present

## 2021-02-23 DIAGNOSIS — F17200 Nicotine dependence, unspecified, uncomplicated: Secondary | ICD-10-CM | POA: Diagnosis not present

## 2021-02-23 DIAGNOSIS — Z28310 Unvaccinated for covid-19: Secondary | ICD-10-CM | POA: Diagnosis not present

## 2021-02-23 DIAGNOSIS — Z9889 Other specified postprocedural states: Secondary | ICD-10-CM | POA: Diagnosis not present

## 2021-02-23 DIAGNOSIS — R7303 Prediabetes: Secondary | ICD-10-CM | POA: Diagnosis not present

## 2021-02-23 DIAGNOSIS — R0902 Hypoxemia: Secondary | ICD-10-CM | POA: Diagnosis not present

## 2021-02-23 DIAGNOSIS — Z20822 Contact with and (suspected) exposure to covid-19: Secondary | ICD-10-CM | POA: Diagnosis not present

## 2021-02-23 DIAGNOSIS — Z806 Family history of leukemia: Secondary | ICD-10-CM | POA: Diagnosis not present

## 2021-02-23 DIAGNOSIS — I10 Essential (primary) hypertension: Secondary | ICD-10-CM | POA: Diagnosis not present

## 2021-02-23 DIAGNOSIS — Z79899 Other long term (current) drug therapy: Secondary | ICD-10-CM | POA: Diagnosis not present

## 2021-02-23 DIAGNOSIS — J449 Chronic obstructive pulmonary disease, unspecified: Secondary | ICD-10-CM | POA: Diagnosis not present

## 2021-02-23 DIAGNOSIS — Z801 Family history of malignant neoplasm of trachea, bronchus and lung: Secondary | ICD-10-CM | POA: Diagnosis not present

## 2021-02-23 DIAGNOSIS — M7989 Other specified soft tissue disorders: Secondary | ICD-10-CM | POA: Diagnosis not present

## 2021-02-23 DIAGNOSIS — M79604 Pain in right leg: Secondary | ICD-10-CM | POA: Diagnosis not present

## 2021-02-23 DIAGNOSIS — Z9221 Personal history of antineoplastic chemotherapy: Secondary | ICD-10-CM | POA: Diagnosis not present

## 2021-02-23 DIAGNOSIS — Z6837 Body mass index (BMI) 37.0-37.9, adult: Secondary | ICD-10-CM | POA: Diagnosis not present

## 2021-02-23 DIAGNOSIS — J9601 Acute respiratory failure with hypoxia: Secondary | ICD-10-CM

## 2021-02-23 DIAGNOSIS — F410 Panic disorder [episodic paroxysmal anxiety] without agoraphobia: Secondary | ICD-10-CM | POA: Diagnosis not present

## 2021-02-23 DIAGNOSIS — K219 Gastro-esophageal reflux disease without esophagitis: Secondary | ICD-10-CM | POA: Diagnosis not present

## 2021-02-23 DIAGNOSIS — M1611 Unilateral primary osteoarthritis, right hip: Secondary | ICD-10-CM | POA: Diagnosis not present

## 2021-02-23 LAB — CBC
HCT: 38.8 % (ref 36.0–46.0)
Hemoglobin: 13.2 g/dL (ref 12.0–15.0)
MCH: 32 pg (ref 26.0–34.0)
MCHC: 34 g/dL (ref 30.0–36.0)
MCV: 94.2 fL (ref 80.0–100.0)
Platelets: 203 10*3/uL (ref 150–400)
RBC: 4.12 MIL/uL (ref 3.87–5.11)
RDW: 12.3 % (ref 11.5–15.5)
WBC: 11 10*3/uL — ABNORMAL HIGH (ref 4.0–10.5)
nRBC: 0 % (ref 0.0–0.2)

## 2021-02-23 LAB — BASIC METABOLIC PANEL
Anion gap: 5 (ref 5–15)
BUN: 10 mg/dL (ref 8–23)
CO2: 30 mmol/L (ref 22–32)
Calcium: 8.8 mg/dL — ABNORMAL LOW (ref 8.9–10.3)
Chloride: 102 mmol/L (ref 98–111)
Creatinine, Ser: 0.77 mg/dL (ref 0.44–1.00)
GFR, Estimated: >60 mL/min (ref >60)
Glucose, Bld: 115 mg/dL — ABNORMAL HIGH (ref 70–99)
Potassium: 3.6 mmol/L (ref 3.5–5.1)
Sodium: 137 mmol/L (ref 135–145)

## 2021-02-23 IMAGING — DX DG CHEST 2V
2 series · 2 of 2 positions shown · non-contrast
Comparison: [DATE]

CLINICAL DATA: Cough for several days.  Asthma.

EXAM:
CHEST - 2 VIEW

[x chest ap]
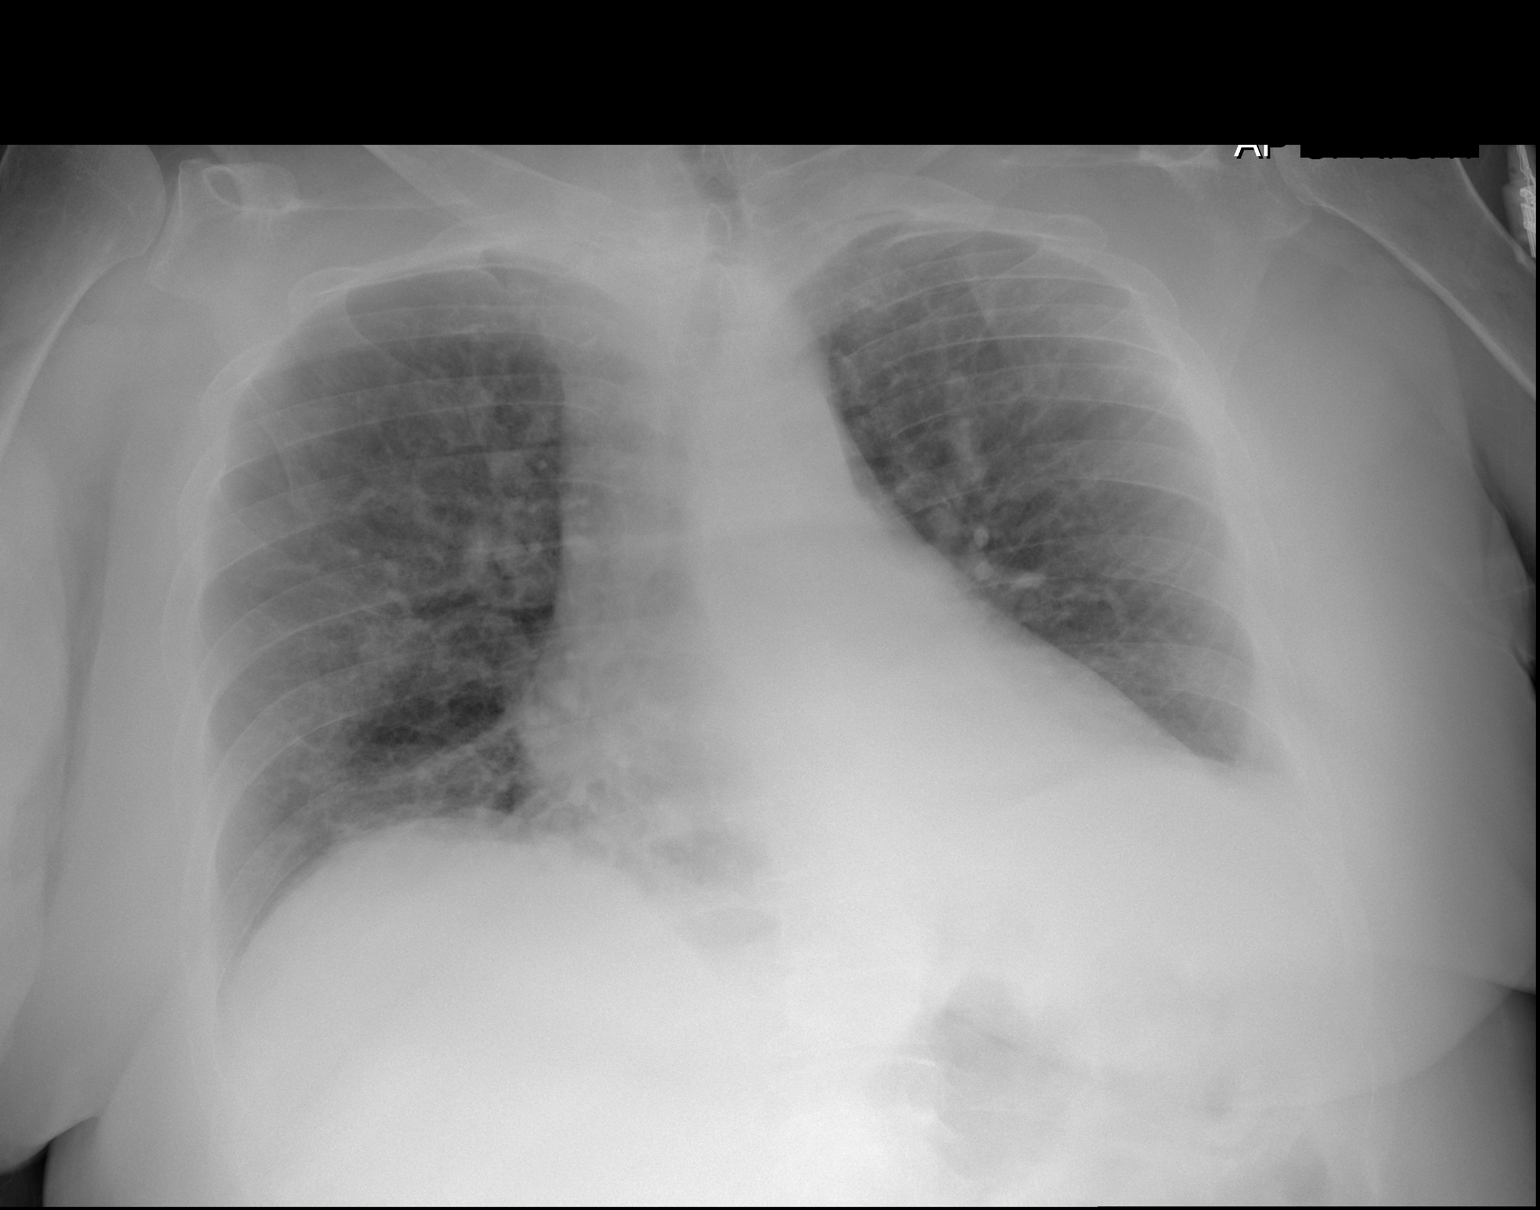

[w chest lat]
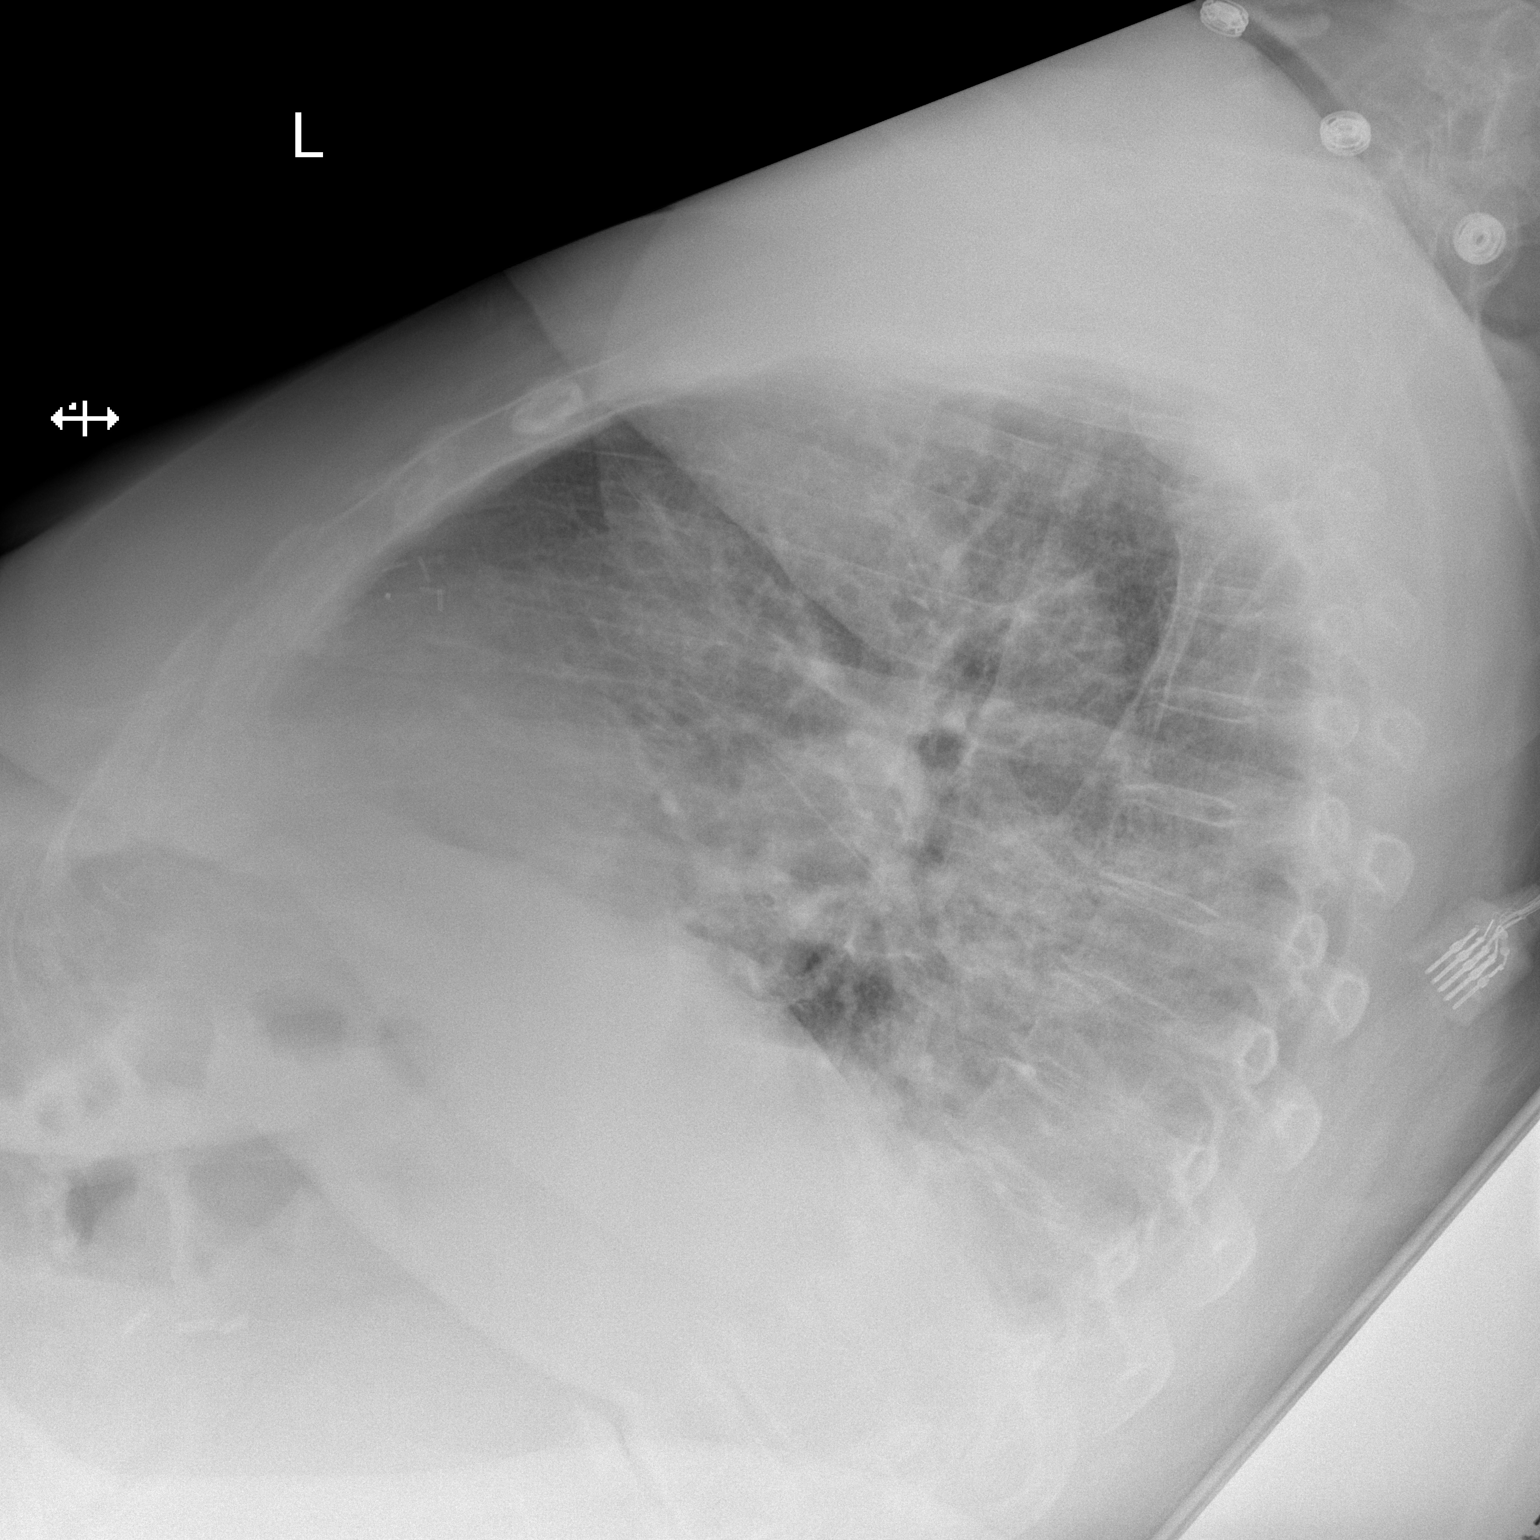

[2 of 2 positions shown; findings below may reference images not displayed]

FINDINGS: Heart size remains within normal limits. Low lung volumes are again
seen. Pulmonary interstitial prominence is stable. New opacity is
seen in the left retrocardiac lung base, which may be due to
atelectasis or infiltrate. No evidence of pleural effusion.
IMPRESSION: Left retrocardiac atelectasis versus infiltrate.

## 2021-02-23 MED ORDER — FUROSEMIDE 10 MG/ML IJ SOLN
40.0000 mg | Freq: Once | INTRAMUSCULAR | Status: AC
  Administered 2021-02-23: 40 mg via INTRAVENOUS
  Filled 2021-02-23: qty 4

## 2021-02-23 MED ORDER — HYDROCODONE-ACETAMINOPHEN 5-325 MG PO TABS
1.0000 | ORAL_TABLET | ORAL | Status: DC | PRN
  Administered 2021-02-23 – 2021-02-24 (×2): 1 via ORAL
  Administered 2021-02-24 – 2021-02-26 (×8): 2 via ORAL
  Administered 2021-02-27: 1 via ORAL
  Filled 2021-02-23 (×4): qty 2
  Filled 2021-02-23: qty 1
  Filled 2021-02-23 (×4): qty 2
  Filled 2021-02-23: qty 1
  Filled 2021-02-23: qty 2

## 2021-02-23 NOTE — Telephone Encounter (Signed)
Please see below and advise.

## 2021-02-23 NOTE — Progress Notes (Signed)
Patient ID: Regina Mcmahon, female   DOB: 07/04/1951, 70 y.o.   MRN: [id number]  Reviewed chest xray.  This showed:  Narrative & Impression  CLINICAL DATA:  Cough for several days.  Asthma.   EXAM: CHEST - 2 VIEW   COMPARISON:  09/17/2014   FINDINGS: Heart size remains within normal limits. Low lung volumes are again seen. Pulmonary interstitial prominence is stable. New opacity is seen in the left retrocardiac lung base, which may be due to atelectasis or infiltrate. No evidence of pleural effusion.   IMPRESSION: Left retrocardiac atelectasis versus infiltrate.     Electronically Signed   By: Marlaine Hind M.D.   On: 02/23/2021 16:32    Spoke with on call physician for pulmonologist Dr Melvyn Novas and they will do consult this evening.  Dr Lorin Mercy aware of findings.

## 2021-02-23 NOTE — Telephone Encounter (Signed)
noted 

## 2021-02-23 NOTE — Progress Notes (Signed)
Subjective: Patient c/o feeling drowsy.  Daughter present states that patient has been very drowsy since surgery with some intermittent confusion.  Has had productive cough. Currently clear sputum. Pulmonologist Dr wert put patient on a Zpack preop.  Per RN O2 sats high 80's RA.     Objective: Vital signs in last 24 hours: Temp:  [97.6 F (36.4 C)-98.9 F (37.2 C)] 98.9 F (37.2 C) (08/02 0751) Pulse Rate:  [48-106] 106 (08/02 0751) Resp:  [9-19] 18 (08/02 0751) BP: (105-129)/(48-83) 111/52 (08/02 0751) SpO2:  [90 %-100 %] 90 % (08/02 0751)  Intake/Output from previous day: 08/01 0701 - 08/02 0700 In: 2025.8 [I.V.:1325.8; IV Piggyback:700] Out: 1125 [Urine:225; Blood:900] Intake/Output this shift: Total I/O In: 1477.4 [I.V.:1477.4] Out: 700 [Urine:700]  Recent Labs    02/22/21 1139 02/23/21 0552  HGB 16.7* 13.2   Recent Labs    02/22/21 1139 02/23/21 0552  WBC  --  11.0*  RBC  --  4.12  HCT 49.0* 38.8  PLT  --  203   Recent Labs    02/22/21 1139 02/23/21 0552  NA 138 137  K 3.3* 3.6  CL 102 102  CO2  --  30  BUN 10 10  CREATININE 0.60 0.77  GLUCOSE 96 115*  CALCIUM  --  8.8*   No results for input(s): LABPT, INR in the last 72 hours.  Exam: Alert and oriented but patient easily doses off during my visit but arouses easy. Daughter present.  Hip dressing clean/dry/intact.      Assessment/Plan: -d/c dilaudid, and oxycodone due to increased drowsiness and intermittent confusion.  Will try norco 5/325.  -will get chest xray.  Hx of copd and productive cough.  Low O2 sat RA -encouraged incentive spirometry.   -must be on O2 2LNC.      Benjiman Core 02/23/2021, 1:56 PM

## 2021-02-23 NOTE — Progress Notes (Signed)
To: MD  Patient is upset that she is on a carb modified diet.  Patient is also requesting a nicotine patch while she is here.

## 2021-02-23 NOTE — Progress Notes (Signed)
Physical Therapy Evaluation Patient Details Name: Regina Mcmahon MRN: [id number] DOB: 12-Dec-1950 Today's Date: 02/23/2021   History of Present Illness  (P) Pt is a 70 y/o female s/p R THA, direct anterior approach. PMH including but not limited to HTN, COPD, current smoker.  Clinical Impression  Pt presented supine in bed with HOB elevated, awake and willing to participate in therapy session. Prior to admission, pt reported that she ambulated with use of a cane and was independent with ADLs. Pt lives with her 40 y/o grandson in a single level home with several steps to enter. At the time of evaluation, pt significantly limited with mobility secondary to pain and weakness. She required mod A for bed mobility and min A for transfers. She was unable to tolerate gait training this session and was in tears throughout despite having medication prior to session. Based on pt's currently functional mobility status and lack of solid support at home, PT currently recommending pt d/c to a SNF for short-term rehab to maximize her safety and independence with functional mobility prior to returning home. Will continue to follow-up with her acutely to progress mobility as tolerated.  Of note, pt on supplemental O2 (2L via [place]) throughout with SpO2 decreasing to mid 70's with activity. Pt's RN was notified.      Follow Up Recommendations SNF;Supervision/Assistance - 24 hour;Other (comment) (pending further progress)    Equipment Recommendations  None recommended by PT    Recommendations for Other Services       Precautions / Restrictions Precautions Precautions: Fall Precaution Comments: monitor SpO2 Restrictions Weight Bearing Restrictions: Yes RLE Weight Bearing: Weight bearing as tolerated      Mobility  Bed Mobility Overal bed mobility: Needs Assistance Bed Mobility: Supine to Sit     Supine to sit: Mod assist     General bed mobility comments: increased time and effort needed, assistance  needed for movement of her R LE off of bed and for trunk elevation    Transfers Overall transfer level: Needs assistance Equipment used: Rolling walker (2 wheeled) Transfers: Sit to/from [facility] Sit to Stand: Min assist Stand pivot transfers: Min assist       General transfer comment: greatly increased time and effort needed, pt very anxious and tearful throughout; maximal cueing for technique and sequencing, min A needed for stability throughout  Ambulation/Gait             General Gait Details: pt unable to tolerate on eval  Stairs            Wheelchair Mobility    Modified Rankin (Stroke Patients Only)       Balance Overall balance assessment: Needs assistance Sitting-balance support: Feet supported Sitting balance-Leahy Scale: Fair     Standing balance support: During functional activity;Bilateral upper extremity supported Standing balance-Leahy Scale: Poor                               Pertinent Vitals/Pain Pain Assessment: Faces Faces Pain Scale: Hurts worst Pain Location: R hip with movement Pain Descriptors / Indicators: Crying;Grimacing Pain Intervention(s): Monitored during session;Repositioned    Home Living Family/patient expects to be discharged to:: Private residence Living Arrangements: Other relatives;Other (Comment) (61 y/o grandson) Available Help at Discharge: Family;Available PRN/intermittently Type of Home: House Home Access: Stairs to enter Entrance Stairs-Rails: Psychiatric nurse of Steps: 5 Home Layout: One level Home Equipment: Walker - 2 wheels;Cane - single point  Prior Function Level of Independence: Independent with assistive device(s)         Comments: was ambulating with use of a cane     Hand Dominance        Extremity/Trunk Assessment   Upper Extremity Assessment Upper Extremity Assessment: Generalized weakness    Lower Extremity Assessment Lower  Extremity Assessment: RLE deficits/detail RLE Deficits / Details: pt with decreased strength and AROM limitations secondary to post-op pain and weakness       Communication   Communication: No difficulties  Cognition Arousal/Alertness: Awake/alert Behavior During Therapy: Anxious Overall Cognitive Status: Within Functional Limits for tasks assessed                                        General Comments      Exercises     Assessment/Plan    PT Assessment Patient needs continued PT services  PT Problem List Decreased strength;Decreased range of motion;Decreased activity tolerance;Decreased balance;Decreased mobility;Decreased knowledge of use of DME;Decreased coordination;Decreased safety awareness;Decreased knowledge of precautions;Pain       PT Treatment Interventions DME instruction;Gait training;Stair training;Therapeutic activities;Functional mobility training;Therapeutic exercise;Balance training;Patient/family education;Neuromuscular re-education    PT Goals (Current goals can be found in the Care Plan section)  Acute Rehab PT Goals Patient Stated Goal: decrease pain PT Goal Formulation: With patient Time For Goal Achievement: 03/09/21 Potential to Achieve Goals: Good    Frequency 7X/week   Barriers to discharge        Co-evaluation               AM-PAC PT "6 Clicks" Mobility  Outcome Measure Help needed turning from your back to your side while in a flat bed without using bedrails?: A Little Help needed moving from lying on your back to sitting on the side of a flat bed without using bedrails?: A Little Help needed moving to and from a bed to a chair (including a wheelchair)?: A Little Help needed standing up from a chair using your arms (e.g., wheelchair or bedside chair)?: A Little Help needed to walk in hospital room?: A Lot Help needed climbing 3-5 steps with a railing? : Total 6 Click Score: 15    End of Session Equipment  Utilized During Treatment: Gait belt;Oxygen Activity Tolerance: Patient limited by pain Patient left: in chair;with call bell/phone within reach;with chair alarm set Nurse Communication: Mobility status PT Visit Diagnosis: Other abnormalities of gait and mobility (R26.89);Pain Pain - Right/Left: Right Pain - part of body: Hip    Time: 2841-3244 PT Time Calculation (min) (ACUTE ONLY): 19 min   Charges:   PT Evaluation $PT Eval Moderate Complexity: 1 Mod          Eduard Clos, PT, DPT  Acute Rehabilitation Services Office [place] 02/23/2021, 11:17 AM

## 2021-02-23 NOTE — Plan of Care (Signed)

## 2021-02-23 NOTE — Progress Notes (Signed)
Physical Therapy Treatment Patient Details Name: Regina Mcmahon MRN: [id number] DOB: 1951/06/07 Today's Date: 02/23/2021    History of Present Illness (P) Pt is a 70 y/o female s/p R THA, direct anterior approach. PMH including but not limited to HTN, COPD, current smoker.    PT Comments    Pt sitting up in recliner chair upon PT arrival. She reported that she had assistance with use the [facility] and with a wash up earlier from nursing staff. Her daughter was present throughout session as well. Pt slightly improved with functional mobility since session this morning; however, she remains very limited overall secondary to pain and weakness. PT had a discussion with the pt and pt's daughter regarding d/c recommendations, and that if pt wanted to go home she would need to be able to ascend/descend stairs and ambulate household distances. They both expressed understanding. Hopeful to progress to gait training tomorrow. Pt would continue to benefit from skilled physical therapy services at this time while admitted and after d/c to address the below listed limitations in order to improve overall safety and independence with functional mobility.   Follow Up Recommendations  (P) SNF;Supervision/Assistance - 24 hour;Other (comment) (pending further progress; pt is adamant about returning home)     Equipment Recommendations  (P) 3in1 (PT);Rolling walker with 5" wheels    Recommendations for Other Services       Precautions / Restrictions Precautions Precautions: (P) Fall Precaution Comments: (P) monitor SpO2 Restrictions Weight Bearing Restrictions: (P) Yes RLE Weight Bearing: (P) Weight bearing as tolerated    Mobility  Bed Mobility Overal bed mobility: (P) Needs Assistance Bed Mobility: (P) Sit to Supine       Sit to supine: (P) Mod assist   General bed mobility comments: (P) assistance needed to return bilateral LEs onto bed    Transfers Overall transfer level: (P) Needs  assistance Equipment used: (P) Rolling walker (2 wheeled) Transfers: (P) Sit to/from Stand;Stand Pivot Transfers Sit to Stand: (P) Min guard Stand pivot transfers: (P) Min guard       General transfer comment: (P) cueing needed for safe hand placement and technique with use of RW; min guard for safety with transitional movement  Ambulation/Gait             General Gait Details: (P) unable to tolerate secondary to pain   Stairs             Wheelchair Mobility    Modified Rankin (Stroke Patients Only)       Balance Overall balance assessment: (P) Needs assistance Sitting-balance support: (P) Feet supported Sitting balance-Leahy Scale: (P) Fair     Standing balance support: (P) During functional activity;Bilateral upper extremity supported Standing balance-Leahy Scale: (P) Poor                              Cognition Arousal/Alertness: (P) Lethargic;Suspect due to medications Behavior During Therapy: (P) Anxious Overall Cognitive Status: (P) Within Functional Limits for tasks assessed                                        Exercises      General Comments        Pertinent Vitals/Pain Pain Assessment: (P) Faces Faces Pain Scale: (P) Hurts even more Pain Location: (P) R hip with movement Pain Descriptors / Indicators: (P) Grimacing;Guarding Pain  Intervention(s): (P) Monitored during session;Repositioned    Home Living                      Prior Function            PT Goals (current goals can now be found in the care plan section) Acute Rehab PT Goals PT Goal Formulation: (P) With patient Time For Goal Achievement: (P) 03/09/21 Potential to Achieve Goals: (P) Good Progress towards PT goals: (P) Progressing toward goals    Frequency    (P) 7X/week      PT Plan (P) Current plan remains appropriate    Co-evaluation              AM-PAC PT "6 Clicks" Mobility   Outcome Measure  Help needed  turning from your back to your side while in a flat bed without using bedrails?: (P) A Little Help needed moving from lying on your back to sitting on the side of a flat bed without using bedrails?: (P) A Little Help needed moving to and from a bed to a chair (including a wheelchair)?: (P) A Little Help needed standing up from a chair using your arms (e.g., wheelchair or bedside chair)?: (P) A Little Help needed to walk in hospital room?: (P) A Lot Help needed climbing 3-5 steps with a railing? : (P) Total 6 Click Score: (P) 15    End of Session Equipment Utilized During Treatment: (P) Gait belt Activity Tolerance: (P) Patient limited by pain Patient left: (P) in bed;with call bell/phone within reach;with bed alarm set;with family/visitor present Nurse Communication: (P) Mobility status PT Visit Diagnosis: (P) Other abnormalities of gait and mobility (R26.89);Pain Pain - Right/Left: (P) Right Pain - part of body: (P) Hip     Time: (P) 1219-(P) 1245 PT Time Calculation (min) (ACUTE ONLY): (P) 26 min  Charges:  $Therapeutic Activity: (P) 23-37 mins                     Anastasio Champion, DPT  Acute Rehabilitation Services Office [place] 02/23/2021, 1:12 PM

## 2021-02-23 NOTE — TOC Initial Note (Signed)
Transition of Care [facility]) - Initial/Assessment Note    Patient Details  Name: Regina Mcmahon MRN: [id number] Date of Birth: 10-17-1950  Transition of Care [facility]) CM/SW Contact:    Milinda Antis, [place] Phone Number: 02/23/2021, 2:17 PM  Clinical Narrative:                  CSW received consult for possible SNF placement at the time of discharge and attempted to speak with the patient.  The patient attempted to speak with CSW but was confused and drowsy.  The patient then stated that CSW should speak with her daughter. The patient's daughter, Regina Mcmahon, was in the room and spoke with CSW.  The patient's daughter reported that the patient wanted to go home rather than SNF, but the family wants to do "what is best" for the patient even if that includes rehab.    The patient's daughter reports that the patient's 38 y/o grandson will be staying with the patient if the patient returns home.  The grandson would be able to assist with transfers and taking the patient to the bathroom, but will be unable to bathe and provide physical therapy.  CSW inquired as to whether anyone else will be coming to the home daily.  The patient's daughter reports that she or the patient's sister would be going to the home each day to check on patient if patient returns home.  The daughter stated the physical therapist informed the patient that PT will work with the patient tomorrow to see how she is progressing.  The daughter is concerned about the patient's pain and if the patient will be able to tolerate the pain when attempting to mobilize if patient returns home.   The daughter informed CSW that the family would like to wait until tomorrow to make a decision about SNF/HH once PT has re- evaluated the patient.   CSW spoke with patient's daughter about HH/SNF and the importance of the patient agreeing to go to SNF (if that is the family's wish and PT continues to recommend SNF) because the patient is oriented x 4 and  capable of making decisions.       Patient Goals and CMS Choice        Expected Discharge Plan and Services                                                Prior Living Arrangements/Services                       Activities of Daily Living Home Assistive Devices/Equipment: Environmental consultant (specify type), Cane (specify quad or straight), Shower chair with back, Eyeglasses, Dentures (specify type) ADL Screening (condition at time of admission) Patient's cognitive ability adequate to safely complete daily activities?: Yes Is the patient deaf or have difficulty hearing?: No Does the patient have difficulty seeing, even when wearing glasses/contacts?: No Does the patient have difficulty concentrating, remembering, or making decisions?: No Patient able to express need for assistance with ADLs?: Yes Does the patient have difficulty dressing or bathing?: No Independently performs ADLs?: Yes (appropriate for developmental age) Does the patient have difficulty walking or climbing stairs?: Yes Weakness of Legs: Right Weakness of Arms/Hands: None  Permission Sought/Granted  Emotional Assessment              Admission diagnosis:  Arthritis of right hip [M16.11] Patient Active Problem List   Diagnosis Date Noted   Arthritis of right hip 02/22/2021   Unilateral primary osteoarthritis, right hip 01/21/2021   COPD GOLD II  /  still smoking  10/12/2020   Shortness of breath 09/12/2016   Well woman exam with routine gynecological exam 04/20/2016   Morbid obesity due to excess calories ([place]) 03/03/2016   Colon adenomas 03/03/2016   Rash 01/18/2016   GERD (gastroesophageal reflux disease) 10/19/2015   Osteopenia determined by x-ray 09/07/2015   High risk medication use 09/07/2015   Genetic testing 03/20/2015   Family history of ovarian cancer 03/20/2015   Family history of colon cancer 03/20/2015   Family history of cancer 03/20/2015   Carcinoma  of upper-outer quadrant of left female breast ([place]) 09/03/2014   Anxiety 10/29/2012   Blood in stool 10/29/2012   Pain 10/29/2012   Chronic airway obstruction ([place]) 10/29/2012   Diarrhea 10/29/2012   Cigarette smoker 10/29/2012   Essential hypertension 10/29/2012   PCP:  [name], MD Pharmacy:   [place], [place] - [address] [place] [address] Phone: [phone] Fax: [phone]     Social Determinants of Health (SDOH) Interventions    Readmission Risk Interventions No flowsheet data found.

## 2021-02-23 NOTE — Consult Note (Addendum)
NAME:  Regina Mcmahon, MRN:  [id number], DOB:  [birth year], LOS: 0 ADMISSION DATE:  02/22/2021, CONSULTATION DATE:  02/23/21 REFERRING MD:  Dutch Gray, CHIEF COMPLAINT:  hypoxia  History of Present Illness:  [name] is a 70 y/o woman with a history of COPD with ongoing tobacco abuse who was admitted for R THA. Pulmonology was consulted for hypoxia and inability to wean off oxygen. She denies fever, chills, SOB, cough, sputum production. She has some pain with deep breathing, mostly in her throat and central chest with a  burning sensation in her airway she attributes to being on oxygen. She continues to smoke 2ppd and has some baseline DOE but no significant wheezing. She coughs in the morning, but does not cough throughout the day at baseline and does not have sputum production at baseline. She is prescribed Anoro PTA, but uses it only as needed, usually 1-2 times per week when her symptoms are worse than baseline. Today she requires oxygen with desaturation to the upper 80s when off oxygen. She has been having pain that is difficult to control, and her primary team is trying to wean her off higher dose opiates. She has been out of bed part of the day.   SpO2 100% at recent office visit 01/11/21  FVC 2.63 (84%) FEV1 1.59 (67%) Ratio 61>75% TLC 129% RV 178% DLCO 42% No significant BD reversibility  Pertinent  Medical History  Breast cancer COPD GERD Tobacco abuse HTN   Significant Hospital Events: Including procedures, antibiotic start and stop dates in addition to other pertinent events   8/1 THA  Interim History / Subjective:    Objective   Blood pressure (!) 104/56, pulse 81, temperature 98.6 F (37 C), temperature source Oral, resp. rate 18, height 5' 3.5" (1.613 m), weight 98.4 kg, SpO2 93 %.        Intake/Output Summary (Last 24 hours) at 02/23/2021 1700 Last data filed at 02/23/2021 1045 Gross per 24 hour  Intake 1503.24 ml  Output 700 ml  Net 803.24 ml   Filed Weights    02/22/21 1051  Weight: 98.4 kg    Examination: General: chronically ill appearing woman lying in bed in NAD HENT: [place]/AT, eyes anicteric, forehead oxygen probe Lungs: Saturations improved to upper 90s after using IS, able to get to ~750cc with IS. No wheezing or rhales. Cardiovascular: S1S2, RRR Abdomen: obese, soft, NT Extremities: surgical wound not examined, no LE edema Neuro: awake, alert, moving all extremities on command Derm: warm, dry, no rashes  CXR personally reviewed> pulmonary edema, possible retrocardiac opacity. Compared to 2016 CXR, pulm edema present, port-a-cath since removed.  WBC 11 H/H 13.2/38.8  Resolved Hospital Problem list     Assessment & Plan:  Acute respiratory failure with hypoxia-- presentation and CXR consistent with atelectasis rather than pneumonia. Possibly pulmonary edema, but difficult to assess fully on portable CXR.  COPD-- not acutely exacerbated -Recommend lasix 40mg  IV x 1 dose overnight -Reinforced the importance of OOB mobility -IS frequently -Agree with cautious use of pain medications; need to make sure pain is tolerable but it will not be fully resolved. Discussed with her and her daughter. Need to ensure adequate ventilation. -Con't daily Anoro  Best Practice (right click and "Reselect all SmartList Selections" daily)   Per primary  Labs   CBC: Recent Labs  Lab 02/18/21 1530 02/22/21 1139 02/23/21 0552  WBC 9.2  --  11.0*  HGB 17.4* 16.7* 13.2  HCT 50.2* 49.0* 38.8  MCV 92.6  --  94.2  PLT 243  --  932    Basic Metabolic Panel: Recent Labs  Lab 02/18/21 1530 02/22/21 1139 02/23/21 0552  NA 139 138 137  K 2.8* 3.3* 3.6  CL 99 102 102  CO2 31  --  30  GLUCOSE 101* 96 115*  BUN 11 10 10   CREATININE 0.79 0.60 0.77  CALCIUM 9.3  --  8.8*   GFR: Estimated Creatinine Clearance: 77 mL/min (by C-G formula based on SCr of 0.77 mg/dL). Recent Labs  Lab 02/18/21 1530 02/23/21 0552  WBC 9.2 11.0*    Liver  Function Tests: Recent Labs  Lab 02/18/21 1530  AST 16  ALT 16  ALKPHOS 68  BILITOT 1.2  PROT 6.4*  ALBUMIN 3.4*   No results for input(s): LIPASE, AMYLASE in the last 168 hours. No results for input(s): AMMONIA in the last 168 hours.  ABG    Component Value Date/Time   TCO2 27 02/22/2021 1139     Coagulation Profile: No results for input(s): INR, PROTIME in the last 168 hours.  Cardiac Enzymes: No results for input(s): CKTOTAL, CKMB, CKMBINDEX, TROPONINI in the last 168 hours.  HbA1C: Hgb A1c MFr Bld  Date/Time Value Ref Range Status  02/18/2021 03:30 PM 5.6 4.8 - 5.6 % Final    Comment:    (NOTE) Pre diabetes:          5.7%-6.4%  Diabetes:              >6.4%  Glycemic control for   <7.0% adults with diabetes     CBG: Recent Labs  Lab 02/22/21 1056  GLUCAP 103*    Review of Systems:   Review of Systems  Constitutional:  Negative for chills, fever and weight loss.  HENT: Negative.    Respiratory:  Negative for cough, sputum production, shortness of breath and wheezing.   Cardiovascular:  Negative for chest pain and leg swelling.  Gastrointestinal:  Negative for heartburn, nausea and vomiting.  Genitourinary: Negative.   Musculoskeletal:  Negative for joint pain and myalgias.  Skin:  Negative for rash.  Neurological:  Negative for dizziness and focal weakness.  Endo/Heme/Allergies:  Negative for environmental allergies.     Past Medical History:  She,  has a past medical history of Asthma, Asymptomatic varicose veins, Breast cancer ([place]) (06/2014), Chronic airway obstruction ([place]), Complication of anesthesia, COPD (chronic obstructive pulmonary disease) ([place]), Depression, GERD (gastroesophageal reflux disease), Hemorrhage rectum, Hypertension, Insomnia, Other symptoms involving digestive system(787.99), Pain, Palpitations, Panic attacks, Personal history of chemotherapy (2016), Personal history of radiation therapy (2016), Pre-diabetes, Sinusitis, and  Symptomatic menopausal or female climacteric states.   Surgical History:   Past Surgical History:  Procedure Laterality Date   BREAST BIOPSY Left 10/2012   BREAST BIOPSY Left 07/01/2014   malignant   BREAST LUMPECTOMY Left 08/20/2014   CESAREAN SECTION     CHOLECYSTECTOMY  1985   COLONOSCOPY  2002   Dr. Lindalou Hose: internal hemorrhoids, one small rectal polyp, adenomatous path    COLONOSCOPY N/A 11/23/2015   Procedure: COLONOSCOPY;  Surgeon: Danie Binder, MD;  [facility];  Service: Endoscopy;  Laterality: N/A;  1100 - moved to 10:45 - office to notify   ESOPHAGOGASTRODUODENOSCOPY N/A 11/23/2015   Procedure: ESOPHAGOGASTRODUODENOSCOPY (EGD);  Surgeon: Danie Binder, MD;  [facility];  Service: Endoscopy;  Laterality: N/A;   PORT-A-CATH REMOVAL  02/2015   PORTACATH PLACEMENT Right 09/17/2014   Procedure: INSERTION PORT-A-CATH WITH ULTRASOUND;  Surgeon: Erroll Luna, MD;  Location: [facility];  Service: General;  Laterality: Right;  IJ   RADIOACTIVE SEED GUIDED PARTIAL MASTECTOMY WITH AXILLARY SENTINEL LYMPH NODE BIOPSY Left 08/20/2014   Procedure: LEFT BREAST LUMPECTOMY WITH RADIOACTIVE SEED LOCALIZATION AND SENTINEL LYMPH NODE MAPPING;  Surgeon: Erroll Luna, MD;  Location: [facility];  Service: General;  Laterality: Left;   RE-EXCISION OF BREAST LUMPECTOMY Left 09/17/2014   Procedure: RE-EXCISION OF BREAST LUMPECTOMY;  Surgeon: Erroll Luna, MD;  Location: [facility];  Service: General;  Laterality: Left;   TOTAL HIP ARTHROPLASTY Right 02/22/2021   Procedure: RIGHT TOTAL HIP ARTHROPLASTY ANTERIOR APPROACH;  Surgeon: Marybelle Killings, MD;  Location: [facility];  Service: Orthopedics;  Laterality: Right;   TUBAL LIGATION       Social History:   reports that she has been smoking cigarettes. She has a 42.00 pack-year smoking history. She has never used smokeless tobacco. She reports that she does not drink alcohol and does not use  drugs.   Family History:  Her family history includes Alzheimer's disease in her maternal grandmother; COPD in her paternal grandfather; Cancer in her cousin, cousin, and paternal aunt; Colon cancer in her brother and cousin; Congestive Heart Failure in her paternal aunt; Diabetes in her mother; Emphysema in her paternal grandfather; Heart attack in her maternal grandfather and paternal grandmother; Kidney cancer in her maternal uncle; Melanoma (age of onset: 50) in her sister; Other in her father and sister; Ovarian cancer (age of onset: 83) in her mother; Stomach cancer in her paternal uncle.   Allergies No Known Allergies   Home Medications  Prior to Admission medications   Medication Sig Start Date End Date Taking? Authorizing Provider  albuterol (PROVENTIL HFA;VENTOLIN HFA) 108 (90 BASE) MCG/ACT inhaler Inhale 2 puffs into the lungs every 6 (six) hours as needed for wheezing or shortness of breath.    Yes [provider]  ALPRAZolam Duanne Moron) 0.5 MG tablet Take 0.5 mg by mouth 3 (three) times daily as needed for anxiety.    Yes [provider]  ANORO ELLIPTA 62.5-25 MCG/INH AEPB Inhale 1 puff into the lungs daily as needed (SOB). 10/17/19  Yes [provider]  calcium carbonate (TUMS - DOSED IN MG ELEMENTAL CALCIUM) 500 MG chewable tablet Chew 1 tablet by mouth 3 (three) times daily as needed for indigestion or heartburn. Reported on 12/07/2015   Yes [provider]  furosemide (LASIX) 20 MG tablet Take 20 mg by mouth daily.   Yes [provider]  gabapentin (NEURONTIN) 300 MG capsule Take 1 capsule (300 mg total) by mouth 3 (three) times daily. Patient taking differently: Take 300 mg by mouth at bedtime. 10/16/19  Yes Carole Civil, MD  hydrochlorothiazide (HYDRODIURIL) 12.5 MG tablet Take 12.5 mg by mouth daily. 12/25/20  Yes [provider]  pantoprazole (PROTONIX) 40 MG tablet Take 1 tablet by mouth once daily Patient taking  differently: Take 40 mg by mouth daily. 01/03/20  Yes Carlis Stable, NP  potassium citrate (UROCIT-K) 10 MEQ (1080 MG) SR tablet Take 10 mEq by mouth daily. 08/14/20  Yes [provider]  sertraline (ZOLOFT) 100 MG tablet Take 100 mg by mouth daily. 07/23/19  Yes [provider]  azithromycin (ZITHROMAX) 250 MG tablet 2 on day one then 1 daily Patient not taking: No sig reported 01/11/21   Tanda Rockers, MD  ibuprofen (ADVIL) 800 MG tablet Take 800 mg by mouth 3 (three) times daily.    [provider]      Julian Hy, DO 02/23/21 6:15 PM [facility]

## 2021-02-23 NOTE — Plan of Care (Signed)
  Problem: Health Behavior/Discharge Planning: Goal: Ability to manage health-related needs will improve 02/23/2021 2112 by Williams Che, RN Outcome: Progressing 02/23/2021 1035 by Williams Che, RN Outcome: Progressing   Problem: Clinical Measurements: Goal: Ability to maintain clinical measurements within normal limits will improve 02/23/2021 2112 by Williams Che, RN Outcome: Progressing 02/23/2021 1035 by Williams Che, RN Outcome: Progressing   Problem: Activity: Goal: Risk for activity intolerance will decrease Outcome: Progressing   Problem: Nutrition: Goal: Adequate nutrition will be maintained 02/23/2021 2112 by Williams Che, RN Outcome: Progressing 02/23/2021 1035 by Williams Che, RN Outcome: Progressing   Problem: Coping: Goal: Level of anxiety will decrease 02/23/2021 2112 by Williams Che, RN Outcome: Progressing 02/23/2021 1035 by Williams Che, RN Outcome: Progressing   Problem: Elimination: Goal: Will not experience complications related to bowel motility 02/23/2021 2112 by Williams Che, RN Outcome: Progressing 02/23/2021 1035 by Williams Che, RN Outcome: Progressing   Problem: Pain Managment: Goal: General experience of comfort will improve 02/23/2021 2112 by Williams Che, RN Outcome: Progressing 02/23/2021 1035 by Williams Che, RN Outcome: Progressing   Problem: Safety: Goal: Ability to remain free from injury will improve 02/23/2021 2112 by Williams Che, RN Outcome: Progressing 02/23/2021 1035 by Williams Che, RN Outcome: Progressing   Problem: Skin Integrity: Goal: Risk for impaired skin integrity will decrease 02/23/2021 2112 by Williams Che, RN Outcome: Progressing 02/23/2021 1035 by Williams Che, RN Outcome: Progressing

## 2021-02-23 NOTE — Telephone Encounter (Signed)
PT sister called and states she can't get in the hospital and she is pts caregiver. She wants to talk to yates asap. She is wanting to know details on this pt.    CB [phone]

## 2021-02-23 NOTE — Progress Notes (Signed)
Patient ID: Regina Mcmahon, female   DOB: 1951/01/05, 70 y.o.   MRN: [id number]   Subjective: 1 Day Post-Op Procedure(s) (LRB): RIGHT TOTAL HIP ARTHROPLASTY ANTERIOR APPROACH (Right) Patient reports pain as moderate and severe.  C/O severe pain, got Dilaudid , at one point she removed her gown last night. Hgb 16.7 stable. Incision soft .  O2 sat 90% on 2L.     Objective: Vital signs in last 24 hours: Temp:  [97.6 F (36.4 C)-98.9 F (37.2 C)] 98.9 F (37.2 C) (08/02 0751) Pulse Rate:  [48-106] 106 (08/02 0751) Resp:  [9-19] 18 (08/02 0751) BP: (105-158)/(48-83) 111/52 (08/02 0751) SpO2:  [90 %-100 %] 90 % (08/02 0751) Weight:  [98.4 kg] 98.4 kg (08/01 1051)  Intake/Output from previous day: 08/01 0701 - 08/02 0700 In: 2025.8 [I.V.:1325.8; IV Piggyback:700] Out: 1125 [Urine:225; Blood:900] Intake/Output this shift: Total I/O In: -  Out: 700 [Urine:700]  Recent Labs    02/22/21 1139 02/23/21 0552  HGB 16.7* 13.2   Recent Labs    02/22/21 1139 02/23/21 0552  WBC  --  11.0*  RBC  --  4.12  HCT 49.0* 38.8  PLT  --  203   Recent Labs    02/22/21 1139 02/23/21 0552  NA 138 137  K 3.3* 3.6  CL 102 102  CO2  --  30  BUN 10 10  CREATININE 0.60 0.77  GLUCOSE 96 115*  CALCIUM  --  8.8*   No results for input(s): LABPT, INR in the last 72 hours.  Neurologically intact DG C-Arm 1-60 Min  Result Date: 02/22/2021 CLINICAL DATA:  Surgery, elective Z41.9 (ICD-10-CM). Additional history provided by technologist: Right total hip arthroplasty anterior approach. Provided fluoroscopy time 32 seconds (5.885 mGy). EXAM: OPERATIVE right HIP (WITH PELVIS IF PERFORMED) 2 VIEWS TECHNIQUE: Fluoroscopic spot image(s) were submitted for interpretation post-operatively. COMPARISON:  Radiographs of the right hip 12/20/2018. FINDINGS: Two intraoperative fluoroscopic images of the right hip are submitted. On the provided images, there are findings of interval right total hip arthroplasty. The  femoral and acetabular components appear well seated. No unexpected finding. IMPRESSION: Two intraoperative fluoroscopic images of the right hip from right total hip arthroplasty. Electronically Signed   By: Kellie Simmering DO   On: 02/22/2021 15:05   DG Hip Port Unilat With Pelvis 1V Right  Result Date: 02/22/2021 CLINICAL DATA:  Status post right hip total arthroplasty EXAM: DG HIP (WITH OR WITHOUT PELVIS) 1V PORT RIGHT COMPARISON:  None. FINDINGS: Status post right hip total arthroplasty with expected overlying postoperative changes. No evidence of perihardware fracture or component malpositioning. IMPRESSION: Status post right hip total arthroplasty with expected overlying postoperative changes. No evidence of perihardware fracture or component malpositioning. Electronically Signed   By: Eddie Candle M.D.   On: 02/22/2021 16:23   DG HIP OPERATIVE UNILAT WITH PELVIS RIGHT  Result Date: 02/22/2021 CLINICAL DATA:  Surgery, elective Z41.9 (ICD-10-CM). Additional history provided by technologist: Right total hip arthroplasty anterior approach. Provided fluoroscopy time 32 seconds (5.885 mGy). EXAM: OPERATIVE right HIP (WITH PELVIS IF PERFORMED) 2 VIEWS TECHNIQUE: Fluoroscopic spot image(s) were submitted for interpretation post-operatively. COMPARISON:  Radiographs of the right hip 12/20/2018. FINDINGS: Two intraoperative fluoroscopic images of the right hip are submitted. On the provided images, there are findings of interval right total hip arthroplasty. The femoral and acetabular components appear well seated. No unexpected finding. IMPRESSION: Two intraoperative fluoroscopic images of the right hip from right total hip arthroplasty. Electronically Signed   By:  Kellie Simmering DO   On: 02/22/2021 15:05    Assessment/Plan: 1 Day Post-Op Procedure(s) (LRB): RIGHT TOTAL HIP ARTHROPLASTY ANTERIOR APPROACH (Right) Plan:   up with therapy. Has not been up yet.   Marybelle Killings 02/23/2021, 8:13 AM

## 2021-02-24 LAB — CBC
HCT: 35.8 % — ABNORMAL LOW (ref 36.0–46.0)
Hemoglobin: 12.4 g/dL (ref 12.0–15.0)
MCH: 32.1 pg (ref 26.0–34.0)
MCHC: 34.6 g/dL (ref 30.0–36.0)
MCV: 92.7 fL (ref 80.0–100.0)
Platelets: 174 10*3/uL (ref 150–400)
RBC: 3.86 MIL/uL — ABNORMAL LOW (ref 3.87–5.11)
RDW: 12.4 % (ref 11.5–15.5)
WBC: 12.9 10*3/uL — ABNORMAL HIGH (ref 4.0–10.5)
nRBC: 0 % (ref 0.0–0.2)

## 2021-02-24 MED ORDER — CHLORHEXIDINE GLUCONATE CLOTH 2 % EX PADS
6.0000 | MEDICATED_PAD | Freq: Every day | CUTANEOUS | Status: DC
  Administered 2021-02-24 – 2021-02-27 (×4): 6 via TOPICAL

## 2021-02-24 NOTE — Progress Notes (Signed)
Pt declining going to SNF. She said she can live with daughter for a while until independent to live on her own. Pt said she will have 24 hour supervision at daughters house and family who can help her.

## 2021-02-24 NOTE — Progress Notes (Signed)
   Subjective: 2 Days Post-Op Procedure(s) (LRB): RIGHT TOTAL HIP ARTHROPLASTY ANTERIOR APPROACH (Right) Patient reports pain as moderate and severe.    Objective: Vital signs in last 24 hours: Temp:  [98.6 F (37 C)-99.3 F (37.4 C)] 99.3 F (37.4 C) (08/02 2026) Pulse Rate:  [81-106] 82 (08/02 2026) Resp:  [17-18] 17 (08/02 2026) BP: (90-111)/(47-69) 101/58 (08/02 2026) SpO2:  [88 %-93 %] 91 % (08/02 2026)  Intake/Output from previous day: 08/02 0701 - 08/03 0700 In: 2182.8 [P.O.:480; I.V.:1702.8] Out: 700 [Urine:700] Intake/Output this shift: No intake/output data recorded.  Recent Labs    02/22/21 1139 02/23/21 0552 02/24/21 0203  HGB 16.7* 13.2 12.4   Recent Labs    02/23/21 0552 02/24/21 0203  WBC 11.0* 12.9*  RBC 4.12 3.86*  HCT 38.8 35.8*  PLT 203 174   Recent Labs    02/22/21 1139 02/23/21 0552  NA 138 137  K 3.3* 3.6  CL 102 102  CO2  --  30  BUN 10 10  CREATININE 0.60 0.77  GLUCOSE 96 115*  CALCIUM  --  8.8*   No results for input(s): LABPT, INR in the last 72 hours.  Neurologically intact DG Chest 2 View  Result Date: 02/23/2021 CLINICAL DATA:  Cough for several days.  Asthma. EXAM: CHEST - 2 VIEW COMPARISON:  09/17/2014 FINDINGS: Heart size remains within normal limits. Low lung volumes are again seen. Pulmonary interstitial prominence is stable. New opacity is seen in the left retrocardiac lung base, which may be due to atelectasis or infiltrate. No evidence of pleural effusion. IMPRESSION: Left retrocardiac atelectasis versus infiltrate. Electronically Signed   By: Marlaine Hind M.D.   On: 02/23/2021 16:32    Assessment/Plan: 2 Days Post-Op Procedure(s) (LRB): RIGHT TOTAL HIP ARTHROPLASTY ANTERIOR APPROACH (Right) Up with therapy,slow progress with mobility  Marybelle Killings 02/24/2021, 7:38 AM

## 2021-02-24 NOTE — TOC Progression Note (Signed)
Transition of Care [facility]) - Progression Note    Patient Details  Name: BREASIA KARGES MRN: [id number] Date of Birth: 1950-09-29  [facility] CM/SW Contact  Joanne Chars, LCSW Phone Number: 02/24/2021, 11:06 AM  Clinical Narrative:   CSW informed after progression that SNF workup needed.  CSW spoke with pt who reluctantly agrees to SNF referral.  Pt confirms that she does not have anyone who can provide 24/7 supervision and she lives alone in a mobile home.  Choice document provided.  Pt is not vaccinated for covid.  Permission given to speak with sister Hilda Blades and daughter [name].            Expected Discharge Plan and Services                                                 Social Determinants of Health (SDOH) Interventions    Readmission Risk Interventions No flowsheet data found.

## 2021-02-24 NOTE — [place] FL2 (Signed)
[place] MEDICAID FL2 LEVEL OF CARE SCREENING TOOL     IDENTIFICATION  Patient Name: Regina Mcmahon Birthdate: 05/16/1951 Sex: female Admission Date (Current Location): 02/22/2021  [facility] and [place] Number:  Herbalist and Address:  [facility] [place] [place] [address], [place], [place] [address]      Provider Number: [id number]  Attending Physician Name and Address:  Marybelle Killings, MD  Relative Name and Phone Number:  Pennie Rushing [phone]  [phone]    Current Level of Care: Hospital Recommended Level of Care: [place] Prior Approval Number:    Date Approved/Denied:   PASRR Number: [id number]  Discharge Plan: SNF    Current Diagnoses: Patient Active Problem List   Diagnosis Date Noted   Arthritis of right hip 02/22/2021   Unilateral primary osteoarthritis, right hip 01/21/2021   COPD GOLD II  /  still smoking  10/12/2020   Shortness of breath 09/12/2016   Well woman exam with routine gynecological exam 04/20/2016   Morbid obesity due to excess calories ([place]) 03/03/2016   Colon adenomas 03/03/2016   Rash 01/18/2016   GERD (gastroesophageal reflux disease) 10/19/2015   Osteopenia determined by x-ray 09/07/2015   High risk medication use 09/07/2015   Genetic testing 03/20/2015   Family history of ovarian cancer 03/20/2015   Family history of colon cancer 03/20/2015   Family history of cancer 03/20/2015   Carcinoma of upper-outer quadrant of left female breast ([place]) 09/03/2014   Anxiety 10/29/2012   Blood in stool 10/29/2012   Pain 10/29/2012   Chronic airway obstruction ([place]) 10/29/2012   Diarrhea 10/29/2012   Cigarette smoker 10/29/2012   Essential hypertension 10/29/2012    Orientation RESPIRATION BLADDER Height & Weight     Self, Time, Situation, Place  O2 Continent Weight: 217 lb (98.4 kg) Height:  5' 3.5" (161.3 cm)  BEHAVIORAL SYMPTOMS/MOOD NEUROLOGICAL BOWEL NUTRITION STATUS      Continent  Diet (see discharge summary)  AMBULATORY STATUS COMMUNICATION OF NEEDS Skin   Limited Assist Verbally Surgical wounds                       Personal Care Assistance Level of Assistance  Bathing, Feeding, Dressing Bathing Assistance: Limited assistance Feeding assistance: Independent Dressing Assistance: Limited assistance     Functional Limitations Info  Sight, Hearing, Speech Sight Info: Adequate Hearing Info: Adequate Speech Info: Adequate    SPECIAL CARE FACTORS FREQUENCY  PT (By licensed PT)     PT Frequency: 5x week              Contractures Contractures Info: Not present    Additional Factors Info  Code Status, Allergies Code Status Info: full Allergies Info: NKA           Current Medications (02/24/2021):  This is the current hospital active medication list Current Facility-Administered Medications  Medication Dose Route Frequency Provider Last Rate Last Admin   0.9 %  sodium chloride infusion   Intravenous Continuous Lanae Crumbly, PA-C   Stopped at 02/23/21 1836   albuterol (PROVENTIL) (2.5 MG/3ML) 0.083% nebulizer solution 3 mL  3 mL Inhalation Q6H PRN Lanae Crumbly, PA-C       ALPRAZolam Duanne Moron) tablet 0.5 mg  0.5 mg Oral TID PRN Lanae Crumbly, PA-C       aspirin EC tablet 325 mg  325 mg Oral Q breakfast Lanae Crumbly, PA-C   325 mg at 02/24/21 5176  Chlorhexidine Gluconate Cloth 2 % PADS 6 each  6 each Topical Daily Marybelle Killings, MD   6 each at 02/24/21 0831   docusate sodium (COLACE) capsule 100 mg  100 mg Oral BID Lanae Crumbly, PA-C   100 mg at 02/24/21 0830   furosemide (LASIX) tablet 20 mg  20 mg Oral Daily Lanae Crumbly, PA-C   20 mg at 02/24/21 6803   gabapentin (NEURONTIN) capsule 300 mg  300 mg Oral QHS Lanae Crumbly, PA-C   300 mg at 02/23/21 2053   hydrochlorothiazide (MICROZIDE) capsule 12.5 mg  12.5 mg Oral Daily Lanae Crumbly, PA-C   12.5 mg at 02/24/21 2122   HYDROcodone-acetaminophen (NORCO/VICODIN) 5-325 MG per tablet 1-2  tablet  1-2 tablet Oral Q4H PRN Lanae Crumbly, PA-C   1 tablet at 02/24/21 4825   menthol-cetylpyridinium (CEPACOL) lozenge 3 mg  1 lozenge Oral PRN Lanae Crumbly, PA-C       Or   phenol (CHLORASEPTIC) mouth spray 1 spray  1 spray Mouth/Throat PRN Lanae Crumbly, PA-C       methocarbamol (ROBAXIN) tablet 500 mg  500 mg Oral Q6H PRN Lanae Crumbly, PA-C   500 mg at 02/24/21 0037   Or   methocarbamol (ROBAXIN) 500 mg in dextrose 5 % 50 mL IVPB  500 mg Intravenous Q6H PRN Benjiman Core M, PA-C       metoCLOPramide (REGLAN) tablet 5-10 mg  5-10 mg Oral Q8H PRN Benjiman Core M, PA-C       Or   metoCLOPramide (REGLAN) injection 5-10 mg  5-10 mg Intravenous Q8H PRN Benjiman Core M, PA-C       ondansetron [facility]) tablet 4 mg  4 mg Oral Q6H PRN Lanae Crumbly, PA-C       Or   ondansetron [facility]) injection 4 mg  4 mg Intravenous Q6H PRN Benjiman Core M, PA-C       pantoprazole (PROTONIX) EC tablet 40 mg  40 mg Oral Daily Benjiman Core M, PA-C   40 mg at 02/24/21 0488   polyethylene glycol (MIRALAX / GLYCOLAX) packet 17 g  17 g Oral Daily PRN Lanae Crumbly, PA-C       potassium citrate (UROCIT-K) SR tablet 20 mEq  20 mEq Oral Daily Benjiman Core M, PA-C   20 mEq at 02/24/21 8916   sertraline (ZOLOFT) tablet 100 mg  100 mg Oral Daily Lanae Crumbly, PA-C   100 mg at 02/24/21 9450   umeclidinium-vilanterol (ANORO ELLIPTA) 62.5-25 MCG/INH 1 puff  1 puff Inhalation Daily PRN Lanae Crumbly, PA-C         Discharge Medications: Please see discharge summary for a list of discharge medications.  Relevant Imaging Results:  Relevant Lab Results:   Additional Information SSN [id number].  Pt is not vaccinated for covid.  Joanne Chars, LCSW

## 2021-02-24 NOTE — Progress Notes (Signed)
Physical Therapy Treatment Patient Details Name: Regina Mcmahon MRN: [id number] DOB: April 02, 1951 Today's Date: 02/24/2021    History of Present Illness Pt is a 70 y/o female s/p R THA, direct anterior approach. PMH including but not limited to HTN, COPD, current smoker.    PT Comments    Pt making very slow progress and remains very limited with functional mobility secondary to pain, weakness and anxiety/fear. She did tolerate initiation of gait training this session; however, had great difficulties ambulating 3' from the bed to the chair with RW and min A. She remained on 2L of supplemental O2 via [place] throughout with SpO2 fluctuating between 93% at best and 84% at the lowest with activity. PT still recommending pt d/c to SNF for further therapy services prior to returning home to maximize her safety and independence with functional mobility.   Pt would continue to benefit from skilled physical therapy services at this time while admitted and after d/c to address the below listed limitations in order to improve overall safety and independence with functional mobility.   Follow Up Recommendations  SNF;Supervision/Assistance - 24 hour;Other (comment) (pending further progress; pt remains very hesitant about SNF and wanting to return home)     Equipment Recommendations  3in1 (PT);Rolling walker with 5" wheels    Recommendations for Other Services       Precautions / Restrictions Precautions Precautions: Fall Precaution Comments: monitor SpO2 Restrictions Weight Bearing Restrictions: Yes RLE Weight Bearing: Weight bearing as tolerated    Mobility  Bed Mobility Overal bed mobility: Needs Assistance Bed Mobility: Supine to Sit     Supine to sit: Min assist     General bed mobility comments: increased time and effort needed, HOB elevated, use of bed rails, min A needed for R LE movement off of bed    Transfers Overall transfer level: Needs assistance Equipment used: Rolling  walker (2 wheeled) Transfers: Sit to/from Stand Sit to Stand: Min guard         General transfer comment: greatly increased time due to anxiety and effort due to pain and weakness; min guard for safety and cueing for safe hand placement  Ambulation/Gait Ambulation/Gait assistance: Min assist Gait Distance (Feet): 3 Feet Assistive device: Rolling walker (2 wheeled) Gait Pattern/deviations: Step-to pattern;Decreased step length - right;Decreased step length - left;Decreased stance time - right;Decreased stride length Gait velocity: decreased   General Gait Details: pt with great difficulty taking steps from the bed to the chair ~3' away; she became incredibly anxious and upset after taking a few steps and required maximal encouragement to continue. Verbal and tactile cueing also needed to maintain safe technique with RW, min A needed as well for RW management and step-by-step instructions to complete the task   Stairs             Wheelchair Mobility    Modified Rankin (Stroke Patients Only)       Balance Overall balance assessment: Needs assistance Sitting-balance support: Feet supported Sitting balance-Leahy Scale: Fair     Standing balance support: During functional activity;Bilateral upper extremity supported Standing balance-Leahy Scale: Poor                              Cognition Arousal/Alertness: Awake/alert Behavior During Therapy: Anxious Overall Cognitive Status: Impaired/Different from baseline Area of Impairment: Safety/judgement;Problem solving  Safety/Judgement: Decreased awareness of safety   Problem Solving: Difficulty sequencing;Requires verbal cues General Comments: with increased pain and stress, pt with very poor safety awareness and difficulties sequencing tasks      Exercises Total Joint Exercises Ankle Circles/Pumps: AROM;Both;20 reps;Seated Quad Sets: AROM;Strengthening;Right;10  reps;Seated Long Arc Quad: AAROM;Right;10 reps;Seated    General Comments        Pertinent Vitals/Pain Pain Assessment: Faces Faces Pain Scale: Hurts whole lot Pain Location: R hip with movement Pain Descriptors / Indicators: Grimacing;Guarding Pain Intervention(s): Repositioned;Monitored during session;Premedicated before session    Home Living                      Prior Function            PT Goals (current goals can now be found in the care plan section) Acute Rehab PT Goals PT Goal Formulation: With patient Time For Goal Achievement: 03/09/21 Potential to Achieve Goals: Good Progress towards PT goals: Progressing toward goals    Frequency    7X/week      PT Plan Current plan remains appropriate    Co-evaluation              AM-PAC PT "6 Clicks" Mobility   Outcome Measure  Help needed turning from your back to your side while in a flat bed without using bedrails?: A Little Help needed moving from lying on your back to sitting on the side of a flat bed without using bedrails?: A Little Help needed moving to and from a bed to a chair (including a wheelchair)?: A Little Help needed standing up from a chair using your arms (e.g., wheelchair or bedside chair)?: A Little Help needed to walk in hospital room?: A Lot Help needed climbing 3-5 steps with a railing? : Total 6 Click Score: 15    End of Session Equipment Utilized During Treatment: Gait belt Activity Tolerance: Patient limited by pain Patient left: in chair;with call bell/phone within reach;with chair alarm set Nurse Communication: Mobility status PT Visit Diagnosis: Other abnormalities of gait and mobility (R26.89);Pain Pain - Right/Left: Right Pain - part of body: Hip     Time: 4174-0814 PT Time Calculation (min) (ACUTE ONLY): 20 min  Charges:  $Therapeutic Activity: 8-22 mins                     Anastasio Champion, DPT  Acute Rehabilitation Services Office  [place] 02/24/2021, 10:13 AM

## 2021-02-24 NOTE — Progress Notes (Signed)
Physical Therapy Treatment Patient Details Name: Regina Mcmahon MRN: [id number] DOB: 08-04-1950 Today's Date: 02/24/2021    History of Present Illness Pt is a 70 y/o female s/p R THA, direct anterior approach. PMH including but not limited to HTN, COPD, current smoker.    PT Comments    Pt continuing to make very slow progress with functional mobility. However, she is adamant about d/c'ing home versus going to a SNF for rehab. She plans to d/c to her daughter's home where she will have 24/7 support. PT discussed that pt must be able to ambulate household distances and ascend/descend stairs prior to discharging. Pt and pt's daughter both expressed understanding.   Pt would continue to benefit from skilled physical therapy services at this time while admitted and after d/c to address the below listed limitations in order to improve overall safety and independence with functional mobility.     Follow Up Recommendations  Home health PT;Supervision/Assistance - 24 hour;Other (comment) (pt is now planning to d/c to her daughter's home where she will have 24/7 supervision/assistance)     Equipment Recommendations  3in1 (PT);Other (comment) (pt has RW at home)    Recommendations for Other Services       Precautions / Restrictions Precautions Precautions: Fall Precaution Comments: monitor SpO2 Restrictions Weight Bearing Restrictions: Yes RLE Weight Bearing: Weight bearing as tolerated    Mobility  Bed Mobility Overal bed mobility: Needs Assistance Bed Mobility: Supine to Sit;Sit to Supine     Supine to sit: Min assist Sit to supine: Mod assist   General bed mobility comments: increased time and effort needed, HOB elevated, use of bed rails, min A needed for R LE movement off of bed, mod A to return bilateral LEs    Transfers Overall transfer level: Needs assistance Equipment used: Rolling walker (2 wheeled) Transfers: Sit to/from [facility] Sit to Stand: Min  guard Stand pivot transfers: Min guard       General transfer comment: greatly increased time due to anxiety and effort due to pain and weakness; min guard for safety and cueing for safe hand placement  Ambulation/Gait Ambulation/Gait assistance: Min guard Gait Distance (Feet): 10 Feet Assistive device: Rolling walker (2 wheeled) Gait Pattern/deviations: Step-to pattern;Decreased step length - right;Decreased step length - left;Decreased stance time - right;Decreased stride length Gait velocity: decreased   General Gait Details: pt with very slow, cautious and guarded gait secondary to pain and anxiety, min guard and VC'ing for safety with use of RW   Stairs             Wheelchair Mobility    Modified Rankin (Stroke Patients Only)       Balance Overall balance assessment: Needs assistance Sitting-balance support: Feet supported Sitting balance-Leahy Scale: Fair     Standing balance support: During functional activity;Bilateral upper extremity supported Standing balance-Leahy Scale: Poor                              Cognition Arousal/Alertness: Awake/alert Behavior During Therapy: Anxious Overall Cognitive Status: Within Functional Limits for tasks assessed Area of Impairment: Safety/judgement;Problem solving                         Safety/Judgement: Decreased awareness of safety   Problem Solving: Difficulty sequencing;Requires verbal cues General Comments: with increased pain and stress, pt with very poor safety awareness and difficulties sequencing tasks  Exercises Total Joint Exercises Ankle Circles/Pumps: AROM;Both;20 reps;Seated Quad Sets: AROM;Strengthening;Right;10 reps;Seated Long Arc Quad: AAROM;Right;10 reps;Seated    General Comments        Pertinent Vitals/Pain Pain Assessment: Faces Faces Pain Scale: Hurts whole lot Pain Location: R hip with movement Pain Descriptors / Indicators: Grimacing;Guarding Pain  Intervention(s): Monitored during session;Repositioned    Home Living                      Prior Function            PT Goals (current goals can now be found in the care plan section) Acute Rehab PT Goals PT Goal Formulation: With patient Time For Goal Achievement: 03/09/21 Potential to Achieve Goals: Good Progress towards PT goals: Progressing toward goals    Frequency    7X/week      PT Plan Discharge plan needs to be updated    Co-evaluation              AM-PAC PT "6 Clicks" Mobility   Outcome Measure  Help needed turning from your back to your side while in a flat bed without using bedrails?: A Little Help needed moving from lying on your back to sitting on the side of a flat bed without using bedrails?: A Little Help needed moving to and from a bed to a chair (including a wheelchair)?: A Little Help needed standing up from a chair using your arms (e.g., wheelchair or bedside chair)?: A Little Help needed to walk in hospital room?: A Lot Help needed climbing 3-5 steps with a railing? : Total 6 Click Score: 15    End of Session Equipment Utilized During Treatment: Gait belt Activity Tolerance: Patient limited by pain Patient left: in bed;with call bell/phone within reach;with family/visitor present Nurse Communication: Mobility status PT Visit Diagnosis: Other abnormalities of gait and mobility (R26.89);Pain Pain - Right/Left: Right Pain - part of body: Hip     Time: 5465-6812 PT Time Calculation (min) (ACUTE ONLY): 56 min  Charges:  $Gait Training: 23-37 mins $Therapeutic Activity: 23-37 mins                     Anastasio Champion, DPT  Acute Rehabilitation Services Office [place] 02/24/2021, 1:51 PM

## 2021-02-25 DIAGNOSIS — J9601 Acute respiratory failure with hypoxia: Secondary | ICD-10-CM | POA: Diagnosis not present

## 2021-02-25 DIAGNOSIS — J449 Chronic obstructive pulmonary disease, unspecified: Secondary | ICD-10-CM

## 2021-02-25 DIAGNOSIS — F17200 Nicotine dependence, unspecified, uncomplicated: Secondary | ICD-10-CM

## 2021-02-25 MED ORDER — FUROSEMIDE 10 MG/ML IJ SOLN
40.0000 mg | Freq: Once | INTRAMUSCULAR | Status: AC
  Administered 2021-02-25: 40 mg via INTRAVENOUS
  Filled 2021-02-25: qty 4

## 2021-02-25 MED ORDER — NICOTINE 14 MG/24HR TD PT24
14.0000 mg | MEDICATED_PATCH | Freq: Every day | TRANSDERMAL | Status: DC
  Administered 2021-02-25: 14 mg via TRANSDERMAL
  Filled 2021-02-25 (×2): qty 1

## 2021-02-25 NOTE — Progress Notes (Signed)
   Subjective: 3 Days Post-Op Procedure(s) (LRB): RIGHT TOTAL HIP ARTHROPLASTY ANTERIOR APPROACH (Right) Patient reports pain as moderate.  " I want to go home with my family, I will have care around the clock, I do not want to go to a nursing home"  Objective: Vital signs in last 24 hours: Temp:  [98.3 F (36.8 C)-100.1 F (37.8 C)] 100.1 F (37.8 C) (08/04 0712) Pulse Rate:  [65-91] 91 (08/03 2013) Resp:  [17-19] 19 (08/04 0712) BP: (97-115)/(48-60) 108/58 (08/04 0712) SpO2:  [88 %-93 %] 88 % (08/04 0712)  Intake/Output from previous day: 08/03 0701 - 08/04 0700 In: -  Out: 600 [Urine:600] Intake/Output this shift: No intake/output data recorded.  Recent Labs    02/22/21 1139 02/23/21 0552 02/24/21 0203  HGB 16.7* 13.2 12.4   Recent Labs    02/23/21 0552 02/24/21 0203  WBC 11.0* 12.9*  RBC 4.12 3.86*  HCT 38.8 35.8*  PLT 203 174   Recent Labs    02/22/21 1139 02/23/21 0552  NA 138 137  K 3.3* 3.6  CL 102 102  CO2  --  30  BUN 10 10  CREATININE 0.60 0.77  GLUCOSE 96 115*  CALCIUM  --  8.8*   No results for input(s): LABPT, INR in the last 72 hours.  Neurologically intact No results found.  Assessment/Plan: 3 Days Post-Op Procedure(s) (LRB): RIGHT TOTAL HIP ARTHROPLASTY ANTERIOR APPROACH (Right) Up with therapy  Marybelle Killings 02/25/2021, 8:02 AM

## 2021-02-25 NOTE — Plan of Care (Signed)
  Problem: Education: Goal: Knowledge of General Education information will improve Description: Including pain rating scale, medication(s)/side effects and non-pharmacologic comfort measures Outcome: Completed/Met   Problem: Clinical Measurements: Goal: Ability to maintain clinical measurements within normal limits will improve Outcome: Completed/Met Goal: Will remain free from infection Outcome: Completed/Met Goal: Diagnostic test results will improve Outcome: Completed/Met Goal: Respiratory complications will improve Outcome: Completed/Met Goal: Cardiovascular complication will be avoided Outcome: Completed/Met   Problem: Activity: Goal: Risk for activity intolerance will decrease Outcome: Progressing   Problem: Nutrition: Goal: Adequate nutrition will be maintained Outcome: Completed/Met   Problem: Elimination: Goal: Will not experience complications related to bowel motility Outcome: Progressing Goal: Will not experience complications related to urinary retention Outcome: Completed/Met   Problem: Pain Managment: Goal: General experience of comfort will improve Outcome: Progressing   Problem: Safety: Goal: Ability to remain free from injury will improve Outcome: Progressing

## 2021-02-25 NOTE — Progress Notes (Signed)
Physical Therapy Treatment Patient Details Name: Regina Mcmahon MRN: [id number] DOB: 24-Jun-1951 Today's Date: 02/25/2021    History of Present Illness Pt is a 70 y/o female s/p R THA, direct anterior approach. PMH including but not limited to HTN, COPD, current smoker.    PT Comments    Pt able to progress to initiation of stair training this session. She tolerated ascending and descending 2 steps with use of bilateral handrails and min guard for safety. She continues to move very slowly with all mobility but is adamant about returning home (to her daughter's home). Pt would continue to benefit from skilled physical therapy services at this time while admitted and after d/c to address the below listed limitations in order to improve overall safety and independence with functional mobility.     Follow Up Recommendations  Home health PT;Supervision/Assistance - 24 hour     Equipment Recommendations  3in1 (PT)    Recommendations for Other Services       Precautions / Restrictions Precautions Precautions: Fall Restrictions Weight Bearing Restrictions: Yes RLE Weight Bearing: Weight bearing as tolerated    Mobility  Bed Mobility Overal bed mobility: Needs Assistance Bed Mobility: Supine to Sit     Supine to sit: Min assist     General bed mobility comments: min A needed to complete R LE movement off of bed    Transfers Overall transfer level: Needs assistance Equipment used: Rolling walker (2 wheeled) Transfers: Sit to/from Stand Sit to Stand: Min guard         General transfer comment: performed x1 from EOB and x1 from recliner chair  Ambulation/Gait Ambulation/Gait assistance: Min guard Gait Distance (Feet): 5 Feet (5' x2) Assistive device: Rolling walker (2 wheeled) Gait Pattern/deviations: Step-to pattern;Decreased step length - right;Decreased step length - left;Decreased stance time - right;Decreased stride length Gait velocity: decreased   General Gait  Details: pt with very slow, cautious and guarded gait secondary to pain and anxiety, min guard and VC'ing for safety with use of RW   Stairs Stairs: Yes Stairs assistance: Min guard Stair Management: Two rails;Step to pattern;Forwards Number of Stairs: 2 General stair comments: PT provided VC'ing and demonstration for technique with stairs, leading with L LE to ascend and leading with R LE to descend   Wheelchair Mobility    Modified Rankin (Stroke Patients Only)       Balance Overall balance assessment: Needs assistance Sitting-balance support: Feet supported Sitting balance-Leahy Scale: Fair     Standing balance support: During functional activity;Bilateral upper extremity supported Standing balance-Leahy Scale: Poor                              Cognition Arousal/Alertness: Awake/alert Behavior During Therapy: Anxious Overall Cognitive Status: Impaired/Different from baseline Area of Impairment: Problem solving                         Safety/Judgement: Decreased awareness of safety   Problem Solving: Difficulty sequencing;Requires verbal cues General Comments: with increased pain and stress, pt with very poor safety awareness and difficulties sequencing tasks      Exercises Total Joint Exercises Ankle Circles/Pumps: AROM;Right;20 reps;Seated Gluteal Sets: AROM;Strengthening;Both;10 reps;Seated Hip ABduction/ADduction: AAROM;Right;10 reps;Seated Long Arc Quad: Strengthening;Right;5 reps;Seated    General Comments        Pertinent Vitals/Pain Pain Assessment: Faces Pain Score: 10-Worst pain ever Faces Pain Scale: Hurts even more Pain Location: R hip with  movement Pain Descriptors / Indicators: Grimacing;Guarding Pain Intervention(s): Monitored during session;Repositioned    Home Living                      Prior Function            PT Goals (current goals can now be found in the care plan section) Acute Rehab PT  Goals PT Goal Formulation: With patient Time For Goal Achievement: 03/09/21 Potential to Achieve Goals: Good Progress towards PT goals: Progressing toward goals    Frequency    7X/week      PT Plan Current plan remains appropriate    Co-evaluation              AM-PAC PT "6 Clicks" Mobility   Outcome Measure  Help needed turning from your back to your side while in a flat bed without using bedrails?: A Little Help needed moving from lying on your back to sitting on the side of a flat bed without using bedrails?: A Little Help needed moving to and from a bed to a chair (including a wheelchair)?: A Little Help needed standing up from a chair using your arms (e.g., wheelchair or bedside chair)?: A Little Help needed to walk in hospital room?: A Lot Help needed climbing 3-5 steps with a railing? : A Little 6 Click Score: 17    End of Session Equipment Utilized During Treatment: Gait belt Activity Tolerance: Patient limited by pain Patient left: in chair;with call bell/phone within reach Nurse Communication: Mobility status PT Visit Diagnosis: Other abnormalities of gait and mobility (R26.89);Pain Pain - Right/Left: Right Pain - part of body: Hip     Time: 2761-4709 PT Time Calculation (min) (ACUTE ONLY): 36 min  Charges:  $Gait Training: 8-22 mins $Therapeutic Activity: 8-22 mins                     Anastasio Champion, DPT  Acute Rehabilitation Services Office [place] 02/25/2021, 12:57 PM

## 2021-02-25 NOTE — Progress Notes (Addendum)
NAME:  Regina Mcmahon, MRN:  [id number], DOB:  [birth year], LOS: 2 ADMISSION DATE:  02/22/2021, CONSULTATION DATE:  02/23/21 REFERRING MD:  Dutch Gray, CHIEF COMPLAINT:  hypoxia  History of Present Illness:  Regina Mcmahon is a 70 y/o woman with a history of COPD with ongoing tobacco abuse who was admitted for R THA. Pulmonology was consulted for hypoxia and inability to wean off oxygen. She denies fever, chills, SOB, cough, sputum production. She has some pain with deep breathing, mostly in her throat and central chest with a  burning sensation in her airway she attributes to being on oxygen. She continues to smoke 2ppd and has some baseline DOE but no significant wheezing. She coughs in the morning, but does not cough throughout the day at baseline and does not have sputum production at baseline. She is prescribed Anoro PTA, but uses it only as needed, usually 1-2 times per week when her symptoms are worse than baseline. Today she requires oxygen with desaturation to the upper 80s when off oxygen. She has been having pain that is difficult to control, and her primary team is trying to wean her off higher dose opiates. She has been out of bed part of the day.   SpO2 100% at recent office visit 01/11/21  FVC 2.63 (84%) FEV1 1.59 (67%) Ratio 61>75% TLC 129% RV 178% DLCO 42% No significant BD reversibility  Pertinent  Medical History  Breast cancer COPD GERD Tobacco abuse HTN   Significant Hospital Events: Including procedures, antibiotic start and stop dates in addition to other pertinent events   8/1 THA  Interim History / Subjective:  Very short of breath when walking to the bathroom.  Says she is done smoking and would like a nicotine patch.   Objective   Blood pressure (!) 108/58, pulse 75, temperature 99.7 F (37.6 C), temperature source Oral, resp. rate 18, height 5' 3.5" (1.613 m), weight 98.4 kg, SpO2 91 %.        Intake/Output Summary (Last 24 hours) at 02/25/2021 1045 Last data  filed at 02/25/2021 0700 Gross per 24 hour  Intake --  Output 600 ml  Net -600 ml    Filed Weights   02/22/21 1051  Weight: 98.4 kg    Examination: General: chronically ill, sob with exertion HENT: [place]/AT, eyes anicteric, forehead oxygen probe Lungs:diminished bilaterally, no wheezes or crackles Cardiovascular: S1S2, RRR Abdomen: obese, soft, NT Extremities: trace le edema Neuro: awake, alert, moving all extremities on command Derm: warm, dry, no rashes  CXR personally reviewed> pulmonary edema, possible retrocardiac opacity. Compared to 2016 CXR, pulm edema present, port-a-cath since removed. WBC 12.9, Hgb 12.4 from 8/3   Resolved Hospital Problem list     Assessment & Plan:  Acute respiratory failure with hypoxia-- presentation and CXR consistent with atelectasis rather than pneumonia. Possibly pulmonary edema, but difficult to assess fully on portable CXR.  She may also have been hypoxemic at home with exertion, but since mobility was limited, difficult to know for sure.  Asked nurse to obtain better wave form with different puls-oximetery and perform desaturation study. She is desatting to the mid to low 80s on exertion currently. I think it is very likely she will need oxygen at discharge.  Moderate COPD-- not acutely exacerbated - will give another dose of IV lasix today.  - No need for steroids/abx -Reinforced the importance of OOB mobility -IS frequently, we reviewed this as well.  -Con't daily Anoro - will have her follow up in  pulmonary clinic after discharge  Tobacco Use disorder - 2 ppd smoker prior to admission - says she would like to quit now - will provide nicotine patch  Lenice Llamas, MD Pulmonary and [place] (right click and "Reselect all SmartList Selections" daily)   Per primary  Labs   CBC: Recent Labs  Lab 02/18/21 1530 02/22/21 1139 02/23/21 0552 02/24/21 0203  WBC 9.2  --  11.0*  12.9*  HGB 17.4* 16.7* 13.2 12.4  HCT 50.2* 49.0* 38.8 35.8*  MCV 92.6  --  94.2 92.7  PLT 243  --  203 174     Basic Metabolic Panel: Recent Labs  Lab 02/18/21 1530 02/22/21 1139 02/23/21 0552  NA 139 138 137  K 2.8* 3.3* 3.6  CL 99 102 102  CO2 31  --  30  GLUCOSE 101* 96 115*  BUN 11 10 10   CREATININE 0.79 0.60 0.77  CALCIUM 9.3  --  8.8*    GFR: Estimated Creatinine Clearance: 77 mL/min (by C-G formula based on SCr of 0.77 mg/dL). Recent Labs  Lab 02/18/21 1530 02/23/21 0552 02/24/21 0203  WBC 9.2 11.0* 12.9*     Liver Function Tests: Recent Labs  Lab 02/18/21 1530  AST 16  ALT 16  ALKPHOS 68  BILITOT 1.2  PROT 6.4*  ALBUMIN 3.4*    No results for input(s): LIPASE, AMYLASE in the last 168 hours. No results for input(s): AMMONIA in the last 168 hours.  ABG    Component Value Date/Time   TCO2 27 02/22/2021 1139      Coagulation Profile: No results for input(s): INR, PROTIME in the last 168 hours.  Cardiac Enzymes: No results for input(s): CKTOTAL, CKMB, CKMBINDEX, TROPONINI in the last 168 hours.  HbA1C: Hgb A1c MFr Bld  Date/Time Value Ref Range Status  02/18/2021 03:30 PM 5.6 4.8 - 5.6 % Final    Comment:    (NOTE) Pre diabetes:          5.7%-6.4%  Diabetes:              >6.4%  Glycemic control for   <7.0% adults with diabetes     CBG: Recent Labs  Lab 02/22/21 1056  GLUCAP 103*

## 2021-02-25 NOTE — Plan of Care (Signed)

## 2021-02-25 NOTE — Progress Notes (Signed)
Physical Therapy Treatment Patient Details Name: Regina Mcmahon MRN: [id number] DOB: 1951-03-02 Today's Date: 02/25/2021    History of Present Illness Pt is a 70 y/o female s/p R THA, direct anterior approach. PMH including but not limited to HTN, COPD, current smoker.    PT Comments    Pt continuing to make very slow progress overall with functional mobility. PT reiterated what pt would need to be able to do prior to safely d/c'ing home (walk household distances and ascend/descend stairs). Pt expressed understanding; however, refusing further mobility after ambulating only 5' in her room secondary to reported 10/10 pain in her R hip. Pt would continue to benefit from skilled physical therapy services at this time while admitted and after d/c to address the below listed limitations in order to improve overall safety and independence with functional mobility.    Follow Up Recommendations  Home health PT;Supervision/Assistance - 24 hour;Other (comment) (pt is adamant about d/c'ing to her daughter's house; she will have 24/7 supervision/assistance there)     Equipment Recommendations  3in1 (PT)    Recommendations for Other Services       Precautions / Restrictions Precautions Precautions: Fall Restrictions Weight Bearing Restrictions: Yes RLE Weight Bearing: Weight bearing as tolerated    Mobility  Bed Mobility Overal bed mobility: Needs Assistance Bed Mobility: Supine to Sit     Supine to sit: Min assist     General bed mobility comments: min A needed to complete R LE movement off of bed    Transfers Overall transfer level: Needs assistance Equipment used: Rolling walker (2 wheeled) Transfers: Sit to/from Stand Sit to Stand: Min guard         General transfer comment: greatly increased time due to anxiety and effort due to pain and weakness; min guard for safety and cueing for safe hand placement  Ambulation/Gait Ambulation/Gait assistance: Min guard Gait Distance  (Feet): 5 Feet Assistive device: Rolling walker (2 wheeled) Gait Pattern/deviations: Step-to pattern;Decreased step length - right;Decreased step length - left;Decreased stance time - right;Decreased stride length Gait velocity: decreased   General Gait Details: pt with very slow, cautious and guarded gait secondary to pain and anxiety, min guard and VC'ing for safety with use of RW   Stairs             Wheelchair Mobility    Modified Rankin (Stroke Patients Only)       Balance Overall balance assessment: Needs assistance Sitting-balance support: Feet supported Sitting balance-Leahy Scale: Fair     Standing balance support: During functional activity;Bilateral upper extremity supported Standing balance-Leahy Scale: Poor                              Cognition Arousal/Alertness: Awake/alert Behavior During Therapy: Anxious Overall Cognitive Status: Impaired/Different from baseline Area of Impairment: Safety/judgement;Problem solving                         Safety/Judgement: Decreased awareness of safety   Problem Solving: Difficulty sequencing;Requires verbal cues General Comments: with increased pain and stress, pt with very poor safety awareness and difficulties sequencing tasks      Exercises Total Joint Exercises Ankle Circles/Pumps: AROM;Right;20 reps;Seated Gluteal Sets: AROM;Strengthening;Both;10 reps;Seated Hip ABduction/ADduction: AAROM;Right;10 reps;Seated Long Arc Quad: Strengthening;Right;5 reps;Seated    General Comments        Pertinent Vitals/Pain Pain Assessment: 0-10 Pain Score: 10-Worst pain ever Pain Location: R hip with movement  Pain Descriptors / Indicators: Grimacing;Guarding Pain Intervention(s): Monitored during session;Limited activity within patient's tolerance    Home Living                      Prior Function            PT Goals (current goals can now be found in the care plan section)  Acute Rehab PT Goals PT Goal Formulation: With patient Time For Goal Achievement: 03/09/21 Potential to Achieve Goals: Good Progress towards PT goals: Progressing toward goals    Frequency    7X/week      PT Plan Current plan remains appropriate    Co-evaluation              AM-PAC PT "6 Clicks" Mobility   Outcome Measure  Help needed turning from your back to your side while in a flat bed without using bedrails?: A Little Help needed moving from lying on your back to sitting on the side of a flat bed without using bedrails?: A Little Help needed moving to and from a bed to a chair (including a wheelchair)?: A Little Help needed standing up from a chair using your arms (e.g., wheelchair or bedside chair)?: A Little Help needed to walk in hospital room?: A Lot Help needed climbing 3-5 steps with a railing? : Total 6 Click Score: 15    End of Session Equipment Utilized During Treatment: Gait belt Activity Tolerance: Patient limited by pain Patient left: in chair;with call bell/phone within reach Nurse Communication: Mobility status PT Visit Diagnosis: Other abnormalities of gait and mobility (R26.89);Pain Pain - Right/Left: Right Pain - part of body: Hip     Time: 9794-8016 PT Time Calculation (min) (ACUTE ONLY): 18 min  Charges:  $Therapeutic Activity: 8-22 mins                     Anastasio Champion, DPT  Acute Rehabilitation Services Office [place] 02/25/2021, 10:01 AM

## 2021-02-26 ENCOUNTER — Inpatient Hospital Stay (HOSPITAL_COMMUNITY): Payer: Medicare HMO

## 2021-02-26 DIAGNOSIS — R0902 Hypoxemia: Secondary | ICD-10-CM

## 2021-02-26 DIAGNOSIS — M7989 Other specified soft tissue disorders: Secondary | ICD-10-CM | POA: Diagnosis not present

## 2021-02-26 DIAGNOSIS — Z9889 Other specified postprocedural states: Secondary | ICD-10-CM

## 2021-02-26 DIAGNOSIS — M79604 Pain in right leg: Secondary | ICD-10-CM

## 2021-02-26 LAB — BASIC METABOLIC PANEL
Anion gap: 8 (ref 5–15)
BUN: 15 mg/dL (ref 8–23)
CO2: 32 mmol/L (ref 22–32)
Calcium: 8.9 mg/dL (ref 8.9–10.3)
Chloride: 96 mmol/L — ABNORMAL LOW (ref 98–111)
Creatinine, Ser: 0.82 mg/dL (ref 0.44–1.00)
GFR, Estimated: >60 mL/min (ref >60)
Glucose, Bld: 139 mg/dL — ABNORMAL HIGH (ref 70–99)
Potassium: 3.5 mmol/L (ref 3.5–5.1)
Sodium: 136 mmol/L (ref 135–145)

## 2021-02-26 LAB — MAGNESIUM: Magnesium: 2 mg/dL (ref 1.7–2.4)

## 2021-02-26 IMAGING — DX DG CHEST 1V PORT
1 series · 1 of 1 positions shown · non-contrast
Comparison: [DATE].

EXAM:
PORTABLE CHEST 1 VIEW

[chest]
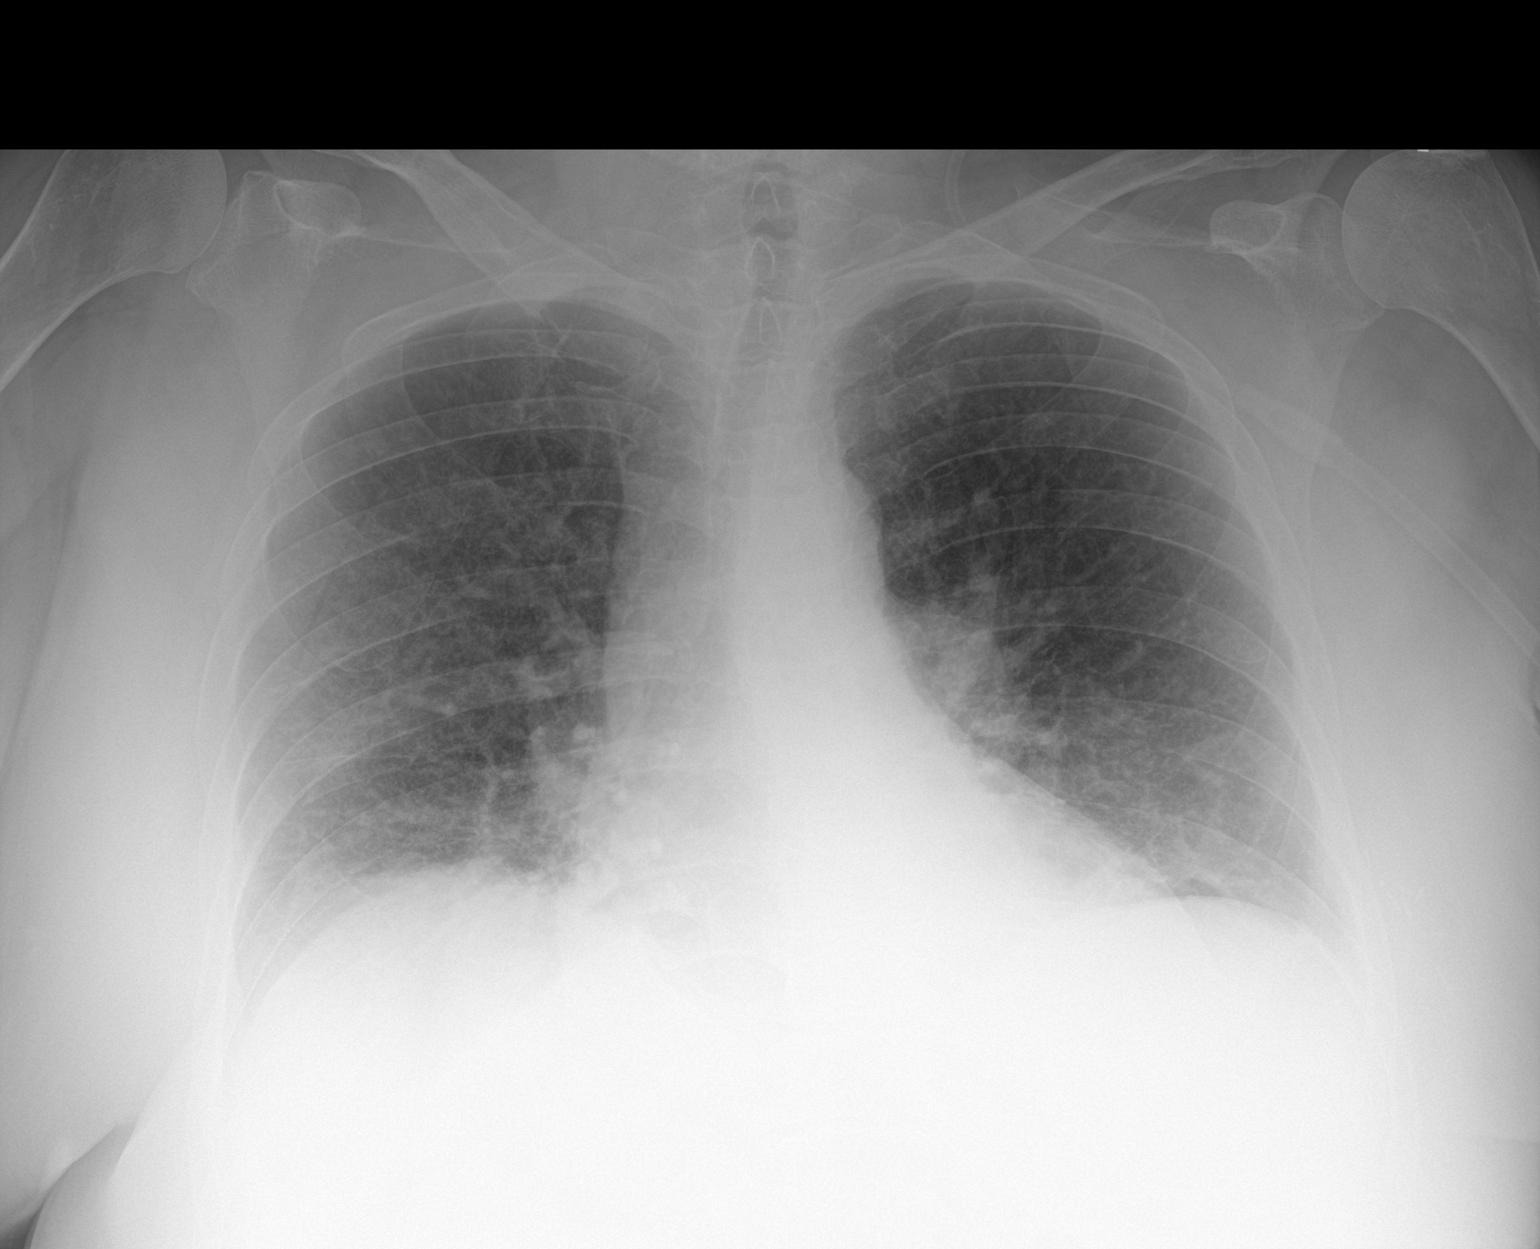

[1 of 1 positions shown; findings below may reference images not displayed]

FINDINGS: Low lung volumes. Similar medial left basilar opacity. Mild
interstitial prominence. No visible pleural effusions or
pneumothorax. Cardiomediastinal silhouette is within normal limits.
No acute osseous abnormality.
IMPRESSION: 1. Similar medial left basilar opacity, which could represent
atelectasis or pneumonia.
2. Mild interstitial prominence, possibly representing mild
interstitial edema although low lung volumes and portable technique
limit evaluation.

## 2021-02-26 MED ORDER — UMECLIDINIUM-VILANTEROL 62.5-25 MCG/INH IN AEPB
1.0000 | INHALATION_SPRAY | Freq: Every day | RESPIRATORY_TRACT | Status: DC
  Administered 2021-02-27: 1 via RESPIRATORY_TRACT

## 2021-02-26 MED ORDER — ALBUTEROL SULFATE (2.5 MG/3ML) 0.083% IN NEBU
3.0000 mL | INHALATION_SOLUTION | Freq: Four times a day (QID) | RESPIRATORY_TRACT | Status: DC
  Administered 2021-02-26 – 2021-02-27 (×3): 3 mL via RESPIRATORY_TRACT
  Filled 2021-02-26 (×3): qty 3

## 2021-02-26 MED ORDER — ALBUTEROL SULFATE (2.5 MG/3ML) 0.083% IN NEBU: 3.0000 mL | INHALATION_SOLUTION | Freq: Four times a day (QID) | RESPIRATORY_TRACT | Status: DC

## 2021-02-26 NOTE — Progress Notes (Signed)
Right lower extremity venous duplex completed. Refer to "CV Proc" under chart review to view preliminary results.  02/26/2021 12:09 PM Kelby Aline., MHA, RVT, RDCS, RDMS

## 2021-02-26 NOTE — Progress Notes (Signed)
PT Cancellation Note  Patient Details Name: Regina Mcmahon MRN: [id number] DOB: 07-20-1951   Cancelled Treatment:    Reason Eval/Treat Not Completed: (P) Medical issues which prohibited therapy (will defer PT as she awaits doppler study)   Khristie Sak Eli Hose 02/26/2021, 12:02 PM  Erasmo Leventhal , PTA Acute Rehabilitation Services Pager [phone] Office [phone]

## 2021-02-26 NOTE — Plan of Care (Signed)

## 2021-02-26 NOTE — Progress Notes (Signed)
Inpatient Rehab Admissions Coordinator:   CIR consult received and chart reviewed. PT is recommending HH and Pt. Is ambulating at min guard already. I do not believe Pt. Requires intensity of CIR at this time. I discussed with pt. And daughter and they state they would prefer HH. In addition, Pt.'s insurance is unlikely to approve CIR. I will sign off at this time. Pt.'s daughter requests that TOC reach out to her about setting up [facility] and making sure Pt.'s breathing treatments are available if she discharges home.   Clemens Catholic, [place], [place] Admissions Coordinator  [phone] ([place]) [phone] (office)

## 2021-02-26 NOTE — Care Management Important Message (Signed)
Important Message  Patient Details  Name: Regina Mcmahon MRN: [id number] Date of Birth: 30-Apr-1951   Medicare Important Message Given:  Yes     Orbie Pyo 02/26/2021, 2:52 PM

## 2021-02-26 NOTE — Progress Notes (Addendum)
Patient ID: Regina Mcmahon, female   DOB: Jan 03, 1951, 70 y.o.   MRN: [id number] Plan Home in AM Saturday with family. Home O2.  Face to face placed for HHPT.

## 2021-02-26 NOTE — TOC Initial Note (Signed)
Transition of Care [facility]) - Initial/Assessment Note    Patient Details  Name: Regina Mcmahon MRN: [id number] Date of Birth: Aug 05, 1950  [facility] of [facility]) CM/SW Contact:    Sharin Mons, RN Phone Number: 02/26/2021, 3:14 PM  Clinical Narrative:                      - s/p R THA, 8/4 From home alone. PTA independent with ADL'S, used cane with ambulation.  Pt will reside with daughter once d/c. Address: [address], [place] [address] PT evaluation shared. Agreeable to home health services. Choice offered. Pt without preference. Referral made with [facility] and accepted. Referral made with [employer] for 3in1/BSC, equipment will be delivered to bedside prior to d/c.  PCP: Allyn Kenner   TOC following and will continue to assist with TOC needs...  Expected Discharge Plan: [place] Barriers to Discharge: Continued Medical Work up   Patient Goals and CMS Choice     Choice offered to / list presented to : Patient  Expected Discharge Plan and Services Expected Discharge Plan: [place]   Discharge Planning Services: CM Consult                     DME Arranged: 3-N-1 DME Agency: [employer] Date DME Agency Contacted: 02/26/21 Time DME Agency Contacted: [phone] Representative spoke with at DME Agency: Freda Munro HH Arranged: PT [place]: [place] Date [place]: 02/26/21 Time [place]: 65 Representative spoke with at [place]: [place] Arrangements/Services   Lives with:: Self Patient language and need for interpreter reviewed:: Yes Do you feel safe going back to the place where you live?: Yes      Need for Family Participation in Patient Care: Yes (Comment) Care giver support system in place?: Yes (comment) Current home services: DME (cane) Criminal Activity/Legal Involvement Pertinent to Current Situation/Hospitalization: No - Comment as needed  Activities of Daily  Living Home Assistive Devices/Equipment: Environmental consultant (specify type), Cane (specify quad or straight), Shower chair with back, Eyeglasses, Dentures (specify type) ADL Screening (condition at time of admission) Patient's cognitive ability adequate to safely complete daily activities?: Yes Is the patient deaf or have difficulty hearing?: No Does the patient have difficulty seeing, even when wearing glasses/contacts?: No Does the patient have difficulty concentrating, remembering, or making decisions?: No Patient able to express need for assistance with ADLs?: Yes Does the patient have difficulty dressing or bathing?: No Independently performs ADLs?: Yes (appropriate for developmental age) Does the patient have difficulty walking or climbing stairs?: Yes Weakness of Legs: Right Weakness of Arms/Hands: None  Permission Sought/Granted   Permission granted to share information with : Yes, Verbal Permission Granted              Emotional Assessment Appearance:: Appears stated age Attitude/Demeanor/Rapport: Engaged Affect (typically observed): Accepting Orientation: : Oriented to Self, Oriented to Place, Oriented to  Time, Oriented to Situation Alcohol / Substance Use: Not Applicable Psych Involvement: No (comment)  Admission diagnosis:  Arthritis of right hip [M16.11] Patient Active Problem List   Diagnosis Date Noted   Arthritis of right hip 02/22/2021   Unilateral primary osteoarthritis, right hip 01/21/2021   COPD GOLD II  /  still smoking  10/12/2020   Shortness of breath 09/12/2016   Well woman exam with routine gynecological exam 04/20/2016   Morbid obesity due to excess calories ([place]) 03/03/2016   Colon adenomas  03/03/2016   Rash 01/18/2016   GERD (gastroesophageal reflux disease) 10/19/2015   Osteopenia determined by x-ray 09/07/2015   High risk medication use 09/07/2015   Genetic testing 03/20/2015   Family history of ovarian cancer 03/20/2015   Family history of colon  cancer 03/20/2015   Family history of cancer 03/20/2015   Carcinoma of upper-outer quadrant of left female breast ([place]) 09/03/2014   Anxiety 10/29/2012   Blood in stool 10/29/2012   Pain 10/29/2012   Chronic airway obstruction ([place]) 10/29/2012   Diarrhea 10/29/2012   Cigarette smoker 10/29/2012   Essential hypertension 10/29/2012   PCP:  [name], MD Pharmacy:   [place], [place] - [place] [address] [place] [address] Phone: [phone] Fax: [phone]     Social Determinants of Health (SDOH) Interventions    Readmission Risk Interventions No flowsheet data found.

## 2021-02-26 NOTE — Progress Notes (Signed)
PT Cancellation Note  Patient Details Name: Regina Mcmahon MRN: [id number] DOB: January 11, 1951   Cancelled Treatment:    Reason Eval/Treat Not Completed: Other (comment) (MD in room, followed by respiratory. PTA waited x 15 min and patient still remains with other health care providers.  Will f/u as able.)   Patina Spanier Eli Hose 02/26/2021, 10:55 AM  Erasmo Leventhal , PTA Acute Rehabilitation Services Pager [phone] Office [phone]

## 2021-02-26 NOTE — Progress Notes (Signed)
RT NOTE: RT came to patient's room to assess patient and explain her new albuterol/metaneb orders. RT placed patient on metaneb for 5 minutes so patient could begin therapy and know what to expect at scheduled therapy times. Patient did very well with metaneb therapy. RT told patient she will receive her next metaneb therapy and albuterol treatment at 14:00 today. Vitals are stable. RT will continue to monitor.

## 2021-02-26 NOTE — Progress Notes (Signed)
Physical Therapy Treatment Patient Details Name: Regina Mcmahon MRN: [id number] DOB: 04-Mar-1951 Today's Date: 02/26/2021    History of Present Illness Pt is a 70 y/o female s/p R THA, direct anterior approach. PMH including but not limited to HTN, COPD, current smoker.    PT Comments    Supine in bed on arrival and agreeable to PT session.  Able to progress gt and tolerate exercises.  Will update recs per protocol. DOE is her largest limiting factor.    Follow Up Recommendations  Follow surgeon's recommendation for DC plan and follow-up therapies     Equipment Recommendations  3in1 (PT)    Recommendations for Other Services       Precautions / Restrictions Precautions Precautions: Fall Precaution Comments: monitor SpO2 Restrictions Weight Bearing Restrictions: Yes RLE Weight Bearing: Weight bearing as tolerated    Mobility  Bed Mobility Overal bed mobility: Needs Assistance Bed Mobility: Supine to Sit;Sit to Supine     Supine to sit: Supervision (with gt belt)     General bed mobility comments: Min assistance to move back to bed.    Transfers Overall transfer level: Needs assistance Equipment used: Rolling walker (2 wheeled) Transfers: Sit to/from Stand Sit to Stand: Supervision         General transfer comment: Cues for hand placement to and from seated surface.  Ambulation/Gait Ambulation/Gait assistance: Min guard Gait Distance (Feet): 90 Feet Assistive device: Rolling walker (2 wheeled) Gait Pattern/deviations: Step-to pattern;Decreased step length - right;Decreased step length - left;Decreased stance time - right;Decreased stride length Gait velocity: decreased   General Gait Details: pt with very slow, cautious and guarded gait secondary to pain and anxiety, min guard and VC'ing for safety with use of RW required x 3 standing rest breaks for breathing.   Stairs             Wheelchair Mobility    Modified Rankin (Stroke Patients Only)        Balance Overall balance assessment: Needs assistance Sitting-balance support: Feet supported Sitting balance-Leahy Scale: Fair       Standing balance-Leahy Scale: Fair                              Cognition Arousal/Alertness: Awake/alert Behavior During Therapy: Anxious Overall Cognitive Status: Within Functional Limits for tasks assessed                                        Exercises General Exercises - Lower Extremity Ankle Circles/Pumps: AROM;Both;10 reps;Supine Quad Sets: AROM;Right;10 reps;Supine Heel Slides: AAROM;Right;10 reps;Strengthening Hip ABduction/ADduction: AAROM;Right;10 reps;Supine    General Comments        Pertinent Vitals/Pain Pain Assessment: 0-10 Pain Score: 7  Pain Location: R hip with movement Pain Descriptors / Indicators: Grimacing;Guarding Pain Intervention(s): Monitored during session;Repositioned    Home Living                      Prior Function            PT Goals (current goals can now be found in the care plan section) Acute Rehab PT Goals Potential to Achieve Goals: Good Progress towards PT goals: Progressing toward goals    Frequency    7X/week      PT Plan Discharge plan needs to be updated    Co-evaluation  AM-PAC PT "6 Clicks" Mobility   Outcome Measure  Help needed turning from your back to your side while in a flat bed without using bedrails?: A Little Help needed moving from lying on your back to sitting on the side of a flat bed without using bedrails?: A Little Help needed moving to and from a bed to a chair (including a wheelchair)?: A Little Help needed standing up from a chair using your arms (e.g., wheelchair or bedside chair)?: A Little Help needed to walk in hospital room?: A Little Help needed climbing 3-5 steps with a railing? : A Little 6 Click Score: 18    End of Session Equipment Utilized During Treatment: Gait belt Activity  Tolerance: Patient limited by pain Patient left: in bed;with bed alarm set;with call bell/phone within reach Nurse Communication: Mobility status PT Visit Diagnosis: Other abnormalities of gait and mobility (R26.89);Pain Pain - Right/Left: Right Pain - part of body: Hip     Time: 4136-4383 PT Time Calculation (min) (ACUTE ONLY): 34 min  Charges:  $Gait Training: 8-22 mins $Therapeutic Activity: 8-22 mins                     Erasmo Leventhal , PTA Acute Rehabilitation Services Pager [phone] Office [phone]    Ellyson Rarick Eli Hose 02/26/2021, 4:41 PM

## 2021-02-26 NOTE — Progress Notes (Addendum)
Subjective: Right hip doing ok.  C/o right hip pain, calf pain and right LE swelling. C/o productive cough.  Being seen by respiratory therapy.  Patient states that she was agreeable with going to SNF but will not accept since she is not Covid vaccinated.   Objective: Vital signs in last 24 hours: Temp:  [98 F (36.7 C)-99.1 F (37.3 C)] 98 F (36.7 C) (08/05 0904) Pulse Rate:  [64-77] 64 (08/05 1053) Resp:  [17-18] 18 (08/05 1053) BP: (92-110)/(58-78) 110/71 (08/05 0904) SpO2:  [90 %-98 %] 96 % (08/05 1107) FiO2 (%):  [28 %] 28 % (08/05 1107)  Intake/Output from previous day: 08/04 0701 - 08/05 0700 In: -  Out: 200 [Urine:200] Intake/Output this shift: Total I/O In: 240 [P.O.:240] Out: -   Recent Labs    02/24/21 0203  HGB 12.4   Recent Labs    02/24/21 0203  WBC 12.9*  RBC 3.86*  HCT 35.8*  PLT 174   Recent Labs    02/26/21 1013  NA 136  K 3.5  CL 96*  CO2 32  BUN 15  CREATININE 0.82  GLUCOSE 139*  CALCIUM 8.9   No results for input(s): LABPT, INR in the last 72 hours.  Exam: Pleasant female, alert and oriented.  Right hip wound looks good. No drainage or signs of infection.  Does have mild-mod right LE swelling.  Marked right calf TTP.  Left LE unremarkable.     Assessment/Plan: -right LE pain/swelling.  Ordered stat venous doppler r/o DVT -will get CIR consult. Hopefully they will accept patient.   -greatly appreciate Pulmonology help.       Benjiman Core 02/26/2021, 11:17 AM

## 2021-02-26 NOTE — Progress Notes (Signed)
Patient ID: Regina Mcmahon, female   DOB: June 06, 1951, 70 y.o.   MRN: [id number]  I reviewed CIR note in chart.  Stated that patient's insurance unlikely to approve CIR.  They also documented "I discussed with pt. And daughter and they state they would prefer HH." This was different from my discussion with patient this morning.  Right LE venous doppler neg for DVT.    Plan: Patient's right hip is stable from ortho standpoint and is OK to discharge home when cleared by pulmonary team.

## 2021-02-26 NOTE — Progress Notes (Signed)
NAME:  Regina Mcmahon, MRN:  [id number], DOB:  [birth year], LOS: 3 ADMISSION DATE:  02/22/2021, CONSULTATION DATE:  02/23/21 REFERRING MD:  Dutch Gray, CHIEF COMPLAINT:  hypoxia  History of Present Illness:  Ms. Below is a 70 y/o woman with a history of COPD with ongoing tobacco abuse who was admitted for R THA. Pulmonology was consulted for hypoxia and inability to wean off oxygen. She denies fever, chills, SOB, cough, sputum production. She has some pain with deep breathing, mostly in her throat and central chest with a  burning sensation in her airway she attributes to being on oxygen. She continues to smoke 2ppd and has some baseline DOE but no significant wheezing. She coughs in the morning, but does not cough throughout the day at baseline and does not have sputum production at baseline. She is prescribed Anoro PTA, but uses it only as needed, usually 1-2 times per week when her symptoms are worse than baseline. Today she requires oxygen with desaturation to the upper 80s when off oxygen. She has been having pain that is difficult to control, and her primary team is trying to wean her off higher dose opiates. She has been out of bed part of the day.   SpO2 100% at recent office visit 01/11/21  FVC 2.63 (84%) FEV1 1.59 (67%) Ratio 61>75% TLC 129% RV 178% DLCO 42% No significant BD reversibility  Pertinent  Medical History  Breast cancer COPD GERD Tobacco abuse HTN   Significant Hospital Events: Including procedures, antibiotic start and stop dates in addition to other pertinent events   8/1 THA  Interim History / Subjective:  Hard to tell if she's really dyspneic when moving because she's so focused on the pain related to her surgery. Does have productive cough. No CP.  Objective   Blood pressure 110/71, pulse 65, temperature 98 F (36.7 C), resp. rate 18, height 5' 3.5" (1.613 m), weight 98.4 kg, SpO2 92 %.        Intake/Output Summary (Last 24 hours) at 02/26/2021 1007 Last  data filed at 02/25/2021 1205 Gross per 24 hour  Intake --  Output 200 ml  Net -200 ml   Filed Weights   02/22/21 1051  Weight: 98.4 kg    Examination: General: chronically ill, sob with exertion HENT: [place]/AT, eyes anicteric, forehead oxygen probe Lungs:rhonchorous bilaterally, no wheezes or crackles Cardiovascular: S1S2, RRR Abdomen: obese, soft, NT Extremities: trace le edema Neuro: awake, alert, moving all extremities on command Derm: warm, dry, no rashes  CXR personally reviewed> pulmonary edema, possible retrocardiac opacity. Compared to 2016 CXR, pulm edema present, port-a-cath since removed. WBC 12.9, Hgb 12.4 from 8/3   Resolved Hospital Problem list     Assessment & Plan:  Acute respiratory failure with hypoxia-- presentation and CXR consistent with atelectasis rather than pneumonia. Possibly pulmonary edema, but difficult to assess fully on portable CXR. She may also have been hypoxemic at home with exertion, but since mobility was limited, difficult to know for sure.  - check BMP, Mg before another dose of lasix (ordered today) - keep left lung up as much as pt will tolerate given location of her recent surgery - repeat CXR (ordered) - given productive cough and LLL atelectasis a couple days ago would also start metaneb if LLL still looks like it's down on today's CXR   Moderate COPD-- not acutely exacerbated -No need for steroids/abx -Reinforced the importance of OOB mobility -IS frequently, we reviewed this as well.  -Con't daily Anoro -  Schedule albuterol q6 while awake before metaneb as above for airway clearance if LLL still down -will have her follow up in pulmonary clinic after discharge  Tobacco Use disorder - 2 ppd smoker prior to admission - says she would like to quit now - provided nicotine patch  [place] (right click and "Reselect all SmartList Selections" daily)   Per primary  Labs    CBC: Recent Labs  Lab 02/22/21 1139 02/23/21 0552 02/24/21 0203  WBC  --  11.0* 12.9*  HGB 16.7* 13.2 12.4  HCT 49.0* 38.8 35.8*  MCV  --  94.2 92.7  PLT  --  203 976    Basic Metabolic Panel: Recent Labs  Lab 02/22/21 1139 02/23/21 0552  NA 138 137  K 3.3* 3.6  CL 102 102  CO2  --  30  GLUCOSE 96 115*  BUN 10 10  CREATININE 0.60 0.77  CALCIUM  --  8.8*   GFR: Estimated Creatinine Clearance: 77 mL/min (by C-G formula based on SCr of 0.77 mg/dL). Recent Labs  Lab 02/23/21 0552 02/24/21 0203  WBC 11.0* 12.9*    Liver Function Tests: No results for input(s): AST, ALT, ALKPHOS, BILITOT, PROT, ALBUMIN in the last 168 hours. No results for input(s): LIPASE, AMYLASE in the last 168 hours. No results for input(s): AMMONIA in the last 168 hours.  ABG    Component Value Date/Time   TCO2 27 02/22/2021 1139     Coagulation Profile: No results for input(s): INR, PROTIME in the last 168 hours.  Cardiac Enzymes: No results for input(s): CKTOTAL, CKMB, CKMBINDEX, TROPONINI in the last 168 hours.  HbA1C: Hgb A1c MFr Bld  Date/Time Value Ref Range Status  02/18/2021 03:30 PM 5.6 4.8 - 5.6 % Final    Comment:    (NOTE) Pre diabetes:          5.7%-6.4%  Diabetes:              >6.4%  Glycemic control for   <7.0% adults with diabetes     CBG: Recent Labs  Lab 02/22/21 1056  GLUCAP 103*

## 2021-02-27 ENCOUNTER — Telehealth: Payer: Self-pay | Admitting: Acute Care

## 2021-02-27 DIAGNOSIS — R0902 Hypoxemia: Secondary | ICD-10-CM | POA: Diagnosis not present

## 2021-02-27 MED ORDER — HYDROCODONE-ACETAMINOPHEN 5-325 MG PO TABS: 1.0000 | ORAL_TABLET | Freq: Four times a day (QID) | ORAL | 0 refills | Status: DC | PRN

## 2021-02-27 MED ORDER — ASPIRIN 325 MG PO TBEC: 325.0000 mg | DELAYED_RELEASE_TABLET | Freq: Every day | ORAL | 0 refills | Status: DC

## 2021-02-27 MED ORDER — ALBUTEROL SULFATE (2.5 MG/3ML) 0.083% IN NEBU
3.0000 mL | INHALATION_SOLUTION | Freq: Three times a day (TID) | RESPIRATORY_TRACT | Status: DC
  Filled 2021-02-27: qty 3

## 2021-02-27 NOTE — Progress Notes (Signed)
NAME:  Regina Mcmahon, MRN:  [id number], DOB:  Oct 01, 1950, LOS: 4 ADMISSION DATE:  02/22/2021, CONSULTATION DATE:  02/23/21 REFERRING MD:  Dutch Gray, CHIEF COMPLAINT:  hypoxia  History of Present Illness:  Regina Mcmahon is a 70 y/o woman with a history of COPD with ongoing tobacco abuse who was admitted for R THA. Pulmonology was consulted for hypoxia and inability to wean off oxygen. She denies fever, chills, SOB, cough, sputum production. She has some pain with deep breathing, mostly in her throat and central chest with a burning sensation in her airway she attributes to being on oxygen. She continues to smoke 2ppd and has some baseline DOE but no significant wheezing. She coughs in the morning, but does not cough throughout the day at baseline and does not have sputum production at baseline. She is prescribed Anoro PTA, but uses it only as needed, usually 1-2 times per week when her symptoms are worse than baseline. Today she requires oxygen with desaturation to the upper 80s when off oxygen. She has been having pain that is difficult to control, and her primary team is trying to wean her off higher dose opiates. She has been out of bed part of the day.   SpO2 100% at recent office visit 01/11/21  FVC 2.63 (84%) FEV1 1.59 (67%) Ratio 61>75% TLC 129% RV 178% DLCO 42% No significant BD reversibility  Pertinent  Medical History  Breast cancer COPD GERD Tobacco abuse HTN  Significant Hospital Events:  8/1 THA  Interim History / Subjective:  No acute events patient is anxious to be discharged home  No acute issues overnight  Objective   Blood pressure (!) 102/52, pulse 61, temperature 98.6 F (37 C), temperature source Oral, resp. rate 18, height 5' 3.5" (1.613 m), weight 98.4 kg, SpO2 94 %.    FiO2 (%):  [28 %] 28 %   Intake/Output Summary (Last 24 hours) at 02/27/2021 0753 Last data filed at 02/26/2021 2100 Gross per 24 hour  Intake 840 ml  Output --  Net 840 ml    Filed Weights    02/22/21 1051  Weight: 98.4 kg    Examination: General: Well appearing middle age female sitting up in edge of bed in NAD HEENT: [place]/AT, MM pink/moist, PERRL,  Neuro: Alert and oriented x3, non-focal  CV: s1s2 regular rate and rhythm, no murmur, rubs, or gallops,  PULM:  Slightly diminished, faint expiratory wheeze, no increased work of breathing oxygen saturations 92-94% on 2L  GI: soft, bowel sounds active in all 4 quadrants, non-tender, non-distended, tolerating TF Extremities: warm/dry, no edema  Skin: no rashes or lesions  Resolved Hospital Problem list     Assessment & Plan:  Acute respiratory failure with hypoxia- -Presentation and CXR consistent with atelectasis rather than pneumonia. Possibly pulmonary edema, but difficult to assess fully on portable CXR. She may also have been hypoxemic at home with exertion, but since mobility was limited, difficult to know for sure.  Moderate COPD -Not acutely exacerbated Tobacco Use disorder - 2 ppd smoker prior to admission, says she would like to quit now P: Patient is cleared from a pulmonary standpoint to be discharged home with home oxygen today  I will send a message to the office to set up a follow up apt with her primary pulmonologist Dr. Melvyn Novas Continue pulmoanry hygiene at discharge Encourage patient to purchase a home SPO2 monitor to track oxygen sats at home  Continue to mobilize frequently  Continue daily Anoro Continue Nicotine patch  at home, patient has a desire to quite   Best Practice:   Per primary   Critical care: N/A  Ravenna Legore D. Kenton Kingfisher, NP-C [facility] Personal contact information can be found on Amion  02/27/2021, 7:57 AM

## 2021-02-27 NOTE — TOC Transition Note (Signed)
Transition of Care [facility]) - CM/SW Discharge Note   Patient Details  Name: Regina Mcmahon MRN: [id number] Date of Birth: 1950/09/17  Transition of Care [facility]) CM/SW Contact:  Bartholomew Crews, RN Phone Number: [phone] 02/27/2021, 1:40 PM   Clinical Narrative:     Spoke with patient, daughter [name], and sister [name] at the bedside to discuss transition home. 3N1 at bedside for home use. Discussed follow up with [facility] for [facility] PT - HH PT order provided by MD. Patient to go to [facility] home while recovering.   Patient and family asking about DME home oxygen. Pulmonary note completed. Oxygen order received. Referral to [facility] for delivery to the room, then home.   Family to provide transportation home. No further TOC needs identified.   Final next level of care: [place] Barriers to Discharge: No Barriers Identified   Patient Goals and CMS Choice Patient states their goals for this hospitalization and ongoing recovery are:: return home with her daughter CMS Medicare.gov Compare Post Acute Care list provided to:: Patient Choice offered to / list presented to : Patient  Discharge Placement                       Discharge Plan and Services   Discharge Planning Services: CM Consult            DME Arranged: 3-N-1, Oxygen DME Agency: [facility] Date DME Agency Contacted: 02/27/21 Time DME Agency Contacted: [phone] Representative spoke with at DME Agency: [place]: PT [place]: [place] Date [place]: 02/27/21 Time [place]: [phone] Representative spoke with at [place]: [place]) Interventions     Readmission Risk Interventions No flowsheet data found.

## 2021-02-27 NOTE — Progress Notes (Signed)
SATURATION QUALIFICATIONS: (This note is used to comply with regulatory documentation for home oxygen)  Patient Saturations on Room Air at Rest = 87%  Patient Saturations on Room Air while Ambulating = 85%  Patient Saturations on 2 Liters of oxygen while Ambulating = 92%  Please briefly explain why patient needs home oxygen: oxygen saturation drops on room air

## 2021-02-27 NOTE — Progress Notes (Signed)
Physical Therapy Treatment Patient Details Name: Regina Mcmahon MRN: [id number] DOB: [birth year] Today's Date: 02/27/2021    History of Present Illness Pt is a 70 y/o female s/p R THA, direct anterior approach. PMH including but not limited to HTN, COPD, current smoker.    PT Comments    Pt seated edge of bed on arrival this session.  Pt continues to improve.  Required 4L[place] to mobilize and 2L [place] at rest.  Pt eager to return home with support of family and friends.  Continue to follow surgeons d/c plan.      Follow Up Recommendations  Follow surgeon's recommendation for DC plan and follow-up therapies     Equipment Recommendations  3in1 (PT)    Recommendations for Other Services       Precautions / Restrictions Precautions Precautions: Fall Precaution Comments: monitor SpO2 Restrictions Weight Bearing Restrictions: Yes RLE Weight Bearing: Weight bearing as tolerated    Mobility  Bed Mobility Overal bed mobility: Needs Assistance Bed Mobility: Sit to Supine       Sit to supine: Min assist   General bed mobility comments: seated edge of bed on arrival.  Pt required min assistance to move RLE back to bed against gravity.  encouraged use of gt belt to move LE back to bed.  She remains too anxious and painful to perform without assistance.    Transfers Overall transfer level: Needs assistance Equipment used: Rolling walker (2 wheeled) Transfers: Sit to/from Stand Sit to Stand: Supervision Stand pivot transfers: Supervision       General transfer comment: Cues for hand placement to and from seated surface.  Ambulation/Gait Ambulation/Gait assistance: Min guard;Min assist Gait Distance (Feet): 95 Feet Assistive device: Rolling walker (2 wheeled) Gait Pattern/deviations: Step-to pattern;Decreased step length - right;Decreased step length - left;Decreased stance time - right;Decreased stride length Gait velocity: decreased   General Gait Details: pt with very slow,  cautious and guarded gait secondary to pain and anxiety, min guard and VC'ing for safety with use of RW required x 3 standing rest breaks for breathing. Required 4L [place] for ambulation.   Stairs             Wheelchair Mobility    Modified Rankin (Stroke Patients Only)       Balance Overall balance assessment: Needs assistance Sitting-balance support: Feet supported Sitting balance-Leahy Scale: Fair       Standing balance-Leahy Scale: Fair                              Cognition Arousal/Alertness: Awake/alert Behavior During Therapy: Anxious Overall Cognitive Status: Within Functional Limits for tasks assessed                                        Exercises      General Comments        Pertinent Vitals/Pain Pain Assessment: 0-10 Pain Score: 7  Pain Location: R hip with movement Pain Descriptors / Indicators: Grimacing;Guarding Pain Intervention(s): Monitored during session;Repositioned    Home Living                      Prior Function            PT Goals (current goals can now be found in the care plan section) Acute Rehab PT Goals Patient Stated Goal: decrease pain  Potential to Achieve Goals: Good Progress towards PT goals: Progressing toward goals    Frequency    7X/week      PT Plan Current plan remains appropriate    Co-evaluation              AM-PAC PT "6 Clicks" Mobility   Outcome Measure  Help needed turning from your back to your side while in a flat bed without using bedrails?: A Little Help needed moving from lying on your back to sitting on the side of a flat bed without using bedrails?: A Little Help needed moving to and from a bed to a chair (including a wheelchair)?: A Little Help needed standing up from a chair using your arms (e.g., wheelchair or bedside chair)?: A Little Help needed to walk in hospital room?: A Little Help needed climbing 3-5 steps with a railing? : A Little 6  Click Score: 18    End of Session Equipment Utilized During Treatment: Gait belt Activity Tolerance: Patient limited by pain Patient left: in bed;with bed alarm set;with call bell/phone within reach Nurse Communication: Mobility status PT Visit Diagnosis: Other abnormalities of gait and mobility (R26.89);Pain Pain - Right/Left: Right Pain - part of body: Hip     Time: 7342-8768 PT Time Calculation (min) (ACUTE ONLY): 30 min  Charges:  $Gait Training: 8-22 mins $Therapeutic Activity: 8-22 mins                     Erasmo Leventhal , PTA Acute Rehabilitation Services Pager [phone] Office [phone]    Kollin Udell Eli Hose 02/27/2021, 10:51 AM

## 2021-02-27 NOTE — Progress Notes (Signed)
Pt discharged to home. DC instructions given with daughter [name] and sister [name] at bedside.  No concerns voiced. Pt left unit in wheelchair pushed by nurse tech accompanied by sister [name] when daughter [name] went to get the car. Left in stable condition. Copy of AVS given to sister [name].

## 2021-02-27 NOTE — Progress Notes (Signed)
Patient stable.  Has ambulated in hall.  She is doing well from pain standpoint. On exam leg lengths approximately equal with good dorsiflexion strength Plan at this time is discharge to home and follow-up with Dr. [name] in 10 to 14 days

## 2021-03-01 ENCOUNTER — Telehealth: Payer: Self-pay | Admitting: Orthopaedic Surgery

## 2021-03-01 NOTE — Telephone Encounter (Signed)
OK to refill

## 2021-03-01 NOTE — Telephone Encounter (Signed)
noted 

## 2021-03-01 NOTE — Telephone Encounter (Signed)
Ok to add on to any Thursday slot at end of day

## 2021-03-01 NOTE — Telephone Encounter (Signed)
Called and spoke with patient, attempted to make patient a hospital f/u appointment for 2-4 wks.  No 30 min availability in [place] office for Dr. Melvyn Novas through the month of September.  Advised I would check with Dr. Melvyn Novas to see if it would be ok to put her in a 15 minute slot.  Advised we would give her a call back once we hear back from Dr. Melvyn Novas.  She verbalized understanding.  Dr. Melvyn Novas, Your scheduled has no 30 minute slots available through the month of September.  This patient is a hospital f/u.  Please advise if ok to use a 15 min slot to get patient seen sooner.  Thank you.

## 2021-03-01 NOTE — Telephone Encounter (Signed)
Pt sister called and was wondering if pt could get a refill on gabapentin. She would like a call so she can figure out where she would like it sent.  CB [phone]

## 2021-03-01 NOTE — Telephone Encounter (Signed)
ATC, left VM. 

## 2021-03-02 NOTE — Telephone Encounter (Signed)
LMTCB

## 2021-03-04 ENCOUNTER — Other Ambulatory Visit: Payer: Self-pay

## 2021-03-04 ENCOUNTER — Encounter: Payer: Medicare HMO | Admitting: Orthopaedic Surgery

## 2021-03-05 DIAGNOSIS — R69 Illness, unspecified: Secondary | ICD-10-CM | POA: Diagnosis not present

## 2021-03-05 DIAGNOSIS — Z7951 Long term (current) use of inhaled steroids: Secondary | ICD-10-CM | POA: Diagnosis not present

## 2021-03-05 DIAGNOSIS — Z853 Personal history of malignant neoplasm of breast: Secondary | ICD-10-CM | POA: Diagnosis not present

## 2021-03-05 DIAGNOSIS — G4700 Insomnia, unspecified: Secondary | ICD-10-CM | POA: Diagnosis not present

## 2021-03-05 DIAGNOSIS — J449 Chronic obstructive pulmonary disease, unspecified: Secondary | ICD-10-CM | POA: Diagnosis not present

## 2021-03-05 DIAGNOSIS — R7303 Prediabetes: Secondary | ICD-10-CM | POA: Diagnosis not present

## 2021-03-05 DIAGNOSIS — Z471 Aftercare following joint replacement surgery: Secondary | ICD-10-CM | POA: Diagnosis not present

## 2021-03-05 DIAGNOSIS — K219 Gastro-esophageal reflux disease without esophagitis: Secondary | ICD-10-CM | POA: Diagnosis not present

## 2021-03-05 DIAGNOSIS — I8390 Asymptomatic varicose veins of unspecified lower extremity: Secondary | ICD-10-CM | POA: Diagnosis not present

## 2021-03-05 DIAGNOSIS — Z96641 Presence of right artificial hip joint: Secondary | ICD-10-CM | POA: Diagnosis not present

## 2021-03-05 DIAGNOSIS — Z6837 Body mass index (BMI) 37.0-37.9, adult: Secondary | ICD-10-CM | POA: Diagnosis not present

## 2021-03-05 DIAGNOSIS — I10 Essential (primary) hypertension: Secondary | ICD-10-CM | POA: Diagnosis not present

## 2021-03-05 DIAGNOSIS — Z7982 Long term (current) use of aspirin: Secondary | ICD-10-CM | POA: Diagnosis not present

## 2021-03-05 DIAGNOSIS — M8580 Other specified disorders of bone density and structure, unspecified site: Secondary | ICD-10-CM | POA: Diagnosis not present

## 2021-03-05 DIAGNOSIS — Z9981 Dependence on supplemental oxygen: Secondary | ICD-10-CM | POA: Diagnosis not present

## 2021-03-08 DIAGNOSIS — Z7951 Long term (current) use of inhaled steroids: Secondary | ICD-10-CM | POA: Diagnosis not present

## 2021-03-08 DIAGNOSIS — Z853 Personal history of malignant neoplasm of breast: Secondary | ICD-10-CM | POA: Diagnosis not present

## 2021-03-08 DIAGNOSIS — K219 Gastro-esophageal reflux disease without esophagitis: Secondary | ICD-10-CM | POA: Diagnosis not present

## 2021-03-08 DIAGNOSIS — Z96641 Presence of right artificial hip joint: Secondary | ICD-10-CM | POA: Diagnosis not present

## 2021-03-08 DIAGNOSIS — M8580 Other specified disorders of bone density and structure, unspecified site: Secondary | ICD-10-CM | POA: Diagnosis not present

## 2021-03-08 DIAGNOSIS — I10 Essential (primary) hypertension: Secondary | ICD-10-CM | POA: Diagnosis not present

## 2021-03-08 DIAGNOSIS — R7303 Prediabetes: Secondary | ICD-10-CM | POA: Diagnosis not present

## 2021-03-08 DIAGNOSIS — Z471 Aftercare following joint replacement surgery: Secondary | ICD-10-CM | POA: Diagnosis not present

## 2021-03-08 DIAGNOSIS — J449 Chronic obstructive pulmonary disease, unspecified: Secondary | ICD-10-CM | POA: Diagnosis not present

## 2021-03-08 DIAGNOSIS — I8390 Asymptomatic varicose veins of unspecified lower extremity: Secondary | ICD-10-CM | POA: Diagnosis not present

## 2021-03-08 DIAGNOSIS — Z6837 Body mass index (BMI) 37.0-37.9, adult: Secondary | ICD-10-CM | POA: Diagnosis not present

## 2021-03-08 DIAGNOSIS — Z7982 Long term (current) use of aspirin: Secondary | ICD-10-CM | POA: Diagnosis not present

## 2021-03-08 DIAGNOSIS — G4700 Insomnia, unspecified: Secondary | ICD-10-CM | POA: Diagnosis not present

## 2021-03-08 DIAGNOSIS — Z9981 Dependence on supplemental oxygen: Secondary | ICD-10-CM | POA: Diagnosis not present

## 2021-03-08 DIAGNOSIS — R69 Illness, unspecified: Secondary | ICD-10-CM | POA: Diagnosis not present

## 2021-03-08 NOTE — Discharge Summary (Signed)
.  Patient ID: Regina Mcmahon MRN: [id number] DOB/AGE: 25-Jun-1951 70 y.o.  Admit date: 02/22/2021 Discharge date: 02/27/2021  Admission Diagnoses:  Active Problems:   Unilateral primary osteoarthritis, right hip   Arthritis of right hip   Discharge Diagnoses:  Active Problems:   Unilateral primary osteoarthritis, right hip   Arthritis of right hip  status post Procedure(s): RIGHT TOTAL HIP ARTHROPLASTY ANTERIOR APPROACH  Past Medical History:  Diagnosis Date   Asthma    Asymptomatic varicose veins    Breast cancer ([place]) 06/2014   left   Chronic airway obstruction (HCC)    Complication of anesthesia    difficulty waking up   COPD (chronic obstructive pulmonary disease) (HCC)    Depression    GERD (gastroesophageal reflux disease)    Hemorrhage rectum    and anus.   Hypertension    Insomnia    Other symptoms involving digestive system(787.99)    Pain    Hx.pain in joint involving pelvic region and thigh; pain in limb   Palpitations    Panic attacks    Personal history of chemotherapy 2016   Personal history of radiation therapy 2016   Pre-diabetes    Sinusitis    Symptomatic menopausal or female climacteric states     Surgeries: Procedure(s): RIGHT TOTAL HIP ARTHROPLASTY ANTERIOR APPROACH on 02/22/2021   Consultants:   Discharged Condition: Improved  Hospital Course: Regina Mcmahon is an 70 y.o. female who was admitted 02/22/2021 for operative treatment of right hip arthritis.  Patient failed conservative treatments (please see the history and physical for the specifics) and had severe unremitting pain that affects sleep, daily activities and work/hobbies. After pre-op clearance, the patient was taken to the operating room on 02/22/2021 and underwent  Procedure(s): RIGHT TOTAL HIP ARTHROPLASTY ANTERIOR APPROACH.    Patient was given perioperative antibiotics:  Anti-infectives (From admission, onward)    Start     Dose/Rate Route Frequency Ordered Stop   02/22/21  1045  ceFAZolin (ANCEF) IVPB 2g/100 mL premix        2 g 200 mL/hr over 30 Minutes Intravenous On call to O.R. 02/22/21 1043 02/22/21 1259        Patient was given sequential compression devices and early ambulation to prevent DVT.   Patient benefited maximally from hospital stay and there were no complications. At the time of discharge, the patient was urinating/moving their bowels without difficulty, tolerating a regular diet, pain is controlled with oral pain medications and they have been cleared by PT/OT.  Hospital admission was complicated by postop pulmonary edema.  Patient followed by pulmonology.   Recent vital signs: No data found.   Recent laboratory studies: No results for input(s): WBC, HGB, HCT, PLT, NA, K, CL, CO2, BUN, CREATININE, GLUCOSE, INR, CALCIUM in the last 72 hours.  Invalid input(s): PT, 2   Discharge Medications:   Allergies as of 02/27/2021   No Known Allergies      Medication List     STOP taking these medications    azithromycin 250 MG tablet Commonly known as: ZITHROMAX   ibuprofen 800 MG tablet Commonly known as: ADVIL       TAKE these medications    albuterol 108 (90 Base) MCG/ACT inhaler Commonly known as: VENTOLIN HFA Inhale 2 puffs into the lungs every 6 (six) hours as needed for wheezing or shortness of breath.   ALPRAZolam 0.5 MG tablet Commonly known as: XANAX Take 0.5 mg by mouth 3 (three) times daily as needed for  anxiety.   Anoro Ellipta 62.5-25 MCG/INH Aepb Generic drug: umeclidinium-vilanterol Inhale 1 puff into the lungs daily as needed (SOB).   aspirin 325 MG EC tablet Take 1 tablet (325 mg total) by mouth daily with breakfast. Must take at least 4 weeks postop for DVT prophylaxis   calcium carbonate 500 MG chewable tablet Commonly known as: TUMS - dosed in mg elemental calcium Chew 1 tablet by mouth 3 (three) times daily as needed for indigestion or heartburn. Reported on 12/07/2015   furosemide 20 MG  tablet Commonly known as: LASIX Take 20 mg by mouth daily.   gabapentin 300 MG capsule Commonly known as: NEURONTIN Take 1 capsule (300 mg total) by mouth 3 (three) times daily. What changed: when to take this   hydrochlorothiazide 12.5 MG tablet Commonly known as: HYDRODIURIL Take 12.5 mg by mouth daily.   HYDROcodone-acetaminophen 5-325 MG tablet Commonly known as: NORCO/VICODIN Take 1 tablet by mouth every 6 (six) hours as needed for severe pain.   pantoprazole 40 MG tablet Commonly known as: PROTONIX Take 1 tablet by mouth once daily   potassium citrate 10 MEQ (1080 MG) SR tablet Commonly known as: UROCIT-K Take 10 mEq by mouth daily.   sertraline 100 MG tablet Commonly known as: ZOLOFT Take 100 mg by mouth daily.        Diagnostic Studies: DG Chest 2 View  Result Date: 02/23/2021 CLINICAL DATA:  Cough for several days.  Asthma. EXAM: CHEST - 2 VIEW COMPARISON:  09/17/2014 FINDINGS: Heart size remains within normal limits. Low lung volumes are again seen. Pulmonary interstitial prominence is stable. New opacity is seen in the left retrocardiac lung base, which may be due to atelectasis or infiltrate. No evidence of pleural effusion. IMPRESSION: Left retrocardiac atelectasis versus infiltrate. Electronically Signed   By: Marlaine Hind M.D.   On: 02/23/2021 16:32   DG CHEST PORT 1 VIEW  Result Date: 02/26/2021 EXAM: PORTABLE CHEST 1 VIEW COMPARISON:  02/23/2021. FINDINGS: Low lung volumes. Similar medial left basilar opacity. Mild interstitial prominence. No visible pleural effusions or pneumothorax. Cardiomediastinal silhouette is within normal limits. No acute osseous abnormality. IMPRESSION: 1. Similar medial left basilar opacity, which could represent atelectasis or pneumonia. 2. Mild interstitial prominence, possibly representing mild interstitial edema although low lung volumes and portable technique limit evaluation. Electronically Signed   By: Margaretha Sheffield MD   On:  02/26/2021 13:55   DG C-Arm 1-60 Min  Result Date: 02/22/2021 CLINICAL DATA:  Surgery, elective Z41.9 (ICD-10-CM). Additional history provided by technologist: Right total hip arthroplasty anterior approach. Provided fluoroscopy time 32 seconds (5.885 mGy). EXAM: OPERATIVE right HIP (WITH PELVIS IF PERFORMED) 2 VIEWS TECHNIQUE: Fluoroscopic spot image(s) were submitted for interpretation post-operatively. COMPARISON:  Radiographs of the right hip 12/20/2018. FINDINGS: Two intraoperative fluoroscopic images of the right hip are submitted. On the provided images, there are findings of interval right total hip arthroplasty. The femoral and acetabular components appear well seated. No unexpected finding. IMPRESSION: Two intraoperative fluoroscopic images of the right hip from right total hip arthroplasty. Electronically Signed   By: Kellie Simmering DO   On: 02/22/2021 15:05   DG Hip Port Unilat With Pelvis 1V Right  Result Date: 02/22/2021 CLINICAL DATA:  Status post right hip total arthroplasty EXAM: DG HIP (WITH OR WITHOUT PELVIS) 1V PORT RIGHT COMPARISON:  None. FINDINGS: Status post right hip total arthroplasty with expected overlying postoperative changes. No evidence of perihardware fracture or component malpositioning. IMPRESSION: Status post right hip total arthroplasty with  expected overlying postoperative changes. No evidence of perihardware fracture or component malpositioning. Electronically Signed   By: Eddie Candle M.D.   On: 02/22/2021 16:23   DG HIP OPERATIVE UNILAT WITH PELVIS RIGHT  Result Date: 02/22/2021 CLINICAL DATA:  Surgery, elective Z41.9 (ICD-10-CM). Additional history provided by technologist: Right total hip arthroplasty anterior approach. Provided fluoroscopy time 32 seconds (5.885 mGy). EXAM: OPERATIVE right HIP (WITH PELVIS IF PERFORMED) 2 VIEWS TECHNIQUE: Fluoroscopic spot image(s) were submitted for interpretation post-operatively. COMPARISON:  Radiographs of the right hip  12/20/2018. FINDINGS: Two intraoperative fluoroscopic images of the right hip are submitted. On the provided images, there are findings of interval right total hip arthroplasty. The femoral and acetabular components appear well seated. No unexpected finding. IMPRESSION: Two intraoperative fluoroscopic images of the right hip from right total hip arthroplasty. Electronically Signed   By: Kellie Simmering DO   On: 02/22/2021 15:05   VAS [place] LOWER EXTREMITY VENOUS (DVT)  Result Date: 02/26/2021  Lower Venous DVT Study Patient Name:  Regina Mcmahon  Date of Exam:   02/26/2021 Medical Rec #: [id number]        Accession #:    [id number] Date of Birth: 10-03-1950         Patient Gender: F Patient Age:   23 years Exam Location:  [facility] Procedure:      VAS [place] LOWER EXTREMITY VENOUS (DVT) Referring Phys: Benjiman Core --------------------------------------------------------------------------------  Indications: Swelling, Pain, and Post-op.  Limitations: Pain, patient position. Comparison Study: No prior study Performing Technologist: Maudry Mayhew MHA, RDMS, RVT, RDCS  Examination Guidelines: A complete evaluation includes B-mode imaging, spectral Doppler, color Doppler, and power Doppler as needed of all accessible portions of each vessel. Bilateral testing is considered an integral part of a complete examination. Limited examinations for reoccurring indications may be performed as noted. The reflux portion of the exam is performed with the patient in reverse Trendelenburg.  +---------+---------------+---------+-----------+----------+--------------+ RIGHT    CompressibilityPhasicitySpontaneityPropertiesThrombus Aging +---------+---------------+---------+-----------+----------+--------------+ CFV      Full           Yes      Yes                                 +---------+---------------+---------+-----------+----------+--------------+ SFJ      Full                                                         +---------+---------------+---------+-----------+----------+--------------+ FV Prox  Full                                                        +---------+---------------+---------+-----------+----------+--------------+ FV Mid   Full                                                        +---------+---------------+---------+-----------+----------+--------------+ FV DistalFull                                                        +---------+---------------+---------+-----------+----------+--------------+  PFV      Full                                                        +---------+---------------+---------+-----------+----------+--------------+ POP      Full           Yes      Yes                                 +---------+---------------+---------+-----------+----------+--------------+ PTV      Full                                                        +---------+---------------+---------+-----------+----------+--------------+ PERO     Full                                                        +---------+---------------+---------+-----------+----------+--------------+   Left Technical Findings: Not visualized segments include CFV.   Summary: RIGHT: - There is no evidence of deep vein thrombosis in the lower extremity.  - No cystic structure found in the popliteal fossa.   *See table(s) above for measurements and observations. Electronically signed by Jamelle Haring on 02/26/2021 at 5:26:44 PM.    Final     Discharge Instructions     Call MD / Call 911   Complete by: As directed    If you experience chest pain or shortness of breath, CALL 911 and be transported to the hospital emergency room.  If you develope a fever above 101 F, pus (white drainage) or increased drainage or redness at the wound, or calf pain, call your surgeon's office.   Constipation Prevention   Complete by: As directed    Drink plenty of fluids.  Prune juice may be helpful.  You  may use a stool softener, such as Colace (over the counter) 100 mg twice a day.  Use MiraLax (over the counter) for constipation as needed.   Diet - low sodium heart healthy   Complete by: As directed    Increase activity slowly as tolerated   Complete by: As directed    Post-operative opioid taper instructions:   Complete by: As directed    POST-OPERATIVE OPIOID TAPER INSTRUCTIONS: It is important to wean off of your opioid medication as soon as possible. If you do not need pain medication after your surgery it is ok to stop day one. Opioids include: Codeine, Hydrocodone(Norco, Vicodin), Oxycodone(Percocet, oxycontin) and hydromorphone amongst others.  Long term and even short term use of opiods can cause: Increased pain response Dependence Constipation Depression Respiratory depression And more.  Withdrawal symptoms can include Flu like symptoms Nausea, vomiting And more Techniques to manage these symptoms Hydrate well Eat regular healthy meals Stay active Use relaxation techniques(deep breathing, meditating, yoga) Do Not substitute Alcohol to help with tapering If you have been on opioids for less than two weeks and do not have pain than it is ok to stop  all together.  Plan to wean off of opioids This plan should start within one week post op of your joint replacement. Maintain the same interval or time between taking each dose and first decrease the dose.  Cut the total daily intake of opioids by one tablet each day Next start to increase the time between doses. The last dose that should be eliminated is the evening dose.           Follow-up Information     Marybelle Killings, MD. Schedule an appointment as soon as possible for a visit today.   Specialty: Orthopedic Surgery Why: need return office visit 2 weeks postop.  call to schedule appointment. Contact information: [place] [address] [phone]         [facility] Follow up.    Specialty: [place] Why: office to follow up within 48 hours of discharge Contact information: [address] [place] [address] [phone]                 Discharge Plan:  discharge to home  Disposition:     Signed: Benjiman Core  03/08/2021, 3:18 PM

## 2021-03-09 ENCOUNTER — Other Ambulatory Visit: Payer: Self-pay | Admitting: Orthopedic Surgery

## 2021-03-09 DIAGNOSIS — Z7982 Long term (current) use of aspirin: Secondary | ICD-10-CM | POA: Diagnosis not present

## 2021-03-09 DIAGNOSIS — Z96641 Presence of right artificial hip joint: Secondary | ICD-10-CM | POA: Diagnosis not present

## 2021-03-09 DIAGNOSIS — M8580 Other specified disorders of bone density and structure, unspecified site: Secondary | ICD-10-CM | POA: Diagnosis not present

## 2021-03-09 DIAGNOSIS — K219 Gastro-esophageal reflux disease without esophagitis: Secondary | ICD-10-CM | POA: Diagnosis not present

## 2021-03-09 DIAGNOSIS — I10 Essential (primary) hypertension: Secondary | ICD-10-CM | POA: Diagnosis not present

## 2021-03-09 DIAGNOSIS — G4700 Insomnia, unspecified: Secondary | ICD-10-CM | POA: Diagnosis not present

## 2021-03-09 DIAGNOSIS — Z471 Aftercare following joint replacement surgery: Secondary | ICD-10-CM | POA: Diagnosis not present

## 2021-03-09 DIAGNOSIS — R69 Illness, unspecified: Secondary | ICD-10-CM | POA: Diagnosis not present

## 2021-03-09 DIAGNOSIS — Z853 Personal history of malignant neoplasm of breast: Secondary | ICD-10-CM | POA: Diagnosis not present

## 2021-03-09 DIAGNOSIS — I8390 Asymptomatic varicose veins of unspecified lower extremity: Secondary | ICD-10-CM | POA: Diagnosis not present

## 2021-03-09 DIAGNOSIS — R7303 Prediabetes: Secondary | ICD-10-CM | POA: Diagnosis not present

## 2021-03-09 DIAGNOSIS — Z9981 Dependence on supplemental oxygen: Secondary | ICD-10-CM | POA: Diagnosis not present

## 2021-03-09 DIAGNOSIS — M5136 Other intervertebral disc degeneration, lumbar region: Secondary | ICD-10-CM

## 2021-03-09 DIAGNOSIS — J449 Chronic obstructive pulmonary disease, unspecified: Secondary | ICD-10-CM | POA: Diagnosis not present

## 2021-03-09 DIAGNOSIS — Z6837 Body mass index (BMI) 37.0-37.9, adult: Secondary | ICD-10-CM | POA: Diagnosis not present

## 2021-03-09 DIAGNOSIS — Z7951 Long term (current) use of inhaled steroids: Secondary | ICD-10-CM | POA: Diagnosis not present

## 2021-03-10 ENCOUNTER — Other Ambulatory Visit: Payer: Self-pay

## 2021-03-10 ENCOUNTER — Ambulatory Visit (INDEPENDENT_AMBULATORY_CARE_PROVIDER_SITE_OTHER): Payer: Medicare HMO | Admitting: Orthopaedic Surgery

## 2021-03-10 ENCOUNTER — Ambulatory Visit: Payer: Self-pay

## 2021-03-10 ENCOUNTER — Encounter: Payer: Self-pay | Admitting: Orthopaedic Surgery

## 2021-03-10 VITALS — BP 119/75 | HR 68 | Ht 63.5 in | Wt 217.0 lb

## 2021-03-10 DIAGNOSIS — Z96641 Presence of right artificial hip joint: Secondary | ICD-10-CM

## 2021-03-10 MED ORDER — TRAMADOL HCL 50 MG PO TABS: 50.0000 mg | ORAL_TABLET | Freq: Four times a day (QID) | ORAL | 0 refills | Status: DC | PRN

## 2021-03-10 NOTE — Progress Notes (Signed)
Post-Op Visit Note   Patient: Regina Mcmahon           Date of Birth: 1951-06-23           MRN: [id number] Visit Date: 03/10/2021 PCP: [name], MD   Assessment & Plan: Jodell Cipro are removed today.  X-rays look good.  She is amatory slowly with a walker.  Chief Complaint:  Chief Complaint  Patient presents with   Right Hip - Routine Post Op    02/22/2021 Right THA   Visit Diagnoses:  1. History of total right hip replacement     Plan:   Follow-Up Instructions: No follow-ups on file.   Orders:  Orders Placed This Encounter  Procedures   XR HIP UNILAT W OR W/O PELVIS 2-3 VIEWS RIGHT   No orders of the defined types were placed in this encounter.   Imaging: No results found.  PMFS History: Patient Active Problem List   Diagnosis Date Noted   Arthritis of right hip 02/22/2021   Unilateral primary osteoarthritis, right hip 01/21/2021   COPD GOLD II  /  still smoking  10/12/2020   Shortness of breath 09/12/2016   Well woman exam with routine gynecological exam 04/20/2016   Morbid obesity due to excess calories ([place]) 03/03/2016   Colon adenomas 03/03/2016   Rash 01/18/2016   GERD (gastroesophageal reflux disease) 10/19/2015   Osteopenia determined by x-ray 09/07/2015   High risk medication use 09/07/2015   Genetic testing 03/20/2015   Family history of ovarian cancer 03/20/2015   Family history of colon cancer 03/20/2015   Family history of cancer 03/20/2015   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   Hypertension    Insomnia    Other symptoms involving digestive system(787.99)    Pain    Hx.pain in joint involving pelvic region and thigh; pain in limb   Palpitations    Panic attacks    Personal history of chemotherapy 2016   Personal history of radiation therapy 2016   Pre-diabetes    Sinusitis    Symptomatic menopausal or female climacteric states     Family History  Problem Relation Age of Onset   Diabetes Mother    Ovarian cancer Mother 19   Other Father        died in MVA at age 49   Other Sister        mitral valve disorder   Melanoma Sister 28       removed from back of leg   Colon cancer Brother        early 45s, succumbed to disease   Cancer Paternal Aunt        leukemia, bone, or lung cancer   Alzheimer's disease Maternal Grandmother    Heart attack Maternal Grandfather    Heart attack Paternal Grandmother  COPD Paternal Grandfather    Emphysema Paternal Grandfather    Congestive Heart Failure Paternal Aunt    Stomach cancer Paternal Uncle    Kidney cancer Maternal Uncle    Cancer Cousin        dx. teens/dx. 97s   Cancer Cousin    Colon cancer Cousin     Past Surgical History:  Procedure Laterality Date   BREAST BIOPSY Left 10/2012   BREAST BIOPSY Left 07/01/2014   malignant   BREAST LUMPECTOMY Left 08/20/2014   CESAREAN SECTION     CHOLECYSTECTOMY  1985   COLONOSCOPY  2002   Dr. Lindalou Hose: internal hemorrhoids, one small rectal polyp, adenomatous path    COLONOSCOPY N/A 11/23/2015   Procedure: COLONOSCOPY;  Surgeon: Danie Binder, MD;  [facility];  Service: Endoscopy;  Laterality: N/A;  1100 - moved to 10:45 - office to notify   ESOPHAGOGASTRODUODENOSCOPY N/A 11/23/2015   Procedure: ESOPHAGOGASTRODUODENOSCOPY (EGD);  Surgeon: Danie Binder, MD;  [facility];  Service: Endoscopy;  Laterality:  N/A;   PORT-A-CATH REMOVAL  02/2015   PORTACATH PLACEMENT Right 09/17/2014   Procedure: INSERTION PORT-A-CATH WITH ULTRASOUND;  Surgeon: Erroll Luna, MD;  Location: [place];  Service: General;  Laterality: Right;  IJ   RADIOACTIVE SEED GUIDED PARTIAL MASTECTOMY WITH AXILLARY SENTINEL LYMPH NODE BIOPSY Left 08/20/2014   Procedure: LEFT BREAST LUMPECTOMY WITH RADIOACTIVE SEED LOCALIZATION AND SENTINEL LYMPH NODE MAPPING;  Surgeon: Erroll Luna, MD;  Location: [place];  Service: General;  Laterality: Left;   RE-EXCISION OF BREAST LUMPECTOMY Left 09/17/2014   Procedure: RE-EXCISION OF BREAST LUMPECTOMY;  Surgeon: Erroll Luna, MD;  Location: [place];  Service: General;  Laterality: Left;   TOTAL HIP ARTHROPLASTY Right 02/22/2021   Procedure: RIGHT TOTAL HIP ARTHROPLASTY ANTERIOR APPROACH;  Surgeon: Marybelle Killings, MD;  Location: [place];  Service: Orthopedics;  Laterality: Right;   TUBAL LIGATION     Social History   Occupational History   Not on file  Tobacco Use   Smoking status: Every Day    Packs/day: 1.00    Years: 42.00    Pack years: 42.00    Types: Cigarettes   Smokeless tobacco: Never  Vaping Use   Vaping Use: Never used  Substance and Sexual Activity   Alcohol use: No    Alcohol/week: 0.0 standard drinks   Drug use: No   Sexual activity: Never    Birth control/protection: None

## 2021-03-12 DIAGNOSIS — Z9981 Dependence on supplemental oxygen: Secondary | ICD-10-CM | POA: Diagnosis not present

## 2021-03-12 DIAGNOSIS — R69 Illness, unspecified: Secondary | ICD-10-CM | POA: Diagnosis not present

## 2021-03-12 DIAGNOSIS — K219 Gastro-esophageal reflux disease without esophagitis: Secondary | ICD-10-CM | POA: Diagnosis not present

## 2021-03-12 DIAGNOSIS — Z7951 Long term (current) use of inhaled steroids: Secondary | ICD-10-CM | POA: Diagnosis not present

## 2021-03-12 DIAGNOSIS — G4700 Insomnia, unspecified: Secondary | ICD-10-CM | POA: Diagnosis not present

## 2021-03-12 DIAGNOSIS — I8390 Asymptomatic varicose veins of unspecified lower extremity: Secondary | ICD-10-CM | POA: Diagnosis not present

## 2021-03-12 DIAGNOSIS — Z6837 Body mass index (BMI) 37.0-37.9, adult: Secondary | ICD-10-CM | POA: Diagnosis not present

## 2021-03-12 DIAGNOSIS — Z853 Personal history of malignant neoplasm of breast: Secondary | ICD-10-CM | POA: Diagnosis not present

## 2021-03-12 DIAGNOSIS — R7303 Prediabetes: Secondary | ICD-10-CM | POA: Diagnosis not present

## 2021-03-12 DIAGNOSIS — M8580 Other specified disorders of bone density and structure, unspecified site: Secondary | ICD-10-CM | POA: Diagnosis not present

## 2021-03-12 DIAGNOSIS — Z96641 Presence of right artificial hip joint: Secondary | ICD-10-CM | POA: Diagnosis not present

## 2021-03-12 DIAGNOSIS — J449 Chronic obstructive pulmonary disease, unspecified: Secondary | ICD-10-CM | POA: Diagnosis not present

## 2021-03-12 DIAGNOSIS — Z471 Aftercare following joint replacement surgery: Secondary | ICD-10-CM | POA: Diagnosis not present

## 2021-03-12 DIAGNOSIS — I10 Essential (primary) hypertension: Secondary | ICD-10-CM | POA: Diagnosis not present

## 2021-03-12 DIAGNOSIS — Z7982 Long term (current) use of aspirin: Secondary | ICD-10-CM | POA: Diagnosis not present

## 2021-03-13 DIAGNOSIS — Z7982 Long term (current) use of aspirin: Secondary | ICD-10-CM | POA: Diagnosis not present

## 2021-03-13 DIAGNOSIS — Z471 Aftercare following joint replacement surgery: Secondary | ICD-10-CM | POA: Diagnosis not present

## 2021-03-13 DIAGNOSIS — J449 Chronic obstructive pulmonary disease, unspecified: Secondary | ICD-10-CM | POA: Diagnosis not present

## 2021-03-13 DIAGNOSIS — Z853 Personal history of malignant neoplasm of breast: Secondary | ICD-10-CM | POA: Diagnosis not present

## 2021-03-13 DIAGNOSIS — Z96641 Presence of right artificial hip joint: Secondary | ICD-10-CM | POA: Diagnosis not present

## 2021-03-13 DIAGNOSIS — R7303 Prediabetes: Secondary | ICD-10-CM | POA: Diagnosis not present

## 2021-03-13 DIAGNOSIS — R69 Illness, unspecified: Secondary | ICD-10-CM | POA: Diagnosis not present

## 2021-03-13 DIAGNOSIS — G4700 Insomnia, unspecified: Secondary | ICD-10-CM | POA: Diagnosis not present

## 2021-03-13 DIAGNOSIS — I10 Essential (primary) hypertension: Secondary | ICD-10-CM | POA: Diagnosis not present

## 2021-03-13 DIAGNOSIS — Z6837 Body mass index (BMI) 37.0-37.9, adult: Secondary | ICD-10-CM | POA: Diagnosis not present

## 2021-03-13 DIAGNOSIS — K219 Gastro-esophageal reflux disease without esophagitis: Secondary | ICD-10-CM | POA: Diagnosis not present

## 2021-03-13 DIAGNOSIS — I8390 Asymptomatic varicose veins of unspecified lower extremity: Secondary | ICD-10-CM | POA: Diagnosis not present

## 2021-03-13 DIAGNOSIS — M8580 Other specified disorders of bone density and structure, unspecified site: Secondary | ICD-10-CM | POA: Diagnosis not present

## 2021-03-13 DIAGNOSIS — Z9981 Dependence on supplemental oxygen: Secondary | ICD-10-CM | POA: Diagnosis not present

## 2021-03-13 DIAGNOSIS — Z7951 Long term (current) use of inhaled steroids: Secondary | ICD-10-CM | POA: Diagnosis not present

## 2021-03-16 ENCOUNTER — Other Ambulatory Visit: Payer: Self-pay | Admitting: Orthopaedic Surgery

## 2021-03-16 DIAGNOSIS — Z96641 Presence of right artificial hip joint: Secondary | ICD-10-CM | POA: Diagnosis not present

## 2021-03-16 DIAGNOSIS — Z7982 Long term (current) use of aspirin: Secondary | ICD-10-CM | POA: Diagnosis not present

## 2021-03-16 DIAGNOSIS — Z9981 Dependence on supplemental oxygen: Secondary | ICD-10-CM | POA: Diagnosis not present

## 2021-03-16 DIAGNOSIS — Z471 Aftercare following joint replacement surgery: Secondary | ICD-10-CM | POA: Diagnosis not present

## 2021-03-16 DIAGNOSIS — K219 Gastro-esophageal reflux disease without esophagitis: Secondary | ICD-10-CM | POA: Diagnosis not present

## 2021-03-16 DIAGNOSIS — M8580 Other specified disorders of bone density and structure, unspecified site: Secondary | ICD-10-CM | POA: Diagnosis not present

## 2021-03-16 DIAGNOSIS — R69 Illness, unspecified: Secondary | ICD-10-CM | POA: Diagnosis not present

## 2021-03-16 DIAGNOSIS — G4700 Insomnia, unspecified: Secondary | ICD-10-CM | POA: Diagnosis not present

## 2021-03-16 DIAGNOSIS — Z6837 Body mass index (BMI) 37.0-37.9, adult: Secondary | ICD-10-CM | POA: Diagnosis not present

## 2021-03-16 DIAGNOSIS — Z853 Personal history of malignant neoplasm of breast: Secondary | ICD-10-CM | POA: Diagnosis not present

## 2021-03-16 DIAGNOSIS — I8390 Asymptomatic varicose veins of unspecified lower extremity: Secondary | ICD-10-CM | POA: Diagnosis not present

## 2021-03-16 DIAGNOSIS — R7303 Prediabetes: Secondary | ICD-10-CM | POA: Diagnosis not present

## 2021-03-16 DIAGNOSIS — Z7951 Long term (current) use of inhaled steroids: Secondary | ICD-10-CM | POA: Diagnosis not present

## 2021-03-16 DIAGNOSIS — I10 Essential (primary) hypertension: Secondary | ICD-10-CM | POA: Diagnosis not present

## 2021-03-16 DIAGNOSIS — J449 Chronic obstructive pulmonary disease, unspecified: Secondary | ICD-10-CM | POA: Diagnosis not present

## 2021-03-16 NOTE — Telephone Encounter (Signed)
Please advise 

## 2021-03-17 ENCOUNTER — Telehealth: Payer: Self-pay | Admitting: Orthopaedic Surgery

## 2021-03-17 NOTE — Telephone Encounter (Signed)
I spoke with [facility] Side. He states that they have orders to see patient until middle of next week for HHPT. The therapist that is treating her at home states that the patient does not want to attend outpatient PT due to not having her COVID injections. If you wish to extend HHPT, we will need verbal orders to do that provided insurance approves. Otherwise, if you wish for patient to have outpatient PT, we will need to call her and advise.  If continued HHPT, can send S[facility] Side an email with orders.  Please advise.

## 2021-03-17 NOTE — Telephone Encounter (Signed)
[facility] called. Says they have been working with patient in home for PT. She says she does not want to do outpatient PT. Sonia Side would like a call back,. [phone]

## 2021-03-17 NOTE — Telephone Encounter (Signed)
I sent Sonia Side email advising.

## 2021-03-23 ENCOUNTER — Telehealth: Payer: Self-pay | Admitting: Orthopaedic Surgery

## 2021-03-23 NOTE — Telephone Encounter (Signed)
Pt called requesting a call back. Pt states she had surgery on 8/1 and her incision is oozing, she is running an 101 fever, and feeling congested and need medical advice. Please call pt at [phone].

## 2021-03-23 NOTE — Telephone Encounter (Signed)
Please advise 

## 2021-03-24 ENCOUNTER — Ambulatory Visit: Payer: Medicare HMO | Admitting: Orthopaedic Surgery

## 2021-03-24 NOTE — Telephone Encounter (Signed)
No she said she didn't have a ride for today.

## 2021-03-24 NOTE — Telephone Encounter (Signed)
Lvm for pt to cb to schedule

## 2021-03-24 NOTE — Telephone Encounter (Signed)
Ok thank you 

## 2021-03-24 NOTE — Telephone Encounter (Signed)
Could pt not come in today?

## 2021-03-24 NOTE — Telephone Encounter (Signed)
FYI pt unable to come in today due to lack of ride. She is scheduled to see [facility] tomorrow

## 2021-03-25 ENCOUNTER — Other Ambulatory Visit: Payer: Self-pay

## 2021-03-25 ENCOUNTER — Other Ambulatory Visit: Payer: Self-pay | Admitting: Orthopaedic Surgery

## 2021-03-25 ENCOUNTER — Ambulatory Visit (INDEPENDENT_AMBULATORY_CARE_PROVIDER_SITE_OTHER): Payer: Medicare HMO | Admitting: Surgery

## 2021-03-25 ENCOUNTER — Encounter: Payer: Self-pay | Admitting: Surgery

## 2021-03-25 VITALS — BP 122/63 | HR 56 | Ht 63.5 in | Wt 217.0 lb

## 2021-03-25 DIAGNOSIS — B372 Candidiasis of skin and nail: Secondary | ICD-10-CM

## 2021-03-25 DIAGNOSIS — Z96641 Presence of right artificial hip joint: Secondary | ICD-10-CM

## 2021-03-25 MED ORDER — CEPHALEXIN 500 MG PO CAPS: 500.0000 mg | ORAL_CAPSULE | Freq: Four times a day (QID) | ORAL | 0 refills | Status: DC

## 2021-03-25 MED ORDER — FLUCONAZOLE 150 MG PO TABS: 150.0000 mg | ORAL_TABLET | Freq: Once | ORAL | 0 refills | Status: DC

## 2021-03-25 MED ORDER — FLUCONAZOLE 150 MG PO TABS: 150.0000 mg | ORAL_TABLET | Freq: Once | ORAL | 0 refills | Status: AC

## 2021-03-31 ENCOUNTER — Encounter: Payer: Medicare HMO | Admitting: Surgery

## 2021-03-31 ENCOUNTER — Telehealth: Payer: Self-pay | Admitting: Surgery

## 2021-03-31 NOTE — Telephone Encounter (Signed)
Pt can't keep  anything on her stomach and wants someone to call her. Mouth is dry and having pain in right hip.   CB [phone]

## 2021-03-31 NOTE — Telephone Encounter (Signed)
FYI  I called and left voicemail for patient that she needs to go straight to ED per your recommendations as there is concern for infection. I called daughter, whose number is listed as contact in the chart, as well. She states that she has been her mother's caretaker since surgery, however, her mother decided to leave her house and go and stay with a friend. She thinks this may be due to her mom wanting to smoke, etc., but has not spoken with her mom since and her mom won't answer her.  Daughter will reach out to her brother to see if he can reach their mom and get her to go to the ED.

## 2021-04-01 ENCOUNTER — Telehealth: Payer: Self-pay

## 2021-04-01 NOTE — Telephone Encounter (Signed)
Patient called and left a VM on triage concerning her right hip.  Returned patient's call, but no answer.  Left a VM for patient to call us back.

## 2021-04-05 NOTE — Progress Notes (Signed)
Post-Op Visit Note   Patient: Regina Mcmahon           Date of Birth: 1951/05/05           MRN: [id number] Visit Date: 03/25/2021 PCP: [name], MD   Assessment & Plan:  Chief Complaint:  Chief Complaint  Patient presents with   Right Hip - Routine Post Op  70 year old female who is status post right total replacement February 22, 2021 returns.  She was seen by Dr. Lorin Mercy 2 weeks ago and had staples removed.  Since her visit she states that she has had some yellow and bloody drainage from the proximal aspect of her incision.  Patient is also had some itching redness and some blister formation underneath her pannus on the right abdomen and this extends to around where her proximal incision is.  Dr. Lorin Mercy is in the [facility] and he had advised her to come in for evaluation.  Patient ambulating with a walker. Visit Diagnoses:  1. Skin yeast infection   2. Status post total hip replacement, right     Plan: Did show Dr. Lorin Mercy pictures of patient's hip.  I sent in prescriptions for Keflex 500 mg every 6 hours number 40 tablets and Diflucan 150 mg tablet x1 dose.  Follow-up with me in 1 week for recheck.  Dr. Lorin Mercy did not think that he had needed to be addressed surgically after reviewing the pictures.  Patient advised.  All questions answered.  Stressed her the importance of contacting us if hip worsens.  Follow-Up Instructions: Return in about 6 days (around 03/31/2021) for WITH Chastity Noland RECHECK RIGHT HIP.   Orders:  No orders of the defined types were placed in this encounter.  Meds ordered this encounter  Medications   DISCONTD: cephALEXin (KEFLEX) 500 MG capsule    Sig: Take 1 capsule (500 mg total) by mouth 4 (four) times daily.    Dispense:  60 capsule    Refill:  0   DISCONTD: fluconazole (DIFLUCAN) 150 MG tablet    Sig: Take 1 tablet (150 mg total) by mouth once for 1 dose.    Dispense:  1 tablet    Refill:  0   fluconazole (DIFLUCAN) 150 MG tablet    Sig: Take 1 tablet (150  mg total) by mouth once for 1 dose.    Dispense:  1 tablet    Refill:  0   cephALEXin (KEFLEX) 500 MG capsule    Sig: Take 1 capsule (500 mg total) by mouth 4 (four) times daily.    Dispense:  60 capsule    Refill:  0    Imaging: No results found.  PMFS History: Patient Active Problem List   Diagnosis Date Noted   Arthritis of right hip 02/22/2021   Unilateral primary osteoarthritis, right hip 01/21/2021   COPD GOLD II  /  still smoking  10/12/2020   Shortness of breath 09/12/2016   Well woman exam with routine gynecological exam 04/20/2016   Morbid obesity due to excess calories ([place]) 03/03/2016   Colon adenomas 03/03/2016   Rash 01/18/2016   GERD (gastroesophageal reflux disease) 10/19/2015   Osteopenia determined by x-ray 09/07/2015   High risk medication use 09/07/2015   Genetic testing 03/20/2015   Family history of ovarian cancer 03/20/2015   Family history of colon cancer 03/20/2015   Family history of cancer 03/20/2015   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   Hypertension    Insomnia    Other symptoms involving digestive system(787.99)    Pain    Hx.pain in joint involving pelvic region and thigh; pain in limb   Palpitations    Panic attacks    Personal history of chemotherapy 2016   Personal history of radiation therapy 2016   Pre-diabetes    Sinusitis    Symptomatic  menopausal or female climacteric states     Family History  Problem Relation Age of Onset   Diabetes Mother    Ovarian cancer Mother 68   Other Father        died in MVA at age 3   Other Sister        mitral valve disorder   Melanoma Sister 35       removed from back of leg   Colon cancer Brother        early 76s, succumbed to disease   Cancer Paternal Aunt        leukemia, bone, or lung cancer   Alzheimer's disease Maternal Grandmother    Heart attack Maternal Grandfather    Heart attack Paternal Grandmother    COPD Paternal Grandfather    Emphysema Paternal Grandfather    Congestive Heart Failure Paternal Aunt    Stomach cancer Paternal Uncle    Kidney cancer Maternal Uncle    Cancer Cousin        dx. teens/dx. 75s   Cancer Cousin    Colon cancer Cousin     Past Surgical History:  Procedure Laterality Date   BREAST BIOPSY Left 10/2012   BREAST BIOPSY Left 07/01/2014   malignant   BREAST LUMPECTOMY Left 08/20/2014   CESAREAN SECTION     CHOLECYSTECTOMY  1985   COLONOSCOPY  2002   Dr. Lindalou Hose: internal hemorrhoids, one small rectal polyp, adenomatous path    COLONOSCOPY N/A 11/23/2015   Procedure: COLONOSCOPY;  Surgeon: Danie Binder, MD;  [facility];  Service: Endoscopy;  Laterality: N/A;  1100 - moved to 10:45 - office to notify   ESOPHAGOGASTRODUODENOSCOPY N/A 11/23/2015   Procedure: ESOPHAGOGASTRODUODENOSCOPY (EGD);  Surgeon: Danie Binder, MD;  [facility];  Service: Endoscopy;  Laterality: N/A;   PORT-A-CATH REMOVAL  02/2015   PORTACATH PLACEMENT Right 09/17/2014   Procedure: INSERTION PORT-A-CATH WITH ULTRASOUND;  Surgeon: Erroll Luna, MD;  Location: [place];  Service: General;  Laterality: Right;  IJ   RADIOACTIVE SEED GUIDED PARTIAL MASTECTOMY WITH AXILLARY SENTINEL LYMPH NODE BIOPSY Left 08/20/2014   Procedure: LEFT BREAST LUMPECTOMY WITH RADIOACTIVE SEED LOCALIZATION AND SENTINEL LYMPH NODE MAPPING;  Surgeon: Erroll Luna, MD;  Location: [place];  Service: General;  Laterality: Left;   RE-EXCISION OF BREAST LUMPECTOMY Left 09/17/2014   Procedure: RE-EXCISION OF BREAST LUMPECTOMY;  Surgeon: Erroll Luna, MD;  Location: [place];  Service: General;  Laterality:  Left;   TOTAL HIP ARTHROPLASTY Right 02/22/2021   Procedure: RIGHT TOTAL HIP ARTHROPLASTY ANTERIOR APPROACH;  Surgeon: Marybelle Killings, MD;  Location: [place];  Service: Orthopedics;  Laterality: Right;   TUBAL LIGATION     Social History   Occupational History   Not on file  Tobacco Use   Smoking status: Every Day    Packs/day: 1.00    Years: 42.00    Pack years: 42.00    Types: Cigarettes   Smokeless tobacco: Never  Vaping Use   Vaping Use: Never used  Substance and Sexual Activity   Alcohol use: No    Alcohol/week: 0.0 standard drinks   Drug use: No   Sexual activity: Never    Birth control/protection: None   Exam Patient is a large area of redness and some blister formation underneath the right lower abdomen underneath her pannus and this extends to the anterior hip.  Proximal portion of her incision is affected by this with slight oozing.  No purulent drainage.

## 2021-04-05 NOTE — Telephone Encounter (Signed)
noted 

## 2021-04-15 ENCOUNTER — Encounter: Payer: Self-pay | Admitting: Orthopaedic Surgery

## 2021-04-15 ENCOUNTER — Other Ambulatory Visit: Payer: Self-pay

## 2021-04-15 ENCOUNTER — Ambulatory Visit (INDEPENDENT_AMBULATORY_CARE_PROVIDER_SITE_OTHER): Payer: Medicare HMO | Admitting: Orthopaedic Surgery

## 2021-04-15 DIAGNOSIS — Z96641 Presence of right artificial hip joint: Secondary | ICD-10-CM

## 2021-04-15 NOTE — Progress Notes (Signed)
Post-Op Visit Note   Patient: Regina Mcmahon           Date of Birth: [birth year]           MRN: [id number] Visit Date: 04/15/2021 PCP: [name], MD   Assessment & Plan: Post right total hip arthroplasty.  She is use some tramadol.  Patient is happy the results of therapy.  She is moving much better not using a cane.  Still has mild Trendelenburg gait.  We discussed doing some abduction exercises on her side  Chief Complaint:  Chief Complaint  Patient presents with   Right Hip - Follow-up    02/22/2021 Right THA   Visit Diagnoses:  1. History of total right hip arthroplasty     Plan: Continue walking.  I congratulated her on the 30 pounds that she is lost.  She is more mobile and I think if she continues to do the abduction strengthening and walk more in the community she should continue to improve.  She is happy with the results of surgery and I will check her back again on an as-needed basis.  Follow-Up Instructions: No follow-ups on file.   Orders:  No orders of the defined types were placed in this encounter.  No orders of the defined types were placed in this encounter.   Imaging: No results found.  PMFS History: Patient Active Problem List   Diagnosis Date Noted   History of total right hip arthroplasty 04/15/2021   COPD GOLD II  /  still smoking  10/12/2020   Shortness of breath 09/12/2016   Well woman exam with routine gynecological exam 04/20/2016   Morbid obesity due to excess calories ([place]) 03/03/2016   Colon adenomas 03/03/2016   Rash 01/18/2016   GERD (gastroesophageal reflux disease) 10/19/2015   Osteopenia determined by x-ray 09/07/2015   High risk medication use 09/07/2015   Genetic testing 03/20/2015   Family history of ovarian cancer 03/20/2015   Family history of colon cancer 03/20/2015   Family history of cancer 03/20/2015   Carcinoma of upper-outer quadrant of left female breast ([place]) 09/03/2014   Anxiety 10/29/2012   Blood in stool  10/29/2012   Pain 10/29/2012   Chronic airway obstruction ([place]) 10/29/2012   Diarrhea 10/29/2012   Cigarette smoker 10/29/2012   Essential hypertension 10/29/2012   Past Medical History:  Diagnosis Date   Asthma    Asymptomatic varicose veins    Breast cancer ([place]) 06/2014   left   Chronic airway obstruction (HCC)    Complication of anesthesia    difficulty waking up   COPD (chronic obstructive pulmonary disease) (HCC)    Depression    GERD (gastroesophageal reflux disease)    Hemorrhage rectum    and anus.   Hypertension    Insomnia    Other symptoms involving digestive system(787.99)    Pain    Hx.pain in joint involving pelvic region and thigh; pain in limb   Palpitations    Panic attacks    Personal history of chemotherapy 2016   Personal history of radiation therapy 2016   Pre-diabetes    Sinusitis    Symptomatic menopausal or female climacteric states     Family History  Problem Relation Age of Onset   Diabetes Mother    Ovarian cancer Mother 31   Other Father        died in MVA at age 70   Other Sister        mitral valve disorder  Melanoma Sister 75       removed from back of leg   Colon cancer Brother        early 24s, succumbed to disease   Cancer Paternal Aunt        leukemia, bone, or lung cancer   Alzheimer's disease Maternal Grandmother    Heart attack Maternal Grandfather    Heart attack Paternal Grandmother    COPD Paternal Grandfather    Emphysema Paternal Grandfather    Congestive Heart Failure Paternal Aunt    Stomach cancer Paternal Uncle    Kidney cancer Maternal Uncle    Cancer Cousin        dx. teens/dx. 58s   Cancer Cousin    Colon cancer Cousin     Past Surgical History:  Procedure Laterality Date   BREAST BIOPSY Left 10/2012   BREAST BIOPSY Left 07/01/2014   malignant   BREAST LUMPECTOMY Left 08/20/2014   CESAREAN SECTION     CHOLECYSTECTOMY  1985   COLONOSCOPY  2002   Dr. Lindalou Hose: internal hemorrhoids, one small  rectal polyp, adenomatous path    COLONOSCOPY N/A 11/23/2015   Procedure: COLONOSCOPY;  Surgeon: Danie Binder, MD;  [facility];  Service: Endoscopy;  Laterality: N/A;  1100 - moved to 10:45 - office to notify   ESOPHAGOGASTRODUODENOSCOPY N/A 11/23/2015   Procedure: ESOPHAGOGASTRODUODENOSCOPY (EGD);  Surgeon: Danie Binder, MD;  [facility];  Service: Endoscopy;  Laterality: N/A;   PORT-A-CATH REMOVAL  02/2015   PORTACATH PLACEMENT Right 09/17/2014   Procedure: INSERTION PORT-A-CATH WITH ULTRASOUND;  Surgeon: Erroll Luna, MD;  Location: [place];  Service: General;  Laterality: Right;  IJ   RADIOACTIVE SEED GUIDED PARTIAL MASTECTOMY WITH AXILLARY SENTINEL LYMPH NODE BIOPSY Left 08/20/2014   Procedure: LEFT BREAST LUMPECTOMY WITH RADIOACTIVE SEED LOCALIZATION AND SENTINEL LYMPH NODE MAPPING;  Surgeon: Erroll Luna, MD;  Location: [place];  Service: General;  Laterality: Left;   RE-EXCISION OF BREAST LUMPECTOMY Left 09/17/2014   Procedure: RE-EXCISION OF BREAST LUMPECTOMY;  Surgeon: Erroll Luna, MD;  Location: [place];  Service: General;  Laterality: Left;   TOTAL HIP ARTHROPLASTY Right 02/22/2021   Procedure: RIGHT TOTAL HIP ARTHROPLASTY ANTERIOR APPROACH;  Surgeon: Marybelle Killings, MD;  Location: [place];  Service: Orthopedics;  Laterality: Right;   TUBAL LIGATION     Social History   Occupational History   Not on file  Tobacco Use   Smoking status: Every Day    Packs/day: 1.00    Years: 42.00    Pack years: 42.00    Types: Cigarettes   Smokeless tobacco: Never  Vaping Use   Vaping Use: Never used  Substance and Sexual Activity   Alcohol use: No    Alcohol/week: 0.0 standard drinks   Drug use: No   Sexual activity: Never    Birth control/protection: None

## 2021-05-28 ENCOUNTER — Other Ambulatory Visit: Payer: Self-pay | Admitting: Family Medicine

## 2021-05-28 ENCOUNTER — Other Ambulatory Visit (HOSPITAL_COMMUNITY): Payer: Self-pay | Admitting: Family Medicine

## 2021-05-28 DIAGNOSIS — R490 Dysphonia: Secondary | ICD-10-CM

## 2021-05-29 ENCOUNTER — Other Ambulatory Visit: Payer: Self-pay | Admitting: Nurse Practitioner

## 2021-06-03 ENCOUNTER — Other Ambulatory Visit: Payer: Self-pay

## 2021-06-03 ENCOUNTER — Ambulatory Visit (HOSPITAL_COMMUNITY)
Admission: RE | Admit: 2021-06-03 | Discharge: 2021-06-03 | Disposition: A | Discharge status: Home / Self Care | Payer: Medicare HMO | Source: Ambulatory Visit | Attending: Family Medicine | Admitting: Family Medicine

## 2021-06-03 DIAGNOSIS — R490 Dysphonia: Secondary | ICD-10-CM | POA: Diagnosis not present

## 2021-06-03 IMAGING — US US SOFT TISSUE HEAD/NECK
1 series · 13 of 25 positions shown · non-contrast
Comparison: None.

CLINICAL DATA: Dysphagia for 3 months

EXAM:
THYROID ULTRASOUND
TECHNIQUE: Ultrasound examination of the thyroid gland and adjacent soft
tissues was performed.

[Series 1: us soft tissue head & neck (non-thyroid) · 13 of 49 slices shown]
[im 1/49]
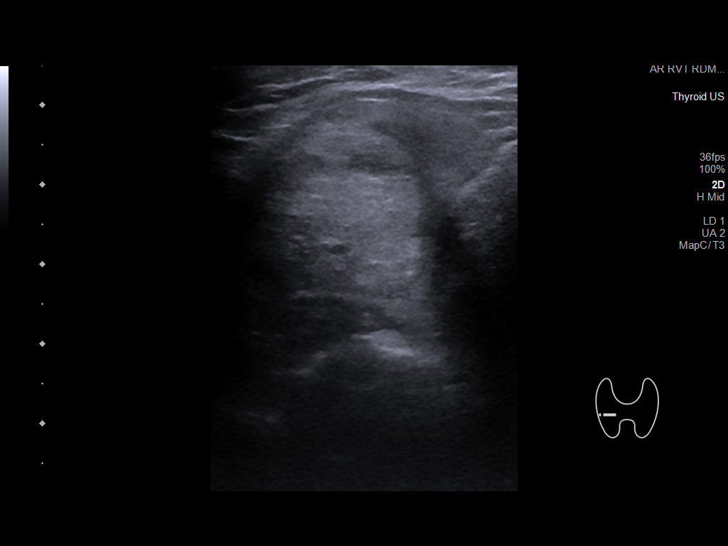
[im 5/49]
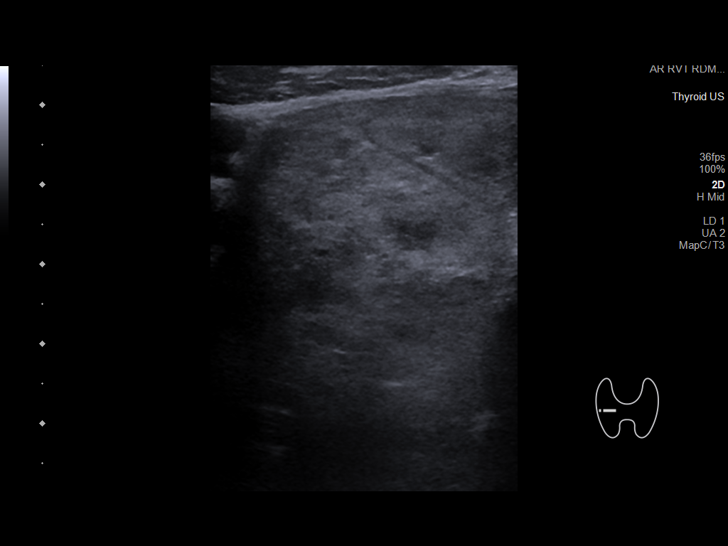
[im 9/49]
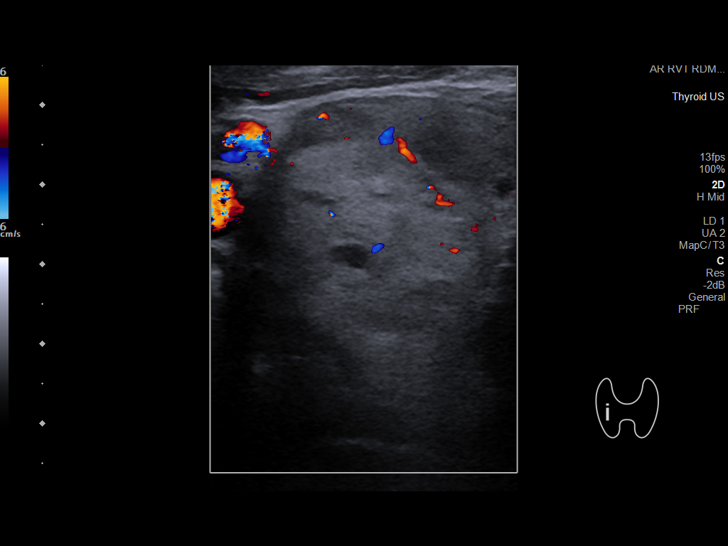
[im 13/49]
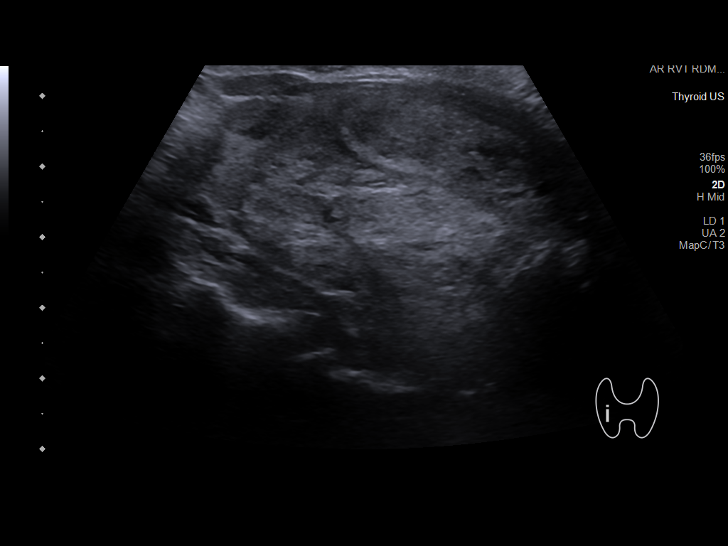
[im 17/49]
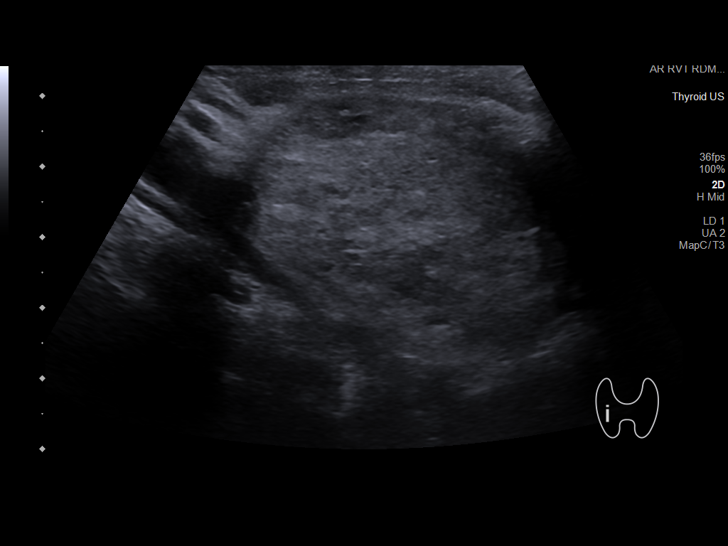
[im 21/49]
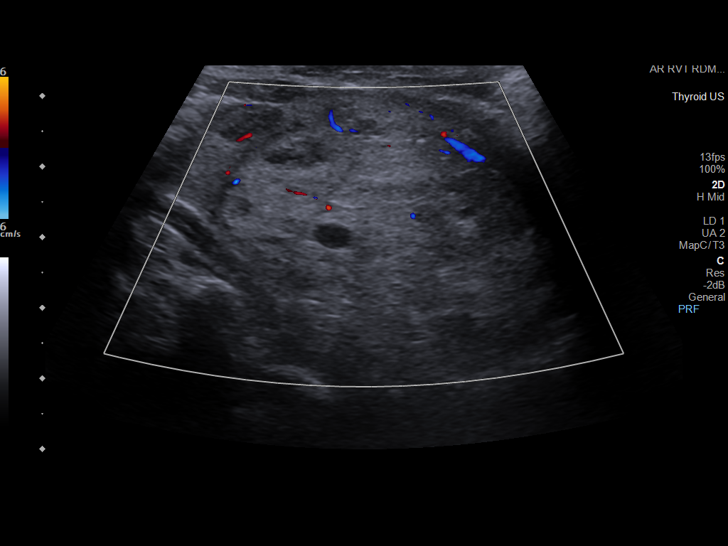
[im 25/49]
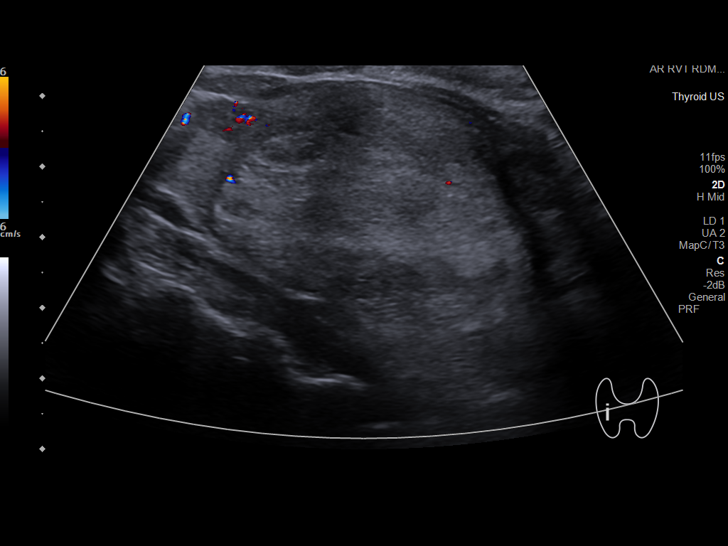
[im 29/49]
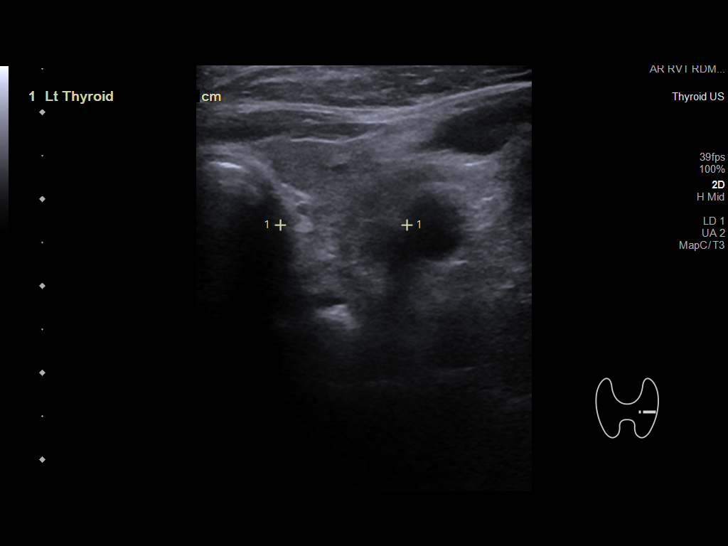
[im 33/49]
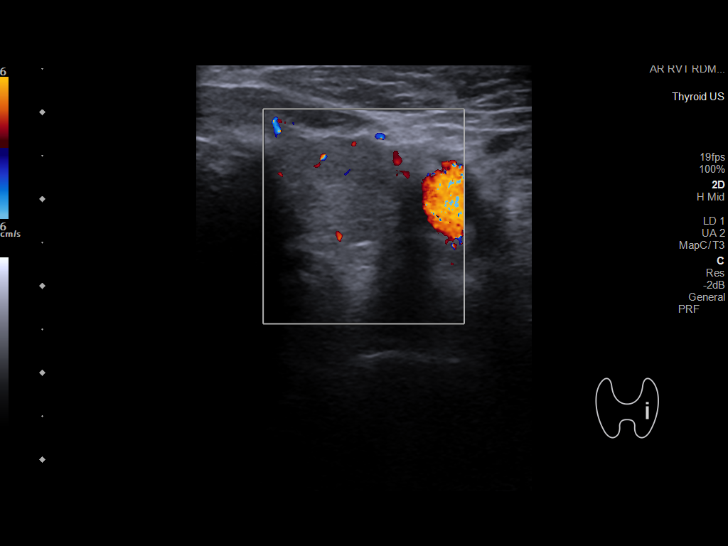
[im 37/49]
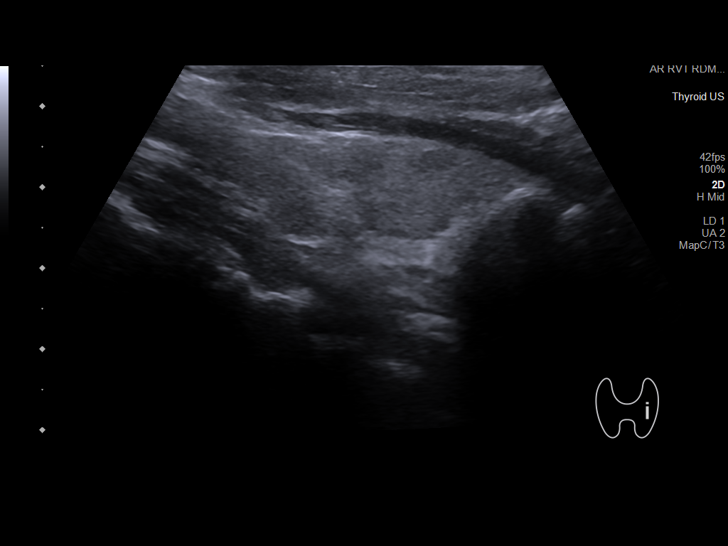
[im 41/49]
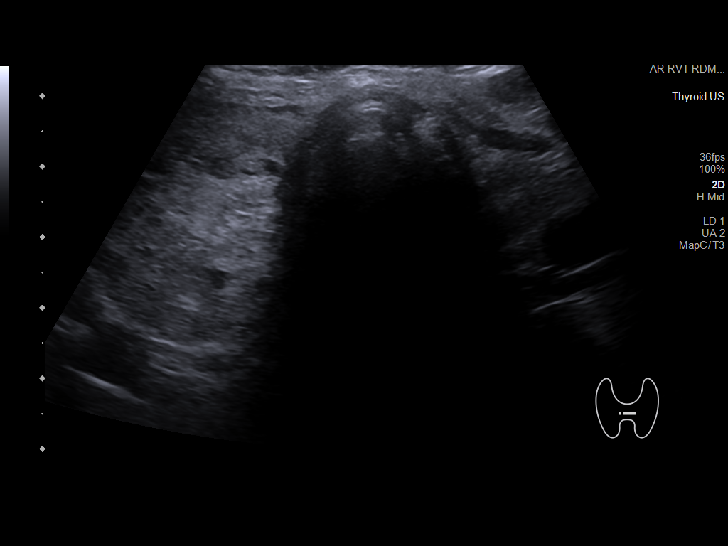
[im 45/49]
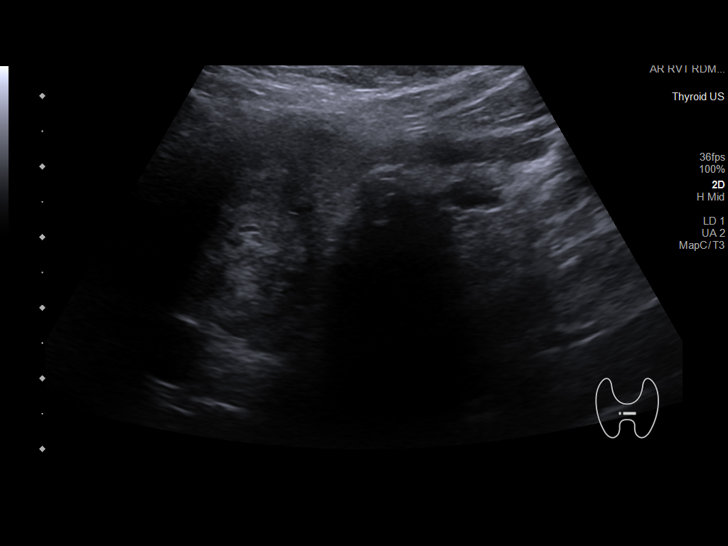
[im 49/49]
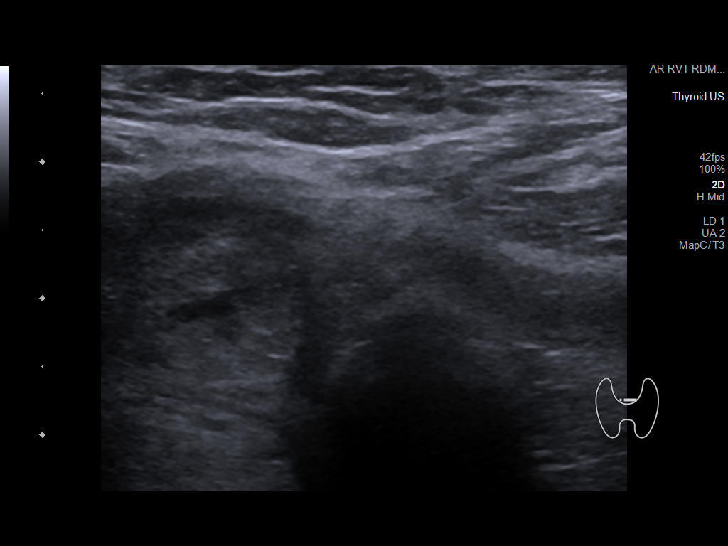

[13 of 25 positions shown; findings below may reference images not displayed]

FINDINGS: Parenchymal Echotexture: Moderately heterogeneous

Isthmus: 0.4 cm

Right lobe: 6.5 x 4.2 x 3.5 cm

Left lobe: 5.5 x 2.1 x 1.5 cm

_________________________________________________________

Estimated total number of nodules >/= 1 cm: 1

Number of spongiform nodules >/=  2 cm not described below (TR1): 0

Number of mixed cystic and solid nodules >/= 1.5 cm not described
below (TR2): 0

_________________________________________________________

Nodule # 1:

Location: Right; mid

Maximum size: 5.0 cm; Other 2 dimensions: 3.8 x 3.8 cm

Composition: solid/almost completely solid (2)

Echogenicity: isoechoic (1)

Shape: not taller-than-wide (0)

Margins: ill-defined (0)

Echogenic foci: none (0)

ACR TI-RADS total points: 3.

ACR TI-RADS risk category: TR3 (3 points).

ACR TI-RADS recommendations:

**Given size (>/= 2.5 cm) and appearance, fine needle aspiration of
this mildly suspicious nodule should be considered based on TI-RADS
criteria.

_________________________________________________________
IMPRESSION: Nodule 1 (TI-RADS 3) located in the mid right thyroid lobe should be
further evaluated with FNA.

The above is in keeping with the ACR TI-RADS recommendations - [HOSPITAL] [C2];[DATE].

## 2021-06-11 ENCOUNTER — Other Ambulatory Visit: Payer: Self-pay | Admitting: Otolaryngology

## 2021-06-11 DIAGNOSIS — C320 Malignant neoplasm of glottis: Secondary | ICD-10-CM

## 2021-06-11 DIAGNOSIS — R490 Dysphonia: Secondary | ICD-10-CM

## 2021-06-23 ENCOUNTER — Ambulatory Visit
Admission: RE | Admit: 2021-06-23 | Discharge: 2021-06-23 | Disposition: A | Discharge status: Home / Self Care | Payer: Medicare HMO | Source: Ambulatory Visit | Attending: Otolaryngology | Admitting: Otolaryngology

## 2021-06-23 ENCOUNTER — Encounter (HOSPITAL_COMMUNITY): Payer: Self-pay | Admitting: Hematology & Oncology

## 2021-06-23 DIAGNOSIS — C320 Malignant neoplasm of glottis: Secondary | ICD-10-CM

## 2021-06-23 DIAGNOSIS — R490 Dysphonia: Secondary | ICD-10-CM

## 2021-06-23 IMAGING — CT CT NECK W/ CM
5 of 6 series · 14 of 33 positions shown, 16 images · IV contrast (iopamidol)
Comparison: Ultrasound thyroid [DATE]

CLINICAL DATA: Cancer of the glottis.  History of breast cancer.

EXAM:
CT NECK WITH CONTRAST
TECHNIQUE: Multidetector CT imaging of the neck was performed using the
standard protocol following the bolus administration of intravenous
contrast.
CONTRAST:  75mL [JN] IOPAMIDOL ([JN]) INJECTION 76%

[Series 2: neck 2.00 br40 s3 st/ no angle · axial · 0.51mm/px · z∈[-704,-620]mm · 2 of 127 slices shown, 3 images]
[im 43/127  soft-tissue]
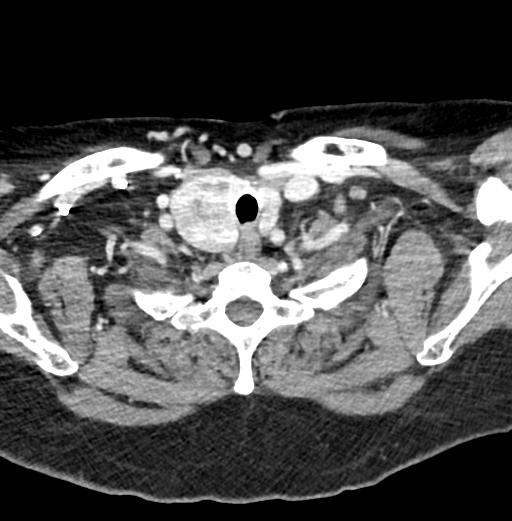
[im 43/127  bone]
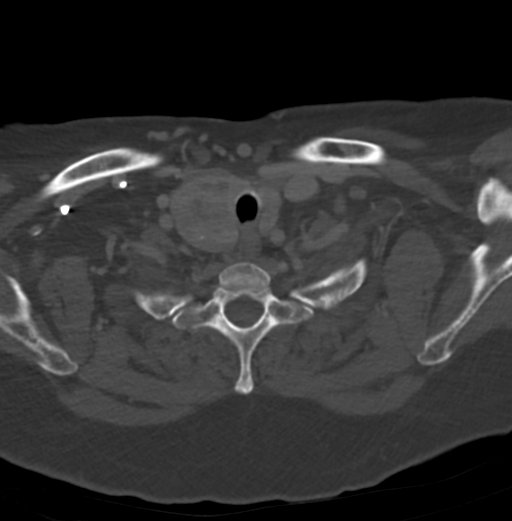
[im 85/127  bone]
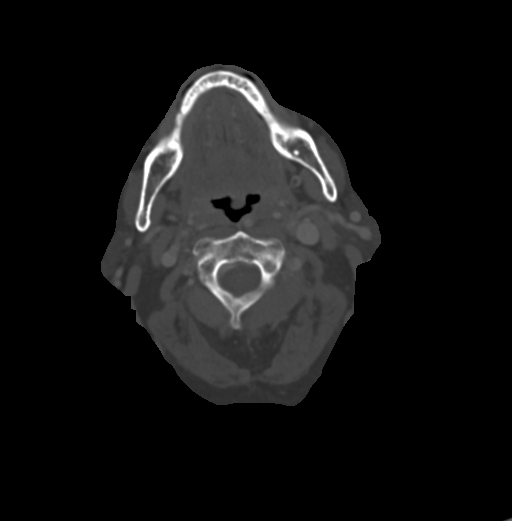

[Series 4: neck 2.00 br60 s3 bone/ no angle · axial · 0.51mm/px · z∈[-704,-620]mm · 2 of 127 slices shown]
[im 43/127  bone]
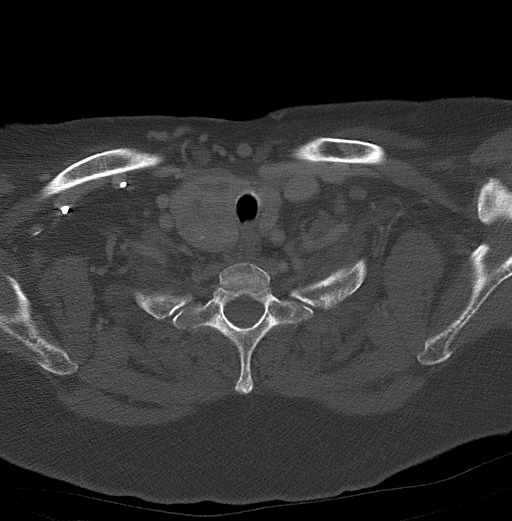
[im 85/127  bone]
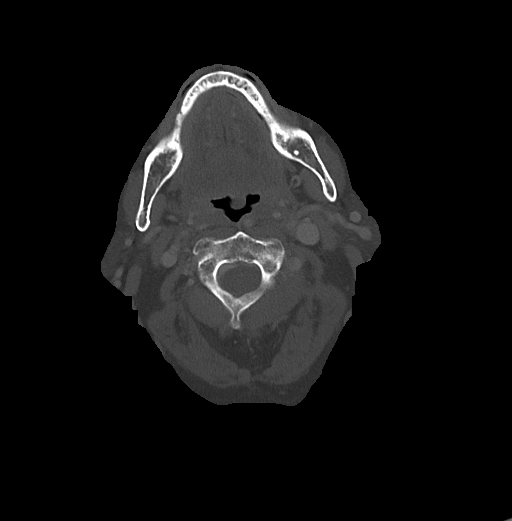

[Series 6: neck 2.00 br36 s3 angled axial (person_name) · axial · 0.51mm/px · z∈[-704,-620]mm · 2 of 127 slices shown]
[im 43/127  bone]
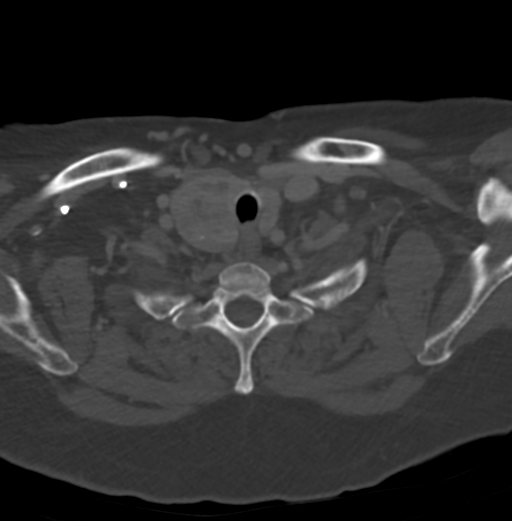
[im 85/127  bone]
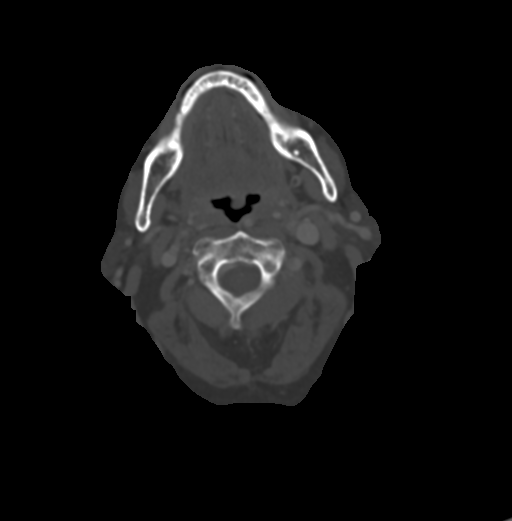

[Series 10: neck 2.00 br40 s3 (person_name) · coronal · 0.50mm/px · 3 of 131 slices shown (1 of 2)]
[im 27/131  bone]
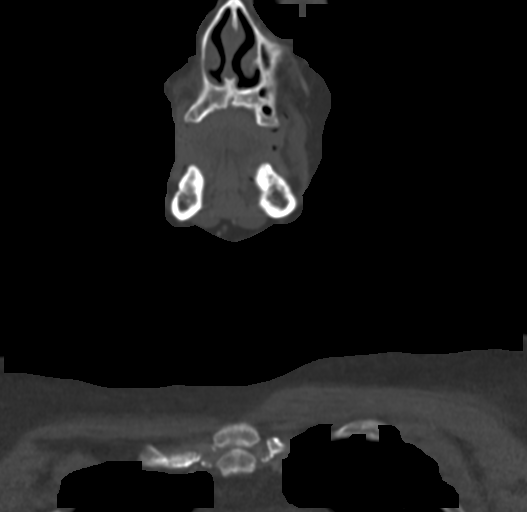
[im 53/131  bone]
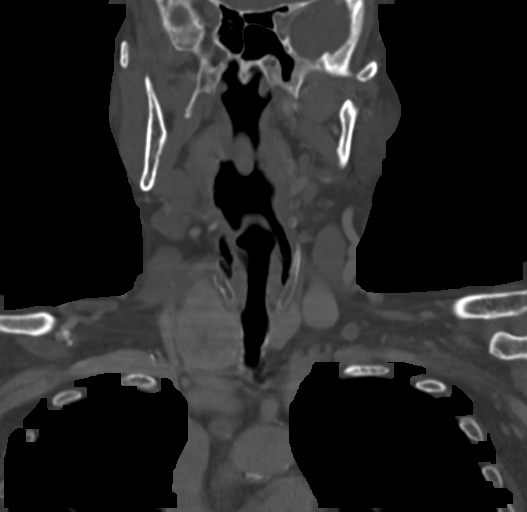
[im 79/131  bone]
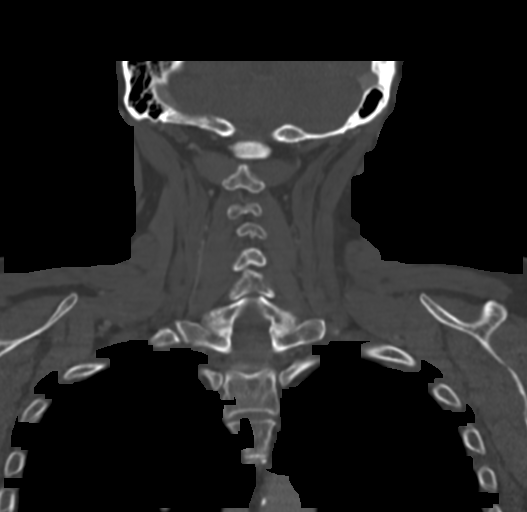

[Series 12: neck 2.00 br40 s3 (person_name) · sagittal · 0.50mm/px · 5 of 131 slices shown, 6 images (2 of 2)]
[im 44/131  bone]
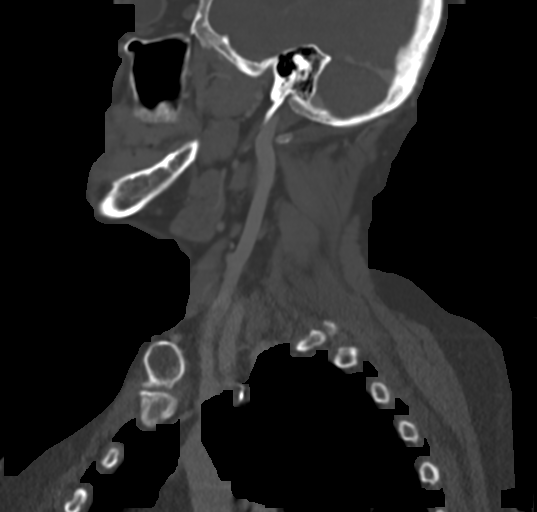
[im 55/131  bone]
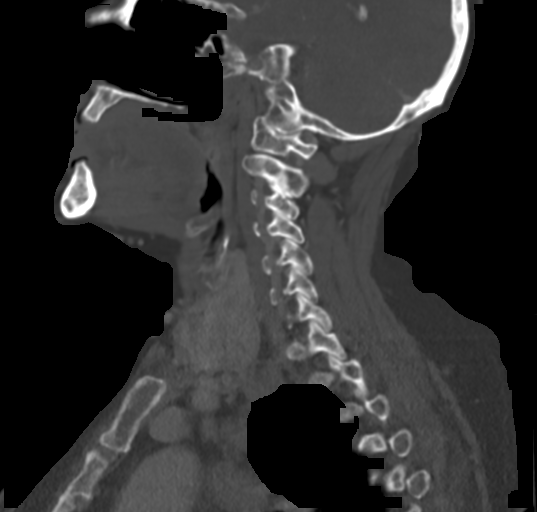
[im 66/131  soft-tissue]
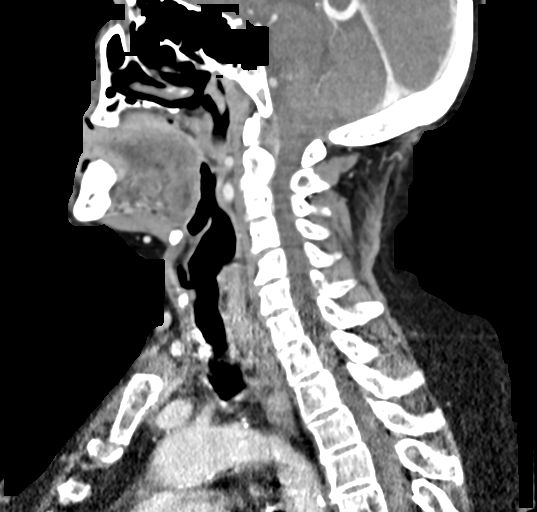
[im 66/131  bone]
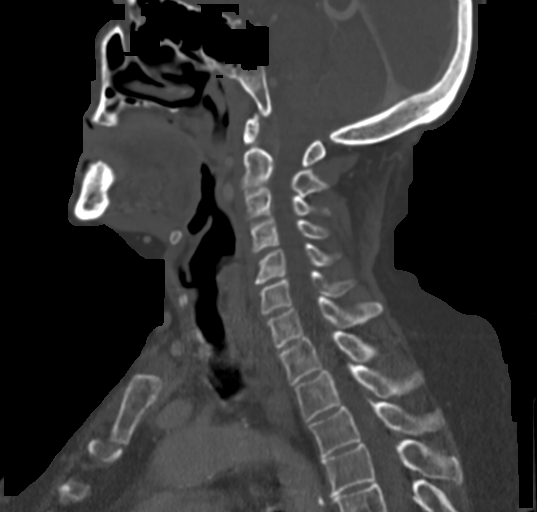
[im 76/131  bone]
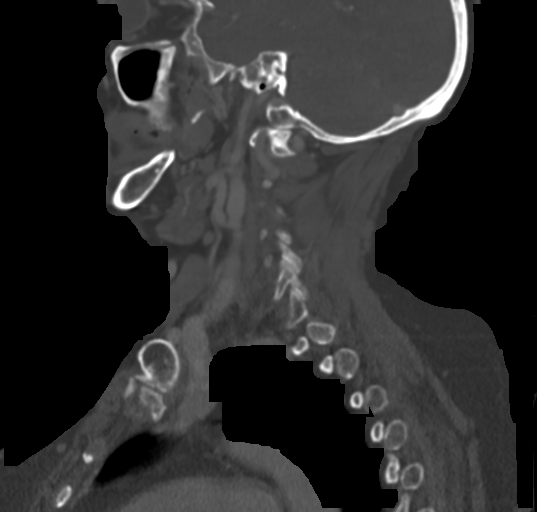
[im 87/131  bone]
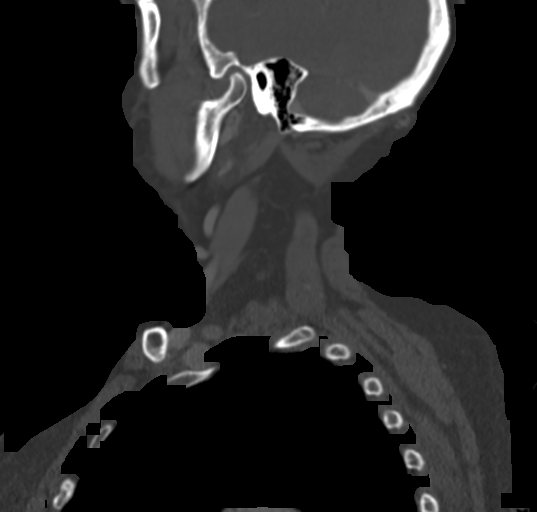

[14 of 33 positions shown; findings below may reference images not displayed]

FINDINGS: Pharynx and larynx: Normal tongue. Normal pharynx. Normal
epiglottis.

9 mm nodule along the right vocal cord anteriorly. This does not
cross the midline.

Salivary glands: No inflammation, mass, or stone.

Thyroid: Large right thyroid nodule measures 39 x 34 mm with
heterogeneous enhancement. No calcifications. Recent thyroid
ultrasound recommend biopsy of this lesion. Normal left lobe of the
thyroid

Lymph nodes: No enlarged lymph nodes in the neck.

Mediastinal lymph nodes described below.

Vascular: Normal vascular enhancement.

Limited intracranial: Negative

Visualized orbits: Negative

Mastoids and visualized paranasal sinuses: Paranasal sinuses well
aerated. Mild bony thickening left maxillary sinus without mucosal
edema.

Skeleton: No focal abnormality

Upper chest: Superior mediastinal adenopathy. Right superior
paratracheal lymph node 17 x 27 mm. Right mid paratracheal lymph
node 15 mm. Precarinal lymph node 30 mm.

Apical emphysema.  No parenchymal lung mass.

Other: None
IMPRESSION: 1. 9 mm nodule right vocal cord consistent with history of glottic
carcinoma. No enlarged lymph nodes in the neck
2. Right paratracheal lymph node enlargement, suspicious for
metastatic disease. Recommend CT chest with contrast to evaluate for
additional adenopathy and possible primary lung carcinoma.
3. Right thyroid nodule 39 x 34 mm. Recommend biopsy to rule out
neoplasm.

## 2021-06-23 MED ORDER — IOPAMIDOL (ISOVUE-370) INJECTION 76%
75.0000 mL | Freq: Once | INTRAVENOUS | Status: AC | PRN
  Administered 2021-06-23: 75 mL via INTRAVENOUS

## 2021-06-25 ENCOUNTER — Other Ambulatory Visit: Payer: Self-pay | Admitting: Otolaryngology

## 2021-07-09 ENCOUNTER — Ambulatory Visit: Payer: Medicare HMO | Admitting: Internal Medicine

## 2021-07-12 ENCOUNTER — Telehealth: Payer: Self-pay | Admitting: Radiation Oncology

## 2021-07-12 ENCOUNTER — Other Ambulatory Visit: Payer: Self-pay | Admitting: Otolaryngology

## 2021-07-12 ENCOUNTER — Other Ambulatory Visit (HOSPITAL_COMMUNITY): Payer: Self-pay | Admitting: Otolaryngology

## 2021-07-12 DIAGNOSIS — C320 Malignant neoplasm of glottis: Secondary | ICD-10-CM

## 2021-07-14 NOTE — Progress Notes (Signed)
Oncology Nurse Navigator Documentation   Placed introductory call to new referral patient Regina Mcmahon. Introduced myself as the H&N oncology nurse navigator that works with Dr. Isidore Moos and Dr. Chryl Heck to whom she has been referred by Dr. Redmond Baseman. She confirmed understanding of referral. Briefly explained my role as her navigator, provided my contact information.  Confirmed understanding of upcoming appts and [facility] location, explained arrival and registration process.   I encouraged her to call with questions/concerns as she moves forward with appts and procedures.   She verbalized understanding of information provided, expressed appreciation for my call.   Navigator Initial Assessment Employment Status: she is retired Currently on [employer] / STD:no Living Situation: she lives alone with her two dogs.  Support System: sister, daughter PCP: Ulyses Southward NP DKC:CQFJ, patient wears dentures Financial Concerns:no Transportation Needs: no Sensory Deficits:no Language Barriers/Interpreter Needed:  no Ambulation Needs: no DME Used in Home: no Psychosocial Needs:  no Concerns/Needs Understanding Cancer:  addressed/answered by navigator to best of ability Self-Expressed Needs: no   Harlow Asa RN, BSN, OCN Head & Neck Oncology Nurse [place] [facility] Phone # [phone]  Fax # [phone]

## 2021-07-15 ENCOUNTER — Other Ambulatory Visit: Payer: Self-pay | Admitting: Otolaryngology

## 2021-07-15 DIAGNOSIS — E041 Nontoxic single thyroid nodule: Secondary | ICD-10-CM

## 2021-07-21 ENCOUNTER — Telehealth: Payer: Self-pay | Admitting: Hematology and Oncology

## 2021-07-21 NOTE — Telephone Encounter (Signed)
Scheduled appt per 12/16 referral. Pt is aware of appt date and time. Pt is aware to arrive 15 mins prior to appt.

## 2021-07-22 ENCOUNTER — Ambulatory Visit (HOSPITAL_COMMUNITY): Payer: Medicare HMO

## 2021-07-22 ENCOUNTER — Encounter (HOSPITAL_COMMUNITY): Payer: Self-pay

## 2021-07-23 ENCOUNTER — Ambulatory Visit (HOSPITAL_COMMUNITY)
Admission: RE | Admit: 2021-07-23 | Discharge: 2021-07-23 | Disposition: A | Discharge status: Home / Self Care | Payer: Medicare HMO | Source: Ambulatory Visit | Attending: Otolaryngology | Admitting: Otolaryngology

## 2021-07-23 ENCOUNTER — Other Ambulatory Visit: Payer: Self-pay

## 2021-07-23 ENCOUNTER — Encounter (HOSPITAL_COMMUNITY): Payer: Self-pay

## 2021-07-23 ENCOUNTER — Encounter (HOSPITAL_COMMUNITY)
Admission: RE | Admit: 2021-07-23 | Discharge: 2021-07-23 | Disposition: A | Discharge status: Home / Self Care | Payer: Medicare HMO | Source: Ambulatory Visit | Attending: Otolaryngology | Admitting: Otolaryngology

## 2021-07-23 DIAGNOSIS — R590 Localized enlarged lymph nodes: Secondary | ICD-10-CM | POA: Diagnosis not present

## 2021-07-23 DIAGNOSIS — C320 Malignant neoplasm of glottis: Secondary | ICD-10-CM

## 2021-07-23 DIAGNOSIS — R911 Solitary pulmonary nodule: Secondary | ICD-10-CM | POA: Insufficient documentation

## 2021-07-23 DIAGNOSIS — I2510 Atherosclerotic heart disease of native coronary artery without angina pectoris: Secondary | ICD-10-CM | POA: Insufficient documentation

## 2021-07-23 DIAGNOSIS — I700 Atherosclerosis of aorta: Secondary | ICD-10-CM | POA: Diagnosis not present

## 2021-07-23 DIAGNOSIS — J439 Emphysema, unspecified: Secondary | ICD-10-CM | POA: Insufficient documentation

## 2021-07-23 LAB — GLUCOSE, CAPILLARY: Glucose-Capillary: 106 mg/dL — ABNORMAL HIGH (ref 70–99)

## 2021-07-23 LAB — POCT I-STAT CREATININE: Creatinine, Ser: 0.9 mg/dL (ref 0.44–1.00)

## 2021-07-23 IMAGING — PT NM PET TUM IMG INITIAL (PI) SKULL BASE T - THIGH
1 of 7 series · 1 of 25 positions shown · non-contrast
Comparison: Neck CT [DATE] and chest CT [DATE]

CLINICAL DATA: Initial treatment strategy for glottic carcinoma.
Prior history of breast cancer.

EXAM:
NUCLEAR MEDICINE PET SKULL BASE TO THIGH
TECHNIQUE: 10.3 mCi F-18 FDG was injected intravenously. Full-ring PET imaging
was performed from the skull base to thigh after the radiotracer. CT
data was obtained and used for attenuation correction and anatomic
localization.
Fasting blood glucose: 106 mg/dl

[Series 4: ct hn_sk_th 5.0 bf37 · axial · 5.0mm · 0.98mm/px · 1 of 226 slices shown]
[im 169/226  brain]
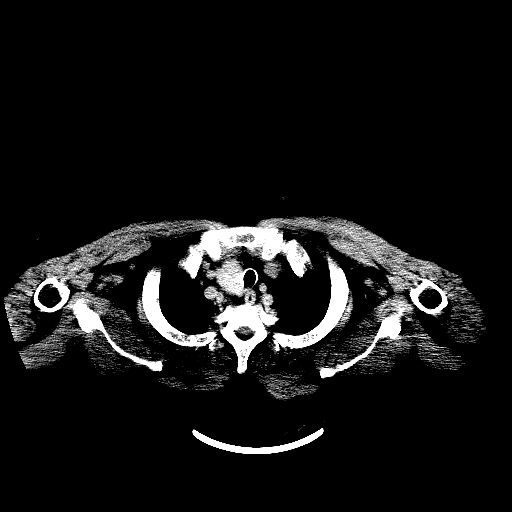

[1 of 25 positions shown; findings below may reference images not displayed]

FINDINGS: Mediastinal blood pool activity: SUV max

Liver activity: SUV max NA

NECK: The right glottic lesion is mildly hypermetabolic with SUV max
of 4.73.

Mild symmetric hypermetabolism in the tonsillar regions bilaterally
likely lymphoid tissue. There is a small right level 2 lymph node
measuring 7 mm on image 38/4. SUV max is 2.75.

Incidental CT findings: Right thyroid goiter but no hypermetabolism
is suggest thyroid cancer.

CHEST: Left-sided subclavicular node on image 52/4 measures 10 mm
and has an SUV max of 8.81.

Upper right paratracheal node measuring 14 mm on image 63/4 has an
SUV max of

12 mm prevascular node on image 72/4 has an SUV max of 11.91.

Subcarinal nodal mass measures approximately 22 mm on image number
81/4 and has an SUV max of

Left infrahilar node on image 81/4 measures 15 mm and has an SUV max
of

Left lower lobe pulmonary nodule better seen on the prior chest CT
is hypermetabolic with SUV max of 3.20.

No breast masses or axillary adenopathy.

Incidental CT findings: Stable aortic and coronary artery
calcifications. Stable underlying emphysematous changes.

ABDOMEN/PELVIS: No abnormal hypermetabolic activity within the
liver, pancreas, adrenal glands, or spleen. No hypermetabolic lymph
nodes in the abdomen or pelvis.

Incidental CT findings: Scattered aortic calcifications but no
aneurysm.

SKELETON: No findings suspicious for osseous metastatic disease.
Moderate uptake noted around the right hip prosthesis.

Incidental CT findings: none
IMPRESSION: 1. Small right glottic lesion is mildly hypermetabolic and
consistent with known neoplasm.
2. Borderline right level 2 lymph node.
3. Left supraclavicular, mediastinal and hilar lymphadenopathy.
4. Hypermetabolic left lower lobe pulmonary nodule likely metastatic
focus.
5. No findings for abdominal/pelvic metastatic disease or osseous
metastatic disease.

## 2021-07-23 IMAGING — CT CT CHEST W/ CM
2 of 5 series · 15 of 36 positions shown, 18 images · IV contrast (OMNIPAQUE)
Comparison: None.

CLINICAL DATA: New diagnosis glottic cancer, chest staging,
additional history of left breast cancer status post lumpectomy,
chemotherapy, and radiation.

EXAM:
CT CHEST WITH CONTRAST
TECHNIQUE: Multidetector CT imaging of the chest was performed during
intravenous contrast administration.
CONTRAST:  60mL OMNIPAQUE IOHEXOL 350 MG/ML SOLN

[Series 4: super d · axial · 0.82mm/px · z∈[+1507,+1765]mm · 12 of 306 slices shown, 15 images]
[im 24/306  mediastinal]
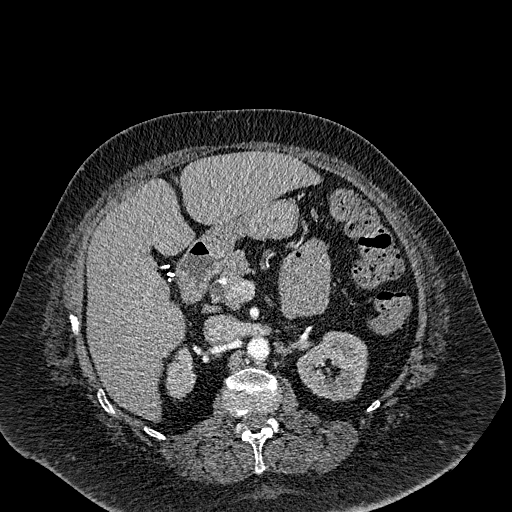
[im 24/306  lung]
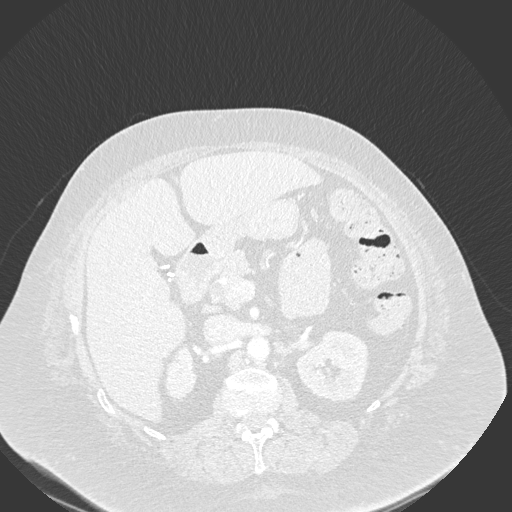
[im 47/306  lung]
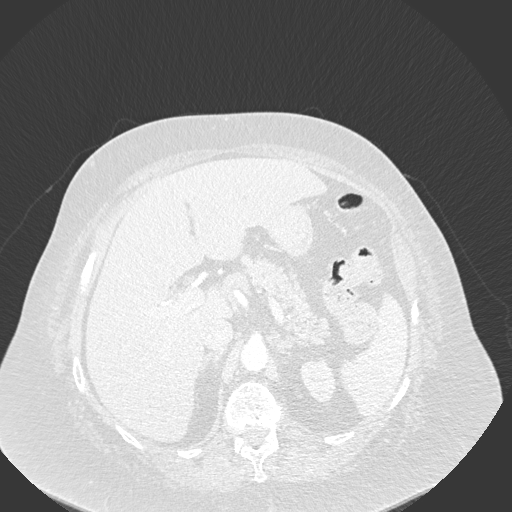
[im 71/306  lung]
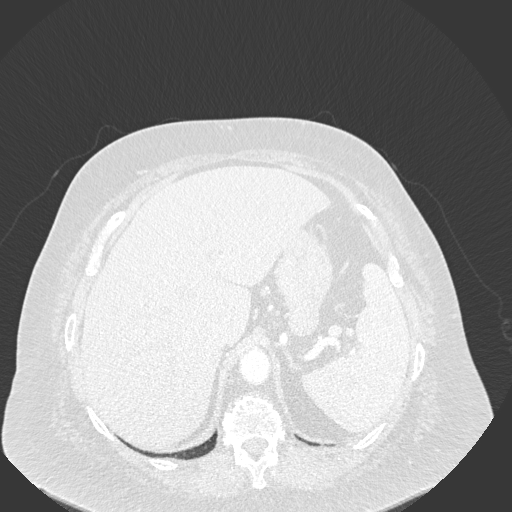
[im 94/306  lung]
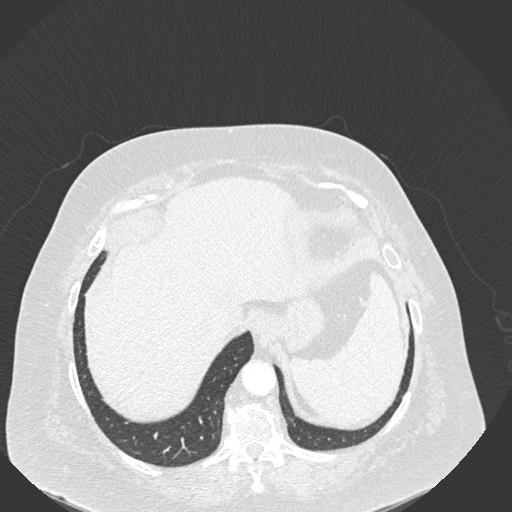
[im 118/306  mediastinal]
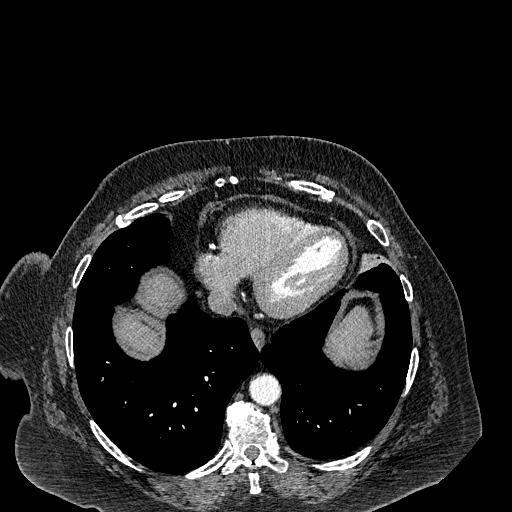
[im 118/306  lung]
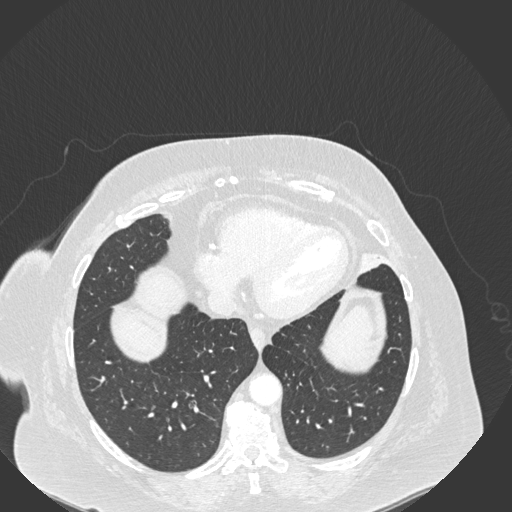
[im 141/306  lung]
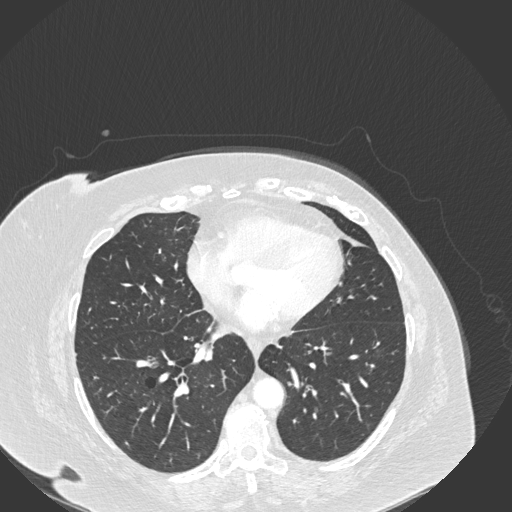
[im 165/306  lung]
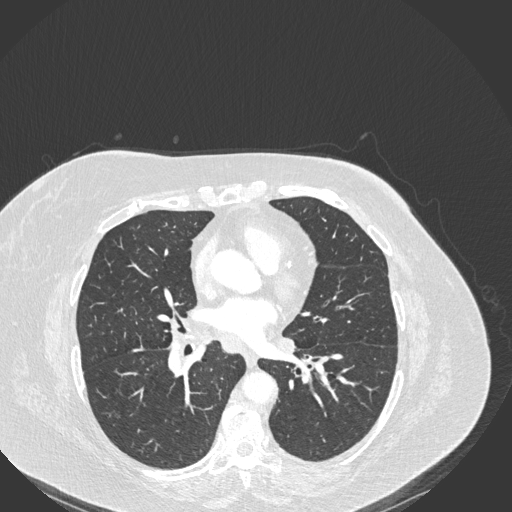
[im 188/306  lung]
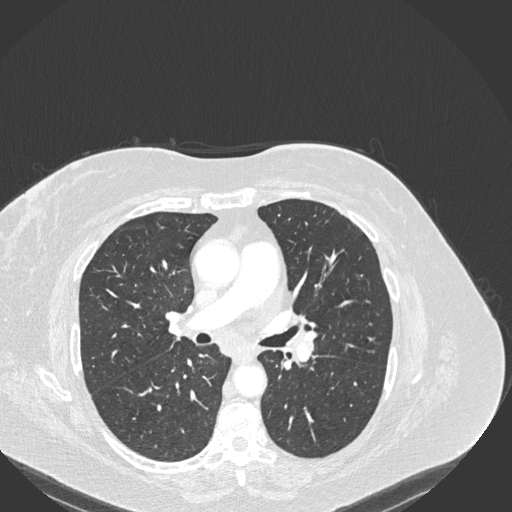
[im 212/306  mediastinal]
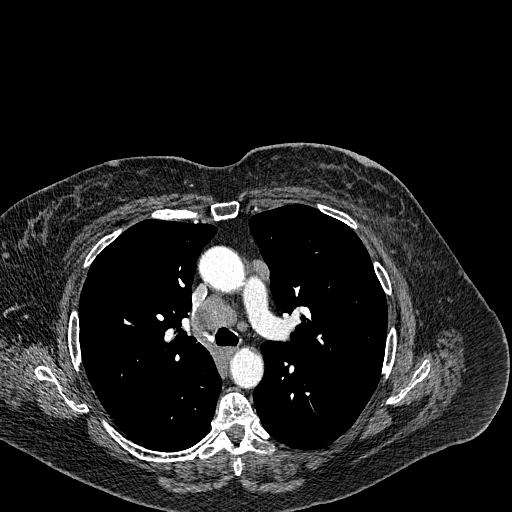
[im 212/306  lung]
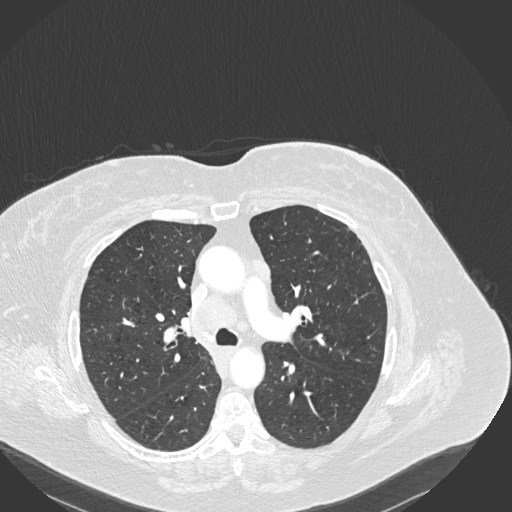
[im 235/306  lung]
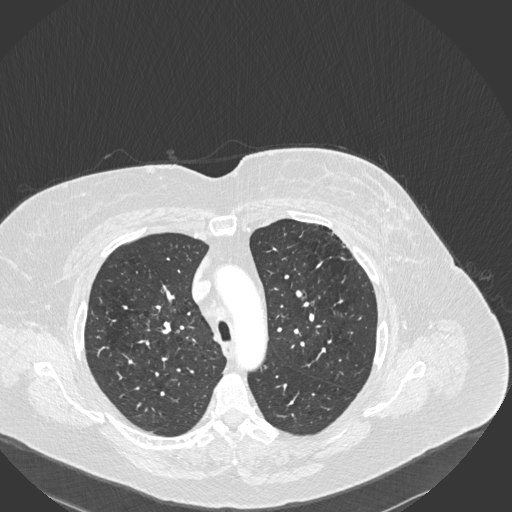
[im 259/306  lung]
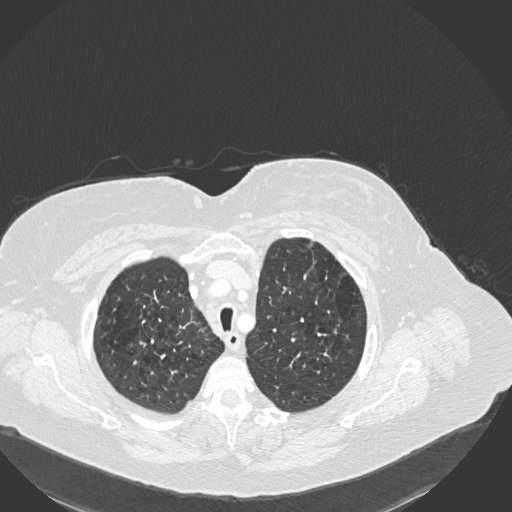
[im 282/306  lung]
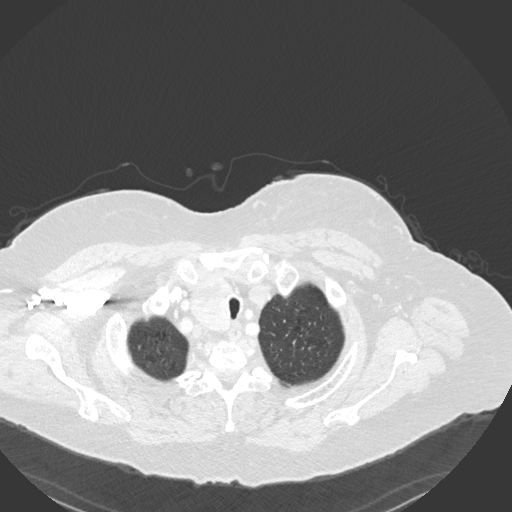

[Series 6: coronal · coronal · 0.72mm/px · 3 of 140 slices shown]
[im 28/140  lung]
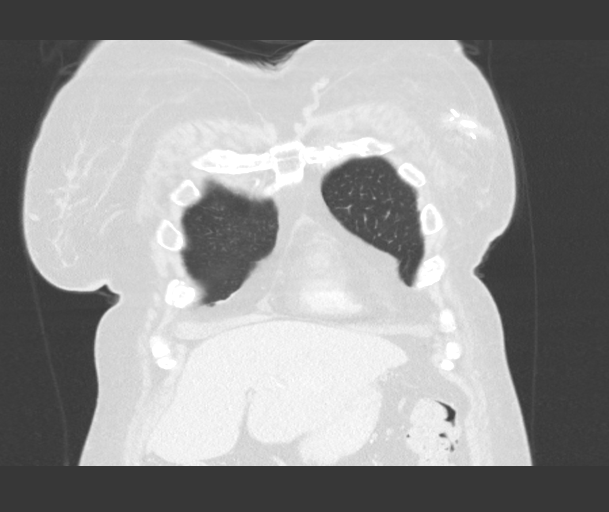
[im 56/140  lung]
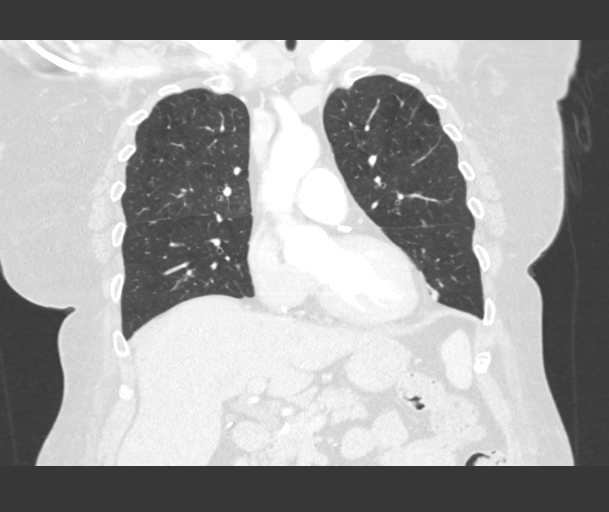
[im 84/140  lung]
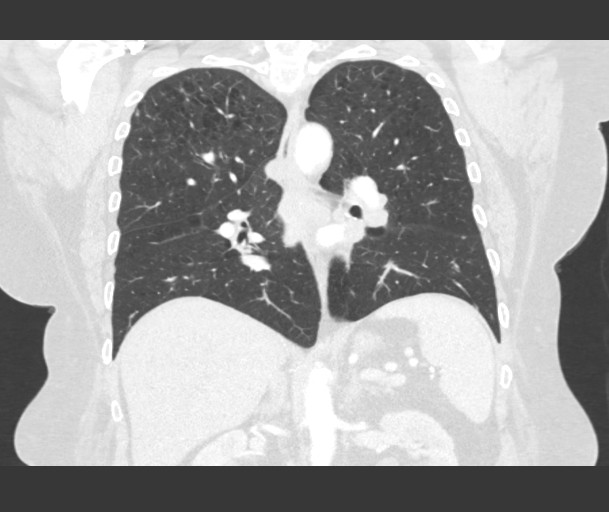

[15 of 36 positions shown; findings below may reference images not displayed]

FINDINGS: Cardiovascular: Scattered aortic atherosclerosis. Normal heart size.
Left and right coronary artery calcifications. No pericardial
effusion.

Mediastinum/Nodes: Multiple enlarged mediastinal, bilateral hilar,
and left supraclavicular lymph nodes, largest, partially necrotic
appearing pretracheal nodes measuring up to 3.5 x 2.9 cm (series 2,
image 47). Enlarged, multinodular right lobe of the thyroid
measuring at least 4.4 x 3.4 cm (series 2, image 7). Trachea, and
esophagus demonstrate no significant findings.

Lungs/Pleura: Moderate centrilobular emphysema. Diffuse bilateral
bronchial wall thickening. Multiple small bilateral pulmonary
nodules, largest in the anterior left lower lobe measuring 1.1 x
cm (series 5, image 79). Additional smaller nodules scattered
throughout measure no greater than 0.4 cm, a subpleural nodule of
the medial right lower lobe (series 5, image 64). No pleural
effusion or pneumothorax.

Upper Abdomen: No acute abnormality.

Musculoskeletal: No chest wall mass or suspicious bone lesions
identified. Status post left lumpectomy.
IMPRESSION: 1. Multiple enlarged mediastinal, bilateral hilar, and left
supraclavicular lymph nodes, consistent with nodal metastatic
disease.
2. Multiple small bilateral pulmonary nodules, largest in the
anterior left lower lobe measuring 1.1 x 0.7 cm, suspicious for
pulmonary metastatic disease.
3. Additional smaller nodules scattered throughout measure no
greater than 0.4 cm. These are nonspecific and some or all may be
infectious or inflammatory although pulmonary metastatic disease is
not excluded.
4. Enlarged, multinodular right lobe of the thyroid. Recommend
thyroid ultrasound if and when clinically appropriate given presumed
metastatic malignancy (ref: [HOSPITAL]. [DATE]):
5. Emphysema.
6. Coronary artery disease.

Aortic Atherosclerosis ([R0]-[R0]) and Emphysema ([R0]-[R0]).

## 2021-07-23 MED ORDER — SODIUM CHLORIDE (PF) 0.9 % IJ SOLN
INTRAMUSCULAR | Status: AC
  Filled 2021-07-23: qty 50

## 2021-07-23 MED ORDER — IOHEXOL 350 MG/ML SOLN
60.0000 mL | Freq: Once | INTRAVENOUS | Status: AC | PRN
  Administered 2021-07-23: 60 mL via INTRAVENOUS

## 2021-07-23 MED ORDER — FLUDEOXYGLUCOSE F - 18 (FDG) INJECTION
10.4000 | Freq: Once | INTRAVENOUS | Status: AC
  Administered 2021-07-23: 10.3 via INTRAVENOUS

## 2021-07-29 ENCOUNTER — Telehealth: Payer: Self-pay | Admitting: Emergency Medicine

## 2021-07-29 ENCOUNTER — Other Ambulatory Visit: Payer: Self-pay

## 2021-07-29 ENCOUNTER — Encounter: Payer: Self-pay | Admitting: Emergency Medicine

## 2021-07-29 ENCOUNTER — Ambulatory Visit: Payer: Medicare HMO | Admitting: Emergency Medicine

## 2021-07-29 VITALS — BP 118/78 | HR 66 | Temp 97.9°F | Ht 64.0 in | Wt 218.8 lb

## 2021-07-29 DIAGNOSIS — R9389 Abnormal findings on diagnostic imaging of other specified body structures: Secondary | ICD-10-CM | POA: Diagnosis not present

## 2021-07-29 DIAGNOSIS — J449 Chronic obstructive pulmonary disease, unspecified: Secondary | ICD-10-CM | POA: Diagnosis not present

## 2021-07-29 NOTE — H&P (View-Only) (Signed)
Subjective:    Patient ID: Regina Mcmahon, female    DOB: 07-08-1951, 71 y.o.   MRN: [id number]  HPI 72 year old active smoker (50 pack years) with a history of left breast cancer, COPD/asthma, hypertension, GERD, depression/anxiety.  She has been seen in our office before by Dr. Melvyn Novas.  Under recent evaluation by Dr. 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  PET scan was ordered and performed on 07/23/2021 as below.  CT chest 07/23/2021 reviewed by me, shows multiple bilateral mediastinal and hilar lymphadenopathy, left supraclavicular nodes.  Multiple small bilateral pulmonary nodules largest in the left lower lobe 1.1 x 0.7 cm consistent with metastatic disease, enlarged multinodular right thyroid nodule   Review of Systems As per HPi  Past Medical History:  Diagnosis Date   Asthma    Asymptomatic varicose veins    Breast cancer ([place]) 06/2014   left   Chronic airway obstruction (HCC)    Complication of anesthesia    difficulty waking up   COPD (chronic obstructive pulmonary disease) (HCC)    Depression    GERD (gastroesophageal reflux disease)    Hemorrhage rectum    and anus.   Hypertension    Insomnia    Other symptoms involving digestive system(787.99)    Pain    Hx.pain in joint involving pelvic region and thigh; pain in limb   Palpitations    Panic attacks    Personal history of chemotherapy 2016   Personal history of radiation therapy 2016   Pre-diabetes    Sinusitis    Symptomatic menopausal or female climacteric states      Family History  Problem Relation Age of Onset   Diabetes Mother    Ovarian cancer Mother 60   Other Father        died in MVA at age 37   Other Sister        mitral valve disorder   Melanoma Sister 19       removed from back of leg   Colon cancer Brother        early 65s, succumbed to disease   Cancer Paternal Aunt        leukemia, bone, or lung  cancer   Alzheimer's disease Maternal Grandmother    Heart attack Maternal Grandfather    Heart attack Paternal Grandmother    COPD Paternal Grandfather    Emphysema Paternal Grandfather    Congestive Heart Failure Paternal Aunt    Stomach cancer Paternal Uncle    Kidney cancer Maternal Uncle    Cancer Cousin        dx. teens/dx. 33s   Cancer Cousin    Colon cancer Cousin      Social History   Socioeconomic History   Marital status: Divorced    Spouse name: Not on file   Number of children: 2   Years of education: Not on file   Highest education level: Not on file  Occupational History   Not on file  Tobacco Use   Smoking status: Every Day    Packs/day: 1.00    Years: 42.00    Pack years: 42.00    Types: Cigarettes   Smokeless tobacco: Never  Vaping Use   Vaping Use: Never used  Substance and Sexual Activity   Alcohol use: No    Alcohol/week: 0.0 standard drinks   Drug use: No   Sexual activity: Never    Birth control/protection:  None  Other Topics Concern   Not on file  Social History Narrative   Not on file   Social Determinants of Health   Financial Resource Strain: Not on file  Food Insecurity: Not on file  Transportation Needs: Not on file  Physical Activity: Not on file  Stress: Not on file  Social Connections: Not on file  Intimate Partner Violence: Not on file     No Known Allergies   Outpatient Medications Prior to Visit  Medication Sig Dispense Refill   albuterol (PROVENTIL HFA;VENTOLIN HFA) 108 (90 BASE) MCG/ACT inhaler Inhale 2 puffs into the lungs every 6 (six) hours as needed for wheezing or shortness of breath.      ALPRAZolam (XANAX) 0.5 MG tablet Take 0.5 mg by mouth 3 (three) times daily as needed for anxiety.      ANORO ELLIPTA 62.5-25 MCG/INH AEPB Inhale 1 puff into the lungs daily as needed (SOB).     calcium carbonate (TUMS - DOSED IN MG ELEMENTAL CALCIUM) 500 MG chewable tablet Chew 1 tablet by mouth 3 (three) times daily as needed  for indigestion or heartburn. Reported on 12/07/2015     cephALEXin (KEFLEX) 500 MG capsule Take 1 capsule (500 mg total) by mouth 4 (four) times daily. 60 capsule 0   furosemide (LASIX) 20 MG tablet Take 20 mg by mouth daily.     hydrochlorothiazide (HYDRODIURIL) 12.5 MG tablet Take 12.5 mg by mouth daily.     HYDROcodone-acetaminophen (NORCO/VICODIN) 5-325 MG tablet Take 1 tablet by mouth every 6 (six) hours as needed for severe pain. 50 tablet 0   pantoprazole (PROTONIX) 40 MG tablet Take 1 tablet by mouth once daily 90 tablet 0   potassium citrate (UROCIT-K) 10 MEQ (1080 MG) SR tablet Take 10 mEq by mouth daily.     sertraline (ZOLOFT) 100 MG tablet Take 100 mg by mouth daily.     traMADol (ULTRAM) 50 MG tablet TAKE 1 TABLET BY MOUTH EVERY 6 HOURS AS NEEDED 30 tablet 0   aspirin EC 325 MG EC tablet Take 1 tablet (325 mg total) by mouth daily with breakfast. Must take at least 4 weeks postop for DVT prophylaxis (Patient not taking: Reported on 04/15/2021) 30 tablet 0   gabapentin (NEURONTIN) 300 MG capsule TAKE 1 CAPSULE BY MOUTH THREE TIMES DAILY (Patient not taking: Reported on 07/29/2021) 90 capsule 0   No facility-administered medications prior to visit.        Objective:   Physical Exam Vitals:   07/29/21 1404  BP: 118/78  Pulse: 66  Temp: 97.9 F (36.6 C)  TempSrc: Oral  SpO2: 95%  Weight: 218 lb 12.8 oz (99.2 kg)  Height: 5\' 4"  (1.626 m)   Gen: Pleasant, well-nourished, in no distress,  normal affect  ENT: No lesions,  mouth clear,  oropharynx clear, no postnasal drip  Neck: No JVD, mild stridor, hoarse voice  Lungs: No use of accessory muscles, no crackles or wheezing on normal respiration, no wheeze on forced expiration  Cardiovascular: RRR, heart sounds normal, no murmur or gallops, no peripheral edema  Musculoskeletal: No deformities, no cyanosis or clubbing  Neuro: alert, awake, non focal  Skin: Warm, no lesions or rash      Assessment & Plan:   COPD GOLD  II  /  still smoking  Anoro as ordered   Abnormal CT of the chest  Complicated case.  She did not know anything about the details of the PET scan, so we reviewed all the  findings in detail today.  I believe that she will ultimately need a tissue diagnosis from mediastinal adenopathy, probably also with a small left pulmonary nodule that was hypermetabolic on PET scan.  She has not seen radiation oncology or oncology yet but presume they will need this data.  I will go ahead and start working on setting up bronchoscopy with EBUS and navigation.  Keep your scheduled visits with oncology and radiation oncology Get your thyroid needle biopsy as planned We will work on arranging for bronchoscopy to be done under general anesthesia as an outpatient at [facility] endoscopy.  You will need a designated driver.  You will need to be off your aspirin 2 days prior to the procedure.  We will try to get this arranged for 08/23/2021. We will coordinate with oncology and radiation oncology to ensure that they do need the information that we would obtain a bronchoscopy.  If they would like to initiate treatment without those biopsies then we will cancel the procedure. Follow with Dr Lamonte Sakai in 1 month   Baltazar Apo, MD, PhD 07/29/2021, 2:48 PM [facility] [phone] or if no answer before 7:00PM call [phone] For any issues after 7:00PM please call eLink [phone]

## 2021-07-29 NOTE — Patient Instructions (Addendum)
Keep your scheduled visits with oncology and radiation oncology Get your thyroid needle biopsy as planned We will work on arranging for bronchoscopy to be done under general anesthesia as an outpatient at [facility] endoscopy.  You will need a designated driver.  You will need to be off your aspirin 2 days prior to the procedure.  We will try to get this arranged for 08/23/2021. We will coordinate with oncology and radiation oncology to ensure that they do need the information that we would obtain a bronchoscopy.  If they would like to initiate treatment without those biopsies then we will cancel the procedure. Continue your Anoro as you have been taking it. Follow with Regina Mcmahon in 1 month

## 2021-07-29 NOTE — Telephone Encounter (Signed)
I scheduled pt for 1/30 at 7:30 at [facility] End[facility].  Pt will go for covid test on 1/27.  I called [facility] & spoke to [facility] - she will have disc sent to [facility] End[facility].  I called pt & left vm for her to call me for appt info.

## 2021-07-29 NOTE — Assessment & Plan Note (Signed)
°  Complicated case.  She did not know anything about the details of the PET scan, so we reviewed all the findings in detail today.  I believe that she will ultimately need a tissue diagnosis from mediastinal adenopathy, probably also with a small left pulmonary nodule that was hypermetabolic on PET scan.  She has not seen radiation oncology or oncology yet but presume they will need this data.  I will go ahead and start working on setting up bronchoscopy with EBUS and navigation.  Keep your scheduled visits with oncology and radiation oncology Get your thyroid needle biopsy as planned We will work on arranging for bronchoscopy to be done under general anesthesia as an outpatient at [facility] endoscopy.  You will need a designated driver.  You will need to be off your aspirin 2 days prior to the procedure.  We will try to get this arranged for 08/23/2021. We will coordinate with oncology and radiation oncology to ensure that they do need the information that we would obtain a bronchoscopy.  If they would like to initiate treatment without those biopsies then we will cancel the procedure. Follow with Dr Lamonte Sakai in 1 month

## 2021-07-29 NOTE — Progress Notes (Signed)
Radiation Oncology         [phone] ________________________________  Initial Outpatient Consultation  Name: Regina Mcmahon MRN: [id number]  Date: 07/30/2021  DOB: Oct 11, 1950  FB:PZWC, Edwinna Areola, MD  Melida Quitter, MD   REFERRING PHYSICIAN: Melida Quitter, MD  DIAGNOSIS: C32.1   ICD-10-CM   1. Malignant neoplasm of glottis ([place])  C32.0 oxymetazoline (AFRIN) 0.05 % nasal spray 2 spray    Fiberoptic laryngoscopy    2. 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 [facility]) Staging form: Breast, AJCC 7th Edition - Clinical stage from 07/01/2014: Stage IIA (T2, N0, M0) - Unsigned Staged by: Managing physician Diagnostic confirmation: Positive histology Laterality: Left Tumor size (mm): 2.6 Method of detection of distant metastases: Clinical Histologic grade (G): G2 Histologic grading system: 3 grade system Lymph-vascular invasion (LVI): LVI present/identified, NOS Residual tumor (R): R0 - None Paget's disease: Negative Estrogen receptor status: Positive Estrogen receptor test method: IHC Intensity of estrogen receptor staining: Strong Percentage of positive estrogen receptors (%): 100 Progesterone receptor status: Positive Progesterone receptor test method: IHC Intensity of progesterone receptor staining: Strong Percentage of positive progesterone receptors (%): 91 HER2 status: Negative 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 IHC of regional lymph nodes: Positive Results for molecular studies of regional lymph nodes: Not assessed Circulating tumor cells (CTC): Not assessed Disseminated tumor cells: Not assessed Stage used in treatment planning: Yes National guidelines used in treatment planning: Yes Type of national guideline used in treatment planning: NCCN - Pathologic stage from 08/20/2014: Stage IIB (T2, N1a, cM0) - Unsigned  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[place]) Staging form: Larynx -  Supraglottis, AJCC 8th Edition - Clinical stage from 07/30/2021: cT1, cN1 - Unsigned   CHIEF COMPLAINT: Here to discuss management of cancer  HISTORY OF PRESENT ILLNESS::Regina Mcmahon is a 71 y.o. female who presented with a poor sense of taste following a hip replacement in early August of 2022. Following shortly after, the patient reported onset of vomiting, nausea, dry mouth, and lack of taste. About a month later, her sense of taste returned but her voice started sounding hoarse. Patient also reported that her voice started out raspy, but progressed to severe hoarseness since then. Her throat becomes uncomfortable if she tries to talk a lot. She has no problem swallowing or breathing.  Thyroid US on 06/03/21 revealed a suspicious nodule located in the mid right thyroid lobe, measuring 5.0 cm in the greatest extent, warranting further evaluation.  Subsequently, the patient saw Dr. Redmond Baseman on 06/07/21 who performed a laryngoscopy which revealed a granular mass of the right glottis suggestive of glottic cancer.  Soft tissue neck CT performed on 06/23/21 revealed a 9 mm nodule located about the right vocal cord, consistent with history of glottic carcinoma. No enlarged lymph nodes were appreciated in the neck. An enlarged right paratracheal lymph node was appreciated as well, noted as suspicious for metastatic disease, as well as the right thyroid nodule measuring 39 x 34 mm.   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   Pertinent imaging thus far includes: -- Chest CT on 07/23/21 further revealed: multiple enlarged mediastinal, bilateral hilar, and left supraclavicular lymph nodes, consistent with nodal metastatic Disease; multiple small bilateral pulmonary nodules (largest in the anterior left lower lobe measuring 1.1 x 0.7 cm) suspicious for pulmonary metastatic disease; additional nonspecific  scattered  smaller nodules measuring no greater than 0.4 cm; and enlarged, multinodular right lobe of the thyroid.  --PET on 07/23/21 demonstrated the small right glottic lesion as mildly hypermetabolic and consistent with known neoplasm. PET also demonstrated: a borderline right level 2 lymph node; left supraclavicular, mediastinal and hilar lymphadenopathy; and a hypermetabolic left lower lobe pulmonary nodule, noted as likely a metastatic focus. No other findings for abdominal/pelvic metastatic disease or osseous metastatic disease were appreciated.   Swallowing issues, if any: none reported, though reported discomfort when she talks a lot  Weight Changes: none  Pain status: pain with speech  Other symptoms: severe hoarseness, throat becomes uncomfortable if she tries to talk a lot  Tobacco history, if any: quit smoking for hip surgery in August of 2022 after 40 years of about 1.5 ppd.  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  PREVIOUS RADIATION THERAPY: Yes, under Dr. Pablo Ledger  Radiation treatment dates:   04/06/2015-05/21/2015 Site/dose:   Left breast/ 45 Gy at 1.8 Gy per fraction x 25 fractions.  Left supraclavicular fossa and axilla/ 45 Gy at 1.8 Gy per fraction x 25 fractions Left breast boost/ 16 Gy at 2 Gy per fraction x 8 fractions Beams/energy:  Opposed tangents with reduced fields / 6 and 10 MV photons RAO/LPO with 10 and 15  MV photons Enface electrons / 18 MeV    PAST MEDICAL HISTORY:  has a past medical history of Asthma, Asymptomatic varicose veins, Breast cancer ([place]) (06/2014), Chronic airway obstruction ([place]), Complication of anesthesia, COPD (chronic obstructive pulmonary disease) ([place]), Depression, GERD (gastroesophageal reflux disease), Hemorrhage rectum, Hypertension, Insomnia, Other symptoms involving digestive system(787.99), Pain, Palpitations, Panic attacks, Personal  history of chemotherapy (2016), Personal history of radiation therapy (2016), Pre-diabetes, Sinusitis, and Symptomatic menopausal or female climacteric states.    PAST SURGICAL HISTORY: Past Surgical History:  Procedure Laterality Date   BREAST BIOPSY Left 10/2012   BREAST BIOPSY Left 07/01/2014   malignant   BREAST LUMPECTOMY Left 08/20/2014   CESAREAN SECTION     CHOLECYSTECTOMY  1985   COLONOSCOPY  2002   Dr. Lindalou Hose: internal hemorrhoids, one small rectal polyp, adenomatous path    COLONOSCOPY N/A 11/23/2015   Procedure: COLONOSCOPY;  Surgeon: Danie Binder, MD;  [facility];  Service: Endoscopy;  Laterality: N/A;  1100 - moved to 10:45 - office to notify   ESOPHAGOGASTRODUODENOSCOPY N/A 11/23/2015   Procedure: ESOPHAGOGASTRODUODENOSCOPY (EGD);  Surgeon: Danie Binder, MD;  [facility];  Service: Endoscopy;  Laterality: N/A;   PORT-A-CATH REMOVAL  02/2015   PORTACATH PLACEMENT Right 09/17/2014   Procedure: INSERTION PORT-A-CATH WITH ULTRASOUND;  Surgeon: Erroll Luna, MD;  Location: [place];  Service: General;  Laterality: Right;  IJ   RADIOACTIVE SEED GUIDED PARTIAL MASTECTOMY WITH AXILLARY SENTINEL LYMPH NODE BIOPSY Left 08/20/2014   Procedure: LEFT BREAST LUMPECTOMY WITH RADIOACTIVE SEED LOCALIZATION AND SENTINEL LYMPH NODE MAPPING;  Surgeon: Erroll Luna, MD;  Location: [place];  Service: General;  Laterality: Left;   RE-EXCISION OF BREAST LUMPECTOMY Left 09/17/2014   Procedure: RE-EXCISION OF BREAST LUMPECTOMY;  Surgeon: Erroll Luna, MD;  Location: [place];  Service: General;  Laterality: Left;   TOTAL HIP ARTHROPLASTY Right 02/22/2021   Procedure: RIGHT TOTAL HIP ARTHROPLASTY ANTERIOR APPROACH;  Surgeon: Marybelle Killings, MD;  Location: [place];  Service: Orthopedics;  Laterality: Right;   TUBAL LIGATION      FAMILY HISTORY: family history includes Alzheimer's disease in her maternal grandmother; COPD in  her paternal grandfather; Cancer in her cousin, cousin, and paternal aunt; Colon cancer in her brother and cousin; Congestive Heart Failure in her paternal aunt; Diabetes in her mother; Emphysema in her paternal grandfather; Heart attack in her maternal grandfather and paternal grandmother; Kidney cancer in her maternal uncle; Melanoma (age of onset: 99) in her sister; Other in her father and sister; Ovarian cancer (age of onset: 52) in her mother; Stomach cancer in her paternal uncle.  SOCIAL HISTORY:  reports that she has quit smoking. Her smoking use included cigarettes. She has a 42.00 pack-year smoking history. She has never used smokeless tobacco. She reports that she does not drink alcohol and does not use drugs.  ALLERGIES: Patient has no known allergies.  MEDICATIONS:  Current Outpatient Medications  Medication Sig Dispense Refill   ibuprofen (ADVIL) 200 MG tablet Take 800 mg by mouth every 8 (eight) hours as needed for moderate pain.     albuterol (PROVENTIL HFA;VENTOLIN HFA) 108 (90 BASE) MCG/ACT inhaler Inhale 2 puffs into the lungs every 6 (six) hours as needed for wheezing or shortness of breath.      ALPRAZolam (XANAX) 0.5 MG tablet Take 0.5 mg by mouth 3 (three) times daily as needed for anxiety.      ANORO ELLIPTA 62.5-25 MCG/INH AEPB Inhale 1 puff into the lungs daily as needed (SOB).     aspirin EC 325 MG EC tablet Take 1 tablet (325 mg total) by mouth daily with breakfast. Must take at least 4 weeks postop for DVT prophylaxis (Patient not taking: Reported on 04/15/2021) 30 tablet 0   calcium carbonate (TUMS - DOSED IN MG ELEMENTAL CALCIUM) 500 MG chewable tablet Chew 1 tablet by mouth 3 (three) times daily as needed for indigestion or heartburn. Reported on 12/07/2015     cephALEXin (KEFLEX) 500 MG capsule Take 1 capsule (500 mg total) by mouth 4 (four) times daily. 60 capsule 0   furosemide (LASIX) 20 MG tablet Take 20 mg by mouth daily.     hydrochlorothiazide (HYDRODIURIL) 12.5  MG tablet Take 12.5 mg by mouth daily.     HYDROcodone-acetaminophen (NORCO/VICODIN) 5-325 MG tablet Take 1 tablet by mouth every 6 (six) hours as needed for severe pain. (Patient not taking: Reported on 07/30/2021) 50 tablet 0   pantoprazole (PROTONIX) 40 MG tablet Take 1 tablet by mouth once daily 90 tablet 0   potassium citrate (UROCIT-K) 10 MEQ (1080 MG) SR tablet Take 10 mEq by mouth daily.     sertraline (ZOLOFT) 100 MG tablet Take 100 mg by mouth daily.     traMADol (ULTRAM) 50 MG tablet TAKE 1 TABLET BY MOUTH EVERY 6 HOURS AS NEEDED (Patient not taking: Reported on 07/30/2021) 30 tablet 0   No current facility-administered medications for this encounter.    REVIEW OF SYSTEMS:  Notable for that above.   PHYSICAL EXAM:  height is '5\' 4"'  (1.626 m) and weight is 216 lb 3.2 oz (98.1 kg). Her temperature is 97.8 F (36.6 C). Her blood pressure is 149/72 (abnormal) and her pulse is 58 (abnormal). Her respiration is 20 and oxygen saturation is 98%.   General: Alert and oriented, in no acute distress HEENT: Head is normocephalic. Extraocular movements are intact. Oropharynx is notable for no lesions. Dentures removed. Neck: Neck is notable for no palpable masses Heart: Regular in rate and rhythm  with no murmurs, rubs, or gallops. Chest: Clear to auscultation bilaterally, with no rhonchi, wheezes, or rales. Abdomen: Soft, nontender, nondistended, with no rigidity or guarding. Extremities: +ankle edema. Lymphatics: see Neck Exam Skin: No concerning lesions. Musculoskeletal: symmetric strength and muscle tone throughout. Neurologic: Cranial nerves II through XII are grossly intact. No obvious focalities. Speech is fluent. Coordination is intact. Psychiatric: Judgment and insight are intact. Affect is appropriate.  PROCEDURE NOTE: After obtaining consent and spraying nasal cavity with topical oxymetazoline, the flexible endoscope was coated with lidocaine gel and introduced and passed through the  nasal cavity.  The nasopharynx, oropharynx, hypopharynx, and larynx  were then examined. Right false cord notable for an exophytic lesion.  No laryngeal paralysis appreciate. I could not see the right true cord due to tumor location. She tolerated procedure well.  ECOG = 0  0 - Asymptomatic (Fully active, able to carry on all predisease activities without restriction)  1 - Symptomatic but completely ambulatory (Restricted in physically strenuous activity but ambulatory and able to carry out work of a light or sedentary nature. For example, light housework, office work)  2 - Symptomatic, <50% in bed during the day (Ambulatory and capable of all self care but unable to carry out any work activities. Up and about more than 50% of waking hours)  3 - Symptomatic, >50% in bed, but not bedbound (Capable of only limited self-care, confined to bed or chair 50% or more of waking hours)  4 - Bedbound (Completely disabled. Cannot carry on any self-care. Totally confined to bed or chair)  5 - Death   Eustace Pen MM, Creech RH, Tormey DC, et al. [phone]). "Toxicity and response criteria of the [facility]". [place] Oncol. 5 (6): 649-55   LABORATORY DATA:  Lab Results  Component Value Date   WBC 12.9 (H) 02/24/2021   HGB 12.4 02/24/2021   HCT 35.8 (L) 02/24/2021   MCV 92.7 02/24/2021   PLT 174 02/24/2021   CMP     Component Value Date/Time   NA 136 02/26/2021 1013   K 3.5 02/26/2021 1013   CL 96 (L) 02/26/2021 1013   CO2 32 02/26/2021 1013   GLUCOSE 139 (H) 02/26/2021 1013   BUN 15 02/26/2021 1013   CREATININE 0.90 07/23/2021 0842   CALCIUM 8.9 02/26/2021 1013   PROT 6.4 (L) 02/18/2021 1530   ALBUMIN 3.4 (L) 02/18/2021 1530   AST 16 02/18/2021 1530   ALT 16 02/18/2021 1530   ALKPHOS 68 02/18/2021 1530   BILITOT 1.2 02/18/2021 1530   GFRNONAA >60 02/26/2021 1013   GFRAA >60 01/29/2020 1156      No results found for: TSH   RADIOGRAPHY: CT CHEST W  CONTRAST  Result Date: 07/24/2021 CLINICAL DATA:  New diagnosis glottic cancer, chest staging, additional history of left breast cancer status post lumpectomy, chemotherapy, and radiation. EXAM: CT CHEST WITH CONTRAST TECHNIQUE: Multidetector CT imaging of the chest was performed during intravenous contrast administration. CONTRAST:  30m OMNIPAQUE IOHEXOL 350 MG/ML SOLN COMPARISON:  None. FINDINGS: Cardiovascular: Scattered aortic atherosclerosis. Normal heart size. Left and right coronary artery calcifications. No pericardial effusion. Mediastinum/Nodes: Multiple enlarged mediastinal, bilateral hilar, and left supraclavicular lymph nodes, largest, partially necrotic appearing pretracheal nodes measuring up to 3.5 x 2.9 cm (series 2, image 47). Enlarged, multinodular right lobe of the thyroid measuring at least 4.4 x 3.4 cm (series 2, image 7). Trachea, and esophagus demonstrate no significant findings. Lungs/Pleura: Moderate centrilobular emphysema. Diffuse bilateral bronchial wall thickening.  Multiple small bilateral pulmonary nodules, largest in the anterior left lower lobe measuring 1.1 x 0.7 cm (series 5, image 79). Additional smaller nodules scattered throughout measure no greater than 0.4 cm, a subpleural nodule of the medial right lower lobe (series 5, image 64). No pleural effusion or pneumothorax. Upper Abdomen: No acute abnormality. Musculoskeletal: No chest wall mass or suspicious bone lesions identified. Status post left lumpectomy. IMPRESSION: 1. Multiple enlarged mediastinal, bilateral hilar, and left supraclavicular lymph nodes, consistent with nodal metastatic disease. 2. Multiple small bilateral pulmonary nodules, largest in the anterior left lower lobe measuring 1.1 x 0.7 cm, suspicious for pulmonary metastatic disease. 3. Additional smaller nodules scattered throughout measure no greater than 0.4 cm. These are nonspecific and some or all may be infectious or inflammatory although pulmonary  metastatic disease is not excluded. 4. Enlarged, multinodular right lobe of the thyroid. Recommend thyroid ultrasound if and when clinically appropriate given presumed metastatic malignancy (ref: J Am Coll Radiol. 2015 Feb;12(2): 143-50). 5. Emphysema. 6. Coronary artery disease. Aortic Atherosclerosis (ICD10-I70.0) and Emphysema (ICD10-J43.9). Electronically Signed   By: Delanna Ahmadi M.D.   On: 07/24/2021 18:20   NM PET Image Initial (PI) Skull Base To Thigh (F-18 FDG)  Result Date: 07/26/2021 CLINICAL DATA:  Initial treatment strategy for glottic carcinoma. Prior history of breast cancer. EXAM: NUCLEAR MEDICINE PET SKULL BASE TO THIGH TECHNIQUE: 10.3 mCi F-18 FDG was injected intravenously. Full-ring PET imaging was performed from the skull base to thigh after the radiotracer. CT data was obtained and used for attenuation correction and anatomic localization. Fasting blood glucose: 106 mg/dl COMPARISON:  Neck CT 06/23/2021 and chest CT 07/23/2021 FINDINGS: Mediastinal blood pool activity: SUV max 3.60 Liver activity: SUV max NA NECK: The right glottic lesion is mildly hypermetabolic with SUV max of 9.37. Mild symmetric hypermetabolism in the tonsillar regions bilaterally likely lymphoid tissue. There is a small right level 2 lymph node measuring 7 mm on image 38/4. SUV max is 2.75. Incidental CT findings: Right thyroid goiter but no hypermetabolism is suggest thyroid cancer. CHEST: Left-sided subclavicular node on image 52/4 measures 10 mm and has an SUV max of 8.81. Upper right paratracheal node measuring 14 mm on image 63/4 has an SUV max of 9.88 12 mm prevascular node on image 72/4 has an SUV max of 11.91. Subcarinal nodal mass measures approximately 22 mm on image number 81/4 and has an SUV max of 15.38 Left infrahilar node on image 81/4 measures 15 mm and has an SUV max of 12.32 Left lower lobe pulmonary nodule better seen on the prior chest CT is hypermetabolic with SUV max of 9.02. No breast masses or  axillary adenopathy. Incidental CT findings: Stable aortic and coronary artery calcifications. Stable underlying emphysematous changes. ABDOMEN/PELVIS: No abnormal hypermetabolic activity within the liver, pancreas, adrenal glands, or spleen. No hypermetabolic lymph nodes in the abdomen or pelvis. Incidental CT findings: Scattered aortic calcifications but no aneurysm. SKELETON: No findings suspicious for osseous metastatic disease. Moderate uptake noted around the right hip prosthesis. Incidental CT findings: none IMPRESSION: 1. Small right glottic lesion is mildly hypermetabolic and consistent with known neoplasm. 2. Borderline right level 2 lymph node. 3. Left supraclavicular, mediastinal and hilar lymphadenopathy. 4. Hypermetabolic left lower lobe pulmonary nodule likely metastatic focus. 5. No findings for abdominal/pelvic metastatic disease or osseous metastatic disease. Electronically Signed   By: Marijo Sanes M.D.   On: 07/26/2021 15:46      IMPRESSION/PLAN:  This is a delightful patient with head and neck  cancer.  She appears to have early stage supraglottic laryngeal cancer but also has lymphadenopathy throughout the chest.     It looks like she may have a thyroid malignancy (pending bx soon) and maybe a third malignancy. She has a previous hx of breast cancer, so the nodes in the chest may be recurrence of that. I sent a message today to Dr Lamonte Sakai requesting that he perform bronchoscopy to determine the etiology of the nodes in her chest. This will help Dr. Chryl Heck  give her the appropriate systemic treatment.  Pt wishes to see Dr Chryl Heck as scheduled rather than resume med onc care at [facility].  I do recommend radiotherapy for this patient's supraglottic cancer, and given her multiple issues, I think the  best approach would be a 4 week hypofractionated course to the larynx and strategic echelons of the neck.  We discussed the potential risks, benefits, and side effects of radiotherapy. We talked  in detail about acute and late effects. We discussed that some of the most bothersome acute effects may be mucositis, dysgeusia, salivary changes, skin irritation, hair loss, dehydration, weight loss and fatigue. We talked about late effects which include but are not necessarily limited to dysphagia, hypothyroidism, nerve injury, vascular injury, spinal cord injury, xerostomia, trismus, neck edema, and potential injury to any of the tissues in the head and neck region. No guarantees of treatment were given. A consent form was signed and placed in the patient's medical record. The patient is enthusiastic about proceeding with treatment. I look forward to participating in the patient's care.    Simulation (treatment planning) will take place after her workup is complete, depending on timing of biopsies. I may need to start her radiation therapy before her chest biopsies are complete, if they cannot be scheduled quickly.  We also discussed that the treatment of head and neck cancer is a multidisciplinary process to maximize treatment outcomes and quality of life. For this reason the following referrals have been or will be made:   Medical oncology to discuss chemotherapy    Nutritionist for nutrition support during and after treatment.   Speech language pathology for swallowing and/or speech therapy.   Social work for social support.    Physical therapy due to risk of lymphedema in neck and deconditioning.   Baseline labs including TSH.  On date of service, in total, I spent 60 minutes on this encounter. Patient was seen in person.  __________________________________________   Eppie Gibson, MD  This document serves as a record of services personally performed by Eppie Gibson, MD. It was created on her behalf by Roney Mans, a trained medical scribe. The creation of this record is based on the scribe's personal observations and the provider's statements to them. This document has been checked  and approved by the attending provider.

## 2021-07-29 NOTE — Progress Notes (Signed)
Subjective:    Patient ID: Regina Mcmahon, female    DOB: 1950-12-16, 71 y.o.   MRN: [id number]  HPI 71 year old active smoker (50 pack years) with a history of left breast cancer, COPD/asthma, hypertension, GERD, depression/anxiety.  She has been seen in our office before by Dr. Melvyn Novas.  Under recent evaluation by Dr. 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  PET scan was ordered and performed on 07/23/2021 as below.  CT chest 07/23/2021 reviewed by me, shows multiple bilateral mediastinal and hilar lymphadenopathy, left supraclavicular nodes.  Multiple small bilateral pulmonary nodules largest in the left lower lobe 1.1 x 0.7 cm consistent with metastatic disease, enlarged multinodular right thyroid nodule   Review of Systems As per HPi  Past Medical History:  Diagnosis Date   Asthma    Asymptomatic varicose veins    Breast cancer ([facility]) 06/2014   left   Chronic airway obstruction (HCC)    Complication of anesthesia    difficulty waking up   COPD (chronic obstructive pulmonary disease) (HCC)    Depression    GERD (gastroesophageal reflux disease)    Hemorrhage rectum    and anus.   Hypertension    Insomnia    Other symptoms involving digestive system(787.99)    Pain    Hx.pain in joint involving pelvic region and thigh; pain in limb   Palpitations    Panic attacks    Personal history of chemotherapy 2016   Personal history of radiation therapy 2016   Pre-diabetes    Sinusitis    Symptomatic menopausal or female climacteric states      Family History  Problem Relation Age of Onset   Diabetes Mother    Ovarian cancer Mother 83   Other Father        died in MVA at age 41   Other Sister        mitral valve disorder   Melanoma Sister 63       removed from back of leg   Colon cancer Brother        early 46s, succumbed to disease   Cancer Paternal Aunt        leukemia, bone, or lung  cancer   Alzheimer's disease Maternal Grandmother    Heart attack Maternal Grandfather    Heart attack Paternal Grandmother    COPD Paternal Grandfather    Emphysema Paternal Grandfather    Congestive Heart Failure Paternal Aunt    Stomach cancer Paternal Uncle    Kidney cancer Maternal Uncle    Cancer Cousin        dx. teens/dx. 59s   Cancer Cousin    Colon cancer Cousin      Social History   Socioeconomic History   Marital status: Divorced    Spouse name: Not on file   Number of children: 2   Years of education: Not on file   Highest education level: Not on file  Occupational History   Not on file  Tobacco Use   Smoking status: Every Day    Packs/day: 1.00    Years: 42.00    Pack years: 42.00    Types: Cigarettes   Smokeless tobacco: Never  Vaping Use   Vaping Use: Never used  Substance and Sexual Activity   Alcohol use: No    Alcohol/week: 0.0 standard drinks   Drug use: No   Sexual activity: Never    Birth control/protection:  None  Other Topics Concern   Not on file  Social History Narrative   Not on file   Social Determinants of Health   Financial Resource Strain: Not on file  Food Insecurity: Not on file  Transportation Needs: Not on file  Physical Activity: Not on file  Stress: Not on file  Social Connections: Not on file  Intimate Partner Violence: Not on file     No Known Allergies   Outpatient Medications Prior to Visit  Medication Sig Dispense Refill   albuterol (PROVENTIL HFA;VENTOLIN HFA) 108 (90 BASE) MCG/ACT inhaler Inhale 2 puffs into the lungs every 6 (six) hours as needed for wheezing or shortness of breath.      ALPRAZolam (XANAX) 0.5 MG tablet Take 0.5 mg by mouth 3 (three) times daily as needed for anxiety.      ANORO ELLIPTA 62.5-25 MCG/INH AEPB Inhale 1 puff into the lungs daily as needed (SOB).     calcium carbonate (TUMS - DOSED IN MG ELEMENTAL CALCIUM) 500 MG chewable tablet Chew 1 tablet by mouth 3 (three) times daily as needed  for indigestion or heartburn. Reported on 12/07/2015     cephALEXin (KEFLEX) 500 MG capsule Take 1 capsule (500 mg total) by mouth 4 (four) times daily. 60 capsule 0   furosemide (LASIX) 20 MG tablet Take 20 mg by mouth daily.     hydrochlorothiazide (HYDRODIURIL) 12.5 MG tablet Take 12.5 mg by mouth daily.     HYDROcodone-acetaminophen (NORCO/VICODIN) 5-325 MG tablet Take 1 tablet by mouth every 6 (six) hours as needed for severe pain. 50 tablet 0   pantoprazole (PROTONIX) 40 MG tablet Take 1 tablet by mouth once daily 90 tablet 0   potassium citrate (UROCIT-K) 10 MEQ (1080 MG) SR tablet Take 10 mEq by mouth daily.     sertraline (ZOLOFT) 100 MG tablet Take 100 mg by mouth daily.     traMADol (ULTRAM) 50 MG tablet TAKE 1 TABLET BY MOUTH EVERY 6 HOURS AS NEEDED 30 tablet 0   aspirin EC 325 MG EC tablet Take 1 tablet (325 mg total) by mouth daily with breakfast. Must take at least 4 weeks postop for DVT prophylaxis (Patient not taking: Reported on 04/15/2021) 30 tablet 0   gabapentin (NEURONTIN) 300 MG capsule TAKE 1 CAPSULE BY MOUTH THREE TIMES DAILY (Patient not taking: Reported on 07/29/2021) 90 capsule 0   No facility-administered medications prior to visit.        Objective:   Physical Exam Vitals:   07/29/21 1404  BP: 118/78  Pulse: 66  Temp: 97.9 F (36.6 C)  TempSrc: Oral  SpO2: 95%  Weight: 218 lb 12.8 oz (99.2 kg)  Height: 5\' 4"  (1.626 m)   Gen: Pleasant, well-nourished, in no distress,  normal affect  ENT: No lesions,  mouth clear,  oropharynx clear, no postnasal drip  Neck: No JVD, mild stridor, hoarse voice  Lungs: No use of accessory muscles, no crackles or wheezing on normal respiration, no wheeze on forced expiration  Cardiovascular: RRR, heart sounds normal, no murmur or gallops, no peripheral edema  Musculoskeletal: No deformities, no cyanosis or clubbing  Neuro: alert, awake, non focal  Skin: Warm, no lesions or rash      Assessment & Plan:   COPD GOLD  II  /  still smoking  Anoro as ordered   Abnormal CT of the chest  Complicated case.  She did not know anything about the details of the PET scan, so we reviewed all the  findings in detail today.  I believe that she will ultimately need a tissue diagnosis from mediastinal adenopathy, probably also with a small left pulmonary nodule that was hypermetabolic on PET scan.  She has not seen radiation oncology or oncology yet but presume they will need this data.  I will go ahead and start working on setting up bronchoscopy with EBUS and navigation.  Keep your scheduled visits with oncology and radiation oncology Get your thyroid needle biopsy as planned We will work on arranging for bronchoscopy to be done under general anesthesia as an outpatient at [facility] endoscopy.  You will need a designated driver.  You will need to be off your aspirin 2 days prior to the procedure.  We will try to get this arranged for 08/23/2021. We will coordinate with oncology and radiation oncology to ensure that they do need the information that we would obtain a bronchoscopy.  If they would like to initiate treatment without those biopsies then we will cancel the procedure. Follow with Dr Lamonte Sakai in 1 month   Baltazar Apo, MD, PhD 07/29/2021, 2:48 PM  Pulmonary and Critical Care [phone] or if no answer before 7:00PM call [phone] For any issues after 7:00PM please call eLink [phone]

## 2021-07-29 NOTE — Progress Notes (Signed)
Head and Neck Cancer Location of Tumor / Histology:  Malignant neoplasm of glottis, p16(+)  Patient presented with symptoms of: vocal hoarseness. Patient underwent hip replacement in early August. After the surgery, she noticed poor sense of taste. She started vomiting. She could not eat due to nausea, dry mouth, and lack of taste. About a month later, her sense of taste returned but her voice started sounding hoarse. The voice started raspy but has been severely hoarse since then. Her throat becomes uncomfortable if she tries to talk a lot  Biopsies revealed:  06/25/2021 Diagnosis Vocal cord, biopsy, Right false mass - SQUAMOUS CELL CARCINOMA. SEE COMMENT. - P16 IMMUNOSTAINING POSITIVE.  PET Scan 07/23/2021 --IMPRESSION: Small right glottic lesion is mildly hypermetabolic and consistent with known neoplasm. Borderline right level 2 lymph node. Left supraclavicular, mediastinal and hilar lymphadenopathy. Hypermetabolic left lower lobe pulmonary nodule likely metastatic focus. No findings for abdominal/pelvic metastatic disease or osseous metastatic disease  CT Chest w/ Contrast 07/23/2021 --IMPRESSION: Multiple enlarged mediastinal, bilateral hilar, and left supraclavicular lymph nodes, consistent with nodal metastatic disease. Multiple small bilateral pulmonary nodules, largest in the anterior left lower lobe measuring 1.1 x 0.7 cm, suspicious for pulmonary metastatic disease. Additional smaller nodules scattered throughout measure no greater than 0.4 cm. These are nonspecific and some or all may be infectious or inflammatory although pulmonary metastatic disease is not excluded. Enlarged, multinodular right lobe of the thyroid. Recommend thyroid ultrasound if and when clinically appropriate given presumed metastatic malignancy (ref: J Am Coll Radiol. 2015 Feb;12(2): 143-50). Emphysema. Coronary artery disease.  Nutrition Status Yes No Comments  Weight changes? '[x]'  '[]'  Reports  unintentional weight loss of ~30 lbs after her hip surgery due to loss of taste and frequent vomiting   Swallowing concerns? '[]'  '[x]'    PEG? '[]'  '[x]'     Referrals Yes No Comments  Social Work? '[x]'  '[]'    Dentistry? '[]'  '[x]'    Swallowing therapy? '[x]'  '[]'    Nutrition? '[x]'  '[]'    Med/Onc? '[x]'  '[]'     Safety Issues Yes No Comments  Prior radiation? '[x]'  '[]'   04/06/2015-05/21/2015 Left breast/ 45 Gy at 1.8 Gy per fraction x 25 fractions.  Left supraclavicular fossa and axilla/ 45 Gy at 1.8 Gy per fraction x 25 fractions Left breast boost/ 16 Gy at 2 Gy per fraction x 8 fractions  Pacemaker/ICD? '[]'  '[x]'    Possible current pregnancy? '[]'  '[x]'  Postmenopausal  Is the patient on methotrexate? '[]'  '[x]'     Tobacco/Marijuana/Snuff/ETOH use: Patient recently quit smoking (history 1 pack/day for 42 years). Never used smokeless tobacco. Denies any alcohol consumption or recreational drug use  Past/Anticipated interventions by otolaryngology, if any:  06/25/2021 --Dr. Melida Quitter Fine needle biopsy/aspiration of vocal cord  Past/Anticipated interventions by medical oncology, if any:  Scheduled for consultation with Dr. Benay Pike on 08/06/2021  Current Complaints / other details:  Scheduled for biopsy of thyroid on 08/04/2021. Met with Dr. Baltazar Apo yesterday to discuss CT chest findings and possible bronchoscopy

## 2021-07-29 NOTE — Assessment & Plan Note (Signed)
Anoro as ordered

## 2021-07-30 ENCOUNTER — Ambulatory Visit
Admission: RE | Admit: 2021-07-30 | Discharge: 2021-07-30 | Disposition: A | Discharge status: Home / Self Care | Payer: Medicare HMO | Source: Ambulatory Visit | Attending: Radiation Oncology | Admitting: Radiation Oncology

## 2021-07-30 ENCOUNTER — Encounter: Payer: Self-pay | Admitting: Radiation Oncology

## 2021-07-30 VITALS — BP 149/72 | HR 58 | Temp 97.8°F | Resp 20 | Ht 64.0 in | Wt 216.2 lb

## 2021-07-30 DIAGNOSIS — K219 Gastro-esophageal reflux disease without esophagitis: Secondary | ICD-10-CM | POA: Insufficient documentation

## 2021-07-30 DIAGNOSIS — Z9221 Personal history of antineoplastic chemotherapy: Secondary | ICD-10-CM | POA: Insufficient documentation

## 2021-07-30 DIAGNOSIS — R69 Illness, unspecified: Secondary | ICD-10-CM | POA: Diagnosis not present

## 2021-07-30 DIAGNOSIS — Z800 Family history of malignant neoplasm of digestive organs: Secondary | ICD-10-CM | POA: Insufficient documentation

## 2021-07-30 DIAGNOSIS — C321 Malignant neoplasm of supraglottis: Secondary | ICD-10-CM | POA: Diagnosis not present

## 2021-07-30 DIAGNOSIS — R490 Dysphonia: Secondary | ICD-10-CM | POA: Insufficient documentation

## 2021-07-30 DIAGNOSIS — R112 Nausea with vomiting, unspecified: Secondary | ICD-10-CM | POA: Insufficient documentation

## 2021-07-30 DIAGNOSIS — I2510 Atherosclerotic heart disease of native coronary artery without angina pectoris: Secondary | ICD-10-CM | POA: Diagnosis not present

## 2021-07-30 DIAGNOSIS — Z923 Personal history of irradiation: Secondary | ICD-10-CM | POA: Diagnosis not present

## 2021-07-30 DIAGNOSIS — C320 Malignant neoplasm of glottis: Secondary | ICD-10-CM | POA: Diagnosis not present

## 2021-07-30 DIAGNOSIS — Z79899 Other long term (current) drug therapy: Secondary | ICD-10-CM | POA: Diagnosis not present

## 2021-07-30 DIAGNOSIS — R682 Dry mouth, unspecified: Secondary | ICD-10-CM | POA: Diagnosis not present

## 2021-07-30 DIAGNOSIS — Z8249 Family history of ischemic heart disease and other diseases of the circulatory system: Secondary | ICD-10-CM | POA: Diagnosis not present

## 2021-07-30 DIAGNOSIS — Z833 Family history of diabetes mellitus: Secondary | ICD-10-CM | POA: Diagnosis not present

## 2021-07-30 DIAGNOSIS — F32A Depression, unspecified: Secondary | ICD-10-CM | POA: Insufficient documentation

## 2021-07-30 DIAGNOSIS — C778 Secondary and unspecified malignant neoplasm of lymph nodes of multiple regions: Secondary | ICD-10-CM | POA: Insufficient documentation

## 2021-07-30 DIAGNOSIS — Z8041 Family history of malignant neoplasm of ovary: Secondary | ICD-10-CM | POA: Diagnosis not present

## 2021-07-30 DIAGNOSIS — Z853 Personal history of malignant neoplasm of breast: Secondary | ICD-10-CM | POA: Diagnosis not present

## 2021-07-30 DIAGNOSIS — R590 Localized enlarged lymph nodes: Secondary | ICD-10-CM | POA: Diagnosis not present

## 2021-07-30 DIAGNOSIS — Z8719 Personal history of other diseases of the digestive system: Secondary | ICD-10-CM | POA: Diagnosis not present

## 2021-07-30 DIAGNOSIS — J432 Centrilobular emphysema: Secondary | ICD-10-CM | POA: Diagnosis not present

## 2021-07-30 DIAGNOSIS — I10 Essential (primary) hypertension: Secondary | ICD-10-CM | POA: Insufficient documentation

## 2021-07-30 DIAGNOSIS — Z9049 Acquired absence of other specified parts of digestive tract: Secondary | ICD-10-CM | POA: Diagnosis not present

## 2021-07-30 DIAGNOSIS — Z8051 Family history of malignant neoplasm of kidney: Secondary | ICD-10-CM | POA: Diagnosis not present

## 2021-07-30 DIAGNOSIS — I700 Atherosclerosis of aorta: Secondary | ICD-10-CM | POA: Insufficient documentation

## 2021-07-30 DIAGNOSIS — Z87891 Personal history of nicotine dependence: Secondary | ICD-10-CM | POA: Insufficient documentation

## 2021-07-30 DIAGNOSIS — Z818 Family history of other mental and behavioral disorders: Secondary | ICD-10-CM | POA: Insufficient documentation

## 2021-07-30 MED ORDER — OXYMETAZOLINE HCL 0.05 % NA SOLN
2.0000 | Freq: Once | NASAL | Status: AC
  Administered 2021-07-30: 2 via NASAL
  Filled 2021-07-30: qty 30

## 2021-07-30 NOTE — Progress Notes (Signed)
Oncology Nurse Navigator Documentation   Met with patient during initial consult with Dr/ Isidore Moos. She was accompanied by her sister.  Further introduced myself as her/their Navigator, explained my role as a member of the Care Team. Provided New Patient Information binder: Contact information for physician, this navigator, other members of the Care Team Advance Directive information ([place] blue pamphlet with LCSW insert);  Fall Prevention Patient Safety Plan Financial Assistance Information sheet Symptom Management Clinic information [place] with highlight of [place] SLP Information sheet Assisted with post-consult appt scheduling. They verbalized understanding of information provided. I encouraged them to call with questions/concerns moving forward.  Harlow Asa, RN, BSN, OCN Head & Neck Oncology Nurse [place] [facility] [place] [phone]

## 2021-07-30 NOTE — Telephone Encounter (Signed)
Spoke to pt & gave her appt info.

## 2021-08-03 ENCOUNTER — Other Ambulatory Visit: Payer: Self-pay

## 2021-08-03 DIAGNOSIS — R5383 Other fatigue: Secondary | ICD-10-CM

## 2021-08-03 DIAGNOSIS — C321 Malignant neoplasm of supraglottis: Secondary | ICD-10-CM

## 2021-08-03 DIAGNOSIS — R5381 Other malaise: Secondary | ICD-10-CM

## 2021-08-04 ENCOUNTER — Telehealth: Payer: Self-pay | Admitting: Hematology and Oncology

## 2021-08-04 ENCOUNTER — Telehealth: Payer: Self-pay | Admitting: General Practice

## 2021-08-04 ENCOUNTER — Ambulatory Visit
Admission: RE | Admit: 2021-08-04 | Discharge: 2021-08-04 | Disposition: A | Discharge status: Home / Self Care | Payer: Medicare HMO | Source: Ambulatory Visit | Attending: Otolaryngology | Admitting: Otolaryngology

## 2021-08-04 ENCOUNTER — Other Ambulatory Visit (HOSPITAL_COMMUNITY)
Admission: RE | Admit: 2021-08-04 | Discharge: 2021-08-04 | Disposition: A | Discharge status: Home / Self Care | Payer: Medicare HMO | Source: Ambulatory Visit | Attending: Otolaryngology | Admitting: Otolaryngology

## 2021-08-04 DIAGNOSIS — E041 Nontoxic single thyroid nodule: Secondary | ICD-10-CM | POA: Diagnosis not present

## 2021-08-04 IMAGING — US US FNA BIOPSY THYROID 1ST LESION
1 series · 12 of 12 positions shown · non-contrast
Comparison: US [DATE]

MEDICATIONS:
5 cc 1% lidocaine

COMPLICATIONS:
None immediate.

INDICATION: Right thyroid nodule

5.0 cm
Hx Breast Cancer
New Glottic Cancer
EXAM:
ULTRASOUND GUIDED FINE NEEDLE ASPIRATION OF INDETERMINATE THYROID
NODULE
TECHNIQUE: Informed written consent was obtained from the patient after a
discussion of the risks, benefits and alternatives to treatment.
Questions regarding the procedure were encouraged and answered. A
timeout was performed prior to the initiation of the procedure.

[Series 1: us fna biopsy thyroid 1st lesion · 0.07mm/px · 12 acquisitions, 12 frames shown]
[im 1/12]
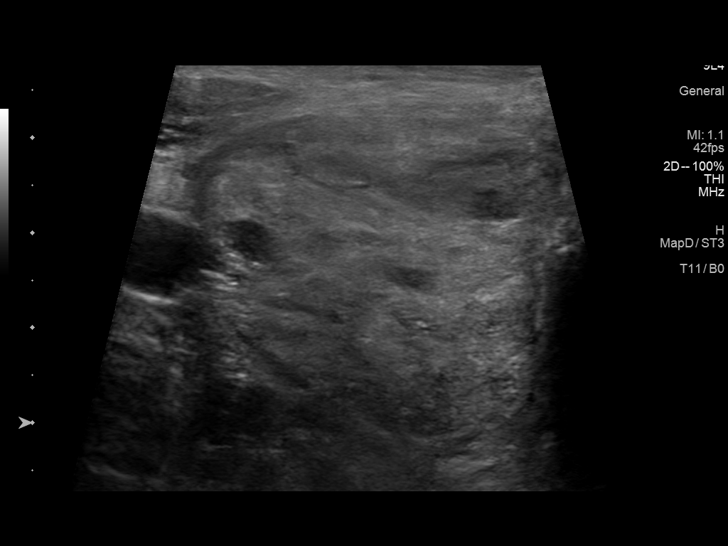
[im 2/12]
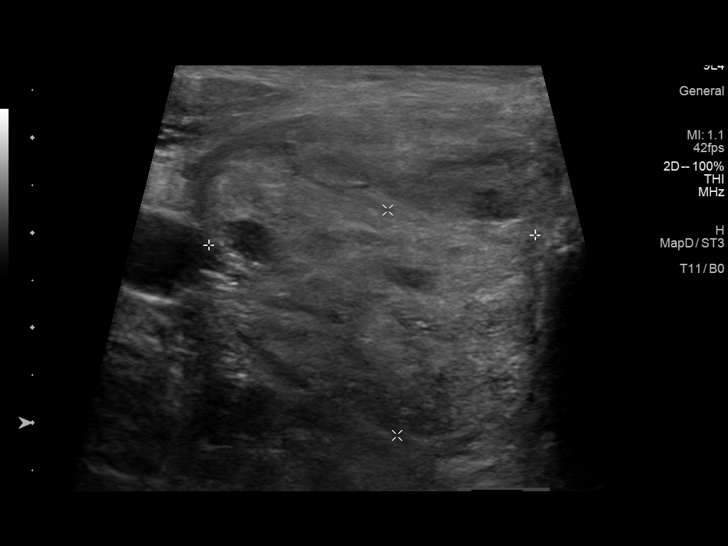
[im 3/12]
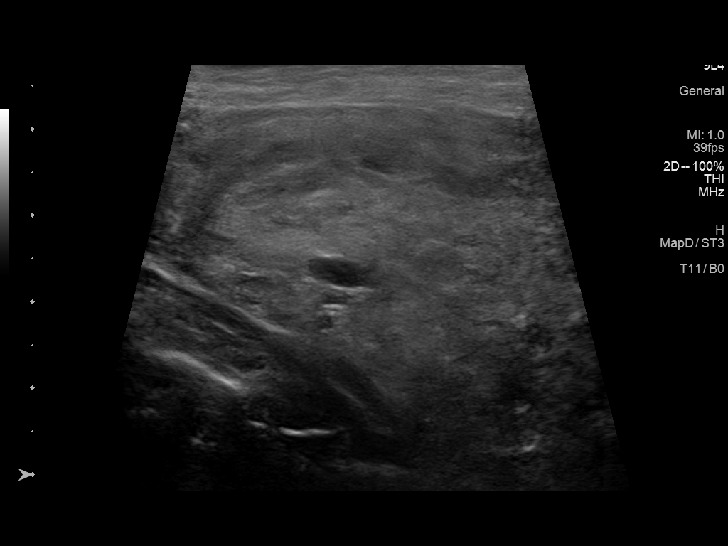
[im 4/12]
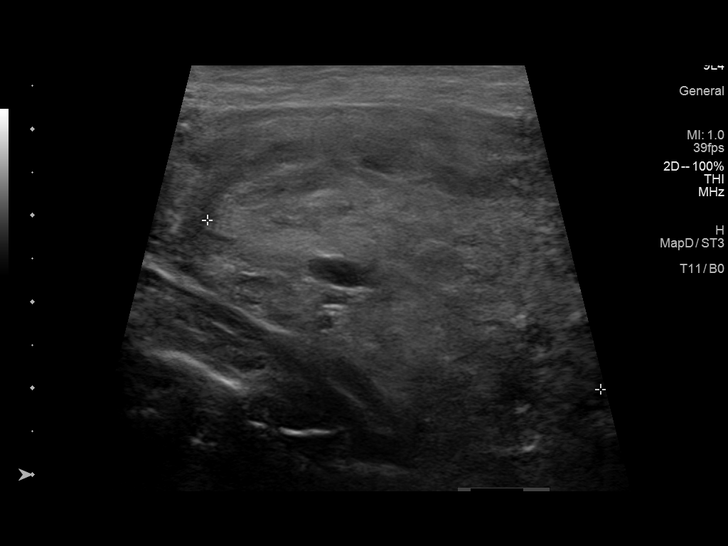
[im 5/12]
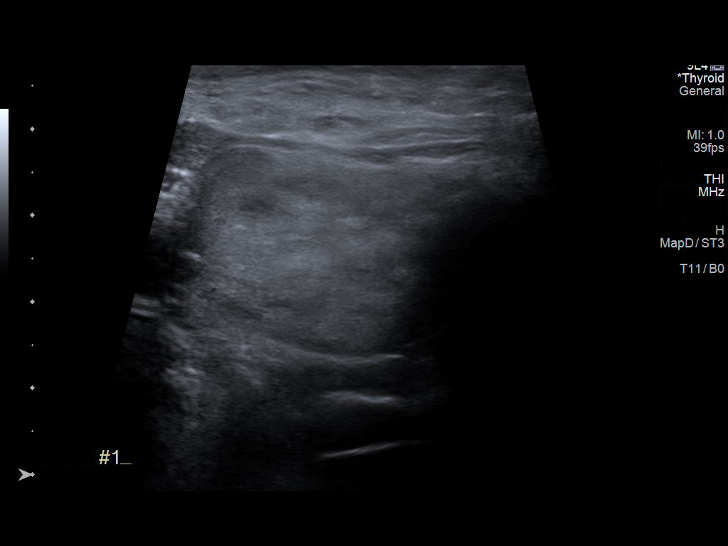
[im 6/12]
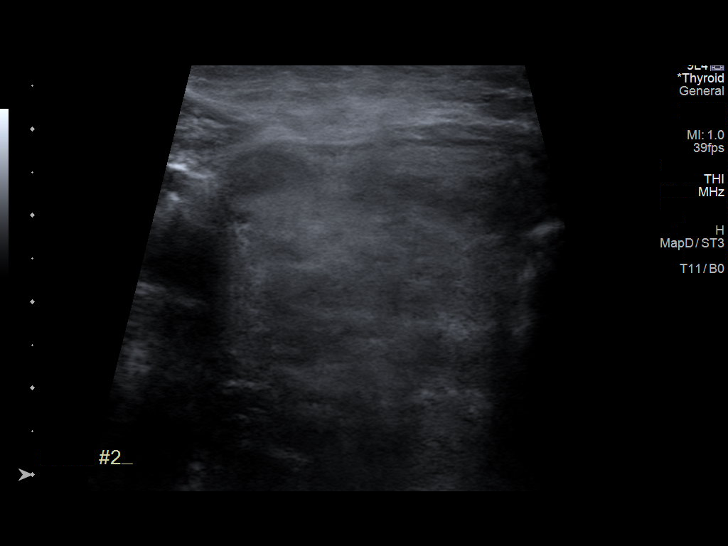
[im 7/12]
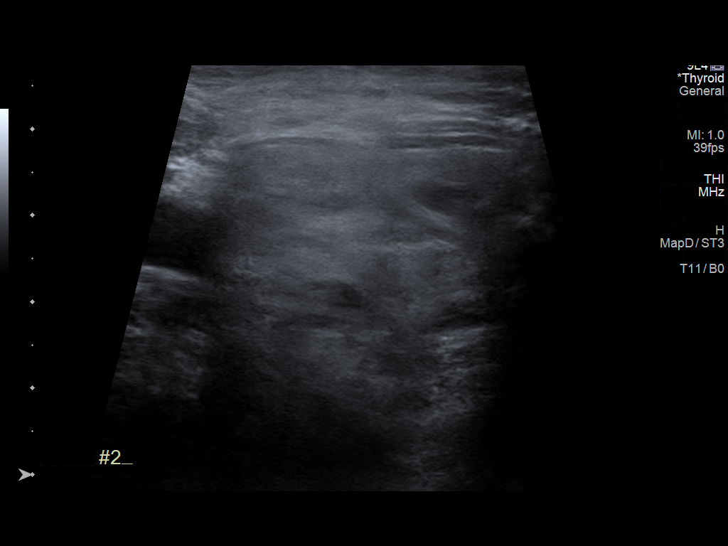
[im 8/12]
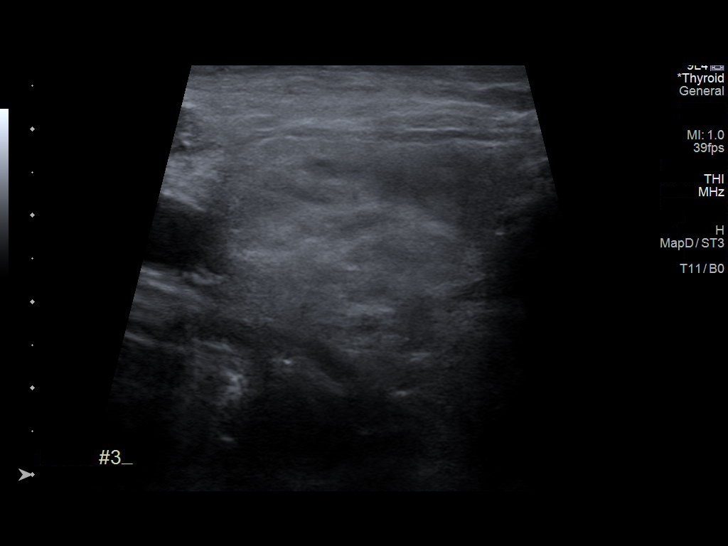
[im 9/12]
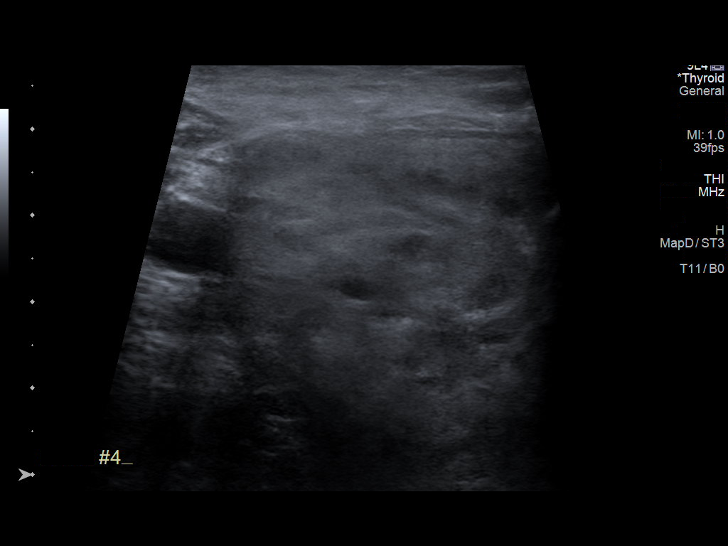
[im 10/12]
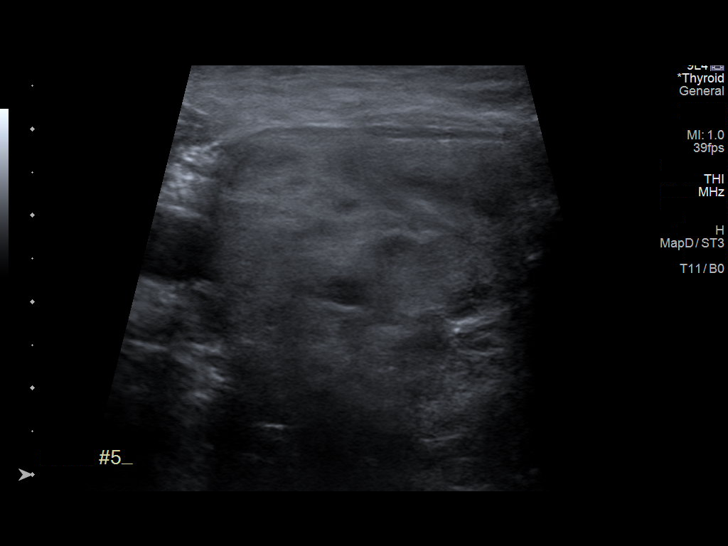
[im 11/12]
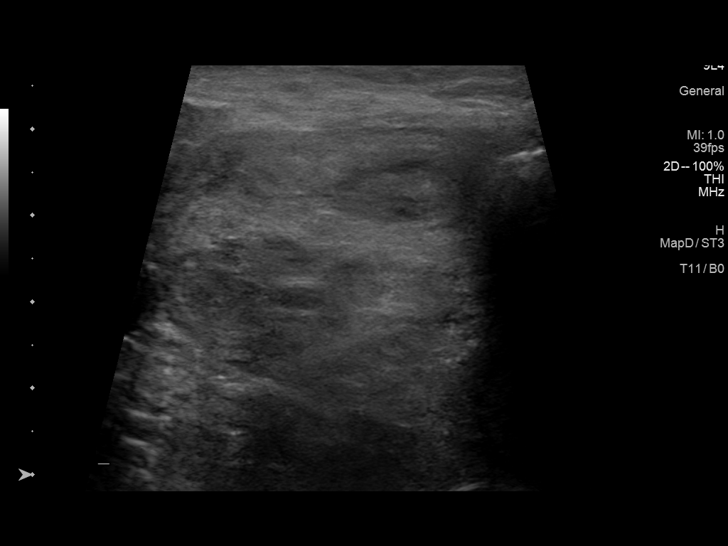
[im 12/12]
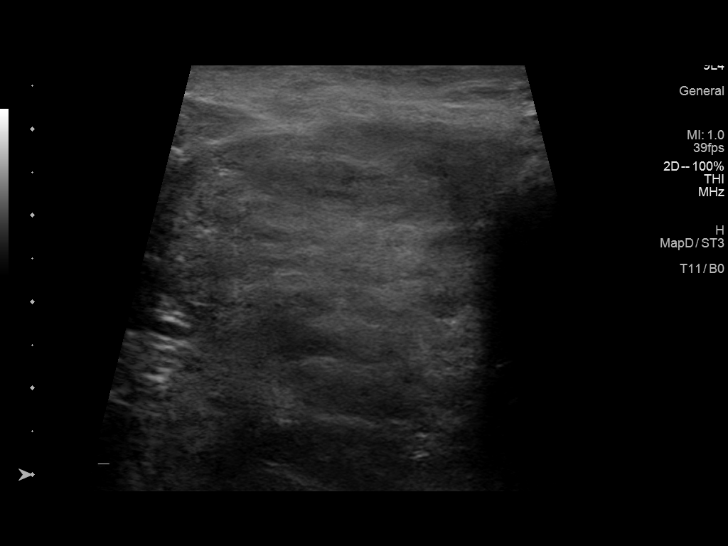

[12 of 12 positions shown; findings below may reference images not displayed]

Pre-procedural ultrasound scanning demonstrated unchanged size and
appearance of the indeterminate nodule within the right thyroid

The procedure was planned. The neck was prepped in the usual sterile
fashion, and a sterile drape was applied covering the operative
field. A timeout was performed prior to the initiation of the
procedure. Local anesthesia was provided with 1% lidocaine.

Under direct ultrasound guidance, 5 FNA biopsies were performed of
the right mid lobe thyroid nodule with a 27 gauge needle.

2 of these samples were obtained for AFIRMA

Multiple ultrasound images were saved for procedural documentation
purposes. The samples were prepared and submitted to pathology.

Limited post procedural scanning was negative for hematoma or
additional complication. Dressings were placed. The patient
tolerated the above procedures procedure well without immediate
postprocedural complication.
FINDINGS: Nodule reference number based on prior diagnostic ultrasound: 1

Maximum size: 5.0 cm

Location: Right; Mid

ACR TI-RADS risk category: TR3 (3 points)

Reason for biopsy: meets ACR TI-RADS criteria

Ultrasound imaging confirms appropriate placement of the needles
within the thyroid nodule.
IMPRESSION: Technically successful ultrasound guided fine needle aspiration of
right mid lobe thyroid nodule

Read by

BENRABAH

## 2021-08-04 NOTE — Telephone Encounter (Signed)
Scheduled appointment per 1/10 scheduling message. Patient is aware.

## 2021-08-04 NOTE — Telephone Encounter (Signed)
[place] CSW Progress Notes  Called patient, per referral.  Patient in procedure. Spoke w sister, Hilda Blades.  Will call patient tomorrow.  Edwyna Shell, LCSW Clinical Social Worker Phone:  [phone]

## 2021-08-05 ENCOUNTER — Encounter: Payer: Self-pay | Admitting: General Practice

## 2021-08-05 LAB — CYTOLOGY - NON PAP

## 2021-08-05 NOTE — Progress Notes (Signed)
[place] Work  Initial Assessment   Regina Mcmahon is a 71 y.o. year old female contacted by phone. Clinical Social Work was referred by radiation oncologist for assessment of psychosocial needs.   SDOH (Social Determinants of Health) assessments performed: Yes   Distress Screen completed: Yes ONCBCN DISTRESS SCREENING 10/02/2014  Screening Type Initial Screening  Distress experienced in past week (1-10) 9  Practical problem type (No Data)  Family Problem type -  Emotional problem type Depression;Nervousness/Anxiety;Adjusting to illness;Isolation/feeling alone;Adjusting to appearance changes  Spiritual/Religous concerns type Facing my mortality;Loss of sense of purpose  Information Concerns Type -  Physical Problem type -  Physician notified of physical symptoms Yes  Referral to clinical social work Yes  Referral to dietition No  Referral to financial advocate Yes  Referral to palliative care (No Data)  Other -      Family/Social Information:  Housing Arrangement: has recently been approved for a senior apartment, will move in as soon as she can, there is a vacancy.  "It worries me because Im afraid that when I start taking the radiation, I wont feel up to moving."  patient lives alone in her own trailer "that is about to fall in". She would like to sell trailer so she would be able to move into the apartment.   Family members/support persons in your life? Family; has friends and sister and family who can help her.  Son lives in [place].  Ex husband died in August 07, 2020.   Transportation concerns: no, family helps transport.  She can also drive as needed.   Employment: Retired. Was taken out of work at age 59, was on disability prior to retirement.  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  Worked w "a lot of carcinogenic chemicals".  Wonders if this has impacted her cancer history.   Income source: Paediatric nurse concerns: No Type  of concern: None Food access concerns: no Religious or spiritual practice: yes, member of church and attends occasionally Medication Concerns: no  Services Currently in place:  wonders if her [employer] would cover "a nurse to come and check on me if I needed it."   Coping/ Adjustment to diagnosis: Patient understands treatment plan and what happens next? yes, newly diagnosed with laryngeal cancer, past history of breast cancer, other areas of concern being investigated.  This episode of cancer arose after she noticed changes in her sense of taste/vomiting/nausea post hip replacement surgery in August 2022.  "I have so many questions about why they did not find it earlier."   Concerns about diagnosis and/or treatment:  fatigue during radiation treatment when she wants to move into senior housing, side effects like nausea, hair loss.  Patient reported stressors: Housing and would like to move into senior apartment which is available to her.   Hopes and priorities: "when I'm in pain, I want medication", would like to be sure pain is adequately treated.  Wants to "fight".  Admits that she does get "depressed, especially being her by myself, especially during the holidays." Current coping skills/ strengths: Supportive family/friends     SUMMARY: Current SDOH Barriers:  Housing barriers  Interventions: Discussed common feeling and emotions when being diagnosed with cancer, and the importance of support during treatment Informed patient of the support team roles and support services at [facility] Provided CSW contact information and encouraged patient to call with any questions or concerns   Follow Up Plan: Patient will contact CSW with  any support or resource needs Patient verbalizes understanding of plan: No    Beverely Pace , [place], [place] Worker Phone:  [phone]

## 2021-08-06 ENCOUNTER — Other Ambulatory Visit: Payer: Self-pay

## 2021-08-06 ENCOUNTER — Encounter: Payer: Self-pay | Admitting: Hematology and Oncology

## 2021-08-06 ENCOUNTER — Inpatient Hospital Stay: Payer: Medicare HMO | Attending: Hematology and Oncology | Admitting: Hematology and Oncology

## 2021-08-06 ENCOUNTER — Ambulatory Visit
Admission: RE | Admit: 2021-08-06 | Discharge: 2021-08-06 | Disposition: A | Discharge status: Home / Self Care | Payer: Medicare HMO | Source: Ambulatory Visit | Attending: Radiation Oncology | Admitting: Radiation Oncology

## 2021-08-06 VITALS — BP 156/72 | HR 59 | Temp 97.9°F | Resp 17 | Wt 217.7 lb

## 2021-08-06 DIAGNOSIS — C320 Malignant neoplasm of glottis: Secondary | ICD-10-CM | POA: Insufficient documentation

## 2021-08-06 DIAGNOSIS — J449 Chronic obstructive pulmonary disease, unspecified: Secondary | ICD-10-CM | POA: Diagnosis not present

## 2021-08-06 DIAGNOSIS — Z833 Family history of diabetes mellitus: Secondary | ICD-10-CM | POA: Diagnosis not present

## 2021-08-06 DIAGNOSIS — Z808 Family history of malignant neoplasm of other organs or systems: Secondary | ICD-10-CM | POA: Diagnosis not present

## 2021-08-06 DIAGNOSIS — Z170 Estrogen receptor positive status [ER+]: Secondary | ICD-10-CM | POA: Diagnosis not present

## 2021-08-06 DIAGNOSIS — Z8249 Family history of ischemic heart disease and other diseases of the circulatory system: Secondary | ICD-10-CM | POA: Diagnosis not present

## 2021-08-06 DIAGNOSIS — Z806 Family history of leukemia: Secondary | ICD-10-CM | POA: Diagnosis not present

## 2021-08-06 DIAGNOSIS — C321 Malignant neoplasm of supraglottis: Secondary | ICD-10-CM | POA: Diagnosis not present

## 2021-08-06 DIAGNOSIS — Z79810 Long term (current) use of selective estrogen receptor modulators (SERMs): Secondary | ICD-10-CM | POA: Insufficient documentation

## 2021-08-06 DIAGNOSIS — Z9049 Acquired absence of other specified parts of digestive tract: Secondary | ICD-10-CM | POA: Diagnosis not present

## 2021-08-06 DIAGNOSIS — Z79811 Long term (current) use of aromatase inhibitors: Secondary | ICD-10-CM | POA: Insufficient documentation

## 2021-08-06 DIAGNOSIS — E041 Nontoxic single thyroid nodule: Secondary | ICD-10-CM | POA: Insufficient documentation

## 2021-08-06 DIAGNOSIS — Z818 Family history of other mental and behavioral disorders: Secondary | ICD-10-CM | POA: Insufficient documentation

## 2021-08-06 DIAGNOSIS — Z9221 Personal history of antineoplastic chemotherapy: Secondary | ICD-10-CM | POA: Diagnosis not present

## 2021-08-06 DIAGNOSIS — C778 Secondary and unspecified malignant neoplasm of lymph nodes of multiple regions: Secondary | ICD-10-CM | POA: Insufficient documentation

## 2021-08-06 DIAGNOSIS — R590 Localized enlarged lymph nodes: Secondary | ICD-10-CM

## 2021-08-06 DIAGNOSIS — R911 Solitary pulmonary nodule: Secondary | ICD-10-CM | POA: Diagnosis not present

## 2021-08-06 DIAGNOSIS — R490 Dysphonia: Secondary | ICD-10-CM | POA: Insufficient documentation

## 2021-08-06 DIAGNOSIS — Z79899 Other long term (current) drug therapy: Secondary | ICD-10-CM | POA: Diagnosis not present

## 2021-08-06 DIAGNOSIS — C50412 Malignant neoplasm of upper-outer quadrant of left female breast: Secondary | ICD-10-CM | POA: Insufficient documentation

## 2021-08-06 DIAGNOSIS — Z87891 Personal history of nicotine dependence: Secondary | ICD-10-CM | POA: Diagnosis not present

## 2021-08-06 DIAGNOSIS — Z510 Encounter for antineoplastic radiation therapy: Secondary | ICD-10-CM | POA: Diagnosis not present

## 2021-08-06 DIAGNOSIS — Z8041 Family history of malignant neoplasm of ovary: Secondary | ICD-10-CM | POA: Diagnosis not present

## 2021-08-06 DIAGNOSIS — Z923 Personal history of irradiation: Secondary | ICD-10-CM | POA: Diagnosis not present

## 2021-08-06 DIAGNOSIS — Z8719 Personal history of other diseases of the digestive system: Secondary | ICD-10-CM | POA: Diagnosis not present

## 2021-08-06 DIAGNOSIS — Z8051 Family history of malignant neoplasm of kidney: Secondary | ICD-10-CM | POA: Insufficient documentation

## 2021-08-06 DIAGNOSIS — Z836 Family history of other diseases of the respiratory system: Secondary | ICD-10-CM | POA: Insufficient documentation

## 2021-08-06 DIAGNOSIS — Z800 Family history of malignant neoplasm of digestive organs: Secondary | ICD-10-CM | POA: Insufficient documentation

## 2021-08-06 DIAGNOSIS — Z809 Family history of malignant neoplasm, unspecified: Secondary | ICD-10-CM | POA: Insufficient documentation

## 2021-08-06 NOTE — Assessment & Plan Note (Addendum)
She was found to have PET imaging concerning for supraclavicular, mediastinal and hilar lymphadenopathy and hypermetabolic left lower lobe pulmonary nodule.  She is a heavy smoker, smoked around 2 packs/day for the past 35 years. She also has past medical history significant for breast cancer, ER/PR positive and she was on tamoxifen for almost 5 to 6 years, discontinued August 2022 since she has completed her course.  This is not quite a typical pattern of spread for breast cancer especially ER/PR positive although this could certainly be possible.  I am more worried about a primary lung cancer given her history of smoking and a hypermetabolic left lower lobe lung nodule.  Either way we will have to proceed with biopsy and obtain pathology before we discuss recommendations.  If this appears to be a primary lung cancer, this could still be treated with a definitive intent since there is no obvious evidence of metastatic disease.  I have very clearly discussed this with the patient and her sister.  They have to return to clinic first week of February to review pathology results and to discuss any additional recommendations.  I have sent an in basket message to Dr. Isidore Moos to review the plan of care if this were to be a primary lung cancer. If this were metastatic breast cancer and with similar prognostics, we would likely consider systemic therapy which is possibly endocrine therapy depending on the prognostics after completion of radiation for her glottic cancer.

## 2021-08-06 NOTE — Assessment & Plan Note (Signed)
This is a very pleasant 71 year old female patient, heavy smoker 2 packs/day who is now newly diagnosed with right glottic squamous cell carcinoma, p16 positive. She had imaging to confirm staging and this has revealed borderline right level 2 lymph node, left supraclavicular, mediastinal and hilar adenopathy and hypermetabolic left lower lobe pulmonary nodule all suspicious of metastatic focus.  No abdominal pelvic metastatic disease or osseous metastatic disease.  She is here to review recommendations.  We have discussed her case in the head and neck multidisciplinary conference and it is unlikely that she has mediastinal disease and pulmonary nodule secondary to the supraglottic cancer given the early stage.  We have discussed that she will have to undergo a biopsy of the mediastinal/hilar lymphadenopathy to confirm the origin of this disease.  At this time she is planned to undergo radiation alone for her supraglottic or glottic cancer without definitive intent of treatment.  I do not believe she needs chemotherapy for her glottic cancer.

## 2021-08-06 NOTE — Progress Notes (Signed)
[facility] CONSULT NOTE  Patient Care Team: [name], MD as PCP - General (Internal Medicine) Herminio Commons, MD (Inactive) as PCP - Cardiology (Cardiology) Whitney Muse, Kelby Fam, MD (Inactive) as Consulting Physician (Hematology and Oncology) Erroll Luna, MD as Consulting Physician (General Surgery) Thea Silversmith, MD as Consulting Physician (Radiation Oncology) Sylvan Cheese, NP as Nurse Practitioner (Hematology and Oncology) Danie Binder, MD (Inactive) as Consulting Physician (Gastroenterology)  CHIEF COMPLAINTS/PURPOSE OF CONSULTATION:  Newly diagnosed glottic cancer  HISTORY OF PRESENTING ILLNESS:  Regina Mcmahon 71 y.o. female is here because of recent diagnosis of new diagnosis of malignant neoplasm of glottis/supraglottis.  She had prior medical history of left breast cancer.  I reviewed her records extensively and collaborated the history with the patient.  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    06/24/2014 Breast US   Left breast: ill-defined predominantly hypoechoic area with some increased echogenicity in the 2 o'clock position of the left breast, 7 cm from the nipple. 2.6 x 2.3 x 1.8 cm in maximum dimensions.   07/01/2014 Initial Biopsy   Left breast core needle bx: Invasive mammary carcinoma with lobular features, ER+ (100%), PR+ (91%), HER2/neu negative (ratio 1.09), Ki-67 32%. E-cadherin strongly positive, diagnostic for IDC    07/01/2014 Clinical Stage   Stage IIA: T2 N0   08/20/2014 Definitive Surgery   Left breast seed localized lumpectomy/SLNB: IDC, +LVI, DCIS, 1 LN removed and positive for metastatic carcinoma. Grade 2. HER2/neu  repeated and negative (ratio 0.69-1.9).    08/20/2014 Pathologic Stage   Stage IIB: pT2 pN1a pMx   09/17/2014 Surgery   Port-a cath placement and re-excision   10/02/2014 - 01/15/2015 Chemotherapy   CMF x 6 cycles.  patient refused Taxane-containing chemotherapy.   03/07/2015 Procedure   Comp Cancer Panel reveals no variant at APC, ATM, AXIN2, BARD1, BMPR1A, BRCA1, BRCA2, BRIP1, CDH1, CDK4, CDKN2A, CHEK2, FANCC, MLH1, MSH2, MSH6, MUTYH, NBN, PALB2, PMS2, POLD1, POLE, PTEN, RAD51C, RAD51D, SMAD4, STK11, TP53, VHL, and XRCC2.    04/06/2015 - 05/21/2015 Radiation Therapy   Adjuvant RT Pablo Ledger): Left breast/ 45 Gy over 25 fractions.   Left supraclavicular fossa and axilla/ 45 Gy over 25 fractions. Left breast boost/ 16 Gy over 8 fractions. Total dose: 61 Gy   06/04/2015 - 08/07/2015 Anti-estrogen oral therapy   Exemestane 25 mg daily.  Planned duration of therapy 5 years.    07/23/2015 Survivorship   Survivorship care plan completed and mailed to patient in lieu of in person visit   08/06/2015 Adverse Reaction   Severe hot flashes and mood swings   08/08/2015 - 12/07/2015 Anti-estrogen oral therapy   Arimidex   12/04/2015 Adverse Reaction   Worsening depression, patient stopped Arimidex   12/07/2015 -  Anti-estrogen oral therapy   Tamoxifen    She is currently on adjuvant tamoxifen.  She was recently seen for chief complaint of raspiness of her voice which then progressed to severe hoarseness.  She became so uncomfortable that she could not talk.  She denied any problem with swallowing or breathing.  She had thyroid ultrasound which revealed a suspicious nodule in the mid right thyroid lobe measuring 5 cm in greatest extent warranting  further evaluation.  Subsequently the patient saw Dr. Redmond Baseman who performed a laryngoscopy which revealed a granular mass of right glottis suggestive of glottic cancer.  She then had CT neck which revealed a 9 mm nodule about right vocal cord consistent with  history of glottic carcinoma, no enlarged lymph nodes.  There was however an enlarged right paratracheal lymph node which was thought to be suspicious for metastatic disease as well as the right thyroid nodule measuring 3.9 x 3.4 cm.  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  She then had imaging of the chest which revealed multiple enlarged mediastinal, bilateral hilar, left supraclavicular lymph nodes consistent with nodal metastatic disease, multiple small bilateral pulmonary nodules suspicious for pulmonary metastatic disease, additional nonspecific small nodules.  She then had PET/CT which demonstrated small right glottic lesion is mildly hypermetabolic and consistent with no neoplasm.  PET also demonstrated borderline right level 2 lymph node left supraclavicular, mediastinal and hilar lymphadenopathy and hypermetabolic left lower lobe pulmonary nodule all suspicious of metastatic focus.  No abdominal pelvic metastatic disease or osseous metastatic disease was appreciated.  Cytology from thyroid biopsy showed a benign follicular nodule Bethesda category 2 She is scheduled for biopsy of lung on 08/23/2021  She saw my colleague Dr. Isidore Moos and recommendation was to consider radiation for the glottic cancer and to also proceed with bronchoscopy to look at the etiology of the nodes in her chest.  Once we have this pathology available, I am happy to discuss therapy recommendations.  In the past she had breast cancer which was grade 2 ER 100%, PR 91% and HER2 negative KI of 32%  She is here for initial consultation.  She was previously seen by my colleague Dr. Delton Coombes however wanted to follow-up in [place] and hence the switch.  She is here for an initial visit with her sister. She is understandably very anxious about the possible diagnosis. She is a heavy smoker, smoked 2 PPD , started at the age of 49. She denies any change in breathing, cough, hemoptysis.  She said she had a hip replacement in Aug and post surgery, she started noticing some hoarseness but she wondered if this was from intubation, but when her hoarseness continued to worsen, she  seeked attention. She took Tamoxifen for almost 6 yrs, stopped it before hip replacement in Aug 2022. She denies any other complaints today for me.  Rest of the pertinent 10 point ROS reviewed and negative.  MEDICAL HISTORY:  Past Medical History:  Diagnosis Date   Asthma    Asymptomatic varicose veins    Breast cancer ([place]) 06/2014   left   Chronic airway obstruction (HCC)    Complication of anesthesia    difficulty waking up   COPD (chronic obstructive pulmonary disease) (HCC)    Depression    GERD (gastroesophageal reflux disease)    Hemorrhage rectum    and anus.   Hypertension    Insomnia    Other symptoms involving digestive system(787.99)    Pain    Hx.pain in joint involving pelvic region and thigh; pain in limb   Palpitations    Panic attacks    Personal history of chemotherapy 2016   Personal history of radiation therapy 2016   Pre-diabetes    Sinusitis    Symptomatic menopausal or female climacteric states     SURGICAL HISTORY: Past Surgical History:  Procedure Laterality Date   BREAST BIOPSY Left 10/2012   BREAST BIOPSY Left  07/01/2014   malignant   BREAST LUMPECTOMY Left 08/20/2014   CESAREAN SECTION     CHOLECYSTECTOMY  1985   COLONOSCOPY  2002   Dr. Lindalou Hose: internal hemorrhoids, one small rectal polyp, adenomatous path    COLONOSCOPY N/A 11/23/2015   Procedure: COLONOSCOPY;  Surgeon: Danie Binder, MD;  [facility];  Service: Endoscopy;  Laterality: N/A;  1100 - moved to 10:45 - office to notify   ESOPHAGOGASTRODUODENOSCOPY N/A 11/23/2015   Procedure: ESOPHAGOGASTRODUODENOSCOPY (EGD);  Surgeon: Danie Binder, MD;  [facility];  Service: Endoscopy;  Laterality: N/A;   PORT-A-CATH REMOVAL  02/2015   PORTACATH PLACEMENT Right 09/17/2014    Procedure: INSERTION PORT-A-CATH WITH ULTRASOUND;  Surgeon: Erroll Luna, MD;  Location: [facility];  Service: General;  Laterality: Right;  IJ   RADIOACTIVE SEED GUIDED PARTIAL MASTECTOMY WITH AXILLARY SENTINEL LYMPH NODE BIOPSY Left 08/20/2014   Procedure: LEFT BREAST LUMPECTOMY WITH RADIOACTIVE SEED LOCALIZATION AND SENTINEL LYMPH NODE MAPPING;  Surgeon: Erroll Luna, MD;  Location: [facility];  Service: General;  Laterality: Left;   RE-EXCISION OF BREAST LUMPECTOMY Left 09/17/2014   Procedure: RE-EXCISION OF BREAST LUMPECTOMY;  Surgeon: Erroll Luna, MD;  Location: [facility];  Service: General;  Laterality: Left;   TOTAL HIP ARTHROPLASTY Right 02/22/2021   Procedure: RIGHT TOTAL HIP ARTHROPLASTY ANTERIOR APPROACH;  Surgeon: Marybelle Killings, MD;  Location: [facility];  Service: Orthopedics;  Laterality: Right;   TUBAL LIGATION      SOCIAL HISTORY: Social History   Socioeconomic History   Marital status: Divorced    Spouse name: Not on file   Number of children: 2   Years of education: Not on file   Highest education level: Not on file  Occupational History   Not on file  Tobacco Use   Smoking status: Former    Packs/day: 1.00    Years: 42.00    Pack years: 42.00    Types: Cigarettes   Smokeless tobacco: Never  Vaping Use   Vaping Use: Never used  Substance and Sexual Activity   Alcohol use: No    Alcohol/week: 0.0 standard drinks   Drug use: No   Sexual activity: Never    Birth control/protection: None  Other Topics Concern   Not on file  Social History Narrative   Not on file   Social Determinants of Health   Financial Resource Strain: Not on file  Food Insecurity: Not on file  Transportation Needs: Not on file  Physical Activity: Not on file  Stress: Not on file  Social Connections: Unknown   Frequency of Communication with Friends and Family: More than three times a week   Frequency of Social Gatherings with Friends and  Family: More than three times a week   Attends Religious Services: Not on Electrical engineer or Organizations: Not on file   Attends Archivist Meetings: Not on file   Marital Status: Not on file  Intimate Partner Violence: Not on file    FAMILY HISTORY: Family History  Problem Relation Age of Onset   Diabetes Mother    Ovarian cancer Mother 66   Other Father        died in MVA at age 72   Other Sister        mitral valve disorder   Melanoma Sister 6       removed from back of leg   Colon cancer Brother  early 40s, succumbed to disease  ° Cancer Paternal Aunt   °     leukemia, bone, or lung cancer  ° Alzheimer's disease Maternal Grandmother   ° Heart attack Maternal Grandfather   ° Heart attack Paternal Grandmother   ° COPD Paternal Grandfather   ° Emphysema Paternal Grandfather   ° Congestive Heart Failure Paternal Aunt   ° Stomach cancer Paternal Uncle   ° Kidney cancer Maternal Uncle   ° Cancer Cousin   °     dx. teens/dx. 40s  ° Cancer Cousin   ° Colon cancer Cousin   ° ° °ALLERGIES:  has No Known Allergies. ° °MEDICATIONS:  °Current Outpatient Medications  °Medication Sig Dispense Refill  ° albuterol (PROVENTIL HFA;VENTOLIN HFA) 108 (90 BASE) MCG/ACT inhaler Inhale 2 puffs into the lungs every 6 (six) hours as needed for wheezing or shortness of breath.     ° ALPRAZolam (XANAX) 0.5 MG tablet Take 0.5 mg by mouth 3 (three) times daily as needed for anxiety.     ° ANORO ELLIPTA 62.5-25 MCG/INH AEPB Inhale 1 puff into the lungs daily as needed (SOB).    ° aspirin EC 325 MG EC tablet Take 1 tablet (325 mg total) by mouth daily with breakfast. Must take at least 4 weeks postop for DVT prophylaxis (Patient not taking: Reported on 04/15/2021) 30 tablet 0  ° calcium carbonate (TUMS - DOSED IN MG ELEMENTAL CALCIUM) 500 MG chewable tablet Chew 1 tablet by mouth 3 (three) times daily as needed for indigestion or heartburn. Reported on 12/07/2015    ° cephALEXin (KEFLEX) 500 MG  capsule Take 1 capsule (500 mg total) by mouth 4 (four) times daily. 60 capsule 0  ° furosemide (LASIX) 20 MG tablet Take 20 mg by mouth daily.    ° hydrochlorothiazide (HYDRODIURIL) 12.5 MG tablet Take 12.5 mg by mouth daily.    ° HYDROcodone-acetaminophen (NORCO/VICODIN) 5-325 MG tablet Take 1 tablet by mouth every 6 (six) hours as needed for severe pain. (Patient not taking: Reported on 07/30/2021) 50 tablet 0  ° ibuprofen (ADVIL) 200 MG tablet Take 800 mg by mouth every 8 (eight) hours as needed for moderate pain.    ° pantoprazole (PROTONIX) 40 MG tablet Take 1 tablet by mouth once daily 90 tablet 0  ° potassium citrate (UROCIT-K) 10 MEQ (1080 MG) SR tablet Take 10 mEq by mouth daily.    ° sertraline (ZOLOFT) 100 MG tablet Take 100 mg by mouth daily.    ° traMADol (ULTRAM) 50 MG tablet TAKE 1 TABLET BY MOUTH EVERY 6 HOURS AS NEEDED (Patient not taking: Reported on 07/30/2021) 30 tablet 0  ° °No current facility-administered medications for this visit.  ° ° °REVIEW OF SYSTEMS:   °Constitutional: Denies fevers, chills or abnormal night sweats °Eyes: Denies blurriness of vision, double vision or watery eyes °Ears, nose, mouth, throat, and face: Denies mucositis or sore throat °Respiratory: Denies cough, dyspnea or wheezes °Cardiovascular: Denies palpitation, chest discomfort or lower extremity swelling °Gastrointestinal:  Denies nausea, heartburn or change in bowel habits °Skin: Denies abnormal skin rashes °Lymphatics: Denies new lymphadenopathy or easy bruising °Neurological:Denies numbness, tingling or new weaknesses °Behavioral/Psych: Mood is stable, no new changes  °Breast: Denies any palpable lumps or discharge °All other systems were reviewed with the patient and are negative. ° °PHYSICAL EXAMINATION: °ECOG PERFORMANCE STATUS: 0 - Asymptomatic ° °Vitals:  ° 08/06/21 1032  °BP: (!) 156/72  °Pulse: (!) 59  °Resp: 17  °Temp: 97.9 °F (36.6 °C)  °  SpO2: 93%   Filed Weights   08/06/21 1032  Weight: 217 lb 11.2 oz  (98.7 kg)    GENERAL:alert, no distress and comfortable SKIN: skin color, texture, turgor are normal, no rashes or significant lesions EYES: normal, conjunctiva are pink and non-injected, sclera clear OROPHARYNX:no exudate, no erythema and lips, buccal mucosa, and tongue normal  NECK: supple, thyroid normal size, non-tender, without nodularity LYMPH:  no palpable lymphadenopathy in the cervical, axillary  LUNGS: clear to auscultation and percussion with normal breathing effort HEART: regular rate & rhythm and no murmurs and no lower extremity edema ABDOMEN:abdomen soft, non-tender and normal bowel sounds Musculoskeletal:no cyanosis of digits and no clubbing  PSYCH: alert & oriented x 3 with fluent speech NEURO: no focal motor/sensory deficits BREAST:No palpable nodules in breast. No palpable axillary or supraclavicular lymphadenopathy   LABORATORY DATA:  I have reviewed the data as listed Lab Results  Component Value Date   WBC 12.9 (H) 02/24/2021   HGB 12.4 02/24/2021   HCT 35.8 (L) 02/24/2021   MCV 92.7 02/24/2021   PLT 174 02/24/2021   Lab Results  Component Value Date   NA 136 02/26/2021   K 3.5 02/26/2021   CL 96 (L) 02/26/2021   CO2 32 02/26/2021    RADIOGRAPHIC STUDIES: I have personally reviewed the radiological reports and agreed with the findings in the report.  ASSESSMENT AND PLAN:  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[place]) This is a very pleasant 71 year old female patient, heavy smoker 2 packs/day who is now newly diagnosed with right glottic squamous cell carcinoma, p16 positive. She had imaging to confirm staging and this has revealed borderline right level 2 lymph node, left supraclavicular, mediastinal and hilar adenopathy and hypermetabolic left lower lobe pulmonary nodule all suspicious of metastatic focus.  No abdominal pelvic metastatic disease or osseous metastatic disease.  She is here to review recommendations.  We have discussed her case in the head  and neck multidisciplinary conference and it is unlikely that she has mediastinal disease and pulmonary nodule secondary to the supraglottic cancer given the early stage.  We have discussed that she will have to undergo a biopsy of the mediastinal/hilar lymphadenopathy to confirm the origin of this disease.  At this time she is planned to undergo radiation alone for her supraglottic or glottic cancer without definitive intent of treatment.  I do not believe she needs chemotherapy for her glottic cancer.  Mediastinal adenopathy She was found to have PET imaging concerning for supraclavicular, mediastinal and hilar lymphadenopathy and hypermetabolic left lower lobe pulmonary nodule.  She is a heavy smoker, smoked around 2 packs/day for the past 35 years. She also has past medical history significant for breast cancer, ER/PR positive and she was on tamoxifen for almost 5 to 6 years, discontinued August 2022 since she has completed her course.  This is not quite a typical pattern of spread for breast cancer especially ER/PR positive although this could certainly be possible.  I am more worried about a primary lung cancer given her history of smoking and a hypermetabolic left lower lobe lung nodule.  Either way we will have to proceed with biopsy and obtain pathology before we discuss recommendations.  If this appears to be a primary lung cancer, this could still be treated with a definitive intent since there is no obvious evidence of metastatic disease.  I have very clearly discussed this with the patient and her sister.  They have to return to clinic first week of February to review  pathology results and to discuss any additional recommendations.  I have sent an in basket message to Dr. Isidore Moos to review the plan of care if this were to be a primary lung cancer. If this were metastatic breast cancer and with similar prognostics, we would likely consider systemic therapy which is possibly endocrine therapy  depending on the prognostics after completion of radiation for her glottic cancer.  Thyroid nodule Biopsy of the thyroid nodule which did not show any evidence of malignancy.   All questions were answered. The patient knows to call the clinic with any problems, questions or concerns.    Benay Pike, MD 08/06/21

## 2021-08-06 NOTE — Assessment & Plan Note (Signed)
Biopsy of the thyroid nodule which did not show any evidence of malignancy.

## 2021-08-07 DIAGNOSIS — F419 Anxiety disorder, unspecified: Secondary | ICD-10-CM | POA: Diagnosis not present

## 2021-08-07 DIAGNOSIS — C320 Malignant neoplasm of glottis: Secondary | ICD-10-CM | POA: Diagnosis not present

## 2021-08-07 DIAGNOSIS — R490 Dysphonia: Secondary | ICD-10-CM | POA: Diagnosis not present

## 2021-08-07 DIAGNOSIS — R69 Illness, unspecified: Secondary | ICD-10-CM | POA: Diagnosis not present

## 2021-08-10 ENCOUNTER — Inpatient Hospital Stay: Payer: Medicare HMO | Admitting: Nutrition

## 2021-08-10 ENCOUNTER — Telehealth: Payer: Self-pay | Admitting: Licensed Clinical Social Worker

## 2021-08-10 ENCOUNTER — Other Ambulatory Visit: Payer: Self-pay

## 2021-08-10 ENCOUNTER — Inpatient Hospital Stay: Payer: Medicare HMO

## 2021-08-10 DIAGNOSIS — Z833 Family history of diabetes mellitus: Secondary | ICD-10-CM | POA: Diagnosis not present

## 2021-08-10 DIAGNOSIS — Z806 Family history of leukemia: Secondary | ICD-10-CM | POA: Diagnosis not present

## 2021-08-10 DIAGNOSIS — Z8249 Family history of ischemic heart disease and other diseases of the circulatory system: Secondary | ICD-10-CM | POA: Diagnosis not present

## 2021-08-10 DIAGNOSIS — C320 Malignant neoplasm of glottis: Secondary | ICD-10-CM | POA: Diagnosis not present

## 2021-08-10 DIAGNOSIS — Z8719 Personal history of other diseases of the digestive system: Secondary | ICD-10-CM | POA: Diagnosis not present

## 2021-08-10 DIAGNOSIS — Z79810 Long term (current) use of selective estrogen receptor modulators (SERMs): Secondary | ICD-10-CM | POA: Diagnosis not present

## 2021-08-10 DIAGNOSIS — Z808 Family history of malignant neoplasm of other organs or systems: Secondary | ICD-10-CM | POA: Diagnosis not present

## 2021-08-10 DIAGNOSIS — C321 Malignant neoplasm of supraglottis: Secondary | ICD-10-CM

## 2021-08-10 DIAGNOSIS — Z87891 Personal history of nicotine dependence: Secondary | ICD-10-CM | POA: Diagnosis not present

## 2021-08-10 DIAGNOSIS — Z923 Personal history of irradiation: Secondary | ICD-10-CM | POA: Diagnosis not present

## 2021-08-10 DIAGNOSIS — Z9221 Personal history of antineoplastic chemotherapy: Secondary | ICD-10-CM | POA: Diagnosis not present

## 2021-08-10 DIAGNOSIS — E041 Nontoxic single thyroid nodule: Secondary | ICD-10-CM | POA: Diagnosis not present

## 2021-08-10 DIAGNOSIS — Z79899 Other long term (current) drug therapy: Secondary | ICD-10-CM | POA: Diagnosis not present

## 2021-08-10 DIAGNOSIS — Z9049 Acquired absence of other specified parts of digestive tract: Secondary | ICD-10-CM | POA: Diagnosis not present

## 2021-08-10 DIAGNOSIS — Z800 Family history of malignant neoplasm of digestive organs: Secondary | ICD-10-CM | POA: Diagnosis not present

## 2021-08-10 DIAGNOSIS — R490 Dysphonia: Secondary | ICD-10-CM | POA: Diagnosis not present

## 2021-08-10 DIAGNOSIS — R5383 Other fatigue: Secondary | ICD-10-CM

## 2021-08-10 DIAGNOSIS — R5381 Other malaise: Secondary | ICD-10-CM

## 2021-08-10 DIAGNOSIS — Z170 Estrogen receptor positive status [ER+]: Secondary | ICD-10-CM

## 2021-08-10 DIAGNOSIS — C50412 Malignant neoplasm of upper-outer quadrant of left female breast: Secondary | ICD-10-CM | POA: Diagnosis not present

## 2021-08-10 DIAGNOSIS — C778 Secondary and unspecified malignant neoplasm of lymph nodes of multiple regions: Secondary | ICD-10-CM | POA: Diagnosis not present

## 2021-08-10 DIAGNOSIS — Z818 Family history of other mental and behavioral disorders: Secondary | ICD-10-CM | POA: Diagnosis not present

## 2021-08-10 DIAGNOSIS — Z8041 Family history of malignant neoplasm of ovary: Secondary | ICD-10-CM | POA: Diagnosis not present

## 2021-08-10 DIAGNOSIS — R911 Solitary pulmonary nodule: Secondary | ICD-10-CM | POA: Diagnosis not present

## 2021-08-10 DIAGNOSIS — J449 Chronic obstructive pulmonary disease, unspecified: Secondary | ICD-10-CM | POA: Diagnosis not present

## 2021-08-10 DIAGNOSIS — Z79811 Long term (current) use of aromatase inhibitors: Secondary | ICD-10-CM | POA: Diagnosis not present

## 2021-08-10 LAB — CBC WITH DIFFERENTIAL/PLATELET
Abs Immature Granulocytes: 0.01 10*3/uL (ref 0.00–0.07)
Basophils Absolute: 0.1 10*3/uL (ref 0.0–0.1)
Basophils Relative: 1 %
Eosinophils Absolute: 0.4 10*3/uL (ref 0.0–0.5)
Eosinophils Relative: 6 %
HCT: 42.4 % (ref 36.0–46.0)
Hemoglobin: 14 g/dL (ref 12.0–15.0)
Immature Granulocytes: 0 %
Lymphocytes Relative: 22 %
Lymphs Abs: 1.4 10*3/uL (ref 0.7–4.0)
MCH: 29.4 pg (ref 26.0–34.0)
MCHC: 33 g/dL (ref 30.0–36.0)
MCV: 88.9 fL (ref 80.0–100.0)
Monocytes Absolute: 0.3 10*3/uL (ref 0.1–1.0)
Monocytes Relative: 5 %
Neutro Abs: 4.4 10*3/uL (ref 1.7–7.7)
Neutrophils Relative %: 66 %
Platelets: 241 10*3/uL (ref 150–400)
RBC: 4.77 MIL/uL (ref 3.87–5.11)
RDW: 12.9 % (ref 11.5–15.5)
WBC: 6.5 10*3/uL (ref 4.0–10.5)
nRBC: 0 % (ref 0.0–0.2)

## 2021-08-10 LAB — COMPREHENSIVE METABOLIC PANEL
ALT: 8 U/L (ref 0–44)
AST: 10 U/L — ABNORMAL LOW (ref 15–41)
Albumin: 4.2 g/dL (ref 3.5–5.0)
Alkaline Phosphatase: 81 U/L (ref 38–126)
Anion gap: 6 (ref 5–15)
BUN: 18 mg/dL (ref 8–23)
CO2: 31 mmol/L (ref 22–32)
Calcium: 10.1 mg/dL (ref 8.9–10.3)
Chloride: 102 mmol/L (ref 98–111)
Creatinine, Ser: 1.01 mg/dL — ABNORMAL HIGH (ref 0.44–1.00)
GFR, Estimated: >60 mL/min (ref >60)
Glucose, Bld: 103 mg/dL — ABNORMAL HIGH (ref 70–99)
Potassium: 4.1 mmol/L (ref 3.5–5.1)
Sodium: 139 mmol/L (ref 135–145)
Total Bilirubin: 0.9 mg/dL (ref 0.3–1.2)
Total Protein: 7.5 g/dL (ref 6.5–8.1)

## 2021-08-10 LAB — CEA (IN HOUSE-CHCC): CEA (CHCC-In House): 5.97 ng/mL — ABNORMAL HIGH (ref 0.00–5.00)

## 2021-08-10 LAB — TSH: TSH: 1.004 u[IU]/mL (ref 0.308–3.960)

## 2021-08-10 NOTE — Telephone Encounter (Signed)
[place] Work  Clinical Social Work was referred by RD for coping support.  Clinical Social Worker contacted patient by phone  to offer support and assess for needs.  Discussed avenues for support and processing diagnosis and treatment such as sessions with this CSW, with our chaplain, and/or connection with a peer mentor. Patient grateful for the call. She is already talking with her pastor. She is interested in meeting with this CSW but would like to wait to schedule until after she has started treatment.   CSW contact information provided as pt would like to call when she is ready.    [place], [place] Worker [employer]

## 2021-08-10 NOTE — Progress Notes (Signed)
71 year old female diagnosed with glottis cancer and followed by Dr. Chryl Heck and Dr. Isidore Moos.  Patient will receive radiation therapy and final treatment is scheduled on February 17.  Past medical history includes breast cancer in 2015, COPD, depression, GERD, hypertension, and prediabetes. Patient is currently smoking.  Medications include Xanax, Lasix, and Protonix.  Labs are pending.  Height: 5 feet 4 inches. Weight: 219 pounds on January 17. Usual body weight: 230 pounds February 2022. BMI: 37.59. ECOG: 0.  Patient lost 30 pounds after hip surgery due to taste alterations and vomiting. Currently patient denies nutrition impact symptoms. She tolerates milk well.   She reports difficulty processing this diagnoses and would like to talk to someone.  Nutrition diagnosis: Unintended weight loss related to recent surgery as evidenced by 5% weight loss.  Intervention: Educated patient on the importance of weight maintenance during radiation treatments. Educated to consume smaller amounts of food more often incorporating adequate calories and port protein.   Suggested patient add 2% milk twice daily.  Provided 1 sample of Carnation breakfast essentials for patient to try.  Suggested she could add this in if intake declines.   Provided nutrition facts sheets on calories and protein sources. Questions answered.  Teach back method used.  Contact information given.   Will refer patient to spiritual care or social worker. I also provided contact information for patient to contact them as well.  Monitoring, evaluation, goals: Patient will tolerate adequate calories and protein for weight maintenance throughout treatment.  Next visit: Thursday, February 2 after radiation therapy.  **Disclaimer: This note was dictated with voice recognition software. Similar sounding words can inadvertently be transcribed and this note may contain transcription errors which may not have been corrected upon  publication of note.**

## 2021-08-11 LAB — CANCER ANTIGEN 27.29: CA 27.29: 8.5 U/mL (ref 0.0–38.6)

## 2021-08-11 LAB — CA 125: Cancer Antigen (CA) 125: 18.9 U/mL (ref 0.0–38.1)

## 2021-08-13 DIAGNOSIS — C321 Malignant neoplasm of supraglottis: Secondary | ICD-10-CM | POA: Diagnosis not present

## 2021-08-13 DIAGNOSIS — C320 Malignant neoplasm of glottis: Secondary | ICD-10-CM | POA: Diagnosis not present

## 2021-08-13 DIAGNOSIS — Z510 Encounter for antineoplastic radiation therapy: Secondary | ICD-10-CM | POA: Diagnosis not present

## 2021-08-16 ENCOUNTER — Ambulatory Visit
Admission: RE | Admit: 2021-08-16 | Discharge: 2021-08-16 | Disposition: A | Discharge status: Home / Self Care | Payer: Medicare HMO | Source: Ambulatory Visit | Attending: Radiation Oncology | Admitting: Radiation Oncology

## 2021-08-16 ENCOUNTER — Other Ambulatory Visit: Payer: Self-pay

## 2021-08-16 DIAGNOSIS — C321 Malignant neoplasm of supraglottis: Secondary | ICD-10-CM | POA: Diagnosis not present

## 2021-08-16 DIAGNOSIS — Z510 Encounter for antineoplastic radiation therapy: Secondary | ICD-10-CM | POA: Diagnosis not present

## 2021-08-16 DIAGNOSIS — C320 Malignant neoplasm of glottis: Secondary | ICD-10-CM | POA: Diagnosis not present

## 2021-08-16 MED ORDER — SONAFINE EX EMUL
1.0000 "application " | Freq: Two times a day (BID) | CUTANEOUS | Status: DC
  Administered 2021-08-16: 1 via TOPICAL

## 2021-08-16 NOTE — Progress Notes (Signed)
Oncology Nurse Navigator Documentation  ° °To provide support, encouragement and care continuity, met with Regina Mcmahon for her initial RT.  She was accompanied by her sister. °I reviewed the 2-step treatment process, answered questions.  °Regina Mcmahon completed treatment without difficulty, denied questions/concerns. °I reviewed the registration/arrival procedure for subsequent treatments. °I encouraged them to call me with questions/concerns as tmts proceed. ° ° °  RN, BSN, OCN °Head & Neck Oncology Nurse Navigator °[facility] °Phone # [phone]  °Fax # [phone]   °

## 2021-08-16 NOTE — Progress Notes (Signed)
Pt here for patient teaching.    Pt given Managing Acute Radiation Side Effects for Head and Neck Cancer handout, skin care instructions, and Sonafine.    Reviewed areas of pertinence such as fatigue, mouth changes, skin changes, throat changes, earaches, and taste changes .   Pt able to give teach back of to pat skin, use unscented/gentle soap, and drink plenty of water,apply Sonafine bid and avoid applying anything to skin within 4 hours of treatment.   Pt demonstrated understanding and verbalizes understanding of information given and will contact nursing with any questions or concerns.    [email or web address]

## 2021-08-17 ENCOUNTER — Ambulatory Visit
Admission: RE | Admit: 2021-08-17 | Discharge: 2021-08-17 | Disposition: A | Discharge status: Home / Self Care | Payer: Medicare HMO | Source: Ambulatory Visit | Attending: Radiation Oncology | Admitting: Radiation Oncology

## 2021-08-17 ENCOUNTER — Other Ambulatory Visit: Payer: Self-pay

## 2021-08-17 DIAGNOSIS — C320 Malignant neoplasm of glottis: Secondary | ICD-10-CM | POA: Diagnosis not present

## 2021-08-17 DIAGNOSIS — Z510 Encounter for antineoplastic radiation therapy: Secondary | ICD-10-CM | POA: Diagnosis not present

## 2021-08-17 DIAGNOSIS — C321 Malignant neoplasm of supraglottis: Secondary | ICD-10-CM | POA: Diagnosis not present

## 2021-08-18 ENCOUNTER — Ambulatory Visit
Admission: RE | Admit: 2021-08-18 | Discharge: 2021-08-18 | Disposition: A | Discharge status: Home / Self Care | Payer: Medicare HMO | Source: Ambulatory Visit | Attending: Radiation Oncology | Admitting: Radiation Oncology

## 2021-08-18 ENCOUNTER — Telehealth: Payer: Self-pay

## 2021-08-18 DIAGNOSIS — C320 Malignant neoplasm of glottis: Secondary | ICD-10-CM | POA: Diagnosis not present

## 2021-08-18 DIAGNOSIS — C321 Malignant neoplasm of supraglottis: Secondary | ICD-10-CM | POA: Diagnosis not present

## 2021-08-18 DIAGNOSIS — Z510 Encounter for antineoplastic radiation therapy: Secondary | ICD-10-CM | POA: Diagnosis not present

## 2021-08-18 NOTE — Telephone Encounter (Signed)
VM forwarded to this nurse from patient's sister Neoma Laming. Neoma Laming stated she tested positive for COVID today 08/18/21, which meant the patient had been exposed. She wanted to know what that would mean for patient's radiation appointments.   Returned Deborah's call but did not receive an answer. Left detailed message explaining patient would still be able to receive her radiation, but her appointments would need to be moved to the end of the day and she would have to enter the building from the back entrance. Also asked that patient take a home COVID test, and if negative plan to repeat test in 3 days. Relayed that radiation therapists had left for the day, but someone would be calling Neoma Laming tomorrow morning with new appointment times and directions to on entering the building from the back.   Secure e-mail sent to Dr. Isidore Moos and lead therapists with update

## 2021-08-19 ENCOUNTER — Ambulatory Visit
Admission: RE | Admit: 2021-08-19 | Discharge: 2021-08-19 | Disposition: A | Discharge status: Home / Self Care | Payer: Medicare HMO | Source: Ambulatory Visit | Attending: Radiation Oncology | Admitting: Radiation Oncology

## 2021-08-19 DIAGNOSIS — C320 Malignant neoplasm of glottis: Secondary | ICD-10-CM | POA: Diagnosis not present

## 2021-08-19 DIAGNOSIS — Z510 Encounter for antineoplastic radiation therapy: Secondary | ICD-10-CM | POA: Diagnosis not present

## 2021-08-19 DIAGNOSIS — C321 Malignant neoplasm of supraglottis: Secondary | ICD-10-CM | POA: Diagnosis not present

## 2021-08-20 ENCOUNTER — Other Ambulatory Visit: Payer: Self-pay

## 2021-08-20 ENCOUNTER — Ambulatory Visit
Admission: RE | Admit: 2021-08-20 | Discharge: 2021-08-20 | Disposition: A | Discharge status: Home / Self Care | Payer: Medicare HMO | Source: Ambulatory Visit | Attending: Radiation Oncology | Admitting: Radiation Oncology

## 2021-08-20 ENCOUNTER — Encounter (HOSPITAL_COMMUNITY): Payer: Self-pay | Admitting: Emergency Medicine

## 2021-08-20 DIAGNOSIS — C320 Malignant neoplasm of glottis: Secondary | ICD-10-CM | POA: Diagnosis not present

## 2021-08-20 DIAGNOSIS — Z510 Encounter for antineoplastic radiation therapy: Secondary | ICD-10-CM | POA: Diagnosis not present

## 2021-08-20 DIAGNOSIS — C321 Malignant neoplasm of supraglottis: Secondary | ICD-10-CM | POA: Diagnosis not present

## 2021-08-20 NOTE — Anesthesia Preprocedure Evaluation (Addendum)
Anesthesia Evaluation  Patient identified by MRN, date of birth, ID band Patient awake    Reviewed: Allergy & Precautions, NPO status , Patient's Chart, lab work & pertinent test results  History of Anesthesia Complications Negative for: history of anesthetic complications  Airway Mallampati: III  TM Distance: >3 FB Neck ROM: Full    Dental  (+) Dental Advisory Given   Pulmonary shortness of breath, asthma , neg sleep apnea, COPD, neg recent URI, former smoker,    breath sounds clear to auscultation       Cardiovascular hypertension, Pt. on medications (-) angina(-) Past MI and (-) CHF  Rhythm:Regular  1. Left ventricular ejection fraction, by estimation, is 65 to 70%. The  left ventricle has normal function. The left ventricle has no regional  wall motion abnormalities. Left ventricular diastolic parameters were  normal.  2. Right ventricular systolic function is normal. The right ventricular  size is normal.  3. The mitral valve is grossly normal. No evidence of mitral valve  regurgitation.  4. The aortic valve is tricuspid. Aortic valve regurgitation is not  visualized. No aortic stenosis is present.  5. The inferior vena cava is normal in size with greater than 50%  respiratory variability, suggesting right atrial pressure of 3 mmHg.    Neuro/Psych PSYCHIATRIC DISORDERS Anxiety Depression negative neurological ROS     GI/Hepatic Neg liver ROS, GERD  ,  Endo/Other  negative endocrine ROS  Renal/GU negative Renal ROS     Musculoskeletal  (+) Arthritis ,   Abdominal   Peds  Hematology negative hematology ROS (+) Lab Results      Component                Value               Date                      WBC                      6.5                 08/10/2021                HGB                      14.0                08/10/2021                HCT                      42.4                08/10/2021                 MCV                      88.9                08/10/2021                PLT                      241                 08/10/2021  Anesthesia Other Findings   Reproductive/Obstetrics                           Anesthesia Physical Anesthesia Plan  ASA: 3  Anesthesia Plan: General   Post-op Pain Management: Minimal or no pain anticipated   Induction: Intravenous  PONV Risk Score and Plan: 3 and Ondansetron, Dexamethasone, Propofol infusion and TIVA  Airway Management Planned: Oral ETT and Video Laryngoscope Planned  Additional Equipment: None  Intra-op Plan:   Post-operative Plan: Extubation in OR  Informed Consent: I have reviewed the patients History and Physical, chart, labs and discussed the procedure including the risks, benefits and alternatives for the proposed anesthesia with the patient or authorized representative who has indicated his/her understanding and acceptance.       Plan Discussed with: CRNA and Anesthesiologist  Anesthesia Plan Comments: (PAT note written 08/20/2021 by Myra Gianotti, PA-C. )       Anesthesia Quick Evaluation

## 2021-08-20 NOTE — Progress Notes (Signed)
Anesthesia Chart Review: Regina Mcmahon  Case: [id number] Date/Time: 08/23/21 0730   Procedures:      ROBOTIC ASSISTED NAVIGATIONAL BRONCHOSCOPY     VIDEO BRONCHOSCOPY WITH ENDOBRONCHIAL ULTRASOUND   Anesthesia type: General   Pre-op diagnosis: MEDIASTINAL ADENOPATHY, LEFT PULMONDARY NODULE   Location: [facility]   Surgeons: Collene Gobble, MD       DISCUSSION: Patient is a 71 year old female scheduled for the above procedure.  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  She also has history of left breast cancer treated in 2016.  07/23/21 CT Chest showed possible bilateral mediastinal and hilar lymphadenopathy, left supraclavicular nodes, and multiple small bilateral pulmonary nodules concerning for metastatic disease.  She also had an enlarged right thyroid nodule, with 08/04/21 FNA consistent with benign follicular nodule.  She has been evaluated by HEM-ONC, RAD-ONC, and pulmonology. The above procedure recommended for tissue diagnosis to direct management.    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  She reported "difficulty waking up" after anesthesia and had to be discharged on home O2 2L following 02/2021 THA.  BMI is consistent with obesity.   Patient reported BJYNW-29 exposure on 08/16/21. Her sister had taken her to radiation therapy and on 08/17/21 developed symptoms and had a positive home test. Patient denied COVID-19 type symptoms as of 08/20/21, although PCR test is still pending.  PAT RN sent message to Dr. Lamonte Sakai relaying this. Also last labs are from 08/10/21. Potassium was 4.1. She ran out of KCl 20 mEq that same week.  She is on HCTZ 12.5 mg and Lasix 20 mg daily. She is contacting her pharmacy for KCl 20 mEq refills, but in the meantime, she has KCl 10 mEq tablets and will takes 2 daily until she has her new refill. Defer additional lab orders, if any, to surgeon and/or anesthesiologist.   VS:  BP Readings from Last 3 Encounters:  08/06/21 (!) 156/72  07/30/21 (!) 149/72  07/29/21 118/78   Pulse Readings from Last 3 Encounters:  08/06/21 (!) 59  07/30/21 (!) 58  07/29/21 66     PROVIDERS: - [name], MD is PCP  - Christinia Gully, MD is primary pulmonologist - Benay Pike, MD is HEM-ONC in [place]. Last visit 08/06/21. Previously saw Derek Jack, MD, last 02/05/20 in [place]. Eppie Gibson, MD is RAD-ONC - Melida Quitter, MD is ENT - She is not followed routinely by cardiology, but was evaluated by Kate Sable, MD in 2021 (no longer with practice) for BLE edema. Echo was "normal."  He prescribed her Lasix.  He referred patient to vascular surgery for varicose veins.  Seen by Leontine Locket, PA-C on 01/21/2020.  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   LABS: Most recent lab results include: Lab Results  Component Value Date   WBC 6.5 08/10/2021   HGB 14.0 08/10/2021   HCT 42.4 08/10/2021   PLT 241 08/10/2021   GLUCOSE 103 (H) 08/10/2021   ALT 8 08/10/2021   AST 10 (L) 08/10/2021   NA 139 08/10/2021  K 4.1 08/10/2021   CL 102 08/10/2021   CREATININE 1.01 (H) 08/10/2021   BUN 18 08/10/2021   CO2 31 08/10/2021   TSH 1.004 08/10/2021   HGBA1C 5.6 02/18/2021    OTHER: SATURATION QUALIFICATIONS 02/27/21:  (This note is used to comply with regulatory documentation for home oxygen) - Patient Saturations on Room Air at Rest = 87% - Patient Saturations on Room Air while Ambulating = 85% - Patient Saturations on 2  Liters of oxygen while Ambulating = 92% - Please briefly explain why patient needs home oxygen: oxygen saturation drops on room air  PFT's  11/24/20  FEV1 1.59 (67 % ) ratio 0.61  p 0 % improvement from saba p saba prior to study with DLCO  8.42 (42%) corrects to 2.38 (56%)  for alv volume and FV curve mild concavity and ERV 36% @ wt 226      IMAGES: PET Scan 07/23/21: IMPRESSION: 1. Small right glottic lesion is mildly hypermetabolic and consistent with known neoplasm. 2. Borderline right level 2 lymph node. 3. Left supraclavicular, mediastinal and hilar lymphadenopathy. 4. Hypermetabolic left lower lobe pulmonary nodule likely metastatic focus. 5. No findings for abdominal/pelvic metastatic disease or osseous metastatic disease.   CT Chest 07/23/21: IMPRESSION: 1. Multiple enlarged mediastinal, bilateral hilar, and left supraclavicular lymph nodes, consistent with nodal metastatic disease. 2. Multiple small bilateral pulmonary nodules, largest in the anterior left lower lobe measuring 1.1 x 0.7 cm, suspicious for pulmonary metastatic disease. 3. Additional smaller nodules scattered throughout measure no greater than 0.4 cm. These are nonspecific and some or all may be infectious or inflammatory although pulmonary metastatic disease is not excluded. 4. Enlarged, multinodular right lobe of the thyroid. Recommend thyroid ultrasound if and when clinically appropriate given presumed metastatic malignancy (ref: J Am Coll Radiol. 2015 Feb;12(2): 143-50). 5. Emphysema. 6. Coronary artery disease. - Aortic Atherosclerosis (ICD10-I70.0) and Emphysema (ICD10-J43.9).  CT Soft tissue neck 06/23/21: IMPRESSION: 1. 9 mm nodule right vocal cord consistent with history of glottic carcinoma. No enlarged lymph nodes in the neck 2. Right paratracheal lymph node enlargement, suspicious for metastatic disease. Recommend CT chest with contrast to evaluate for additional adenopathy and possible  primary lung carcinoma. 3. Right thyroid nodule 39 x 34 mm. Recommend biopsy to rule out neoplasm. - S/p right thyroid nodule FNA 08/04/21: cytology consistent with benign follicular nodule (Bethesda category II)   EKG: 02/18/21: Sinus bradycardia at 53 bpm Low voltage QRS Cannot rule out Anterior infarct , age undetermined Abnormal ECG Confirmed by Cherlynn Kaiser [phone]) on 02/18/2021 9:40:06 PM - Poor precordial R wave progression was also noted on 05/07/2018 EKG.     CV: Echo 12/04/19: IMPRESSIONS   1. Left ventricular ejection fraction, by estimation, is 65 to 70%. The  left ventricle has normal function. The left ventricle has no regional  wall motion abnormalities. Left ventricular diastolic parameters were  normal.   2. Right ventricular systolic function is normal. The right ventricular  size is normal.   3. The mitral valve is grossly normal. No evidence of mitral valve  regurgitation.   4. The aortic valve is tricuspid. Aortic valve regurgitation is not  visualized. No aortic stenosis is present.   5. The inferior vena cava is normal in size with greater than 50%  respiratory variability, suggesting right atrial pressure of 3 mmHg.     Past Medical History:  Diagnosis Date   Arthritis    Asthma    Asymptomatic varicose veins    Breast  cancer ([facility]) 06/2014   left   Cancer ([facility])    Supra Glottis   Chronic airway obstruction ([facility])    Complication of anesthesia 2016   difficulty waking up, Oxygen dropped low.   COPD (chronic obstructive pulmonary disease) ([facility])    Depression    GERD (gastroesophageal reflux disease)    Hemorrhage rectum    and anus.   Hypertension    Insomnia    Other symptoms involving digestive system(787.99)    Pain    Hx.pain in joint involving pelvic region and thigh; pain in limb   Palpitations    Panic attacks    Personal history of chemotherapy 2016   Personal history of radiation therapy 2016   Pneumonia    Pre-diabetes     Sinusitis    Symptomatic menopausal or female climacteric states    Varicose veins of both lower extremities with pain     Past Surgical History:  Procedure Laterality Date   BREAST BIOPSY Left 10/2012   BREAST BIOPSY Left 07/01/2014   malignant   BREAST LUMPECTOMY Left 08/20/2014   CESAREAN SECTION     CHOLECYSTECTOMY  1985   COLONOSCOPY  2002   Dr. Lindalou Hose: internal hemorrhoids, one small rectal polyp, adenomatous path    COLONOSCOPY N/A 11/23/2015   Procedure: COLONOSCOPY;  Surgeon: Danie Binder, MD;  [facility];  Service: Endoscopy;  Laterality: N/A;  1100 - moved to 10:45 - office to notify   ESOPHAGOGASTRODUODENOSCOPY N/A 11/23/2015   Procedure: ESOPHAGOGASTRODUODENOSCOPY (EGD);  Surgeon: Danie Binder, MD;  [facility];  Service: Endoscopy;  Laterality: N/A;   PORT-A-CATH REMOVAL  02/2015   PORTACATH PLACEMENT Right 09/17/2014   Procedure: INSERTION PORT-A-CATH WITH ULTRASOUND;  Surgeon: Erroll Luna, MD;  Location: [facility];  Service: General;  Laterality: Right;  IJ   RADIOACTIVE SEED GUIDED PARTIAL MASTECTOMY WITH AXILLARY SENTINEL LYMPH NODE BIOPSY Left 08/20/2014   Procedure: LEFT BREAST LUMPECTOMY WITH RADIOACTIVE SEED LOCALIZATION AND SENTINEL LYMPH NODE MAPPING;  Surgeon: Erroll Luna, MD;  Location: [facility];  Service: General;  Laterality: Left;   RE-EXCISION OF BREAST LUMPECTOMY Left 09/17/2014   Procedure: RE-EXCISION OF BREAST LUMPECTOMY;  Surgeon: Erroll Luna, MD;  Location: [facility];  Service: General;  Laterality: Left;   TOTAL HIP ARTHROPLASTY Right 02/22/2021   Procedure: RIGHT TOTAL HIP ARTHROPLASTY ANTERIOR APPROACH;  Surgeon: Marybelle Killings, MD;  Location: [facility];  Service: Orthopedics;  Laterality: Right;   TUBAL LIGATION      MEDICATIONS: No current facility-administered medications for this encounter.    albuterol (PROVENTIL HFA;VENTOLIN HFA) 108 (90 BASE) MCG/ACT inhaler    ALPRAZolam (XANAX) 0.5 MG tablet   ANORO ELLIPTA 62.5-25 MCG/INH AEPB   calcium carbonate (TUMS - DOSED IN MG ELEMENTAL CALCIUM) 500 MG chewable tablet   diphenhydramine-acetaminophen (TYLENOL PM) 25-500 MG TABS tablet   furosemide (LASIX) 20 MG tablet   hydrochlorothiazide (HYDRODIURIL) 12.5 MG tablet   ibuprofen (ADVIL) 800 MG tablet   Menthol, Topical Analgesic, (BIOFREEZE EX)   pantoprazole (PROTONIX) 40 MG tablet   potassium chloride (KLOR-CON M) 10 MEQ tablet   sertraline (ZOLOFT) 100 MG tablet   aspirin EC 325 MG EC tablet  Not currently taking ASA.  Myra Gianotti, PA-C Surgical Short Stay/Anesthesiology [facility] Phone [phone] [facility] Phone [phone] 08/20/2021 5:47 PM

## 2021-08-20 NOTE — Progress Notes (Signed)
Regina Mcmahon denies chest pain or shortness of breath. Patient denies having any s/s of Covid in her household.  Patient was exposed to Covid on Monday, 08/16/21; Regina Mcmahon's sister took her to radiation, sister had symptoms on Tuesday , she did a home test which  was positive.  Regina Mcmahon had Pre- Surgery Covid test this after noon. I sent Dr. Lamonte Sakai a [facility] staff note regarding Regina Mcmahon's exposure.  I went over Regina Mcmahon's home medication list, Potassium 10 meq was listed, patient said that she is not taking the 10 meq.  Regina Mcmahon reported that when she had hip surgery in August 2022, Potassium was critically low at pre -surgery testing, she  was started on 20 meq's of Potassium once a day. Patient said she is out of  20 meq and has to call the pharmacy to get it refilled. 10 meq of potassium 10 meq is listed on Pre admission medication list. Regina Mcmahon is on HCTZ and Lasix- daily.  Regina Mcmahon had labs drawn on 08/10/21, Potasium was normal, patient ran out of potassium the same week. I encouraged Regina Mcmahon to call the pharmacy to see if they can refill the 61meg Potassium, in the mean time, I instructed patient to take 2 -15meq  tablets of Potassium daily, patient said she will. I informed Garen Grams , PA-C about patient not taking potassium.  I instructed Regina Mcmahon to hold Advil. PCP is Dr. Casimer Leek.  I instructed Regina Mcmahon shower with antibiotic soap, if it is available.  Dry off with a clean towel. Do not put lotion, powder, cologne or deodorant or makeup.No jewelry or piercings. Men may shave their face and neck. Woman should not shave. No nail polish, artificial or acrylic nails. Wear clean clothes, brush your teeth. Glasses, contact lens,dentures or partials may not be worn in the OR. If you need to wear them, please bring a case for glasses, do not wear contacts or bring a case, the hospital does not have contact cases, dentures or partials will have to be removed , make sure they are clean, we will provide a denture cup  to put them in. You will need some one to drive you home and a responsible person over the age of 31 to stay with you for the first 24 hours after surgery.

## 2021-08-23 ENCOUNTER — Ambulatory Visit (HOSPITAL_COMMUNITY): Payer: Medicare HMO

## 2021-08-23 ENCOUNTER — Ambulatory Visit: Payer: Medicare HMO | Admitting: Radiation Oncology

## 2021-08-23 ENCOUNTER — Ambulatory Visit (HOSPITAL_COMMUNITY): Payer: Medicare HMO | Admitting: Vascular Surgery

## 2021-08-23 ENCOUNTER — Other Ambulatory Visit: Payer: Self-pay

## 2021-08-23 ENCOUNTER — Ambulatory Visit: Payer: Medicare HMO

## 2021-08-23 ENCOUNTER — Encounter (HOSPITAL_COMMUNITY)
Admission: RE | Disposition: A | Discharge status: Home / Self Care | Payer: Self-pay | Source: Home / Self Care | Attending: Emergency Medicine

## 2021-08-23 ENCOUNTER — Ambulatory Visit (HOSPITAL_COMMUNITY)
Admission: RE | Admit: 2021-08-23 | Discharge: 2021-08-23 | Disposition: A | Discharge status: Home / Self Care | Payer: Medicare HMO | Attending: Emergency Medicine | Admitting: Emergency Medicine

## 2021-08-23 ENCOUNTER — Encounter (HOSPITAL_COMMUNITY): Payer: Self-pay | Admitting: Emergency Medicine

## 2021-08-23 DIAGNOSIS — Z20822 Contact with and (suspected) exposure to covid-19: Secondary | ICD-10-CM | POA: Insufficient documentation

## 2021-08-23 DIAGNOSIS — I119 Hypertensive heart disease without heart failure: Secondary | ICD-10-CM | POA: Insufficient documentation

## 2021-08-23 DIAGNOSIS — Z853 Personal history of malignant neoplasm of breast: Secondary | ICD-10-CM | POA: Diagnosis not present

## 2021-08-23 DIAGNOSIS — R69 Illness, unspecified: Secondary | ICD-10-CM | POA: Diagnosis not present

## 2021-08-23 DIAGNOSIS — F32A Depression, unspecified: Secondary | ICD-10-CM | POA: Diagnosis not present

## 2021-08-23 DIAGNOSIS — R911 Solitary pulmonary nodule: Secondary | ICD-10-CM | POA: Diagnosis not present

## 2021-08-23 DIAGNOSIS — J439 Emphysema, unspecified: Secondary | ICD-10-CM | POA: Insufficient documentation

## 2021-08-23 DIAGNOSIS — C3432 Malignant neoplasm of lower lobe, left bronchus or lung: Secondary | ICD-10-CM | POA: Diagnosis not present

## 2021-08-23 DIAGNOSIS — Z6837 Body mass index (BMI) 37.0-37.9, adult: Secondary | ICD-10-CM | POA: Insufficient documentation

## 2021-08-23 DIAGNOSIS — C711 Malignant neoplasm of frontal lobe: Secondary | ICD-10-CM | POA: Diagnosis not present

## 2021-08-23 DIAGNOSIS — Z923 Personal history of irradiation: Secondary | ICD-10-CM | POA: Insufficient documentation

## 2021-08-23 DIAGNOSIS — R918 Other nonspecific abnormal finding of lung field: Secondary | ICD-10-CM

## 2021-08-23 DIAGNOSIS — R9389 Abnormal findings on diagnostic imaging of other specified body structures: Secondary | ICD-10-CM | POA: Diagnosis not present

## 2021-08-23 DIAGNOSIS — K219 Gastro-esophageal reflux disease without esophagitis: Secondary | ICD-10-CM | POA: Diagnosis not present

## 2021-08-23 DIAGNOSIS — R590 Localized enlarged lymph nodes: Secondary | ICD-10-CM | POA: Diagnosis not present

## 2021-08-23 DIAGNOSIS — I10 Essential (primary) hypertension: Secondary | ICD-10-CM | POA: Diagnosis not present

## 2021-08-23 DIAGNOSIS — F419 Anxiety disorder, unspecified: Secondary | ICD-10-CM | POA: Insufficient documentation

## 2021-08-23 DIAGNOSIS — C801 Malignant (primary) neoplasm, unspecified: Secondary | ICD-10-CM | POA: Diagnosis not present

## 2021-08-23 DIAGNOSIS — Z419 Encounter for procedure for purposes other than remedying health state, unspecified: Secondary | ICD-10-CM

## 2021-08-23 DIAGNOSIS — C771 Secondary and unspecified malignant neoplasm of intrathoracic lymph nodes: Secondary | ICD-10-CM | POA: Insufficient documentation

## 2021-08-23 DIAGNOSIS — C320 Malignant neoplasm of glottis: Secondary | ICD-10-CM | POA: Insufficient documentation

## 2021-08-23 DIAGNOSIS — E669 Obesity, unspecified: Secondary | ICD-10-CM | POA: Insufficient documentation

## 2021-08-23 DIAGNOSIS — J449 Chronic obstructive pulmonary disease, unspecified: Secondary | ICD-10-CM | POA: Diagnosis not present

## 2021-08-23 DIAGNOSIS — I517 Cardiomegaly: Secondary | ICD-10-CM | POA: Diagnosis not present

## 2021-08-23 DIAGNOSIS — Z9889 Other specified postprocedural states: Secondary | ICD-10-CM

## 2021-08-23 HISTORY — DX: Pneumonia, unspecified organism: J18.9

## 2021-08-23 HISTORY — PX: BRONCHIAL WASHINGS: SHX5105

## 2021-08-23 HISTORY — PX: BRONCHIAL BIOPSY: SHX5109

## 2021-08-23 HISTORY — PX: VIDEO BRONCHOSCOPY WITH RADIAL ENDOBRONCHIAL ULTRASOUND: SHX6849

## 2021-08-23 HISTORY — DX: Malignant (primary) neoplasm, unspecified: C80.1

## 2021-08-23 HISTORY — DX: Unspecified osteoarthritis, unspecified site: M19.90

## 2021-08-23 HISTORY — PX: BRONCHIAL NEEDLE ASPIRATION BIOPSY: SHX5106

## 2021-08-23 HISTORY — DX: Varicose veins of bilateral lower extremities with pain: I83.813

## 2021-08-23 HISTORY — PX: BRONCHIAL BRUSHINGS: SHX5108

## 2021-08-23 HISTORY — PX: VIDEO BRONCHOSCOPY WITH ENDOBRONCHIAL ULTRASOUND: SHX6177

## 2021-08-23 LAB — SARS CORONAVIRUS 2 BY RT PCR (HOSPITAL ORDER, PERFORMED IN ~~LOC~~ HOSPITAL LAB): SARS Coronavirus 2: NEGATIVE

## 2021-08-23 LAB — SARS CORONAVIRUS 2 (TAT 6-24 HRS): SARS Coronavirus 2: NEGATIVE

## 2021-08-23 IMAGING — DX DG CHEST 1V PORT
1 series · 1 of 1 positions shown · non-contrast
Comparison: [DATE]

CLINICAL DATA: Status post bronchoscopy and biopsy

EXAM:
PORTABLE CHEST 1 VIEW

[chest ap]
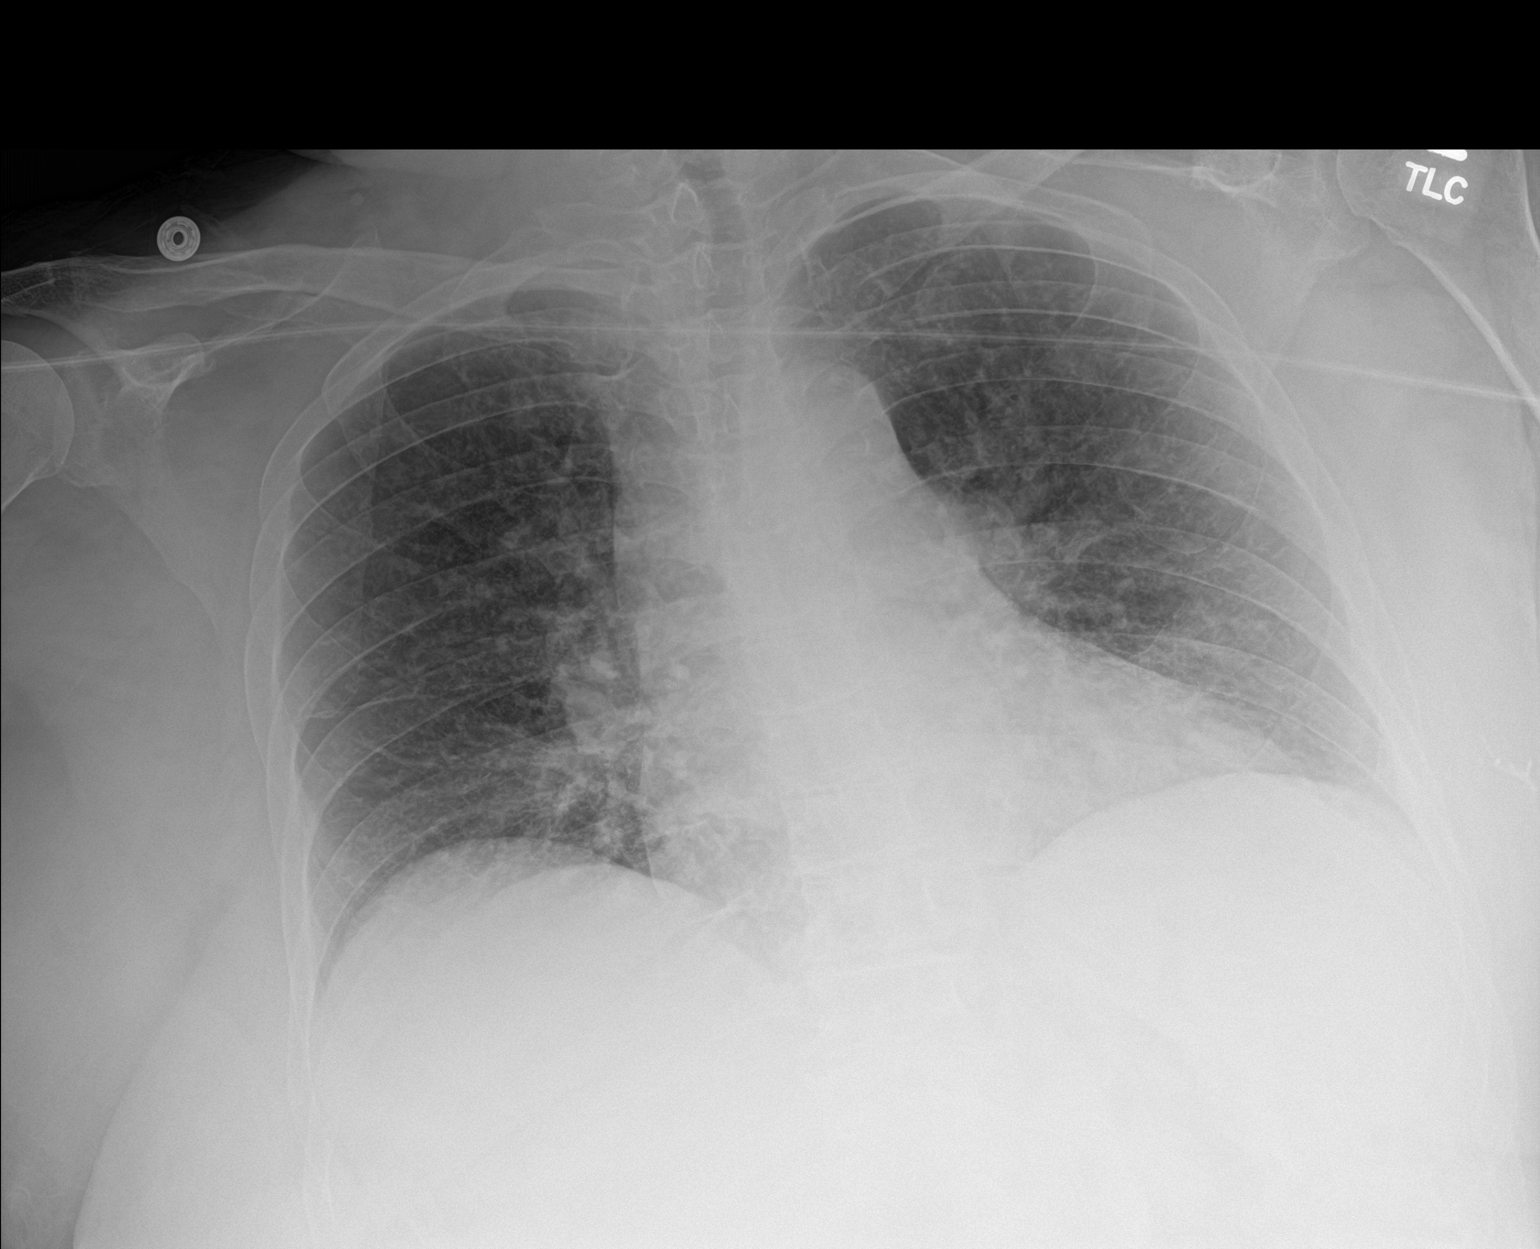

[1 of 1 positions shown; findings below may reference images not displayed]

FINDINGS: Cardiomegaly. Mild, diffuse interstitial opacity, unchanged. The
visualized skeletal structures are unremarkable.
IMPRESSION: 1. Cardiomegaly. Mild, diffuse interstitial opacity, unchanged,
likely mild edema. No focal airspace opacity.

2.  No pneumothorax.

## 2021-08-23 SURGERY — BRONCHOSCOPY, WITH BIOPSY USING ELECTROMAGNETIC NAVIGATION
Anesthesia: General

## 2021-08-23 MED ORDER — DEXAMETHASONE SODIUM PHOSPHATE 4 MG/ML IJ SOLN
INTRAMUSCULAR | Status: DC | PRN
  Administered 2021-08-23: 10 mg via INTRAVENOUS

## 2021-08-23 MED ORDER — MIDAZOLAM HCL 5 MG/5ML IJ SOLN
INTRAMUSCULAR | Status: DC | PRN
  Administered 2021-08-23: 2 mg via INTRAVENOUS

## 2021-08-23 MED ORDER — PROPOFOL 10 MG/ML IV BOLUS
INTRAVENOUS | Status: DC | PRN
  Administered 2021-08-23: 50 mg via INTRAVENOUS
  Administered 2021-08-23: 60 mg via INTRAVENOUS
  Administered 2021-08-23: 140 mg via INTRAVENOUS

## 2021-08-23 MED ORDER — ROCURONIUM BROMIDE 10 MG/ML (PF) SYRINGE
PREFILLED_SYRINGE | INTRAVENOUS | Status: DC | PRN
  Administered 2021-08-23: 50 mg via INTRAVENOUS
  Administered 2021-08-23: 20 mg via INTRAVENOUS

## 2021-08-23 MED ORDER — ONDANSETRON HCL 4 MG/2ML IJ SOLN
INTRAMUSCULAR | Status: DC | PRN
  Administered 2021-08-23: 4 mg via INTRAVENOUS

## 2021-08-23 MED ORDER — SUCCINYLCHOLINE CHLORIDE 200 MG/10ML IV SOSY
PREFILLED_SYRINGE | INTRAVENOUS | Status: DC | PRN
  Administered 2021-08-23: 160 mg via INTRAVENOUS

## 2021-08-23 MED ORDER — FENTANYL CITRATE (PF) 100 MCG/2ML IJ SOLN
INTRAMUSCULAR | Status: DC | PRN
  Administered 2021-08-23: 100 ug via INTRAVENOUS

## 2021-08-23 MED ORDER — SUGAMMADEX SODIUM 200 MG/2ML IV SOLN
INTRAVENOUS | Status: DC | PRN
  Administered 2021-08-23: 200 mg via INTRAVENOUS

## 2021-08-23 MED ORDER — LACTATED RINGERS IV SOLN: INTRAVENOUS | Status: DC

## 2021-08-23 MED ORDER — LIDOCAINE HCL (CARDIAC) PF 100 MG/5ML IV SOSY
PREFILLED_SYRINGE | INTRAVENOUS | Status: DC | PRN
  Administered 2021-08-23: 60 mg via INTRAVENOUS

## 2021-08-23 MED ORDER — CHLORHEXIDINE GLUCONATE 0.12 % MT SOLN
15.0000 mL | Freq: Once | OROMUCOSAL | Status: AC
  Administered 2021-08-23: 15 mL via OROMUCOSAL

## 2021-08-23 MED ORDER — PROPOFOL 500 MG/50ML IV EMUL
INTRAVENOUS | Status: DC | PRN
  Administered 2021-08-23: 100 ug/kg/min via INTRAVENOUS

## 2021-08-23 NOTE — Interval H&P Note (Signed)
History and Physical Interval Note:  08/23/2021 7:26 AM  Regina Mcmahon  has presented today for surgery, with the diagnosis of MEDIASTINAL ADENOPATHY, LEFT PULMONDARY NODULE.  The various methods of treatment have been discussed with the patient and family. After consideration of risks, benefits and other options for treatment, the patient has consented to  Procedure(s): ROBOTIC ASSISTED NAVIGATIONAL BRONCHOSCOPY (N/A) VIDEO BRONCHOSCOPY WITH ENDOBRONCHIAL ULTRASOUND (N/A) as a surgical intervention.  The patient's history has been reviewed, patient examined, no change in status, stable for surgery.  I have reviewed the patient's chart and labs.  Questions were answered to the patient's satisfaction.     Collene Gobble

## 2021-08-23 NOTE — Op Note (Signed)
Video Bronchoscopy with Robotic Assisted Bronchoscopic Navigation and Endobronchial Ultrasound Procedure Note  Date of Operation: 08/23/2021   Pre-op Diagnosis: Left lower lobe pulmonary nodule, mediastinal adenopathy  Post-op Diagnosis: Same  Surgeon: Baltazar Apo  Assistants: None  Anesthesia: General endotracheal anesthesia  Operation: Flexible video fiberoptic bronchoscopy with robotic assistance and biopsies.  Estimated Blood Loss: Minimal  Complications: None  Indications and History: Regina Mcmahon is a 71 y.o. female with history of breast cancer, newly diagnosed squamous cell cancer of the vocal cord.  She was found to have mediastinal and hilar adenopathy, left lower lobe pulmonary nodule on PET scan.  Recommendation made to achieve a tissue diagnosis via navigational bronchoscopy, endobronchial ultrasound and biopsies. The risks, benefits, complications, treatment options and expected outcomes were discussed with the patient.  The possibilities of pneumothorax, pneumonia, reaction to medication, pulmonary aspiration, perforation of a viscus, bleeding, failure to diagnose a condition and creating a complication requiring transfusion or operation were discussed with the patient who freely signed the consent.    Description of Procedure: The patient was seen in the Preoperative Area, was examined and was deemed appropriate to proceed.  The patient was taken to [facility] endoscopy room 3, identified as Bronson Ing and the procedure verified as Flexible Video Fiberoptic Bronchoscopy.  A Time Out was held and the above information confirmed.   Prior to the date of the procedure a high-resolution CT scan of the chest was performed. Utilizing ION software program a virtual tracheobronchial tree was generated to allow the creation of distinct navigation pathways to the patient's parenchymal abnormalities. After being taken to the operating room general anesthesia was initiated and the  patient  was orally intubated. The video fiberoptic bronchoscope was introduced via the endotracheal tube and a general inspection was performed which showed normal right and left lung anatomy, aspiration of the bilateral mainstems was completed to remove any remaining secretions. Robotic catheter inserted into patient's endotracheal tube.   Target #1 left lower lobe pulmonary nodule: The distinct navigation pathways prepared prior to this procedure were then utilized to navigate to patient's lesion identified on CT scan. The robotic catheter was secured into place and the vision probe was withdrawn.  Lesion location was approximated using fluoroscopy and radial endobronchial ultrasound for peripheral targeting.  Local registration and targeting was achieved using  Cios three-dimensional imaging.  Under fluoroscopic guidance transbronchial needle brushings, transbronchial needle biopsies, and transbronchial forceps biopsies were performed to be sent for cytology and pathology. A bronchioalveolar lavage was performed in the left lower lobe and sent for cytology.  The robotic scope was then withdrawn and the endobronchial ultrasound was used to identify and characterize the peritracheal, hilar and bronchial lymph nodes. Inspection showed bulky adenopathy at 4R and 7. Using real-time ultrasound guidance Wang needle biopsies were take from Station 4R and 7 nodes and were sent for cytology. The patient tolerated the procedure well without apparent complications.   At the end of the procedure a general airway inspection was performed and there was no evidence of active bleeding. The bronchoscope was removed.  The patient tolerated the procedure well. There was no significant blood loss and there were no obvious complications. A post-procedural chest x-ray is pending.  Samples Target #1: 1. Transbronchial needle brushings from left lower lobe nodule 2. Transbronchial Wang needle biopsies from left lower lobe  nodule 3. Transbronchial forceps biopsies from left lower lobe nodule 4. Bronchoalveolar lavage from left lower lobe  EBUS samples: 1. Wang needle biopsies  from 4R node 2. Wang needle biopsies from 7 node   Plans:  The patient will be discharged from the PACU to home when recovered from anesthesia and after chest x-ray is reviewed. We will review the cytology, pathology and microbiology results with the patient when they become available. Outpatient followup will be with Dr. Lamonte Sakai, Dr. Isidore Moos and Dr . Chryl Heck.    Baltazar Apo, MD, PhD 08/23/2021, 9:40 AM [facility] [phone] or if no answer before 7:00PM call [phone] For any issues after 7:00PM please call eLink [phone]

## 2021-08-23 NOTE — Anesthesia Procedure Notes (Signed)
Procedure Name: Intubation Date/Time: 08/23/2021 7:54 AM Performed by: Lieutenant Diego, CRNA Pre-anesthesia Checklist: Patient identified, Emergency Drugs available, Suction available and Patient being monitored Patient Re-evaluated:Patient Re-evaluated prior to induction Oxygen Delivery Method: Circle system utilized Preoxygenation: Pre-oxygenation with 100% oxygen Induction Type: IV induction Ventilation: Mask ventilation without difficulty Laryngoscope Size: Miller and 2 Grade View: Grade I Tube type: Oral Tube size: 8.5 mm Number of attempts: 1 Airway Equipment and Method: Stylet and Oral airway Placement Confirmation: ETT inserted through vocal cords under direct vision, positive ETCO2 and breath sounds checked- equal and bilateral Secured at: 23 cm Tube secured with: Tape Dental Injury: Teeth and Oropharynx as per pre-operative assessment

## 2021-08-23 NOTE — Transfer of Care (Signed)
Immediate Anesthesia Transfer of Care Note  Patient: Regina Mcmahon  Procedure(s) Performed: ROBOTIC ASSISTED NAVIGATIONAL BRONCHOSCOPY VIDEO BRONCHOSCOPY WITH ENDOBRONCHIAL ULTRASOUND VIDEO BRONCHOSCOPY WITH RADIAL ENDOBRONCHIAL ULTRASOUND BRONCHIAL NEEDLE ASPIRATION BIOPSIES BRONCHIAL BIOPSIES BRONCHIAL BRUSHINGS BRONCHIAL WASHINGS  Patient Location: PACU  Anesthesia Type:General  Level of Consciousness: awake and alert   Airway & Oxygen Therapy: Patient Spontanous Breathing and Patient connected to face mask oxygen  Post-op Assessment: Report given to RN and Post -op Vital signs reviewed and stable  Post vital signs: Reviewed and stable  Last Vitals:  Vitals Value Taken Time  BP 203/65 08/23/21 0947  Temp    Pulse 80 08/23/21 0948  Resp 18 08/23/21 0948  SpO2 92 % 08/23/21 0948  Vitals shown include unvalidated device data.  Last Pain:  Vitals:   08/23/21 0621  TempSrc: Oral  PainSc:          Complications: No notable events documented.

## 2021-08-23 NOTE — Discharge Instructions (Signed)
Flexible Bronchoscopy, Care After This sheet gives you information about how to care for yourself after your test. Your doctor may also give you more specific instructions. If you have problems or questions, contact your doctor. Follow these instructions at home: Eating and drinking Do not eat or drink anything (not even water) for 2 hours after your test, or until your numbing medicine (local anesthetic) wears off. When your numbness is gone and your cough and gag reflexes have come back, you may: Eat only soft foods. Slowly drink liquids. The day after the test, go back to your normal diet. Driving Do not drive for 24 hours if you were given a medicine to help you relax (sedative). Do not drive or use heavy machinery while taking prescription pain medicine. General instructions  Take over-the-counter and prescription medicines only as told by your doctor. Return to your normal activities as told. Ask what activities are safe for you. Do not use any products that have nicotine or tobacco in them. This includes cigarettes and e-cigarettes. If you need help quitting, ask your doctor. Keep all follow-up visits as told by your doctor. This is important. It is very important if you had a tissue sample (biopsy) taken. Get help right away if: You have shortness of breath that gets worse. You get light-headed. You feel like you are going to pass out (faint). You have chest pain. You cough up: More than a little blood. More blood than before. Summary Do not eat or drink anything (not even water) for 2 hours after your test, or until your numbing medicine wears off. Do not use cigarettes. Do not use e-cigarettes. Get help right away if you have chest pain.  Please call our office for any questions or concerns.  [phone].  This information is not intended to replace advice given to you by your health care provider. Make sure you discuss any questions you have with your health care  provider. Document Released: 05/08/2009 Document Revised: 06/23/2017 Document Reviewed: 07/29/2016 Elsevier Patient Education  2020 [employer].

## 2021-08-23 NOTE — Therapy (Signed)
OUTPATIENT PHYSICAL THERAPY HEAD AND NECK BASELINE EVALUATION   Patient Name: Regina Mcmahon MRN: [id number] DOB:Apr 07, 1951, 71 y.o., female Today's Date: 08/23/2021    Past Medical History:  Diagnosis Date   Arthritis    Asthma    Asymptomatic varicose veins    Breast cancer ([place]) 06/2014   left   Cancer ([place])    Supra Glottis   Chronic airway obstruction ([place])    Complication of anesthesia 2016   difficulty waking up, Oxygen dropped low.   COPD (chronic obstructive pulmonary disease) ([facility])    Depression    GERD (gastroesophageal reflux disease)    Hemorrhage rectum    and anus.   Hypertension    Insomnia    Other symptoms involving digestive system(787.99)    Pain    Hx.pain in joint involving pelvic region and thigh; pain in limb   Palpitations    Panic attacks    Personal history of chemotherapy 2016   Personal history of radiation therapy 2016   Pneumonia    Pre-diabetes    Sinusitis    Symptomatic menopausal or female climacteric states    Varicose veins of both lower extremities with pain    Past Surgical History:  Procedure Laterality Date   BREAST BIOPSY Left 10/2012   BREAST BIOPSY Left 07/01/2014   malignant   BREAST LUMPECTOMY Left 08/20/2014   CESAREAN SECTION     CHOLECYSTECTOMY  1985   COLONOSCOPY  2002   Dr. Lindalou Hose: internal hemorrhoids, one small rectal polyp, adenomatous path    COLONOSCOPY N/A 11/23/2015   Procedure: COLONOSCOPY;  Surgeon: Danie Binder, MD;  [facility];  Service: Endoscopy;  Laterality: N/A;  1100 - moved to 10:45 - office to notify   ESOPHAGOGASTRODUODENOSCOPY N/A 11/23/2015   Procedure: ESOPHAGOGASTRODUODENOSCOPY (EGD);  Surgeon: Danie Binder, MD;  [facility];  Service: Endoscopy;  Laterality: N/A;   PORT-A-CATH REMOVAL  02/2015   PORTACATH PLACEMENT Right 09/17/2014   Procedure: INSERTION PORT-A-CATH WITH ULTRASOUND;  Surgeon: Erroll Luna, MD;  Location: [facility];  Service:  General;  Laterality: Right;  IJ   RADIOACTIVE SEED GUIDED PARTIAL MASTECTOMY WITH AXILLARY SENTINEL LYMPH NODE BIOPSY Left 08/20/2014   Procedure: LEFT BREAST LUMPECTOMY WITH RADIOACTIVE SEED LOCALIZATION AND SENTINEL LYMPH NODE MAPPING;  Surgeon: Erroll Luna, MD;  Location: [facility];  Service: General;  Laterality: Left;   RE-EXCISION OF BREAST LUMPECTOMY Left 09/17/2014   Procedure: RE-EXCISION OF BREAST LUMPECTOMY;  Surgeon: Erroll Luna, MD;  Location: [facility];  Service: General;  Laterality: Left;   TOTAL HIP ARTHROPLASTY Right 02/22/2021   Procedure: RIGHT TOTAL HIP ARTHROPLASTY ANTERIOR APPROACH;  Surgeon: Marybelle Killings, MD;  Location: [facility];  Service: Orthopedics;  Laterality: Right;   TUBAL LIGATION     Patient Active Problem List   Diagnosis Date Noted   Pulmonary nodules 08/23/2021   Mediastinal adenopathy 08/06/2021   Thyroid nodule 08/06/2021   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   Abnormal CT of the chest 07/29/2021   History of total right hip arthroplasty 04/15/2021   COPD GOLD II  /  still smoking  10/12/2020   Shortness of breath 09/12/2016   Well woman exam with routine gynecological exam 04/20/2016   Morbid obesity due to excess calories ([place]) 03/03/2016   Colon adenomas 03/03/2016   Rash 01/18/2016   GERD (gastroesophageal reflux disease) 10/19/2015   Osteopenia determined by x-ray 09/07/2015   High risk medication use  09/07/2015   Genetic testing 03/20/2015   Family history of ovarian cancer 03/20/2015   Family history of colon cancer 03/20/2015   Family history of cancer 03/20/2015   Carcinoma of upper-outer quadrant of left female breast ([place]) 09/03/2014   Anxiety 10/29/2012   Blood in stool 10/29/2012   Pain 10/29/2012   Chronic airway obstruction ([place]) 10/29/2012   Diarrhea 10/29/2012   Cigarette smoker 10/29/2012   Essential hypertension 10/29/2012    PCP: [name], MD  REFERRING PROVIDER:  Eppie Gibson, MD  REFERRING DIAG: Glottis carcinoma  THERAPY DIAG:  No diagnosis found.  ONSET DATE: 06/25/21  SUBJECTIVE    SUBJECTIVE STATEMENT: Pt very tearful as she was just diagnosed with lung cancer last night.   PERTINENT HISTORY:  Glottis carcinoma, (T1, N1), developed hoarseness that never got better after hip replacement surgery August of 2022. 06/07/21 She saw Dr. Redmond Baseman who performed a laryngoscopy which revealed a granular mass of the right glottis suggestive of glottic cancer. 06/23/21 CT neck revealed revealed a 9 mm nodule located about the right vocal cord, consistent with history of glottic carcinoma. No enlarged lymph nodes were appreciated in the neck. An enlarged right paratracheal lymph node was appreciated as well, noted as suspicious for metastatic disease, as well as the right thyroid nodule measuring 39 x 34 mm. 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  07/23/21 PET demonstrated the small right glottic lesion as mildly hypermetabolic and consistent with known neoplasm. PET also demonstrated: a borderline right level 2 lymph node; left supraclavicular, mediastinal and hilar lymphadenopathy; and a hypermetabolic left lower lobe pulmonary nodule, noted as likely a metastatic focus. No other findings for abdominal/pelvic metastatic disease or osseous metastatic disease were appreciated. Will receive 20 fractions of radiation to her Larynx with a decreased dose to her right neck. She started radiation treatment on 08/16/21 and will complete on 09/13/21. Also hx of stage IIB L breast cancer in 2016. Completed chemo and radiation.   ASSESSMENT:  Pt very tearful. She was diagnosed with lung cancer last night and is trying to process this new diagnosis. She was supposed to be seen today and educated about lymphedema. Decided to reschedule to next head and neck clinic since she is so tearful and unable to absorb any more information right now.  Allyson Sabal [place], [place] 08/26/21 11:35 AM

## 2021-08-24 ENCOUNTER — Ambulatory Visit
Admission: RE | Admit: 2021-08-24 | Discharge: 2021-08-24 | Disposition: A | Discharge status: Home / Self Care | Payer: Medicare HMO | Source: Ambulatory Visit | Attending: Radiation Oncology | Admitting: Radiation Oncology

## 2021-08-24 ENCOUNTER — Encounter (HOSPITAL_COMMUNITY): Payer: Self-pay | Admitting: Emergency Medicine

## 2021-08-24 DIAGNOSIS — C320 Malignant neoplasm of glottis: Secondary | ICD-10-CM | POA: Diagnosis not present

## 2021-08-24 DIAGNOSIS — Z510 Encounter for antineoplastic radiation therapy: Secondary | ICD-10-CM | POA: Diagnosis not present

## 2021-08-24 DIAGNOSIS — C321 Malignant neoplasm of supraglottis: Secondary | ICD-10-CM | POA: Diagnosis not present

## 2021-08-24 LAB — CYTOLOGY - NON PAP

## 2021-08-25 ENCOUNTER — Telehealth: Payer: Self-pay | Admitting: Emergency Medicine

## 2021-08-25 ENCOUNTER — Other Ambulatory Visit: Payer: Self-pay | Admitting: Radiation Oncology

## 2021-08-25 ENCOUNTER — Ambulatory Visit
Admission: RE | Admit: 2021-08-25 | Discharge: 2021-08-25 | Disposition: A | Discharge status: Home / Self Care | Payer: Medicare HMO | Source: Ambulatory Visit | Attending: Radiation Oncology | Admitting: Radiation Oncology

## 2021-08-25 ENCOUNTER — Other Ambulatory Visit: Payer: Self-pay

## 2021-08-25 DIAGNOSIS — C321 Malignant neoplasm of supraglottis: Secondary | ICD-10-CM | POA: Diagnosis not present

## 2021-08-25 DIAGNOSIS — C320 Malignant neoplasm of glottis: Secondary | ICD-10-CM | POA: Diagnosis not present

## 2021-08-25 DIAGNOSIS — Z510 Encounter for antineoplastic radiation therapy: Secondary | ICD-10-CM | POA: Insufficient documentation

## 2021-08-25 MED ORDER — LIDOCAINE VISCOUS HCL 2 % MT SOLN: OROMUCOSAL | 3 refills | Status: DC

## 2021-08-25 NOTE — Telephone Encounter (Signed)
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  Presume that this is distinct primary from her vocal cord lesion but possible that they could be connected. She had many questions about the diagnosis, prognosis, options for therapy. I attempted to answer to the best of my ability.   Will send this information FYI to Dr. Isidore Moos and Dr. Chryl Heck to continue to plan her therapy.

## 2021-08-26 ENCOUNTER — Ambulatory Visit
Admission: RE | Admit: 2021-08-26 | Discharge: 2021-08-26 | Disposition: A | Discharge status: Home / Self Care | Payer: Medicare HMO | Source: Ambulatory Visit | Attending: Radiation Oncology | Admitting: Radiation Oncology

## 2021-08-26 ENCOUNTER — Ambulatory Visit: Payer: Medicare HMO

## 2021-08-26 ENCOUNTER — Ambulatory Visit: Payer: Medicare HMO | Attending: Radiation Oncology | Admitting: Physical Therapy

## 2021-08-26 ENCOUNTER — Encounter: Payer: Medicare HMO | Admitting: Nutrition

## 2021-08-26 ENCOUNTER — Encounter: Payer: Self-pay | Admitting: Physical Therapy

## 2021-08-26 DIAGNOSIS — Z510 Encounter for antineoplastic radiation therapy: Secondary | ICD-10-CM | POA: Diagnosis not present

## 2021-08-26 DIAGNOSIS — C320 Malignant neoplasm of glottis: Secondary | ICD-10-CM | POA: Diagnosis not present

## 2021-08-26 DIAGNOSIS — C321 Malignant neoplasm of supraglottis: Secondary | ICD-10-CM | POA: Diagnosis not present

## 2021-08-26 DIAGNOSIS — R1310 Dysphagia, unspecified: Secondary | ICD-10-CM | POA: Insufficient documentation

## 2021-08-26 DIAGNOSIS — R293 Abnormal posture: Secondary | ICD-10-CM | POA: Insufficient documentation

## 2021-08-26 NOTE — Progress Notes (Signed)
Oncology Nurse Navigator Documentation   Regina Mcmahon was scheduled to see PT and Speech Therapy today as part of head and neck MDC. She was very emotional today after receiving results of her biopsy from Dr. Lamonte Sakai yesterday evening. She was very tearful and worried about future treatment plans related to her newly found lung cancer. She was not able to concentrate during her appointments and asked to postpone them to another time. She has been rescheduled with Speech Therapy on 2/7 and will see PT at the next [facility] on 2/16. She is agreeable to this plan and appreciative of our willingness to reschedule. She knows to call me if she has any questions or concerns.   Harlow Asa RN, BSN, OCN Head & Neck Oncology Nurse [facility] Phone # [phone]  Fax # [phone]

## 2021-08-26 NOTE — Anesthesia Postprocedure Evaluation (Signed)
Anesthesia Post Note  Patient: Regina Mcmahon  Procedure(s) Performed: ROBOTIC ASSISTED NAVIGATIONAL BRONCHOSCOPY VIDEO BRONCHOSCOPY WITH ENDOBRONCHIAL ULTRASOUND VIDEO BRONCHOSCOPY WITH RADIAL ENDOBRONCHIAL ULTRASOUND BRONCHIAL NEEDLE ASPIRATION BIOPSIES BRONCHIAL BIOPSIES BRONCHIAL BRUSHINGS BRONCHIAL WASHINGS     Patient location during evaluation: PACU Anesthesia Type: General Level of consciousness: awake and alert Pain management: pain level controlled Vital Signs Assessment: post-procedure vital signs reviewed and stable Respiratory status: spontaneous breathing, nonlabored ventilation, respiratory function stable and patient connected to nasal cannula oxygen Cardiovascular status: blood pressure returned to baseline and stable Postop Assessment: no apparent nausea or vomiting Anesthetic complications: no   No notable events documented.  Last Vitals:  Vitals:   08/23/21 1017 08/23/21 1029  BP: (!) 144/71 (!) 142/68  Pulse: 77 73  Resp: 18 16  Temp: 36.7 C   SpO2: 93% 95%    Last Pain:  Vitals:   08/23/21 1017  TempSrc:   PainSc: 0-No pain                 Jiah Bari

## 2021-08-27 ENCOUNTER — Ambulatory Visit
Admission: RE | Admit: 2021-08-27 | Discharge: 2021-08-27 | Disposition: A | Discharge status: Home / Self Care | Payer: Medicare HMO | Source: Ambulatory Visit | Attending: Radiation Oncology | Admitting: Radiation Oncology

## 2021-08-27 ENCOUNTER — Other Ambulatory Visit: Payer: Self-pay

## 2021-08-27 DIAGNOSIS — C320 Malignant neoplasm of glottis: Secondary | ICD-10-CM | POA: Diagnosis not present

## 2021-08-27 DIAGNOSIS — Z510 Encounter for antineoplastic radiation therapy: Secondary | ICD-10-CM | POA: Diagnosis not present

## 2021-08-27 DIAGNOSIS — C321 Malignant neoplasm of supraglottis: Secondary | ICD-10-CM | POA: Diagnosis not present

## 2021-08-28 DIAGNOSIS — C3490 Malignant neoplasm of unspecified part of unspecified bronchus or lung: Secondary | ICD-10-CM | POA: Diagnosis not present

## 2021-08-30 ENCOUNTER — Other Ambulatory Visit: Payer: Self-pay | Admitting: Radiation Therapy

## 2021-08-30 ENCOUNTER — Ambulatory Visit
Admission: RE | Admit: 2021-08-30 | Discharge: 2021-08-30 | Disposition: A | Discharge status: Home / Self Care | Payer: Medicare HMO | Source: Ambulatory Visit | Attending: Radiation Oncology | Admitting: Radiation Oncology

## 2021-08-30 ENCOUNTER — Other Ambulatory Visit: Payer: Self-pay

## 2021-08-30 ENCOUNTER — Other Ambulatory Visit: Payer: Self-pay | Admitting: Radiation Oncology

## 2021-08-30 ENCOUNTER — Inpatient Hospital Stay: Payer: Medicare HMO | Attending: Hematology and Oncology | Admitting: Hematology and Oncology

## 2021-08-30 DIAGNOSIS — Z170 Estrogen receptor positive status [ER+]: Secondary | ICD-10-CM | POA: Diagnosis not present

## 2021-08-30 DIAGNOSIS — Z87891 Personal history of nicotine dependence: Secondary | ICD-10-CM | POA: Diagnosis not present

## 2021-08-30 DIAGNOSIS — Z923 Personal history of irradiation: Secondary | ICD-10-CM | POA: Diagnosis not present

## 2021-08-30 DIAGNOSIS — Z800 Family history of malignant neoplasm of digestive organs: Secondary | ICD-10-CM | POA: Insufficient documentation

## 2021-08-30 DIAGNOSIS — C3490 Malignant neoplasm of unspecified part of unspecified bronchus or lung: Secondary | ICD-10-CM

## 2021-08-30 DIAGNOSIS — C7951 Secondary malignant neoplasm of bone: Secondary | ICD-10-CM | POA: Diagnosis not present

## 2021-08-30 DIAGNOSIS — Z836 Family history of other diseases of the respiratory system: Secondary | ICD-10-CM | POA: Diagnosis not present

## 2021-08-30 DIAGNOSIS — Z809 Family history of malignant neoplasm, unspecified: Secondary | ICD-10-CM | POA: Insufficient documentation

## 2021-08-30 DIAGNOSIS — Z8051 Family history of malignant neoplasm of kidney: Secondary | ICD-10-CM | POA: Diagnosis not present

## 2021-08-30 DIAGNOSIS — Z833 Family history of diabetes mellitus: Secondary | ICD-10-CM | POA: Diagnosis not present

## 2021-08-30 DIAGNOSIS — C321 Malignant neoplasm of supraglottis: Secondary | ICD-10-CM | POA: Insufficient documentation

## 2021-08-30 DIAGNOSIS — Z808 Family history of malignant neoplasm of other organs or systems: Secondary | ICD-10-CM | POA: Insufficient documentation

## 2021-08-30 DIAGNOSIS — Z79899 Other long term (current) drug therapy: Secondary | ICD-10-CM | POA: Insufficient documentation

## 2021-08-30 DIAGNOSIS — Z806 Family history of leukemia: Secondary | ICD-10-CM | POA: Diagnosis not present

## 2021-08-30 DIAGNOSIS — C320 Malignant neoplasm of glottis: Secondary | ICD-10-CM | POA: Diagnosis not present

## 2021-08-30 DIAGNOSIS — Z8041 Family history of malignant neoplasm of ovary: Secondary | ICD-10-CM | POA: Insufficient documentation

## 2021-08-30 DIAGNOSIS — C50412 Malignant neoplasm of upper-outer quadrant of left female breast: Secondary | ICD-10-CM | POA: Diagnosis not present

## 2021-08-30 DIAGNOSIS — C3432 Malignant neoplasm of lower lobe, left bronchus or lung: Secondary | ICD-10-CM | POA: Insufficient documentation

## 2021-08-30 DIAGNOSIS — Z818 Family history of other mental and behavioral disorders: Secondary | ICD-10-CM | POA: Diagnosis not present

## 2021-08-30 DIAGNOSIS — J449 Chronic obstructive pulmonary disease, unspecified: Secondary | ICD-10-CM | POA: Insufficient documentation

## 2021-08-30 DIAGNOSIS — Z8249 Family history of ischemic heart disease and other diseases of the circulatory system: Secondary | ICD-10-CM | POA: Diagnosis not present

## 2021-08-30 DIAGNOSIS — Z9221 Personal history of antineoplastic chemotherapy: Secondary | ICD-10-CM | POA: Diagnosis not present

## 2021-08-30 DIAGNOSIS — Z510 Encounter for antineoplastic radiation therapy: Secondary | ICD-10-CM | POA: Diagnosis not present

## 2021-08-30 MED ORDER — FLUCONAZOLE 100 MG PO TABS: ORAL_TABLET | ORAL | 0 refills | Status: DC

## 2021-08-30 NOTE — Progress Notes (Signed)
Physical examination deferred in lieu of counseling.  [facility] CONSULT NOTE  Patient Care Team: [name], MD as PCP - General (Internal Medicine) Herminio Commons, MD (Inactive) as PCP - Cardiology (Cardiology) Whitney Muse, Kelby Fam, MD (Inactive) as Consulting Physician (Hematology and Oncology) Erroll Luna, MD as Consulting Physician (General Surgery) Thea Silversmith, MD as Consulting Physician (Radiation Oncology) Sylvan Cheese, NP as Nurse Practitioner (Hematology and Oncology) Danie Binder, MD (Inactive) as Consulting Physician (Gastroenterology) Malmfelt, Stephani Police, RN as Oncology Nurse Navigator Melida Quitter, MD as Consulting Physician (Otolaryngology) Eppie Gibson, MD as Consulting Physician (Radiation Oncology) Benay Pike, MD as Consulting Physician (Hematology and Oncology)  CHIEF COMPLAINTS/PURPOSE OF CONSULTATION:  Newly diagnosed glottic cancer  HISTORY OF PRESENTING ILLNESS:  Regina Mcmahon 71 y.o. female is here because of recent diagnosis of new diagnosis of malignant neoplasm of glottis/supraglottis.  She had prior medical history of left breast cancer.  I reviewed her records extensively and collaborated the history with the patient.  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    06/24/2014 Breast US   Left breast: ill-defined predominantly hypoechoic area with some increased echogenicity in the 2 o'clock position of the left breast, 7 cm from the nipple. 2.6 x 2.3 x 1.8 cm in maximum dimensions.   07/01/2014 Initial Biopsy   Left breast core needle bx: Invasive mammary carcinoma with lobular features, ER+ (100%), PR+ (91%),  HER2/neu negative (ratio 1.09), Ki-67 32%. E-cadherin strongly positive, diagnostic for IDC    07/01/2014 Clinical Stage   Stage IIA: T2 N0   08/20/2014 Definitive Surgery   Left breast seed localized lumpectomy/SLNB: IDC, +LVI, DCIS, 1 LN removed and positive for metastatic carcinoma. Grade 2. HER2/neu repeated and negative (ratio 0.69-1.9).    08/20/2014 Pathologic Stage   Stage IIB: pT2 pN1a pMx   09/17/2014 Surgery   Port-a cath placement and re-excision   10/02/2014 - 01/15/2015 Chemotherapy   CMF x 6 cycles.  patient refused Taxane-containing chemotherapy.   03/07/2015 Procedure   Comp Cancer Panel reveals no variant at APC, ATM, AXIN2, BARD1, BMPR1A, BRCA1, BRCA2, BRIP1, CDH1, CDK4, CDKN2A, CHEK2, FANCC, MLH1, MSH2, MSH6, MUTYH, NBN, PALB2, PMS2, POLD1, POLE, PTEN, RAD51C, RAD51D, SMAD4, STK11, TP53, VHL, and XRCC2.    04/06/2015 - 05/21/2015 Radiation Therapy   Adjuvant RT Pablo Ledger): Left breast/ 45 Gy over 25 fractions.   Left supraclavicular fossa and axilla/ 45 Gy over 25 fractions. Left breast boost/ 16 Gy over 8 fractions. Total dose: 61 Gy   06/04/2015 - 08/07/2015 Anti-estrogen oral therapy   Exemestane 25 mg daily.  Planned duration of therapy 5 years.    07/23/2015 Survivorship   Survivorship care plan completed and mailed to patient in lieu of in person visit   08/06/2015 Adverse Reaction   Severe hot flashes and mood swings   08/08/2015 - 12/07/2015 Anti-estrogen oral therapy   Arimidex   12/04/2015 Adverse Reaction   Worsening depression, patient stopped Arimidex   12/07/2015 -  Anti-estrogen oral therapy   Tamoxifen    She is currently on adjuvant tamoxifen.  She was recently seen for chief complaint of raspiness of her voice which then progressed to severe hoarseness.  She  became so uncomfortable that she could not talk.  She denied any problem with swallowing or breathing.  She had thyroid ultrasound which revealed a suspicious nodule in the mid right thyroid lobe  measuring 5 cm in greatest extent warranting further evaluation.  Subsequently the patient saw Dr. Redmond Baseman who performed a laryngoscopy which revealed a granular mass of right glottis suggestive of glottic cancer.  She then had CT neck which revealed a 9 mm nodule about right vocal cord consistent with history of glottic carcinoma, no enlarged lymph nodes.  There was however an enlarged right paratracheal lymph node which was thought to be suspicious for metastatic disease as well as the right thyroid nodule measuring 3.9 x 3.4 cm.  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  She then had imaging of the chest which revealed multiple enlarged mediastinal, bilateral hilar, left supraclavicular lymph nodes consistent with nodal metastatic disease, multiple small bilateral pulmonary nodules suspicious for pulmonary metastatic disease, additional nonspecific small nodules.  She then had PET/CT which demonstrated small right glottic lesion is mildly hypermetabolic and consistent with no neoplasm.  PET also demonstrated borderline right level 2 lymph node left supraclavicular, mediastinal and hilar lymphadenopathy and hypermetabolic left lower lobe pulmonary nodule all suspicious of metastatic focus.  No abdominal pelvic metastatic disease or osseous metastatic disease was appreciated.  Cytology from thyroid biopsy showed a benign follicular nodule Bethesda category 2 She is scheduled for biopsy of lung on 08/23/2021  She saw my colleague Dr. Isidore Moos and recommendation was to consider radiation for the glottic cancer and to also proceed with bronchoscopy to look at the etiology of the nodes in her chest.  Once we have this pathology available, I am happy to discuss therapy recommendations.  In the past she had breast cancer which was grade 2 ER 100%, PR 91% and HER2 negative KI of 32%  She is a heavy smoker, smoked 2 PPD , started at the age of 89. She denies any change in  breathing, cough, hemoptysis. She said she had a hip replacement in Aug and post surgery, she started noticing some hoarseness but she wondered if this was from intubation, but when her hoarseness continued to worsen, she  seeked attention. She took Tamoxifen for almost 6 yrs, stopped it before hip replacement in Aug 2022.  Interval history  Since her last visit, she has started radiation for her vocal cord cancer and has been tolerating it well except for some sore throat.  Her voice has significantly improved since she started radiation.  She also had bronchoscopy and biopsies of the mediastinal lesion which demonstrated squamous cell carcinoma.  I have already requested p16 staining.  She denies any chest pain or shortness of breath, has been spitting up some old blood since the procedure.  She is very very anxious to know her options of treatment.  Rest of the pertinent 10 point ROS reviewed and negative.  MEDICAL HISTORY:  Past Medical History:  Diagnosis Date   Arthritis    Asthma    Asymptomatic varicose veins    Breast cancer ([place]) 06/2014   left   Cancer ([employer])    Supra Glottis   Chronic airway obstruction (HCC)    Complication of anesthesia 2016   difficulty waking up, Oxygen dropped low.   COPD (chronic obstructive pulmonary disease) (HCC)    Depression    GERD (gastroesophageal reflux disease)    Hemorrhage rectum    and anus.  Hypertension    Insomnia    Other symptoms involving digestive system(787.99)    Pain    Hx.pain in joint involving pelvic region and thigh; pain in limb   Palpitations    Panic attacks    Personal history of chemotherapy 2016   Personal history of radiation therapy 2016   Pneumonia    Pre-diabetes    Sinusitis    Symptomatic menopausal or female climacteric states    Varicose veins of both lower extremities with pain     SURGICAL HISTORY: Past Surgical History:  Procedure Laterality Date   BREAST BIOPSY Left 10/2012   BREAST BIOPSY  Left 07/01/2014   malignant   BREAST LUMPECTOMY Left 08/20/2014   BRONCHIAL BIOPSY  08/23/2021   Procedure: BRONCHIAL BIOPSIES;  Surgeon: Collene Gobble, MD;  Location: [facility] END[facility]COPY;  Service: Pulmonary;;   BRONCHIAL BRUSHINGS  08/23/2021   Procedure: BRONCHIAL BRUSHINGS;  Surgeon: Collene Gobble, MD;  Location: [facility] ENDOSCOPY;  Service: Pulmonary;;   BRONCHIAL NEEDLE ASPIRATION BIOPSY  08/23/2021   Procedure: BRONCHIAL NEEDLE ASPIRATION BIOPSIES;  Surgeon: Collene Gobble, MD;  Location: [facility] ENDOSCOPY;  Service: Pulmonary;;   BRONCHIAL WASHINGS  08/23/2021   Procedure: BRONCHIAL WASHINGS;  Surgeon: Collene Gobble, MD;  Location: [facility] ENDOSCOPY;  Service: Pulmonary;;   CESAREAN SECTION     CHOLECYSTECTOMY  1985   COLONOSCOPY  2002   Dr. Lindalou Hose: internal hemorrhoids, one small rectal polyp, adenomatous path    COLONOSCOPY N/A 11/23/2015   Procedure: COLONOSCOPY;  Surgeon: Danie Binder, MD;  Location: [facility] SU[facility];  Service: Endoscopy;  Laterality: N/A;  1100 - moved to 10:45 - office to notify   ESOPHAGOGASTRODUODENOSCOPY N/A 11/23/2015   Procedure: ESOPHAGOGASTRODUODENOSCOPY (EGD);  Surgeon: Danie Binder, MD;  Location: [facility];  Service: Endoscopy;  Laterality: N/A;   PORT-A-CATH REMOVAL  02/2015   PORTACATH PLACEMENT Right 09/17/2014   Procedure: INSERTION PORT-A-CATH WITH ULTRASOUND;  Surgeon: Erroll Luna, MD;  Location: [facility];  Service: General;  Laterality: Right;  IJ   RADIOACTIVE SEED GUIDED PARTIAL MASTECTOMY WITH AXILLARY SENTINEL LYMPH NODE BIOPSY Left 08/20/2014   Procedure: LEFT BREAST LUMPECTOMY WITH RADIOACTIVE SEED LOCALIZATION AND SENTINEL LYMPH NODE MAPPING;  Surgeon: Erroll Luna, MD;  Location: [facility];  Service: General;  Laterality: Left;   RE-EXCISION OF BREAST LUMPECTOMY Left 09/17/2014   Procedure: RE-EXCISION OF BREAST LUMPECTOMY;  Surgeon: Erroll Luna, MD;  Location: [facility];  Service: General;   Laterality: Left;   TOTAL HIP ARTHROPLASTY Right 02/22/2021   Procedure: RIGHT TOTAL HIP ARTHROPLASTY ANTERIOR APPROACH;  Surgeon: Marybelle Killings, MD;  Location: [facility];  Service: Orthopedics;  Laterality: Right;   TUBAL LIGATION     VIDEO BRONCHOSCOPY WITH ENDOBRONCHIAL ULTRASOUND N/A 08/23/2021   Procedure: VIDEO BRONCHOSCOPY WITH ENDOBRONCHIAL ULTRASOUND;  Surgeon: Collene Gobble, MD;  Location: [facility] ENDOSCOPY;  Service: Pulmonary;  Laterality: N/A;   VIDEO BRONCHOSCOPY WITH RADIAL ENDOBRONCHIAL ULTRASOUND  08/23/2021   Procedure: VIDEO BRONCHOSCOPY WITH RADIAL ENDOBRONCHIAL ULTRASOUND;  Surgeon: Collene Gobble, MD;  Location: [facility];  Service: Pulmonary;;    SOCIAL HISTORY: Social History   Socioeconomic History   Marital status: Divorced    Spouse name: Not on file   Number of children: 2   Years of education: Not on file   Highest education level: Not on file  Occupational History   Not on file  Tobacco Use   Smoking status: Former    Packs/day:  1.00    Years: 42.00    Pack years: 42.00    Types: Cigarettes    Quit date: 02/22/2021    Years since quitting: 0.5   Smokeless tobacco: Never  Vaping Use   Vaping Use: Never used  Substance and Sexual Activity   Alcohol use: No    Alcohol/week: 0.0 standard drinks   Drug use: No   Sexual activity: Never    Birth control/protection: None  Other Topics Concern   Not on file  Social History Narrative   Not on file   Social Determinants of Health   Financial Resource Strain: Not on file  Food Insecurity: Not on file  Transportation Needs: Not on file  Physical Activity: Not on file  Stress: Not on file  Social Connections: Unknown   Frequency of Communication with Friends and Family: More than three times a week   Frequency of Social Gatherings with Friends and Family: More than three times a week   Attends Religious Services: Not on Electrical engineer or Organizations: Not on file   Attends Theatre manager Meetings: Not on file   Marital Status: Not on file  Intimate Partner Violence: Not on file    FAMILY HISTORY: Family History  Problem Relation Age of Onset   Diabetes Mother    Ovarian cancer Mother 80   Other Father        died in MVA at age 71   Other Sister        mitral valve disorder   Melanoma Sister 32       removed from back of leg   Colon cancer Brother        early 8s, succumbed to disease   Cancer Paternal Aunt        leukemia, bone, or lung cancer   Alzheimer's disease Maternal Grandmother    Heart attack Maternal Grandfather    Heart attack Paternal Grandmother    COPD Paternal Grandfather    Emphysema Paternal Grandfather    Congestive Heart Failure Paternal Aunt    Stomach cancer Paternal Uncle    Kidney cancer Maternal Uncle    Cancer Cousin        dx. teens/dx. 10s   Cancer Cousin    Colon cancer Cousin     ALLERGIES:  has No Known Allergies.  MEDICATIONS:  Current Outpatient Medications  Medication Sig Dispense Refill   albuterol (PROVENTIL HFA;VENTOLIN HFA) 108 (90 BASE) MCG/ACT inhaler Inhale 2 puffs into the lungs every 6 (six) hours as needed for wheezing or shortness of breath.      ALPRAZolam (XANAX) 0.5 MG tablet Take 0.5 mg by mouth 3 (three) times daily.     ANORO ELLIPTA 62.5-25 MCG/INH AEPB Inhale 1 puff into the lungs daily.     aspirin EC 325 MG EC tablet Take 1 tablet (325 mg total) by mouth daily with breakfast. Must take at least 4 weeks postop for DVT prophylaxis (Patient not taking: Reported on 04/15/2021) 30 tablet 0   calcium carbonate (TUMS - DOSED IN MG ELEMENTAL CALCIUM) 500 MG chewable tablet Chew 2 tablets by mouth 3 (three) times daily as needed for indigestion or heartburn.     diphenhydramine-acetaminophen (TYLENOL PM) 25-500 MG TABS tablet Take 2 tablets by mouth at bedtime as needed (sleep).     fluconazole (DIFLUCAN) 100 MG tablet Take 2 tablets today, then 1 tablet daily x 20 more days. 22 tablet 0    furosemide (LASIX)  20 MG tablet Take 20 mg by mouth daily.     hydrochlorothiazide (HYDRODIURIL) 12.5 MG tablet Take 12.5 mg by mouth daily.     ibuprofen (ADVIL) 800 MG tablet Take 800 mg by mouth every 8 (eight) hours as needed for moderate pain.     lidocaine (XYLOCAINE) 2 % solution Patient: Mix 1part 2% viscous lidocaine, 1part H20. Swallow 83m of diluted mixture, 38m before meals and at bedtime, up to QID for sore throat. 200 mL 3   Menthol, Topical Analgesic, (BIOFREEZE EX) Apply 1 application topically daily as needed (pain).     pantoprazole (PROTONIX) 40 MG tablet Take 1 tablet by mouth once daily 90 tablet 0   potassium chloride (KLOR-CON M) 10 MEQ tablet Take 10 mEq by mouth daily. Patient stated stated that she was started on 20 meq daily in August.     sertraline (ZOLOFT) 100 MG tablet Take 100 mg by mouth daily.     No current facility-administered medications for this visit.    PHYSICAL EXAMINATION: ECOG PERFORMANCE STATUS: 0 - Asymptomatic  Vitals:   08/30/21 1334  BP: (!) 141/66  Pulse: 75  Resp: 18  Temp: (!) 97.4 F (36.3 C)  SpO2: 93%    Filed Weights   08/30/21 1334  Weight: 217 lb (98.4 kg)   PE deferred in lieu of counseling  LABORATORY DATA:  I have reviewed the data as listed Lab Results  Component Value Date   WBC 6.5 08/10/2021   HGB 14.0 08/10/2021   HCT 42.4 08/10/2021   MCV 88.9 08/10/2021   PLT 241 08/10/2021   Lab Results  Component Value Date   NA 139 08/10/2021   K 4.1 08/10/2021   CL 102 08/10/2021   CO2 31 08/10/2021    RADIOGRAPHIC STUDIES: I have personally reviewed the radiological reports and agreed with the findings in the report.  ASSESSMENT AND PLAN:  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[facility]This is a very pleasant [age]ear old female patient, heavy smoker 2 packs/day who is now newly diagnosed with right glottic squamous cell carcinoma, p16 positive. She had imaging to confirm staging and this has revealed  borderline right level 2 lymph node, left supraclavicular, mediastinal and hilar adenopathy and hypermetabolic left lower lobe pulmonary nodule all suspicious of metastatic focus.  Pathology from the lung lesions is positive for squamous cell carcinoma, p16 staining pending.  I do not believe this is related to her primary glottic squamous cell carcinoma. She is undergoing definitive radiation for management of her squamous cell carcinoma and has been responding well, voice has significantly improved, she has some sore throat for which she will use viscous lidocaine.  No other concerns.  Squamous cell carcinoma of lung (HCC) Cytology from left lower lobe lung nodule consistent with squamous cell carcinoma, PD-L1 pending.  Given supraclavicular lymphadenopathy, I believe this is stage IIIB lung cancer. Unfortunately she may not be a candidate for concurrent chemoradiation per my discussion with Dr. SqIsidore Moos Dr. SqIsidore Moosas recommended trying sequential chemoradiation especially since her ongoing radiation for the vocal cord squamous cell carcinoma.  Once she is done with her definitive treatment for the vocal cord carcinoma, we will proceed with chemotherapy likely CarboTaxol every 3 weeks for 3-4 cycles followed by radiation.  She will be a candidate for adjuvant immunotherapy as well.  We have discussed about mechanism of action of chemotherapy, adverse effects of chemotherapy including but not limited to fatigue, nausea, vomiting, diarrhea, increased risk of infections, neuropathy etc.  She  understands that some of the side effects from chemotherapy can be permanent and fatal.  She will return to clinic in about 4 weeks which will be about 2 weeks after she completes radiation to allow adequate recovery.  She understands that the tumor could grow in the interim but unfortunately given multiple concurrent cancers, we have to time it this way.  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  No significant role for antiestrogen therapy since she has completed 6 years.  Total time spent: 45 minutes  All questions were answered. The patient knows to call the clinic with any problems, questions or concerns.    Benay Pike, MD 08/31/21

## 2021-08-30 NOTE — Addendum Note (Signed)
Addended by: Pincus Large on: 08/30/2021 03:50 PM   Modules accepted: Orders

## 2021-08-31 ENCOUNTER — Ambulatory Visit
Admission: RE | Admit: 2021-08-31 | Discharge: 2021-08-31 | Disposition: A | Discharge status: Home / Self Care | Payer: Medicare HMO | Source: Ambulatory Visit | Attending: Radiation Oncology | Admitting: Radiation Oncology

## 2021-08-31 ENCOUNTER — Encounter: Payer: Self-pay | Admitting: Hematology and Oncology

## 2021-08-31 ENCOUNTER — Encounter (HOSPITAL_COMMUNITY): Payer: Self-pay | Admitting: Hematology & Oncology

## 2021-08-31 ENCOUNTER — Ambulatory Visit: Payer: Medicare HMO

## 2021-08-31 ENCOUNTER — Encounter: Payer: Medicare HMO | Admitting: Dietician

## 2021-08-31 DIAGNOSIS — C3490 Malignant neoplasm of unspecified part of unspecified bronchus or lung: Secondary | ICD-10-CM | POA: Insufficient documentation

## 2021-08-31 DIAGNOSIS — C321 Malignant neoplasm of supraglottis: Secondary | ICD-10-CM | POA: Diagnosis not present

## 2021-08-31 DIAGNOSIS — C320 Malignant neoplasm of glottis: Secondary | ICD-10-CM | POA: Diagnosis not present

## 2021-08-31 DIAGNOSIS — Z510 Encounter for antineoplastic radiation therapy: Secondary | ICD-10-CM | POA: Diagnosis not present

## 2021-08-31 NOTE — Assessment & Plan Note (Addendum)
This is a very pleasant 71 year old female patient, heavy smoker 2 packs/day who is now newly diagnosed with right glottic squamous cell carcinoma, p16 positive. She had imaging to confirm staging and this has revealed borderline right level 2 lymph node, left supraclavicular, mediastinal and hilar adenopathy and hypermetabolic left lower lobe pulmonary nodule all suspicious of metastatic focus.  Pathology from the lung lesions is positive for squamous cell carcinoma, p16 staining pending.  I do not believe this is related to her primary glottic squamous cell carcinoma. She is undergoing definitive radiation for management of her squamous cell carcinoma and has been responding well, voice has significantly improved, she has some sore throat for which she will use viscous lidocaine.  No other concerns.

## 2021-08-31 NOTE — Assessment & Plan Note (Signed)
No concern for recurrence of breast cancer at this time.  No significant role for antiestrogen therapy since she has completed 6 years.

## 2021-08-31 NOTE — Assessment & Plan Note (Signed)
Cytology from left lower lobe lung nodule consistent with squamous cell carcinoma, PD-L1 pending.  Given supraclavicular lymphadenopathy, I believe this is stage IIIB lung cancer. Unfortunately she may not be a candidate for concurrent chemoradiation per my discussion with Dr. Isidore Moos.  Dr. Isidore Moos has recommended trying sequential chemoradiation especially since her ongoing radiation for the vocal cord squamous cell carcinoma.  Once she is done with her definitive treatment for the vocal cord carcinoma, we will proceed with chemotherapy likely CarboTaxol every 3 weeks for 3-4 cycles followed by radiation.  She will be a candidate for adjuvant immunotherapy as well.  We have discussed about mechanism of action of chemotherapy, adverse effects of chemotherapy including but not limited to fatigue, nausea, vomiting, diarrhea, increased risk of infections, neuropathy etc.  She understands that some of the side effects from chemotherapy can be permanent and fatal.  She will return to clinic in about 4 weeks which will be about 2 weeks after she completes radiation to allow adequate recovery.  She understands that the tumor could grow in the interim but unfortunately given multiple concurrent cancers, we have to time it this way.

## 2021-09-01 ENCOUNTER — Other Ambulatory Visit: Payer: Self-pay

## 2021-09-01 ENCOUNTER — Ambulatory Visit
Admission: RE | Admit: 2021-09-01 | Discharge: 2021-09-01 | Disposition: A | Discharge status: Home / Self Care | Payer: Medicare HMO | Source: Ambulatory Visit | Attending: Radiation Oncology | Admitting: Radiation Oncology

## 2021-09-01 DIAGNOSIS — Z510 Encounter for antineoplastic radiation therapy: Secondary | ICD-10-CM | POA: Diagnosis not present

## 2021-09-01 DIAGNOSIS — C320 Malignant neoplasm of glottis: Secondary | ICD-10-CM | POA: Diagnosis not present

## 2021-09-01 DIAGNOSIS — C321 Malignant neoplasm of supraglottis: Secondary | ICD-10-CM | POA: Diagnosis not present

## 2021-09-02 ENCOUNTER — Inpatient Hospital Stay: Payer: Medicare HMO | Admitting: Dietician

## 2021-09-02 ENCOUNTER — Ambulatory Visit
Admission: RE | Admit: 2021-09-02 | Discharge: 2021-09-02 | Disposition: A | Discharge status: Home / Self Care | Payer: Medicare HMO | Source: Ambulatory Visit | Attending: Radiation Oncology | Admitting: Radiation Oncology

## 2021-09-02 DIAGNOSIS — C321 Malignant neoplasm of supraglottis: Secondary | ICD-10-CM | POA: Diagnosis not present

## 2021-09-02 DIAGNOSIS — Z510 Encounter for antineoplastic radiation therapy: Secondary | ICD-10-CM | POA: Diagnosis not present

## 2021-09-02 DIAGNOSIS — C320 Malignant neoplasm of glottis: Secondary | ICD-10-CM | POA: Diagnosis not present

## 2021-09-02 NOTE — Progress Notes (Signed)
Nutrition Follow-up:  Patient with glottis cancer. She is currently receiving definitive  radiation therapy.   She is s/p bronchoscopy and biopsies of mediastinal lesion on 1/30. This demonstrated SCC of lung. Plans to proceed with chemotherapy followed by radiation and adjuvant immunotherapy in ~4 weeks. Patient is under the care of Dr. Chryl Heck and Dr. Isidore Moos.  Met with patient after radiation treatment. She reports significant improvement in her voice. Patient has sore throat and dry mouth. Patient is drinking ~1 bottle of water/day. Patient likes to drink SunDrop. This burns her throat, but she takes a sip of water to help this. Patient is doing baking soda salt water rinses several times day. She reports her appetite is good. Patient eating variety of soft foods (chicken/dumplings, potato soup, chocolate pudding, jello, ice cream). Patient did not like the taste of Ensure. She has not tried CIB sample yet. Patient reports mild constipation. She is going to start taking stool softener.    Medications: reviewed  Labs: no new labs for review   Anthropometrics: Weight 216.4 lb today decreased 1.5% (3.4 lbs) in 7 days. Patient weighed 219.8 lb on 2/1   NUTRITION DIAGNOSIS: Unintended weight loss ongoing    INTERVENTION:  Reinforced importance of weight maintenance  Encouraged soft moist high protein foods Encouraged pt to try CIB powder mixed with milk Suggested adding Ensure to ice cream for high calorie shake - shake recipes provided Discussed strategies for constipation - handout provided Patient will work to increase water intake Continue baking soda salt water rinses  Contact information provided     MONITORING, EVALUATION, GOAL: weight trends, intake    NEXT VISIT: Thursday February 16 after radiation therapy

## 2021-09-03 ENCOUNTER — Ambulatory Visit (HOSPITAL_COMMUNITY)
Admission: RE | Admit: 2021-09-03 | Discharge: 2021-09-03 | Disposition: A | Discharge status: Home / Self Care | Payer: Medicare HMO | Source: Ambulatory Visit | Attending: Radiation Oncology | Admitting: Radiation Oncology

## 2021-09-03 ENCOUNTER — Other Ambulatory Visit: Payer: Self-pay

## 2021-09-03 ENCOUNTER — Ambulatory Visit
Admission: RE | Admit: 2021-09-03 | Discharge: 2021-09-03 | Disposition: A | Discharge status: Home / Self Care | Payer: Medicare HMO | Source: Ambulatory Visit | Attending: Radiation Oncology | Admitting: Radiation Oncology

## 2021-09-03 ENCOUNTER — Encounter: Payer: Self-pay | Admitting: General Practice

## 2021-09-03 DIAGNOSIS — C320 Malignant neoplasm of glottis: Secondary | ICD-10-CM | POA: Diagnosis not present

## 2021-09-03 DIAGNOSIS — C321 Malignant neoplasm of supraglottis: Secondary | ICD-10-CM | POA: Diagnosis not present

## 2021-09-03 DIAGNOSIS — C3490 Malignant neoplasm of unspecified part of unspecified bronchus or lung: Secondary | ICD-10-CM | POA: Diagnosis not present

## 2021-09-03 DIAGNOSIS — Z510 Encounter for antineoplastic radiation therapy: Secondary | ICD-10-CM | POA: Diagnosis not present

## 2021-09-03 DIAGNOSIS — G9389 Other specified disorders of brain: Secondary | ICD-10-CM | POA: Diagnosis not present

## 2021-09-03 IMAGING — MR MR HEAD WO/W CM
9 of 14 series · 18 of 48 positions shown · IV contrast (gadavist)
Comparison: PET-CT [DATE].

CLINICAL DATA: 70-year-old female with history of glottic
carcinoma, breast cancer, non-small cell lung cancer. Staging.

EXAM:
MRI HEAD WITHOUT AND WITH CONTRAST
TECHNIQUE: Multiplanar, multiecho pulse sequences of the brain and surrounding
structures were obtained without and with intravenous contrast.
CONTRAST:  10mL GADAVIST GADOBUTROL 1 MMOL/ML IV SOLN

[Series 2: FLAIR · sagittal · 3.0mm · 0.47mm/px · 1 of 37 slices shown (1 of 2)]
[im 1/37]
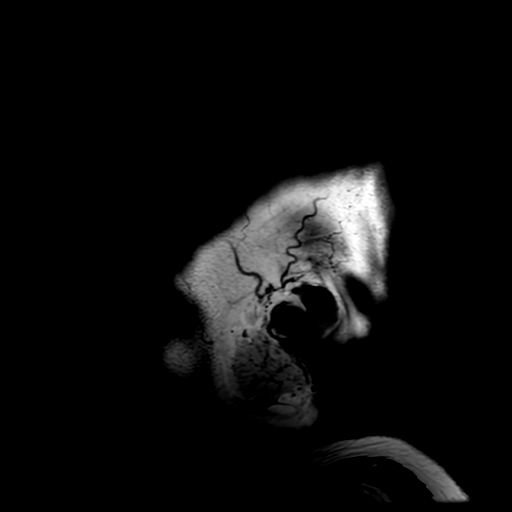

[Series 3: DWI · axial · 3.0mm · 0.94mm/px · z∈[-24,+117]mm · 3 of 98 slices shown]
[im 1/98]
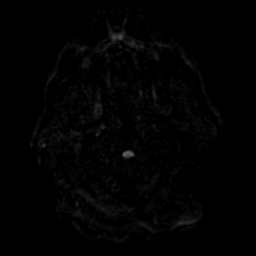
[im 49/98]
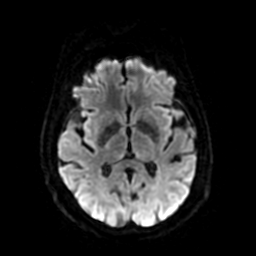
[im 98/98]
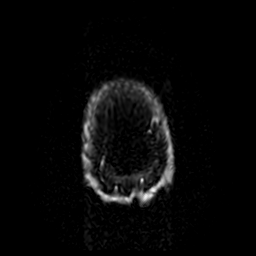

[Series 4: FLAIR · axial · 3.0mm · 0.47mm/px · 1 of 55 slices shown (2 of 2)]
[im 1/55]
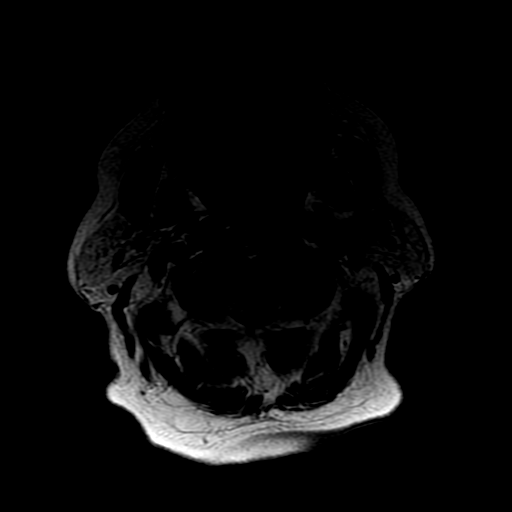

[Series 7: T2 post-contrast · coronal · 3.0mm · 0.39mm/px · 1 of 43 slices shown]
[im 1/43]
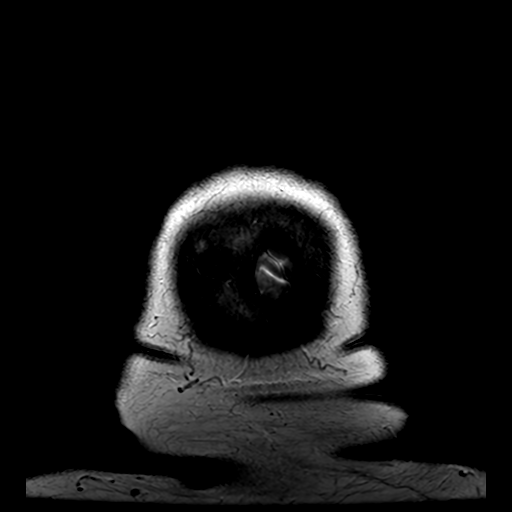

[Series 8: T2 · axial · 5.0mm · 0.23mm/px · 1 of 24 slices shown]
[im 1/24]
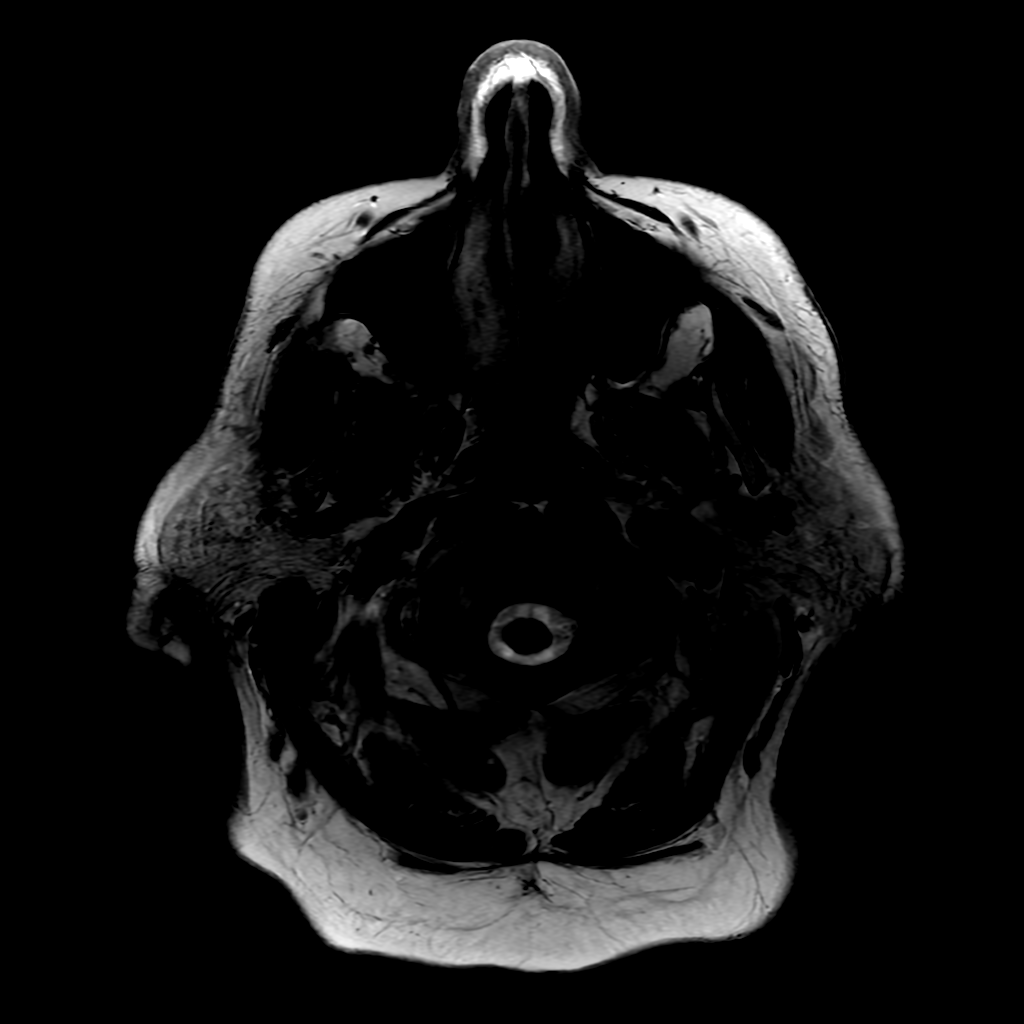

[Series 9: T1 post-contrast · coronal · 3.0mm · 0.43mm/px · 1 of 44 slices shown (1 of 2)]
[im 1/44]
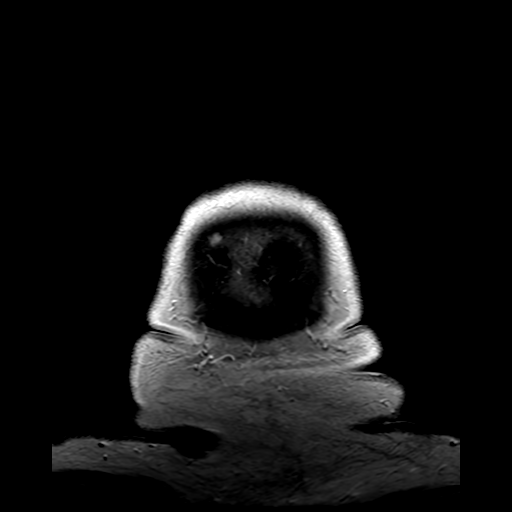

[Series 10: FLAIR post-contrast · sagittal · 3.0mm · 0.47mm/px · 1 of 37 slices shown]
[im 1/37]
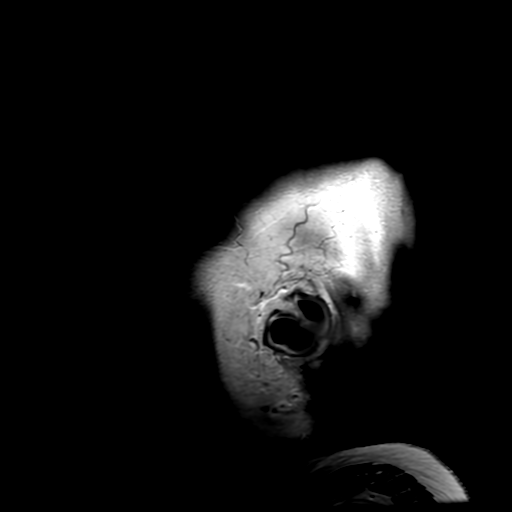

[Series 350: ADC · axial · 3.0mm · 0.94mm/px · 1 of 49 slices shown]
[im 1/49]
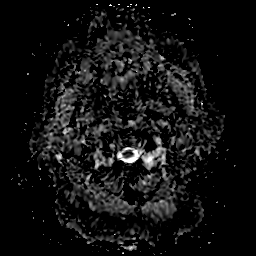

[Series 1100: T1 post-contrast · axial · 0.9mm · 0.50mm/px · z∈[-128,+127]mm · 8 of 285 slices shown (2 of 2)]
[im 1/285]
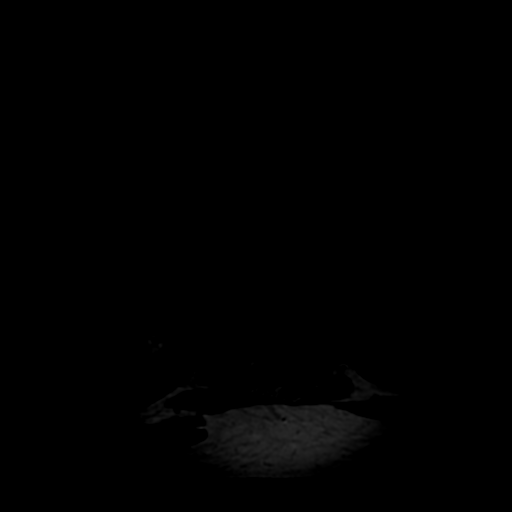
[im 41/285]
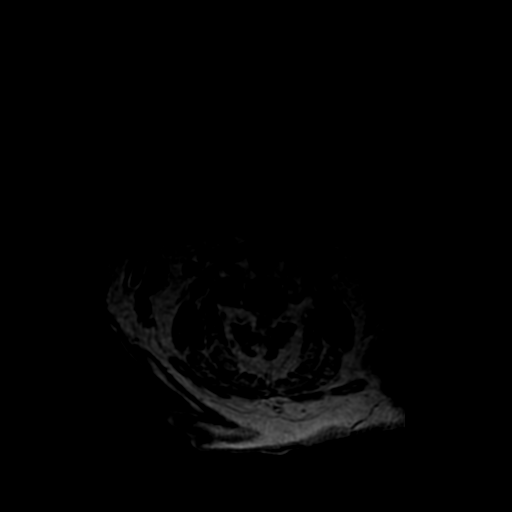
[im 82/285]
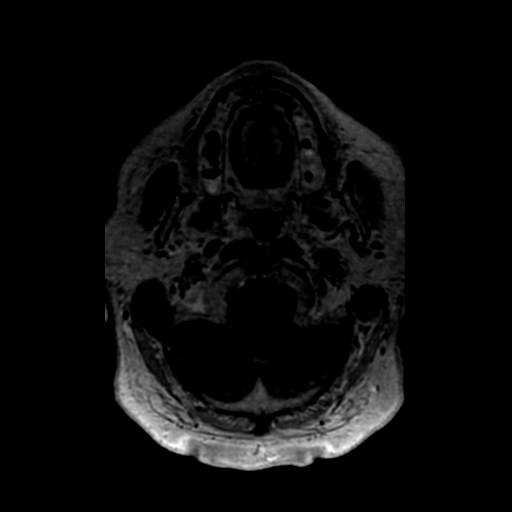
[im 122/285]
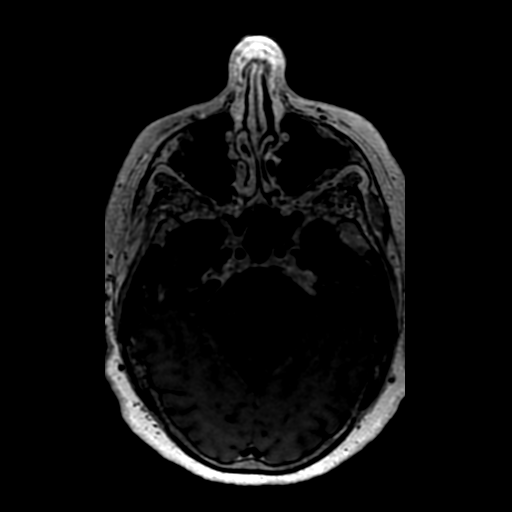
[im 163/285]
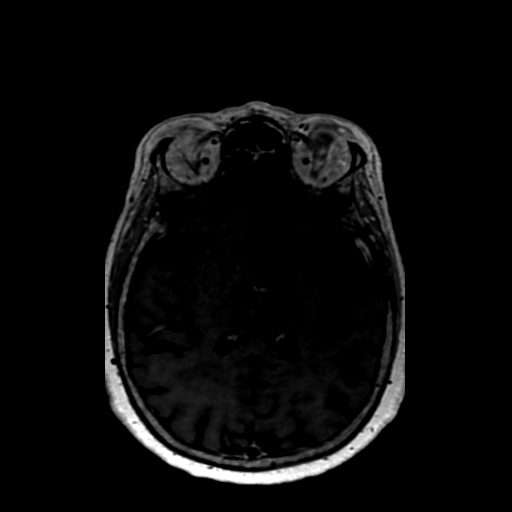
[im 203/285]
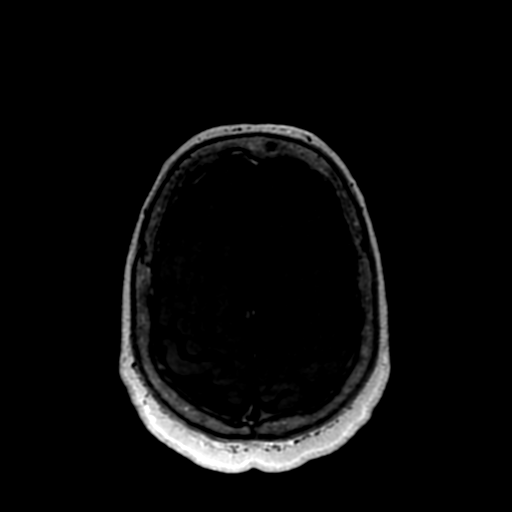
[im 244/285]
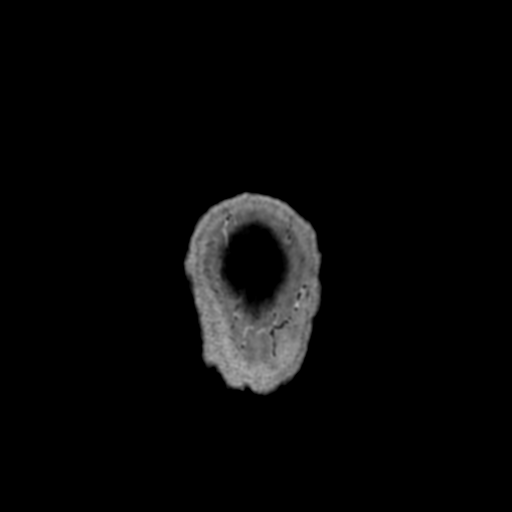
[im 285/285]
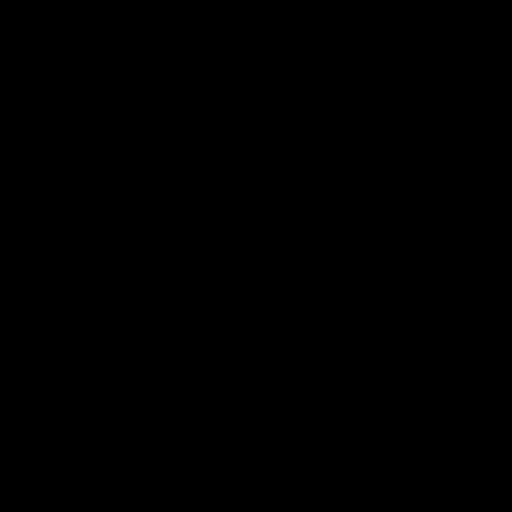

[18 of 48 positions shown; findings below may reference images not displayed]

FINDINGS: Brain: Cerebral volume is within normal limits for age. No
restricted diffusion to suggest acute infarction. No midline shift,
mass effect, evidence of mass lesion, ventriculomegaly, extra-axial
collection or acute intracranial hemorrhage. Cervicomedullary
junction and pituitary are within normal limits.

No abnormal enhancement identified. No pachymeningeal thickening
identified. A subtle area of prominent leptomeninges along the
medial right frontal lobe, right cingulate gyrus (series [7X], image
219 and series 9, image 25) is not associated with cerebral edema or
signal abnormality on the remaining sequences and felt to be a
normal variant.

Patchy and confluent bilateral cerebral white matter T2 and FLAIR
hyperintensity is moderately advanced for age, in a nonspecific
configuration. No cortical encephalomalacia identified. No chronic
cerebral blood products. Deep gray matter nuclei and cerebellum are
within normal limits. Mild patchy T2 and FLAIR hyperintensity in the
pons.

Vascular: Major intracranial vascular flow voids are preserved.
There is mild generalized intracranial artery tortuosity. The distal
left vertebral artery appears to be dominant (normal variant).

Skull and upper cervical spine: Negative visible cervical spine.
Background bone marrow signal is mildly heterogeneous but generally
normal. There is a subtle 12 mm area of decreased T1 marrow
intensity in the left calvarium vertex series 2, image 28), which is
subtle on the remaining sequences but indeterminate, with slightly
conspicuous diffusion signal (series 3, image 44). No other
suspicious bone lesion identified. Small volume degenerative right
TMJ joint effusion.

Sinuses/Orbits: Negative orbits. No significant paranasal sinus
mucosal thickening, no sinus fluid levels.

Other: Visible internal auditory structures appear normal. Negative
visible scalp and face.
IMPRESSION: 1. No cerebral metastatic disease identified.
2. Indeterminate 12 mm left calvarium bone lesion. Small skull
metastasis not excluded. Attention directed on follow-up.
3. No acute intracranial abnormality. Moderate for age signal
changes in the brain, most commonly due to chronic small vessel
disease.

## 2021-09-03 MED ORDER — GADOBUTROL 1 MMOL/ML IV SOLN
10.0000 mL | Freq: Once | INTRAVENOUS | Status: AC | PRN
  Administered 2021-09-03: 10 mL via INTRAVENOUS

## 2021-09-03 NOTE — Progress Notes (Signed)
[facility] Spiritual Care Note  Referred by Head and Neck Nagivator Anderson Malta Malmfelt/RN for distress related to current treatment and discovery of new lung primary. Reached [name], who is happy to be called "Regina Mcmahon," by phone to introduce Spiritual Care as part of her support team. We plan to meet in my office on Tuesday 2/14 at 1:00, directly before her radiation appointment, to speak in more detail.   [place], [place], [facility] Pager [phone] Voicemail [phone]

## 2021-09-06 ENCOUNTER — Ambulatory Visit
Admission: RE | Admit: 2021-09-06 | Discharge: 2021-09-06 | Disposition: A | Discharge status: Home / Self Care | Payer: Medicare HMO | Source: Ambulatory Visit | Attending: Radiation Oncology | Admitting: Radiation Oncology

## 2021-09-06 ENCOUNTER — Other Ambulatory Visit: Payer: Self-pay

## 2021-09-06 ENCOUNTER — Other Ambulatory Visit: Payer: Self-pay | Admitting: Radiation Oncology

## 2021-09-06 ENCOUNTER — Other Ambulatory Visit: Payer: Medicare HMO

## 2021-09-06 DIAGNOSIS — C321 Malignant neoplasm of supraglottis: Secondary | ICD-10-CM | POA: Diagnosis not present

## 2021-09-06 DIAGNOSIS — Z510 Encounter for antineoplastic radiation therapy: Secondary | ICD-10-CM | POA: Diagnosis not present

## 2021-09-06 DIAGNOSIS — C320 Malignant neoplasm of glottis: Secondary | ICD-10-CM | POA: Diagnosis not present

## 2021-09-06 MED ORDER — HYDROCODONE-ACETAMINOPHEN 7.5-325 MG/15ML PO SOLN: 10.0000 mL | ORAL | 0 refills | Status: DC | PRN

## 2021-09-07 ENCOUNTER — Ambulatory Visit
Admission: RE | Admit: 2021-09-07 | Discharge: 2021-09-07 | Disposition: A | Discharge status: Home / Self Care | Payer: Medicare HMO | Source: Ambulatory Visit | Attending: Radiation Oncology | Admitting: Radiation Oncology

## 2021-09-07 ENCOUNTER — Other Ambulatory Visit: Payer: Self-pay

## 2021-09-07 ENCOUNTER — Encounter: Payer: Self-pay | Admitting: General Practice

## 2021-09-07 ENCOUNTER — Inpatient Hospital Stay: Payer: Medicare HMO | Admitting: General Practice

## 2021-09-07 ENCOUNTER — Encounter: Payer: Medicare HMO | Admitting: Nutrition

## 2021-09-07 DIAGNOSIS — Z510 Encounter for antineoplastic radiation therapy: Secondary | ICD-10-CM | POA: Diagnosis not present

## 2021-09-07 DIAGNOSIS — C321 Malignant neoplasm of supraglottis: Secondary | ICD-10-CM | POA: Diagnosis not present

## 2021-09-07 DIAGNOSIS — C320 Malignant neoplasm of glottis: Secondary | ICD-10-CM | POA: Diagnosis not present

## 2021-09-07 NOTE — Progress Notes (Signed)
[facility] Spiritual Care Note  Canceled today's Spiritual Care appointment per Ms [facility], [facility], [facility] request via Head and Neck Navigator [facility] Malmfelt/RN. Plan to follow up by phone in a few days for pastoral check-in.   [place], [place], [facility] Pager [phone] Voicemail [phone]

## 2021-09-08 ENCOUNTER — Ambulatory Visit
Admission: RE | Admit: 2021-09-08 | Discharge: 2021-09-08 | Disposition: A | Discharge status: Home / Self Care | Payer: Medicare HMO | Source: Ambulatory Visit | Attending: Radiation Oncology | Admitting: Radiation Oncology

## 2021-09-08 DIAGNOSIS — C321 Malignant neoplasm of supraglottis: Secondary | ICD-10-CM | POA: Diagnosis not present

## 2021-09-08 DIAGNOSIS — C320 Malignant neoplasm of glottis: Secondary | ICD-10-CM | POA: Diagnosis not present

## 2021-09-08 DIAGNOSIS — Z510 Encounter for antineoplastic radiation therapy: Secondary | ICD-10-CM | POA: Diagnosis not present

## 2021-09-09 ENCOUNTER — Other Ambulatory Visit: Payer: Self-pay

## 2021-09-09 ENCOUNTER — Inpatient Hospital Stay: Payer: Medicare HMO | Admitting: Nutrition

## 2021-09-09 ENCOUNTER — Ambulatory Visit: Payer: Medicare HMO

## 2021-09-09 ENCOUNTER — Encounter: Payer: Self-pay | Admitting: Physical Therapy

## 2021-09-09 ENCOUNTER — Encounter: Payer: Self-pay | Admitting: General Practice

## 2021-09-09 ENCOUNTER — Ambulatory Visit: Payer: Medicare HMO | Admitting: Physical Therapy

## 2021-09-09 ENCOUNTER — Ambulatory Visit
Admission: RE | Admit: 2021-09-09 | Discharge: 2021-09-09 | Disposition: A | Discharge status: Home / Self Care | Payer: Medicare HMO | Source: Ambulatory Visit | Attending: Radiation Oncology | Admitting: Radiation Oncology

## 2021-09-09 DIAGNOSIS — C321 Malignant neoplasm of supraglottis: Secondary | ICD-10-CM | POA: Diagnosis not present

## 2021-09-09 DIAGNOSIS — Z510 Encounter for antineoplastic radiation therapy: Secondary | ICD-10-CM | POA: Diagnosis not present

## 2021-09-09 DIAGNOSIS — C320 Malignant neoplasm of glottis: Secondary | ICD-10-CM | POA: Diagnosis not present

## 2021-09-09 DIAGNOSIS — R293 Abnormal posture: Secondary | ICD-10-CM | POA: Diagnosis not present

## 2021-09-09 DIAGNOSIS — R1310 Dysphagia, unspecified: Secondary | ICD-10-CM | POA: Diagnosis not present

## 2021-09-09 DIAGNOSIS — C3490 Malignant neoplasm of unspecified part of unspecified bronchus or lung: Secondary | ICD-10-CM | POA: Diagnosis not present

## 2021-09-09 NOTE — Patient Instructions (Signed)
SWALLOWING EXERCISES Do these until 6 months after your last day of radiation, then 2-3 times per week afterwards  Effortful Swallows - Press your tongue against the roof of your mouth for 3 seconds, then squeeze the muscles in your neck while you swallow your saliva or a sip of water - Repeat 10-15 times, 2-3 times a day, and use whenever you eat or drink  Pitch Raise - Repeat he, once per second in as high of a pitch as you can - Repeat 20 times, 2-3 times a day  [employer] - half swallow exercise - Start to swallow, and keep your Adams apple up by squeezing hard with the muscles of the throat - Hold the squeeze for 5-7 seconds and then relax - Repeat 10-15 times, 2-3 times a day *use a wet spoon if your mouth gets dry*        4. "Super Swallow"  - Take a breath and hold it  - Bear down (like pushing your bowels)  - Swallow then IMMEDIATELY cough  - Repeat 10 times, 2-3 times a day         5.  Siren exercise  - Say "eeeee" in as low of a pitch you can and glide to as high of a pitch as you can - then back as low as you can  - repeat 15 times, 2-3 times a day

## 2021-09-09 NOTE — Progress Notes (Signed)
Oncology Nurse Navigator Documentation   I met with Regina Mcmahon during head and neck MDC today. She continues to tolerate treatment well and has no concerns at this time. She knows to call me if she has any questions or concerns.  Harlow Asa RN, BSN, OCN Head & Neck Oncology Nurse [place] [facility] Phone # [phone]  Fax # [phone]

## 2021-09-09 NOTE — Progress Notes (Signed)
[facility] Spiritual Care Note  Following from a distance until it might feel like the right time for Ms Santillano to connect. Gave Head and Neck Navigator Jennifer Malmfelt/RN a pick-me-up package, including handmade healing blanket, to give to Ms Wanninger as a gesture of support and encouragement.   [place], [place], [facility] Pager [phone] Voicemail [phone]

## 2021-09-09 NOTE — Progress Notes (Signed)
Nutrition follow-up completed with patient during head and neck clinic.  Final radiation treatment is scheduled for February 20. Patient is receiving radiation therapy for glottis cancer. Weight is decreased and documented as 215.2 pounds on February 13.  This is down from 219.8 pounds on February 1. She denies nutrition impact symptoms such as nausea, vomiting, constipation, or diarrhea. She feels as if she is eating adequately.  Nutrition diagnosis: Unintended weight loss ongoing.  Intervention: Encouraged patient to continue soft, high-protein foods with added calories and protein. Continue oral nutrition supplements.  Offered coupons however patient declined today. Provided support and encouragement.  Monitoring, evaluation, goals:  Patient will increase calories and protein to minimize further weight loss.  Next visit: No follow-up is scheduled.  Patient will complete radiation therapy for glottis cancer Monday.  Encouraged her to contact RD with questions or concerns.  Will monitor for plan for treatment for SCC of the lung.  **Disclaimer: This note was dictated with voice recognition software. Similar sounding words can inadvertently be transcribed and this note may contain transcription errors which may not have been corrected upon publication of note.**

## 2021-09-09 NOTE — Therapy (Signed)
[facility] [address] [place], [place], [address] Phone: [phone]   Fax:  [phone]  Speech Language Pathology Evaluation  Patient Details  Name: Regina Mcmahon MRN: [id number] Date of Birth: 11-06-1950 Referring Provider (SLP): Eppie Gibson, MD   Encounter Date: 09/09/2021   End of Session - 09/09/21 1354     Visit Number 1    Number of Visits 4    Date for SLP Re-Evaluation 12/08/21    SLP Start Time 0900    SLP Stop Time  4008    SLP Time Calculation (min) 44 min    Activity Tolerance Patient tolerated treatment well             Past Medical History:  Diagnosis Date   Arthritis    Asthma    Asymptomatic varicose veins    Breast cancer ([place]) 06/2014   left   Cancer ([place])    Supra Glottis   Chronic airway obstruction ([place])    Complication of anesthesia 2016   difficulty waking up, Oxygen dropped low.   COPD (chronic obstructive pulmonary disease) ([facility])    Depression    GERD (gastroesophageal reflux disease)    Hemorrhage rectum    and anus.   Hypertension    Insomnia    Other symptoms involving digestive system(787.99)    Pain    Hx.pain in joint involving pelvic region and thigh; pain in limb   Palpitations    Panic attacks    Personal history of chemotherapy 2016   Personal history of radiation therapy 2016   Pneumonia    Pre-diabetes    Sinusitis    Symptomatic menopausal or female climacteric states    Varicose veins of both lower extremities with pain     Past Surgical History:  Procedure Laterality Date   BREAST BIOPSY Left 10/2012   BREAST BIOPSY Left 07/01/2014   malignant   BREAST LUMPECTOMY Left 08/20/2014   BRONCHIAL BIOPSY  08/23/2021   Procedure: BRONCHIAL BIOPSIES;  Surgeon: Collene Gobble, MD;  Location: [facility] ENDOSCOPY;  Service: Pulmonary;;   BRONCHIAL BRUSHINGS  08/23/2021   Procedure: BRONCHIAL BRUSHINGS;  Surgeon: Collene Gobble, MD;  Location: [facility] ENDOSCOPY;  Service:  Pulmonary;;   BRONCHIAL NEEDLE ASPIRATION BIOPSY  08/23/2021   Procedure: BRONCHIAL NEEDLE ASPIRATION BIOPSIES;  Surgeon: Collene Gobble, MD;  Location: [facility];  Service: Pulmonary;;   BRONCHIAL WASHINGS  08/23/2021   Procedure: BRONCHIAL WASHINGS;  Surgeon: Collene Gobble, MD;  Location: [facility] ENDOSCOPY;  Service: Pulmonary;;   CESAREAN SECTION     CHOLECYSTECTOMY  1985   COLONOSCOPY  2002   Dr. Lindalou Hose: internal hemorrhoids, one small rectal polyp, adenomatous path    COLONOSCOPY N/A 11/23/2015   Procedure: COLONOSCOPY;  Surgeon: Danie Binder, MD;  [facility]: [facility] END[facility];  Service: Endoscopy;  Laterality: N/A;  1100 - moved to 10:45 - office to notify   ESOPHAGOGASTRODUODENOSCOPY N/A 11/23/2015   Procedure: ESOPHAGOGASTRODUODENOSCOPY (EGD);  Surgeon: Danie Binder, MD;  [facility];  Service: Endoscopy;  Laterality: N/A;   PORT-A-CATH REMOVAL  02/2015   PORTACATH PLACEMENT Right 09/17/2014   Procedure: INSERTION PORT-A-CATH WITH ULTRASOUND;  Surgeon: Erroll Luna, MD;  Location: [facility];  Service: General;  Laterality: Right;  IJ   RADIOACTIVE SEED GUIDED PARTIAL MASTECTOMY WITH AXILLARY SENTINEL LYMPH NODE BIOPSY Left 08/20/2014   Procedure: LEFT BREAST LUMPECTOMY WITH RADIOACTIVE SEED LOCALIZATION AND SENTINEL LYMPH NODE MAPPING;  Surgeon: [name]  Cornett, MD;  Location: [facility];  Service: General;  Laterality: Left;   RE-EXCISION OF BREAST LUMPECTOMY Left 09/17/2014   Procedure: RE-EXCISION OF BREAST LUMPECTOMY;  Surgeon: Erroll Luna, MD;  Location: [facility];  Service: General;  Laterality: Left;   TOTAL HIP ARTHROPLASTY Right 02/22/2021   Procedure: RIGHT TOTAL HIP ARTHROPLASTY ANTERIOR APPROACH;  Surgeon: Marybelle Killings, MD;  Location: [facility];  Service: Orthopedics;  Laterality: Right;   TUBAL LIGATION     VIDEO BRONCHOSCOPY WITH ENDOBRONCHIAL ULTRASOUND N/A 08/23/2021   Procedure: VIDEO BRONCHOSCOPY WITH ENDOBRONCHIAL  ULTRASOUND;  Surgeon: Collene Gobble, MD;  Location: [facility] ENDOSCOPY;  Service: Pulmonary;  Laterality: N/A;   VIDEO BRONCHOSCOPY WITH RADIAL ENDOBRONCHIAL ULTRASOUND  08/23/2021   Procedure: VIDEO BRONCHOSCOPY WITH RADIAL ENDOBRONCHIAL ULTRASOUND;  Surgeon: Collene Gobble, MD;  Location: [facility] ENDOSCOPY;  Service: Pulmonary;;    There were no vitals filed for this visit.   Subjective Assessment - 09/09/21 0914     Subjective "It does hurt now when I swallow."    Currently in Pain? Yes    Pain Score 8    when swallowing   Pain Location Throat    Pain Descriptors / Indicators Sore;Pressure    Pain Type Acute pain                SLP Evaluation [facility] - 09/09/21 8101       SLP Visit Information   SLP Received On 09/09/21    Referring Provider (SLP) Eppie Gibson, MD    Onset Date August 2022    Medical Diagnosis Glottis Carcinoma      Subjective   Patient/Family Stated Goal "Keep my swallowing!"      General Information   HPI Pt diagnosed with glottis carcinoma, (T1, N1).  She developed hoarseness that never got better after hip replacement surgery August of 2022. 06/03/21 Thyroid US revealed suspicious nodule located in the mid right thyroid lobe, measuring 5.0 cm in the greatest extent, warranting further evaluation. 06/07/21 She saw Dr. Redmond Baseman who performed a laryngoscopy which revealed a granular mass of the right glottis suggestive of glottic cancer. 06/23/21 CT neck revealed revealed a 9 mm nodule located about the right vocal cord, consistent with history of glottic carcinoma. No enlarged lymph nodes were appreciated in the neck. An enlarged right paratracheal lymph node was appreciated as well, noted as suspicious for metastatic disease, as well as the right thyroid nodule measuring 39 x 34 mm. 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 07/23/21 CT chest revealed multiple enlarged lymph nodes, consistent with nodal metastatic disease; multiple small bilateral  pulmonary nodules (largest in the anterior left lower lobe measuring 1.1 x 0.7 cm) suspicious for pulmonary metastatic disease and additional nonspecific scattered smaller nodules. 07/23/21 PET demonstrated the small right glottic lesion as mildly hypermetabolic and consistent with known neoplasm. PET also demonstrated: a borderline right level 2 lymph node; left supraclavicular, mediastinal and hilar lymphadenopathy; and a hypermetabolic left lower lobe pulmonary nodule, noted as likely a metastatic focus. She will receive 20 fractions of radiation to her Larynx with a decreased dose to her right neck. She started radiation treatment on 08/16/21 and will complete on 09/13/21.      Prior Functional Status   Cognitive/Linguistic Baseline Within functional limits      Cognition   Overall Cognitive Status Within Functional Limits for tasks assessed      Auditory Comprehension   Overall Auditory Comprehension Appears  within functional limits for tasks assessed      Oral Motor/Sensory Function   Overall Oral Motor/Sensory Function Appears within functional limits for tasks assessed      Motor Speech   Overall Motor Speech Appears within functional limits for tasks assessed    Phonation Hoarse   mild           Pt currently tolerates dys III with some regular diet items, and thin liquids without reported coughing/choking/throat clearing during POs. Thyroid elevation with POs appeared WNL, and swallows appeared timely. Pt's swallow deemed WNL/WFL at this time.   Because data states the risk for dysphagia during and after radiation treatment is high due to undergoing radiation tx, SLP taught pt about the possibility of reduced/limited ability for PO intake during rad tx. SLP encouraged pt to continue swallowing POs as far into rad tx as possible, even ingesting POs and/or completing HEP shortly after administration of pain meds. Among other modifications for days when pt cannot functionally swallow,  SLP talked about performing only non-swallowing tasks on the handout/HEP, and if necessary to cycle through the swallowing portion so the program of exercises can be completed instead of fatiguing on one of the swallowing exercises and not being able to perform the other swallowing exercises. Pt was then told to add all reps of swallowing tasks back in when it becomes possible to do so.  SLP educated pt re: changes to swallowing musculature after rad tx, and why adherence to dysphagia HEP provided today and PO consumption was necessary to inhibit muscular disuse atrophy and to reduce muscle fibrosis following rad tx. Pt demonstrated understanding of these things to SLP.    SLP then developed a HEP for pt and pt was instructed how to perform exercises involving lingual, vocal, and pharyngeal strengthening. SLP performed each exercise and pt return demonstrated each exercise. SLP ensured pt performance was correct prior to moving on to next exercise. Pt was instructed to complete this program 2 times a day, 6-7 days/week until 6 months after her last rad tx, then x2 a week after that.                  SLP Education - 09/09/21 1353     Education Details late effects head/neck radiation on swallowing ability, HEP procedure    Person(s) Educated Patient    Methods Explanation;Demonstration;Verbal cues;Handout    Comprehension Verbalized understanding;Returned demonstration;Verbal cues required;Need further instruction              SLP Short Term Goals - 09/09/21 1359       SLP SHORT TERM GOAL #1   Title pt will complete HEP with rare min A    Time 2    Period --   sessions, for all STGs   Status New      SLP SHORT TERM GOAL #2   Title pt will tell SLP why pt is completing HEP with modified independence in 2 sessions    Time 2    Status New      SLP SHORT TERM GOAL #3   Title pt will tell SLP how a food journal could hasten return to a more normalized diet    Baseline 2     Status New    Target Date 11/05/21      SLP SHORT TERM GOAL #4   Title pt will describe 3 overt s/s aspiration PNA with modified independence    Time 3    Status New  Target Date 12/10/21              SLP Long Term Goals - 09/09/21 1401       SLP LONG TERM GOAL #1   Title pt will complete HEP with modified independence over two visits    Time 4    Period --   sessions, for all LTGs   Status New    Target Date 01/07/22      SLP LONG TERM GOAL #2   Title pt will describe how to modify HEP over time, and the timeline associated with reduction in HEP frequency with modified independence over two sessions    Time 5    Status New    Target Date 02/11/22              Plan - 09/09/21 1354     Clinical Impression Statement At this time pt swallowing is deemed WNL/WFL with dys III items/ thin liquids. Pt successfully ate ham sandwich and drank water today without overt oral stage deficits, nor overt s/sx pharyngeal deficits, including aspiration. SLP designed an individualized HEP for dysphagia and pt completed each exercise on their own with consistent min cues faded to modified independent. There are no overt s/s aspiration PNA reported by pt at this time. Data indicate that pt's swallow ability will likely decrease over the course of radiation/chemotherapy and could very well decline over time following conclusion of their radiation therapy due to muscle disuse atrophy and/or muscle fibrosis. Pt will cont to need to be seen by SLP in order to assess safety of PO intake, assess the need for recommending any objective swallow assessment, and ensuring pt correctly completes the individualized HEP.    Speech Therapy Frequency --   once approx every four weeks   Duration --   7 total visits - 3 during this reporting period   Treatment/Interventions Aspiration precaution training;Pharyngeal strengthening exercises;Trials of upgraded texture/liquids;Diet toleration management by  SLP;Cueing hierarchy;Patient/family education;SLP instruction and feedback;Compensatory strategies    Potential to Achieve Goals Good    Potential Considerations Medical prognosis    SLP Home Exercise Plan provided    Consulted and Agree with Plan of Care Patient             Patient will benefit from skilled therapeutic intervention in order to improve the following deficits and impairments:   Dysphagia, unspecified type    Problem List Patient Active Problem List   Diagnosis Date Noted   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   Pulmonary nodules 08/23/2021   Mediastinal adenopathy 08/06/2021   Thyroid nodule 08/06/2021   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   Abnormal CT of the chest 07/29/2021   History of total right hip arthroplasty 04/15/2021   COPD GOLD II  /  still smoking  10/12/2020   Shortness of breath 09/12/2016   Well woman exam with routine gynecological exam 04/20/2016   Morbid obesity due to excess calories ([place]) 03/03/2016   Colon adenomas 03/03/2016   Rash 01/18/2016   GERD (gastroesophageal reflux disease) 10/19/2015   Osteopenia determined by x-ray 09/07/2015   High risk medication use 09/07/2015   Genetic testing 03/20/2015   Family history of ovarian cancer 03/20/2015   Family history of colon cancer 03/20/2015   Family history of cancer 03/20/2015   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 09/09/2021, 2:03 PM  [facility] [address], [place], [place], [address] Phone: [phone]   Fax:  [phone]  Name: Regina Mcmahon MRN: [id number] Date of Birth: Jan 15, 1951

## 2021-09-09 NOTE — Therapy (Signed)
OUTPATIENT PHYSICAL THERAPY HEAD AND NECK BASELINE EVALUATION   Patient Name: LIZZY HAMRE MRN: [id number] DOB:11-Nov-1950, 71 y.o., female Today's Date: 09/09/2021    Past Medical History:  Diagnosis Date   Arthritis    Asthma    Asymptomatic varicose veins    Breast cancer ([place]) 06/2014   left   Cancer ([place])    Supra Glottis   Chronic airway obstruction ([place])    Complication of anesthesia 2016   difficulty waking up, Oxygen dropped low.   COPD (chronic obstructive pulmonary disease) (HCC)    Depression    GERD (gastroesophageal reflux disease)    Hemorrhage rectum    and anus.   Hypertension    Insomnia    Other symptoms involving digestive system(787.99)    Pain    Hx.pain in joint involving pelvic region and thigh; pain in limb   Palpitations    Panic attacks    Personal history of chemotherapy 2016   Personal history of radiation therapy 2016   Pneumonia    Pre-diabetes    Sinusitis    Symptomatic menopausal or female climacteric states    Varicose veins of both lower extremities with pain    Past Surgical History:  Procedure Laterality Date   BREAST BIOPSY Left 10/2012   BREAST BIOPSY Left 07/01/2014   malignant   BREAST LUMPECTOMY Left 08/20/2014   BRONCHIAL BIOPSY  08/23/2021   Procedure: BRONCHIAL BIOPSIES;  Surgeon: Collene Gobble, MD;  Location: [facility] ENDOSCOPY;  Service: Pulmonary;;   BRONCHIAL BRUSHINGS  08/23/2021   Procedure: BRONCHIAL BRUSHINGS;  Surgeon: Collene Gobble, MD;  Location: [facility] ENDOSCOPY;  Service: Pulmonary;;   BRONCHIAL NEEDLE ASPIRATION BIOPSY  08/23/2021   Procedure: BRONCHIAL NEEDLE ASPIRATION BIOPSIES;  Surgeon: Collene Gobble, MD;  Location: [place];  Service: Pulmonary;;   BRONCHIAL WASHINGS  08/23/2021   Procedure: BRONCHIAL WASHINGS;  Surgeon: Collene Gobble, MD;  Location: [facility] ENDOSCOPY;  Service: Pulmonary;;   CESAREAN SECTION     CHOLECYSTECTOMY  1985   COLONOSCOPY  2002   Dr. Lindalou Hose: internal hemorrhoids, one  small rectal polyp, adenomatous path    COLONOSCOPY N/A 11/23/2015   Procedure: COLONOSCOPY;  Surgeon: Danie Binder, MD;  [facility]: [facility];  Service: Endoscopy;  Laterality: N/A;  1100 - moved to 10:45 - office to notify   ESOPHAGOGASTRODUODENOSCOPY N/A 11/23/2015   Procedure: ESOPHAGOGASTRODUODENOSCOPY (EGD);  Surgeon: Danie Binder, MD;  [facility];  Service: Endoscopy;  Laterality: N/A;   PORT-A-CATH REMOVAL  02/2015   PORTACATH PLACEMENT Right 09/17/2014   Procedure: INSERTION PORT-A-CATH WITH ULTRASOUND;  Surgeon: Erroll Luna, MD;  Location: [place];  Service: General;  Laterality: Right;  IJ   RADIOACTIVE SEED GUIDED PARTIAL MASTECTOMY WITH AXILLARY SENTINEL LYMPH NODE BIOPSY Left 08/20/2014   Procedure: LEFT BREAST LUMPECTOMY WITH RADIOACTIVE SEED LOCALIZATION AND SENTINEL LYMPH NODE MAPPING;  Surgeon: Erroll Luna, MD;  Location: [place];  Service: General;  Laterality: Left;   RE-EXCISION OF BREAST LUMPECTOMY Left 09/17/2014   Procedure: RE-EXCISION OF BREAST LUMPECTOMY;  Surgeon: Erroll Luna, MD;  Location: [place];  Service: General;  Laterality: Left;   TOTAL HIP ARTHROPLASTY Right 02/22/2021   Procedure: RIGHT TOTAL HIP ARTHROPLASTY ANTERIOR APPROACH;  Surgeon: Marybelle Killings, MD;  Location: [place];  Service: Orthopedics;  Laterality: Right;   TUBAL LIGATION     VIDEO BRONCHOSCOPY WITH ENDOBRONCHIAL ULTRASOUND N/A 08/23/2021   Procedure: VIDEO BRONCHOSCOPY WITH ENDOBRONCHIAL ULTRASOUND;  Surgeon: Collene Gobble, MD;  Location: [facility] ENDOSCOPY;  Service: Pulmonary;  Laterality: N/A;   VIDEO BRONCHOSCOPY WITH RADIAL ENDOBRONCHIAL ULTRASOUND  08/23/2021   Procedure: VIDEO BRONCHOSCOPY WITH RADIAL ENDOBRONCHIAL ULTRASOUND;  Surgeon: Collene Gobble, MD;  Location: [facility] ENDOSCOPY;  Service: Pulmonary;;   Patient Active Problem List   Diagnosis Date Noted   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   Pulmonary  nodules 08/23/2021   Mediastinal adenopathy 08/06/2021   Thyroid nodule 08/06/2021   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   Abnormal CT of the chest 07/29/2021   History of total right hip arthroplasty 04/15/2021   COPD GOLD II  /  still smoking  10/12/2020   Shortness of breath 09/12/2016   Well woman exam with routine gynecological exam 04/20/2016   Morbid obesity due to excess calories ([place]) 03/03/2016   Colon adenomas 03/03/2016   Rash 01/18/2016   GERD (gastroesophageal reflux disease) 10/19/2015   Osteopenia determined by x-ray 09/07/2015   High risk medication use 09/07/2015   Genetic testing 03/20/2015   Family history of ovarian cancer 03/20/2015   Family history of colon cancer 03/20/2015   Family history of cancer 03/20/2015   Carcinoma of upper-outer quadrant of left female breast ([place]) 09/03/2014   Anxiety 10/29/2012   Blood in stool 10/29/2012   Pain 10/29/2012   Chronic airway obstruction ([place]) 10/29/2012   Diarrhea 10/29/2012   Cigarette smoker 10/29/2012   Essential hypertension 10/29/2012    PCP: [name], MD  REFERRING PROVIDER: Eppie Gibson, MD  REFERRING DIAG: C32.1 (ICD-10-CM) - 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[place])   THERAPY DIAG:  Abnormal posture  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[place])  ONSET DATE: 06/25/21  SUBJECTIVE    SUBJECTIVE STATEMENT: Patient reports they are here today to be seen by their medical team for her newly diagnosed supraglottic cancer.   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 06/07/21 She saw Dr. Redmond Baseman who performed a laryngoscopy which revealed a granular mass of the right glottis suggestive of glottic cancer. 06/23/21 CT neck revealed revealed a 9 mm nodule located about the right vocal cord, consistent with history of glottic  carcinoma. No enlarged lymph nodes were appreciated in the neck. An enlarged right paratracheal lymph node was appreciated as well, noted as suspicious for metastatic disease, as well as the right thyroid nodule measuring 39 x 34 mm. 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 07/23/21 CT chest revealed multiple enlarged mediastinal, bilateral hilar, and left supraclavicular lymph nodes, consistent with nodal metastatic disease; multiple small bilateral pulmonary nodules (largest in the anterior left lower lobe measuring 1.1 x 0.7 cm) suspicious for pulmonary metastatic disease; additional nonspecific scattered smaller nodules measuring no greater than 0.4 cm; and enlarged, multinodular right lobe of the thyroid. 07/23/21 PET demonstrated the small right glottic lesion as mildly hypermetabolic and consistent with known neoplasm. PET also demonstrated: a borderline right level 2 lymph node; left supraclavicular, mediastinal and hilar lymphadenopathy; and a hypermetabolic left lower lobe pulmonary nodule, noted as likely a metastatic focus. No other findings for abdominal/pelvic metastatic disease or osseous metastatic disease were appreciated. Will receive 20 fractions of radiation to her Larynx with a decreased dose to her right neck. She started radiation treatment on 08/16/21 and will complete on 09/13/21. She has a history of stage IIB Left breast  carcinoma diagnosed in 2016. She received radiation and chemotherapy.  PATIENT GOALS   to be educated about the signs and symptoms of lymphedema and learn post op HEP.   PAIN:  Are you having pain? Yes NPRS scale: 8/10 Pain location: throat Pain orientation: Bilateral  PAIN TYPE: burning Pain description: intermittent  Aggravating factors: swallowing Relieving factors: milkshakes  PRECAUTIONS: Active CA and Joint replacement THA R 02/2021  WEIGHT BEARING RESTRICTIONS No  FALLS:  Has patient fallen in last 6 months? No Does the patient  have a fear of falling that limits activity? No Is the patient reluctant to leave the house due to a fear of falling?No  LIVING ENVIRONMENT: Patient lives with: alone Lives in: House/apartment moving from a mobile home to an apartment in the next few weeks Has following equipment at home: Lobbyist, Environmental consultant - 2 wheeled, Electronics engineer, and Grab bars  OCCUPATION: retired  LEISURE: pt does not exercise  PRIOR LEVEL OF FUNCTION: Independent   OBJECTIVE  COGNITION:           Overall cognitive status: Within functional limits for tasks assessed                  POSTURE:  Forward head and rounded shoulders posture   30 SEC SIT TO STAND: 10 reps in 30 sec without use of UEs which is  Below averagefor patient's age. Pt has COPD and lung cancer which affects this.    SHOULDER AROM:   WFL    CERVICAL AROM:   Percent limited  Flexion WFL  Extension WFL  Right lateral flexion WFL  Left lateral flexion WFL  Right rotation WFL  Left rotation WFL    (Blank rows=not tested)   GAIT:  Assessed: Yes Assistance needed: Independent  Ambulation Distance: 10 feet  Assistive Device: none Gait pattern: WFL increased ER of R LE due to THA Ambulation surface: Level  PATIENT EDUCATION:  Education details: Neck ROM, importance of posture when sitting, standing and lying down, deep breathing, walking program and importance of staying active throughout treatment, CURE article on staying active, "Why exercise?" flyer, lymphedema and PT info Person educated: Patient Education method: Explanation, Demonstration, Handout Education comprehension: Patient verbalized understanding and returned demonstration   HOME EXERCISE PROGRAM: Patient was instructed today in a home exercise program today for head and neck range of motion exercises. These included active cervical flexion, active cervical extension, active cervical rotation to each direction, upper trap stretch, and shoulder  retraction. Patient was encouraged to do these 2-3 times a day, holding for 5 sec each and completing for 5 reps. Pt was educated that once this becomes easier then hold the stretches for 30-60 seconds.    ASSESSMENT:  CLINICAL IMPRESSION: Pt arrives to PT with recently diagnosed glottis cancer. Pt will undergo radiation. Pt will start treatment on 08/16/21 and complete on 09/13/21.  Pt's cervical ROM was [facility]. Educated pt about signs and symptoms of lymphedema as well as anatomy and physiology of lymphatic system. Educated pt in importance of staying as active as possible throughout treatment to decrease fatigue as well as head and neck ROM exercises to decrease loss of ROM. Will see pt after completion of radiation to reassess ROM and assess for lymphedema and to determine therapy needs at that time.  Pt will benefit from skilled therapeutic intervention to improve on the following deficits: Decreased knowledge of precautions and postural dysfunction. Other deficits: decreased knowledge of condition and postural dysfunction  PT  treatment/interventions: ADL/self-care home management, pt/family education, therapeutic exercise. Other interventions Therapeutic exercises and Patient/Family education  REHAB POTENTIAL: Good  CLINICAL DECISION MAKING: Stable/uncomplicated  EVALUATION COMPLEXITY: Low   GOALS: Goals reviewed with patient? YES  LONG TERM GOALS: (STG=LTG)   Name Target Date Goal status  1 Patient will be able to verbalize understanding of a home exercise program for cervical range of motion, posture, and walking.   Baseline:  No knowledge 09/09/2021 Achieved at eval  2 Patient will be able to verbalize understanding of proper sitting and standing posture. Baseline:  No knowledge 09/09/2021 Achieved at eval  3 Patient will be able to verbalize understanding of lymphedema risk and availability of treatment for this condition Baseline:  No knowledge 09/09/2021 Achieved at eval  4 Pt will  demo she has regained full cervical ROM and function post operatively compared to baselines and not demonstrate any signs or symptoms of lymphedema.  Baseline: See objective measurements taken today. 10/21/2021 New     PLAN: PT FREQUENCY/DURATION: EVAL and 1 follow up appointment.   PLAN FOR NEXT SESSION: will reassess 2 weeks after completion of radiation to determine needs.  Patient will follow up at outpatient cancer rehab 2 weeks after completion of radiation.  If the patient requires physical therapy at that time, a specific plan will be dictated and sent to the referring physician for approval. The patient was educated today on appropriate basic range of motion exercises to begin now and continue throughout radiation and educated on the signs and symptoms of lymphedema. Patient verbalized good understanding.     Physical Therapy Information for During and After Head/Neck Cancer Treatment: Lymphedema is a swelling condition that you may be at risk for in your neck and/or face if you have radiation treatment to the area and/or if you have surgery that includes removing lymph nodes.  There is treatment available for this condition and it is not life-threatening.  Contact your physician or physical therapist with concerns. An excellent resource for those seeking information on lymphedema is the National Lymphedema Network's website.  It can be accessed at [place].org If you notice swelling in your neck or face at any time following surgery (even if it is many years from now), please contact your doctor or physical therapist to discuss this.  Lymphedema can be treated at any time but it is easier for you if it is treated early on. If you have had surgery to your neck, please check with your surgeon about how soon to start doing neck range of motion exercises.  If you are not having surgery, I encourage you to start doing neck range of motion exercises today and continue these while undergoing  treatment, UNLESS you have irritation of your skin or soft tissue that is aggravated by doing them.  These exercises are intended to help you prevent loss of range of motion and/or to gain range of motion in your neck (which can be limited by tightening effects of radiation), and NOT to aggravate these tissues if they develop sensitivities from treatment. Neck range of motion exercises should be done to the point of feeling a GENTLE, TOLERABLE stretch only.  You are encouraged to start a walking or other exercise program tomorrow and continue this as much as you are able through and after treatment.  Please feel free to call me with any questions. Manus Gunning, PT, CLT Physical Therapist and Certified Lymphedema Therapist [facility] [address], [place], [place] [address] [phone]  [phone] [email or web address]  WALKING  Walking is a great form of exercise to increase your strength, endurance and overall fitness.  A walking program can help you start slowly and gradually build endurance as you go.  Everyone's ability is different, so each person's starting point will be different.  You do not have to follow them exactly.  The are just samples. You should simply find out what's right for you and stick to that program.   In the beginning, you'll start off walking 2-3 times a day for short distances.  As you get stronger, you'll be walking further at just 1-2 times per day.  A. You Can Walk For A Certain Length Of Time Each Day    Walk 5 minutes 3 times per day.  Increase 2 minutes every 2 days (3 times per day).  Work up to 25-30 minutes (1-2 times per day).   Example:   Day 1-2 5 minutes 3 times per day   Day 7-8 12 minutes 2-3 times per day   Day 13-14 25 minutes 1-2 times per day  B. You Can Walk For a Certain Distance Each Day     Distance can be substituted for time.    Example:   3 trips to mailbox (at road)   3 trips to corner of  block   3 trips around the block  C. Go to local high school and use the track.    Walk for distance ____ around track  Or time ____ minutes  D. Walk ____ Jog ____ Run ___   Why exercise?  So many benefits! Here are SOME of them: Heart health, including raising your good cholesterol level and reducing heart rate and blood pressure Lung health, including improved lung capacity It burns fats, and most of [place] can stand to be leaner, whether or not we are overweight. It increases the body's natural painkillers and mood elevators, so makes you feel better. Not only makes you feel better, but look better too Improves sleep Takes a bite out of stress May decrease your risk of many types of cancer If you are currently undergoing cancer treatment, exercise may improve your ability to tolerate treatments including chemotherapy. For everybody, it can improve your energy level. Those with cancer-related fatigue report a 40-50% reduction in this symptom when exercising regularly. If you are a survivor of breast, colon, or prostate cancer, it may decrease your risk of a recurrence. (This may hold for other cancers too, but so far we have data just for these three types.)  How to exercise: Get your doctor's okay. Pick something you enjoy doing, like walking, Zumba, biking, swimming, or whatever. Start at low intensity and time, then gradually increase.  (See walking program handout.) Set a goal to achieve over time.  The American Cancer Society, American Heart Association, and U.S. Dept. of Health and Human Services recommend 150 minutes of moderate exercise, 75 minutes of vigorous exercise, or a combination of both per week. This should be done in episodes at least 10 minutes long, spread throughout the week.  Need help being motivated? Pick something you enjoy doing, because you'll be more inclined to stick with that activity than something that feels like a chore. Do it with a friend so that you  are accountable to each other. Schedule it into your day. Place it on your calendar and keep that appointment just like you do any appointment that you make. Join an exercise group that meets at a specific time.  That way, you have to show up on time, and that makes it harder to procrastinate about doing your workout.  It also keeps you accountable--people begin to expect you to be there. Join a gym where you feel comfortable and not intimidated, at the right cost. Sign up for something that you'll need to be in shape for on a specific date, like a 1K or a 5K to walk or run, a 20 or 30 mile bike ride, a mud run or something like that. If the date is looming, you know you'll need to train to be ready for it.  An added benefit is that many of these are fundraisers for good causes. If you've already paid for a gym membership, group exercise class or event, you might as well work out, so you haven't wasted your money!    [facility] [place], PT 09/09/2021, 9:31 AM

## 2021-09-10 ENCOUNTER — Ambulatory Visit
Admission: RE | Admit: 2021-09-10 | Discharge: 2021-09-10 | Disposition: A | Discharge status: Home / Self Care | Payer: Medicare HMO | Source: Ambulatory Visit | Attending: Radiation Oncology | Admitting: Radiation Oncology

## 2021-09-10 DIAGNOSIS — Z510 Encounter for antineoplastic radiation therapy: Secondary | ICD-10-CM | POA: Diagnosis not present

## 2021-09-10 DIAGNOSIS — C321 Malignant neoplasm of supraglottis: Secondary | ICD-10-CM | POA: Diagnosis not present

## 2021-09-10 DIAGNOSIS — C320 Malignant neoplasm of glottis: Secondary | ICD-10-CM | POA: Diagnosis not present

## 2021-09-13 ENCOUNTER — Encounter: Payer: Self-pay | Admitting: General Practice

## 2021-09-13 ENCOUNTER — Ambulatory Visit
Admission: RE | Admit: 2021-09-13 | Discharge: 2021-09-13 | Disposition: A | Discharge status: Home / Self Care | Payer: Medicare HMO | Source: Ambulatory Visit | Attending: Radiation Oncology | Admitting: Radiation Oncology

## 2021-09-13 ENCOUNTER — Other Ambulatory Visit: Payer: Self-pay

## 2021-09-13 ENCOUNTER — Encounter: Payer: Self-pay | Admitting: Radiation Oncology

## 2021-09-13 DIAGNOSIS — Z510 Encounter for antineoplastic radiation therapy: Secondary | ICD-10-CM | POA: Diagnosis not present

## 2021-09-13 DIAGNOSIS — C320 Malignant neoplasm of glottis: Secondary | ICD-10-CM | POA: Diagnosis not present

## 2021-09-13 DIAGNOSIS — C321 Malignant neoplasm of supraglottis: Secondary | ICD-10-CM | POA: Diagnosis not present

## 2021-09-13 NOTE — Progress Notes (Signed)
Oncology Nurse Navigator Documentation   Met with Ms. Jacqualin Combes after final RT to offer support and to celebrate end of radiation treatment.   Provided verbal/written post-RT guidance: Importance of keeping all follow-up appts, especially those with Nutrition and SLP. Importance of protecting treatment area from sun. Continuation of Sonafine application 2-3 times daily, application of antibiotic ointment to areas of raw skin; when supply of Sonafine exhausted transition to OTC lotion with vitamin E. Provided/reviewed Epic calendar of upcoming appts. Explained my role as navigator will continue for several more months, encouraged her to call me with needs/concerns.    Harlow Asa RN, BSN, OCN Head & Neck Oncology Nurse [place] at [facility] Phone # [phone]  Fax # [phone]

## 2021-09-13 NOTE — Progress Notes (Signed)
[facility] Spiritual Care Note  Spoke with Regina Mcmahon by phone, just briefly in order to spare her voice. This was a tough and uncomfortable weekend as she reaches the end of her head and neck radiation.  Regina Mcmahon is very appreciative of chaplain support and loved the care package, especially the handmade blessing blanket. We plan to follow up by phone in ca two weeks as she copes with healing from her current treatment and adding care for her lung cancer. She knows to reach out as needed/desired in the meantime.    [place], [place], [facility] Pager [phone] Voicemail [phone]

## 2021-09-21 ENCOUNTER — Other Ambulatory Visit: Payer: Self-pay | Admitting: Radiation Oncology

## 2021-09-21 DIAGNOSIS — C321 Malignant neoplasm of supraglottis: Secondary | ICD-10-CM

## 2021-09-21 MED ORDER — HYDROCODONE-ACETAMINOPHEN 7.5-325 MG/15ML PO SOLN: 10.0000 mL | ORAL | 0 refills | Status: DC | PRN

## 2021-09-22 ENCOUNTER — Other Ambulatory Visit: Payer: Self-pay | Admitting: Radiation Oncology

## 2021-09-22 DIAGNOSIS — C320 Malignant neoplasm of glottis: Secondary | ICD-10-CM

## 2021-09-22 MED ORDER — HYDROCODONE-ACETAMINOPHEN 10-325 MG PO TABS: 1.0000 | ORAL_TABLET | ORAL | 0 refills | Status: DC | PRN

## 2021-09-24 ENCOUNTER — Inpatient Hospital Stay: Payer: Medicare HMO | Attending: Hematology and Oncology

## 2021-09-24 ENCOUNTER — Inpatient Hospital Stay: Payer: Medicare HMO | Admitting: Hematology and Oncology

## 2021-09-24 ENCOUNTER — Other Ambulatory Visit: Payer: Self-pay

## 2021-09-24 DIAGNOSIS — Z87891 Personal history of nicotine dependence: Secondary | ICD-10-CM | POA: Insufficient documentation

## 2021-09-24 DIAGNOSIS — R232 Flushing: Secondary | ICD-10-CM | POA: Insufficient documentation

## 2021-09-24 DIAGNOSIS — J449 Chronic obstructive pulmonary disease, unspecified: Secondary | ICD-10-CM | POA: Insufficient documentation

## 2021-09-24 DIAGNOSIS — Z818 Family history of other mental and behavioral disorders: Secondary | ICD-10-CM | POA: Diagnosis not present

## 2021-09-24 DIAGNOSIS — C3432 Malignant neoplasm of lower lobe, left bronchus or lung: Secondary | ICD-10-CM | POA: Diagnosis not present

## 2021-09-24 DIAGNOSIS — Z79899 Other long term (current) drug therapy: Secondary | ICD-10-CM | POA: Insufficient documentation

## 2021-09-24 DIAGNOSIS — Z8041 Family history of malignant neoplasm of ovary: Secondary | ICD-10-CM | POA: Diagnosis not present

## 2021-09-24 DIAGNOSIS — C50412 Malignant neoplasm of upper-outer quadrant of left female breast: Secondary | ICD-10-CM

## 2021-09-24 DIAGNOSIS — C3490 Malignant neoplasm of unspecified part of unspecified bronchus or lung: Secondary | ICD-10-CM | POA: Diagnosis not present

## 2021-09-24 DIAGNOSIS — Z8051 Family history of malignant neoplasm of kidney: Secondary | ICD-10-CM | POA: Diagnosis not present

## 2021-09-24 DIAGNOSIS — Z853 Personal history of malignant neoplasm of breast: Secondary | ICD-10-CM | POA: Diagnosis not present

## 2021-09-24 DIAGNOSIS — Z833 Family history of diabetes mellitus: Secondary | ICD-10-CM | POA: Diagnosis not present

## 2021-09-24 DIAGNOSIS — R490 Dysphonia: Secondary | ICD-10-CM | POA: Diagnosis not present

## 2021-09-24 DIAGNOSIS — Z800 Family history of malignant neoplasm of digestive organs: Secondary | ICD-10-CM | POA: Insufficient documentation

## 2021-09-24 DIAGNOSIS — J029 Acute pharyngitis, unspecified: Secondary | ICD-10-CM | POA: Insufficient documentation

## 2021-09-24 DIAGNOSIS — Z170 Estrogen receptor positive status [ER+]: Secondary | ICD-10-CM

## 2021-09-24 DIAGNOSIS — Z923 Personal history of irradiation: Secondary | ICD-10-CM | POA: Insufficient documentation

## 2021-09-24 DIAGNOSIS — C321 Malignant neoplasm of supraglottis: Secondary | ICD-10-CM | POA: Diagnosis not present

## 2021-09-24 DIAGNOSIS — Z8249 Family history of ischemic heart disease and other diseases of the circulatory system: Secondary | ICD-10-CM | POA: Insufficient documentation

## 2021-09-24 DIAGNOSIS — Z808 Family history of malignant neoplasm of other organs or systems: Secondary | ICD-10-CM | POA: Diagnosis not present

## 2021-09-24 DIAGNOSIS — Z9049 Acquired absence of other specified parts of digestive tract: Secondary | ICD-10-CM | POA: Insufficient documentation

## 2021-09-24 DIAGNOSIS — Z8719 Personal history of other diseases of the digestive system: Secondary | ICD-10-CM | POA: Diagnosis not present

## 2021-09-24 DIAGNOSIS — Z79811 Long term (current) use of aromatase inhibitors: Secondary | ICD-10-CM | POA: Diagnosis not present

## 2021-09-24 LAB — CBC WITH DIFFERENTIAL/PLATELET
Abs Immature Granulocytes: 0.02 10*3/uL (ref 0.00–0.07)
Basophils Absolute: 0 10*3/uL (ref 0.0–0.1)
Basophils Relative: 1 %
Eosinophils Absolute: 0.4 10*3/uL (ref 0.0–0.5)
Eosinophils Relative: 6 %
HCT: 38.2 % (ref 36.0–46.0)
Hemoglobin: 13 g/dL (ref 12.0–15.0)
Immature Granulocytes: 0 %
Lymphocytes Relative: 11 %
Lymphs Abs: 0.7 10*3/uL (ref 0.7–4.0)
MCH: 30 pg (ref 26.0–34.0)
MCHC: 34 g/dL (ref 30.0–36.0)
MCV: 88.2 fL (ref 80.0–100.0)
Monocytes Absolute: 0.4 10*3/uL (ref 0.1–1.0)
Monocytes Relative: 6 %
Neutro Abs: 5 10*3/uL (ref 1.7–7.7)
Neutrophils Relative %: 76 %
Platelets: 200 10*3/uL (ref 150–400)
RBC: 4.33 MIL/uL (ref 3.87–5.11)
RDW: 13.3 % (ref 11.5–15.5)
WBC: 6.6 10*3/uL (ref 4.0–10.5)
nRBC: 0 % (ref 0.0–0.2)

## 2021-09-24 LAB — COMPREHENSIVE METABOLIC PANEL
ALT: 6 U/L (ref 0–44)
AST: 9 U/L — ABNORMAL LOW (ref 15–41)
Albumin: 4 g/dL (ref 3.5–5.0)
Alkaline Phosphatase: 76 U/L (ref 38–126)
Anion gap: 8 (ref 5–15)
BUN: 15 mg/dL (ref 8–23)
CO2: 34 mmol/L — ABNORMAL HIGH (ref 22–32)
Calcium: 10.1 mg/dL (ref 8.9–10.3)
Chloride: 100 mmol/L (ref 98–111)
Creatinine, Ser: 1.03 mg/dL — ABNORMAL HIGH (ref 0.44–1.00)
GFR, Estimated: 59 mL/min — ABNORMAL LOW (ref >60)
Glucose, Bld: 111 mg/dL — ABNORMAL HIGH (ref 70–99)
Potassium: 3.1 mmol/L — ABNORMAL LOW (ref 3.5–5.1)
Sodium: 142 mmol/L (ref 135–145)
Total Bilirubin: 0.8 mg/dL (ref 0.3–1.2)
Total Protein: 7.4 g/dL (ref 6.5–8.1)

## 2021-09-24 LAB — CEA (IN HOUSE-CHCC): CEA (CHCC-In House): 6.05 ng/mL — ABNORMAL HIGH (ref 0.00–5.00)

## 2021-09-24 NOTE — Progress Notes (Signed)
Physical examination deferred in lieu of counseling.  [facility] CONSULT NOTE  Patient Care Team: [name], MD as PCP - General (Internal Medicine) Herminio Commons, MD (Inactive) as PCP - Cardiology (Cardiology) Whitney Muse, Kelby Fam, MD (Inactive) as Consulting Physician (Hematology and Oncology) Erroll Luna, MD as Consulting Physician (General Surgery) Thea Silversmith, MD as Consulting Physician (Radiation Oncology) Sylvan Cheese, NP as Nurse Practitioner (Hematology and Oncology) Danie Binder, MD (Inactive) as Consulting Physician (Gastroenterology) Malmfelt, Stephani Police, RN as Oncology Nurse Navigator Melida Quitter, MD as Consulting Physician (Otolaryngology) Eppie Gibson, MD as Consulting Physician (Radiation Oncology) Benay Pike, MD as Consulting Physician (Hematology and Oncology)  CHIEF COMPLAINTS/PURPOSE OF CONSULTATION:  Newly diagnosed glottic cancer  HISTORY OF PRESENTING ILLNESS:  Regina Mcmahon 71 y.o. 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  She had prior medical history of left breast cancer.  I reviewed her records extensively and collaborated the history with the patient.  SUMMARY OF ONCOLOGIC HISTORY: Oncology History  Carcinoma of upper-outer quadrant of left female breast ([place])  06/24/2014 Mammogram   Left breast: 6 x 3 x 2.5 cm area of ill-defined increased density in the UOQ,  corresponding to the mass felt by the patient, not in the same location of the previously biopsied benign calcifications.    06/24/2014 Breast US   Left breast: ill-defined predominantly hypoechoic area with some increased echogenicity in the 2 o'clock position of the left breast, 7 cm from the nipple. 2.6 x 2.3 x 1.8 cm in maximum dimensions.   07/01/2014 Initial Biopsy   Left breast core needle bx: Invasive mammary carcinoma with  lobular features, ER+ (100%), PR+ (91%), HER2/neu negative (ratio 1.09), Ki-67 32%. E-cadherin strongly positive, diagnostic for IDC    07/01/2014 Clinical Stage   Stage IIA: T2 N0   08/20/2014 Definitive Surgery   Left breast seed localized lumpectomy/SLNB: IDC, +LVI, DCIS, 1 LN removed and positive for metastatic carcinoma. Grade 2. HER2/neu repeated and negative (ratio 0.69-1.9).    08/20/2014 Pathologic Stage   Stage IIB: pT2 pN1a pMx   09/17/2014 Surgery   Port-a cath placement and re-excision   10/02/2014 - 01/15/2015 Chemotherapy   CMF x 6 cycles.  patient refused Taxane-containing chemotherapy.   03/07/2015 Procedure   Comp Cancer Panel reveals no variant at APC, ATM, AXIN2, BARD1, BMPR1A, BRCA1, BRCA2, BRIP1, CDH1, CDK4, CDKN2A, CHEK2, FANCC, MLH1, MSH2, MSH6, MUTYH, NBN, PALB2, PMS2, POLD1, POLE, PTEN, RAD51C, RAD51D, SMAD4, STK11, TP53, VHL, and XRCC2.    04/06/2015 - 05/21/2015 Radiation Therapy   Adjuvant RT Pablo Ledger): Left breast/ 45 Gy over 25 fractions.   Left supraclavicular fossa and axilla/ 45 Gy over 25 fractions. Left breast boost/ 16 Gy over 8 fractions. Total dose: 61 Gy   06/04/2015 - 08/07/2015 Anti-estrogen oral therapy   Exemestane 25 mg daily.  Planned duration of therapy 5 years.    07/23/2015 Survivorship   Survivorship care plan completed and mailed to patient in lieu of in person visit   08/06/2015 Adverse Reaction   Severe hot flashes and mood swings   08/08/2015 - 12/07/2015 Anti-estrogen oral therapy   Arimidex   12/04/2015 Adverse Reaction   Worsening depression, patient stopped Arimidex   12/07/2015 -  Anti-estrogen oral therapy   Tamoxifen   Squamous cell lung cancer ([place])  08/31/2021 Initial Diagnosis   Squamous cell carcinoma of lung ([place])  09/08/2021 Cancer Staging   Staging form: Lung, AJCC 8th Edition - Clinical stage from 09/08/2021: Stage IIIB (cT1b, cN3, cM0) - Signed by Eppie Gibson, MD on 09/08/2021 Stage prefix: Initial diagnosis      She is currently on adjuvant tamoxifen.  She was recently seen for chief complaint of raspiness of her voice which then progressed to severe hoarseness.  She became so uncomfortable that she could not talk.  She denied any problem with swallowing or breathing.  She had thyroid ultrasound which revealed a suspicious nodule in the mid right thyroid lobe measuring 5 cm in greatest extent warranting further evaluation.  Subsequently the patient saw Dr. Redmond Baseman who performed a laryngoscopy which revealed a granular mass of right glottis suggestive of glottic cancer.  She then had CT neck which revealed a 9 mm nodule about right vocal cord consistent with history of glottic carcinoma, no enlarged lymph nodes.  There was however an enlarged right paratracheal lymph node which was thought to be suspicious for metastatic disease as well as the right thyroid nodule measuring 3.9 x 3.4 cm.  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  She then had imaging of the chest which revealed multiple enlarged mediastinal, bilateral hilar, left supraclavicular lymph nodes consistent with nodal metastatic disease, multiple small bilateral pulmonary nodules suspicious for pulmonary metastatic disease, additional nonspecific small nodules.  She then had PET/CT which demonstrated small right glottic lesion is mildly hypermetabolic and consistent with no neoplasm.  PET also demonstrated borderline right level 2 lymph node left supraclavicular, mediastinal and hilar lymphadenopathy and hypermetabolic left lower lobe pulmonary nodule all suspicious of metastatic focus.  No abdominal pelvic metastatic disease or osseous metastatic disease was appreciated.  Cytology from thyroid biopsy showed a benign follicular nodule Bethesda category 2 Lung biopsy showed NSCLC consistent with squamous cell carcinoma, PDL1 pending  She saw my colleague Dr. Isidore Moos and recommendation was to consider radiation for  the glottic cancer and to also proceed with bronchoscopy to look at the etiology of the nodes in her chest.  In the past she had breast cancer which was grade 2 ER 100%, PR 91% and HER2 negative KI of 32%  She is a heavy smoker, smoked 2 PPD , started at the age of 69. She took Tamoxifen for almost 6 yrs, stopped it before hip replacement in Aug 2022.  Interval history  Since her last visit, she completed radiation to the neck. She is here with her sister. She was emotional and very upset today, she cried for a while that this has happened to her and she caused it to herself. She was repeatedly worried about how she can tolerate chemotherapy, what her prognosis without treatment is and why surgery is not an option She says radiation made her so tired that she is worried about having to take this again. She says breast cancer chemo didn't make her sick at all but the she is worried about this chemo and radiation. She is upset that she may lose hair. She hasnt been eating much because of the sore throat.  MEDICAL HISTORY:  Past Medical History:  Diagnosis Date   Arthritis    Asthma    Asymptomatic varicose veins    Breast cancer ([place]) 06/2014   left   Cancer ([place])    Supra Glottis   Chronic airway obstruction (HCC)    Complication of anesthesia 2016   difficulty waking up, Oxygen dropped low.   COPD (chronic  obstructive pulmonary disease) (HCC)    Depression    GERD (gastroesophageal reflux disease)    Hemorrhage rectum    and anus.   Hypertension    Insomnia    Other symptoms involving digestive system(787.99)    Pain    Hx.pain in joint involving pelvic region and thigh; pain in limb   Palpitations    Panic attacks    Personal history of chemotherapy 2016   Personal history of radiation therapy 2016   Pneumonia    Pre-diabetes    Sinusitis    Symptomatic menopausal or female climacteric states    Varicose veins of both lower extremities with pain     SURGICAL  HISTORY: Past Surgical History:  Procedure Laterality Date   BREAST BIOPSY Left 10/2012   BREAST BIOPSY Left 07/01/2014   malignant   BREAST LUMPECTOMY Left 08/20/2014   BRONCHIAL BIOPSY  08/23/2021   Procedure: BRONCHIAL BIOPSIES;  Surgeon: Collene Gobble, MD;  Location: [facility];  Service: Pulmonary;;   BRONCHIAL BRUSHINGS  08/23/2021   Procedure: BRONCHIAL BRUSHINGS;  Surgeon: Collene Gobble, MD;  Location: [facility];  Service: Pulmonary;;   BRONCHIAL NEEDLE ASPIRATION BIOPSY  08/23/2021   Procedure: BRONCHIAL NEEDLE ASPIRATION BIOPSIES;  Surgeon: Collene Gobble, MD;  Location: [facility] ENDOS[facility];  Service: Pulmonary;;   BRONCHIAL WASHINGS  08/23/2021   Procedure: BRONCHIAL WASHINGS;  Surgeon: Collene Gobble, MD;  Location: [facility];  Service: Pulmonary;;   CESAREAN SECTION     CHOLECYSTECTOMY  1985   COLONOSCOPY  2002   Dr. Lindalou Hose: internal hemorrhoids, one small rectal polyp, adenomatous path    COLONOSCOPY N/A 11/23/2015   Procedure: COLONOSCOPY;  Surgeon: Danie Binder, MD;  Location: [facility];  Service: Endoscopy;  Laterality: N/A;  1100 - moved to 10:45 - office to notify   ESOPHAGOGASTRODUODENOSCOPY N/A 11/23/2015   Procedure: ESOPHAGOGASTRODUODENOSCOPY (EGD);  Surgeon: Danie Binder, MD;  Location: [facility];  Service: Endoscopy;  Laterality: N/A;   PORT-A-CATH REMOVAL  02/2015   PORTACATH PLACEMENT Right 09/17/2014   Procedure: INSERTION PORT-A-CATH WITH ULTRASOUND;  Surgeon: Erroll Luna, MD;  Location: [facility];  Service: General;  Laterality: Right;  IJ   RADIOACTIVE SEED GUIDED PARTIAL MASTECTOMY WITH AXILLARY SENTINEL LYMPH NODE BIOPSY Left 08/20/2014   Procedure: LEFT BREAST LUMPECTOMY WITH RADIOACTIVE SEED LOCALIZATION AND SENTINEL LYMPH NODE MAPPING;  Surgeon: Erroll Luna, MD;  Location: [facility];  Service: General;  Laterality: Left;   RE-EXCISION OF BREAST LUMPECTOMY Left 09/17/2014   Procedure: RE-EXCISION OF BREAST  LUMPECTOMY;  Surgeon: Erroll Luna, MD;  Location: [facility];  Service: General;  Laterality: Left;   TOTAL HIP ARTHROPLASTY Right 02/22/2021   Procedure: RIGHT TOTAL HIP ARTHROPLASTY ANTERIOR APPROACH;  Surgeon: Marybelle Killings, MD;  Location: [facility];  Service: Orthopedics;  Laterality: Right;   TUBAL LIGATION     VIDEO BRONCHOSCOPY WITH ENDOBRONCHIAL ULTRASOUND N/A 08/23/2021   Procedure: VIDEO BRONCHOSCOPY WITH ENDOBRONCHIAL ULTRASOUND;  Surgeon: Collene Gobble, MD;  Location: [facility] END[facility]COPY;  Service: Pulmonary;  Laterality: N/A;   VIDEO BRONCHOSCOPY WITH RADIAL ENDOBRONCHIAL ULTRASOUND  08/23/2021   Procedure: VIDEO BRONCHOSCOPY WITH RADIAL ENDOBRONCHIAL ULTRASOUND;  Surgeon: Collene Gobble, MD;  Location: [facility];  Service: Pulmonary;;    SOCIAL HISTORY: Social History   Socioeconomic History   Marital status: Divorced    Spouse name: Not on file   Number of children: 2   Years of education: Not on file  Highest education level: Not on file  Occupational History   Not on file  Tobacco Use   Smoking status: Former    Packs/day: 1.00    Years: 42.00    Pack years: 42.00    Types: Cigarettes    Quit date: 02/22/2021    Years since quitting: 0.5   Smokeless tobacco: Never  Vaping Use   Vaping Use: Never used  Substance and Sexual Activity   Alcohol use: No    Alcohol/week: 0.0 standard drinks   Drug use: No   Sexual activity: Never    Birth control/protection: None  Other Topics Concern   Not on file  Social History Narrative   Not on file   Social Determinants of Health   Financial Resource Strain: Not on file  Food Insecurity: Not on file  Transportation Needs: Not on file  Physical Activity: Not on file  Stress: Not on file  Social Connections: Unknown   Frequency of Communication with Friends and Family: More than three times a week   Frequency of Social Gatherings with Friends and Family: More than three times a week   Attends Religious  Services: Not on Electrical engineer or Organizations: Not on file   Attends Archivist Meetings: Not on file   Marital Status: Not on file  Intimate Partner Violence: Not on file    FAMILY HISTORY: Family History  Problem Relation Age of Onset   Diabetes Mother    Ovarian cancer Mother 47   Other Father        died in MVA at age 81   Other Sister        mitral valve disorder   Melanoma Sister 49       removed from back of leg   Colon cancer Brother        early 17s, succumbed to disease   Cancer Paternal Aunt        leukemia, bone, or lung cancer   Alzheimer's disease Maternal Grandmother    Heart attack Maternal Grandfather    Heart attack Paternal Grandmother    COPD Paternal Grandfather    Emphysema Paternal Grandfather    Congestive Heart Failure Paternal Aunt    Stomach cancer Paternal Uncle    Kidney cancer Maternal Uncle    Cancer Cousin        dx. teens/dx. 84s   Cancer Cousin    Colon cancer Cousin     ALLERGIES:  has No Known Allergies.  MEDICATIONS:  Current Outpatient Medications  Medication Sig Dispense Refill   albuterol (PROVENTIL HFA;VENTOLIN HFA) 108 (90 BASE) MCG/ACT inhaler Inhale 2 puffs into the lungs every 6 (six) hours as needed for wheezing or shortness of breath.      ALPRAZolam (XANAX) 0.5 MG tablet Take 0.5 mg by mouth 3 (three) times daily.     ANORO ELLIPTA 62.5-25 MCG/INH AEPB Inhale 1 puff into the lungs daily.     aspirin EC 325 MG EC tablet Take 1 tablet (325 mg total) by mouth daily with breakfast. Must take at least 4 weeks postop for DVT prophylaxis (Patient not taking: Reported on 04/15/2021) 30 tablet 0   calcium carbonate (TUMS - DOSED IN MG ELEMENTAL CALCIUM) 500 MG chewable tablet Chew 2 tablets by mouth 3 (three) times daily as needed for indigestion or heartburn.     fluconazole (DIFLUCAN) 100 MG tablet Take 2 tablets today, then 1 tablet daily x 20 more days. 22 tablet 0  furosemide (LASIX) 20 MG tablet  Take 20 mg by mouth daily.     hydrochlorothiazide (HYDRODIURIL) 12.5 MG tablet Take 12.5 mg by mouth daily.     HYDROcodone-acetaminophen (NORCO) 10-325 MG tablet Take 1 tablet by mouth every 4 (four) hours as needed. 30 tablet 0   ibuprofen (ADVIL) 800 MG tablet Take 800 mg by mouth every 8 (eight) hours as needed for moderate pain.     lidocaine (XYLOCAINE) 2 % solution Patient: Mix 1part 2% viscous lidocaine, 1part H20. Swallow 62m of diluted mixture, 336m before meals and at bedtime, up to QID for sore throat. 200 mL 3   Menthol, Topical Analgesic, (BIOFREEZE EX) Apply 1 application topically daily as needed (pain).     pantoprazole (PROTONIX) 40 MG tablet Take 1 tablet by mouth once daily 90 tablet 0   potassium chloride (KLOR-CON M) 10 MEQ tablet Take 10 mEq by mouth daily. Patient stated stated that she was started on 20 meq daily in August.     sertraline (ZOLOFT) 100 MG tablet Take 100 mg by mouth daily.     No current facility-administered medications for this visit.    PHYSICAL EXAMINATION: ECOG PERFORMANCE STATUS: 0 - Asymptomatic  Vitals:   09/24/21 1323  BP: (!) 112/56  Pulse: 60  Resp: 18  Temp: 98.4 F (36.9 C)  SpO2: 98%    Filed Weights   09/24/21 1323  Weight: 213 lb 8 oz (96.8 kg)   PE deferred in lieu of counseling, pt too upset today about the prognosis, plan of treatment  LABORATORY DATA:  I have reviewed the data as listed Lab Results  Component Value Date   WBC 6.6 09/24/2021   HGB 13.0 09/24/2021   HCT 38.2 09/24/2021   MCV 88.2 09/24/2021   PLT 200 09/24/2021   Lab Results  Component Value Date   NA 142 09/24/2021   K 3.1 (L) 09/24/2021   CL 100 09/24/2021   CO2 34 (H) 09/24/2021    RADIOGRAPHIC STUDIES: I have personally reviewed the radiological reports and agreed with the findings in the report.  ASSESSMENT AND PLAN:  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[place].p definitive radiation. Complains of severe fatigue and  hoarseness from treatment She has a follow up with Dr SqIsidore Moos Squamous cell lung cancer (HCC) Cytology from left lower lobe lung nodule consistent with squamous cell carcinoma, PD-L1 pending. This was previously sent but concern was that fax may not have been delivered, resent today again. Given supraclavicular lymphadenopathy, I believe this is atleast stage IIIB lung cancer. Unfortunately she may not be a candidate for concurrent chemoradiation per my discussion with Dr. SqIsidore Moos Dr SqIsidore Moosas very concerned about her tolerance to concurrent chemoradiation and the likelihood that this may not be cured even with this modality of treatment. I agree that she is not a candidate for concurrent chemoradiation, she is very fatigued and doesn't feel well after her radiation to the head and neck cancer Once again, I tried to explain to the patient that surgery is not recommended for this stage and without concurrent chemoradiation, we may likely not obtain a cure. We could certainly start her on chemotherapy with carbotaxol for 3/4 cycles followed by radiation and adjuvant immunotherapy. Even with concurrent chemoradiation, cure rate is not very high unfortunately. In the past I have discussed about this plan in great detail but she again had the same questions which I tried to answer to the best of my knowledge. She was  very upset that she caused this to herself and she is going to be sick with all these treatment. She was even considering not doing anything because her quality of life is important to her and she doesn't want to be a burden to anyone. I recommended that she think about the options and let us know when she comes to see [place] in 2/3 weeks She currently feels very tired, hoarse since she completed the radiation and would appreciate a couple more weeks before any further treatment. She was very tearful, hard to console today  Carcinoma of upper-outer quadrant of left female breast No concern  for recurrence of breast cancer at this time.  No significant role for antiestrogen therapy since she has completed 6 years.   Total time spent: 60 minutes Most of the time was spent counseling, reviewing pathology, staging, treatment options, prognosis for the lung cancer. She was very upset today and inconsolable,  All questions were answered. The patient knows to call the clinic with any problems, questions or concerns.    Benay Pike, MD 09/25/21

## 2021-09-25 ENCOUNTER — Encounter: Payer: Self-pay | Admitting: Hematology and Oncology

## 2021-09-25 ENCOUNTER — Encounter (HOSPITAL_COMMUNITY): Payer: Self-pay | Admitting: Hematology & Oncology

## 2021-09-25 NOTE — Assessment & Plan Note (Addendum)
She is s.p definitive radiation. ?Complains of severe fatigue and hoarseness from treatment ?She has a follow up with Dr Isidore Moos. ?

## 2021-09-25 NOTE — Assessment & Plan Note (Signed)
Cytology from left lower lobe lung nodule consistent with squamous cell carcinoma, PD-L1 pending. This was previously sent but concern was that fax may not have been delivered, resent today again. ?Given supraclavicular lymphadenopathy, I believe this is atleast stage IIIB lung cancer. ?Unfortunately she may not be a candidate for concurrent chemoradiation per my discussion with Dr. Isidore Moos.  ?Dr Isidore Moos was very concerned about her tolerance to concurrent chemoradiation and the likelihood that this may not be cured even with this modality of treatment. I agree that she is not a candidate for concurrent chemoradiation, she is very fatigued and doesn't feel well after her radiation to the head and neck cancer ?Once again, I tried to explain to the patient that surgery is not recommended for this stage and without concurrent chemoradiation, we may likely not obtain a cure. We could certainly start her on chemotherapy with carbotaxol for 3/4 cycles followed by radiation and adjuvant immunotherapy. Even with concurrent chemoradiation, cure rate is not very high unfortunately. ?In the past I have discussed about this plan in great detail but she again had the same questions which I tried to answer to the best of my knowledge. ?She was very upset that she caused this to herself and she is going to be sick with all these treatment. ?She was even considering not doing anything because her quality of life is important to her and she doesn't want to be a burden to anyone. ?I recommended that she think about the options and let us know when she comes to see [place] in 2/3 weeks ?She currently feels very tired, hoarse since she completed the radiation and would appreciate a couple more weeks before any further treatment. ?She was very tearful, hard to console today ?

## 2021-09-25 NOTE — Assessment & Plan Note (Signed)
No concern for recurrence of breast cancer at this time.  No significant role for antiestrogen therapy since she has completed 6 years. ?

## 2021-09-27 ENCOUNTER — Telehealth: Payer: Self-pay | Admitting: Hematology and Oncology

## 2021-09-27 NOTE — Telephone Encounter (Signed)
Scheduled appointment per 03/03 los. Patient aware.  ?

## 2021-09-28 ENCOUNTER — Encounter: Payer: Self-pay | Admitting: Radiation Oncology

## 2021-09-28 ENCOUNTER — Ambulatory Visit
Admission: RE | Admit: 2021-09-28 | Discharge: 2021-09-28 | Disposition: A | Discharge status: Home / Self Care | Payer: Medicare HMO | Source: Ambulatory Visit | Attending: Radiation Oncology | Admitting: Radiation Oncology

## 2021-09-28 ENCOUNTER — Other Ambulatory Visit: Payer: Self-pay

## 2021-09-28 VITALS — BP 121/69 | HR 66 | Temp 97.8°F | Resp 24 | Ht 64.0 in | Wt 214.0 lb

## 2021-09-28 DIAGNOSIS — R69 Illness, unspecified: Secondary | ICD-10-CM | POA: Diagnosis not present

## 2021-09-28 DIAGNOSIS — Z853 Personal history of malignant neoplasm of breast: Secondary | ICD-10-CM | POA: Insufficient documentation

## 2021-09-28 DIAGNOSIS — F419 Anxiety disorder, unspecified: Secondary | ICD-10-CM | POA: Diagnosis not present

## 2021-09-28 DIAGNOSIS — M2540 Effusion, unspecified joint: Secondary | ICD-10-CM | POA: Diagnosis not present

## 2021-09-28 DIAGNOSIS — Z79899 Other long term (current) drug therapy: Secondary | ICD-10-CM | POA: Insufficient documentation

## 2021-09-28 DIAGNOSIS — Z923 Personal history of irradiation: Secondary | ICD-10-CM | POA: Insufficient documentation

## 2021-09-28 DIAGNOSIS — C321 Malignant neoplasm of supraglottis: Secondary | ICD-10-CM | POA: Diagnosis not present

## 2021-09-28 DIAGNOSIS — R590 Localized enlarged lymph nodes: Secondary | ICD-10-CM

## 2021-09-28 DIAGNOSIS — R490 Dysphonia: Secondary | ICD-10-CM | POA: Diagnosis not present

## 2021-09-28 DIAGNOSIS — C3490 Malignant neoplasm of unspecified part of unspecified bronchus or lung: Secondary | ICD-10-CM

## 2021-09-28 NOTE — Progress Notes (Signed)
Radiation Oncology         [phone] ________________________________  Name: Regina Mcmahon MRN: [id number]  Date: 09/28/2021  DOB: 03/07/1951  Follow-Up Visit Note  CC: [name], MD  Melida Quitter, MD  Diagnosis and Prior Radiotherapy:       ICD-10-CM   1. 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[place])  C32.1 Amb Referral to Palliative Care    2. Mediastinal adenopathy  R59.0 Amb Referral to Palliative Care    3. Squamous cell carcinoma of lung, unspecified laterality (HCC)  C34.90 Amb Referral to Palliative Care      CHIEF COMPLAINT:  Here for follow-up and surveillance of glottic/lung cancer  Narrative:  The patient returns today for routine follow-up.  Regina Mcmahon presents today for follow-up after completing radiation to her larynx on 09/13/2021  Pain issues, if any: Reports throat is manageable (currently managing with ibuprofen due to difficulty getting hydrocodone tablets filled at her pharmacy) Using a feeding tube?: N/A Weight changes, if any:  Wt Readings from Last 3 Encounters:  09/28/21 214 lb (97.1 kg)  09/24/21 213 lb 8 oz (96.8 kg)  08/30/21 217 lb (98.4 kg)   Swallowing issues, if any: Reports she is slowly eating and tolerating more solid foods (ex. Fried chicken, meat stew, etc) Smoking or chewing tobacco? None Using fluoride trays daily? N/A Last ENT visit was on: Not since diagnosis Other notable issues, if any: Continues to deal with vocal hoarseness. Skin in treatment field looks improved, but there does remain an area of redness to her neck fold. Reports she is working on moving to a Psychologist, clinical (which she's thankful for but it has been a stressful process to manage on top of all her other appointments).   She is very tearful today, feeling guilt and anxiety.  Sees Dr Chryl Heck March 29th.                      ALLERGIES:  has No Known Allergies.  Meds: Current Outpatient Medications  Medication Sig Dispense Refill   albuterol (PROVENTIL  HFA;VENTOLIN HFA) 108 (90 BASE) MCG/ACT inhaler Inhale 2 puffs into the lungs every 6 (six) hours as needed for wheezing or shortness of breath.      ALPRAZolam (XANAX) 0.5 MG tablet Take 0.5 mg by mouth 3 (three) times daily.     ANORO ELLIPTA 62.5-25 MCG/INH AEPB Inhale 1 puff into the lungs daily.     aspirin EC 325 MG EC tablet Take 1 tablet (325 mg total) by mouth daily with breakfast. Must take at least 4 weeks postop for DVT prophylaxis (Patient not taking: Reported on 04/15/2021) 30 tablet 0   calcium carbonate (TUMS - DOSED IN MG ELEMENTAL CALCIUM) 500 MG chewable tablet Chew 2 tablets by mouth 3 (three) times daily as needed for indigestion or heartburn.     fluconazole (DIFLUCAN) 100 MG tablet Take 2 tablets today, then 1 tablet daily x 20 more days. 22 tablet 0   furosemide (LASIX) 20 MG tablet Take 20 mg by mouth daily.     hydrochlorothiazide (HYDRODIURIL) 12.5 MG tablet Take 12.5 mg by mouth daily.     HYDROcodone-acetaminophen (NORCO) 10-325 MG tablet Take 1 tablet by mouth every 4 (four) hours as needed. 30 tablet 0   ibuprofen (ADVIL) 800 MG tablet Take 800 mg by mouth every 8 (eight) hours as needed for moderate pain.     lidocaine (XYLOCAINE) 2 % solution Patient: Mix 1part 2% viscous lidocaine, 1part  H20. Swallow 62m of diluted mixture, 314m before meals and at bedtime, up to QID for sore throat. 200 mL 3   Menthol, Topical Analgesic, (BIOFREEZE EX) Apply 1 application topically daily as needed (pain).     pantoprazole (PROTONIX) 40 MG tablet Take 1 tablet by mouth once daily 90 tablet 0   potassium chloride (KLOR-CON M) 10 MEQ tablet Take 10 mEq by mouth daily. Patient stated stated that she was started on 20 meq daily in August.     sertraline (ZOLOFT) 100 MG tablet Take 100 mg by mouth daily.     No current facility-administered medications for this encounter.    Physical Findings: The patient is in no acute distress. Patient is alert and oriented. Wt Readings from Last  3 Encounters:  09/28/21 214 lb (97.1 kg)  09/24/21 213 lb 8 oz (96.8 kg)  08/30/21 217 lb (98.4 kg)    height is '5\' 4"'  (1.626 m) and weight is 214 lb (97.1 kg). Her temperature is 97.8 F (36.6 C). Her blood pressure is 121/69 and her pulse is 66. Her respiration is 24 (abnormal) and oxygen saturation is 97%. .  General: Alert and oriented, in no acute distress HEENT: Hoarse. Head is normocephalic. Extraocular movements are intact. Oropharynx is notable for no lesions Neck: Neck is notable for without palpable masses Skin: Skin in treatment fields shows satisfactory healing with some residual redness over right neck.   Abd: skin is regenerating in skill folds of abdomen, no obvious yeast infection currently. Heart: Regular in rate and rhythm with no murmurs, rubs, or gallops. Chest: Clear to auscultation bilaterally, with no rhonchi, wheezes, or rales. Psychiatric: Judgment and insight are intact. Affect is appropriate.   Lab Findings: Lab Results  Component Value Date   WBC 6.6 09/24/2021   HGB 13.0 09/24/2021   HCT 38.2 09/24/2021   MCV 88.2 09/24/2021   PLT 200 09/24/2021    Lab Results  Component Value Date   TSH 1.004 08/10/2021    Radiographic Findings: MR Brain W Wo Contrast  Result Date: 09/04/2021 CLINICAL DATA:  [age]ear old female with history of glottic carcinoma, breast cancer, non-small cell lung cancer. Staging. EXAM: MRI HEAD WITHOUT AND WITH CONTRAST TECHNIQUE: Multiplanar, multiecho pulse sequences of the brain and surrounding structures were obtained without and with intravenous contrast. CONTRAST:  1080mADAVIST GADOBUTROL 1 MMOL/ML IV SOLN COMPARISON:  PET-CT 07/23/2021. FINDINGS: Brain: Cerebral volume is within normal limits for age. No restricted diffusion to suggest acute infarction. No midline shift, mass effect, evidence of mass lesion, ventriculomegaly, extra-axial collection or acute intracranial hemorrhage. Cervicomedullary junction and pituitary are  within normal limits. No abnormal enhancement identified. No pachymeningeal thickening identified. A subtle area of prominent leptomeninges along the medial right frontal lobe, right cingulate gyrus (series 1100, image 219 and series 9, image 25) is not associated with cerebral edema or signal abnormality on the remaining sequences and felt to be a normal variant. Patchy and confluent bilateral cerebral white matter T2 and FLAIR hyperintensity is moderately advanced for age, in a nonspecific configuration. No cortical encephalomalacia identified. No chronic cerebral blood products. Deep gray matter nuclei and cerebellum are within normal limits. Mild patchy T2 and FLAIR hyperintensity in the pons. Vascular: Major intracranial vascular flow voids are preserved. There is mild generalized intracranial artery tortuosity. The distal left vertebral artery appears to be dominant (normal variant). Skull and upper cervical spine: Negative visible cervical spine. Background bone marrow signal is mildly heterogeneous but generally normal. There is a  subtle 12 mm area of decreased T1 marrow intensity in the left calvarium vertex series 2, image 28), which is subtle on the remaining sequences but indeterminate, with slightly conspicuous diffusion signal (series 3, image 44). No other suspicious bone lesion identified. Small volume degenerative right TMJ joint effusion. Sinuses/Orbits: Negative orbits. No significant paranasal sinus mucosal thickening, no sinus fluid levels. Other: Visible internal auditory structures appear normal. Negative visible scalp and face. IMPRESSION: 1. No cerebral metastatic disease identified. 2. Indeterminate 12 mm left calvarium bone lesion. Small skull metastasis not excluded. Attention directed on follow-up. 3. No acute intracranial abnormality. Moderate for age signal changes in the brain, most commonly due to chronic small vessel disease. Electronically Signed   By: Genevie Ann M.D.   On: 09/04/2021  10:54    Impression/Plan:    1) Head and Neck Cancer Status: healing from RT - try resting voice to help hoarseness  2) Nutritional Status: doing well Wt Readings from Last 3 Encounters:  09/28/21 214 lb (97.1 kg)  09/24/21 213 lb 8 oz (96.8 kg)  08/30/21 217 lb (98.4 kg)    PEG tube: none  3) Risk Factors: The patient has been educated about risk factors including alcohol and tobacco abuse; they understand that avoidance of alcohol and tobacco is important to prevent recurrences as well as other cancers - abstains from smoking  4) Swallowing: good function  5)  Thyroid function: check annually Lab Results  Component Value Date   TSH 1.004 08/10/2021    6) Other:  emotional wellbeing - I counseled her for a length period on coping with rumination, guilt, anxiety, fear, uncertainty.  She will reach out to SW for regular counseling referral.    7) Palliative: We will refer to palliative as well for more support w/ emotional needs AND symptom management  8) Contacted team re: expediting PDL1 test results.  I will see her March 29th with tentative CT simulation in case Dr. Chryl Heck and I agree to try ChRT.  Drug options are still unclear without lab results.  I encouraged to call with any issues or questions before then. Sister present and supportive  On date of service, in total, I spent 55 minutes on this encounter. Patient was seen in person. _____________________________________   Eppie Gibson, MD

## 2021-09-28 NOTE — Progress Notes (Signed)
Regina Mcmahon presents today for follow-up after completing radiation to her larynx on 09/13/2021 ? ?Pain issues, if any: Reports throat is manageable (currently managing with ibuprofen due to difficulty getting hydrocodone tablets filled at her pharmacy) ?Using a feeding tube?: N/A ?Weight changes, if any:  ?Wt Readings from Last 3 Encounters:  ?09/28/21 214 lb (97.1 kg)  ?09/24/21 213 lb 8 oz (96.8 kg)  ?08/30/21 217 lb (98.4 kg)  ? ?Swallowing issues, if any: Reports she is slowly eating and tolerating more solid foods (ex. Fried chicken, meat stew, etc) ?Smoking or chewing tobacco? None ?Using fluoride trays daily? N/A ?Last ENT visit was on: Not since diagnosis ?Other notable issues, if any: Continues to deal with vocal hoarseness. Skin in treatment field looks improved, but there does remain an area of redness to her neck fold. Reports she is working on moving to a Psychologist, clinical (which she's thankful for but it has been a stressful process to manage on top of all her other appointments).  ? ? ? ? ?

## 2021-09-29 ENCOUNTER — Telehealth: Payer: Self-pay | Admitting: Radiation Oncology

## 2021-09-29 ENCOUNTER — Encounter (HOSPITAL_COMMUNITY): Payer: Self-pay | Admitting: Hematology & Oncology

## 2021-09-29 NOTE — Telephone Encounter (Signed)
LVM to schedule RECON with Dr. Isidore Moos.  ?

## 2021-09-29 NOTE — Progress Notes (Signed)
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 [facility]) Staging form: Breast, AJCC 7th Edition - Clinical stage from 07/01/2014: Stage IIA (T2, N0, M0) - Unsigned Staged by: Managing physician Diagnostic confirmation: Positive histology Laterality: Left Tumor size (mm): 2.6 Method of detection of distant metastases: Clinical Histologic grade (G): G2 Histologic grading system: 3 grade system Lymph-vascular invasion (LVI): LVI present/identified, NOS Residual tumor (R): R0 - None Paget's disease: Negative Estrogen receptor status: Positive Estrogen receptor test method: IHC Intensity of estrogen receptor staining: Strong Percentage of positive estrogen receptors (%): 100 Progesterone receptor status: Positive Progesterone receptor test method: IHC Intensity of progesterone receptor staining: Strong Percentage of positive progesterone receptors (%): 91 HER2 status: Negative 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 IHC of regional lymph nodes: Positive Results for molecular studies of regional lymph nodes: Not assessed Circulating tumor cells (CTC): Not assessed Disseminated tumor cells: Not assessed Stage used in treatment planning: Yes National guidelines used in treatment planning: Yes Type of national guideline used in treatment planning: NCCN - Pathologic stage from 08/20/2014: Stage IIB (T2, N1a, cM0) - Unsigned  Malignant neoplasm of  supraglottis ([place]) Staging form: Larynx - Supraglottis, AJCC 8th Edition - Clinical stage from 07/30/2021: cT1, cN1 - Unsigned  Squamous cell lung cancer ([place]) Staging form: Lung, AJCC 8th Edition - Clinical stage from 09/08/2021: Stage IIIB (cT1b, cN3, cM0) - Signed by Eppie Gibson, MD on 09/08/2021 Stage prefix: Initial diagnosis    Intent: Curative  Radiation Treatment Dates: 08/16/2021 through 09/13/2021 Site Technique Total Dose (Gy) Dose per Fx (Gy) Completed Fx Beam Energies  Larynx: HN_Larynx IMRT 54/54 2.7 20/20 6X   Narrative: The patient tolerated radiation therapy relatively well for her head and neck cancer.   Plan: The patient will follow-up with radiation oncology in 2-3 wks. -----------------------------------  Eppie Gibson, MD

## 2021-09-30 ENCOUNTER — Encounter (HOSPITAL_COMMUNITY): Payer: Self-pay

## 2021-10-01 ENCOUNTER — Encounter: Payer: Self-pay | Admitting: General Practice

## 2021-10-01 NOTE — Progress Notes (Signed)
[facility]ual Care Note ? ?Continuing to follow Regina Mcmahon for spiritual and emotional support, but her voice is currently limiting her ability to connect by phone. We plan to keep in touch as she is more able to speak. ? ? ?Chaplain Lorrin Jackson, MDiv, [facility] ?Pager [phone] ?Voicemail [phone]  ?

## 2021-10-08 ENCOUNTER — Telehealth: Payer: Self-pay

## 2021-10-08 NOTE — Telephone Encounter (Signed)
Our office was consulted to see [name] for additional support. I called [name] to explain our role in helping her with symptom management and meeting her goals of care. She was appreciative of our support and in agreeance to see our office. We will schedule her on March 29th in conjunction with her other appts. Understanding verbalized. Advised to call our office back with any questions/concerns.  ?

## 2021-10-13 ENCOUNTER — Other Ambulatory Visit: Payer: Self-pay

## 2021-10-13 ENCOUNTER — Telehealth: Payer: Self-pay

## 2021-10-13 DIAGNOSIS — C321 Malignant neoplasm of supraglottis: Secondary | ICD-10-CM

## 2021-10-13 DIAGNOSIS — R590 Localized enlarged lymph nodes: Secondary | ICD-10-CM

## 2021-10-13 DIAGNOSIS — C3490 Malignant neoplasm of unspecified part of unspecified bronchus or lung: Secondary | ICD-10-CM

## 2021-10-13 NOTE — Telephone Encounter (Signed)
Called and spoke with patient to ensure she was aware of CT scans next Monday 3/27 and the reason for the CT simulation appointment after her visit with Dr. Isidore Moos on 3/29. Patient verbalized understanding and appreciation of call. She knows she can contact me directly if she has any questions or concerns before her reconsult appointment next Wednesday.  ? ?Other providers also meeting with patient on 3/29 updated on plan via in-basket ?

## 2021-10-18 ENCOUNTER — Ambulatory Visit (HOSPITAL_COMMUNITY)
Admission: RE | Admit: 2021-10-18 | Discharge: 2021-10-18 | Disposition: A | Discharge status: Home / Self Care | Payer: Medicare HMO | Source: Ambulatory Visit | Attending: Radiation Oncology | Admitting: Radiation Oncology

## 2021-10-18 ENCOUNTER — Other Ambulatory Visit: Payer: Self-pay

## 2021-10-18 DIAGNOSIS — C3490 Malignant neoplasm of unspecified part of unspecified bronchus or lung: Secondary | ICD-10-CM

## 2021-10-18 DIAGNOSIS — I700 Atherosclerosis of aorta: Secondary | ICD-10-CM | POA: Diagnosis not present

## 2021-10-18 DIAGNOSIS — J439 Emphysema, unspecified: Secondary | ICD-10-CM | POA: Diagnosis not present

## 2021-10-18 DIAGNOSIS — R590 Localized enlarged lymph nodes: Secondary | ICD-10-CM

## 2021-10-18 DIAGNOSIS — R918 Other nonspecific abnormal finding of lung field: Secondary | ICD-10-CM | POA: Diagnosis not present

## 2021-10-18 DIAGNOSIS — J387 Other diseases of larynx: Secondary | ICD-10-CM | POA: Diagnosis not present

## 2021-10-18 DIAGNOSIS — I6529 Occlusion and stenosis of unspecified carotid artery: Secondary | ICD-10-CM | POA: Diagnosis not present

## 2021-10-18 DIAGNOSIS — C760 Malignant neoplasm of head, face and neck: Secondary | ICD-10-CM | POA: Diagnosis not present

## 2021-10-18 DIAGNOSIS — C321 Malignant neoplasm of supraglottis: Secondary | ICD-10-CM

## 2021-10-18 DIAGNOSIS — E041 Nontoxic single thyroid nodule: Secondary | ICD-10-CM | POA: Diagnosis not present

## 2021-10-18 DIAGNOSIS — R599 Enlarged lymph nodes, unspecified: Secondary | ICD-10-CM | POA: Diagnosis not present

## 2021-10-18 IMAGING — CT CT CHEST W/ CM
2 of 4 series · 13 of 36 positions shown, 16 images · IV contrast (agent unspecified)
Comparison: Chest CT [DATE].  PET-CT [DATE].

CLINICAL DATA: 70-year-old male with history of head neck cancer.
Follow-up study.

EXAM:
CT CHEST WITH CONTRAST
TECHNIQUE: Multidetector CT imaging of the chest was performed during
intravenous contrast administration.

[Series 4: coronal · coronal · 0.59mm/px · 3 of 154 slices shown]
[im 31/154  lung]
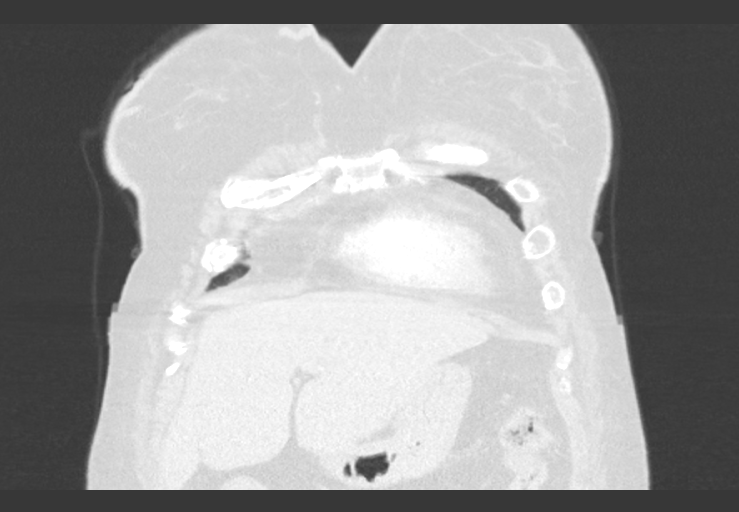
[im 62/154  lung]
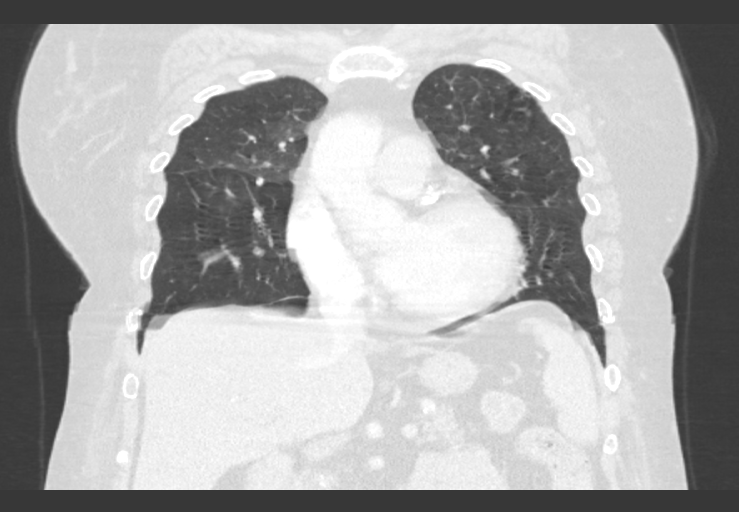
[im 92/154  lung]
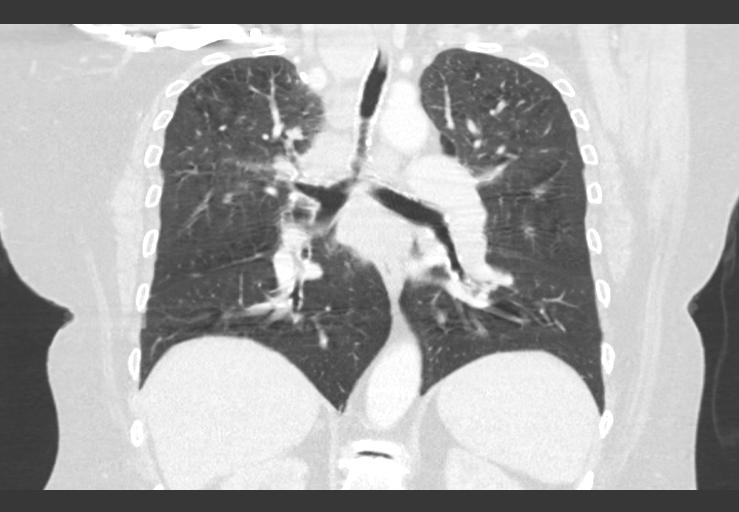

[Series 6: lungs · axial · 0.75mm/px · z∈[-451,-211]mm · 10 of 136 slices shown, 13 images]
[im 8/136  mediastinal]
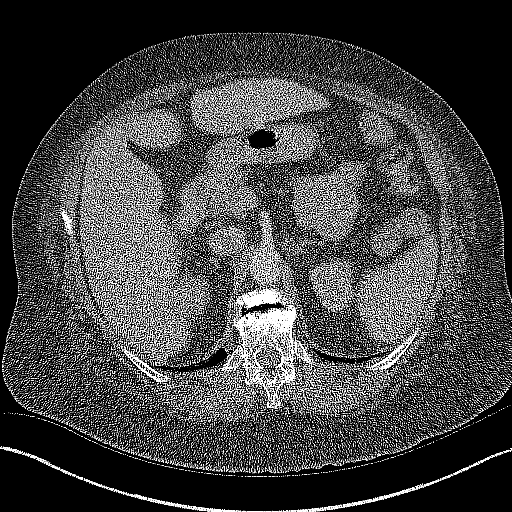
[im 8/136  lung]
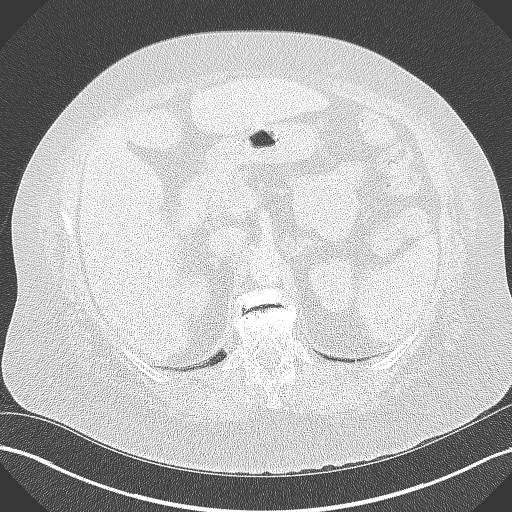
[im 22/136  lung]
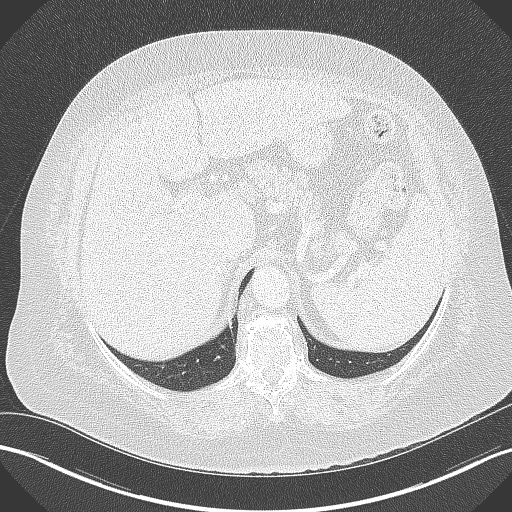
[im 36/136  lung]
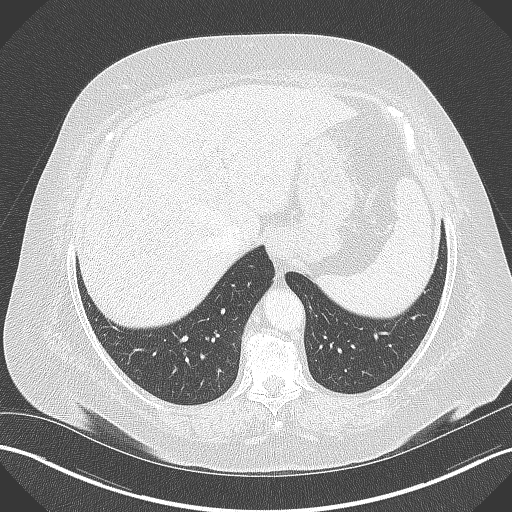
[im 50/136  lung]
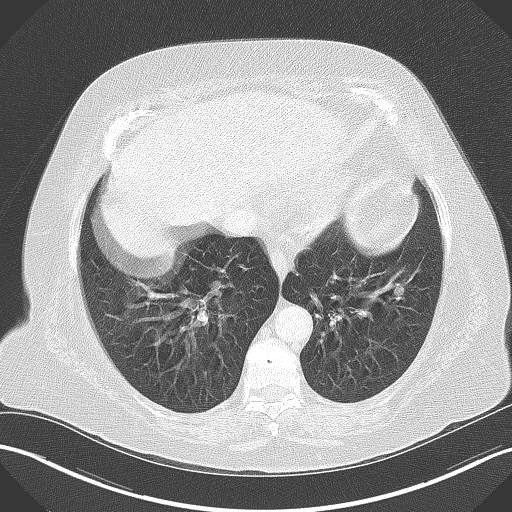
[im 64/136  mediastinal]
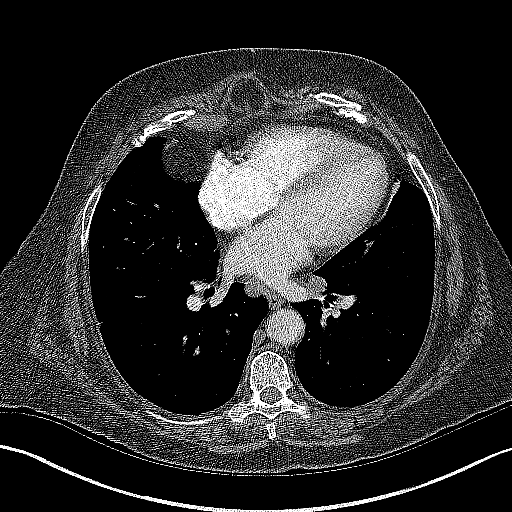
[im 64/136  lung]
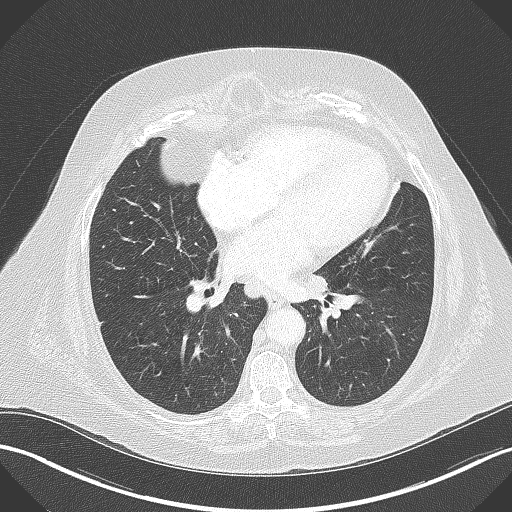
[im 72/136  lung]
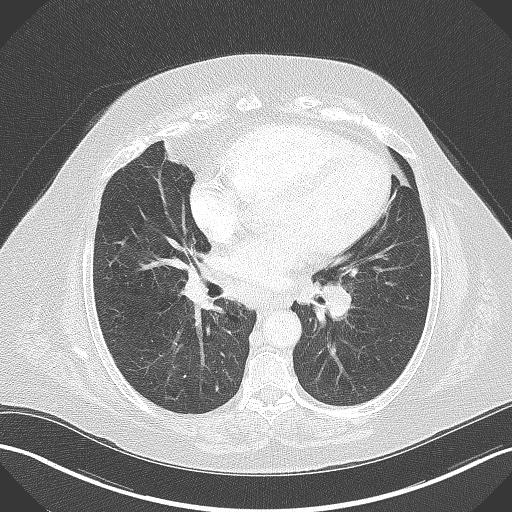
[im 86/136  lung]
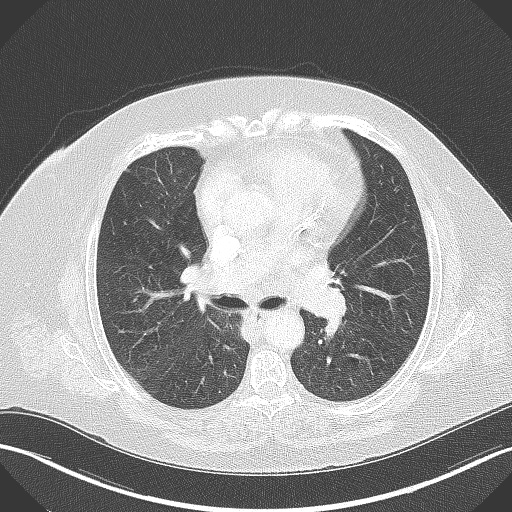
[im 100/136  lung]
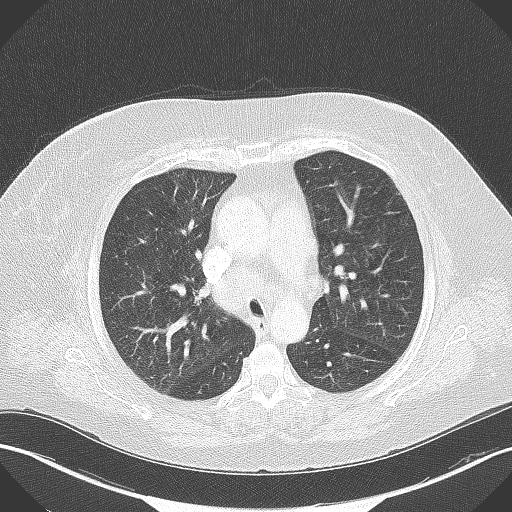
[im 114/136  mediastinal]
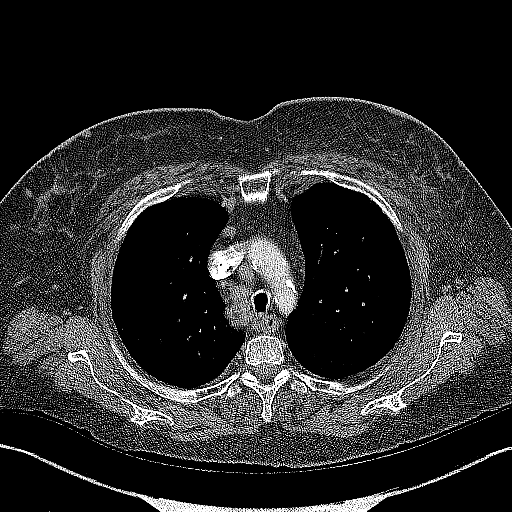
[im 114/136  lung]
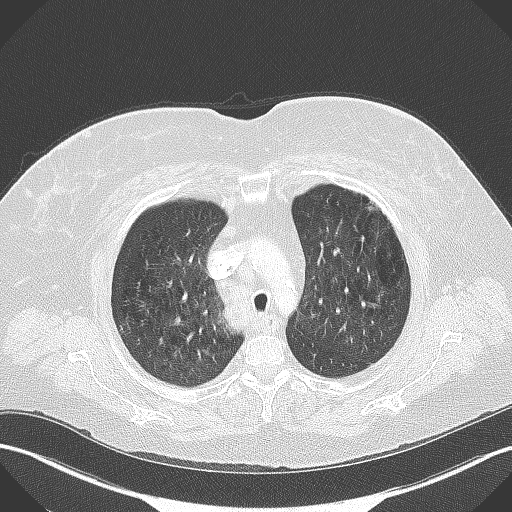
[im 128/136  lung]
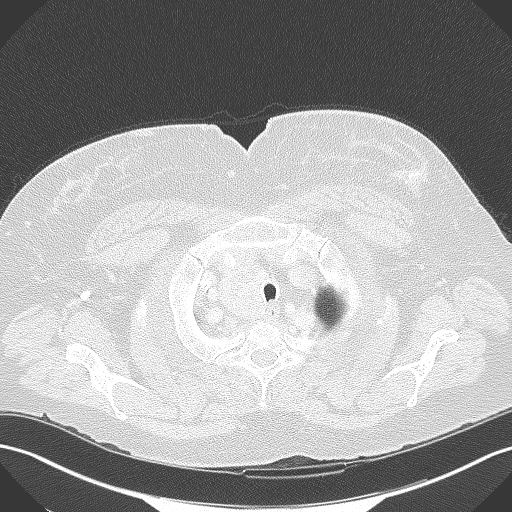

[13 of 36 positions shown; findings below may reference images not displayed]

RADIATION DOSE REDUCTION: This exam was performed according to the
departmental dose-optimization program which includes automated
exposure control, adjustment of the mA and/or kV according to
patient size and/or use of iterative reconstruction technique.

CONTRAST:  80mL OMNIPAQUE IOHEXOL 300 MG/ML  SOLN
FINDINGS: Cardiovascular: Heart size is normal. There is no significant
pericardial fluid, thickening or pericardial calcification. There is
aortic atherosclerosis, as well as atherosclerosis of the great
vessels of the mediastinum and the coronary arteries, including
calcified atherosclerotic plaque in the left main, left anterior
descending and right coronary arteries. Calcifications of the aortic
valve and mitral annulus

Mediastinum/Nodes: There are multiple enlarged mediastinal lymph
nodes, including a nodal conglomerate in the subcarinal nodal
station measuring 2.1 x 3.4 cm (axial image 24 of series 3) and
multiple other enlarged lymph nodes measuring up to 2.0 cm in the
right paratracheal nodal stations (axial images 15 and 16 of series
3). Esophagus is unremarkable in appearance. No axillary
lymphadenopathy. Asymmetric mass-like enlargement and heterogeneity
in the right lobe of the thyroid gland which measures 4.1 x 3.2 cm.

Lungs/Pleura: Today's study is limited by considerable patient
respiratory motion. With these limitations in mind, the previously
noted left lower lobe pulmonary nodule (axial image 85 of series 6)
currently measures 1.1 x 1.0 cm (previously 1.1 x 0.7 cm). A few
other scattered smaller pulmonary nodules are also noted elsewhere
in the lungs, similar to the prior study but partially obscured by
motion. No confluent consolidative airspace disease. No pleural
effusions. Diffuse bronchial wall thickening with mild centrilobular
and paraseptal emphysema.

Upper Abdomen: Status post cholecystectomy.

Musculoskeletal: There are no aggressive appearing lytic or blastic
lesions noted in the visualized portions of the skeleton.
IMPRESSION: 1. Slight interval enlargement of left lower lobe pulmonary nodule
which currently measures 1.1 x 1.0 cm.
2. Extensive mediastinal lymphadenopathy appears very similar to the
prior examination, as detailed above.
3. Asymmetrically enlarged right lobe of thyroid gland, similar to
the prior study, previously evaluated by thyroid ultrasound.
Correlation with results of fine-needle aspiration is recommended.
4. Aortic atherosclerosis, in addition to left main and 2 vessel
coronary artery disease. Please note that although the presence of
coronary artery calcium documents the presence of coronary artery
disease, the severity of this disease and any potential stenosis
cannot be assessed on this non-gated CT examination. Assessment for
potential risk factor modification, dietary therapy or pharmacologic
therapy may be warranted, if clinically indicated.
5. There are calcifications of the aortic valve and mitral annulus.
Echocardiographic correlation for evaluation of potential valvular
dysfunction may be warranted if clinically indicated.
6. Mild diffuse bronchial wall thickening with mild centrilobular
and paraseptal emphysema; imaging findings suggestive of underlying
COPD

Aortic Atherosclerosis ([5T]-[5T]) and Emphysema ([5T]-[5T]).

## 2021-10-18 IMAGING — CT CT NECK W/ CM
5 of 6 series · 14 of 33 positions shown, 16 images · IV contrast (agent unspecified)
Comparison: PET CT [DATE].  Neck CT [DATE]

CLINICAL DATA: Head neck cancer monitoring

EXAM:
CT NECK WITH CONTRAST
TECHNIQUE: Multidetector CT imaging of the neck was performed using the
standard protocol following the bolus administration of intravenous
contrast.

[Series 2: axial neck · axial · 0.59mm/px · z∈[-202,-134]mm · 2 of 104 slices shown, 3 images]
[im 35/104  soft-tissue]
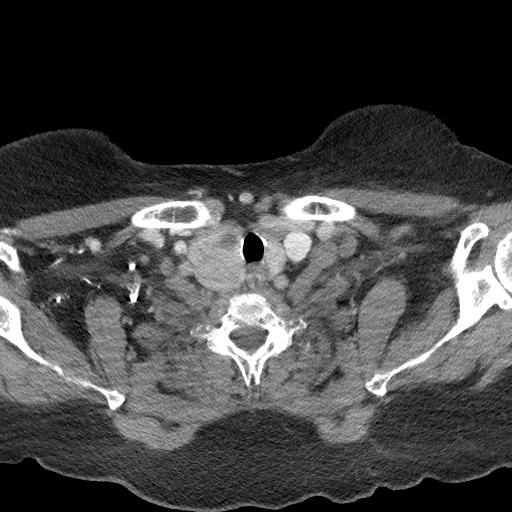
[im 35/104  bone]
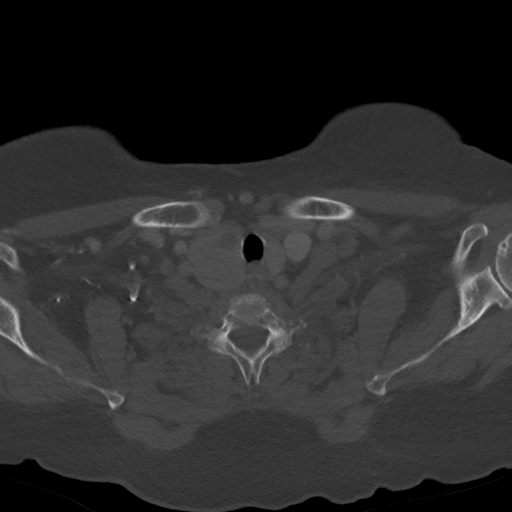
[im 69/104  bone]
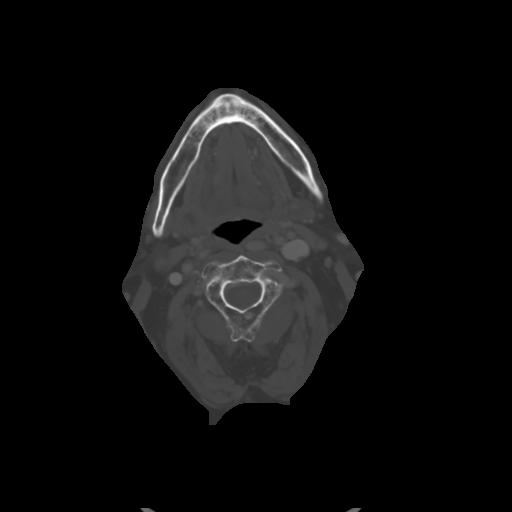

[Series 4: axial bone · axial · 0.59mm/px · z∈[-202,-134]mm · 2 of 104 slices shown]
[im 35/104  bone]
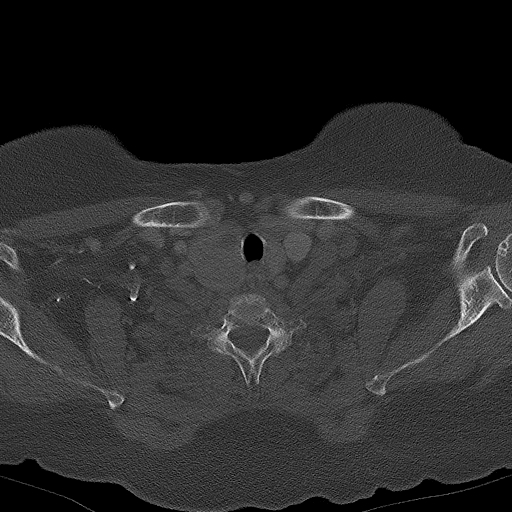
[im 69/104  bone]
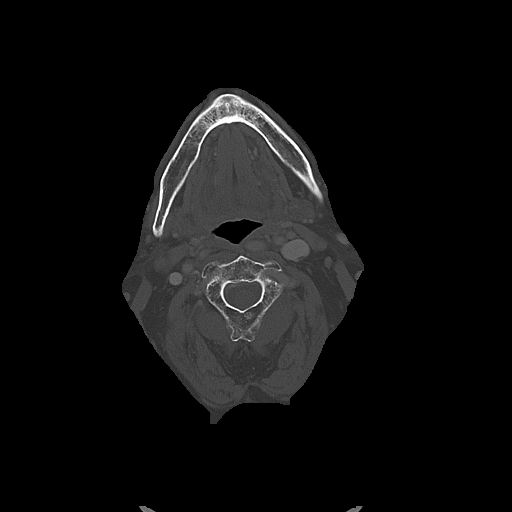

[Series 5: orthogonal (person_name) · axial · 0.52mm/px · z∈[-256,-187]mm · 2 of 111 slices shown]
[im 37/111  bone]
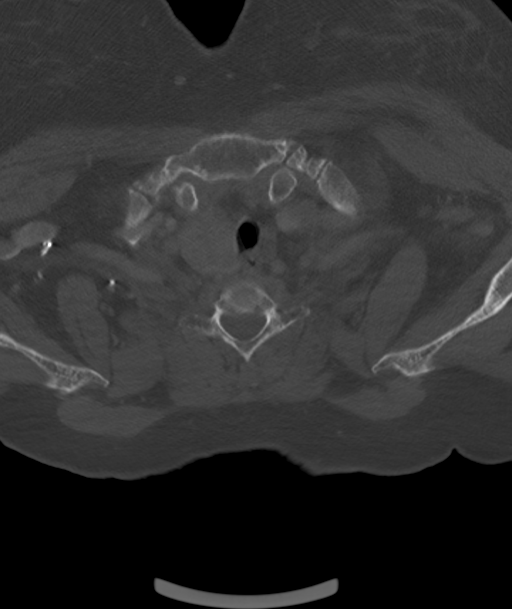
[im 74/111  bone]
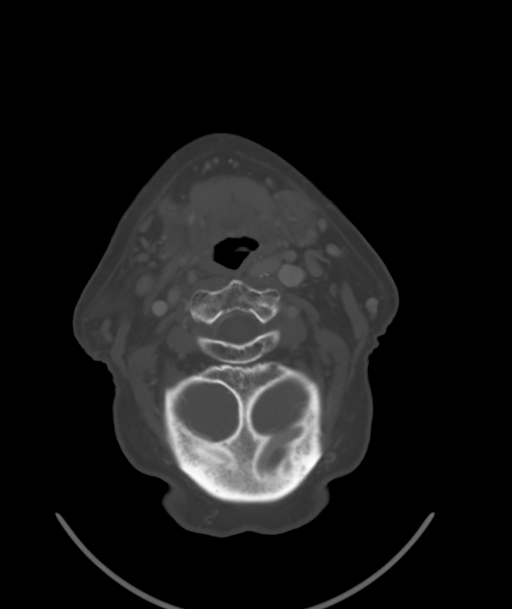

[Series 6: cor neck · coronal · 0.50mm/px · 3 of 127 slices shown]
[im 43/127  bone]
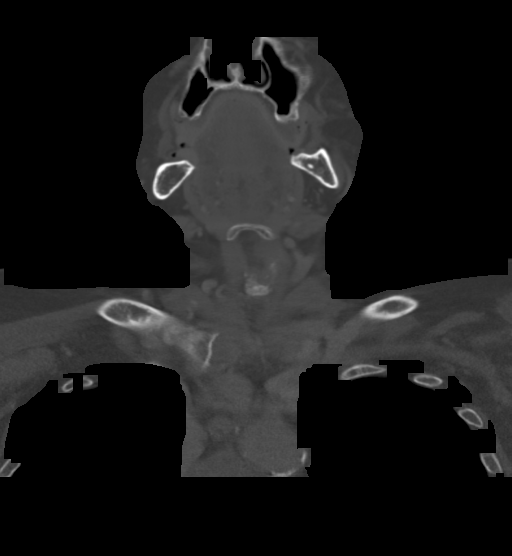
[im 57/127  bone]
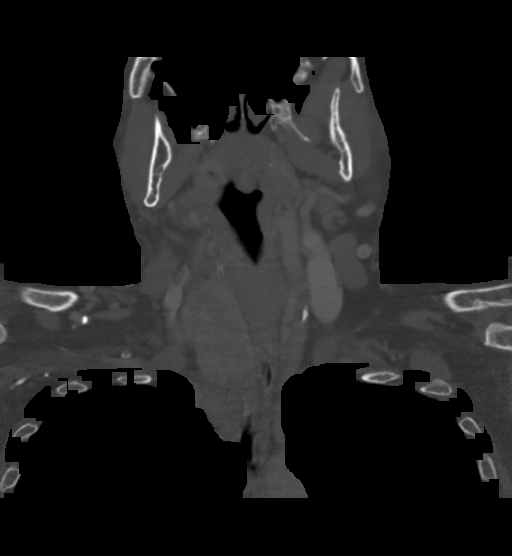
[im 71/127  bone]
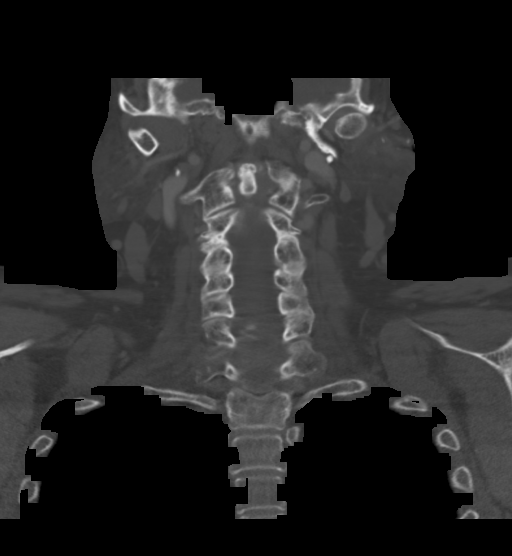

[Series 7: sag neck · sagittal · 0.46mm/px · 5 of 97 slices shown, 6 images]
[im 33/97  bone]
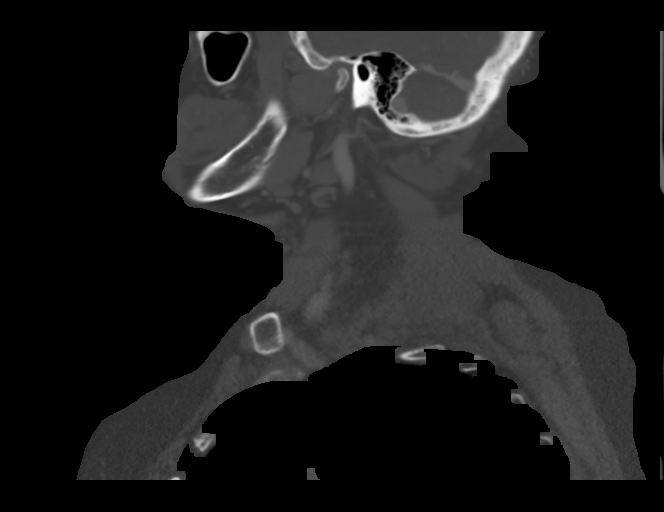
[im 41/97  bone]
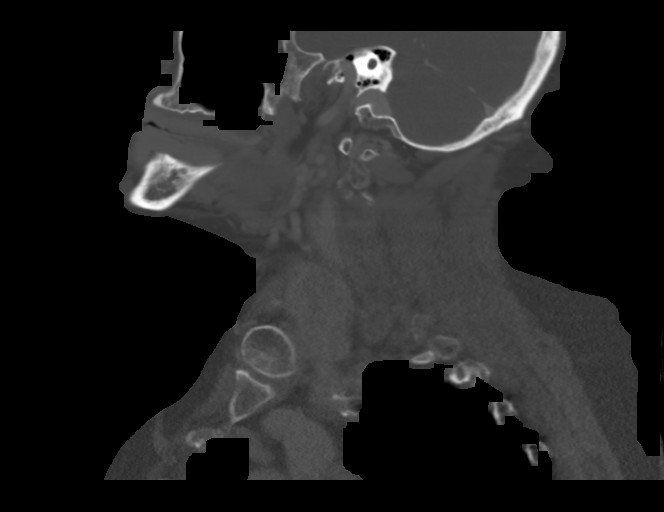
[im 49/97  soft-tissue]
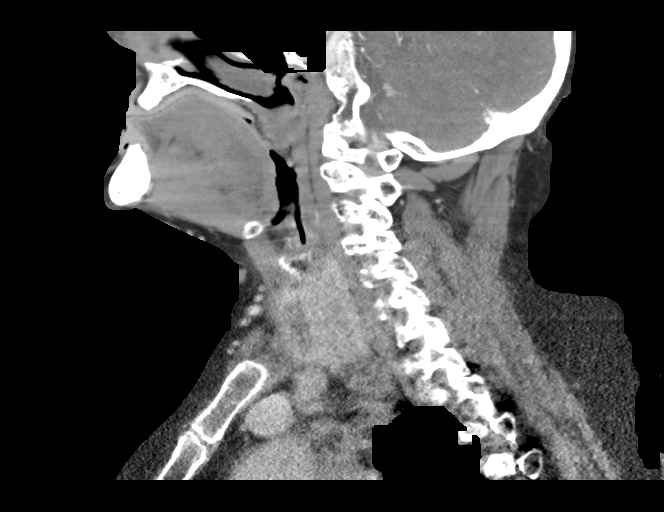
[im 49/97  bone]
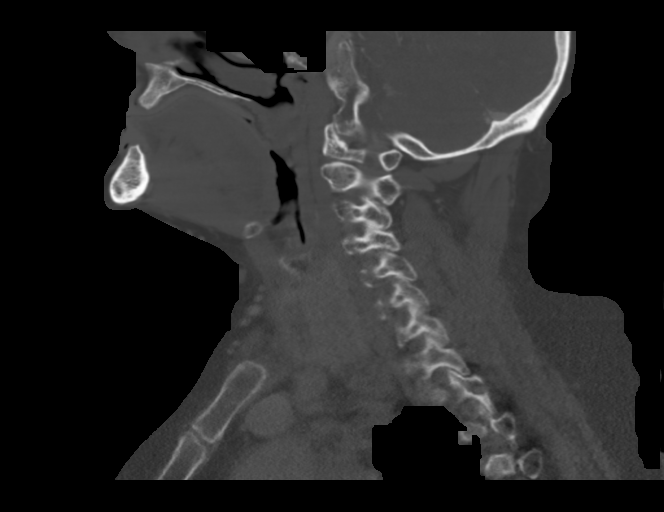
[im 57/97  bone]
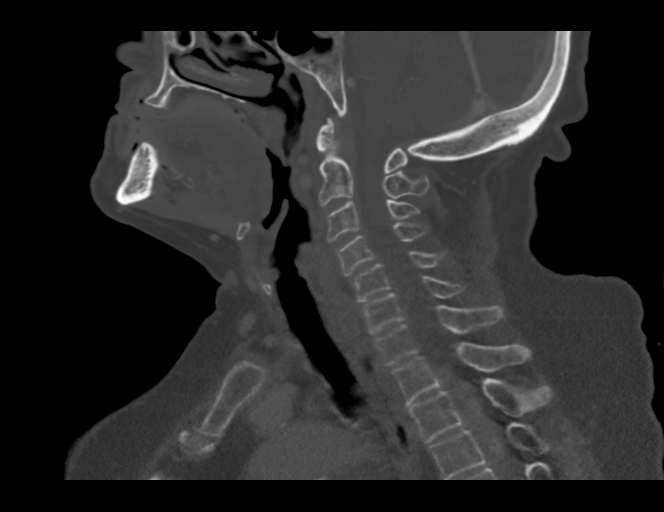
[im 65/97  bone]
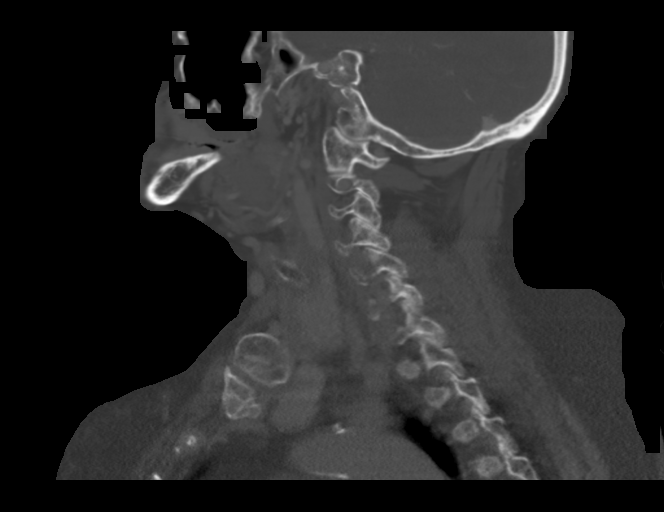

[14 of 33 positions shown; findings below may reference images not displayed]

RADIATION DOSE REDUCTION: This exam was performed according to the
departmental dose-optimization program which includes automated
exposure control, adjustment of the mA and/or kV according to
patient size and/or use of iterative reconstruction technique.

CONTRAST:  80mL OMNIPAQUE IOHEXOL 300 MG/ML  SOLN
FINDINGS: Pharynx and larynx: Exophytic lesion seen on prior at the right
glottis is no longer identified. There is a nodular area of
accentuated enhancement at the anterior supraglottic larynx, 9 x 5
mm. No submucosal nodularity.

Salivary glands: Unremarkable

Thyroid: 3.5 cm right thyroid nodule, unchanged in without invasive
features. Patient is undergoing active surveillance.

Lymph nodes: Both jugulodigastric nodes measure larger but retain
normal morphology with fatty hila and are at the upper range of
normal length (15 mm). Suspect this is reactive enlargement given
the symmetry, only the right side was borderline warm on prior PET.
No heterogeneity of nodes in the neck

Vascular: Carotid atheromatous calcifications.

Limited intracranial: Negative

Visualized orbits: Not covered

Mastoids and visualized paranasal sinuses: Clear

Skeleton: No acute or aggressive finding.

Upper chest: Reported separately.  Upper mediastinal adenopathy.
IMPRESSION: 1. Resolved polypoid lesion of the larynx. Mucosal prominence along
the midline supraglottic larynx is likely stable and non tumoral,
this should be apparent on mucosal exam.
2. No worrisome nodes in the neck.
3. Mediastinal adenopathy, reference dedicated chest CT.

## 2021-10-18 MED ORDER — IOHEXOL 300 MG/ML  SOLN
80.0000 mL | Freq: Once | INTRAMUSCULAR | Status: AC | PRN
  Administered 2021-10-18: 80 mL via INTRAVENOUS

## 2021-10-19 NOTE — Progress Notes (Signed)
Radiation Oncology         [phone] ________________________________  Outpatient Re-consultation  Name: Regina Mcmahon MRN: [id number]  Date: 10/20/2021  DOB: 1951/01/11  NW:GNFA, [name], MD  [name], MD   REFERRING PHYSICIAN: [name], MD  DIAGNOSIS: No diagnosis found.  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 [facility]) Staging form: Breast, AJCC 7th Edition - Clinical stage from 07/01/2014: Stage IIA (T2, N0, M0) - Unsigned - Pathologic stage from 08/20/2014: Stage IIB (T2, N1a, cM0) - Unsigned  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HCC) Staging form: Larynx - Supraglottis, AJCC 8th Edition - Clinical stage from 07/30/2021: cT1, cN1 - Unsigned  Squamous cell lung cancer (HCC) Staging form: Lung, AJCC 8th Edition - Clinical stage from 09/08/2021: Stage IIIB (cT1b, cN3, cM0) - Signed by Lonie Peak, MD on 09/08/2021  Interval since last radiation treatment: 1 month and 9 days   Intent: Curative  Radiation Treatment Dates: 08/16/2021 through 09/13/2021 Site Technique Total Dose (Gy) Dose per Fx (Gy) Completed Fx Beam Energies  Larynx: HN_Larynx IMRT 54/54 2.7 20/20 6X    CHIEF COMPLAINT: Here to discuss management of glottic/lung cancer and for CT simulation   Narrative / Interval History ::Regina Mcmahon is a 71 y.o. female who returns today for tentative CT simulation and re-evaluation. I last met with the patient for post-RT follow up on 09/28/21. To review from her last visit, the patient continued to have issues with vocal hoarseness and redness in her neck fold. She was also struggling with anxiety during our last visit, for which I referred her to social work for a counseling referral, as well as palliative care management for emotional needs, as well as symptom management.   PDL1 testing (which was pending at the time of our last visit) showed a TPS score of 29%.   Pertinent imaging performed since the  patient was last seen includes a chest and neck CT performed yesterday.  I personally reviewed her imaging with her.  The CT of her neck shows improvement in the larynx with no evidence of active disease and no adenopathy throughout the bilateral cervical neck.  The disease in her chest is relatively stable with mild progression of the pulmonary nodule in the left lower lung and persistent adenopathy regionally.  She is still quite hoarse.  Otherwise she is managing well after radiation therapy to the neck.  She has a chronic dry cough.  She has some trouble swallowing medications but otherwise is eating and drinking well.  Her skin has healed beautifully over her neck.  She sees medical oncology later today.   PAST MEDICAL HISTORY:  has a past medical history of Arthritis, Asthma, Asymptomatic varicose veins, Breast cancer (HCC) (06/2014), Cancer [facility]), Chronic airway obstruction (HCC), Complication of anesthesia (2016), COPD (chronic obstructive pulmonary disease) (HCC), Depression, GERD (gastroesophageal reflux disease), Hemorrhage rectum, Hypertension, Insomnia, Other symptoms involving digestive system(787.99), Pain, Palpitations, Panic attacks, Personal history of chemotherapy (2016), Personal history of radiation therapy (2016), Pneumonia, Pre-diabetes, Sinusitis, Symptomatic menopausal or female climacteric states, and Varicose veins of both lower extremities with pain.    PAST SURGICAL HISTORY: Past Surgical History:  Procedure Laterality Date   BREAST BIOPSY Left 10/2012   BREAST BIOPSY Left 07/01/2014   malignant   BREAST LUMPECTOMY Left 08/20/2014   BRONCHIAL BIOPSY  08/23/2021   Procedure: BRONCHIAL BIOPSIES;  Surgeon: Leslye Peer, MD;  Location: [facility] ENDOSCOPY;  Service: Pulmonary;;   BRONCHIAL BRUSHINGS  08/23/2021   Procedure: BRONCHIAL BRUSHINGS;  Surgeon: Leslye Peer, MD;  Location: [facility] ENDOSCOPY;  Service: Pulmonary;;   BRONCHIAL NEEDLE ASPIRATION BIOPSY  08/23/2021    Procedure: BRONCHIAL NEEDLE ASPIRATION BIOPSIES;  Surgeon: Leslye Peer, MD;  Location: [facility] ENDOSCOPY;  Service: Pulmonary;;   BRONCHIAL WASHINGS  08/23/2021   Procedure: BRONCHIAL WASHINGS;  Surgeon: Leslye Peer, MD;  Location: [facility] ENDOSCOPY;  Service: Pulmonary;;   CESAREAN SECTION     CHOLECYSTECTOMY  1985   COLONOSCOPY  2002   Dr. Cleotis Nipper: internal hemorrhoids, one small rectal polyp, adenomatous path    COLONOSCOPY N/A 11/23/2015   Procedure: COLONOSCOPY;  Surgeon: West Bali, MD;  Location: [facility];  Service: Endoscopy;  Laterality: N/A;  1100 - moved to 10:45 - office to notify   ESOPHAGOGASTRODUODENOSCOPY N/A 11/23/2015   Procedure: ESOPHAGOGASTRODUODENOSCOPY (EGD);  Surgeon: West Bali, MD;  Location: [facility];  Service: Endoscopy;  Laterality: N/A;   PORT-A-CATH REMOVAL  02/2015   PORTACATH PLACEMENT Right 09/17/2014   Procedure: INSERTION PORT-A-CATH WITH ULTRASOUND;  Surgeon: Harriette Bouillon, MD;  Location: [facility];  Service: General;  Laterality: Right;  IJ   RADIOACTIVE SEED GUIDED PARTIAL MASTECTOMY WITH AXILLARY SENTINEL LYMPH NODE BIOPSY Left 08/20/2014   Procedure: LEFT BREAST LUMPECTOMY WITH RADIOACTIVE SEED LOCALIZATION AND SENTINEL LYMPH NODE MAPPING;  Surgeon: Harriette Bouillon, MD;  Location: [facility];  Service: General;  Laterality: Left;   RE-EXCISION OF BREAST LUMPECTOMY Left 09/17/2014   Procedure: RE-EXCISION OF BREAST LUMPECTOMY;  Surgeon: Harriette Bouillon, MD;  Location: [facility];  Service: General;  Laterality: Left;   TOTAL HIP ARTHROPLASTY Right 02/22/2021   Procedure: RIGHT TOTAL HIP ARTHROPLASTY ANTERIOR APPROACH;  Surgeon: Eldred Manges, MD;  Location: [facility] OR;  Service: Orthopedics;  Laterality: Right;   TUBAL LIGATION     VIDEO BRONCHOSCOPY WITH ENDOBRONCHIAL ULTRASOUND N/A 08/23/2021   Procedure: VIDEO BRONCHOSCOPY WITH ENDOBRONCHIAL ULTRASOUND;  Surgeon: Leslye Peer, MD;  Location: [facility]  ENDOSCOPY;  Service: Pulmonary;  Laterality: N/A;   VIDEO BRONCHOSCOPY WITH RADIAL ENDOBRONCHIAL ULTRASOUND  08/23/2021   Procedure: VIDEO BRONCHOSCOPY WITH RADIAL ENDOBRONCHIAL ULTRASOUND;  Surgeon: Leslye Peer, MD;  Location: [facility] END[facility];  Service: Pulmonary;;    FAMILY HISTORY: family history includes Alzheimer's disease in her maternal grandmother; COPD in her paternal grandfather; Cancer in her cousin, cousin, and paternal aunt; Colon cancer in her brother and cousin; Congestive Heart Failure in her paternal aunt; Diabetes in her mother; Emphysema in her paternal grandfather; Heart attack in her maternal grandfather and paternal grandmother; Kidney cancer in her maternal uncle; Melanoma (age of onset: 11) in her sister; Other in her father and sister; Ovarian cancer (age of onset: 66) in her mother; Stomach cancer in her paternal uncle.  SOCIAL HISTORY:  reports that she quit smoking about 7 months ago. Her smoking use included cigarettes. She has a 42.00 pack-year smoking history. She has never used smokeless tobacco. She reports that she does not drink alcohol and does not use drugs.  ALLERGIES: Patient has no known allergies.  MEDICATIONS:  Current Outpatient Medications  Medication Sig Dispense Refill   albuterol (PROVENTIL HFA;VENTOLIN HFA) 108 (90 BASE) MCG/ACT inhaler Inhale 2 puffs into the lungs every 6 (six) hours as needed for wheezing or shortness of breath.      ALPRAZolam (XANAX) 0.5 MG tablet Take 0.5 mg by mouth 3 (three) times daily.     ANORO ELLIPTA 62.5-25 MCG/INH AEPB Inhale 1  puff into the lungs daily.     aspirin EC 325 MG EC tablet Take 1 tablet (325 mg total) by mouth daily with breakfast. Must take at least 4 weeks postop for DVT prophylaxis (Patient not taking: Reported on 04/15/2021) 30 tablet 0   calcium carbonate (TUMS - DOSED IN MG ELEMENTAL CALCIUM) 500 MG chewable tablet Chew 2 tablets by mouth 3 (three) times daily as needed for indigestion or heartburn.      fluconazole (DIFLUCAN) 100 MG tablet Take 2 tablets today, then 1 tablet daily x 20 more days. 22 tablet 0   furosemide (LASIX) 20 MG tablet Take 20 mg by mouth daily.     hydrochlorothiazide (HYDRODIURIL) 12.5 MG tablet Take 12.5 mg by mouth daily.     HYDROcodone-acetaminophen (NORCO) 10-325 MG tablet Take 1 tablet by mouth every 4 (four) hours as needed. 30 tablet 0   ibuprofen (ADVIL) 800 MG tablet Take 800 mg by mouth every 8 (eight) hours as needed for moderate pain.     lidocaine (XYLOCAINE) 2 % solution Patient: Mix 1part 2% viscous lidocaine, 1part H20. Swallow 22mL of diluted mixture, before meals and at bedtime, up to QID for sore throat. 200 mL 3   Menthol, Topical Analgesic, (BIOFREEZE EX) Apply 1 application topically daily as needed (pain).     pantoprazole (PROTONIX) 40 MG tablet Take 1 tablet by mouth once daily 90 tablet 0   potassium chloride (KLOR-CON M) 10 MEQ tablet Take 10 mEq by mouth daily. Patient stated stated that she was started on 20 meq daily in August.     sertraline (ZOLOFT) 100 MG tablet Take 100 mg by mouth daily.     No current facility-administered medications for this encounter.    REVIEW OF SYSTEMS:  Notable for that above.   PHYSICAL EXAM:  vitals were not taken for this visit.   General: Alert and oriented, in no acute distress  HEENT: Head is normocephalic. Extraocular movements are intact.   Skin: No concerning lesions -skin has healed well over the neck and the radiation fields.. Musculoskeletal:  She is ambulatory Neurologic: Cranial nerves II through XII are grossly intact. No obvious focalities. Speech is fluent. Coordination is intact. Psychiatric: Judgment and insight are intact. Affect is appropriate.   ECOG = 1  0 - Asymptomatic (Fully active, able to carry on all predisease activities without restriction)  1 - Symptomatic but completely ambulatory (Restricted in physically strenuous activity but ambulatory and able to carry out  work of a light or sedentary nature. For example, light housework, office work)  2 - Symptomatic, <50% in bed during the day (Ambulatory and capable of all self care but unable to carry out any work activities. Up and about more than 50% of waking hours)  3 - Symptomatic, >50% in bed, but not bedbound (Capable of only limited self-care, confined to bed or chair 50% or more of waking hours)  4 - Bedbound (Completely disabled. Cannot carry on any self-care. Totally confined to bed or chair)  5 - Death   Santiago Glad MM, Creech RH, Tormey DC, et al. [phone]). "Toxicity and response criteria of the [facility]". Am. Evlyn Clines. Oncol. 5 (6): 649-55   LABORATORY DATA:  Lab Results  Component Value Date   WBC 6.6 09/24/2021   HGB 13.0 09/24/2021   HCT 38.2 09/24/2021   MCV 88.2 09/24/2021   PLT 200 09/24/2021   CMP     Component Value Date/Time   NA 142  09/24/2021 1307   K 3.1 (L) 09/24/2021 1307   CL 100 09/24/2021 1307   CO2 34 (H) 09/24/2021 1307   GLUCOSE 111 (H) 09/24/2021 1307   BUN 15 09/24/2021 1307   CREATININE 1.03 (H) 09/24/2021 1307   CALCIUM 10.1 09/24/2021 1307   PROT 7.4 09/24/2021 1307   ALBUMIN 4.0 09/24/2021 1307   AST 9 (L) 09/24/2021 1307   ALT 6 09/24/2021 1307   ALKPHOS 76 09/24/2021 1307   BILITOT 0.8 09/24/2021 1307   GFRNONAA 59 (L) 09/24/2021 1307   GFRAA >60 01/29/2020 1156         RADIOGRAPHY: No results found.    IMPRESSION/PLAN: Today we had a lengthy discussion about treating her lung cancer.  She has locally advanced, stage III, non-small cell lung cancer.  This is her third primary site of malignancy.  Fortunately the disease in her chest has not changed very much over the past couple of months per CT.  Dr.  Al Pimple and I have spoken about her case and we agree that she is a candidate for concurrent chemoradiation at this time to have the best chance of cure and local regional control.  Today I spoke in depth with the patient and  her sister about the logistics, risks, benefits and side effects of radiation therapy to the chest.  Radiation therapy would be given over 30 treatments and I would target the adenopathy in her chest, the left lower lung nodule, and the left supraclavicular node that was positive on PET scan.  The left supraclavicular region has been exposed to radiation previously, mainly from her breast cancer treatment years ago.  This area will receive a lower dose than the untreated areas in her chest.  We discussed the likely fatigue and esophagitis that I anticipate during radiation therapy.  It is much less likely that she will experience pneumonitis or injury to the lung, heart injury, or spinal cord injury.  We spoke about the risk of nerve injury to the brachial plexus given previous radiation exposure.  We discussed techniques that I will use to achieve a good risk-benefit balance as she undergoes reirradiation.  A consent form was signed and placed in her chart.  The patient is enthusiastic to proceed with treatment as planned.  We will provide a calendar with her upcoming treatments after her CT simulation today.  Anticipate starting treatment on April 10.  She has been referred to palliative care for an extra layer of support.  I discussed the purpose of palliative care.  She is enthusiastic about this service.  On date of service, in total, I spent 30 minutes on this encounter. Patient was seen in person.   __________________________________________   Lonie Peak, MD  This document serves as a record of services personally performed by Lonie Peak, MD. It was created on her behalf by Neena Rhymes, a trained medical scribe. The creation of this record is based on the scribe's personal observations and the provider's statements to them. This document has been checked and approved by the attending provider.

## 2021-10-20 ENCOUNTER — Inpatient Hospital Stay (HOSPITAL_BASED_OUTPATIENT_CLINIC_OR_DEPARTMENT_OTHER): Payer: Medicare HMO | Admitting: Hematology and Oncology

## 2021-10-20 ENCOUNTER — Ambulatory Visit: Admission: RE | Admit: 2021-10-20 | Payer: Medicare HMO | Source: Ambulatory Visit | Admitting: Radiation Oncology

## 2021-10-20 ENCOUNTER — Ambulatory Visit: Payer: Medicare HMO | Attending: Radiation Oncology

## 2021-10-20 ENCOUNTER — Encounter: Payer: Medicare HMO | Admitting: Nurse Practitioner

## 2021-10-20 ENCOUNTER — Encounter: Payer: Self-pay | Admitting: Hematology and Oncology

## 2021-10-20 ENCOUNTER — Ambulatory Visit
Admission: RE | Admit: 2021-10-20 | Discharge: 2021-10-20 | Disposition: A | Discharge status: Home / Self Care | Payer: Medicare HMO | Source: Ambulatory Visit | Attending: Radiation Oncology | Admitting: Radiation Oncology

## 2021-10-20 ENCOUNTER — Other Ambulatory Visit: Payer: Self-pay

## 2021-10-20 ENCOUNTER — Encounter: Payer: Self-pay | Admitting: Radiation Oncology

## 2021-10-20 VITALS — BP 118/57 | HR 73 | Temp 98.1°F | Resp 20 | Ht 64.0 in | Wt 213.0 lb

## 2021-10-20 DIAGNOSIS — K219 Gastro-esophageal reflux disease without esophagitis: Secondary | ICD-10-CM | POA: Insufficient documentation

## 2021-10-20 DIAGNOSIS — Z808 Family history of malignant neoplasm of other organs or systems: Secondary | ICD-10-CM | POA: Diagnosis not present

## 2021-10-20 DIAGNOSIS — Z923 Personal history of irradiation: Secondary | ICD-10-CM | POA: Insufficient documentation

## 2021-10-20 DIAGNOSIS — Z833 Family history of diabetes mellitus: Secondary | ICD-10-CM | POA: Diagnosis not present

## 2021-10-20 DIAGNOSIS — C3490 Malignant neoplasm of unspecified part of unspecified bronchus or lung: Secondary | ICD-10-CM | POA: Diagnosis not present

## 2021-10-20 DIAGNOSIS — Z800 Family history of malignant neoplasm of digestive organs: Secondary | ICD-10-CM | POA: Diagnosis not present

## 2021-10-20 DIAGNOSIS — C3432 Malignant neoplasm of lower lobe, left bronchus or lung: Secondary | ICD-10-CM | POA: Diagnosis not present

## 2021-10-20 DIAGNOSIS — J449 Chronic obstructive pulmonary disease, unspecified: Secondary | ICD-10-CM | POA: Insufficient documentation

## 2021-10-20 DIAGNOSIS — I10 Essential (primary) hypertension: Secondary | ICD-10-CM | POA: Diagnosis not present

## 2021-10-20 DIAGNOSIS — C320 Malignant neoplasm of glottis: Secondary | ICD-10-CM | POA: Diagnosis not present

## 2021-10-20 DIAGNOSIS — C321 Malignant neoplasm of supraglottis: Secondary | ICD-10-CM | POA: Insufficient documentation

## 2021-10-20 DIAGNOSIS — Z87891 Personal history of nicotine dependence: Secondary | ICD-10-CM | POA: Insufficient documentation

## 2021-10-20 DIAGNOSIS — Z79899 Other long term (current) drug therapy: Secondary | ICD-10-CM | POA: Diagnosis not present

## 2021-10-20 DIAGNOSIS — R232 Flushing: Secondary | ICD-10-CM | POA: Diagnosis not present

## 2021-10-20 DIAGNOSIS — Z170 Estrogen receptor positive status [ER+]: Secondary | ICD-10-CM

## 2021-10-20 DIAGNOSIS — R490 Dysphonia: Secondary | ICD-10-CM | POA: Insufficient documentation

## 2021-10-20 DIAGNOSIS — Z510 Encounter for antineoplastic radiation therapy: Secondary | ICD-10-CM | POA: Diagnosis not present

## 2021-10-20 DIAGNOSIS — Z8249 Family history of ischemic heart disease and other diseases of the circulatory system: Secondary | ICD-10-CM | POA: Diagnosis not present

## 2021-10-20 DIAGNOSIS — Z853 Personal history of malignant neoplasm of breast: Secondary | ICD-10-CM | POA: Diagnosis not present

## 2021-10-20 DIAGNOSIS — Z8041 Family history of malignant neoplasm of ovary: Secondary | ICD-10-CM | POA: Diagnosis not present

## 2021-10-20 DIAGNOSIS — C50412 Malignant neoplasm of upper-outer quadrant of left female breast: Secondary | ICD-10-CM | POA: Diagnosis not present

## 2021-10-20 DIAGNOSIS — Z818 Family history of other mental and behavioral disorders: Secondary | ICD-10-CM | POA: Diagnosis not present

## 2021-10-20 DIAGNOSIS — Z9049 Acquired absence of other specified parts of digestive tract: Secondary | ICD-10-CM | POA: Diagnosis not present

## 2021-10-20 DIAGNOSIS — Z8719 Personal history of other diseases of the digestive system: Secondary | ICD-10-CM | POA: Diagnosis not present

## 2021-10-20 DIAGNOSIS — Z8051 Family history of malignant neoplasm of kidney: Secondary | ICD-10-CM | POA: Diagnosis not present

## 2021-10-20 DIAGNOSIS — Z79811 Long term (current) use of aromatase inhibitors: Secondary | ICD-10-CM | POA: Diagnosis not present

## 2021-10-20 DIAGNOSIS — J029 Acute pharyngitis, unspecified: Secondary | ICD-10-CM | POA: Diagnosis not present

## 2021-10-20 MED ORDER — SODIUM CHLORIDE 0.9% FLUSH: 10.0000 mL | Freq: Once | INTRAVENOUS | Status: DC

## 2021-10-20 NOTE — Assessment & Plan Note (Addendum)
No concern for recurrence of breast cancer at this time.  No significant role for antiestrogen therapy since she has completed 6 years.She is due for mammogram.  Screening mammogram ordered.  ?

## 2021-10-20 NOTE — Progress Notes (Signed)
Patient on plan of care prior to pathways. 

## 2021-10-20 NOTE — Progress Notes (Signed)
Physical examination deferred in lieu of counseling.  [facility] CONSULT NOTE  Patient Care Team: Benita Stabile, MD as PCP - General (Internal Medicine) Laqueta Linden, MD (Inactive) as PCP - Cardiology (Cardiology) Galen Manila, Novella Olive, MD (Inactive) as Consulting Physician (Hematology and Oncology) Harriette Bouillon, MD as Consulting Physician (General Surgery) Lurline Hare, MD as Consulting Physician (Radiation Oncology) Salomon Fick, NP as Nurse Practitioner (Hematology and Oncology) West Bali, MD (Inactive) as Consulting Physician (Gastroenterology) Malmfelt, Lise Auer, RN as Oncology Nurse Navigator Christia Reading, MD as Consulting Physician (Otolaryngology) Lonie Peak, MD as Consulting Physician (Radiation Oncology) [name], MD as Consulting Physician (Hematology and Oncology)  CHIEF COMPLAINTS/PURPOSE OF CONSULTATION:  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  She had prior medical history of left breast cancer.  I reviewed her records extensively and collaborated the history with the patient.  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    06/24/2014 Breast US   Left breast: ill-defined predominantly hypoechoic area with some increased echogenicity in the 2 o'clock position of the left breast, 7 cm from the nipple. 2.6 x 2.3 x 1.8 cm in maximum dimensions.   07/01/2014 Initial Biopsy   Left breast core  needle bx: Invasive mammary carcinoma with lobular features, ER+ (100%), PR+ (91%), HER2/neu negative (ratio 1.09), Ki-67 32%. E-cadherin strongly positive, diagnostic for IDC    07/01/2014 Clinical Stage   Stage IIA: T2 N0   08/20/2014 Definitive Surgery   Left breast seed localized lumpectomy/SLNB: IDC, +LVI, DCIS, 1 LN removed and positive for metastatic carcinoma. Grade 2. HER2/neu repeated and negative (ratio 0.69-1.9).    08/20/2014 Pathologic Stage   Stage IIB: pT2 pN1a pMx   09/17/2014 Surgery   Port-a cath placement and re-excision   10/02/2014 - 01/15/2015 Chemotherapy   CMF x 6 cycles.  patient refused Taxane-containing chemotherapy.   03/07/2015 Procedure   Comp Cancer Panel reveals no variant at APC, ATM, AXIN2, BARD1, BMPR1A, BRCA1, BRCA2, BRIP1, CDH1, CDK4, CDKN2A, CHEK2, FANCC, MLH1, MSH2, MSH6, MUTYH, NBN, PALB2, PMS2, POLD1, POLE, PTEN, RAD51C, RAD51D, SMAD4, STK11, TP53, VHL, and XRCC2.    04/06/2015 - 05/21/2015 Radiation Therapy   Adjuvant RT Michell Heinrich): Left breast/ 45 Gy over 25 fractions.   Left supraclavicular fossa and axilla/ 45 Gy over 25 fractions. Left breast boost/ 16 Gy over 8 fractions. Total dose: 61 Gy   06/04/2015 - 08/07/2015 Anti-estrogen oral therapy   Exemestane 25 mg daily.  Planned duration of therapy 5 years.    07/23/2015 Survivorship   Survivorship care plan completed and mailed to patient in lieu of in person visit   08/06/2015 Adverse Reaction   Severe hot flashes and mood swings   08/08/2015 - 12/07/2015 Anti-estrogen oral therapy   Arimidex   12/04/2015 Adverse Reaction   Worsening depression, patient stopped Arimidex   12/07/2015 -  Anti-estrogen oral therapy   Tamoxifen   Squamous cell lung cancer (HCC)  08/31/2021 Initial Diagnosis  Squamous cell carcinoma of lung (HCC)   09/08/2021 Cancer Staging   Staging form: Lung, AJCC 8th Edition - Clinical stage from 09/08/2021: Stage IIIB (cT1b, cN3, cM0) - Signed by Lonie Peak, MD on  09/08/2021 Stage prefix: Initial diagnosis    11/01/2021 -  Chemotherapy   Patient is on Treatment Plan : LUNG Carboplatin / Paclitaxel + XRT q7d      She is currently on adjuvant tamoxifen.  She was recently seen for chief complaint of raspiness of her voice which then progressed to severe hoarseness.  She became so uncomfortable that she could not talk.  She denied any problem with swallowing or breathing.  She had thyroid ultrasound which revealed a suspicious nodule in the mid right thyroid lobe measuring 5 cm in greatest extent warranting further evaluation.  Subsequently the patient saw Dr. Jenne Pane who performed a laryngoscopy which revealed a granular mass of right glottis suggestive of glottic cancer.  She then had CT neck which revealed a 9 mm nodule about right vocal cord consistent with history of glottic carcinoma, no enlarged lymph nodes.  There was however an enlarged right paratracheal lymph node which was thought to be suspicious for metastatic disease as well as the right thyroid nodule measuring 3.9 x 3.4 cm.  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  She then had imaging of the chest which revealed multiple enlarged mediastinal, bilateral hilar, left supraclavicular lymph nodes consistent with nodal metastatic disease, multiple small bilateral pulmonary nodules suspicious for pulmonary metastatic disease, additional nonspecific small nodules.  She then had PET/CT which demonstrated small right glottic lesion is mildly hypermetabolic and consistent with no neoplasm.  PET also demonstrated borderline right level 2 lymph node left supraclavicular, mediastinal and hilar lymphadenopathy and hypermetabolic left lower lobe pulmonary nodule all suspicious of metastatic focus.  No abdominal pelvic metastatic disease or osseous metastatic disease was appreciated.  Cytology from thyroid biopsy showed a benign follicular nodule Bethesda category 2 Lung biopsy  showed NSCLC consistent with squamous cell carcinoma, PDL1 pending  She saw my colleague Dr. Basilio Cairo and recommendation was to consider radiation for the glottic cancer and to also proceed with bronchoscopy to look at the etiology of the nodes in her chest.  In the past she had breast cancer which was grade 2 ER 100%, PR 91% and HER2 negative KI of 32%  She is a heavy smoker, smoked 2 PPD , started at the age of 60. She took Tamoxifen for almost 6 yrs, stopped it before hip replacement in Aug 2022.  Interval history  Since her last visit, she completed radiation to the neck and has recovered very well. She is very pleased with the response so far.  Since her last visit, myself and Dr. Basilio Cairo discussed about proceeding with concurrent chemoradiation with weekly CarboTaxol followed by adjuvant immunotherapy.  She is here to discuss about this.  We have once again discussed that this is likely the best option to provide her with a curative intent treatment although the likelihood of cure is approximately 25% or less.  She understands this and would like to proceed with that treatment.  Currently first cycle of radiation is planned for April 10.  She denies any new complaints today, appears to be in much brighter spirits today.  Rest of the pertinent 10 point ROS reviewed and negative.  MEDICAL HISTORY:  Past Medical History:  Diagnosis Date   Arthritis    Asthma    Asymptomatic  varicose veins    Breast cancer (HCC) 06/2014   left   Cancer (HCC)    Supra Glottis   Chronic airway obstruction (HCC)    Complication of anesthesia 2016   difficulty waking up, Oxygen dropped low.   COPD (chronic obstructive pulmonary disease) (HCC)    Depression    GERD (gastroesophageal reflux disease)    Hemorrhage rectum    and anus.   Hypertension    Insomnia    Other symptoms involving digestive system(787.99)    Pain    Hx.pain in joint involving pelvic region and thigh; pain in limb   Palpitations     Panic attacks    Personal history of chemotherapy 2016   Personal history of radiation therapy 2016   Pneumonia    Pre-diabetes    Sinusitis    Symptomatic menopausal or female climacteric states    Varicose veins of both lower extremities with pain     SURGICAL HISTORY: Past Surgical History:  Procedure Laterality Date   BREAST BIOPSY Left 10/2012   BREAST BIOPSY Left 07/01/2014   malignant   BREAST LUMPECTOMY Left 08/20/2014   BRONCHIAL BIOPSY  08/23/2021   Procedure: BRONCHIAL BIOPSIES;  Surgeon: Leslye Peer, MD;  Location: [facility];  Service: Pulmonary;;   BRONCHIAL BRUSHINGS  08/23/2021   Procedure: BRONCHIAL BRUSHINGS;  Surgeon: Leslye Peer, MD;  Location: [facility] ENDOSCOPY;  Service: Pulmonary;;   BRONCHIAL NEEDLE ASPIRATION BIOPSY  08/23/2021   Procedure: BRONCHIAL NEEDLE ASPIRATION BIOPSIES;  Surgeon: Leslye Peer, MD;  Location: [facility];  Service: Pulmonary;;   BRONCHIAL WASHINGS  08/23/2021   Procedure: BRONCHIAL WASHINGS;  Surgeon: Leslye Peer, MD;  Location: [facility];  Service: Pulmonary;;   CESAREAN SECTION     CHOLECYSTECTOMY  1985   COLONOSCOPY  2002   Dr. Cleotis Nipper: internal hemorrhoids, one small rectal polyp, adenomatous path    COLONOSCOPY N/A 11/23/2015   Procedure: COLONOSCOPY;  Surgeon: West Bali, MD;  Location: [facility];  Service: Endoscopy;  Laterality: N/A;  1100 - moved to 10:45 - office to notify   ESOPHAGOGASTRODUODENOSCOPY N/A 11/23/2015   Procedure: ESOPHAGOGASTRODUODENOSCOPY (EGD);  Surgeon: West Bali, MD;  Location: [facility];  Service: Endoscopy;  Laterality: N/A;   PORT-A-CATH REMOVAL  02/2015   PORTACATH PLACEMENT Right 09/17/2014   Procedure: INSERTION PORT-A-CATH WITH ULTRASOUND;  Surgeon: Harriette Bouillon, MD;  Location: [facility];  Service: General;  Laterality: Right;  IJ   RADIOACTIVE SEED GUIDED PARTIAL MASTECTOMY WITH AXILLARY SENTINEL LYMPH NODE BIOPSY Left 08/20/2014   Procedure: LEFT  BREAST LUMPECTOMY WITH RADIOACTIVE SEED LOCALIZATION AND SENTINEL LYMPH NODE MAPPING;  Surgeon: Harriette Bouillon, MD;  Location: [facility];  Service: General;  Laterality: Left;   RE-EXCISION OF BREAST LUMPECTOMY Left 09/17/2014   Procedure: RE-EXCISION OF BREAST LUMPECTOMY;  Surgeon: Harriette Bouillon, MD;  Location: [facility];  Service: General;  Laterality: Left;   TOTAL HIP ARTHROPLASTY Right 02/22/2021   Procedure: RIGHT TOTAL HIP ARTHROPLASTY ANTERIOR APPROACH;  Surgeon: Eldred Manges, MD;  Location: [facility] OR;  Service: Orthopedics;  Laterality: Right;   TUBAL LIGATION     VIDEO BRONCHOSCOPY WITH ENDOBRONCHIAL ULTRASOUND N/A 08/23/2021   Procedure: VIDEO BRONCHOSCOPY WITH ENDOBRONCHIAL ULTRASOUND;  Surgeon: Leslye Peer, MD;  Location: [facility];  Service: Pulmonary;  Laterality: N/A;   VIDEO BRONCHOSCOPY WITH RADIAL ENDOBRONCHIAL ULTRASOUND  08/23/2021   Procedure: VIDEO BRONCHOSCOPY WITH RADIAL ENDOBRONCHIAL ULTRASOUND;  Surgeon: Leslye Peer, MD;  Location: [facility];  Service: Pulmonary;;    SOCIAL HISTORY: Social History   Socioeconomic History   Marital status: Divorced    Spouse name: Not on file   Number of children: 2   Years of education: Not on file   Highest education level: Not on file  Occupational History   Not on file  Tobacco Use   Smoking status: Former    Packs/day: 1.00    Years: 42.00    Pack years: 42.00    Types: Cigarettes    Quit date: 02/22/2021    Years since quitting: 0.6   Smokeless tobacco: Never  Vaping Use   Vaping Use: Never used  Substance and Sexual Activity   Alcohol use: No    Alcohol/week: 0.0 standard drinks   Drug use: No   Sexual activity: Never    Birth control/protection: None  Other Topics Concern   Not on file  Social History Narrative   Not on file   Social Determinants of Health   Financial Resource Strain: Not on file  Food Insecurity: Not on file  Transportation Needs: Not on file   Physical Activity: Not on file  Stress: Not on file  Social Connections: Unknown   Frequency of Communication with Friends and Family: More than three times a week   Frequency of Social Gatherings with Friends and Family: More than three times a week   Attends Religious Services: Not on Scientist, clinical (histocompatibility and immunogenetics) or Organizations: Not on file   Attends Banker Meetings: Not on file   Marital Status: Not on file  Intimate Partner Violence: Not on file    FAMILY HISTORY: Family History  Problem Relation Age of Onset   Diabetes Mother    Ovarian cancer Mother 24   Other Father        died in MVA at age 66   Other Sister        mitral valve disorder   Melanoma Sister 11       removed from back of leg   Colon cancer Brother        early 56s, succumbed to disease   Cancer Paternal Aunt        leukemia, bone, or lung cancer   Alzheimer's disease Maternal Grandmother    Heart attack Maternal Grandfather    Heart attack Paternal Grandmother    COPD Paternal Grandfather    Emphysema Paternal Grandfather    Congestive Heart Failure Paternal Aunt    Stomach cancer Paternal Uncle    Kidney cancer Maternal Uncle    Cancer Cousin        dx. teens/dx. 40s   Cancer Cousin    Colon cancer Cousin     ALLERGIES:  has No Known Allergies.  MEDICATIONS:  Current Outpatient Medications  Medication Sig Dispense Refill   albuterol (PROVENTIL HFA;VENTOLIN HFA) 108 (90 BASE) MCG/ACT inhaler Inhale 2 puffs into the lungs every 6 (six) hours as needed for wheezing or shortness of breath.      ALPRAZolam (XANAX) 0.5 MG tablet Take 0.5 mg by mouth 3 (three) times daily.     ANORO ELLIPTA 62.5-25 MCG/INH AEPB Inhale 1 puff into the lungs daily.     aspirin EC 325 MG EC tablet Take 1 tablet (325 mg total) by mouth daily with breakfast. Must take at least 4 weeks postop for DVT prophylaxis (Patient not taking: Reported on 04/15/2021) 30 tablet 0   calcium carbonate (TUMS - DOSED IN  MG  ELEMENTAL CALCIUM) 500 MG chewable tablet Chew 2 tablets by mouth 3 (three) times daily as needed for indigestion or heartburn.     fluconazole (DIFLUCAN) 100 MG tablet Take 2 tablets today, then 1 tablet daily x 20 more days. 22 tablet 0   furosemide (LASIX) 20 MG tablet Take 20 mg by mouth daily.     hydrochlorothiazide (HYDRODIURIL) 12.5 MG tablet Take 12.5 mg by mouth daily.     ibuprofen (ADVIL) 800 MG tablet Take 800 mg by mouth every 8 (eight) hours as needed for moderate pain.     lidocaine (XYLOCAINE) 2 % solution Patient: Mix 1part 2% viscous lidocaine, 1part H20. Swallow 42mL of diluted mixture, before meals and at bedtime, up to QID for sore throat. 200 mL 3   Menthol, Topical Analgesic, (BIOFREEZE EX) Apply 1 application topically daily as needed (pain).     pantoprazole (PROTONIX) 40 MG tablet Take 1 tablet by mouth once daily 90 tablet 0   potassium chloride (KLOR-CON M) 10 MEQ tablet Take 10 mEq by mouth daily. Patient stated stated that she was started on 20 meq daily in August.     sertraline (ZOLOFT) 100 MG tablet Take 100 mg by mouth daily.     No current facility-administered medications for this visit.   Facility-Administered Medications Ordered in Other Visits  Medication Dose Route Frequency Provider Last Rate Last Admin   sodium chloride flush (NS) 0.9 % injection 10 mL  10 mL Intravenous Once Lonie Peak, MD        PHYSICAL EXAMINATION: ECOG PERFORMANCE STATUS: 0 - Asymptomatic  Vitals:   10/20/21 1537  BP: (!) 144/69  Resp: 16  Temp: 97.9 F (36.6 C)  SpO2: 97%    Filed Weights   10/20/21 1537  Weight: 213 lb 1.6 oz (96.7 kg)   Physical Exam Constitutional:      Appearance: Normal appearance.  Cardiovascular:     Rate and Rhythm: Normal rate and regular rhythm.     Pulses: Normal pulses.     Heart sounds: Normal heart sounds.  Pulmonary:     Effort: Pulmonary effort is normal.     Breath sounds: Normal breath sounds.  Musculoskeletal:         General: No swelling.     Cervical back: Normal range of motion and neck supple. No rigidity.  Lymphadenopathy:     Cervical: No cervical adenopathy.  Skin:    General: Skin is warm and dry.  Neurological:     General: No focal deficit present.     Mental Status: She is alert.  Psychiatric:        Mood and Affect: Mood normal.     LABORATORY DATA:  I have reviewed the data as listed Lab Results  Component Value Date   WBC 6.6 09/24/2021   HGB 13.0 09/24/2021   HCT 38.2 09/24/2021   MCV 88.2 09/24/2021   PLT 200 09/24/2021   Lab Results  Component Value Date   NA 142 09/24/2021   K 3.1 (L) 09/24/2021   CL 100 09/24/2021   CO2 34 (H) 09/24/2021    RADIOGRAPHIC STUDIES: I have personally reviewed the radiological reports and agreed with the findings in the report.  ASSESSMENT AND PLAN:   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HCC) She completed definitive radiation and has been healing well.  So far it appears that she had excellent clinical response.  She will continue to be followed by Dr. Basilio Cairo for further recommendations.  Squamous cell lung cancer (HCC) Cytology from left lower lobe lung nodule consistent with squamous cell carcinoma,. This was previously sent but concern was that fax may not have been delivered, resent today again. Given supraclavicular lymphadenopathy, I believe this is atleast stage IIIB lung cancer. We have discussed about proceeding with concurrent chemotherapy radiation with weekly CarboTaxol.  PD-L1 20% hence she is not a candidate for single agent immunotherapy.  We will consider adjuvant immunotherapy after completion of concurrent chemoradiation for 1 year.  We once again discussed about adverse effects of chemotherapy including but not limited to fatigue, nausea, vomiting, increased risk of infections, neuropathy. She is willing to proceed with treatment as instructed.  Treatment plan placed in chart. Return to clinic on April 10 before  cycle 1 day 1   Carcinoma of upper-outer quadrant of left female breast No concern for recurrence of breast cancer at this time.  No significant role for antiestrogen therapy since she has completed 6 years.She is due for mammogram.  Screening mammogram ordered.     Total time spent: 30 minutes  All questions were answered. The patient knows to call the clinic with any problems, questions or concerns.    [name], MD 10/20/21

## 2021-10-20 NOTE — Assessment & Plan Note (Addendum)
Cytology from left lower lobe lung nodule consistent with squamous cell carcinoma,. This was previously sent but concern was that fax may not have been delivered, resent today again. ?Given supraclavicular lymphadenopathy, I believe this is atleast stage IIIB lung cancer. ?We have discussed about proceeding with concurrent chemotherapy radiation with weekly CarboTaxol.  PD-L1 29% hence she is not a candidate for single agent immunotherapy.  We will consider adjuvant immunotherapy after completion of concurrent chemoradiation for 1 year.  We once again discussed about adverse effects of chemotherapy including but not limited to fatigue, nausea, vomiting, increased risk of infections, neuropathy. ?She is willing to proceed with treatment as instructed.  Treatment plan placed in chart. ?Return to clinic on April 10 before cycle 1 day 1 ? ?

## 2021-10-20 NOTE — Progress Notes (Addendum)
Regina Mcmahon presents today to review CT scans results from 10/18/21 and discuss discuss radiation options prior to simulation planning this afternoon ? ?Reports increase in vocal hoarseness, and gets winded while trying to speak. Reports she has moved into her new assisted living apartment  and is getting settled. Having difficulty swallowing her medications, but denies new issues eating/drinking. Scheduled to meet with medical oncologist Dr. Chryl Heck later today. Confirmed with Palliative Care team that they will meet with patient after she starts her new treatment regimen to establish care/support. ? ?

## 2021-10-20 NOTE — Assessment & Plan Note (Signed)
She completed definitive radiation and has been healing well.  So far it appears that she had excellent clinical response.  She will continue to be followed by Dr. Isidore Moos for further recommendations. ?

## 2021-10-20 NOTE — Progress Notes (Signed)
START ON PATHWAY REGIMEN - Non-Small Cell Lung ? ? ?  Administer weekly: ?    Paclitaxel  ?    Carboplatin  ? ?**Always confirm dose/schedule in your pharmacy ordering system** ? ?Patient Characteristics: ?Preoperative or Nonsurgical Candidate (Clinical Staging), Stage III - Nonsurgical Candidate (Nonsquamous and Squamous), PS = 0, 1 ?Therapeutic Status: Preoperative or Nonsurgical Candidate (Clinical Staging) ?AJCC T Category: cT1b ?AJCC N Category: cN3 ?AJCC M Category: cM0 ?AJCC 8 Stage Grouping: IIIB ?ECOG Performance Status: 1 ?Intent of Therapy: ?Curative Intent, Discussed with Patient ?

## 2021-10-21 ENCOUNTER — Telehealth: Payer: Self-pay | Admitting: Nurse Practitioner

## 2021-10-21 ENCOUNTER — Telehealth: Payer: Self-pay | Admitting: Hematology and Oncology

## 2021-10-21 NOTE — Telephone Encounter (Signed)
Scheduled per 3/30 secure chat, pt has been called and confirmed appt ?

## 2021-10-21 NOTE — Telephone Encounter (Signed)
Scheduled appointment per 03/29  los. Patient aware. ? ?

## 2021-10-25 ENCOUNTER — Encounter: Payer: Self-pay | Admitting: General Practice

## 2021-10-25 NOTE — Progress Notes (Signed)
Pharmacist Chemotherapy Monitoring - Initial Assessment   ? ?Anticipated start date: 11/01/21  ? ?The following has been reviewed per standard work regarding the patient's treatment regimen: ?The patient's diagnosis, treatment plan and drug doses, and organ/hematologic function ?Lab orders and baseline tests specific to treatment regimen  ?The treatment plan start date, drug sequencing, and pre-medications ?Prior authorization status  ?Patient's documented medication list, including drug-drug interaction screen and prescriptions for anti-emetics and supportive care specific to the treatment regimen ?The drug concentrations, fluid compatibility, administration routes, and timing of the medications to be used ?The patient's access for treatment and lifetime cumulative dose history, if applicable  ?The patient's medication allergies and previous infusion related reactions, if applicable  ? ?Changes made to treatment plan:  ?N/A ? ?Follow up needed:  ?Pending authorization for treatment  ? ? ?Philomena Course, [place], ?10/25/2021  12:55 PM  ?

## 2021-10-25 NOTE — Progress Notes (Signed)
[facility] Spiritual Care Note ? ?Followed up with Regina Mcmahon by phone for spiritual and emotional support. Her voice is still compromised from treatment, so we kept our call brief. She is contemplating her mortality regularly and feeling apprehension about concurrent chemotherapy and radiation, especially given how worn out she feels from past radiation. She reports good support from her children and is pleased about her new apartment, which she has worked hard to settle into prior to this round of treatment. ? ?Regina Mcmahon is aware of ongoing chaplain availability by phone, and we plan to follow up in infusion, as well. ? ? ?Chaplain Lorrin Jackson, MDiv, [facility] ?Pager [phone] ?Voicemail [phone]  ?

## 2021-10-27 ENCOUNTER — Ambulatory Visit: Payer: Medicare HMO | Admitting: Radiation Oncology

## 2021-10-28 ENCOUNTER — Ambulatory Visit: Payer: Medicare HMO

## 2021-10-29 ENCOUNTER — Telehealth: Payer: Self-pay | Admitting: Hematology and Oncology

## 2021-10-29 ENCOUNTER — Ambulatory Visit: Payer: Medicare HMO

## 2021-10-29 NOTE — Telephone Encounter (Signed)
Contacted patient about changed appointment, Patient is aware of change.  ?

## 2021-10-31 NOTE — Progress Notes (Signed)
[facility] CONSULT NOTE  Patient Care Team: Benita Stabile, MD as PCP - General (Internal Medicine) Laqueta Linden, MD (Inactive) as PCP - Cardiology (Cardiology) Galen Manila, Novella Olive, MD (Inactive) as Consulting Physician (Hematology and Oncology) Harriette Bouillon, MD as Consulting Physician (General Surgery) Lurline Hare, MD as Consulting Physician (Radiation Oncology) Salomon Fick, NP as Nurse Practitioner (Hematology and Oncology) West Bali, MD (Inactive) as Consulting Physician (Gastroenterology) Malmfelt, Lise Auer, RN as Oncology Nurse Navigator Christia Reading, MD as Consulting Physician (Otolaryngology) Lonie Peak, MD as Consulting Physician (Radiation Oncology) [name], MD as Consulting Physician (Hematology and Oncology)  CHIEF COMPLAINTS/PURPOSE OF CONSULTATION:  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  She had prior medical history of left breast cancer.  I reviewed her records extensively and collaborated the history with the patient.  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    06/24/2014 Breast US   Left breast: ill-defined predominantly hypoechoic area with some increased echogenicity in the 2 o'clock position of the left breast, 7 cm from the nipple. 2.6 x 2.3 x 1.8 cm in maximum dimensions.   07/01/2014 Initial Biopsy   Left breast core needle bx: Invasive mammary carcinoma with lobular  features, ER+ (100%), PR+ (91%), HER2/neu negative (ratio 1.09), Ki-67 32%. E-cadherin strongly positive, diagnostic for IDC    07/01/2014 Clinical Stage   Stage IIA: T2 N0   08/20/2014 Definitive Surgery   Left breast seed localized lumpectomy/SLNB: IDC, +LVI, DCIS, 1 LN removed and positive for metastatic carcinoma. Grade 2. HER2/neu repeated and negative (ratio 0.69-1.9).    08/20/2014 Pathologic Stage   Stage IIB: pT2 pN1a pMx   09/17/2014 Surgery   Port-a cath placement and re-excision   10/02/2014 - 01/15/2015 Chemotherapy   CMF x 6 cycles.  patient refused Taxane-containing chemotherapy.   03/07/2015 Procedure   Comp Cancer Panel reveals no variant at APC, ATM, AXIN2, BARD1, BMPR1A, BRCA1, BRCA2, BRIP1, CDH1, CDK4, CDKN2A, CHEK2, FANCC, MLH1, MSH2, MSH6, MUTYH, NBN, PALB2, PMS2, POLD1, POLE, PTEN, RAD51C, RAD51D, SMAD4, STK11, TP53, VHL, and XRCC2.    04/06/2015 - 05/21/2015 Radiation Therapy   Adjuvant RT Michell Heinrich): Left breast/ 45 Gy over 25 fractions.   Left supraclavicular fossa and axilla/ 45 Gy over 25 fractions. Left breast boost/ 16 Gy over 8 fractions. Total dose: 61 Gy   06/04/2015 - 08/07/2015 Anti-estrogen oral therapy   Exemestane 25 mg daily.  Planned duration of therapy 5 years.    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  PET also demonstrated borderline right level 2 lymph node left supraclavicular, mediastinal and hilar lymphadenopathy and hypermetabolic left lower lobe pulmonary nodule all suspicious of metastatic focus.  No abdominal pelvic metastatic disease or osseous metastatic disease was appreciated.   08/31/2021 Initial Diagnosis   Squamous cell carcinoma of lung (HCC)   09/08/2021 Cancer Staging   Staging form: Lung, AJCC 8th Edition - Clinical stage from 09/08/2021: Stage IIIB (cT1b, cN3, cM0) - Signed by Lonie Peak, MD on 09/08/2021 Stage prefix: Initial diagnosis    09/30/2021 Pathology Results   Guardant 360 Pathology PDL 1 29%   11/01/2021 -  Chemotherapy   Patient is on Treatment Plan : LUNG Carboplatin / Paclitaxel + XRT q7d       Cytology from thyroid biopsy showed a benign follicular nodule Bethesda category 2  Interval history  She is here with her sister.  She is understandably very anxious. They once again question the role of concurrent chemotherapy radiation, were wondering if they can do it 1 after the other.  We have received multiple messages from radiation oncology team as well as chaplain saying that patient is not willing to proceed with chemotherapy but patient denies saying anything about this. She feels well except for by underlying anxiety, she has already taken a Xanax for today. She was hoping to get a PICC line or port since she is a difficult access.  Rest of the pertinent 10 point ROS reviewed and negative.  MEDICAL HISTORY:  Past Medical History:  Diagnosis Date   Arthritis    Asthma    Asymptomatic varicose veins    Breast cancer (HCC) 06/2014   left   Cancer (HCC)    Supra Glottis   Chronic airway obstruction (HCC)    Complication of anesthesia 2016   difficulty waking up, Oxygen dropped low.   COPD (chronic obstructive pulmonary  disease) (HCC)    Depression    GERD (gastroesophageal reflux disease)    Hemorrhage rectum    and anus.   Hypertension    Insomnia    Other symptoms involving digestive system(787.99)    Pain    Hx.pain in joint involving pelvic region and thigh; pain in limb   Palpitations    Panic attacks    Personal history of chemotherapy 2016   Personal history of radiation therapy 2016   Pneumonia    Pre-diabetes    Sinusitis    Symptomatic menopausal or female climacteric states    Varicose veins of both lower extremities with pain     SURGICAL HISTORY: Past Surgical History:  Procedure Laterality Date   BREAST BIOPSY Left 10/2012   BREAST BIOPSY Left 07/01/2014   malignant   BREAST LUMPECTOMY Left 08/20/2014   BRONCHIAL BIOPSY  08/23/2021   Procedure: BRONCHIAL BIOPSIES;  Surgeon: Leslye Peer, MD;  Location: [facility] ENDOSCOPY;  Service: Pulmonary;;   BRONCHIAL BRUSHINGS  08/23/2021   Procedure: BRONCHIAL BRUSHINGS;  Surgeon: Leslye Peer, MD;  Location: [facility]COPY;  Service: Pulmonary;;   BRONCHIAL NEEDLE ASPIRATION BIOPSY  08/23/2021   Procedure: BRONCHIAL NEEDLE ASPIRATION BIOPSIES;  Surgeon: Leslye Peer, MD;  Location: [facility] ENDOSCOPY;  Service: Pulmonary;;  BRONCHIAL WASHINGS  08/23/2021   Procedure: BRONCHIAL WASHINGS;  Surgeon: Leslye Peer, MD;  Location: [facility] ENDOSCOPY;  Service: Pulmonary;;   CESAREAN SECTION     CHOLECYSTECTOMY  1985   COLONOSCOPY  2002   Dr. Cleotis Nipper: internal hemorrhoids, one small rectal polyp, adenomatous path    COLONOSCOPY N/A 11/23/2015   Procedure: COLONOSCOPY;  Surgeon: West Bali, MD;  Location: [facility]O SU[facility];  Service: Endoscopy;  Laterality: N/A;  1100 - moved to 10:45 - office to notify   ESOPHAGOGASTRODUODENOSCOPY N/A 11/23/2015   Procedure: ESOPHAGOGASTRODUODENOSCOPY (EGD);  Surgeon: West Bali, MD;  Location: [facility];  Service: Endoscopy;  Laterality: N/A;   PORT-A-CATH REMOVAL  02/2015   PORTACATH PLACEMENT Right 09/17/2014    Procedure: INSERTION PORT-A-CATH WITH ULTRASOUND;  Surgeon: Harriette Bouillon, MD;  Location: [facility];  Service: General;  Laterality: Right;  IJ   RADIOACTIVE SEED GUIDED PARTIAL MASTECTOMY WITH AXILLARY SENTINEL LYMPH NODE BIOPSY Left 08/20/2014   Procedure: LEFT BREAST LUMPECTOMY WITH RADIOACTIVE SEED LOCALIZATION AND SENTINEL LYMPH NODE MAPPING;  Surgeon: Harriette Bouillon, MD;  Location: [facility];  Service: General;  Laterality: Left;   RE-EXCISION OF BREAST LUMPECTOMY Left 09/17/2014   Procedure: RE-EXCISION OF BREAST LUMPECTOMY;  Surgeon: Harriette Bouillon, MD;  Location: [facility];  Service: General;  Laterality: Left;   TOTAL HIP ARTHROPLASTY Right 02/22/2021   Procedure: RIGHT TOTAL HIP ARTHROPLASTY ANTERIOR APPROACH;  Surgeon: Eldred Manges, MD;  Location: [facility] OR;  Service: Orthopedics;  Laterality: Right;   TUBAL LIGATION     VIDEO BRONCHOSCOPY WITH ENDOBRONCHIAL ULTRASOUND N/A 08/23/2021   Procedure: VIDEO BRONCHOSCOPY WITH ENDOBRONCHIAL ULTRASOUND;  Surgeon: Leslye Peer, MD;  Location: [facility] ENDOSCOPY;  Service: Pulmonary;  Laterality: N/A;   VIDEO BRONCHOSCOPY WITH RADIAL ENDOBRONCHIAL ULTRASOUND  08/23/2021   Procedure: VIDEO BRONCHOSCOPY WITH RADIAL ENDOBRONCHIAL ULTRASOUND;  Surgeon: Leslye Peer, MD;  Location: [facility] ENDOS[facility];  Service: Pulmonary;;    SOCIAL HISTORY: Social History   Socioeconomic History   Marital status: Divorced    Spouse name: Not on file   Number of children: 2   Years of education: Not on file   Highest education level: Not on file  Occupational History   Not on file  Tobacco Use   Smoking status: Former    Packs/day: 1.00    Years: 42.00    Pack years: 42.00    Types: Cigarettes    Quit date: 02/22/2021    Years since quitting: 0.6   Smokeless tobacco: Never  Vaping Use   Vaping Use: Never used  Substance and Sexual Activity   Alcohol use: No    Alcohol/week: 0.0 standard drinks   Drug use: No    Sexual activity: Never    Birth control/protection: None  Other Topics Concern   Not on file  Social History Narrative   Not on file   Social Determinants of Health   Financial Resource Strain: Not on file  Food Insecurity: Not on file  Transportation Needs: Not on file  Physical Activity: Not on file  Stress: Not on file  Social Connections: Unknown   Frequency of Communication with Friends and Family: More than three times a week   Frequency of Social Gatherings with Friends and Family: More than three times a week   Attends Religious Services: Not on file   Active Member of Clubs or Organizations: Not on file   Attends Banker Meetings: Not on file  Marital Status: Not on file  Intimate Partner Violence: Not on file    FAMILY HISTORY: Family History  Problem Relation Age of Onset   Diabetes Mother    Ovarian cancer Mother 5   Other Father        died in MVA at age 24   Other Sister        mitral valve disorder   Melanoma Sister 55       removed from back of leg   Colon cancer Brother        early 48s, succumbed to disease   Cancer Paternal Aunt        leukemia, bone, or lung cancer   Alzheimer's disease Maternal Grandmother    Heart attack Maternal Grandfather    Heart attack Paternal Grandmother    COPD Paternal Grandfather    Emphysema Paternal Grandfather    Congestive Heart Failure Paternal Aunt    Stomach cancer Paternal Uncle    Kidney cancer Maternal Uncle    Cancer Cousin        dx. teens/dx. 40s   Cancer Cousin    Colon cancer Cousin     ALLERGIES:  has No Known Allergies.  MEDICATIONS:  Current Outpatient Medications  Medication Sig Dispense Refill   albuterol (PROVENTIL HFA;VENTOLIN HFA) 108 (90 BASE) MCG/ACT inhaler Inhale 2 puffs into the lungs every 6 (six) hours as needed for wheezing or shortness of breath.      ALPRAZolam (XANAX) 0.5 MG tablet Take 0.5 mg by mouth 3 (three) times daily.     ANORO ELLIPTA 62.5-25 MCG/INH  AEPB Inhale 1 puff into the lungs daily.     aspirin EC 325 MG EC tablet Take 1 tablet (325 mg total) by mouth daily with breakfast. Must take at least 4 weeks postop for DVT prophylaxis (Patient not taking: Reported on 04/15/2021) 30 tablet 0   calcium carbonate (TUMS - DOSED IN MG ELEMENTAL CALCIUM) 500 MG chewable tablet Chew 2 tablets by mouth 3 (three) times daily as needed for indigestion or heartburn.     fluconazole (DIFLUCAN) 100 MG tablet Take 2 tablets today, then 1 tablet daily x 20 more days. 22 tablet 0   furosemide (LASIX) 20 MG tablet Take 20 mg by mouth daily.     hydrochlorothiazide (HYDRODIURIL) 12.5 MG tablet Take 12.5 mg by mouth daily.     ibuprofen (ADVIL) 800 MG tablet Take 800 mg by mouth every 8 (eight) hours as needed for moderate pain.     lidocaine (XYLOCAINE) 2 % solution Patient: Mix 1part 2% viscous lidocaine, 1part H20. Swallow 10mL of diluted mixture, before meals and at bedtime, up to QID for sore throat. 200 mL 3   Menthol, Topical Analgesic, (BIOFREEZE EX) Apply 1 application topically daily as needed (pain).     pantoprazole (PROTONIX) 40 MG tablet Take 1 tablet by mouth once daily 90 tablet 0   potassium chloride (KLOR-CON M) 10 MEQ tablet Take 10 mEq by mouth daily. Patient stated stated that she was started on 20 meq daily in August.     sertraline (ZOLOFT) 100 MG tablet Take 100 mg by mouth daily.     No current facility-administered medications for this visit.    PHYSICAL EXAMINATION: ECOG PERFORMANCE STATUS: 0 - Asymptomatic  Vitals:   11/01/21 0824  BP: 139/68  Pulse: (!) 58  Resp: 16  Temp: 98.1 F (36.7 C)  SpO2: 93%     Filed Weights   11/01/21 0824  Weight:  212 lb 9.6 oz (96.4 kg)    Physical Exam Constitutional:      Appearance: Normal appearance.     Comments: Hoarse voice from recent radiation  Cardiovascular:     Rate and Rhythm: Normal rate and regular rhythm.     Pulses: Normal pulses.     Heart sounds: Normal heart  sounds.  Pulmonary:     Effort: Pulmonary effort is normal.     Breath sounds: Normal breath sounds.  Musculoskeletal:        General: No swelling.     Cervical back: Normal range of motion and neck supple. No rigidity.  Lymphadenopathy:     Cervical: No cervical adenopathy.  Skin:    General: Skin is warm and dry.  Neurological:     General: No focal deficit present.     Mental Status: She is alert.  Psychiatric:        Mood and Affect: Mood normal.     LABORATORY DATA:  I have reviewed the data as listed Lab Results  Component Value Date   WBC 6.2 11/01/2021   HGB 13.9 11/01/2021   HCT 39.5 11/01/2021   MCV 86.6 11/01/2021   PLT 223 11/01/2021   Lab Results  Component Value Date   NA 142 11/01/2021   K 2.4 (LL) 11/01/2021   CL 99 11/01/2021   CO2 35 (H) 11/01/2021    RADIOGRAPHIC STUDIES: I have personally reviewed the radiological reports and agreed with the findings in the report.  ASSESSMENT AND PLAN:   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HCC) She completed definitive radiation and has been healing well.   No concerns currently. She appears to have had excellent clinical response  Carcinoma of upper-outer quadrant of left female breast No concern for recurrence of breast cancer at this time.  No significant role for antiestrogen therapy since she has completed 6 years.She is due for mammogram.  Screening mammogram ordered.  This is currently delayed given ongoing plans for concurrent chemoradiation  Squamous cell lung cancer (HCC) Cytology from left lower lobe lung nodule consistent with squamous cell carcinoma,. This was previously sent but concern was that fax may not have been delivered, resent today again. Given supraclavicular lymphadenopathy, I believe this is atleast stage IIIB lung cancer. We have discussed about proceeding with concurrent chemotherapy radiation with weekly CarboTaxol.  PD-L1 29% hence she is not a candidate for single agent  immunotherapy.  We will consider adjuvant immunotherapy after completion of concurrent chemoradiation for 1 year. We have on multiple occasions and again today discussed about side effects from chemotherapy including but not limited to fatigue, nausea, diarrhea/constipation, increased risk of infections, neuropathy etc.  She understands that some of the side effects can be life-threatening and permanent.  We once again discussed that sequential chemoradiation is certainly an option however may not be given with an intent to cure although in her case the cure rate is likely low even with definitive concurrent chemotherapy and radiation.  I once again stressed on this.  With regards to IV line, okay to place a port.  Orders placed.  For today we can proceed with peripheral IV if we can get good access. CBC satisfactory, CMP pending at the time of my visit.  A lot of reassurance given.  She is very very anxious, took a Xanax at home, may benefit from another dose of Ativan if she remains anxious.    Total time spent: 30 minutes  All questions were answered. The patient knows  to call the clinic with any problems, questions or concerns.    [name], MD 11/01/21

## 2021-11-01 ENCOUNTER — Inpatient Hospital Stay: Payer: Medicare HMO | Admitting: Nutrition

## 2021-11-01 ENCOUNTER — Inpatient Hospital Stay: Payer: Medicare HMO | Attending: Hematology and Oncology

## 2021-11-01 ENCOUNTER — Ambulatory Visit: Payer: Medicare HMO | Admitting: Physical Therapy

## 2021-11-01 ENCOUNTER — Encounter: Payer: Self-pay | Admitting: Hematology and Oncology

## 2021-11-01 ENCOUNTER — Inpatient Hospital Stay: Payer: Medicare HMO

## 2021-11-01 ENCOUNTER — Other Ambulatory Visit: Payer: Self-pay | Admitting: *Deleted

## 2021-11-01 ENCOUNTER — Telehealth: Payer: Self-pay | Admitting: *Deleted

## 2021-11-01 ENCOUNTER — Ambulatory Visit: Payer: Medicare HMO | Admitting: Radiation Oncology

## 2021-11-01 ENCOUNTER — Inpatient Hospital Stay: Payer: Medicare HMO | Admitting: Hematology and Oncology

## 2021-11-01 ENCOUNTER — Other Ambulatory Visit: Payer: Self-pay

## 2021-11-01 VITALS — BP 139/72 | HR 61 | Temp 98.7°F | Resp 18

## 2021-11-01 DIAGNOSIS — C321 Malignant neoplasm of supraglottis: Secondary | ICD-10-CM | POA: Insufficient documentation

## 2021-11-01 DIAGNOSIS — Z800 Family history of malignant neoplasm of digestive organs: Secondary | ICD-10-CM | POA: Insufficient documentation

## 2021-11-01 DIAGNOSIS — Z8041 Family history of malignant neoplasm of ovary: Secondary | ICD-10-CM | POA: Insufficient documentation

## 2021-11-01 DIAGNOSIS — T451X5A Adverse effect of antineoplastic and immunosuppressive drugs, initial encounter: Secondary | ICD-10-CM | POA: Insufficient documentation

## 2021-11-01 DIAGNOSIS — Z79899 Other long term (current) drug therapy: Secondary | ICD-10-CM | POA: Insufficient documentation

## 2021-11-01 DIAGNOSIS — C50412 Malignant neoplasm of upper-outer quadrant of left female breast: Secondary | ICD-10-CM | POA: Diagnosis not present

## 2021-11-01 DIAGNOSIS — I700 Atherosclerosis of aorta: Secondary | ICD-10-CM | POA: Insufficient documentation

## 2021-11-01 DIAGNOSIS — J449 Chronic obstructive pulmonary disease, unspecified: Secondary | ICD-10-CM | POA: Insufficient documentation

## 2021-11-01 DIAGNOSIS — Z9049 Acquired absence of other specified parts of digestive tract: Secondary | ICD-10-CM | POA: Diagnosis not present

## 2021-11-01 DIAGNOSIS — Z170 Estrogen receptor positive status [ER+]: Secondary | ICD-10-CM | POA: Diagnosis not present

## 2021-11-01 DIAGNOSIS — R0600 Dyspnea, unspecified: Secondary | ICD-10-CM | POA: Insufficient documentation

## 2021-11-01 DIAGNOSIS — Z5111 Encounter for antineoplastic chemotherapy: Secondary | ICD-10-CM | POA: Diagnosis not present

## 2021-11-01 DIAGNOSIS — Z808 Family history of malignant neoplasm of other organs or systems: Secondary | ICD-10-CM | POA: Insufficient documentation

## 2021-11-01 DIAGNOSIS — R5383 Other fatigue: Secondary | ICD-10-CM | POA: Diagnosis not present

## 2021-11-01 DIAGNOSIS — K521 Toxic gastroenteritis and colitis: Secondary | ICD-10-CM | POA: Insufficient documentation

## 2021-11-01 DIAGNOSIS — E876 Hypokalemia: Secondary | ICD-10-CM | POA: Insufficient documentation

## 2021-11-01 DIAGNOSIS — R682 Dry mouth, unspecified: Secondary | ICD-10-CM | POA: Diagnosis not present

## 2021-11-01 DIAGNOSIS — C3490 Malignant neoplasm of unspecified part of unspecified bronchus or lung: Secondary | ICD-10-CM

## 2021-11-01 DIAGNOSIS — Z923 Personal history of irradiation: Secondary | ICD-10-CM | POA: Diagnosis not present

## 2021-11-01 DIAGNOSIS — R110 Nausea: Secondary | ICD-10-CM | POA: Diagnosis not present

## 2021-11-01 DIAGNOSIS — B370 Candidal stomatitis: Secondary | ICD-10-CM | POA: Insufficient documentation

## 2021-11-01 DIAGNOSIS — Z79811 Long term (current) use of aromatase inhibitors: Secondary | ICD-10-CM | POA: Diagnosis not present

## 2021-11-01 DIAGNOSIS — Z833 Family history of diabetes mellitus: Secondary | ICD-10-CM | POA: Insufficient documentation

## 2021-11-01 DIAGNOSIS — Z87891 Personal history of nicotine dependence: Secondary | ICD-10-CM | POA: Insufficient documentation

## 2021-11-01 DIAGNOSIS — C3432 Malignant neoplasm of lower lobe, left bronchus or lung: Secondary | ICD-10-CM | POA: Diagnosis present

## 2021-11-01 DIAGNOSIS — R490 Dysphonia: Secondary | ICD-10-CM | POA: Diagnosis not present

## 2021-11-01 DIAGNOSIS — Z836 Family history of other diseases of the respiratory system: Secondary | ICD-10-CM | POA: Insufficient documentation

## 2021-11-01 DIAGNOSIS — Z9221 Personal history of antineoplastic chemotherapy: Secondary | ICD-10-CM | POA: Insufficient documentation

## 2021-11-01 DIAGNOSIS — Z818 Family history of other mental and behavioral disorders: Secondary | ICD-10-CM | POA: Insufficient documentation

## 2021-11-01 DIAGNOSIS — Z8249 Family history of ischemic heart disease and other diseases of the circulatory system: Secondary | ICD-10-CM | POA: Insufficient documentation

## 2021-11-01 DIAGNOSIS — Z8051 Family history of malignant neoplasm of kidney: Secondary | ICD-10-CM | POA: Insufficient documentation

## 2021-11-01 LAB — COMPREHENSIVE METABOLIC PANEL
ALT: 6 U/L (ref 0–44)
AST: 10 U/L — ABNORMAL LOW (ref 15–41)
Albumin: 3.7 g/dL (ref 3.5–5.0)
Alkaline Phosphatase: 79 U/L (ref 38–126)
Anion gap: 8 (ref 5–15)
BUN: 13 mg/dL (ref 8–23)
CO2: 35 mmol/L — ABNORMAL HIGH (ref 22–32)
Calcium: 9.5 mg/dL (ref 8.9–10.3)
Chloride: 99 mmol/L (ref 98–111)
Creatinine, Ser: 0.78 mg/dL (ref 0.44–1.00)
GFR, Estimated: >60 mL/min (ref >60)
Glucose, Bld: 126 mg/dL — ABNORMAL HIGH (ref 70–99)
Potassium: 2.4 mmol/L — CL (ref 3.5–5.1)
Sodium: 142 mmol/L (ref 135–145)
Total Bilirubin: 1 mg/dL (ref 0.3–1.2)
Total Protein: 7 g/dL (ref 6.5–8.1)

## 2021-11-01 LAB — CBC WITH DIFFERENTIAL/PLATELET
Abs Immature Granulocytes: 0.02 10*3/uL (ref 0.00–0.07)
Basophils Absolute: 0 10*3/uL (ref 0.0–0.1)
Basophils Relative: 1 %
Eosinophils Absolute: 0.3 10*3/uL (ref 0.0–0.5)
Eosinophils Relative: 5 %
HCT: 39.5 % (ref 36.0–46.0)
Hemoglobin: 13.9 g/dL (ref 12.0–15.0)
Immature Granulocytes: 0 %
Lymphocytes Relative: 15 %
Lymphs Abs: 0.9 10*3/uL (ref 0.7–4.0)
MCH: 30.5 pg (ref 26.0–34.0)
MCHC: 35.2 g/dL (ref 30.0–36.0)
MCV: 86.6 fL (ref 80.0–100.0)
Monocytes Absolute: 0.3 10*3/uL (ref 0.1–1.0)
Monocytes Relative: 5 %
Neutro Abs: 4.6 10*3/uL (ref 1.7–7.7)
Neutrophils Relative %: 74 %
Platelets: 223 10*3/uL (ref 150–400)
RBC: 4.56 MIL/uL (ref 3.87–5.11)
RDW: 12.5 % (ref 11.5–15.5)
WBC: 6.2 10*3/uL (ref 4.0–10.5)
nRBC: 0 % (ref 0.0–0.2)

## 2021-11-01 LAB — CEA (IN HOUSE-CHCC): CEA (CHCC-In House): 4.53 ng/mL (ref 0.00–5.00)

## 2021-11-01 MED ORDER — PALONOSETRON HCL INJECTION 0.25 MG/5ML
0.2500 mg | Freq: Once | INTRAVENOUS | Status: AC
  Administered 2021-11-01: 0.25 mg via INTRAVENOUS
  Filled 2021-11-01: qty 5

## 2021-11-01 MED ORDER — DIPHENHYDRAMINE HCL 50 MG/ML IJ SOLN
50.0000 mg | Freq: Once | INTRAMUSCULAR | Status: AC
  Administered 2021-11-01: 50 mg via INTRAVENOUS
  Filled 2021-11-01: qty 1

## 2021-11-01 MED ORDER — SODIUM CHLORIDE 0.9 % IV SOLN
214.4000 mg | Freq: Once | INTRAVENOUS | Status: AC
  Administered 2021-11-01: 210 mg via INTRAVENOUS
  Filled 2021-11-01: qty 21

## 2021-11-01 MED ORDER — SODIUM CHLORIDE 0.9 % IV SOLN: Freq: Once | INTRAVENOUS | Status: AC

## 2021-11-01 MED ORDER — SODIUM CHLORIDE 0.9 % IV SOLN
45.0000 mg/m2 | Freq: Once | INTRAVENOUS | Status: AC
  Administered 2021-11-01: 96 mg via INTRAVENOUS
  Filled 2021-11-01: qty 16

## 2021-11-01 MED ORDER — SODIUM CHLORIDE 0.9 % IV SOLN
10.0000 mg | Freq: Once | INTRAVENOUS | Status: AC
  Administered 2021-11-01: 10 mg via INTRAVENOUS
  Filled 2021-11-01: qty 10

## 2021-11-01 MED ORDER — FAMOTIDINE IN NACL 20-0.9 MG/50ML-% IV SOLN
20.0000 mg | Freq: Once | INTRAVENOUS | Status: AC
  Administered 2021-11-01: 20 mg via INTRAVENOUS
  Filled 2021-11-01: qty 50

## 2021-11-01 NOTE — Progress Notes (Signed)
Nutrition follow-up completed with patient during infusion for new lung cancer diagnosis.  Patient completed treatment for head and neck cancer with final radiation therapy on February 20. ? ?Weight stable overall and documented as 212 pounds, 10 ounces on April 10.  Patient weighed 214 pounds on March 7. ?Patient denies nutrition impact symptoms of nausea, vomiting, constipation, or diarrhea.  She reports her throat is still sore after radiation therapy.  She continues to tolerate soft foods and is not drinking oral nutrition supplements at this time.  Patient very sleepy from Benadryl given.  She has no nutrition related questions. ? ?Nutrition diagnosis: Unintended weight loss is stable. ? ?Intervention: ?Educated patient to continue soft high-protein foods in small amounts throughout the day. ?Reminded patient about adding Carnation breakfast essentials to milk for additional protein required. ?Educated patient to communicate nutrition impact symptoms with physician if needed. ? ?Monitoring, evaluation, goals: ?Patient will tolerate adequate calories and protein to minimize weight loss throughout treatment. ? ?Next visit: ?Tuesday, April 25 during infusion. ? ?**Disclaimer: This note was dictated with voice recognition software. Similar sounding words can inadvertently be transcribed and this note may contain transcription errors which may not have been corrected upon publication of note.** ? ?

## 2021-11-01 NOTE — Assessment & Plan Note (Signed)
Cytology from left lower lobe lung nodule consistent with squamous cell carcinoma,. This was previously sent but concern was that fax may not have been delivered, resent today again. ?Given supraclavicular lymphadenopathy, I believe this is atleast stage IIIB lung cancer. ?We have discussed about proceeding with concurrent chemotherapy radiation with weekly CarboTaxol.  PD-L1 29% hence she is not a candidate for single agent immunotherapy.  We will consider adjuvant immunotherapy after completion of concurrent chemoradiation for 1 year. ?We have on multiple occasions and again today discussed about side effects from chemotherapy including but not limited to fatigue, nausea, diarrhea/constipation, increased risk of infections, neuropathy etc.  She understands that some of the side effects can be life-threatening and permanent.  We once again discussed that sequential chemoradiation is certainly an option however may not be given with an intent to cure although in her case the cure rate is likely low even with definitive concurrent chemotherapy and radiation.  I once again stressed on this.  With regards to IV line, okay to place a port.  Orders placed.  For today we can proceed with peripheral IV if we can get good access. ?CBC satisfactory, CMP pending at the time of my visit.  A lot of reassurance given.  She is very very anxious, took a Xanax at home, may benefit from another dose of Ativan if she remains anxious. ?

## 2021-11-01 NOTE — Addendum Note (Signed)
Addended by: Adaline Sill on: 11/01/2021 09:12 AM ? ? Modules accepted: Orders ? ?

## 2021-11-01 NOTE — Assessment & Plan Note (Signed)
She completed definitive radiation and has been healing well.   ?No concerns currently. ?She appears to have had excellent clinical response ?

## 2021-11-01 NOTE — Assessment & Plan Note (Signed)
No concern for recurrence of breast cancer at this time.  No significant role for antiestrogen therapy since she has completed 6 years.She is due for mammogram.  Screening mammogram ordered.  ?This is currently delayed given ongoing plans for concurrent chemoradiation ?

## 2021-11-01 NOTE — Addendum Note (Signed)
Addended by: Adaline Sill on: 11/01/2021 12:42 PM ? ? Modules accepted: Orders ? ?

## 2021-11-01 NOTE — Telephone Encounter (Signed)
Pt seen by MD today with need to start chemo today as scheduled with peripheral IV with ok per MD to use right arm for access ( note remote history of left breast cancer with bilateral lymph node dissection with 2 nodes removed on right ). ? ?Noted as well is potassium of 2.4 - pt states she is supposed to be on potassium but has not been taking it due to discomfort in swallowing. ? ?Per discussion pt will re institute home potassium. ? ? ?

## 2021-11-01 NOTE — Patient Instructions (Signed)
[facility]  Discharge Instructions: Thank you for choosing [facility] to provide your oncology and hematology care.   If you have a lab appointment with the Cancer Center, please go directly to the Cancer Center and check in at the registration area.   Wear comfortable clothing and clothing appropriate for easy access to any Portacath or PICC line.   We strive to give you quality time with your provider. You may need to reschedule your appointment if you arrive late (15 or more minutes).  Arriving late affects you and other patients whose appointments are after yours.  Also, if you miss three or more appointments without notifying the office, you may be dismissed from the clinic at the provider's discretion.      For prescription refill requests, have your pharmacy contact our office and allow 72 hours for refills to be completed.    Today you received the following chemotherapy and/or immunotherapy agents paclitaxel, carboplatin      To help prevent nausea and vomiting after your treatment, we encourage you to take your nausea medication as directed.  BELOW ARE SYMPTOMS THAT SHOULD BE REPORTED IMMEDIATELY: *FEVER GREATER THAN 100.4 F (38 C) OR HIGHER *CHILLS OR SWEATING *NAUSEA AND VOMITING THAT IS NOT CONTROLLED WITH YOUR NAUSEA MEDICATION *UNUSUAL SHORTNESS OF BREATH *UNUSUAL BRUISING OR BLEEDING *URINARY PROBLEMS (pain or burning when urinating, or frequent urination) *BOWEL PROBLEMS (unusual diarrhea, constipation, pain near the anus) TENDERNESS IN MOUTH AND THROAT WITH OR WITHOUT PRESENCE OF ULCERS (sore throat, sores in mouth, or a toothache) UNUSUAL RASH, SWELLING OR PAIN  UNUSUAL VAGINAL DISCHARGE OR ITCHING   Items with * indicate a potential emergency and should be followed up as soon as possible or go to the Emergency Department if any problems should occur.  Please show the CHEMOTHERAPY ALERT CARD or IMMUNOTHERAPY ALERT CARD at  check-in to the Emergency Department and triage nurse.  Should you have questions after your visit or need to cancel or reschedule your appointment, please contact [facility]  Dept: [phone]  and follow the prompts.  Office hours are 8:00 a.m. to 4:30 p.m. Monday - Friday. Please note that voicemails left after 4:00 p.m. may not be returned until the following business day.  We are closed weekends and major holidays. You have access to a nurse at all times for urgent questions. Please call the main number to the clinic Dept: [phone] and follow the prompts.   For any non-urgent questions, you may also contact your provider using MyChart. We now offer e-Visits for anyone 58 and older to request care online for non-urgent symptoms. For details visit [email or web address].   Also download the MyChart app! Go to the app store, search "MyChart", open the app, select [place], and log in with your MyChart username and password.  Due to Covid, a mask is required upon entering the hospital/clinic. If you do not have a mask, one will be given to you upon arrival. For doctor visits, patients may have 1 support person aged 70 or older with them. For treatment visits, patients cannot have anyone with them due to current Covid guidelines and our immunocompromised population.  Paclitaxel injection What is this medication? PACLITAXEL (PAK li TAX el) is a chemotherapy drug. It targets fast dividing cells, like cancer cells, and causes these cells to die. This medicine is used to treat ovarian cancer, breast cancer, lung cancer, Kaposi's sarcoma, and other cancers. This medicine may  be used for other purposes; ask your health care provider or pharmacist if you have questions. COMMON BRAND NAME(S): Onxol, Taxol What should I tell my care team before I take this medication? They need to know if you have any of these conditions: history of irregular heartbeat liver  disease low blood counts, like low white cell, platelet, or red cell counts lung or breathing disease, like asthma tingling of the fingers or toes, or other nerve disorder an unusual or allergic reaction to paclitaxel, alcohol, polyoxyethylated castor oil, other chemotherapy, other medicines, foods, dyes, or preservatives pregnant or trying to get pregnant breast-feeding How should I use this medication? This drug is given as an infusion into a vein. It is administered in a hospital or clinic by a specially trained health care professional. Talk to your pediatrician regarding the use of this medicine in children. Special care may be needed. Overdosage: If you think you have taken too much of this medicine contact a poison control center or emergency room at once. NOTE: This medicine is only for you. Do not share this medicine with others. What if I miss a dose? It is important not to miss your dose. Call your doctor or health care professional if you are unable to keep an appointment. What may interact with this medication? Do not take this medicine with any of the following medications: live virus vaccines This medicine may also interact with the following medications: antiviral medicines for hepatitis, HIV or AIDS certain antibiotics like erythromycin and clarithromycin certain medicines for fungal infections like ketoconazole and itraconazole certain medicines for seizures like carbamazepine, phenobarbital, phenytoin gemfibrozil nefazodone rifampin St. John's wort This list may not describe all possible interactions. Give your health care provider a list of all the medicines, herbs, non-prescription drugs, or dietary supplements you use. Also tell them if you smoke, drink alcohol, or use illegal drugs. Some items may interact with your medicine. What should I watch for while using this medication? Your condition will be monitored carefully while you are receiving this medicine. You  will need important blood work done while you are taking this medicine. This medicine can cause serious allergic reactions. To reduce your risk you will need to take other medicine(s) before treatment with this medicine. If you experience allergic reactions like skin rash, itching or hives, swelling of the face, lips, or tongue, tell your doctor or health care professional right away. In some cases, you may be given additional medicines to help with side effects. Follow all directions for their use. This drug may make you feel generally unwell. This is not uncommon, as chemotherapy can affect healthy cells as well as cancer cells. Report any side effects. Continue your course of treatment even though you feel ill unless your doctor tells you to stop. Call your doctor or health care professional for advice if you get a fever, chills or sore throat, or other symptoms of a cold or flu. Do not treat yourself. This drug decreases your body's ability to fight infections. Try to avoid being around people who are sick. This medicine may increase your risk to bruise or bleed. Call your doctor or health care professional if you notice any unusual bleeding. Be careful brushing and flossing your teeth or using a toothpick because you may get an infection or bleed more easily. If you have any dental work done, tell your dentist you are receiving this medicine. Avoid taking products that contain aspirin, acetaminophen, ibuprofen, naproxen, or ketoprofen unless instructed by your  doctor. These medicines may hide a fever. Do not become pregnant while taking this medicine. Women should inform their doctor if they wish to become pregnant or think they might be pregnant. There is a potential for serious side effects to an unborn child. Talk to your health care professional or pharmacist for more information. Do not breast-feed an infant while taking this medicine. Men are advised not to father a child while receiving this  medicine. This product may contain alcohol. Ask your pharmacist or healthcare provider if this medicine contains alcohol. Be sure to tell all healthcare providers you are taking this medicine. Certain medicines, like metronidazole and disulfiram, can cause an unpleasant reaction when taken with alcohol. The reaction includes flushing, headache, nausea, vomiting, sweating, and increased thirst. The reaction can last from 30 minutes to several hours. What side effects may I notice from receiving this medication? Side effects that you should report to your doctor or health care professional as soon as possible: allergic reactions like skin rash, itching or hives, swelling of the face, lips, or tongue breathing problems changes in vision fast, irregular heartbeat high or low blood pressure mouth sores pain, tingling, numbness in the hands or feet signs of decreased platelets or bleeding - bruising, pinpoint red spots on the skin, black, tarry stools, blood in the urine signs of decreased red blood cells - unusually weak or tired, feeling faint or lightheaded, falls signs of infection - fever or chills, cough, sore throat, pain or difficulty passing urine signs and symptoms of liver injury like dark yellow or brown urine; general ill feeling or flu-like symptoms; light-colored stools; loss of appetite; nausea; right upper belly pain; unusually weak or tired; yellowing of the eyes or skin swelling of the ankles, feet, hands unusually slow heartbeat Side effects that usually do not require medical attention (report to your doctor or health care professional if they continue or are bothersome): diarrhea hair loss loss of appetite muscle or joint pain nausea, vomiting pain, redness, or irritation at site where injected tiredness This list may not describe all possible side effects. Call your doctor for medical advice about side effects. You may report side effects to FDA at 1-800-FDA-1088. Where  should I keep my medication? This drug is given in a hospital or clinic and will not be stored at home. NOTE: This sheet is a summary. It may not cover all possible information. If you have questions about this medicine, talk to your doctor, pharmacist, or health care provider.  2022 Elsevier/Gold Standard (2021-03-30 00:00:00) Carboplatin injection What is this medication? CARBOPLATIN (KAR boe pla tin) is a chemotherapy drug. It targets fast dividing cells, like cancer cells, and causes these cells to die. This medicine is used to treat ovarian cancer and many other cancers. This medicine may be used for other purposes; ask your health care provider or pharmacist if you have questions. COMMON BRAND NAME(S): Paraplatin What should I tell my care team before I take this medication? They need to know if you have any of these conditions: blood disorders hearing problems kidney disease recent or ongoing radiation therapy an unusual or allergic reaction to carboplatin, cisplatin, other chemotherapy, other medicines, foods, dyes, or preservatives pregnant or trying to get pregnant breast-feeding How should I use this medication? This drug is usually given as an infusion into a vein. It is administered in a hospital or clinic by a specially trained health care professional. Talk to your pediatrician regarding the use of this medicine in children. Special  care may be needed. Overdosage: If you think you have taken too much of this medicine contact a poison control center or emergency room at once. NOTE: This medicine is only for you. Do not share this medicine with others. What if I miss a dose? It is important not to miss a dose. Call your doctor or health care professional if you are unable to keep an appointment. What may interact with this medication? medicines for seizures medicines to increase blood counts like filgrastim, pegfilgrastim, sargramostim some antibiotics like amikacin,  gentamicin, neomycin, streptomycin, tobramycin vaccines Talk to your doctor or health care professional before taking any of these medicines: acetaminophen aspirin ibuprofen ketoprofen naproxen This list may not describe all possible interactions. Give your health care provider a list of all the medicines, herbs, non-prescription drugs, or dietary supplements you use. Also tell them if you smoke, drink alcohol, or use illegal drugs. Some items may interact with your medicine. What should I watch for while using this medication? Your condition will be monitored carefully while you are receiving this medicine. You will need important blood work done while you are taking this medicine. This drug may make you feel generally unwell. This is not uncommon, as chemotherapy can affect healthy cells as well as cancer cells. Report any side effects. Continue your course of treatment even though you feel ill unless your doctor tells you to stop. In some cases, you may be given additional medicines to help with side effects. Follow all directions for their use. Call your doctor or health care professional for advice if you get a fever, chills or sore throat, or other symptoms of a cold or flu. Do not treat yourself. This drug decreases your body's ability to fight infections. Try to avoid being around people who are sick. This medicine may increase your risk to bruise or bleed. Call your doctor or health care professional if you notice any unusual bleeding. Be careful brushing and flossing your teeth or using a toothpick because you may get an infection or bleed more easily. If you have any dental work done, tell your dentist you are receiving this medicine. Avoid taking products that contain aspirin, acetaminophen, ibuprofen, naproxen, or ketoprofen unless instructed by your doctor. These medicines may hide a fever. Do not become pregnant while taking this medicine. Women should inform their doctor if they wish  to become pregnant or think they might be pregnant. There is a potential for serious side effects to an unborn child. Talk to your health care professional or pharmacist for more information. Do not breast-feed an infant while taking this medicine. What side effects may I notice from receiving this medication? Side effects that you should report to your doctor or health care professional as soon as possible: allergic reactions like skin rash, itching or hives, swelling of the face, lips, or tongue signs of infection - fever or chills, cough, sore throat, pain or difficulty passing urine signs of decreased platelets or bleeding - bruising, pinpoint red spots on the skin, black, tarry stools, nosebleeds signs of decreased red blood cells - unusually weak or tired, fainting spells, lightheadedness breathing problems changes in hearing changes in vision chest pain high blood pressure low blood counts - This drug may decrease the number of white blood cells, red blood cells and platelets. You may be at increased risk for infections and bleeding. nausea and vomiting pain, swelling, redness or irritation at the injection site pain, tingling, numbness in the hands or feet problems with  balance, talking, walking trouble passing urine or change in the amount of urine Side effects that usually do not require medical attention (report to your doctor or health care professional if they continue or are bothersome): hair loss loss of appetite metallic taste in the mouth or changes in taste This list may not describe all possible side effects. Call your doctor for medical advice about side effects. You may report side effects to FDA at 1-800-FDA-1088. Where should I keep my medication? This drug is given in a hospital or clinic and will not be stored at home. NOTE: This sheet is a summary. It may not cover all possible information. If you have questions about this medicine, talk to your doctor, pharmacist,  or health care provider.  2022 Elsevier/Gold Standard (2007-12-19 00:00:00)

## 2021-11-02 ENCOUNTER — Inpatient Hospital Stay: Payer: Medicare HMO | Admitting: Nurse Practitioner

## 2021-11-02 ENCOUNTER — Ambulatory Visit: Payer: Medicare HMO | Admitting: Radiation Oncology

## 2021-11-02 ENCOUNTER — Other Ambulatory Visit: Payer: Self-pay | Admitting: *Deleted

## 2021-11-02 DIAGNOSIS — Z87891 Personal history of nicotine dependence: Secondary | ICD-10-CM | POA: Diagnosis not present

## 2021-11-02 DIAGNOSIS — C320 Malignant neoplasm of glottis: Secondary | ICD-10-CM | POA: Diagnosis not present

## 2021-11-02 DIAGNOSIS — Z510 Encounter for antineoplastic radiation therapy: Secondary | ICD-10-CM | POA: Insufficient documentation

## 2021-11-02 DIAGNOSIS — C3432 Malignant neoplasm of lower lobe, left bronchus or lung: Secondary | ICD-10-CM | POA: Diagnosis not present

## 2021-11-03 ENCOUNTER — Telehealth: Payer: Self-pay | Admitting: *Deleted

## 2021-11-03 ENCOUNTER — Other Ambulatory Visit: Payer: Self-pay

## 2021-11-03 ENCOUNTER — Ambulatory Visit
Admission: RE | Admit: 2021-11-03 | Discharge: 2021-11-03 | Disposition: A | Discharge status: Home / Self Care | Payer: Medicare HMO | Source: Ambulatory Visit | Attending: Radiation Oncology | Admitting: Radiation Oncology

## 2021-11-03 DIAGNOSIS — Z510 Encounter for antineoplastic radiation therapy: Secondary | ICD-10-CM | POA: Diagnosis not present

## 2021-11-03 DIAGNOSIS — C320 Malignant neoplasm of glottis: Secondary | ICD-10-CM | POA: Diagnosis not present

## 2021-11-03 DIAGNOSIS — C3432 Malignant neoplasm of lower lobe, left bronchus or lung: Secondary | ICD-10-CM | POA: Diagnosis not present

## 2021-11-03 DIAGNOSIS — Z87891 Personal history of nicotine dependence: Secondary | ICD-10-CM | POA: Diagnosis not present

## 2021-11-03 NOTE — Telephone Encounter (Signed)
This RN spoke with the pt's sister per her call - inquiring " is the port placement really needed " ? ?Regina Mcmahon states IV stick on Monday went so well " they were able to get it on the first stick that Regina Mcmahon was thinking she would not need the port " ? ?This RN discussed above verifying decision for a port is ultimately her choice- but the concern is for potential for lymphedema due to remote history of lymph node dissection. ? ?This RN discussed potential for delay in treatment if IV could not be obtained. ? ?Regina Mcmahon stated concern for pt " is this just too much for her since she just completed the radiation recently for her head and neck diagnosis ?" ? ?This RN validated concern per above with re iteration that presently goal of therapy for lung cancer is curative. ? ?Regina Mcmahon stated understanding and will let Regina Mcmahon know above - this RN did offer if needed for pt to speak with this RN if needed for further concerns. ?

## 2021-11-04 ENCOUNTER — Other Ambulatory Visit: Payer: Self-pay | Admitting: *Deleted

## 2021-11-04 ENCOUNTER — Ambulatory Visit
Admission: RE | Admit: 2021-11-04 | Discharge: 2021-11-04 | Disposition: A | Discharge status: Home / Self Care | Payer: Medicare HMO | Source: Ambulatory Visit | Attending: Radiation Oncology | Admitting: Radiation Oncology

## 2021-11-04 ENCOUNTER — Telehealth: Payer: Self-pay | Admitting: *Deleted

## 2021-11-04 DIAGNOSIS — C3490 Malignant neoplasm of unspecified part of unspecified bronchus or lung: Secondary | ICD-10-CM

## 2021-11-04 DIAGNOSIS — C3432 Malignant neoplasm of lower lobe, left bronchus or lung: Secondary | ICD-10-CM | POA: Diagnosis not present

## 2021-11-04 DIAGNOSIS — Z510 Encounter for antineoplastic radiation therapy: Secondary | ICD-10-CM | POA: Diagnosis not present

## 2021-11-04 DIAGNOSIS — C320 Malignant neoplasm of glottis: Secondary | ICD-10-CM | POA: Diagnosis not present

## 2021-11-04 DIAGNOSIS — Z87891 Personal history of nicotine dependence: Secondary | ICD-10-CM | POA: Diagnosis not present

## 2021-11-04 DIAGNOSIS — Z79899 Other long term (current) drug therapy: Secondary | ICD-10-CM

## 2021-11-04 MED ORDER — POTASSIUM CHLORIDE ER 10 MEQ PO CPCR
20.0000 meq | ORAL_CAPSULE | Freq: Two times a day (BID) | ORAL | 2 refills | Status: DC
Start: 2021-11-04 — End: 2021-12-29

## 2021-11-04 NOTE — Telephone Encounter (Signed)
Spoke with the patient and explained concern regarding repeated sticks in arm with hx of lymph node dissection and potential lymphedema/cellulitis with pt stating understanding and agreement to placement of port. ? ?Per contact with Tiffany in IR- slot was still available- will schedule- new order needed for procedure. ? ?New order placed per above. ?

## 2021-11-05 ENCOUNTER — Other Ambulatory Visit: Payer: Self-pay | Admitting: Radiology

## 2021-11-05 ENCOUNTER — Ambulatory Visit
Admission: RE | Admit: 2021-11-05 | Discharge: 2021-11-05 | Disposition: A | Discharge status: Home / Self Care | Payer: Medicare HMO | Source: Ambulatory Visit | Attending: Radiation Oncology | Admitting: Radiation Oncology

## 2021-11-05 ENCOUNTER — Other Ambulatory Visit: Payer: Self-pay | Admitting: *Deleted

## 2021-11-05 ENCOUNTER — Other Ambulatory Visit: Payer: Self-pay

## 2021-11-05 DIAGNOSIS — Z510 Encounter for antineoplastic radiation therapy: Secondary | ICD-10-CM | POA: Diagnosis not present

## 2021-11-05 DIAGNOSIS — C3432 Malignant neoplasm of lower lobe, left bronchus or lung: Secondary | ICD-10-CM | POA: Diagnosis not present

## 2021-11-05 DIAGNOSIS — Z87891 Personal history of nicotine dependence: Secondary | ICD-10-CM | POA: Diagnosis not present

## 2021-11-05 DIAGNOSIS — C320 Malignant neoplasm of glottis: Secondary | ICD-10-CM | POA: Diagnosis not present

## 2021-11-08 ENCOUNTER — Ambulatory Visit
Admission: RE | Admit: 2021-11-08 | Discharge: 2021-11-08 | Disposition: A | Discharge status: Home / Self Care | Payer: Medicare HMO | Source: Ambulatory Visit | Attending: Radiation Oncology | Admitting: Radiation Oncology

## 2021-11-08 ENCOUNTER — Inpatient Hospital Stay: Payer: Medicare HMO

## 2021-11-08 ENCOUNTER — Ambulatory Visit (HOSPITAL_COMMUNITY)
Admission: RE | Admit: 2021-11-08 | Discharge: 2021-11-08 | Disposition: A | Discharge status: Home / Self Care | Payer: Medicare HMO | Source: Ambulatory Visit | Attending: Hematology and Oncology | Admitting: Hematology and Oncology

## 2021-11-08 ENCOUNTER — Inpatient Hospital Stay: Payer: Medicare HMO | Admitting: Hematology and Oncology

## 2021-11-08 ENCOUNTER — Telehealth: Payer: Self-pay | Admitting: *Deleted

## 2021-11-08 ENCOUNTER — Other Ambulatory Visit (HOSPITAL_COMMUNITY): Payer: Medicare HMO

## 2021-11-08 ENCOUNTER — Other Ambulatory Visit: Payer: Self-pay

## 2021-11-08 ENCOUNTER — Ambulatory Visit (HOSPITAL_COMMUNITY): Payer: Medicare HMO

## 2021-11-08 ENCOUNTER — Other Ambulatory Visit: Payer: Medicare HMO

## 2021-11-08 ENCOUNTER — Encounter (HOSPITAL_COMMUNITY): Payer: Self-pay

## 2021-11-08 DIAGNOSIS — I10 Essential (primary) hypertension: Secondary | ICD-10-CM | POA: Insufficient documentation

## 2021-11-08 DIAGNOSIS — Z5111 Encounter for antineoplastic chemotherapy: Secondary | ICD-10-CM | POA: Diagnosis not present

## 2021-11-08 DIAGNOSIS — R110 Nausea: Secondary | ICD-10-CM | POA: Diagnosis not present

## 2021-11-08 DIAGNOSIS — E876 Hypokalemia: Secondary | ICD-10-CM | POA: Diagnosis not present

## 2021-11-08 DIAGNOSIS — R490 Dysphonia: Secondary | ICD-10-CM | POA: Diagnosis not present

## 2021-11-08 DIAGNOSIS — Z87891 Personal history of nicotine dependence: Secondary | ICD-10-CM | POA: Diagnosis not present

## 2021-11-08 DIAGNOSIS — K219 Gastro-esophageal reflux disease without esophagitis: Secondary | ICD-10-CM | POA: Diagnosis not present

## 2021-11-08 DIAGNOSIS — C321 Malignant neoplasm of supraglottis: Secondary | ICD-10-CM | POA: Diagnosis not present

## 2021-11-08 DIAGNOSIS — C320 Malignant neoplasm of glottis: Secondary | ICD-10-CM | POA: Diagnosis not present

## 2021-11-08 DIAGNOSIS — C3492 Malignant neoplasm of unspecified part of left bronchus or lung: Secondary | ICD-10-CM | POA: Diagnosis not present

## 2021-11-08 DIAGNOSIS — Z510 Encounter for antineoplastic radiation therapy: Secondary | ICD-10-CM | POA: Diagnosis not present

## 2021-11-08 DIAGNOSIS — Z170 Estrogen receptor positive status [ER+]: Secondary | ICD-10-CM | POA: Diagnosis not present

## 2021-11-08 DIAGNOSIS — Z923 Personal history of irradiation: Secondary | ICD-10-CM | POA: Diagnosis not present

## 2021-11-08 DIAGNOSIS — Z452 Encounter for adjustment and management of vascular access device: Secondary | ICD-10-CM | POA: Diagnosis not present

## 2021-11-08 DIAGNOSIS — C3432 Malignant neoplasm of lower lobe, left bronchus or lung: Secondary | ICD-10-CM | POA: Diagnosis not present

## 2021-11-08 DIAGNOSIS — I700 Atherosclerosis of aorta: Secondary | ICD-10-CM | POA: Diagnosis not present

## 2021-11-08 DIAGNOSIS — C50412 Malignant neoplasm of upper-outer quadrant of left female breast: Secondary | ICD-10-CM | POA: Diagnosis not present

## 2021-11-08 DIAGNOSIS — Z9049 Acquired absence of other specified parts of digestive tract: Secondary | ICD-10-CM | POA: Diagnosis not present

## 2021-11-08 DIAGNOSIS — Z9221 Personal history of antineoplastic chemotherapy: Secondary | ICD-10-CM | POA: Diagnosis not present

## 2021-11-08 DIAGNOSIS — C3490 Malignant neoplasm of unspecified part of unspecified bronchus or lung: Secondary | ICD-10-CM

## 2021-11-08 DIAGNOSIS — R69 Illness, unspecified: Secondary | ICD-10-CM | POA: Diagnosis not present

## 2021-11-08 DIAGNOSIS — R7303 Prediabetes: Secondary | ICD-10-CM | POA: Diagnosis not present

## 2021-11-08 DIAGNOSIS — Z833 Family history of diabetes mellitus: Secondary | ICD-10-CM | POA: Diagnosis not present

## 2021-11-08 DIAGNOSIS — R0600 Dyspnea, unspecified: Secondary | ICD-10-CM | POA: Diagnosis not present

## 2021-11-08 DIAGNOSIS — R682 Dry mouth, unspecified: Secondary | ICD-10-CM | POA: Diagnosis not present

## 2021-11-08 DIAGNOSIS — Z79899 Other long term (current) drug therapy: Secondary | ICD-10-CM | POA: Diagnosis not present

## 2021-11-08 DIAGNOSIS — Z79811 Long term (current) use of aromatase inhibitors: Secondary | ICD-10-CM | POA: Diagnosis not present

## 2021-11-08 DIAGNOSIS — K521 Toxic gastroenteritis and colitis: Secondary | ICD-10-CM | POA: Diagnosis not present

## 2021-11-08 DIAGNOSIS — B370 Candidal stomatitis: Secondary | ICD-10-CM | POA: Diagnosis not present

## 2021-11-08 DIAGNOSIS — J449 Chronic obstructive pulmonary disease, unspecified: Secondary | ICD-10-CM | POA: Diagnosis not present

## 2021-11-08 DIAGNOSIS — R5383 Other fatigue: Secondary | ICD-10-CM | POA: Diagnosis not present

## 2021-11-08 DIAGNOSIS — D491 Neoplasm of unspecified behavior of respiratory system: Secondary | ICD-10-CM | POA: Insufficient documentation

## 2021-11-08 DIAGNOSIS — Z8041 Family history of malignant neoplasm of ovary: Secondary | ICD-10-CM | POA: Diagnosis not present

## 2021-11-08 DIAGNOSIS — F32A Depression, unspecified: Secondary | ICD-10-CM | POA: Diagnosis not present

## 2021-11-08 DIAGNOSIS — T451X5A Adverse effect of antineoplastic and immunosuppressive drugs, initial encounter: Secondary | ICD-10-CM | POA: Diagnosis not present

## 2021-11-08 HISTORY — PX: IR IMAGING GUIDED PORT INSERTION: IMG5740

## 2021-11-08 HISTORY — DX: Prediabetes: R73.03

## 2021-11-08 HISTORY — DX: Dyspnea, unspecified: R06.00

## 2021-11-08 LAB — COMPREHENSIVE METABOLIC PANEL
ALT: 7 U/L (ref 0–44)
AST: 10 U/L — ABNORMAL LOW (ref 15–41)
Albumin: 3.8 g/dL (ref 3.5–5.0)
Alkaline Phosphatase: 72 U/L (ref 38–126)
Anion gap: 5 (ref 5–15)
BUN: 13 mg/dL (ref 8–23)
CO2: 37 mmol/L — ABNORMAL HIGH (ref 22–32)
Calcium: 9.4 mg/dL (ref 8.9–10.3)
Chloride: 97 mmol/L — ABNORMAL LOW (ref 98–111)
Creatinine, Ser: 0.66 mg/dL (ref 0.44–1.00)
GFR, Estimated: >60 mL/min (ref >60)
Glucose, Bld: 120 mg/dL — ABNORMAL HIGH (ref 70–99)
Potassium: 2.7 mmol/L — CL (ref 3.5–5.1)
Sodium: 139 mmol/L (ref 135–145)
Total Bilirubin: 1.2 mg/dL (ref 0.3–1.2)
Total Protein: 7 g/dL (ref 6.5–8.1)

## 2021-11-08 LAB — CBC WITH DIFFERENTIAL/PLATELET
Abs Immature Granulocytes: 0.02 10*3/uL (ref 0.00–0.07)
Basophils Absolute: 0 10*3/uL (ref 0.0–0.1)
Basophils Relative: 0 %
Eosinophils Absolute: 0.1 10*3/uL (ref 0.0–0.5)
Eosinophils Relative: 3 %
HCT: 36.3 % (ref 36.0–46.0)
Hemoglobin: 12.5 g/dL (ref 12.0–15.0)
Immature Granulocytes: 0 %
Lymphocytes Relative: 15 %
Lymphs Abs: 0.7 10*3/uL (ref 0.7–4.0)
MCH: 30.6 pg (ref 26.0–34.0)
MCHC: 34.4 g/dL (ref 30.0–36.0)
MCV: 89 fL (ref 80.0–100.0)
Monocytes Absolute: 0.1 10*3/uL (ref 0.1–1.0)
Monocytes Relative: 3 %
Neutro Abs: 3.8 10*3/uL (ref 1.7–7.7)
Neutrophils Relative %: 79 %
Platelets: 194 10*3/uL (ref 150–400)
RBC: 4.08 MIL/uL (ref 3.87–5.11)
RDW: 12.4 % (ref 11.5–15.5)
WBC: 4.9 10*3/uL (ref 4.0–10.5)
nRBC: 0 % (ref 0.0–0.2)

## 2021-11-08 IMAGING — XA IR IMAGING GUIDED PORT INSERTION
2 series · 9 of 9 positions shown · non-contrast
Comparison: none

CLINICAL DATA: Lung carcinoma, history of bilateral axillary lymph
node dissection, needs durable venous access for treatment regimen
TECHNIQUE: The procedure, risks, benefits, and alternatives were explained to
the patient. Questions regarding the procedure were encouraged and
answered. The patient understands and consents to the procedure.

[Series 1: processed: ir imaging guided port insert · 2 acquisitions, 8 frames shown]
[im 1/2]
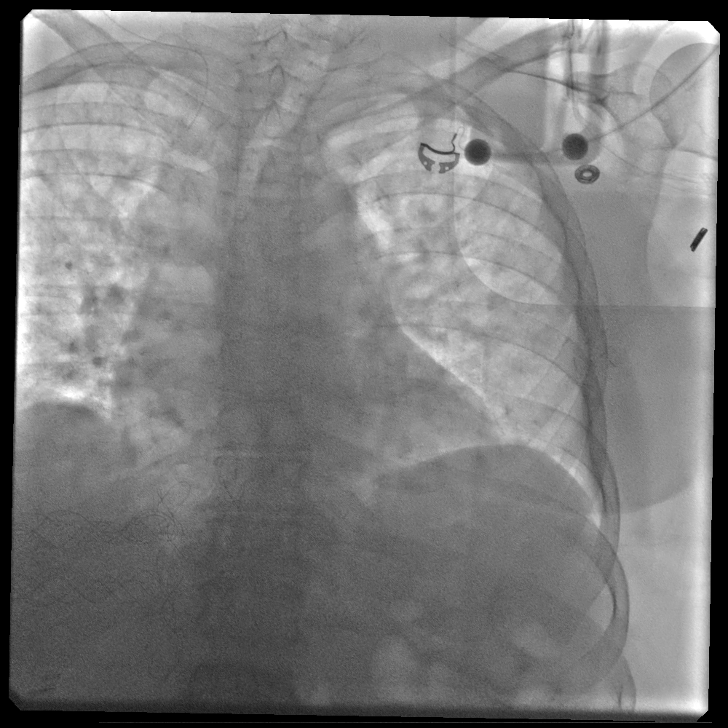
[im 1/2]
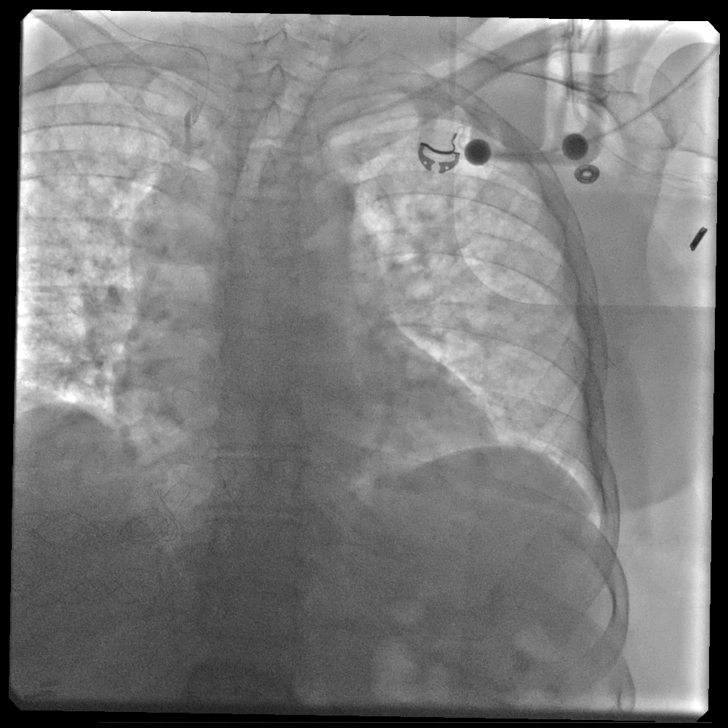
[im 1/2]
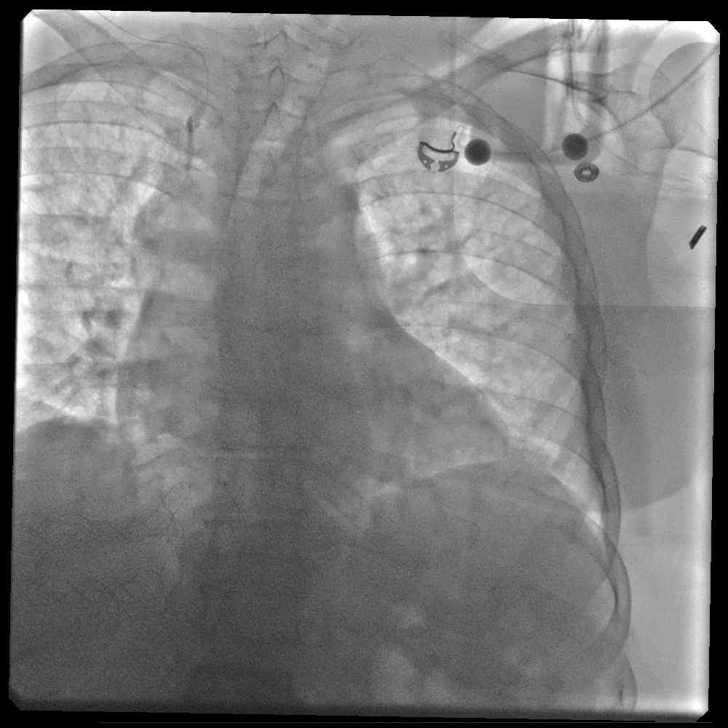
[im 1/2]
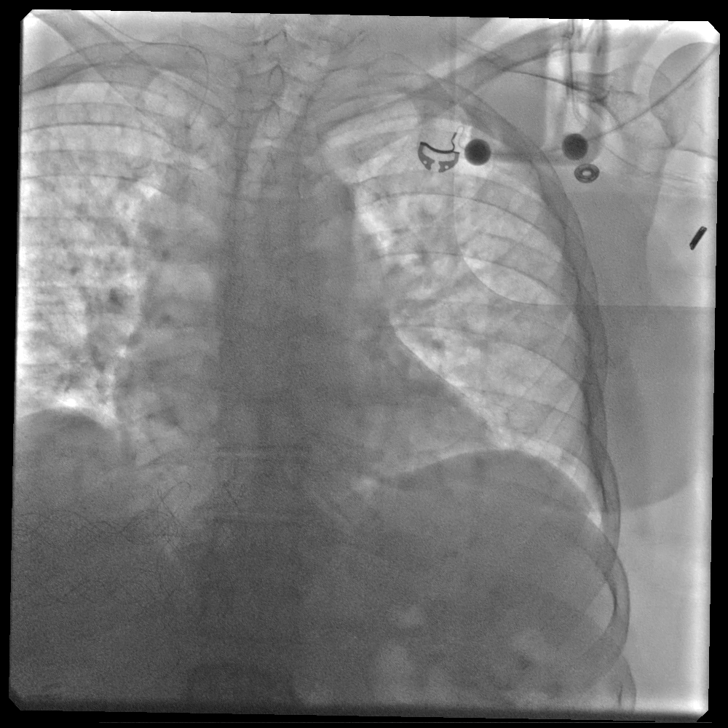
[im 2/2]
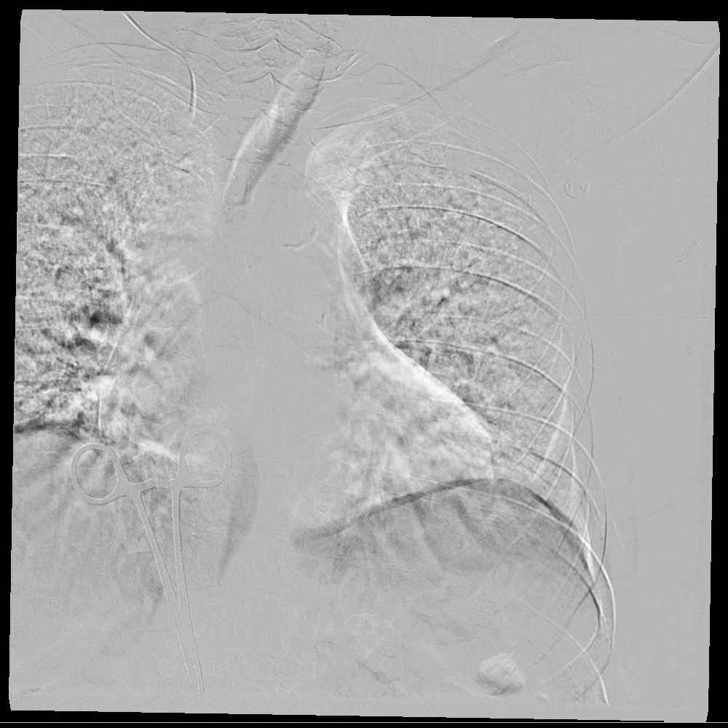
[im 2/2]
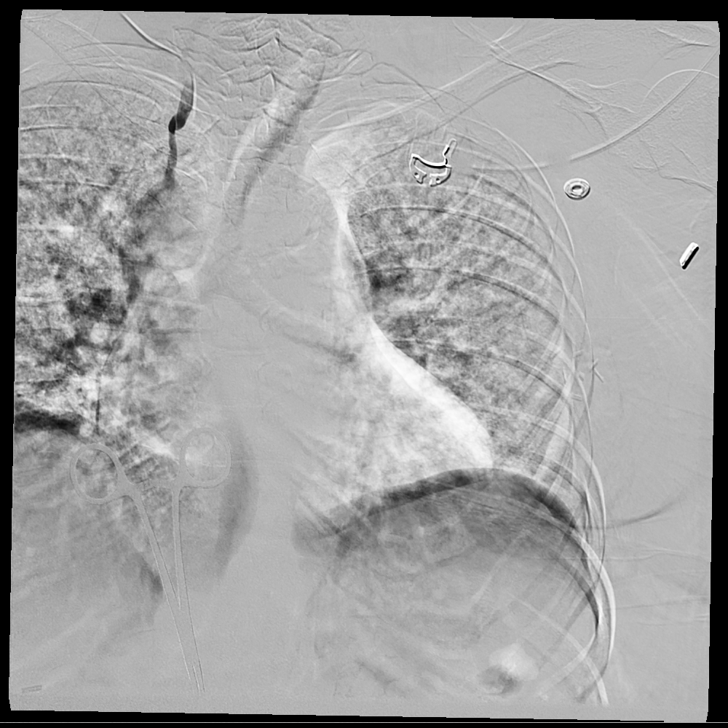
[im 2/2]
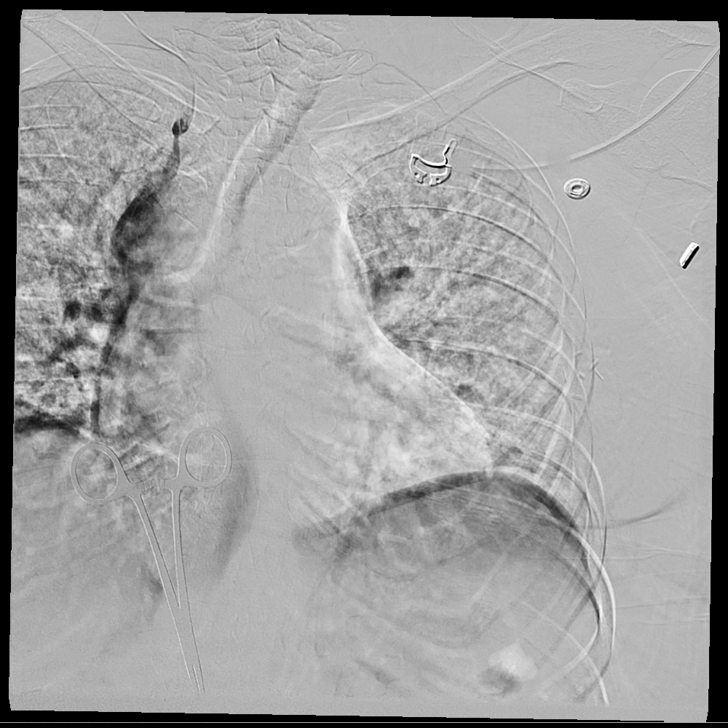
[im 2/2]
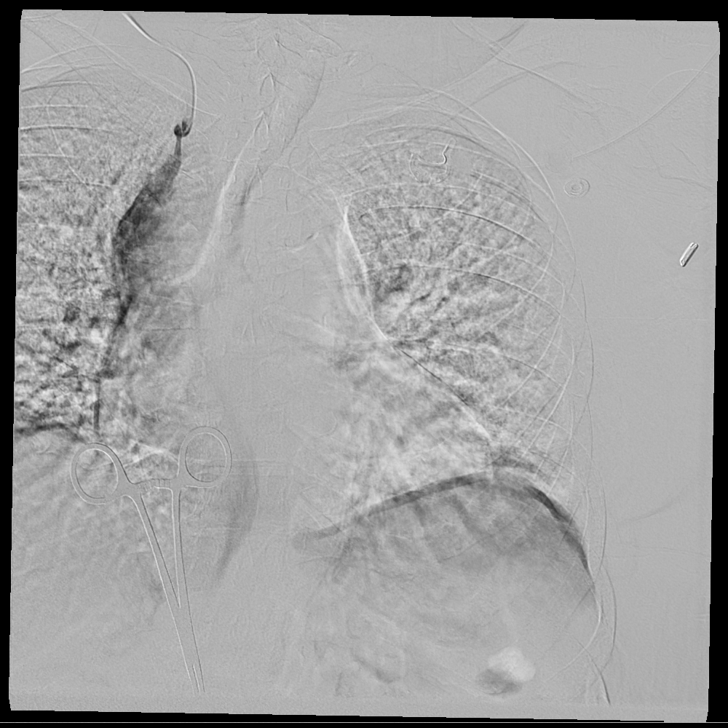

[Series 1: ir (id) (id)/(id)/(id) ir · 1 of 1 slices shown]
[im 1/1]
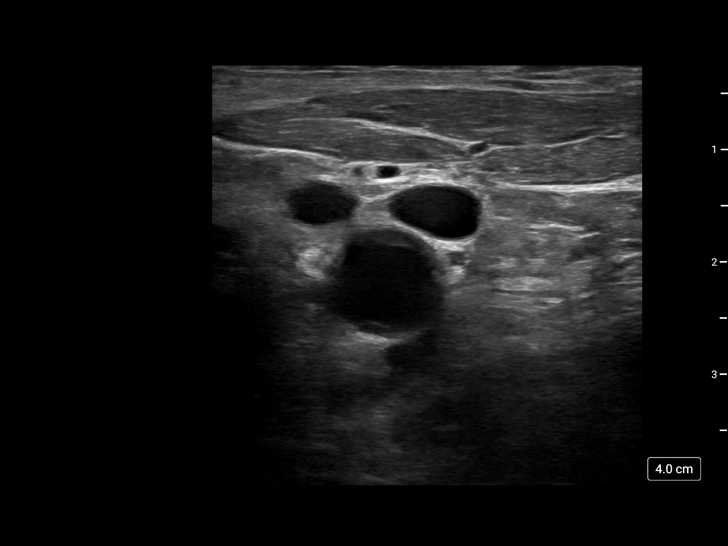

[9 of 9 positions shown; findings below may reference images not displayed]

EXAM:
TUNNELED PORT CATHETER PLACEMENT WITH ULTRASOUND AND FLUOROSCOPIC
GUIDANCE

FLUOROSCOPY:
Radiation Exposure Index (as provided by the fluoroscopic device):
44 mGy air Kerma

ANESTHESIA/SEDATION:
Intravenous Fentanyl [NG] and Versed 2mg were administered as
conscious sedation during continuous monitoring of the patient's
level of consciousness and physiological / cardiorespiratory status
by the radiology RN, with a total moderate sedation time of 28
minutes.
Patency of the right IJ vein was confirmed with ultrasound with
image documentation. An appropriate skin site was determined. Skin
site was marked. Region was prepped using maximum barrier technique
including cap and mask, sterile gown, sterile gloves, large sterile
sheet, and Chlorhexidine as cutaneous antisepsis. The region was
infiltrated locally with 1% lidocaine. Under real-time ultrasound
guidance, the right IJ vein was accessed with a 21 gauge
micropuncture needle; the needle tip within the vein was confirmed
with ultrasound image documentation. The guidewire would not advance
centrally. Microdilator was advanced, allowing venography
demonstrating tapered stenosis of the central aspect right IJ vein.
The transitional dilator set was utilized through which an angled
stiff glidewire was advanced through the stenotic right IJ vein and
advanced into the IVC. A 5 French angiographic catheter was placed.
Vascular measurement was performed.

A small incision was made on the right anterior chest wall and a
subcutaneous pocket fashioned. The power-injectable port was
positioned and its catheter tunneled to the right IJ dermatotomy
site. The angiographic catheter was exchanged over an Amplatz wire
for a peel-away sheath, through which the port catheter, which had
been trimmed to the appropriate length, was advanced and positioned
under fluoroscopy with its tip at the cavoatrial junction. Spot
chest radiograph confirms good catheter position and no
pneumothorax. The port was flushed per protocol. The pocket was
closed with deep interrupted and subcuticular continuous 3-0
Monocryl sutures. The incisions were covered with Dermabond then
covered with a sterile dressing.

The patient tolerated the procedure well.

COMPLICATIONS:
COMPLICATIONS
None immediate
IMPRESSION: Technically successful right IJ power-injectable port catheter
placement. Ready for routine use.

## 2021-11-08 MED ORDER — HEPARIN SOD (PORK) LOCK FLUSH 100 UNIT/ML IV SOLN
INTRAVENOUS | Status: AC
  Filled 2021-11-08: qty 5

## 2021-11-08 MED ORDER — MIDAZOLAM HCL 2 MG/2ML IJ SOLN
INTRAMUSCULAR | Status: AC | PRN
  Administered 2021-11-08 (×2): 1 mg via INTRAVENOUS

## 2021-11-08 MED ORDER — LIDOCAINE-EPINEPHRINE 1 %-1:100000 IJ SOLN
INTRAMUSCULAR | Status: AC | PRN
  Administered 2021-11-08: 20 mL

## 2021-11-08 MED ORDER — SODIUM CHLORIDE 0.9 % IV SOLN
INTRAVENOUS | Status: DC
Start: 2021-11-08 — End: 2021-11-09

## 2021-11-08 MED ORDER — MIDAZOLAM HCL 2 MG/2ML IJ SOLN
INTRAMUSCULAR | Status: AC
  Filled 2021-11-08: qty 4

## 2021-11-08 MED ORDER — FENTANYL CITRATE (PF) 100 MCG/2ML IJ SOLN
INTRAMUSCULAR | Status: AC
  Filled 2021-11-08: qty 2

## 2021-11-08 MED ORDER — IOHEXOL 300 MG/ML  SOLN
50.0000 mL | Freq: Once | INTRAMUSCULAR | Status: AC | PRN
  Administered 2021-11-08: 10 mL via INTRAVENOUS

## 2021-11-08 MED ORDER — LIDOCAINE-EPINEPHRINE 1 %-1:100000 IJ SOLN
INTRAMUSCULAR | Status: AC
  Filled 2021-11-08: qty 1

## 2021-11-08 MED ORDER — FENTANYL CITRATE (PF) 100 MCG/2ML IJ SOLN
INTRAMUSCULAR | Status: AC | PRN
  Administered 2021-11-08 (×2): 50 ug via INTRAVENOUS

## 2021-11-08 MED ORDER — HEPARIN SOD (PORK) LOCK FLUSH 100 UNIT/ML IV SOLN
INTRAVENOUS | Status: AC | PRN
  Administered 2021-11-08: 500 [IU] via INTRAVENOUS

## 2021-11-08 NOTE — Progress Notes (Signed)
Oxygen 96% prior to discharge within hospital to Pt's sister Hilda Blades, who will be accompanying her to her appointment at [facility].  Pt and sister verbalize understanding to call short stay with any issues prior to appointment. ?

## 2021-11-08 NOTE — Consult Note (Signed)
Chief Complaint: Patient was seen in consultation today for Port-A-Cath placement  Referring Physician(s): Iruku,Praveena  Supervising Physician: Oley Balm  Patient Status: [facility] - Out-pt  History of Present Illness: Regina Mcmahon is a 71 y.o. 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  She has poor venous access and presents today for Port-A-Cath placement to assist with treatment.  Additional history as below.   Past Medical History:  Diagnosis Date   Arthritis    Asthma    Asymptomatic varicose veins    Breast cancer (HCC) 06/2014   left   Cancer (HCC)    Supra Glottis   Chronic airway obstruction (HCC)    Complication of anesthesia 2016   difficulty waking up, Oxygen dropped low.   COPD (chronic obstructive pulmonary disease) (HCC)    Depression    GERD (gastroesophageal reflux disease)    Hemorrhage rectum    and anus.   Hypertension    Insomnia    Other symptoms involving digestive system(787.99)    Pain    Hx.pain in joint involving pelvic region and thigh; pain in limb   Palpitations    Panic attacks    Personal history of chemotherapy 2016   Personal history of radiation therapy 2016   Pneumonia    Pre-diabetes    Sinusitis    Symptomatic menopausal or female climacteric states    Varicose veins of both lower extremities with pain     Past Surgical History:  Procedure Laterality Date   BREAST BIOPSY Left 10/2012   BREAST BIOPSY Left 07/01/2014   malignant   BREAST LUMPECTOMY Left 08/20/2014   BRONCHIAL BIOPSY  08/23/2021   Procedure: BRONCHIAL BIOPSIES;  Surgeon: Leslye Peer, MD;  [facility]: [facility];  Service: Pulmonary;;   BRONCHIAL BRUSHINGS  08/23/2021   Procedure: BRONCHIAL BRUSHINGS;  Surgeon: Leslye Peer, MD;  Location: [facility] ENDOSCOPY;   Service: Pulmonary;;   BRONCHIAL NEEDLE ASPIRATION BIOPSY  08/23/2021   Procedure: BRONCHIAL NEEDLE ASPIRATION BIOPSIES;  Surgeon: Leslye Peer, MD;  Location: [facility] ENDOSCOPY;  Service: Pulmonary;;   BRONCHIAL WASHINGS  08/23/2021   Procedure: BRONCHIAL WASHINGS;  Surgeon: Leslye Peer, MD;  Location: [facility];  Service: Pulmonary;;   CESAREAN SECTION     CHOLECYSTECTOMY  1985   COLONOSCOPY  2002   Dr. Cleotis Nipper: internal hemorrhoids, one small rectal polyp, adenomatous path    COLONOSCOPY N/A 11/23/2015   Procedure: COLONOSCOPY;  Surgeon: West Bali, MD;  [facility]: [facility];  Service: Endoscopy;  Laterality: N/A;  1100 - moved to 10:45 - office to notify   ESOPHAGOGASTRODUODENOSCOPY N/A 11/23/2015   Procedure: ESOPHAGOGASTRODUODENOSCOPY (EGD);  Surgeon: West Bali, MD;  [facility]: [facility];  Service: Endoscopy;  Laterality: N/A;   PORT-A-CATH REMOVAL  02/2015   PORTACATH PLACEMENT Right 09/17/2014   Procedure: INSERTION PORT-A-CATH WITH ULTRASOUND;  Surgeon: Harriette Bouillon, MD;  Location: [facility];  Service: General;  Laterality: Right;  IJ   RADIOACTIVE SEED GUIDED PARTIAL MASTECTOMY WITH AXILLARY SENTINEL LYMPH NODE BIOPSY Left 08/20/2014   Procedure: LEFT BREAST LUMPECTOMY WITH RADIOACTIVE SEED LOCALIZATION AND SENTINEL LYMPH NODE MAPPING;  Surgeon: Harriette Bouillon, MD;  Location: [facility];  Service: General;  Laterality: Left;   RE-EXCISION OF BREAST LUMPECTOMY Left 09/17/2014   Procedure: RE-EXCISION OF BREAST LUMPECTOMY;  Surgeon: Harriette Bouillon, MD;  Location:  SURGERY CENTER;  Service: General;  Laterality: Left;   TOTAL HIP ARTHROPLASTY Right 02/22/2021   Procedure: RIGHT TOTAL HIP ARTHROPLASTY ANTERIOR APPROACH;  Surgeon: Eldred Manges, MD;  [facility] OR;  Service: Orthopedics;  Laterality: Right;   TUBAL LIGATION     VIDEO BRONCHOSCOPY WITH ENDOBRONCHIAL ULTRASOUND N/A 08/23/2021   Procedure: VIDEO BRONCHOSCOPY WITH  ENDOBRONCHIAL ULTRASOUND;  Surgeon: Leslye Peer, MD;  [facility];  Service: Pulmonary;  Laterality: N/A;   VIDEO BRONCHOSCOPY WITH RADIAL ENDOBRONCHIAL ULTRASOUND  08/23/2021   Procedure: VIDEO BRONCHOSCOPY WITH RADIAL ENDOBRONCHIAL ULTRASOUND;  Surgeon: Leslye Peer, MD;  [facility];  Service: Pulmonary;;    Allergies: Patient has no known allergies.  Medications: Prior to Admission medications   Medication Sig Start Date End Date Taking? Authorizing Provider  albuterol (PROVENTIL HFA;VENTOLIN HFA) 108 (90 BASE) MCG/ACT inhaler Inhale 2 puffs into the lungs every 6 (six) hours as needed for wheezing or shortness of breath.     [provider]  ALPRAZolam Prudy Feeler) 0.5 MG tablet Take 0.5 mg by mouth 3 (three) times daily.    [provider]  ANORO ELLIPTA 62.5-25 MCG/INH AEPB Inhale 1 puff into the lungs daily. 10/17/19   [provider]  aspirin EC 325 MG EC tablet Take 1 tablet (325 mg total) by mouth daily with breakfast. Must take at least 4 weeks postop for DVT prophylaxis Patient not taking: Reported on 04/15/2021 02/27/21   Naida Sleight, PA-C  calcium carbonate (TUMS - DOSED IN MG ELEMENTAL CALCIUM) 500 MG chewable tablet Chew 2 tablets by mouth 3 (three) times daily as needed for indigestion or heartburn.    [provider]  fluconazole (DIFLUCAN) 100 MG tablet Take 2 tablets today, then 1 tablet daily x 20 more days. 08/30/21   Lonie Peak, MD  furosemide (LASIX) 20 MG tablet Take 20 mg by mouth daily.    [provider]  hydrochlorothiazide (HYDRODIURIL) 12.5 MG tablet Take 12.5 mg by mouth daily. 12/25/20   [provider]  ibuprofen (ADVIL) 800 MG tablet Take 800 mg by mouth every 8 (eight) hours as needed for moderate pain.    [provider]  lidocaine (XYLOCAINE) 2 % solution Patient: Mix 1part 2% viscous lidocaine, 1part H20. Swallow 10mL of diluted mixture, before meals and at bedtime,  up to QID for sore throat. 08/25/21   Lonie Peak, MD  Menthol, Topical Analgesic, (BIOFREEZE EX) Apply 1 application topically daily as needed (pain).    [provider]  pantoprazole (PROTONIX) 40 MG tablet Take 1 tablet by mouth once daily 01/03/20   Anice Paganini, NP  potassium chloride (KLOR-CON M) 10 MEQ tablet Take 10 mEq by mouth daily. Patient stated stated that she was started on 20 meq daily in August.    [provider]  potassium chloride (MICRO-K) 10 MEQ CR capsule Take 2 capsules (20 mEq total) by mouth 2 (two) times daily. 11/04/21   [name], MD  sertraline (ZOLOFT) 100 MG tablet Take 100 mg by mouth daily. 07/23/19   [provider]     Family History  Problem Relation Age of Onset   Diabetes Mother    Ovarian cancer Mother 16   Other Father        died in MVA at age 44   Other Sister        mitral valve disorder   Melanoma Sister 16       removed from  back of leg   Colon cancer Brother        early 40s, succumbed to disease   Cancer Paternal Aunt        leukemia, bone, or lung cancer   Alzheimer's disease Maternal Grandmother    Heart attack Maternal Grandfather    Heart attack Paternal Grandmother    COPD Paternal Grandfather    Emphysema Paternal Grandfather    Congestive Heart Failure Paternal Aunt    Stomach cancer Paternal Uncle    Kidney cancer Maternal Uncle    Cancer Cousin        dx. teens/dx. 40s   Cancer Cousin    Colon cancer Cousin     Social History   Socioeconomic History   Marital status: Divorced    Spouse name: Not on file   Number of children: 2   Years of education: Not on file   Highest education level: Not on file  Occupational History   Not on file  Tobacco Use   Smoking status: Former    Packs/day: 1.00    Years: 42.00    Pack years: 42.00    Types: Cigarettes    Quit date: 02/22/2021    Years since quitting: 0.7   Smokeless tobacco: Never  Vaping Use   Vaping Use: Never used  Substance  and Sexual Activity   Alcohol use: No    Alcohol/week: 0.0 standard drinks   Drug use: No   Sexual activity: Never    Birth control/protection: None  Other Topics Concern   Not on file  Social History Narrative   Not on file   Social Determinants of Health   Financial Resource Strain: Not on file  Food Insecurity: Not on file  Transportation Needs: Not on file  Physical Activity: Not on file  Stress: Not on [id number]: Unknown   Frequency of Communication with Friends [id number] harynx and larynx: Exophytic lesion seen on prior at the right glottis is no longer identified. There is a nodular area of accentuated enhancement at  the anterior supraglottic larynx, 9 x 5 mm. No submucosal nodularity. Salivary glands: Unremarkable Thyroid: 3.5 cm right thyroid nodule, unchanged in without invasive features. Patient is undergoing active surveillance. Lymph nodes: Both jugulodigastric nodes measure larger but retain normal morphology with fatty hila and are at the upper range of normal length (15 mm). Suspect this is reactive enlargement given the symmetry, only the right side was borderline warm on prior PET. No heterogeneity of nodes in the neck Vascular: Carotid atheromatous calcifications. Limited intracranial: Negative Visualized orbits: Not covered Mastoids and visualized paranasal sinuses: Clear Skeleton: No acute or aggressive finding. Upper chest: Reported separately.  Upper mediastinal adenopathy. IMPRESSION: 1. Resolved polypoid lesion of the larynx. Mucosal prominence along the midline supraglottic larynx is likely stable and non tumoral, this should be apparent on mucosal exam. 2. No worrisome nodes in the neck. 3. Mediastinal adenopathy, reference dedicated chest CT. Electronically Signed   By: Tiburcio Pea M.D.   On: 10/20/2021 07:33   CT Chest W Contrast  Result Date: 10/20/2021 CLINICAL DATA:  70 year old female with history of head neck cancer. Follow-up study. EXAM: CT CHEST WITH CONTRAST TECHNIQUE: Multidetector CT imaging of the chest was performed during intravenous contrast administration. RADIATION DOSE REDUCTION: This exam was performed according to the departmental dose-optimization program which includes automated exposure control, adjustment of the mA and/or kV according to patient size and/or use of iterative reconstruction technique. CONTRAST:  80mL OMNIPAQUE IOHEXOL 300 MG/ML  SOLN COMPARISON:  Chest CT 07/23/2021.   PET-CT 07/23/2021. FINDINGS: Cardiovascular: Heart size is normal. There is no significant pericardial fluid, thickening or pericardial calcification. There is aortic atherosclerosis, as well as atherosclerosis of the great vessels of the mediastinum and the coronary arteries, including calcified atherosclerotic plaque in the left main, left anterior descending and right coronary arteries. Calcifications of the aortic valve and mitral annulus Mediastinum/Nodes: There are multiple enlarged mediastinal lymph nodes, including a nodal conglomerate in the subcarinal nodal station measuring 2.1 x 3.4 cm (axial image 24 of series 3) and multiple other enlarged lymph nodes measuring up to 2.0 cm in the right paratracheal nodal stations (axial images 15 and 16 of series 3). Esophagus is unremarkable in appearance. No axillary lymphadenopathy. Asymmetric mass-like enlargement and heterogeneity in the right lobe of the thyroid gland which measures 4.1 x 3.2 cm. Lungs/Pleura: Today's study is limited by considerable patient respiratory motion. With these limitations in mind, the previously noted left lower lobe pulmonary nodule (axial image 85 of series 6) currently measures 1.1 x 1.0 cm (previously 1.1 x 0.7 cm). A few other scattered smaller pulmonary nodules are also noted elsewhere in the lungs, similar to the prior study but partially obscured by motion. No confluent consolidative airspace disease. No pleural effusions. Diffuse bronchial wall thickening with mild centrilobular and paraseptal emphysema. Upper Abdomen: Status post cholecystectomy. Musculoskeletal: There are no aggressive appearing lytic or blastic lesions noted in the visualized portions of the skeleton. IMPRESSION: 1. Slight interval enlargement of left lower lobe pulmonary nodule which currently measures 1.1 x 1.0 cm. 2. Extensive mediastinal lymphadenopathy appears very similar to the prior examination, as detailed above. 3. Asymmetrically enlarged right  lobe of thyroid gland, similar to the prior study, previously evaluated by thyroid ultrasound. Correlation with results of fine-needle aspiration is recommended. 4. Aortic atherosclerosis, in addition to left main and 2 vessel coronary artery disease. Please note that although the presence of coronary artery calcium documents the presence of coronary artery disease, the severity of this disease and any  potential stenosis cannot be assessed on this non-gated CT examination. Assessment for potential risk factor modification, dietary therapy or pharmacologic therapy may be warranted, if clinically indicated. 5. There are calcifications of the aortic valve and mitral annulus. Echocardiographic correlation for evaluation of potential valvular dysfunction may be warranted if clinically indicated. 6. Mild diffuse bronchial wall thickening with mild centrilobular and paraseptal emphysema; imaging findings suggestive of underlying COPD Aortic Atherosclerosis (ICD10-I70.0) and Emphysema (ICD10-J43.9). Electronically Signed   By: Trudie Reed M.D.   On: 10/20/2021 07:36    Labs:  CBC: Recent Labs    02/24/21 0203 08/10/21 1108 09/24/21 1307 11/01/21 0807  WBC 12.9* 6.5 6.6 6.2  HGB 12.4 14.0 13.0 13.9  HCT 35.8* 42.4 38.2 39.5  PLT 174 241 200 223    COAGS: No results for input(s): INR, APTT in the last 8760 hours.  BMP: Recent Labs    02/26/21 1013 07/23/21 0842 08/10/21 1108 09/24/21 1307 11/01/21 0807  NA 136  --  139 142 142  K 3.5  --  4.1 3.1* 2.4*  CL 96*  --  102 100 99  CO2 32  --  31 34* 35*  GLUCOSE 139*  --  103* 111* 126*  BUN 15  --  CALCIUM 8.9  --  10.1 10.1 9.5  CREATININE 0.82 0.90 1.01* 1.03* 0.78  GFRNONAA >60  --  >60 59* >60    LIVER FUNCTION TESTS: Recent Labs    02/18/21 1530 08/10/21 1108 09/24/21 1307 11/01/21 0807  BILITOT 1.2 0.9 0.8 1.0  AST 16 10* 9* 10*  ALT ALKPHOS 68 81 76 79  PROT 6.4* 7.5 7.4 7.0  ALBUMIN 3.4* 4.2 4.0  3.7    TUMOR MARKERS: No results for input(s): AFPTM, CEA, CA199, CHROMGRNA in the last 8760 hours.  Assessment and Plan: 71 y.o. 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  She has poor venous access and presents today for Port-A-Cath placement to assist with treatment.Risks and benefits of image guided port-a-catheter placement was discussed with the patient including, but not limited to bleeding, infection, pneumothorax, or fibrin sheath development and need for additional procedures.  All of the patient's questions were answered, patient is agreeable to proceed. Consent signed and in chart.    Thank you for this interesting consult.  I greatly enjoyed meeting [name] and look forward to participating in their care.  A copy of this report was sent to the requesting provider on this date.  Electronically Signed: D. Jeananne Rama, PA-C 11/08/2021, 9:47 AM   I spent a total of  20 minutes   in face to face in clinical consultation, greater than 50% of which was counseling/coordinating care for port a cath placement

## 2021-11-08 NOTE — Telephone Encounter (Signed)
CRITICAL VALUE STICKER ? ?CRITICAL VALUE:  Potassium 2.7 ? ?RECEIVER (on-site recipient of call): Georgina Pillion, RN ? ?DATE & TIME NOTIFIED: 11/08/21; 1525 ? ?MESSENGER (representative from lab):Lauren ? ?MD NOTIFIED: Dr. Chryl Heck and Dede Query, PA ? ?TIME OF NOTIFICATION:1535 ? ?RESPONSE: Information acknowledged by Joyice Faster, [place]  - orders for [facility] 11/08/21; lab appt made for 11/08/21 prior to appt with Joyice Faster, PA ? ?

## 2021-11-08 NOTE — Procedures (Signed)
?  Procedure:  Port catheter placement R IJ   ?Preprocedure diagnosis: Diagnoses of Squamous cell carcinoma of lung, unspecified laterality ([place]) and High risk medication use were pertinent to this visit. ? ?Postprocedure diagnosis: same ?EBL:    minimal ?Complications:   none immediate ? ?See full dictation in [facility]. ? ?D. Arne Cleveland MD ?Main # [phone] ?Pager  [phone] ?Mobile [phone] ?  ? ?

## 2021-11-09 ENCOUNTER — Inpatient Hospital Stay (HOSPITAL_BASED_OUTPATIENT_CLINIC_OR_DEPARTMENT_OTHER): Payer: Medicare HMO | Admitting: Physician Assistant

## 2021-11-09 ENCOUNTER — Inpatient Hospital Stay: Payer: Medicare HMO

## 2021-11-09 ENCOUNTER — Encounter: Payer: Self-pay | Admitting: Hematology and Oncology

## 2021-11-09 ENCOUNTER — Other Ambulatory Visit: Payer: Self-pay

## 2021-11-09 ENCOUNTER — Encounter: Payer: Self-pay | Admitting: General Practice

## 2021-11-09 ENCOUNTER — Ambulatory Visit
Admission: RE | Admit: 2021-11-09 | Discharge: 2021-11-09 | Disposition: A | Discharge status: Home / Self Care | Payer: Medicare HMO | Source: Ambulatory Visit | Attending: Radiation Oncology | Admitting: Radiation Oncology

## 2021-11-09 VITALS — BP 136/77 | HR 72 | Temp 97.7°F | Resp 18 | Wt 215.2 lb

## 2021-11-09 DIAGNOSIS — C50412 Malignant neoplasm of upper-outer quadrant of left female breast: Secondary | ICD-10-CM | POA: Diagnosis not present

## 2021-11-09 DIAGNOSIS — E876 Hypokalemia: Secondary | ICD-10-CM

## 2021-11-09 DIAGNOSIS — R110 Nausea: Secondary | ICD-10-CM

## 2021-11-09 DIAGNOSIS — C3432 Malignant neoplasm of lower lobe, left bronchus or lung: Secondary | ICD-10-CM | POA: Diagnosis not present

## 2021-11-09 DIAGNOSIS — B370 Candidal stomatitis: Secondary | ICD-10-CM

## 2021-11-09 DIAGNOSIS — Z79899 Other long term (current) drug therapy: Secondary | ICD-10-CM | POA: Diagnosis not present

## 2021-11-09 DIAGNOSIS — Z9049 Acquired absence of other specified parts of digestive tract: Secondary | ICD-10-CM | POA: Diagnosis not present

## 2021-11-09 DIAGNOSIS — Z8041 Family history of malignant neoplasm of ovary: Secondary | ICD-10-CM | POA: Diagnosis not present

## 2021-11-09 DIAGNOSIS — Z923 Personal history of irradiation: Secondary | ICD-10-CM | POA: Diagnosis not present

## 2021-11-09 DIAGNOSIS — I700 Atherosclerosis of aorta: Secondary | ICD-10-CM | POA: Diagnosis not present

## 2021-11-09 DIAGNOSIS — C3490 Malignant neoplasm of unspecified part of unspecified bronchus or lung: Secondary | ICD-10-CM

## 2021-11-09 DIAGNOSIS — Z833 Family history of diabetes mellitus: Secondary | ICD-10-CM | POA: Diagnosis not present

## 2021-11-09 DIAGNOSIS — J449 Chronic obstructive pulmonary disease, unspecified: Secondary | ICD-10-CM | POA: Diagnosis not present

## 2021-11-09 DIAGNOSIS — Z5111 Encounter for antineoplastic chemotherapy: Secondary | ICD-10-CM | POA: Diagnosis not present

## 2021-11-09 DIAGNOSIS — Z170 Estrogen receptor positive status [ER+]: Secondary | ICD-10-CM

## 2021-11-09 DIAGNOSIS — K521 Toxic gastroenteritis and colitis: Secondary | ICD-10-CM | POA: Diagnosis not present

## 2021-11-09 DIAGNOSIS — R682 Dry mouth, unspecified: Secondary | ICD-10-CM | POA: Diagnosis not present

## 2021-11-09 DIAGNOSIS — Z87891 Personal history of nicotine dependence: Secondary | ICD-10-CM | POA: Diagnosis not present

## 2021-11-09 DIAGNOSIS — Z9221 Personal history of antineoplastic chemotherapy: Secondary | ICD-10-CM | POA: Diagnosis not present

## 2021-11-09 DIAGNOSIS — C320 Malignant neoplasm of glottis: Secondary | ICD-10-CM | POA: Diagnosis not present

## 2021-11-09 DIAGNOSIS — Z79811 Long term (current) use of aromatase inhibitors: Secondary | ICD-10-CM | POA: Diagnosis not present

## 2021-11-09 DIAGNOSIS — R197 Diarrhea, unspecified: Secondary | ICD-10-CM | POA: Diagnosis not present

## 2021-11-09 DIAGNOSIS — Z510 Encounter for antineoplastic radiation therapy: Secondary | ICD-10-CM | POA: Diagnosis not present

## 2021-11-09 DIAGNOSIS — R0600 Dyspnea, unspecified: Secondary | ICD-10-CM | POA: Diagnosis not present

## 2021-11-09 DIAGNOSIS — R490 Dysphonia: Secondary | ICD-10-CM | POA: Diagnosis not present

## 2021-11-09 DIAGNOSIS — T451X5A Adverse effect of antineoplastic and immunosuppressive drugs, initial encounter: Secondary | ICD-10-CM | POA: Diagnosis not present

## 2021-11-09 DIAGNOSIS — R5383 Other fatigue: Secondary | ICD-10-CM | POA: Diagnosis not present

## 2021-11-09 DIAGNOSIS — C321 Malignant neoplasm of supraglottis: Secondary | ICD-10-CM | POA: Diagnosis not present

## 2021-11-09 DIAGNOSIS — Z95828 Presence of other vascular implants and grafts: Secondary | ICD-10-CM

## 2021-11-09 LAB — RAD ONC ARIA SESSION SUMMARY
Course Elapsed Days: 6
Plan Fractions Treated to Date: 5
Plan Prescribed Dose Per Fraction: 2 Gy
Plan Total Fractions Prescribed: 30
Plan Total Prescribed Dose: 60 Gy
Reference Point Dosage Given to Date: 10 Gy
Reference Point Session Dosage Given: 2 Gy
Session Number: 5

## 2021-11-09 LAB — COMPREHENSIVE METABOLIC PANEL
ALT: 7 U/L (ref 0–44)
AST: 9 U/L — ABNORMAL LOW (ref 15–41)
Albumin: 3.7 g/dL (ref 3.5–5.0)
Alkaline Phosphatase: 68 U/L (ref 38–126)
Anion gap: 6 (ref 5–15)
BUN: 11 mg/dL (ref 8–23)
CO2: 35 mmol/L — ABNORMAL HIGH (ref 22–32)
Calcium: 9.3 mg/dL (ref 8.9–10.3)
Chloride: 100 mmol/L (ref 98–111)
Creatinine, Ser: 0.65 mg/dL (ref 0.44–1.00)
GFR, Estimated: >60 mL/min (ref >60)
Glucose, Bld: 109 mg/dL — ABNORMAL HIGH (ref 70–99)
Potassium: 2.9 mmol/L — ABNORMAL LOW (ref 3.5–5.1)
Sodium: 141 mmol/L (ref 135–145)
Total Bilirubin: 1.2 mg/dL (ref 0.3–1.2)
Total Protein: 6.9 g/dL (ref 6.5–8.1)

## 2021-11-09 MED ORDER — POTASSIUM CHLORIDE CRYS ER 20 MEQ PO TBCR
40.0000 meq | EXTENDED_RELEASE_TABLET | Freq: Once | ORAL | Status: AC
  Administered 2021-11-09: 40 meq via ORAL
  Filled 2021-11-09: qty 2

## 2021-11-09 MED ORDER — HYDROCODONE-ACETAMINOPHEN 5-325 MG PO TABS: 1.0000 | ORAL_TABLET | Freq: Four times a day (QID) | ORAL | 0 refills | Status: DC | PRN

## 2021-11-09 MED ORDER — FAMOTIDINE IN NACL 20-0.9 MG/50ML-% IV SOLN
20.0000 mg | Freq: Once | INTRAVENOUS | Status: AC
  Administered 2021-11-09: 20 mg via INTRAVENOUS
  Filled 2021-11-09: qty 50

## 2021-11-09 MED ORDER — SODIUM CHLORIDE 0.9% FLUSH
10.0000 mL | INTRAVENOUS | Status: DC | PRN
  Administered 2021-11-09: 10 mL via INTRAVENOUS

## 2021-11-09 MED ORDER — NYSTATIN 100000 UNIT/ML MT SUSP: 5.0000 mL | Freq: Four times a day (QID) | OROMUCOSAL | 0 refills | Status: DC

## 2021-11-09 MED ORDER — SODIUM CHLORIDE 0.9 % IV SOLN
10.0000 mg | Freq: Once | INTRAVENOUS | Status: DC
  Filled 2021-11-09: qty 1

## 2021-11-09 MED ORDER — DIPHENHYDRAMINE HCL 50 MG/ML IJ SOLN
50.0000 mg | Freq: Once | INTRAMUSCULAR | Status: AC
  Administered 2021-11-09: 50 mg via INTRAVENOUS
  Filled 2021-11-09: qty 1

## 2021-11-09 MED ORDER — SODIUM CHLORIDE 0.9 % IV SOLN
10.0000 mg | Freq: Once | INTRAVENOUS | Status: AC
  Administered 2021-11-09: 10 mg via INTRAVENOUS
  Filled 2021-11-09: qty 10

## 2021-11-09 MED ORDER — PROCHLORPERAZINE MALEATE 10 MG PO TABS: 10.0000 mg | ORAL_TABLET | Freq: Four times a day (QID) | ORAL | 0 refills | Status: DC | PRN

## 2021-11-09 MED ORDER — LIDOCAINE-PRILOCAINE 2.5-2.5 % EX CREA: 1.0000 "application " | TOPICAL_CREAM | CUTANEOUS | 0 refills | Status: DC | PRN

## 2021-11-09 MED ORDER — SODIUM CHLORIDE 0.9 % IV SOLN: Freq: Once | INTRAVENOUS | Status: AC

## 2021-11-09 MED ORDER — SODIUM CHLORIDE 0.9% FLUSH
10.0000 mL | INTRAVENOUS | Status: DC | PRN
  Administered 2021-11-09: 10 mL

## 2021-11-09 MED ORDER — HEPARIN SOD (PORK) LOCK FLUSH 100 UNIT/ML IV SOLN
500.0000 [IU] | Freq: Once | INTRAVENOUS | Status: AC | PRN
  Administered 2021-11-09: 500 [IU]

## 2021-11-09 MED ORDER — PALONOSETRON HCL INJECTION 0.25 MG/5ML
0.2500 mg | Freq: Once | INTRAVENOUS | Status: AC
  Administered 2021-11-09: 0.25 mg via INTRAVENOUS
  Filled 2021-11-09: qty 5

## 2021-11-09 MED ORDER — SODIUM CHLORIDE 0.9 % IV SOLN
214.4000 mg | Freq: Once | INTRAVENOUS | Status: AC
  Administered 2021-11-09: 210 mg via INTRAVENOUS
  Filled 2021-11-09: qty 21

## 2021-11-09 MED ORDER — ONDANSETRON HCL 8 MG PO TABS: 8.0000 mg | ORAL_TABLET | Freq: Three times a day (TID) | ORAL | 0 refills | Status: DC | PRN

## 2021-11-09 MED ORDER — SODIUM CHLORIDE 0.9 % IV SOLN
45.0000 mg/m2 | Freq: Once | INTRAVENOUS | Status: AC
  Administered 2021-11-09: 96 mg via INTRAVENOUS
  Filled 2021-11-09: qty 16

## 2021-11-09 NOTE — Progress Notes (Signed)
Oncology Nurse Navigator Documentation  ? ?At the request of Dr. Isidore Moos, I have called Dr. Redmond Baseman' office and scheduled Regina Mcmahon to see him on 4/28 at 2:30 for evaluation of her continued dysphagia and throat pain after completing radiation on 09/13/21 to her larynx. I have called her and left a voicemail with the appointment date and time. I also provided Dr. Redmond Baseman' office number to reschedule as needed. She knows to call me with any further questions or concerns.  ? ?Harlow Asa RN, BSN, OCN ?Head & Neck Oncology Nurse Navigator ?[place] at [facility] ?Phone # [phone]  ?Fax # [phone]  ?

## 2021-11-09 NOTE — Progress Notes (Signed)
[facility] Spiritual Care Note ? ?Followed up with Regina Mcmahon in infusion. We keep our contacts short because of her throat's rawness, but she is so appreciative of chaplain connections. She notes that she keeps her handmade blessing blanket on her bed because it is so meaningful to her. Being remembered in prayer is similarly moving for her. We plan to keep in touch via infusion visits, and she is aware of ongoing chaplain availability, should needs arise in between. ? ? ?Chaplain Lorrin Jackson, MDiv, [facility] ?Pager [phone] ?Voicemail [phone]  ?

## 2021-11-09 NOTE — Patient Instructions (Signed)
[facility] MEDICAL ONCOLOGY  Discharge Instructions: Thank you for choosing [facility] to provide your oncology and hematology care.   If you have a lab appointment with the Cancer Center, please go directly to the Cancer Center and check in at the registration area.   Wear comfortable clothing and clothing appropriate for easy access to any Portacath or PICC line.   We strive to give you quality time with your provider. You may need to reschedule your appointment if you arrive late (15 or more minutes).  Arriving late affects you and other patients whose appointments are after yours.  Also, if you miss three or more appointments without notifying the office, you may be dismissed from the clinic at the provider's discretion.      For prescription refill requests, have your pharmacy contact our office and allow 72 hours for refills to be completed.    Today you received the following chemotherapy and/or immunotherapy agents: Taxol & Carboplatin   To help prevent nausea and vomiting after your treatment, we encourage you to take your nausea medication as directed.  BELOW ARE SYMPTOMS THAT SHOULD BE REPORTED IMMEDIATELY: *FEVER GREATER THAN 100.4 F (38 C) OR HIGHER *CHILLS OR SWEATING *NAUSEA AND VOMITING THAT IS NOT CONTROLLED WITH YOUR NAUSEA MEDICATION *UNUSUAL SHORTNESS OF BREATH *UNUSUAL BRUISING OR BLEEDING *URINARY PROBLEMS (pain or burning when urinating, or frequent urination) *BOWEL PROBLEMS (unusual diarrhea, constipation, pain near the anus) TENDERNESS IN MOUTH AND THROAT WITH OR WITHOUT PRESENCE OF ULCERS (sore throat, sores in mouth, or a toothache) UNUSUAL RASH, SWELLING OR PAIN  UNUSUAL VAGINAL DISCHARGE OR ITCHING   Items with * indicate a potential emergency and should be followed up as soon as possible or go to the Emergency Department if any problems should occur.  Please show the CHEMOTHERAPY ALERT CARD or IMMUNOTHERAPY ALERT CARD at  check-in to the Emergency Department and triage nurse.  Should you have questions after your visit or need to cancel or reschedule your appointment, please contact [facility]  Dept: [phone]  and follow the prompts.  Office hours are 8:00 a.m. to 4:30 p.m. Monday - Friday. Please note that voicemails left after 4:00 p.m. may not be returned until the following business day.  We are closed weekends and major holidays. You have access to a nurse at all times for urgent questions. Please call the main number to the clinic Dept: [phone] and follow the prompts.   For any non-urgent questions, you may also contact your provider using MyChart. We now offer e-Visits for anyone 18 and older to request care online for non-urgent symptoms. For details visit [email or web address].   Also download the MyChart app! Go to the app store, search "MyChart", open the app, select [facility], and log in with your MyChart username and password.  Due to Covid, a mask is required upon entering the hospital/clinic. If you do not have a mask, one will be given to you upon arrival. For doctor visits, patients may have 1 support person aged 18 or older with them. For treatment visits, patients cannot have anyone with them due to current Covid guidelines and our immunocompromised population.   

## 2021-11-09 NOTE — Progress Notes (Signed)
[facility] [phone]   [phone]) [phone]  PROGRESS NOTE  Patient Care Team: [name], MD as PCP - General (Internal Medicine) Herminio Commons, MD (Inactive) as PCP - Cardiology (Cardiology) Whitney Muse, Kelby Fam, MD (Inactive) as Consulting Physician (Hematology and Oncology) Erroll Luna, MD as Consulting Physician (General Surgery) Thea Silversmith, MD as Consulting Physician (Radiation Oncology) Sylvan Cheese, NP as Nurse Practitioner (Hematology and Oncology) Danie Binder, MD (Inactive) as Consulting Physician (Gastroenterology) Malmfelt, Stephani Police, RN as Oncology Nurse Navigator Melida Quitter, MD as Consulting Physician (Otolaryngology) Eppie Gibson, MD as Consulting Physician (Radiation Oncology) Benay Pike, MD as Consulting Physician (Hematology and Oncology)  CHIEF COMPLAINTS/PURPOSE OF CONSULTATION:  1.Malignant neoplasm of glottis/supraglottis  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 3. 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     06/24/2014 Breast US   Left breast: ill-defined predominantly hypoechoic area with some increased echogenicity in the 2 o'clock position of the left breast, 7 cm from the nipple. 2.6 x 2.3 x 1.8 cm in maximum dimensions.    07/01/2014 Initial Biopsy   Left breast core needle bx: Invasive mammary carcinoma with lobular features, ER+ (100%), PR+ (91%), HER2/neu negative (ratio 1.09), Ki-67 32%. E-cadherin strongly positive, diagnostic for IDC     07/01/2014 Clinical Stage   Stage IIA: T2 N0    08/20/2014 Definitive Surgery   Left breast seed localized lumpectomy/SLNB: IDC, +LVI, DCIS, 1 LN  removed and positive for metastatic carcinoma. Grade 2. HER2/neu repeated and negative (ratio 0.69-1.9).     08/20/2014 Pathologic Stage   Stage IIB: pT2 pN1a pMx    09/17/2014 Surgery   Port-a cath placement and re-excision    10/02/2014 - 01/15/2015 Chemotherapy   CMF x 6 cycles.  patient refused Taxane-containing chemotherapy.    03/07/2015 Procedure   Comp Cancer Panel reveals no variant at APC, ATM, AXIN2, BARD1, BMPR1A, BRCA1, BRCA2, BRIP1, CDH1, CDK4, CDKN2A, CHEK2, FANCC, MLH1, MSH2, MSH6, MUTYH, NBN, PALB2, PMS2, POLD1, POLE, PTEN, RAD51C, RAD51D, SMAD4, STK11, TP53, VHL, and XRCC2.     04/06/2015 - 05/21/2015 Radiation Therapy   Adjuvant RT Pablo Ledger): Left breast/ 45 Gy over 25 fractions.   Left supraclavicular fossa and axilla/ 45 Gy over 25 fractions. Left breast boost/ 16 Gy over 8 fractions. Total dose: 61 Gy    06/04/2015 - 08/07/2015 Anti-estrogen oral therapy   Exemestane 25 mg daily.  Planned duration of therapy 5 years.     07/23/2015 Survivorship   Survivorship care plan completed and mailed to patient in lieu of in person visit    08/06/2015 Adverse Reaction   Severe hot flashes and mood swings    08/08/2015 - 12/07/2015 Anti-estrogen oral therapy   Arimidex    12/04/2015 Adverse Reaction   Worsening depression, patient stopped Arimidex    12/07/2015 -  Anti-estrogen oral therapy   Tamoxifen    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[place])  07/30/2021 Initial Diagnosis   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[place]) P 16 positive cT1N1   08/16/2021 - 09/13/2021 Radiation Therapy   Completed definitive radiation   09/30/2021 Pathology Results   [facility] Pathology PDL 1 29%   Squamous cell lung cancer (  HCC)  07/23/2021 Imaging    PET/CT  demonstrated small right glottic lesion is mildly hypermetabolic and consistent with no neoplasm.  PET also demonstrated borderline right level 2 lymph node left supraclavicular, mediastinal and hilar lymphadenopathy and  hypermetabolic left lower lobe pulmonary nodule all suspicious of metastatic focus.  No abdominal pelvic metastatic disease or osseous metastatic disease was appreciated.   08/31/2021 Initial Diagnosis   Squamous cell carcinoma of lung (HCC)    09/08/2021 Cancer Staging   Staging form: Lung, AJCC 8th Edition - Clinical stage from 09/08/2021: Stage IIIB (cT1b, cN3, cM0) - Signed by Lonie Peak, MD on 09/08/2021 Stage prefix: Initial diagnosis    09/30/2021 Pathology Results   [facility] Pathology PDL 1 29%   11/01/2021 -  Chemotherapy   Patient is on Treatment Plan : LUNG Carboplatin / Paclitaxel + XRT q7d         INTERIM HISTORY: Shelia Mcmahon 71 y.o. female returns for a follow up prior to C2D1 of weekly carbo/taxol given concurrent with radiation for squamous  cell carcinoma of the lung. She is accompanied by her friend for this visit.   At  today's visit, [name] reports that she tolerated the first chemotherapy without any significant limitations.  She experienced some fatigue but continued to complete her daily activities on her own.  She experienced mild nausea but denies any vomiting episodes.  She reports having dry mouth and thrush on her tongue.  She denies any abdominal pain and only experienced diarrhea the day of chemotherapy.  She reports diffuse body aches that is not well controlled with over-the-counter analgesics.  She has chronic shortness of breath secondary to COPD.  She is still very hoarse since completing radiation therapy to the larynx.  She denies any fevers, chills, night sweats, chest pain or cough.  She has no other complaints.     MEDICAL HISTORY:  Past Medical History:  Diagnosis Date   Arthritis    Asthma    Asymptomatic varicose veins    Breast cancer (HCC) 06/2014   left   Cancer (HCC)    Supra Glottis   Chronic airway obstruction (HCC)    Complication of anesthesia 2016   difficulty waking up, Oxygen dropped low.   COPD (chronic obstructive  pulmonary disease) (HCC)    Depression    Dyspnea    GERD (gastroesophageal reflux disease)    Hemorrhage rectum    and anus.   Hypertension    Insomnia    Other symptoms involving digestive system(787.99)    Pain    Hx.pain in joint involving pelvic region and thigh; pain in limb   Palpitations    Panic attacks    Personal history of chemotherapy 2016   Personal history of radiation therapy 2016   Pneumonia    Pre-diabetes    Prediabetes    Sinusitis    Symptomatic menopausal or female climacteric states    Varicose veins of both lower extremities with pain     SURGICAL HISTORY: Past Surgical History:  Procedure Laterality Date   BREAST BIOPSY Left 10/2012   BREAST BIOPSY Left 07/01/2014   malignant   BREAST LUMPECTOMY Left 08/20/2014   BRONCHIAL BIOPSY  08/23/2021   Procedure: BRONCHIAL BIOPSIES;  Surgeon: Leslye Peer, MD;  Location: [facility];  Service: Pulmonary;;   BRONCHIAL BRUSHINGS  08/23/2021   Procedure: BRONCHIAL BRUSHINGS;  Surgeon: Leslye Peer, MD;  Location[facility];  Service: Pulmonary;;   BRONCHIAL NEEDLE ASPIRATION BIOPSY  08/23/2021   Procedure: BRONCHIAL NEEDLE ASPIRATION BIOPSIES;  Surgeon: Leslye Peer, MD;  Location: [facility] ENDOSCOPY;  Service: Pulmonary;;   BRONCHIAL WASHINGS  08/23/2021   Procedure: BRONCHIAL WASHINGS;  Surgeon: Leslye Peer, MD;  Location: [facility] ENDOSCOPY;  Service: Pulmonary;;   CESAREAN SECTION     CHOLECYSTECTOMY  1985   COLONOSCOPY  2002   Dr. Cleotis Nipper: internal hemorrhoids, one small rectal polyp, adenomatous path    COLONOSCOPY N/A 11/23/2015   Procedure: COLONOSCOPY;  Surgeon: West Bali, MD;  Location: [facility];  Service: Endoscopy;  Laterality: N/A;  1100 - moved to 10:45 - office to notify   ESOPHAGOGASTRODUODENOSCOPY N/A 11/23/2015   Procedure: ESOPHAGOGASTRODUODENOSCOPY (EGD);  Surgeon: West Bali, MD;  Location: [facility];  Service: Endoscopy;  Laterality: N/A;   IR IMAGING GUIDED PORT  INSERTION  11/08/2021   PORT-A-CATH REMOVAL  02/2015   PORTACATH PLACEMENT Right 09/17/2014   Procedure: INSERTION PORT-A-CATH WITH ULTRASOUND;  Surgeon: Harriette Bouillon, MD;  Location: [facility];  Service: General;  Laterality: Right;  IJ   RADIOACTIVE SEED GUIDED PARTIAL MASTECTOMY WITH AXILLARY SENTINEL LYMPH NODE BIOPSY Left 08/20/2014   Procedure: LEFT BREAST LUMPECTOMY WITH RADIOACTIVE SEED LOCALIZATION AND SENTINEL LYMPH NODE MAPPING;  Surgeon: Harriette Bouillon, MD;  Location: [facility];  Service: General;  Laterality: Left;   RE-EXCISION OF BREAST LUMPECTOMY Left 09/17/2014   Procedure: RE-EXCISION OF BREAST LUMPECTOMY;  Surgeon: Harriette Bouillon, MD;  Location: [facility];  Service: General;  Laterality: Left;   TOTAL HIP ARTHROPLASTY Right 02/22/2021   Procedure: RIGHT TOTAL HIP ARTHROPLASTY ANTERIOR APPROACH;  Surgeon: Eldred Manges, MD;  Location: [facility] OR;  Service: Orthopedics;  Laterality: Right;   TUBAL LIGATION     VIDEO BRONCHOSCOPY WITH ENDOBRONCHIAL ULTRASOUND N/A 08/23/2021   Procedure: VIDEO BRONCHOSCOPY WITH ENDOBRONCHIAL ULTRASOUND;  Surgeon: Leslye Peer, MD;  Location: [facility];  Service: Pulmonary;  Laterality: N/A;   VIDEO BRONCHOSCOPY WITH RADIAL ENDOBRONCHIAL ULTRASOUND  08/23/2021   Procedure: VIDEO BRONCHOSCOPY WITH RADIAL ENDOBRONCHIAL ULTRASOUND;  Surgeon: Leslye Peer, MD;  Location: [facility];  Service: Pulmonary;;    SOCIAL HISTORY: Social History   Socioeconomic History   Marital status: Divorced    Spouse name: Not on file   Number of children: 2   Years of education: Not on file   Highest education level: Not on file  Occupational History   Not on file  Tobacco Use   Smoking status: Former    Packs/day: 1.00    Years: 42.00    Pack years: 42.00    Types: Cigarettes    Quit date: 02/22/2021    Years since quitting: 0.7   Smokeless tobacco: Never  Vaping Use   Vaping Use: Never used  Substance and  Sexual Activity   Alcohol use: No    Alcohol/week: 0.0 standard drinks   Drug use: No   Sexual activity: Never    Birth control/protection: None  Other Topics Concern   Not on file  Social History Narrative   Not on file   Social Determinants of Health   Financial Resource Strain: Not on file  Food Insecurity: Not on file  Transportation Needs: Not on file  Physical Activity: Not on file  Stress: Not on file  Social Connections: Unknown   Frequency of Communication with Friends and Family: More than three times a week   Frequency of Social Gatherings with Friends and Family: More than three times a  week   Attends Religious Services: Not on file   Active Member of Clubs or Organizations: Not on file   Attends Banker Meetings: Not on file   Marital Status: Not on file  Intimate Partner Violence: Not on file    FAMILY HISTORY: Family History  Problem Relation Age of Onset   Diabetes Mother    Ovarian cancer Mother 31   Other Father        died in MVA at age 39   Other Sister        mitral valve disorder   Melanoma Sister 92       removed from back of leg   Colon cancer Brother        early 79s, succumbed to disease   Cancer Paternal Aunt        leukemia, bone, or lung cancer   Alzheimer's disease Maternal Grandmother    Heart attack Maternal Grandfather    Heart attack Paternal Grandmother    COPD Paternal Grandfather    Emphysema Paternal Grandfather    Congestive Heart Failure Paternal Aunt    Stomach cancer Paternal Uncle    Kidney cancer Maternal Uncle    Cancer Cousin        dx. teens/dx. 40s   Cancer Cousin    Colon cancer Cousin     ALLERGIES:  has No Known Allergies.  MEDICATIONS:  Current Outpatient Medications  Medication Sig Dispense Refill   albuterol (PROVENTIL HFA;VENTOLIN HFA) 108 (90 BASE) MCG/ACT inhaler Inhale 2 puffs into the lungs every 6 (six) hours as needed for wheezing or shortness of breath.      ALPRAZolam (XANAX) 0.5  MG tablet Take 0.5 mg by mouth 3 (three) times daily.     ANORO ELLIPTA 62.5-25 MCG/INH AEPB Inhale 1 puff into the lungs daily.     aspirin EC 325 MG EC tablet Take 1 tablet (325 mg total) by mouth daily with breakfast. Must take at least 4 weeks postop for DVT prophylaxis 30 tablet 0   calcium carbonate (TUMS - DOSED IN MG ELEMENTAL CALCIUM) 500 MG chewable tablet Chew 2 tablets by mouth 3 (three) times daily as needed for indigestion or heartburn.     fluconazole (DIFLUCAN) 100 MG tablet Take 2 tablets today, then 1 tablet daily x 20 more days. 22 tablet 0   furosemide (LASIX) 20 MG tablet Take 20 mg by mouth daily.     hydrochlorothiazide (HYDRODIURIL) 12.5 MG tablet Take 12.5 mg by mouth daily.     HYDROcodone-acetaminophen (NORCO) 5-325 MG tablet Take 1 tablet by mouth every 6 (six) hours as needed for moderate pain. 30 tablet 0   ibuprofen (ADVIL) 800 MG tablet Take 800 mg by mouth every 8 (eight) hours as needed for moderate pain.     lidocaine (XYLOCAINE) 2 % solution Patient: Mix 1part 2% viscous lidocaine, 1part H20. Swallow 12mL of diluted mixture, before meals and at bedtime, up to QID for sore throat. 200 mL 3   lidocaine-prilocaine (EMLA) cream Apply 1 application. topically as needed. 30 g 0   Menthol, Topical Analgesic, (BIOFREEZE EX) Apply 1 application topically daily as needed (pain).     nystatin (MYCOSTATIN) 100000 UNIT/ML suspension Take 5 mLs (500,000 Units total) by mouth 4 (four) times daily. 60 mL 0   ondansetron (ZOFRAN) 8 MG tablet Take 1 tablet (8 mg total) by mouth every 8 (eight) hours as needed for nausea or vomiting. 60 tablet 0   pantoprazole (PROTONIX) 40  MG tablet Take 1 tablet by mouth once daily 90 tablet 0   potassium chloride (KLOR-CON M) 10 MEQ tablet Take 10 mEq by mouth daily. Patient stated stated that she was started on 20 meq daily in August.     potassium chloride (MICRO-K) 10 MEQ CR capsule Take 2 capsules (20 mEq total) by mouth 2 (two) times  daily. 120 capsule 2   prochlorperazine (COMPAZINE) 10 MG tablet Take 1 tablet (10 mg total) by mouth every 6 (six) hours as needed for nausea or vomiting. 60 tablet 0   sertraline (ZOLOFT) 100 MG tablet Take 100 mg by mouth daily.     No current facility-administered medications for this visit.   Facility-Administered Medications Ordered in Other Visits  Medication Dose Route Frequency Provider Last Rate Last Admin   CARBOplatin (PARAPLATIN) 210 mg in sodium chloride 0.9 % 250 mL chemo infusion  210 mg Intravenous Once Iruku, Praveena, MD       heparin lock flush 100 unit/mL  500 Units Intracatheter Once PRN Iruku, Burnice Logan, MD       PACLitaxel (TAXOL) 96 mg in sodium chloride 0.9 % 250 mL chemo infusion (</= 80mg /m2)  45 mg/m2 (Treatment Plan Recorded) Intravenous Once [name], MD 266 mL/hr at 11/09/21 1554 96 mg at 11/09/21 1554   sodium chloride flush (NS) 0.9 % injection 10 mL  10 mL Intracatheter PRN Iruku, Burnice Logan, MD        REVIEW OF SYSTEMS:   Constitutional: ( - ) fevers, ( - )  chills , ( - ) night sweats Eyes: ( - ) blurriness of vision, ( - ) double vision, ( - ) watery eyes Ears, nose, mouth, throat, and face: ( - ) mucositis, ( - ) sore throat Respiratory: ( - ) cough, ( +) dyspnea, ( - ) wheezes Cardiovascular: ( - ) palpitation, ( - ) chest discomfort, ( - ) lower extremity swelling Gastrointestinal:  ( + ) nausea, ( - ) heartburn, ( + ) change in bowel habits Skin: ( - ) abnormal skin rashes Lymphatics: ( - ) new lymphadenopathy, ( - ) easy bruising Neurological: ( - ) numbness, ( - ) tingling, ( - ) new weaknesses Behavioral/Psych: ( - ) mood change, ( - ) new changes  All other systems were reviewed with the patient and are negative.  PHYSICAL EXAMINATION: ECOG PERFORMANCE STATUS: 1 - Symptomatic but completely ambulatory  Vitals:   11/09/21 1240  BP: 136/77  Pulse: 72  Resp: 18  Temp: 97.7 F (36.5 C)  SpO2: 94%   Filed Weights   11/09/21 1240   Weight: 215 lb 3.2 oz (97.6 kg)    GENERAL: well appearing female in NAD  SKIN: skin color, texture, turgor are normal, no rashes or significant lesions EYES: conjunctiva are pink and non-injected, sclera clear OROPHARYNX: no erythema; lips, buccal mucosa, and tongue normal. Thrush on tongue.  NECK: supple, non-tender LYMPH:  no palpable lymphadenopathy in the cervical or supraclavicular lymph nodes.  LUNGS: clear to auscultation and percussion with normal breathing effort HEART: regular rate & rhythm and no murmurs and no lower extremity edema Musculoskeletal: no cyanosis of digits and no clubbing  PSYCH: alert & oriented x 3, fluent speech NEURO: no focal motor/sensory deficits  LABORATORY DATA:  I have reviewed the data as listed    Latest Ref Rng & Units 11/08/2021    2:41 PM 11/01/2021    8:07 AM 09/24/2021    1:07 PM  CBC  WBC 4.0 -  10.5 K/uL 4.9   6.2   6.6    Hemoglobin 12.0 - 15.0 g/dL 16.1   09.6   04.5    Hematocrit 36.0 - 46.0 % 36.3   39.5   38.2    Platelets 150 - 400 K/uL 194   223   200         Latest Ref Rng & Units 11/09/2021   12:39 PM 11/08/2021    2:41 PM 11/01/2021    8:07 AM  CMP  Glucose 70 - 99 mg/dL 409   811   914    BUN 8 - 23 mg/dL Creatinine 0.44 - 1.00 mg/dL 7.82   9.56   2.13    Sodium 135 - 145 mmol/L 141   139   142    Potassium 3.5 - 5.1 mmol/L 2.9   2.7   2.4    Chloride 98 - 111 mmol/L 100   97   99    CO2 22 - 32 mmol/L 35   37   35    Calcium 8.9 - 10.3 mg/dL 9.3   9.4   9.5    Total Protein 6.5 - 8.1 g/dL 6.9   7.0   7.0    Total Bilirubin 0.3 - 1.2 mg/dL 1.2   1.2   1.0    Alkaline Phos 38 - 126 U/L 68   72   79    AST 15 - 41 U/L ALT 0 - 44 U/L RADIOGRAPHIC STUDIES: I have personally reviewed the radiological images as listed and agreed with the findings in the report. CT Soft Tissue Neck W Contrast  Result Date: 10/20/2021 CLINICAL DATA:  Head neck cancer monitoring EXAM: CT NECK  WITH CONTRAST TECHNIQUE: Multidetector CT imaging of the neck was performed using the standard protocol following the bolus administration of intravenous contrast. RADIATION DOSE REDUCTION: This exam was performed according to the departmental dose-optimization program which includes automated exposure control, adjustment of the mA and/or kV according to patient size and/or use of iterative reconstruction technique. CONTRAST:  80mL OMNIPAQUE IOHEXOL 300 MG/ML  SOLN COMPARISON:  PET CT 07/23/2021.  Neck CT 06/23/2021 FINDINGS: Pharynx and larynx: Exophytic lesion seen on prior at the right glottis is no longer identified. There is a nodular area of accentuated enhancement at the anterior supraglottic larynx, 9 x 5 mm. No submucosal nodularity. Salivary glands: Unremarkable Thyroid: 3.5 cm right thyroid nodule, unchanged in without invasive features. Patient is undergoing active surveillance. Lymph nodes: Both jugulodigastric nodes measure larger but retain normal morphology with fatty hila and are at the upper range of normal length (15 mm). Suspect this is reactive enlargement given the symmetry, only the right side was borderline warm on prior PET. No heterogeneity of nodes in the neck Vascular: Carotid atheromatous calcifications. Limited intracranial: Negative Visualized orbits: Not covered Mastoids and visualized paranasal sinuses: Clear Skeleton: No acute or aggressive finding. Upper chest: Reported separately.  Upper mediastinal adenopathy. IMPRESSION: 1. Resolved polypoid lesion of the larynx. Mucosal prominence along the midline supraglottic larynx is likely stable and non tumoral, this should be apparent on mucosal exam. 2. No worrisome nodes in the neck. 3. Mediastinal adenopathy, reference dedicated chest CT. Electronically Signed   By: Tiburcio Pea M.D.   On: 10/20/2021 07:33   CT Chest W Contrast  Result Date: 10/20/2021 CLINICAL DATA:  71 year old female with history of head neck cancer. Follow-up  study. EXAM: CT CHEST WITH CONTRAST TECHNIQUE: Multidetector CT imaging of the chest was performed during intravenous contrast administration. RADIATION DOSE REDUCTION: This exam was performed according to the departmental dose-optimization program which includes automated exposure control, adjustment of the mA and/or kV according to patient size and/or use of iterative reconstruction technique. CONTRAST:  80mL OMNIPAQUE IOHEXOL 300 MG/ML  SOLN COMPARISON:  Chest CT 07/23/2021.  PET-CT 07/23/2021. FINDINGS: Cardiovascular: Heart size is normal. There is no significant pericardial fluid, thickening or pericardial calcification. There is aortic atherosclerosis, as well as atherosclerosis of the great vessels of the mediastinum and the coronary arteries, including calcified atherosclerotic plaque in the left main, left anterior descending and right coronary arteries. Calcifications of the aortic valve and mitral annulus Mediastinum/Nodes: There are multiple enlarged mediastinal lymph nodes, including a nodal conglomerate in the subcarinal nodal station measuring 2.1 x 3.4 cm (axial image 24 of series 3) and multiple other enlarged lymph nodes measuring up to 2.0 cm in the right paratracheal nodal stations (axial images 15 and 16 of series 3). Esophagus is unremarkable in appearance. No axillary lymphadenopathy. Asymmetric mass-like enlargement and heterogeneity in the right lobe of the thyroid gland which measures 4.1 x 3.2 cm. Lungs/Pleura: Today's study is limited by considerable patient respiratory motion. With these limitations in mind, the previously noted left lower lobe pulmonary nodule (axial image 85 of series 6) currently measures 1.1 x 1.0 cm (previously 1.1 x 0.7 cm). A few other scattered smaller pulmonary nodules are also noted elsewhere in the lungs, similar to the prior study but partially obscured by motion. No confluent consolidative airspace disease. No pleural effusions. Diffuse bronchial wall  thickening with mild centrilobular and paraseptal emphysema. Upper Abdomen: Status post cholecystectomy. Musculoskeletal: There are no aggressive appearing lytic or blastic lesions noted in the visualized portions of the skeleton. IMPRESSION: 1. Slight interval enlargement of left lower lobe pulmonary nodule which currently measures 1.1 x 1.0 cm. 2. Extensive mediastinal lymphadenopathy appears very similar to the prior examination, as detailed above. 3. Asymmetrically enlarged right lobe of thyroid gland, similar to the prior study, previously evaluated by thyroid ultrasound. Correlation with results of fine-needle aspiration is recommended. 4. Aortic atherosclerosis, in addition to left main and 2 vessel coronary artery disease. Please note that although the presence of coronary artery calcium documents the presence of coronary artery disease, the severity of this disease and any potential stenosis cannot be assessed on this non-gated CT examination. Assessment for potential risk factor modification, dietary therapy or pharmacologic therapy may be warranted, if clinically indicated. 5. There are calcifications of the aortic valve and mitral annulus. Echocardiographic correlation for evaluation of potential valvular dysfunction may be warranted if clinically indicated. 6. Mild diffuse bronchial wall thickening with mild centrilobular and paraseptal emphysema; imaging findings suggestive of underlying COPD Aortic Atherosclerosis (ICD10-I70.0) and Emphysema (ICD10-J43.9). Electronically Signed   By: Trudie Reed M.D.   On: 10/20/2021 07:36   IR IMAGING GUIDED PORT INSERTION  Result Date: 11/08/2021 CLINICAL DATA:  Lung carcinoma, history of bilateral axillary lymph node dissection, needs durable venous access for treatment regimen EXAM: TUNNELED PORT CATHETER PLACEMENT WITH ULTRASOUND AND FLUOROSCOPIC GUIDANCE FLUOROSCOPY: Radiation Exposure Index (as provided by the fluoroscopic device): 44 mGy air Kerma  ANESTHESIA/SEDATION: Intravenous Fentanyl and Versed  were administered as conscious sedation during continuous monitoring of the patient's level of consciousness and physiological / cardiorespiratory status by the radiology  RN, with a total moderate sedation time of 28 minutes. TECHNIQUE: The procedure, risks, benefits, and alternatives were explained to the patient. Questions regarding the procedure were encouraged and answered. The patient understands and consents to the procedure. Patency of the right IJ vein was confirmed with ultrasound with image documentation. An appropriate skin site was determined. Skin site was marked. Region was prepped using maximum barrier technique including cap and mask, sterile gown, sterile gloves, large sterile sheet, and Chlorhexidine as cutaneous antisepsis. The region was infiltrated locally with 1% lidocaine. Under real-time ultrasound guidance, the right IJ vein was accessed with a 21 gauge micropuncture needle; the needle tip within the vein was confirmed with ultrasound image documentation. The guidewire would not advance centrally. 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 The transitional dilator set was utilized through which an angled stiff glidewire was advanced through the stenotic right IJ vein and advanced into the IVC. A 5 French angiographic catheter was placed. Vascular measurement was performed. A small incision was made on the right anterior chest wall and a subcutaneous pocket fashioned. The power-injectable port was positioned and its catheter tunneled to the right IJ dermatotomy site. The angiographic catheter was exchanged over an Amplatz wire for a peel-away sheath, through which the port catheter, which had been trimmed to the appropriate length, was advanced and positioned under fluoroscopy with its tip at the cavoatrial junction. Spot chest radiograph confirms good  catheter position and no pneumothorax. The port was flushed per protocol. The pocket was closed with deep interrupted and subcuticular continuous 3-0 Monocryl sutures. The incisions were covered with Dermabond then covered with a sterile dressing. The patient tolerated the procedure well. COMPLICATIONS: COMPLICATIONS None immediate IMPRESSION: Technically successful right IJ power-injectable port catheter placement. Ready for routine use. Electronically Signed   By: Corlis Leak M.D.   On: 11/08/2021 14:50    ASSESSMENT & PLAN Regina Mcmahon is a 71 y.o. female who returns for a follow up.  Malignant neoplasm of supraglottis --Thyroid US on 06/03/21 revealed a suspicious nodule located in the mid right thyroid lobe, measuring 5.0 cm in the greatest extent --Underwent laryngoscopy on 06/07/2021 which revealed a granular mass of the right glottis suggestive of glottic cancer. CT neck from 06/23/2021 showed 9 mm nodule right vocal cord. No enlarged lymph nodes were appreciated in the neck. An enlarged right paratracheal lymph node was appreciated as well, noted as suspicious for metastatic disease, as well as the right thyroid nodule measuring 39 x 34 mm.  --Proceeded with biopsy of the right vocal cord mass on 06/25/21 which revealed squamous cell carcinoma; p16 positive. --PET on 07/23/21 demonstrated the small right glottic lesion as mildly hypermetabolic and consistent with known neoplasm. PET also demonstrated: a borderline right level 2 lymph node; left supraclavicular, mediastinal and hilar lymphadenopathy; and a hypermetabolic left lower lobe pulmonary nodule, noted as likely a metastatic focus. No other findings for abdominal/pelvic metastatic disease or osseous metastatic disease were appreciated.  --Received definitive radiation from 08/16/2021 through 09/13/2021. 54 Gy in 20 Fx.  --Post treatment CT neck from 10/18/2021 showed resolved polypoid lesion of the larynx. No worrisome nodes in the  neck.   2. 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 --Underwent EBUS with biopsy of left lower lobe pulmonary nodule, Level 4R and 7 lymph nodes. Pathology confirmed squamous cell carcinoma of left lower lobe nodule along with level 4R and 7 level nodules --Started concurrent chemoradiation with weekly carbo/taxol on 11/01/2021. -- PD-L1 29% hence she is not a candidate for single agent immunotherapy.  We will consider adjuvant immunotherapy after completion of concurrent chemoradiation for 1 year.  3. 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 --She is due for mammogram, will plan to schedule once lung cancer therapy is complete  4. Hypokalemia: --Secondary to diarrhea and lasix.  --Previously took potassium chloride 20 mEq once daily and increased to twice a day starting yesterday --Potassium level is 2.9 today so gave 40 mEq of oral potassium today with treatment.  --Recommend to continue potassium chloride 20 mEq twice daily.   5. Nausea: --Sent prescription for Zofran and Compazine. Advised to avoid taking Compazine and Xanax simultaneously to avoid sedation  6.  Diffuse body aches: -Currently takes extra strength Tylenol with no relief of pain. -Sent prescription of Norco 5-325 mg q 6 hours PRN. Advised to avoid taking Norco and Xanax simultaneously to avoid sedation  7. Oral  thrush: --Sent Nystatin mouthwash.   8. Diarrhea: --Secondary to chemotherapy --Advised to take imodium as needed and stay hydrated  9. Port-a-cath: --Port placement on 11/08/2021 --Sent EMLA cream today   No orders of the defined types were placed in this encounter.   All questions were answered. The patient knows to call the clinic with any problems, questions or concerns.  I have spent a total of 30 minutes minutes of face-to-face and non-face-to-face time, preparing to see the patient,  performing a medically appropriate examination, counseling and educating the patient, ordering medications/tests, documenting clinical information in the electronic health record,  and care coordination.   Georga Kaufmann, PA-C Department of Hematology/Oncology [facility] Phone: [phone]

## 2021-11-09 NOTE — Telephone Encounter (Signed)
Per Dr. Chryl Heck directions - remind patient to take potassium consistently. Patient has appt at [facility] today prior to her infusion with Dede Query, PA - she states she will review meds and educate patient.  ?

## 2021-11-09 NOTE — Progress Notes (Signed)
Pt's potassium is 2.9 mmol/L today. Murray Hodgkins, [place] ordered 40 mEq of oral K to be given today in inf.  ?See MAR for administration of PO potassium.  ?

## 2021-11-10 ENCOUNTER — Other Ambulatory Visit: Payer: Self-pay

## 2021-11-10 ENCOUNTER — Ambulatory Visit
Admission: RE | Admit: 2021-11-10 | Discharge: 2021-11-10 | Disposition: A | Discharge status: Home / Self Care | Payer: Medicare HMO | Source: Ambulatory Visit | Attending: Radiation Oncology | Admitting: Radiation Oncology

## 2021-11-10 DIAGNOSIS — Z87891 Personal history of nicotine dependence: Secondary | ICD-10-CM | POA: Diagnosis not present

## 2021-11-10 DIAGNOSIS — C320 Malignant neoplasm of glottis: Secondary | ICD-10-CM | POA: Diagnosis not present

## 2021-11-10 DIAGNOSIS — Z510 Encounter for antineoplastic radiation therapy: Secondary | ICD-10-CM | POA: Diagnosis not present

## 2021-11-10 DIAGNOSIS — C3432 Malignant neoplasm of lower lobe, left bronchus or lung: Secondary | ICD-10-CM | POA: Diagnosis not present

## 2021-11-10 LAB — RAD ONC ARIA SESSION SUMMARY
Course Elapsed Days: 7
Plan Fractions Treated to Date: 6
Plan Prescribed Dose Per Fraction: 2 Gy
Plan Total Fractions Prescribed: 30
Plan Total Prescribed Dose: 60 Gy
Reference Point Dosage Given to Date: 12 Gy
Reference Point Session Dosage Given: 2 Gy
Session Number: 6

## 2021-11-11 ENCOUNTER — Ambulatory Visit
Admission: RE | Admit: 2021-11-11 | Discharge: 2021-11-11 | Disposition: A | Discharge status: Home / Self Care | Payer: Medicare HMO | Source: Ambulatory Visit | Attending: Radiation Oncology | Admitting: Radiation Oncology

## 2021-11-11 ENCOUNTER — Other Ambulatory Visit: Payer: Self-pay

## 2021-11-11 DIAGNOSIS — Z510 Encounter for antineoplastic radiation therapy: Secondary | ICD-10-CM | POA: Diagnosis not present

## 2021-11-11 DIAGNOSIS — Z87891 Personal history of nicotine dependence: Secondary | ICD-10-CM | POA: Diagnosis not present

## 2021-11-11 DIAGNOSIS — C3432 Malignant neoplasm of lower lobe, left bronchus or lung: Secondary | ICD-10-CM | POA: Diagnosis not present

## 2021-11-11 DIAGNOSIS — C320 Malignant neoplasm of glottis: Secondary | ICD-10-CM | POA: Diagnosis not present

## 2021-11-11 LAB — RAD ONC ARIA SESSION SUMMARY
Course Elapsed Days: 8
Plan Fractions Treated to Date: 7
Plan Prescribed Dose Per Fraction: 2 Gy
Plan Total Fractions Prescribed: 30
Plan Total Prescribed Dose: 60 Gy
Reference Point Dosage Given to Date: 14 Gy
Reference Point Session Dosage Given: 2 Gy
Session Number: 7

## 2021-11-12 ENCOUNTER — Other Ambulatory Visit: Payer: Self-pay

## 2021-11-12 ENCOUNTER — Other Ambulatory Visit: Payer: Self-pay | Admitting: Radiation Oncology

## 2021-11-12 ENCOUNTER — Ambulatory Visit
Admission: RE | Admit: 2021-11-12 | Discharge: 2021-11-12 | Disposition: A | Discharge status: Home / Self Care | Payer: Medicare HMO | Source: Ambulatory Visit | Attending: Radiation Oncology | Admitting: Radiation Oncology

## 2021-11-12 ENCOUNTER — Other Ambulatory Visit: Payer: Self-pay | Admitting: Nurse Practitioner

## 2021-11-12 DIAGNOSIS — Z510 Encounter for antineoplastic radiation therapy: Secondary | ICD-10-CM | POA: Diagnosis not present

## 2021-11-12 DIAGNOSIS — C320 Malignant neoplasm of glottis: Secondary | ICD-10-CM | POA: Diagnosis not present

## 2021-11-12 DIAGNOSIS — C3432 Malignant neoplasm of lower lobe, left bronchus or lung: Secondary | ICD-10-CM

## 2021-11-12 DIAGNOSIS — Z87891 Personal history of nicotine dependence: Secondary | ICD-10-CM | POA: Diagnosis not present

## 2021-11-12 LAB — RAD ONC ARIA SESSION SUMMARY
Course Elapsed Days: 9
Plan Fractions Treated to Date: 8
Plan Prescribed Dose Per Fraction: 2 Gy
Plan Total Fractions Prescribed: 30
Plan Total Prescribed Dose: 60 Gy
Reference Point Dosage Given to Date: 16 Gy
Reference Point Session Dosage Given: 2 Gy
Session Number: 8

## 2021-11-12 MED ORDER — SUCRALFATE 1 G PO TABS: ORAL_TABLET | ORAL | 3 refills | Status: DC

## 2021-11-15 ENCOUNTER — Other Ambulatory Visit: Payer: Self-pay

## 2021-11-15 ENCOUNTER — Ambulatory Visit
Admission: RE | Admit: 2021-11-15 | Discharge: 2021-11-15 | Disposition: A | Discharge status: Home / Self Care | Payer: Medicare HMO | Source: Ambulatory Visit | Attending: Radiation Oncology | Admitting: Radiation Oncology

## 2021-11-15 ENCOUNTER — Inpatient Hospital Stay: Payer: Medicare HMO

## 2021-11-15 DIAGNOSIS — Z5111 Encounter for antineoplastic chemotherapy: Secondary | ICD-10-CM | POA: Diagnosis not present

## 2021-11-15 DIAGNOSIS — K521 Toxic gastroenteritis and colitis: Secondary | ICD-10-CM | POA: Diagnosis not present

## 2021-11-15 DIAGNOSIS — I700 Atherosclerosis of aorta: Secondary | ICD-10-CM | POA: Diagnosis not present

## 2021-11-15 DIAGNOSIS — Z79811 Long term (current) use of aromatase inhibitors: Secondary | ICD-10-CM | POA: Diagnosis not present

## 2021-11-15 DIAGNOSIS — Z87891 Personal history of nicotine dependence: Secondary | ICD-10-CM | POA: Diagnosis not present

## 2021-11-15 DIAGNOSIS — J449 Chronic obstructive pulmonary disease, unspecified: Secondary | ICD-10-CM | POA: Diagnosis not present

## 2021-11-15 DIAGNOSIS — Z510 Encounter for antineoplastic radiation therapy: Secondary | ICD-10-CM | POA: Diagnosis not present

## 2021-11-15 DIAGNOSIS — Z8041 Family history of malignant neoplasm of ovary: Secondary | ICD-10-CM | POA: Diagnosis not present

## 2021-11-15 DIAGNOSIS — Z833 Family history of diabetes mellitus: Secondary | ICD-10-CM | POA: Diagnosis not present

## 2021-11-15 DIAGNOSIS — C50412 Malignant neoplasm of upper-outer quadrant of left female breast: Secondary | ICD-10-CM | POA: Diagnosis not present

## 2021-11-15 DIAGNOSIS — R110 Nausea: Secondary | ICD-10-CM | POA: Diagnosis not present

## 2021-11-15 DIAGNOSIS — C3432 Malignant neoplasm of lower lobe, left bronchus or lung: Secondary | ICD-10-CM | POA: Diagnosis not present

## 2021-11-15 DIAGNOSIS — T451X5A Adverse effect of antineoplastic and immunosuppressive drugs, initial encounter: Secondary | ICD-10-CM | POA: Diagnosis not present

## 2021-11-15 DIAGNOSIS — Z923 Personal history of irradiation: Secondary | ICD-10-CM | POA: Diagnosis not present

## 2021-11-15 DIAGNOSIS — R682 Dry mouth, unspecified: Secondary | ICD-10-CM | POA: Diagnosis not present

## 2021-11-15 DIAGNOSIS — C3490 Malignant neoplasm of unspecified part of unspecified bronchus or lung: Secondary | ICD-10-CM

## 2021-11-15 DIAGNOSIS — C321 Malignant neoplasm of supraglottis: Secondary | ICD-10-CM | POA: Diagnosis not present

## 2021-11-15 DIAGNOSIS — Z170 Estrogen receptor positive status [ER+]: Secondary | ICD-10-CM | POA: Diagnosis not present

## 2021-11-15 DIAGNOSIS — Z9049 Acquired absence of other specified parts of digestive tract: Secondary | ICD-10-CM | POA: Diagnosis not present

## 2021-11-15 DIAGNOSIS — R5383 Other fatigue: Secondary | ICD-10-CM | POA: Diagnosis not present

## 2021-11-15 DIAGNOSIS — Z9221 Personal history of antineoplastic chemotherapy: Secondary | ICD-10-CM | POA: Diagnosis not present

## 2021-11-15 DIAGNOSIS — R0600 Dyspnea, unspecified: Secondary | ICD-10-CM | POA: Diagnosis not present

## 2021-11-15 DIAGNOSIS — C320 Malignant neoplasm of glottis: Secondary | ICD-10-CM | POA: Diagnosis not present

## 2021-11-15 DIAGNOSIS — E876 Hypokalemia: Secondary | ICD-10-CM | POA: Diagnosis not present

## 2021-11-15 DIAGNOSIS — Z79899 Other long term (current) drug therapy: Secondary | ICD-10-CM | POA: Diagnosis not present

## 2021-11-15 DIAGNOSIS — R490 Dysphonia: Secondary | ICD-10-CM | POA: Diagnosis not present

## 2021-11-15 DIAGNOSIS — B370 Candidal stomatitis: Secondary | ICD-10-CM | POA: Diagnosis not present

## 2021-11-15 LAB — CBC WITH DIFFERENTIAL/PLATELET
Abs Immature Granulocytes: 0.02 10*3/uL (ref 0.00–0.07)
Basophils Absolute: 0 10*3/uL (ref 0.0–0.1)
Basophils Relative: 1 %
Eosinophils Absolute: 0.1 10*3/uL (ref 0.0–0.5)
Eosinophils Relative: 3 %
HCT: 37.2 % (ref 36.0–46.0)
Hemoglobin: 12.8 g/dL (ref 12.0–15.0)
Immature Granulocytes: 1 %
Lymphocytes Relative: 13 %
Lymphs Abs: 0.4 10*3/uL — ABNORMAL LOW (ref 0.7–4.0)
MCH: 30.6 pg (ref 26.0–34.0)
MCHC: 34.4 g/dL (ref 30.0–36.0)
MCV: 89 fL (ref 80.0–100.0)
Monocytes Absolute: 0.2 10*3/uL (ref 0.1–1.0)
Monocytes Relative: 5 %
Neutro Abs: 2.3 10*3/uL (ref 1.7–7.7)
Neutrophils Relative %: 77 %
Platelets: 174 10*3/uL (ref 150–400)
RBC: 4.18 MIL/uL (ref 3.87–5.11)
RDW: 12.7 % (ref 11.5–15.5)
WBC: 3 10*3/uL — ABNORMAL LOW (ref 4.0–10.5)
nRBC: 0 % (ref 0.0–0.2)

## 2021-11-15 LAB — CMP (CANCER CENTER ONLY)
ALT: 6 U/L (ref 0–44)
AST: 10 U/L — ABNORMAL LOW (ref 15–41)
Albumin: 4 g/dL (ref 3.5–5.0)
Alkaline Phosphatase: 77 U/L (ref 38–126)
Anion gap: 4 — ABNORMAL LOW (ref 5–15)
BUN: 18 mg/dL (ref 8–23)
CO2: 34 mmol/L — ABNORMAL HIGH (ref 22–32)
Calcium: 9.7 mg/dL (ref 8.9–10.3)
Chloride: 103 mmol/L (ref 98–111)
Creatinine: 0.77 mg/dL (ref 0.44–1.00)
GFR, Estimated: >60 mL/min (ref >60)
Glucose, Bld: 103 mg/dL — ABNORMAL HIGH (ref 70–99)
Potassium: 4.9 mmol/L (ref 3.5–5.1)
Sodium: 141 mmol/L (ref 135–145)
Total Bilirubin: 1.5 mg/dL — ABNORMAL HIGH (ref 0.3–1.2)
Total Protein: 7.1 g/dL (ref 6.5–8.1)

## 2021-11-15 LAB — RAD ONC ARIA SESSION SUMMARY
Course Elapsed Days: 12
Plan Fractions Treated to Date: 9
Plan Prescribed Dose Per Fraction: 2 Gy
Plan Total Fractions Prescribed: 30
Plan Total Prescribed Dose: 60 Gy
Reference Point Dosage Given to Date: 18 Gy
Reference Point Session Dosage Given: 2 Gy
Session Number: 9

## 2021-11-16 ENCOUNTER — Other Ambulatory Visit: Payer: Self-pay

## 2021-11-16 ENCOUNTER — Ambulatory Visit
Admission: RE | Admit: 2021-11-16 | Discharge: 2021-11-16 | Disposition: A | Discharge status: Home / Self Care | Payer: Medicare HMO | Source: Ambulatory Visit | Attending: Radiation Oncology | Admitting: Radiation Oncology

## 2021-11-16 ENCOUNTER — Inpatient Hospital Stay: Payer: Medicare HMO | Admitting: Nurse Practitioner

## 2021-11-16 ENCOUNTER — Inpatient Hospital Stay: Payer: Medicare HMO | Admitting: Nutrition

## 2021-11-16 ENCOUNTER — Encounter: Payer: Self-pay | Admitting: Hematology and Oncology

## 2021-11-16 ENCOUNTER — Inpatient Hospital Stay (HOSPITAL_BASED_OUTPATIENT_CLINIC_OR_DEPARTMENT_OTHER): Payer: Medicare HMO | Admitting: Hematology and Oncology

## 2021-11-16 ENCOUNTER — Inpatient Hospital Stay (HOSPITAL_BASED_OUTPATIENT_CLINIC_OR_DEPARTMENT_OTHER): Payer: Medicare HMO | Admitting: Physician Assistant

## 2021-11-16 ENCOUNTER — Inpatient Hospital Stay: Payer: Medicare HMO

## 2021-11-16 ENCOUNTER — Other Ambulatory Visit: Payer: Self-pay | Admitting: Hematology and Oncology

## 2021-11-16 VITALS — BP 116/80 | HR 90 | Resp 22

## 2021-11-16 VITALS — BP 139/62 | HR 70 | Temp 97.9°F | Resp 18 | Ht 64.0 in | Wt 209.3 lb

## 2021-11-16 DIAGNOSIS — R682 Dry mouth, unspecified: Secondary | ICD-10-CM | POA: Diagnosis not present

## 2021-11-16 DIAGNOSIS — C321 Malignant neoplasm of supraglottis: Secondary | ICD-10-CM

## 2021-11-16 DIAGNOSIS — Z9221 Personal history of antineoplastic chemotherapy: Secondary | ICD-10-CM | POA: Diagnosis not present

## 2021-11-16 DIAGNOSIS — Z79811 Long term (current) use of aromatase inhibitors: Secondary | ICD-10-CM | POA: Diagnosis not present

## 2021-11-16 DIAGNOSIS — Z510 Encounter for antineoplastic radiation therapy: Secondary | ICD-10-CM | POA: Diagnosis not present

## 2021-11-16 DIAGNOSIS — J449 Chronic obstructive pulmonary disease, unspecified: Secondary | ICD-10-CM | POA: Diagnosis not present

## 2021-11-16 DIAGNOSIS — Z87891 Personal history of nicotine dependence: Secondary | ICD-10-CM | POA: Diagnosis not present

## 2021-11-16 DIAGNOSIS — C3490 Malignant neoplasm of unspecified part of unspecified bronchus or lung: Secondary | ICD-10-CM | POA: Diagnosis not present

## 2021-11-16 DIAGNOSIS — R0602 Shortness of breath: Secondary | ICD-10-CM

## 2021-11-16 DIAGNOSIS — B370 Candidal stomatitis: Secondary | ICD-10-CM | POA: Diagnosis not present

## 2021-11-16 DIAGNOSIS — T451X5A Adverse effect of antineoplastic and immunosuppressive drugs, initial encounter: Secondary | ICD-10-CM | POA: Diagnosis not present

## 2021-11-16 DIAGNOSIS — R5383 Other fatigue: Secondary | ICD-10-CM | POA: Diagnosis not present

## 2021-11-16 DIAGNOSIS — Z79899 Other long term (current) drug therapy: Secondary | ICD-10-CM | POA: Diagnosis not present

## 2021-11-16 DIAGNOSIS — E876 Hypokalemia: Secondary | ICD-10-CM | POA: Diagnosis not present

## 2021-11-16 DIAGNOSIS — Z5111 Encounter for antineoplastic chemotherapy: Secondary | ICD-10-CM | POA: Diagnosis not present

## 2021-11-16 DIAGNOSIS — K521 Toxic gastroenteritis and colitis: Secondary | ICD-10-CM | POA: Diagnosis not present

## 2021-11-16 DIAGNOSIS — C50412 Malignant neoplasm of upper-outer quadrant of left female breast: Secondary | ICD-10-CM | POA: Diagnosis not present

## 2021-11-16 DIAGNOSIS — Z170 Estrogen receptor positive status [ER+]: Secondary | ICD-10-CM | POA: Diagnosis not present

## 2021-11-16 DIAGNOSIS — R0600 Dyspnea, unspecified: Secondary | ICD-10-CM | POA: Diagnosis not present

## 2021-11-16 DIAGNOSIS — I700 Atherosclerosis of aorta: Secondary | ICD-10-CM | POA: Diagnosis not present

## 2021-11-16 DIAGNOSIS — C320 Malignant neoplasm of glottis: Secondary | ICD-10-CM | POA: Diagnosis not present

## 2021-11-16 DIAGNOSIS — C3432 Malignant neoplasm of lower lobe, left bronchus or lung: Secondary | ICD-10-CM | POA: Diagnosis not present

## 2021-11-16 DIAGNOSIS — R490 Dysphonia: Secondary | ICD-10-CM | POA: Diagnosis not present

## 2021-11-16 DIAGNOSIS — Z9049 Acquired absence of other specified parts of digestive tract: Secondary | ICD-10-CM | POA: Diagnosis not present

## 2021-11-16 DIAGNOSIS — Z833 Family history of diabetes mellitus: Secondary | ICD-10-CM | POA: Diagnosis not present

## 2021-11-16 DIAGNOSIS — R110 Nausea: Secondary | ICD-10-CM | POA: Diagnosis not present

## 2021-11-16 DIAGNOSIS — Z923 Personal history of irradiation: Secondary | ICD-10-CM | POA: Diagnosis not present

## 2021-11-16 DIAGNOSIS — Z8041 Family history of malignant neoplasm of ovary: Secondary | ICD-10-CM | POA: Diagnosis not present

## 2021-11-16 LAB — RAD ONC ARIA SESSION SUMMARY
Course Elapsed Days: 13
Plan Fractions Treated to Date: 10
Plan Prescribed Dose Per Fraction: 2 Gy
Plan Total Fractions Prescribed: 30
Plan Total Prescribed Dose: 60 Gy
Reference Point Dosage Given to Date: 20 Gy
Reference Point Session Dosage Given: 2 Gy
Session Number: 10

## 2021-11-16 MED ORDER — DIPHENHYDRAMINE HCL 50 MG/ML IJ SOLN
50.0000 mg | Freq: Once | INTRAMUSCULAR | Status: AC
  Administered 2021-11-16: 50 mg via INTRAVENOUS
  Filled 2021-11-16: qty 1

## 2021-11-16 MED ORDER — SODIUM CHLORIDE 0.9 % IV SOLN
10.0000 mg | Freq: Once | INTRAVENOUS | Status: AC
  Administered 2021-11-16: 10 mg via INTRAVENOUS
  Filled 2021-11-16: qty 10

## 2021-11-16 MED ORDER — SODIUM CHLORIDE 0.9% FLUSH
10.0000 mL | INTRAVENOUS | Status: DC | PRN
  Administered 2021-11-16: 10 mL

## 2021-11-16 MED ORDER — ALBUTEROL SULFATE (2.5 MG/3ML) 0.083% IN NEBU
2.5000 mg | INHALATION_SOLUTION | Freq: Once | RESPIRATORY_TRACT | Status: AC | PRN
  Administered 2021-11-16: 2.5 mg via RESPIRATORY_TRACT

## 2021-11-16 MED ORDER — SODIUM CHLORIDE 0.9 % IV SOLN
214.4000 mg | Freq: Once | INTRAVENOUS | Status: AC
  Administered 2021-11-16: 210 mg via INTRAVENOUS
  Filled 2021-11-16: qty 21

## 2021-11-16 MED ORDER — SODIUM CHLORIDE 0.9 % IV SOLN: Freq: Once | INTRAVENOUS | Status: AC

## 2021-11-16 MED ORDER — FAMOTIDINE IN NACL 20-0.9 MG/50ML-% IV SOLN
20.0000 mg | Freq: Once | INTRAVENOUS | Status: AC
  Administered 2021-11-16: 20 mg via INTRAVENOUS
  Filled 2021-11-16: qty 50

## 2021-11-16 MED ORDER — METHYLPREDNISOLONE SODIUM SUCC 125 MG IJ SOLR
125.0000 mg | Freq: Once | INTRAMUSCULAR | Status: AC | PRN
  Administered 2021-11-16: 125 mg via INTRAVENOUS

## 2021-11-16 MED ORDER — PALONOSETRON HCL INJECTION 0.25 MG/5ML
0.2500 mg | Freq: Once | INTRAVENOUS | Status: AC
  Administered 2021-11-16: 0.25 mg via INTRAVENOUS
  Filled 2021-11-16: qty 5

## 2021-11-16 MED ORDER — HEPARIN SOD (PORK) LOCK FLUSH 100 UNIT/ML IV SOLN
500.0000 [IU] | Freq: Once | INTRAVENOUS | Status: AC | PRN
  Administered 2021-11-16: 500 [IU]

## 2021-11-16 MED ORDER — SODIUM CHLORIDE 0.9 % IV SOLN
45.0000 mg/m2 | Freq: Once | INTRAVENOUS | Status: AC
  Administered 2021-11-16: 96 mg via INTRAVENOUS
  Filled 2021-11-16: qty 16

## 2021-11-16 MED ORDER — SODIUM CHLORIDE 0.9 % IV SOLN: Freq: Once | INTRAVENOUS | Status: DC | PRN

## 2021-11-16 NOTE — Progress Notes (Signed)
[facility] [phone]   [phone]  PROGRESS NOTE  Patient Care Team: Regina Squibb, Mcmahon as PCP - General (Internal Medicine) Regina Commons, Mcmahon (Inactive) as PCP - Cardiology (Cardiology) Regina Mcmahon, Regina Mcmahon (Inactive) as Consulting Physician (Hematology and Oncology) Regina Luna, Mcmahon as Consulting Physician (General Surgery) Regina Silversmith, Mcmahon as Consulting Physician (Radiation Oncology) Regina Cheese, NP as Nurse Practitioner (Hematology and Oncology) Regina Binder, Mcmahon (Inactive) as Consulting Physician (Gastroenterology) Malmfelt, Stephani Police, RN as Oncology Nurse Navigator Regina Quitter, Mcmahon as Consulting Physician (Otolaryngology) Regina Gibson, Mcmahon as Consulting Physician (Radiation Oncology) Regina Pike, Mcmahon as Consulting Physician (Hematology and Oncology)  CHIEF COMPLAINTS/PURPOSE OF CONSULTATION:  1.Malignant neoplasm of glottis/supraglottis  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 3. 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     06/24/2014 Breast US   Left breast: ill-defined predominantly hypoechoic area with some increased echogenicity in the 2 o'clock position of the left breast, 7 cm from the nipple. 2.6 x 2.3 x 1.8 cm in maximum dimensions.    07/01/2014 Initial Biopsy   Left breast core needle bx: Invasive mammary carcinoma with lobular features, ER+ (100%), PR+ (91%), HER2/neu negative (ratio 1.09), Ki-67 32%. E-cadherin strongly positive, diagnostic for IDC     07/01/2014 Clinical Stage   Stage IIA: T2 N0    08/20/2014 Definitive Surgery   Left breast seed localized lumpectomy/SLNB: IDC, +LVI, DCIS, 1 LN  removed and positive for metastatic carcinoma. Grade 2. HER2/neu repeated and negative (ratio 0.69-1.9).     08/20/2014 Pathologic Stage   Stage IIB: pT2 pN1a pMx    09/17/2014 Surgery   Port-a cath placement and re-excision    10/02/2014 - 01/15/2015 Chemotherapy   CMF x 6 cycles.  patient refused Taxane-containing chemotherapy.    03/07/2015 Procedure   Comp Cancer Panel reveals no variant at APC, ATM, AXIN2, BARD1, BMPR1A, BRCA1, BRCA2, BRIP1, CDH1, CDK4, CDKN2A, CHEK2, FANCC, MLH1, MSH2, MSH6, MUTYH, NBN, PALB2, PMS2, POLD1, POLE, PTEN, RAD51C, RAD51D, SMAD4, STK11, TP53, VHL, and XRCC2.     04/06/2015 - 05/21/2015 Radiation Therapy   Adjuvant RT Regina Mcmahon): Left breast/ 45 Gy over 25 fractions.   Left supraclavicular fossa and axilla/ 45 Gy over 25 fractions. Left breast boost/ 16 Gy over 8 fractions. Total dose: 61 Gy    06/04/2015 - 08/07/2015 Anti-estrogen oral therapy   Exemestane 25 mg daily.  Planned duration of therapy 5 years.     07/23/2015 Survivorship   Survivorship care plan completed and mailed to patient in lieu of in person visit    08/06/2015 Adverse Reaction   Severe hot flashes and mood swings    08/08/2015 - 12/07/2015 Anti-estrogen oral therapy   Arimidex    12/04/2015 Adverse Reaction   Worsening depression, patient stopped Arimidex    12/07/2015 -  Anti-estrogen oral therapy   Tamoxifen    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Regina Mcmahon)  07/30/2021 Initial Diagnosis   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Regina Mcmahon) P 16 positive cT1N1   08/16/2021 - 09/13/2021 Radiation Therapy   Completed definitive radiation   09/30/2021 Pathology Results   [facility] Pathology PDL 1 29%   Squamous cell lung cancer (  Regina Mcmahon)  07/23/2021 Imaging    PET/CT  demonstrated small right glottic lesion is mildly hypermetabolic and consistent with no neoplasm.  PET also demonstrated borderline right level 2 lymph node left supraclavicular, mediastinal and hilar lymphadenopathy and  hypermetabolic left lower lobe pulmonary nodule all suspicious of metastatic focus.  No abdominal pelvic metastatic disease or osseous metastatic disease was appreciated.   08/31/2021 Initial Diagnosis   Squamous cell carcinoma of lung (Regina Mcmahon)    09/08/2021 Cancer Staging   Staging form: Lung, AJCC 8th Edition - Clinical stage from 09/08/2021: Stage IIIB (cT1b, cN3, cM0) - Signed by Regina Peak, Mcmahon on 09/08/2021 Stage prefix: Initial diagnosis    09/30/2021 Pathology Results   [facility] Pathology PDL 1 29%   11/01/2021 -  Chemotherapy   Patient is on Treatment Plan : LUNG Carboplatin / Paclitaxel + XRT q7d         INTERIM HISTORY: Shelia Mcmahon 71 y.o. female returns for a follow up prior to C3D1 of weekly carbo/taxol given concurrent with radiation for squamous  cell carcinoma of the lung. She is accompanied by her sister today.  She has been tolerating chemotherapy very well overall she says.  She has some diarrhea on the day of chemotherapy.  She has suffered mild nausea without vomiting and she takes Zofran as needed for nausea.  No neuropathy.  No fevers or chills.  She is just hoping that her voice comes back soon.  She otherwise denies any changes in breathing or urinary habits.  Rest of the pertinent 10 point ROS reviewed and negative.  MEDICAL HISTORY:  Past Medical History:  Diagnosis Date   Arthritis    Asthma    Asymptomatic varicose veins    Breast cancer (Regina Mcmahon) 06/2014   left   Cancer (Regina Mcmahon)    Supra Glottis   Chronic airway obstruction (Regina Mcmahon)    Complication of anesthesia 2016   difficulty waking up, Oxygen dropped low.   COPD (chronic obstructive pulmonary disease) (Regina Mcmahon)    Depression    Dyspnea    GERD (gastroesophageal reflux disease)    Hemorrhage rectum    and anus.   Hypertension    Insomnia    Other symptoms involving digestive system(787.99)    Pain    Hx.pain in joint involving pelvic region and thigh; pain in limb   Palpitations    Panic attacks     Personal history of chemotherapy 2016   Personal history of radiation therapy 2016   Pneumonia    Pre-diabetes    Prediabetes    Sinusitis    Symptomatic menopausal or female climacteric states    Varicose veins of both lower extremities with pain     SURGICAL HISTORY: Past Surgical History:  Procedure Laterality Date   BREAST BIOPSY Left 10/2012   BREAST BIOPSY Left 07/01/2014   malignant   BREAST LUMPECTOMY Left 08/20/2014   BRONCHIAL BIOPSY  08/23/2021   Procedure: BRONCHIAL BIOPSIES;  Surgeon: Leslye Peer, Mcmahon;  Location: [facility];  Service: Pulmonary;;   BRONCHIAL BRUSHINGS  08/23/2021   Procedure: BRONCHIAL BRUSHINGS;  Surgeon: Leslye Peer, Mcmahon;  [facility];  Service: Pulmonary;;   BRONCHIAL NEEDLE ASPIRATION BIOPSY  08/23/2021   Procedure: BRONCHIAL NEEDLE ASPIRATION BIOPSIES;  Surgeon: Leslye Peer, Mcmahon;  Location: [facility];  Service: Pulmonary;;   BRONCHIAL WASHINGS  08/23/2021   Procedure: BRONCHIAL WASHINGS;  Surgeon: Leslye Peer, Mcmahon;  Location: [facility];  Service: Pulmonary;;   CESAREAN SECTION  CHOLECYSTECTOMY  1985   COLONOSCOPY  2002   Dr. Cleotis Nipper: internal hemorrhoids, one small rectal polyp, adenomatous path    COLONOSCOPY N/A 11/23/2015   Procedure: COLONOSCOPY;  Surgeon: West Bali, Mcmahon;  Location: [facility];  Service: Endoscopy;  Laterality: N/A;  1100 - moved to 10:45 - office to notify   ESOPHAGOGASTRODUODENOSCOPY N/A 11/23/2015   Procedure: ESOPHAGOGASTRODUODENOSCOPY (EGD);  Surgeon: West Bali, Mcmahon;  Location: [facility];  Service: Endoscopy;  Laterality: N/A;   IR IMAGING GUIDED PORT INSERTION  11/08/2021   PORT-A-CATH REMOVAL  02/2015   PORTACATH PLACEMENT Right 09/17/2014   Procedure: INSERTION PORT-A-CATH WITH ULTRASOUND;  Surgeon: Harriette Bouillon, Mcmahon;  Location: [facility];  Service: General;  Laterality: Right;  IJ   RADIOACTIVE SEED GUIDED PARTIAL MASTECTOMY WITH AXILLARY SENTINEL LYMPH  NODE BIOPSY Left 08/20/2014   Procedure: LEFT BREAST LUMPECTOMY WITH RADIOACTIVE SEED LOCALIZATION AND SENTINEL LYMPH NODE MAPPING;  Surgeon: Harriette Bouillon, Mcmahon;  Location: [facility];  Service: General;  Laterality: Left;   RE-EXCISION OF BREAST LUMPECTOMY Left 09/17/2014   Procedure: RE-EXCISION OF BREAST LUMPECTOMY;  Surgeon: Harriette Bouillon, Mcmahon;  Location: [facility];  Service: General;  Laterality: Left;   TOTAL HIP ARTHROPLASTY Right 02/22/2021   Procedure: RIGHT TOTAL HIP ARTHROPLASTY ANTERIOR APPROACH;  Surgeon: Eldred Manges, Mcmahon;  Location: [facility];  Service: Orthopedics;  Laterality: Right;   TUBAL LIGATION     VIDEO BRONCHOSCOPY WITH ENDOBRONCHIAL ULTRASOUND N/A 08/23/2021   Procedure: VIDEO BRONCHOSCOPY WITH ENDOBRONCHIAL ULTRASOUND;  Surgeon: Leslye Peer, Mcmahon;  Location: [facility];  Service: Pulmonary;  Laterality: N/A;   VIDEO BRONCHOSCOPY WITH RADIAL ENDOBRONCHIAL ULTRASOUND  08/23/2021   Procedure: VIDEO BRONCHOSCOPY WITH RADIAL ENDOBRONCHIAL ULTRASOUND;  Surgeon: Leslye Peer, Mcmahon;  Location: [facility];  Service: Pulmonary;;    SOCIAL HISTORY: Social History   Socioeconomic History   Marital status: Divorced    Spouse name: Not on file   Number of children: 2   Years of education: Not on file   Highest education level: Not on file  Occupational History   Not on file  Tobacco Use   Smoking status: Former    Packs/day: 1.00    Years: 42.00    Pack years: 42.00    Types: Cigarettes    Quit date: 02/22/2021    Years since quitting: 0.7   Smokeless tobacco: Never  Vaping Use   Vaping Use: Never used  Substance and Sexual Activity   Alcohol use: No    Alcohol/week: 0.0 standard drinks   Drug use: No   Sexual activity: Never    Birth control/protection: None  Other Topics Concern   Not on file  Social History Narrative   Not on file   Social Determinants of Health   Financial Resource Strain: Not on file  Food Insecurity: Not on  file  Transportation Needs: Not on file  Physical Activity: Not on file  Stress: Not on file  Social Connections: Unknown   Frequency of Communication with Friends and Family: More than three times a week   Frequency of Social Gatherings with Friends and Family: More than three times a week   Attends Religious Services: Not on Scientist, clinical (histocompatibility and immunogenetics) or Organizations: Not on file   Attends Banker Meetings: Not on file   Marital Status: Not on file  Intimate Partner Violence: Not on file    FAMILY HISTORY: Family History  Problem  Relation Age of Onset   Diabetes Mother    Ovarian cancer Mother 62   Other Father        died in MVA at age 63   Other Sister        mitral valve disorder   Melanoma Sister 2       removed from back of leg   Colon cancer Brother        early 5s, succumbed to disease   Cancer Paternal Aunt        leukemia, bone, or lung cancer   Alzheimer's disease Maternal Grandmother    Heart attack Maternal Grandfather    Heart attack Paternal Grandmother    COPD Paternal Grandfather    Emphysema Paternal Grandfather    Congestive Heart Failure Paternal Aunt    Stomach cancer Paternal Uncle    Kidney cancer Maternal Uncle    Cancer Cousin        dx. teens/dx. 40s   Cancer Cousin    Colon cancer Cousin     ALLERGIES:  has No Known Allergies.  MEDICATIONS:  Current Outpatient Medications  Medication Sig Dispense Refill   albuterol (PROVENTIL HFA;VENTOLIN HFA) 108 (90 BASE) MCG/ACT inhaler Inhale 2 puffs into the lungs every 6 (six) hours as needed for wheezing or shortness of breath.      ALPRAZolam (XANAX) 0.5 MG tablet Take 0.5 mg by mouth 3 (three) times daily.     ANORO ELLIPTA 62.5-25 MCG/INH AEPB Inhale 1 puff into the lungs daily.     aspirin EC 325 MG EC tablet Take 1 tablet (325 mg total) by mouth daily with breakfast. Must take at least 4 weeks postop for DVT prophylaxis 30 tablet 0   calcium carbonate (TUMS - DOSED IN MG  ELEMENTAL CALCIUM) 500 MG chewable tablet Chew 2 tablets by mouth 3 (three) times daily as needed for indigestion or heartburn.     fluconazole (DIFLUCAN) 100 MG tablet Take 2 tablets today, then 1 tablet daily x 20 more days. 22 tablet 0   furosemide (LASIX) 20 MG tablet Take 20 mg by mouth daily.     hydrochlorothiazide (HYDRODIURIL) 12.5 MG tablet Take 12.5 mg by mouth daily.     HYDROcodone-acetaminophen (NORCO) 5-325 MG tablet Take 1 tablet by mouth every 6 (six) hours as needed for moderate pain. 30 tablet 0   ibuprofen (ADVIL) 800 MG tablet Take 800 mg by mouth every 8 (eight) hours as needed for moderate pain.     lidocaine (XYLOCAINE) 2 % solution Patient: Mix 1part 2% viscous lidocaine, 1part H20. Swallow 20mL of diluted mixture, before meals and at bedtime, up to QID for sore throat. 200 mL 3   lidocaine-prilocaine (EMLA) cream Apply 1 application. topically as needed. 30 g 0   Menthol, Topical Analgesic, (BIOFREEZE EX) Apply 1 application topically daily as needed (pain).     nystatin (MYCOSTATIN) 100000 UNIT/ML suspension Take 5 mLs (500,000 Units total) by mouth 4 (four) times daily. 60 mL 0   ondansetron (ZOFRAN) 8 MG tablet Take 1 tablet (8 mg total) by mouth every 8 (eight) hours as needed for nausea or vomiting. 60 tablet 0   pantoprazole (PROTONIX) 40 MG tablet Take 1 tablet by mouth once daily 90 tablet 0   potassium chloride (KLOR-CON M) 10 MEQ tablet Take 10 mEq by mouth daily. Patient stated stated that she was started on 20 meq daily in August.     potassium chloride (MICRO-K) 10 MEQ CR capsule Take 2  capsules (20 mEq total) by mouth 2 (two) times daily. 120 capsule 2   prochlorperazine (COMPAZINE) 10 MG tablet Take 1 tablet (10 mg total) by mouth every 6 (six) hours as needed for nausea or vomiting. 60 tablet 0   sertraline (ZOLOFT) 100 MG tablet Take 100 mg by mouth daily.     sucralfate (CARAFATE) 1 g tablet Dissolve 1 tablet in 10 mL H20 and swallow 30 min prior to  meals and bedtime. 40 tablet 3   No current facility-administered medications for this visit.      PHYSICAL EXAMINATION: ECOG PERFORMANCE STATUS: 1 - Symptomatic but completely ambulatory  Vitals:   11/16/21 1151  BP: 139/62  Pulse: 70  Resp: 18  Temp: 97.9 F (36.6 C)  SpO2: 93%   Filed Weights   11/16/21 1151  Weight: 209 lb 4.8 oz (94.9 kg)   Physical Exam Constitutional:      Appearance: Normal appearance.  Cardiovascular:     Rate and Rhythm: Normal rate and regular rhythm.     Pulses: Normal pulses.     Heart sounds: Normal heart sounds.  Pulmonary:     Effort: Pulmonary effort is normal.     Breath sounds: Normal breath sounds.  Abdominal:     General: Abdomen is flat.     Palpations: Abdomen is soft.  Musculoskeletal:     Cervical back: Normal range of motion and neck supple. No rigidity.  Lymphadenopathy:     Cervical: No cervical adenopathy.  Skin:    General: Skin is warm and dry.  Neurological:     General: No focal deficit present.     Mental Status: She is alert.     LABORATORY DATA:  I have reviewed the data as listed    Latest Ref Rng & Units 11/15/2021    2:29 PM 11/08/2021    2:41 PM 11/01/2021    8:07 AM  CBC  WBC 4.0 - 10.5 K/uL 3.0   4.9   6.2    Hemoglobin 12.0 - 15.0 g/dL 78.6   76.7   20.9    Hematocrit 36.0 - 46.0 % 37.2   36.3   39.5    Platelets 150 - 400 K/uL 174   194   223         Latest Ref Rng & Units 11/15/2021    2:29 PM 11/09/2021   12:39 PM 11/08/2021    2:41 PM  CMP  Glucose 70 - 99 mg/dL 470   962   836    BUN 8 - 23 mg/dL 18   11   13     Creatinine 0.44 - 1.00 mg/dL 6.29   4.76   5.46    Sodium 135 - 145 mmol/L 141   141   139    Potassium 3.5 - 5.1 mmol/L 4.9   2.9   2.7    Chloride 98 - 111 mmol/L 103   100   97    CO2 22 - 32 mmol/L 34   35   37    Calcium 8.9 - 10.3 mg/dL 9.7   9.3   9.4    Total Protein 6.5 - 8.1 g/dL 7.1   6.9   7.0    Total Bilirubin 0.3 - 1.2 mg/dL 1.5   1.2   1.2    Alkaline Phos  38 - 126 U/L 77   68   72    AST 15 - 41 U/L 10   9  10    ALT 0 - 44 U/L 6   7   7      RADIOGRAPHIC STUDIES: I have personally reviewed the radiological images as listed and agreed with the findings in the report. CT Soft Tissue Neck W Contrast  Result Date: 10/20/2021 CLINICAL DATA:  Head neck cancer monitoring EXAM: CT NECK WITH CONTRAST TECHNIQUE: Multidetector CT imaging of the neck was performed using the standard protocol following the bolus administration of intravenous contrast. RADIATION DOSE REDUCTION: This exam was performed according to the departmental dose-optimization program which includes automated exposure control, adjustment of the mA and/or kV according to patient size and/or use of iterative reconstruction technique. CONTRAST:  80mL OMNIPAQUE IOHEXOL 300 MG/ML  SOLN COMPARISON:  PET CT 07/23/2021.  Neck CT 06/23/2021 FINDINGS: Pharynx and larynx: Exophytic lesion seen on prior at the right glottis is no longer identified. There is a nodular area of accentuated enhancement at the anterior supraglottic larynx, 9 x 5 mm. No submucosal nodularity. Salivary glands: Unremarkable Thyroid: 3.5 cm right thyroid nodule, unchanged in without invasive features. Patient is undergoing active surveillance. Lymph nodes: Both jugulodigastric nodes measure larger but retain normal morphology with fatty hila and are at the upper range of normal length (15 mm). Suspect this is reactive enlargement given the symmetry, only the right side was borderline warm on prior PET. No heterogeneity of nodes in the neck Vascular: Carotid atheromatous calcifications. Limited intracranial: Negative Visualized orbits: Not covered Mastoids and visualized paranasal sinuses: Clear Skeleton: No acute or aggressive finding. Upper chest: Reported separately.  Upper mediastinal adenopathy. IMPRESSION: 1. Resolved polypoid lesion of the larynx. Mucosal prominence along the midline supraglottic larynx is likely stable and non  tumoral, this should be apparent on mucosal exam. 2. No worrisome nodes in the neck. 3. Mediastinal adenopathy, reference dedicated chest CT. Electronically Signed   By: Tiburcio Pea M.D.   On: 10/20/2021 07:33   CT Chest W Contrast  Result Date: 10/20/2021 CLINICAL DATA:  71 year old female with history of head neck cancer. Follow-up study. EXAM: CT CHEST WITH CONTRAST TECHNIQUE: Multidetector CT imaging of the chest was performed during intravenous contrast administration. RADIATION DOSE REDUCTION: This exam was performed according to the departmental dose-optimization program which includes automated exposure control, adjustment of the mA and/or kV according to patient size and/or use of iterative reconstruction technique. CONTRAST:  80mL OMNIPAQUE IOHEXOL 300 MG/ML  SOLN COMPARISON:  Chest CT 07/23/2021.  PET-CT 07/23/2021. FINDINGS: Cardiovascular: Heart size is normal. There is no significant pericardial fluid, thickening or pericardial calcification. There is aortic atherosclerosis, as well as atherosclerosis of the great vessels of the mediastinum and the coronary arteries, including calcified atherosclerotic plaque in the left main, left anterior descending and right coronary arteries. Calcifications of the aortic valve and mitral annulus Mediastinum/Nodes: There are multiple enlarged mediastinal lymph nodes, including a nodal conglomerate in the subcarinal nodal station measuring 2.1 x 3.4 cm (axial image 24 of series 3) and multiple other enlarged lymph nodes measuring up to 2.0 cm in the right paratracheal nodal stations (axial images 15 and 16 of series 3). Esophagus is unremarkable in appearance. No axillary lymphadenopathy. Asymmetric mass-like enlargement and heterogeneity in the right lobe of the thyroid gland which measures 4.1 x 3.2 cm. Lungs/Pleura: Today's study is limited by considerable patient respiratory motion. With these limitations in mind, the previously noted left lower lobe  pulmonary nodule (axial image 85 of series 6) currently measures 1.1 x 1.0 cm (previously 1.1 x 0.7 cm). A few  other scattered smaller pulmonary nodules are also noted elsewhere in the lungs, similar to the prior study but partially obscured by motion. No confluent consolidative airspace disease. No pleural effusions. Diffuse bronchial wall thickening with mild centrilobular and paraseptal emphysema. Upper Abdomen: Status post cholecystectomy. Musculoskeletal: There are no aggressive appearing lytic or blastic lesions noted in the visualized portions of the skeleton. IMPRESSION: 1. Slight interval enlargement of left lower lobe pulmonary nodule which currently measures 1.1 x 1.0 cm. 2. Extensive mediastinal lymphadenopathy appears very similar to the prior examination, as detailed above. 3. Asymmetrically enlarged right lobe of thyroid gland, similar to the prior study, previously evaluated by thyroid ultrasound. Correlation with results of fine-needle aspiration is recommended. 4. Aortic atherosclerosis, in addition to left main and 2 vessel coronary artery disease. Please note that although the presence of coronary artery calcium documents the presence of coronary artery disease, the severity of this disease and any potential stenosis cannot be assessed on this non-gated CT examination. Assessment for potential risk factor modification, dietary therapy or pharmacologic therapy may be warranted, if clinically indicated. 5. There are calcifications of the aortic valve and mitral annulus. Echocardiographic correlation for evaluation of potential valvular dysfunction may be warranted if clinically indicated. 6. Mild diffuse bronchial wall thickening with mild centrilobular and paraseptal emphysema; imaging findings suggestive of underlying COPD Aortic Atherosclerosis (ICD10-I70.0) and Emphysema (ICD10-J43.9). Electronically Signed   By: Trudie Reed M.D.   On: 10/20/2021 07:36   IR IMAGING GUIDED PORT  INSERTION  Result Date: 11/08/2021 CLINICAL DATA:  Lung carcinoma, history of bilateral axillary lymph node dissection, needs durable venous access for treatment regimen EXAM: TUNNELED PORT CATHETER PLACEMENT WITH ULTRASOUND AND FLUOROSCOPIC GUIDANCE FLUOROSCOPY: Radiation Exposure Index (as provided by the fluoroscopic device): 44 mGy air Kerma ANESTHESIA/SEDATION: Intravenous Fentanyl and Versed  were administered as conscious sedation during continuous monitoring of the patient's level of consciousness and physiological / cardiorespiratory status by the radiology RN, with a total moderate sedation time of 28 minutes. TECHNIQUE: The procedure, risks, benefits, and alternatives were explained to the patient. Questions regarding the procedure were encouraged and answered. The patient understands and consents to the procedure. Patency of the right IJ vein was confirmed with ultrasound with image documentation. An appropriate skin site was determined. Skin site was marked. Region was prepped using maximum barrier technique including cap and mask, sterile gown, sterile gloves, large sterile sheet, and Chlorhexidine as cutaneous antisepsis. The region was infiltrated locally with 1% lidocaine. Under real-time ultrasound guidance, the right IJ vein was accessed with a 21 gauge micropuncture needle; the needle tip within the vein was confirmed with ultrasound image documentation. The guidewire would not advance centrally. 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 The transitional dilator set was utilized through which an angled stiff glidewire was advanced through the stenotic right IJ vein and advanced into the IVC. A 5 French angiographic catheter was placed. Vascular measurement was performed. A small incision was made on the right anterior chest wall and a subcutaneous pocket fashioned. The power-injectable port was positioned and its  catheter tunneled to the right IJ dermatotomy site. The angiographic catheter was exchanged over an Amplatz wire for a peel-away sheath, through which the port catheter, which had been trimmed to the appropriate length, was advanced and positioned under fluoroscopy with its tip at the cavoatrial junction. Spot chest radiograph confirms good catheter position and no pneumothorax. The port was flushed per protocol. The pocket was  closed with deep interrupted and subcuticular continuous 3-0 Monocryl sutures. The incisions were covered with Dermabond then covered with a sterile dressing. The patient tolerated the procedure well. COMPLICATIONS: COMPLICATIONS None immediate IMPRESSION: Technically successful right IJ power-injectable port catheter placement. Ready for routine use. Electronically Signed   By: Corlis Leak M.D.   On: 11/08/2021 14:50    ASSESSMENT & PLAN Regina Mcmahon is a 71 y.o. female who returns for a follow up.  Malignant neoplasm of supraglottis --Thyroid US on 06/03/21 revealed a suspicious nodule located in the mid right thyroid lobe, measuring 5.0 cm in the greatest extent --Underwent laryngoscopy on 06/07/2021 which revealed a granular mass of the right glottis suggestive of glottic cancer. CT neck from 06/23/2021 showed 9 mm nodule right vocal cord. No enlarged lymph nodes were appreciated in the neck. An enlarged right paratracheal lymph node was appreciated as well, noted as suspicious for metastatic disease, as well as the right thyroid nodule measuring 39 x 34 mm.  --Proceeded with biopsy of the right vocal cord mass on 06/25/21 which revealed squamous cell carcinoma; p16 positive. --PET on 07/23/21 demonstrated the small right glottic lesion as mildly hypermetabolic and consistent with known neoplasm. PET also demonstrated: a borderline right level 2 lymph node; left supraclavicular, mediastinal and hilar lymphadenopathy; and a hypermetabolic left lower lobe pulmonary nodule, noted  as likely a metastatic focus. No other findings for abdominal/pelvic metastatic disease or osseous metastatic disease were appreciated.  --Received definitive radiation from 08/16/2021 through 09/13/2021. 54 Gy in 20 Fx.  --Post treatment CT neck from 10/18/2021 showed resolved polypoid lesion of the larynx. No worrisome nodes in the neck. She will continue surveillance with ENT, she has a follow-up with Dr. Jenne Pane next week.  2. 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 --Underwent EBUS with biopsy of left lower lobe pulmonary nodule, Level 4R and 7 lymph nodes. Pathology confirmed squamous cell carcinoma of left lower lobe nodule along with level 4R and 7 level nodules --Started concurrent chemoradiation with weekly carbo/taxol on 11/01/2021. -- PD-L1 29% hence she is not a candidate for single agent immunotherapy.  We will consider adjuvant immunotherapy after completion of concurrent chemoradiation for 1 year. --She is tolerating concurrent chemoradiation very well.  Okay to proceed with the chemotherapy as planned today.  We have also discussed briefly about the nationwide shortage of carboplatin today.  3. 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 --She is due for mammogram, will plan to schedule once lung cancer therapy is complete  4. Hypokalemia: --Secondary to diarrhea and lasix.   --Potassium level is 4.9, will decrease one 20 meq daily.  5. Nausea: --Sent prescription for Zofran and Compazine. Advised to avoid taking Compazine and Xanax simultaneously to avoid sedation.  She prefers Zofran because this does not make her sleepy.  6.  Diffuse body aches: -Currently takes 800 of Aleve every day for pain control. -She very rarely uses norco  7. Oral thrush: --Sent Nystatin mouthwash.  8. Diarrhea: --Secondary to chemotherapy --Advised to take imodium as needed and stay hydrated - Currently grade 1   No orders of the defined types were placed in this encounter.   All questions were answered. The patient knows to call the clinic with any problems, questions or concerns.  I have spent a total of 30 minutes minutes of face-to-face and non-face-to-face time, preparing to see the patient,  performing a medically appropriate examination, counseling and educating the patient, ordering medications/tests, documenting clinical information in the electronic health record,  and care coordination.

## 2021-11-16 NOTE — Patient Instructions (Signed)
[facility]  Discharge Instructions: Thank you for choosing [facility] to provide your oncology and hematology care.   If you have a lab appointment with the Cancer Center, please go directly to the Cancer Center and check in at the registration area.   Wear comfortable clothing and clothing appropriate for easy access to any Portacath or PICC line.   We strive to give you quality time with your provider. You may need to reschedule your appointment if you arrive late (15 or more minutes).  Arriving late affects you and other patients whose appointments are after yours.  Also, if you miss three or more appointments without notifying the office, you may be dismissed from the clinic at the provider's discretion.      For prescription refill requests, have your pharmacy contact our office and allow 72 hours for refills to be completed.    Today you received the following chemotherapy and/or immunotherapy agents: Taxol & Carboplatin   To help prevent nausea and vomiting after your treatment, we encourage you to take your nausea medication as directed.  BELOW ARE SYMPTOMS THAT SHOULD BE REPORTED IMMEDIATELY: *FEVER GREATER THAN 100.4 F (38 C) OR HIGHER *CHILLS OR SWEATING *NAUSEA AND VOMITING THAT IS NOT CONTROLLED WITH YOUR NAUSEA MEDICATION *UNUSUAL SHORTNESS OF BREATH *UNUSUAL BRUISING OR BLEEDING *URINARY PROBLEMS (pain or burning when urinating, or frequent urination) *BOWEL PROBLEMS (unusual diarrhea, constipation, pain near the anus) TENDERNESS IN MOUTH AND THROAT WITH OR WITHOUT PRESENCE OF ULCERS (sore throat, sores in mouth, or a toothache) UNUSUAL RASH, SWELLING OR PAIN  UNUSUAL VAGINAL DISCHARGE OR ITCHING   Items with * indicate a potential emergency and should be followed up as soon as possible or go to the Emergency Department if any problems should occur.  Please show the CHEMOTHERAPY ALERT CARD or IMMUNOTHERAPY ALERT CARD at  check-in to the Emergency Department and triage nurse.  Should you have questions after your visit or need to cancel or reschedule your appointment, please contact [facility]  Dept: [phone]  and follow the prompts.  Office hours are 8:00 a.m. to 4:30 p.m. Monday - Friday. Please note that voicemails left after 4:00 p.m. may not be returned until the following business day.  We are closed weekends and major holidays. You have access to a nurse at all times for urgent questions. Please call the main number to the clinic Dept: [phone] and follow the prompts.   For any non-urgent questions, you may also contact your provider using MyChart. We now offer e-Visits for anyone 18 and older to request care online for non-urgent symptoms. For details visit [email or web address].   Also download the MyChart app! Go to the app store, search "MyChart", open the app, select [facility], and log in with your MyChart username and password.  Due to Covid, a mask is required upon entering the hospital/clinic. If you do not have a mask, one will be given to you upon arrival. For doctor visits, patients may have 1 support person aged 18 or older with them. For treatment visits, patients cannot have anyone with them due to current Covid guidelines and our immunocompromised population.   

## 2021-11-16 NOTE — Progress Notes (Signed)
Brief nutrition follow up completed with patient. She is being treated for lung cancer but has history of head and neck cancer with final radiation completed on February 20. ? ?Noted Glucose 103. ? ?Wt documented as 209 pounds 4.8 oz. This is decreased from 212 pounds on April 10. ? ?She stills struggles with her voice. She is very hoarse. ?Noted Diarrhea on days of chemotherapy. ?Reports mild nausea but no vomiting. ?She has painful swallow but is on Carafate. ?Her legs are jumpy from Benadryl. ?Her sister bought her Enterade and she really likes it. ? ?Nutrition Diagnosis: ?Unintended wt loss continues. ? ?Intervention: ?Educated patient to minimize wt loss by increasing oral nutrition supplements. Recommended Carnation breakfast essentials added to milk. ?OK to continue Enterade. Recommended dose is BID. Educated it is not a high calorie, high protein supplement. Reviewed directions for taking it per manufacturer web site. ?Take medications as needed for nausea. ? ?Monitoring, Evaluation, Goals: ?Patient will increase calories and protein to minimize wt loss. ? ?Next Visit: ?To be scheduled with treatment as needed. ? ?

## 2021-11-16 NOTE — Progress Notes (Signed)
?  ?DATE:  11/16/21 ? ? ?                                     ?X CHEMO/IMMUNOTHERAPY REACTION         ?  ?MD: Dr. Chryl Heck ?  ?AGENT/BLOOD PRODUCT RECEIVING TODAY:              taxol and carboplatin ?  ?AGENT/BLOOD PRODUCT RECEIVING IMMEDIATELY PRIOR TO REACTION:          taxol ?  ?VS: ?BP:     162/81   P:       91       SPO2:       95% on 4L                ?BP:     140/66   P:       86       SPO2:       94% RA   ?  ?REACTION(S):           shortness of breath ?  ?PREMEDS:     benadryl 50 mg, dexamethasone 10 mg IV, palonosetron 0.25 mg ?  ?INTERVENTION: IVF, solumedrol, albuterol ?  ?Review of Systems  ?Review of Systems  ?Respiratory:  Positive for shortness of breath.   ?All other systems reviewed and are negative.  ? ?Physical Exam  ?Physical Exam ?Vitals and nursing note reviewed.  ?Constitutional:   ?   Appearance: She is well-developed. She is not ill-appearing or toxic-appearing.  ?   Comments: Whispering voice which is baseline per patient and staff  ?HENT:  ?   Head: Normocephalic and atraumatic.  ?   Nose: Nose normal.  ?Eyes:  ?   General: No scleral icterus.    ?   Right eye: No discharge.     ?   Left eye: No discharge.  ?   Conjunctiva/sclera: Conjunctivae normal.  ?Neck:  ?   Vascular: No JVD.  ?Cardiovascular:  ?   Rate and Rhythm: Normal rate and regular rhythm.  ?   Pulses: Normal pulses.  ?   Heart sounds: Normal heart sounds.  ?Pulmonary:  ?   Effort: Pulmonary effort is normal.  ?   Breath sounds: Normal breath sounds.  ?   Comments: Hypoxic to 86% on room air with faint lung sounds throughout. ?Abdominal:  ?   General: There is no distension.  ?Musculoskeletal:     ?   General: Normal range of motion.  ?   Cervical back: Normal range of motion.  ?Skin: ?   General: Skin is warm and dry.  ?Neurological:  ?   Mental Status: She is oriented to person, place, and time.  ?   GCS: GCS eye subscore is 4. GCS verbal subscore is 5. GCS motor subscore is 6.  ?   Comments: Fluent speech, no facial droop.   ?Psychiatric:     ?   Behavior: Behavior normal.  ? ? ?OUTCOME:           Patient had shortness of breath after returning from the bathroom.  She states while in the bathroom she had diarrhea.  She had received most of the Taxol and this was her third treatment.  Symptoms are more suggestive of COPD exacerbation.  Patient improved after intervention and hypoxia resolved.  Patient tolerated carboplatin infusion without difficulty.  Oncologist aware  of patient's symptoms and agreeable with plan of care.   ? ?

## 2021-11-16 NOTE — Progress Notes (Signed)
At 1530 patient returned from the restroom and reported she could not breathe. Patient was patting her chest. O2 at 2 liters was placed on the patient. Taxol paused and a liter of fluids was started. Symptom management was called. Oxygen saturation dropped to 82% on room air and recovered to 86% on 2 liters. Further orders received to administer Albuterol and solumedrol per Anda Kraft, NP Symptom Management. Taxol was stopped and remaining infusion was discarded.  ? ? ?Patient recovered enough on Oxygen to complete radiation therapy. Upon return to infusion patient's o2 saturation was 100% on 2 liters. Patient was removed from oxygen and observed for 30 minutes. Oxygen saturation remained above 92% while sitting.  ? ?Patient completed Carboplatin and was discharged from infusion. VSS ?Patient ambulatory. ? ? ?

## 2021-11-17 ENCOUNTER — Ambulatory Visit
Admission: RE | Admit: 2021-11-17 | Discharge: 2021-11-17 | Disposition: A | Discharge status: Home / Self Care | Payer: Medicare HMO | Source: Ambulatory Visit | Attending: Radiation Oncology | Admitting: Radiation Oncology

## 2021-11-17 ENCOUNTER — Other Ambulatory Visit: Payer: Self-pay

## 2021-11-17 DIAGNOSIS — C3432 Malignant neoplasm of lower lobe, left bronchus or lung: Secondary | ICD-10-CM | POA: Diagnosis not present

## 2021-11-17 DIAGNOSIS — C320 Malignant neoplasm of glottis: Secondary | ICD-10-CM | POA: Diagnosis not present

## 2021-11-17 DIAGNOSIS — Z87891 Personal history of nicotine dependence: Secondary | ICD-10-CM | POA: Diagnosis not present

## 2021-11-17 DIAGNOSIS — Z510 Encounter for antineoplastic radiation therapy: Secondary | ICD-10-CM | POA: Diagnosis not present

## 2021-11-17 LAB — RAD ONC ARIA SESSION SUMMARY
Course Elapsed Days: 14
Plan Fractions Treated to Date: 11
Plan Prescribed Dose Per Fraction: 2 Gy
Plan Total Fractions Prescribed: 30
Plan Total Prescribed Dose: 60 Gy
Reference Point Dosage Given to Date: 22 Gy
Reference Point Session Dosage Given: 2 Gy
Session Number: 11

## 2021-11-18 ENCOUNTER — Other Ambulatory Visit: Payer: Self-pay

## 2021-11-18 ENCOUNTER — Ambulatory Visit
Admission: RE | Admit: 2021-11-18 | Discharge: 2021-11-18 | Disposition: A | Discharge status: Home / Self Care | Payer: Medicare HMO | Source: Ambulatory Visit | Attending: Radiation Oncology | Admitting: Radiation Oncology

## 2021-11-18 DIAGNOSIS — Z87891 Personal history of nicotine dependence: Secondary | ICD-10-CM | POA: Diagnosis not present

## 2021-11-18 DIAGNOSIS — Z510 Encounter for antineoplastic radiation therapy: Secondary | ICD-10-CM | POA: Diagnosis not present

## 2021-11-18 DIAGNOSIS — C3432 Malignant neoplasm of lower lobe, left bronchus or lung: Secondary | ICD-10-CM | POA: Diagnosis not present

## 2021-11-18 DIAGNOSIS — C320 Malignant neoplasm of glottis: Secondary | ICD-10-CM | POA: Diagnosis not present

## 2021-11-18 LAB — RAD ONC ARIA SESSION SUMMARY
Course Elapsed Days: 15
Plan Fractions Treated to Date: 12
Plan Prescribed Dose Per Fraction: 2 Gy
Plan Total Fractions Prescribed: 30
Plan Total Prescribed Dose: 60 Gy
Reference Point Dosage Given to Date: 24 Gy
Reference Point Session Dosage Given: 2 Gy
Session Number: 12

## 2021-11-19 ENCOUNTER — Ambulatory Visit
Admission: RE | Admit: 2021-11-19 | Discharge: 2021-11-19 | Disposition: A | Discharge status: Home / Self Care | Payer: Medicare HMO | Source: Ambulatory Visit | Attending: Radiation Oncology | Admitting: Radiation Oncology

## 2021-11-19 ENCOUNTER — Other Ambulatory Visit: Payer: Self-pay

## 2021-11-19 ENCOUNTER — Encounter: Payer: Self-pay | Admitting: Hematology and Oncology

## 2021-11-19 DIAGNOSIS — Z8521 Personal history of malignant neoplasm of larynx: Secondary | ICD-10-CM | POA: Diagnosis not present

## 2021-11-19 DIAGNOSIS — C3432 Malignant neoplasm of lower lobe, left bronchus or lung: Secondary | ICD-10-CM | POA: Diagnosis not present

## 2021-11-19 DIAGNOSIS — R490 Dysphonia: Secondary | ICD-10-CM | POA: Diagnosis not present

## 2021-11-19 DIAGNOSIS — C320 Malignant neoplasm of glottis: Secondary | ICD-10-CM | POA: Diagnosis not present

## 2021-11-19 DIAGNOSIS — Z87891 Personal history of nicotine dependence: Secondary | ICD-10-CM | POA: Diagnosis not present

## 2021-11-19 DIAGNOSIS — Q310 Web of larynx: Secondary | ICD-10-CM | POA: Diagnosis not present

## 2021-11-19 DIAGNOSIS — Z510 Encounter for antineoplastic radiation therapy: Secondary | ICD-10-CM | POA: Diagnosis not present

## 2021-11-19 LAB — RAD ONC ARIA SESSION SUMMARY
Course Elapsed Days: 16
Plan Fractions Treated to Date: 13
Plan Prescribed Dose Per Fraction: 2 Gy
Plan Total Fractions Prescribed: 30
Plan Total Prescribed Dose: 60 Gy
Reference Point Dosage Given to Date: 26 Gy
Reference Point Session Dosage Given: 2 Gy
Session Number: 13

## 2021-11-22 ENCOUNTER — Inpatient Hospital Stay: Payer: Medicare HMO | Attending: Hematology and Oncology

## 2021-11-22 ENCOUNTER — Other Ambulatory Visit: Payer: Self-pay

## 2021-11-22 ENCOUNTER — Inpatient Hospital Stay: Payer: Medicare HMO

## 2021-11-22 ENCOUNTER — Ambulatory Visit
Admission: RE | Admit: 2021-11-22 | Discharge: 2021-11-22 | Disposition: A | Discharge status: Home / Self Care | Payer: Medicare HMO | Source: Ambulatory Visit | Attending: Radiation Oncology | Admitting: Radiation Oncology

## 2021-11-22 ENCOUNTER — Inpatient Hospital Stay: Payer: Medicare HMO | Admitting: Hematology and Oncology

## 2021-11-22 DIAGNOSIS — Z806 Family history of leukemia: Secondary | ICD-10-CM | POA: Diagnosis not present

## 2021-11-22 DIAGNOSIS — D649 Anemia, unspecified: Secondary | ICD-10-CM | POA: Insufficient documentation

## 2021-11-22 DIAGNOSIS — Z79899 Other long term (current) drug therapy: Secondary | ICD-10-CM | POA: Insufficient documentation

## 2021-11-22 DIAGNOSIS — Z87891 Personal history of nicotine dependence: Secondary | ICD-10-CM | POA: Insufficient documentation

## 2021-11-22 DIAGNOSIS — Z818 Family history of other mental and behavioral disorders: Secondary | ICD-10-CM | POA: Insufficient documentation

## 2021-11-22 DIAGNOSIS — Z8249 Family history of ischemic heart disease and other diseases of the circulatory system: Secondary | ICD-10-CM | POA: Diagnosis not present

## 2021-11-22 DIAGNOSIS — C50412 Malignant neoplasm of upper-outer quadrant of left female breast: Secondary | ICD-10-CM | POA: Diagnosis not present

## 2021-11-22 DIAGNOSIS — Z833 Family history of diabetes mellitus: Secondary | ICD-10-CM | POA: Insufficient documentation

## 2021-11-22 DIAGNOSIS — Z8041 Family history of malignant neoplasm of ovary: Secondary | ICD-10-CM | POA: Diagnosis not present

## 2021-11-22 DIAGNOSIS — R197 Diarrhea, unspecified: Secondary | ICD-10-CM | POA: Insufficient documentation

## 2021-11-22 DIAGNOSIS — Z170 Estrogen receptor positive status [ER+]: Secondary | ICD-10-CM | POA: Diagnosis not present

## 2021-11-22 DIAGNOSIS — C320 Malignant neoplasm of glottis: Secondary | ICD-10-CM | POA: Insufficient documentation

## 2021-11-22 DIAGNOSIS — C321 Malignant neoplasm of supraglottis: Secondary | ICD-10-CM | POA: Insufficient documentation

## 2021-11-22 DIAGNOSIS — R5383 Other fatigue: Secondary | ICD-10-CM | POA: Insufficient documentation

## 2021-11-22 DIAGNOSIS — R110 Nausea: Secondary | ICD-10-CM | POA: Diagnosis not present

## 2021-11-22 DIAGNOSIS — Z5111 Encounter for antineoplastic chemotherapy: Secondary | ICD-10-CM | POA: Insufficient documentation

## 2021-11-22 DIAGNOSIS — R070 Pain in throat: Secondary | ICD-10-CM | POA: Diagnosis not present

## 2021-11-22 DIAGNOSIS — Z510 Encounter for antineoplastic radiation therapy: Secondary | ICD-10-CM | POA: Insufficient documentation

## 2021-11-22 DIAGNOSIS — B370 Candidal stomatitis: Secondary | ICD-10-CM | POA: Insufficient documentation

## 2021-11-22 DIAGNOSIS — Z923 Personal history of irradiation: Secondary | ICD-10-CM | POA: Diagnosis not present

## 2021-11-22 DIAGNOSIS — E876 Hypokalemia: Secondary | ICD-10-CM | POA: Insufficient documentation

## 2021-11-22 DIAGNOSIS — C3432 Malignant neoplasm of lower lobe, left bronchus or lung: Secondary | ICD-10-CM | POA: Insufficient documentation

## 2021-11-22 DIAGNOSIS — Z8051 Family history of malignant neoplasm of kidney: Secondary | ICD-10-CM | POA: Insufficient documentation

## 2021-11-22 DIAGNOSIS — Z800 Family history of malignant neoplasm of digestive organs: Secondary | ICD-10-CM | POA: Insufficient documentation

## 2021-11-22 DIAGNOSIS — Z9221 Personal history of antineoplastic chemotherapy: Secondary | ICD-10-CM | POA: Diagnosis not present

## 2021-11-22 DIAGNOSIS — R0602 Shortness of breath: Secondary | ICD-10-CM | POA: Diagnosis not present

## 2021-11-22 DIAGNOSIS — D696 Thrombocytopenia, unspecified: Secondary | ICD-10-CM | POA: Insufficient documentation

## 2021-11-22 DIAGNOSIS — Z836 Family history of other diseases of the respiratory system: Secondary | ICD-10-CM | POA: Insufficient documentation

## 2021-11-22 DIAGNOSIS — Z809 Family history of malignant neoplasm, unspecified: Secondary | ICD-10-CM | POA: Insufficient documentation

## 2021-11-22 LAB — COMPREHENSIVE METABOLIC PANEL
ALT: 8 U/L (ref 0–44)
AST: 10 U/L — ABNORMAL LOW (ref 15–41)
Albumin: 4.1 g/dL (ref 3.5–5.0)
Alkaline Phosphatase: 70 U/L (ref 38–126)
Anion gap: 5 (ref 5–15)
BUN: 17 mg/dL (ref 8–23)
CO2: 33 mmol/L — ABNORMAL HIGH (ref 22–32)
Calcium: 10.1 mg/dL (ref 8.9–10.3)
Chloride: 102 mmol/L (ref 98–111)
Creatinine, Ser: 0.82 mg/dL (ref 0.44–1.00)
GFR, Estimated: >60 mL/min (ref >60)
Glucose, Bld: 112 mg/dL — ABNORMAL HIGH (ref 70–99)
Potassium: 4.1 mmol/L (ref 3.5–5.1)
Sodium: 140 mmol/L (ref 135–145)
Total Bilirubin: 1.6 mg/dL — ABNORMAL HIGH (ref 0.3–1.2)
Total Protein: 7.1 g/dL (ref 6.5–8.1)

## 2021-11-22 LAB — RAD ONC ARIA SESSION SUMMARY
Course Elapsed Days: 19
Plan Fractions Treated to Date: 14
Plan Prescribed Dose Per Fraction: 2 Gy
Plan Total Fractions Prescribed: 30
Plan Total Prescribed Dose: 60 Gy
Reference Point Dosage Given to Date: 28 Gy
Reference Point Session Dosage Given: 2 Gy
Session Number: 14

## 2021-11-22 LAB — CBC WITH DIFFERENTIAL/PLATELET
Abs Immature Granulocytes: 0.01 10*3/uL (ref 0.00–0.07)
Basophils Absolute: 0 10*3/uL (ref 0.0–0.1)
Basophils Relative: 1 %
Eosinophils Absolute: 0 10*3/uL (ref 0.0–0.5)
Eosinophils Relative: 1 %
HCT: 36.2 % (ref 36.0–46.0)
Hemoglobin: 12.7 g/dL (ref 12.0–15.0)
Immature Granulocytes: 0 %
Lymphocytes Relative: 10 %
Lymphs Abs: 0.3 10*3/uL — ABNORMAL LOW (ref 0.7–4.0)
MCH: 31.2 pg (ref 26.0–34.0)
MCHC: 35.1 g/dL (ref 30.0–36.0)
MCV: 88.9 fL (ref 80.0–100.0)
Monocytes Absolute: 0.3 10*3/uL (ref 0.1–1.0)
Monocytes Relative: 10 %
Neutro Abs: 2.4 10*3/uL (ref 1.7–7.7)
Neutrophils Relative %: 78 %
Platelets: 132 10*3/uL — ABNORMAL LOW (ref 150–400)
RBC: 4.07 MIL/uL (ref 3.87–5.11)
RDW: 13.3 % (ref 11.5–15.5)
WBC: 3.1 10*3/uL — ABNORMAL LOW (ref 4.0–10.5)
nRBC: 0 % (ref 0.0–0.2)

## 2021-11-23 ENCOUNTER — Ambulatory Visit
Admission: RE | Admit: 2021-11-23 | Discharge: 2021-11-23 | Disposition: A | Discharge status: Home / Self Care | Payer: Medicare HMO | Source: Ambulatory Visit | Attending: Radiation Oncology | Admitting: Radiation Oncology

## 2021-11-23 ENCOUNTER — Other Ambulatory Visit: Payer: Self-pay

## 2021-11-23 ENCOUNTER — Inpatient Hospital Stay: Payer: Medicare HMO

## 2021-11-23 ENCOUNTER — Inpatient Hospital Stay: Payer: Medicare HMO | Admitting: Physician Assistant

## 2021-11-23 ENCOUNTER — Inpatient Hospital Stay: Payer: Medicare HMO | Admitting: Nurse Practitioner

## 2021-11-23 ENCOUNTER — Encounter: Payer: Self-pay | Admitting: Nurse Practitioner

## 2021-11-23 VITALS — BP 127/76 | HR 83 | Temp 98.1°F | Resp 16 | Wt 204.7 lb

## 2021-11-23 VITALS — BP 133/77 | HR 82 | Temp 98.2°F | Resp 16

## 2021-11-23 DIAGNOSIS — R110 Nausea: Secondary | ICD-10-CM | POA: Diagnosis not present

## 2021-11-23 DIAGNOSIS — C50412 Malignant neoplasm of upper-outer quadrant of left female breast: Secondary | ICD-10-CM | POA: Diagnosis not present

## 2021-11-23 DIAGNOSIS — D649 Anemia, unspecified: Secondary | ICD-10-CM | POA: Diagnosis not present

## 2021-11-23 DIAGNOSIS — C3490 Malignant neoplasm of unspecified part of unspecified bronchus or lung: Secondary | ICD-10-CM | POA: Diagnosis not present

## 2021-11-23 DIAGNOSIS — Z806 Family history of leukemia: Secondary | ICD-10-CM | POA: Diagnosis not present

## 2021-11-23 DIAGNOSIS — Z923 Personal history of irradiation: Secondary | ICD-10-CM | POA: Diagnosis not present

## 2021-11-23 DIAGNOSIS — R197 Diarrhea, unspecified: Secondary | ICD-10-CM | POA: Diagnosis not present

## 2021-11-23 DIAGNOSIS — Z9221 Personal history of antineoplastic chemotherapy: Secondary | ICD-10-CM | POA: Diagnosis not present

## 2021-11-23 DIAGNOSIS — R0602 Shortness of breath: Secondary | ICD-10-CM | POA: Diagnosis not present

## 2021-11-23 DIAGNOSIS — B370 Candidal stomatitis: Secondary | ICD-10-CM | POA: Diagnosis not present

## 2021-11-23 DIAGNOSIS — Z5111 Encounter for antineoplastic chemotherapy: Secondary | ICD-10-CM | POA: Diagnosis not present

## 2021-11-23 DIAGNOSIS — Z87891 Personal history of nicotine dependence: Secondary | ICD-10-CM | POA: Diagnosis not present

## 2021-11-23 DIAGNOSIS — Z79899 Other long term (current) drug therapy: Secondary | ICD-10-CM | POA: Diagnosis not present

## 2021-11-23 DIAGNOSIS — C320 Malignant neoplasm of glottis: Secondary | ICD-10-CM | POA: Diagnosis not present

## 2021-11-23 DIAGNOSIS — C321 Malignant neoplasm of supraglottis: Secondary | ICD-10-CM | POA: Diagnosis not present

## 2021-11-23 DIAGNOSIS — Z510 Encounter for antineoplastic radiation therapy: Secondary | ICD-10-CM | POA: Diagnosis not present

## 2021-11-23 DIAGNOSIS — Z833 Family history of diabetes mellitus: Secondary | ICD-10-CM | POA: Diagnosis not present

## 2021-11-23 DIAGNOSIS — Z800 Family history of malignant neoplasm of digestive organs: Secondary | ICD-10-CM | POA: Diagnosis not present

## 2021-11-23 DIAGNOSIS — R5383 Other fatigue: Secondary | ICD-10-CM | POA: Diagnosis not present

## 2021-11-23 DIAGNOSIS — D696 Thrombocytopenia, unspecified: Secondary | ICD-10-CM | POA: Diagnosis not present

## 2021-11-23 DIAGNOSIS — Z818 Family history of other mental and behavioral disorders: Secondary | ICD-10-CM | POA: Diagnosis not present

## 2021-11-23 DIAGNOSIS — Z8041 Family history of malignant neoplasm of ovary: Secondary | ICD-10-CM | POA: Diagnosis not present

## 2021-11-23 DIAGNOSIS — E876 Hypokalemia: Secondary | ICD-10-CM | POA: Diagnosis not present

## 2021-11-23 DIAGNOSIS — Z170 Estrogen receptor positive status [ER+]: Secondary | ICD-10-CM | POA: Diagnosis not present

## 2021-11-23 DIAGNOSIS — C3432 Malignant neoplasm of lower lobe, left bronchus or lung: Secondary | ICD-10-CM | POA: Diagnosis not present

## 2021-11-23 DIAGNOSIS — R070 Pain in throat: Secondary | ICD-10-CM | POA: Diagnosis not present

## 2021-11-23 DIAGNOSIS — Z8249 Family history of ischemic heart disease and other diseases of the circulatory system: Secondary | ICD-10-CM | POA: Diagnosis not present

## 2021-11-23 LAB — RAD ONC ARIA SESSION SUMMARY
Course Elapsed Days: 20
Plan Fractions Treated to Date: 15
Plan Prescribed Dose Per Fraction: 2 Gy
Plan Total Fractions Prescribed: 30
Plan Total Prescribed Dose: 60 Gy
Reference Point Dosage Given to Date: 30 Gy
Reference Point Session Dosage Given: 2 Gy
Session Number: 15

## 2021-11-23 MED ORDER — SODIUM CHLORIDE 0.9 % IV SOLN
45.0000 mg/m2 | Freq: Once | INTRAVENOUS | Status: AC
  Administered 2021-11-23: 96 mg via INTRAVENOUS
  Filled 2021-11-23: qty 16

## 2021-11-23 MED ORDER — SODIUM CHLORIDE 0.9 % IV SOLN: Freq: Once | INTRAVENOUS | Status: AC

## 2021-11-23 MED ORDER — SODIUM CHLORIDE 0.9 % IV SOLN
214.4000 mg | Freq: Once | INTRAVENOUS | Status: AC
  Administered 2021-11-23: 210 mg via INTRAVENOUS
  Filled 2021-11-23: qty 21

## 2021-11-23 MED ORDER — HEPARIN SOD (PORK) LOCK FLUSH 100 UNIT/ML IV SOLN
500.0000 [IU] | Freq: Once | INTRAVENOUS | Status: AC
  Administered 2021-11-23: 500 [IU] via INTRAVENOUS

## 2021-11-23 MED ORDER — SODIUM CHLORIDE 0.9 % IV SOLN
10.0000 mg | Freq: Once | INTRAVENOUS | Status: DC
  Filled 2021-11-23: qty 1

## 2021-11-23 MED ORDER — FAMOTIDINE IN NACL 20-0.9 MG/50ML-% IV SOLN
20.0000 mg | Freq: Once | INTRAVENOUS | Status: AC
  Administered 2021-11-23: 20 mg via INTRAVENOUS
  Filled 2021-11-23: qty 50

## 2021-11-23 MED ORDER — SODIUM CHLORIDE 0.9% FLUSH
10.0000 mL | Freq: Once | INTRAVENOUS | Status: AC
  Administered 2021-11-23: 10 mL via INTRAVENOUS

## 2021-11-23 MED ORDER — PALONOSETRON HCL INJECTION 0.25 MG/5ML
0.2500 mg | Freq: Once | INTRAVENOUS | Status: AC
  Administered 2021-11-23: 0.25 mg via INTRAVENOUS
  Filled 2021-11-23: qty 5

## 2021-11-23 MED ORDER — SODIUM CHLORIDE 0.9 % IV SOLN
10.0000 mg | Freq: Once | INTRAVENOUS | Status: AC
  Administered 2021-11-23: 10 mg via INTRAVENOUS
  Filled 2021-11-23: qty 10

## 2021-11-23 MED ORDER — DIPHENHYDRAMINE HCL 50 MG/ML IJ SOLN
25.0000 mg | Freq: Once | INTRAMUSCULAR | Status: AC
  Administered 2021-11-23: 25 mg via INTRAVENOUS
  Filled 2021-11-23: qty 1

## 2021-11-23 NOTE — Progress Notes (Signed)
[facility] [phone]   [phone]  PROGRESS NOTE  Patient Care Team: [name], MD as PCP - General (Internal Medicine) Herminio Commons, MD (Inactive) as PCP - Cardiology (Cardiology) Whitney Muse, Kelby Fam, MD (Inactive) as Consulting Physician (Hematology and Oncology) Erroll Luna, MD as Consulting Physician (General Surgery) Thea Silversmith, MD as Consulting Physician (Radiation Oncology) Sylvan Cheese, NP as Nurse Practitioner (Hematology and Oncology) Danie Binder, MD (Inactive) as Consulting Physician (Gastroenterology) Malmfelt, Stephani Police, RN as Oncology Nurse Navigator Melida Quitter, MD as Consulting Physician (Otolaryngology) Eppie Gibson, MD as Consulting Physician (Radiation Oncology) Benay Pike, MD as Consulting Physician (Hematology and Oncology)  CHIEF COMPLAINTS/PURPOSE OF CONSULTATION:  1.Malignant neoplasm of glottis/supraglottis  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 3. History of left breast cancer  Oncology History  Carcinoma of upper-outer quadrant of left female breast ([name])  06/24/2014 Mammogram   Left breast: 6 x 3 x 2.5 cm area of ill-defined increased density in the UOQ,  corresponding to the mass felt by the patient, not in the same location of the previously biopsied benign calcifications.     06/24/2014 Breast US   Left breast: ill-defined predominantly hypoechoic area with some increased echogenicity in the 2 o'clock position of the left breast, 7 cm from the nipple. 2.6 x 2.3 x 1.8 cm in maximum dimensions.    07/01/2014 Initial Biopsy   Left breast core needle bx: Invasive mammary carcinoma with lobular features, ER+ (100%), PR+ (91%), HER2/neu negative (ratio 1.09), Ki-67 32%. E-cadherin strongly positive, diagnostic for IDC     07/01/2014 Clinical Stage   Stage IIA: T2 N0    08/20/2014 Definitive Surgery   Left breast seed localized lumpectomy/SLNB: IDC, +LVI, DCIS, 1 LN  removed and positive for metastatic carcinoma. Grade 2. HER2/neu repeated and negative (ratio 0.69-1.9).     08/20/2014 Pathologic Stage   Stage IIB: pT2 pN1a pMx    09/17/2014 Surgery   Port-a cath placement and re-excision    10/02/2014 - 01/15/2015 Chemotherapy   CMF x 6 cycles.  patient refused Taxane-containing chemotherapy.    03/07/2015 Procedure   Comp Cancer Panel reveals no variant at APC, ATM, AXIN2, BARD1, BMPR1A, BRCA1, BRCA2, BRIP1, CDH1, CDK4, CDKN2A, CHEK2, FANCC, MLH1, MSH2, MSH6, MUTYH, NBN, PALB2, PMS2, POLD1, POLE, PTEN, RAD51C, RAD51D, SMAD4, STK11, TP53, VHL, and XRCC2.     04/06/2015 - 05/21/2015 Radiation Therapy   Adjuvant RT Pablo Ledger): Left breast/ 45 Gy over 25 fractions.   Left supraclavicular fossa and axilla/ 45 Gy over 25 fractions. Left breast boost/ 16 Gy over 8 fractions. Total dose: 61 Gy    06/04/2015 - 08/07/2015 Anti-estrogen oral therapy   Exemestane 25 mg daily.  Planned duration of therapy 5 years.     07/23/2015 Survivorship   Survivorship care plan completed and mailed to patient in lieu of in person visit    08/06/2015 Adverse Reaction   Severe hot flashes and mood swings    08/08/2015 - 12/07/2015 Anti-estrogen oral therapy   Arimidex    12/04/2015 Adverse Reaction   Worsening depression, patient stopped Arimidex    12/07/2015 -  Anti-estrogen oral therapy   Tamoxifen    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[place])  07/30/2021 Initial Diagnosis   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[place]) P 16 positive cT1N1   08/16/2021 - 09/13/2021 Radiation Therapy   Completed definitive radiation   09/30/2021 Pathology Results   [facility] Pathology PDL 1 29%   Squamous cell lung cancer (  HCC)  07/23/2021 Imaging    PET/CT  demonstrated small right glottic lesion is mildly hypermetabolic and consistent with no neoplasm.  PET also demonstrated borderline right level 2 lymph node left supraclavicular, mediastinal and hilar lymphadenopathy and  hypermetabolic left lower lobe pulmonary nodule all suspicious of metastatic focus.  No abdominal pelvic metastatic disease or osseous metastatic disease was appreciated.   08/31/2021 Initial Diagnosis   Squamous cell carcinoma of lung (HCC)    09/08/2021 Cancer Staging   Staging form: Lung, AJCC 8th Edition - Clinical stage from 09/08/2021: Stage IIIB (cT1b, cN3, cM0) - Signed by Lonie Peak, MD on 09/08/2021 Stage prefix: Initial diagnosis    09/30/2021 Pathology Results   [facility] Pathology PDL 1 29%   11/01/2021 -  Chemotherapy   Patient is on Treatment Plan : LUNG Carboplatin / Paclitaxel + XRT q7d         INTERIM HISTORY: [name] 71 y.o. female returns for a follow up prior to C4D1 of weekly carbo/taxol given concurrent with radiation for squamous  cell carcinoma of the lung. She is accompanied by her sister today.   She continues to tolerate the treatment well however she is quite anxious for today's treatment because of her reaction during last week's treatment.  She states that she felt extremely short of breath after finishing her treatment last week and is concerned it was due to the chemotherapy.  She was evaluated by her symptom management APP and felt symptoms could be secondary to COPD exacerbation.  Today, patient took 0.5 mg of Xanax to help relieve some of her anxiety.  She does have some fatigue with her treatment but continues to complete her daily activities on her own.She denies any nausea or vomiting since her last treatment. She does report diarrhea 1-2x following treatment but states this is tolerable. She experiences chronic SOB secondary to her COPD. She still is very hoarse due to her radiation therapy to the larynx. She does report weight loss and believes it's due to the pain she experiences upon swallowing. She has been drinking Enterade which she believes has helped her with her chemotherapy. She denies any fever, chills, or night sweats at this time.  Rest of the pertinent 10 point ROS reviewed and negative.  MEDICAL HISTORY:  Past Medical History:  Diagnosis Date   Arthritis    Asthma    Asymptomatic varicose veins    Breast cancer (HCC) 06/2014   left   Cancer (HCC)    Supra Glottis   Chronic airway obstruction (HCC)    Complication of anesthesia 2016   difficulty waking up, Oxygen dropped low.   COPD (chronic obstructive pulmonary disease) (HCC)    Depression    Dyspnea    GERD (gastroesophageal reflux disease)    Hemorrhage rectum    and anus.   Hypertension    Insomnia    Other symptoms involving digestive system(787.99)    Pain    Hx.pain in joint involving pelvic region and thigh; pain in limb   Palpitations    Panic attacks    Personal history of chemotherapy 2016   Personal history of radiation therapy 2016   Pneumonia    Pre-diabetes    Prediabetes    Sinusitis    Symptomatic menopausal or female climacteric states    Varicose veins of both lower extremities with pain     SURGICAL HISTORY: Past Surgical History:  Procedure Laterality Date   BREAST BIOPSY Left 10/2012   BREAST BIOPSY  Left 07/01/2014   malignant   BREAST LUMPECTOMY Left 08/20/2014   BRONCHIAL BIOPSY  08/23/2021   Procedure: BRONCHIAL BIOPSIES;  Surgeon: Leslye Peer, MD;  Location: [facility] ENDOSCOPY;  Service: Pulmonary;;   BRONCHIAL BRUSHINGS  08/23/2021   Procedure: BRONCHIAL BRUSHINGS;  Surgeon: Leslye Peer, MD;  Location: [facility] ENDOSCOPY;  Service: Pulmonary;;   BRONCHIAL NEEDLE ASPIRATION BIOPSY  08/23/2021   Procedure: BRONCHIAL NEEDLE ASPIRATION BIOPSIES;  Surgeon: Leslye Peer, MD;  Location: [facility] ENDOSCOPY;  Service: Pulmonary;;   BRONCHIAL WASHINGS  08/23/2021   Procedure: BRONCHIAL WASHINGS;  Surgeon: Leslye Peer, MD;  Location: [facility] ENDOSCOPY;  Service: Pulmonary;;   CESAREAN SECTION     CHOLECYSTECTOMY  1985   COLONOSCOPY  2002   Dr. Cleotis Nipper: internal hemorrhoids, one small rectal polyp, adenomatous path    COLONOSCOPY  N/A 11/23/2015   Procedure: COLONOSCOPY;  Surgeon: West Bali, MD;  Location: [facility] ENDO SUITE;  Service: Endoscopy;  Laterality: N/A;  1100 - moved to 10:45 - office to notify   ESOPHAGOGASTRODUODENOSCOPY N/A 11/23/2015   Procedure: ESOPHAGOGASTRODUODENOSCOPY (EGD);  Surgeon: West Bali, MD;  Location: [facility] END[facility];  Service: Endoscopy;  Laterality: N/A;   IR IMAGING GUIDED PORT INSERTION  11/08/2021   PORT-A-CATH REMOVAL  02/2015   PORTACATH PLACEMENT Right 09/17/2014   Procedure: INSERTION PORT-A-CATH WITH ULTRASOUND;  Surgeon: Harriette Bouillon, MD;  Location: [facility];  Service: General;  Laterality: Right;  IJ   RADIOACTIVE SEED GUIDED PARTIAL MASTECTOMY WITH AXILLARY SENTINEL LYMPH NODE BIOPSY Left 08/20/2014   Procedure: LEFT BREAST LUMPECTOMY WITH RADIOACTIVE SEED LOCALIZATION AND SENTINEL LYMPH NODE MAPPING;  Surgeon: Harriette Bouillon, MD;  Location: [facility];  Service: General;  Laterality: Left;   RE-EXCISION OF BREAST LUMPECTOMY Left 09/17/2014   Procedure: RE-EXCISION OF BREAST LUMPECTOMY;  Surgeon: Harriette Bouillon, MD;  Location: [facility];  Service: General;  Laterality: Left;   TOTAL HIP ARTHROPLASTY Right 02/22/2021   Procedure: RIGHT TOTAL HIP ARTHROPLASTY ANTERIOR APPROACH;  Surgeon: Eldred Manges, MD;  Location: [facility] OR;  Service: Orthopedics;  Laterality: Right;   TUBAL LIGATION     VIDEO BRONCHOSCOPY WITH ENDOBRONCHIAL ULTRASOUND N/A 08/23/2021   Procedure: VIDEO BRONCHOSCOPY WITH ENDOBRONCHIAL ULTRASOUND;  Surgeon: Leslye Peer, MD;  Location: [facility] ENDOSCOPY;  Service: Pulmonary;  Laterality: N/A;   VIDEO BRONCHOSCOPY WITH RADIAL ENDOBRONCHIAL ULTRASOUND  08/23/2021   Procedure: VIDEO BRONCHOSCOPY WITH RADIAL ENDOBRONCHIAL ULTRASOUND;  Surgeon: Leslye Peer, MD;  Location: [facility] ENDOSCOPY;  Service: Pulmonary;;    SOCIAL HISTORY: Social History   Socioeconomic History   Marital status: Divorced    Spouse name: Not on file   Number  of children: 2   Years of education: Not on file   Highest education level: Not on file  Occupational History   Not on file  Tobacco Use   Smoking status: Former    Packs/day: 1.00    Years: 42.00    Pack years: 42.00    Types: Cigarettes    Quit date: 02/22/2021    Years since quitting: 0.7   Smokeless tobacco: Never  Vaping Use   Vaping Use: Never used  Substance and Sexual Activity   Alcohol use: No    Alcohol/week: 0.0 standard drinks   Drug use: No   Sexual activity: Never    Birth control/protection: None  Other Topics Concern   Not on file  Social History Narrative   Not on file   Social Determinants of  Health   Financial Resource Strain: Not on file  Food Insecurity: Not on file  Transportation Needs: Not on file  Physical Activity: Not on file  Stress: Not on file  Social Connections: Unknown   Frequency of Communication with Friends and Family: More than three times a week   Frequency of Social Gatherings with Friends and Family: More than three times a week   Attends Religious Services: Not on Scientist, clinical (histocompatibility and immunogenetics) or Organizations: Not on file   Attends Banker Meetings: Not on file   Marital Status: Not on file  Intimate Partner Violence: Not on file    FAMILY HISTORY: Family History  Problem Relation Age of Onset   Diabetes Mother    Ovarian cancer Mother 34   Other Father        died in MVA at age 47   Other Sister        mitral valve disorder   Melanoma Sister 88       removed from back of leg   Colon cancer Brother        early 15s, succumbed to disease   Cancer Paternal Aunt        leukemia, bone, or lung cancer   Alzheimer's disease Maternal Grandmother    Heart attack Maternal Grandfather    Heart attack Paternal Grandmother    COPD Paternal Grandfather    Emphysema Paternal Grandfather    Congestive Heart Failure Paternal Aunt    Stomach cancer Paternal Uncle    Kidney cancer Maternal Uncle    Cancer Cousin         dx. teens/dx. 40s   Cancer Cousin    Colon cancer Cousin     ALLERGIES:  has No Known Allergies.  MEDICATIONS:  Current Outpatient Medications  Medication Sig Dispense Refill   albuterol (PROVENTIL HFA;VENTOLIN HFA) 108 (90 BASE) MCG/ACT inhaler Inhale 2 puffs into the lungs every 6 (six) hours as needed for wheezing or shortness of breath.      ALPRAZolam (XANAX) 0.5 MG tablet Take 0.5 mg by mouth 3 (three) times daily.     ANORO ELLIPTA 62.5-25 MCG/INH AEPB Inhale 1 puff into the lungs daily.     aspirin EC 325 MG EC tablet Take 1 tablet (325 mg total) by mouth daily with breakfast. Must take at least 4 weeks postop for DVT prophylaxis 30 tablet 0   calcium carbonate (TUMS - DOSED IN MG ELEMENTAL CALCIUM) 500 MG chewable tablet Chew 2 tablets by mouth 3 (three) times daily as needed for indigestion or heartburn.     fluconazole (DIFLUCAN) 100 MG tablet Take 2 tablets today, then 1 tablet daily x 20 more days. 22 tablet 0   furosemide (LASIX) 20 MG tablet Take 20 mg by mouth daily.     hydrochlorothiazide (HYDRODIURIL) 12.5 MG tablet Take 12.5 mg by mouth daily.     HYDROcodone-acetaminophen (NORCO) 5-325 MG tablet Take 1 tablet by mouth every 6 (six) hours as needed for moderate pain. 30 tablet 0   ibuprofen (ADVIL) 800 MG tablet Take 800 mg by mouth every 8 (eight) hours as needed for moderate pain.     lidocaine (XYLOCAINE) 2 % solution Patient: Mix 1part 2% viscous lidocaine, 1part H20. Swallow 10mL of diluted mixture, before meals and at bedtime, up to QID for sore throat. 200 mL 3   lidocaine-prilocaine (EMLA) cream Apply 1 application. topically as needed. 30 g 0   Menthol, Topical Analgesic, (BIOFREEZE  EX) Apply 1 application topically daily as needed (pain).     nystatin (MYCOSTATIN) 100000 UNIT/ML suspension Take 5 mLs (500,000 Units total) by mouth 4 (four) times daily. 60 mL 0   ondansetron (ZOFRAN) 8 MG tablet Take 1 tablet (8 mg total) by mouth every 8 (eight) hours  as needed for nausea or vomiting. 60 tablet 0   pantoprazole (PROTONIX) 40 MG tablet Take 1 tablet by mouth once daily 90 tablet 0   potassium chloride (KLOR-CON M) 10 MEQ tablet Take 10 mEq by mouth daily. Patient stated stated that she was started on 20 meq daily in August.     potassium chloride (MICRO-K) 10 MEQ CR capsule Take 2 capsules (20 mEq total) by mouth 2 (two) times daily. 120 capsule 2   prochlorperazine (COMPAZINE) 10 MG tablet Take 1 tablet (10 mg total) by mouth every 6 (six) hours as needed for nausea or vomiting. 60 tablet 0   sertraline (ZOLOFT) 100 MG tablet Take 100 mg by mouth daily.     sucralfate (CARAFATE) 1 g tablet Dissolve 1 tablet in 10 mL H20 and swallow 30 min prior to meals and bedtime. 40 tablet 3   No current facility-administered medications for this visit.   Facility-Administered Medications Ordered in Other Visits  Medication Dose Route Frequency Provider Last Rate Last Admin   CARBOplatin (PARAPLATIN) 210 mg in sodium chloride 0.9 % 250 mL chemo infusion  210 mg Intravenous Once Iruku, Burnice Logan, MD       PACLitaxel (TAXOL) 96 mg in sodium chloride 0.9 % 250 mL chemo infusion (</= /m2)  45 mg/m2 (Treatment Plan Recorded) Intravenous Once Iruku, Burnice Logan, MD          PHYSICAL EXAMINATION: ECOG PERFORMANCE STATUS: 1 - Symptomatic but completely ambulatory  Vitals:   11/23/21 1042  BP: 127/76  Pulse: 83  Resp: 16  Temp: 98.1 F (36.7 C)  SpO2: 95%   Filed Weights   11/23/21 1042  Weight: 204 lb 11.2 oz (92.9 kg)   Physical Exam Constitutional:      Appearance: Normal appearance.  Cardiovascular:     Rate and Rhythm: Normal rate and regular rhythm.     Pulses: Normal pulses.     Heart sounds: Normal heart sounds.  Pulmonary:     Effort: Pulmonary effort is normal.     Breath sounds: Normal breath sounds.  Abdominal:     General: Abdomen is flat.     Palpations: Abdomen is soft.  Musculoskeletal:     Cervical back: Normal range of  motion and neck supple. No rigidity.  Lymphadenopathy:     Cervical: No cervical adenopathy.  Skin:    General: Skin is warm and dry.  Neurological:     General: No focal deficit present.     Mental Status: She is alert.     LABORATORY DATA:  I have reviewed the data as listed    Latest Ref Rng & Units 11/22/2021    2:46 PM 11/15/2021    2:29 PM 11/08/2021    2:41 PM  CBC  WBC 4.0 - 10.5 K/uL 3.1   3.0   4.9    Hemoglobin 12.0 - 15.0 g/dL 16.1   09.6   04.5    Hematocrit 36.0 - 46.0 % 36.2   37.2   36.3    Platelets 150 - 400 K/uL 132   174   194         Latest Ref Rng & Units 11/22/2021  2:46 PM 11/15/2021    2:29 PM 11/09/2021   12:39 PM  CMP  Glucose 70 - 99 mg/dL 161   096   045    BUN 8 - 23 mg/dL Creatinine 0.44 - 1.00 mg/dL 4.09   8.11   9.14    Sodium 135 - 145 mmol/L 140   141   141    Potassium 3.5 - 5.1 mmol/L 4.1   4.9   2.9    Chloride 98 - 111 mmol/L 102   103   100    CO2 22 - 32 mmol/L 33   34   35    Calcium 8.9 - 10.3 mg/dL 78.2   9.7   9.3    Total Protein 6.5 - 8.1 g/dL 7.1   7.1   6.9    Total Bilirubin 0.3 - 1.2 mg/dL 1.6   1.5   1.2    Alkaline Phos 38 - 126 U/L 70   77   68    AST 15 - 41 U/L ALT 0 - 44 U/L RADIOGRAPHIC STUDIES: I have personally reviewed the radiological images as listed and agreed with the findings in the report. IR IMAGING GUIDED PORT INSERTION  Result Date: 11/08/2021 CLINICAL DATA:  Lung carcinoma, history of bilateral axillary lymph node dissection, needs durable venous access for treatment regimen EXAM: TUNNELED PORT CATHETER PLACEMENT WITH ULTRASOUND AND FLUOROSCOPIC GUIDANCE FLUOROSCOPY: Radiation Exposure Index (as provided by the fluoroscopic device): 44 mGy air Kerma ANESTHESIA/SEDATION: Intravenous Fentanyl and Versed  were administered as conscious sedation during continuous monitoring of the patient's level of consciousness and physiological / cardiorespiratory  status by the radiology RN, with a total moderate sedation time of 28 minutes. TECHNIQUE: The procedure, risks, benefits, and alternatives were explained to the patient. Questions regarding the procedure were encouraged and answered. The patient understands and consents to the procedure. Patency of the right IJ vein was confirmed with ultrasound with image documentation. An appropriate skin site was determined. Skin site was marked. Region was prepped using maximum barrier technique including cap and mask, sterile gown, sterile gloves, large sterile sheet, and Chlorhexidine as cutaneous antisepsis. The region was infiltrated locally with 1% lidocaine. Under real-time ultrasound guidance, the right IJ vein was accessed with a 21 gauge micropuncture needle; the needle tip within the vein was confirmed with ultrasound image documentation. The guidewire would not advance centrally. 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 The transitional dilator set was utilized through which an angled stiff glidewire was advanced through the stenotic right IJ vein and advanced into the IVC. A 5 French angiographic catheter was placed. Vascular measurement was performed. A small incision was made on the right anterior chest wall and a subcutaneous pocket fashioned. The power-injectable port was positioned and its catheter tunneled to the right IJ dermatotomy site. The angiographic catheter was exchanged over an Amplatz wire for a peel-away sheath, through which the port catheter, which had been trimmed to the appropriate length, was advanced and positioned under fluoroscopy with its tip at the cavoatrial junction. Spot chest radiograph confirms good catheter position and no pneumothorax. The port was flushed per protocol. The pocket was closed with deep interrupted and subcuticular continuous 3-0 Monocryl sutures. The incisions were covered with Dermabond then  covered with a sterile dressing. The patient tolerated the procedure well. COMPLICATIONS: COMPLICATIONS None immediate IMPRESSION: Technically successful right IJ power-injectable port catheter placement. Ready for routine use. Electronically Signed   By: Corlis Leak M.D.   On: 11/08/2021 14:50    ASSESSMENT & PLAN DEAISHA NICHOLL is a 71 y.o. female who returns for a follow up.  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 --Underwent EBUS with biopsy of left lower lobe pulmonary nodule, Level 4R and 7 lymph nodes. Pathology confirmed squamous cell carcinoma of left lower lobe nodule along with level 4R and 7 level nodules --Started concurrent chemoradiation with weekly carbo/taxol on 11/01/2021. -- PD-L1 29% hence she is not a candidate for single agent immunotherapy.  We will consider adjuvant immunotherapy after completion of concurrent chemoradiation for 1 year. --She is due for Cycle 4, Day 1 of carbo/taxol. Labs from yesterday were reviewed and adequate for treatment today.  --Recommend to proceed with weekly carbo/taxol.  --RTC next week for labs and follow up visit with Dr. Al Pimple before Cycle 6, Day 1 of carbo/taxol.   2. Malignant neoplasm of supraglottis --Thyroid US on 06/03/21 revealed a suspicious nodule located in the mid right thyroid lobe, measuring 5.0 cm in the greatest extent --Underwent laryngoscopy on 06/07/2021 which revealed a granular mass of the right glottis suggestive of glottic cancer. CT neck from 06/23/2021 showed  9 mm nodule right vocal cord. No enlarged lymph nodes were appreciated in the neck. An enlarged right paratracheal lymph node was appreciated as well, noted as suspicious for metastatic disease, as well as the right thyroid nodule measuring 39 x 34 mm.  --Proceeded with biopsy of the right vocal cord mass on 06/25/21 which revealed squamous cell carcinoma; p16 positive. --PET on 07/23/21 demonstrated the small right glottic lesion as mildly hypermetabolic and consistent with known neoplasm. PET also demonstrated: a borderline right level 2 lymph node; left supraclavicular, mediastinal and hilar lymphadenopathy; and a hypermetabolic left lower lobe pulmonary nodule, noted as likely a metastatic focus. No other findings for abdominal/pelvic metastatic disease or osseous metastatic disease were appreciated.  --Received definitive radiation from 08/16/2021 through 09/13/2021. 54 Gy in 20 Fx.  --Post treatment CT neck from 10/18/2021 showed resolved polypoid lesion of the larynx. No worrisome nodes in the neck. --Patient had f/u with Dr. Jenne Pane (ENT) on 11/19/2021 and underwent fiberoptic exam that showed no evidence of persistent laryngeal cancer.  --Continue with surveillance  visits with Dr. Jenne Pane, plan to follow up around July 2023.   3. 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 --She is due for mammogram, will plan to schedule once lung cancer therapy is complete  4. Hypokalemia: --Secondary to diarrhea and lasix.  --Currently on potassium chloride 20 meq daily. --Potassium level is 4.1 yesterday.   5. Nausea: --Has Zofran and Compazine to take as needed. Advised to avoid taking Compazine and Xanax simultaneously to avoid sedation.  She prefers Zofran because this does not make her sleepy.  6.  Diffuse body aches: -Currently takes 800 of Aleve  every day for pain control. -She very rarely uses norco  7. Oral thrush-resolved: --Treated with Nystatin mouthwash.   8. Diarrhea: --Secondary to chemotherapy --Advised to take imodium as needed and stay hydrated - Currently grade 1  9. SOB: --Experienced last week likely due to COPD exacerbation --O2 saturation is 95% today.  --Monitor closely.    No orders of the defined types were placed in this encounter.   All questions were answered. The patient knows to call the clinic with any problems, questions or concerns.  I have spent a total of 30 minutes minutes of face-to-face and non-face-to-face time, preparing to see the patient,  performing a medically appropriate examination, counseling and educating the patient, ordering medications/tests, documenting clinical information in the electronic health record,  and care coordination.   Georga Kaufmann PA-C Dept of Hematology and Oncology [place] [facility] Phone: [phone]

## 2021-11-24 ENCOUNTER — Ambulatory Visit
Admission: RE | Admit: 2021-11-24 | Discharge: 2021-11-24 | Disposition: A | Discharge status: Home / Self Care | Payer: Medicare HMO | Source: Ambulatory Visit | Attending: Radiation Oncology | Admitting: Radiation Oncology

## 2021-11-24 ENCOUNTER — Other Ambulatory Visit: Payer: Self-pay

## 2021-11-24 DIAGNOSIS — Z87891 Personal history of nicotine dependence: Secondary | ICD-10-CM | POA: Diagnosis not present

## 2021-11-24 DIAGNOSIS — C320 Malignant neoplasm of glottis: Secondary | ICD-10-CM | POA: Diagnosis not present

## 2021-11-24 DIAGNOSIS — C3432 Malignant neoplasm of lower lobe, left bronchus or lung: Secondary | ICD-10-CM | POA: Diagnosis not present

## 2021-11-24 DIAGNOSIS — Z510 Encounter for antineoplastic radiation therapy: Secondary | ICD-10-CM | POA: Diagnosis not present

## 2021-11-24 LAB — RAD ONC ARIA SESSION SUMMARY
Course Elapsed Days: 21
Plan Fractions Treated to Date: 16
Plan Prescribed Dose Per Fraction: 2 Gy
Plan Total Fractions Prescribed: 30
Plan Total Prescribed Dose: 60 Gy
Reference Point Dosage Given to Date: 32 Gy
Reference Point Session Dosage Given: 2 Gy
Session Number: 16

## 2021-11-25 ENCOUNTER — Other Ambulatory Visit: Payer: Self-pay

## 2021-11-25 ENCOUNTER — Ambulatory Visit
Admission: RE | Admit: 2021-11-25 | Discharge: 2021-11-25 | Disposition: A | Discharge status: Home / Self Care | Payer: Medicare HMO | Source: Ambulatory Visit | Attending: Radiation Oncology | Admitting: Radiation Oncology

## 2021-11-25 DIAGNOSIS — C320 Malignant neoplasm of glottis: Secondary | ICD-10-CM | POA: Diagnosis not present

## 2021-11-25 DIAGNOSIS — Z510 Encounter for antineoplastic radiation therapy: Secondary | ICD-10-CM | POA: Diagnosis not present

## 2021-11-25 DIAGNOSIS — Z87891 Personal history of nicotine dependence: Secondary | ICD-10-CM | POA: Diagnosis not present

## 2021-11-25 DIAGNOSIS — C3432 Malignant neoplasm of lower lobe, left bronchus or lung: Secondary | ICD-10-CM | POA: Diagnosis not present

## 2021-11-25 LAB — RAD ONC ARIA SESSION SUMMARY
Course Elapsed Days: 22
Plan Fractions Treated to Date: 17
Plan Prescribed Dose Per Fraction: 2 Gy
Plan Total Fractions Prescribed: 30
Plan Total Prescribed Dose: 60 Gy
Reference Point Dosage Given to Date: 34 Gy
Reference Point Session Dosage Given: 2 Gy
Session Number: 17

## 2021-11-26 ENCOUNTER — Other Ambulatory Visit: Payer: Self-pay

## 2021-11-26 ENCOUNTER — Ambulatory Visit
Admission: RE | Admit: 2021-11-26 | Discharge: 2021-11-26 | Disposition: A | Discharge status: Home / Self Care | Payer: Medicare HMO | Source: Ambulatory Visit | Attending: Radiation Oncology | Admitting: Radiation Oncology

## 2021-11-26 DIAGNOSIS — C3432 Malignant neoplasm of lower lobe, left bronchus or lung: Secondary | ICD-10-CM | POA: Diagnosis not present

## 2021-11-26 DIAGNOSIS — Z87891 Personal history of nicotine dependence: Secondary | ICD-10-CM | POA: Diagnosis not present

## 2021-11-26 DIAGNOSIS — Z510 Encounter for antineoplastic radiation therapy: Secondary | ICD-10-CM | POA: Diagnosis not present

## 2021-11-26 DIAGNOSIS — C320 Malignant neoplasm of glottis: Secondary | ICD-10-CM | POA: Diagnosis not present

## 2021-11-26 LAB — RAD ONC ARIA SESSION SUMMARY
Course Elapsed Days: 23
Plan Fractions Treated to Date: 18
Plan Prescribed Dose Per Fraction: 2 Gy
Plan Total Fractions Prescribed: 30
Plan Total Prescribed Dose: 60 Gy
Reference Point Dosage Given to Date: 36 Gy
Reference Point Session Dosage Given: 2 Gy
Session Number: 18

## 2021-11-29 ENCOUNTER — Ambulatory Visit
Admission: RE | Admit: 2021-11-29 | Discharge: 2021-11-29 | Disposition: A | Discharge status: Home / Self Care | Payer: Medicare HMO | Source: Ambulatory Visit | Attending: Radiation Oncology | Admitting: Radiation Oncology

## 2021-11-29 ENCOUNTER — Inpatient Hospital Stay (HOSPITAL_BASED_OUTPATIENT_CLINIC_OR_DEPARTMENT_OTHER): Payer: Medicare HMO | Admitting: Hematology and Oncology

## 2021-11-29 ENCOUNTER — Other Ambulatory Visit: Payer: Self-pay

## 2021-11-29 ENCOUNTER — Inpatient Hospital Stay: Payer: Medicare HMO

## 2021-11-29 ENCOUNTER — Inpatient Hospital Stay: Payer: Medicare HMO | Admitting: Nurse Practitioner

## 2021-11-29 VITALS — BP 143/88 | HR 107 | Temp 97.7°F | Wt 200.1 lb

## 2021-11-29 DIAGNOSIS — R0602 Shortness of breath: Secondary | ICD-10-CM | POA: Diagnosis not present

## 2021-11-29 DIAGNOSIS — Z87891 Personal history of nicotine dependence: Secondary | ICD-10-CM | POA: Diagnosis not present

## 2021-11-29 DIAGNOSIS — C321 Malignant neoplasm of supraglottis: Secondary | ICD-10-CM

## 2021-11-29 DIAGNOSIS — Z923 Personal history of irradiation: Secondary | ICD-10-CM | POA: Diagnosis not present

## 2021-11-29 DIAGNOSIS — Z170 Estrogen receptor positive status [ER+]: Secondary | ICD-10-CM

## 2021-11-29 DIAGNOSIS — C3490 Malignant neoplasm of unspecified part of unspecified bronchus or lung: Secondary | ICD-10-CM | POA: Diagnosis not present

## 2021-11-29 DIAGNOSIS — Z5111 Encounter for antineoplastic chemotherapy: Secondary | ICD-10-CM | POA: Diagnosis not present

## 2021-11-29 DIAGNOSIS — R070 Pain in throat: Secondary | ICD-10-CM | POA: Diagnosis not present

## 2021-11-29 DIAGNOSIS — D696 Thrombocytopenia, unspecified: Secondary | ICD-10-CM | POA: Diagnosis not present

## 2021-11-29 DIAGNOSIS — Z8249 Family history of ischemic heart disease and other diseases of the circulatory system: Secondary | ICD-10-CM | POA: Diagnosis not present

## 2021-11-29 DIAGNOSIS — B370 Candidal stomatitis: Secondary | ICD-10-CM | POA: Diagnosis not present

## 2021-11-29 DIAGNOSIS — Z510 Encounter for antineoplastic radiation therapy: Secondary | ICD-10-CM | POA: Diagnosis not present

## 2021-11-29 DIAGNOSIS — R197 Diarrhea, unspecified: Secondary | ICD-10-CM | POA: Diagnosis not present

## 2021-11-29 DIAGNOSIS — C320 Malignant neoplasm of glottis: Secondary | ICD-10-CM | POA: Diagnosis not present

## 2021-11-29 DIAGNOSIS — C50412 Malignant neoplasm of upper-outer quadrant of left female breast: Secondary | ICD-10-CM | POA: Diagnosis not present

## 2021-11-29 DIAGNOSIS — Z818 Family history of other mental and behavioral disorders: Secondary | ICD-10-CM | POA: Diagnosis not present

## 2021-11-29 DIAGNOSIS — E876 Hypokalemia: Secondary | ICD-10-CM

## 2021-11-29 DIAGNOSIS — Z833 Family history of diabetes mellitus: Secondary | ICD-10-CM | POA: Diagnosis not present

## 2021-11-29 DIAGNOSIS — Z800 Family history of malignant neoplasm of digestive organs: Secondary | ICD-10-CM | POA: Diagnosis not present

## 2021-11-29 DIAGNOSIS — R110 Nausea: Secondary | ICD-10-CM | POA: Diagnosis not present

## 2021-11-29 DIAGNOSIS — Z79899 Other long term (current) drug therapy: Secondary | ICD-10-CM | POA: Diagnosis not present

## 2021-11-29 DIAGNOSIS — C3432 Malignant neoplasm of lower lobe, left bronchus or lung: Secondary | ICD-10-CM | POA: Diagnosis not present

## 2021-11-29 DIAGNOSIS — Z8041 Family history of malignant neoplasm of ovary: Secondary | ICD-10-CM | POA: Diagnosis not present

## 2021-11-29 DIAGNOSIS — D649 Anemia, unspecified: Secondary | ICD-10-CM | POA: Diagnosis not present

## 2021-11-29 DIAGNOSIS — R5383 Other fatigue: Secondary | ICD-10-CM | POA: Diagnosis not present

## 2021-11-29 DIAGNOSIS — Z806 Family history of leukemia: Secondary | ICD-10-CM | POA: Diagnosis not present

## 2021-11-29 DIAGNOSIS — Z9221 Personal history of antineoplastic chemotherapy: Secondary | ICD-10-CM | POA: Diagnosis not present

## 2021-11-29 LAB — COMPREHENSIVE METABOLIC PANEL
ALT: 11 U/L (ref 0–44)
AST: 13 U/L — ABNORMAL LOW (ref 15–41)
Albumin: 4.1 g/dL (ref 3.5–5.0)
Alkaline Phosphatase: 76 U/L (ref 38–126)
Anion gap: 5 (ref 5–15)
BUN: 21 mg/dL (ref 8–23)
CO2: 34 mmol/L — ABNORMAL HIGH (ref 22–32)
Calcium: 9.9 mg/dL (ref 8.9–10.3)
Chloride: 101 mmol/L (ref 98–111)
Creatinine, Ser: 0.93 mg/dL (ref 0.44–1.00)
GFR, Estimated: >60 mL/min (ref >60)
Glucose, Bld: 150 mg/dL — ABNORMAL HIGH (ref 70–99)
Potassium: 3.7 mmol/L (ref 3.5–5.1)
Sodium: 140 mmol/L (ref 135–145)
Total Bilirubin: 2.6 mg/dL — ABNORMAL HIGH (ref 0.3–1.2)
Total Protein: 7.4 g/dL (ref 6.5–8.1)

## 2021-11-29 LAB — CBC WITH DIFFERENTIAL/PLATELET
Abs Immature Granulocytes: 0.01 10*3/uL (ref 0.00–0.07)
Basophils Absolute: 0 10*3/uL (ref 0.0–0.1)
Basophils Relative: 1 %
Eosinophils Absolute: 0 10*3/uL (ref 0.0–0.5)
Eosinophils Relative: 0 %
HCT: 32.8 % — ABNORMAL LOW (ref 36.0–46.0)
Hemoglobin: 11.7 g/dL — ABNORMAL LOW (ref 12.0–15.0)
Immature Granulocytes: 0 %
Lymphocytes Relative: 7 %
Lymphs Abs: 0.2 10*3/uL — ABNORMAL LOW (ref 0.7–4.0)
MCH: 30.9 pg (ref 26.0–34.0)
MCHC: 35.7 g/dL (ref 30.0–36.0)
MCV: 86.5 fL (ref 80.0–100.0)
Monocytes Absolute: 0.1 10*3/uL (ref 0.1–1.0)
Monocytes Relative: 6 %
Neutro Abs: 2.1 10*3/uL (ref 1.7–7.7)
Neutrophils Relative %: 86 %
Platelets: 56 10*3/uL — ABNORMAL LOW (ref 150–400)
RBC: 3.79 MIL/uL — ABNORMAL LOW (ref 3.87–5.11)
RDW: 13.7 % (ref 11.5–15.5)
WBC: 2.5 10*3/uL — ABNORMAL LOW (ref 4.0–10.5)
nRBC: 0 % (ref 0.0–0.2)

## 2021-11-29 LAB — RAD ONC ARIA SESSION SUMMARY
Course Elapsed Days: 26
Plan Fractions Treated to Date: 19
Plan Prescribed Dose Per Fraction: 2 Gy
Plan Total Fractions Prescribed: 30
Plan Total Prescribed Dose: 60 Gy
Reference Point Dosage Given to Date: 38 Gy
Reference Point Session Dosage Given: 2 Gy
Session Number: 19

## 2021-11-29 MED FILL — Dexamethasone Sodium Phosphate Inj 100 MG/10ML: INTRAMUSCULAR | Qty: 1 | Status: AC

## 2021-11-29 NOTE — Progress Notes (Signed)
[facility] Telephone:([phone]) [phone]   [phone]  PROGRESS NOTE  Patient Care Team: Benita Stabile, MD as PCP - General (Internal Medicine) Laqueta Linden, MD (Inactive) as PCP - Cardiology (Cardiology) Galen Manila, Novella Olive, MD (Inactive) as Consulting Physician (Hematology and Oncology) Harriette Bouillon, MD as Consulting Physician (General Surgery) Lurline Hare, MD as Consulting Physician (Radiation Oncology) Salomon Fick, NP as Nurse Practitioner (Hematology and Oncology) West Bali, MD (Inactive) as Consulting Physician (Gastroenterology) Malmfelt, Lise Auer, RN as Oncology Nurse Navigator Christia Reading, MD as Consulting Physician (Otolaryngology) Lonie Peak, MD as Consulting Physician (Radiation Oncology) [name], MD as Consulting Physician (Hematology and Oncology)  CHIEF COMPLAINTS/PURPOSE OF CONSULTATION:  1.Malignant neoplasm of glottis/supraglottis  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 3. 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    06/24/2014 Breast US   Left breast: ill-defined predominantly hypoechoic area with some increased echogenicity in the 2 o'clock position of the left breast, 7 cm from the nipple. 2.6 x 2.3 x 1.8 cm in maximum dimensions.   07/01/2014 Initial Biopsy   Left breast core needle bx: Invasive mammary carcinoma with lobular features, ER+ (100%), PR+ (91%), HER2/neu negative (ratio 1.09), Ki-67 32%. E-cadherin strongly positive, diagnostic for IDC    07/01/2014 Clinical Stage   Stage IIA: T2 N0   08/20/2014 Definitive Surgery   Left breast seed localized lumpectomy/SLNB: IDC, +LVI, DCIS, 1 LN removed and  positive for metastatic carcinoma. Grade 2. HER2/neu repeated and negative (ratio 0.69-1.9).    08/20/2014 Pathologic Stage   Stage IIB: pT2 pN1a pMx   09/17/2014 Surgery   Port-a cath placement and re-excision   10/02/2014 - 01/15/2015 Chemotherapy   CMF x 6 cycles.  patient refused Taxane-containing chemotherapy.   03/07/2015 Procedure   Comp Cancer Panel reveals no variant at APC, ATM, AXIN2, BARD1, BMPR1A, BRCA1, BRCA2, BRIP1, CDH1, CDK4, CDKN2A, CHEK2, FANCC, MLH1, MSH2, MSH6, MUTYH, NBN, PALB2, PMS2, POLD1, POLE, PTEN, RAD51C, RAD51D, SMAD4, STK11, TP53, VHL, and XRCC2.    04/06/2015 - 05/21/2015 Radiation Therapy   Adjuvant RT Michell Heinrich): Left breast/ 45 Gy over 25 fractions.   Left supraclavicular fossa and axilla/ 45 Gy over 25 fractions. Left breast boost/ 16 Gy over 8 fractions. Total dose: 61 Gy   06/04/2015 - 08/07/2015 Anti-estrogen oral therapy   Exemestane 25 mg daily.  Planned duration of therapy 5 years.    07/23/2015 Survivorship   Survivorship care plan completed and mailed to patient in lieu of in person visit   08/06/2015 Adverse Reaction   Severe hot flashes and mood swings   08/08/2015 - 12/07/2015 Anti-estrogen oral therapy   Arimidex   12/04/2015 Adverse Reaction   Worsening depression, patient stopped Arimidex   12/07/2015 -  Anti-estrogen oral therapy   Tamoxifen   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HCC)  07/30/2021 Initial Diagnosis   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HCC) P 16 positive cT1N1   08/16/2021 - 09/13/2021 Radiation Therapy   Completed definitive radiation   09/30/2021 Pathology Results   Guardant 360 Pathology PDL 1 29%   Squamous cell lung cancer (HCC)  07/23/2021 Imaging    PET/CT  demonstrated small right glottic lesion is mildly  hypermetabolic and consistent with no neoplasm.  PET also demonstrated borderline right level 2 lymph node left supraclavicular, mediastinal and hilar lymphadenopathy and hypermetabolic left lower lobe pulmonary  nodule all suspicious of metastatic focus.  No abdominal pelvic metastatic disease or osseous metastatic disease was appreciated.   08/31/2021 Initial Diagnosis   Squamous cell carcinoma of lung (HCC)   09/08/2021 Cancer Staging   Staging form: Lung, AJCC 8th Edition - Clinical stage from 09/08/2021: Stage IIIB (cT1b, cN3, cM0) - Signed by Lonie Peak, MD on 09/08/2021 Stage prefix: Initial diagnosis   09/30/2021 Pathology Results   Guardant 360 Pathology PDL 1 29%   11/01/2021 -  Chemotherapy   Patient is on Treatment Plan : LUNG Carboplatin / Paclitaxel + XRT q7d       INTERIM HISTORY: Regina Mcmahon 71 y.o. female returns for a follow up prior to C3D1 of weekly carbo/taxol given concurrent with radiation for squamous  cell carcinoma of the lung. She is accompanied by her sister today.  She has been tolerating chemotherapy very well overall she says.  She has some diarrhea on the day of chemotherapy.  She has suffered mild nausea without vomiting and she takes Zofran as needed for nausea.  No neuropathy.  No fevers or chills.  She is just hoping that her voice comes back soon.  She otherwise denies any changes in breathing or urinary habits.  Rest of the pertinent 10 point ROS reviewed and negative.  MEDICAL HISTORY:  Past Medical History:  Diagnosis Date   Arthritis    Asthma    Asymptomatic varicose veins    Breast cancer (HCC) 06/2014   left   Cancer (HCC)    Supra Glottis   Chronic airway obstruction (HCC)    Complication of anesthesia 2016   difficulty waking up, Oxygen dropped low.   COPD (chronic obstructive pulmonary disease) (HCC)    Depression    Dyspnea    GERD (gastroesophageal reflux disease)    Hemorrhage rectum    and anus.   Hypertension    Insomnia    Other symptoms involving digestive system(787.99)    Pain    Hx.pain in joint involving pelvic region and thigh; pain in limb   Palpitations    Panic attacks    Personal history of chemotherapy 2016    Personal history of radiation therapy 2016   Pneumonia    Pre-diabetes    Prediabetes    Sinusitis    Symptomatic menopausal or female climacteric states    Varicose veins of both lower extremities with pain     SURGICAL HISTORY: Past Surgical History:  Procedure Laterality Date   BREAST BIOPSY Left 10/2012   BREAST BIOPSY Left 07/01/2014   malignant   BREAST LUMPECTOMY Left 08/20/2014   BRONCHIAL BIOPSY  08/23/2021   Procedure: BRONCHIAL BIOPSIES;  Surgeon: Leslye Peer, MD;  Location: [facility];  Service: Pulmonary;;   BRONCHIAL BRUSHINGS  08/23/2021   Procedure: BRONCHIAL BRUSHINGS;  Surgeon: Leslye Peer, MD;  Location: [facility];  Service: Pulmonary;;   BRONCHIAL NEEDLE ASPIRATION BIOPSY  08/23/2021   Procedure: BRONCHIAL NEEDLE ASPIRATION BIOPSIES;  Surgeon: Leslye Peer, MD;  Location: [facility];  Service: Pulmonary;;   BRONCHIAL WASHINGS  08/23/2021   Procedure: BRONCHIAL WASHINGS;  Surgeon: Leslye Peer, MD;  Location: [facility];  Service: Pulmonary;;   CESAREAN SECTION     CHOLECYSTECTOMY  1985   COLONOSCOPY  2002   Dr. Cleotis Nipper: internal hemorrhoids, one small rectal polyp,  adenomatous path    COLONOSCOPY N/A 11/23/2015   Procedure: COLONOSCOPY;  Surgeon: West Bali, MD;  Location: [facility];  Service: Endoscopy;  Laterality: N/A;  1100 - moved to 10:45 - office to notify   ESOPHAGOGASTRODUODENOSCOPY N/A 11/23/2015   Procedure: ESOPHAGOGASTRODUODENOSCOPY (EGD);  Surgeon: West Bali, MD;  Location: [facility];  Service: Endoscopy;  Laterality: N/A;   IR IMAGING GUIDED PORT INSERTION  11/08/2021   PORT-A-CATH REMOVAL  02/2015   PORTACATH PLACEMENT Right 09/17/2014   Procedure: INSERTION PORT-A-CATH WITH ULTRASOUND;  Surgeon: Harriette Bouillon, MD;  Location: [facility];  Service: General;  Laterality: Right;  IJ   RADIOACTIVE SEED GUIDED PARTIAL MASTECTOMY WITH AXILLARY SENTINEL LYMPH NODE BIOPSY Left 08/20/2014   Procedure: LEFT  BREAST LUMPECTOMY WITH RADIOACTIVE SEED LOCALIZATION AND SENTINEL LYMPH NODE MAPPING;  Surgeon: Harriette Bouillon, MD;  Location: [facility];  Service: General;  Laterality: Left;   RE-EXCISION OF BREAST LUMPECTOMY Left 09/17/2014   Procedure: RE-EXCISION OF BREAST LUMPECTOMY;  Surgeon: Harriette Bouillon, MD;  Location: [facility];  Service: General;  Laterality: Left;   TOTAL HIP ARTHROPLASTY Right 02/22/2021   Procedure: RIGHT TOTAL HIP ARTHROPLASTY ANTERIOR APPROACH;  Surgeon: Eldred Manges, MD;  Location: [facility];  Service: Orthopedics;  Laterality: Right;   TUBAL LIGATION     VIDEO BRONCHOSCOPY WITH ENDOBRONCHIAL ULTRASOUND N/A 08/23/2021   Procedure: VIDEO BRONCHOSCOPY WITH ENDOBRONCHIAL ULTRASOUND;  Surgeon: Leslye Peer, MD;  Location: [facility];  Service: Pulmonary;  Laterality: N/A;   VIDEO BRONCHOSCOPY WITH RADIAL ENDOBRONCHIAL ULTRASOUND  08/23/2021   Procedure: VIDEO BRONCHOSCOPY WITH RADIAL ENDOBRONCHIAL ULTRASOUND;  Surgeon: Leslye Peer, MD;  Location: [facility];  Service: Pulmonary;;    SOCIAL HISTORY: Social History   Socioeconomic History   Marital status: Divorced    Spouse name: Not on file   Number of children: 2   Years of education: Not on file   Highest education level: Not on file  Occupational History   Not on file  Tobacco Use   Smoking status: Former    Packs/day: 1.00    Years: 42.00    Pack years: 42.00    Types: Cigarettes    Quit date: 02/22/2021    Years since quitting: 0.7   Smokeless tobacco: Never  Vaping Use   Vaping Use: Never used  Substance and Sexual Activity   Alcohol use: No    Alcohol/week: 0.0 standard drinks   Drug use: No   Sexual activity: Never    Birth control/protection: None  Other Topics Concern   Not on file  Social History Narrative   Not on file   Social Determinants of Health   Financial Resource Strain: Not on file  Food Insecurity: Not on file  Transportation Needs: Not on file   Physical Activity: Not on file  Stress: Not on file  Social Connections: Unknown   Frequency of Communication with Friends and Family: More than three times a week   Frequency of Social Gatherings with Friends and Family: More than three times a week   Attends Religious Services: Not on Scientist, clinical (histocompatibility and immunogenetics) or Organizations: Not on file   Attends Banker Meetings: Not on file   Marital Status: Not on file  Intimate Partner Violence: Not on file    FAMILY HISTORY: Family History  Problem Relation Age of Onset   Diabetes Mother    Ovarian cancer Mother 61   Other  Father        died in MVA at age 80   Other Sister        mitral valve disorder   Melanoma Sister 45       removed from back of leg   Colon cancer Brother        early 46s, succumbed to disease   Cancer Paternal Aunt        leukemia, bone, or lung cancer   Alzheimer's disease Maternal Grandmother    Heart attack Maternal Grandfather    Heart attack Paternal Grandmother    COPD Paternal Grandfather    Emphysema Paternal Grandfather    Congestive Heart Failure Paternal Aunt    Stomach cancer Paternal Uncle    Kidney cancer Maternal Uncle    Cancer Cousin        dx. teens/dx. 40s   Cancer Cousin    Colon cancer Cousin     ALLERGIES:  has No Known Allergies.  MEDICATIONS:  Current Outpatient Medications  Medication Sig Dispense Refill   albuterol (PROVENTIL HFA;VENTOLIN HFA) 108 (90 BASE) MCG/ACT inhaler Inhale 2 puffs into the lungs every 6 (six) hours as needed for wheezing or shortness of breath.      ALPRAZolam (XANAX) 0.5 MG tablet Take 0.5 mg by mouth 3 (three) times daily.     ANORO ELLIPTA 62.5-25 MCG/INH AEPB Inhale 1 puff into the lungs daily.     aspirin EC 325 MG EC tablet Take 1 tablet (325 mg total) by mouth daily with breakfast. Must take at least 4 weeks postop for DVT prophylaxis 30 tablet 0   calcium carbonate (TUMS - DOSED IN MG ELEMENTAL CALCIUM) 500 MG chewable tablet  Chew 2 tablets by mouth 3 (three) times daily as needed for indigestion or heartburn.     fluconazole (DIFLUCAN) 100 MG tablet Take 2 tablets today, then 1 tablet daily x 20 more days. 22 tablet 0   furosemide (LASIX) 20 MG tablet Take 20 mg by mouth daily.     hydrochlorothiazide (HYDRODIURIL) 12.5 MG tablet Take 12.5 mg by mouth daily.     HYDROcodone-acetaminophen (NORCO) 5-325 MG tablet Take 1 tablet by mouth every 6 (six) hours as needed for moderate pain. 30 tablet 0   ibuprofen (ADVIL) 800 MG tablet Take 800 mg by mouth every 8 (eight) hours as needed for moderate pain.     lidocaine (XYLOCAINE) 2 % solution Patient: Mix 1part 2% viscous lidocaine, 1part H20. Swallow 10mL of diluted mixture, before meals and at bedtime, up to QID for sore throat. 200 mL 3   lidocaine-prilocaine (EMLA) cream Apply 1 application. topically as needed. 30 g 0   Menthol, Topical Analgesic, (BIOFREEZE EX) Apply 1 application topically daily as needed (pain).     nystatin (MYCOSTATIN) 100000 UNIT/ML suspension Take 5 mLs (500,000 Units total) by mouth 4 (four) times daily. 60 mL 0   ondansetron (ZOFRAN) 8 MG tablet Take 1 tablet (8 mg total) by mouth every 8 (eight) hours as needed for nausea or vomiting. 60 tablet 0   pantoprazole (PROTONIX) 40 MG tablet Take 1 tablet by mouth once daily 90 tablet 0   potassium chloride (KLOR-CON M) 10 MEQ tablet Take 10 mEq by mouth daily. Patient stated stated that she was started on 20 meq daily in August.     potassium chloride (MICRO-K) 10 MEQ CR capsule Take 2 capsules (20 mEq total) by mouth 2 (two) times daily. 120 capsule 2   prochlorperazine (COMPAZINE) 10  MG tablet Take 1 tablet (10 mg total) by mouth every 6 (six) hours as needed for nausea or vomiting. 60 tablet 0   sertraline (ZOLOFT) 100 MG tablet Take 100 mg by mouth daily.     sucralfate (CARAFATE) 1 g tablet Dissolve 1 tablet in 10 mL H20 and swallow 30 min prior to meals and bedtime. 40 tablet 3   No  current facility-administered medications for this visit.      PHYSICAL EXAMINATION: ECOG PERFORMANCE STATUS: 1 - Symptomatic but completely ambulatory  Vitals:   11/29/21 1354  BP: (!) 143/88  Pulse: (!) 107  Temp: 97.7 F (36.5 C)  SpO2: 96%   Filed Weights   11/29/21 1354  Weight: 200 lb 1.6 oz (90.8 kg)   Physical Exam Constitutional:      Appearance: Normal appearance.  Cardiovascular:     Rate and Rhythm: Normal rate and regular rhythm.     Pulses: Normal pulses.     Heart sounds: Normal heart sounds.  Pulmonary:     Effort: Pulmonary effort is normal.     Breath sounds: Normal breath sounds.  Abdominal:     General: Abdomen is flat.     Palpations: Abdomen is soft.  Musculoskeletal:     Cervical back: Normal range of motion and neck supple. No rigidity.  Lymphadenopathy:     Cervical: No cervical adenopathy.  Skin:    General: Skin is warm and dry.  Neurological:     General: No focal deficit present.     Mental Status: She is alert.     LABORATORY DATA:  I have reviewed the data as listed    Latest Ref Rng & Units 11/29/2021   12:49 PM 11/22/2021    2:46 PM 11/15/2021    2:29 PM  CBC  WBC 4.0 - 10.5 K/uL 2.5   3.1   3.0    Hemoglobin 12.0 - 15.0 g/dL 25.3   66.4   40.3    Hematocrit 36.0 - 46.0 % 32.8   36.2   37.2    Platelets 150 - 400 K/uL 56   132   174         Latest Ref Rng & Units 11/29/2021   12:49 PM 11/22/2021    2:46 PM 11/15/2021    2:29 PM  CMP  Glucose 70 - 99 mg/dL 474   259   563    BUN 8 - 23 mg/dL 21   17   18     Creatinine 0.44 - 1.00 mg/dL 8.75   6.43   3.29    Sodium 135 - 145 mmol/L 140   140   141    Potassium 3.5 - 5.1 mmol/L 3.7   4.1   4.9    Chloride 98 - 111 mmol/L 101   102   103    CO2 22 - 32 mmol/L 34   33   34    Calcium 8.9 - 10.3 mg/dL 9.9   51.8   9.7    Total Protein 6.5 - 8.1 g/dL 7.4   7.1   7.1    Total Bilirubin 0.3 - 1.2 mg/dL 2.6   1.6   1.5    Alkaline Phos 38 - 126 U/L 76   70   77    AST 15 - 41  U/L 13   10   10     ALT 0 - 44 U/L 11   8   6  RADIOGRAPHIC STUDIES: I have personally reviewed the radiological images as listed and agreed with the findings in the report. IR IMAGING GUIDED PORT INSERTION  Result Date: 11/08/2021 CLINICAL DATA:  Lung carcinoma, history of bilateral axillary lymph node dissection, needs durable venous access for treatment regimen EXAM: TUNNELED PORT CATHETER PLACEMENT WITH ULTRASOUND AND FLUOROSCOPIC GUIDANCE FLUOROSCOPY: Radiation Exposure Index (as provided by the fluoroscopic device): 44 mGy air Kerma ANESTHESIA/SEDATION: Intravenous Fentanyl and Versed  were administered as conscious sedation during continuous monitoring of the patient's level of consciousness and physiological / cardiorespiratory status by the radiology RN, with a total moderate sedation time of 28 minutes. TECHNIQUE: The procedure, risks, benefits, and alternatives were explained to the patient. Questions regarding the procedure were encouraged and answered. The patient understands and consents to the procedure. Patency of the right IJ vein was confirmed with ultrasound with image documentation. An appropriate skin site was determined. Skin site was marked. Region was prepped using maximum barrier technique including cap and mask, sterile gown, sterile gloves, large sterile sheet, and Chlorhexidine as cutaneous antisepsis. The region was infiltrated locally with 1% lidocaine. Under real-time ultrasound guidance, the right IJ vein was accessed with a 21 gauge micropuncture needle; the needle tip within the vein was confirmed with ultrasound image documentation. The guidewire would not advance centrally. 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 The transitional dilator set was utilized through which an angled stiff glidewire was advanced through the stenotic right IJ vein and advanced into the IVC. A 5 French  angiographic catheter was placed. Vascular measurement was performed. A small incision was made on the right anterior chest wall and a subcutaneous pocket fashioned. The power-injectable port was positioned and its catheter tunneled to the right IJ dermatotomy site. The angiographic catheter was exchanged over an Amplatz wire for a peel-away sheath, through which the port catheter, which had been trimmed to the appropriate length, was advanced and positioned under fluoroscopy with its tip at the cavoatrial junction. Spot chest radiograph confirms good catheter position and no pneumothorax. The port was flushed per protocol. The pocket was closed with deep interrupted and subcuticular continuous 3-0 Monocryl sutures. The incisions were covered with Dermabond then covered with a sterile dressing. The patient tolerated the procedure well. COMPLICATIONS: COMPLICATIONS None immediate IMPRESSION: Technically successful right IJ power-injectable port catheter placement. Ready for routine use. Electronically Signed   By: Corlis Leak M.D.   On: 11/08/2021 14:50    ASSESSMENT & PLAN Regina Mcmahon is a 71 y.o. female who returns for a follow up.  Malignant neoplasm of supraglottis --Thyroid US on 06/03/21 revealed a suspicious nodule located in the mid right thyroid lobe, measuring 5.0 cm in the greatest extent --Underwent laryngoscopy on 06/07/2021 which revealed a granular mass of the right glottis suggestive of glottic cancer. CT neck from 06/23/2021 showed 9 mm nodule right vocal cord. No enlarged lymph nodes were appreciated in the neck. An enlarged right paratracheal lymph node was appreciated as well, noted as suspicious for metastatic disease, as well as the right thyroid nodule measuring 39 x 34 mm.  --Proceeded with biopsy of the right vocal cord mass on 06/25/21 which revealed squamous cell carcinoma; p16 positive. --PET on 07/23/21 demonstrated the small right glottic lesion as mildly hypermetabolic and  consistent with known neoplasm. PET also demonstrated: a borderline right level 2 lymph node; left supraclavicular, mediastinal and hilar lymphadenopathy; and a hypermetabolic left lower lobe pulmonary nodule, noted  as likely a metastatic focus. No other findings for abdominal/pelvic metastatic disease or osseous metastatic disease were appreciated.  --Received definitive radiation from 08/16/2021 through 09/13/2021. 54 Gy in 20 Fx.  --Post treatment CT neck from 10/18/2021 showed resolved polypoid lesion of the larynx. No worrisome nodes in the neck. --She continues to have feeble voice, has recently seen Dr. Jenne Pane according to patient, there is no evidence of disease in the supraglottis.  She is quite pleased with this.  2. 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 --Underwent EBUS with biopsy of left lower lobe pulmonary nodule, Level 4R and 7 lymph nodes. Pathology confirmed squamous cell carcinoma of left lower lobe nodule along with level 4R and 7 level nodules --Started concurrent chemoradiation with weekly carbo/taxol on 11/01/2021. -- PD-L1 29% hence she is not a candidate for single agent immunotherapy.  We will consider adjuvant immunotherapy after completion of concurrent chemoradiation for 1 year. --She is on concurrent chemotherapy radiation with CarboTaxol.  However since her platelet count is only 56,000 on lab today, we will have to defer chemotherapy by a week.  She also understands neutropenic precautions.  She does report some  epistaxis but no other evidence of bleeding.  She understands to go to the nearest hospital with any intractable bleeding or severe headache.  3. 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 --She is due for mammogram, will plan to schedule once lung cancer therapy is complete --She does have a standing order for mammogram  4. Hypokalemia: --Secondary to diarrhea and lasix.  --Potassium level is 3.7  5. Nausea: --Sent prescription for Zofran and Compazine. Advised to avoid taking Compazine and Xanax simultaneously to avoid sedation.  She prefers Zofran because this does not make her sleepy.  6. Oral thrush: --On nystatin mouthwash    No orders of the defined types were placed in this encounter.   All questions were answered. The patient knows to call the clinic with any problems, questions or concerns.  I have spent a total of 30 minutes minutes of face-to-face and non-face-to-face time, preparing to see the patient,  performing a medically appropriate examination, counseling and educating the patient, ordering medications/tests, documenting clinical information in the electronic health record,  and care coordination.

## 2021-11-30 ENCOUNTER — Ambulatory Visit
Admission: RE | Admit: 2021-11-30 | Discharge: 2021-11-30 | Disposition: A | Discharge status: Home / Self Care | Payer: Medicare HMO | Source: Ambulatory Visit | Attending: Radiation Oncology | Admitting: Radiation Oncology

## 2021-11-30 ENCOUNTER — Other Ambulatory Visit: Payer: Self-pay | Admitting: *Deleted

## 2021-11-30 ENCOUNTER — Other Ambulatory Visit: Payer: Self-pay | Admitting: Physician Assistant

## 2021-11-30 ENCOUNTER — Other Ambulatory Visit: Payer: Self-pay | Admitting: Hematology and Oncology

## 2021-11-30 ENCOUNTER — Encounter: Payer: Self-pay | Admitting: Hematology and Oncology

## 2021-11-30 ENCOUNTER — Inpatient Hospital Stay: Payer: Medicare HMO

## 2021-11-30 ENCOUNTER — Ambulatory Visit: Payer: Medicare HMO

## 2021-11-30 ENCOUNTER — Inpatient Hospital Stay: Payer: Medicare HMO | Admitting: Hematology and Oncology

## 2021-11-30 VITALS — BP 114/63 | HR 83 | Temp 98.7°F | Resp 18

## 2021-11-30 DIAGNOSIS — C3490 Malignant neoplasm of unspecified part of unspecified bronchus or lung: Secondary | ICD-10-CM

## 2021-11-30 DIAGNOSIS — R197 Diarrhea, unspecified: Secondary | ICD-10-CM | POA: Diagnosis not present

## 2021-11-30 DIAGNOSIS — Z87891 Personal history of nicotine dependence: Secondary | ICD-10-CM | POA: Diagnosis not present

## 2021-11-30 DIAGNOSIS — E876 Hypokalemia: Secondary | ICD-10-CM | POA: Diagnosis not present

## 2021-11-30 DIAGNOSIS — C50412 Malignant neoplasm of upper-outer quadrant of left female breast: Secondary | ICD-10-CM

## 2021-11-30 DIAGNOSIS — C321 Malignant neoplasm of supraglottis: Secondary | ICD-10-CM | POA: Diagnosis not present

## 2021-11-30 DIAGNOSIS — M8580 Other specified disorders of bone density and structure, unspecified site: Secondary | ICD-10-CM

## 2021-11-30 DIAGNOSIS — Z79899 Other long term (current) drug therapy: Secondary | ICD-10-CM

## 2021-11-30 DIAGNOSIS — R918 Other nonspecific abnormal finding of lung field: Secondary | ICD-10-CM

## 2021-11-30 DIAGNOSIS — C3432 Malignant neoplasm of lower lobe, left bronchus or lung: Secondary | ICD-10-CM | POA: Diagnosis not present

## 2021-11-30 DIAGNOSIS — Z170 Estrogen receptor positive status [ER+]: Secondary | ICD-10-CM | POA: Diagnosis not present

## 2021-11-30 DIAGNOSIS — Z806 Family history of leukemia: Secondary | ICD-10-CM | POA: Diagnosis not present

## 2021-11-30 DIAGNOSIS — R5383 Other fatigue: Secondary | ICD-10-CM | POA: Diagnosis not present

## 2021-11-30 DIAGNOSIS — Z8041 Family history of malignant neoplasm of ovary: Secondary | ICD-10-CM | POA: Diagnosis not present

## 2021-11-30 DIAGNOSIS — Z9221 Personal history of antineoplastic chemotherapy: Secondary | ICD-10-CM | POA: Diagnosis not present

## 2021-11-30 DIAGNOSIS — D696 Thrombocytopenia, unspecified: Secondary | ICD-10-CM | POA: Diagnosis not present

## 2021-11-30 DIAGNOSIS — Z923 Personal history of irradiation: Secondary | ICD-10-CM | POA: Diagnosis not present

## 2021-11-30 DIAGNOSIS — B370 Candidal stomatitis: Secondary | ICD-10-CM | POA: Diagnosis not present

## 2021-11-30 DIAGNOSIS — Z8249 Family history of ischemic heart disease and other diseases of the circulatory system: Secondary | ICD-10-CM | POA: Diagnosis not present

## 2021-11-30 DIAGNOSIS — Z800 Family history of malignant neoplasm of digestive organs: Secondary | ICD-10-CM | POA: Diagnosis not present

## 2021-11-30 DIAGNOSIS — Z818 Family history of other mental and behavioral disorders: Secondary | ICD-10-CM | POA: Diagnosis not present

## 2021-11-30 DIAGNOSIS — R0602 Shortness of breath: Secondary | ICD-10-CM | POA: Diagnosis not present

## 2021-11-30 DIAGNOSIS — R110 Nausea: Secondary | ICD-10-CM | POA: Diagnosis not present

## 2021-11-30 DIAGNOSIS — D649 Anemia, unspecified: Secondary | ICD-10-CM | POA: Diagnosis not present

## 2021-11-30 DIAGNOSIS — Z5111 Encounter for antineoplastic chemotherapy: Secondary | ICD-10-CM | POA: Diagnosis not present

## 2021-11-30 DIAGNOSIS — Z833 Family history of diabetes mellitus: Secondary | ICD-10-CM | POA: Diagnosis not present

## 2021-11-30 DIAGNOSIS — R070 Pain in throat: Secondary | ICD-10-CM | POA: Diagnosis not present

## 2021-11-30 LAB — CBC WITH DIFFERENTIAL (CANCER CENTER ONLY)
Abs Immature Granulocytes: 0.02 10*3/uL (ref 0.00–0.07)
Basophils Absolute: 0 10*3/uL (ref 0.0–0.1)
Basophils Relative: 0 %
Eosinophils Absolute: 0 10*3/uL (ref 0.0–0.5)
Eosinophils Relative: 0 %
HCT: 31.4 % — ABNORMAL LOW (ref 36.0–46.0)
Hemoglobin: 11.1 g/dL — ABNORMAL LOW (ref 12.0–15.0)
Immature Granulocytes: 1 %
Lymphocytes Relative: 9 %
Lymphs Abs: 0.2 10*3/uL — ABNORMAL LOW (ref 0.7–4.0)
MCH: 30.4 pg (ref 26.0–34.0)
MCHC: 35.4 g/dL (ref 30.0–36.0)
MCV: 86 fL (ref 80.0–100.0)
Monocytes Absolute: 0.2 10*3/uL (ref 0.1–1.0)
Monocytes Relative: 7 %
Neutro Abs: 2.1 10*3/uL (ref 1.7–7.7)
Neutrophils Relative %: 83 %
Platelet Count: 52 10*3/uL — ABNORMAL LOW (ref 150–400)
RBC: 3.65 MIL/uL — ABNORMAL LOW (ref 3.87–5.11)
RDW: 13.6 % (ref 11.5–15.5)
WBC Count: 2.6 10*3/uL — ABNORMAL LOW (ref 4.0–10.5)
nRBC: 0 % (ref 0.0–0.2)

## 2021-11-30 MED ORDER — SODIUM CHLORIDE 0.9 % IV SOLN: Freq: Once | INTRAVENOUS | Status: AC

## 2021-11-30 MED ORDER — SODIUM CHLORIDE 0.9% FLUSH
10.0000 mL | Freq: Once | INTRAVENOUS | Status: AC | PRN
  Administered 2021-11-30: 10 mL

## 2021-11-30 MED ORDER — HEPARIN SOD (PORK) LOCK FLUSH 100 UNIT/ML IV SOLN
500.0000 [IU] | Freq: Once | INTRAVENOUS | Status: AC | PRN
  Administered 2021-11-30: 500 [IU]

## 2021-11-30 NOTE — Patient Instructions (Signed)
Rehydration, Adult Rehydration is the replacement of body fluids, salts, and minerals (electrolytes) that are lost during dehydration. Dehydration is when there is not enough water or other fluids in the body. This happens when you lose more fluids than you take in. Common causes of dehydration include: Not drinking enough fluids. This can occur when you are ill or doing activities that require a lot of energy, especially in hot weather. Conditions that cause loss of water or other fluids, such as diarrhea, vomiting, sweating, or urinating a lot. Other illnesses, such as fever or infection. Certain medicines, such as those that remove excess fluid from the body (diuretics). Symptoms of mild or moderate dehydration may include thirst, dry lips and mouth, and dizziness. Symptoms of severe dehydration may include increased heart rate, confusion, fainting, and not urinating. For severe dehydration, you may need to get fluids through an IV at the hospital. For mild or moderate dehydration, you can usually rehydrate at home by drinking certain fluids as told by your health care provider. What are the risks? Generally, rehydration is safe. However, taking in too much fluid (overhydration) can be a problem. This is rare. Overhydration can cause an electrolyte imbalance, kidney failure, or a decrease in salt (sodium) levels in the body. Supplies needed You will need an oral rehydration solution (ORS) if your health care provider tells you to use one. This is a drink to treat dehydration. It can be found in pharmacies and retail stores. How to rehydrate Fluids Follow instructions from your health care provider for rehydration. The kind of fluid and the amount you should drink depend on your condition. In general, you should choose drinks that you prefer. If told by your health care provider, drink an ORS. Make an ORS by following instructions on the package. Start by drinking small amounts, about  cup (120  mL) every 5-10 minutes. Slowly increase how much you drink until you have taken the amount recommended by your health care provider. Drink enough clear fluids to keep your urine pale yellow. If you were told to drink an ORS, finish it first, then start slowly drinking other clear fluids. Drink fluids such as: Water. This includes sparkling water and flavored water. Drinking only water can lead to having too little sodium in your body (hyponatremia). Follow the advice of your health care provider. Water from ice chips you suck on. Fruit juice with water you add to it (diluted). Sports drinks. Hot or cold herbal teas. Broth-based soups. Milk or milk products. Food Follow instructions from your health care provider about what to eat while you rehydrate. Your health care provider may recommend that you slowly begin eating regular foods in small amounts. Eat foods that contain a healthy balance of electrolytes, such as bananas, oranges, potatoes, tomatoes, and spinach. Avoid foods that are greasy or contain a lot of sugar. In some cases, you may get nutrition through a feeding tube that is passed through your nose and into your stomach (nasogastric tube, or NG tube). This may be done if you have uncontrolled vomiting or diarrhea. Beverages to avoid  Certain beverages may make dehydration worse. While you rehydrate, avoid drinking alcohol. How to tell if you are recovering from dehydration You may be recovering from dehydration if: You are urinating more often than before you started rehydrating. Your urine is pale yellow. Your energy level improves. You vomit less frequently. You have diarrhea less frequently. Your appetite improves or returns to normal. You feel less dizzy or less light-headed.   Your skin tone and color start to look more normal. Follow these instructions at home: Take over-the-counter and prescription medicines only as told by your health care provider. Do not take sodium  tablets. Doing this can lead to having too much sodium in your body (hypernatremia). Contact a health care provider if: You continue to have symptoms of mild or moderate dehydration, such as: Thirst. Dry lips. Slightly dry mouth. Dizziness. Dark urine or less urine than normal. Muscle cramps. You continue to vomit or have diarrhea. Get help right away if you: Have symptoms of dehydration that get worse. Have a fever. Have a severe headache. Have been vomiting and the following happens: Your vomiting gets worse or does not go away. Your vomit includes blood or green matter (bile). You cannot eat or drink without vomiting. Have problems with urination or bowel movements, such as: Diarrhea that gets worse or does not go away. Blood in your stool (feces). This may cause stool to look black and tarry. Not urinating, or urinating only a small amount of very dark urine, within 6-8 hours. Have trouble breathing. Have symptoms that get worse with treatment. These symptoms may represent a serious problem that is an emergency. Do not wait to see if the symptoms will go away. Get medical help right away. Call your local emergency services (911 in the [place] Do not drive yourself to the hospital. Summary Rehydration is the replacement of body fluids and minerals (electrolytes) that are lost during dehydration. Follow instructions from your health care provider for rehydration. The kind of fluid and amount you should drink depend on your condition. Slowly increase how much you drink until you have taken the amount recommended by your health care provider. Contact your health care provider if you continue to show signs of mild or moderate dehydration. This information is not intended to replace advice given to you by your health care provider. Make sure you discuss any questions you have with your health care provider. Document Revised: 09/11/2019 Document Reviewed: 07/22/2019 Else[employer] Patient  Education  2023 [employer]  

## 2021-12-01 ENCOUNTER — Ambulatory Visit
Admission: RE | Admit: 2021-12-01 | Discharge: 2021-12-01 | Disposition: A | Discharge status: Home / Self Care | Payer: Medicare HMO | Source: Ambulatory Visit | Attending: Radiation Oncology | Admitting: Radiation Oncology

## 2021-12-01 ENCOUNTER — Other Ambulatory Visit: Payer: Self-pay | Admitting: *Deleted

## 2021-12-01 ENCOUNTER — Other Ambulatory Visit: Payer: Self-pay

## 2021-12-01 ENCOUNTER — Telehealth: Payer: Self-pay | Admitting: *Deleted

## 2021-12-01 ENCOUNTER — Other Ambulatory Visit: Payer: Self-pay | Admitting: Hematology and Oncology

## 2021-12-01 ENCOUNTER — Inpatient Hospital Stay: Payer: Medicare HMO

## 2021-12-01 ENCOUNTER — Encounter: Payer: Self-pay | Admitting: Hematology and Oncology

## 2021-12-01 VITALS — BP 112/72 | HR 85 | Temp 98.6°F | Resp 17

## 2021-12-01 DIAGNOSIS — Z79899 Other long term (current) drug therapy: Secondary | ICD-10-CM | POA: Diagnosis not present

## 2021-12-01 DIAGNOSIS — Z8249 Family history of ischemic heart disease and other diseases of the circulatory system: Secondary | ICD-10-CM | POA: Diagnosis not present

## 2021-12-01 DIAGNOSIS — Z8041 Family history of malignant neoplasm of ovary: Secondary | ICD-10-CM | POA: Diagnosis not present

## 2021-12-01 DIAGNOSIS — M8580 Other specified disorders of bone density and structure, unspecified site: Secondary | ICD-10-CM

## 2021-12-01 DIAGNOSIS — Z833 Family history of diabetes mellitus: Secondary | ICD-10-CM | POA: Diagnosis not present

## 2021-12-01 DIAGNOSIS — C50412 Malignant neoplasm of upper-outer quadrant of left female breast: Secondary | ICD-10-CM | POA: Diagnosis not present

## 2021-12-01 DIAGNOSIS — C320 Malignant neoplasm of glottis: Secondary | ICD-10-CM | POA: Diagnosis not present

## 2021-12-01 DIAGNOSIS — Z800 Family history of malignant neoplasm of digestive organs: Secondary | ICD-10-CM | POA: Diagnosis not present

## 2021-12-01 DIAGNOSIS — C3432 Malignant neoplasm of lower lobe, left bronchus or lung: Secondary | ICD-10-CM | POA: Diagnosis not present

## 2021-12-01 DIAGNOSIS — C321 Malignant neoplasm of supraglottis: Secondary | ICD-10-CM | POA: Diagnosis not present

## 2021-12-01 DIAGNOSIS — R110 Nausea: Secondary | ICD-10-CM | POA: Diagnosis not present

## 2021-12-01 DIAGNOSIS — B370 Candidal stomatitis: Secondary | ICD-10-CM | POA: Diagnosis not present

## 2021-12-01 DIAGNOSIS — Z510 Encounter for antineoplastic radiation therapy: Secondary | ICD-10-CM | POA: Diagnosis not present

## 2021-12-01 DIAGNOSIS — Z9221 Personal history of antineoplastic chemotherapy: Secondary | ICD-10-CM | POA: Diagnosis not present

## 2021-12-01 DIAGNOSIS — Z806 Family history of leukemia: Secondary | ICD-10-CM | POA: Diagnosis not present

## 2021-12-01 DIAGNOSIS — Z87891 Personal history of nicotine dependence: Secondary | ICD-10-CM | POA: Diagnosis not present

## 2021-12-01 DIAGNOSIS — D649 Anemia, unspecified: Secondary | ICD-10-CM | POA: Diagnosis not present

## 2021-12-01 DIAGNOSIS — D696 Thrombocytopenia, unspecified: Secondary | ICD-10-CM | POA: Diagnosis not present

## 2021-12-01 DIAGNOSIS — E876 Hypokalemia: Secondary | ICD-10-CM | POA: Diagnosis not present

## 2021-12-01 DIAGNOSIS — Z5111 Encounter for antineoplastic chemotherapy: Secondary | ICD-10-CM | POA: Diagnosis not present

## 2021-12-01 DIAGNOSIS — Z818 Family history of other mental and behavioral disorders: Secondary | ICD-10-CM | POA: Diagnosis not present

## 2021-12-01 DIAGNOSIS — R5383 Other fatigue: Secondary | ICD-10-CM | POA: Diagnosis not present

## 2021-12-01 DIAGNOSIS — R070 Pain in throat: Secondary | ICD-10-CM | POA: Diagnosis not present

## 2021-12-01 DIAGNOSIS — Z170 Estrogen receptor positive status [ER+]: Secondary | ICD-10-CM | POA: Diagnosis not present

## 2021-12-01 DIAGNOSIS — R0602 Shortness of breath: Secondary | ICD-10-CM | POA: Diagnosis not present

## 2021-12-01 DIAGNOSIS — R197 Diarrhea, unspecified: Secondary | ICD-10-CM | POA: Diagnosis not present

## 2021-12-01 DIAGNOSIS — Z923 Personal history of irradiation: Secondary | ICD-10-CM | POA: Diagnosis not present

## 2021-12-01 LAB — RAD ONC ARIA SESSION SUMMARY
Course Elapsed Days: 28
Plan Fractions Treated to Date: 20
Plan Prescribed Dose Per Fraction: 2 Gy
Plan Total Fractions Prescribed: 30
Plan Total Prescribed Dose: 60 Gy
Reference Point Dosage Given to Date: 40 Gy
Reference Point Session Dosage Given: 2 Gy
Session Number: 20

## 2021-12-01 MED ORDER — HYDROCODONE-ACETAMINOPHEN 7.5-325 MG/15ML PO SOLN: 10.0000 mL | Freq: Two times a day (BID) | ORAL | 0 refills | Status: DC | PRN

## 2021-12-01 MED ORDER — SODIUM CHLORIDE 0.9% FLUSH
10.0000 mL | Freq: Once | INTRAVENOUS | Status: AC | PRN
  Administered 2021-12-01: 10 mL

## 2021-12-01 MED ORDER — HEPARIN SOD (PORK) LOCK FLUSH 100 UNIT/ML IV SOLN
500.0000 [IU] | Freq: Once | INTRAVENOUS | Status: AC | PRN
  Administered 2021-12-01: 500 [IU]

## 2021-12-01 MED ORDER — SODIUM CHLORIDE 0.9 % IV SOLN: INTRAVENOUS | Status: DC

## 2021-12-01 NOTE — Telephone Encounter (Signed)
This RN spoke with pt regarding her and her sister's VM left this AM. ? ?Reviewed pt's concern with low plts - and therefore chemo cannot be given at this time. ?Pt should continue with radiation as schedule- it does not affect the plts. ? ?This RN discussed and validated her concerns for current symptoms which are expected with her known history of cancers and current therapy. ? 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 She is now out of pain medication. ? ?Per discussion of above this RN discussed benefit of obtaining IVF's on a routine basis for now to support her. ?This RN will ask MD for liquid pain medication. ? ?This RN also reviewed that Dr Pearlie Oyster role is primarily the radiation component- if at at radiation appt and the nurse or her have concerns that need medical - they will most often inform Dr Chryl Heck who manages she overall issues. ? ?Regina Mcmahon verbalized understanding of above. ? ?This RN spoke with charge nurse and scheduled pt for IVF today and tomorrow. ?Request for liquid pain medication given to MD. ? ?This RN called pt again to verify time of IVF's- and then also called her sister to inform her of above plan of care. ? ? ?

## 2021-12-01 NOTE — Patient Instructions (Signed)

## 2021-12-01 NOTE — Progress Notes (Signed)
Patient called with worsening throat pain. ?Likely esophagitis/mucositis from ongoing radiation. ?Will send prescription for liquid hydrocodone. ? ?Regina Mcmahon  ? ?

## 2021-12-02 ENCOUNTER — Inpatient Hospital Stay: Payer: Medicare HMO

## 2021-12-02 ENCOUNTER — Ambulatory Visit
Admission: RE | Admit: 2021-12-02 | Discharge: 2021-12-02 | Disposition: A | Discharge status: Home / Self Care | Payer: Medicare HMO | Source: Ambulatory Visit | Attending: Radiation Oncology | Admitting: Radiation Oncology

## 2021-12-02 ENCOUNTER — Other Ambulatory Visit: Payer: Self-pay

## 2021-12-02 VITALS — BP 119/79 | HR 99 | Temp 98.7°F | Resp 18

## 2021-12-02 DIAGNOSIS — M8580 Other specified disorders of bone density and structure, unspecified site: Secondary | ICD-10-CM

## 2021-12-02 DIAGNOSIS — Z170 Estrogen receptor positive status [ER+]: Secondary | ICD-10-CM | POA: Diagnosis not present

## 2021-12-02 DIAGNOSIS — D696 Thrombocytopenia, unspecified: Secondary | ICD-10-CM | POA: Diagnosis not present

## 2021-12-02 DIAGNOSIS — R0602 Shortness of breath: Secondary | ICD-10-CM | POA: Diagnosis not present

## 2021-12-02 DIAGNOSIS — R197 Diarrhea, unspecified: Secondary | ICD-10-CM | POA: Diagnosis not present

## 2021-12-02 DIAGNOSIS — Z510 Encounter for antineoplastic radiation therapy: Secondary | ICD-10-CM | POA: Diagnosis not present

## 2021-12-02 DIAGNOSIS — Z818 Family history of other mental and behavioral disorders: Secondary | ICD-10-CM | POA: Diagnosis not present

## 2021-12-02 DIAGNOSIS — Z800 Family history of malignant neoplasm of digestive organs: Secondary | ICD-10-CM | POA: Diagnosis not present

## 2021-12-02 DIAGNOSIS — Z87891 Personal history of nicotine dependence: Secondary | ICD-10-CM | POA: Diagnosis not present

## 2021-12-02 DIAGNOSIS — Z806 Family history of leukemia: Secondary | ICD-10-CM | POA: Diagnosis not present

## 2021-12-02 DIAGNOSIS — C321 Malignant neoplasm of supraglottis: Secondary | ICD-10-CM | POA: Diagnosis not present

## 2021-12-02 DIAGNOSIS — Z8041 Family history of malignant neoplasm of ovary: Secondary | ICD-10-CM | POA: Diagnosis not present

## 2021-12-02 DIAGNOSIS — C320 Malignant neoplasm of glottis: Secondary | ICD-10-CM | POA: Diagnosis not present

## 2021-12-02 DIAGNOSIS — D649 Anemia, unspecified: Secondary | ICD-10-CM | POA: Diagnosis not present

## 2021-12-02 DIAGNOSIS — Z833 Family history of diabetes mellitus: Secondary | ICD-10-CM | POA: Diagnosis not present

## 2021-12-02 DIAGNOSIS — R5383 Other fatigue: Secondary | ICD-10-CM | POA: Diagnosis not present

## 2021-12-02 DIAGNOSIS — Z5111 Encounter for antineoplastic chemotherapy: Secondary | ICD-10-CM | POA: Diagnosis not present

## 2021-12-02 DIAGNOSIS — R110 Nausea: Secondary | ICD-10-CM | POA: Diagnosis not present

## 2021-12-02 DIAGNOSIS — Z923 Personal history of irradiation: Secondary | ICD-10-CM | POA: Diagnosis not present

## 2021-12-02 DIAGNOSIS — Z8249 Family history of ischemic heart disease and other diseases of the circulatory system: Secondary | ICD-10-CM | POA: Diagnosis not present

## 2021-12-02 DIAGNOSIS — Z9221 Personal history of antineoplastic chemotherapy: Secondary | ICD-10-CM | POA: Diagnosis not present

## 2021-12-02 DIAGNOSIS — B370 Candidal stomatitis: Secondary | ICD-10-CM | POA: Diagnosis not present

## 2021-12-02 DIAGNOSIS — C3432 Malignant neoplasm of lower lobe, left bronchus or lung: Secondary | ICD-10-CM | POA: Diagnosis not present

## 2021-12-02 DIAGNOSIS — C50412 Malignant neoplasm of upper-outer quadrant of left female breast: Secondary | ICD-10-CM | POA: Diagnosis not present

## 2021-12-02 DIAGNOSIS — Z79899 Other long term (current) drug therapy: Secondary | ICD-10-CM | POA: Diagnosis not present

## 2021-12-02 DIAGNOSIS — R070 Pain in throat: Secondary | ICD-10-CM | POA: Diagnosis not present

## 2021-12-02 DIAGNOSIS — E876 Hypokalemia: Secondary | ICD-10-CM | POA: Diagnosis not present

## 2021-12-02 LAB — RAD ONC ARIA SESSION SUMMARY
Course Elapsed Days: 29
Plan Fractions Treated to Date: 21
Plan Prescribed Dose Per Fraction: 2 Gy
Plan Total Fractions Prescribed: 30
Plan Total Prescribed Dose: 60 Gy
Reference Point Dosage Given to Date: 42 Gy
Reference Point Session Dosage Given: 2 Gy
Session Number: 21

## 2021-12-02 MED ORDER — SODIUM CHLORIDE 0.9 % IV SOLN: Freq: Once | INTRAVENOUS | Status: AC

## 2021-12-02 NOTE — Patient Instructions (Signed)
Dehydration, Adult ?Dehydration is condition in which there is not enough water or other fluids in the body. This happens when a Iviona Hole loses more fluids than he or she takes in. Important body parts cannot work right without the right amount of fluids. Any loss of fluids from the body can cause dehydration. ?Dehydration can be mild, worse, or very bad. It should be treated right away to keep it from getting very bad. ?What are the causes? ?This condition may be caused by: ?Conditions that cause loss of water or other fluids, such as: ?Watery poop (diarrhea). ?Vomiting. ?Sweating a lot. ?Peeing (urinating) a lot. ?Not drinking enough fluids, especially when you: ?Are ill. ?Are doing things that take a lot of energy to do. ?Other illnesses and conditions, such as fever or infection. ?Certain medicines, such as medicines that take extra fluid out of the body (diuretics). ?Lack of safe drinking water. ?Not being able to get enough water and food. ?What increases the risk? ?The following factors may make you more likely to develop this condition: ?Having a long-term (chronic) illness that has not been treated the right way, such as: ?Diabetes. ?Heart disease. ?Kidney disease. ?Being 65 years of age or older. ?Having a disability. ?Living in a place that is high above the ground or sea (high in altitude). The thinner, dried air causes more fluid loss. ?Doing exercises that put stress on your body for a long time. ?What are the signs or symptoms? ?Symptoms of dehydration depend on how bad it is. ?Mild or worse dehydration ?Thirst. ?Dry lips or dry mouth. ?Feeling dizzy or light-headed, especially when you stand up from sitting. ?Muscle cramps. ?Your body making: ?Dark pee (urine). Pee may be the color of tea. ?Less pee than normal. ?Less tears than normal. ?Headache. ?Very bad dehydration ?Changes in skin. Skin may: ?Be cold to the touch (clammy). ?Be blotchy or pale. ?Not go back to normal right after you lightly pinch  it and let it go. ?Little or no tears, pee, or sweat. ?Changes in vital signs, such as: ?Fast breathing. ?Low blood pressure. ?Weak pulse. ?Pulse that is more than 100 beats a minute when you are sitting still. ?Other changes, such as: ?Feeling very thirsty. ?Eyes that look hollow (sunken). ?Cold hands and feet. ?Being mixed up (confused). ?Being very tired (lethargic) or having trouble waking from sleep. ?Short-term weight loss. ?Loss of consciousness. ?How is this treated? ?Treatment for this condition depends on how bad it is. Treatment should start right away. Do not wait until your condition gets very bad. Very bad dehydration is an emergency. You will need to go to a hospital. ?Mild or worse dehydration can be treated at home. You may be asked to: ?Drink more fluids. ?Drink an oral rehydration solution (ORS). This drink helps get the right amounts of fluids and salts and minerals in the blood (electrolytes). ?Very bad dehydration can be treated: ?With fluids through an IV tube. ?By getting normal levels of salts and minerals in your blood. This is often done by giving salts and minerals through a tube. The tube is passed through your nose and into your stomach. ?By treating the root cause. ?Follow these instructions at home: ?Oral rehydration solution ?If told by your doctor, drink an ORS: ?Make an ORS. Use instructions on the package. ?Start by drinking small amounts, about ? cup (120 mL) every 5-10 minutes. ?Slowly drink more until you have had the amount that your doctor said to have. ?Eating and drinking ?Drink enough clear   fluid to keep your pee pale yellow. If you were told to drink an ORS, finish the ORS first. Then, start slowly drinking other clear fluids. Drink fluids such as: ?Water. Do not drink only water. Doing that can make the salt (sodium) level in your body get too low. ?Water from ice chips you suck on. ?Fruit juice that you have added water to (diluted). ?Low-calorie sports drinks. ?Eat  foods that have the right amounts of salts and minerals, such as: ?Bananas. ?Oranges. ?Potatoes. ?Tomatoes. ?Spinach. ?Do not drink alcohol. ?Avoid: ?Drinks that have a lot of sugar. These include: ?High-calorie sports drinks. ?Fruit juice that you did not add water to. ?Soda. ?Caffeine. ?Foods that are greasy or have a lot of fat or sugar. ?General instructions ?Take over-the-counter and prescription medicines only as told by your doctor. ?Do not take salt tablets. Doing that can make the salt level in your body get too high. ?Return to your normal activities as told by your doctor. Ask your doctor what activities are safe for you. ?Keep all follow-up visits as told by your doctor. This is important. ?Contact a doctor if: ?You have pain in your belly (abdomen) and the pain: ?Gets worse. ?Stays in one place. ?You have a rash. ?You have a stiff neck. ?You get angry or annoyed (irritable) more easily than normal. ?You are more tired or have a harder time waking than normal. ?You feel: ?Weak or dizzy. ?Very thirsty. ?Get help right away if you have: ?Any symptoms of very bad dehydration. ?Symptoms of vomiting, such as: ?You cannot eat or drink without vomiting. ?Your vomiting gets worse or does not go away. ?Your vomit has blood or green stuff in it. ?Symptoms that get worse with treatment. ?A fever. ?A very bad headache. ?Problems with peeing or pooping (having a bowel movement), such as: ?Watery poop that gets worse or does not go away. ?Blood in your poop (stool). This may cause poop to look black and tarry. ?Not peeing in 6-8 hours. ?Peeing only a small amount of very dark pee in 6-8 hours. ?Trouble breathing. ?These symptoms may be an emergency. Do not wait to see if the symptoms will go away. Get medical help right away. Call your local emergency services (911 in the [place] Do not drive yourself to the hospital. ?Summary ?Dehydration is a condition in which there is not enough water or other fluids in the body.  This happens when a Regina Mcmahon loses more fluids than he or she takes in. ?Treatment for this condition depends on how bad it is. Treatment should be started right away. Do not wait until your condition gets very bad. ?Drink enough clear fluid to keep your pee pale yellow. If you were told to drink an oral rehydration solution (ORS), finish the ORS first. Then, start slowly drinking other clear fluids. ?Take over-the-counter and prescription medicines only as told by your doctor. ?Get help right away if you have any symptoms of very bad dehydration. ?This information is not intended to replace advice given to you by your health care provider. Make sure you discuss any questions you have with your health care provider. ?Document Revised: 02/21/2019 Document Reviewed: 02/21/2019 ?Elsevier Patient Education ? 2023 [employer] ? ?

## 2021-12-03 ENCOUNTER — Ambulatory Visit
Admission: RE | Admit: 2021-12-03 | Discharge: 2021-12-03 | Disposition: A | Discharge status: Home / Self Care | Payer: Medicare HMO | Source: Ambulatory Visit | Attending: Radiation Oncology | Admitting: Radiation Oncology

## 2021-12-03 ENCOUNTER — Other Ambulatory Visit: Payer: Self-pay

## 2021-12-03 DIAGNOSIS — C320 Malignant neoplasm of glottis: Secondary | ICD-10-CM | POA: Diagnosis not present

## 2021-12-03 DIAGNOSIS — Z510 Encounter for antineoplastic radiation therapy: Secondary | ICD-10-CM | POA: Diagnosis not present

## 2021-12-03 DIAGNOSIS — C3432 Malignant neoplasm of lower lobe, left bronchus or lung: Secondary | ICD-10-CM | POA: Diagnosis not present

## 2021-12-03 DIAGNOSIS — Z87891 Personal history of nicotine dependence: Secondary | ICD-10-CM | POA: Diagnosis not present

## 2021-12-03 LAB — RAD ONC ARIA SESSION SUMMARY
Course Elapsed Days: 30
Plan Fractions Treated to Date: 22
Plan Prescribed Dose Per Fraction: 2 Gy
Plan Total Fractions Prescribed: 30
Plan Total Prescribed Dose: 60 Gy
Reference Point Dosage Given to Date: 44 Gy
Reference Point Session Dosage Given: 2 Gy
Session Number: 22

## 2021-12-06 ENCOUNTER — Inpatient Hospital Stay: Payer: Medicare HMO

## 2021-12-06 ENCOUNTER — Ambulatory Visit
Admission: RE | Admit: 2021-12-06 | Discharge: 2021-12-06 | Disposition: A | Discharge status: Home / Self Care | Payer: Medicare HMO | Source: Ambulatory Visit | Attending: Radiation Oncology | Admitting: Radiation Oncology

## 2021-12-06 ENCOUNTER — Inpatient Hospital Stay (HOSPITAL_BASED_OUTPATIENT_CLINIC_OR_DEPARTMENT_OTHER): Payer: Medicare HMO | Admitting: Physician Assistant

## 2021-12-06 ENCOUNTER — Other Ambulatory Visit: Payer: Self-pay

## 2021-12-06 VITALS — BP 117/73 | HR 86 | Temp 97.9°F | Resp 18 | Wt 201.2 lb

## 2021-12-06 DIAGNOSIS — C3490 Malignant neoplasm of unspecified part of unspecified bronchus or lung: Secondary | ICD-10-CM | POA: Diagnosis not present

## 2021-12-06 DIAGNOSIS — Z923 Personal history of irradiation: Secondary | ICD-10-CM | POA: Diagnosis not present

## 2021-12-06 DIAGNOSIS — Z806 Family history of leukemia: Secondary | ICD-10-CM | POA: Diagnosis not present

## 2021-12-06 DIAGNOSIS — C320 Malignant neoplasm of glottis: Secondary | ICD-10-CM | POA: Diagnosis not present

## 2021-12-06 DIAGNOSIS — E876 Hypokalemia: Secondary | ICD-10-CM | POA: Diagnosis not present

## 2021-12-06 DIAGNOSIS — D649 Anemia, unspecified: Secondary | ICD-10-CM | POA: Diagnosis not present

## 2021-12-06 DIAGNOSIS — Z833 Family history of diabetes mellitus: Secondary | ICD-10-CM | POA: Diagnosis not present

## 2021-12-06 DIAGNOSIS — Z79899 Other long term (current) drug therapy: Secondary | ICD-10-CM | POA: Diagnosis not present

## 2021-12-06 DIAGNOSIS — Z170 Estrogen receptor positive status [ER+]: Secondary | ICD-10-CM | POA: Diagnosis not present

## 2021-12-06 DIAGNOSIS — Z800 Family history of malignant neoplasm of digestive organs: Secondary | ICD-10-CM | POA: Diagnosis not present

## 2021-12-06 DIAGNOSIS — B370 Candidal stomatitis: Secondary | ICD-10-CM | POA: Diagnosis not present

## 2021-12-06 DIAGNOSIS — R070 Pain in throat: Secondary | ICD-10-CM | POA: Diagnosis not present

## 2021-12-06 DIAGNOSIS — Z818 Family history of other mental and behavioral disorders: Secondary | ICD-10-CM | POA: Diagnosis not present

## 2021-12-06 DIAGNOSIS — Z510 Encounter for antineoplastic radiation therapy: Secondary | ICD-10-CM | POA: Diagnosis not present

## 2021-12-06 DIAGNOSIS — C50412 Malignant neoplasm of upper-outer quadrant of left female breast: Secondary | ICD-10-CM | POA: Diagnosis not present

## 2021-12-06 DIAGNOSIS — Z5111 Encounter for antineoplastic chemotherapy: Secondary | ICD-10-CM | POA: Diagnosis not present

## 2021-12-06 DIAGNOSIS — R0602 Shortness of breath: Secondary | ICD-10-CM | POA: Diagnosis not present

## 2021-12-06 DIAGNOSIS — D696 Thrombocytopenia, unspecified: Secondary | ICD-10-CM | POA: Diagnosis not present

## 2021-12-06 DIAGNOSIS — C321 Malignant neoplasm of supraglottis: Secondary | ICD-10-CM | POA: Diagnosis not present

## 2021-12-06 DIAGNOSIS — Z8041 Family history of malignant neoplasm of ovary: Secondary | ICD-10-CM | POA: Diagnosis not present

## 2021-12-06 DIAGNOSIS — R110 Nausea: Secondary | ICD-10-CM | POA: Diagnosis not present

## 2021-12-06 DIAGNOSIS — Z87891 Personal history of nicotine dependence: Secondary | ICD-10-CM | POA: Diagnosis not present

## 2021-12-06 DIAGNOSIS — R5383 Other fatigue: Secondary | ICD-10-CM | POA: Diagnosis not present

## 2021-12-06 DIAGNOSIS — C3432 Malignant neoplasm of lower lobe, left bronchus or lung: Secondary | ICD-10-CM | POA: Diagnosis not present

## 2021-12-06 DIAGNOSIS — Z8249 Family history of ischemic heart disease and other diseases of the circulatory system: Secondary | ICD-10-CM | POA: Diagnosis not present

## 2021-12-06 DIAGNOSIS — R197 Diarrhea, unspecified: Secondary | ICD-10-CM | POA: Diagnosis not present

## 2021-12-06 DIAGNOSIS — Z9221 Personal history of antineoplastic chemotherapy: Secondary | ICD-10-CM | POA: Diagnosis not present

## 2021-12-06 LAB — COMPREHENSIVE METABOLIC PANEL
ALT: 8 U/L (ref 0–44)
AST: 9 U/L — ABNORMAL LOW (ref 15–41)
Albumin: 3.8 g/dL (ref 3.5–5.0)
Alkaline Phosphatase: 73 U/L (ref 38–126)
Anion gap: 5 (ref 5–15)
BUN: 16 mg/dL (ref 8–23)
CO2: 34 mmol/L — ABNORMAL HIGH (ref 22–32)
Calcium: 9.6 mg/dL (ref 8.9–10.3)
Chloride: 100 mmol/L (ref 98–111)
Creatinine, Ser: 0.77 mg/dL (ref 0.44–1.00)
GFR, Estimated: >60 mL/min (ref >60)
Glucose, Bld: 131 mg/dL — ABNORMAL HIGH (ref 70–99)
Potassium: 3.4 mmol/L — ABNORMAL LOW (ref 3.5–5.1)
Sodium: 139 mmol/L (ref 135–145)
Total Bilirubin: 3 mg/dL — ABNORMAL HIGH (ref 0.3–1.2)
Total Protein: 6.6 g/dL (ref 6.5–8.1)

## 2021-12-06 LAB — RAD ONC ARIA SESSION SUMMARY
Course Elapsed Days: 33
Plan Fractions Treated to Date: 23
Plan Prescribed Dose Per Fraction: 2 Gy
Plan Total Fractions Prescribed: 30
Plan Total Prescribed Dose: 60 Gy
Reference Point Dosage Given to Date: 46 Gy
Reference Point Session Dosage Given: 2 Gy
Session Number: 23

## 2021-12-06 LAB — CBC WITH DIFFERENTIAL/PLATELET
Abs Immature Granulocytes: 0.01 10*3/uL (ref 0.00–0.07)
Basophils Absolute: 0 10*3/uL (ref 0.0–0.1)
Basophils Relative: 0 %
Eosinophils Absolute: 0 10*3/uL (ref 0.0–0.5)
Eosinophils Relative: 1 %
HCT: 29.2 % — ABNORMAL LOW (ref 36.0–46.0)
Hemoglobin: 10.5 g/dL — ABNORMAL LOW (ref 12.0–15.0)
Immature Granulocytes: 0 %
Lymphocytes Relative: 12 %
Lymphs Abs: 0.3 10*3/uL — ABNORMAL LOW (ref 0.7–4.0)
MCH: 31.8 pg (ref 26.0–34.0)
MCHC: 36 g/dL (ref 30.0–36.0)
MCV: 88.5 fL (ref 80.0–100.0)
Monocytes Absolute: 0.2 10*3/uL (ref 0.1–1.0)
Monocytes Relative: 10 %
Neutro Abs: 1.8 10*3/uL (ref 1.7–7.7)
Neutrophils Relative %: 77 %
Platelets: 54 10*3/uL — ABNORMAL LOW (ref 150–400)
RBC: 3.3 MIL/uL — ABNORMAL LOW (ref 3.87–5.11)
RDW: 16.5 % — ABNORMAL HIGH (ref 11.5–15.5)
WBC: 2.4 10*3/uL — ABNORMAL LOW (ref 4.0–10.5)
nRBC: 0 % (ref 0.0–0.2)

## 2021-12-06 NOTE — Progress Notes (Signed)
[facility] Telephone:([phone]) [phone]   [phone]  PROGRESS NOTE  Patient Care Team: Benita Stabile, MD as PCP - General (Internal Medicine) Laqueta Linden, MD (Inactive) as PCP - Cardiology (Cardiology) Galen Manila, Novella Olive, MD (Inactive) as Consulting Physician (Hematology and Oncology) Harriette Bouillon, MD as Consulting Physician (General Surgery) Lurline Hare, MD as Consulting Physician (Radiation Oncology) Salomon Fick, NP as Nurse Practitioner (Hematology and Oncology) West Bali, MD (Inactive) as Consulting Physician (Gastroenterology) Malmfelt, Lise Auer, RN as Oncology Nurse Navigator Christia Reading, MD as Consulting Physician (Otolaryngology) Lonie Peak, MD as Consulting Physician (Radiation Oncology) [name], MD as Consulting Physician (Hematology and Oncology)  CHIEF COMPLAINTS/PURPOSE OF CONSULTATION:  1.Malignant neoplasm of glottis/supraglottis  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 3. 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    06/24/2014 Breast US   Left breast: ill-defined predominantly hypoechoic area with some increased echogenicity in the 2 o'clock position of the left breast, 7 cm from the nipple. 2.6 x 2.3 x 1.8 cm in maximum dimensions.   07/01/2014 Initial Biopsy   Left breast core needle bx: Invasive mammary carcinoma with lobular features, ER+ (100%), PR+ (91%), HER2/neu negative (ratio 1.09), Ki-67 32%. E-cadherin strongly positive, diagnostic for IDC    07/01/2014 Clinical Stage   Stage IIA: T2 N0   08/20/2014 Definitive Surgery   Left breast seed localized lumpectomy/SLNB: IDC, +LVI, DCIS, 1 LN removed and  positive for metastatic carcinoma. Grade 2. HER2/neu repeated and negative (ratio 0.69-1.9).    08/20/2014 Pathologic Stage   Stage IIB: pT2 pN1a pMx   09/17/2014 Surgery   Port-a cath placement and re-excision   10/02/2014 - 01/15/2015 Chemotherapy   CMF x 6 cycles.  patient refused Taxane-containing chemotherapy.   03/07/2015 Procedure   Comp Cancer Panel reveals no variant at APC, ATM, AXIN2, BARD1, BMPR1A, BRCA1, BRCA2, BRIP1, CDH1, CDK4, CDKN2A, CHEK2, FANCC, MLH1, MSH2, MSH6, MUTYH, NBN, PALB2, PMS2, POLD1, POLE, PTEN, RAD51C, RAD51D, SMAD4, STK11, TP53, VHL, and XRCC2.    04/06/2015 - 05/21/2015 Radiation Therapy   Adjuvant RT Michell Heinrich): Left breast/ 45 Gy over 25 fractions.   Left supraclavicular fossa and axilla/ 45 Gy over 25 fractions. Left breast boost/ 16 Gy over 8 fractions. Total dose: 61 Gy   06/04/2015 - 08/07/2015 Anti-estrogen oral therapy   Exemestane 25 mg daily.  Planned duration of therapy 5 years.    07/23/2015 Survivorship   Survivorship care plan completed and mailed to patient in lieu of in person visit   08/06/2015 Adverse Reaction   Severe hot flashes and mood swings   08/08/2015 - 12/07/2015 Anti-estrogen oral therapy   Arimidex   12/04/2015 Adverse Reaction   Worsening depression, patient stopped Arimidex   12/07/2015 -  Anti-estrogen oral therapy   Tamoxifen   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HCC)  07/30/2021 Initial Diagnosis   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HCC) P 16 positive cT1N1   08/16/2021 - 09/13/2021 Radiation Therapy   Completed definitive radiation   09/30/2021 Pathology Results   [facility] 360 Pathology PDL 1 29%   Squamous cell lung cancer (HCC)  07/23/2021 Imaging    PET/CT  demonstrated small right glottic lesion is mildly  hypermetabolic and consistent with no neoplasm.  PET also demonstrated borderline right level 2 lymph node left supraclavicular, mediastinal and hilar lymphadenopathy and hypermetabolic left lower lobe pulmonary  nodule all suspicious of metastatic focus.  No abdominal pelvic metastatic disease or osseous metastatic disease was appreciated.   08/31/2021 Initial Diagnosis   Squamous cell carcinoma of lung (HCC)   09/08/2021 Cancer Staging   Staging form: Lung, AJCC 8th Edition - Clinical stage from 09/08/2021: Stage IIIB (cT1b, cN3, cM0) - Signed by Lonie Peak, MD on 09/08/2021 Stage prefix: Initial diagnosis   09/30/2021 Pathology Results   [facility] 360 Pathology PDL 1 29%   11/01/2021 -  Chemotherapy   Patient is on Treatment Plan : LUNG Carboplatin / Paclitaxel + XRT q7d       INTERIM HISTORY: Regina Mcmahon 71 y.o. female returns for a follow up prior to C5D1 of weekly carbo/taxol given concurrent with radiation for squamous  cell carcinoma of the lung. She is accompanied by her sister today.  She is noticing persistent fatigue and requires frequent resting.  She reports swelling in her throat and occasional difficulty with swallowing her pills.  Her throat pain is well controlled with liquid hydrocodone.  She reports occasional episodes of dysphagia but no evidence of regurgitation.  She denies nausea, vomiting or abdominal pain.  Her bowel habits are unchanged without any recurrent episodes of diarrhea or constipation.  She reports intermittent episodes of epistaxis that resolves on its own and mainly triggered when she blows her nose.  She has noticed thrush on her tongue in the morning.  She is compliant with using the nystatin mouthwash.  She denies any fevers, chills, night sweats, shortness of breath, chest pain or cough. Rest of the pertinent 10 point ROS reviewed and negative.  MEDICAL HISTORY:  Past Medical History:  Diagnosis Date   Arthritis    Asthma    Asymptomatic varicose veins    Breast cancer (HCC) 06/2014   left   Cancer (HCC)    Supra Glottis   Chronic airway obstruction (HCC)    Complication of anesthesia 2016   difficulty waking up, Oxygen dropped low.   COPD (chronic  obstructive pulmonary disease) (HCC)    Depression    Dyspnea    GERD (gastroesophageal reflux disease)    Hemorrhage rectum    and anus.   Hypertension    Insomnia    Other symptoms involving digestive system(787.99)    Pain    Hx.pain in joint involving pelvic region and thigh; pain in limb   Palpitations    Panic attacks    Personal history of chemotherapy 2016   Personal history of radiation therapy 2016   Pneumonia    Pre-diabetes    Prediabetes    Sinusitis    Symptomatic menopausal or female climacteric states    Varicose veins of both lower extremities with pain     SURGICAL HISTORY: Past Surgical History:  Procedure Laterality Date   BREAST BIOPSY Left 10/2012   BREAST BIOPSY Left 07/01/2014   malignant   BREAST LUMPECTOMY Left 08/20/2014   BRONCHIAL BIOPSY  08/23/2021   Procedure: BRONCHIAL BIOPSIES;  Surgeon: Leslye Peer, MD;  Location: [facility] ENDOSCOPY;  Service: Pulmonary;;   BRONCHIAL BRUSHINGS  08/23/2021   Procedure: BRONCHIAL BRUSHINGS;  Surgeon: Leslye Peer, MD;  Location: [facility] ENDOSCOPY;  Service: Pulmonary;;   BRONCHIAL NEEDLE ASPIRATION BIOPSY  08/23/2021   Procedure: BRONCHIAL NEEDLE ASPIRATION BIOPSIES;  Surgeon: Leslye Peer, MD;  Location:  [facility];  Service: Pulmonary;;   BRONCHIAL WASHINGS  08/23/2021   Procedure: BRONCHIAL WASHINGS;  Surgeon: Leslye Peer, MD;  Location: [facility] ENDOSCOPY;  Service: Pulmonary;;   CESAREAN SECTION     CHOLECYSTECTOMY  1985   COLONOSCOPY  2002   Dr. Cleotis Nipper: internal hemorrhoids, one small rectal polyp, adenomatous path    COLONOSCOPY N/A 11/23/2015   Procedure: COLONOSCOPY;  Surgeon: West Bali, MD;  Location: [facility];  Service: Endoscopy;  Laterality: N/A;  1100 - moved to 10:45 - office to notify   ESOPHAGOGASTRODUODENOSCOPY N/A 11/23/2015   Procedure: ESOPHAGOGASTRODUODENOSCOPY (EGD);  Surgeon: West Bali, MD;  Location: [facility];  Service: Endoscopy;  Laterality: N/A;   IR IMAGING GUIDED  PORT INSERTION  11/08/2021   PORT-A-CATH REMOVAL  02/2015   PORTACATH PLACEMENT Right 09/17/2014   Procedure: INSERTION PORT-A-CATH WITH ULTRASOUND;  Surgeon: Harriette Bouillon, MD;  Location: [facility];  Service: General;  Laterality: Right;  IJ   RADIOACTIVE SEED GUIDED PARTIAL MASTECTOMY WITH AXILLARY SENTINEL LYMPH NODE BIOPSY Left 08/20/2014   Procedure: LEFT BREAST LUMPECTOMY WITH RADIOACTIVE SEED LOCALIZATION AND SENTINEL LYMPH NODE MAPPING;  Surgeon: Harriette Bouillon, MD;  Location: [facility];  Service: General;  Laterality: Left;   RE-EXCISION OF BREAST LUMPECTOMY Left 09/17/2014   Procedure: RE-EXCISION OF BREAST LUMPECTOMY;  Surgeon: Harriette Bouillon, MD;  Location: [facility];  Service: General;  Laterality: Left;   TOTAL HIP ARTHROPLASTY Right 02/22/2021   Procedure: RIGHT TOTAL HIP ARTHROPLASTY ANTERIOR APPROACH;  Surgeon: Eldred Manges, MD;  Location: [facility] OR;  Service: Orthopedics;  Laterality: Right;   TUBAL LIGATION     VIDEO BRONCHOSCOPY WITH ENDOBRONCHIAL ULTRASOUND N/A 08/23/2021   Procedure: VIDEO BRONCHOSCOPY WITH ENDOBRONCHIAL ULTRASOUND;  Surgeon: Leslye Peer, MD;  Location: [facility];  Service: Pulmonary;  Laterality: N/A;   VIDEO BRONCHOSCOPY WITH RADIAL ENDOBRONCHIAL ULTRASOUND  08/23/2021   Procedure: VIDEO BRONCHOSCOPY WITH RADIAL ENDOBRONCHIAL ULTRASOUND;  Surgeon: Leslye Peer, MD;  Location: [facility];  Service: Pulmonary;;    SOCIAL HISTORY: Social History   Socioeconomic History   Marital status: Divorced    Spouse name: Not on file   Number of children: 2   Years of education: Not on file   Highest education level: Not on file  Occupational History   Not on file  Tobacco Use   Smoking status: Former    Packs/day: 1.00    Years: 42.00    Pack years: 42.00    Types: Cigarettes    Quit date: 02/22/2021    Years since quitting: 0.7   Smokeless tobacco: Never  Vaping Use   Vaping Use: Never used  Substance  and Sexual Activity   Alcohol use: No    Alcohol/week: 0.0 standard drinks   Drug use: No   Sexual activity: Never    Birth control/protection: None  Other Topics Concern   Not on file  Social History Narrative   Not on file   Social Determinants of Health   Financial Resource Strain: Not on file  Food Insecurity: Not on file  Transportation Needs: Not on file  Physical Activity: Not on file  Stress: Not on file  Social Connections: Unknown   Frequency of Communication with Friends and Family: More than three times a week   Frequency of Social Gatherings with Friends and Family: More than three times a week   Attends Religious Services: Not on [employer] of Clubs or  Organizations: Not on file   Attends Banker Meetings: Not on file   Marital Status: Not on file  Intimate Partner Violence: Not on file    FAMILY HISTORY: Family History  Problem Relation Age of Onset   Diabetes Mother    Ovarian cancer Mother 46   Other Father        died in MVA at age 96   Other Sister        mitral valve disorder   Melanoma Sister 74       removed from back of leg   Colon cancer Brother        early 28s, succumbed to disease   Cancer Paternal Aunt        leukemia, bone, or lung cancer   Alzheimer's disease Maternal Grandmother    Heart attack Maternal Grandfather    Heart attack Paternal Grandmother    COPD Paternal Grandfather    Emphysema Paternal Grandfather    Congestive Heart Failure Paternal Aunt    Stomach cancer Paternal Uncle    Kidney cancer Maternal Uncle    Cancer Cousin        dx. teens/dx. 40s   Cancer Cousin    Colon cancer Cousin     ALLERGIES:  has No Known Allergies.  MEDICATIONS:  Current Outpatient Medications  Medication Sig Dispense Refill   albuterol (PROVENTIL HFA;VENTOLIN HFA) 108 (90 BASE) MCG/ACT inhaler Inhale 2 puffs into the lungs every 6 (six) hours as needed for wheezing or shortness of breath.      ALPRAZolam (XANAX)  0.5 MG tablet Take 0.5 mg by mouth 3 (three) times daily.     ANORO ELLIPTA 62.5-25 MCG/INH AEPB Inhale 1 puff into the lungs daily.     aspirin EC 325 MG EC tablet Take 1 tablet (325 mg total) by mouth daily with breakfast. Must take at least 4 weeks postop for DVT prophylaxis 30 tablet 0   calcium carbonate (TUMS - DOSED IN MG ELEMENTAL CALCIUM) 500 MG chewable tablet Chew 2 tablets by mouth 3 (three) times daily as needed for indigestion or heartburn.     fluconazole (DIFLUCAN) 100 MG tablet Take 2 tablets today, then 1 tablet daily x 20 more days. 22 tablet 0   furosemide (LASIX) 20 MG tablet Take 20 mg by mouth daily.     hydrochlorothiazide (HYDRODIURIL) 12.5 MG tablet Take 12.5 mg by mouth daily.     HYDROcodone-acetaminophen (HYCET) 7.5-325 mg/15 ml solution Take 10 mLs by mouth every 12 (twelve) hours as needed for moderate pain. 473 mL 0   ibuprofen (ADVIL) 800 MG tablet Take 800 mg by mouth every 8 (eight) hours as needed for moderate pain.     lidocaine (XYLOCAINE) 2 % solution Patient: Mix 1part 2% viscous lidocaine, 1part H20. Swallow 10mL of diluted mixture, before meals and at bedtime, up to QID for sore throat. 200 mL 3   lidocaine-prilocaine (EMLA) cream Apply 1 application. topically as needed. 30 g 0   Menthol, Topical Analgesic, (BIOFREEZE EX) Apply 1 application topically daily as needed (pain).     nystatin (MYCOSTATIN) 100000 UNIT/ML suspension Take 5 mLs (500,000 Units total) by mouth 4 (four) times daily. 60 mL 0   ondansetron (ZOFRAN) 8 MG tablet Take 1 tablet (8 mg total) by mouth every 8 (eight) hours as needed for nausea or vomiting. 60 tablet 0   pantoprazole (PROTONIX) 40 MG tablet Take 1 tablet by mouth once daily 90 tablet 0   potassium  chloride (KLOR-CON M) 10 MEQ tablet Take 10 mEq by mouth daily. Patient stated stated that she was started on 20 meq daily in August.     potassium chloride (MICRO-K) 10 MEQ CR capsule Take 2 capsules (20 mEq total) by mouth 2  (two) times daily. 120 capsule 2   prochlorperazine (COMPAZINE) 10 MG tablet Take 1 tablet (10 mg total) by mouth every 6 (six) hours as needed for nausea or vomiting. 60 tablet 0   sertraline (ZOLOFT) 100 MG tablet Take 100 mg by mouth daily.     sucralfate (CARAFATE) 1 g tablet Dissolve 1 tablet in 10 mL H20 and swallow 30 min prior to meals and bedtime. 40 tablet 3   No current facility-administered medications for this visit.      PHYSICAL EXAMINATION: ECOG PERFORMANCE STATUS: 1 - Symptomatic but completely ambulatory  Vitals:   12/06/21 1210  BP: 117/73  Pulse: 86  Resp: 18  Temp: 97.9 F (36.6 C)  SpO2: 93%   Filed Weights   12/06/21 1210  Weight: 201 lb 3.2 oz (91.3 kg)   Physical Exam Constitutional:      Appearance: Normal appearance.  Cardiovascular:     Rate and Rhythm: Normal rate and regular rhythm.     Pulses: Normal pulses.     Heart sounds: Normal heart sounds.  Pulmonary:     Effort: Pulmonary effort is normal.     Breath sounds: Normal breath sounds.  Abdominal:     General: Abdomen is flat.     Palpations: Abdomen is soft.  Musculoskeletal:     Cervical back: Normal range of motion and neck supple. No rigidity.  Lymphadenopathy:     Cervical: No cervical adenopathy.  Skin:    General: Skin is warm and dry.  Neurological:     General: No focal deficit present.     Mental Status: She is alert.     LABORATORY DATA:  I have reviewed the data as listed    Latest Ref Rng & Units 12/06/2021   11:46 AM 11/30/2021   12:08 PM 11/29/2021   12:49 PM  CBC  WBC 4.0 - 10.5 K/uL 2.4   2.6   2.5    Hemoglobin 12.0 - 15.0 g/dL 40.9   81.1   91.4    Hematocrit 36.0 - 46.0 % 29.2   31.4   32.8    Platelets 150 - 400 K/uL 54   52   56         Latest Ref Rng & Units 12/06/2021   11:46 AM 11/29/2021   12:49 PM 11/22/2021    2:46 PM  CMP  Glucose 70 - 99 mg/dL 782   956   213    BUN 8 - 23 mg/dL 16   21   17     Creatinine 0.44 - 1.00 mg/dL 0.86   5.78   4.69     Sodium 135 - 145 mmol/L 139   140   140    Potassium 3.5 - 5.1 mmol/L 3.4   3.7   4.1    Chloride 98 - 111 mmol/L 100   101   102    CO2 22 - 32 mmol/L 34   34   33    Calcium 8.9 - 10.3 mg/dL 9.6   9.9   62.9    Total Protein 6.5 - 8.1 g/dL 6.6   7.4   7.1    Total Bilirubin 0.3 - 1.2 mg/dL 3.0  2.6   1.6    Alkaline Phos 38 - 126 U/L 73   76   70    AST 15 - 41 U/L ALT 0 - 44 U/L RADIOGRAPHIC STUDIES: I have personally reviewed the radiological images as listed and agreed with the findings in the report. IR IMAGING GUIDED PORT INSERTION  Result Date: 11/08/2021 CLINICAL DATA:  Lung carcinoma, history of bilateral axillary lymph node dissection, needs durable venous access for treatment regimen EXAM: TUNNELED PORT CATHETER PLACEMENT WITH ULTRASOUND AND FLUOROSCOPIC GUIDANCE FLUOROSCOPY: Radiation Exposure Index (as provided by the fluoroscopic device): 44 mGy air Kerma ANESTHESIA/SEDATION: Intravenous Fentanyl and Versed  were administered as conscious sedation during continuous monitoring of the patient's level of consciousness and physiological / cardiorespiratory status by the radiology RN, with a total moderate sedation time of 28 minutes. TECHNIQUE: The procedure, risks, benefits, and alternatives were explained to the patient. Questions regarding the procedure were encouraged and answered. The patient understands and consents to the procedure. Patency of the right IJ vein was confirmed with ultrasound with image documentation. An appropriate skin site was determined. Skin site was marked. Region was prepped using maximum barrier technique including cap and mask, sterile gown, sterile gloves, large sterile sheet, and Chlorhexidine as cutaneous antisepsis. The region was infiltrated locally with 1% lidocaine. Under real-time ultrasound guidance, the right IJ vein was accessed with a 21 gauge micropuncture needle; the needle tip within the vein was  confirmed with ultrasound image documentation. The guidewire would not advance centrally. 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 The transitional dilator set was utilized through which an angled stiff glidewire was advanced through the stenotic right IJ vein and advanced into the IVC. A 5 French angiographic catheter was placed. Vascular measurement was performed. A small incision was made on the right anterior chest wall and a subcutaneous pocket fashioned. The power-injectable port was positioned and its catheter tunneled to the right IJ dermatotomy site. The angiographic catheter was exchanged over an Amplatz wire for a peel-away sheath, through which the port catheter, which had been trimmed to the appropriate length, was advanced and positioned under fluoroscopy with its tip at the cavoatrial junction. Spot chest radiograph confirms good catheter position and no pneumothorax. The port was flushed per protocol. The pocket was closed with deep interrupted and subcuticular continuous 3-0 Monocryl sutures. The incisions were covered with Dermabond then covered with a sterile dressing. The patient tolerated the procedure well. COMPLICATIONS: COMPLICATIONS None immediate IMPRESSION: Technically successful right IJ power-injectable port catheter placement. Ready for routine use. Electronically Signed   By: Corlis Leak M.D.   On: 11/08/2021 14:50    ASSESSMENT & PLAN Regina Mcmahon is a 71 y.o. female who returns for a follow up.  Malignant neoplasm of supraglottis --Thyroid US on 06/03/21 revealed a suspicious nodule located in the mid right thyroid lobe, measuring 5.0 cm in the greatest extent --Underwent laryngoscopy on 06/07/2021 which revealed a granular mass of the right glottis suggestive of glottic cancer. CT neck from 06/23/2021 showed 9 mm nodule right vocal cord. No enlarged lymph nodes were appreciated in the neck. An  enlarged right paratracheal lymph node was appreciated as well, noted as suspicious for metastatic disease, as well as the right thyroid nodule measuring 39 x 34 mm.  --Proceeded with biopsy of the right  vocal cord mass on 06/25/21 which revealed squamous cell carcinoma; p16 positive. --PET on 07/23/21 demonstrated the small right glottic lesion as mildly hypermetabolic and consistent with known neoplasm. PET also demonstrated: a borderline right level 2 lymph node; left supraclavicular, mediastinal and hilar lymphadenopathy; and a hypermetabolic left lower lobe pulmonary nodule, noted as likely a metastatic focus. No other findings for abdominal/pelvic metastatic disease or osseous metastatic disease were appreciated.  --Received definitive radiation from 08/16/2021 through 09/13/2021. 54 Gy in 20 Fx.  --Post treatment CT neck from 10/18/2021 showed resolved polypoid lesion of the larynx. No worrisome nodes in the neck. --Had follow up with Dr. Jenne Pane on 11/19/2021.  Posttreatment her laryngoscopy showed no evidence of residual cancer.  2. 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 --Underwent EBUS with biopsy of left lower lobe pulmonary nodule, Level 4R and 7 lymph nodes. Pathology confirmed squamous cell carcinoma of left lower lobe nodule along with level 4R and 7 level nodules --Started concurrent chemoradiation with weekly carbo/taxol on 11/01/2021. -- PD-L1 29% hence she is not a candidate for single agent immunotherapy.  We will consider  adjuvant immunotherapy after completion of concurrent chemoradiation for 1 year. --She is on concurrent chemotherapy radiation with CarboTaxol s/p 4 cycles.  --C5D1 was deferred last week due to thrombocytopenia.  --Patient presents today to resume chemotherapy with C5D1. Unfortunately, labs from today show persistent thrombocytopenia with plt count of 54K. We will defer chemotherapy to next week.    3. 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 --She is due for mammogram, will plan to schedule once lung cancer therapy is complete --She does have a standing order for mammogram  4. Leukopenia/Anemia/Thrombocytopenia: --Secondary to chemotherapy --WBC 2.4, Hgb 10.5, Plt 54 --Strict precautions given for neutropenic fever and bleeding. Patient reports intermittent episodes of epistaxis that resolves on its own. No other signs of bleeding.   5.Hypokalemia: --Secondary to diarrhea and lasix.  --Potassium level is 3.4 --Continue on potassium chloride 20 mEq twice daily  6. Nausea: --Takes Zofran and Compazine PRN. Advised to avoid taking Compazine and Xanax simultaneously to avoid sedation.  She prefers Zofran because this does not make her sleepy.  6. Oral thrush: --On nystatin mouthwash   Follow up: --IVF on MWF --12/13/2021: labs, follow up visit with Dr. Al Pimple, chemotherapy with carbo/taxol  No orders of the defined types were placed in this encounter.   All questions were answered. The patient knows to call the clinic with any problems, questions or concerns.  I have spent a total of 30 minutes minutes of face-to-face and non-face-to-face time, preparing to see the patient,  performing a medically appropriate examination, counseling and educating the patient, ordering medications/tests, documenting clinical information in the  electronic health record,  and care coordination.   Georga Kaufmann PA-C Dept of Hematology and Oncology [facility] Phone: [phone]

## 2021-12-07 ENCOUNTER — Ambulatory Visit: Payer: Medicare HMO

## 2021-12-07 ENCOUNTER — Ambulatory Visit
Admission: RE | Admit: 2021-12-07 | Discharge: 2021-12-07 | Disposition: A | Discharge status: Home / Self Care | Payer: Medicare HMO | Source: Ambulatory Visit | Attending: Radiation Oncology | Admitting: Radiation Oncology

## 2021-12-07 ENCOUNTER — Other Ambulatory Visit: Payer: Self-pay

## 2021-12-07 ENCOUNTER — Telehealth: Payer: Self-pay | Admitting: Hematology and Oncology

## 2021-12-07 DIAGNOSIS — C320 Malignant neoplasm of glottis: Secondary | ICD-10-CM | POA: Diagnosis not present

## 2021-12-07 DIAGNOSIS — C3432 Malignant neoplasm of lower lobe, left bronchus or lung: Secondary | ICD-10-CM | POA: Diagnosis not present

## 2021-12-07 DIAGNOSIS — Z87891 Personal history of nicotine dependence: Secondary | ICD-10-CM | POA: Diagnosis not present

## 2021-12-07 DIAGNOSIS — Z510 Encounter for antineoplastic radiation therapy: Secondary | ICD-10-CM | POA: Diagnosis not present

## 2021-12-07 LAB — RAD ONC ARIA SESSION SUMMARY
Course Elapsed Days: 34
Plan Fractions Treated to Date: 24
Plan Prescribed Dose Per Fraction: 2 Gy
Plan Total Fractions Prescribed: 30
Plan Total Prescribed Dose: 60 Gy
Reference Point Dosage Given to Date: 48 Gy
Reference Point Session Dosage Given: 2 Gy
Session Number: 24

## 2021-12-07 NOTE — Telephone Encounter (Signed)
Scheduled per 5/15 los, pt has been called and confirmed  ?

## 2021-12-07 NOTE — Progress Notes (Signed)
Per MD, OK to run IVF over 1 hour 12/08/21. Lexine Baton, Agricultural consultant aware.  ?

## 2021-12-08 ENCOUNTER — Other Ambulatory Visit: Payer: Self-pay

## 2021-12-08 ENCOUNTER — Ambulatory Visit
Admission: RE | Admit: 2021-12-08 | Discharge: 2021-12-08 | Disposition: A | Discharge status: Home / Self Care | Payer: Medicare HMO | Source: Ambulatory Visit | Attending: Radiation Oncology | Admitting: Radiation Oncology

## 2021-12-08 ENCOUNTER — Inpatient Hospital Stay: Payer: Medicare HMO

## 2021-12-08 ENCOUNTER — Telehealth: Payer: Self-pay | Admitting: *Deleted

## 2021-12-08 VITALS — BP 114/70 | HR 92 | Resp 16

## 2021-12-08 DIAGNOSIS — C321 Malignant neoplasm of supraglottis: Secondary | ICD-10-CM | POA: Diagnosis not present

## 2021-12-08 DIAGNOSIS — C3432 Malignant neoplasm of lower lobe, left bronchus or lung: Secondary | ICD-10-CM | POA: Diagnosis not present

## 2021-12-08 DIAGNOSIS — Z5111 Encounter for antineoplastic chemotherapy: Secondary | ICD-10-CM | POA: Diagnosis not present

## 2021-12-08 DIAGNOSIS — D696 Thrombocytopenia, unspecified: Secondary | ICD-10-CM | POA: Diagnosis not present

## 2021-12-08 DIAGNOSIS — Z9221 Personal history of antineoplastic chemotherapy: Secondary | ICD-10-CM | POA: Diagnosis not present

## 2021-12-08 DIAGNOSIS — R0602 Shortness of breath: Secondary | ICD-10-CM | POA: Diagnosis not present

## 2021-12-08 DIAGNOSIS — Z923 Personal history of irradiation: Secondary | ICD-10-CM | POA: Diagnosis not present

## 2021-12-08 DIAGNOSIS — Z79899 Other long term (current) drug therapy: Secondary | ICD-10-CM

## 2021-12-08 DIAGNOSIS — E876 Hypokalemia: Secondary | ICD-10-CM | POA: Diagnosis not present

## 2021-12-08 DIAGNOSIS — R110 Nausea: Secondary | ICD-10-CM | POA: Diagnosis not present

## 2021-12-08 DIAGNOSIS — Z800 Family history of malignant neoplasm of digestive organs: Secondary | ICD-10-CM | POA: Diagnosis not present

## 2021-12-08 DIAGNOSIS — Z87891 Personal history of nicotine dependence: Secondary | ICD-10-CM | POA: Diagnosis not present

## 2021-12-08 DIAGNOSIS — Z833 Family history of diabetes mellitus: Secondary | ICD-10-CM | POA: Diagnosis not present

## 2021-12-08 DIAGNOSIS — B370 Candidal stomatitis: Secondary | ICD-10-CM | POA: Diagnosis not present

## 2021-12-08 DIAGNOSIS — C50412 Malignant neoplasm of upper-outer quadrant of left female breast: Secondary | ICD-10-CM

## 2021-12-08 DIAGNOSIS — Z8041 Family history of malignant neoplasm of ovary: Secondary | ICD-10-CM | POA: Diagnosis not present

## 2021-12-08 DIAGNOSIS — Z170 Estrogen receptor positive status [ER+]: Secondary | ICD-10-CM | POA: Diagnosis not present

## 2021-12-08 DIAGNOSIS — M8580 Other specified disorders of bone density and structure, unspecified site: Secondary | ICD-10-CM

## 2021-12-08 DIAGNOSIS — R197 Diarrhea, unspecified: Secondary | ICD-10-CM | POA: Diagnosis not present

## 2021-12-08 DIAGNOSIS — R070 Pain in throat: Secondary | ICD-10-CM | POA: Diagnosis not present

## 2021-12-08 DIAGNOSIS — Z510 Encounter for antineoplastic radiation therapy: Secondary | ICD-10-CM | POA: Diagnosis not present

## 2021-12-08 DIAGNOSIS — R5383 Other fatigue: Secondary | ICD-10-CM | POA: Diagnosis not present

## 2021-12-08 DIAGNOSIS — Z8249 Family history of ischemic heart disease and other diseases of the circulatory system: Secondary | ICD-10-CM | POA: Diagnosis not present

## 2021-12-08 DIAGNOSIS — C320 Malignant neoplasm of glottis: Secondary | ICD-10-CM | POA: Diagnosis not present

## 2021-12-08 DIAGNOSIS — Z806 Family history of leukemia: Secondary | ICD-10-CM | POA: Diagnosis not present

## 2021-12-08 DIAGNOSIS — Z818 Family history of other mental and behavioral disorders: Secondary | ICD-10-CM | POA: Diagnosis not present

## 2021-12-08 DIAGNOSIS — D649 Anemia, unspecified: Secondary | ICD-10-CM | POA: Diagnosis not present

## 2021-12-08 LAB — RAD ONC ARIA SESSION SUMMARY
Course Elapsed Days: 35
Plan Fractions Treated to Date: 25
Plan Prescribed Dose Per Fraction: 2 Gy
Plan Total Fractions Prescribed: 30
Plan Total Prescribed Dose: 60 Gy
Reference Point Dosage Given to Date: 50 Gy
Reference Point Session Dosage Given: 2 Gy
Session Number: 25

## 2021-12-08 MED ORDER — HEPARIN SOD (PORK) LOCK FLUSH 100 UNIT/ML IV SOLN
500.0000 [IU] | Freq: Once | INTRAVENOUS | Status: AC | PRN
  Administered 2021-12-08: 500 [IU]

## 2021-12-08 MED ORDER — SODIUM CHLORIDE 0.9 % IV SOLN: Freq: Once | INTRAVENOUS | Status: AC

## 2021-12-08 MED ORDER — SODIUM CHLORIDE 0.9% FLUSH
10.0000 mL | Freq: Once | INTRAVENOUS | Status: AC | PRN
  Administered 2021-12-08: 10 mL

## 2021-12-08 NOTE — Telephone Encounter (Signed)
Spoke with the patient regarding a message that was left.  She states she is choking on her potassium pill.  She was advised to refrain from taking this medication and reach out to her doctor to see if this could be changed to liquid form.  She voiced she was currently in the medical oncology clinic receiving IV fluids and she would discuss this with them.  She was appreciative of the call. ? ?Gloriajean Dell. Leonie Green, BSN  ?

## 2021-12-08 NOTE — Patient Instructions (Signed)
Rehydration, Adult Rehydration is the replacement of body fluids, salts, and minerals (electrolytes) that are lost during dehydration. Dehydration is when there is not enough water or other fluids in the body. This happens when you lose more fluids than you take in. Common causes of dehydration include: Not drinking enough fluids. This can occur when you are ill or doing activities that require a lot of energy, especially in hot weather. Conditions that cause loss of water or other fluids, such as diarrhea, vomiting, sweating, or urinating a lot. Other illnesses, such as fever or infection. Certain medicines, such as those that remove excess fluid from the body (diuretics). Symptoms of mild or moderate dehydration may include thirst, dry lips and mouth, and dizziness. Symptoms of severe dehydration may include increased heart rate, confusion, fainting, and not urinating. For severe dehydration, you may need to get fluids through an IV at the hospital. For mild or moderate dehydration, you can usually rehydrate at home by drinking certain fluids as told by your health care provider. What are the risks? Generally, rehydration is safe. However, taking in too much fluid (overhydration) can be a problem. This is rare. Overhydration can cause an electrolyte imbalance, kidney failure, or a decrease in salt (sodium) levels in the body. Supplies needed You will need an oral rehydration solution (ORS) if your health care provider tells you to use one. This is a drink to treat dehydration. It can be found in pharmacies and retail stores. How to rehydrate Fluids Follow instructions from your health care provider for rehydration. The kind of fluid and the amount you should drink depend on your condition. In general, you should choose drinks that you prefer. If told by your health care provider, drink an ORS. Make an ORS by following instructions on the package. Start by drinking small amounts, about  cup (120  mL) every 5-10 minutes. Slowly increase how much you drink until you have taken the amount recommended by your health care provider. Drink enough clear fluids to keep your urine pale yellow. If you were told to drink an ORS, finish it first, then start slowly drinking other clear fluids. Drink fluids such as: Water. This includes sparkling water and flavored water. Drinking only water can lead to having too little sodium in your body (hyponatremia). Follow the advice of your health care provider. Water from ice chips you suck on. Fruit juice with water you add to it (diluted). Sports drinks. Hot or cold herbal teas. Broth-based soups. Milk or milk products. Food Follow instructions from your health care provider about what to eat while you rehydrate. Your health care provider may recommend that you slowly begin eating regular foods in small amounts. Eat foods that contain a healthy balance of electrolytes, such as bananas, oranges, potatoes, tomatoes, and spinach. Avoid foods that are greasy or contain a lot of sugar. In some cases, you may get nutrition through a feeding tube that is passed through your nose and into your stomach (nasogastric tube, or NG tube). This may be done if you have uncontrolled vomiting or diarrhea. Beverages to avoid  Certain beverages may make dehydration worse. While you rehydrate, avoid drinking alcohol. How to tell if you are recovering from dehydration You may be recovering from dehydration if: You are urinating more often than before you started rehydrating. Your urine is pale yellow. Your energy level improves. You vomit less frequently. You have diarrhea less frequently. Your appetite improves or returns to normal. You feel less dizzy or less light-headed.   Your skin tone and color start to look more normal. Follow these instructions at home: Take over-the-counter and prescription medicines only as told by your health care provider. Do not take sodium  tablets. Doing this can lead to having too much sodium in your body (hypernatremia). Contact a health care provider if: You continue to have symptoms of mild or moderate dehydration, such as: Thirst. Dry lips. Slightly dry mouth. Dizziness. Dark urine or less urine than normal. Muscle cramps. You continue to vomit or have diarrhea. Get help right away if you: Have symptoms of dehydration that get worse. Have a fever. Have a severe headache. Have been vomiting and the following happens: Your vomiting gets worse or does not go away. Your vomit includes blood or green matter (bile). You cannot eat or drink without vomiting. Have problems with urination or bowel movements, such as: Diarrhea that gets worse or does not go away. Blood in your stool (feces). This may cause stool to look black and tarry. Not urinating, or urinating only a small amount of very dark urine, within 6-8 hours. Have trouble breathing. Have symptoms that get worse with treatment. These symptoms may represent a serious problem that is an emergency. Do not wait to see if the symptoms will go away. Get medical help right away. Call your local emergency services (911 in the [place] Do not drive yourself to the hospital. Summary Rehydration is the replacement of body fluids and minerals (electrolytes) that are lost during dehydration. Follow instructions from your health care provider for rehydration. The kind of fluid and amount you should drink depend on your condition. Slowly increase how much you drink until you have taken the amount recommended by your health care provider. Contact your health care provider if you continue to show signs of mild or moderate dehydration. This information is not intended to replace advice given to you by your health care provider. Make sure you discuss any questions you have with your health care provider. Document Revised: 09/11/2019 Document Reviewed: 07/22/2019 Else[employer] Patient  Education  2023 [employer]  

## 2021-12-09 ENCOUNTER — Ambulatory Visit
Admission: RE | Admit: 2021-12-09 | Discharge: 2021-12-09 | Disposition: A | Discharge status: Home / Self Care | Payer: Medicare HMO | Source: Ambulatory Visit | Attending: Radiation Oncology | Admitting: Radiation Oncology

## 2021-12-09 ENCOUNTER — Other Ambulatory Visit: Payer: Self-pay

## 2021-12-09 DIAGNOSIS — Z510 Encounter for antineoplastic radiation therapy: Secondary | ICD-10-CM | POA: Diagnosis not present

## 2021-12-09 DIAGNOSIS — Z87891 Personal history of nicotine dependence: Secondary | ICD-10-CM | POA: Diagnosis not present

## 2021-12-09 DIAGNOSIS — C3432 Malignant neoplasm of lower lobe, left bronchus or lung: Secondary | ICD-10-CM | POA: Diagnosis not present

## 2021-12-09 DIAGNOSIS — C320 Malignant neoplasm of glottis: Secondary | ICD-10-CM | POA: Diagnosis not present

## 2021-12-09 LAB — RAD ONC ARIA SESSION SUMMARY
Course Elapsed Days: 36
Plan Fractions Treated to Date: 26
Plan Prescribed Dose Per Fraction: 2 Gy
Plan Total Fractions Prescribed: 30
Plan Total Prescribed Dose: 60 Gy
Reference Point Dosage Given to Date: 52 Gy
Reference Point Session Dosage Given: 2 Gy
Session Number: 26

## 2021-12-10 ENCOUNTER — Other Ambulatory Visit: Payer: Self-pay

## 2021-12-10 ENCOUNTER — Ambulatory Visit
Admission: RE | Admit: 2021-12-10 | Discharge: 2021-12-10 | Disposition: A | Discharge status: Home / Self Care | Payer: Medicare HMO | Source: Ambulatory Visit | Attending: Radiation Oncology | Admitting: Radiation Oncology

## 2021-12-10 ENCOUNTER — Inpatient Hospital Stay: Payer: Medicare HMO

## 2021-12-10 ENCOUNTER — Ambulatory Visit: Payer: Medicare HMO

## 2021-12-10 ENCOUNTER — Telehealth: Payer: Self-pay | Admitting: *Deleted

## 2021-12-10 VITALS — BP 114/68 | HR 95 | Resp 16

## 2021-12-10 DIAGNOSIS — Z170 Estrogen receptor positive status [ER+]: Secondary | ICD-10-CM | POA: Diagnosis not present

## 2021-12-10 DIAGNOSIS — C50412 Malignant neoplasm of upper-outer quadrant of left female breast: Secondary | ICD-10-CM | POA: Diagnosis not present

## 2021-12-10 DIAGNOSIS — Z818 Family history of other mental and behavioral disorders: Secondary | ICD-10-CM | POA: Diagnosis not present

## 2021-12-10 DIAGNOSIS — Z79899 Other long term (current) drug therapy: Secondary | ICD-10-CM | POA: Diagnosis not present

## 2021-12-10 DIAGNOSIS — Z87891 Personal history of nicotine dependence: Secondary | ICD-10-CM | POA: Diagnosis not present

## 2021-12-10 DIAGNOSIS — Z9221 Personal history of antineoplastic chemotherapy: Secondary | ICD-10-CM | POA: Diagnosis not present

## 2021-12-10 DIAGNOSIS — Z800 Family history of malignant neoplasm of digestive organs: Secondary | ICD-10-CM | POA: Diagnosis not present

## 2021-12-10 DIAGNOSIS — C321 Malignant neoplasm of supraglottis: Secondary | ICD-10-CM | POA: Diagnosis not present

## 2021-12-10 DIAGNOSIS — Z5111 Encounter for antineoplastic chemotherapy: Secondary | ICD-10-CM | POA: Diagnosis not present

## 2021-12-10 DIAGNOSIS — R197 Diarrhea, unspecified: Secondary | ICD-10-CM | POA: Diagnosis not present

## 2021-12-10 DIAGNOSIS — C3432 Malignant neoplasm of lower lobe, left bronchus or lung: Secondary | ICD-10-CM | POA: Diagnosis not present

## 2021-12-10 DIAGNOSIS — Z806 Family history of leukemia: Secondary | ICD-10-CM | POA: Diagnosis not present

## 2021-12-10 DIAGNOSIS — M8580 Other specified disorders of bone density and structure, unspecified site: Secondary | ICD-10-CM

## 2021-12-10 DIAGNOSIS — R070 Pain in throat: Secondary | ICD-10-CM | POA: Diagnosis not present

## 2021-12-10 DIAGNOSIS — Z923 Personal history of irradiation: Secondary | ICD-10-CM | POA: Diagnosis not present

## 2021-12-10 DIAGNOSIS — Z8041 Family history of malignant neoplasm of ovary: Secondary | ICD-10-CM | POA: Diagnosis not present

## 2021-12-10 DIAGNOSIS — D696 Thrombocytopenia, unspecified: Secondary | ICD-10-CM | POA: Diagnosis not present

## 2021-12-10 DIAGNOSIS — Z8249 Family history of ischemic heart disease and other diseases of the circulatory system: Secondary | ICD-10-CM | POA: Diagnosis not present

## 2021-12-10 DIAGNOSIS — C320 Malignant neoplasm of glottis: Secondary | ICD-10-CM | POA: Diagnosis not present

## 2021-12-10 DIAGNOSIS — E876 Hypokalemia: Secondary | ICD-10-CM | POA: Diagnosis not present

## 2021-12-10 DIAGNOSIS — R0602 Shortness of breath: Secondary | ICD-10-CM | POA: Diagnosis not present

## 2021-12-10 DIAGNOSIS — Z833 Family history of diabetes mellitus: Secondary | ICD-10-CM | POA: Diagnosis not present

## 2021-12-10 DIAGNOSIS — R110 Nausea: Secondary | ICD-10-CM | POA: Diagnosis not present

## 2021-12-10 DIAGNOSIS — D649 Anemia, unspecified: Secondary | ICD-10-CM | POA: Diagnosis not present

## 2021-12-10 DIAGNOSIS — B370 Candidal stomatitis: Secondary | ICD-10-CM | POA: Diagnosis not present

## 2021-12-10 DIAGNOSIS — Z510 Encounter for antineoplastic radiation therapy: Secondary | ICD-10-CM | POA: Diagnosis not present

## 2021-12-10 DIAGNOSIS — R5383 Other fatigue: Secondary | ICD-10-CM | POA: Diagnosis not present

## 2021-12-10 LAB — RAD ONC ARIA SESSION SUMMARY
Course Elapsed Days: 37
Plan Fractions Treated to Date: 27
Plan Prescribed Dose Per Fraction: 2 Gy
Plan Total Fractions Prescribed: 30
Plan Total Prescribed Dose: 60 Gy
Reference Point Dosage Given to Date: 54 Gy
Reference Point Session Dosage Given: 2 Gy
Session Number: 27

## 2021-12-10 MED ORDER — SODIUM CHLORIDE 0.9% FLUSH
10.0000 mL | Freq: Once | INTRAVENOUS | Status: AC | PRN
  Administered 2021-12-10: 10 mL

## 2021-12-10 MED ORDER — SODIUM CHLORIDE 0.9 % IV SOLN: Freq: Once | INTRAVENOUS | Status: AC

## 2021-12-10 MED ORDER — HEPARIN SOD (PORK) LOCK FLUSH 100 UNIT/ML IV SOLN
500.0000 [IU] | Freq: Once | INTRAVENOUS | Status: AC | PRN
  Administered 2021-12-10: 500 [IU]

## 2021-12-10 NOTE — Patient Instructions (Signed)
Rehydration, Adult Rehydration is the replacement of body fluids, salts, and minerals (electrolytes) that are lost during dehydration. Dehydration is when there is not enough water or other fluids in the body. This happens when you lose more fluids than you take in. Common causes of dehydration include: Not drinking enough fluids. This can occur when you are ill or doing activities that require a lot of energy, especially in hot weather. Conditions that cause loss of water or other fluids, such as diarrhea, vomiting, sweating, or urinating a lot. Other illnesses, such as fever or infection. Certain medicines, such as those that remove excess fluid from the body (diuretics). Symptoms of mild or moderate dehydration may include thirst, dry lips and mouth, and dizziness. Symptoms of severe dehydration may include increased heart rate, confusion, fainting, and not urinating. For severe dehydration, you may need to get fluids through an IV at the hospital. For mild or moderate dehydration, you can usually rehydrate at home by drinking certain fluids as told by your health care provider. What are the risks? Generally, rehydration is safe. However, taking in too much fluid (overhydration) can be a problem. This is rare. Overhydration can cause an electrolyte imbalance, kidney failure, or a decrease in salt (sodium) levels in the body. Supplies needed You will need an oral rehydration solution (ORS) if your health care provider tells you to use one. This is a drink to treat dehydration. It can be found in pharmacies and retail stores. How to rehydrate Fluids Follow instructions from your health care provider for rehydration. The kind of fluid and the amount you should drink depend on your condition. In general, you should choose drinks that you prefer. If told by your health care provider, drink an ORS. Make an ORS by following instructions on the package. Start by drinking small amounts, about  cup (120  mL) every 5-10 minutes. Slowly increase how much you drink until you have taken the amount recommended by your health care provider. Drink enough clear fluids to keep your urine pale yellow. If you were told to drink an ORS, finish it first, then start slowly drinking other clear fluids. Drink fluids such as: Water. This includes sparkling water and flavored water. Drinking only water can lead to having too little sodium in your body (hyponatremia). Follow the advice of your health care provider. Water from ice chips you suck on. Fruit juice with water you add to it (diluted). Sports drinks. Hot or cold herbal teas. Broth-based soups. Milk or milk products. Food Follow instructions from your health care provider about what to eat while you rehydrate. Your health care provider may recommend that you slowly begin eating regular foods in small amounts. Eat foods that contain a healthy balance of electrolytes, such as bananas, oranges, potatoes, tomatoes, and spinach. Avoid foods that are greasy or contain a lot of sugar. In some cases, you may get nutrition through a feeding tube that is passed through your nose and into your stomach (nasogastric tube, or NG tube). This may be done if you have uncontrolled vomiting or diarrhea. Beverages to avoid  Certain beverages may make dehydration worse. While you rehydrate, avoid drinking alcohol. How to tell if you are recovering from dehydration You may be recovering from dehydration if: You are urinating more often than before you started rehydrating. Your urine is pale yellow. Your energy level improves. You vomit less frequently. You have diarrhea less frequently. Your appetite improves or returns to normal. You feel less dizzy or less light-headed.   Your skin tone and color start to look more normal. Follow these instructions at home: Take over-the-counter and prescription medicines only as told by your health care provider. Do not take sodium  tablets. Doing this can lead to having too much sodium in your body (hypernatremia). Contact a health care provider if: You continue to have symptoms of mild or moderate dehydration, such as: Thirst. Dry lips. Slightly dry mouth. Dizziness. Dark urine or less urine than normal. Muscle cramps. You continue to vomit or have diarrhea. Get help right away if you: Have symptoms of dehydration that get worse. Have a fever. Have a severe headache. Have been vomiting and the following happens: Your vomiting gets worse or does not go away. Your vomit includes blood or green matter (bile). You cannot eat or drink without vomiting. Have problems with urination or bowel movements, such as: Diarrhea that gets worse or does not go away. Blood in your stool (feces). This may cause stool to look black and tarry. Not urinating, or urinating only a small amount of very dark urine, within 6-8 hours. Have trouble breathing. Have symptoms that get worse with treatment. These symptoms may represent a serious problem that is an emergency. Do not wait to see if the symptoms will go away. Get medical help right away. Call your local emergency services (911 in the [place] Do not drive yourself to the hospital. Summary Rehydration is the replacement of body fluids and minerals (electrolytes) that are lost during dehydration. Follow instructions from your health care provider for rehydration. The kind of fluid and amount you should drink depend on your condition. Slowly increase how much you drink until you have taken the amount recommended by your health care provider. Contact your health care provider if you continue to show signs of mild or moderate dehydration. This information is not intended to replace advice given to you by your health care provider. Make sure you discuss any questions you have with your health care provider. Document Revised: 09/11/2019 Document Reviewed: 07/22/2019 Else[employer] Patient  Education  2023 [employer]  

## 2021-12-10 NOTE — Telephone Encounter (Signed)
Per MD - if pt feels ok - may run fluids over an hour - or only give 1/2 liter.

## 2021-12-13 ENCOUNTER — Encounter: Payer: Self-pay | Admitting: Hematology and Oncology

## 2021-12-13 ENCOUNTER — Inpatient Hospital Stay: Payer: Medicare HMO

## 2021-12-13 ENCOUNTER — Inpatient Hospital Stay: Payer: Medicare HMO | Admitting: Nutrition

## 2021-12-13 ENCOUNTER — Inpatient Hospital Stay (HOSPITAL_BASED_OUTPATIENT_CLINIC_OR_DEPARTMENT_OTHER): Payer: Medicare HMO | Admitting: Hematology and Oncology

## 2021-12-13 ENCOUNTER — Other Ambulatory Visit: Payer: Self-pay

## 2021-12-13 ENCOUNTER — Ambulatory Visit
Admission: RE | Admit: 2021-12-13 | Discharge: 2021-12-13 | Disposition: A | Discharge status: Home / Self Care | Payer: Medicare HMO | Source: Ambulatory Visit | Attending: Radiation Oncology | Admitting: Radiation Oncology

## 2021-12-13 ENCOUNTER — Ambulatory Visit: Payer: Medicare HMO

## 2021-12-13 VITALS — BP 131/79 | HR 100 | Temp 98.2°F | Resp 17 | Wt 196.2 lb

## 2021-12-13 DIAGNOSIS — Z79899 Other long term (current) drug therapy: Secondary | ICD-10-CM | POA: Diagnosis not present

## 2021-12-13 DIAGNOSIS — Z800 Family history of malignant neoplasm of digestive organs: Secondary | ICD-10-CM | POA: Diagnosis not present

## 2021-12-13 DIAGNOSIS — C50412 Malignant neoplasm of upper-outer quadrant of left female breast: Secondary | ICD-10-CM

## 2021-12-13 DIAGNOSIS — Z8249 Family history of ischemic heart disease and other diseases of the circulatory system: Secondary | ICD-10-CM | POA: Diagnosis not present

## 2021-12-13 DIAGNOSIS — Z5111 Encounter for antineoplastic chemotherapy: Secondary | ICD-10-CM | POA: Diagnosis not present

## 2021-12-13 DIAGNOSIS — R070 Pain in throat: Secondary | ICD-10-CM | POA: Diagnosis not present

## 2021-12-13 DIAGNOSIS — C3432 Malignant neoplasm of lower lobe, left bronchus or lung: Secondary | ICD-10-CM | POA: Diagnosis not present

## 2021-12-13 DIAGNOSIS — C320 Malignant neoplasm of glottis: Secondary | ICD-10-CM | POA: Diagnosis not present

## 2021-12-13 DIAGNOSIS — Z170 Estrogen receptor positive status [ER+]: Secondary | ICD-10-CM

## 2021-12-13 DIAGNOSIS — R5383 Other fatigue: Secondary | ICD-10-CM | POA: Diagnosis not present

## 2021-12-13 DIAGNOSIS — Z833 Family history of diabetes mellitus: Secondary | ICD-10-CM | POA: Diagnosis not present

## 2021-12-13 DIAGNOSIS — D696 Thrombocytopenia, unspecified: Secondary | ICD-10-CM | POA: Diagnosis not present

## 2021-12-13 DIAGNOSIS — Z923 Personal history of irradiation: Secondary | ICD-10-CM | POA: Diagnosis not present

## 2021-12-13 DIAGNOSIS — E876 Hypokalemia: Secondary | ICD-10-CM | POA: Diagnosis not present

## 2021-12-13 DIAGNOSIS — Z806 Family history of leukemia: Secondary | ICD-10-CM | POA: Diagnosis not present

## 2021-12-13 DIAGNOSIS — Z9221 Personal history of antineoplastic chemotherapy: Secondary | ICD-10-CM | POA: Diagnosis not present

## 2021-12-13 DIAGNOSIS — Z818 Family history of other mental and behavioral disorders: Secondary | ICD-10-CM | POA: Diagnosis not present

## 2021-12-13 DIAGNOSIS — C321 Malignant neoplasm of supraglottis: Secondary | ICD-10-CM | POA: Diagnosis not present

## 2021-12-13 DIAGNOSIS — R197 Diarrhea, unspecified: Secondary | ICD-10-CM | POA: Diagnosis not present

## 2021-12-13 DIAGNOSIS — D649 Anemia, unspecified: Secondary | ICD-10-CM | POA: Diagnosis not present

## 2021-12-13 DIAGNOSIS — B370 Candidal stomatitis: Secondary | ICD-10-CM | POA: Diagnosis not present

## 2021-12-13 DIAGNOSIS — R0602 Shortness of breath: Secondary | ICD-10-CM | POA: Diagnosis not present

## 2021-12-13 DIAGNOSIS — Z8041 Family history of malignant neoplasm of ovary: Secondary | ICD-10-CM | POA: Diagnosis not present

## 2021-12-13 DIAGNOSIS — Z510 Encounter for antineoplastic radiation therapy: Secondary | ICD-10-CM | POA: Diagnosis not present

## 2021-12-13 DIAGNOSIS — R110 Nausea: Secondary | ICD-10-CM | POA: Diagnosis not present

## 2021-12-13 DIAGNOSIS — Z87891 Personal history of nicotine dependence: Secondary | ICD-10-CM | POA: Diagnosis not present

## 2021-12-13 LAB — CBC WITH DIFFERENTIAL/PLATELET
Abs Immature Granulocytes: 0 10*3/uL (ref 0.00–0.07)
Basophils Absolute: 0 10*3/uL (ref 0.0–0.1)
Basophils Relative: 0 %
Eosinophils Absolute: 0 10*3/uL (ref 0.0–0.5)
Eosinophils Relative: 1 %
HCT: 27.5 % — ABNORMAL LOW (ref 36.0–46.0)
Hemoglobin: 9.7 g/dL — ABNORMAL LOW (ref 12.0–15.0)
Immature Granulocytes: 0 %
Lymphocytes Relative: 15 %
Lymphs Abs: 0.4 10*3/uL — ABNORMAL LOW (ref 0.7–4.0)
MCH: 32.1 pg (ref 26.0–34.0)
MCHC: 35.3 g/dL (ref 30.0–36.0)
MCV: 91.1 fL (ref 80.0–100.0)
Monocytes Absolute: 0.3 10*3/uL (ref 0.1–1.0)
Monocytes Relative: 10 %
Neutro Abs: 2 10*3/uL (ref 1.7–7.7)
Neutrophils Relative %: 74 %
Platelets: 96 10*3/uL — ABNORMAL LOW (ref 150–400)
RBC: 3.02 MIL/uL — ABNORMAL LOW (ref 3.87–5.11)
RDW: 18.8 % — ABNORMAL HIGH (ref 11.5–15.5)
WBC: 2.7 10*3/uL — ABNORMAL LOW (ref 4.0–10.5)
nRBC: 0 % (ref 0.0–0.2)

## 2021-12-13 LAB — RAD ONC ARIA SESSION SUMMARY
Course Elapsed Days: 40
Plan Fractions Treated to Date: 28
Plan Prescribed Dose Per Fraction: 2 Gy
Plan Total Fractions Prescribed: 30
Plan Total Prescribed Dose: 60 Gy
Reference Point Dosage Given to Date: 56 Gy
Reference Point Session Dosage Given: 2 Gy
Session Number: 28

## 2021-12-13 LAB — COMPREHENSIVE METABOLIC PANEL
ALT: 8 U/L (ref 0–44)
AST: 10 U/L — ABNORMAL LOW (ref 15–41)
Albumin: 3.8 g/dL (ref 3.5–5.0)
Alkaline Phosphatase: 72 U/L (ref 38–126)
Anion gap: 5 (ref 5–15)
BUN: 16 mg/dL (ref 8–23)
CO2: 35 mmol/L — ABNORMAL HIGH (ref 22–32)
Calcium: 9.6 mg/dL (ref 8.9–10.3)
Chloride: 100 mmol/L (ref 98–111)
Creatinine, Ser: 0.76 mg/dL (ref 0.44–1.00)
GFR, Estimated: >60 mL/min (ref >60)
Glucose, Bld: 126 mg/dL — ABNORMAL HIGH (ref 70–99)
Potassium: 3.3 mmol/L — ABNORMAL LOW (ref 3.5–5.1)
Sodium: 140 mmol/L (ref 135–145)
Total Bilirubin: 2.8 mg/dL — ABNORMAL HIGH (ref 0.3–1.2)
Total Protein: 6.9 g/dL (ref 6.5–8.1)

## 2021-12-13 NOTE — Progress Notes (Signed)
[facility] Telephone:([phone]   Fax:([phone]) [phone]  PROGRESS NOTE  Patient Care Team: [name], MD as PCP - General (Internal Medicine) Herminio Commons, MD (Inactive) as PCP - Cardiology (Cardiology) Whitney Muse, Kelby Fam, MD (Inactive) as Consulting Physician (Hematology and Oncology) Erroll Luna, MD as Consulting Physician (General Surgery) Thea Silversmith, MD as Consulting Physician (Radiation Oncology) Sylvan Cheese, NP as Nurse Practitioner (Hematology and Oncology) Danie Binder, MD (Inactive) as Consulting Physician (Gastroenterology) Malmfelt, Stephani Police, RN as Oncology Nurse Navigator Melida Quitter, MD as Consulting Physician (Otolaryngology) Eppie Gibson, MD as Consulting Physician (Radiation Oncology) Benay Pike, MD as Consulting Physician (Hematology and Oncology)  CHIEF COMPLAINTS/PURPOSE OF CONSULTATION:  1.Malignant neoplasm of glottis/supraglottis  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 3. History of left breast cancer  Oncology History  Carcinoma of upper-outer quadrant of left female breast ([name])  06/24/2014 Mammogram   Left breast: 6 x 3 x 2.5 cm area of ill-defined increased density in the UOQ,  corresponding to the mass felt by the patient, not in the same location of the previously biopsied benign calcifications.     06/24/2014 Breast US   Left breast: ill-defined predominantly hypoechoic area with some increased echogenicity in the 2 o'clock position of the left breast, 7 cm from the nipple. 2.6 x 2.3 x 1.8 cm in maximum dimensions.    07/01/2014 Initial Biopsy   Left breast core needle bx: Invasive mammary carcinoma with lobular features, ER+ (100%), PR+ (91%), HER2/neu negative (ratio 1.09), Ki-67 32%. E-cadherin strongly positive, diagnostic for IDC     07/01/2014 Clinical Stage   Stage IIA: T2 N0    08/20/2014 Definitive Surgery   Left breast seed localized lumpectomy/SLNB: IDC, +LVI, DCIS, 1 LN  removed and positive for metastatic carcinoma. Grade 2. HER2/neu repeated and negative (ratio 0.69-1.9).     08/20/2014 Pathologic Stage   Stage IIB: pT2 pN1a pMx    09/17/2014 Surgery   Port-a cath placement and re-excision    10/02/2014 - 01/15/2015 Chemotherapy   CMF x 6 cycles.  patient refused Taxane-containing chemotherapy.    03/07/2015 Procedure   Comp Cancer Panel reveals no variant at APC, ATM, AXIN2, BARD1, BMPR1A, BRCA1, BRCA2, BRIP1, CDH1, CDK4, CDKN2A, CHEK2, FANCC, MLH1, MSH2, MSH6, MUTYH, NBN, PALB2, PMS2, POLD1, POLE, PTEN, RAD51C, RAD51D, SMAD4, STK11, TP53, VHL, and XRCC2.     04/06/2015 - 05/21/2015 Radiation Therapy   Adjuvant RT Pablo Ledger): Left breast/ 45 Gy over 25 fractions.   Left supraclavicular fossa and axilla/ 45 Gy over 25 fractions. Left breast boost/ 16 Gy over 8 fractions. Total dose: 61 Gy    06/04/2015 - 08/07/2015 Anti-estrogen oral therapy   Exemestane 25 mg daily.  Planned duration of therapy 5 years.     07/23/2015 Survivorship   Survivorship care plan completed and mailed to patient in lieu of in person visit    08/06/2015 Adverse Reaction   Severe hot flashes and mood swings    08/08/2015 - 12/07/2015 Anti-estrogen oral therapy   Arimidex    12/04/2015 Adverse Reaction   Worsening depression, patient stopped Arimidex    12/07/2015 -  Anti-estrogen oral therapy   Tamoxifen    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[place])  07/30/2021 Initial Diagnosis   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[place]) P 16 positive cT1N1   08/16/2021 - 09/13/2021 Radiation Therapy   Completed definitive radiation   09/30/2021 Pathology Results   [facility] Pathology PDL 1 29%   Squamous cell lung cancer (  [place])  07/23/2021 Imaging    PET/CT  demonstrated small right glottic lesion is mildly hypermetabolic and consistent with no neoplasm.  PET also demonstrated borderline right level 2 lymph node left supraclavicular, mediastinal and hilar lymphadenopathy and  hypermetabolic left lower lobe pulmonary nodule all suspicious of metastatic focus.  No abdominal pelvic metastatic disease or osseous metastatic disease was appreciated.   08/31/2021 Initial Diagnosis   Squamous cell carcinoma of lung ([place])    09/08/2021 Cancer Staging   Staging form: Lung, AJCC 8th Edition - Clinical stage from 09/08/2021: Stage IIIB (cT1b, cN3, cM0) - Signed by Eppie Gibson, MD on 09/08/2021 Stage prefix: Initial diagnosis    09/30/2021 Pathology Results   [facility] Pathology PDL 1 29%   11/01/2021 -  Chemotherapy   Patient is on Treatment Plan : LUNG Carboplatin / Paclitaxel + XRT q7d       INTERIM HISTORY:  Bronson Ing 71 y.o. female returns for a follow up prior to C5D1 of weekly carbo/taxol given concurrent with radiation for squamous  cell carcinoma of the lung.  She is here for follow-up with her sister.  She tells me that she feels terrible, very exhausted, cannot eat, cannot drink much.  Her whole chest has been burning.  She has lost about 5 pounds in the past 1 week.  Her saliva is so thick that she ends up choking on her own saliva.  Overall she feels not motivated at all to proceed with chemotherapy or radiation.  No fevers or chills.  No bleeding issues.  Rest of the pertinent 10 point ROS reviewed and negative.  MEDICAL HISTORY:  Past Medical History:  Diagnosis Date   Arthritis    Asthma    Asymptomatic varicose veins    Breast cancer ([place]) 06/2014   left   Cancer ([place])    Supra Glottis   Chronic airway obstruction ([facility])    Complication of anesthesia 2016   difficulty waking up, Oxygen dropped low.   COPD (chronic obstructive pulmonary disease) ([facility])    Depression    Dyspnea    GERD (gastroesophageal reflux disease)    Hemorrhage rectum    and anus.   Hypertension    Insomnia    Other symptoms involving digestive system(787.99)    Pain    Hx.pain in joint involving pelvic region and thigh; pain in limb   Palpitations    Panic  attacks    Personal history of chemotherapy 2016   Personal history of radiation therapy 2016   Pneumonia    Pre-diabetes    Prediabetes    Sinusitis    Symptomatic menopausal or female climacteric states    Varicose veins of both lower extremities with pain     SURGICAL HISTORY: Past Surgical History:  Procedure Laterality Date   BREAST BIOPSY Left 10/2012   BREAST BIOPSY Left 07/01/2014   malignant   BREAST LUMPECTOMY Left 08/20/2014   BRONCHIAL BIOPSY  08/23/2021   Procedure: BRONCHIAL BIOPSIES;  Surgeon: Collene Gobble, MD;  Location: [facility];  Service: Pulmonary;;   BRONCHIAL BRUSHINGS  08/23/2021   Procedure: BRONCHIAL BRUSHINGS;  Surgeon: Collene Gobble, MD;  Location: [facility] END[facility]COPY;  Service: Pulmonary;;   BRONCHIAL NEEDLE ASPIRATION BIOPSY  08/23/2021   Procedure: BRONCHIAL NEEDLE ASPIRATION BIOPSIES;  Surgeon: Collene Gobble, MD;  Location: [facility];  Service: Pulmonary;;   BRONCHIAL WASHINGS  08/23/2021   Procedure: BRONCHIAL WASHINGS;  Surgeon: Collene Gobble, MD;  Location: [facility] ENDOSCOPY;  Service: Pulmonary;;  CESAREAN SECTION     CHOLECYSTECTOMY  1985   COLONOSCOPY  2002   Dr. Lindalou Hose: internal hemorrhoids, one small rectal polyp, adenomatous path    COLONOSCOPY N/A 11/23/2015   Procedure: COLONOSCOPY;  Surgeon: Danie Binder, MD;  Location: [facility];  Service: Endoscopy;  Laterality: N/A;  1100 - moved to 10:45 - office to notify   ESOPHAGOGASTRODUODENOSCOPY N/A 11/23/2015   Procedure: ESOPHAGOGASTRODUODENOSCOPY (EGD);  Surgeon: Danie Binder, MD;  [facility]: [facility];  Service: Endoscopy;  Laterality: N/A;   IR IMAGING GUIDED PORT INSERTION  11/08/2021   PORT-A-CATH REMOVAL  02/2015   PORTACATH PLACEMENT Right 09/17/2014   Procedure: INSERTION PORT-A-CATH WITH ULTRASOUND;  Surgeon: Erroll Luna, MD;  Location: [facility];  Service: General;  Laterality: Right;  IJ   RADIOACTIVE SEED GUIDED PARTIAL MASTECTOMY WITH AXILLARY SENTINEL  LYMPH NODE BIOPSY Left 08/20/2014   Procedure: LEFT BREAST LUMPECTOMY WITH RADIOACTIVE SEED LOCALIZATION AND SENTINEL LYMPH NODE MAPPING;  Surgeon: Erroll Luna, MD;  Location: [facility];  Service: General;  Laterality: Left;   RE-EXCISION OF BREAST LUMPECTOMY Left 09/17/2014   Procedure: RE-EXCISION OF BREAST LUMPECTOMY;  Surgeon: Erroll Luna, MD;  Location: [facility];  Service: General;  Laterality: Left;   TOTAL HIP ARTHROPLASTY Right 02/22/2021   Procedure: RIGHT TOTAL HIP ARTHROPLASTY ANTERIOR APPROACH;  Surgeon: Marybelle Killings, MD;  Location: [facility];  Service: Orthopedics;  Laterality: Right;   TUBAL LIGATION     VIDEO BRONCHOSCOPY WITH ENDOBRONCHIAL ULTRASOUND N/A 08/23/2021   Procedure: VIDEO BRONCHOSCOPY WITH ENDOBRONCHIAL ULTRASOUND;  Surgeon: Collene Gobble, MD;  Location: [facility] ENDOSCOPY;  Service: Pulmonary;  Laterality: N/A;   VIDEO BRONCHOSCOPY WITH RADIAL ENDOBRONCHIAL ULTRASOUND  08/23/2021   Procedure: VIDEO BRONCHOSCOPY WITH RADIAL ENDOBRONCHIAL ULTRASOUND;  Surgeon: Collene Gobble, MD;  Location: [facility];  Service: Pulmonary;;    SOCIAL HISTORY: Social History   Socioeconomic History   Marital status: Divorced    Spouse name: Not on file   Number of children: 2   Years of education: Not on file   Highest education level: Not on file  Occupational History   Not on file  Tobacco Use   Smoking status: Former    Packs/day: 1.00    Years: 42.00    Pack years: 42.00    Types: Cigarettes    Quit date: 02/22/2021    Years since quitting: 0.8   Smokeless tobacco: Never  Vaping Use   Vaping Use: Never used  Substance and Sexual Activity   Alcohol use: No    Alcohol/week: 0.0 standard drinks   Drug use: No   Sexual activity: Never    Birth control/protection: None  Other Topics Concern   Not on file  Social History Narrative   Not on file   Social Determinants of Health   Financial Resource Strain: Not on file  Food Insecurity:  Not on file  Transportation Needs: Not on file  Physical Activity: Not on file  Stress: Not on file  Social Connections: Unknown   Frequency of Communication with Friends and Family: More than three times a week   Frequency of Social Gatherings with Friends and Family: More than three times a week   Attends Religious Services: Not on file   Active Member of Clubs or Organizations: Not on file   Attends Archivist Meetings: Not on file   Marital Status: Not on file  Intimate Partner Violence: Not on file  FAMILY HISTORY: Family History  Problem Relation Age of Onset   Diabetes Mother    Ovarian cancer Mother 17   Other Father        died in MVA at age 31   Other Sister        mitral valve disorder   Melanoma Sister 62       removed from back of leg   Colon cancer Brother        early 14s, succumbed to disease   Cancer Paternal Aunt        leukemia, bone, or lung cancer   Alzheimer's disease Maternal Grandmother    Heart attack Maternal Grandfather    Heart attack Paternal Grandmother    COPD Paternal Grandfather    Emphysema Paternal Grandfather    Congestive Heart Failure Paternal Aunt    Stomach cancer Paternal Uncle    Kidney cancer Maternal Uncle    Cancer Cousin        dx. teens/dx. 81s   Cancer Cousin    Colon cancer Cousin     ALLERGIES:  has No Known Allergies.  MEDICATIONS:  Current Outpatient Medications  Medication Sig Dispense Refill   albuterol (PROVENTIL HFA;VENTOLIN HFA) 108 (90 BASE) MCG/ACT inhaler Inhale 2 puffs into the lungs every 6 (six) hours as needed for wheezing or shortness of breath.      ALPRAZolam (XANAX) 0.5 MG tablet Take 0.5 mg by mouth 3 (three) times daily.     ANORO ELLIPTA 62.5-25 MCG/INH AEPB Inhale 1 puff into the lungs daily.     aspirin EC 325 MG EC tablet Take 1 tablet (325 mg total) by mouth daily with breakfast. Must take at least 4 weeks postop for DVT prophylaxis 30 tablet 0   calcium carbonate (TUMS - DOSED  IN MG ELEMENTAL CALCIUM) 500 MG chewable tablet Chew 2 tablets by mouth 3 (three) times daily as needed for indigestion or heartburn.     fluconazole (DIFLUCAN) 100 MG tablet Take 2 tablets today, then 1 tablet daily x 20 more days. 22 tablet 0   furosemide (LASIX) 20 MG tablet Take 20 mg by mouth daily.     hydrochlorothiazide (HYDRODIURIL) 12.5 MG tablet Take 12.5 mg by mouth daily.     HYDROcodone-acetaminophen (HYCET) 7.5-325 mg/15 ml solution Take 10 mLs by mouth every 12 (twelve) hours as needed for moderate pain. 473 mL 0   ibuprofen (ADVIL) 800 MG tablet Take 800 mg by mouth every 8 (eight) hours as needed for moderate pain.     lidocaine (XYLOCAINE) 2 % solution Patient: Mix 1part 2% viscous lidocaine, 1part H20. Swallow 59m of diluted mixture, 319m before meals and at bedtime, up to QID for sore throat. 200 mL 3   lidocaine-prilocaine (EMLA) cream Apply 1 application. topically as needed. 30 g 0   Menthol, Topical Analgesic, (BIOFREEZE EX) Apply 1 application topically daily as needed (pain).     nystatin (MYCOSTATIN) 100000 UNIT/ML suspension Take 5 mLs (500,000 Units total) by mouth 4 (four) times daily. 60 mL 0   ondansetron (ZOFRAN) 8 MG tablet Take 1 tablet (8 mg total) by mouth every 8 (eight) hours as needed for nausea or vomiting. 60 tablet 0   pantoprazole (PROTONIX) 40 MG tablet Take 1 tablet by mouth once daily 90 tablet 0   potassium chloride (KLOR-CON M) 10 MEQ tablet Take 10 mEq by mouth daily. Patient stated stated that she was started on 20 meq daily in August.     potassium chloride (  MICRO-K) 10 MEQ CR capsule Take 2 capsules (20 mEq total) by mouth 2 (two) times daily. 120 capsule 2   prochlorperazine (COMPAZINE) 10 MG tablet Take 1 tablet (10 mg total) by mouth every 6 (six) hours as needed for nausea or vomiting. 60 tablet 0   sertraline (ZOLOFT) 100 MG tablet Take 100 mg by mouth daily.     sucralfate (CARAFATE) 1 g tablet Dissolve 1 tablet in 10 mL H20 and swallow 30  min prior to meals and bedtime. 40 tablet 3   No current facility-administered medications for this visit.      PHYSICAL EXAMINATION: ECOG PERFORMANCE STATUS: 1 - Symptomatic but completely ambulatory  Vitals:   12/13/21 1133  BP: 131/79  Pulse: 100  Resp: 17  Temp: 98.2 F (36.8 C)  SpO2: 93%   Filed Weights   12/13/21 1133  Weight: 196 lb 3.2 oz (89 kg)   Physical Exam Constitutional:      Appearance: Normal appearance.  Cardiovascular:     Rate and Rhythm: Normal rate and regular rhythm.     Pulses: Normal pulses.     Heart sounds: Normal heart sounds.  Pulmonary:     Effort: Pulmonary effort is normal.     Breath sounds: Normal breath sounds.  Abdominal:     General: Abdomen is flat.     Palpations: Abdomen is soft.  Musculoskeletal:     Cervical back: Normal range of motion and neck supple. No rigidity.  Lymphadenopathy:     Cervical: No cervical adenopathy.  Skin:    General: Skin is warm and dry.  Neurological:     General: No focal deficit present.     Mental Status: She is alert.     LABORATORY DATA:  I have reviewed the data as listed    Latest Ref Rng & Units 12/13/2021   11:17 AM 12/06/2021   11:46 AM 11/30/2021   12:08 PM  CBC  WBC 4.0 - 10.5 K/uL 2.7   2.4   2.6    Hemoglobin 12.0 - 15.0 g/dL 9.7   10.5   11.1    Hematocrit 36.0 - 46.0 % 27.5   29.2   31.4    Platelets 150 - 400 K/uL 96   54   52         Latest Ref Rng & Units 12/13/2021   11:17 AM 12/06/2021   11:46 AM 11/29/2021   12:49 PM  CMP  Glucose 70 - 99 mg/dL 126   131   150    BUN 8 - 23 mg/dL '16   16   21    ' Creatinine 0.44 - 1.00 mg/dL 0.76   0.77   0.93    Sodium 135 - 145 mmol/L 140   139   140    Potassium 3.5 - 5.1 mmol/L 3.3   3.4   3.7    Chloride 98 - 111 mmol/L 100   100   101    CO2 22 - 32 mmol/L 35   34   34    Calcium 8.9 - 10.3 mg/dL 9.6   9.6   9.9    Total Protein 6.5 - 8.1 g/dL 6.9   6.6   7.4    Total Bilirubin 0.3 - 1.2 mg/dL 2.8   3.0   2.6    Alkaline  Phos 38 - 126 U/L 72   73   76    AST 15 - 41 U/L 10   9  13    ALT 0 - 44 U/L '8   8   11     ' RADIOGRAPHIC STUDIES: I have personally reviewed the radiological images as listed and agreed with the findings in the report. No results found.  ASSESSMENT & PLAN SHENIECE RUGGLES is a 71 y.o. female who returns for a follow up.  Malignant neoplasm of supraglottis --Thyroid US on 06/03/21 revealed a suspicious nodule located in the mid right thyroid lobe, measuring 5.0 cm in the greatest extent --Underwent laryngoscopy on 06/07/2021 which revealed a granular mass of the right glottis suggestive of glottic cancer. CT neck from 06/23/2021 showed 9 mm nodule right vocal cord. No enlarged lymph nodes were appreciated in the neck. An enlarged right paratracheal lymph node was appreciated as well, noted as suspicious for metastatic disease, as well as the right thyroid nodule measuring 39 x 34 mm.  --Proceeded with biopsy of the right vocal cord mass on 06/25/21 which revealed squamous cell carcinoma; p16 positive. --PET on 07/23/21 demonstrated the small right glottic lesion as mildly hypermetabolic and consistent with known neoplasm. PET also demonstrated: a borderline right level 2 lymph node; left supraclavicular, mediastinal and hilar lymphadenopathy; and a hypermetabolic left lower lobe pulmonary nodule, noted as likely a metastatic focus. No other findings for abdominal/pelvic metastatic disease or osseous metastatic disease were appreciated.  --Received definitive radiation from 08/16/2021 through 09/13/2021. 54 Gy in 20 Fx.  --Post treatment CT neck from 10/18/2021 showed resolved polypoid lesion of the larynx. No worrisome nodes in the neck. --Had follow up with Dr. Redmond Baseman on 11/19/2021.  Posttreatment her laryngoscopy showed no evidence of residual cancer. No concern for recurrence on exam today.  She should consider imaging at 1 year and follow-up with ENT as well as radiation oncology team for  surveillance recommendations  2. 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 --Underwent EBUS with biopsy of left lower lobe pulmonary nodule, Level 4R and 7 lymph nodes. Pathology confirmed squamous cell carcinoma of left lower lobe nodule along with level 4R and 7 level nodules --Started concurrent chemoradiation with weekly carbo/taxol on 11/01/2021. -- PD-L1 29% hence she is not a candidate for single agent immunotherapy.  We will consider adjuvant immunotherapy after completion of concurrent chemoradiation for 1 year. --She is on concurrent chemotherapy radiation with CarboTaxol s/p 4 cycles.  --Chemotherapy delayed for the last 2 weeks because of thrombocytopenia. CBC today reviewed, satisfactory absolute neutrophil count and platelet count near 100,000 however patient feels very exhausted and would rather not proceed with chemotherapy. We have discussed that there is no way to gauge the benefit of chemotherapy from 4 weekly cycles of CarboTaxol.  Encouraged her to strongly continue the radiation and follow-up with [place] in about 4 weeks.  3. 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 --She is due for mammogram, will plan to schedule once lung cancer therapy is complete --She does have a standing order for mammogram  4. Leukopenia/Anemia/Thrombocytopenia: --Secondary to chemotherapy --CBC  reviewed  today, leukopenia noted, absolute neutrophil count greater than 1500, platelet count 96,000. She understands neutropenic precautions, we have discussed about this several times  5. Oral thrush: --On nystatin mouthwash She was recommended to discuss about esophagitis as well as thick saliva with radiation oncology.  Follow up: Return to clinic in 4 weeks  No orders of the defined types were placed in this encounter.   All questions were answered. The patient knows to call the clinic with any problems, questions or concerns.  I have spent a total of 30 minutes minutes of face-to-face and non-face-to-face time, preparing to see the patient,  performing a medically appropriate examination, counseling and educating the patient, ordering medications/tests, documenting clinical information in the electronic health record,  and care coordination.   Benay Pike MD

## 2021-12-14 ENCOUNTER — Telehealth: Payer: Self-pay

## 2021-12-14 ENCOUNTER — Telehealth: Payer: Self-pay | Admitting: Radiation Oncology

## 2021-12-14 ENCOUNTER — Ambulatory Visit: Payer: Medicare HMO

## 2021-12-14 ENCOUNTER — Encounter: Payer: Self-pay | Admitting: Hematology and Oncology

## 2021-12-14 NOTE — Telephone Encounter (Signed)
Called and spoke with patient's sister Hilda Blades who confirmed she would be able to bring patient to [place] tomorrow 5/24 at 14:00 to be evaluated in [facility]

## 2021-12-14 NOTE — Telephone Encounter (Signed)
Patient called to cancel tx scheduled 5/23 due to not feeling well. L4 notified.

## 2021-12-15 ENCOUNTER — Inpatient Hospital Stay: Payer: Medicare HMO

## 2021-12-15 ENCOUNTER — Inpatient Hospital Stay: Payer: Medicare HMO | Admitting: Physician Assistant

## 2021-12-15 ENCOUNTER — Ambulatory Visit: Payer: Medicare HMO

## 2021-12-15 ENCOUNTER — Ambulatory Visit
Admission: RE | Admit: 2021-12-15 | Discharge: 2021-12-15 | Disposition: A | Discharge status: Home / Self Care | Payer: Medicare HMO | Source: Ambulatory Visit | Attending: Radiation Oncology | Admitting: Radiation Oncology

## 2021-12-15 ENCOUNTER — Ambulatory Visit: Payer: Medicare HMO | Admitting: Hematology and Oncology

## 2021-12-15 ENCOUNTER — Other Ambulatory Visit: Payer: Self-pay

## 2021-12-15 ENCOUNTER — Other Ambulatory Visit: Payer: Medicare HMO

## 2021-12-15 VITALS — BP 106/71 | HR 99 | Temp 98.3°F | Resp 16 | Ht 64.0 in | Wt 193.8 lb

## 2021-12-15 DIAGNOSIS — Z833 Family history of diabetes mellitus: Secondary | ICD-10-CM | POA: Diagnosis not present

## 2021-12-15 DIAGNOSIS — Z87891 Personal history of nicotine dependence: Secondary | ICD-10-CM | POA: Diagnosis not present

## 2021-12-15 DIAGNOSIS — Z8249 Family history of ischemic heart disease and other diseases of the circulatory system: Secondary | ICD-10-CM | POA: Diagnosis not present

## 2021-12-15 DIAGNOSIS — Z923 Personal history of irradiation: Secondary | ICD-10-CM | POA: Diagnosis not present

## 2021-12-15 DIAGNOSIS — Z170 Estrogen receptor positive status [ER+]: Secondary | ICD-10-CM | POA: Diagnosis not present

## 2021-12-15 DIAGNOSIS — C321 Malignant neoplasm of supraglottis: Secondary | ICD-10-CM | POA: Diagnosis not present

## 2021-12-15 DIAGNOSIS — Z5111 Encounter for antineoplastic chemotherapy: Secondary | ICD-10-CM | POA: Diagnosis not present

## 2021-12-15 DIAGNOSIS — B370 Candidal stomatitis: Secondary | ICD-10-CM | POA: Diagnosis not present

## 2021-12-15 DIAGNOSIS — E876 Hypokalemia: Secondary | ICD-10-CM | POA: Diagnosis not present

## 2021-12-15 DIAGNOSIS — R070 Pain in throat: Secondary | ICD-10-CM | POA: Diagnosis not present

## 2021-12-15 DIAGNOSIS — R197 Diarrhea, unspecified: Secondary | ICD-10-CM | POA: Diagnosis not present

## 2021-12-15 DIAGNOSIS — Z9221 Personal history of antineoplastic chemotherapy: Secondary | ICD-10-CM | POA: Diagnosis not present

## 2021-12-15 DIAGNOSIS — Z8041 Family history of malignant neoplasm of ovary: Secondary | ICD-10-CM | POA: Diagnosis not present

## 2021-12-15 DIAGNOSIS — Z510 Encounter for antineoplastic radiation therapy: Secondary | ICD-10-CM | POA: Diagnosis not present

## 2021-12-15 DIAGNOSIS — R110 Nausea: Secondary | ICD-10-CM | POA: Diagnosis not present

## 2021-12-15 DIAGNOSIS — D696 Thrombocytopenia, unspecified: Secondary | ICD-10-CM | POA: Diagnosis not present

## 2021-12-15 DIAGNOSIS — D649 Anemia, unspecified: Secondary | ICD-10-CM | POA: Diagnosis not present

## 2021-12-15 DIAGNOSIS — Z79899 Other long term (current) drug therapy: Secondary | ICD-10-CM

## 2021-12-15 DIAGNOSIS — C3432 Malignant neoplasm of lower lobe, left bronchus or lung: Secondary | ICD-10-CM | POA: Diagnosis not present

## 2021-12-15 DIAGNOSIS — Z806 Family history of leukemia: Secondary | ICD-10-CM | POA: Diagnosis not present

## 2021-12-15 DIAGNOSIS — R5383 Other fatigue: Secondary | ICD-10-CM | POA: Diagnosis not present

## 2021-12-15 DIAGNOSIS — Z800 Family history of malignant neoplasm of digestive organs: Secondary | ICD-10-CM | POA: Diagnosis not present

## 2021-12-15 DIAGNOSIS — R0602 Shortness of breath: Secondary | ICD-10-CM | POA: Diagnosis not present

## 2021-12-15 DIAGNOSIS — C320 Malignant neoplasm of glottis: Secondary | ICD-10-CM | POA: Diagnosis not present

## 2021-12-15 DIAGNOSIS — M8580 Other specified disorders of bone density and structure, unspecified site: Secondary | ICD-10-CM

## 2021-12-15 DIAGNOSIS — Z818 Family history of other mental and behavioral disorders: Secondary | ICD-10-CM | POA: Diagnosis not present

## 2021-12-15 DIAGNOSIS — C50412 Malignant neoplasm of upper-outer quadrant of left female breast: Secondary | ICD-10-CM

## 2021-12-15 LAB — RAD ONC ARIA SESSION SUMMARY
Course Elapsed Days: 42
Plan Fractions Treated to Date: 29
Plan Prescribed Dose Per Fraction: 2 Gy
Plan Total Fractions Prescribed: 30
Plan Total Prescribed Dose: 60 Gy
Reference Point Dosage Given to Date: 58 Gy
Reference Point Session Dosage Given: 2 Gy
Session Number: 29

## 2021-12-15 MED ORDER — SODIUM CHLORIDE 0.9 % IV SOLN: Freq: Once | INTRAVENOUS | Status: AC

## 2021-12-15 MED ORDER — HEPARIN SOD (PORK) LOCK FLUSH 100 UNIT/ML IV SOLN
500.0000 [IU] | Freq: Once | INTRAVENOUS | Status: AC | PRN
  Administered 2021-12-15: 500 [IU]

## 2021-12-15 MED ORDER — SODIUM CHLORIDE 0.9% FLUSH
10.0000 mL | Freq: Once | INTRAVENOUS | Status: AC | PRN
  Administered 2021-12-15: 10 mL

## 2021-12-16 ENCOUNTER — Other Ambulatory Visit: Payer: Self-pay

## 2021-12-16 ENCOUNTER — Ambulatory Visit
Admission: RE | Admit: 2021-12-16 | Discharge: 2021-12-16 | Disposition: A | Discharge status: Home / Self Care | Payer: Medicare HMO | Source: Ambulatory Visit | Attending: Radiation Oncology | Admitting: Radiation Oncology

## 2021-12-16 ENCOUNTER — Encounter: Payer: Self-pay | Admitting: Radiation Oncology

## 2021-12-16 DIAGNOSIS — Z510 Encounter for antineoplastic radiation therapy: Secondary | ICD-10-CM | POA: Diagnosis not present

## 2021-12-16 DIAGNOSIS — C320 Malignant neoplasm of glottis: Secondary | ICD-10-CM | POA: Diagnosis not present

## 2021-12-16 DIAGNOSIS — C3432 Malignant neoplasm of lower lobe, left bronchus or lung: Secondary | ICD-10-CM | POA: Diagnosis not present

## 2021-12-16 DIAGNOSIS — Z87891 Personal history of nicotine dependence: Secondary | ICD-10-CM | POA: Diagnosis not present

## 2021-12-16 LAB — RAD ONC ARIA SESSION SUMMARY
Course Elapsed Days: 43
Plan Fractions Treated to Date: 30
Plan Prescribed Dose Per Fraction: 2 Gy
Plan Total Fractions Prescribed: 30
Plan Total Prescribed Dose: 60 Gy
Reference Point Dosage Given to Date: 60 Gy
Reference Point Session Dosage Given: 2 Gy
Session Number: 30

## 2021-12-17 ENCOUNTER — Inpatient Hospital Stay: Payer: Medicare HMO

## 2021-12-21 ENCOUNTER — Other Ambulatory Visit: Payer: Self-pay | Admitting: Hematology and Oncology

## 2021-12-21 ENCOUNTER — Telehealth: Payer: Self-pay | Admitting: *Deleted

## 2021-12-21 MED ORDER — HYDROCODONE-ACETAMINOPHEN 7.5-325 MG/15ML PO SOLN: 10.0000 mL | Freq: Two times a day (BID) | ORAL | 0 refills | Status: DC | PRN

## 2021-12-21 NOTE — Telephone Encounter (Signed)
This RN spoke with pt per message left by her sister ( due to [place] not able to speak above a whisper ).  Overall symptoms are not worse - she is able to drink well including higher calorie/protein liquids.  She denies any need presently for IV fluids.  She is using hydrocodone/apap elixer with benefit and needs a refill.  No other needs at this time.

## 2021-12-21 NOTE — Progress Notes (Signed)
Hydrocodone refill sent per request  Jaaziah Schulke

## 2021-12-23 ENCOUNTER — Telehealth: Payer: Self-pay

## 2021-12-23 DIAGNOSIS — C321 Malignant neoplasm of supraglottis: Secondary | ICD-10-CM

## 2021-12-23 MED ORDER — LIDOCAINE VISCOUS HCL 2 % MT SOLN: OROMUCOSAL | 3 refills | Status: DC

## 2021-12-23 NOTE — Telephone Encounter (Signed)
Received call from pt asking if we can call phx to expedite refill for liquid norco. Advised pt we have no control over that, and if she is in pain they may let her refill early but she would have to pay OOP. Pt asks for refill on lidocaine elixir. Refill sent. Pt knows to call for any further questions/concerns.

## 2021-12-29 ENCOUNTER — Inpatient Hospital Stay (HOSPITAL_BASED_OUTPATIENT_CLINIC_OR_DEPARTMENT_OTHER): Payer: Medicare HMO | Admitting: Physician Assistant

## 2021-12-29 ENCOUNTER — Other Ambulatory Visit: Payer: Self-pay

## 2021-12-29 ENCOUNTER — Telehealth: Payer: Self-pay | Admitting: *Deleted

## 2021-12-29 ENCOUNTER — Inpatient Hospital Stay: Payer: Medicare HMO

## 2021-12-29 ENCOUNTER — Inpatient Hospital Stay: Payer: Medicare HMO | Attending: Hematology and Oncology

## 2021-12-29 ENCOUNTER — Other Ambulatory Visit: Payer: Self-pay | Admitting: *Deleted

## 2021-12-29 VITALS — BP 114/70 | HR 73 | Temp 98.1°F | Resp 18

## 2021-12-29 DIAGNOSIS — R3911 Hesitancy of micturition: Secondary | ICD-10-CM

## 2021-12-29 DIAGNOSIS — J449 Chronic obstructive pulmonary disease, unspecified: Secondary | ICD-10-CM | POA: Insufficient documentation

## 2021-12-29 DIAGNOSIS — E876 Hypokalemia: Secondary | ICD-10-CM

## 2021-12-29 DIAGNOSIS — Z806 Family history of leukemia: Secondary | ICD-10-CM | POA: Insufficient documentation

## 2021-12-29 DIAGNOSIS — R070 Pain in throat: Secondary | ICD-10-CM

## 2021-12-29 DIAGNOSIS — R109 Unspecified abdominal pain: Secondary | ICD-10-CM | POA: Diagnosis not present

## 2021-12-29 DIAGNOSIS — Z87891 Personal history of nicotine dependence: Secondary | ICD-10-CM | POA: Insufficient documentation

## 2021-12-29 DIAGNOSIS — Z8041 Family history of malignant neoplasm of ovary: Secondary | ICD-10-CM | POA: Diagnosis not present

## 2021-12-29 DIAGNOSIS — Z79899 Other long term (current) drug therapy: Secondary | ICD-10-CM | POA: Diagnosis not present

## 2021-12-29 DIAGNOSIS — Z808 Family history of malignant neoplasm of other organs or systems: Secondary | ICD-10-CM | POA: Diagnosis not present

## 2021-12-29 DIAGNOSIS — C3490 Malignant neoplasm of unspecified part of unspecified bronchus or lung: Secondary | ICD-10-CM

## 2021-12-29 DIAGNOSIS — C321 Malignant neoplasm of supraglottis: Secondary | ICD-10-CM | POA: Diagnosis not present

## 2021-12-29 DIAGNOSIS — Z95828 Presence of other vascular implants and grafts: Secondary | ICD-10-CM

## 2021-12-29 DIAGNOSIS — Z8051 Family history of malignant neoplasm of kidney: Secondary | ICD-10-CM | POA: Insufficient documentation

## 2021-12-29 DIAGNOSIS — K5900 Constipation, unspecified: Secondary | ICD-10-CM | POA: Diagnosis not present

## 2021-12-29 DIAGNOSIS — Z8719 Personal history of other diseases of the digestive system: Secondary | ICD-10-CM | POA: Diagnosis not present

## 2021-12-29 DIAGNOSIS — Z170 Estrogen receptor positive status [ER+]: Secondary | ICD-10-CM

## 2021-12-29 DIAGNOSIS — C50412 Malignant neoplasm of upper-outer quadrant of left female breast: Secondary | ICD-10-CM

## 2021-12-29 DIAGNOSIS — M8580 Other specified disorders of bone density and structure, unspecified site: Secondary | ICD-10-CM

## 2021-12-29 DIAGNOSIS — C3432 Malignant neoplasm of lower lobe, left bronchus or lung: Secondary | ICD-10-CM | POA: Diagnosis not present

## 2021-12-29 DIAGNOSIS — Z9221 Personal history of antineoplastic chemotherapy: Secondary | ICD-10-CM | POA: Diagnosis not present

## 2021-12-29 DIAGNOSIS — E860 Dehydration: Secondary | ICD-10-CM | POA: Insufficient documentation

## 2021-12-29 DIAGNOSIS — Z8249 Family history of ischemic heart disease and other diseases of the circulatory system: Secondary | ICD-10-CM | POA: Insufficient documentation

## 2021-12-29 DIAGNOSIS — Z5111 Encounter for antineoplastic chemotherapy: Secondary | ICD-10-CM | POA: Diagnosis not present

## 2021-12-29 DIAGNOSIS — Z923 Personal history of irradiation: Secondary | ICD-10-CM | POA: Diagnosis not present

## 2021-12-29 DIAGNOSIS — Z818 Family history of other mental and behavioral disorders: Secondary | ICD-10-CM | POA: Insufficient documentation

## 2021-12-29 DIAGNOSIS — R531 Weakness: Secondary | ICD-10-CM | POA: Insufficient documentation

## 2021-12-29 DIAGNOSIS — D72819 Decreased white blood cell count, unspecified: Secondary | ICD-10-CM | POA: Insufficient documentation

## 2021-12-29 DIAGNOSIS — Z79811 Long term (current) use of aromatase inhibitors: Secondary | ICD-10-CM | POA: Diagnosis not present

## 2021-12-29 DIAGNOSIS — Z800 Family history of malignant neoplasm of digestive organs: Secondary | ICD-10-CM | POA: Insufficient documentation

## 2021-12-29 DIAGNOSIS — R5383 Other fatigue: Secondary | ICD-10-CM

## 2021-12-29 DIAGNOSIS — Z836 Family history of other diseases of the respiratory system: Secondary | ICD-10-CM | POA: Insufficient documentation

## 2021-12-29 DIAGNOSIS — Z833 Family history of diabetes mellitus: Secondary | ICD-10-CM | POA: Insufficient documentation

## 2021-12-29 DIAGNOSIS — Z809 Family history of malignant neoplasm, unspecified: Secondary | ICD-10-CM | POA: Insufficient documentation

## 2021-12-29 DIAGNOSIS — Z9049 Acquired absence of other specified parts of digestive tract: Secondary | ICD-10-CM | POA: Diagnosis not present

## 2021-12-29 LAB — CBC WITH DIFFERENTIAL/PLATELET
Abs Immature Granulocytes: 0.02 10*3/uL (ref 0.00–0.07)
Basophils Absolute: 0 10*3/uL (ref 0.0–0.1)
Basophils Relative: 0 %
Eosinophils Absolute: 0 10*3/uL (ref 0.0–0.5)
Eosinophils Relative: 1 %
HCT: 30.8 % — ABNORMAL LOW (ref 36.0–46.0)
Hemoglobin: 10.7 g/dL — ABNORMAL LOW (ref 12.0–15.0)
Immature Granulocytes: 1 %
Lymphocytes Relative: 16 %
Lymphs Abs: 0.6 10*3/uL — ABNORMAL LOW (ref 0.7–4.0)
MCH: 33.4 pg (ref 26.0–34.0)
MCHC: 34.7 g/dL (ref 30.0–36.0)
MCV: 96.3 fL (ref 80.0–100.0)
Monocytes Absolute: 0.4 10*3/uL (ref 0.1–1.0)
Monocytes Relative: 11 %
Neutro Abs: 2.5 10*3/uL (ref 1.7–7.7)
Neutrophils Relative %: 71 %
Platelets: 193 10*3/uL (ref 150–400)
RBC: 3.2 MIL/uL — ABNORMAL LOW (ref 3.87–5.11)
RDW: 19.3 % — ABNORMAL HIGH (ref 11.5–15.5)
WBC: 3.5 10*3/uL — ABNORMAL LOW (ref 4.0–10.5)
nRBC: 0 % (ref 0.0–0.2)

## 2021-12-29 LAB — URINALYSIS, COMPLETE (UACMP) WITH MICROSCOPIC
Bacteria, UA: NONE SEEN
Bilirubin Urine: NEGATIVE
Glucose, UA: NEGATIVE mg/dL
Hgb urine dipstick: NEGATIVE
Ketones, ur: NEGATIVE mg/dL
Nitrite: NEGATIVE
Protein, ur: NEGATIVE mg/dL
Specific Gravity, Urine: 1.014 (ref 1.005–1.030)
pH: 8 (ref 5.0–8.0)

## 2021-12-29 LAB — COMPREHENSIVE METABOLIC PANEL
ALT: 11 U/L (ref 0–44)
AST: 14 U/L — ABNORMAL LOW (ref 15–41)
Albumin: 3.5 g/dL (ref 3.5–5.0)
Alkaline Phosphatase: 67 U/L (ref 38–126)
Anion gap: 9 (ref 5–15)
BUN: 19 mg/dL (ref 8–23)
CO2: 34 mmol/L — ABNORMAL HIGH (ref 22–32)
Calcium: 9.9 mg/dL (ref 8.9–10.3)
Chloride: 97 mmol/L — ABNORMAL LOW (ref 98–111)
Creatinine, Ser: 0.67 mg/dL (ref 0.44–1.00)
GFR, Estimated: >60 mL/min (ref >60)
Glucose, Bld: 109 mg/dL — ABNORMAL HIGH (ref 70–99)
Potassium: 2.6 mmol/L — CL (ref 3.5–5.1)
Sodium: 140 mmol/L (ref 135–145)
Total Bilirubin: 1.8 mg/dL — ABNORMAL HIGH (ref 0.3–1.2)
Total Protein: 7.1 g/dL (ref 6.5–8.1)

## 2021-12-29 LAB — MAGNESIUM: Magnesium: 2 mg/dL (ref 1.7–2.4)

## 2021-12-29 LAB — SAMPLE TO BLOOD BANK

## 2021-12-29 MED ORDER — POTASSIUM CHLORIDE 20 MEQ PO PACK: 20.0000 meq | PACK | Freq: Two times a day (BID) | ORAL | 0 refills | Status: DC

## 2021-12-29 MED ORDER — HEPARIN SOD (PORK) LOCK FLUSH 100 UNIT/ML IV SOLN
500.0000 [IU] | Freq: Once | INTRAVENOUS | Status: AC | PRN
  Administered 2021-12-29: 500 [IU]

## 2021-12-29 MED ORDER — CEPHALEXIN 500 MG PO CAPS: 500.0000 mg | ORAL_CAPSULE | Freq: Two times a day (BID) | ORAL | 0 refills | Status: AC

## 2021-12-29 MED ORDER — SODIUM CHLORIDE 0.9 % IV SOLN: Freq: Once | INTRAVENOUS | Status: AC

## 2021-12-29 MED ORDER — POTASSIUM CHLORIDE 10 MEQ/100ML IV SOLN
10.0000 meq | Freq: Once | INTRAVENOUS | Status: AC
  Administered 2021-12-29: 10 meq via INTRAVENOUS
  Filled 2021-12-29: qty 100

## 2021-12-29 MED ORDER — SODIUM CHLORIDE 0.9% FLUSH
10.0000 mL | INTRAVENOUS | Status: AC | PRN
  Administered 2021-12-29: 10 mL

## 2021-12-29 MED ORDER — SODIUM CHLORIDE 0.9% FLUSH
10.0000 mL | Freq: Once | INTRAVENOUS | Status: AC | PRN
  Administered 2021-12-29: 10 mL

## 2021-12-29 MED ORDER — POTASSIUM CHLORIDE 10 MEQ/100ML IV SOLN
INTRAVENOUS | Status: AC
  Filled 2021-12-29: qty 100

## 2021-12-29 MED ORDER — POTASSIUM CHLORIDE 10 MEQ/100ML IV SOLN
10.0000 meq | Freq: Once | INTRAVENOUS | Status: AC
  Administered 2021-12-29: 10 meq via INTRAVENOUS

## 2021-12-29 NOTE — Patient Instructions (Signed)
Rehydration, Adult Rehydration is the replacement of body fluids, salts, and minerals (electrolytes) that are lost during dehydration. Dehydration is when there is not enough water or other fluids in the body. This happens when you lose more fluids than you take in. Common causes of dehydration include: Not drinking enough fluids. This can occur when you are ill or doing activities that require a lot of energy, especially in hot weather. Conditions that cause loss of water or other fluids, such as diarrhea, vomiting, sweating, or urinating a lot. Other illnesses, such as fever or infection. Certain medicines, such as those that remove excess fluid from the body (diuretics). Symptoms of mild or moderate dehydration may include thirst, dry lips and mouth, and dizziness. Symptoms of severe dehydration may include increased heart rate, confusion, fainting, and not urinating. For severe dehydration, you may need to get fluids through an IV at the hospital. For mild or moderate dehydration, you can usually rehydrate at home by drinking certain fluids as told by your health care provider. What are the risks? Generally, rehydration is safe. However, taking in too much fluid (overhydration) can be a problem. This is rare. Overhydration can cause an electrolyte imbalance, kidney failure, or a decrease in salt (sodium) levels in the body. Supplies needed You will need an oral rehydration solution (ORS) if your health care provider tells you to use one. This is a drink to treat dehydration. It can be found in pharmacies and retail stores. How to rehydrate Fluids Follow instructions from your health care provider for rehydration. The kind of fluid and the amount you should drink depend on your condition. In general, you should choose drinks that you prefer. If told by your health care provider, drink an ORS. Make an ORS by following instructions on the package. Start by drinking small amounts, about  cup (120  mL) every 5-10 minutes. Slowly increase how much you drink until you have taken the amount recommended by your health care provider. Drink enough clear fluids to keep your urine pale yellow. If you were told to drink an ORS, finish it first, then start slowly drinking other clear fluids. Drink fluids such as: Water. This includes sparkling water and flavored water. Drinking only water can lead to having too little sodium in your body (hyponatremia). Follow the advice of your health care provider. Water from ice chips you suck on. Fruit juice with water you add to it (diluted). Sports drinks. Hot or cold herbal teas. Broth-based soups. Milk or milk products. Food Follow instructions from your health care provider about what to eat while you rehydrate. Your health care provider may recommend that you slowly begin eating regular foods in small amounts. Eat foods that contain a healthy balance of electrolytes, such as bananas, oranges, potatoes, tomatoes, and spinach. Avoid foods that are greasy or contain a lot of sugar. In some cases, you may get nutrition through a feeding tube that is passed through your nose and into your stomach (nasogastric tube, or NG tube). This may be done if you have uncontrolled vomiting or diarrhea. Beverages to avoid  Certain beverages may make dehydration worse. While you rehydrate, avoid drinking alcohol. How to tell if you are recovering from dehydration You may be recovering from dehydration if: You are urinating more often than before you started rehydrating. Your urine is pale yellow. Your energy level improves. You vomit less frequently. You have diarrhea less frequently. Your appetite improves or returns to normal. You feel less dizzy or less light-headed.   Your skin tone and color start to look more normal. Follow these instructions at home: Take over-the-counter and prescription medicines only as told by your health care provider. Do not take sodium  tablets. Doing this can lead to having too much sodium in your body (hypernatremia). Contact a health care provider if: You continue to have symptoms of mild or moderate dehydration, such as: Thirst. Dry lips. Slightly dry mouth. Dizziness. Dark urine or less urine than normal. Muscle cramps. You continue to vomit or have diarrhea. Get help right away if you: Have symptoms of dehydration that get worse. Have a fever. Have a severe headache. Have been vomiting and the following happens: Your vomiting gets worse or does not go away. Your vomit includes blood or green matter (bile). You cannot eat or drink without vomiting. Have problems with urination or bowel movements, such as: Diarrhea that gets worse or does not go away. Blood in your stool (feces). This may cause stool to look black and tarry. Not urinating, or urinating only a small amount of very dark urine, within 6-8 hours. Have trouble breathing. Have symptoms that get worse with treatment. These symptoms may represent a serious problem that is an emergency. Do not wait to see if the symptoms will go away. Get medical help right away. Call your local emergency services (911 in the [place] Do not drive yourself to the hospital. Summary Rehydration is the replacement of body fluids and minerals (electrolytes) that are lost during dehydration. Follow instructions from your health care provider for rehydration. The kind of fluid and amount you should drink depend on your condition. Slowly increase how much you drink until you have taken the amount recommended by your health care provider. Contact your health care provider if you continue to show signs of mild or moderate dehydration. This information is not intended to replace advice given to you by your health care provider. Make sure you discuss any questions you have with your health care provider. Document Revised: 09/11/2019 Document Reviewed: 07/22/2019 Else[employer] Patient  Education  2023 [employer]  

## 2021-12-29 NOTE — Progress Notes (Signed)
Symptom Management Consult note [place]    Patient Care Team: [name], MD as PCP - General (Internal Medicine) Herminio Commons, MD (Inactive) as PCP - Cardiology (Cardiology) Whitney Muse, Kelby Fam, MD (Inactive) as Consulting Physician (Hematology and Oncology) Erroll Luna, MD as Consulting Physician (General Surgery) Thea Silversmith, MD as Consulting Physician (Radiation Oncology) Sylvan Cheese, NP as Nurse Practitioner (Hematology and Oncology) Danie Binder, MD (Inactive) as Consulting Physician (Gastroenterology) Malmfelt, Stephani Police, RN as Oncology Nurse Navigator Melida Quitter, MD as Consulting Physician (Otolaryngology) Eppie Gibson, MD as Consulting Physician (Radiation Oncology) Benay Pike, MD as Consulting Physician (Hematology and Oncology)    Name of the patient: Regina Mcmahon  [id number]  [birth year]   Date of visit: 12/29/2021    Chief complaint/ Reason for visit- decreased appetite, throat pain, generalized weakness  Oncology History  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     06/24/2014 Breast US   Left breast: ill-defined predominantly hypoechoic area with some increased echogenicity in the 2 o'clock position of the left breast, 7 cm from the nipple. 2.6 x 2.3 x 1.8 cm in maximum dimensions.    07/01/2014 Initial Biopsy   Left breast core needle bx: Invasive mammary carcinoma with lobular features, ER+ (100%), PR+ (91%), HER2/neu negative (ratio 1.09), Ki-67 32%. E-cadherin strongly positive, diagnostic for IDC     07/01/2014 Clinical Stage   Stage IIA: T2 N0    08/20/2014 Definitive Surgery   Left breast seed localized lumpectomy/SLNB: IDC, +LVI, DCIS, 1 LN removed  and positive for metastatic carcinoma. Grade 2. HER2/neu repeated and negative (ratio 0.69-1.9).     08/20/2014 Pathologic Stage   Stage IIB: pT2 pN1a pMx    09/17/2014 Surgery   Port-a cath placement and re-excision    10/02/2014 - 01/15/2015 Chemotherapy   CMF x 6 cycles.  patient refused Taxane-containing chemotherapy.    03/07/2015 Procedure   Comp Cancer Panel reveals no variant at APC, ATM, AXIN2, BARD1, BMPR1A, BRCA1, BRCA2, BRIP1, CDH1, CDK4, CDKN2A, CHEK2, FANCC, MLH1, MSH2, MSH6, MUTYH, NBN, PALB2, PMS2, POLD1, POLE, PTEN, RAD51C, RAD51D, SMAD4, STK11, TP53, VHL, and XRCC2.     04/06/2015 - 05/21/2015 Radiation Therapy   Adjuvant RT Pablo Ledger): Left breast/ 45 Gy over 25 fractions.   Left supraclavicular fossa and axilla/ 45 Gy over 25 fractions. Left breast boost/ 16 Gy over 8 fractions. Total dose: 61 Gy    06/04/2015 - 08/07/2015 Anti-estrogen oral therapy   Exemestane 25 mg daily.  Planned duration of therapy 5 years.     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  PET also demonstrated borderline right level 2 lymph node left supraclavicular, mediastinal and hilar lymphadenopathy and  hypermetabolic left lower lobe pulmonary nodule all suspicious of metastatic focus.  No abdominal pelvic metastatic disease or osseous metastatic disease was appreciated.   08/31/2021 Initial Diagnosis   Squamous cell carcinoma of lung ([place])    09/08/2021 Cancer Staging   Staging form: Lung, AJCC 8th Edition - Clinical stage from 09/08/2021: Stage IIIB (cT1b, cN3, cM0) - Signed by Eppie Gibson, MD on 09/08/2021 Stage prefix: Initial diagnosis    09/30/2021 Pathology Results   [facility] 360 Pathology PDL 1 29%   11/01/2021 -  Chemotherapy   Patient is on Treatment Plan : LUNG Carboplatin / Paclitaxel + XRT q7d        Current Therapy: carboplatin, paclitaxel Day 1 Cycle 4 on 11/23/21  Interval history- Regina Mcmahon is a 71 y.o. with oncologic history as above presenting to [facility] today with chief complaint of decreased appetite, throat pain, generalized weakness x 2 weeks. She tells me that symptoms are intermittent and that her last chemo was scheduled for 12/13/21 however was held secondary to patient feeling poorly. She is also endorsing nausea which is typically worst in the morning. Nausea improves with zofran and compazine. She was able to eat an egg this morning. She tries to stay hydrated however her PO intake at home over the last several days has been extremely low.  She describes the throat pain as feeling like an irritation when swallowing. She states the pain is worse when trying to eat. She rates pain 3/10 in severity. She was prescribed lidocaine solution for throat pain however was unable to pick it up from the pharmacy yet because it was not in stock. She has been taking Hycet for severe pain only as she does not want to become dependent on it. She admits her pain is typically well controlled with PRN use. She still has some soreness in her chest where she had the radiation therapy. Final radiation treatment was 12/16/21. She admits to struggling with constipation for which she takes  Miralax. Her last bowel movement was yesterday. She does also endorse intermittent abdominal pain that she describes as a cramping feeling. Denies any abdominal pain currently. She has had some urinary hesitancy and does not feel as if she is fully emptying her bladder.  She denies any fever, chills, chest pain, shortness of breath, syncope, hemoptysis, emesis, back pain.    ROS  All other systems are reviewed and are negative for acute change except as noted in the HPI.    No Known Allergies   Past Medical History:  Diagnosis Date   Arthritis    Asthma    Asymptomatic varicose veins    Breast cancer ([place]) 06/2014   left   Cancer ([place])    Supra Glottis   Chronic airway obstruction ([place])    Complication of anesthesia 2016   difficulty waking up, Oxygen dropped low.   COPD (chronic obstructive pulmonary disease) ([facility])    Depression    Dyspnea    GERD (gastroesophageal reflux disease)    Hemorrhage rectum    and anus.   Hypertension    Insomnia    Other symptoms involving digestive system(787.99)    Pain    Hx.pain in joint involving pelvic region and  thigh; pain in limb   Palpitations    Panic attacks    Personal history of chemotherapy 2016   Personal history of radiation therapy 2016   Pneumonia    Pre-diabetes    Prediabetes    Sinusitis    Symptomatic menopausal or female climacteric states    Varicose veins of both lower extremities with pain      Past Surgical History:  Procedure Laterality Date   BREAST BIOPSY Left 10/2012   BREAST BIOPSY Left 07/01/2014   malignant   BREAST LUMPECTOMY Left 08/20/2014   BRONCHIAL BIOPSY  08/23/2021   Procedure: BRONCHIAL BIOPSIES;  Surgeon: Collene Gobble, MD;  Location: [facility] ENDOS[facility];  Service: Pulmonary;;   BRONCHIAL BRUSHINGS  08/23/2021   Procedure: BRONCHIAL BRUSHINGS;  Surgeon: Collene Gobble, MD;  Location: [facility] ENDOSCOPY;  Service: Pulmonary;;   BRONCHIAL NEEDLE ASPIRATION BIOPSY  08/23/2021   Procedure: BRONCHIAL  NEEDLE ASPIRATION BIOPSIES;  Surgeon: Collene Gobble, MD;  Location: [place];  Service: Pulmonary;;   BRONCHIAL WASHINGS  08/23/2021   Procedure: BRONCHIAL WASHINGS;  Surgeon: Collene Gobble, MD;  Location: [facility] ENDOSCOPY;  Service: Pulmonary;;   CESAREAN SECTION     CHOLECYSTECTOMY  1985   COLONOSCOPY  2002   Dr. Lindalou Hose: internal hemorrhoids, one small rectal polyp, adenomatous path    COLONOSCOPY N/A 11/23/2015   Procedure: COLONOSCOPY;  Surgeon: Danie Binder, MD;  Location: [facility];  Service: Endoscopy;  Laterality: N/A;  1100 - moved to 10:45 - office to notify   ESOPHAGOGASTRODUODENOSCOPY N/A 11/23/2015   Procedure: ESOPHAGOGASTRODUODENOSCOPY (EGD);  Surgeon: Danie Binder, MD;  Location: [facility];  Service: Endoscopy;  Laterality: N/A;   IR IMAGING GUIDED PORT INSERTION  11/08/2021   PORT-A-CATH REMOVAL  02/2015   PORTACATH PLACEMENT Right 09/17/2014   Procedure: INSERTION PORT-A-CATH WITH ULTRASOUND;  Surgeon: Erroll Luna, MD;  Location: [place];  Service: General;  Laterality: Right;  IJ   RADIOACTIVE SEED GUIDED PARTIAL MASTECTOMY WITH AXILLARY SENTINEL LYMPH NODE BIOPSY Left 08/20/2014   Procedure: LEFT BREAST LUMPECTOMY WITH RADIOACTIVE SEED LOCALIZATION AND SENTINEL LYMPH NODE MAPPING;  Surgeon: Erroll Luna, MD;  Location: [place];  Service: General;  Laterality: Left;   RE-EXCISION OF BREAST LUMPECTOMY Left 09/17/2014   Procedure: RE-EXCISION OF BREAST LUMPECTOMY;  Surgeon: Erroll Luna, MD;  Location: [place];  Service: General;  Laterality: Left;   TOTAL HIP ARTHROPLASTY Right 02/22/2021   Procedure: RIGHT TOTAL HIP ARTHROPLASTY ANTERIOR APPROACH;  Surgeon: Marybelle Killings, MD;  Location: [place];  Service: Orthopedics;  Laterality: Right;   TUBAL LIGATION     VIDEO BRONCHOSCOPY WITH ENDOBRONCHIAL ULTRASOUND N/A 08/23/2021   Procedure: VIDEO BRONCHOSCOPY WITH ENDOBRONCHIAL ULTRASOUND;  Surgeon: Collene Gobble,  MD;  Location: [place] ENDOSCOPY;  Service: Pulmonary;  Laterality: N/A;   VIDEO BRONCHOSCOPY WITH RADIAL ENDOBRONCHIAL ULTRASOUND  08/23/2021   Procedure: VIDEO BRONCHOSCOPY WITH RADIAL ENDOBRONCHIAL ULTRASOUND;  Surgeon: Collene Gobble, MD;  Location: [facility];  Service: Pulmonary;;    Social History   Socioeconomic History   Marital status: Divorced    Spouse name: Not on file   Number of children: 2   Years of education: Not on file   Highest education level: Not on file  Occupational History   Not on file  Tobacco Use   Smoking status: Former    Packs/day: 1.00    Years: 42.00    Pack years: 42.00    Types: Cigarettes  Quit date: 02/22/2021    Years since quitting: 0.8   Smokeless tobacco: Never  Vaping Use   Vaping Use: Never used  Substance and Sexual Activity   Alcohol use: No    Alcohol/week: 0.0 standard drinks   Drug use: No   Sexual activity: Never    Birth control/protection: None  Other Topics Concern   Not on file  Social History Narrative   Not on file   Social Determinants of Health   Financial Resource Strain: Not on file  Food Insecurity: Not on file  Transportation Needs: Not on file  Physical Activity: Not on file  Stress: Not on file  Social Connections: Unknown   Frequency of Communication with Friends and Family: More than three times a week   Frequency of Social Gatherings with Friends and Family: More than three times a week   Attends Religious Services: Not on Electrical engineer or Organizations: Not on file   Attends Archivist Meetings: Not on file   Marital Status: Not on file  Intimate Partner Violence: Not on file    Family History  Problem Relation Age of Onset   Diabetes Mother    Ovarian cancer Mother 31   Other Father        died in MVA at age 38   Other Sister        mitral valve disorder   Melanoma Sister 80       removed from back of leg   Colon cancer Brother        early 5s, succumbed to  disease   Cancer Paternal Aunt        leukemia, bone, or lung cancer   Alzheimer's disease Maternal Grandmother    Heart attack Maternal Grandfather    Heart attack Paternal Grandmother    COPD Paternal Grandfather    Emphysema Paternal Grandfather    Congestive Heart Failure Paternal Aunt    Stomach cancer Paternal Uncle    Kidney cancer Maternal Uncle    Cancer Cousin        dx. teens/dx. 52s   Cancer Cousin    Colon cancer Cousin      Current Outpatient Medications:    cephALEXin (KEFLEX) 500 MG capsule, Take 1 capsule (500 mg total) by mouth 2 (two) times daily for 5 days., Disp: 10 capsule, Rfl: 0   potassium chloride (KLOR-CON) 20 MEQ packet, Take 20 mEq by mouth 2 (two) times daily for 15 days. Dissolve in at least 5 ounces cold water or other beverage and take after meals, Disp: 30 packet, Rfl: 0   albuterol (PROVENTIL HFA;VENTOLIN HFA) 108 (90 BASE) MCG/ACT inhaler, Inhale 2 puffs into the lungs every 6 (six) hours as needed for wheezing or shortness of breath. , Disp: , Rfl:    ALPRAZolam (XANAX) 0.5 MG tablet, Take 0.5 mg by mouth 3 (three) times daily., Disp: , Rfl:    ANORO ELLIPTA 62.5-25 MCG/INH AEPB, Inhale 1 puff into the lungs daily., Disp: , Rfl:    aspirin EC 325 MG EC tablet, Take 1 tablet (325 mg total) by mouth daily with breakfast. Must take at least 4 weeks postop for DVT prophylaxis, Disp: 30 tablet, Rfl: 0   calcium carbonate (TUMS - DOSED IN MG ELEMENTAL CALCIUM) 500 MG chewable tablet, Chew 2 tablets by mouth 3 (three) times daily as needed for indigestion or heartburn., Disp: , Rfl:    fluconazole (DIFLUCAN) 100 MG tablet, Take 2 tablets today,  then 1 tablet daily x 20 more days., Disp: 22 tablet, Rfl: 0   furosemide (LASIX) 20 MG tablet, Take 20 mg by mouth daily., Disp: , Rfl:    hydrochlorothiazide (HYDRODIURIL) 12.5 MG tablet, Take 12.5 mg by mouth daily., Disp: , Rfl:    HYDROcodone-acetaminophen (HYCET) 7.5-325 mg/15 ml solution, Take 10 mLs by mouth  every 12 (twelve) hours as needed for moderate pain., Disp: 473 mL, Rfl: 0   ibuprofen (ADVIL) 800 MG tablet, Take 800 mg by mouth every 8 (eight) hours as needed for moderate pain., Disp: , Rfl:    lidocaine (XYLOCAINE) 2 % solution, Patient: Mix 1part 2% viscous lidocaine, 1part H20. Swallow 39m of diluted mixture, 362m before meals and at bedtime, up to QID for sore throat., Disp: 200 mL, Rfl: 3   lidocaine-prilocaine (EMLA) cream, Apply 1 application. topically as needed., Disp: 30 g, Rfl: 0   Menthol, Topical Analgesic, (BIOFREEZE EX), Apply 1 application topically daily as needed (pain)., Disp: , Rfl:    nystatin (MYCOSTATIN) 100000 UNIT/ML suspension, Take 5 mLs (500,000 Units total) by mouth 4 (four) times daily., Disp: 60 mL, Rfl: 0   ondansetron (ZOFRAN) 8 MG tablet, Take 1 tablet (8 mg total) by mouth every 8 (eight) hours as needed for nausea or vomiting., Disp: 60 tablet, Rfl: 0   pantoprazole (PROTONIX) 40 MG tablet, Take 1 tablet by mouth once daily, Disp: 90 tablet, Rfl: 0   prochlorperazine (COMPAZINE) 10 MG tablet, Take 1 tablet (10 mg total) by mouth every 6 (six) hours as needed for nausea or vomiting., Disp: 60 tablet, Rfl: 0   sertraline (ZOLOFT) 100 MG tablet, Take 100 mg by mouth daily., Disp: , Rfl:    sucralfate (CARAFATE) 1 g tablet, Dissolve 1 tablet in 10 mL H20 and swallow 30 min prior to meals and bedtime., Disp: 40 tablet, Rfl: 3 No current facility-administered medications for this visit.  Facility-Administered Medications Ordered in Other Visits:    heparin lock flush 100 unit/mL, 500 Units, Intracatheter, Once PRN, Iruku, Praveena, MD   sodium chloride flush (NS) 0.9 % injection 10 mL, 10 mL, Intracatheter, Once PRN, Iruku, Praveena, MD  PHYSICAL EXAM: ECOG FS:1 - Symptomatic but completely ambulatory    T: 98.1   BP: 114/70   HR: 73   Resp:18   O2:96%  Physical Exam Vitals and nursing note reviewed.  Constitutional:      Appearance: She is  well-developed. She is not ill-appearing or toxic-appearing.  HENT:     Head: Normocephalic and atraumatic.     Nose: Nose normal.     Mouth/Throat:     Mouth: Mucous membranes are dry.  Eyes:     General: No scleral icterus.       Right eye: No discharge.        Left eye: No discharge.     Conjunctiva/sclera: Conjunctivae normal.  Neck:     Vascular: No JVD.  Cardiovascular:     Rate and Rhythm: Normal rate and regular rhythm.     Pulses: Normal pulses.     Heart sounds: Normal heart sounds.  Pulmonary:     Effort: Pulmonary effort is normal.     Breath sounds: Normal breath sounds.  Chest:     Comments: Port without signs of infection Abdominal:     General: There is no distension.     Palpations: Abdomen is soft. There is no mass.     Tenderness: There is no abdominal tenderness. There is no guarding  or rebound.     Hernia: No hernia is present.  Musculoskeletal:        General: Normal range of motion.     Cervical back: Normal range of motion.     Right lower leg: No edema.     Left lower leg: No edema.  Skin:    General: Skin is warm and dry.  Neurological:     Mental Status: She is oriented to person, place, and time.     GCS: GCS eye subscore is 4. GCS verbal subscore is 5. GCS motor subscore is 6.     Comments: Fluent speech, no facial droop.  Psychiatric:        Behavior: Behavior normal.       LABORATORY DATA: I have reviewed the data as listed    Latest Ref Rng & Units 12/29/2021    1:25 PM 12/13/2021   11:17 AM 12/06/2021   11:46 AM  CBC  WBC 4.0 - 10.5 K/uL 3.5   2.7   2.4    Hemoglobin 12.0 - 15.0 g/dL 10.7   9.7   10.5    Hematocrit 36.0 - 46.0 % 30.8   27.5   29.2    Platelets 150 - 400 K/uL 193   96   54          Latest Ref Rng & Units 12/29/2021    1:25 PM 12/13/2021   11:17 AM 12/06/2021   11:46 AM  CMP  Glucose 70 - 99 mg/dL 109   126   131    BUN 8 - 23 mg/dL _0 Creatinine 0.44 - 1.00 mg/dL 0.67   0.76   0.77    Sodium 135  - 145 mmol/L 140   140   139    Potassium 3.5 - 5.1 mmol/L 2.6   3.3   3.4    Chloride 98 - 111 mmol/L 97   100   100    CO2 22 - 32 mmol/L 34   35   34    Calcium 8.9 - 10.3 mg/dL 9.9   9.6   9.6    Total Protein 6.5 - 8.1 g/dL 7.1   6.9   6.6    Total Bilirubin 0.3 - 1.2 mg/dL 1.8   2.8   3.0    Alkaline Phos 38 - 126 U/L 67   72   73    AST 15 - 41 U/L _1 ALT 0 - 44 U/L _2 RADIOGRAPHIC STUDIES: I have personally reviewed the radiological images as listed and agreed with the findings in the report. No images are attached to the encounter. No results found.   ASSESSMENT & PLAN: Patient is a 71 y.o. 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 Chryl Heck.  I have viewed most recent oncology note and lab work.   #)Decreased PO intake- Patient afebrile, nontoxic appearing. Patient clinically appears dehydrated with dry mucus membranes. Patient given 1L IVF. She declined need for anti-emetic. CBC with leukopenia similar to recent labs, hemoglobin today 10.7 is consistent with baseline, ANC WNL, and normal platelet count.   #)Hypokalemia- Potassium today is 2.6. Patient 2 runs of IV here in clinic. She  has a prescription for PO potassium however is unable to swallow the pills. She is willing to try potassium packets in the hopes she can better tolerate it at home. Prescription sent to her pharmacy for this. Potassium can be rechecked at next toxicity appointment.  #)Throat pain- No infectious symptoms. She is tolerating PO intake. I spoke with her pharmacy to verify they had the Lidocaine prescription filled and is ready for her to pick up. She plans on getting it today so she can manage pain at home.  #)Abdominal pain- Nontender abdomen, no peritoneal signs. No CVA tenderness, She has vague urinary symptoms so UA collected with  concern for infection. UA shows small leukocytes, 21-50 WBC.  We will send urine culture.  Engaged in shared decision making with patient and she is agreeable for trial of Keflex for urinary symptoms.  Strict ED precautions discussed should symptoms worsen.  Visit Diagnosis: 1. Urinary hesitancy   2. Hypokalemia   3. 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 Carcinoma of upper-outer quadrant of left breast in female, estrogen receptor positive ([place])   5. Throat pain      Orders Placed This Encounter  Procedures   Culture, Urine    Standing Status:   Future    Number of Occurrences:   1    Standing Expiration Date:   12/29/2022   Magnesium    Standing Status:   Future    Number of Occurrences:   1    Standing Expiration Date:   12/29/2022    All questions were answered. The patient knows to call the clinic with any problems, questions or concerns. No barriers to learning was detected.  I have spent a total of 30 minutes minutes of face-to-face and non-face-to-face time, preparing to see the patient, obtaining and/or reviewing separately obtained history, performing a medically appropriate examination, counseling and educating the patient, ordering tests,  documenting clinical information in the electronic health record, and care coordination.     Thank you for allowing me to participate in the care of this patient.    Barrie Folk, PA-C Department of Hematology/Oncology [facility] Phone: [phone]  [phone]    12/29/2021 5:31 PM

## 2021-12-29 NOTE — Patient Instructions (Signed)
The lidocaine prescription is ready at [facility], I verified this with the pharmacist. This should help with the irritation in your throat.

## 2021-12-29 NOTE — Progress Notes (Signed)
CRITICAL VALUE:  POTASSIUM: 2.6  CALLED TO READ BACK AND VERIFIED WITH VAL DODD, RN @ [id number] ON 06.07.2023 BY Lawana Pai, MLS

## 2021-12-29 NOTE — Telephone Encounter (Signed)
This RN spoke with the pt's sister- Hilda Blades- due to pt not able to speak well due to throat discomfort.  She states Regina Mcmahon is feeling very poorly with inability to eat- with noted throat discomfort " feeling like there is in her throat".  She is attempting to drink fluids but is starting to feel very weak and concerned.  Note Ikey's therapy for her 3rd cancer diagnosis has been held x 2 weeks due to her functional status.  Per above and communication with [facility] provider- appt made for today.  Pt's sister aware of time.

## 2021-12-30 ENCOUNTER — Telehealth: Payer: Self-pay

## 2021-12-30 ENCOUNTER — Other Ambulatory Visit: Payer: Self-pay

## 2021-12-30 ENCOUNTER — Encounter: Payer: Self-pay | Admitting: Hematology and Oncology

## 2021-12-30 NOTE — Telephone Encounter (Signed)
Attempted to reach out to patient to check-in on patient's status following receiving fluids and IV potassium yesterday. No answer. Left voicemail for patient to call back if she needed anything. Callback number provided.

## 2021-12-31 ENCOUNTER — Telehealth: Payer: Self-pay

## 2021-12-31 LAB — URINE CULTURE: Culture: <10000 — AB

## 2021-12-31 NOTE — Telephone Encounter (Signed)
This RN called patient to follow up that she was able to pick up potassium prescription at pharmacy. No answer, voicemail left.

## 2022-01-10 ENCOUNTER — Encounter: Payer: Self-pay | Admitting: Hematology and Oncology

## 2022-01-13 ENCOUNTER — Inpatient Hospital Stay (HOSPITAL_BASED_OUTPATIENT_CLINIC_OR_DEPARTMENT_OTHER): Payer: Medicare HMO | Admitting: Hematology and Oncology

## 2022-01-13 ENCOUNTER — Inpatient Hospital Stay: Payer: Medicare HMO

## 2022-01-13 ENCOUNTER — Encounter: Payer: Self-pay | Admitting: Hematology and Oncology

## 2022-01-13 ENCOUNTER — Other Ambulatory Visit: Payer: Self-pay

## 2022-01-13 VITALS — BP 138/82 | HR 75 | Temp 97.5°F | Resp 16 | Ht 64.0 in | Wt 187.6 lb

## 2022-01-13 DIAGNOSIS — R3911 Hesitancy of micturition: Secondary | ICD-10-CM | POA: Diagnosis not present

## 2022-01-13 DIAGNOSIS — C3432 Malignant neoplasm of lower lobe, left bronchus or lung: Secondary | ICD-10-CM | POA: Diagnosis not present

## 2022-01-13 DIAGNOSIS — Z923 Personal history of irradiation: Secondary | ICD-10-CM | POA: Diagnosis not present

## 2022-01-13 DIAGNOSIS — Z833 Family history of diabetes mellitus: Secondary | ICD-10-CM | POA: Diagnosis not present

## 2022-01-13 DIAGNOSIS — E876 Hypokalemia: Secondary | ICD-10-CM

## 2022-01-13 DIAGNOSIS — E860 Dehydration: Secondary | ICD-10-CM | POA: Diagnosis not present

## 2022-01-13 DIAGNOSIS — Z9221 Personal history of antineoplastic chemotherapy: Secondary | ICD-10-CM | POA: Diagnosis not present

## 2022-01-13 DIAGNOSIS — C50412 Malignant neoplasm of upper-outer quadrant of left female breast: Secondary | ICD-10-CM | POA: Diagnosis not present

## 2022-01-13 DIAGNOSIS — Z79811 Long term (current) use of aromatase inhibitors: Secondary | ICD-10-CM | POA: Diagnosis not present

## 2022-01-13 DIAGNOSIS — Z9049 Acquired absence of other specified parts of digestive tract: Secondary | ICD-10-CM | POA: Diagnosis not present

## 2022-01-13 DIAGNOSIS — Z8041 Family history of malignant neoplasm of ovary: Secondary | ICD-10-CM | POA: Diagnosis not present

## 2022-01-13 DIAGNOSIS — Z79899 Other long term (current) drug therapy: Secondary | ICD-10-CM | POA: Diagnosis not present

## 2022-01-13 DIAGNOSIS — Z8719 Personal history of other diseases of the digestive system: Secondary | ICD-10-CM | POA: Diagnosis not present

## 2022-01-13 DIAGNOSIS — Z87891 Personal history of nicotine dependence: Secondary | ICD-10-CM | POA: Diagnosis not present

## 2022-01-13 DIAGNOSIS — Z170 Estrogen receptor positive status [ER+]: Secondary | ICD-10-CM | POA: Diagnosis not present

## 2022-01-13 DIAGNOSIS — K5900 Constipation, unspecified: Secondary | ICD-10-CM | POA: Diagnosis not present

## 2022-01-13 DIAGNOSIS — R531 Weakness: Secondary | ICD-10-CM | POA: Diagnosis not present

## 2022-01-13 DIAGNOSIS — Z808 Family history of malignant neoplasm of other organs or systems: Secondary | ICD-10-CM | POA: Diagnosis not present

## 2022-01-13 DIAGNOSIS — R5383 Other fatigue: Secondary | ICD-10-CM

## 2022-01-13 DIAGNOSIS — J449 Chronic obstructive pulmonary disease, unspecified: Secondary | ICD-10-CM | POA: Diagnosis not present

## 2022-01-13 DIAGNOSIS — R109 Unspecified abdominal pain: Secondary | ICD-10-CM | POA: Diagnosis not present

## 2022-01-13 DIAGNOSIS — C321 Malignant neoplasm of supraglottis: Secondary | ICD-10-CM | POA: Diagnosis not present

## 2022-01-13 DIAGNOSIS — Z5111 Encounter for antineoplastic chemotherapy: Secondary | ICD-10-CM | POA: Diagnosis not present

## 2022-01-13 DIAGNOSIS — R070 Pain in throat: Secondary | ICD-10-CM | POA: Diagnosis not present

## 2022-01-13 DIAGNOSIS — D72819 Decreased white blood cell count, unspecified: Secondary | ICD-10-CM | POA: Diagnosis not present

## 2022-01-13 LAB — CBC WITH DIFFERENTIAL/PLATELET
Abs Immature Granulocytes: 0.01 10*3/uL (ref 0.00–0.07)
Basophils Absolute: 0.1 10*3/uL (ref 0.0–0.1)
Basophils Relative: 1 %
Eosinophils Absolute: 0.1 10*3/uL (ref 0.0–0.5)
Eosinophils Relative: 2 %
HCT: 39.4 % (ref 36.0–46.0)
Hemoglobin: 13.7 g/dL (ref 12.0–15.0)
Immature Granulocytes: 0 %
Lymphocytes Relative: 15 %
Lymphs Abs: 0.8 10*3/uL (ref 0.7–4.0)
MCH: 33.3 pg (ref 26.0–34.0)
MCHC: 34.8 g/dL (ref 30.0–36.0)
MCV: 95.9 fL (ref 80.0–100.0)
Monocytes Absolute: 0.4 10*3/uL (ref 0.1–1.0)
Monocytes Relative: 7 %
Neutro Abs: 3.8 10*3/uL (ref 1.7–7.7)
Neutrophils Relative %: 75 %
Platelets: 210 10*3/uL (ref 150–400)
RBC: 4.11 MIL/uL (ref 3.87–5.11)
RDW: 14.7 % (ref 11.5–15.5)
WBC: 5.2 10*3/uL (ref 4.0–10.5)
nRBC: 0 % (ref 0.0–0.2)

## 2022-01-13 LAB — COMPREHENSIVE METABOLIC PANEL
ALT: 6 U/L (ref 0–44)
AST: 11 U/L — ABNORMAL LOW (ref 15–41)
Albumin: 4 g/dL (ref 3.5–5.0)
Alkaline Phosphatase: 65 U/L (ref 38–126)
Anion gap: 4 — ABNORMAL LOW (ref 5–15)
BUN: 12 mg/dL (ref 8–23)
CO2: 33 mmol/L — ABNORMAL HIGH (ref 22–32)
Calcium: 10.3 mg/dL (ref 8.9–10.3)
Chloride: 102 mmol/L (ref 98–111)
Creatinine, Ser: 0.91 mg/dL (ref 0.44–1.00)
GFR, Estimated: >60 mL/min (ref >60)
Glucose, Bld: 107 mg/dL — ABNORMAL HIGH (ref 70–99)
Potassium: 4.4 mmol/L (ref 3.5–5.1)
Sodium: 139 mmol/L (ref 135–145)
Total Bilirubin: 1.9 mg/dL — ABNORMAL HIGH (ref 0.3–1.2)
Total Protein: 7.4 g/dL (ref 6.5–8.1)

## 2022-01-13 LAB — SAMPLE TO BLOOD BANK

## 2022-01-13 NOTE — Progress Notes (Signed)
[facility] Telephone:([phone]) [phone]   [phone]  PROGRESS NOTE  Patient Care Team: Benita Stabile, MD as PCP - General (Internal Medicine) Laqueta Linden, MD (Inactive) as PCP - Cardiology (Cardiology) Galen Manila, Novella Olive, MD (Inactive) as Consulting Physician (Hematology and Oncology) Harriette Bouillon, MD as Consulting Physician (General Surgery) Lurline Hare, MD as Consulting Physician (Radiation Oncology) Salomon Fick, NP as Nurse Practitioner (Hematology and Oncology) West Bali, MD (Inactive) as Consulting Physician (Gastroenterology) Malmfelt, Lise Auer, RN as Oncology Nurse Navigator Christia Reading, MD as Consulting Physician (Otolaryngology) Lonie Peak, MD as Consulting Physician (Radiation Oncology) [name], MD as Consulting Physician (Hematology and Oncology)  CHIEF COMPLAINTS/PURPOSE OF CONSULTATION:  1.Malignant neoplasm of glottis/supraglottis  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 3. 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    06/24/2014 Breast US   Left breast: ill-defined predominantly hypoechoic area with some increased echogenicity in the 2 o'clock position of the left breast, 7 cm from the nipple. 2.6 x 2.3 x 1.8 cm in maximum dimensions.   07/01/2014 Initial Biopsy   Left breast core needle bx: Invasive mammary carcinoma with lobular features, ER+ (100%), PR+ (91%), HER2/neu negative (ratio 1.09), Ki-67 32%. E-cadherin strongly positive, diagnostic for IDC    07/01/2014 Clinical Stage   Stage IIA: T2 N0   08/20/2014 Definitive Surgery   Left breast seed localized lumpectomy/SLNB: IDC, +LVI, DCIS, 1 LN removed and  positive for metastatic carcinoma. Grade 2. HER2/neu repeated and negative (ratio 0.69-1.9).    08/20/2014 Pathologic Stage   Stage IIB: pT2 pN1a pMx   09/17/2014 Surgery   Port-a cath placement and re-excision   10/02/2014 - 01/15/2015 Chemotherapy   CMF x 6 cycles.  patient refused Taxane-containing chemotherapy.   03/07/2015 Procedure   Comp Cancer Panel reveals no variant at APC, ATM, AXIN2, BARD1, BMPR1A, BRCA1, BRCA2, BRIP1, CDH1, CDK4, CDKN2A, CHEK2, FANCC, MLH1, MSH2, MSH6, MUTYH, NBN, PALB2, PMS2, POLD1, POLE, PTEN, RAD51C, RAD51D, SMAD4, STK11, TP53, VHL, and XRCC2.    04/06/2015 - 05/21/2015 Radiation Therapy   Adjuvant RT Michell Heinrich): Left breast/ 45 Gy over 25 fractions.   Left supraclavicular fossa and axilla/ 45 Gy over 25 fractions. Left breast boost/ 16 Gy over 8 fractions. Total dose: 61 Gy   06/04/2015 - 08/07/2015 Anti-estrogen oral therapy   Exemestane 25 mg daily.  Planned duration of therapy 5 years.    07/23/2015 Survivorship   Survivorship care plan completed and mailed to patient in lieu of in person visit   08/06/2015 Adverse Reaction   Severe hot flashes and mood swings   08/08/2015 - 12/07/2015 Anti-estrogen oral therapy   Arimidex   12/04/2015 Adverse Reaction   Worsening depression, patient stopped Arimidex   12/07/2015 -  Anti-estrogen oral therapy   Tamoxifen   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HCC)  07/30/2021 Initial Diagnosis   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HCC) P 16 positive cT1N1   08/16/2021 - 09/13/2021 Radiation Therapy   Completed definitive radiation   09/30/2021 Pathology Results   [facility] Pathology PDL 1 29%   Squamous cell lung cancer (HCC)  07/23/2021 Imaging    PET/CT  demonstrated small right glottic lesion is mildly  hypermetabolic and consistent with no neoplasm.  PET also demonstrated borderline right level 2 lymph node left supraclavicular, mediastinal and hilar lymphadenopathy and hypermetabolic left lower lobe pulmonary  nodule all suspicious of metastatic focus.  No abdominal pelvic metastatic disease or osseous metastatic disease was appreciated.   08/31/2021 Initial Diagnosis   Squamous cell carcinoma of lung (HCC)   09/08/2021 Cancer Staging   Staging form: Lung, AJCC 8th Edition - Clinical stage from 09/08/2021: Stage IIIB (cT1b, cN3, cM0) - Signed by Lonie Peak, MD on 09/08/2021 Stage prefix: Initial diagnosis   09/30/2021 Pathology Results   [facility] 360 Pathology PDL 1 29%   11/01/2021 -  Chemotherapy   Patient is on Treatment Plan : LUNG Carboplatin / Paclitaxel + XRT q7d      INTERIM HISTORY:  Regina Mcmahon 71 y.o. female returns for a follow up prior to C5D1 of weekly carbo/taxol given concurrent with radiation for squamous  cell carcinoma of the lung.  She is here for follow by herself, sister on phone. She received 4 weekly cycles of carboplatin on 5/2 Completed radiation about 3/4 weeks ago. 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 No chest pain, cough, SOB. NO tingling or numbness No change in bowel habits or urinary habits  MEDICAL HISTORY:  Past Medical History:  Diagnosis Date   Arthritis    Asthma    Asymptomatic varicose veins    Breast cancer (HCC) 06/2014   left   Cancer (HCC)    Supra Glottis   Chronic airway obstruction (HCC)    Complication of anesthesia 2016   difficulty waking up, Oxygen dropped low.   COPD (chronic obstructive pulmonary disease) (HCC)    Depression    Dyspnea    GERD (gastroesophageal reflux disease)    Hemorrhage rectum    and anus.   Hypertension    Insomnia    Other symptoms involving digestive system(787.99)    Pain    Hx.pain in joint involving pelvic region and thigh; pain in limb   Palpitations    Panic attacks    Personal history of chemotherapy 2016   Personal history of radiation therapy 2016   Pneumonia    Pre-diabetes    Prediabetes    Sinusitis    Symptomatic menopausal or female climacteric  states    Varicose veins of both lower extremities with pain     SURGICAL HISTORY: Past Surgical History:  Procedure Laterality Date   BREAST BIOPSY Left 10/2012   BREAST BIOPSY Left 07/01/2014   malignant   BREAST LUMPECTOMY Left 08/20/2014   BRONCHIAL BIOPSY  08/23/2021   Procedure: BRONCHIAL BIOPSIES;  Surgeon: Leslye Peer, MD;  Location: [facility];  Service: Pulmonary;;   BRONCHIAL BRUSHINGS  08/23/2021   Procedure: BRONCHIAL BRUSHINGS;  Surgeon: Leslye Peer, MD;  Location: [facility];  Service: Pulmonary;;   BRONCHIAL NEEDLE ASPIRATION BIOPSY  08/23/2021   Procedure: BRONCHIAL NEEDLE ASPIRATION BIOPSIES;  Surgeon: Leslye Peer, MD;  Location: [facility];  Service: Pulmonary;;   BRONCHIAL WASHINGS  08/23/2021   Procedure: BRONCHIAL WASHINGS;  Surgeon: Leslye Peer, MD;  Location: [facility];  Service: Pulmonary;;   CESAREAN SECTION     CHOLECYSTECTOMY  1985   COLONOSCOPY  2002   Dr. Cleotis Nipper: internal hemorrhoids, one small rectal polyp, adenomatous path    COLONOSCOPY N/A 11/23/2015   Procedure: COLONOSCOPY;  Surgeon: West Bali, MD;  Location: [facility];  Service: Endoscopy;  Laterality:  N/A;  1100 - moved to 10:45 - office to notify   ESOPHAGOGASTRODUODENOSCOPY N/A 11/23/2015   Procedure: ESOPHAGOGASTRODUODENOSCOPY (EGD);  Surgeon: West Bali, MD;  Location: [facility];  Service: Endoscopy;  Laterality: N/A;   IR IMAGING GUIDED PORT INSERTION  11/08/2021   PORT-A-CATH REMOVAL  02/2015   PORTACATH PLACEMENT Right 09/17/2014   Procedure: INSERTION PORT-A-CATH WITH ULTRASOUND;  Surgeon: Harriette Bouillon, MD;  Location: [facility];  Service: General;  Laterality: Right;  IJ   RADIOACTIVE SEED GUIDED PARTIAL MASTECTOMY WITH AXILLARY SENTINEL LYMPH NODE BIOPSY Left 08/20/2014   Procedure: LEFT BREAST LUMPECTOMY WITH RADIOACTIVE SEED LOCALIZATION AND SENTINEL LYMPH NODE MAPPING;  Surgeon: Harriette Bouillon, MD;  Location: [facility];   Service: General;  Laterality: Left;   RE-EXCISION OF BREAST LUMPECTOMY Left 09/17/2014   Procedure: RE-EXCISION OF BREAST LUMPECTOMY;  Surgeon: Harriette Bouillon, MD;  Location: [facility];  Service: General;  Laterality: Left;   TOTAL HIP ARTHROPLASTY Right 02/22/2021   Procedure: RIGHT TOTAL HIP ARTHROPLASTY ANTERIOR APPROACH;  Surgeon: Eldred Manges, MD;  Location: [facility];  Service: Orthopedics;  Laterality: Right;   TUBAL LIGATION     VIDEO BRONCHOSCOPY WITH ENDOBRONCHIAL ULTRASOUND N/A 08/23/2021   Procedure: VIDEO BRONCHOSCOPY WITH ENDOBRONCHIAL ULTRASOUND;  Surgeon: Leslye Peer, MD;  Location: [facility];  Service: Pulmonary;  Laterality: N/A;   VIDEO BRONCHOSCOPY WITH RADIAL ENDOBRONCHIAL ULTRASOUND  08/23/2021   Procedure: VIDEO BRONCHOSCOPY WITH RADIAL ENDOBRONCHIAL ULTRASOUND;  Surgeon: Leslye Peer, MD;  Location: [facility];  Service: Pulmonary;;    SOCIAL HISTORY: Social History   Socioeconomic History   Marital status: Divorced    Spouse name: Not on file   Number of children: 2   Years of education: Not on file   Highest education level: Not on file  Occupational History   Not on file  Tobacco Use   Smoking status: Former    Packs/day: 1.00    Years: 42.00    Total pack years: 42.00    Types: Cigarettes    Quit date: 02/22/2021    Years since quitting: 0.8   Smokeless tobacco: Never  Vaping Use   Vaping Use: Never used  Substance and Sexual Activity   Alcohol use: No    Alcohol/week: 0.0 standard drinks of alcohol   Drug use: No   Sexual activity: Never    Birth control/protection: None  Other Topics Concern   Not on file  Social History Narrative   Not on file   Social Determinants of Health   Financial Resource Strain: Not on file  Food Insecurity: Not on file  Transportation Needs: Not on file  Physical Activity: Not on file  Stress: Not on file  Social Connections: Unknown (08/05/2021)   Social Connection and Isolation Panel  [NHANES]    Frequency of Communication with Friends and Family: More than three times a week    Frequency of Social Gatherings with Friends and Family: More than three times a week    Attends Religious Services: Not on Marketing executive or Organizations: Not on file    Attends Banker Meetings: Not on file    Marital Status: Not on file  Intimate Partner Violence: Not on file    FAMILY HISTORY: Family History  Problem Relation Age of Onset   Diabetes Mother    Ovarian cancer Mother 20   Other Father        died in  MVA at age 77   Other Sister        mitral valve disorder   Melanoma Sister 49       removed from back of leg   Colon cancer Brother        early 11s, succumbed to disease   Cancer Paternal Aunt        leukemia, bone, or lung cancer   Alzheimer's disease Maternal Grandmother    Heart attack Maternal Grandfather    Heart attack Paternal Grandmother    COPD Paternal Grandfather    Emphysema Paternal Grandfather    Congestive Heart Failure Paternal Aunt    Stomach cancer Paternal Uncle    Kidney cancer Maternal Uncle    Cancer Cousin        dx. teens/dx. 40s   Cancer Cousin    Colon cancer Cousin     ALLERGIES:  has No Known Allergies.  MEDICATIONS:  Current Outpatient Medications  Medication Sig Dispense Refill   albuterol (PROVENTIL HFA;VENTOLIN HFA) 108 (90 BASE) MCG/ACT inhaler Inhale 2 puffs into the lungs every 6 (six) hours as needed for wheezing or shortness of breath.      ALPRAZolam (XANAX) 0.5 MG tablet Take 0.5 mg by mouth 3 (three) times daily.     ANORO ELLIPTA 62.5-25 MCG/INH AEPB Inhale 1 puff into the lungs daily.     aspirin EC 325 MG EC tablet Take 1 tablet (325 mg total) by mouth daily with breakfast. Must take at least 4 weeks postop for DVT prophylaxis 30 tablet 0   calcium carbonate (TUMS - DOSED IN MG ELEMENTAL CALCIUM) 500 MG chewable tablet Chew 2 tablets by mouth 3 (three) times daily as needed for indigestion  or heartburn.     fluconazole (DIFLUCAN) 100 MG tablet Take 2 tablets today, then 1 tablet daily x 20 more days. 22 tablet 0   furosemide (LASIX) 20 MG tablet Take 20 mg by mouth daily.     hydrochlorothiazide (HYDRODIURIL) 12.5 MG tablet Take 12.5 mg by mouth daily.     HYDROcodone-acetaminophen (HYCET) 7.5-325 mg/15 ml solution Take 10 mLs by mouth every 12 (twelve) hours as needed for moderate pain. 473 mL 0   ibuprofen (ADVIL) 800 MG tablet Take 800 mg by mouth every 8 (eight) hours as needed for moderate pain.     lidocaine (XYLOCAINE) 2 % solution Patient: Mix 1part 2% viscous lidocaine, 1part H20. Swallow 10mL of diluted mixture, before meals and at bedtime, up to QID for sore throat. 200 mL 3   lidocaine-prilocaine (EMLA) cream Apply 1 application. topically as needed. 30 g 0   Menthol, Topical Analgesic, (BIOFREEZE EX) Apply 1 application topically daily as needed (pain).     nystatin (MYCOSTATIN) 100000 UNIT/ML suspension Take 5 mLs (500,000 Units total) by mouth 4 (four) times daily. 60 mL 0   ondansetron (ZOFRAN) 8 MG tablet Take 1 tablet (8 mg total) by mouth every 8 (eight) hours as needed for nausea or vomiting. 60 tablet 0   pantoprazole (PROTONIX) 40 MG tablet Take 1 tablet by mouth once daily 90 tablet 0   potassium chloride (KLOR-CON) 20 MEQ packet Take 20 mEq by mouth 2 (two) times daily for 15 days. Dissolve in at least 5 ounces cold water or other beverage and take after meals 30 packet 0   prochlorperazine (COMPAZINE) 10 MG tablet Take 1 tablet (10 mg total) by mouth every 6 (six) hours as needed for nausea or vomiting. 60 tablet 0  sertraline (ZOLOFT) 100 MG tablet Take 100 mg by mouth daily.     sucralfate (CARAFATE) 1 g tablet Dissolve 1 tablet in 10 mL H20 and swallow 30 min prior to meals and bedtime. 40 tablet 3   No current facility-administered medications for this visit.      PHYSICAL EXAMINATION: ECOG PERFORMANCE STATUS: 1 - Symptomatic but completely  ambulatory  Vitals:   01/13/22 1323  BP: 138/82  Pulse: 75  Resp: 16  Temp: (!) 97.5 F (36.4 C)  SpO2: 97%   Filed Weights   01/13/22 1323  Weight: 187 lb 9.6 oz (85.1 kg)   Physical Exam Constitutional:      Appearance: Normal appearance.  HENT:     Mouth/Throat:     Comments: Hoarseness Neck:     Comments: Lymphedema  Cardiovascular:     Rate and Rhythm: Normal rate and regular rhythm.     Pulses: Normal pulses.     Heart sounds: Normal heart sounds.  Pulmonary:     Effort: Pulmonary effort is normal.     Breath sounds: Normal breath sounds.  Abdominal:     General: Abdomen is flat.     Palpations: Abdomen is soft.  Musculoskeletal:     Cervical back: Normal range of motion and neck supple. No rigidity.  Lymphadenopathy:     Cervical: No cervical adenopathy.  Skin:    General: Skin is warm and dry.     Comments: Right shin wound with some eschar   Neurological:     General: No focal deficit present.     Mental Status: She is alert.      LABORATORY DATA:  I have reviewed the data as listed    Latest Ref Rng & Units 12/29/2021    1:25 PM 12/13/2021   11:17 AM 12/06/2021   11:46 AM  CBC  WBC 4.0 - 10.5 K/uL 3.5  2.7  2.4   Hemoglobin 12.0 - 15.0 g/dL 27.5  9.7  17.0   Hematocrit 36.0 - 46.0 % 30.8  27.5  29.2   Platelets 150 - 400 K/uL 193  96  54        Latest Ref Rng & Units 12/29/2021    1:25 PM 12/13/2021   11:17 AM 12/06/2021   11:46 AM  CMP  Glucose 70 - 99 mg/dL 017  494  496   BUN 8 - 23 mg/dL 19  16  16    Creatinine 0.44 - 1.00 mg/dL 7.59  1.63  8.46   Sodium 135 - 145 mmol/L 140  140  139   Potassium 3.5 - 5.1 mmol/L 2.6  3.3  3.4   Chloride 98 - 111 mmol/L 97  100  100   CO2 22 - 32 mmol/L 34  35  34   Calcium 8.9 - 10.3 mg/dL 9.9  9.6  9.6   Total Protein 6.5 - 8.1 g/dL 7.1  6.9  6.6   Total Bilirubin 0.3 - 1.2 mg/dL 1.8  2.8  3.0   Alkaline Phos 38 - 126 U/L 67  72  73   AST 15 - 41 U/L 14  10  9    ALT 0 - 44 U/L 11  8  8      RADIOGRAPHIC STUDIES: I have personally reviewed the radiological images as listed and agreed with the findings in the report. No results found.  ASSESSMENT & PLAN Regina Mcmahon is a 71 y.o. female who returns for a follow up.  Malignant neoplasm  of supraglottis --Thyroid US on 06/03/21 revealed a suspicious nodule located in the mid right thyroid lobe, measuring 5.0 cm in the greatest extent --Underwent laryngoscopy on 06/07/2021 which revealed a granular mass of the right glottis suggestive of glottic cancer. CT neck from 06/23/2021 showed 9 mm nodule right vocal cord. No enlarged lymph nodes were appreciated in the neck. An enlarged right paratracheal lymph node was appreciated as well, noted as suspicious for metastatic disease, as well as the right thyroid nodule measuring 39 x 34 mm.  --Proceeded with biopsy of the right vocal cord mass on 06/25/21 which revealed squamous cell carcinoma; p16 positive. --PET on 07/23/21 demonstrated the small right glottic lesion as mildly hypermetabolic and consistent with known neoplasm. PET also demonstrated: a borderline right level 2 lymph node; left supraclavicular, mediastinal and hilar lymphadenopathy; and a hypermetabolic left lower lobe pulmonary nodule, noted as likely a metastatic focus. No other findings for abdominal/pelvic metastatic disease or osseous metastatic disease were appreciated.  --Received definitive radiation from 08/16/2021 through 09/13/2021. 54 Gy in 20 Fx.  --Post treatment CT neck from 10/18/2021 showed resolved polypoid lesion of the larynx. No worrisome nodes in the neck. --She has a follow up with Dr Jenne Pane on 02/28/2022 No concern for recurrence on exam today.  She should consider imaging at 1 year and follow-up with ENT as well as radiation oncology team for surveillance recommendations  2. 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 --Underwent EBUS with biopsy of left lower lobe pulmonary nodule, Level 4R and 7 lymph nodes. Pathology confirmed squamous cell carcinoma of left lower lobe nodule along with level 4R and 7 level nodules --Started concurrent chemoradiation with weekly carbo/taxol on 11/01/2021. -- PD-L1 29% hence she is not a candidate for single agent immunotherapy.  We will consider adjuvant immunotherapy after completion of concurrent chemoradiation for 1 year. --She is on concurrent chemotherapy radiation with CarboTaxol s/p 4 cycles.  --Chemotherapy discontinued for the last 2 weeks because of thrombocytopenia. -- completed CRT, will discuss with Dr Ellin Saba, she wants to do adjuvant durvalumab at [facility] if possible. Sent an inbasket to Dr Ellin Saba and Kennith Gain, NN.  3. 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 --She is due for mammogram, will plan to schedule once lung cancer therapy is complete --She does have a standing order for mammogram, yet to schedule  4. Leukopenia/Anemia/Thrombocytopenia: --Secondary to chemotherapy --CBC and CMP pending  Follow up: Return to clinic in 4 weeks  No orders of the defined types were placed in this encounter.   All questions were answered. The patient knows to call the clinic with any problems, questions or concerns.  I have spent a total of 30 minutes minutes of face-to-face and non-face-to-face time, preparing to see the patient,   performing a medically appropriate examination, counseling and educating the patient, ordering medications/tests, documenting clinical information in the electronic health record,  and care coordination.   [name] MD

## 2022-01-18 ENCOUNTER — Other Ambulatory Visit: Payer: Self-pay | Admitting: Hematology and Oncology

## 2022-01-18 ENCOUNTER — Encounter: Payer: Self-pay | Admitting: Hematology and Oncology

## 2022-01-18 MED ORDER — HYDROCODONE-ACETAMINOPHEN 7.5-325 MG/15ML PO SOLN: 10.0000 mL | Freq: Two times a day (BID) | ORAL | 0 refills | Status: DC | PRN

## 2022-01-18 NOTE — Progress Notes (Signed)
Pain medication refilled per patient's request  Regina Mcmahon

## 2022-01-18 NOTE — Progress Notes (Signed)
                                                                                                                                                            Patient Name: Regina Mcmahon MRN: [id number] DOB: 1951-07-06 Referring Physician: Jenne Pane DWIGHT (Profile Not Attached) Date of Service: 12/16/2021 [facility], [place]                                                        End Of Treatment Note  Diagnoses: C34.32-Malignant neoplasm of lower lobe, left bronchus or lung  Cancer Staging:  Cancer Staging  Carcinoma of upper-outer quadrant of left female breast [facility]) Staging form: Breast, AJCC 7th Edition - Clinical stage from 07/01/2014: Stage IIA (T2, N0, M0) - Unsigned Staged by: Managing physician Diagnostic confirmation: Positive histology Laterality: Left Tumor size (mm): 2.6 Method of detection of distant metastases: Clinical Histologic grade (G): G2 Histologic grading system: 3 grade system Lymph-vascular invasion (LVI): LVI present/identified, NOS Residual tumor (R): R0 - None Paget's disease: Negative Estrogen receptor status: Positive Estrogen receptor test method: IHC Intensity of estrogen receptor staining: Strong Percentage of positive estrogen receptors (%): 100 Progesterone receptor status: Positive Progesterone receptor test method: IHC Intensity of progesterone receptor staining: Strong Percentage of positive progesterone receptors (%): 91 HER2 status: Negative 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 IHC of regional lymph nodes: Positive Results for molecular studies of regional lymph nodes: Not assessed Circulating tumor cells (CTC): Not assessed Disseminated tumor cells: Not assessed Stage used in treatment planning: Yes National guidelines used in treatment planning: Yes Type of national guideline used in treatment planning: NCCN - Pathologic stage from 08/20/2014: Stage IIB (T2, N1a, cM0) - Unsigned  Malignant neoplasm of supraglottis  (HCC) Staging form: Larynx - Supraglottis, AJCC 8th Edition - Clinical stage from 07/30/2021: cT1, cN1 - Unsigned  Squamous cell lung cancer (HCC) Staging form: Lung, AJCC 8th Edition - Clinical stage from 09/08/2021: Stage IIIB (cT1b, cN3, cM0) - Signed by Lonie Peak, MD on 09/08/2021 Stage prefix: Initial diagnosis   Intent: Curative  Radiation Treatment Dates: 11/03/2021 through 12/16/2021   Site/ Technique: Thorax Chest_LLL/ IMRT  (lung mass and all regional adenopathy, including L SCV, included)  Dose 60Gy in  30 fractions at 2 Gy/ fx  Beam energy: 6X   Narrative: The patient tolerated radiation therapy relatively well.   Plan: The patient will follow-up with radiation oncology in 1 mo. -----------------------------------  Lonie Peak, MD

## 2022-01-20 ENCOUNTER — Inpatient Hospital Stay (HOSPITAL_COMMUNITY): Payer: Medicare HMO | Attending: Hematology | Admitting: Hematology

## 2022-01-20 ENCOUNTER — Encounter: Payer: Self-pay | Admitting: Hematology and Oncology

## 2022-01-20 VITALS — BP 123/86 | HR 84 | Temp 98.6°F | Resp 20 | Ht 61.42 in | Wt 184.3 lb

## 2022-01-20 DIAGNOSIS — G479 Sleep disorder, unspecified: Secondary | ICD-10-CM | POA: Diagnosis not present

## 2022-01-20 DIAGNOSIS — Z9221 Personal history of antineoplastic chemotherapy: Secondary | ICD-10-CM | POA: Diagnosis not present

## 2022-01-20 DIAGNOSIS — R072 Precordial pain: Secondary | ICD-10-CM | POA: Insufficient documentation

## 2022-01-20 DIAGNOSIS — C3432 Malignant neoplasm of lower lobe, left bronchus or lung: Secondary | ICD-10-CM | POA: Insufficient documentation

## 2022-01-20 DIAGNOSIS — M7989 Other specified soft tissue disorders: Secondary | ICD-10-CM | POA: Insufficient documentation

## 2022-01-20 DIAGNOSIS — J449 Chronic obstructive pulmonary disease, unspecified: Secondary | ICD-10-CM | POA: Diagnosis not present

## 2022-01-20 DIAGNOSIS — Z923 Personal history of irradiation: Secondary | ICD-10-CM | POA: Insufficient documentation

## 2022-01-20 DIAGNOSIS — Z9049 Acquired absence of other specified parts of digestive tract: Secondary | ICD-10-CM | POA: Insufficient documentation

## 2022-01-20 DIAGNOSIS — R079 Chest pain, unspecified: Secondary | ICD-10-CM | POA: Insufficient documentation

## 2022-01-20 DIAGNOSIS — R5383 Other fatigue: Secondary | ICD-10-CM | POA: Diagnosis not present

## 2022-01-20 DIAGNOSIS — Z87891 Personal history of nicotine dependence: Secondary | ICD-10-CM | POA: Diagnosis not present

## 2022-01-20 DIAGNOSIS — Z800 Family history of malignant neoplasm of digestive organs: Secondary | ICD-10-CM

## 2022-01-20 DIAGNOSIS — Z8041 Family history of malignant neoplasm of ovary: Secondary | ICD-10-CM

## 2022-01-20 DIAGNOSIS — C50412 Malignant neoplasm of upper-outer quadrant of left female breast: Secondary | ICD-10-CM | POA: Diagnosis not present

## 2022-01-20 DIAGNOSIS — C321 Malignant neoplasm of supraglottis: Secondary | ICD-10-CM | POA: Insufficient documentation

## 2022-01-20 DIAGNOSIS — Z833 Family history of diabetes mellitus: Secondary | ICD-10-CM

## 2022-01-20 DIAGNOSIS — Z818 Family history of other mental and behavioral disorders: Secondary | ICD-10-CM

## 2022-01-20 DIAGNOSIS — C3490 Malignant neoplasm of unspecified part of unspecified bronchus or lung: Secondary | ICD-10-CM

## 2022-01-20 DIAGNOSIS — Z79899 Other long term (current) drug therapy: Secondary | ICD-10-CM

## 2022-01-20 DIAGNOSIS — Z808 Family history of malignant neoplasm of other organs or systems: Secondary | ICD-10-CM | POA: Diagnosis not present

## 2022-01-20 DIAGNOSIS — Z170 Estrogen receptor positive status [ER+]: Secondary | ICD-10-CM | POA: Insufficient documentation

## 2022-01-20 DIAGNOSIS — Z8719 Personal history of other diseases of the digestive system: Secondary | ICD-10-CM | POA: Diagnosis not present

## 2022-01-20 DIAGNOSIS — E669 Obesity, unspecified: Secondary | ICD-10-CM

## 2022-01-20 DIAGNOSIS — Z8249 Family history of ischemic heart disease and other diseases of the circulatory system: Secondary | ICD-10-CM | POA: Insufficient documentation

## 2022-01-20 DIAGNOSIS — C779 Secondary and unspecified malignant neoplasm of lymph node, unspecified: Secondary | ICD-10-CM

## 2022-01-20 DIAGNOSIS — Z8051 Family history of malignant neoplasm of kidney: Secondary | ICD-10-CM | POA: Insufficient documentation

## 2022-01-20 DIAGNOSIS — K5900 Constipation, unspecified: Secondary | ICD-10-CM

## 2022-01-20 DIAGNOSIS — Z836 Family history of other diseases of the respiratory system: Secondary | ICD-10-CM

## 2022-01-20 DIAGNOSIS — Z853 Personal history of malignant neoplasm of breast: Secondary | ICD-10-CM | POA: Insufficient documentation

## 2022-01-20 DIAGNOSIS — Z801 Family history of malignant neoplasm of trachea, bronchus and lung: Secondary | ICD-10-CM

## 2022-01-20 DIAGNOSIS — R070 Pain in throat: Secondary | ICD-10-CM

## 2022-01-20 NOTE — Patient Instructions (Addendum)
[place] at [facility] Discharge Instructions  You were seen and examined today by Dr. Delton Coombes. Dr. Delton Coombes is a medical oncologist, meaning that he specializes in the treatment of cancer diagnoses. Dr. Delton Coombes discussed your past medical history, family history of cancers, and the events that led to you being here today.  You were referred to Dr. Delton Coombes due to a new diagnosis of lung cancer.   Dr. Delton Coombes has recommended a PET scan and a brain MRI. This is done for all patients with lung cancer.   Due to your history of breast cancer, you should also have a mammogram soon.  Follow-up with Dr. Delton Coombes following your scans. At that time, you will start immunotherapy.   Thank you for choosing [place] at [facility] to provide your oncology and hematology care.  To afford each patient quality time with our provider, please arrive at least 15 minutes before your scheduled appointment time.   If you have a lab appointment with the [facility] please come in thru the Main Entrance and check in at the main information desk.  You need to re-schedule your appointment should you arrive 10 or more minutes late.  We strive to give you quality time with our providers, and arriving late affects you and other patients whose appointments are after yours.  Also, if you no show three or more times for appointments you may be dismissed from the clinic at the providers discretion.     Again, thank you for choosing [facility].  Our hope is that these requests will decrease the amount of time that you wait before being seen by our physicians.       _____________________________________________________________  Should you have questions after your visit to [facility], please contact our office at [phone] and follow the prompts.  Our office hours are 8:00 a.m. and 4:30 p.m. Monday - Friday.  Please note that  voicemails left after 4:00 p.m. may not be returned until the following business day.  We are closed weekends and major holidays.  You do have access to a nurse 24-7, just call the main number to the clinic [phone] and do not press any options, hold on the line and a nurse will answer the phone.    For prescription refill requests, have your pharmacy contact our office and allow 72 hours.

## 2022-01-20 NOTE — Progress Notes (Signed)
DISCONTINUE ON PATHWAY REGIMEN - Non-Small Cell Lung     Administer weekly:     Paclitaxel      Carboplatin   **Always confirm dose/schedule in your pharmacy ordering system**  REASON: Continuation Of Treatment PRIOR TREATMENT: [id number]: Carboplatin AUC=2 + Paclitaxel 45 mg/m2 Weekly During Radiation TREATMENT RESPONSE: Partial Response (PR)  START ON PATHWAY REGIMEN - Non-Small Cell Lung     A cycle is every 14 days:     Durvalumab   **Always confirm dose/schedule in your pharmacy ordering system**  Patient Characteristics: Preoperative or Nonsurgical Candidate (Clinical Staging), Stage III - Nonsurgical Candidate (Nonsquamous and Squamous), PS = 0, 1 Therapeutic Status: Preoperative or Nonsurgical Candidate (Clinical Staging) AJCC T Category: cT1b AJCC N Category: cN3 AJCC M Category: cM0 AJCC 8 Stage Grouping: IIIB ECOG Performance Status: 1 Intent of Therapy: Curative Intent, Discussed with Patient

## 2022-01-20 NOTE — Progress Notes (Signed)
Regina Mcmahon presents today for follow after completing radiation to her left lung/subclavian (also completed radiation to her larynx on 09/13/2021)  Pain issues, if any: Continues to deal with throat pain Respiratory: Denies any wheezing or shortness of breath. Reports the hazy weather is bothering her more than usual Using a feeding tube?: N/A Weight changes, if any:  Wt Readings from Last 3 Encounters:  01/21/22 187 lb (84.8 kg)  01/20/22 184 lb 4.8 oz (83.6 kg)  01/13/22 187 lb 9.6 oz (85.1 kg)   Swallowing issues, if any: Continues to struggle but is managing better, and diligent about pushing liquids. Smoking or chewing tobacco? None Last ENT visit was on: Scheduled to see Dr. Melida Quitter on 02/27/2022 Other notable issues, if any: Transferred care to medical oncologist Regina Mcmahon  01/20/2022 PLAN:  Stage III squamous cell lung cancer: - She is 5 weeks status post completion of radiation therapy.  Unfortunately could not receive last 2 cycles of chemotherapy. - Recommend PET CT scan to evaluate response. - If the scan shows at least partial response, we will proceed with consolidation with durvalumab for 1 year. - We discussed side effects of immunotherapy in detail. - RTC after scan to initiate therapy. - Due to personal history of 3 different type of malignancies, recommend genetic testing. - Also recommend repeating MRI which showed questionable bone lesion in the past. 2.  Supraglottic squamous cell carcinoma (T1N1): - Definitive radiation from 08/16/2021 through 09/13/2021 - We will follow-up on the PET scan. 3.  Throat pain and sternal chest pain: - Continue hydrocodone liquid 10 mg every 12 hours as needed.  Scheduled for a PET on 01/27/22 and MRI of brain w/ & w/o contrast on 02/02/2022

## 2022-01-20 NOTE — Progress Notes (Signed)
ON PATHWAY REGIMEN - Non-Small Cell Lung  No Change  Continue With Treatment as Ordered.  Original Decision Date/Time: 10/20/2021 15:38     Administer weekly:     Paclitaxel      Carboplatin   **Always confirm dose/schedule in your pharmacy ordering system**  Patient Characteristics: Preoperative or Nonsurgical Candidate (Clinical Staging), Stage III - Nonsurgical Candidate (Nonsquamous and Squamous), PS = 0, 1 Therapeutic Status: Preoperative or Nonsurgical Candidate (Clinical Staging) AJCC T Category: cT1b AJCC N Category: cN3 AJCC M Category: cM0 AJCC 8 Stage Grouping: IIIB ECOG Performance Status: 1 Intent of Therapy: Curative Intent, Discussed with Patient

## 2022-01-20 NOTE — Progress Notes (Signed)
[facility] [address] [place] [address]   CLINIC:  Medical Oncology/Hematology  CONSULT NOTE  Patient Care Team: [name], MD as PCP - General (Internal Medicine) Herminio Commons, MD (Inactive) as PCP - Cardiology (Cardiology) Whitney Muse, Kelby Fam, MD (Inactive) as Consulting Physician (Hematology and Oncology) Erroll Luna, MD as Consulting Physician (General Surgery) Thea Silversmith, MD as Consulting Physician (Radiation Oncology) Sylvan Cheese, NP as Nurse Practitioner (Hematology and Oncology) Danie Binder, MD (Inactive) as Consulting Physician (Gastroenterology) Malmfelt, Stephani Police, RN as Oncology Nurse Navigator Melida Quitter, MD as Consulting Physician (Otolaryngology) Eppie Gibson, MD as Consulting Physician (Radiation Oncology) Benay Pike, MD as Consulting Physician (Hematology and Oncology)  CHIEF COMPLAINTS/PURPOSE OF CONSULTATION:  Evaluation for stage III squamous cell lung cancer  HISTORY OF PRESENTING ILLNESS:  [name] 71 y.o. female is here because of evaluation for stage III squamous cell lung cancer, at the request of Dr. Chryl Heck.  Today she reports feeling well, and she is accompanied by her sister. She has a history of breast cancer diagnosed in 2016. She reports her baseline weight is 240 lbs, and she has lost about 60 lbs since August 2022 following a right hip replacement surgery on 02/22/21 She reports she has difficulty swallowing large pills, and she is only able to eat about half of her typical meal portions. Her weight has been stable for the past 2-3 months. She denies numbness/tingling and n/v/d. She reports pain in her throat and stabbing pain in her chest while eating for which she is taking 10 MG Hydrocodone once every 12 hours. She reports constipation.  She lives at home on her own, and she is able to do her typical daily activities. She quit smoking 02/22/21 after smoking 2 ppd for 40  years. Her mother had ovarian cancer, her brother had metastatic colon cancer, her sister had melanoma, a paternal uncle had stomach cancer, another paternal uncle had lung cancer, a paternal aunt had thyroid cancer, and another paternal aunt had pancreatic cancer.   MEDICAL HISTORY:  Past Medical History:  Diagnosis Date   Arthritis    Asthma    Asymptomatic varicose veins    Breast cancer ([facility]) 06/2014   left   Cancer ([place])    Supra Glottis   Chronic airway obstruction (HCC)    Complication of anesthesia 2016   difficulty waking up, Oxygen dropped low.   COPD (chronic obstructive pulmonary disease) (HCC)    Depression    Dyspnea    GERD (gastroesophageal reflux disease)    Hemorrhage rectum    and anus.   Hypertension    Insomnia    Other symptoms involving digestive system(787.99)    Pain    Hx.pain in joint involving pelvic region and thigh; pain in limb   Palpitations    Panic attacks    Personal history of chemotherapy 2016   Personal history of radiation therapy 2016   Pneumonia    Pre-diabetes    Prediabetes    Sinusitis    Symptomatic menopausal or female climacteric states    Varicose veins of both lower extremities with pain     SURGICAL HISTORY: Past Surgical History:  Procedure Laterality Date   BREAST BIOPSY Left 10/2012   BREAST BIOPSY Left 07/01/2014   malignant   BREAST LUMPECTOMY Left 08/20/2014   BRONCHIAL BIOPSY  08/23/2021   Procedure: BRONCHIAL BIOPSIES;  Surgeon: Collene Gobble, MD;  Location: [facility] ENDOSCOPY;  Service: Pulmonary;;  BRONCHIAL BRUSHINGS  08/23/2021   Procedure: BRONCHIAL BRUSHINGS;  Surgeon: Collene Gobble, MD;  Location: [facility] ENDOSCOPY;  Service: Pulmonary;;   BRONCHIAL NEEDLE ASPIRATION BIOPSY  08/23/2021   Procedure: BRONCHIAL NEEDLE ASPIRATION BIOPSIES;  Surgeon: Collene Gobble, MD;  Location: [facility] ENDOSCOPY;  Service: Pulmonary;;   BRONCHIAL WASHINGS  08/23/2021   Procedure: BRONCHIAL WASHINGS;  Surgeon: Collene Gobble, MD;   Location: [facility] ENDOSCOPY;  Service: Pulmonary;;   CESAREAN SECTION     CHOLECYSTECTOMY  1985   COLONOSCOPY  2002   Dr. Lindalou Hose: internal hemorrhoids, one small rectal polyp, adenomatous path    COLONOSCOPY N/A 11/23/2015   Procedure: COLONOSCOPY;  Surgeon: Danie Binder, MD;  Location: [facility] ENDO SU[facility];  Service: Endoscopy;  Laterality: N/A;  1100 - moved to 10:45 - office to notify   ESOPHAGOGASTRODUODENOSCOPY N/A 11/23/2015   Procedure: ESOPHAGOGASTRODUODENOSCOPY (EGD);  Surgeon: Danie Binder, MD;  Location: [facility];  Service: Endoscopy;  Laterality: N/A;   IR IMAGING GUIDED PORT INSERTION  11/08/2021   PORT-A-CATH REMOVAL  02/2015   PORTACATH PLACEMENT Right 09/17/2014   Procedure: INSERTION PORT-A-CATH WITH ULTRASOUND;  Surgeon: Erroll Luna, MD;  Location: [facility];  Service: General;  Laterality: Right;  IJ   RADIOACTIVE SEED GUIDED PARTIAL MASTECTOMY WITH AXILLARY SENTINEL LYMPH NODE BIOPSY Left 08/20/2014   Procedure: LEFT BREAST LUMPECTOMY WITH RADIOACTIVE SEED LOCALIZATION AND SENTINEL LYMPH NODE MAPPING;  Surgeon: Erroll Luna, MD;  Location: [facility];  Service: General;  Laterality: Left;   RE-EXCISION OF BREAST LUMPECTOMY Left 09/17/2014   Procedure: RE-EXCISION OF BREAST LUMPECTOMY;  Surgeon: Erroll Luna, MD;  Location: [facility];  Service: General;  Laterality: Left;   TOTAL HIP ARTHROPLASTY Right 02/22/2021   Procedure: RIGHT TOTAL HIP ARTHROPLASTY ANTERIOR APPROACH;  Surgeon: Marybelle Killings, MD;  Location: [facility];  Service: Orthopedics;  Laterality: Right;   TUBAL LIGATION     VIDEO BRONCHOSCOPY WITH ENDOBRONCHIAL ULTRASOUND N/A 08/23/2021   Procedure: VIDEO BRONCHOSCOPY WITH ENDOBRONCHIAL ULTRASOUND;  Surgeon: Collene Gobble, MD;  Location: [place] ENDOSCOPY;  Service: Pulmonary;  Laterality: N/A;   VIDEO BRONCHOSCOPY WITH RADIAL ENDOBRONCHIAL ULTRASOUND  08/23/2021   Procedure: VIDEO BRONCHOSCOPY WITH RADIAL ENDOBRONCHIAL  ULTRASOUND;  Surgeon: Collene Gobble, MD;  Location: [facility] ENDOSCOPY;  Service: Pulmonary;;    SOCIAL HISTORY: Social History   Socioeconomic History   Marital status: Divorced    Spouse name: Not on file   Number of children: 2   Years of education: Not on file   Highest education level: Not on file  Occupational History   Not on file  Tobacco Use   Smoking status: Former    Packs/day: 1.00    Years: 42.00    Total pack years: 42.00    Types: Cigarettes    Quit date: 02/22/2021    Years since quitting: 0.9   Smokeless tobacco: Never  Vaping Use   Vaping Use: Never used  Substance and Sexual Activity   Alcohol use: No    Alcohol/week: 0.0 standard drinks of alcohol   Drug use: No   Sexual activity: Never    Birth control/protection: None  Other Topics Concern   Not on file  Social History Narrative   Not on file   Social Determinants of Health   Financial Resource Strain: Not on file  Food Insecurity: Not on file  Transportation Needs: Not on file  Physical Activity: Not on file  Stress: Not on file  Social  Connections: Unknown (08/05/2021)   Social Connection and Isolation Panel [NHANES]    Frequency of Communication with Friends and Family: More than three times a week    Frequency of Social Gatherings with Friends and Family: More than three times a week    Attends Religious Services: Not on Advertising copywriter or Organizations: Not on file    Attends Archivist Meetings: Not on file    Marital Status: Not on file  Intimate Partner Violence: Not on file    FAMILY HISTORY: Family History  Problem Relation Age of Onset   Diabetes Mother    Ovarian cancer Mother 64   Other Father        died in MVA at age 14   Other Sister        mitral valve disorder   Melanoma Sister 70       removed from back of leg   Colon cancer Brother        early 70s, succumbed to disease   Cancer Paternal Aunt        leukemia, bone, or lung cancer    Alzheimer's disease Maternal Grandmother    Heart attack Maternal Grandfather    Heart attack Paternal Grandmother    COPD Paternal Grandfather    Emphysema Paternal Grandfather    Congestive Heart Failure Paternal Aunt    Stomach cancer Paternal Uncle    Kidney cancer Maternal Uncle    Cancer Cousin        dx. teens/dx. 10s   Cancer Cousin    Colon cancer Cousin     ALLERGIES:  has No Known Allergies.  MEDICATIONS:  Current Outpatient Medications  Medication Sig Dispense Refill   albuterol (PROVENTIL HFA;VENTOLIN HFA) 108 (90 BASE) MCG/ACT inhaler Inhale 2 puffs into the lungs every 6 (six) hours as needed for wheezing or shortness of breath.      ALPRAZolam (XANAX) 0.5 MG tablet Take 0.5 mg by mouth 3 (three) times daily.     ANORO ELLIPTA 62.5-25 MCG/INH AEPB Inhale 1 puff into the lungs daily.     aspirin EC 325 MG EC tablet Take 1 tablet (325 mg total) by mouth daily with breakfast. Must take at least 4 weeks postop for DVT prophylaxis 30 tablet 0   calcium carbonate (TUMS - DOSED IN MG ELEMENTAL CALCIUM) 500 MG chewable tablet Chew 2 tablets by mouth 3 (three) times daily as needed for indigestion or heartburn.     fluconazole (DIFLUCAN) 100 MG tablet Take 2 tablets today, then 1 tablet daily x 20 more days. 22 tablet 0   furosemide (LASIX) 20 MG tablet Take 20 mg by mouth daily.     hydrochlorothiazide (HYDRODIURIL) 12.5 MG tablet Take 12.5 mg by mouth daily.     HYDROcodone-acetaminophen (HYCET) 7.5-325 mg/15 ml solution Take 10 mLs by mouth every 12 (twelve) hours as needed for moderate pain. 473 mL 0   ibuprofen (ADVIL) 800 MG tablet Take 800 mg by mouth every 8 (eight) hours as needed for moderate pain.     lidocaine (XYLOCAINE) 2 % solution Patient: Mix 1part 2% viscous lidocaine, 1part H20. Swallow 63mL of diluted mixture, 99min before meals and at bedtime, up to QID for sore throat. 200 mL 3   lidocaine-prilocaine (EMLA) cream Apply 1 application. topically as needed. 30  g 0   Menthol, Topical Analgesic, (BIOFREEZE EX) Apply 1 application topically daily as needed (pain).     nystatin (MYCOSTATIN) 100000 UNIT/ML  suspension Take 5 mLs (500,000 Units total) by mouth 4 (four) times daily. 60 mL 0   ondansetron (ZOFRAN) 8 MG tablet Take 1 tablet (8 mg total) by mouth every 8 (eight) hours as needed for nausea or vomiting. 60 tablet 0   pantoprazole (PROTONIX) 40 MG tablet Take 1 tablet by mouth once daily 90 tablet 0   prochlorperazine (COMPAZINE) 10 MG tablet Take 1 tablet (10 mg total) by mouth every 6 (six) hours as needed for nausea or vomiting. 60 tablet 0   sertraline (ZOLOFT) 100 MG tablet Take 100 mg by mouth daily.     sucralfate (CARAFATE) 1 g tablet Dissolve 1 tablet in 10 mL H20 and swallow 30 min prior to meals and bedtime. 40 tablet 3   potassium chloride (KLOR-CON) 20 MEQ packet Take 20 mEq by mouth 2 (two) times daily for 15 days. Dissolve in at least 5 ounces cold water or other beverage and take after meals 30 packet 0   No current facility-administered medications for this visit.    REVIEW OF SYSTEMS:   Review of Systems  Constitutional:  Positive for fatigue and unexpected weight change. Negative for appetite change.  HENT:   Positive for sore throat (5/10).   Cardiovascular:  Positive for leg swelling.  Gastrointestinal:  Positive for constipation. Negative for diarrhea, nausea and vomiting.  Genitourinary:  Positive for difficulty urinating and dysuria.   Neurological:  Negative for numbness.  Psychiatric/Behavioral:  Positive for sleep disturbance. The patient is nervous/anxious.   All other systems reviewed and are negative.    PHYSICAL EXAMINATION: ECOG PERFORMANCE STATUS: 1 - Symptomatic but completely ambulatory  Vitals:   01/20/22 0809  BP: 123/86  Pulse: 84  Resp: 20  Temp: 98.6 F (37 C)  SpO2: 94%   Filed Weights   01/20/22 0809  Weight: 184 lb 4.8 oz (83.6 kg)   Physical Exam Vitals reviewed.  Constitutional:       Appearance: Normal appearance. She is obese.  Cardiovascular:     Rate and Rhythm: Normal rate and regular rhythm.     Pulses: Normal pulses.     Heart sounds: Normal heart sounds.  Pulmonary:     Effort: Pulmonary effort is normal.     Breath sounds: Normal breath sounds.  Chest:     Chest wall: No mass.  Abdominal:     Palpations: Abdomen is soft. There is no hepatomegaly, splenomegaly or mass.     Tenderness: There is no abdominal tenderness.  Musculoskeletal:     Right lower leg: No edema.     Left lower leg: No edema.  Lymphadenopathy:     Cervical: No cervical adenopathy.     Right cervical: No superficial, deep or posterior cervical adenopathy.    Left cervical: No superficial, deep or posterior cervical adenopathy.     Upper Body:     Right upper body: No supraclavicular or axillary adenopathy.     Left upper body: No supraclavicular or axillary adenopathy.  Neurological:     General: No focal deficit present.     Mental Status: She is alert and oriented to person, place, and time.  Psychiatric:        Mood and Affect: Mood normal.        Behavior: Behavior normal.      LABORATORY DATA:  I have reviewed the data as listed    Latest Ref Rng & Units 01/13/2022    2:02 PM 12/29/2021    1:25 PM 12/13/2021  11:17 AM  CBC  WBC 4.0 - 10.5 K/uL 5.2  3.5  2.7   Hemoglobin 12.0 - 15.0 g/dL 13.7  10.7  9.7   Hematocrit 36.0 - 46.0 % 39.4  30.8  27.5   Platelets 150 - 400 K/uL 210  193  96       Latest Ref Rng & Units 01/13/2022    2:02 PM 12/29/2021    1:25 PM 12/13/2021   11:17 AM  CMP  Glucose 70 - 99 mg/dL 107  109  126   BUN 8 - 23 mg/dL 12  19  16    Creatinine 0.44 - 1.00 mg/dL 0.91  0.67  0.76   Sodium 135 - 145 mmol/L 139  140  140   Potassium 3.5 - 5.1 mmol/L 4.4  2.6  3.3   Chloride 98 - 111 mmol/L 102  97  100   CO2 22 - 32 mmol/L 33  34  35   Calcium 8.9 - 10.3 mg/dL 10.3  9.9  9.6   Total Protein 6.5 - 8.1 g/dL 7.4  7.1  6.9   Total Bilirubin 0.3 -  1.2 mg/dL 1.9  1.8  2.8   Alkaline Phos 38 - 126 U/L 65  67  72   AST 15 - 41 U/L 11  14  10    ALT 0 - 44 U/L 6  11  8      RADIOGRAPHIC STUDIES: I have personally reviewed the radiological images as listed and agreed with the findings in the report. No results found.  ASSESSMENT:  Stage III squamous cell cancer: - LLL lung mass FNA (08/23/2021): Malignant cells consistent with squamous cell carcinoma. - Lymph node 4R and 7: Malignant cells consistent with NSCLC. - Chemoradiation therapy with weekly carboplatin and paclitaxel from 11/01/2021, week for chemotherapy on 11/23/2021.  XRT completed on 12/16/2021. - She missed further doses of chemotherapy secondary to thrombocytopenia.   Social/family history: - Seen today with her Sister Regina Mcmahon.  Patient lives by herself.  Quit smoking on 02/22/2021.  Smoked 2 packs/day for 40 years. - Mother died of ovarian cancer.  Brother died of colon cancer at age 25.  Sister had melanoma.  Paternal uncles had stomach cancer and lung cancer.  Paternal aunt had thyroid cancer.  Another paternal aunt had pancreatic cancer.   PLAN:  Stage III squamous cell lung cancer: - She is 5 weeks status post completion of radiation therapy.  Unfortunately could not receive last 2 cycles of chemotherapy. - Recommend PET CT scan to evaluate response. - If the scan shows at least partial response, we will proceed with consolidation with durvalumab for 1 year. - We discussed side effects of immunotherapy in detail. - RTC after scan to initiate therapy. - Due to personal history of 3 different type of malignancies, recommend genetic testing. - Also recommend repeating MRI which showed questionable bone lesion in the past.  2.  Supraglottic squamous cell carcinoma (T1N1): - Definitive radiation from 08/16/2021 through 09/13/2021 - We will follow-up on the PET scan.  3.  Throat pain and sternal chest pain: - Continue hydrocodone liquid 10 mg every 12 hours as  needed.   All questions were answered. The patient knows to call the clinic with any problems, questions or concerns.   Derek Jack, MD, 01/20/22 8:29 AM  [place] [phone]   I, Thana Ates, am acting as a scribe for Dr. Derek Jack.  I, Derek Jack MD, have reviewed the above documentation for  accuracy and completeness, and I agree with the above.

## 2022-01-21 ENCOUNTER — Other Ambulatory Visit: Payer: Self-pay

## 2022-01-21 ENCOUNTER — Ambulatory Visit
Admission: RE | Admit: 2022-01-21 | Discharge: 2022-01-21 | Disposition: A | Discharge status: Home / Self Care | Payer: Medicare HMO | Source: Ambulatory Visit | Attending: Radiation Oncology | Admitting: Radiation Oncology

## 2022-01-21 ENCOUNTER — Encounter: Payer: Self-pay | Admitting: Radiation Oncology

## 2022-01-21 VITALS — BP 121/77 | HR 83 | Temp 98.5°F | Resp 18 | Ht 61.5 in | Wt 187.0 lb

## 2022-01-21 DIAGNOSIS — I890 Lymphedema, not elsewhere classified: Secondary | ICD-10-CM | POA: Diagnosis not present

## 2022-01-21 DIAGNOSIS — R072 Precordial pain: Secondary | ICD-10-CM | POA: Diagnosis not present

## 2022-01-21 DIAGNOSIS — K209 Esophagitis, unspecified without bleeding: Secondary | ICD-10-CM | POA: Insufficient documentation

## 2022-01-21 DIAGNOSIS — C321 Malignant neoplasm of supraglottis: Secondary | ICD-10-CM | POA: Diagnosis not present

## 2022-01-21 DIAGNOSIS — R070 Pain in throat: Secondary | ICD-10-CM | POA: Insufficient documentation

## 2022-01-21 DIAGNOSIS — Z923 Personal history of irradiation: Secondary | ICD-10-CM | POA: Diagnosis not present

## 2022-01-21 DIAGNOSIS — C3432 Malignant neoplasm of lower lobe, left bronchus or lung: Secondary | ICD-10-CM | POA: Insufficient documentation

## 2022-01-21 DIAGNOSIS — Z79899 Other long term (current) drug therapy: Secondary | ICD-10-CM | POA: Diagnosis not present

## 2022-01-21 NOTE — Progress Notes (Signed)
Radiation Oncology         [phone] ________________________________  Name: DERENDA GIDDINGS MRN: [id number]  Date: 01/21/2022  DOB: 16-Oct-1950  Follow-Up Visit Note  Outpatient  CC: [name], MD  Melida Quitter, MD  Diagnosis and Prior Radiotherapy:    ICD-10-CM   1. 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 Ambulatory referral to Physical Therapy      Radiation Treatment Dates: 08/16/2021 through 09/13/2021 Site Technique Total Dose (Gy) Dose per Fx (Gy) Completed Fx Beam Energies  Larynx: HN_Larynx IMRT 54/54 2.7 20/20 6X     Radiation Treatment Dates: 11/03/2021 through 12/16/2021   Site/ Technique: Thorax Chest_LLL/ IMRT  (lung mass and all regional adenopathy, including L SCV, included)  Dose 60Gy in  30 fractions at 2 Gy/ fx   CHIEF COMPLAINT: Here for follow-up and surveillance of lung and throat cancer  Narrative:  The patient returns today for routine follow-up.  Ms. Tuohy presents today for follow after completing radiation to her left lung/subclavian (also completed radiation to her larynx on 09/13/2021)  Pain issues, if any: Continues to deal with throat pain Respiratory: Denies any wheezing or shortness of breath. Reports the hazy weather is bothering her more than usual Using a feeding tube?: N/A Weight changes, if any:  Wt Readings from Last 3 Encounters:  01/21/22 187 lb (84.8 kg)  01/20/22 184 lb 4.8 oz (83.6 kg)  01/13/22 187 lb 9.6 oz (85.1 kg)   Swallowing issues, if any: Continues to struggle but is managing better, and diligent about pushing liquids. Smoking or chewing tobacco? None Last ENT visit was on: Scheduled to see Dr. Melida Quitter on 02/27/2022 Other notable issues, if any: Transferred care to medical oncologist Dr. Derek Jack  01/20/2022 PLAN:  Stage III squamous cell lung cancer: - She is 5 weeks status post completion of radiation  therapy.  Unfortunately could not receive last 2 cycles of chemotherapy. - Recommend PET CT scan to evaluate response. - If the scan shows at least partial response, we will proceed with consolidation with durvalumab for 1 year. - We discussed side effects of immunotherapy in detail. - RTC after scan to initiate therapy. - Due to personal history of 3 different type of malignancies, recommend genetic testing. - Also recommend repeating MRI which showed questionable bone lesion in the past. 2.  Supraglottic squamous cell carcinoma (T1N1): - Definitive radiation from 08/16/2021 through 09/13/2021 - We will follow-up on the PET scan. 3.  Throat pain and sternal chest pain: - Continue hydrocodone liquid 10 mg every 12 hours as needed.  Scheduled for a PET on 01/27/22 and MRI of brain w/ & w/o contrast on 02/02/2022                               ALLERGIES:  has No Known Allergies.  Meds: Current Outpatient Medications  Medication Sig Dispense Refill   albuterol (PROVENTIL HFA;VENTOLIN HFA) 108 (90 BASE) MCG/ACT inhaler Inhale 2 puffs into the lungs every 6 (six) hours as needed for wheezing or shortness of breath.      ALPRAZolam (XANAX) 0.5 MG tablet Take 0.5 mg by mouth 3 (three) times daily.     ANORO ELLIPTA 62.5-25 MCG/INH AEPB Inhale 1 puff into the lungs daily.     aspirin EC 325 MG EC tablet Take 1 tablet (  325 mg total) by mouth daily with breakfast. Must take at least 4 weeks postop for DVT prophylaxis 30 tablet 0   calcium carbonate (TUMS - DOSED IN MG ELEMENTAL CALCIUM) 500 MG chewable tablet Chew 2 tablets by mouth 3 (three) times daily as needed for indigestion or heartburn.     fluconazole (DIFLUCAN) 100 MG tablet Take 2 tablets today, then 1 tablet daily x 20 more days. 22 tablet 0   furosemide (LASIX) 20 MG tablet Take 20 mg by mouth daily.     hydrochlorothiazide (HYDRODIURIL) 12.5 MG tablet Take 12.5 mg by mouth daily.     HYDROcodone-acetaminophen (HYCET) 7.5-325 mg/15 ml  solution Take 10 mLs by mouth every 12 (twelve) hours as needed for moderate pain. 473 mL 0   ibuprofen (ADVIL) 800 MG tablet Take 800 mg by mouth every 8 (eight) hours as needed for moderate pain.     lidocaine (XYLOCAINE) 2 % solution Patient: Mix 1part 2% viscous lidocaine, 1part H20. Swallow 61mL of diluted mixture, 14min before meals and at bedtime, up to QID for sore throat. 200 mL 3   lidocaine-prilocaine (EMLA) cream Apply 1 application. topically as needed. 30 g 0   Menthol, Topical Analgesic, (BIOFREEZE EX) Apply 1 application topically daily as needed (pain).     nystatin (MYCOSTATIN) 100000 UNIT/ML suspension Take 5 mLs (500,000 Units total) by mouth 4 (four) times daily. 60 mL 0   ondansetron (ZOFRAN) 8 MG tablet Take 1 tablet (8 mg total) by mouth every 8 (eight) hours as needed for nausea or vomiting. 60 tablet 0   pantoprazole (PROTONIX) 40 MG tablet Take 1 tablet by mouth once daily 90 tablet 0   potassium chloride (KLOR-CON) 20 MEQ packet Take 20 mEq by mouth 2 (two) times daily for 15 days. Dissolve in at least 5 ounces cold water or other beverage and take after meals 30 packet 0   prochlorperazine (COMPAZINE) 10 MG tablet Take 1 tablet (10 mg total) by mouth every 6 (six) hours as needed for nausea or vomiting. 60 tablet 0   sertraline (ZOLOFT) 100 MG tablet Take 100 mg by mouth daily.     sucralfate (CARAFATE) 1 g tablet Dissolve 1 tablet in 10 mL H20 and swallow 30 min prior to meals and bedtime. 40 tablet 3   No current facility-administered medications for this encounter.    Physical Findings: The patient is in no acute distress. Patient is alert and oriented.  height is 5' 1.5" (1.562 m) and weight is 187 lb (84.8 kg). Her oral temperature is 98.5 F (36.9 C). Her blood pressure is 121/77 and her pulse is 83. Her respiration is 18 and oxygen saturation is 96%. .    Neck: no adenopathy, mild lymphedema noted in anterior neck HEENT: no oral lesions Chest CTAB Heart  RRR Skin: intact over neck, upper chest   Lab Findings: Lab Results  Component Value Date   WBC 5.2 01/13/2022   HGB 13.7 01/13/2022   HCT 39.4 01/13/2022   MCV 95.9 01/13/2022   PLT 210 01/13/2022    Radiographic Findings: No results found.  Impression/Plan:  Healing well from RT with some lingering symptoms of esophagitis.  She will resume sucralfate QID.  She has restaging PET and MRI brain next month. We are going to try to change her PET to include her entire skull (whole body) so that it images the skull lesion that was seen on MRI in Feb.  Referral to PT for lymphedema in neck.  I will see her back in mid November with TSH at that time.  She is pleased w/ this plan and will see med onc and ENT in the interim.  On date of service, in total, I spent 30 minutes on this encounter. Patient was seen in person.  _____________________________________   Eppie Gibson, MD

## 2022-01-27 ENCOUNTER — Telehealth (HOSPITAL_COMMUNITY): Payer: Self-pay | Admitting: Dietician

## 2022-01-27 ENCOUNTER — Encounter (HOSPITAL_COMMUNITY): Payer: Medicare HMO | Admitting: Dietician

## 2022-01-27 ENCOUNTER — Ambulatory Visit (HOSPITAL_COMMUNITY): Payer: Medicare HMO

## 2022-01-27 ENCOUNTER — Encounter (HOSPITAL_COMMUNITY): Payer: Self-pay

## 2022-01-27 ENCOUNTER — Ambulatory Visit (HOSPITAL_COMMUNITY)
Admission: RE | Admit: 2022-01-27 | Discharge: 2022-01-27 | Disposition: A | Discharge status: Home / Self Care | Payer: Medicare HMO | Source: Ambulatory Visit | Attending: Radiation Oncology | Admitting: Radiation Oncology

## 2022-01-27 DIAGNOSIS — C3432 Malignant neoplasm of lower lobe, left bronchus or lung: Secondary | ICD-10-CM

## 2022-01-27 DIAGNOSIS — C321 Malignant neoplasm of supraglottis: Secondary | ICD-10-CM

## 2022-01-27 DIAGNOSIS — C760 Malignant neoplasm of head, face and neck: Secondary | ICD-10-CM | POA: Diagnosis not present

## 2022-01-27 MED ORDER — FLUDEOXYGLUCOSE F - 18 (FDG) INJECTION
9.9100 | Freq: Once | INTRAVENOUS | Status: AC | PRN
  Administered 2022-01-27: 9.91 via INTRAVENOUS

## 2022-01-27 NOTE — Telephone Encounter (Signed)
Nutrition Follow-up:  Patient with stage III lung cancer. She completed chemo radiation therapy (4/12-5/25) with weekly carbo/paclitaxel started 4/10. Last chemotherapy 5/2 secondary to thrombocytopenia. Plans to start immunotherapy under the care of Dr. Delton Coombes (first treatment planned 7/11).  S/p definitive radiation 08/16/21-09/13/21 for supraglottic SCC under the care of Dr. Isidore Moos.  Spoke with patient via telephone. She reports feeling "very tired" today s/p PET scan this morning. Patient tried to eat eggs and sausage after leaving hospital but "it wouldn't go down." Patient says the sausage felt stuck in her chest. Patient reports ongoing sore throat and lymphedema that is "quite painful." Patient reports tolerating soft moist foods. Patient reports carafate prior to meals as well as "a lot of tums." She is working to increase water. Patient reports drinking 3 (16 oz) Sundrop/day. This has a lot of sugar, but it taste so good. Patient has never tried oral nutrition supplements.    Medications: Hycet, carafate, klor-con  Labs: 6/22 labs reviewed   Anthropometrics: Last weight 187 lb on 6/30  6/22 - 187 lb 9.6 oz  5/24 - 193 lb 12.8 oz  4/25 - 209 lb 4.8 oz   NUTRITION DIAGNOSIS: Unintended weight loss stable    INTERVENTION:  Encouraged soft smooth textures high in calories and protein - offered suggestions, will provide handout + shake recipes Recommend pt drink oral nutrition supplements to prevent further weight loss - will leave samples of Ensure Complete + CIB  Handouts and samples left at registration desk for pt to pick up on 7/11 (pt is aware)    MONITORING, EVALUATION, GOAL: weight trends, intake    NEXT VISIT: Monday July 17 via telephone

## 2022-01-31 ENCOUNTER — Other Ambulatory Visit (HOSPITAL_COMMUNITY): Payer: Self-pay

## 2022-01-31 DIAGNOSIS — Z170 Estrogen receptor positive status [ER+]: Secondary | ICD-10-CM

## 2022-01-31 NOTE — Progress Notes (Signed)
Pharmacist Chemotherapy Monitoring - Initial Assessment    Anticipated start date: 02/01/2022   The following has been reviewed per standard work regarding the patient's treatment regimen: The patient's diagnosis, treatment plan and drug doses, and organ/hematologic function Lab orders and baseline tests specific to treatment regimen  The treatment plan start date, drug sequencing, and pre-medications Prior authorization status  Patient's documented medication list, including drug-drug interaction screen and prescriptions for anti-emetics and supportive care specific to the treatment regimen The drug concentrations, fluid compatibility, administration routes, and timing of the medications to be used The patient's access for treatment and lifetime cumulative dose history, if applicable  The patient's medication allergies and previous infusion related reactions, if applicable   Changes made to treatment plan:  Lab ordered TSH and T4 for Imfinzi baseline  Follow up needed:  N/A   Wynona Neat, [facility], 01/31/2022  2:03 PM

## 2022-02-01 ENCOUNTER — Inpatient Hospital Stay (HOSPITAL_COMMUNITY): Payer: Medicare HMO

## 2022-02-01 ENCOUNTER — Inpatient Hospital Stay (HOSPITAL_BASED_OUTPATIENT_CLINIC_OR_DEPARTMENT_OTHER): Payer: Medicare HMO | Admitting: Hematology

## 2022-02-01 ENCOUNTER — Inpatient Hospital Stay (HOSPITAL_COMMUNITY): Payer: Medicare HMO | Attending: Hematology

## 2022-02-01 VITALS — BP 114/64 | HR 78 | Temp 97.5°F | Resp 18

## 2022-02-01 DIAGNOSIS — I700 Atherosclerosis of aorta: Secondary | ICD-10-CM | POA: Diagnosis not present

## 2022-02-01 DIAGNOSIS — D696 Thrombocytopenia, unspecified: Secondary | ICD-10-CM | POA: Insufficient documentation

## 2022-02-01 DIAGNOSIS — E876 Hypokalemia: Secondary | ICD-10-CM | POA: Insufficient documentation

## 2022-02-01 DIAGNOSIS — I10 Essential (primary) hypertension: Secondary | ICD-10-CM | POA: Diagnosis not present

## 2022-02-01 DIAGNOSIS — R490 Dysphonia: Secondary | ICD-10-CM | POA: Insufficient documentation

## 2022-02-01 DIAGNOSIS — Z8041 Family history of malignant neoplasm of ovary: Secondary | ICD-10-CM | POA: Insufficient documentation

## 2022-02-01 DIAGNOSIS — Z808 Family history of malignant neoplasm of other organs or systems: Secondary | ICD-10-CM | POA: Diagnosis not present

## 2022-02-01 DIAGNOSIS — Z923 Personal history of irradiation: Secondary | ICD-10-CM | POA: Insufficient documentation

## 2022-02-01 DIAGNOSIS — Z79899 Other long term (current) drug therapy: Secondary | ICD-10-CM | POA: Diagnosis not present

## 2022-02-01 DIAGNOSIS — Z170 Estrogen receptor positive status [ER+]: Secondary | ICD-10-CM | POA: Diagnosis not present

## 2022-02-01 DIAGNOSIS — C321 Malignant neoplasm of supraglottis: Secondary | ICD-10-CM | POA: Diagnosis not present

## 2022-02-01 DIAGNOSIS — Z79811 Long term (current) use of aromatase inhibitors: Secondary | ICD-10-CM | POA: Diagnosis not present

## 2022-02-01 DIAGNOSIS — E049 Nontoxic goiter, unspecified: Secondary | ICD-10-CM | POA: Diagnosis not present

## 2022-02-01 DIAGNOSIS — Z800 Family history of malignant neoplasm of digestive organs: Secondary | ICD-10-CM | POA: Diagnosis not present

## 2022-02-01 DIAGNOSIS — C50412 Malignant neoplasm of upper-outer quadrant of left female breast: Secondary | ICD-10-CM | POA: Diagnosis not present

## 2022-02-01 DIAGNOSIS — C3490 Malignant neoplasm of unspecified part of unspecified bronchus or lung: Secondary | ICD-10-CM

## 2022-02-01 DIAGNOSIS — M25512 Pain in left shoulder: Secondary | ICD-10-CM | POA: Diagnosis not present

## 2022-02-01 DIAGNOSIS — J029 Acute pharyngitis, unspecified: Secondary | ICD-10-CM | POA: Diagnosis not present

## 2022-02-01 DIAGNOSIS — Z801 Family history of malignant neoplasm of trachea, bronchus and lung: Secondary | ICD-10-CM | POA: Diagnosis not present

## 2022-02-01 DIAGNOSIS — R519 Headache, unspecified: Secondary | ICD-10-CM | POA: Insufficient documentation

## 2022-02-01 DIAGNOSIS — C3432 Malignant neoplasm of lower lobe, left bronchus or lung: Secondary | ICD-10-CM | POA: Diagnosis not present

## 2022-02-01 DIAGNOSIS — M25542 Pain in joints of left hand: Secondary | ICD-10-CM | POA: Insufficient documentation

## 2022-02-01 DIAGNOSIS — Z8719 Personal history of other diseases of the digestive system: Secondary | ICD-10-CM | POA: Insufficient documentation

## 2022-02-01 DIAGNOSIS — K219 Gastro-esophageal reflux disease without esophagitis: Secondary | ICD-10-CM | POA: Insufficient documentation

## 2022-02-01 DIAGNOSIS — Z9049 Acquired absence of other specified parts of digestive tract: Secondary | ICD-10-CM | POA: Insufficient documentation

## 2022-02-01 DIAGNOSIS — J439 Emphysema, unspecified: Secondary | ICD-10-CM | POA: Diagnosis not present

## 2022-02-01 LAB — COMPREHENSIVE METABOLIC PANEL
ALT: 11 U/L (ref 0–44)
AST: 13 U/L — ABNORMAL LOW (ref 15–41)
Albumin: 3.5 g/dL (ref 3.5–5.0)
Alkaline Phosphatase: 58 U/L (ref 38–126)
Anion gap: 4 — ABNORMAL LOW (ref 5–15)
BUN: 16 mg/dL (ref 8–23)
CO2: 30 mmol/L (ref 22–32)
Calcium: 9.1 mg/dL (ref 8.9–10.3)
Chloride: 102 mmol/L (ref 98–111)
Creatinine, Ser: 0.82 mg/dL (ref 0.44–1.00)
GFR, Estimated: >60 mL/min (ref >60)
Glucose, Bld: 108 mg/dL — ABNORMAL HIGH (ref 70–99)
Potassium: 2.7 mmol/L — CL (ref 3.5–5.1)
Sodium: 136 mmol/L (ref 135–145)
Total Bilirubin: 2.3 mg/dL — ABNORMAL HIGH (ref 0.3–1.2)
Total Protein: 6.7 g/dL (ref 6.5–8.1)

## 2022-02-01 LAB — CBC WITH DIFFERENTIAL/PLATELET
Abs Immature Granulocytes: 0.02 10*3/uL (ref 0.00–0.07)
Basophils Absolute: 0 10*3/uL (ref 0.0–0.1)
Basophils Relative: 1 %
Eosinophils Absolute: 0.1 10*3/uL (ref 0.0–0.5)
Eosinophils Relative: 3 %
HCT: 37.4 % (ref 36.0–46.0)
Hemoglobin: 12.9 g/dL (ref 12.0–15.0)
Immature Granulocytes: 0 %
Lymphocytes Relative: 13 %
Lymphs Abs: 0.7 10*3/uL (ref 0.7–4.0)
MCH: 32.2 pg (ref 26.0–34.0)
MCHC: 34.5 g/dL (ref 30.0–36.0)
MCV: 93.3 fL (ref 80.0–100.0)
Monocytes Absolute: 0.4 10*3/uL (ref 0.1–1.0)
Monocytes Relative: 7 %
Neutro Abs: 4 10*3/uL (ref 1.7–7.7)
Neutrophils Relative %: 76 %
Platelets: 182 10*3/uL (ref 150–400)
RBC: 4.01 MIL/uL (ref 3.87–5.11)
RDW: 12.3 % (ref 11.5–15.5)
WBC: 5.2 10*3/uL (ref 4.0–10.5)
nRBC: 0 % (ref 0.0–0.2)

## 2022-02-01 LAB — MAGNESIUM: Magnesium: 2 mg/dL (ref 1.7–2.4)

## 2022-02-01 LAB — TSH: TSH: 2.408 u[IU]/mL (ref 0.350–4.500)

## 2022-02-01 MED ORDER — POTASSIUM CHLORIDE 20 MEQ PO PACK
40.0000 meq | PACK | ORAL | Status: AC
  Administered 2022-02-01 (×2): 40 meq via ORAL
  Filled 2022-02-01 (×2): qty 2

## 2022-02-01 MED ORDER — POTASSIUM CHLORIDE 10 MEQ/100ML IV SOLN
10.0000 meq | INTRAVENOUS | Status: AC
  Administered 2022-02-01 (×2): 10 meq via INTRAVENOUS
  Filled 2022-02-01 (×2): qty 100

## 2022-02-01 MED ORDER — SODIUM CHLORIDE 0.9 % IV SOLN
10.0000 mg/kg | Freq: Once | INTRAVENOUS | Status: AC
  Administered 2022-02-01: 860 mg via INTRAVENOUS
  Filled 2022-02-01: qty 7.2

## 2022-02-01 MED ORDER — SODIUM CHLORIDE 0.9 % IV SOLN: Freq: Once | INTRAVENOUS | Status: AC

## 2022-02-01 MED ORDER — HEPARIN SOD (PORK) LOCK FLUSH 100 UNIT/ML IV SOLN
500.0000 [IU] | Freq: Once | INTRAVENOUS | Status: AC | PRN
  Administered 2022-02-01: 500 [IU]

## 2022-02-01 MED ORDER — POTASSIUM CHLORIDE ER 10 MEQ PO TBCR
40.0000 meq | EXTENDED_RELEASE_TABLET | ORAL | Status: AC
  Administered 2022-02-01: 40 meq via ORAL
  Filled 2022-02-01 (×2): qty 4

## 2022-02-01 MED ORDER — SODIUM CHLORIDE 0.9% FLUSH
10.0000 mL | INTRAVENOUS | Status: DC | PRN
  Administered 2022-02-01: 10 mL

## 2022-02-01 NOTE — Progress Notes (Signed)
Patient presents today for D1C1 of treatment, Potassium 2.7 Per Dr. Delton Coombes patient is to receive 40 mEq of PO Potassium, 20 mEQ of IV potassium and 40 mEq of PO potassium. Patient unable to swallow potassium pills, medication switched to dissolvable powder. Patient tolerated therapy with no complaints voiced. Side effects with management reviewed understanding verbalized. Port site clean and dry with no bruising or swelling noted at site. Good blood return noted before and after administration of chemotherapy. Band aid applied. Patient left in satisfactory condition with VSS and no s/s of distress noted.

## 2022-02-01 NOTE — Progress Notes (Signed)
CRITICAL VALUE STICKER  CRITICAL VALUE: Potassium 2.7   RECEIVER (on-site recipient of call): Corene Cornea RN   [place] NOTIFIED:  02/01/22, 11.15 MESSENGER (representative from lab): Cyril Mourning   MD NOTIFIED: Dr. Delton Coombes   TIME OF NOTIFICATION: 11:15  RESPONSE: doctor was notified

## 2022-02-01 NOTE — Patient Instructions (Signed)
[facility] [place]  Discharge Instructions: Thank you for choosing [facility] to provide your oncology and hematology care.  If you have a lab appointment with the [facility], please come in thru the Main Entrance and check in at the main information desk.  Wear comfortable clothing and clothing appropriate for easy access to any Portacath or PICC line.   We strive to give you quality time with your provider. You may need to reschedule your appointment if you arrive late (15 or more minutes).  Arriving late affects you and other patients whose appointments are after yours.  Also, if you miss three or more appointments without notifying the office, you may be dismissed from the clinic at the provider's discretion.      For prescription refill requests, have your pharmacy contact our office and allow 72 hours for refills to be completed.    Today you received the following chemotherapy and/or immunotherapy agents Imfinzi, return as scheduled.   To help prevent nausea and vomiting after your treatment, we encourage you to take your nausea medication as directed.  BELOW ARE SYMPTOMS THAT SHOULD BE REPORTED IMMEDIATELY: *FEVER GREATER THAN 100.4 F (38 C) OR HIGHER *CHILLS OR SWEATING *NAUSEA AND VOMITING THAT IS NOT CONTROLLED WITH YOUR NAUSEA MEDICATION *UNUSUAL SHORTNESS OF BREATH *UNUSUAL BRUISING OR BLEEDING *URINARY PROBLEMS (pain or burning when urinating, or frequent urination) *BOWEL PROBLEMS (unusual diarrhea, constipation, pain near the anus) TENDERNESS IN MOUTH AND THROAT WITH OR WITHOUT PRESENCE OF ULCERS (sore throat, sores in mouth, or a toothache) UNUSUAL RASH, SWELLING OR PAIN  UNUSUAL VAGINAL DISCHARGE OR ITCHING   Items with * indicate a potential emergency and should be followed up as soon as possible or go to the Emergency Department if any problems should occur.  Please show the CHEMOTHERAPY ALERT CARD or IMMUNOTHERAPY ALERT CARD at check-in to the  Emergency Department and triage nurse.  Should you have questions after your visit or need to cancel or reschedule your appointment, please contact [facility] [phone]  and follow the prompts.  Office hours are 8:00 a.m. to 4:30 p.m. Monday - Friday. Please note that voicemails left after 4:00 p.m. may not be returned until the following business day.  We are closed weekends and major holidays. You have access to a nurse at all times for urgent questions. Please call the main number to the clinic [phone] and follow the prompts.  For any non-urgent questions, you may also contact your provider using MyChart. We now offer e-Visits for anyone 64 and older to request care online for non-urgent symptoms. For details visit [email or web address].   Also download the MyChart app! Go to the app store, search "MyChart", open the app, select [facility], and log in with your MyChart username and password.  Masks are optional in the cancer centers. If you would like for your care team to wear a mask while they are taking care of you, please let them know. For doctor visits, patients may have with them one support person who is at least 69 years old. At this time, visitors are not allowed in the infusion area.

## 2022-02-01 NOTE — Patient Instructions (Addendum)
[place] at [facility] Discharge Instructions   You were seen and examined today by Dr. Delton Coombes.  He reviewed your lab work which is normal/stable except your potassium. It is very low. We will give you IV and by mouth potassium in the clinic today. Restart taking the potassium packets as prescribed. We will send refills for this.  We will proceed with your first infusion of Imfinizi today.  Return as scheduled.    Thank you for choosing [place] at [facility] to provide your oncology and hematology care.  To afford each patient quality time with our provider, please arrive at least 15 minutes before your scheduled appointment time.   If you have a lab appointment with the [facility] please come in thru the Main Entrance and check in at the main information desk.  You need to re-schedule your appointment should you arrive 10 or more minutes late.  We strive to give you quality time with our providers, and arriving late affects you and other patients whose appointments are after yours.  Also, if you no show three or more times for appointments you may be dismissed from the clinic at the providers discretion.     Again, thank you for choosing [facility].  Our hope is that these requests will decrease the amount of time that you wait before being seen by our physicians.       _____________________________________________________________  Should you have questions after your visit to [facility], please contact our office at [phone] and follow the prompts.  Our office hours are 8:00 a.m. and 4:30 p.m. Monday - Friday.  Please note that voicemails left after 4:00 p.m. may not be returned until the following business day.  We are closed weekends and major holidays.  You do have access to a nurse 24-7, just call the main number to the clinic [phone] and do not press any options, hold on the line and a nurse  will answer the phone.    For prescription refill requests, have your pharmacy contact our office and allow 72 hours.    Due to Covid, you will need to wear a mask upon entering the hospital. If you do not have a mask, a mask will be given to you at the Main Entrance upon arrival. For doctor visits, patients may have 1 support person age 69 or older with them. For treatment visits, patients can not have anyone with them due to social distancing guidelines and our immunocompromised population.

## 2022-02-01 NOTE — Progress Notes (Signed)
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    06/24/2014 Breast US   Left breast: ill-defined predominantly hypoechoic area with some increased echogenicity in the 2 o'clock position of the left breast, 7 cm from the nipple. 2.6 x 2.3 x 1.8 cm in maximum dimensions.   07/01/2014 Initial Biopsy   Left breast core needle bx: Invasive mammary carcinoma with lobular features, ER+ (100%), PR+ (91%), HER2/neu negative (ratio 1.09), Ki-67 32%. E-cadherin strongly positive, diagnostic for IDC    07/01/2014 Clinical Stage   Stage IIA: T2 N0   08/20/2014 Definitive Surgery   Left breast seed localized lumpectomy/SLNB: IDC, +LVI, DCIS, 1 LN removed and positive for metastatic carcinoma. Grade 2. HER2/neu repeated and negative (ratio 0.69-1.9).    08/20/2014 Pathologic Stage   Stage IIB: pT2 pN1a pMx   09/17/2014 Surgery   Port-a cath placement and re-excision   10/02/2014 - 01/15/2015 Chemotherapy   CMF x 6 cycles.  patient refused Taxane-containing chemotherapy.   03/07/2015 Procedure   Comp Cancer Panel reveals no variant at APC, ATM, AXIN2, BARD1, BMPR1A, BRCA1, BRCA2, BRIP1, CDH1, CDK4, CDKN2A, CHEK2, FANCC, MLH1, MSH2, MSH6, MUTYH, NBN, PALB2, PMS2, POLD1, POLE, PTEN, RAD51C, RAD51D, SMAD4, STK11, TP53, VHL, and XRCC2.    04/06/2015 - 05/21/2015 Radiation Therapy    Adjuvant RT Regina Mcmahon): Left breast/ 45 Gy over 25 fractions.   Left supraclavicular fossa and axilla/ 45 Gy over 25 fractions. Left breast boost/ 16 Gy over 8 fractions. Total dose: 61 Gy   06/04/2015 - 08/07/2015 Anti-estrogen oral therapy   Exemestane 25 mg daily.  Planned duration of therapy 5 years.    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  PET also demonstrated borderline right level 2 lymph node left supraclavicular, mediastinal and hilar lymphadenopathy and hypermetabolic left lower lobe pulmonary nodule all suspicious of metastatic focus.  No abdominal pelvic metastatic disease or osseous metastatic disease was appreciated.   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) Staging form: Larynx - Supraglottis, AJCC 8th Edition - Clinical stage from 07/30/2021: cT1, cN1 - Unsigned  Squamous cell lung cancer ([facility]) Staging form: Lung, AJCC 8th Edition - Clinical stage from 09/08/2021: Stage IIIB (cT1b, cN3, cM0) - Signed by Regina Gibson, MD on 09/08/2021   INTERVAL HISTORY:  Regina Mcmahon, a 71 y.o. female, returns for routine follow-up and consideration for next cycle of chemotherapy. [name] was last seen on 01/20/2022.  Due for cycle #1 of Durvalumab today.   Overall, she tells me she has been feeling pretty well. She continues to have hoarseness. She has not been taking Potassium as she does not like the taste. She continues to take hydrocodone every 12 hours as needed.   Overall, she feels ready for next cycle of chemo today.    REVIEW OF SYSTEMS:  Review of Systems  Constitutional:  Negative for appetite change and fatigue.  HENT:   Positive for sore throat and voice change (hoarse).   Cardiovascular:  Positive for chest pain (reflux).  Musculoskeletal:  Positive for arthralgias (5/10L hand and shoulder).  Neurological:  Positive for headaches.  All other systems reviewed and are negative.   PAST MEDICAL/SURGICAL HISTORY:  Past Medical History:  Diagnosis Date   Arthritis    Asthma    Asymptomatic varicose veins    Breast cancer ([facility]) 06/2014   left   Cancer ([facility])    Supra Glottis   Chronic airway obstruction ([facility])    Complication of anesthesia 2016   difficulty waking up, Oxygen dropped low.   COPD (chronic obstructive pulmonary disease) ([facility])    Depression    Dyspnea     GERD (gastroesophageal reflux disease)    Hemorrhage rectum    and anus.   Hypertension    Insomnia    Other symptoms involving digestive system(787.99)    Pain    Hx.pain in joint involving pelvic region and thigh; pain in limb   Palpitations    Panic attacks    Personal history of chemotherapy 2016   Personal history of radiation therapy 2016   Pneumonia    Pre-diabetes    Prediabetes    Sinusitis    Symptomatic menopausal or female climacteric states    Varicose veins of both lower extremities with pain    Past Surgical History:  Procedure Laterality Date   BREAST BIOPSY Left 10/2012   BREAST BIOPSY Left 07/01/2014   malignant   BREAST LUMPECTOMY Left 08/20/2014   BRONCHIAL BIOPSY  08/23/2021   Procedure: BRONCHIAL BIOPSIES;  Surgeon: Collene Gobble, MD;  Location: [facility];  Service: Pulmonary;;   BRONCHIAL BRUSHINGS  08/23/2021   Procedure: BRONCHIAL BRUSHINGS;  Surgeon: Collene Gobble, MD;  Location: [facility];  Service: Pulmonary;;   BRONCHIAL NEEDLE ASPIRATION BIOPSY  08/23/2021   Procedure: BRONCHIAL NEEDLE ASPIRATION BIOPSIES;  Surgeon: Collene Gobble, MD;  Location: [facility] ENDOSCOPY;  Service: Pulmonary;;   BRONCHIAL WASHINGS  08/23/2021   Procedure: BRONCHIAL WASHINGS;  Surgeon: Collene Gobble, MD;  Location: [facility] ENDOSCOPY;  Service: Pulmonary;;   CESAREAN SECTION     CHOLECYSTECTOMY  1985   COLONOSCOPY  2002   Dr. Lindalou Hose: internal hemorrhoids, one small rectal polyp, adenomatous path    COLONOSCOPY N/A 11/23/2015   Procedure: COLONOSCOPY;  Surgeon: Danie Binder, MD;  Location: [facility];  Service: Endoscopy;  Laterality: N/A;  1100 - moved to 10:45 - office to notify   ESOPHAGOGASTRODUODENOSCOPY N/A 11/23/2015   Procedure: ESOPHAGOGASTRODUODENOSCOPY (EGD);  Surgeon: Danie Binder, MD;  [facility];  Service: Endoscopy;  Laterality: N/A;   IR IMAGING GUIDED PORT INSERTION  11/08/2021   PORT-A-CATH REMOVAL  02/2015   PORTACATH PLACEMENT Right  09/17/2014   Procedure: INSERTION PORT-A-CATH WITH ULTRASOUND;  Surgeon: Erroll Luna, MD;  Location: [facility];  Service: General;  Laterality: Right;  IJ   RADIOACTIVE SEED GUIDED PARTIAL MASTECTOMY WITH AXILLARY SENTINEL LYMPH NODE BIOPSY Left 08/20/2014   Procedure: LEFT BREAST LUMPECTOMY WITH RADIOACTIVE SEED LOCALIZATION AND SENTINEL LYMPH NODE MAPPING;  Surgeon: Erroll Luna, MD;  Location: [facility];  Service: General;  Laterality: Left;   RE-EXCISION OF BREAST LUMPECTOMY Left 09/17/2014   Procedure: RE-EXCISION OF BREAST LUMPECTOMY;  Surgeon: Erroll Luna, MD;  Location: [facility];  Service: General;  Laterality: Left;   TOTAL HIP ARTHROPLASTY Right 02/22/2021   Procedure: RIGHT TOTAL HIP ARTHROPLASTY ANTERIOR APPROACH;  Surgeon: Marybelle Killings, MD;  Location: [facility];  Service: Orthopedics;  Laterality: Right;   TUBAL LIGATION     VIDEO BRONCHOSCOPY WITH ENDOBRONCHIAL ULTRASOUND N/A 08/23/2021   Procedure: VIDEO BRONCHOSCOPY WITH ENDOBRONCHIAL ULTRASOUND;  Surgeon: Collene Gobble, MD;  Location: [facility] ENDOSCOPY;  Service: Pulmonary;  Laterality: N/A;   VIDEO BRONCHOSCOPY WITH RADIAL ENDOBRONCHIAL ULTRASOUND  08/23/2021   Procedure: VIDEO BRONCHOSCOPY WITH RADIAL ENDOBRONCHIAL ULTRASOUND;  Surgeon: Collene Gobble, MD;  Location: [facility];  Service: Pulmonary;;    SOCIAL HISTORY:  Social History   Socioeconomic History   Marital status: Divorced    Spouse name: Not on file   Number of children: 2   Years of education: Not on file   Highest education level: Not on file  Occupational History   Not on file  Tobacco Use   Smoking status: Former    Packs/day: 1.00    Years: 42.00    Total pack years: 42.00    Types: Cigarettes    Quit date: 02/22/2021    Years since quitting: 0.9   Smokeless tobacco: Never  Vaping Use   Vaping Use: Never used  Substance and Sexual Activity   Alcohol use: No    Alcohol/week: 0.0 standard drinks of  alcohol   Drug use: No   Sexual activity: Never    Birth control/protection: None  Other Topics Concern   Not on file  Social History Narrative   Not on file   Social Determinants of Health   Financial Resource Strain: Not on file  Food Insecurity: Not on file  Transportation Needs: Not on file  Physical Activity: Not on file  Stress: Not on file  Social Connections: Unknown (08/05/2021)   Social Connection and Isolation Panel [NHANES]    Frequency of Communication with Friends  and Family: More than three times a week    Frequency of Social Gatherings with Friends and Family: More than three times a week    Attends Religious Services: Not on Advertising copywriter or Organizations: Not on file    Attends Archivist Meetings: Not on file    Marital Status: Not on file  Intimate Partner Violence: Not on file    FAMILY HISTORY:  Family History  Problem Relation Age of Onset   Diabetes Mother    Ovarian cancer Mother 79   Other Father        died in MVA at age 35   Other Sister        mitral valve disorder   Melanoma Sister 27       removed from back of leg   Colon cancer Brother        early 34s, succumbed to disease   Cancer Paternal Aunt        leukemia, bone, or lung cancer   Alzheimer's disease Maternal Grandmother    Heart attack Maternal Grandfather    Heart attack Paternal Grandmother    COPD Paternal Grandfather    Emphysema Paternal Grandfather    Congestive Heart Failure Paternal Aunt    Stomach cancer Paternal Uncle    Kidney cancer Maternal Uncle    Cancer Cousin        dx. teens/dx. 14s   Cancer Cousin    Colon cancer Cousin     CURRENT MEDICATIONS:  Current Outpatient Medications  Medication Sig Dispense Refill   albuterol (PROVENTIL HFA;VENTOLIN HFA) 108 (90 BASE) MCG/ACT inhaler Inhale 2 puffs into the lungs every 6 (six) hours as needed for wheezing or shortness of breath.      ALPRAZolam (XANAX) 0.5 MG tablet Take 0.5 mg by  mouth 3 (three) times daily.     ANORO ELLIPTA 62.5-25 MCG/INH AEPB Inhale 1 puff into the lungs daily.     aspirin EC 325 MG EC tablet Take 1 tablet (325 mg total) by mouth daily with breakfast. Must take at least 4 weeks postop for DVT prophylaxis 30 tablet 0   calcium carbonate (TUMS - DOSED IN MG ELEMENTAL CALCIUM) 500 MG chewable tablet Chew 2 tablets by mouth 3 (three) times daily as needed for indigestion or heartburn.     fluconazole (DIFLUCAN) 100 MG tablet Take 2 tablets today, then 1 tablet daily x 20 more days. 22 tablet 0   furosemide (LASIX) 20 MG tablet Take 20 mg by mouth daily.     hydrochlorothiazide (HYDRODIURIL) 12.5 MG tablet Take 12.5 mg by mouth daily.     HYDROcodone-acetaminophen (HYCET) 7.5-325 mg/15 ml solution Take 10 mLs by mouth every 12 (twelve) hours as needed for moderate pain. 473 mL 0   ibuprofen (ADVIL) 800 MG tablet Take 800 mg by mouth every 8 (eight) hours as needed for moderate pain.     lidocaine (XYLOCAINE) 2 % solution Patient: Mix 1part 2% viscous lidocaine, 1part H20. Swallow 35mL of diluted mixture, 49min before meals and at bedtime, up to QID for sore throat. 200 mL 3   lidocaine-prilocaine (EMLA) cream Apply 1 application. topically as needed. 30 g 0   Menthol, Topical Analgesic, (BIOFREEZE EX) Apply 1 application topically daily as needed (pain).     nystatin (MYCOSTATIN) 100000 UNIT/ML suspension Take 5 mLs (500,000 Units total) by mouth 4 (four) times daily. 60 mL 0   ondansetron (ZOFRAN) 8 MG tablet Take 1  tablet (8 mg total) by mouth every 8 (eight) hours as needed for nausea or vomiting. 60 tablet 0   pantoprazole (PROTONIX) 40 MG tablet Take 1 tablet by mouth once daily 90 tablet 0   potassium chloride (KLOR-CON) 20 MEQ packet Take 20 mEq by mouth 2 (two) times daily for 15 days. Dissolve in at least 5 ounces cold water or other beverage and take after meals 30 packet 0   prochlorperazine (COMPAZINE) 10 MG tablet Take 1 tablet (10 mg total) by  mouth every 6 (six) hours as needed for nausea or vomiting. 60 tablet 0   sertraline (ZOLOFT) 100 MG tablet Take 100 mg by mouth daily.     sucralfate (CARAFATE) 1 g tablet Dissolve 1 tablet in 10 mL H20 and swallow 30 min prior to meals and bedtime. 40 tablet 3   No current facility-administered medications for this visit.    ALLERGIES:  No Known Allergies  PHYSICAL EXAM:  Performance status (ECOG): 1 - Symptomatic but completely ambulatory  There were no vitals filed for this visit. Wt Readings from Last 3 Encounters:  02/01/22 186 lb 9.6 oz (84.6 kg)  01/21/22 187 lb (84.8 kg)  01/20/22 184 lb 4.8 oz (83.6 kg)   Physical Exam Vitals reviewed.  Constitutional:      Appearance: Normal appearance. She is obese.  Cardiovascular:     Rate and Rhythm: Normal rate and regular rhythm.     Pulses: Normal pulses.     Heart sounds: Normal heart sounds.  Pulmonary:     Effort: Pulmonary effort is normal.     Breath sounds: Normal breath sounds.  Neurological:     General: No focal deficit present.     Mental Status: She is alert and oriented to person, place, and time.  Psychiatric:        Mood and Affect: Mood normal.        Behavior: Behavior normal.     LABORATORY DATA:  I have reviewed the labs as listed.     Latest Ref Rng & Units 02/01/2022    9:55 AM 01/13/2022    2:02 PM 12/29/2021    1:25 PM  CBC  WBC 4.0 - 10.5 K/uL 5.2  5.2  3.5   Hemoglobin 12.0 - 15.0 g/dL 12.9  13.7  10.7   Hematocrit 36.0 - 46.0 % 37.4  39.4  30.8   Platelets 150 - 400 K/uL 182  210  193       Latest Ref Rng & Units 01/13/2022    2:02 PM 12/29/2021    1:25 PM 12/13/2021   11:17 AM  CMP  Glucose 70 - 99 mg/dL 107  109  126   BUN 8 - 23 mg/dL $Remove'12  19  16   'CgRKjTz$ Creatinine 0.44 - 1.00 mg/dL 0.91  0.67  0.76   Sodium 135 - 145 mmol/L 139  140  140   Potassium 3.5 - 5.1 mmol/L 4.4  2.6  3.3   Chloride 98 - 111 mmol/L 102  97  100   CO2 22 - 32 mmol/L 33  34  35   Calcium 8.9 - 10.3 mg/dL 10.3  9.9   9.6   Total Protein 6.5 - 8.1 g/dL 7.4  7.1  6.9   Total Bilirubin 0.3 - 1.2 mg/dL 1.9  1.8  2.8   Alkaline Phos 38 - 126 U/L 65  67  72   AST 15 - 41 U/L 11  14  10  ALT 0 - 44 U/L $Remo'6  11  8     'IvVDy$ DIAGNOSTIC IMAGING:  I have independently reviewed the scans and discussed with the patient. NM PET Image Restage (PS) Whole Body  Result Date: 01/28/2022 CLINICAL DATA:  Subsequent treatment strategy for head neck cancer. EXAM: NUCLEAR MEDICINE PET WHOLE BODY TECHNIQUE: 9.91 mCi F-18 FDG was injected intravenously. Full-ring PET imaging was performed from the head to foot after the radiotracer. CT data was obtained and used for attenuation correction and anatomic localization. Fasting blood glucose: 08/25/2021 mg/dl COMPARISON:  Multiple prior studies most recent is a chest CT from October 18, 2021, most recent PET exam for comparison is December of 2022. FINDINGS: Mediastinal blood pool activity: SUV max 2.64 HEAD/NECK: No hypermetabolic foci in the neck. Symmetric activity at the base of tongue bilaterally is more likely physiologic and without associated is well lesion. Incidental CT findings: none CHEST: Marked interval decrease in size of mediastinal lymph nodes since the more recent CT of the chest. Also diminished size and activity when compared to the PET exam from December of 2022. (Image 92/3) 7 mm LEFT supraclavicular lymph node is stable compared to recent CT imaging and displays a maximum SUV of 3.72, previously 8.81 in December of 2022. Diminished size of mediastinal lymph nodes in the precarinal region and RIGHT paratracheal chain, for example on image 116/3 a lymph node currently measuring 9 mm adjacent to a similar size lymph node with a maximum SUV of 7.61 in this location previously with a maximum SUV of 13.98 in December and measuring as much as 2 cm on the recent comparison CT. (Image 125/3) subcarinal nodal tissue at 13 mm previously 21 mm short axis showing a maximum SUV of 5.53 as compared  to 15.4 on imaging from December of 2022. Subtle activity about the LEFT hilum and prevascular regions. Pre-vascular lymph node measuring 4 mm (image 119/3) previously 11 mm in March of 2023. No signs of new adenopathy since the March exam. No evidence of pulmonary nodules with increased metabolic activity. Incidental CT findings: Subtle nodularity along the pleural surface in the LEFT upper lobe is stable largest approximately 5 mm (image 26/9). Signs of pulmonary emphysema as before. RIGHT thyroid enlargement and heterogeneity is similar to prior imaging and without increased metabolic activity. Signs of aortic atherosclerosis and coronary artery disease also similar. ABDOMEN/PELVIS: No abnormal hypermetabolic activity within the liver, pancreas, adrenal glands, or spleen. No hypermetabolic lymph nodes in the abdomen or pelvis. Incidental CT findings: Post cholecystectomy. No acute process related to liver, pancreas, spleen, adrenal glands, kidneys, stomach, small and large bowel. Appendix is normal. Aortic atherosclerosis without aneurysm. Reproductive structures are grossly unremarkable. No adenopathy by size criteria in the abdomen or in the pelvis. SKELETON: No focal hypermetabolic activity to suggest skeletal metastasis. Incidental CT findings: Signs of increased activity about the patient's RIGHT hip arthroplasty is unchanged likely reactive changes. EXTREMITIES: No abnormal hypermetabolic activity in the lower extremities. Incidental CT findings: none IMPRESSION: Interval response to therapy with decreasing size of mediastinal lymph nodes and marked decrease in metabolic activity compared to more remote imaging from December as discussed. No evidence of new disease in the neck, chest, abdomen or pelvis. Aortic Atherosclerosis (ICD10-I70.0) and Emphysema (ICD10-J43.9). Electronically Signed   By: Zetta Bills M.D.   On: 01/28/2022 09:08     ASSESSMENT:  Stage III squamous cell cancer: - LLL lung mass  FNA (08/23/2021): Malignant cells consistent with squamous cell carcinoma. - Lymph node 4R and  7: Malignant cells consistent with NSCLC. - Chemoradiation therapy with weekly carboplatin and paclitaxel from 11/01/2021, week for chemotherapy on 11/23/2021.  XRT completed on 12/16/2021. - She missed further doses of chemotherapy secondary to thrombocytopenia.    Social/family history: - Seen today with her Sister Neoma Laming.  Patient lives by herself.  Quit smoking on 02/22/2021.  Smoked 2 packs/day for 40 years. - Mother died of ovarian cancer.  Brother died of colon cancer at age 63.  Sister had melanoma.  Paternal uncles had stomach cancer and lung cancer.  Paternal aunt had thyroid cancer.  Another paternal aunt had pancreatic cancer.  3.  Supraglottic squamous cell carcinoma (T1N1): - Definitive radiation from 08/16/2021 through 09/13/2021  PLAN:  Stage III squamous cell lung cancer: - I have reviewed PET scan (01/27/2022): Interval response to therapy with decreasing size of mediastinal lymph nodes.  No new disease. - I have recommended consolidation with immunotherapy durvalumab every 2 weeks. - We discussed side effects in detail. - Reviewed labs today which shows normal LFTs.  CBC was normal.  TSH is 2.4.  Proceed with first cycle of Imfinzi today and in 2 weeks. - RTC 4 weeks for follow-up.  2.  Supraglottic squamous cell carcinoma (T1N1): -PET scan on 01/27/2022 with no evidence of disease.  3.  Throat pain and sternal chest pain: - Continue hydrocodone liquid 10 mg every 12 hours as needed.  4.  Hypokalemia: - She stopped taking home potassium.  Today potassium is 2.7.  She will have IV potassium. - She will start back on potassium 20 mEq daily.   Orders placed this encounter:  No orders of the defined types were placed in this encounter.    Derek Jack, MD [place] [phone]   I, Thana Ates, am acting as a scribe for Dr. Derek Jack.  I,  Derek Jack MD, have reviewed the above documentation for accuracy and completeness, and I agree with the above.

## 2022-02-02 ENCOUNTER — Ambulatory Visit (HOSPITAL_COMMUNITY)
Admission: RE | Admit: 2022-02-02 | Discharge: 2022-02-02 | Disposition: A | Discharge status: Home / Self Care | Payer: Medicare HMO | Source: Ambulatory Visit | Attending: Hematology | Admitting: Hematology

## 2022-02-02 DIAGNOSIS — Z853 Personal history of malignant neoplasm of breast: Secondary | ICD-10-CM | POA: Diagnosis not present

## 2022-02-02 DIAGNOSIS — C3490 Malignant neoplasm of unspecified part of unspecified bronchus or lung: Secondary | ICD-10-CM | POA: Diagnosis not present

## 2022-02-02 DIAGNOSIS — C50412 Malignant neoplasm of upper-outer quadrant of left female breast: Secondary | ICD-10-CM | POA: Insufficient documentation

## 2022-02-02 DIAGNOSIS — Z170 Estrogen receptor positive status [ER+]: Secondary | ICD-10-CM | POA: Insufficient documentation

## 2022-02-02 DIAGNOSIS — Z1231 Encounter for screening mammogram for malignant neoplasm of breast: Secondary | ICD-10-CM | POA: Diagnosis not present

## 2022-02-02 LAB — T4: T4, Total: 7.5 ug/dL (ref 4.5–12.0)

## 2022-02-02 MED ORDER — GADOBUTROL 1 MMOL/ML IV SOLN
8.5000 mL | Freq: Once | INTRAVENOUS | Status: AC | PRN
Start: 2022-02-02 — End: 2022-02-02
  Administered 2022-02-02: 8.5 mL via INTRAVENOUS

## 2022-02-03 ENCOUNTER — Telehealth (HOSPITAL_COMMUNITY): Payer: Self-pay | Admitting: *Deleted

## 2022-02-03 NOTE — Telephone Encounter (Signed)
Call placed to pt after receiving chemotherapy. Pt denies shortness of breath, headaches, nausea, and vomiting. Pt advised to clinic if needed, pt verbalized understanding.

## 2022-02-07 ENCOUNTER — Telehealth (HOSPITAL_COMMUNITY): Payer: Self-pay | Admitting: Dietician

## 2022-02-07 ENCOUNTER — Telehealth (HOSPITAL_COMMUNITY): Payer: Self-pay | Admitting: *Deleted

## 2022-02-07 ENCOUNTER — Encounter (HOSPITAL_COMMUNITY): Payer: Medicare HMO | Admitting: Dietician

## 2022-02-07 ENCOUNTER — Other Ambulatory Visit (HOSPITAL_COMMUNITY): Payer: Self-pay | Admitting: *Deleted

## 2022-02-07 MED ORDER — HYDROCODONE-ACETAMINOPHEN 7.5-325 MG/15ML PO SOLN: 10.0000 mL | Freq: Four times a day (QID) | ORAL | 0 refills | Status: DC | PRN

## 2022-02-07 NOTE — Telephone Encounter (Signed)
Nutrition Follow-up:  Patient with stage III lung cancer. She completed chemo radiation therapy (4/12-5/25) with weekly carbo/paclitaxel started 4/10. Last chemotherapy 5/2 secondary to thrombocytopenia. Patient is currently receiving immunotherapy with imfinzi q14d (started 7/11).  Spoke with patient via telephone. Patient reports "right much pain" in her throat today. She took the last of liquid hydrocodone this morning. Patient reports unable to swallow pills, this burns her throat. She is asking about other liquid pain relief options as it is she "does not think" she can have hydrocodone refilled yet. Will request RN to contact. Patient reports appetite is good and eating better. Patient denies swallowing difficulties, states her throat is tight in the mornings but this improves after taking medications. Patient denies nausea, vomiting, diarrhea, constipation. She is taking stool softener daily. Patient reports "dizzy spell" while at the dollar store over the weekend. This lasted ~10 minutes. Patient denies syncope. She has had no further episodes.   Medications: reviewed   Labs: 7/11 - K 2.7 (received 20 mEq IV potassium + 40 mEq oral potassium)   Anthropometrics: Last weight 186 lb 9.6 oz on 7/11 stable x 3 weeks  6/30 - 187 lb  6/22 - 187 lb 9.6 oz   NUTRITION DIAGNOSIS: Unintended weight loss stable    INTERVENTION:  Continue small frequent meals/snacks with adequate calories and protein  Patient forgot to pick up samples + handouts on 7/11. She will pick these up 7/25    MONITORING, EVALUATION, GOAL: weight trends, intake   NEXT VISIT: To be scheduled as needed

## 2022-02-07 NOTE — Telephone Encounter (Signed)
Patient called stating that her throat has been extremely sore and irritated with some difficulty swallowing.  Has history of laryngeal cancer and has been using Hydrocodone liquid for the discomfort.  Script sent for her to have every 6 hours, rather than 12 hours.  No other complaints noted.

## 2022-02-09 ENCOUNTER — Ambulatory Visit: Payer: Medicare HMO | Admitting: Physical Therapy

## 2022-02-14 ENCOUNTER — Other Ambulatory Visit: Payer: Self-pay

## 2022-02-15 ENCOUNTER — Ambulatory Visit: Payer: Medicare HMO | Admitting: Hematology and Oncology

## 2022-02-15 ENCOUNTER — Inpatient Hospital Stay (HOSPITAL_COMMUNITY): Payer: Medicare HMO

## 2022-02-15 VITALS — BP 111/72 | HR 76 | Temp 98.2°F | Resp 20

## 2022-02-15 DIAGNOSIS — R519 Headache, unspecified: Secondary | ICD-10-CM | POA: Diagnosis not present

## 2022-02-15 DIAGNOSIS — D696 Thrombocytopenia, unspecified: Secondary | ICD-10-CM | POA: Diagnosis not present

## 2022-02-15 DIAGNOSIS — Z808 Family history of malignant neoplasm of other organs or systems: Secondary | ICD-10-CM | POA: Diagnosis not present

## 2022-02-15 DIAGNOSIS — Z8719 Personal history of other diseases of the digestive system: Secondary | ICD-10-CM | POA: Diagnosis not present

## 2022-02-15 DIAGNOSIS — R490 Dysphonia: Secondary | ICD-10-CM | POA: Diagnosis not present

## 2022-02-15 DIAGNOSIS — C321 Malignant neoplasm of supraglottis: Secondary | ICD-10-CM | POA: Diagnosis not present

## 2022-02-15 DIAGNOSIS — C3432 Malignant neoplasm of lower lobe, left bronchus or lung: Secondary | ICD-10-CM | POA: Diagnosis not present

## 2022-02-15 DIAGNOSIS — E049 Nontoxic goiter, unspecified: Secondary | ICD-10-CM | POA: Diagnosis not present

## 2022-02-15 DIAGNOSIS — I700 Atherosclerosis of aorta: Secondary | ICD-10-CM | POA: Diagnosis not present

## 2022-02-15 DIAGNOSIS — M25512 Pain in left shoulder: Secondary | ICD-10-CM | POA: Diagnosis not present

## 2022-02-15 DIAGNOSIS — K219 Gastro-esophageal reflux disease without esophagitis: Secondary | ICD-10-CM | POA: Diagnosis not present

## 2022-02-15 DIAGNOSIS — Z79899 Other long term (current) drug therapy: Secondary | ICD-10-CM | POA: Diagnosis not present

## 2022-02-15 DIAGNOSIS — M25542 Pain in joints of left hand: Secondary | ICD-10-CM | POA: Diagnosis not present

## 2022-02-15 DIAGNOSIS — I10 Essential (primary) hypertension: Secondary | ICD-10-CM | POA: Diagnosis not present

## 2022-02-15 DIAGNOSIS — Z800 Family history of malignant neoplasm of digestive organs: Secondary | ICD-10-CM | POA: Diagnosis not present

## 2022-02-15 DIAGNOSIS — C50412 Malignant neoplasm of upper-outer quadrant of left female breast: Secondary | ICD-10-CM | POA: Diagnosis not present

## 2022-02-15 DIAGNOSIS — Z923 Personal history of irradiation: Secondary | ICD-10-CM | POA: Diagnosis not present

## 2022-02-15 DIAGNOSIS — Z9049 Acquired absence of other specified parts of digestive tract: Secondary | ICD-10-CM | POA: Diagnosis not present

## 2022-02-15 DIAGNOSIS — E876 Hypokalemia: Secondary | ICD-10-CM | POA: Diagnosis not present

## 2022-02-15 DIAGNOSIS — Z170 Estrogen receptor positive status [ER+]: Secondary | ICD-10-CM | POA: Diagnosis not present

## 2022-02-15 DIAGNOSIS — Z801 Family history of malignant neoplasm of trachea, bronchus and lung: Secondary | ICD-10-CM | POA: Diagnosis not present

## 2022-02-15 DIAGNOSIS — J029 Acute pharyngitis, unspecified: Secondary | ICD-10-CM | POA: Diagnosis not present

## 2022-02-15 DIAGNOSIS — J439 Emphysema, unspecified: Secondary | ICD-10-CM | POA: Diagnosis not present

## 2022-02-15 DIAGNOSIS — C3490 Malignant neoplasm of unspecified part of unspecified bronchus or lung: Secondary | ICD-10-CM

## 2022-02-15 DIAGNOSIS — Z79811 Long term (current) use of aromatase inhibitors: Secondary | ICD-10-CM | POA: Diagnosis not present

## 2022-02-15 LAB — CBC WITH DIFFERENTIAL/PLATELET
Abs Immature Granulocytes: 0.01 10*3/uL (ref 0.00–0.07)
Basophils Absolute: 0 10*3/uL (ref 0.0–0.1)
Basophils Relative: 1 %
Eosinophils Absolute: 0.1 10*3/uL (ref 0.0–0.5)
Eosinophils Relative: 2 %
HCT: 41.8 % (ref 36.0–46.0)
Hemoglobin: 14.2 g/dL (ref 12.0–15.0)
Immature Granulocytes: 0 %
Lymphocytes Relative: 14 %
Lymphs Abs: 0.8 10*3/uL (ref 0.7–4.0)
MCH: 31.7 pg (ref 26.0–34.0)
MCHC: 34 g/dL (ref 30.0–36.0)
MCV: 93.3 fL (ref 80.0–100.0)
Monocytes Absolute: 0.4 10*3/uL (ref 0.1–1.0)
Monocytes Relative: 7 %
Neutro Abs: 4.2 10*3/uL (ref 1.7–7.7)
Neutrophils Relative %: 76 %
Platelets: 175 10*3/uL (ref 150–400)
RBC: 4.48 MIL/uL (ref 3.87–5.11)
RDW: 12.1 % (ref 11.5–15.5)
WBC: 5.5 10*3/uL (ref 4.0–10.5)
nRBC: 0 % (ref 0.0–0.2)

## 2022-02-15 LAB — MAGNESIUM: Magnesium: 2.3 mg/dL (ref 1.7–2.4)

## 2022-02-15 LAB — COMPREHENSIVE METABOLIC PANEL
ALT: 10 U/L (ref 0–44)
AST: 12 U/L — ABNORMAL LOW (ref 15–41)
Albumin: 4 g/dL (ref 3.5–5.0)
Alkaline Phosphatase: 64 U/L (ref 38–126)
Anion gap: 7 (ref 5–15)
BUN: 18 mg/dL (ref 8–23)
CO2: 28 mmol/L (ref 22–32)
Calcium: 9.8 mg/dL (ref 8.9–10.3)
Chloride: 101 mmol/L (ref 98–111)
Creatinine, Ser: 0.8 mg/dL (ref 0.44–1.00)
GFR, Estimated: >60 mL/min (ref >60)
Glucose, Bld: 113 mg/dL — ABNORMAL HIGH (ref 70–99)
Potassium: 4 mmol/L (ref 3.5–5.1)
Sodium: 136 mmol/L (ref 135–145)
Total Bilirubin: 1.9 mg/dL — ABNORMAL HIGH (ref 0.3–1.2)
Total Protein: 7.5 g/dL (ref 6.5–8.1)

## 2022-02-15 MED ORDER — HEPARIN SOD (PORK) LOCK FLUSH 100 UNIT/ML IV SOLN
500.0000 [IU] | Freq: Once | INTRAVENOUS | Status: AC | PRN
  Administered 2022-02-15: 500 [IU]

## 2022-02-15 MED ORDER — SODIUM CHLORIDE 0.9 % IV SOLN
10.0000 mg/kg | Freq: Once | INTRAVENOUS | Status: AC
  Administered 2022-02-15: 860 mg via INTRAVENOUS
  Filled 2022-02-15: qty 10

## 2022-02-15 MED ORDER — SODIUM CHLORIDE 0.9% FLUSH
10.0000 mL | INTRAVENOUS | Status: DC | PRN
  Administered 2022-02-15: 10 mL

## 2022-02-15 MED ORDER — SODIUM CHLORIDE 0.9 % IV SOLN: Freq: Once | INTRAVENOUS | Status: AC

## 2022-02-15 NOTE — Patient Instructions (Signed)
[facility]  Discharge Instructions: Thank you for choosing [facility] to provide your oncology and hematology care.  If you have a lab appointment with the [facility], please come in thru the Main Entrance and check in at the main information desk.  Wear comfortable clothing and clothing appropriate for easy access to any Portacath or PICC line.   We strive to give you quality time with your provider. You may need to reschedule your appointment if you arrive late (15 or more minutes).  Arriving late affects you and other patients whose appointments are after yours.  Also, if you miss three or more appointments without notifying the office, you may be dismissed from the clinic at the provider's discretion.      For prescription refill requests, have your pharmacy contact our office and allow 72 hours for refills to be completed.    Today you received the following chemotherapy and/or immunotherapy agents imfinzi      To help prevent nausea and vomiting after your treatment, we encourage you to take your nausea medication as directed.  BELOW ARE SYMPTOMS THAT SHOULD BE REPORTED IMMEDIATELY: *FEVER GREATER THAN 100.4 F (38 C) OR HIGHER *CHILLS OR SWEATING *NAUSEA AND VOMITING THAT IS NOT CONTROLLED WITH YOUR NAUSEA MEDICATION *UNUSUAL SHORTNESS OF BREATH *UNUSUAL BRUISING OR BLEEDING *URINARY PROBLEMS (pain or burning when urinating, or frequent urination) *BOWEL PROBLEMS (unusual diarrhea, constipation, pain near the anus) TENDERNESS IN MOUTH AND THROAT WITH OR WITHOUT PRESENCE OF ULCERS (sore throat, sores in mouth, or a toothache) UNUSUAL RASH, SWELLING OR PAIN  UNUSUAL VAGINAL DISCHARGE OR ITCHING   Items with * indicate a potential emergency and should be followed up as soon as possible or go to the Emergency Department if any problems should occur.  Please show the CHEMOTHERAPY ALERT CARD or IMMUNOTHERAPY ALERT CARD at check-in to the Emergency  Department and triage nurse.  Should you have questions after your visit or need to cancel or reschedule your appointment, please contact [facility] [phone]  and follow the prompts.  Office hours are 8:00 a.m. to 4:30 p.m. Monday - Friday. Please note that voicemails left after 4:00 p.m. may not be returned until the following business day.  We are closed weekends and major holidays. You have access to a nurse at all times for urgent questions. Please call the main number to the clinic [phone] and follow the prompts.  For any non-urgent questions, you may also contact your provider using MyChart. We now offer e-Visits for anyone 35 and older to request care online for non-urgent symptoms. For details visit [email or web address].   Also download the MyChart app! Go to the app store, search "MyChart", open the app, select [facility], and log in with your MyChart username and password.  Masks are optional in the cancer centers. If you would like for your care team to wear a mask while they are taking care of you, please let them know. For doctor visits, patients may have with them one support person who is at least 69 years old. At this time, visitors are not allowed in the infusion area.

## 2022-02-15 NOTE — Progress Notes (Signed)
Treatment given per orders. Patient tolerated it well without problems. Vitals stable and discharged home from clinic ambulatory. Follow up as scheduled.  

## 2022-02-19 ENCOUNTER — Other Ambulatory Visit: Payer: Self-pay

## 2022-02-21 ENCOUNTER — Other Ambulatory Visit (HOSPITAL_COMMUNITY): Payer: Self-pay

## 2022-02-21 MED ORDER — HYDROCODONE-ACETAMINOPHEN 7.5-325 MG/15ML PO SOLN
10.0000 mL | Freq: Four times a day (QID) | ORAL | 0 refills | Status: DC | PRN
Start: 2022-02-21 — End: 2022-03-04

## 2022-02-28 DIAGNOSIS — Q310 Web of larynx: Secondary | ICD-10-CM | POA: Diagnosis not present

## 2022-02-28 DIAGNOSIS — R490 Dysphonia: Secondary | ICD-10-CM | POA: Diagnosis not present

## 2022-02-28 DIAGNOSIS — C329 Malignant neoplasm of larynx, unspecified: Secondary | ICD-10-CM | POA: Diagnosis not present

## 2022-02-28 DIAGNOSIS — R070 Pain in throat: Secondary | ICD-10-CM | POA: Diagnosis not present

## 2022-03-01 ENCOUNTER — Inpatient Hospital Stay (HOSPITAL_BASED_OUTPATIENT_CLINIC_OR_DEPARTMENT_OTHER): Payer: Medicare HMO | Admitting: Hematology

## 2022-03-01 ENCOUNTER — Inpatient Hospital Stay: Payer: Medicare HMO

## 2022-03-01 ENCOUNTER — Inpatient Hospital Stay: Payer: Medicare HMO | Attending: Hematology and Oncology

## 2022-03-01 VITALS — BP 99/82 | HR 80 | Temp 97.5°F | Resp 19

## 2022-03-01 DIAGNOSIS — R499 Unspecified voice and resonance disorder: Secondary | ICD-10-CM | POA: Diagnosis not present

## 2022-03-01 DIAGNOSIS — K5900 Constipation, unspecified: Secondary | ICD-10-CM | POA: Diagnosis not present

## 2022-03-01 DIAGNOSIS — Z806 Family history of leukemia: Secondary | ICD-10-CM | POA: Insufficient documentation

## 2022-03-01 DIAGNOSIS — Z87891 Personal history of nicotine dependence: Secondary | ICD-10-CM | POA: Insufficient documentation

## 2022-03-01 DIAGNOSIS — E876 Hypokalemia: Secondary | ICD-10-CM | POA: Insufficient documentation

## 2022-03-01 DIAGNOSIS — C321 Malignant neoplasm of supraglottis: Secondary | ICD-10-CM | POA: Insufficient documentation

## 2022-03-01 DIAGNOSIS — Z8041 Family history of malignant neoplasm of ovary: Secondary | ICD-10-CM | POA: Diagnosis not present

## 2022-03-01 DIAGNOSIS — Z8051 Family history of malignant neoplasm of kidney: Secondary | ICD-10-CM | POA: Insufficient documentation

## 2022-03-01 DIAGNOSIS — R5383 Other fatigue: Secondary | ICD-10-CM | POA: Insufficient documentation

## 2022-03-01 DIAGNOSIS — C3432 Malignant neoplasm of lower lobe, left bronchus or lung: Secondary | ICD-10-CM | POA: Insufficient documentation

## 2022-03-01 DIAGNOSIS — Z833 Family history of diabetes mellitus: Secondary | ICD-10-CM | POA: Insufficient documentation

## 2022-03-01 DIAGNOSIS — E669 Obesity, unspecified: Secondary | ICD-10-CM | POA: Insufficient documentation

## 2022-03-01 DIAGNOSIS — Z809 Family history of malignant neoplasm, unspecified: Secondary | ICD-10-CM | POA: Diagnosis not present

## 2022-03-01 DIAGNOSIS — Z8249 Family history of ischemic heart disease and other diseases of the circulatory system: Secondary | ICD-10-CM | POA: Diagnosis not present

## 2022-03-01 DIAGNOSIS — Z818 Family history of other mental and behavioral disorders: Secondary | ICD-10-CM | POA: Diagnosis not present

## 2022-03-01 DIAGNOSIS — Z923 Personal history of irradiation: Secondary | ICD-10-CM | POA: Insufficient documentation

## 2022-03-01 DIAGNOSIS — C3490 Malignant neoplasm of unspecified part of unspecified bronchus or lung: Secondary | ICD-10-CM

## 2022-03-01 DIAGNOSIS — Z800 Family history of malignant neoplasm of digestive organs: Secondary | ICD-10-CM | POA: Insufficient documentation

## 2022-03-01 DIAGNOSIS — R519 Headache, unspecified: Secondary | ICD-10-CM | POA: Diagnosis not present

## 2022-03-01 DIAGNOSIS — Z79899 Other long term (current) drug therapy: Secondary | ICD-10-CM | POA: Diagnosis not present

## 2022-03-01 DIAGNOSIS — Z5112 Encounter for antineoplastic immunotherapy: Secondary | ICD-10-CM | POA: Diagnosis not present

## 2022-03-01 DIAGNOSIS — Z9221 Personal history of antineoplastic chemotherapy: Secondary | ICD-10-CM | POA: Diagnosis not present

## 2022-03-01 DIAGNOSIS — R079 Chest pain, unspecified: Secondary | ICD-10-CM | POA: Insufficient documentation

## 2022-03-01 DIAGNOSIS — Z170 Estrogen receptor positive status [ER+]: Secondary | ICD-10-CM

## 2022-03-01 LAB — COMPREHENSIVE METABOLIC PANEL
ALT: 17 U/L (ref 0–44)
AST: 20 U/L (ref 15–41)
Albumin: 3.6 g/dL (ref 3.5–5.0)
Alkaline Phosphatase: 67 U/L (ref 38–126)
Anion gap: 6 (ref 5–15)
BUN: 18 mg/dL (ref 8–23)
CO2: 28 mmol/L (ref 22–32)
Calcium: 9.6 mg/dL (ref 8.9–10.3)
Chloride: 103 mmol/L (ref 98–111)
Creatinine, Ser: 0.76 mg/dL (ref 0.44–1.00)
GFR, Estimated: >60 mL/min (ref >60)
Glucose, Bld: 128 mg/dL — ABNORMAL HIGH (ref 70–99)
Potassium: 3.6 mmol/L (ref 3.5–5.1)
Sodium: 137 mmol/L (ref 135–145)
Total Bilirubin: 1.9 mg/dL — ABNORMAL HIGH (ref 0.3–1.2)
Total Protein: 7 g/dL (ref 6.5–8.1)

## 2022-03-01 LAB — CBC WITH DIFFERENTIAL/PLATELET
Abs Immature Granulocytes: 0 10*3/uL (ref 0.00–0.07)
Basophils Absolute: 0 10*3/uL (ref 0.0–0.1)
Basophils Relative: 1 %
Eosinophils Absolute: 0.1 10*3/uL (ref 0.0–0.5)
Eosinophils Relative: 2 %
HCT: 38.2 % (ref 36.0–46.0)
Hemoglobin: 13.1 g/dL (ref 12.0–15.0)
Immature Granulocytes: 0 %
Lymphocytes Relative: 14 %
Lymphs Abs: 0.6 10*3/uL — ABNORMAL LOW (ref 0.7–4.0)
MCH: 31.4 pg (ref 26.0–34.0)
MCHC: 34.3 g/dL (ref 30.0–36.0)
MCV: 91.6 fL (ref 80.0–100.0)
Monocytes Absolute: 0.3 10*3/uL (ref 0.1–1.0)
Monocytes Relative: 8 %
Neutro Abs: 3.2 10*3/uL (ref 1.7–7.7)
Neutrophils Relative %: 75 %
Platelets: 163 10*3/uL (ref 150–400)
RBC: 4.17 MIL/uL (ref 3.87–5.11)
RDW: 12.5 % (ref 11.5–15.5)
WBC: 4.2 10*3/uL (ref 4.0–10.5)
nRBC: 0 % (ref 0.0–0.2)

## 2022-03-01 LAB — MAGNESIUM: Magnesium: 1.9 mg/dL (ref 1.7–2.4)

## 2022-03-01 LAB — TSH: TSH: 1.357 u[IU]/mL (ref 0.350–4.500)

## 2022-03-01 MED ORDER — SODIUM CHLORIDE 0.9% FLUSH
10.0000 mL | INTRAVENOUS | Status: DC | PRN
  Administered 2022-03-01: 10 mL

## 2022-03-01 MED ORDER — SODIUM CHLORIDE 0.9 % IV SOLN: Freq: Once | INTRAVENOUS | Status: AC

## 2022-03-01 MED ORDER — HEPARIN SOD (PORK) LOCK FLUSH 100 UNIT/ML IV SOLN
500.0000 [IU] | Freq: Once | INTRAVENOUS | Status: AC | PRN
  Administered 2022-03-01: 500 [IU]

## 2022-03-01 MED ORDER — SODIUM CHLORIDE 0.9 % IV SOLN
10.0000 mg/kg | Freq: Once | INTRAVENOUS | Status: AC
  Administered 2022-03-01: 860 mg via INTRAVENOUS
  Filled 2022-03-01: qty 10

## 2022-03-01 NOTE — Patient Instructions (Signed)
[facility]  Discharge Instructions: Thank you for choosing [facility] to provide your oncology and hematology care.  If you have a lab appointment with the [facility], please come in thru the Main Entrance and check in at the main information desk.  Wear comfortable clothing and clothing appropriate for easy access to any Portacath or PICC line.   We strive to give you quality time with your provider. You may need to reschedule your appointment if you arrive late (15 or more minutes).  Arriving late affects you and other patients whose appointments are after yours.  Also, if you miss three or more appointments without notifying the office, you may be dismissed from the clinic at the provider's discretion.      For prescription refill requests, have your pharmacy contact our office and allow 72 hours for refills to be completed.    Today you received the following chemotherapy and/or immunotherapy agents imfinzi.        To help prevent nausea and vomiting after your treatment, we encourage you to take your nausea medication as directed.  BELOW ARE SYMPTOMS THAT SHOULD BE REPORTED IMMEDIATELY: *FEVER GREATER THAN 100.4 F (38 C) OR HIGHER *CHILLS OR SWEATING *NAUSEA AND VOMITING THAT IS NOT CONTROLLED WITH YOUR NAUSEA MEDICATION *UNUSUAL SHORTNESS OF BREATH *UNUSUAL BRUISING OR BLEEDING *URINARY PROBLEMS (pain or burning when urinating, or frequent urination) *BOWEL PROBLEMS (unusual diarrhea, constipation, pain near the anus) TENDERNESS IN MOUTH AND THROAT WITH OR WITHOUT PRESENCE OF ULCERS (sore throat, sores in mouth, or a toothache) UNUSUAL RASH, SWELLING OR PAIN  UNUSUAL VAGINAL DISCHARGE OR ITCHING   Items with * indicate a potential emergency and should be followed up as soon as possible or go to the Emergency Department if any problems should occur.  Please show the CHEMOTHERAPY ALERT CARD or IMMUNOTHERAPY ALERT CARD at check-in to the  Emergency Department and triage nurse.  Should you have questions after your visit or need to cancel or reschedule your appointment, please contact [facility] [phone]  and follow the prompts.  Office hours are 8:00 a.m. to 4:30 p.m. Monday - Friday. Please note that voicemails left after 4:00 p.m. may not be returned until the following business day.  We are closed weekends and major holidays. You have access to a nurse at all times for urgent questions. Please call the main number to the clinic [phone] and follow the prompts.  For any non-urgent questions, you may also contact your provider using MyChart. We now offer e-Visits for anyone 2 and older to request care online for non-urgent symptoms. For details visit [email or web address].   Also download the MyChart app! Go to the app store, search "MyChart", open the app, select [facility], and log in with your MyChart username and password.  Masks are optional in the cancer centers. If you would like for your care team to wear a mask while they are taking care of you, please let them know. For doctor visits, patients may have with them one support person who is at least 69 years old. At this time, visitors are not allowed in the infusion area.

## 2022-03-01 NOTE — Progress Notes (Signed)
Total bili 1.9 today and ok to treat verbal order Dr. Delton Coombes.   Patient tolerated chemotherapy with no complaints voiced.  Side effects with management reviewed with understanding verbalized.  Port site clean and dry with no bruising or swelling noted at site.  Good blood return noted before and after administration of chemotherapy.  Band aid applied.  Patient left in satisfactory condition with VSS and no s/s of distress noted.

## 2022-03-01 NOTE — Progress Notes (Signed)
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    06/24/2014 Breast US   Left breast: ill-defined predominantly hypoechoic area with some increased echogenicity in the 2 o'clock position of the left breast, 7 cm from the nipple. 2.6 x 2.3 x 1.8 cm in maximum dimensions.   07/01/2014 Initial Biopsy   Left breast core needle bx: Invasive mammary carcinoma with lobular features, ER+ (100%), PR+ (91%), HER2/neu negative (ratio 1.09), Ki-67 32%. E-cadherin strongly positive, diagnostic for IDC    07/01/2014 Clinical Stage   Stage IIA: T2 N0   08/20/2014 Definitive Surgery   Left breast seed localized lumpectomy/SLNB: IDC, +LVI, DCIS, 1 LN removed and positive for metastatic carcinoma. Grade 2. HER2/neu repeated and negative (ratio 0.69-1.9).    08/20/2014 Pathologic Stage   Stage IIB: pT2 pN1a pMx   09/17/2014 Surgery   Port-a cath placement and re-excision   10/02/2014 - 01/15/2015 Chemotherapy   CMF x 6 cycles.  patient refused Taxane-containing chemotherapy.   03/07/2015 Procedure   Comp Cancer Panel reveals no variant at APC, ATM, AXIN2, BARD1, BMPR1A, BRCA1, BRCA2, BRIP1, CDH1, CDK4, CDKN2A, CHEK2, FANCC, MLH1, MSH2, MSH6, MUTYH, NBN, PALB2, PMS2, POLD1, POLE, PTEN, RAD51C, RAD51D, SMAD4, STK11, TP53, VHL, and XRCC2.    04/06/2015 - 05/21/2015 Radiation Therapy    Adjuvant RT Pablo Ledger): Left breast/ 45 Gy over 25 fractions.   Left supraclavicular fossa and axilla/ 45 Gy over 25 fractions. Left breast boost/ 16 Gy over 8 fractions. Total dose: 61 Gy   06/04/2015 - 08/07/2015 Anti-estrogen oral therapy   Exemestane 25 mg daily.  Planned duration of therapy 5 years.    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  PET also demonstrated borderline right level 2 lymph node left supraclavicular, mediastinal and hilar lymphadenopathy and hypermetabolic left lower lobe pulmonary nodule all suspicious of metastatic focus.  No abdominal pelvic metastatic disease or osseous metastatic disease was appreciated.   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, a 71 y.o. female, returns for toxicity assessment and consolidation durvalumab therapy.  She has received 2 doses of durvalumab.  She reported fatigue which lasted about 3 to 4 days after each treatment.  She is also having problems with constipation.  She is currently taking MiraLAX daily which is not helping much.  She was evaluated by Dr. Redmond Baseman of ENT yesterday.  He did an exam and apparently did not see any sign of cancer.   REVIEW OF SYSTEMS:  Review of Systems  Constitutional:  Negative for appetite change and fatigue.  HENT:   Positive for voice change (hoarse). Negative for sore throat.   Cardiovascular:  Positive for chest pain (reflux).  Musculoskeletal:  Negative for arthralgias.  Neurological:  Positive for headaches.  All other systems reviewed and are negative.   PAST MEDICAL/SURGICAL HISTORY:  Past Medical History:  Diagnosis Date   Arthritis    Asthma    Asymptomatic varicose veins    Breast cancer ([place]) 06/2014   left   Cancer ([place])    Supra Glottis   Chronic airway obstruction (H[facility])    Complication of anesthesia 2016   difficulty waking up, Oxygen dropped low.   COPD (chronic obstructive pulmonary disease) ([facility])    Depression    Dyspnea    GERD (gastroesophageal reflux disease)     Hemorrhage rectum    and anus.   Hypertension    Insomnia    Other symptoms involving digestive system(787.99)    Pain    Hx.pain in joint involving pelvic region and thigh; pain in limb   Palpitations    Panic attacks    Personal history of chemotherapy 2016   Personal history of radiation therapy 2016   Pneumonia    Pre-diabetes    Prediabetes    Sinusitis    Symptomatic menopausal or female climacteric states    Varicose veins of both lower extremities with pain    Past Surgical History:  Procedure Laterality Date   BREAST BIOPSY Left 10/2012   BREAST BIOPSY Left 07/01/2014   malignant   BREAST LUMPECTOMY Left 08/20/2014   BRONCHIAL BIOPSY  08/23/2021   Procedure: BRONCHIAL BIOPSIES;  Surgeon: Collene Gobble, MD;  [facility] ENDOSCOPY;  Service: Pulmonary;;   BRONCHIAL BRUSHINGS  08/23/2021   Procedure: BRONCHIAL BRUSHINGS;  Surgeon: Collene Gobble, MD;  Location: [facility] ENDOSCOPY;  Service: Pulmonary;;   BRONCHIAL NEEDLE  ASPIRATION BIOPSY  08/23/2021   Procedure: BRONCHIAL NEEDLE ASPIRATION BIOPSIES;  Surgeon: Collene Gobble, MD;  Location: [facility] ENDOSCOPY;  Service: Pulmonary;;   BRONCHIAL WASHINGS  08/23/2021   Procedure: BRONCHIAL WASHINGS;  Surgeon: Collene Gobble, MD;  Location: [facility] ENDOSCOPY;  Service: Pulmonary;;   CESAREAN SECTION     CHOLECYSTECTOMY  1985   COLONOSCOPY  2002   Dr. Lindalou Hose: internal hemorrhoids, one small rectal polyp, adenomatous path    COLONOSCOPY N/A 11/23/2015   Procedure: COLONOSCOPY;  Surgeon: Danie Binder, MD;  [facility] ENDO SU[facility];  Service: Endoscopy;  Laterality: N/A;  1100 - moved to 10:45 - office to notify   ESOPHAGOGASTRODUODENOSCOPY N/A 11/23/2015   Procedure: ESOPHAGOGASTRODUODENOSCOPY (EGD);  Surgeon: Danie Binder, MD;  [facility];  Service: Endoscopy;  Laterality: N/A;   IR IMAGING GUIDED PORT INSERTION  11/08/2021   PORT-A-CATH REMOVAL  02/2015   PORTACATH PLACEMENT Right 09/17/2014   Procedure: INSERTION PORT-A-CATH  WITH ULTRASOUND;  Surgeon: Erroll Luna, MD;  Location: [place];  Service: General;  Laterality: Right;  IJ   RADIOACTIVE SEED GUIDED PARTIAL MASTECTOMY WITH AXILLARY SENTINEL LYMPH NODE BIOPSY Left 08/20/2014   Procedure: LEFT BREAST LUMPECTOMY WITH RADIOACTIVE SEED LOCALIZATION AND SENTINEL LYMPH NODE MAPPING;  Surgeon: Erroll Luna, MD;  Location: [place];  Service: General;  Laterality: Left;   RE-EXCISION OF BREAST LUMPECTOMY Left 09/17/2014   Procedure: RE-EXCISION OF BREAST LUMPECTOMY;  Surgeon: Erroll Luna, MD;  Location: [place];  Service: General;  Laterality: Left;   TOTAL HIP ARTHROPLASTY Right 02/22/2021   Procedure: RIGHT TOTAL HIP ARTHROPLASTY ANTERIOR APPROACH;  Surgeon: Marybelle Killings, MD;  Location: [place];  Service: Orthopedics;  Laterality: Right;   TUBAL LIGATION     VIDEO BRONCHOSCOPY WITH ENDOBRONCHIAL ULTRASOUND N/A 08/23/2021   Procedure: VIDEO BRONCHOSCOPY WITH ENDOBRONCHIAL ULTRASOUND;  Surgeon: Collene Gobble, MD;  Location: [place] ENDOSCOPY;  Service: Pulmonary;  Laterality: N/A;   VIDEO BRONCHOSCOPY WITH RADIAL ENDOBRONCHIAL ULTRASOUND  08/23/2021   Procedure: VIDEO BRONCHOSCOPY WITH RADIAL ENDOBRONCHIAL ULTRASOUND;  Surgeon: Collene Gobble, MD;  Location: [facility] ENDOSCOPY;  Service: Pulmonary;;    SOCIAL HISTORY:  Social History   Socioeconomic History   Marital status: Divorced    Spouse name: Not on file   Number of children: 2   Years of education: Not on file   Highest education level: Not on file  Occupational History   Not on file  Tobacco Use   Smoking status: Former    Packs/day: 1.00    Years: 42.00    Total pack years: 42.00    Types: Cigarettes    Quit date: 02/22/2021    Years since quitting: 1.0   Smokeless tobacco: Never  Vaping Use   Vaping Use: Never used  Substance and Sexual Activity   Alcohol use: No    Alcohol/week: 0.0 standard drinks of alcohol   Drug use: No   Sexual activity:  Never    Birth control/protection: None  Other Topics Concern   Not on file  Social History Narrative   Not on file   Social Determinants of Health   Financial Resource Strain: Not on file  Food Insecurity: Not on file  Transportation Needs: Not on file  Physical Activity: Not on file  Stress: Not on file  Social Connections: Unknown (08/05/2021)   Social Connection and Isolation Panel [NHANES]    Frequency of Communication with Friends and Family: More than three times  a week    Frequency of Social Gatherings with Friends and Family: More than three times a week    Attends Religious Services: Not on Advertising copywriter or Organizations: Not on file    Attends Archivist Meetings: Not on file    Marital Status: Not on file  Intimate Partner Violence: Not on file    FAMILY HISTORY:  Family History  Problem Relation Age of Onset   Diabetes Mother    Ovarian cancer Mother 50   Other Father        died in MVA at age 65   Other Sister        mitral valve disorder   Melanoma Sister 74       removed from back of leg   Colon cancer Brother        early 88s, succumbed to disease   Cancer Paternal Aunt        leukemia, bone, or lung cancer   Alzheimer's disease Maternal Grandmother    Heart attack Maternal Grandfather    Heart attack Paternal Grandmother    COPD Paternal Grandfather    Emphysema Paternal Grandfather    Congestive Heart Failure Paternal Aunt    Stomach cancer Paternal Uncle    Kidney cancer Maternal Uncle    Cancer Cousin        dx. teens/dx. 54s   Cancer Cousin    Colon cancer Cousin     CURRENT MEDICATIONS:  Current Outpatient Medications  Medication Sig Dispense Refill   albuterol (PROVENTIL HFA;VENTOLIN HFA) 108 (90 BASE) MCG/ACT inhaler Inhale 2 puffs into the lungs every 6 (six) hours as needed for wheezing or shortness of breath.      ALPRAZolam (XANAX) 0.5 MG tablet Take 0.5 mg by mouth 3 (three) times daily.     ANORO  ELLIPTA 62.5-25 MCG/INH AEPB Inhale 1 puff into the lungs daily.     aspirin EC 325 MG EC tablet Take 1 tablet (325 mg total) by mouth daily with breakfast. Must take at least 4 weeks postop for DVT prophylaxis 30 tablet 0   calcium carbonate (TUMS - DOSED IN MG ELEMENTAL CALCIUM) 500 MG chewable tablet Chew 2 tablets by mouth 3 (three) times daily as needed for indigestion or heartburn.     fluconazole (DIFLUCAN) 100 MG tablet Take 2 tablets today, then 1 tablet daily x 20 more days. 22 tablet 0   furosemide (LASIX) 20 MG tablet Take 20 mg by mouth daily.     gabapentin (NEURONTIN) 300 MG capsule Take 1 capsule by mouth 3 (three) times daily.     hydrochlorothiazide (HYDRODIURIL) 12.5 MG tablet Take 12.5 mg by mouth daily.     HYDROcodone-acetaminophen (HYCET) 7.5-325 mg/15 ml solution Take 10 mLs by mouth every 6 (six) hours as needed for moderate pain. 473 mL 0   ibuprofen (ADVIL) 800 MG tablet Take 800 mg by mouth every 8 (eight) hours as needed for mild pain. May take liquid     lidocaine (XYLOCAINE) 2 % solution Patient: Mix 1part 2% viscous lidocaine, 1part H20. Swallow 44mL of diluted mixture, 15min before meals and at bedtime, up to QID for sore throat. 200 mL 3   lidocaine-prilocaine (EMLA) cream Apply 1 application. topically as needed. (Patient not taking: Reported on 02/01/2022) 30 g 0   Menthol, Topical Analgesic, (BIOFREEZE EX) Apply 1 application topically daily as needed (pain).     nystatin (MYCOSTATIN) 100000 UNIT/ML suspension Take 5 mLs (  500,000 Units total) by mouth 4 (four) times daily. 60 mL 0   ondansetron (ZOFRAN) 8 MG tablet Take 1 tablet (8 mg total) by mouth every 8 (eight) hours as needed for nausea or vomiting. (Patient not taking: Reported on 02/01/2022) 60 tablet 0   pantoprazole (PROTONIX) 40 MG tablet Take 1 tablet by mouth once daily 90 tablet 0   potassium chloride (KLOR-CON) 20 MEQ packet Take 20 mEq by mouth 2 (two) times daily for 15 days. Dissolve in at least 5  ounces cold water or other beverage and take after meals 30 packet 0   prochlorperazine (COMPAZINE) 10 MG tablet Take 1 tablet (10 mg total) by mouth every 6 (six) hours as needed for nausea or vomiting. (Patient not taking: Reported on 02/01/2022) 60 tablet 0   sertraline (ZOLOFT) 100 MG tablet Take 100 mg by mouth daily.     sucralfate (CARAFATE) 1 g tablet Dissolve 1 tablet in 10 mL H20 and swallow 30 min prior to meals and bedtime. 40 tablet 3   Vitamin D, Ergocalciferol, 50000 units CAPS Take 1 capsule by mouth once a week.     No current facility-administered medications for this visit.    ALLERGIES:  No Known Allergies  PHYSICAL EXAM:  Performance status (ECOG): 1 - Symptomatic but completely ambulatory  There were no vitals filed for this visit. Wt Readings from Last 3 Encounters:  02/01/22 186 lb 9.6 oz (84.6 kg)  01/21/22 187 lb (84.8 kg)  01/20/22 184 lb 4.8 oz (83.6 kg)   Physical Exam Vitals reviewed.  Constitutional:      Appearance: Normal appearance. She is obese.  Cardiovascular:     Rate and Rhythm: Normal rate and regular rhythm.     Pulses: Normal pulses.     Heart sounds: Normal heart sounds.  Pulmonary:     Effort: Pulmonary effort is normal.     Breath sounds: Normal breath sounds.  Neurological:     General: No focal deficit present.     Mental Status: She is alert and oriented to person, place, and time.  Psychiatric:        Mood and Affect: Mood normal.        Behavior: Behavior normal.     LABORATORY DATA:  I have reviewed the labs as listed.     Latest Ref Rng & Units 02/15/2022   12:12 PM 02/01/2022    9:55 AM 01/13/2022    2:02 PM  CBC  WBC 4.0 - 10.5 K/uL 5.5  5.2  5.2   Hemoglobin 12.0 - 15.0 g/dL 14.2  12.9  13.7   Hematocrit 36.0 - 46.0 % 41.8  37.4  39.4   Platelets 150 - 400 K/uL 175  182  210       Latest Ref Rng & Units 02/15/2022   12:12 PM 02/01/2022    9:55 AM 01/13/2022    2:02 PM  CMP  Glucose 70 - 99 mg/dL 113  108  107    BUN 8 - 23 mg/dL $Remove'18  16  12   'ckgyUBH$ Creatinine 0.44 - 1.00 mg/dL 0.80  0.82  0.91   Sodium 135 - 145 mmol/L 136  136  139   Potassium 3.5 - 5.1 mmol/L 4.0  2.7  4.4   Chloride 98 - 111 mmol/L 101  102  102   CO2 22 - 32 mmol/L 28  30  33   Calcium 8.9 - 10.3 mg/dL 9.8  9.1  10.3   Total  Protein 6.5 - 8.1 g/dL 7.5  6.7  7.4   Total Bilirubin 0.3 - 1.2 mg/dL 1.9  2.3  1.9   Alkaline Phos 38 - 126 U/L 64  58  65   AST 15 - 41 U/L $Remo'12  13  11   'fvPPO$ ALT 0 - 44 U/L $Remo'10  11  6     'MVchv$ DIAGNOSTIC IMAGING:  I have independently reviewed the scans and discussed with the patient. MR Brain W Wo Contrast  Result Date: 02/04/2022 CLINICAL DATA:  Non-small cell lung cancer (NSCLC), monitor EXAM: MRI HEAD WITHOUT AND WITH CONTRAST TECHNIQUE: Multiplanar, multiecho pulse sequences of the brain and surrounding structures were obtained without and with intravenous contrast. CONTRAST:  8.44mL GADAVIST GADOBUTROL 1 MMOL/ML IV SOLN COMPARISON:  MRI head September 03, 2021. FINDINGS: Brain: No acute infarction, hemorrhage, hydrocephalus, extra-axial collection or mass lesion. Similar moderate scattered T2/FLAIR hyperintensities in the white matter, nonspecific but compatible with chronic microvascular ischemic disease. No pathologic enhancement. Vascular: Major arterial flow voids are maintained at the skull base. Skull and upper cervical spine: Decreased conspicuity of the previously seen left calvarial bone lesion. No definite concerning bone marrow lesions identified. Sinuses/Orbits: Clear sinuses.  No acute orbital findings. Other: No mastoid effusions. IMPRESSION: 1. No evidence of acute intracranial abnormality or cerebral metastatic disease. 2. Decreased conspicuity of the previously seen left calvarial bone lesion without definite osseous metastasis. Electronically Signed   By: Margaretha Sheffield M.D.   On: 02/04/2022 08:52   MM 3D SCREEN BREAST BILATERAL  Result Date: 02/03/2022 CLINICAL DATA:  Screening. EXAM: DIGITAL  SCREENING BILATERAL MAMMOGRAM WITH TOMOSYNTHESIS AND CAD TECHNIQUE: Bilateral screening digital craniocaudal and mediolateral oblique mammograms were obtained. Bilateral screening digital breast tomosynthesis was performed. The images were evaluated with computer-aided detection. COMPARISON:  Previous exam(s). ACR Breast Density Category b: There are scattered areas of fibroglandular density. FINDINGS: There are no findings suspicious for malignancy. IMPRESSION: No mammographic evidence of malignancy. A result letter of this screening mammogram will be mailed directly to the patient. RECOMMENDATION: Screening mammogram in one year. (Code:SM-B-01Y) BI-RADS CATEGORY  1: Negative. Electronically Signed   By: Abelardo Diesel M.D.   On: 02/03/2022 13:37     ASSESSMENT:  Stage III squamous cell cancer: - LLL lung mass FNA (08/23/2021): Malignant cells consistent with squamous cell carcinoma. - Lymph node 4R and 7: Malignant cells consistent with NSCLC. - Chemoradiation therapy with weekly carboplatin and paclitaxel from 11/01/2021, week for chemotherapy on 11/23/2021.  XRT completed on 12/16/2021. - She missed further doses of chemotherapy secondary to thrombocytopenia. - PET scan (01/27/2022): Interval response to therapy with decreasing size of mediastinal lymph nodes.  No new disease.    Social/family history: - Seen today with her Sister Neoma Laming.  Patient lives by herself.  Quit smoking on 02/22/2021.  Smoked 2 packs/day for 40 years. - Mother died of ovarian cancer.  Brother died of colon cancer at age 51.  Sister had melanoma.  Paternal uncles had stomach cancer and lung cancer.  Paternal aunt had thyroid cancer.  Another paternal aunt had pancreatic cancer.  3.  Supraglottic squamous cell carcinoma (T1N1): - Definitive radiation from 08/16/2021 through 09/13/2021  PLAN:  Stage III squamous cell lung cancer: - She has tolerated 2 doses of Imfinzi reasonably well. - She had fatigue which lasted about 3 to 4  days after each treatment. - Reviewed labs today which showed mildly elevated bilirubin of 1.9.  Rest of LFTs are normal.  CBC was normal.  TSH was 1.3. - Proceed with Imfinzi today and in 2 weeks.  RTC 4 weeks for follow-up.  2.  Supraglottic squamous cell carcinoma (T1N1): - PET scan on 01/27/2022 did not show any evidence of disease. - She was evaluated by Dr. Redmond Baseman of ENT yesterday.  Laryngeal exam showed inflammation and prominent webbing of the anterior glottis with no evidence of cancer.  3.  Throat pain and sternal chest pain: - Continue hydrocodone 10 mg liquid every 6 hours as needed.  4.  Hypokalemia: - Continue potassium 20 mEq daily.  Potassium today is normal.  5.  Constipation: - She is taking MiraLAX without help. - Start taking stool softener along with MiraLAX.  If no improvement, will consider lactulose.   Orders placed this encounter:  No orders of the defined types were placed in this encounter.    Derek Jack, MD [place] [phone]

## 2022-03-01 NOTE — Patient Instructions (Addendum)
[place] at [facility] Discharge Instructions   You were seen and examined today by Dr. Delton Coombes.  He reviewed the results of your lab work which are normal stable.   We will proceed with your treatment today.  Combine stool softener and Miralax and take both daily to help treat your constipation.   Return as scheduled.    Thank you for choosing [place] at [facility] to provide your oncology and hematology care.  To afford each patient quality time with our provider, please arrive at least 15 minutes before your scheduled appointment time.   If you have a lab appointment with the [facility] please come in thru the Main Entrance and check in at the main information desk.  You need to re-schedule your appointment should you arrive 10 or more minutes late.  We strive to give you quality time with our providers, and arriving late affects you and other patients whose appointments are after yours.  Also, if you no show three or more times for appointments you may be dismissed from the clinic at the providers discretion.     Again, thank you for choosing [facility].  Our hope is that these requests will decrease the amount of time that you wait before being seen by our physicians.       _____________________________________________________________  Should you have questions after your visit to [facility], please contact our office at [phone] and follow the prompts.  Our office hours are 8:00 a.m. and 4:30 p.m. Monday - Friday.  Please note that voicemails left after 4:00 p.m. may not be returned until the following business day.  We are closed weekends and major holidays.  You do have access to a nurse 24-7, just call the main number to the clinic [phone] and do not press any options, hold on the line and a nurse will answer the phone.    For prescription refill requests, have your pharmacy contact our  office and allow 72 hours.    Due to Covid, you will need to wear a mask upon entering the hospital. If you do not have a mask, a mask will be given to you at the Main Entrance upon arrival. For doctor visits, patients may have 1 support person age 26 or older with them. For treatment visits, patients can not have anyone with them due to social distancing guidelines and our immunocompromised population.

## 2022-03-04 ENCOUNTER — Other Ambulatory Visit: Payer: Self-pay

## 2022-03-04 MED ORDER — HYDROCODONE-ACETAMINOPHEN 7.5-325 MG/15ML PO SOLN: 10.0000 mL | Freq: Four times a day (QID) | ORAL | 0 refills | Status: DC | PRN

## 2022-03-14 ENCOUNTER — Other Ambulatory Visit: Payer: Self-pay | Admitting: *Deleted

## 2022-03-14 ENCOUNTER — Telehealth: Payer: Self-pay | Admitting: *Deleted

## 2022-03-14 MED ORDER — IBUPROFEN 100 MG/5ML PO SUSP: 200.0000 mg | ORAL | 0 refills | Status: DC | PRN

## 2022-03-14 MED ORDER — HYDROCODONE-ACETAMINOPHEN 7.5-325 MG/15ML PO SOLN
10.0000 mL | Freq: Four times a day (QID) | ORAL | 0 refills | Status: DC | PRN
Start: 2022-03-14 — End: 2022-03-21

## 2022-03-14 NOTE — Telephone Encounter (Signed)
Patient contacted our office requesting refill of liquid hydrocodone.  She last got a refill on 8/11 for 437 ml and has used as of 3 days ago.  The largest quantity that she can receive, due to national backorder is 125 ml.  Patient advised to use sparingly and may use ibuprofen liquid in between doses.  Verbalized understanding.

## 2022-03-15 ENCOUNTER — Inpatient Hospital Stay: Payer: Medicare HMO

## 2022-03-15 ENCOUNTER — Other Ambulatory Visit: Payer: Self-pay | Admitting: *Deleted

## 2022-03-15 VITALS — BP 111/82 | HR 99 | Temp 97.8°F | Resp 18 | Wt 179.6 lb

## 2022-03-15 DIAGNOSIS — Z923 Personal history of irradiation: Secondary | ICD-10-CM | POA: Diagnosis not present

## 2022-03-15 DIAGNOSIS — C321 Malignant neoplasm of supraglottis: Secondary | ICD-10-CM | POA: Diagnosis not present

## 2022-03-15 DIAGNOSIS — Z8051 Family history of malignant neoplasm of kidney: Secondary | ICD-10-CM | POA: Diagnosis not present

## 2022-03-15 DIAGNOSIS — Z809 Family history of malignant neoplasm, unspecified: Secondary | ICD-10-CM | POA: Diagnosis not present

## 2022-03-15 DIAGNOSIS — R519 Headache, unspecified: Secondary | ICD-10-CM | POA: Diagnosis not present

## 2022-03-15 DIAGNOSIS — Z800 Family history of malignant neoplasm of digestive organs: Secondary | ICD-10-CM | POA: Diagnosis not present

## 2022-03-15 DIAGNOSIS — Z5112 Encounter for antineoplastic immunotherapy: Secondary | ICD-10-CM | POA: Diagnosis not present

## 2022-03-15 DIAGNOSIS — Z818 Family history of other mental and behavioral disorders: Secondary | ICD-10-CM | POA: Diagnosis not present

## 2022-03-15 DIAGNOSIS — R079 Chest pain, unspecified: Secondary | ICD-10-CM | POA: Diagnosis not present

## 2022-03-15 DIAGNOSIS — Z170 Estrogen receptor positive status [ER+]: Secondary | ICD-10-CM

## 2022-03-15 DIAGNOSIS — Z8041 Family history of malignant neoplasm of ovary: Secondary | ICD-10-CM | POA: Diagnosis not present

## 2022-03-15 DIAGNOSIS — Z9221 Personal history of antineoplastic chemotherapy: Secondary | ICD-10-CM | POA: Diagnosis not present

## 2022-03-15 DIAGNOSIS — E876 Hypokalemia: Secondary | ICD-10-CM | POA: Diagnosis not present

## 2022-03-15 DIAGNOSIS — Z79899 Other long term (current) drug therapy: Secondary | ICD-10-CM | POA: Diagnosis not present

## 2022-03-15 DIAGNOSIS — Z8249 Family history of ischemic heart disease and other diseases of the circulatory system: Secondary | ICD-10-CM | POA: Diagnosis not present

## 2022-03-15 DIAGNOSIS — C3490 Malignant neoplasm of unspecified part of unspecified bronchus or lung: Secondary | ICD-10-CM

## 2022-03-15 DIAGNOSIS — R499 Unspecified voice and resonance disorder: Secondary | ICD-10-CM | POA: Diagnosis not present

## 2022-03-15 DIAGNOSIS — Z806 Family history of leukemia: Secondary | ICD-10-CM | POA: Diagnosis not present

## 2022-03-15 DIAGNOSIS — C3432 Malignant neoplasm of lower lobe, left bronchus or lung: Secondary | ICD-10-CM | POA: Diagnosis not present

## 2022-03-15 DIAGNOSIS — R5383 Other fatigue: Secondary | ICD-10-CM | POA: Diagnosis not present

## 2022-03-15 DIAGNOSIS — E669 Obesity, unspecified: Secondary | ICD-10-CM | POA: Diagnosis not present

## 2022-03-15 DIAGNOSIS — Z833 Family history of diabetes mellitus: Secondary | ICD-10-CM | POA: Diagnosis not present

## 2022-03-15 DIAGNOSIS — K5900 Constipation, unspecified: Secondary | ICD-10-CM | POA: Diagnosis not present

## 2022-03-15 DIAGNOSIS — Z87891 Personal history of nicotine dependence: Secondary | ICD-10-CM | POA: Diagnosis not present

## 2022-03-15 LAB — COMPREHENSIVE METABOLIC PANEL
ALT: 10 U/L (ref 0–44)
AST: 15 U/L (ref 15–41)
Albumin: 3.5 g/dL (ref 3.5–5.0)
Alkaline Phosphatase: 69 U/L (ref 38–126)
Anion gap: 10 (ref 5–15)
BUN: 20 mg/dL (ref 8–23)
CO2: 29 mmol/L (ref 22–32)
Calcium: 9.5 mg/dL (ref 8.9–10.3)
Chloride: 99 mmol/L (ref 98–111)
Creatinine, Ser: 0.93 mg/dL (ref 0.44–1.00)
GFR, Estimated: >60 mL/min (ref >60)
Glucose, Bld: 122 mg/dL — ABNORMAL HIGH (ref 70–99)
Potassium: 2.8 mmol/L — ABNORMAL LOW (ref 3.5–5.1)
Sodium: 138 mmol/L (ref 135–145)
Total Bilirubin: 1.9 mg/dL — ABNORMAL HIGH (ref 0.3–1.2)
Total Protein: 7.1 g/dL (ref 6.5–8.1)

## 2022-03-15 LAB — CBC WITH DIFFERENTIAL/PLATELET
Abs Immature Granulocytes: 0.02 10*3/uL (ref 0.00–0.07)
Basophils Absolute: 0 10*3/uL (ref 0.0–0.1)
Basophils Relative: 1 %
Eosinophils Absolute: 0.2 10*3/uL (ref 0.0–0.5)
Eosinophils Relative: 3 %
HCT: 38.6 % (ref 36.0–46.0)
Hemoglobin: 13.6 g/dL (ref 12.0–15.0)
Immature Granulocytes: 0 %
Lymphocytes Relative: 10 %
Lymphs Abs: 0.6 10*3/uL — ABNORMAL LOW (ref 0.7–4.0)
MCH: 31.3 pg (ref 26.0–34.0)
MCHC: 35.2 g/dL (ref 30.0–36.0)
MCV: 88.9 fL (ref 80.0–100.0)
Monocytes Absolute: 0.5 10*3/uL (ref 0.1–1.0)
Monocytes Relative: 8 %
Neutro Abs: 4.1 10*3/uL (ref 1.7–7.7)
Neutrophils Relative %: 78 %
Platelets: 170 10*3/uL (ref 150–400)
RBC: 4.34 MIL/uL (ref 3.87–5.11)
RDW: 13.3 % (ref 11.5–15.5)
WBC: 5.4 10*3/uL (ref 4.0–10.5)
nRBC: 0 % (ref 0.0–0.2)

## 2022-03-15 LAB — MAGNESIUM: Magnesium: 1.9 mg/dL (ref 1.7–2.4)

## 2022-03-15 MED ORDER — SODIUM CHLORIDE 0.9 % IV SOLN
10.0000 mg/kg | Freq: Once | INTRAVENOUS | Status: AC
  Administered 2022-03-15: 860 mg via INTRAVENOUS
  Filled 2022-03-15: qty 7.2

## 2022-03-15 MED ORDER — HEPARIN SOD (PORK) LOCK FLUSH 100 UNIT/ML IV SOLN
500.0000 [IU] | Freq: Once | INTRAVENOUS | Status: AC | PRN
  Administered 2022-03-15: 500 [IU]

## 2022-03-15 MED ORDER — POTASSIUM CHLORIDE 20 MEQ PO PACK: 20.0000 meq | PACK | Freq: Two times a day (BID) | ORAL | 0 refills | Status: DC

## 2022-03-15 MED ORDER — SODIUM CHLORIDE 0.9 % IV SOLN: Freq: Once | INTRAVENOUS | Status: AC

## 2022-03-15 MED ORDER — SODIUM CHLORIDE 0.9% FLUSH
10.0000 mL | INTRAVENOUS | Status: DC | PRN
  Administered 2022-03-15: 10 mL

## 2022-03-15 MED ORDER — POTASSIUM CHLORIDE 40 MEQ/15ML (20%) PO SOLN: 20.0000 meq | Freq: Two times a day (BID) | ORAL | 0 refills | Status: DC

## 2022-03-15 NOTE — Progress Notes (Signed)
Potassium 2.9 and bilirubin 1.9 today.  History of constipation and diarrhea over the past three days after the use of Miralax.  Nausea only.  Oncologist aware.   Ok to treat verbal order Dr. Delton Coombes.    Patient tolerated therapy with no complaints voiced.  Side effects with management reviewed with understanding verbalized.  Port site clean and dry with no bruising or swelling noted at site.  Good blood return noted before and after administration of therapy.  Band aid applied.  Patient left in satisfactory condition with VSS and no s/s of distress noted.

## 2022-03-15 NOTE — Patient Instructions (Signed)
[facility]  Discharge Instructions: Thank you for choosing [facility] to provide your oncology and hematology care.  If you have a lab appointment with the [facility], please come in thru the Main Entrance and check in at the main information desk.  Wear comfortable clothing and clothing appropriate for easy access to any Portacath or PICC line.   We strive to give you quality time with your provider. You may need to reschedule your appointment if you arrive late (15 or more minutes).  Arriving late affects you and other patients whose appointments are after yours.  Also, if you miss three or more appointments without notifying the office, you may be dismissed from the clinic at the provider's discretion.      For prescription refill requests, have your pharmacy contact our office and allow 72 hours for refills to be compl   To help prevent nausea and vomiting after your treatment, we encourage you to take your nausea medication as directed.  BELOW ARE SYMPTOMS THAT SHOULD BE REPORTED IMMEDIATELY: *FEVER GREATER THAN 100.4 F (38 C) OR HIGHER *CHILLS OR SWEATING *NAUSEA AND VOMITING THAT IS NOT CONTROLLED WITH YOUR NAUSEA MEDICATION *UNUSUAL SHORTNESS OF BREATH *UNUSUAL BRUISING OR BLEEDING *URINARY PROBLEMS (pain or burning when urinating, or frequent urination) *BOWEL PROBLEMS (unusual diarrhea, constipation, pain near the anus) TENDERNESS IN MOUTH AND THROAT WITH OR WITHOUT PRESENCE OF ULCERS (sore throat, sores in mouth, or a toothache) UNUSUAL RASH, SWELLING OR PAIN  UNUSUAL VAGINAL DISCHARGE OR ITCHING   Items with * indicate a potential emergency and should be followed up as soon as possible or go to the Emergency Department if any problems should occur.  Please show the CHEMOTHERAPY ALERT CARD or IMMUNOTHERAPY ALERT CARD at check-in to the Emergency Department and triage nurse.  Should you have questions after your visit or need to cancel  or reschedule your appointment, please contact [facility] [phone]  and follow the prompts.  Office hours are 8:00 a.m. to 4:30 p.m. Monday - Friday. Please note that voicemails left after 4:00 p.m. may not be returned until the following business day.  We are closed weekends and major holidays. You have access to a nurse at all times for urgent questions. Please call the main number to the clinic [phone] and follow the prompts.  For any non-urgent questions, you may also contact your provider using MyChart. We now offer e-Visits for anyone 10 and older to request care online for non-urgent symptoms. For details visit [email or web address].   Also download the MyChart app! Go to the app store, search "MyChart", open the app, select [facility], and log in with your MyChart username and password.  Masks are optional in the cancer centers. If you would like for your care team to wear a mask while they are taking care of you, please let them know. You may have one support person who is at least 69 years old accompany you for your appointments.

## 2022-03-21 ENCOUNTER — Other Ambulatory Visit: Payer: Self-pay | Admitting: *Deleted

## 2022-03-21 MED ORDER — HYDROCODONE-ACETAMINOPHEN 7.5-325 MG/15ML PO SOLN: 10.0000 mL | Freq: Four times a day (QID) | ORAL | 0 refills | Status: DC | PRN

## 2022-03-23 ENCOUNTER — Other Ambulatory Visit: Payer: Self-pay | Admitting: *Deleted

## 2022-03-23 ENCOUNTER — Encounter: Payer: Self-pay | Admitting: Hematology and Oncology

## 2022-03-23 ENCOUNTER — Other Ambulatory Visit (HOSPITAL_COMMUNITY): Payer: Self-pay

## 2022-03-23 ENCOUNTER — Encounter: Payer: Self-pay | Admitting: Hematology

## 2022-03-23 MED ORDER — HYDROCODONE-ACETAMINOPHEN 7.5-325 MG/15ML PO SOLN
10.0000 mL | Freq: Four times a day (QID) | ORAL | 0 refills | Status: DC | PRN
  Filled 2022-03-23: qty 473, 12d supply, fill #0

## 2022-03-25 NOTE — Progress Notes (Signed)
Patient's sister Hilda Blades called stating that patient is having shortness of breath, feeling of throat closing and neck swelling.   Called patient's sister and made her aware that with those symptoms that patient needs to go to the emergency department. Sister states understanding and is agreeable with plan.

## 2022-03-28 ENCOUNTER — Other Ambulatory Visit: Payer: Self-pay | Admitting: Hematology

## 2022-03-28 DIAGNOSIS — C3490 Malignant neoplasm of unspecified part of unspecified bronchus or lung: Secondary | ICD-10-CM

## 2022-03-29 ENCOUNTER — Inpatient Hospital Stay: Payer: Medicare HMO

## 2022-03-29 ENCOUNTER — Inpatient Hospital Stay: Payer: Medicare HMO | Attending: Hematology and Oncology | Admitting: Hematology

## 2022-03-29 ENCOUNTER — Telehealth: Payer: Self-pay | Admitting: *Deleted

## 2022-03-29 DIAGNOSIS — R499 Unspecified voice and resonance disorder: Secondary | ICD-10-CM | POA: Insufficient documentation

## 2022-03-29 DIAGNOSIS — C321 Malignant neoplasm of supraglottis: Secondary | ICD-10-CM | POA: Diagnosis not present

## 2022-03-29 DIAGNOSIS — Z95828 Presence of other vascular implants and grafts: Secondary | ICD-10-CM

## 2022-03-29 DIAGNOSIS — K5900 Constipation, unspecified: Secondary | ICD-10-CM | POA: Insufficient documentation

## 2022-03-29 DIAGNOSIS — Z5112 Encounter for antineoplastic immunotherapy: Secondary | ICD-10-CM | POA: Insufficient documentation

## 2022-03-29 DIAGNOSIS — Z79811 Long term (current) use of aromatase inhibitors: Secondary | ICD-10-CM | POA: Insufficient documentation

## 2022-03-29 DIAGNOSIS — T451X5A Adverse effect of antineoplastic and immunosuppressive drugs, initial encounter: Secondary | ICD-10-CM | POA: Insufficient documentation

## 2022-03-29 DIAGNOSIS — Z9049 Acquired absence of other specified parts of digestive tract: Secondary | ICD-10-CM | POA: Diagnosis not present

## 2022-03-29 DIAGNOSIS — J029 Acute pharyngitis, unspecified: Secondary | ICD-10-CM | POA: Insufficient documentation

## 2022-03-29 DIAGNOSIS — Z836 Family history of other diseases of the respiratory system: Secondary | ICD-10-CM | POA: Insufficient documentation

## 2022-03-29 DIAGNOSIS — R519 Headache, unspecified: Secondary | ICD-10-CM | POA: Insufficient documentation

## 2022-03-29 DIAGNOSIS — C50412 Malignant neoplasm of upper-outer quadrant of left female breast: Secondary | ICD-10-CM | POA: Insufficient documentation

## 2022-03-29 DIAGNOSIS — Z87891 Personal history of nicotine dependence: Secondary | ICD-10-CM | POA: Diagnosis not present

## 2022-03-29 DIAGNOSIS — Z808 Family history of malignant neoplasm of other organs or systems: Secondary | ICD-10-CM | POA: Diagnosis not present

## 2022-03-29 DIAGNOSIS — Z170 Estrogen receptor positive status [ER+]: Secondary | ICD-10-CM

## 2022-03-29 DIAGNOSIS — Z79899 Other long term (current) drug therapy: Secondary | ICD-10-CM | POA: Diagnosis not present

## 2022-03-29 DIAGNOSIS — Z8041 Family history of malignant neoplasm of ovary: Secondary | ICD-10-CM | POA: Diagnosis not present

## 2022-03-29 DIAGNOSIS — E876 Hypokalemia: Secondary | ICD-10-CM | POA: Insufficient documentation

## 2022-03-29 DIAGNOSIS — Z8719 Personal history of other diseases of the digestive system: Secondary | ICD-10-CM | POA: Diagnosis not present

## 2022-03-29 DIAGNOSIS — Z923 Personal history of irradiation: Secondary | ICD-10-CM | POA: Diagnosis not present

## 2022-03-29 DIAGNOSIS — R490 Dysphonia: Secondary | ICD-10-CM | POA: Insufficient documentation

## 2022-03-29 DIAGNOSIS — C3432 Malignant neoplasm of lower lobe, left bronchus or lung: Secondary | ICD-10-CM | POA: Diagnosis not present

## 2022-03-29 DIAGNOSIS — Z8051 Family history of malignant neoplasm of kidney: Secondary | ICD-10-CM | POA: Insufficient documentation

## 2022-03-29 DIAGNOSIS — C3490 Malignant neoplasm of unspecified part of unspecified bronchus or lung: Secondary | ICD-10-CM

## 2022-03-29 DIAGNOSIS — Z833 Family history of diabetes mellitus: Secondary | ICD-10-CM | POA: Insufficient documentation

## 2022-03-29 DIAGNOSIS — R197 Diarrhea, unspecified: Secondary | ICD-10-CM | POA: Insufficient documentation

## 2022-03-29 DIAGNOSIS — R070 Pain in throat: Secondary | ICD-10-CM | POA: Diagnosis not present

## 2022-03-29 DIAGNOSIS — D6959 Other secondary thrombocytopenia: Secondary | ICD-10-CM | POA: Diagnosis not present

## 2022-03-29 DIAGNOSIS — Z800 Family history of malignant neoplasm of digestive organs: Secondary | ICD-10-CM | POA: Insufficient documentation

## 2022-03-29 DIAGNOSIS — Z818 Family history of other mental and behavioral disorders: Secondary | ICD-10-CM | POA: Insufficient documentation

## 2022-03-29 DIAGNOSIS — Z8249 Family history of ischemic heart disease and other diseases of the circulatory system: Secondary | ICD-10-CM | POA: Insufficient documentation

## 2022-03-29 DIAGNOSIS — Z801 Family history of malignant neoplasm of trachea, bronchus and lung: Secondary | ICD-10-CM | POA: Insufficient documentation

## 2022-03-29 DIAGNOSIS — J449 Chronic obstructive pulmonary disease, unspecified: Secondary | ICD-10-CM | POA: Insufficient documentation

## 2022-03-29 LAB — MAGNESIUM: Magnesium: 2.1 mg/dL (ref 1.7–2.4)

## 2022-03-29 LAB — COMPREHENSIVE METABOLIC PANEL
ALT: 9 U/L (ref 0–44)
AST: 13 U/L — ABNORMAL LOW (ref 15–41)
Albumin: 3.3 g/dL — ABNORMAL LOW (ref 3.5–5.0)
Alkaline Phosphatase: 67 U/L (ref 38–126)
Anion gap: 8 (ref 5–15)
BUN: 14 mg/dL (ref 8–23)
CO2: 33 mmol/L — ABNORMAL HIGH (ref 22–32)
Calcium: 9.2 mg/dL (ref 8.9–10.3)
Chloride: 97 mmol/L — ABNORMAL LOW (ref 98–111)
Creatinine, Ser: 0.88 mg/dL (ref 0.44–1.00)
GFR, Estimated: >60 mL/min (ref >60)
Glucose, Bld: 134 mg/dL — ABNORMAL HIGH (ref 70–99)
Potassium: 2.5 mmol/L — CL (ref 3.5–5.1)
Sodium: 138 mmol/L (ref 135–145)
Total Bilirubin: 1.5 mg/dL — ABNORMAL HIGH (ref 0.3–1.2)
Total Protein: 6.8 g/dL (ref 6.5–8.1)

## 2022-03-29 LAB — CBC WITH DIFFERENTIAL/PLATELET
Abs Immature Granulocytes: 0 10*3/uL (ref 0.00–0.07)
Basophils Absolute: 0 10*3/uL (ref 0.0–0.1)
Basophils Relative: 0 %
Eosinophils Absolute: 0.1 10*3/uL (ref 0.0–0.5)
Eosinophils Relative: 2 %
HCT: 36.4 % (ref 36.0–46.0)
Hemoglobin: 12.5 g/dL (ref 12.0–15.0)
Immature Granulocytes: 0 %
Lymphocytes Relative: 10 %
Lymphs Abs: 0.5 10*3/uL — ABNORMAL LOW (ref 0.7–4.0)
MCH: 30.8 pg (ref 26.0–34.0)
MCHC: 34.3 g/dL (ref 30.0–36.0)
MCV: 89.7 fL (ref 80.0–100.0)
Monocytes Absolute: 0.4 10*3/uL (ref 0.1–1.0)
Monocytes Relative: 8 %
Neutro Abs: 4.6 10*3/uL (ref 1.7–7.7)
Neutrophils Relative %: 80 %
Platelets: 181 10*3/uL (ref 150–400)
RBC: 4.06 MIL/uL (ref 3.87–5.11)
RDW: 13.9 % (ref 11.5–15.5)
WBC: 5.7 10*3/uL (ref 4.0–10.5)
nRBC: 0 % (ref 0.0–0.2)

## 2022-03-29 LAB — TSH: TSH: 3.849 u[IU]/mL (ref 0.350–4.500)

## 2022-03-29 MED ORDER — SODIUM CHLORIDE 0.9% FLUSH
10.0000 mL | INTRAVENOUS | Status: DC | PRN
  Administered 2022-03-29: 10 mL via INTRAVENOUS

## 2022-03-29 MED ORDER — HEPARIN SOD (PORK) LOCK FLUSH 100 UNIT/ML IV SOLN: 500.0000 [IU] | Freq: Once | INTRAVENOUS | Status: DC

## 2022-03-29 MED ORDER — HEPARIN SOD (PORK) LOCK FLUSH 100 UNIT/ML IV SOLN
500.0000 [IU] | Freq: Once | INTRAVENOUS | Status: AC
  Administered 2022-03-29: 500 [IU] via INTRAVENOUS

## 2022-03-29 MED ORDER — POTASSIUM CHLORIDE IN NACL 20-0.9 MEQ/L-% IV SOLN
Freq: Once | INTRAVENOUS | Status: AC
  Filled 2022-03-29: qty 1000

## 2022-03-29 MED ORDER — SODIUM CHLORIDE 0.9% FLUSH: 10.0000 mL | Freq: Once | INTRAVENOUS | Status: DC

## 2022-03-29 MED ORDER — POTASSIUM CHLORIDE CRYS ER 20 MEQ PO TBCR
40.0000 meq | EXTENDED_RELEASE_TABLET | Freq: Once | ORAL | Status: AC
  Administered 2022-03-29: 40 meq via ORAL
  Filled 2022-03-29: qty 2

## 2022-03-29 NOTE — Telephone Encounter (Signed)
Sister Hilda Blades called to advise that patient does not wish to have treatment today due to ill side effects and it being her birthday.  Kept her on the schedule to discuss with DR. Delton Coombes and possible fluids if needed.  She has agreed with this and team aware.

## 2022-03-29 NOTE — Patient Instructions (Addendum)
[facility] at [facility] Discharge Instructions   HAPPY BIRTHDAY!!!!!  You were seen and examined today by Dr. Delton Coombes.  He reviewed the results of your lab work. Your potassium is very low at 2.5. We will give you IV potassium in the clinic today.   We will hold your treatment today.   Please use the viscous lidocaine as prescribed prior to taking potassium so it will not burn your throat. It is important that you take the potassium as prescribed.   We will repeat a CT scan in about 1 month.   Return as scheduled.     Thank you for choosing [facility] at [facility] to provide your oncology and hematology care.  To afford each patient quality time with our provider, please arrive at least 15 minutes before your scheduled appointment time.   If you have a lab appointment with the [facility] please come in thru the Main Entrance and check in at the main information desk.  You need to re-schedule your appointment should you arrive 10 or more minutes late.  We strive to give you quality time with our providers, and arriving late affects you and other patients whose appointments are after yours.  Also, if you no show three or more times for appointments you may be dismissed from the clinic at the providers discretion.     Again, thank you for choosing [facility].  Our hope is that these requests will decrease the amount of time that you wait before being seen by our physicians.       _____________________________________________________________  Should you have questions after your visit to [facility], please contact our office at [phone] and follow the prompts.  Our office hours are 8:00 a.m. and 4:30 p.m. Monday - Friday.  Please note that voicemails left after 4:00 p.m. may not be returned until the following business day.  We are closed weekends and major holidays.  You do have access to a nurse 24-7,  just call the main number to the clinic [phone] and do not press any options, hold on the line and a nurse will answer the phone.    For prescription refill requests, have your pharmacy contact our office and allow 72 hours.    Due to Covid, you will need to wear a mask upon entering the hospital. If you do not have a mask, a mask will be given to you at the Main Entrance upon arrival. For doctor visits, patients may have 1 support person age 85 or older with them. For treatment visits, patients can not have anyone with them due to social distancing guidelines and our immunocompromised population.

## 2022-03-29 NOTE — Patient Instructions (Signed)
[facility]  Discharge Instructions: Thank you for choosing [facility] to provide your oncology and hematology care.  If you have a lab appointment with the Cancer Center, please come in thru the Main Entrance and check in at the main information desk.  Wear comfortable clothing and clothing appropriate for easy access to any Portacath or PICC line.   We strive to give you quality time with your provider. You may need to reschedule your appointment if you arrive late (15 or more minutes).  Arriving late affects you and other patients whose appointments are after yours.  Also, if you miss three or more appointments without notifying the office, you may be dismissed from the clinic at the provider's discretion.      For prescription refill requests, have your pharmacy contact our office and allow 72 hours for refills to be completed.     To help prevent nausea and vomiting after your treatment, we encourage you to take your nausea medication as directed.  BELOW ARE SYMPTOMS THAT SHOULD BE REPORTED IMMEDIATELY: *FEVER GREATER THAN 100.4 F (38 C) OR HIGHER *CHILLS OR SWEATING *NAUSEA AND VOMITING THAT IS NOT CONTROLLED WITH YOUR NAUSEA MEDICATION *UNUSUAL SHORTNESS OF BREATH *UNUSUAL BRUISING OR BLEEDING *URINARY PROBLEMS (pain or burning when urinating, or frequent urination) *BOWEL PROBLEMS (unusual diarrhea, constipation, pain near the anus) TENDERNESS IN MOUTH AND THROAT WITH OR WITHOUT PRESENCE OF ULCERS (sore throat, sores in mouth, or a toothache) UNUSUAL RASH, SWELLING OR PAIN  UNUSUAL VAGINAL DISCHARGE OR ITCHING   Items with * indicate a potential emergency and should be followed up as soon as possible or go to the Emergency Department if any problems should occur.  Please show the CHEMOTHERAPY ALERT CARD or IMMUNOTHERAPY ALERT CARD at check-in to the Emergency Department and triage nurse.  Should you have questions after your visit or need to  cancel or reschedule your appointment, please contact [facility] [phone]  and follow the prompts.  Office hours are 8:00 a.m. to 4:30 p.m. Monday - Friday. Please note that voicemails left after 4:00 p.m. may not be returned until the following business day.  We are closed weekends and major holidays. You have access to a nurse at all times for urgent questions. Please call the main number to the clinic [phone] and follow the prompts.  For any non-urgent questions, you may also contact your provider using MyChart. We now offer e-Visits for anyone 18 and older to request care online for non-urgent symptoms. For details visit [email or web address].   Also download the MyChart app! Go to the app store, search "MyChart", open the app, select [facility], and log in with your MyChart username and password.  Masks are optional in the cancer centers. If you would like for your care team to wear a mask while they are taking care of you, please let them know. You may have one support person who is at least 69 years old accompany you for your appointments.  

## 2022-03-29 NOTE — Progress Notes (Signed)
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    06/24/2014 Breast US   Left breast: ill-defined predominantly hypoechoic area with some increased echogenicity in the 2 o'clock position of the left breast, 7 cm from the nipple. 2.6 x 2.3 x 1.8 cm in maximum dimensions.   07/01/2014 Initial Biopsy   Left breast core needle bx: Invasive mammary carcinoma with lobular features, ER+ (100%), PR+ (91%), HER2/neu negative (ratio 1.09), Ki-67 32%. E-cadherin strongly positive, diagnostic for IDC    07/01/2014 Clinical Stage   Stage IIA: T2 N0   08/20/2014 Definitive Surgery   Left breast seed localized lumpectomy/SLNB: IDC, +LVI, DCIS, 1 LN removed and positive for metastatic carcinoma. Grade 2. HER2/neu repeated and negative (ratio 0.69-1.9).    08/20/2014 Pathologic Stage   Stage IIB: pT2 pN1a pMx   09/17/2014 Surgery   Port-a cath placement and re-excision   10/02/2014 - 01/15/2015 Chemotherapy   CMF x 6 cycles.  patient refused Taxane-containing chemotherapy.   03/07/2015 Procedure   Comp Cancer Panel reveals no variant at APC, ATM, AXIN2, BARD1, BMPR1A, BRCA1, BRCA2, BRIP1, CDH1, CDK4, CDKN2A, CHEK2, FANCC, MLH1, MSH2, MSH6, MUTYH, NBN, PALB2, PMS2, POLD1, POLE, PTEN, RAD51C, RAD51D, SMAD4, STK11, TP53, VHL, and XRCC2.    04/06/2015 - 05/21/2015 Radiation Therapy    Adjuvant RT Regina Mcmahon): Left breast/ 45 Gy over 25 fractions.   Left supraclavicular fossa and axilla/ 45 Gy over 25 fractions. Left breast boost/ 16 Gy over 8 fractions. Total dose: 61 Gy   06/04/2015 - 08/07/2015 Anti-estrogen oral therapy   Exemestane 25 mg daily.  Planned duration of therapy 5 years.    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  PET also demonstrated borderline right level 2 lymph node left supraclavicular, mediastinal and hilar lymphadenopathy and hypermetabolic left lower lobe pulmonary nodule all suspicious of metastatic focus.  No abdominal pelvic metastatic disease or osseous metastatic disease was appreciated.   08/31/2021 Initial Diagnosis   Squamous cell carcinoma of lung ([facility])   09/08/2021 Cancer Staging   Staging form: Lung, AJCC 8th Edition -  Clinical stage from 09/08/2021: Stage IIIB (cT1b, cN3, cM0) - Signed by Regina Gibson, MD on 09/08/2021 Stage prefix: Initial diagnosis   09/30/2021 Pathology Results   [facility] 360 Pathology PDL 1 29%   11/01/2021 - 11/23/2021 Chemotherapy   Patient is on Treatment Plan : LUNG Carboplatin / Paclitaxel + XRT q7d     02/01/2022 - 03/15/2022 Chemotherapy   Patient  is on Treatment Plan : LUNG Durvalumab q14d     02/01/2022 -  Chemotherapy   Patient is on Treatment Plan : LUNG Durvalumab (10) q14d       CANCER STAGING:  Cancer Staging  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 female, returns for follow-up along with her sister.  Reports that her throat pain has gotten worse in the last few weeks.  Reports that hoarseness is also gotten worse.  She reported some diarrhea on the day of treatment.  Otherwise she has constipation.  She had difficulty getting pain medication and was off of pain medication and was taking decreased doses in the last few weeks.  Did not take potassium supplements for the last 5 to 6 days.  REVIEW OF SYSTEMS:  Review of Systems  Constitutional:  Negative for appetite change and fatigue.  HENT:   Positive for sore throat and voice change (hoarse).   Cardiovascular:  Negative for chest pain.  Gastrointestinal:  Positive for diarrhea (Only on the day of treatment).  Musculoskeletal:  Negative for arthralgias.  Neurological:  Positive for headaches.  All other systems reviewed and are negative.   PAST MEDICAL/SURGICAL HISTORY:  Past Medical History:  Diagnosis Date   Arthritis    Asthma    Asymptomatic varicose veins    Breast cancer ([facility]) 06/2014   left   Cancer ([facility])    Supra Glottis   Chronic airway obstruction ([facility])    Complication of anesthesia  2016   difficulty waking up, Oxygen dropped low.   COPD (chronic obstructive pulmonary disease) ([facility])    Depression    Dyspnea    GERD (gastroesophageal reflux disease)    Hemorrhage rectum    and anus.   Hypertension    Insomnia    Other symptoms involving digestive system(787.99)    Pain    Hx.pain in joint involving pelvic region and thigh; pain in limb   Palpitations    Panic attacks    Personal history of chemotherapy 2016   Personal history of radiation therapy 2016   Pneumonia    Pre-diabetes    Prediabetes    Sinusitis    Symptomatic menopausal or female climacteric states    Varicose veins of both lower extremities with pain    Past Surgical History:  Procedure Laterality Date   BREAST BIOPSY Left 10/2012   BREAST BIOPSY Left 07/01/2014   malignant   BREAST LUMPECTOMY Left 08/20/2014   BRONCHIAL BIOPSY  08/23/2021   Procedure: BRONCHIAL BIOPSIES;  Surgeon: Collene Gobble, MD;  Location: [facility] ENDOSCOPY;  Service: Pulmonary;;   BRONCHIAL BRUSHINGS  08/23/2021   Procedure: BRONCHIAL BRUSHINGS;  Surgeon: Collene Gobble, MD;  Location[facility] ENDOSCOPY;  Service: Pulmonary;;   BRONCHIAL NEEDLE ASPIRATION BIOPSY  08/23/2021   Procedure: BRONCHIAL NEEDLE ASPIRATION BIOPSIES;  Surgeon: Collene Gobble, MD;  Location: [facility] ENDOSCOPY;  Service: Pulmonary;;   BRONCHIAL WASHINGS  08/23/2021   Procedure: BRONCHIAL WASHINGS;  Surgeon: Collene Gobble, MD;  Location: [facility] ENDOSCOPY;  Service: Pulmonary;;   CESAREAN SECTION     CHOLECYSTECTOMY  1985   COLONOSCOPY  2002   Dr. Lindalou Hose: internal hemorrhoids, one small rectal polyp, adenomatous path    COLONOSCOPY N/A 11/23/2015   Procedure: COLONOSCOPY;  Surgeon: Danie Binder, MD;  Location: [facility] ENDO SU[facility];  Service: Endoscopy;  Laterality: N/A;  1100 - moved to 10:45 - office to notify   ESOPHAGOGASTRODUODENOSCOPY N/A 11/23/2015   Procedure: ESOPHAGOGASTRODUODENOSCOPY (EGD);  Surgeon: Danie Binder, MD;  Location: [facility];  Service:  Endoscopy;  Laterality: N/A;   IR IMAGING GUIDED PORT INSERTION  11/08/2021   PORT-A-CATH REMOVAL  02/2015   PORTACATH PLACEMENT Right 09/17/2014   Procedure: INSERTION PORT-A-CATH WITH ULTRASOUND;  Surgeon: Erroll Luna, MD;  Location: [facility];  Service: General;  Laterality: Right;  IJ   RADIOACTIVE SEED GUIDED PARTIAL MASTECTOMY WITH AXILLARY SENTINEL LYMPH NODE BIOPSY Left 08/20/2014   Procedure: LEFT BREAST LUMPECTOMY WITH RADIOACTIVE SEED LOCALIZATION AND SENTINEL LYMPH NODE MAPPING;  Surgeon: Erroll Luna, MD;  Location: [facility];  Service: General;  Laterality: Left;   RE-EXCISION OF BREAST LUMPECTOMY Left 09/17/2014   Procedure: RE-EXCISION OF BREAST LUMPECTOMY;  Surgeon: Erroll Luna, MD;  Location: [facility];  Service: General;  Laterality: Left;   TOTAL HIP ARTHROPLASTY Right 02/22/2021   Procedure: RIGHT TOTAL HIP ARTHROPLASTY ANTERIOR APPROACH;  Surgeon: Marybelle Killings, MD;  Location: [facility];  Service: Orthopedics;  Laterality: Right;   TUBAL LIGATION     VIDEO BRONCHOSCOPY WITH ENDOBRONCHIAL ULTRASOUND N/A 08/23/2021   Procedure: VIDEO BRONCHOSCOPY WITH ENDOBRONCHIAL ULTRASOUND;  Surgeon: Collene Gobble, MD;  Location: [facility] ENDOSCOPY;  Service: Pulmonary;  Laterality: N/A;   VIDEO BRONCHOSCOPY WITH RADIAL ENDOBRONCHIAL ULTRASOUND  08/23/2021   Procedure: VIDEO BRONCHOSCOPY WITH RADIAL ENDOBRONCHIAL ULTRASOUND;  Surgeon: Collene Gobble, MD;  Location: [facility] ENDOSCOPY;  Service: Pulmonary;;    SOCIAL HISTORY:  Social History   Socioeconomic History   Marital status: Divorced    Spouse name: Not on file   Number of children: 2   Years of education: Not on file   Highest education level: Not on file  Occupational History   Not on file  Tobacco Use   Smoking status: Former    Packs/day: 1.00    Years: 42.00    Total pack years: 42.00    Types: Cigarettes    Quit date: 02/22/2021    Years since quitting: 1.0   Smokeless tobacco:  Never  Vaping Use   Vaping Use: Never used  Substance and Sexual Activity   Alcohol use: No    Alcohol/week: 0.0 standard drinks of alcohol   Drug use: No   Sexual activity: Never    Birth control/protection: None  Other Topics Concern   Not on file  Social History Narrative   Not on file   Social Determinants of Health   Financial Resource Strain: Not on file  Food Insecurity: Not on file  Transportation Needs: Not on file  Physical Activity: Not  on file  Stress: Not on file  Social Connections: Unknown (08/05/2021)   Social Connection and Isolation Panel [NHANES]    Frequency of Communication with Friends and Family: More than three times a week    Frequency of Social Gatherings with Friends and Family: More than three times a week    Attends Religious Services: Not on Advertising copywriter or Organizations: Not on file    Attends Archivist Meetings: Not on file    Marital Status: Not on file  Intimate Partner Violence: Not on file    FAMILY HISTORY:  Family History  Problem Relation Age of Onset   Diabetes Mother    Ovarian cancer Mother 45   Other Father        died in MVA at age 69   Other Sister        mitral valve disorder   Melanoma Sister 33       removed from back of leg   Colon cancer Brother        early 52s, succumbed to disease   Cancer Paternal Aunt        leukemia, bone, or lung cancer   Alzheimer's disease Maternal Grandmother    Heart attack Maternal Grandfather    Heart attack Paternal Grandmother    COPD Paternal Grandfather    Emphysema Paternal Grandfather    Congestive Heart Failure Paternal Aunt    Stomach cancer Paternal Uncle    Kidney cancer Maternal Uncle    Cancer Cousin        dx. teens/dx. 69s   Cancer Cousin    Colon cancer Cousin     CURRENT MEDICATIONS:  Current Outpatient Medications  Medication Sig Dispense Refill   albuterol (PROVENTIL HFA;VENTOLIN HFA) 108 (90 BASE) MCG/ACT inhaler Inhale 2 puffs  into the lungs every 6 (six) hours as needed for wheezing or shortness of breath.      ALPRAZolam (XANAX) 0.5 MG tablet Take 0.5 mg by mouth 3 (three) times daily.     ANORO ELLIPTA 62.5-25 MCG/INH AEPB Inhale 1 puff into the lungs daily.     calcium carbonate (TUMS - DOSED IN MG ELEMENTAL CALCIUM) 500 MG chewable tablet Chew 2 tablets by mouth 3 (three) times daily as needed for indigestion or heartburn.     fluconazole (DIFLUCAN) 100 MG tablet Take 2 tablets today, then 1 tablet daily x 20 more days. 22 tablet 0   furosemide (LASIX) 20 MG tablet Take 20 mg by mouth daily.     hydrochlorothiazide (HYDRODIURIL) 12.5 MG tablet Take 12.5 mg by mouth daily.     HYDROcodone-acetaminophen (HYCET) 7.5-325 mg/15 ml solution Take 10 mLs by mouth every 6 (six) hours as needed for moderate pain or severe pain. 473 mL 0   ibuprofen (ADVIL) 100 MG/5ML suspension Take 10 mLs (200 mg total) by mouth every 4 (four) hours as needed for moderate pain. 473 mL 0   lidocaine (XYLOCAINE) 2 % solution Patient: Mix 1part 2% viscous lidocaine, 1part H20. Swallow 70mL of diluted mixture, 57min before meals and at bedtime, up to QID for sore throat. 200 mL 3   lidocaine-prilocaine (EMLA) cream Apply 1 application. topically as needed. 30 g 0   Menthol, Topical Analgesic, (BIOFREEZE EX) Apply 1 application topically daily as needed (pain).     nystatin (MYCOSTATIN) 100000 UNIT/ML suspension Take 5 mLs (500,000 Units total) by mouth 4 (four) times daily. 60 mL 0   ondansetron (ZOFRAN) 8 MG  tablet Take 1 tablet (8 mg total) by mouth every 8 (eight) hours as needed for nausea or vomiting. 60 tablet 0   pantoprazole (PROTONIX) 40 MG tablet Take 1 tablet by mouth once daily 90 tablet 0   polyethylene glycol (MIRALAX / GLYCOLAX) 17 g packet Take 17 g by mouth daily.     Potassium Chloride 40 MEQ/15ML (20%) SOLN Take 20 mEq by mouth 2 (two) times daily. 473 mL 0   prochlorperazine (COMPAZINE) 10 MG tablet Take 1 tablet (10 mg total)  by mouth every 6 (six) hours as needed for nausea or vomiting. 60 tablet 0   sertraline (ZOLOFT) 100 MG tablet Take 100 mg by mouth daily.     sucralfate (CARAFATE) 1 g tablet Dissolve 1 tablet in 10 mL H20 and swallow 30 min prior to meals and bedtime. 40 tablet 3   Vitamin D, Ergocalciferol, 50000 units CAPS Take 1 capsule by mouth once a week.     No current facility-administered medications for this visit.   Facility-Administered Medications Ordered in Other Visits  Medication Dose Route Frequency Provider Last Rate Last Admin   sodium chloride flush (NS) 0.9 % injection 10 mL  10 mL Intravenous PRN Derek Jack, MD   10 mL at 03/29/22 1524    ALLERGIES:  No Known Allergies  PHYSICAL EXAM:  Performance status (ECOG): 1 - Symptomatic but completely ambulatory  There were no vitals filed for this visit. Wt Readings from Last 3 Encounters:  03/29/22 179 lb 12.8 oz (81.6 kg)  03/15/22 179 lb 9.6 oz (81.5 kg)  03/01/22 183 lb 12.8 oz (83.4 kg)   Physical Exam Vitals reviewed.  Constitutional:      Appearance: Normal appearance. She is obese.  Cardiovascular:     Rate and Rhythm: Normal rate and regular rhythm.     Pulses: Normal pulses.     Heart sounds: Normal heart sounds.  Pulmonary:     Effort: Pulmonary effort is normal.     Breath sounds: Normal breath sounds.  Neurological:     General: No focal deficit present.     Mental Status: She is alert and oriented to person, place, and time.  Psychiatric:        Mood and Affect: Mood normal.        Behavior: Behavior normal.     LABORATORY DATA:  I have reviewed the labs as listed.     Latest Ref Rng & Units 03/29/2022   12:16 PM 03/15/2022   12:07 PM 03/01/2022    9:29 AM  CBC  WBC 4.0 - 10.5 K/uL 5.7  5.4  4.2   Hemoglobin 12.0 - 15.0 g/dL 12.5  13.6  13.1   Hematocrit 36.0 - 46.0 % 36.4  38.6  38.2   Platelets 150 - 400 K/uL 181  170  163       Latest Ref Rng & Units 03/29/2022   12:16 PM 03/15/2022    12:07 PM 03/01/2022    9:29 AM  CMP  Glucose 70 - 99 mg/dL 134  122  128   BUN 8 - 23 mg/dL $Remove'14  20  18   'gONDgvf$ Creatinine 0.44 - 1.00 mg/dL 0.88  0.93  0.76   Sodium 135 - 145 mmol/L 138  138  137   Potassium 3.5 - 5.1 mmol/L 2.5  2.8  3.6   Chloride 98 - 111 mmol/L 97  99  103   CO2 22 - 32 mmol/L 33  29  28  Calcium 8.9 - 10.3 mg/dL 9.2  9.5  9.6   Total Protein 6.5 - 8.1 g/dL 6.8  7.1  7.0   Total Bilirubin 0.3 - 1.2 mg/dL 1.5  1.9  1.9   Alkaline Phos 38 - 126 U/L 67  69  67   AST 15 - 41 U/L $Remo'13  15  20   'dOjCy$ ALT 0 - 44 U/L $Remo'9  10  17     'YDxJJ$ DIAGNOSTIC IMAGING:  I have independently reviewed the scans and discussed with the patient. No results found.   ASSESSMENT:  Stage III squamous cell cancer: - LLL lung mass FNA (08/23/2021): Malignant cells consistent with squamous cell carcinoma. - Lymph node 4R and 7: Malignant cells consistent with NSCLC. - Chemoradiation therapy with weekly carboplatin and paclitaxel from 11/01/2021, week for chemotherapy on 11/23/2021.  XRT completed on 12/16/2021. - She missed further doses of chemotherapy secondary to thrombocytopenia. - PET scan (01/27/2022): Interval response to therapy with decreasing size of mediastinal lymph nodes.  No new disease.    Social/family history: - Seen today with her Sister Neoma Laming.  Patient lives by herself.  Quit smoking on 02/22/2021.  Smoked 2 packs/day for 40 years. - Mother died of ovarian cancer.  Brother died of colon cancer at age 46.  Sister had melanoma.  Paternal uncles had stomach cancer and lung cancer.  Paternal aunt had thyroid cancer.  Another paternal aunt had pancreatic cancer.  3.  Supraglottic squamous cell carcinoma (T1N1): - Definitive radiation from 08/16/2021 through 09/13/2021  PLAN:  Stage III squamous cell lung cancer: - She has diarrhea on the day of treatment with Imfinzi.  Otherwise no immunotherapy related side effects. - She thought her throat pain and hoarseness were was from immunotherapy. - She was  not able to get her pain medication due to short supply.  I have reassured her that worsening of throat pain and hoarseness is secondary to decreased intake of pain medication.       -Reviewed labs today which showed normal LFTs and CBC.  She does not want to start treatment today as it is her birthday.  She will come back next week to proceed with next cycle.  I will see her back in 5 weeks with repeat CT scan of the chest.  2.  Supraglottic squamous cell carcinoma (T1N1): - PET scan on 01/27/2022: No evidence of disease. - She was seen by Dr. Redmond Baseman of ENT recently.  Laryngeal exam showed inflammation and prominent webbing of the anterior glottis with no evidence of tumor.  3.  Throat pain and sternal chest pain: - Continue hydrocodone 10 mg liquid every 6 hours as needed.  4.  Hypokalemia: - She did not take potassium for the last 4 to 5 days.  Potassium today is 2.5.  Will replete potassium.  I have asked her to start back on potassium 20 mEq daily.  5.  Constipation: - Continue stool softener and MiraLAX.  If no improvement will consider lactulose.    Orders placed this encounter:  Orders Placed This Encounter  Procedures   [place], MD [place] [phone]

## 2022-03-29 NOTE — Progress Notes (Signed)
CRITICAL VALUE ALERT Critical value received:  Potassium 2.5 Date of notification:  03-29-2022 Time of notification: 13:14 pm Critical value read back:  Yes.   Nurse who received alert:  B. Sebert Stollings RN MD notified time and response:  Dr. Delton Coombes @ 13:20 pm. Standing orders in place.

## 2022-03-29 NOTE — Progress Notes (Signed)
Patient presents today for chemotherapy infusion. Patient is in satisfactory condition with no new complaints voiced.  Vital signs are stable. Labs reviewed.  Potassium today is 2.5.  All other labs are within treatment parameters.  Patient declined treatment as it is her birthday and she did not want to be treated today.  Patient will receive Potassium 40 mEq PO x 2 doses and Potassium 20 mEq IV in 1 L of NS today per MD orders.  Per Dr. Delton Coombes- we will run NS with Potassium over one hour.  We will proceed as ordered.    Patient tolerated infusion well with no complaints voiced.  Patient left ambulatory in stable condition.  Vital signs stable at discharge.  Follow up as scheduled.

## 2022-03-31 ENCOUNTER — Encounter: Payer: Self-pay | Admitting: Hematology and Oncology

## 2022-03-31 ENCOUNTER — Encounter: Payer: Self-pay | Admitting: Hematology

## 2022-04-04 ENCOUNTER — Other Ambulatory Visit: Payer: Self-pay | Admitting: *Deleted

## 2022-04-04 ENCOUNTER — Other Ambulatory Visit (HOSPITAL_COMMUNITY): Payer: Self-pay

## 2022-04-04 MED ORDER — HYDROCODONE-ACETAMINOPHEN 7.5-325 MG/15ML PO SOLN: 10.0000 mL | Freq: Four times a day (QID) | ORAL | 0 refills | Status: DC | PRN

## 2022-04-05 ENCOUNTER — Other Ambulatory Visit: Payer: Self-pay | Admitting: Nurse Practitioner

## 2022-04-05 ENCOUNTER — Other Ambulatory Visit: Payer: Self-pay | Admitting: *Deleted

## 2022-04-05 ENCOUNTER — Other Ambulatory Visit (HOSPITAL_COMMUNITY): Payer: Self-pay

## 2022-04-05 MED ORDER — HYDROCODONE-ACETAMINOPHEN 7.5-325 MG/15ML PO SOLN
15.0000 mL | Freq: Four times a day (QID) | ORAL | 0 refills | Status: DC | PRN
  Filled 2022-04-05: qty 473, 8d supply, fill #0

## 2022-04-05 MED ORDER — HYDROCODONE-ACETAMINOPHEN 7.5-325 MG/15ML PO SOLN
15.0000 mL | Freq: Four times a day (QID) | ORAL | 0 refills | Status: DC | PRN
  Filled 2022-04-12: qty 1680, 28d supply, fill #0

## 2022-04-05 MED ORDER — HYDROCODONE-ACETAMINOPHEN 7.5-325 MG/15ML PO SOLN
15.0000 mL | Freq: Four times a day (QID) | ORAL | 0 refills | Status: DC | PRN
Start: 2022-04-05 — End: 2022-04-05
  Filled 2022-04-05: qty 473, 8d supply, fill #0

## 2022-04-05 NOTE — Telephone Encounter (Signed)
Refill given on Hycet

## 2022-04-06 ENCOUNTER — Inpatient Hospital Stay: Payer: Medicare HMO

## 2022-04-06 VITALS — BP 98/69 | HR 82 | Temp 97.3°F | Resp 20

## 2022-04-06 DIAGNOSIS — C50412 Malignant neoplasm of upper-outer quadrant of left female breast: Secondary | ICD-10-CM | POA: Diagnosis not present

## 2022-04-06 DIAGNOSIS — R070 Pain in throat: Secondary | ICD-10-CM | POA: Diagnosis not present

## 2022-04-06 DIAGNOSIS — Z87891 Personal history of nicotine dependence: Secondary | ICD-10-CM | POA: Diagnosis not present

## 2022-04-06 DIAGNOSIS — Z79899 Other long term (current) drug therapy: Secondary | ICD-10-CM | POA: Diagnosis not present

## 2022-04-06 DIAGNOSIS — C321 Malignant neoplasm of supraglottis: Secondary | ICD-10-CM | POA: Diagnosis not present

## 2022-04-06 DIAGNOSIS — Z79811 Long term (current) use of aromatase inhibitors: Secondary | ICD-10-CM | POA: Diagnosis not present

## 2022-04-06 DIAGNOSIS — C3432 Malignant neoplasm of lower lobe, left bronchus or lung: Secondary | ICD-10-CM | POA: Diagnosis not present

## 2022-04-06 DIAGNOSIS — R519 Headache, unspecified: Secondary | ICD-10-CM | POA: Diagnosis not present

## 2022-04-06 DIAGNOSIS — T451X5A Adverse effect of antineoplastic and immunosuppressive drugs, initial encounter: Secondary | ICD-10-CM | POA: Diagnosis not present

## 2022-04-06 DIAGNOSIS — R499 Unspecified voice and resonance disorder: Secondary | ICD-10-CM | POA: Diagnosis not present

## 2022-04-06 DIAGNOSIS — Z808 Family history of malignant neoplasm of other organs or systems: Secondary | ICD-10-CM | POA: Diagnosis not present

## 2022-04-06 DIAGNOSIS — Z8041 Family history of malignant neoplasm of ovary: Secondary | ICD-10-CM | POA: Diagnosis not present

## 2022-04-06 DIAGNOSIS — R490 Dysphonia: Secondary | ICD-10-CM | POA: Diagnosis not present

## 2022-04-06 DIAGNOSIS — Z170 Estrogen receptor positive status [ER+]: Secondary | ICD-10-CM | POA: Diagnosis not present

## 2022-04-06 DIAGNOSIS — Z5112 Encounter for antineoplastic immunotherapy: Secondary | ICD-10-CM | POA: Diagnosis not present

## 2022-04-06 DIAGNOSIS — J449 Chronic obstructive pulmonary disease, unspecified: Secondary | ICD-10-CM | POA: Diagnosis not present

## 2022-04-06 DIAGNOSIS — E876 Hypokalemia: Secondary | ICD-10-CM | POA: Diagnosis not present

## 2022-04-06 DIAGNOSIS — Z923 Personal history of irradiation: Secondary | ICD-10-CM | POA: Diagnosis not present

## 2022-04-06 DIAGNOSIS — C3490 Malignant neoplasm of unspecified part of unspecified bronchus or lung: Secondary | ICD-10-CM

## 2022-04-06 DIAGNOSIS — J029 Acute pharyngitis, unspecified: Secondary | ICD-10-CM | POA: Diagnosis not present

## 2022-04-06 DIAGNOSIS — Z8719 Personal history of other diseases of the digestive system: Secondary | ICD-10-CM | POA: Diagnosis not present

## 2022-04-06 DIAGNOSIS — R197 Diarrhea, unspecified: Secondary | ICD-10-CM | POA: Diagnosis not present

## 2022-04-06 DIAGNOSIS — Z9049 Acquired absence of other specified parts of digestive tract: Secondary | ICD-10-CM | POA: Diagnosis not present

## 2022-04-06 DIAGNOSIS — D6959 Other secondary thrombocytopenia: Secondary | ICD-10-CM | POA: Diagnosis not present

## 2022-04-06 DIAGNOSIS — K5900 Constipation, unspecified: Secondary | ICD-10-CM | POA: Diagnosis not present

## 2022-04-06 LAB — CBC WITH DIFFERENTIAL/PLATELET
Abs Immature Granulocytes: 0.02 10*3/uL (ref 0.00–0.07)
Basophils Absolute: 0 10*3/uL (ref 0.0–0.1)
Basophils Relative: 0 %
Eosinophils Absolute: 0.1 10*3/uL (ref 0.0–0.5)
Eosinophils Relative: 2 %
HCT: 39.2 % (ref 36.0–46.0)
Hemoglobin: 13.1 g/dL (ref 12.0–15.0)
Immature Granulocytes: 0 %
Lymphocytes Relative: 11 %
Lymphs Abs: 0.6 10*3/uL — ABNORMAL LOW (ref 0.7–4.0)
MCH: 31 pg (ref 26.0–34.0)
MCHC: 33.4 g/dL (ref 30.0–36.0)
MCV: 92.7 fL (ref 80.0–100.0)
Monocytes Absolute: 0.4 10*3/uL (ref 0.1–1.0)
Monocytes Relative: 7 %
Neutro Abs: 4 10*3/uL (ref 1.7–7.7)
Neutrophils Relative %: 80 %
Platelets: 191 10*3/uL (ref 150–400)
RBC: 4.23 MIL/uL (ref 3.87–5.11)
RDW: 14.2 % (ref 11.5–15.5)
WBC: 5.1 10*3/uL (ref 4.0–10.5)
nRBC: 0 % (ref 0.0–0.2)

## 2022-04-06 LAB — COMPREHENSIVE METABOLIC PANEL
ALT: 8 U/L (ref 0–44)
AST: 12 U/L — ABNORMAL LOW (ref 15–41)
Albumin: 3.3 g/dL — ABNORMAL LOW (ref 3.5–5.0)
Alkaline Phosphatase: 66 U/L (ref 38–126)
Anion gap: 4 — ABNORMAL LOW (ref 5–15)
BUN: 15 mg/dL (ref 8–23)
CO2: 25 mmol/L (ref 22–32)
Calcium: 9 mg/dL (ref 8.9–10.3)
Chloride: 108 mmol/L (ref 98–111)
Creatinine, Ser: 0.88 mg/dL (ref 0.44–1.00)
GFR, Estimated: >60 mL/min (ref >60)
Glucose, Bld: 124 mg/dL — ABNORMAL HIGH (ref 70–99)
Potassium: 3.8 mmol/L (ref 3.5–5.1)
Sodium: 137 mmol/L (ref 135–145)
Total Bilirubin: 1.2 mg/dL (ref 0.3–1.2)
Total Protein: 6.9 g/dL (ref 6.5–8.1)

## 2022-04-06 LAB — MAGNESIUM: Magnesium: 2.2 mg/dL (ref 1.7–2.4)

## 2022-04-06 MED ORDER — SODIUM CHLORIDE 0.9 % IV SOLN: Freq: Once | INTRAVENOUS | Status: AC

## 2022-04-06 MED ORDER — SODIUM CHLORIDE 0.9% FLUSH
10.0000 mL | INTRAVENOUS | Status: DC | PRN
  Administered 2022-04-06: 10 mL

## 2022-04-06 MED ORDER — SODIUM CHLORIDE 0.9 % IV SOLN
10.0000 mg/kg | Freq: Once | INTRAVENOUS | Status: AC
  Administered 2022-04-06: 860 mg via INTRAVENOUS
  Filled 2022-04-06: qty 10

## 2022-04-06 MED ORDER — HEPARIN SOD (PORK) LOCK FLUSH 100 UNIT/ML IV SOLN
500.0000 [IU] | Freq: Once | INTRAVENOUS | Status: AC | PRN
  Administered 2022-04-06: 500 [IU]

## 2022-04-06 NOTE — Patient Instructions (Signed)
[facility]  Discharge Instructions: Thank you for choosing [facility] to provide your oncology and hematology care.  If you have a lab appointment with the [facility], please come in thru the Main Entrance and check in at the main information desk.  Wear comfortable clothing and clothing appropriate for easy access to any Portacath or PICC line.   We strive to give you quality time with your provider. You may need to reschedule your appointment if you arrive late (15 or more minutes).  Arriving late affects you and other patients whose appointments are after yours.  Also, if you miss three or more appointments without notifying the office, you may be dismissed from the clinic at the provider's discretion.      For prescription refill requests, have your pharmacy contact our office and allow 72 hours for refills to be completed.    Today you received the following chemotherapy and/or immunotherapy agents imfinzi.  Durvalumab Injection What is this medication? DURVALUMAB (dur VAL ue mab) treats some types of cancer. It works by helping your immune system slow or stop the spread of cancer cells. It is a monoclonal antibody. This medicine may be used for other purposes; ask your health care provider or pharmacist if you have questions. COMMON BRAND NAME(S): IMFINZI What should I tell my care team before I take this medication? They need to know if you have any of these conditions: Allogeneic stem cell transplant (uses someone else's stem cells) Autoimmune diseases, such as Crohn disease, ulcerative colitis, lupus History of chest radiation Nervous system problems, such as Guillain-Barre syndrome, myasthenia gravis Organ transplant An unusual or allergic reaction to durvalumab, other medications, foods, dyes, or preservatives Pregnant or trying to get pregnant Breast-feeding How should I use this medication? This medication is infused into a vein. It is  given by your care team in a hospital or clinic setting. A special MedGuide will be given to you before each treatment. Be sure to read this information carefully each time. Talk to your care team about the use of this medication in children. Special care may be needed. Overdosage: If you think you have taken too much of this medicine contact a poison control center or emergency room at once. NOTE: This medicine is only for you. Do not share this medicine with others. What if I miss a dose? Keep appointments for follow-up doses. It is important not to miss your dose. Call your care team if you are unable to keep an appointment. What may interact with this medication? Interactions have not been studied. This list may not describe all possible interactions. Give your health care provider a list of all the medicines, herbs, non-prescription drugs, or dietary supplements you use. Also tell them if you smoke, drink alcohol, or use illegal drugs. Some items may interact with your medicine. What should I watch for while using this medication? Your condition will be monitored carefully while you are receiving this medication. You may need blood work while taking this medication. This medication may cause serious skin reactions. They can happen weeks to months after starting the medication. Contact your care team right away if you notice fevers or flu-like symptoms with a rash. The rash may be red or purple and then turn into blisters or peeling of the skin. You may also notice a red rash with swelling of the face, lips, or lymph nodes in your neck or under your arms. Tell your care team right away  if you have any change in your eyesight. Talk to your care team if you may be pregnant. Serious birth defects can occur if you take this medication during pregnancy and for 3 months after the last dose. You will need a negative pregnancy test before starting this medication. Contraception is recommended while taking  this medication and for 3 months after the last dose. Your care team can help you find the option that works for you. Do not breastfeed while taking this medication and for 3 months after the last dose. What side effects may I notice from receiving this medication? Side effects that you should report to your care team as soon as possible: Allergic reactions--skin rash, itching, hives, swelling of the face, lips, tongue, or throat Dry cough, shortness of breath or trouble breathing Eye pain, redness, irritation, or discharge with blurry or decreased vision Heart muscle inflammation--unusual weakness or fatigue, shortness of breath, chest pain, fast or irregular heartbeat, dizziness, swelling of the ankles, feet, or hands Hormone gland problems--headache, sensitivity to light, unusual weakness or fatigue, dizziness, fast or irregular heartbeat, increased sensitivity to cold or heat, excessive sweating, constipation, hair loss, increased thirst or amount of urine, tremors or shaking, irritability Infusion reactions--chest pain, shortness of breath or trouble breathing, feeling faint or lightheaded Kidney injury (glomerulonephritis)--decrease in the amount of urine, red or dark brown urine, foamy or bubbly urine, swelling of the ankles, hands, or feet Liver injury--right upper belly pain, loss of appetite, nausea, light-colored stool, dark yellow or brown urine, yellowing skin or eyes, unusual weakness or fatigue Pain, tingling, or numbness in the hands or feet, muscle weakness, change in vision, confusion or trouble speaking, loss of balance or coordination, trouble walking, seizures Rash, fever, and swollen lymph nodes Redness, blistering, peeling, or loosening of the skin, including inside the mouth Sudden or severe stomach pain, bloody diarrhea, fever, nausea, vomiting Side effects that usually do not require medical attention (report these to your care team if they continue or are  bothersome): Bone, joint, or muscle pain Diarrhea Fatigue Loss of appetite Nausea Skin rash This list may not describe all possible side effects. Call your doctor for medical advice about side effects. You may report side effects to FDA at 1-800-FDA-1088. Where should I keep my medication? This medication is given in a hospital or clinic. It will not be stored at home. NOTE: This sheet is a summary. It may not cover all possible information. If you have questions about this medicine, talk to your doctor, pharmacist, or health care provider.  2023 Elsevier/Gold Standard (2020-12-30 00:00:00)       To help prevent nausea and vomiting after your treatment, we encourage you to take your nausea medication as directed.  BELOW ARE SYMPTOMS THAT SHOULD BE REPORTED IMMEDIATELY: *FEVER GREATER THAN 100.4 F (38 C) OR HIGHER *CHILLS OR SWEATING *NAUSEA AND VOMITING THAT IS NOT CONTROLLED WITH YOUR NAUSEA MEDICATION *UNUSUAL SHORTNESS OF BREATH *UNUSUAL BRUISING OR BLEEDING *URINARY PROBLEMS (pain or burning when urinating, or frequent urination) *BOWEL PROBLEMS (unusual diarrhea, constipation, pain near the anus) TENDERNESS IN MOUTH AND THROAT WITH OR WITHOUT PRESENCE OF ULCERS (sore throat, sores in mouth, or a toothache) UNUSUAL RASH, SWELLING OR PAIN  UNUSUAL VAGINAL DISCHARGE OR ITCHING   Items with * indicate a potential emergency and should be followed up as soon as possible or go to the Emergency Department if any problems should occur.  Please show the CHEMOTHERAPY ALERT CARD or IMMUNOTHERAPY ALERT CARD at check-in to the  Emergency Department and triage nurse.  Should you have questions after your visit or need to cancel or reschedule your appointment, please contact [facility] [phone]  and follow the prompts.  Office hours are 8:00 a.m. to 4:30 p.m. Monday - Friday. Please note that voicemails left after 4:00 p.m. may not be returned until the following  business day.  We are closed weekends and major holidays. You have access to a nurse at all times for urgent questions. Please call the main number to the clinic [phone] and follow the prompts.  For any non-urgent questions, you may also contact your provider using MyChart. We now offer e-Visits for anyone 67 and older to request care online for non-urgent symptoms. For details visit [email or web address].   Also download the MyChart app! Go to the app store, search "MyChart", open the app, select [facility], and log in with your MyChart username and password.  Masks are optional in the cancer centers. If you would like for your care team to wear a mask while they are taking care of you, please let them know. You may have one support person who is at least 69 years old accompany you for your appointments.

## 2022-04-06 NOTE — Progress Notes (Signed)
Patient tolerated chemotherapy with no complaints voiced.  Side effects with management reviewed with understanding verbalized.  Port site clean and dry with no bruising or swelling noted at site.  Good blood return noted before and after administration of chemotherapy.  Band aid applied.  Patient left in satisfactory condition with VSS and no s/s of distress noted.   

## 2022-04-07 ENCOUNTER — Telehealth: Payer: Medicare HMO | Admitting: Licensed Clinical Social Worker

## 2022-04-07 ENCOUNTER — Telehealth: Payer: Self-pay | Admitting: Licensed Clinical Social Worker

## 2022-04-07 NOTE — Telephone Encounter (Signed)
Received voicemail from patient that she would like to cancel her genetic counseling appointment. She does not feel well enough to attend today and has had genetic testing in the past.

## 2022-04-12 ENCOUNTER — Inpatient Hospital Stay: Payer: Medicare HMO

## 2022-04-12 ENCOUNTER — Encounter: Payer: Self-pay | Admitting: Hematology

## 2022-04-12 ENCOUNTER — Encounter: Payer: Self-pay | Admitting: Hematology and Oncology

## 2022-04-12 ENCOUNTER — Other Ambulatory Visit (HOSPITAL_COMMUNITY): Payer: Self-pay

## 2022-04-19 ENCOUNTER — Other Ambulatory Visit (HOSPITAL_COMMUNITY): Payer: Self-pay

## 2022-04-20 ENCOUNTER — Inpatient Hospital Stay: Payer: Medicare HMO

## 2022-04-20 VITALS — BP 100/69 | HR 73 | Temp 98.3°F | Resp 20

## 2022-04-20 DIAGNOSIS — R519 Headache, unspecified: Secondary | ICD-10-CM | POA: Diagnosis not present

## 2022-04-20 DIAGNOSIS — T451X5A Adverse effect of antineoplastic and immunosuppressive drugs, initial encounter: Secondary | ICD-10-CM | POA: Diagnosis not present

## 2022-04-20 DIAGNOSIS — C3490 Malignant neoplasm of unspecified part of unspecified bronchus or lung: Secondary | ICD-10-CM

## 2022-04-20 DIAGNOSIS — Z8719 Personal history of other diseases of the digestive system: Secondary | ICD-10-CM | POA: Diagnosis not present

## 2022-04-20 DIAGNOSIS — Z170 Estrogen receptor positive status [ER+]: Secondary | ICD-10-CM | POA: Diagnosis not present

## 2022-04-20 DIAGNOSIS — Z5112 Encounter for antineoplastic immunotherapy: Secondary | ICD-10-CM | POA: Diagnosis not present

## 2022-04-20 DIAGNOSIS — R490 Dysphonia: Secondary | ICD-10-CM | POA: Diagnosis not present

## 2022-04-20 DIAGNOSIS — J029 Acute pharyngitis, unspecified: Secondary | ICD-10-CM | POA: Diagnosis not present

## 2022-04-20 DIAGNOSIS — Z87891 Personal history of nicotine dependence: Secondary | ICD-10-CM | POA: Diagnosis not present

## 2022-04-20 DIAGNOSIS — C50412 Malignant neoplasm of upper-outer quadrant of left female breast: Secondary | ICD-10-CM | POA: Diagnosis not present

## 2022-04-20 DIAGNOSIS — R499 Unspecified voice and resonance disorder: Secondary | ICD-10-CM | POA: Diagnosis not present

## 2022-04-20 DIAGNOSIS — Z808 Family history of malignant neoplasm of other organs or systems: Secondary | ICD-10-CM | POA: Diagnosis not present

## 2022-04-20 DIAGNOSIS — C321 Malignant neoplasm of supraglottis: Secondary | ICD-10-CM | POA: Diagnosis not present

## 2022-04-20 DIAGNOSIS — R197 Diarrhea, unspecified: Secondary | ICD-10-CM | POA: Diagnosis not present

## 2022-04-20 DIAGNOSIS — C3432 Malignant neoplasm of lower lobe, left bronchus or lung: Secondary | ICD-10-CM | POA: Diagnosis not present

## 2022-04-20 DIAGNOSIS — Z9049 Acquired absence of other specified parts of digestive tract: Secondary | ICD-10-CM | POA: Diagnosis not present

## 2022-04-20 DIAGNOSIS — D6959 Other secondary thrombocytopenia: Secondary | ICD-10-CM | POA: Diagnosis not present

## 2022-04-20 DIAGNOSIS — Z923 Personal history of irradiation: Secondary | ICD-10-CM | POA: Diagnosis not present

## 2022-04-20 DIAGNOSIS — R070 Pain in throat: Secondary | ICD-10-CM | POA: Diagnosis not present

## 2022-04-20 DIAGNOSIS — Z79899 Other long term (current) drug therapy: Secondary | ICD-10-CM | POA: Diagnosis not present

## 2022-04-20 DIAGNOSIS — J449 Chronic obstructive pulmonary disease, unspecified: Secondary | ICD-10-CM | POA: Diagnosis not present

## 2022-04-20 DIAGNOSIS — E876 Hypokalemia: Secondary | ICD-10-CM | POA: Diagnosis not present

## 2022-04-20 DIAGNOSIS — Z8041 Family history of malignant neoplasm of ovary: Secondary | ICD-10-CM | POA: Diagnosis not present

## 2022-04-20 DIAGNOSIS — K5900 Constipation, unspecified: Secondary | ICD-10-CM | POA: Diagnosis not present

## 2022-04-20 DIAGNOSIS — Z79811 Long term (current) use of aromatase inhibitors: Secondary | ICD-10-CM | POA: Diagnosis not present

## 2022-04-20 LAB — COMPREHENSIVE METABOLIC PANEL
ALT: 8 U/L (ref 0–44)
AST: 14 U/L — ABNORMAL LOW (ref 15–41)
Albumin: 3.5 g/dL (ref 3.5–5.0)
Alkaline Phosphatase: 63 U/L (ref 38–126)
Anion gap: 7 (ref 5–15)
BUN: 18 mg/dL (ref 8–23)
CO2: 27 mmol/L (ref 22–32)
Calcium: 9.5 mg/dL (ref 8.9–10.3)
Chloride: 104 mmol/L (ref 98–111)
Creatinine, Ser: 0.88 mg/dL (ref 0.44–1.00)
GFR, Estimated: >60 mL/min (ref >60)
Glucose, Bld: 127 mg/dL — ABNORMAL HIGH (ref 70–99)
Potassium: 3.9 mmol/L (ref 3.5–5.1)
Sodium: 138 mmol/L (ref 135–145)
Total Bilirubin: 1.7 mg/dL — ABNORMAL HIGH (ref 0.3–1.2)
Total Protein: 6.9 g/dL (ref 6.5–8.1)

## 2022-04-20 LAB — CBC WITH DIFFERENTIAL/PLATELET
Abs Immature Granulocytes: 0.02 10*3/uL (ref 0.00–0.07)
Basophils Absolute: 0 10*3/uL (ref 0.0–0.1)
Basophils Relative: 1 %
Eosinophils Absolute: 0.1 10*3/uL (ref 0.0–0.5)
Eosinophils Relative: 2 %
HCT: 39.5 % (ref 36.0–46.0)
Hemoglobin: 13.3 g/dL (ref 12.0–15.0)
Immature Granulocytes: 0 %
Lymphocytes Relative: 12 %
Lymphs Abs: 0.7 10*3/uL (ref 0.7–4.0)
MCH: 30.9 pg (ref 26.0–34.0)
MCHC: 33.7 g/dL (ref 30.0–36.0)
MCV: 91.6 fL (ref 80.0–100.0)
Monocytes Absolute: 0.3 10*3/uL (ref 0.1–1.0)
Monocytes Relative: 6 %
Neutro Abs: 4.3 10*3/uL (ref 1.7–7.7)
Neutrophils Relative %: 79 %
Platelets: 184 10*3/uL (ref 150–400)
RBC: 4.31 MIL/uL (ref 3.87–5.11)
RDW: 13.5 % (ref 11.5–15.5)
WBC: 5.4 10*3/uL (ref 4.0–10.5)
nRBC: 0 % (ref 0.0–0.2)

## 2022-04-20 LAB — TSH: TSH: 7.519 u[IU]/mL — ABNORMAL HIGH (ref 0.350–4.500)

## 2022-04-20 MED ORDER — SODIUM CHLORIDE 0.9 % IV SOLN
10.0000 mg/kg | Freq: Once | INTRAVENOUS | Status: AC
  Administered 2022-04-20: 860 mg via INTRAVENOUS
  Filled 2022-04-20: qty 10

## 2022-04-20 MED ORDER — SODIUM CHLORIDE 0.9% FLUSH
10.0000 mL | INTRAVENOUS | Status: DC | PRN
  Administered 2022-04-20: 10 mL

## 2022-04-20 MED ORDER — SODIUM CHLORIDE 0.9 % IV SOLN: Freq: Once | INTRAVENOUS | Status: AC

## 2022-04-20 MED ORDER — HEPARIN SOD (PORK) LOCK FLUSH 100 UNIT/ML IV SOLN
500.0000 [IU] | Freq: Once | INTRAVENOUS | Status: AC | PRN
  Administered 2022-04-20: 500 [IU]

## 2022-04-20 NOTE — Progress Notes (Signed)
Patient presents today for Imfinzi infusion.  Patient is in satisfactory condition with no complaints voiced.  Vital signs are stable.  Labs reviewed.  Bilirubin today is 1.7.  MD made aware.  All other labs are within treatment parameters.  We will proceed with treatment per MD orders.   Patient tolerated treatment well with no complaints voiced.  Patient left ambulatory in stable condition.  Vital signs stable at discharge.  Follow up as scheduled.

## 2022-04-20 NOTE — Patient Instructions (Signed)
[facility]  Discharge Instructions: Thank you for choosing [facility] to provide your oncology and hematology care.  If you have a lab appointment with the [facility], please come in thru the Main Entrance and check in at the main information desk.  Wear comfortable clothing and clothing appropriate for easy access to any Portacath or PICC line.   We strive to give you quality time with your provider. You may need to reschedule your appointment if you arrive late (15 or more minutes).  Arriving late affects you and other patients whose appointments are after yours.  Also, if you miss three or more appointments without notifying the office, you may be dismissed from the clinic at the provider's discretion.      For prescription refill requests, have your pharmacy contact our office and allow 72 hours for refills to be completed.    Today you received the following chemotherapy and/or immunotherapy agents Imfinzi.  Durvalumab Injection What is this medication? DURVALUMAB (dur VAL ue mab) treats some types of cancer. It works by helping your immune system slow or stop the spread of cancer cells. It is a monoclonal antibody. This medicine may be used for other purposes; ask your health care provider or pharmacist if you have questions. COMMON BRAND NAME(S): IMFINZI What should I tell my care team before I take this medication? They need to know if you have any of these conditions: Allogeneic stem cell transplant (uses someone else's stem cells) Autoimmune diseases, such as Crohn disease, ulcerative colitis, lupus History of chest radiation Nervous system problems, such as Guillain-Barre syndrome, myasthenia gravis Organ transplant An unusual or allergic reaction to durvalumab, other medications, foods, dyes, or preservatives Pregnant or trying to get pregnant Breast-feeding How should I use this medication? This medication is infused into a vein. It is  given by your care team in a hospital or clinic setting. A special MedGuide will be given to you before each treatment. Be sure to read this information carefully each time. Talk to your care team about the use of this medication in children. Special care may be needed. Overdosage: If you think you have taken too much of this medicine contact a poison control center or emergency room at once. NOTE: This medicine is only for you. Do not share this medicine with others. What if I miss a dose? Keep appointments for follow-up doses. It is important not to miss your dose. Call your care team if you are unable to keep an appointment. What may interact with this medication? Interactions have not been studied. This list may not describe all possible interactions. Give your health care provider a list of all the medicines, herbs, non-prescription drugs, or dietary supplements you use. Also tell them if you smoke, drink alcohol, or use illegal drugs. Some items may interact with your medicine. What should I watch for while using this medication? Your condition will be monitored carefully while you are receiving this medication. You may need blood work while taking this medication. This medication may cause serious skin reactions. They can happen weeks to months after starting the medication. Contact your care team right away if you notice fevers or flu-like symptoms with a rash. The rash may be red or purple and then turn into blisters or peeling of the skin. You may also notice a red rash with swelling of the face, lips, or lymph nodes in your neck or under your arms. Tell your care team right away  if you have any change in your eyesight. Talk to your care team if you may be pregnant. Serious birth defects can occur if you take this medication during pregnancy and for 3 months after the last dose. You will need a negative pregnancy test before starting this medication. Contraception is recommended while taking  this medication and for 3 months after the last dose. Your care team can help you find the option that works for you. Do not breastfeed while taking this medication and for 3 months after the last dose. What side effects may I notice from receiving this medication? Side effects that you should report to your care team as soon as possible: Allergic reactions--skin rash, itching, hives, swelling of the face, lips, tongue, or throat Dry cough, shortness of breath or trouble breathing Eye pain, redness, irritation, or discharge with blurry or decreased vision Heart muscle inflammation--unusual weakness or fatigue, shortness of breath, chest pain, fast or irregular heartbeat, dizziness, swelling of the ankles, feet, or hands Hormone gland problems--headache, sensitivity to light, unusual weakness or fatigue, dizziness, fast or irregular heartbeat, increased sensitivity to cold or heat, excessive sweating, constipation, hair loss, increased thirst or amount of urine, tremors or shaking, irritability Infusion reactions--chest pain, shortness of breath or trouble breathing, feeling faint or lightheaded Kidney injury (glomerulonephritis)--decrease in the amount of urine, red or dark Tae Robak urine, foamy or bubbly urine, swelling of the ankles, hands, or feet Liver injury--right upper belly pain, loss of appetite, nausea, light-colored stool, dark yellow or Laraya Pestka urine, yellowing skin or eyes, unusual weakness or fatigue Pain, tingling, or numbness in the hands or feet, muscle weakness, change in vision, confusion or trouble speaking, loss of balance or coordination, trouble walking, seizures Rash, fever, and swollen lymph nodes Redness, blistering, peeling, or loosening of the skin, including inside the mouth Sudden or severe stomach pain, bloody diarrhea, fever, nausea, vomiting Side effects that usually do not require medical attention (report these to your care team if they continue or are  bothersome): Bone, joint, or muscle pain Diarrhea Fatigue Loss of appetite Nausea Skin rash This list may not describe all possible side effects. Call your doctor for medical advice about side effects. You may report side effects to FDA at 1-800-FDA-1088. Where should I keep my medication? This medication is given in a hospital or clinic. It will not be stored at home. NOTE: This sheet is a summary. It may not cover all possible information. If you have questions about this medicine, talk to your doctor, pharmacist, or health care provider.  2023 Elsevier/Gold Standard (2020-12-30 00:00:00)        To help prevent nausea and vomiting after your treatment, we encourage you to take your nausea medication as directed.  BELOW ARE SYMPTOMS THAT SHOULD BE REPORTED IMMEDIATELY: *FEVER GREATER THAN 100.4 F (38 C) OR HIGHER *CHILLS OR SWEATING *NAUSEA AND VOMITING THAT IS NOT CONTROLLED WITH YOUR NAUSEA MEDICATION *UNUSUAL SHORTNESS OF BREATH *UNUSUAL BRUISING OR BLEEDING *URINARY PROBLEMS (pain or burning when urinating, or frequent urination) *BOWEL PROBLEMS (unusual diarrhea, constipation, pain near the anus) TENDERNESS IN MOUTH AND THROAT WITH OR WITHOUT PRESENCE OF ULCERS (sore throat, sores in mouth, or a toothache) UNUSUAL RASH, SWELLING OR PAIN  UNUSUAL VAGINAL DISCHARGE OR ITCHING   Items with * indicate a potential emergency and should be followed up as soon as possible or go to the Emergency Department if any problems should occur.  Please show the CHEMOTHERAPY ALERT CARD or IMMUNOTHERAPY ALERT CARD at check-in to  the Emergency Department and triage nurse.  Should you have questions after your visit or need to cancel or reschedule your appointment, please contact [facility] [phone]  and follow the prompts.  Office hours are 8:00 a.m. to 4:30 p.m. Monday - Friday. Please note that voicemails left after 4:00 p.m. may not be returned until the following  business day.  We are closed weekends and major holidays. You have access to a nurse at all times for urgent questions. Please call the main number to the clinic [phone] and follow the prompts.  For any non-urgent questions, you may also contact your provider using MyChart. We now offer e-Visits for anyone 26 and older to request care online for non-urgent symptoms. For details visit [email or web address].   Also download the MyChart app! Go to the app store, search "MyChart", open the app, select [facility], and log in with your MyChart username and password.  Masks are optional in the cancer centers. If you would like for your care team to wear a mask while they are taking care of you, please let them know. You may have one support person who is at least 69 years old accompany you for your appointments.

## 2022-04-20 NOTE — Progress Notes (Signed)
Ok to treat with bilirubin of 1.7  T.O. Dr Rhys Martini, PharmD

## 2022-04-26 ENCOUNTER — Inpatient Hospital Stay: Payer: Medicare HMO | Admitting: Hematology

## 2022-04-26 ENCOUNTER — Inpatient Hospital Stay: Payer: Medicare HMO

## 2022-04-26 ENCOUNTER — Other Ambulatory Visit: Payer: Self-pay

## 2022-04-27 ENCOUNTER — Other Ambulatory Visit: Payer: Self-pay

## 2022-05-03 ENCOUNTER — Other Ambulatory Visit: Payer: Self-pay

## 2022-05-04 ENCOUNTER — Other Ambulatory Visit: Payer: Self-pay

## 2022-05-05 ENCOUNTER — Inpatient Hospital Stay: Payer: Medicare HMO | Admitting: Hematology

## 2022-05-05 ENCOUNTER — Inpatient Hospital Stay: Payer: Medicare HMO | Attending: Hematology and Oncology

## 2022-05-05 ENCOUNTER — Inpatient Hospital Stay: Payer: Medicare HMO

## 2022-05-05 VITALS — BP 105/73 | HR 73 | Temp 98.0°F | Resp 18

## 2022-05-05 DIAGNOSIS — K5900 Constipation, unspecified: Secondary | ICD-10-CM | POA: Diagnosis not present

## 2022-05-05 DIAGNOSIS — C3432 Malignant neoplasm of lower lobe, left bronchus or lung: Secondary | ICD-10-CM | POA: Diagnosis not present

## 2022-05-05 DIAGNOSIS — E876 Hypokalemia: Secondary | ICD-10-CM | POA: Diagnosis not present

## 2022-05-05 DIAGNOSIS — Z809 Family history of malignant neoplasm, unspecified: Secondary | ICD-10-CM | POA: Insufficient documentation

## 2022-05-05 DIAGNOSIS — E669 Obesity, unspecified: Secondary | ICD-10-CM | POA: Diagnosis not present

## 2022-05-05 DIAGNOSIS — Z8051 Family history of malignant neoplasm of kidney: Secondary | ICD-10-CM | POA: Insufficient documentation

## 2022-05-05 DIAGNOSIS — R079 Chest pain, unspecified: Secondary | ICD-10-CM | POA: Insufficient documentation

## 2022-05-05 DIAGNOSIS — Z8249 Family history of ischemic heart disease and other diseases of the circulatory system: Secondary | ICD-10-CM | POA: Diagnosis not present

## 2022-05-05 DIAGNOSIS — Z825 Family history of asthma and other chronic lower respiratory diseases: Secondary | ICD-10-CM | POA: Diagnosis not present

## 2022-05-05 DIAGNOSIS — Z170 Estrogen receptor positive status [ER+]: Secondary | ICD-10-CM

## 2022-05-05 DIAGNOSIS — Z833 Family history of diabetes mellitus: Secondary | ICD-10-CM | POA: Insufficient documentation

## 2022-05-05 DIAGNOSIS — Z5112 Encounter for antineoplastic immunotherapy: Secondary | ICD-10-CM | POA: Insufficient documentation

## 2022-05-05 DIAGNOSIS — R070 Pain in throat: Secondary | ICD-10-CM | POA: Insufficient documentation

## 2022-05-05 DIAGNOSIS — Z79899 Other long term (current) drug therapy: Secondary | ICD-10-CM | POA: Insufficient documentation

## 2022-05-05 DIAGNOSIS — Z836 Family history of other diseases of the respiratory system: Secondary | ICD-10-CM | POA: Insufficient documentation

## 2022-05-05 DIAGNOSIS — C321 Malignant neoplasm of supraglottis: Secondary | ICD-10-CM | POA: Diagnosis not present

## 2022-05-05 DIAGNOSIS — C3490 Malignant neoplasm of unspecified part of unspecified bronchus or lung: Secondary | ICD-10-CM

## 2022-05-05 DIAGNOSIS — Z800 Family history of malignant neoplasm of digestive organs: Secondary | ICD-10-CM | POA: Diagnosis not present

## 2022-05-05 DIAGNOSIS — Z87891 Personal history of nicotine dependence: Secondary | ICD-10-CM | POA: Insufficient documentation

## 2022-05-05 DIAGNOSIS — Z818 Family history of other mental and behavioral disorders: Secondary | ICD-10-CM | POA: Insufficient documentation

## 2022-05-05 DIAGNOSIS — Z8041 Family history of malignant neoplasm of ovary: Secondary | ICD-10-CM | POA: Diagnosis not present

## 2022-05-05 LAB — CBC WITH DIFFERENTIAL/PLATELET
Abs Immature Granulocytes: 0.02 10*3/uL (ref 0.00–0.07)
Basophils Absolute: 0 10*3/uL (ref 0.0–0.1)
Basophils Relative: 0 %
Eosinophils Absolute: 0.1 10*3/uL (ref 0.0–0.5)
Eosinophils Relative: 2 %
HCT: 40.5 % (ref 36.0–46.0)
Hemoglobin: 13.8 g/dL (ref 12.0–15.0)
Immature Granulocytes: 0 %
Lymphocytes Relative: 10 %
Lymphs Abs: 0.7 10*3/uL (ref 0.7–4.0)
MCH: 30.9 pg (ref 26.0–34.0)
MCHC: 34.1 g/dL (ref 30.0–36.0)
MCV: 90.6 fL (ref 80.0–100.0)
Monocytes Absolute: 0.4 10*3/uL (ref 0.1–1.0)
Monocytes Relative: 5 %
Neutro Abs: 6 10*3/uL (ref 1.7–7.7)
Neutrophils Relative %: 83 %
Platelets: 190 10*3/uL (ref 150–400)
RBC: 4.47 MIL/uL (ref 3.87–5.11)
RDW: 13.1 % (ref 11.5–15.5)
WBC: 7.3 10*3/uL (ref 4.0–10.5)
nRBC: 0 % (ref 0.0–0.2)

## 2022-05-05 LAB — COMPREHENSIVE METABOLIC PANEL
ALT: 9 U/L (ref 0–44)
AST: 14 U/L — ABNORMAL LOW (ref 15–41)
Albumin: 3.5 g/dL (ref 3.5–5.0)
Alkaline Phosphatase: 63 U/L (ref 38–126)
Anion gap: 6 (ref 5–15)
BUN: 16 mg/dL (ref 8–23)
CO2: 26 mmol/L (ref 22–32)
Calcium: 9.1 mg/dL (ref 8.9–10.3)
Chloride: 105 mmol/L (ref 98–111)
Creatinine, Ser: 0.85 mg/dL (ref 0.44–1.00)
GFR, Estimated: >60 mL/min (ref >60)
Glucose, Bld: 114 mg/dL — ABNORMAL HIGH (ref 70–99)
Potassium: 3.7 mmol/L (ref 3.5–5.1)
Sodium: 137 mmol/L (ref 135–145)
Total Bilirubin: 1.6 mg/dL — ABNORMAL HIGH (ref 0.3–1.2)
Total Protein: 7.1 g/dL (ref 6.5–8.1)

## 2022-05-05 LAB — MAGNESIUM: Magnesium: 2 mg/dL (ref 1.7–2.4)

## 2022-05-05 LAB — TSH: TSH: 7.762 u[IU]/mL — ABNORMAL HIGH (ref 0.350–4.500)

## 2022-05-05 MED ORDER — SODIUM CHLORIDE 0.9% FLUSH
10.0000 mL | INTRAVENOUS | Status: DC | PRN
  Administered 2022-05-05: 10 mL

## 2022-05-05 MED ORDER — SODIUM CHLORIDE 0.9 % IV SOLN: Freq: Once | INTRAVENOUS | Status: AC

## 2022-05-05 MED ORDER — HEPARIN SOD (PORK) LOCK FLUSH 100 UNIT/ML IV SOLN
500.0000 [IU] | Freq: Once | INTRAVENOUS | Status: AC | PRN
  Administered 2022-05-05: 500 [IU]

## 2022-05-05 MED ORDER — SODIUM CHLORIDE 0.9 % IV SOLN
10.0000 mg/kg | Freq: Once | INTRAVENOUS | Status: AC
  Administered 2022-05-05: 860 mg via INTRAVENOUS
  Filled 2022-05-05: qty 7.2

## 2022-05-05 NOTE — Progress Notes (Signed)
Patient presents today for Imfinzi, Total Bilirubin 1.6, patient okay for treatment per Dr. Delton Coombes.  Patient tolerated therapy with no complaints voiced. Side effects with management reviewed with understanding verbalized. Port site clean and dry with no bruising or swelling noted at site. Good blood return noted before and after administration of therapy. Band aid applied. Patient left in satisfactory condition with VSS and no s/s of distress noted.

## 2022-05-05 NOTE — Patient Instructions (Signed)
[facility]  Discharge Instructions: Thank you for choosing [facility] to provide your oncology and hematology care.  If you have a lab appointment with the [facility], please come in thru the Main Entrance and check in at the main information desk.  Wear comfortable clothing and clothing appropriate for easy access to any Portacath or PICC line.   We strive to give you quality time with your provider. You may need to reschedule your appointment if you arrive late (15 or more minutes).  Arriving late affects you and other patients whose appointments are after yours.  Also, if you miss three or more appointments without notifying the office, you may be dismissed from the clinic at the provider's discretion.      For prescription refill requests, have your pharmacy contact our office and allow 72 hours for refills to be completed.    Today you received the following Imfinzi, return as scheduled.   To help prevent nausea and vomiting after your treatment, we encourage you to take your nausea medication as directed.  BELOW ARE SYMPTOMS THAT SHOULD BE REPORTED IMMEDIATELY: *FEVER GREATER THAN 100.4 F (38 C) OR HIGHER *CHILLS OR SWEATING *NAUSEA AND VOMITING THAT IS NOT CONTROLLED WITH YOUR NAUSEA MEDICATION *UNUSUAL SHORTNESS OF BREATH *UNUSUAL BRUISING OR BLEEDING *URINARY PROBLEMS (pain or burning when urinating, or frequent urination) *BOWEL PROBLEMS (unusual diarrhea, constipation, pain near the anus) TENDERNESS IN MOUTH AND THROAT WITH OR WITHOUT PRESENCE OF ULCERS (sore throat, sores in mouth, or a toothache) UNUSUAL RASH, SWELLING OR PAIN  UNUSUAL VAGINAL DISCHARGE OR ITCHING   Items with * indicate a potential emergency and should be followed up as soon as possible or go to the Emergency Department if any problems should occur.  Please show the CHEMOTHERAPY ALERT CARD or IMMUNOTHERAPY ALERT CARD at check-in to the Emergency Department and triage  nurse.  Should you have questions after your visit or need to cancel or reschedule your appointment, please contact [facility] [phone]  and follow the prompts.  Office hours are 8:00 a.m. to 4:30 p.m. Monday - Friday. Please note that voicemails left after 4:00 p.m. may not be returned until the following business day.  We are closed weekends and major holidays. You have access to a nurse at all times for urgent questions. Please call the main number to the clinic [phone] and follow the prompts.  For any non-urgent questions, you may also contact your provider using MyChart. We now offer e-Visits for anyone 35 and older to request care online for non-urgent symptoms. For details visit [email or web address].   Also download the MyChart app! Go to the app store, search "MyChart", open the app, select [facility], and log in with your MyChart username and password.  Masks are optional in the cancer centers. If you would like for your care team to wear a mask while they are taking care of you, please let them know. You may have one support person who is at least 69 years old accompany you for your appointments.

## 2022-05-10 ENCOUNTER — Inpatient Hospital Stay: Payer: Medicare HMO

## 2022-05-18 ENCOUNTER — Other Ambulatory Visit: Payer: Self-pay

## 2022-05-19 ENCOUNTER — Inpatient Hospital Stay: Payer: Medicare HMO

## 2022-05-19 VITALS — BP 129/80 | HR 95 | Temp 97.9°F | Resp 18 | Wt 180.6 lb

## 2022-05-19 DIAGNOSIS — Z8051 Family history of malignant neoplasm of kidney: Secondary | ICD-10-CM | POA: Diagnosis not present

## 2022-05-19 DIAGNOSIS — C3432 Malignant neoplasm of lower lobe, left bronchus or lung: Secondary | ICD-10-CM | POA: Diagnosis not present

## 2022-05-19 DIAGNOSIS — E669 Obesity, unspecified: Secondary | ICD-10-CM | POA: Diagnosis not present

## 2022-05-19 DIAGNOSIS — Z818 Family history of other mental and behavioral disorders: Secondary | ICD-10-CM | POA: Diagnosis not present

## 2022-05-19 DIAGNOSIS — C321 Malignant neoplasm of supraglottis: Secondary | ICD-10-CM | POA: Diagnosis not present

## 2022-05-19 DIAGNOSIS — C3490 Malignant neoplasm of unspecified part of unspecified bronchus or lung: Secondary | ICD-10-CM

## 2022-05-19 DIAGNOSIS — Z79899 Other long term (current) drug therapy: Secondary | ICD-10-CM | POA: Diagnosis not present

## 2022-05-19 DIAGNOSIS — Z836 Family history of other diseases of the respiratory system: Secondary | ICD-10-CM | POA: Diagnosis not present

## 2022-05-19 DIAGNOSIS — K5900 Constipation, unspecified: Secondary | ICD-10-CM | POA: Diagnosis not present

## 2022-05-19 DIAGNOSIS — Z8249 Family history of ischemic heart disease and other diseases of the circulatory system: Secondary | ICD-10-CM | POA: Diagnosis not present

## 2022-05-19 DIAGNOSIS — Z8041 Family history of malignant neoplasm of ovary: Secondary | ICD-10-CM | POA: Diagnosis not present

## 2022-05-19 DIAGNOSIS — E876 Hypokalemia: Secondary | ICD-10-CM | POA: Diagnosis not present

## 2022-05-19 DIAGNOSIS — Z5112 Encounter for antineoplastic immunotherapy: Secondary | ICD-10-CM | POA: Diagnosis not present

## 2022-05-19 DIAGNOSIS — Z809 Family history of malignant neoplasm, unspecified: Secondary | ICD-10-CM | POA: Diagnosis not present

## 2022-05-19 DIAGNOSIS — Z825 Family history of asthma and other chronic lower respiratory diseases: Secondary | ICD-10-CM | POA: Diagnosis not present

## 2022-05-19 DIAGNOSIS — Z800 Family history of malignant neoplasm of digestive organs: Secondary | ICD-10-CM | POA: Diagnosis not present

## 2022-05-19 DIAGNOSIS — Z833 Family history of diabetes mellitus: Secondary | ICD-10-CM | POA: Diagnosis not present

## 2022-05-19 DIAGNOSIS — Z87891 Personal history of nicotine dependence: Secondary | ICD-10-CM | POA: Diagnosis not present

## 2022-05-19 DIAGNOSIS — R070 Pain in throat: Secondary | ICD-10-CM | POA: Diagnosis not present

## 2022-05-19 DIAGNOSIS — R079 Chest pain, unspecified: Secondary | ICD-10-CM | POA: Diagnosis not present

## 2022-05-19 DIAGNOSIS — Z170 Estrogen receptor positive status [ER+]: Secondary | ICD-10-CM

## 2022-05-19 LAB — CBC WITH DIFFERENTIAL/PLATELET
Abs Immature Granulocytes: 0.02 10*3/uL (ref 0.00–0.07)
Basophils Absolute: 0 10*3/uL (ref 0.0–0.1)
Basophils Relative: 1 %
Eosinophils Absolute: 0.1 10*3/uL (ref 0.0–0.5)
Eosinophils Relative: 2 %
HCT: 40.8 % (ref 36.0–46.0)
Hemoglobin: 13.8 g/dL (ref 12.0–15.0)
Immature Granulocytes: 0 %
Lymphocytes Relative: 13 %
Lymphs Abs: 0.8 10*3/uL (ref 0.7–4.0)
MCH: 30.2 pg (ref 26.0–34.0)
MCHC: 33.8 g/dL (ref 30.0–36.0)
MCV: 89.3 fL (ref 80.0–100.0)
Monocytes Absolute: 0.3 10*3/uL (ref 0.1–1.0)
Monocytes Relative: 6 %
Neutro Abs: 4.6 10*3/uL (ref 1.7–7.7)
Neutrophils Relative %: 78 %
Platelets: 191 10*3/uL (ref 150–400)
RBC: 4.57 MIL/uL (ref 3.87–5.11)
RDW: 12.6 % (ref 11.5–15.5)
WBC: 5.9 10*3/uL (ref 4.0–10.5)
nRBC: 0 % (ref 0.0–0.2)

## 2022-05-19 LAB — COMPREHENSIVE METABOLIC PANEL
ALT: 7 U/L (ref 0–44)
AST: 13 U/L — ABNORMAL LOW (ref 15–41)
Albumin: 3.5 g/dL (ref 3.5–5.0)
Alkaline Phosphatase: 61 U/L (ref 38–126)
Anion gap: 7 (ref 5–15)
BUN: 14 mg/dL (ref 8–23)
CO2: 28 mmol/L (ref 22–32)
Calcium: 9.4 mg/dL (ref 8.9–10.3)
Chloride: 103 mmol/L (ref 98–111)
Creatinine, Ser: 0.89 mg/dL (ref 0.44–1.00)
GFR, Estimated: >60 mL/min (ref >60)
Glucose, Bld: 94 mg/dL (ref 70–99)
Potassium: 3.5 mmol/L (ref 3.5–5.1)
Sodium: 138 mmol/L (ref 135–145)
Total Bilirubin: 1.7 mg/dL — ABNORMAL HIGH (ref 0.3–1.2)
Total Protein: 7.1 g/dL (ref 6.5–8.1)

## 2022-05-19 LAB — TSH: TSH: 7.868 u[IU]/mL — ABNORMAL HIGH (ref 0.350–4.500)

## 2022-05-19 LAB — MAGNESIUM: Magnesium: 2.3 mg/dL (ref 1.7–2.4)

## 2022-05-19 MED ORDER — SODIUM CHLORIDE 0.9 % IV SOLN: Freq: Once | INTRAVENOUS | Status: AC

## 2022-05-19 MED ORDER — SODIUM CHLORIDE 0.9 % IV SOLN
10.0000 mg/kg | Freq: Once | INTRAVENOUS | Status: AC
  Administered 2022-05-19: 860 mg via INTRAVENOUS
  Filled 2022-05-19: qty 10

## 2022-05-19 MED ORDER — HEPARIN SOD (PORK) LOCK FLUSH 100 UNIT/ML IV SOLN
500.0000 [IU] | Freq: Once | INTRAVENOUS | Status: AC | PRN
  Administered 2022-05-19: 500 [IU]

## 2022-05-19 MED ORDER — SODIUM CHLORIDE 0.9% FLUSH
10.0000 mL | INTRAVENOUS | Status: DC | PRN
  Administered 2022-05-19: 10 mL

## 2022-05-19 NOTE — Progress Notes (Signed)
Patients port flushed without difficulty.  Good blood return noted with no bruising or swelling noted at site.  Stable during access and blood draw.  Patient to remain accessed for treatment. 

## 2022-05-19 NOTE — Patient Instructions (Signed)
[facility]  Discharge Instructions: Thank you for choosing [facility] to provide your oncology and hematology care.  If you have a lab appointment with the [facility], please come in thru the Main Entrance and check in at the main information desk.  Wear comfortable clothing and clothing appropriate for easy access to any Portacath or PICC line.   We strive to give you quality time with your provider. You may need to reschedule your appointment if you arrive late (15 or more minutes).  Arriving late affects you and other patients whose appointments are after yours.  Also, if you miss three or more appointments without notifying the office, you may be dismissed from the clinic at the provider's discretion.      For prescription refill requests, have your pharmacy contact our office and allow 72 hours for refills to be completed.    Today you received the following chemotherapy and/or immunotherapy agents Imfinzi      To help prevent nausea and vomiting after your treatment, we encourage you to take your nausea medication as directed.  BELOW ARE SYMPTOMS THAT SHOULD BE REPORTED IMMEDIATELY: *FEVER GREATER THAN 100.4 F (38 C) OR HIGHER *CHILLS OR SWEATING *NAUSEA AND VOMITING THAT IS NOT CONTROLLED WITH YOUR NAUSEA MEDICATION *UNUSUAL SHORTNESS OF BREATH *UNUSUAL BRUISING OR BLEEDING *URINARY PROBLEMS (pain or burning when urinating, or frequent urination) *BOWEL PROBLEMS (unusual diarrhea, constipation, pain near the anus) TENDERNESS IN MOUTH AND THROAT WITH OR WITHOUT PRESENCE OF ULCERS (sore throat, sores in mouth, or a toothache) UNUSUAL RASH, SWELLING OR PAIN  UNUSUAL VAGINAL DISCHARGE OR ITCHING   Items with * indicate a potential emergency and should be followed up as soon as possible or go to the Emergency Department if any problems should occur.  Please show the CHEMOTHERAPY ALERT CARD or IMMUNOTHERAPY ALERT CARD at check-in to the Emergency  Department and triage nurse.  Should you have questions after your visit or need to cancel or reschedule your appointment, please contact [facility] [phone]  and follow the prompts.  Office hours are 8:00 a.m. to 4:30 p.m. Monday - Friday. Please note that voicemails left after 4:00 p.m. may not be returned until the following business day.  We are closed weekends and major holidays. You have access to a nurse at all times for urgent questions. Please call the main number to the clinic [phone] and follow the prompts.  For any non-urgent questions, you may also contact your provider using MyChart. We now offer e-Visits for anyone 41 and older to request care online for non-urgent symptoms. For details visit [email or web address].   Also download the MyChart app! Go to the app store, search "MyChart", open the app, select [facility], and log in with your MyChart username and password.  Masks are optional in the cancer centers. If you would like for your care team to wear a mask while they are taking care of you, please let them know. You may have one support person who is at least 69 years old accompany you for your appointments.

## 2022-05-19 NOTE — Progress Notes (Signed)
Patient presents today for Imfinzi infusion per providers order.  Vital signs within parameters for treatment.  Labs pending.  Patient has no new complaints at this time.  Total bilirubin noted to be 1.7, MD notified.  Message received from Dr. Delton Coombes,  okay to proceed with treatment.    Treatment given today per MD orders.  Stable during infusion without adverse affects.  Vital signs stable.  No complaints at this time.  Discharge from clinic ambulatory in stable condition.  Alert and oriented X 3.  Follow up with [facility] as scheduled.

## 2022-05-20 ENCOUNTER — Ambulatory Visit (HOSPITAL_COMMUNITY)
Admission: RE | Admit: 2022-05-20 | Discharge: 2022-05-20 | Disposition: A | Discharge status: Home / Self Care | Payer: Medicare HMO | Source: Ambulatory Visit | Attending: Hematology | Admitting: Hematology

## 2022-05-20 DIAGNOSIS — C3490 Malignant neoplasm of unspecified part of unspecified bronchus or lung: Secondary | ICD-10-CM | POA: Diagnosis not present

## 2022-05-20 DIAGNOSIS — R911 Solitary pulmonary nodule: Secondary | ICD-10-CM | POA: Diagnosis not present

## 2022-05-20 DIAGNOSIS — J439 Emphysema, unspecified: Secondary | ICD-10-CM | POA: Diagnosis not present

## 2022-05-20 MED ORDER — IOHEXOL 300 MG/ML  SOLN
75.0000 mL | Freq: Once | INTRAMUSCULAR | Status: AC | PRN
  Administered 2022-05-20: 75 mL via INTRAVENOUS

## 2022-05-24 ENCOUNTER — Inpatient Hospital Stay: Payer: Medicare HMO

## 2022-05-24 ENCOUNTER — Inpatient Hospital Stay: Payer: Medicare HMO | Admitting: Hematology

## 2022-05-25 ENCOUNTER — Inpatient Hospital Stay: Payer: Medicare HMO | Attending: Hematology and Oncology | Admitting: Hematology

## 2022-05-25 DIAGNOSIS — Z8719 Personal history of other diseases of the digestive system: Secondary | ICD-10-CM | POA: Insufficient documentation

## 2022-05-25 DIAGNOSIS — Z8041 Family history of malignant neoplasm of ovary: Secondary | ICD-10-CM | POA: Insufficient documentation

## 2022-05-25 DIAGNOSIS — C321 Malignant neoplasm of supraglottis: Secondary | ICD-10-CM | POA: Insufficient documentation

## 2022-05-25 DIAGNOSIS — E876 Hypokalemia: Secondary | ICD-10-CM | POA: Diagnosis not present

## 2022-05-25 DIAGNOSIS — J439 Emphysema, unspecified: Secondary | ICD-10-CM | POA: Diagnosis not present

## 2022-05-25 DIAGNOSIS — Z808 Family history of malignant neoplasm of other organs or systems: Secondary | ICD-10-CM | POA: Insufficient documentation

## 2022-05-25 DIAGNOSIS — Z79899 Other long term (current) drug therapy: Secondary | ICD-10-CM | POA: Insufficient documentation

## 2022-05-25 DIAGNOSIS — R0602 Shortness of breath: Secondary | ICD-10-CM | POA: Diagnosis not present

## 2022-05-25 DIAGNOSIS — R197 Diarrhea, unspecified: Secondary | ICD-10-CM | POA: Diagnosis not present

## 2022-05-25 DIAGNOSIS — J029 Acute pharyngitis, unspecified: Secondary | ICD-10-CM | POA: Insufficient documentation

## 2022-05-25 DIAGNOSIS — Z170 Estrogen receptor positive status [ER+]: Secondary | ICD-10-CM | POA: Insufficient documentation

## 2022-05-25 DIAGNOSIS — C3432 Malignant neoplasm of lower lobe, left bronchus or lung: Secondary | ICD-10-CM | POA: Insufficient documentation

## 2022-05-25 DIAGNOSIS — Z800 Family history of malignant neoplasm of digestive organs: Secondary | ICD-10-CM | POA: Diagnosis not present

## 2022-05-25 DIAGNOSIS — Z923 Personal history of irradiation: Secondary | ICD-10-CM | POA: Insufficient documentation

## 2022-05-25 DIAGNOSIS — C50412 Malignant neoplasm of upper-outer quadrant of left female breast: Secondary | ICD-10-CM | POA: Diagnosis not present

## 2022-05-25 DIAGNOSIS — Z5112 Encounter for antineoplastic immunotherapy: Secondary | ICD-10-CM | POA: Diagnosis not present

## 2022-05-25 DIAGNOSIS — C3490 Malignant neoplasm of unspecified part of unspecified bronchus or lung: Secondary | ICD-10-CM | POA: Diagnosis not present

## 2022-05-25 DIAGNOSIS — D696 Thrombocytopenia, unspecified: Secondary | ICD-10-CM | POA: Insufficient documentation

## 2022-05-25 DIAGNOSIS — Z801 Family history of malignant neoplasm of trachea, bronchus and lung: Secondary | ICD-10-CM | POA: Insufficient documentation

## 2022-05-25 DIAGNOSIS — M542 Cervicalgia: Secondary | ICD-10-CM | POA: Insufficient documentation

## 2022-05-25 DIAGNOSIS — K5900 Constipation, unspecified: Secondary | ICD-10-CM | POA: Diagnosis not present

## 2022-05-25 DIAGNOSIS — I3139 Other pericardial effusion (noninflammatory): Secondary | ICD-10-CM | POA: Insufficient documentation

## 2022-05-25 DIAGNOSIS — Z79811 Long term (current) use of aromatase inhibitors: Secondary | ICD-10-CM | POA: Diagnosis not present

## 2022-05-25 DIAGNOSIS — Z9049 Acquired absence of other specified parts of digestive tract: Secondary | ICD-10-CM | POA: Diagnosis not present

## 2022-05-25 DIAGNOSIS — Z87891 Personal history of nicotine dependence: Secondary | ICD-10-CM | POA: Insufficient documentation

## 2022-05-25 NOTE — Patient Instructions (Addendum)
[place] at [facility] Discharge Instructions   You were seen and examined today by Dr. Delton Coombes.  He reviewed the results of your CT scan which shows an excellent response to treatment.   He reviewed the results of your lab work from 10/26 which are all normal with the exception of your thyroid levels. They are slightly elevated at 7.8.  We will proceed with your treatment today.   Return as scheduled.    Thank you for choosing [place] at [facility] to provide your oncology and hematology care.  To afford each patient quality time with our provider, please arrive at least 15 minutes before your scheduled appointment time.   If you have a lab appointment with the [facility] please come in thru the Main Entrance and check in at the main information desk.  You need to re-schedule your appointment should you arrive 10 or more minutes late.  We strive to give you quality time with our providers, and arriving late affects you and other patients whose appointments are after yours.  Also, if you no show three or more times for appointments you may be dismissed from the clinic at the providers discretion.     Again, thank you for choosing [facility].  Our hope is that these requests will decrease the amount of time that you wait before being seen by our physicians.       _____________________________________________________________  Should you have questions after your visit to [facility], please contact our office at [phone] and follow the prompts.  Our office hours are 8:00 a.m. and 4:30 p.m. Monday - Friday.  Please note that voicemails left after 4:00 p.m. may not be returned until the following business day.  We are closed weekends and major holidays.  You do have access to a nurse 24-7, just call the main number to the clinic [phone] and do not press any options, hold on the line and a nurse will  answer the phone.    For prescription refill requests, have your pharmacy contact our office and allow 72 hours.    Due to Covid, you will need to wear a mask upon entering the hospital. If you do not have a mask, a mask will be given to you at the Main Entrance upon arrival. For doctor visits, patients may have 1 support person age 57 or older with them. For treatment visits, patients can not have anyone with them due to social distancing guidelines and our immunocompromised population.

## 2022-05-25 NOTE — Progress Notes (Signed)
Regina Mcmahon, [place] [address]   CLINIC:  Medical Oncology/Hematology  PCP:  Regina Squibb, MD [address] 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    06/24/2014 Breast US   Left breast: ill-defined predominantly hypoechoic area with some increased echogenicity in the 2 o'clock position of the left breast, 7 cm from the nipple. 2.6 x 2.3 x 1.8 cm in maximum dimensions.   07/01/2014 Initial Biopsy   Left breast core needle bx: Invasive mammary carcinoma with lobular features, ER+ (100%), PR+ (91%), HER2/neu negative (ratio 1.09), Ki-67 32%. E-cadherin strongly positive, diagnostic for IDC    07/01/2014 Clinical Stage   Stage IIA: T2 N0   08/20/2014 Definitive Surgery   Left breast seed localized lumpectomy/SLNB: IDC, +LVI, DCIS, 1 LN removed and positive for metastatic carcinoma. Grade 2. HER2/neu repeated and negative (ratio 0.69-1.9).    08/20/2014 Pathologic Stage   Stage IIB: pT2 pN1a pMx   09/17/2014 Surgery   Port-a cath placement and re-excision   10/02/2014 - 01/15/2015 Chemotherapy   CMF x 6 cycles.  patient refused Taxane-containing chemotherapy.   03/07/2015 Procedure   Comp Cancer Panel reveals no variant at APC, ATM, AXIN2, BARD1, BMPR1A, BRCA1, BRCA2, BRIP1, CDH1, CDK4, CDKN2A, CHEK2, FANCC, MLH1, MSH2, MSH6, MUTYH, NBN, PALB2, PMS2, POLD1, POLE, PTEN, RAD51C, RAD51D, SMAD4, STK11, TP53, VHL, and XRCC2.    04/06/2015 - 05/21/2015 Radiation Therapy    Adjuvant RT Pablo Ledger): Left breast/ 45 Gy over 25 fractions.   Left supraclavicular fossa and axilla/ 45 Gy over 25 fractions. Left breast boost/ 16 Gy over 8 fractions. Total dose: 61 Gy   06/04/2015 - 08/07/2015 Anti-estrogen oral therapy   Exemestane 25 mg daily.  Planned duration of therapy 5 years.    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  PET also demonstrated borderline right level 2 lymph node left supraclavicular, mediastinal and hilar lymphadenopathy and hypermetabolic left lower lobe pulmonary nodule all suspicious of metastatic focus.  No abdominal pelvic metastatic disease or osseous metastatic disease was appreciated.   08/31/2021 Initial Diagnosis   Squamous cell carcinoma of lung ([place])   09/08/2021 Cancer Staging   Staging form: Lung, AJCC 8th Edition -  Clinical stage from 09/08/2021: Stage IIIB (cT1b, cN3, cM0) - Signed by Eppie Gibson, MD on 09/08/2021 Stage prefix: Initial diagnosis   09/30/2021 Pathology Results   [facility] 360 Pathology PDL 1 29%   11/01/2021 - 11/23/2021 Chemotherapy   Patient is on Treatment Plan : LUNG Carboplatin / Paclitaxel + XRT q7d     02/01/2022 - 03/15/2022 Chemotherapy   Patient  is on Treatment Plan : LUNG Durvalumab q14d     02/01/2022 -  Chemotherapy   Patient is on Treatment Plan : LUNG Durvalumab (10) q14d       CANCER STAGING:  Cancer Staging  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 female, seen for follow-up and toxicity assessment prior to next cycle of immunotherapy for lung cancer.  Her pain in the neck is well controlled with hydrocodone liquid.  Energy levels are 100%.  Dyspnea on exertion is stable.  REVIEW OF SYSTEMS:  Review of Systems  HENT:   Positive for sore throat.   Respiratory:  Positive for shortness of breath (On exertion).   Gastrointestinal:  Positive for diarrhea (Only on the day of treatment).  All other systems reviewed and are negative.   PAST MEDICAL/SURGICAL HISTORY:  Past Medical History:  Diagnosis Date   Arthritis    Asthma    Asymptomatic varicose veins    Breast cancer ([place]) 06/2014   left   Cancer ([place])    Supra Glottis   Chronic airway obstruction ([facility])    Complication of anesthesia 2016   difficulty waking up, Oxygen dropped low.   COPD (chronic obstructive pulmonary disease) ([facility])    Depression    Dyspnea    GERD (gastroesophageal reflux disease)    Hemorrhage rectum    and anus.   Hypertension    Insomnia    Other symptoms involving digestive system(787.99)    Pain    Hx.pain in joint involving pelvic region and thigh; pain in limb    Palpitations    Panic attacks    Personal history of chemotherapy 2016   Personal history of radiation therapy 2016   Pneumonia    Pre-diabetes    Prediabetes    Sinusitis    Symptomatic menopausal or female climacteric states    Varicose veins of both lower extremities with pain    Past Surgical History:  Procedure Laterality Date   BREAST BIOPSY Left 10/2012   BREAST BIOPSY Left 07/01/2014   malignant   BREAST LUMPECTOMY Left 08/20/2014   BRONCHIAL BIOPSY  08/23/2021   Procedure: BRONCHIAL BIOPSIES;  Surgeon: Collene Gobble, MD;  Location: [facility] ENDOSCOPY;  Service: Pulmonary;;   BRONCHIAL BRUSHINGS  08/23/2021   Procedure: BRONCHIAL BRUSHINGS;  Surgeon: Collene Gobble, MD;  Location: [facility] ENDOSCOPY;  Service: Pulmonary;;   BRONCHIAL NEEDLE ASPIRATION BIOPSY  08/23/2021   Procedure: BRONCHIAL NEEDLE ASPIRATION BIOPSIES;  Surgeon: Collene Gobble, MD;  Location: [facility] ENDOSCOPY;  Service: Pulmonary;;   BRONCHIAL WASHINGS  08/23/2021   Procedure: BRONCHIAL WASHINGS;  Surgeon: Collene Gobble, MD;  Location[facility] ENDOSCOPY;  Service: Pulmonary;;   CESAREAN SECTION     CHOLECYSTECTOMY  1985   COLONOSCOPY  2002   Dr. Lindalou Hose: internal hemorrhoids, one small rectal polyp, adenomatous path    COLONOSCOPY N/A 11/23/2015   Procedure: COLONOSCOPY;  Surgeon: Danie Binder, MD;  Location: [facility] ENDO SUITE;  Service: Endoscopy;  Laterality: N/A;  1100 - moved to 10:45 - office to notify   ESOPHAGOGASTRODUODENOSCOPY N/A 11/23/2015   Procedure: ESOPHAGOGASTRODUODENOSCOPY (EGD);  Surgeon: Danie Binder, MD;  Location: [facility]O [facility];  Service: Endoscopy;  Laterality: N/A;   IR IMAGING GUIDED PORT INSERTION  11/08/2021   PORT-A-CATH REMOVAL  02/2015   PORTACATH PLACEMENT Right 09/17/2014   Procedure: INSERTION PORT-A-CATH WITH ULTRASOUND;  Surgeon: Erroll Luna, MD;  Location: [facility];  Service: General;  Laterality: Right;  IJ   RADIOACTIVE SEED GUIDED PARTIAL MASTECTOMY WITH AXILLARY  SENTINEL LYMPH NODE BIOPSY Left 08/20/2014   Procedure: LEFT BREAST LUMPECTOMY WITH RADIOACTIVE SEED LOCALIZATION AND SENTINEL LYMPH NODE MAPPING;  Surgeon: Erroll Luna, MD;  Location: [facility];  Service: General;  Laterality: Left;   RE-EXCISION OF BREAST LUMPECTOMY Left 09/17/2014   Procedure: RE-EXCISION OF BREAST LUMPECTOMY;  Surgeon: Erroll Luna, MD;  Location: [facility];  Service: General;  Laterality: Left;   TOTAL HIP ARTHROPLASTY Right 02/22/2021   Procedure: RIGHT TOTAL HIP ARTHROPLASTY ANTERIOR APPROACH;  Surgeon: Marybelle Killings, MD;  Location: [facility];  Service: Orthopedics;  Laterality: Right;   TUBAL LIGATION     VIDEO BRONCHOSCOPY WITH ENDOBRONCHIAL ULTRASOUND N/A 08/23/2021   Procedure: VIDEO BRONCHOSCOPY WITH ENDOBRONCHIAL ULTRASOUND;  Surgeon: Collene Gobble, MD;  Location: [facility] ENDOSCOPY;  Service: Pulmonary;  Laterality: N/A;   VIDEO BRONCHOSCOPY WITH RADIAL ENDOBRONCHIAL ULTRASOUND  08/23/2021   Procedure: VIDEO BRONCHOSCOPY WITH RADIAL ENDOBRONCHIAL ULTRASOUND;  Surgeon: Collene Gobble, MD;  Location: [facility] ENDOSCOPY;  Service: Pulmonary;;    SOCIAL HISTORY:  Social History   Socioeconomic History   Marital status: Divorced    Spouse name: Not on file   Number of children: 2   Years of education: Not on file   Highest education level: Not on file  Occupational History   Not on file  Tobacco Use   Smoking status: Former    Packs/day: 1.00    Years: 42.00    Total pack years: 42.00    Types: Cigarettes    Quit date: 02/22/2021    Years since quitting: 1.2   Smokeless tobacco: Never  Vaping Use   Vaping Use: Never used  Substance and Sexual Activity   Alcohol use: No    Alcohol/week: 0.0 standard drinks of alcohol   Drug use: No   Sexual activity: Never    Birth control/protection: None  Other Topics Concern   Not on file  Social History Narrative   Not on file   Social Determinants of Health   Financial Resource Strain: Not  on file  Food Insecurity: Not on file  Transportation Needs: Not on file  Physical Activity: Not on file  Stress: Not on file  Social Connections: Unknown (08/05/2021)   Social Connection and Isolation Panel [NHANES]    Frequency of Communication with Friends and Family: More than three times a week    Frequency of Social Gatherings with Friends and Family: More than three times a week    Attends Religious Services: Not on file  Active Member of Clubs or Organizations: Not on file    Attends Archivist Meetings: Not on file    Marital Status: Not on file  Intimate Partner Violence: Not on file    FAMILY HISTORY:  Family History  Problem Relation Age of Onset   Diabetes Mother    Ovarian cancer Mother 28   Other Father        died in MVA at age 63   Other Sister        mitral valve disorder   Melanoma Sister 48       removed from back of leg   Colon cancer Brother        early 67s, succumbed to disease   Cancer Paternal Aunt        leukemia, bone, or lung cancer   Alzheimer's disease Maternal Grandmother    Heart attack Maternal Grandfather    Heart attack Paternal Grandmother    COPD Paternal Grandfather    Emphysema Paternal Grandfather    Congestive Heart Failure Paternal Aunt    Stomach cancer Paternal Uncle    Kidney cancer Maternal Uncle    Cancer Cousin        dx. teens/dx. 22s   Cancer Cousin    Colon cancer Cousin     CURRENT MEDICATIONS:  Current Outpatient Medications  Medication Sig Dispense Refill   albuterol (PROVENTIL HFA;VENTOLIN HFA) 108 (90 BASE) MCG/ACT inhaler Inhale 2 puffs into the lungs every 6 (six) hours as needed for wheezing or shortness of breath.      ALPRAZolam (XANAX) 0.5 MG tablet Take 0.5 mg by mouth 3 (three) times daily.     ANORO ELLIPTA 62.5-25 MCG/INH AEPB Inhale 1 puff into the lungs daily.     calcium carbonate (TUMS - DOSED IN MG ELEMENTAL CALCIUM) 500 MG chewable tablet Chew 2 tablets by mouth 3 (three) times daily  as needed for indigestion or heartburn.     fluconazole (DIFLUCAN) 100 MG tablet Take 2 tablets today, then 1 tablet daily x 20 more days. 22 tablet 0   furosemide (LASIX) 20 MG tablet Take 20 mg by mouth daily.     hydrochlorothiazide (HYDRODIURIL) 12.5 MG tablet Take 12.5 mg by mouth daily.     HYDROcodone-acetaminophen (HYCET) 7.5-325 mg/15 ml solution Take 15 mLs by mouth every 6 (six) hours as needed for moderate pain or severe pain. 1680 mL 0   HYDROcodone-acetaminophen (HYCET) 7.5-325 mg/15 ml solution Take 15 mLs by mouth every 6 (six) hours as needed for moderate pain. 473 mL 0   ibuprofen (ADVIL) 100 MG/5ML suspension Take 10 mLs (200 mg total) by mouth every 4 (four) hours as needed for moderate pain. 473 mL 0   lidocaine (XYLOCAINE) 2 % solution Patient: Mix 1part 2% viscous lidocaine, 1part H20. Swallow 65m of diluted mixture, 342m before meals and at bedtime, up to QID for sore throat. 200 mL 3   Menthol, Topical Analgesic, (BIOFREEZE EX) Apply 1 application topically daily as needed (pain).     nystatin (MYCOSTATIN) 100000 UNIT/ML suspension Take 5 mLs (500,000 Units total) by mouth 4 (four) times daily. 60 mL 0   pantoprazole (PROTONIX) 40 MG tablet Take 1 tablet by mouth once daily 90 tablet 0   polyethylene glycol (MIRALAX / GLYCOLAX) 17 g packet Take 17 g by mouth daily.     Potassium Chloride 40 MEQ/15ML (20%) SOLN Take 20 mEq by mouth 2 (two) times daily. 473 mL 0   sertraline (ZOLOFT) 100  MG tablet Take 100 mg by mouth daily.     sucralfate (CARAFATE) 1 g tablet Dissolve 1 tablet in 10 mL H20 and swallow 30 min prior to meals and bedtime. 40 tablet 3   Vitamin D, Ergocalciferol, 50000 units CAPS Take 1 capsule by mouth once a week.     lidocaine-prilocaine (EMLA) cream Apply 1 application. topically as needed. (Patient not taking: Reported on 05/25/2022) 30 g 0   ondansetron (ZOFRAN) 8 MG tablet Take 1 tablet (8 mg total) by mouth every 8 (eight) hours as needed for nausea or  vomiting. (Patient not taking: Reported on 05/25/2022) 60 tablet 0   prochlorperazine (COMPAZINE) 10 MG tablet Take 1 tablet (10 mg total) by mouth every 6 (six) hours as needed for nausea or vomiting. (Patient not taking: Reported on 05/25/2022) 60 tablet 0   No current facility-administered medications for this visit.    ALLERGIES:  No Known Allergies  PHYSICAL EXAM:  Performance status (ECOG): 1 - Symptomatic but completely ambulatory  Vitals:   05/25/22 1323  BP: 106/67  Pulse: 71  Resp: 16  Temp: (!) 97.4 F (36.3 C)  SpO2: 94%   Wt Readings from Last 3 Encounters:  05/25/22 183 lb 4.8 oz (83.1 kg)  05/19/22 180 lb 8.9 oz (81.9 kg)  05/05/22 180 lb 3.2 oz (81.7 kg)   Physical Exam Vitals reviewed.  Constitutional:      Appearance: Normal appearance. She is obese.  Cardiovascular:     Rate and Rhythm: Normal rate and regular rhythm.     Pulses: Normal pulses.     Heart sounds: Normal heart sounds.  Pulmonary:     Effort: Pulmonary effort is normal.     Breath sounds: Normal breath sounds.  Neurological:     General: No focal deficit present.     Mental Status: She is alert and oriented to person, place, and time.  Psychiatric:        Mood and Affect: Mood normal.        Behavior: Behavior normal.    LABORATORY DATA:  I have reviewed the labs as listed.     Latest Ref Rng & Units 05/19/2022   11:56 AM 05/05/2022    9:06 AM 04/20/2022   12:15 PM  CBC  WBC 4.0 - 10.5 K/uL 5.9  7.3  5.4   Hemoglobin 12.0 - 15.0 g/dL 13.8  13.8  13.3   Hematocrit 36.0 - 46.0 % 40.8  40.5  39.5   Platelets 150 - 400 K/uL 191  190  184       Latest Ref Rng & Units 05/19/2022   11:56 AM 05/05/2022    9:06 AM 04/20/2022   12:15 PM  CMP  Glucose 70 - 99 mg/dL 94  114  127   BUN 8 - 23 mg/dL _0 Creatinine 0.44 - 1.00 mg/dL 0.89  0.85  0.88   Sodium 135 - 145 mmol/L 138  137  138   Potassium 3.5 - 5.1 mmol/L 3.5  3.7  3.9   Chloride 98 - 111 mmol/L 103  105  104    CO2 22 - 32 mmol/L _1 Calcium 8.9 - 10.3 mg/dL 9.4  9.1  9.5   Total Protein 6.5 - 8.1 g/dL 7.1  7.1  6.9   Total Bilirubin 0.3 - 1.2 mg/dL 1.7  1.6  1.7   Alkaline Phos 38 - 126 U/L 61  63  63   AST 15 - 41 U/L _0 ALT 0 - 44 U/L _1 DIAGNOSTIC IMAGING:  I have independently reviewed the scans and discussed with the patient. CT Chest W Contrast  Result Date: 05/24/2022 CLINICAL DATA:  A 71 year old female presents for evaluation of non-small cell lung cancer, follow-up assessment. * Tracking Code: BO * EXAM: CT CHEST WITH CONTRAST TECHNIQUE: Multidetector CT imaging of the chest was performed during intravenous contrast administration. RADIATION DOSE REDUCTION: This exam was performed according to the departmental dose-optimization program which includes automated exposure control, adjustment of the mA and/or kV according to patient size and/or use of iterative reconstruction technique. CONTRAST:  3m OMNIPAQUE IOHEXOL 300 MG/ML  SOLN COMPARISON:  October 18, 2021.  Also compared to July PET imaging. FINDINGS: Cardiovascular: RIGHT-sided Port-A-Cath terminates at the lower RIGHT atrium. Heart size is stable and normal though there is interval development of small non nodular pericardial effusion. Central pulmonary vasculature is unremarkable. Mediastinum/Nodes: RIGHT thyroid lobe with heterogeneity 3.9 cm greatest axial dimension. Resolution of many of the enlarged lymph nodes in the mediastinum. Precarinal lymph node (image 52/2) 11 mm previously approximately 20 mm short axis as compared to the March exam. This lymph node measured approximately 11 mm on the prior PET. Scattered smaller lymph nodes have diminished in conspicuity compared to recent PET. This is the largest of any remaining lymph nodes in the chest. No signs of hilar adenopathy or subcarinal lymphadenopathy. No internal mammary, thoracic inlet or axillary lymphadenopathy. Esophageal thickening in the  mediastinum presumably post treatment related. Lungs/Pleura: Bandlike consolidative changes, septal thickening and ground-glass in the mid chest predominantly focused about the LEFT hilum. This is developed since previous imaging. The LEFT lower lobe nodule is now obscured by these changes. Background pulmonary emphysema is redemonstrated. Airways are patent. Upper Abdomen: No acute findings relative to imaged portions of liver, biliary tree, pancreas, spleen, adrenal glands or kidneys. No acute gastrointestinal findings in the upper abdomen. Musculoskeletal: Post lumpectomy changes in the LEFT breast. Skin thickening over the LEFT breast which is not changed. No chest wall mass. No acute bone finding. No destructive bone process. Spinal degenerative changes. IMPRESSION: 1. Marked response to therapy since March. No new or enlarging lymph nodes with stable precarinal lymph node. 2. Post radiation changes in the LEFT chest obscuring the area of nodularity seen in the LEFT lower lobe on the prior study. No new pulmonary nodules. Attention on follow-up. 3. Interval development of small non nodular appearing pericardial effusion. This may be reactive in the setting of recent radiotherapy and is seen in concert with esophageal thickening also likely related to recent radiation. 4. Asymmetrically enlarged RIGHT thyroid gland with nodular changes previously evaluated with ultrasound. Aortic Atherosclerosis (ICD10-I70.0) and Emphysema (ICD10-J43.9). Electronically Signed   By: GZetta BillsM.D.   On: 05/24/2022 10:04     ASSESSMENT:  Stage III squamous cell cancer: - LLL lung mass FNA (08/23/2021): Malignant cells consistent with squamous cell carcinoma. - Lymph node 4R and 7: Malignant cells consistent with NSCLC. - Chemoradiation therapy with weekly carboplatin and paclitaxel from 11/01/2021, week for chemotherapy on 11/23/2021.  XRT completed on 12/16/2021. - She missed further doses of chemotherapy secondary to  thrombocytopenia. - PET scan (01/27/2022): Interval response to therapy with decreasing size of mediastinal lymph nodes.  No new disease.    Social/family history: - Seen today with her Sister DNeoma Laming  Patient lives by herself.  Quit smoking on 02/22/2021.  Smoked 2 packs/day for 40 years. - Mother died of ovarian cancer.  Brother died of colon cancer at age 78.  Sister had melanoma.  Paternal uncles had stomach cancer and lung cancer.  Paternal aunt had thyroid cancer.  Another paternal aunt had pancreatic cancer.  3.  Supraglottic squamous cell carcinoma (T1N1): - Definitive radiation from 08/16/2021 through 09/13/2021  PLAN:  Stage III squamous cell lung cancer: -She is tolerating durvalumab reasonably well. - CT chest (05/20/2022): Resolution of many enlarged lymph nodes in the mediastinum.  Precarinal lymph node is 50% decreased in size.  No new areas were seen. - Very small pericardial effusion seen.  This may be reactive in the setting of radiation. - Labs today showed elevated total bilirubin 1.7 and rest of the LFTs are normal.  Electrolytes and kidney function is normal.  CBC was great.  TSH was 7.86 and has been remaining in the same range since September. - Proceed with durvalumab every 2 weeks.  RTC 6 weeks for follow-up.  We will closely monitor TSH.  If there is any worsening, consider Synthroid.  2.  Supraglottic squamous cell carcinoma (T1N1): - PET scan on 01/27/2022 with no evidence of disease. - She was seen by Dr. Redmond Baseman of ENT recently and laryngeal exam showed inflammation and prominent webbing of the anterior glottis with no evidence of tumor.  3.  Throat pain and sternal chest pain: - Continue hydrocodone 10 mg liquid every 6 hours as needed.  4.  Hypokalemia: - Continue potassium 20 mEq daily.  Potassium today is normal.  5.  Constipation: - Continue stool softener and MiraLAX.  If no improvement consider lactulose.    Orders placed this encounter:  No orders of  the defined types were placed in this encounter.     Derek Jack, MD [place] [phone]

## 2022-05-31 ENCOUNTER — Other Ambulatory Visit: Payer: Self-pay

## 2022-06-01 ENCOUNTER — Encounter: Payer: Self-pay | Admitting: Hematology

## 2022-06-01 ENCOUNTER — Other Ambulatory Visit: Payer: Self-pay | Admitting: *Deleted

## 2022-06-01 ENCOUNTER — Other Ambulatory Visit (HOSPITAL_COMMUNITY): Payer: Self-pay

## 2022-06-01 ENCOUNTER — Encounter: Payer: Self-pay | Admitting: Hematology and Oncology

## 2022-06-01 MED ORDER — HYDROCODONE-ACETAMINOPHEN 7.5-325 MG/15ML PO SOLN
15.0000 mL | Freq: Four times a day (QID) | ORAL | 0 refills | Status: DC | PRN
  Filled 2022-06-01: qty 473, 8d supply, fill #0

## 2022-06-01 NOTE — Progress Notes (Signed)
Ms. Althaus presents today for follow after completing radiation to her left lung/subclavian (also completed radiation to her larynx on 09/13/2021)   PAIN: Having chronic back pain-nothing too concerning  RESPIRATORY: Pt with dyspnea and hoarse with talking a lot.   SWALLOWING/DIET: Pt having trouble swallowing big pills. The patient eats a regular, healthy diet.. Denies taste changes.  OTHER: Pt complains of mild fatigue, more weakness, and c/o left hand tremors.   Wt Readings from Last 3 Encounters:  06/08/22 183 lb 8 oz (83.2 kg)  05/25/22 183 lb 4.8 oz (83.1 kg)  05/19/22 180 lb 8.9 oz (81.9 kg)    There were no vitals taken for this visit.   Wt Readings from Last 3 Encounters:  05/25/22 183 lb 4.8 oz (83.1 kg)  05/19/22 180 lb 8.9 oz (81.9 kg)  05/05/22 180 lb 3.2 oz (81.7 kg)    Vitals:   06/08/22 1538  BP: 137/82  Pulse: 72  Resp: 18  Temp: (!) 97.5 F (36.4 C)  SpO2: 100%

## 2022-06-02 ENCOUNTER — Inpatient Hospital Stay: Payer: Medicare HMO

## 2022-06-02 ENCOUNTER — Other Ambulatory Visit (HOSPITAL_COMMUNITY): Payer: Self-pay

## 2022-06-02 ENCOUNTER — Encounter: Payer: Self-pay | Admitting: Hematology and Oncology

## 2022-06-02 ENCOUNTER — Inpatient Hospital Stay: Payer: Medicare HMO | Admitting: Hematology

## 2022-06-02 ENCOUNTER — Encounter: Payer: Self-pay | Admitting: Hematology

## 2022-06-02 ENCOUNTER — Other Ambulatory Visit: Payer: Self-pay

## 2022-06-02 VITALS — BP 121/78 | HR 70 | Temp 96.6°F | Resp 20

## 2022-06-02 DIAGNOSIS — Z8041 Family history of malignant neoplasm of ovary: Secondary | ICD-10-CM | POA: Diagnosis not present

## 2022-06-02 DIAGNOSIS — M542 Cervicalgia: Secondary | ICD-10-CM | POA: Diagnosis not present

## 2022-06-02 DIAGNOSIS — Z8719 Personal history of other diseases of the digestive system: Secondary | ICD-10-CM | POA: Diagnosis not present

## 2022-06-02 DIAGNOSIS — I3139 Other pericardial effusion (noninflammatory): Secondary | ICD-10-CM | POA: Diagnosis not present

## 2022-06-02 DIAGNOSIS — J439 Emphysema, unspecified: Secondary | ICD-10-CM | POA: Diagnosis not present

## 2022-06-02 DIAGNOSIS — Z79899 Other long term (current) drug therapy: Secondary | ICD-10-CM | POA: Diagnosis not present

## 2022-06-02 DIAGNOSIS — R197 Diarrhea, unspecified: Secondary | ICD-10-CM | POA: Diagnosis not present

## 2022-06-02 DIAGNOSIS — C3432 Malignant neoplasm of lower lobe, left bronchus or lung: Secondary | ICD-10-CM | POA: Diagnosis not present

## 2022-06-02 DIAGNOSIS — J029 Acute pharyngitis, unspecified: Secondary | ICD-10-CM | POA: Diagnosis not present

## 2022-06-02 DIAGNOSIS — Z800 Family history of malignant neoplasm of digestive organs: Secondary | ICD-10-CM | POA: Diagnosis not present

## 2022-06-02 DIAGNOSIS — Z808 Family history of malignant neoplasm of other organs or systems: Secondary | ICD-10-CM | POA: Diagnosis not present

## 2022-06-02 DIAGNOSIS — E876 Hypokalemia: Secondary | ICD-10-CM | POA: Diagnosis not present

## 2022-06-02 DIAGNOSIS — Z170 Estrogen receptor positive status [ER+]: Secondary | ICD-10-CM

## 2022-06-02 DIAGNOSIS — Z9049 Acquired absence of other specified parts of digestive tract: Secondary | ICD-10-CM | POA: Diagnosis not present

## 2022-06-02 DIAGNOSIS — R0602 Shortness of breath: Secondary | ICD-10-CM | POA: Diagnosis not present

## 2022-06-02 DIAGNOSIS — C3490 Malignant neoplasm of unspecified part of unspecified bronchus or lung: Secondary | ICD-10-CM

## 2022-06-02 DIAGNOSIS — Z801 Family history of malignant neoplasm of trachea, bronchus and lung: Secondary | ICD-10-CM | POA: Diagnosis not present

## 2022-06-02 DIAGNOSIS — K5900 Constipation, unspecified: Secondary | ICD-10-CM | POA: Diagnosis not present

## 2022-06-02 DIAGNOSIS — C321 Malignant neoplasm of supraglottis: Secondary | ICD-10-CM | POA: Diagnosis not present

## 2022-06-02 DIAGNOSIS — D696 Thrombocytopenia, unspecified: Secondary | ICD-10-CM | POA: Diagnosis not present

## 2022-06-02 DIAGNOSIS — Z87891 Personal history of nicotine dependence: Secondary | ICD-10-CM | POA: Diagnosis not present

## 2022-06-02 DIAGNOSIS — Z923 Personal history of irradiation: Secondary | ICD-10-CM | POA: Diagnosis not present

## 2022-06-02 DIAGNOSIS — Z79811 Long term (current) use of aromatase inhibitors: Secondary | ICD-10-CM | POA: Diagnosis not present

## 2022-06-02 DIAGNOSIS — C50412 Malignant neoplasm of upper-outer quadrant of left female breast: Secondary | ICD-10-CM | POA: Diagnosis not present

## 2022-06-02 DIAGNOSIS — Z5112 Encounter for antineoplastic immunotherapy: Secondary | ICD-10-CM | POA: Diagnosis not present

## 2022-06-02 LAB — COMPREHENSIVE METABOLIC PANEL
ALT: 7 U/L (ref 0–44)
AST: 13 U/L — ABNORMAL LOW (ref 15–41)
Albumin: 3.4 g/dL — ABNORMAL LOW (ref 3.5–5.0)
Alkaline Phosphatase: 57 U/L (ref 38–126)
Anion gap: 5 (ref 5–15)
BUN: 13 mg/dL (ref 8–23)
CO2: 24 mmol/L (ref 22–32)
Calcium: 8.9 mg/dL (ref 8.9–10.3)
Chloride: 108 mmol/L (ref 98–111)
Creatinine, Ser: 0.83 mg/dL (ref 0.44–1.00)
GFR, Estimated: >60 mL/min (ref >60)
Glucose, Bld: 115 mg/dL — ABNORMAL HIGH (ref 70–99)
Potassium: 4.1 mmol/L (ref 3.5–5.1)
Sodium: 137 mmol/L (ref 135–145)
Total Bilirubin: 1.1 mg/dL (ref 0.3–1.2)
Total Protein: 6.6 g/dL (ref 6.5–8.1)

## 2022-06-02 LAB — CBC WITH DIFFERENTIAL/PLATELET
Abs Immature Granulocytes: 0.02 10*3/uL (ref 0.00–0.07)
Basophils Absolute: 0 10*3/uL (ref 0.0–0.1)
Basophils Relative: 0 %
Eosinophils Absolute: 0.1 10*3/uL (ref 0.0–0.5)
Eosinophils Relative: 3 %
HCT: 38.9 % (ref 36.0–46.0)
Hemoglobin: 12.9 g/dL (ref 12.0–15.0)
Immature Granulocytes: 0 %
Lymphocytes Relative: 12 %
Lymphs Abs: 0.6 10*3/uL — ABNORMAL LOW (ref 0.7–4.0)
MCH: 30.4 pg (ref 26.0–34.0)
MCHC: 33.2 g/dL (ref 30.0–36.0)
MCV: 91.7 fL (ref 80.0–100.0)
Monocytes Absolute: 0.3 10*3/uL (ref 0.1–1.0)
Monocytes Relative: 6 %
Neutro Abs: 3.8 10*3/uL (ref 1.7–7.7)
Neutrophils Relative %: 79 %
Platelets: 171 10*3/uL (ref 150–400)
RBC: 4.24 MIL/uL (ref 3.87–5.11)
RDW: 12.9 % (ref 11.5–15.5)
WBC: 4.8 10*3/uL (ref 4.0–10.5)
nRBC: 0 % (ref 0.0–0.2)

## 2022-06-02 LAB — TSH: TSH: 15.197 u[IU]/mL — ABNORMAL HIGH (ref 0.350–4.500)

## 2022-06-02 LAB — MAGNESIUM: Magnesium: 2.1 mg/dL (ref 1.7–2.4)

## 2022-06-02 MED ORDER — HEPARIN SOD (PORK) LOCK FLUSH 100 UNIT/ML IV SOLN
500.0000 [IU] | Freq: Once | INTRAVENOUS | Status: AC | PRN
  Administered 2022-06-02: 500 [IU]

## 2022-06-02 MED ORDER — SODIUM CHLORIDE 0.9 % IV SOLN
10.0000 mg/kg | Freq: Once | INTRAVENOUS | Status: AC
  Administered 2022-06-02: 860 mg via INTRAVENOUS
  Filled 2022-06-02: qty 7.2

## 2022-06-02 MED ORDER — SODIUM CHLORIDE 0.9% FLUSH
10.0000 mL | INTRAVENOUS | Status: DC | PRN
  Administered 2022-06-02: 10 mL

## 2022-06-02 MED ORDER — SODIUM CHLORIDE 0.9 % IV SOLN: Freq: Once | INTRAVENOUS | Status: AC

## 2022-06-02 NOTE — Progress Notes (Signed)
Patient presents today for Imfinzi infusion per providers order.  Vital signs and labs within parameters for treatment.  Patient has no new complaints at this time.  Treatment given today per MD orders.  Imfinzi infusion without adverse affects.  Vital signs stable.  No complaints at this time.  Discharge from clinic ambulatory in stable condition.  Alert and oriented X 3.  Follow up with [facility] as scheduled.

## 2022-06-02 NOTE — Patient Instructions (Signed)
[facility]  Discharge Instructions: Thank you for choosing [facility] to provide your oncology and hematology care.  If you have a lab appointment with the [facility], please come in thru the Main Entrance and check in at the main information desk.  Wear comfortable clothing and clothing appropriate for easy access to any Portacath or PICC line.   We strive to give you quality time with your provider. You may need to reschedule your appointment if you arrive late (15 or more minutes).  Arriving late affects you and other patients whose appointments are after yours.  Also, if you miss three or more appointments without notifying the office, you may be dismissed from the clinic at the provider's discretion.      For prescription refill requests, have your pharmacy contact our office and allow 72 hours for refills to be completed.    Today you received the following chemotherapy and/or immunotherapy agents Imfinzi      To help prevent nausea and vomiting after your treatment, we encourage you to take your nausea medication as directed.  BELOW ARE SYMPTOMS THAT SHOULD BE REPORTED IMMEDIATELY: *FEVER GREATER THAN 100.4 F (38 C) OR HIGHER *CHILLS OR SWEATING *NAUSEA AND VOMITING THAT IS NOT CONTROLLED WITH YOUR NAUSEA MEDICATION *UNUSUAL SHORTNESS OF BREATH *UNUSUAL BRUISING OR BLEEDING *URINARY PROBLEMS (pain or burning when urinating, or frequent urination) *BOWEL PROBLEMS (unusual diarrhea, constipation, pain near the anus) TENDERNESS IN MOUTH AND THROAT WITH OR WITHOUT PRESENCE OF ULCERS (sore throat, sores in mouth, or a toothache) UNUSUAL RASH, SWELLING OR PAIN  UNUSUAL VAGINAL DISCHARGE OR ITCHING   Items with * indicate a potential emergency and should be followed up as soon as possible or go to the Emergency Department if any problems should occur.  Please show the CHEMOTHERAPY ALERT CARD or IMMUNOTHERAPY ALERT CARD at check-in to the Emergency  Department and triage nurse.  Should you have questions after your visit or need to cancel or reschedule your appointment, please contact [facility] [phone]  and follow the prompts.  Office hours are 8:00 a.m. to 4:30 p.m. Monday - Friday. Please note that voicemails left after 4:00 p.m. may not be returned until the following business day.  We are closed weekends and major holidays. You have access to a nurse at all times for urgent questions. Please call the main number to the clinic [phone] and follow the prompts.  For any non-urgent questions, you may also contact your provider using MyChart. We now offer e-Visits for anyone 102 and older to request care online for non-urgent symptoms. For details visit [email or web address].   Also download the MyChart app! Go to the app store, search "MyChart", open the app, select [facility], and log in with your MyChart username and password.  Masks are optional in the cancer centers. If you would like for your care team to wear a mask while they are taking care of you, please let them know. You may have one support person who is at least 69 years old accompany you for your appointments.

## 2022-06-03 ENCOUNTER — Other Ambulatory Visit (HOSPITAL_COMMUNITY): Payer: Self-pay

## 2022-06-07 ENCOUNTER — Telehealth: Payer: Self-pay | Admitting: *Deleted

## 2022-06-07 NOTE — Telephone Encounter (Signed)
Called patient to remind of lab and fu appt. for 06-08-22, spoke with patient and she is aware of these appts.

## 2022-06-08 ENCOUNTER — Ambulatory Visit
Admission: RE | Admit: 2022-06-08 | Discharge: 2022-06-08 | Disposition: A | Discharge status: Home / Self Care | Payer: Medicare HMO | Source: Ambulatory Visit | Attending: Radiation Oncology | Admitting: Radiation Oncology

## 2022-06-08 ENCOUNTER — Encounter: Payer: Self-pay | Admitting: Radiation Oncology

## 2022-06-08 ENCOUNTER — Other Ambulatory Visit: Payer: Self-pay

## 2022-06-08 ENCOUNTER — Ambulatory Visit
Admission: RE | Admit: 2022-06-08 | Discharge: 2022-06-08 | Disposition: A | Discharge status: Home / Self Care | Payer: Medicare HMO | Source: Ambulatory Visit | Attending: Hematology and Oncology | Admitting: Hematology and Oncology

## 2022-06-08 VITALS — BP 137/82 | HR 72 | Temp 97.5°F | Resp 18 | Ht 64.0 in | Wt 183.5 lb

## 2022-06-08 DIAGNOSIS — C3490 Malignant neoplasm of unspecified part of unspecified bronchus or lung: Secondary | ICD-10-CM

## 2022-06-08 DIAGNOSIS — Z79899 Other long term (current) drug therapy: Secondary | ICD-10-CM | POA: Insufficient documentation

## 2022-06-08 DIAGNOSIS — R490 Dysphonia: Secondary | ICD-10-CM | POA: Insufficient documentation

## 2022-06-08 DIAGNOSIS — Z923 Personal history of irradiation: Secondary | ICD-10-CM | POA: Insufficient documentation

## 2022-06-08 DIAGNOSIS — Z8521 Personal history of malignant neoplasm of larynx: Secondary | ICD-10-CM | POA: Insufficient documentation

## 2022-06-08 DIAGNOSIS — C3432 Malignant neoplasm of lower lobe, left bronchus or lung: Secondary | ICD-10-CM | POA: Insufficient documentation

## 2022-06-08 DIAGNOSIS — Z87891 Personal history of nicotine dependence: Secondary | ICD-10-CM | POA: Diagnosis not present

## 2022-06-08 MED ORDER — OXYMETAZOLINE HCL 0.05 % NA SOLN
1.0000 | Freq: Two times a day (BID) | NASAL | Status: DC
  Administered 2022-06-08: 1 via NASAL
  Filled 2022-06-08: qty 30

## 2022-06-09 LAB — TSH: TSH: 11.957 u[IU]/mL — ABNORMAL HIGH (ref 0.350–4.500)

## 2022-06-10 NOTE — Progress Notes (Signed)
Radiation Oncology         [phone] ________________________________  Name: NORA SABEY MRN: [id number]  Date: 06/08/2022  DOB: 10/22/1950  Follow-Up Visit Note  Outpatient  CC: [name], MD  Melida Quitter, MD  Diagnosis and Prior Radiotherapy:  1. 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 [facility]) Staging form: Breast, AJCC 7th Edition - Clinical stage from 07/01/2014: Stage IIA (T2, N0, M0) - Unsigned Staged by: Managing physician Diagnostic confirmation: Positive histology Laterality: Left Tumor size (mm): 2.6 Method of detection of distant metastases: Clinical Histologic grade (G): G2 Histologic grading system: 3 grade system Lymph-vascular invasion (LVI): LVI present/identified, NOS Residual tumor (R): R0 - None Paget's disease: Negative Estrogen receptor status: Positive Estrogen receptor test method: IHC Intensity of estrogen receptor staining: Strong Percentage of positive estrogen receptors (%): 100 Progesterone receptor status: Positive Progesterone receptor test method: IHC Intensity of progesterone receptor staining: Strong Percentage of positive progesterone receptors (%): 91 HER2 status: Negative 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 IHC of regional lymph nodes: Positive Results for molecular studies of regional lymph nodes: Not assessed Circulating tumor cells (CTC): Not assessed Disseminated tumor cells: Not assessed Stage used in treatment planning: Yes National guidelines used in treatment planning: Yes Type of national guideline used in treatment planning: NCCN - Pathologic stage from 08/20/2014: Stage IIB (T2, N1a, cM0) - Unsigned  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[facility]) Staging form: Larynx - Supraglottis, AJCC 8th Edition - Clinical stage from 07/30/2021: cT1, cN1 -  Unsigned  Squamous cell lung cancer ([facility]) Staging form: Lung, AJCC 8th Edition - Clinical stage from 09/08/2021: Stage IIIB (cT1b, cN3, cM0) - Signed by Eppie Gibson, MD on 09/08/2021 Stage prefix: Initial diagnosis    Radiation Treatment Dates: 08/16/2021 through 09/13/2021 Site Technique Total Dose (Gy) Dose per Fx (Gy) Completed Fx Beam Energies  Larynx: HN_Larynx IMRT 54/54 2.7 20/20 6X     Radiation Treatment Dates: 11/03/2021 through 12/16/2021   Site/ Technique: Thorax Chest_LLL/ IMRT  (lung mass and all regional adenopathy, including L SCV, included)  Dose 60Gy in  30 fractions at 2 Gy/ fx   CHIEF COMPLAINT: Here for follow-up and surveillance of lung and throat cancer  Narrative:      Ms. Vandervort presents today for follow after completing radiation to her left lung/subclavian (also completed radiation to her larynx on 09/13/2021)   PAIN: Having chronic back pain-nothing too concerning  RESPIRATORY: Pt with dyspnea and hoarse with talking a lot.   SWALLOWING/DIET: Pt having trouble swallowing big pills. The patient eats a regular, healthy diet.. Denies taste changes.  OTHER: Pt complains of mild fatigue, more weakness, and c/o left hand tremors.   However, overall, she is well. She is doing well with systemic therapy and had reassuring results from recent systemic restaging scans.  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  Meds: Current Outpatient Medications  Medication Sig Dispense Refill   albuterol (PROVENTIL HFA;VENTOLIN HFA) 108 (90 BASE) MCG/ACT inhaler Inhale 2 puffs into the lungs every 6 (six) hours as needed for wheezing or shortness of breath.      ALPRAZolam (XANAX) 0.5 MG tablet Take 0.5 mg by mouth 3 (three) times daily.     ANORO ELLIPTA 62.5-25 MCG/INH AEPB Inhale 1 puff into the lungs daily.     calcium carbonate (TUMS - DOSED IN MG ELEMENTAL CALCIUM) 500 MG chewable tablet Chew 2 tablets by mouth 3 (three) times daily as needed for indigestion or heartburn.     furosemide (LASIX) 20 MG tablet Take 20 mg by mouth daily.     hydrochlorothiazide (HYDRODIURIL) 12.5 MG tablet Take 12.5 mg by mouth daily.     HYDROcodone-acetaminophen (HYCET) 7.5-325 mg/15 ml solution Take 15 mLs by mouth every 6 (six) hours as needed for moderate pain. 1680 mL 0   lidocaine (XYLOCAINE) 2 % solution Patient: Mix 1part 2% viscous lidocaine, 1part H20. Swallow 51m of diluted mixture, 375m before meals and at bedtime, up to QID for sore throat. 200 mL 3   lidocaine-prilocaine (EMLA) cream Apply 1 application. topically as needed. 30 g 0   Menthol, Topical Analgesic, (BIOFREEZE EX) Apply 1 application topically daily as needed (pain).     ondansetron (ZOFRAN) 8 MG tablet Take 1 tablet (8 mg total) by mouth every 8 (eight) hours as needed for nausea or vomiting. 60 tablet 0   pantoprazole (PROTONIX) 40 MG tablet Take 1 tablet by mouth once daily 90 tablet 0   Potassium Chloride 40 MEQ/15ML (20%) SOLN Take 20 mEq by mouth 2 (two) times daily. 473 mL 0   prochlorperazine (COMPAZINE) 10 MG tablet Take 1 tablet (10 mg total) by mouth every 6 (six) hours as needed for nausea or vomiting. 60 tablet 0   sertraline (ZOLOFT) 100 MG tablet Take 100 mg by mouth daily.     sucralfate (CARAFATE) 1 g tablet Dissolve 1 tablet in 10 mL H20 and swallow 30 min prior to meals and bedtime. 40 tablet 3   Vitamin D,  Ergocalciferol, 50000 units CAPS Take 1 capsule by mouth once a week.     fluconazole (DIFLUCAN) 100 MG tablet Take 2 tablets today, then 1 tablet daily x 20 more days. (Patient not taking: Reported on 06/08/2022) 22 tablet 0   ibuprofen (ADVIL) 100 MG/5ML suspension Take 10 mLs (200 mg total) by mouth every 4 (four) hours as needed for moderate pain. (Patient not taking: Reported on 06/08/2022) 473 mL 0   nystatin (MYCOSTATIN) 100000 UNIT/ML suspension Take 5 mLs (500,000 Units total) by mouth 4 (four) times daily. (Patient not taking: Reported on 06/08/2022) 60 mL 0   polyethylene glycol (MIRALAX / GLYCOLAX) 17 g packet Take 17 g by mouth daily. (Patient not taking: Reported on 06/08/2022)     No current facility-administered medications for this encounter.    Physical Findings: The patient is in no acute distress. Patient is alert and oriented.  height is _0  (1.626 m) and weight is 183 lb 8 oz (83.2 kg). Her  oral temperature is 97.5 F (36.4 C) (abnormal). Her blood pressure is 137/82 and her pulse is 72. Her respiration is 18 and oxygen saturation is 100%. .    General :No acute distress  Neck: no adenopathy  HEENT: no oral lesions, dentures removed Chest CTAB Heart RRR Skin: intact over neck, upper chest MSK: Ambulatory, well-nourished  PROCEDURE NOTE: After obtaining consent and spraying nasal cavity with topical oxymetazoline, the flexible endoscope was coated with lidocaine gel and introduced and passed through the nasal cavity.  The nasopharynx, oropharynx, hypopharynx, and larynx  were then examined. No lesions appreciated in the mucosal axis.  The true cords were symmetrically mobile.  There is webbing between the anterior glottic folds  Lab Findings: Lab Results  Component Value Date   WBC 4.8 06/02/2022   HGB 12.9 06/02/2022   HCT 38.9 06/02/2022   MCV 91.7 06/02/2022   PLT 171 06/02/2022   Lab Results  Component Value Date   TSH 11.957 (H) 06/08/2022      Radiographic Findings: CT Chest W Contrast  Result Date: 05/24/2022 CLINICAL DATA:  A 71 year old female presents for evaluation of non-small cell lung cancer, follow-up assessment. * Tracking Code: BO * EXAM: CT CHEST WITH CONTRAST TECHNIQUE: Multidetector CT imaging of the chest was performed during intravenous contrast administration. RADIATION DOSE REDUCTION: This exam was performed according to the departmental dose-optimization program which includes automated exposure control, adjustment of the mA and/or kV according to patient size and/or use of iterative reconstruction technique. CONTRAST:  39m OMNIPAQUE IOHEXOL 300 MG/ML  SOLN COMPARISON:  October 18, 2021.  Also compared to July PET imaging. FINDINGS: Cardiovascular: RIGHT-sided Port-A-Cath terminates at the lower RIGHT atrium. Heart size is stable and normal though there is interval development of small non nodular pericardial effusion. Central pulmonary vasculature is unremarkable. Mediastinum/Nodes: RIGHT thyroid lobe with heterogeneity 3.9 cm greatest axial dimension. Resolution of many of the enlarged lymph nodes in the mediastinum. Precarinal lymph node (image 52/2) 11 mm previously approximately 20 mm short axis as compared to the March exam. This lymph node measured approximately 11 mm on the prior PET. Scattered smaller lymph nodes have diminished in conspicuity compared to recent PET. This is the largest of any remaining lymph nodes in the chest. No signs of hilar adenopathy or subcarinal lymphadenopathy. No internal mammary, thoracic inlet or axillary lymphadenopathy. Esophageal thickening in the mediastinum presumably post treatment related. Lungs/Pleura: Bandlike consolidative changes, septal thickening and ground-glass in the mid chest predominantly focused about the LEFT hilum. This is developed since previous imaging. The LEFT lower lobe nodule is now obscured by these changes. Background pulmonary emphysema is redemonstrated.  Airways are patent. Upper Abdomen: No acute findings relative to imaged portions of liver, biliary tree, pancreas, spleen, adrenal glands or kidneys. No acute gastrointestinal findings in the upper abdomen. Musculoskeletal: Post lumpectomy changes in the LEFT breast. Skin thickening over the LEFT breast which is not changed. No chest wall mass. No acute bone finding. No destructive bone process. Spinal degenerative changes. IMPRESSION: 1. Marked response to therapy since March. No new or enlarging lymph nodes with stable precarinal lymph node. 2. Post radiation changes in the LEFT chest obscuring the area of nodularity seen in the LEFT lower lobe on the prior study. No new pulmonary nodules. Attention on follow-up. 3. Interval development of small non nodular appearing pericardial effusion. This may be reactive in the setting of recent radiotherapy and is seen in concert with esophageal thickening also likely related to recent radiation. 4.  Asymmetrically enlarged RIGHT thyroid gland with nodular changes previously evaluated with ultrasound. Aortic Atherosclerosis (ICD10-I70.0) and Emphysema (ICD10-J43.9). Electronically Signed   By: Zetta Bills M.D.   On: 05/24/2022 10:04    Impression/Plan:   She is doing well overall.  Continue durvalumab under the care of medical oncology for her lung cancer.  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  She following with otolaryngology as needed. I will see her back in 6 months for repeat laryngoscopy.  She knows to call if she has questions before then.  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     Her TSH is somewhat elevated as ordered by medical oncology.  I will defer to Dr. Delton Coombes on management for this (message sent to let him know).  On date of service, in total, I spent 35 minutes on this encounter. Patient was seen in person.  Note was signed after  date of encounter.  Minutes pertaining to date of encounter only. _____________________________________   Eppie Gibson, MD

## 2022-06-14 ENCOUNTER — Inpatient Hospital Stay: Payer: Medicare HMO

## 2022-06-14 ENCOUNTER — Other Ambulatory Visit: Payer: Self-pay

## 2022-06-14 VITALS — BP 119/63 | HR 82 | Temp 98.2°F | Resp 18

## 2022-06-14 DIAGNOSIS — I3139 Other pericardial effusion (noninflammatory): Secondary | ICD-10-CM | POA: Diagnosis not present

## 2022-06-14 DIAGNOSIS — Z87891 Personal history of nicotine dependence: Secondary | ICD-10-CM | POA: Diagnosis not present

## 2022-06-14 DIAGNOSIS — D696 Thrombocytopenia, unspecified: Secondary | ICD-10-CM | POA: Diagnosis not present

## 2022-06-14 DIAGNOSIS — Z79899 Other long term (current) drug therapy: Secondary | ICD-10-CM | POA: Diagnosis not present

## 2022-06-14 DIAGNOSIS — K5900 Constipation, unspecified: Secondary | ICD-10-CM | POA: Diagnosis not present

## 2022-06-14 DIAGNOSIS — Z79811 Long term (current) use of aromatase inhibitors: Secondary | ICD-10-CM | POA: Diagnosis not present

## 2022-06-14 DIAGNOSIS — Z9049 Acquired absence of other specified parts of digestive tract: Secondary | ICD-10-CM | POA: Diagnosis not present

## 2022-06-14 DIAGNOSIS — C3490 Malignant neoplasm of unspecified part of unspecified bronchus or lung: Secondary | ICD-10-CM

## 2022-06-14 DIAGNOSIS — M542 Cervicalgia: Secondary | ICD-10-CM | POA: Diagnosis not present

## 2022-06-14 DIAGNOSIS — Z8041 Family history of malignant neoplasm of ovary: Secondary | ICD-10-CM | POA: Diagnosis not present

## 2022-06-14 DIAGNOSIS — R300 Dysuria: Secondary | ICD-10-CM

## 2022-06-14 DIAGNOSIS — Z5112 Encounter for antineoplastic immunotherapy: Secondary | ICD-10-CM | POA: Diagnosis not present

## 2022-06-14 DIAGNOSIS — Z801 Family history of malignant neoplasm of trachea, bronchus and lung: Secondary | ICD-10-CM | POA: Diagnosis not present

## 2022-06-14 DIAGNOSIS — Z923 Personal history of irradiation: Secondary | ICD-10-CM | POA: Diagnosis not present

## 2022-06-14 DIAGNOSIS — R0602 Shortness of breath: Secondary | ICD-10-CM | POA: Diagnosis not present

## 2022-06-14 DIAGNOSIS — C321 Malignant neoplasm of supraglottis: Secondary | ICD-10-CM | POA: Diagnosis not present

## 2022-06-14 DIAGNOSIS — Z170 Estrogen receptor positive status [ER+]: Secondary | ICD-10-CM | POA: Diagnosis not present

## 2022-06-14 DIAGNOSIS — Z8719 Personal history of other diseases of the digestive system: Secondary | ICD-10-CM | POA: Diagnosis not present

## 2022-06-14 DIAGNOSIS — E876 Hypokalemia: Secondary | ICD-10-CM | POA: Diagnosis not present

## 2022-06-14 DIAGNOSIS — Z800 Family history of malignant neoplasm of digestive organs: Secondary | ICD-10-CM | POA: Diagnosis not present

## 2022-06-14 DIAGNOSIS — J029 Acute pharyngitis, unspecified: Secondary | ICD-10-CM | POA: Diagnosis not present

## 2022-06-14 DIAGNOSIS — R197 Diarrhea, unspecified: Secondary | ICD-10-CM | POA: Diagnosis not present

## 2022-06-14 DIAGNOSIS — C3432 Malignant neoplasm of lower lobe, left bronchus or lung: Secondary | ICD-10-CM | POA: Diagnosis not present

## 2022-06-14 DIAGNOSIS — C50412 Malignant neoplasm of upper-outer quadrant of left female breast: Secondary | ICD-10-CM | POA: Diagnosis not present

## 2022-06-14 DIAGNOSIS — J439 Emphysema, unspecified: Secondary | ICD-10-CM | POA: Diagnosis not present

## 2022-06-14 DIAGNOSIS — Z808 Family history of malignant neoplasm of other organs or systems: Secondary | ICD-10-CM | POA: Diagnosis not present

## 2022-06-14 LAB — CBC WITH DIFFERENTIAL/PLATELET
Abs Immature Granulocytes: 0.01 10*3/uL (ref 0.00–0.07)
Basophils Absolute: 0 10*3/uL (ref 0.0–0.1)
Basophils Relative: 1 %
Eosinophils Absolute: 0.1 10*3/uL (ref 0.0–0.5)
Eosinophils Relative: 3 %
HCT: 40 % (ref 36.0–46.0)
Hemoglobin: 13.5 g/dL (ref 12.0–15.0)
Immature Granulocytes: 0 %
Lymphocytes Relative: 14 %
Lymphs Abs: 0.7 10*3/uL (ref 0.7–4.0)
MCH: 30.2 pg (ref 26.0–34.0)
MCHC: 33.8 g/dL (ref 30.0–36.0)
MCV: 89.5 fL (ref 80.0–100.0)
Monocytes Absolute: 0.3 10*3/uL (ref 0.1–1.0)
Monocytes Relative: 7 %
Neutro Abs: 3.5 10*3/uL (ref 1.7–7.7)
Neutrophils Relative %: 75 %
Platelets: 178 10*3/uL (ref 150–400)
RBC: 4.47 MIL/uL (ref 3.87–5.11)
RDW: 12.8 % (ref 11.5–15.5)
WBC: 4.6 10*3/uL (ref 4.0–10.5)
nRBC: 0 % (ref 0.0–0.2)

## 2022-06-14 LAB — URINALYSIS, ROUTINE W REFLEX MICROSCOPIC
Bilirubin Urine: NEGATIVE
Glucose, UA: NEGATIVE mg/dL
Hgb urine dipstick: NEGATIVE
Ketones, ur: NEGATIVE mg/dL
Nitrite: NEGATIVE
Protein, ur: 30 mg/dL — AB
Specific Gravity, Urine: 1.025 (ref 1.005–1.030)
WBC, UA: >50 WBC/hpf — ABNORMAL HIGH (ref 0–5)
pH: 5 (ref 5.0–8.0)

## 2022-06-14 LAB — COMPREHENSIVE METABOLIC PANEL
ALT: 8 U/L (ref 0–44)
AST: 12 U/L — ABNORMAL LOW (ref 15–41)
Albumin: 3.6 g/dL (ref 3.5–5.0)
Alkaline Phosphatase: 57 U/L (ref 38–126)
Anion gap: 5 (ref 5–15)
BUN: 18 mg/dL (ref 8–23)
CO2: 26 mmol/L (ref 22–32)
Calcium: 9.2 mg/dL (ref 8.9–10.3)
Chloride: 107 mmol/L (ref 98–111)
Creatinine, Ser: 0.8 mg/dL (ref 0.44–1.00)
GFR, Estimated: >60 mL/min (ref >60)
Glucose, Bld: 101 mg/dL — ABNORMAL HIGH (ref 70–99)
Potassium: 3.5 mmol/L (ref 3.5–5.1)
Sodium: 138 mmol/L (ref 135–145)
Total Bilirubin: 1.6 mg/dL — ABNORMAL HIGH (ref 0.3–1.2)
Total Protein: 6.9 g/dL (ref 6.5–8.1)

## 2022-06-14 LAB — TSH: TSH: 11.873 u[IU]/mL — ABNORMAL HIGH (ref 0.350–4.500)

## 2022-06-14 MED ORDER — SODIUM CHLORIDE 0.9 % IV SOLN: Freq: Once | INTRAVENOUS | Status: AC

## 2022-06-14 MED ORDER — SODIUM CHLORIDE 0.9% FLUSH
10.0000 mL | INTRAVENOUS | Status: DC | PRN
  Administered 2022-06-14: 10 mL

## 2022-06-14 MED ORDER — SODIUM CHLORIDE 0.9 % IV SOLN
10.0000 mg/kg | Freq: Once | INTRAVENOUS | Status: AC
  Administered 2022-06-14: 860 mg via INTRAVENOUS
  Filled 2022-06-14: qty 10

## 2022-06-14 MED ORDER — HEPARIN SOD (PORK) LOCK FLUSH 100 UNIT/ML IV SOLN
500.0000 [IU] | Freq: Once | INTRAVENOUS | Status: AC | PRN
  Administered 2022-06-14: 500 [IU]

## 2022-06-14 NOTE — Patient Instructions (Signed)
[facility]  Discharge Instructions: Thank you for choosing [facility] to provide your oncology and hematology care.  If you have a lab appointment with the [facility], please come in thru the Main Entrance and check in at the main information desk.  Wear comfortable clothing and clothing appropriate for easy access to any Portacath or PICC line.   We strive to give you quality time with your provider. You may need to reschedule your appointment if you arrive late (15 or more minutes).  Arriving late affects you and other patients whose appointments are after yours.  Also, if you miss three or more appointments without notifying the office, you may be dismissed from the clinic at the provider's discretion.      For prescription refill requests, have your pharmacy contact our office and allow 72 hours for refills to be completed.    Today you received the following chemotherapy and/or immunotherapy agents Imfinzi.  Durvalumab Injection What is this medication? DURVALUMAB (dur VAL ue mab) treats some types of cancer. It works by helping your immune system slow or stop the spread of cancer cells. It is a monoclonal antibody. This medicine may be used for other purposes; ask your health care provider or pharmacist if you have questions. COMMON BRAND NAME(S): IMFINZI What should I tell my care team before I take this medication? They need to know if you have any of these conditions: Allogeneic stem cell transplant (uses someone else's stem cells) Autoimmune diseases, such as Crohn disease, ulcerative colitis, lupus History of chest radiation Nervous system problems, such as Guillain-Barre syndrome, myasthenia gravis Organ transplant An unusual or allergic reaction to durvalumab, other medications, foods, dyes, or preservatives Pregnant or trying to get pregnant Breast-feeding How should I use this medication? This medication is infused into a vein. It is  given by your care team in a hospital or clinic setting. A special MedGuide will be given to you before each treatment. Be sure to read this information carefully each time. Talk to your care team about the use of this medication in children. Special care may be needed. Overdosage: If you think you have taken too much of this medicine contact a poison control center or emergency room at once. NOTE: This medicine is only for you. Do not share this medicine with others. What if I miss a dose? Keep appointments for follow-up doses. It is important not to miss your dose. Call your care team if you are unable to keep an appointment. What may interact with this medication? Interactions have not been studied. This list may not describe all possible interactions. Give your health care provider a list of all the medicines, herbs, non-prescription drugs, or dietary supplements you use. Also tell them if you smoke, drink alcohol, or use illegal drugs. Some items may interact with your medicine. What should I watch for while using this medication? Your condition will be monitored carefully while you are receiving this medication. You may need blood work while taking this medication. This medication may cause serious skin reactions. They can happen weeks to months after starting the medication. Contact your care team right away if you notice fevers or flu-like symptoms with a rash. The rash may be red or purple and then turn into blisters or peeling of the skin. You may also notice a red rash with swelling of the face, lips, or lymph nodes in your neck or under your arms. Tell your care team right away  if you have any change in your eyesight. Talk to your care team if you may be pregnant. Serious birth defects can occur if you take this medication during pregnancy and for 3 months after the last dose. You will need a negative pregnancy test before starting this medication. Contraception is recommended while taking  this medication and for 3 months after the last dose. Your care team can help you find the option that works for you. Do not breastfeed while taking this medication and for 3 months after the last dose. What side effects may I notice from receiving this medication? Side effects that you should report to your care team as soon as possible: Allergic reactions--skin rash, itching, hives, swelling of the face, lips, tongue, or throat Dry cough, shortness of breath or trouble breathing Eye pain, redness, irritation, or discharge with blurry or decreased vision Heart muscle inflammation--unusual weakness or fatigue, shortness of breath, chest pain, fast or irregular heartbeat, dizziness, swelling of the ankles, feet, or hands Hormone gland problems--headache, sensitivity to light, unusual weakness or fatigue, dizziness, fast or irregular heartbeat, increased sensitivity to cold or heat, excessive sweating, constipation, hair loss, increased thirst or amount of urine, tremors or shaking, irritability Infusion reactions--chest pain, shortness of breath or trouble breathing, feeling faint or lightheaded Kidney injury (glomerulonephritis)--decrease in the amount of urine, red or dark Christion Leonhard urine, foamy or bubbly urine, swelling of the ankles, hands, or feet Liver injury--right upper belly pain, loss of appetite, nausea, light-colored stool, dark yellow or Jemari Hallum urine, yellowing skin or eyes, unusual weakness or fatigue Pain, tingling, or numbness in the hands or feet, muscle weakness, change in vision, confusion or trouble speaking, loss of balance or coordination, trouble walking, seizures Rash, fever, and swollen lymph nodes Redness, blistering, peeling, or loosening of the skin, including inside the mouth Sudden or severe stomach pain, bloody diarrhea, fever, nausea, vomiting Side effects that usually do not require medical attention (report these to your care team if they continue or are  bothersome): Bone, joint, or muscle pain Diarrhea Fatigue Loss of appetite Nausea Skin rash This list may not describe all possible side effects. Call your doctor for medical advice about side effects. You may report side effects to FDA at 1-800-FDA-1088. Where should I keep my medication? This medication is given in a hospital or clinic. It will not be stored at home. NOTE: This sheet is a summary. It may not cover all possible information. If you have questions about this medicine, talk to your doctor, pharmacist, or health care provider.  2023 Elsevier/Gold Standard (2021-11-01 00:00:00)        To help prevent nausea and vomiting after your treatment, we encourage you to take your nausea medication as directed.  BELOW ARE SYMPTOMS THAT SHOULD BE REPORTED IMMEDIATELY: *FEVER GREATER THAN 100.4 F (38 C) OR HIGHER *CHILLS OR SWEATING *NAUSEA AND VOMITING THAT IS NOT CONTROLLED WITH YOUR NAUSEA MEDICATION *UNUSUAL SHORTNESS OF BREATH *UNUSUAL BRUISING OR BLEEDING *URINARY PROBLEMS (pain or burning when urinating, or frequent urination) *BOWEL PROBLEMS (unusual diarrhea, constipation, pain near the anus) TENDERNESS IN MOUTH AND THROAT WITH OR WITHOUT PRESENCE OF ULCERS (sore throat, sores in mouth, or a toothache) UNUSUAL RASH, SWELLING OR PAIN  UNUSUAL VAGINAL DISCHARGE OR ITCHING   Items with * indicate a potential emergency and should be followed up as soon as possible or go to the Emergency Department if any problems should occur.  Please show the CHEMOTHERAPY ALERT CARD or IMMUNOTHERAPY ALERT CARD at check-in to  the Emergency Department and triage nurse.  Should you have questions after your visit or need to cancel or reschedule your appointment, please contact [facility] [phone]  and follow the prompts.  Office hours are 8:00 a.m. to 4:30 p.m. Monday - Friday. Please note that voicemails left after 4:00 p.m. may not be returned until the following  business day.  We are closed weekends and major holidays. You have access to a nurse at all times for urgent questions. Please call the main number to the clinic [phone] and follow the prompts.  For any non-urgent questions, you may also contact your provider using MyChart. We now offer e-Visits for anyone 8 and older to request care online for non-urgent symptoms. For details visit [email or web address].   Also download the MyChart app! Go to the app store, search "MyChart", open the app, select [facility], and log in with your MyChart username and password.  Masks are optional in the cancer centers. If you would like for your care team to wear a mask while they are taking care of you, please let them know. You may have one support person who is at least 69 years old accompany you for your appointments.

## 2022-06-14 NOTE — Progress Notes (Signed)
Patient presents today for chemotherapy infusion.  Patient is in satisfactory condition with only complaints of dysuria and low back pain.  MD made aware.  Vital signs are stable.  Labs reviewed.  Bilirubin today is 1.6.  MD made aware.  All other labs are within treatment parameters.  We will proceed with treatment per MD orders.   We will collect a urine specimen today for UA and urine culture per Dr. Delton Coombes.   Patient tolerated treatment well with no complaints voiced.  Patient left ambulatory in stable condition.  Vital signs stable at discharge.  Follow up as scheduled.

## 2022-06-15 ENCOUNTER — Other Ambulatory Visit (HOSPITAL_COMMUNITY): Payer: Self-pay

## 2022-06-15 ENCOUNTER — Other Ambulatory Visit: Payer: Self-pay | Admitting: *Deleted

## 2022-06-15 MED ORDER — CIPROFLOXACIN HCL 500 MG PO TABS: 500.0000 mg | ORAL_TABLET | Freq: Two times a day (BID) | ORAL | 0 refills | Status: AC

## 2022-06-15 MED ORDER — CIPROFLOXACIN HCL 500 MG PO TABS
500.0000 mg | ORAL_TABLET | Freq: Two times a day (BID) | ORAL | 0 refills | Status: DC
  Filled 2022-06-15: qty 10, 5d supply, fill #0

## 2022-06-15 MED ORDER — HYDROCODONE-ACETAMINOPHEN 7.5-325 MG/15ML PO SOLN
15.0000 mL | Freq: Four times a day (QID) | ORAL | 0 refills | Status: DC | PRN
  Filled 2022-06-15: qty 608, 10d supply, fill #0

## 2022-06-15 NOTE — Progress Notes (Signed)
Patient's UA indicates UTI, along with symptoms of chafing and burning with urination.  Per Dr. Delton Coombes, will send in Cipro 500 mg bid x 5 days.  Patient made aware.

## 2022-06-16 LAB — URINE CULTURE

## 2022-06-17 ENCOUNTER — Other Ambulatory Visit (HOSPITAL_COMMUNITY): Payer: Self-pay

## 2022-06-23 ENCOUNTER — Other Ambulatory Visit: Payer: Self-pay

## 2022-06-28 ENCOUNTER — Other Ambulatory Visit: Payer: Self-pay

## 2022-06-28 DIAGNOSIS — C3490 Malignant neoplasm of unspecified part of unspecified bronchus or lung: Secondary | ICD-10-CM

## 2022-06-29 ENCOUNTER — Inpatient Hospital Stay (HOSPITAL_BASED_OUTPATIENT_CLINIC_OR_DEPARTMENT_OTHER): Payer: Medicare HMO | Admitting: Hematology

## 2022-06-29 ENCOUNTER — Inpatient Hospital Stay: Payer: Medicare HMO | Attending: Hematology and Oncology

## 2022-06-29 ENCOUNTER — Inpatient Hospital Stay: Payer: Medicare HMO

## 2022-06-29 VITALS — Wt 184.5 lb

## 2022-06-29 VITALS — BP 121/69 | HR 83 | Temp 97.7°F | Resp 20

## 2022-06-29 VITALS — BP 103/59 | HR 80 | Temp 98.5°F | Resp 18

## 2022-06-29 DIAGNOSIS — E876 Hypokalemia: Secondary | ICD-10-CM | POA: Diagnosis not present

## 2022-06-29 DIAGNOSIS — Z5112 Encounter for antineoplastic immunotherapy: Secondary | ICD-10-CM | POA: Diagnosis not present

## 2022-06-29 DIAGNOSIS — R072 Precordial pain: Secondary | ICD-10-CM | POA: Diagnosis not present

## 2022-06-29 DIAGNOSIS — Z825 Family history of asthma and other chronic lower respiratory diseases: Secondary | ICD-10-CM | POA: Insufficient documentation

## 2022-06-29 DIAGNOSIS — E039 Hypothyroidism, unspecified: Secondary | ICD-10-CM | POA: Diagnosis not present

## 2022-06-29 DIAGNOSIS — C3490 Malignant neoplasm of unspecified part of unspecified bronchus or lung: Secondary | ICD-10-CM

## 2022-06-29 DIAGNOSIS — Z833 Family history of diabetes mellitus: Secondary | ICD-10-CM | POA: Diagnosis not present

## 2022-06-29 DIAGNOSIS — D696 Thrombocytopenia, unspecified: Secondary | ICD-10-CM | POA: Insufficient documentation

## 2022-06-29 DIAGNOSIS — J029 Acute pharyngitis, unspecified: Secondary | ICD-10-CM | POA: Diagnosis not present

## 2022-06-29 DIAGNOSIS — C321 Malignant neoplasm of supraglottis: Secondary | ICD-10-CM | POA: Insufficient documentation

## 2022-06-29 DIAGNOSIS — Z923 Personal history of irradiation: Secondary | ICD-10-CM | POA: Diagnosis not present

## 2022-06-29 DIAGNOSIS — Z808 Family history of malignant neoplasm of other organs or systems: Secondary | ICD-10-CM | POA: Insufficient documentation

## 2022-06-29 DIAGNOSIS — Z818 Family history of other mental and behavioral disorders: Secondary | ICD-10-CM | POA: Insufficient documentation

## 2022-06-29 DIAGNOSIS — Z9049 Acquired absence of other specified parts of digestive tract: Secondary | ICD-10-CM | POA: Diagnosis not present

## 2022-06-29 DIAGNOSIS — K5900 Constipation, unspecified: Secondary | ICD-10-CM | POA: Insufficient documentation

## 2022-06-29 DIAGNOSIS — Z8249 Family history of ischemic heart disease and other diseases of the circulatory system: Secondary | ICD-10-CM | POA: Diagnosis not present

## 2022-06-29 DIAGNOSIS — M25569 Pain in unspecified knee: Secondary | ICD-10-CM | POA: Insufficient documentation

## 2022-06-29 DIAGNOSIS — Z79811 Long term (current) use of aromatase inhibitors: Secondary | ICD-10-CM | POA: Insufficient documentation

## 2022-06-29 DIAGNOSIS — R197 Diarrhea, unspecified: Secondary | ICD-10-CM | POA: Insufficient documentation

## 2022-06-29 DIAGNOSIS — C3432 Malignant neoplasm of lower lobe, left bronchus or lung: Secondary | ICD-10-CM | POA: Diagnosis present

## 2022-06-29 DIAGNOSIS — R0602 Shortness of breath: Secondary | ICD-10-CM | POA: Diagnosis not present

## 2022-06-29 DIAGNOSIS — Z87891 Personal history of nicotine dependence: Secondary | ICD-10-CM | POA: Insufficient documentation

## 2022-06-29 DIAGNOSIS — I3139 Other pericardial effusion (noninflammatory): Secondary | ICD-10-CM | POA: Diagnosis not present

## 2022-06-29 DIAGNOSIS — Z170 Estrogen receptor positive status [ER+]: Secondary | ICD-10-CM | POA: Diagnosis not present

## 2022-06-29 DIAGNOSIS — C50412 Malignant neoplasm of upper-outer quadrant of left female breast: Secondary | ICD-10-CM | POA: Diagnosis not present

## 2022-06-29 DIAGNOSIS — Z79899 Other long term (current) drug therapy: Secondary | ICD-10-CM | POA: Diagnosis not present

## 2022-06-29 DIAGNOSIS — Z8719 Personal history of other diseases of the digestive system: Secondary | ICD-10-CM | POA: Diagnosis not present

## 2022-06-29 DIAGNOSIS — R070 Pain in throat: Secondary | ICD-10-CM | POA: Insufficient documentation

## 2022-06-29 DIAGNOSIS — Z95828 Presence of other vascular implants and grafts: Secondary | ICD-10-CM

## 2022-06-29 DIAGNOSIS — Z800 Family history of malignant neoplasm of digestive organs: Secondary | ICD-10-CM | POA: Insufficient documentation

## 2022-06-29 DIAGNOSIS — Z8051 Family history of malignant neoplasm of kidney: Secondary | ICD-10-CM | POA: Insufficient documentation

## 2022-06-29 DIAGNOSIS — Z801 Family history of malignant neoplasm of trachea, bronchus and lung: Secondary | ICD-10-CM | POA: Insufficient documentation

## 2022-06-29 DIAGNOSIS — Z8041 Family history of malignant neoplasm of ovary: Secondary | ICD-10-CM | POA: Insufficient documentation

## 2022-06-29 LAB — CBC WITH DIFFERENTIAL/PLATELET
Abs Immature Granulocytes: 0.02 10*3/uL (ref 0.00–0.07)
Basophils Absolute: 0 10*3/uL (ref 0.0–0.1)
Basophils Relative: 1 %
Eosinophils Absolute: 0.1 10*3/uL (ref 0.0–0.5)
Eosinophils Relative: 3 %
HCT: 39.9 % (ref 36.0–46.0)
Hemoglobin: 13.2 g/dL (ref 12.0–15.0)
Immature Granulocytes: 0 %
Lymphocytes Relative: 13 %
Lymphs Abs: 0.6 10*3/uL — ABNORMAL LOW (ref 0.7–4.0)
MCH: 29.9 pg (ref 26.0–34.0)
MCHC: 33.1 g/dL (ref 30.0–36.0)
MCV: 90.3 fL (ref 80.0–100.0)
Monocytes Absolute: 0.3 10*3/uL (ref 0.1–1.0)
Monocytes Relative: 6 %
Neutro Abs: 3.7 10*3/uL (ref 1.7–7.7)
Neutrophils Relative %: 77 %
Platelets: 169 10*3/uL (ref 150–400)
RBC: 4.42 MIL/uL (ref 3.87–5.11)
RDW: 12.7 % (ref 11.5–15.5)
WBC: 4.8 10*3/uL (ref 4.0–10.5)
nRBC: 0 % (ref 0.0–0.2)

## 2022-06-29 LAB — COMPREHENSIVE METABOLIC PANEL
ALT: 8 U/L (ref 0–44)
AST: 12 U/L — ABNORMAL LOW (ref 15–41)
Albumin: 3.5 g/dL (ref 3.5–5.0)
Alkaline Phosphatase: 62 U/L (ref 38–126)
Anion gap: 7 (ref 5–15)
BUN: 17 mg/dL (ref 8–23)
CO2: 25 mmol/L (ref 22–32)
Calcium: 9.1 mg/dL (ref 8.9–10.3)
Chloride: 106 mmol/L (ref 98–111)
Creatinine, Ser: 0.78 mg/dL (ref 0.44–1.00)
GFR, Estimated: >60 mL/min (ref >60)
Glucose, Bld: 112 mg/dL — ABNORMAL HIGH (ref 70–99)
Potassium: 3.8 mmol/L (ref 3.5–5.1)
Sodium: 138 mmol/L (ref 135–145)
Total Bilirubin: 1.5 mg/dL — ABNORMAL HIGH (ref 0.3–1.2)
Total Protein: 6.9 g/dL (ref 6.5–8.1)

## 2022-06-29 LAB — TSH: TSH: 15.379 u[IU]/mL — ABNORMAL HIGH (ref 0.350–4.500)

## 2022-06-29 LAB — MAGNESIUM: Magnesium: 2.2 mg/dL (ref 1.7–2.4)

## 2022-06-29 MED ORDER — SODIUM CHLORIDE 0.9% FLUSH
10.0000 mL | INTRAVENOUS | Status: DC | PRN
  Administered 2022-06-29: 10 mL via INTRAVENOUS

## 2022-06-29 MED ORDER — HEPARIN SOD (PORK) LOCK FLUSH 100 UNIT/ML IV SOLN
500.0000 [IU] | Freq: Once | INTRAVENOUS | Status: AC | PRN
  Administered 2022-06-29: 500 [IU]

## 2022-06-29 MED ORDER — LEVOTHYROXINE SODIUM 50 MCG PO TABS: 50.0000 ug | ORAL_TABLET | Freq: Every day | ORAL | 2 refills | Status: DC

## 2022-06-29 MED ORDER — SODIUM CHLORIDE 0.9 % IV SOLN: Freq: Once | INTRAVENOUS | Status: AC

## 2022-06-29 MED ORDER — SODIUM CHLORIDE 0.9% FLUSH
10.0000 mL | INTRAVENOUS | Status: DC | PRN
  Administered 2022-06-29: 10 mL

## 2022-06-29 MED ORDER — SODIUM CHLORIDE 0.9 % IV SOLN
10.0000 mg/kg | Freq: Once | INTRAVENOUS | Status: AC
  Administered 2022-06-29: 860 mg via INTRAVENOUS
  Filled 2022-06-29: qty 10

## 2022-06-29 NOTE — Progress Notes (Signed)
Pt presents today for Imfinzi per provider's order. Vital signs and labs WNL for treatment. Okay to proceed with treatment today per Dr.K.  Imfinzi given today per MD orders. Tolerated infusion without adverse affects. Vital signs stable. No complaints at this time. Discharged from clinic ambulatory in stable condition. Alert and oriented x 3. F/U with [facility] as scheduled.

## 2022-06-29 NOTE — Progress Notes (Signed)
Patients port flushed without difficulty.  Good blood return noted with no bruising or swelling noted at site.  Patient remains accessed for chemotherapy treatment.  

## 2022-06-29 NOTE — Progress Notes (Signed)
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    06/24/2014 Breast US   Left breast: ill-defined predominantly hypoechoic area with some increased echogenicity in the 2 o'clock position of the left breast, 7 cm from the nipple. 2.6 x 2.3 x 1.8 cm in maximum dimensions.   07/01/2014 Initial Biopsy   Left breast core needle bx: Invasive mammary carcinoma with lobular features, ER+ (100%), PR+ (91%), HER2/neu negative (ratio 1.09), Ki-67 32%. E-cadherin strongly positive, diagnostic for IDC    07/01/2014 Clinical Stage   Stage IIA: T2 N0   08/20/2014 Definitive Surgery   Left breast seed localized lumpectomy/SLNB: IDC, +LVI, DCIS, 1 LN removed and positive for metastatic carcinoma. Grade 2. HER2/neu repeated and negative (ratio 0.69-1.9).    08/20/2014 Pathologic Stage   Stage IIB: pT2 pN1a pMx   09/17/2014 Surgery   Port-a cath placement and re-excision   10/02/2014 - 01/15/2015 Chemotherapy   CMF x 6 cycles.  patient refused Taxane-containing chemotherapy.   03/07/2015 Procedure   Comp Cancer Panel reveals no variant at APC, ATM, AXIN2, BARD1, BMPR1A, BRCA1, BRCA2, BRIP1, CDH1, CDK4, CDKN2A, CHEK2, FANCC, MLH1, MSH2, MSH6, MUTYH, NBN, PALB2, PMS2, POLD1, POLE, PTEN, RAD51C, RAD51D, SMAD4, STK11, TP53, VHL, and XRCC2.    04/06/2015 - 05/21/2015 Radiation Therapy    Adjuvant RT Pablo Ledger): Left breast/ 45 Gy over 25 fractions.   Left supraclavicular fossa and axilla/ 45 Gy over 25 fractions. Left breast boost/ 16 Gy over 8 fractions. Total dose: 61 Gy   06/04/2015 - 08/07/2015 Anti-estrogen oral therapy   Exemestane 25 mg daily.  Planned duration of therapy 5 years.    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  PET also demonstrated borderline right level 2 lymph node left supraclavicular, mediastinal and hilar lymphadenopathy and hypermetabolic left lower lobe pulmonary nodule all suspicious of metastatic focus.  No abdominal pelvic metastatic disease or osseous metastatic disease was appreciated.   08/31/2021 Initial Diagnosis   Squamous cell carcinoma of lung ([place])   09/08/2021 Cancer Staging   Staging form: Lung, AJCC 8th Edition -  Clinical stage from 09/08/2021: Stage IIIB (cT1b, cN3, cM0) - Signed by Eppie Gibson, MD on 09/08/2021 Stage prefix: Initial diagnosis   09/30/2021 Pathology Results   [facility] 360 Pathology PDL 1 29%   11/01/2021 - 11/23/2021 Chemotherapy   Patient is on Treatment Plan : LUNG Carboplatin / Paclitaxel + XRT q7d     02/01/2022 - 03/15/2022 Chemotherapy   Patient  is on Treatment Plan : LUNG Durvalumab q14d     02/01/2022 -  Chemotherapy   Patient is on Treatment Plan : LUNG Durvalumab (10) q14d       CANCER STAGING:  Cancer Staging  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 ASHIKA APUZZO, a 71 y.o. female, seen for follow-up and toxicity assessment prior to next cycle of immunotherapy for lung cancer.  She reports energy level 75%.  Chronic hip and knee pains are stable.  Denies any immunotherapy related side effects.  REVIEW OF SYSTEMS:  Review of Systems  HENT:   Positive for sore throat.   Respiratory:  Positive for shortness of breath (On exertion).   Gastrointestinal:  Positive for diarrhea (Only on the day of treatment).  All other systems reviewed and are negative.   PAST MEDICAL/SURGICAL HISTORY:  Past Medical History:  Diagnosis Date   Arthritis    Asthma    Asymptomatic varicose veins    Breast cancer ([place]) 06/2014   left   Cancer ([place])    Supra Glottis   Chronic airway obstruction ([facility])    Complication of anesthesia 2016   difficulty waking up, Oxygen dropped low.   COPD (chronic obstructive pulmonary disease) ([facility])    Depression    Dyspnea    GERD (gastroesophageal reflux disease)    Hemorrhage rectum    and anus.   Hypertension    Insomnia    Other symptoms involving digestive system(787.99)    Pain    Hx.pain in joint involving pelvic region and thigh; pain in limb    Palpitations    Panic attacks    Personal history of chemotherapy 2016   Personal history of radiation therapy 2016   Pneumonia    Pre-diabetes    Prediabetes    Sinusitis    Symptomatic menopausal or female climacteric states    Varicose veins of both lower extremities with pain    Past Surgical History:  Procedure Laterality Date   BREAST BIOPSY Left 10/2012   BREAST BIOPSY Left 07/01/2014   malignant   BREAST LUMPECTOMY Left 08/20/2014   BRONCHIAL BIOPSY  08/23/2021   Procedure: BRONCHIAL BIOPSIES;  Surgeon: Collene Gobble, MD;  Location: [facility] ENDOSCOPY;  Service: Pulmonary;;   BRONCHIAL BRUSHINGS  08/23/2021   Procedure: BRONCHIAL BRUSHINGS;  Surgeon: Collene Gobble, MD;  Location: [facility] ENDOSCOPY;  Service: Pulmonary;;   BRONCHIAL NEEDLE ASPIRATION BIOPSY  08/23/2021   Procedure: BRONCHIAL NEEDLE ASPIRATION BIOPSIES;  Surgeon: Collene Gobble, MD;  Location: [facility] ENDOSCOPY;  Service: Pulmonary;;   BRONCHIAL WASHINGS  08/23/2021  Procedure: BRONCHIAL WASHINGS;  Surgeon: Collene Gobble, MD;  Location: [facility] ENDOSCOPY;  Service: Pulmonary;;   CESAREAN SECTION     CHOLECYSTECTOMY  1985   COLONOSCOPY  2002   Dr. Lindalou Hose: internal hemorrhoids, one small rectal polyp, adenomatous path    COLONOSCOPY N/A 11/23/2015   Procedure: COLONOSCOPY;  Surgeon: Danie Binder, MD;  Location: [facility] ENDO SU[facility];  Service: Endoscopy;  Laterality: N/A;  1100 - moved to 10:45 - office to notify   ESOPHAGOGASTRODUODENOSCOPY N/A 11/23/2015   Procedure: ESOPHAGOGASTRODUODENOSCOPY (EGD);  Surgeon: Danie Binder, MD;  [facility]: [facility];  Service: Endoscopy;  Laterality: N/A;   IR IMAGING GUIDED PORT INSERTION  11/08/2021   PORT-A-CATH REMOVAL  02/2015   PORTACATH PLACEMENT Right 09/17/2014   Procedure: INSERTION PORT-A-CATH WITH ULTRASOUND;  Surgeon: Erroll Luna, MD;  Location: [facility];  Service: General;  Laterality: Right;  IJ   RADIOACTIVE SEED GUIDED PARTIAL MASTECTOMY WITH AXILLARY  SENTINEL LYMPH NODE BIOPSY Left 08/20/2014   Procedure: LEFT BREAST LUMPECTOMY WITH RADIOACTIVE SEED LOCALIZATION AND SENTINEL LYMPH NODE MAPPING;  Surgeon: Erroll Luna, MD;  Location: [facility];  Service: General;  Laterality: Left;   RE-EXCISION OF BREAST LUMPECTOMY Left 09/17/2014   Procedure: RE-EXCISION OF BREAST LUMPECTOMY;  Surgeon: Erroll Luna, MD;  Location: [facility];  Service: General;  Laterality: Left;   TOTAL HIP ARTHROPLASTY Right 02/22/2021   Procedure: RIGHT TOTAL HIP ARTHROPLASTY ANTERIOR APPROACH;  Surgeon: Marybelle Killings, MD;  Location: [facility];  Service: Orthopedics;  Laterality: Right;   TUBAL LIGATION     VIDEO BRONCHOSCOPY WITH ENDOBRONCHIAL ULTRASOUND N/A 08/23/2021   Procedure: VIDEO BRONCHOSCOPY WITH ENDOBRONCHIAL ULTRASOUND;  Surgeon: Collene Gobble, MD;  Location: [facility] ENDOSCOPY;  Service: Pulmonary;  Laterality: N/A;   VIDEO BRONCHOSCOPY WITH RADIAL ENDOBRONCHIAL ULTRASOUND  08/23/2021   Procedure: VIDEO BRONCHOSCOPY WITH RADIAL ENDOBRONCHIAL ULTRASOUND;  Surgeon: Collene Gobble, MD;  Location: [facility] ENDOS[facility];  Service: Pulmonary;;    SOCIAL HISTORY:  Social History   Socioeconomic History   Marital status: Divorced    Spouse name: Not on file   Number of children: 2   Years of education: Not on file   Highest education level: Not on file  Occupational History   Not on file  Tobacco Use   Smoking status: Former    Packs/day: 1.00    Years: 42.00    Total pack years: 42.00    Types: Cigarettes    Quit date: 02/22/2021    Years since quitting: 1.3   Smokeless tobacco: Never  Vaping Use   Vaping Use: Never used  Substance and Sexual Activity   Alcohol use: No    Alcohol/week: 0.0 standard drinks of alcohol   Drug use: No   Sexual activity: Never    Birth control/protection: None  Other Topics Concern   Not on file  Social History Narrative   Not on file   Social Determinants of Health   Financial Resource Strain: Not  on file  Food Insecurity: Not on file  Transportation Needs: Not on file  Physical Activity: Not on file  Stress: Not on file  Social Connections: Unknown (08/05/2021)   Social Connection and Isolation Panel [NHANES]    Frequency of Communication with Friends and Family: More than three times a week    Frequency of Social Gatherings with Friends and Family: More than three times a week    Attends Religious Services: Not on file    Active  Member of Clubs or Organizations: Not on file    Attends Archivist Meetings: Not on file    Marital Status: Not on file  Intimate Partner Violence: Not on file    FAMILY HISTORY:  Family History  Problem Relation Age of Onset   Diabetes Mother    Ovarian cancer Mother 14   Other Father        died in MVA at age 80   Other Sister        mitral valve disorder   Melanoma Sister 41       removed from back of leg   Colon cancer Brother        early 68s, succumbed to disease   Cancer Paternal Aunt        leukemia, bone, or lung cancer   Alzheimer's disease Maternal Grandmother    Heart attack Maternal Grandfather    Heart attack Paternal Grandmother    COPD Paternal Grandfather    Emphysema Paternal Grandfather    Congestive Heart Failure Paternal Aunt    Stomach cancer Paternal Uncle    Kidney cancer Maternal Uncle    Cancer Cousin        dx. teens/dx. 57s   Cancer Cousin    Colon cancer Cousin     CURRENT MEDICATIONS:  Current Outpatient Medications  Medication Sig Dispense Refill   albuterol (PROVENTIL HFA;VENTOLIN HFA) 108 (90 BASE) MCG/ACT inhaler Inhale 2 puffs into the lungs every 6 (six) hours as needed for wheezing or shortness of breath.      ALPRAZolam (XANAX) 0.5 MG tablet Take 0.5 mg by mouth 3 (three) times daily.     ANORO ELLIPTA 62.5-25 MCG/INH AEPB Inhale 1 puff into the lungs daily.     calcium carbonate (TUMS - DOSED IN MG ELEMENTAL CALCIUM) 500 MG chewable tablet Chew 2 tablets by mouth 3 (three) times daily  as needed for indigestion or heartburn.     fluconazole (DIFLUCAN) 100 MG tablet Take 2 tablets today, then 1 tablet daily x 20 more days. 22 tablet 0   furosemide (LASIX) 20 MG tablet Take 20 mg by mouth daily.     hydrochlorothiazide (HYDRODIURIL) 12.5 MG tablet Take 12.5 mg by mouth daily.     HYDROcodone-acetaminophen (HYCET) 7.5-325 mg/15 ml solution Take 15 mLs by mouth every 6 (six) hours as needed for moderate pain. 1680 mL 0   ibuprofen (ADVIL) 100 MG/5ML suspension Take 10 mLs (200 mg total) by mouth every 4 (four) hours as needed for moderate pain. 473 mL 0   lidocaine (XYLOCAINE) 2 % solution Patient: Mix 1part 2% viscous lidocaine, 1part H20. Swallow 71m of diluted mixture, 326m before meals and at bedtime, up to QID for sore throat. 200 mL 3   lidocaine-prilocaine (EMLA) cream Apply 1 application. topically as needed. 30 g 0   Menthol, Topical Analgesic, (BIOFREEZE EX) Apply 1 application topically daily as needed (pain).     nystatin (MYCOSTATIN) 100000 UNIT/ML suspension Take 5 mLs (500,000 Units total) by mouth 4 (four) times daily. 60 mL 0   ondansetron (ZOFRAN) 8 MG tablet Take 1 tablet (8 mg total) by mouth every 8 (eight) hours as needed for nausea or vomiting. 60 tablet 0   pantoprazole (PROTONIX) 40 MG tablet Take 1 tablet by mouth once daily 90 tablet 0   polyethylene glycol (MIRALAX / GLYCOLAX) 17 g packet Take 17 g by mouth daily.     Potassium Chloride 40 MEQ/15ML (20%) SOLN Take 20 mEq by  mouth 2 (two) times daily. 473 mL 0   prochlorperazine (COMPAZINE) 10 MG tablet Take 1 tablet (10 mg total) by mouth every 6 (six) hours as needed for nausea or vomiting. 60 tablet 0   sertraline (ZOLOFT) 100 MG tablet Take 100 mg by mouth daily.     sucralfate (CARAFATE) 1 g tablet Dissolve 1 tablet in 10 mL H20 and swallow 30 min prior to meals and bedtime. 40 tablet 3   Vitamin D, Ergocalciferol, 50000 units CAPS Take 1 capsule by mouth once a week.     No current  facility-administered medications for this visit.    ALLERGIES:  No Known Allergies  PHYSICAL EXAM:  Performance status (ECOG): 1 - Symptomatic but completely ambulatory  There were no vitals filed for this visit.  Wt Readings from Last 3 Encounters:  06/14/22 185 lb 12.8 oz (84.3 kg)  06/08/22 183 lb 8 oz (83.2 kg)  05/25/22 183 lb 4.8 oz (83.1 kg)   Physical Exam Vitals reviewed.  Constitutional:      Appearance: Normal appearance. She is obese.  Cardiovascular:     Rate and Rhythm: Normal rate and regular rhythm.     Pulses: Normal pulses.     Heart sounds: Normal heart sounds.  Pulmonary:     Effort: Pulmonary effort is normal.     Breath sounds: Normal breath sounds.  Neurological:     General: No focal deficit present.     Mental Status: She is alert and oriented to person, place, and time.  Psychiatric:        Mood and Affect: Mood normal.        Behavior: Behavior normal.    LABORATORY DATA:  I have reviewed the labs as listed.     Latest Ref Rng & Units 06/14/2022   11:53 AM 06/02/2022   12:20 PM 05/19/2022   11:56 AM  CBC  WBC 4.0 - 10.5 K/uL 4.6  4.8  5.9   Hemoglobin 12.0 - 15.0 g/dL 13.5  12.9  13.8   Hematocrit 36.0 - 46.0 % 40.0  38.9  40.8   Platelets 150 - 400 K/uL 178  171  191       Latest Ref Rng & Units 06/14/2022   11:53 AM 06/02/2022   12:20 PM 05/19/2022   11:56 AM  CMP  Glucose 70 - 99 mg/dL 101  115  94   BUN 8 - 23 mg/dL _0 Creatinine 0.44 - 1.00 mg/dL 0.80  0.83  0.89   Sodium 135 - 145 mmol/L 138  137  138   Potassium 3.5 - 5.1 mmol/L 3.5  4.1  3.5   Chloride 98 - 111 mmol/L 107  108  103   CO2 22 - 32 mmol/L _1 Calcium 8.9 - 10.3 mg/dL 9.2  8.9  9.4   Total Protein 6.5 - 8.1 g/dL 6.9  6.6  7.1   Total Bilirubin 0.3 - 1.2 mg/dL 1.6  1.1  1.7   Alkaline Phos 38 - 126 U/L 57  57  61   AST 15 - 41 U/L _2 ALT 0 - 44 U/L _3 DIAGNOSTIC IMAGING:  I have independently reviewed the scans and  discussed with the patient. No results found.   ASSESSMENT:  Stage III squamous cell cancer: - LLL lung mass FNA (08/23/2021): Malignant cells consistent with squamous  cell carcinoma. - Lymph node 4R and 7: Malignant cells consistent with NSCLC. - Chemoradiation therapy with weekly carboplatin and paclitaxel from 11/01/2021, week for chemotherapy on 11/23/2021.  XRT completed on 12/16/2021. - She missed further doses of chemotherapy secondary to thrombocytopenia. - PET scan (01/27/2022): Interval response to therapy with decreasing size of mediastinal lymph nodes.  No new disease.    Social/family history: - Seen today with her Sister Neoma Laming.  Patient lives by herself.  Quit smoking on 02/22/2021.  Smoked 2 packs/day for 40 years. - Mother died of ovarian cancer.  Brother died of colon cancer at age 16.  Sister had melanoma.  Paternal uncles had stomach cancer and lung cancer.  Paternal aunt had thyroid cancer.  Another paternal aunt had pancreatic cancer.  3.  Supraglottic squamous cell carcinoma (T1N1): - Definitive radiation from 08/16/2021 through 09/13/2021  PLAN:  Stage III squamous cell lung cancer: - CT chest (05/20/2022): Resolution of many enlarged lymph nodes in the mediastinum.  Precarinal lymph node is 50% decreased in size.  No new areas. - Very small pericardial effusion seen.  May be reactive in the setting of radiation. - Reviewed labs today which showed normal LFTs and CBC. - Proceed with Durvalumab every 2 weeks.  RTC 6 weeks for follow-up.  Will plan to repeat CT of the chest with contrast prior to next visit.   2.  Supraglottic squamous cell carcinoma (T1N1): - PET scan on 01/27/2022 with no evidence of disease. - Dr. Redmond Baseman of ENT recently did laryngeal exam which showed inflammation and prominent webbing of the anterior glottis with no evidence of tumor. - Will order CT soft tissue neck prior to next visit.  3.  Throat pain and sternal chest pain: - Continue hydrocodone 10  mg liquid every 6 hours as needed.  4.  Hypokalemia: - Continue potassium 20 mEq daily.  Potassium is normal.  5.  Constipation: - Continue stool softener and MiraLAX.  6.  Hypothyroidism: - TSH today is 15.3. - Will start her on Synthroid 50 mcg daily on an empty stomach. - Plan to repeat TSH in 6 weeks.    Orders placed this encounter:  No orders of the defined types were placed in this encounter.     Derek Jack, MD [place] [phone]

## 2022-06-29 NOTE — Patient Instructions (Signed)
[facility]  Discharge Instructions: Thank you for choosing [facility] to provide your oncology and hematology care.  If you have a lab appointment with the [facility], please come in thru the Main Entrance and check in at the main information desk.  Wear comfortable clothing and clothing appropriate for easy access to any Portacath or PICC line.   We strive to give you quality time with your provider. You may need to reschedule your appointment if you arrive late (15 or more minutes).  Arriving late affects you and other patients whose appointments are after yours.  Also, if you miss three or more appointments without notifying the office, you may be dismissed from the clinic at the provider's discretion.      For prescription refill requests, have your pharmacy contact our office and allow 72 hours for refills to be completed.    Today you received the following chemotherapy and/or immunotherapy agents Imfinzi    To help prevent nausea and vomiting after your treatment, we encourage you to take your nausea medication as directed.  BELOW ARE SYMPTOMS THAT SHOULD BE REPORTED IMMEDIATELY: *FEVER GREATER THAN 100.4 F (38 C) OR HIGHER *CHILLS OR SWEATING *NAUSEA AND VOMITING THAT IS NOT CONTROLLED WITH YOUR NAUSEA MEDICATION *UNUSUAL SHORTNESS OF BREATH *UNUSUAL BRUISING OR BLEEDING *URINARY PROBLEMS (pain or burning when urinating, or frequent urination) *BOWEL PROBLEMS (unusual diarrhea, constipation, pain near the anus) TENDERNESS IN MOUTH AND THROAT WITH OR WITHOUT PRESENCE OF ULCERS (sore throat, sores in mouth, or a toothache) UNUSUAL RASH, SWELLING OR PAIN  UNUSUAL VAGINAL DISCHARGE OR ITCHING   Items with * indicate a potential emergency and should be followed up as soon as possible or go to the Emergency Department if any problems should occur.  Please show the CHEMOTHERAPY ALERT CARD or IMMUNOTHERAPY ALERT CARD at check-in to the Emergency  Department and triage nurse.  Should you have questions after your visit or need to cancel or reschedule your appointment, please contact [facility] [phone]  and follow the prompts.  Office hours are 8:00 a.m. to 4:30 p.m. Monday - Friday. Please note that voicemails left after 4:00 p.m. may not be returned until the following business day.  We are closed weekends and major holidays. You have access to a nurse at all times for urgent questions. Please call the main number to the clinic [phone] and follow the prompts.  For any non-urgent questions, you may also contact your provider using MyChart. We now offer e-Visits for anyone 21 and older to request care online for non-urgent symptoms. For details visit [email or web address].   Also download the MyChart app! Go to the app store, search "MyChart", open the app, select [facility], and log in with your MyChart username and password.  Masks are optional in the cancer centers. If you would like for your care team to wear a mask while they are taking care of you, please let them know. You may have one support person who is at least 68 years old accompany you for your appointments.

## 2022-06-29 NOTE — Progress Notes (Signed)
Patient has been examined by Dr. Katragadda, and vital signs and labs have been reviewed. ANC, Creatinine, LFTs, hemoglobin, and platelets are within treatment parameters per M.D. - pt may proceed with treatment.  Primary RN and pharmacy notified.  

## 2022-06-29 NOTE — Patient Instructions (Addendum)
[place] at [facility] Discharge Instructions   You were seen and examined today by Dr. Delton Coombes.  He reviewed the results of your lab work. You thyroid number is remaining elevated. We sent a prescription to your pharmacy for Synthroid to help manage this. Take as prescribed.   We will proceed with your treatment today.  Return as scheduled.    Thank you for choosing [place] at [facility] to provide your oncology and hematology care.  To afford each patient quality time with our provider, please arrive at least 15 minutes before your scheduled appointment time.   If you have a lab appointment with the [facility] please come in thru the Main Entrance and check in at the main information desk.  You need to re-schedule your appointment should you arrive 10 or more minutes late.  We strive to give you quality time with our providers, and arriving late affects you and other patients whose appointments are after yours.  Also, if you no show three or more times for appointments you may be dismissed from the clinic at the providers discretion.     Again, thank you for choosing [facility].  Our hope is that these requests will decrease the amount of time that you wait before being seen by our physicians.       _____________________________________________________________  Should you have questions after your visit to [facility], please contact our office at [phone] and follow the prompts.  Our office hours are 8:00 a.m. and 4:30 p.m. Monday - Friday.  Please note that voicemails left after 4:00 p.m. may not be returned until the following business day.  We are closed weekends and major holidays.  You do have access to a nurse 24-7, just call the main number to the clinic [phone] and do not press any options, hold on the line and a nurse will answer the phone.    For prescription refill requests, have your  pharmacy contact our office and allow 72 hours.    Due to Covid, you will need to wear a mask upon entering the hospital. If you do not have a mask, a mask will be given to you at the Main Entrance upon arrival. For doctor visits, patients may have 1 support person age 80 or older with them. For treatment visits, patients can not have anyone with them due to social distancing guidelines and our immunocompromised population.

## 2022-07-04 ENCOUNTER — Other Ambulatory Visit: Payer: Self-pay

## 2022-07-05 ENCOUNTER — Other Ambulatory Visit (HOSPITAL_COMMUNITY): Payer: Self-pay

## 2022-07-05 ENCOUNTER — Other Ambulatory Visit: Payer: Self-pay | Admitting: *Deleted

## 2022-07-05 MED ORDER — HYDROCODONE-ACETAMINOPHEN 7.5-325 MG/15ML PO SOLN
15.0000 mL | Freq: Four times a day (QID) | ORAL | 0 refills | Status: DC | PRN
  Filled 2022-07-05: qty 1680, 28d supply, fill #0

## 2022-07-06 ENCOUNTER — Other Ambulatory Visit (HOSPITAL_COMMUNITY): Payer: Self-pay

## 2022-07-06 ENCOUNTER — Other Ambulatory Visit: Payer: Self-pay | Admitting: *Deleted

## 2022-07-06 MED ORDER — HYDROCODONE-ACETAMINOPHEN 7.5-325 MG/15ML PO SOLN: 15.0000 mL | Freq: Four times a day (QID) | ORAL | 0 refills | Status: DC | PRN

## 2022-07-08 ENCOUNTER — Other Ambulatory Visit (HOSPITAL_COMMUNITY): Payer: Self-pay

## 2022-07-11 ENCOUNTER — Other Ambulatory Visit: Payer: Self-pay

## 2022-07-11 DIAGNOSIS — C3490 Malignant neoplasm of unspecified part of unspecified bronchus or lung: Secondary | ICD-10-CM

## 2022-07-13 ENCOUNTER — Inpatient Hospital Stay: Payer: Medicare HMO

## 2022-07-13 VITALS — BP 124/72 | HR 73 | Temp 98.5°F | Wt 186.8 lb

## 2022-07-13 VITALS — BP 121/75 | HR 69 | Temp 97.9°F | Resp 18

## 2022-07-13 DIAGNOSIS — J029 Acute pharyngitis, unspecified: Secondary | ICD-10-CM | POA: Diagnosis not present

## 2022-07-13 DIAGNOSIS — Z5112 Encounter for antineoplastic immunotherapy: Secondary | ICD-10-CM | POA: Diagnosis not present

## 2022-07-13 DIAGNOSIS — C3432 Malignant neoplasm of lower lobe, left bronchus or lung: Secondary | ICD-10-CM | POA: Diagnosis not present

## 2022-07-13 DIAGNOSIS — R0602 Shortness of breath: Secondary | ICD-10-CM | POA: Diagnosis not present

## 2022-07-13 DIAGNOSIS — E876 Hypokalemia: Secondary | ICD-10-CM | POA: Diagnosis not present

## 2022-07-13 DIAGNOSIS — K5900 Constipation, unspecified: Secondary | ICD-10-CM | POA: Diagnosis not present

## 2022-07-13 DIAGNOSIS — R197 Diarrhea, unspecified: Secondary | ICD-10-CM | POA: Diagnosis not present

## 2022-07-13 DIAGNOSIS — Z95828 Presence of other vascular implants and grafts: Secondary | ICD-10-CM

## 2022-07-13 DIAGNOSIS — R070 Pain in throat: Secondary | ICD-10-CM | POA: Diagnosis not present

## 2022-07-13 DIAGNOSIS — E039 Hypothyroidism, unspecified: Secondary | ICD-10-CM | POA: Diagnosis not present

## 2022-07-13 DIAGNOSIS — D696 Thrombocytopenia, unspecified: Secondary | ICD-10-CM | POA: Diagnosis not present

## 2022-07-13 DIAGNOSIS — Z79899 Other long term (current) drug therapy: Secondary | ICD-10-CM | POA: Diagnosis not present

## 2022-07-13 DIAGNOSIS — C321 Malignant neoplasm of supraglottis: Secondary | ICD-10-CM | POA: Diagnosis not present

## 2022-07-13 DIAGNOSIS — Z9049 Acquired absence of other specified parts of digestive tract: Secondary | ICD-10-CM | POA: Diagnosis not present

## 2022-07-13 DIAGNOSIS — R072 Precordial pain: Secondary | ICD-10-CM | POA: Diagnosis not present

## 2022-07-13 DIAGNOSIS — Z79811 Long term (current) use of aromatase inhibitors: Secondary | ICD-10-CM | POA: Diagnosis not present

## 2022-07-13 DIAGNOSIS — C50412 Malignant neoplasm of upper-outer quadrant of left female breast: Secondary | ICD-10-CM | POA: Diagnosis not present

## 2022-07-13 DIAGNOSIS — C3490 Malignant neoplasm of unspecified part of unspecified bronchus or lung: Secondary | ICD-10-CM

## 2022-07-13 DIAGNOSIS — Z923 Personal history of irradiation: Secondary | ICD-10-CM | POA: Diagnosis not present

## 2022-07-13 DIAGNOSIS — Z87891 Personal history of nicotine dependence: Secondary | ICD-10-CM | POA: Diagnosis not present

## 2022-07-13 DIAGNOSIS — Z170 Estrogen receptor positive status [ER+]: Secondary | ICD-10-CM | POA: Diagnosis not present

## 2022-07-13 DIAGNOSIS — I3139 Other pericardial effusion (noninflammatory): Secondary | ICD-10-CM | POA: Diagnosis not present

## 2022-07-13 DIAGNOSIS — M25569 Pain in unspecified knee: Secondary | ICD-10-CM | POA: Diagnosis not present

## 2022-07-13 DIAGNOSIS — Z8249 Family history of ischemic heart disease and other diseases of the circulatory system: Secondary | ICD-10-CM | POA: Diagnosis not present

## 2022-07-13 DIAGNOSIS — Z8719 Personal history of other diseases of the digestive system: Secondary | ICD-10-CM | POA: Diagnosis not present

## 2022-07-13 DIAGNOSIS — Z833 Family history of diabetes mellitus: Secondary | ICD-10-CM | POA: Diagnosis not present

## 2022-07-13 LAB — CBC WITH DIFFERENTIAL/PLATELET
Abs Immature Granulocytes: 0.01 10*3/uL (ref 0.00–0.07)
Basophils Absolute: 0 10*3/uL (ref 0.0–0.1)
Basophils Relative: 1 %
Eosinophils Absolute: 0.2 10*3/uL (ref 0.0–0.5)
Eosinophils Relative: 3 %
HCT: 39.8 % (ref 36.0–46.0)
Hemoglobin: 13.3 g/dL (ref 12.0–15.0)
Immature Granulocytes: 0 %
Lymphocytes Relative: 13 %
Lymphs Abs: 0.7 10*3/uL (ref 0.7–4.0)
MCH: 29.8 pg (ref 26.0–34.0)
MCHC: 33.4 g/dL (ref 30.0–36.0)
MCV: 89.2 fL (ref 80.0–100.0)
Monocytes Absolute: 0.3 10*3/uL (ref 0.1–1.0)
Monocytes Relative: 6 %
Neutro Abs: 3.9 10*3/uL (ref 1.7–7.7)
Neutrophils Relative %: 77 %
Platelets: 180 10*3/uL (ref 150–400)
RBC: 4.46 MIL/uL (ref 3.87–5.11)
RDW: 12.9 % (ref 11.5–15.5)
WBC: 5 10*3/uL (ref 4.0–10.5)
nRBC: 0 % (ref 0.0–0.2)

## 2022-07-13 LAB — COMPREHENSIVE METABOLIC PANEL
ALT: 9 U/L (ref 0–44)
AST: 13 U/L — ABNORMAL LOW (ref 15–41)
Albumin: 3.5 g/dL (ref 3.5–5.0)
Alkaline Phosphatase: 65 U/L (ref 38–126)
Anion gap: 7 (ref 5–15)
BUN: 13 mg/dL (ref 8–23)
CO2: 24 mmol/L (ref 22–32)
Calcium: 9 mg/dL (ref 8.9–10.3)
Chloride: 106 mmol/L (ref 98–111)
Creatinine, Ser: 0.82 mg/dL (ref 0.44–1.00)
GFR, Estimated: >60 mL/min (ref >60)
Glucose, Bld: 111 mg/dL — ABNORMAL HIGH (ref 70–99)
Potassium: 4.1 mmol/L (ref 3.5–5.1)
Sodium: 137 mmol/L (ref 135–145)
Total Bilirubin: 2 mg/dL — ABNORMAL HIGH (ref 0.3–1.2)
Total Protein: 6.9 g/dL (ref 6.5–8.1)

## 2022-07-13 LAB — TSH: TSH: 10.346 u[IU]/mL — ABNORMAL HIGH (ref 0.350–4.500)

## 2022-07-13 LAB — MAGNESIUM: Magnesium: 2.1 mg/dL (ref 1.7–2.4)

## 2022-07-13 MED ORDER — HEPARIN SOD (PORK) LOCK FLUSH 100 UNIT/ML IV SOLN
500.0000 [IU] | Freq: Once | INTRAVENOUS | Status: AC | PRN
  Administered 2022-07-13: 500 [IU]

## 2022-07-13 MED ORDER — SODIUM CHLORIDE 0.9% FLUSH
10.0000 mL | Freq: Once | INTRAVENOUS | Status: AC
  Administered 2022-07-13: 10 mL via INTRAVENOUS

## 2022-07-13 MED ORDER — SODIUM CHLORIDE 0.9 % IV SOLN: Freq: Once | INTRAVENOUS | Status: AC

## 2022-07-13 MED ORDER — SODIUM CHLORIDE 0.9% FLUSH
10.0000 mL | INTRAVENOUS | Status: DC | PRN
  Administered 2022-07-13: 10 mL

## 2022-07-13 MED ORDER — SODIUM CHLORIDE 0.9 % IV SOLN
10.0000 mg/kg | Freq: Once | INTRAVENOUS | Status: AC
  Administered 2022-07-13: 860 mg via INTRAVENOUS
  Filled 2022-07-13: qty 10

## 2022-07-13 NOTE — Patient Instructions (Signed)
[place]  Discharge Instructions: Thank you for choosing [facility] to provide your oncology and hematology care.  If you have a lab appointment with the [place], please come in thru the Main Entrance and check in at the main information desk.  Wear comfortable clothing and clothing appropriate for easy access to any Portacath or PICC line.   We strive to give you quality time with your provider. You may need to reschedule your appointment if you arrive late (15 or more minutes).  Arriving late affects you and other patients whose appointments are after yours.  Also, if you miss three or more appointments without notifying the office, you may be dismissed from the clinic at the provider's discretion.      For prescription refill requests, have your pharmacy contact our office and allow 72 hours for refills to be completed.    Today you received the following chemotherapy and/or immunotherapy agents Imfinzi. Durvalumab Injection What is this medication? DURVALUMAB (dur VAL ue mab) treats some types of cancer. It works by helping your immune system slow or stop the spread of cancer cells. It is a monoclonal antibody. This medicine may be used for other purposes; ask your health care provider or pharmacist if you have questions. COMMON BRAND NAME(S): IMFINZI What should I tell my care team before I take this medication? They need to know if you have any of these conditions: Allogeneic stem cell transplant (uses someone else's stem cells) Autoimmune diseases, such as Crohn disease, ulcerative colitis, lupus History of chest radiation Nervous system problems, such as Guillain-Barre syndrome, myasthenia gravis Organ transplant An unusual or allergic reaction to durvalumab, other medications, foods, dyes, or preservatives Pregnant or trying to get pregnant Breast-feeding How should I use this medication? This medication is infused into a vein. It is  given by your care team in a hospital or clinic setting. A special MedGuide will be given to you before each treatment. Be sure to read this information carefully each time. Talk to your care team about the use of this medication in children. Special care may be needed. Overdosage: If you think you have taken too much of this medicine contact a poison control center or emergency room at once. NOTE: This medicine is only for you. Do not share this medicine with others. What if I miss a dose? Keep appointments for follow-up doses. It is important not to miss your dose. Call your care team if you are unable to keep an appointment. What may interact with this medication? Interactions have not been studied. This list may not describe all possible interactions. Give your health care provider a list of all the medicines, herbs, non-prescription drugs, or dietary supplements you use. Also tell them if you smoke, drink alcohol, or use illegal drugs. Some items may interact with your medicine. What should I watch for while using this medication? Your condition will be monitored carefully while you are receiving this medication. You may need blood work while taking this medication. This medication may cause serious skin reactions. They can happen weeks to months after starting the medication. Contact your care team right away if you notice fevers or flu-like symptoms with a rash. The rash may be red or purple and then turn into blisters or peeling of the skin. You may also notice a red rash with swelling of the face, lips, or lymph nodes in your neck or under your arms. Tell your care team right away if  you have any change in your eyesight. Talk to your care team if you may be pregnant. Serious birth defects can occur if you take this medication during pregnancy and for 3 months after the last dose. You will need a negative pregnancy test before starting this medication. Contraception is recommended while taking  this medication and for 3 months after the last dose. Your care team can help you find the option that works for you. Do not breastfeed while taking this medication and for 3 months after the last dose. What side effects may I notice from receiving this medication? Side effects that you should report to your care team as soon as possible: Allergic reactions--skin rash, itching, hives, swelling of the face, lips, tongue, or throat Dry cough, shortness of breath or trouble breathing Eye pain, redness, irritation, or discharge with blurry or decreased vision Heart muscle inflammation--unusual weakness or fatigue, shortness of breath, chest pain, fast or irregular heartbeat, dizziness, swelling of the ankles, feet, or hands Hormone gland problems--headache, sensitivity to light, unusual weakness or fatigue, dizziness, fast or irregular heartbeat, increased sensitivity to cold or heat, excessive sweating, constipation, hair loss, increased thirst or amount of urine, tremors or shaking, irritability Infusion reactions--chest pain, shortness of breath or trouble breathing, feeling faint or lightheaded Kidney injury (glomerulonephritis)--decrease in the amount of urine, red or dark brown urine, foamy or bubbly urine, swelling of the ankles, hands, or feet Liver injury--right upper belly pain, loss of appetite, nausea, light-colored stool, dark yellow or brown urine, yellowing skin or eyes, unusual weakness or fatigue Pain, tingling, or numbness in the hands or feet, muscle weakness, change in vision, confusion or trouble speaking, loss of balance or coordination, trouble walking, seizures Rash, fever, and swollen lymph nodes Redness, blistering, peeling, or loosening of the skin, including inside the mouth Sudden or severe stomach pain, bloody diarrhea, fever, nausea, vomiting Side effects that usually do not require medical attention (report these to your care team if they continue or are  bothersome): Bone, joint, or muscle pain Diarrhea Fatigue Loss of appetite Nausea Skin rash This list may not describe all possible side effects. Call your doctor for medical advice about side effects. You may report side effects to FDA at 1-800-FDA-1088. Where should I keep my medication? This medication is given in a hospital or clinic. It will not be stored at home. NOTE: This sheet is a summary. It may not cover all possible information. If you have questions about this medicine, talk to your doctor, pharmacist, or health care provider.  2023 Elsevier/Gold Standard (2021-11-01 00:00:00)       To help prevent nausea and vomiting after your treatment, we encourage you to take your nausea medication as directed.  BELOW ARE SYMPTOMS THAT SHOULD BE REPORTED IMMEDIATELY: *FEVER GREATER THAN 100.4 F (38 C) OR HIGHER *CHILLS OR SWEATING *NAUSEA AND VOMITING THAT IS NOT CONTROLLED WITH YOUR NAUSEA MEDICATION *UNUSUAL SHORTNESS OF BREATH *UNUSUAL BRUISING OR BLEEDING *URINARY PROBLEMS (pain or burning when urinating, or frequent urination) *BOWEL PROBLEMS (unusual diarrhea, constipation, pain near the anus) TENDERNESS IN MOUTH AND THROAT WITH OR WITHOUT PRESENCE OF ULCERS (sore throat, sores in mouth, or a toothache) UNUSUAL RASH, SWELLING OR PAIN  UNUSUAL VAGINAL DISCHARGE OR ITCHING   Items with * indicate a potential emergency and should be followed up as soon as possible or go to the Emergency Department if any problems should occur.  Please show the CHEMOTHERAPY ALERT CARD or IMMUNOTHERAPY ALERT CARD at check-in to the Emergency  Department and triage nurse.  Should you have questions after your visit or need to cancel or reschedule your appointment, please contact [place] [phone]  and follow the prompts.  Office hours are 8:00 a.m. to 4:30 p.m. Monday - Friday. Please note that voicemails left after 4:00 p.m. may not be returned until the following  business day.  We are closed weekends and major holidays. You have access to a nurse at all times for urgent questions. Please call the main number to the clinic [phone] and follow the prompts.  For any non-urgent questions, you may also contact your provider using MyChart. We now offer e-Visits for anyone 11 and older to request care online for non-urgent symptoms. For details visit [email or web address].   Also download the MyChart app! Go to the app store, search "MyChart", open the app, select [place], and log in with your MyChart username and password.  Masks are optional in the cancer centers. If you would like for your care team to wear a mask while they are taking care of you, please let them know. You may have one support person who is at least 69 years old accompany you for your appointments.

## 2022-07-13 NOTE — Progress Notes (Signed)
Patient presents today for Imfinzi. Total bilirubin 2.0 today. Labs reported to Dr. Delton Coombes and verbal order received to proceed with treatment.   Imfinzi given today per MD orders. Tolerated infusion without adverse affects. Vital signs stable. No complaints at this time. Discharged from clinic ambulatory in stable condition. Alert and oriented x 3. F/U with [facility] as scheduled.

## 2022-07-26 ENCOUNTER — Other Ambulatory Visit: Payer: Self-pay

## 2022-07-26 DIAGNOSIS — C321 Malignant neoplasm of supraglottis: Secondary | ICD-10-CM

## 2022-07-27 ENCOUNTER — Ambulatory Visit: Payer: Medicare HMO | Admitting: Hematology

## 2022-07-27 ENCOUNTER — Inpatient Hospital Stay: Payer: Medicare HMO

## 2022-07-27 ENCOUNTER — Other Ambulatory Visit: Payer: Medicare HMO

## 2022-07-27 ENCOUNTER — Inpatient Hospital Stay: Payer: Medicare HMO | Attending: Hematology and Oncology

## 2022-07-27 VITALS — BP 117/72 | HR 71 | Temp 97.7°F | Resp 20 | Ht 60.24 in | Wt 189.2 lb

## 2022-07-27 DIAGNOSIS — Z809 Family history of malignant neoplasm, unspecified: Secondary | ICD-10-CM | POA: Insufficient documentation

## 2022-07-27 DIAGNOSIS — C3490 Malignant neoplasm of unspecified part of unspecified bronchus or lung: Secondary | ICD-10-CM

## 2022-07-27 DIAGNOSIS — Z8249 Family history of ischemic heart disease and other diseases of the circulatory system: Secondary | ICD-10-CM | POA: Insufficient documentation

## 2022-07-27 DIAGNOSIS — Z923 Personal history of irradiation: Secondary | ICD-10-CM | POA: Insufficient documentation

## 2022-07-27 DIAGNOSIS — R197 Diarrhea, unspecified: Secondary | ICD-10-CM | POA: Insufficient documentation

## 2022-07-27 DIAGNOSIS — Z806 Family history of leukemia: Secondary | ICD-10-CM | POA: Insufficient documentation

## 2022-07-27 DIAGNOSIS — F32A Depression, unspecified: Secondary | ICD-10-CM | POA: Diagnosis not present

## 2022-07-27 DIAGNOSIS — C3432 Malignant neoplasm of lower lobe, left bronchus or lung: Secondary | ICD-10-CM | POA: Insufficient documentation

## 2022-07-27 DIAGNOSIS — Z818 Family history of other mental and behavioral disorders: Secondary | ICD-10-CM | POA: Insufficient documentation

## 2022-07-27 DIAGNOSIS — Z800 Family history of malignant neoplasm of digestive organs: Secondary | ICD-10-CM | POA: Insufficient documentation

## 2022-07-27 DIAGNOSIS — C321 Malignant neoplasm of supraglottis: Secondary | ICD-10-CM | POA: Insufficient documentation

## 2022-07-27 DIAGNOSIS — R072 Precordial pain: Secondary | ICD-10-CM | POA: Insufficient documentation

## 2022-07-27 DIAGNOSIS — Z9221 Personal history of antineoplastic chemotherapy: Secondary | ICD-10-CM | POA: Insufficient documentation

## 2022-07-27 DIAGNOSIS — R059 Cough, unspecified: Secondary | ICD-10-CM | POA: Insufficient documentation

## 2022-07-27 DIAGNOSIS — J029 Acute pharyngitis, unspecified: Secondary | ICD-10-CM | POA: Diagnosis not present

## 2022-07-27 DIAGNOSIS — G479 Sleep disorder, unspecified: Secondary | ICD-10-CM | POA: Insufficient documentation

## 2022-07-27 DIAGNOSIS — Z833 Family history of diabetes mellitus: Secondary | ICD-10-CM | POA: Insufficient documentation

## 2022-07-27 DIAGNOSIS — E039 Hypothyroidism, unspecified: Secondary | ICD-10-CM | POA: Insufficient documentation

## 2022-07-27 DIAGNOSIS — R5383 Other fatigue: Secondary | ICD-10-CM | POA: Diagnosis not present

## 2022-07-27 DIAGNOSIS — Z79899 Other long term (current) drug therapy: Secondary | ICD-10-CM | POA: Diagnosis not present

## 2022-07-27 DIAGNOSIS — R110 Nausea: Secondary | ICD-10-CM | POA: Diagnosis not present

## 2022-07-27 DIAGNOSIS — K5900 Constipation, unspecified: Secondary | ICD-10-CM | POA: Insufficient documentation

## 2022-07-27 DIAGNOSIS — R070 Pain in throat: Secondary | ICD-10-CM | POA: Diagnosis not present

## 2022-07-27 DIAGNOSIS — Z8051 Family history of malignant neoplasm of kidney: Secondary | ICD-10-CM | POA: Insufficient documentation

## 2022-07-27 DIAGNOSIS — Z8041 Family history of malignant neoplasm of ovary: Secondary | ICD-10-CM | POA: Insufficient documentation

## 2022-07-27 DIAGNOSIS — Z87891 Personal history of nicotine dependence: Secondary | ICD-10-CM | POA: Diagnosis not present

## 2022-07-27 DIAGNOSIS — E669 Obesity, unspecified: Secondary | ICD-10-CM | POA: Insufficient documentation

## 2022-07-27 DIAGNOSIS — E876 Hypokalemia: Secondary | ICD-10-CM | POA: Insufficient documentation

## 2022-07-27 DIAGNOSIS — Z801 Family history of malignant neoplasm of trachea, bronchus and lung: Secondary | ICD-10-CM | POA: Insufficient documentation

## 2022-07-27 DIAGNOSIS — R0602 Shortness of breath: Secondary | ICD-10-CM | POA: Diagnosis not present

## 2022-07-27 DIAGNOSIS — Z825 Family history of asthma and other chronic lower respiratory diseases: Secondary | ICD-10-CM | POA: Insufficient documentation

## 2022-07-27 DIAGNOSIS — Z95828 Presence of other vascular implants and grafts: Secondary | ICD-10-CM

## 2022-07-27 DIAGNOSIS — Z5112 Encounter for antineoplastic immunotherapy: Secondary | ICD-10-CM | POA: Diagnosis not present

## 2022-07-27 LAB — CBC WITH DIFFERENTIAL/PLATELET
Abs Immature Granulocytes: 0.01 10*3/uL (ref 0.00–0.07)
Basophils Absolute: 0 10*3/uL (ref 0.0–0.1)
Basophils Relative: 1 %
Eosinophils Absolute: 0.2 10*3/uL (ref 0.0–0.5)
Eosinophils Relative: 4 %
HCT: 39.6 % (ref 36.0–46.0)
Hemoglobin: 13.3 g/dL (ref 12.0–15.0)
Immature Granulocytes: 0 %
Lymphocytes Relative: 15 %
Lymphs Abs: 0.6 10*3/uL — ABNORMAL LOW (ref 0.7–4.0)
MCH: 30.3 pg (ref 26.0–34.0)
MCHC: 33.6 g/dL (ref 30.0–36.0)
MCV: 90.2 fL (ref 80.0–100.0)
Monocytes Absolute: 0.3 10*3/uL (ref 0.1–1.0)
Monocytes Relative: 7 %
Neutro Abs: 3 10*3/uL (ref 1.7–7.7)
Neutrophils Relative %: 73 %
Platelets: 178 10*3/uL (ref 150–400)
RBC: 4.39 MIL/uL (ref 3.87–5.11)
RDW: 13.1 % (ref 11.5–15.5)
WBC: 4.1 10*3/uL (ref 4.0–10.5)
nRBC: 0 % (ref 0.0–0.2)

## 2022-07-27 LAB — COMPREHENSIVE METABOLIC PANEL
ALT: 9 U/L (ref 0–44)
AST: 13 U/L — ABNORMAL LOW (ref 15–41)
Albumin: 3.4 g/dL — ABNORMAL LOW (ref 3.5–5.0)
Alkaline Phosphatase: 65 U/L (ref 38–126)
Anion gap: 6 (ref 5–15)
BUN: 14 mg/dL (ref 8–23)
CO2: 24 mmol/L (ref 22–32)
Calcium: 8.8 mg/dL — ABNORMAL LOW (ref 8.9–10.3)
Chloride: 106 mmol/L (ref 98–111)
Creatinine, Ser: 0.74 mg/dL (ref 0.44–1.00)
GFR, Estimated: >60 mL/min (ref >60)
Glucose, Bld: 99 mg/dL (ref 70–99)
Potassium: 4.1 mmol/L (ref 3.5–5.1)
Sodium: 136 mmol/L (ref 135–145)
Total Bilirubin: 1.6 mg/dL — ABNORMAL HIGH (ref 0.3–1.2)
Total Protein: 6.7 g/dL (ref 6.5–8.1)

## 2022-07-27 LAB — TSH: TSH: 17.214 u[IU]/mL — ABNORMAL HIGH (ref 0.350–4.500)

## 2022-07-27 LAB — MAGNESIUM: Magnesium: 2.1 mg/dL (ref 1.7–2.4)

## 2022-07-27 MED ORDER — SODIUM CHLORIDE 0.9% FLUSH: 10.0000 mL | INTRAVENOUS | Status: DC | PRN

## 2022-07-27 MED ORDER — SODIUM CHLORIDE 0.9 % IV SOLN
10.0000 mg/kg | Freq: Once | INTRAVENOUS | Status: AC
  Administered 2022-07-27: 860 mg via INTRAVENOUS
  Filled 2022-07-27: qty 7.2

## 2022-07-27 MED ORDER — SODIUM CHLORIDE 0.9 % IV SOLN: Freq: Once | INTRAVENOUS | Status: AC

## 2022-07-27 MED ORDER — HEPARIN SOD (PORK) LOCK FLUSH 100 UNIT/ML IV SOLN
500.0000 [IU] | Freq: Once | INTRAVENOUS | Status: AC | PRN
  Administered 2022-07-27: 500 [IU]

## 2022-07-27 MED ORDER — SODIUM CHLORIDE 0.9% FLUSH
10.0000 mL | INTRAVENOUS | Status: DC | PRN
  Administered 2022-07-27 (×2): 10 mL via INTRAVENOUS

## 2022-07-27 NOTE — Progress Notes (Signed)
Patients port flushed without difficulty.  Good blood return noted with no bruising or swelling noted at site.  Patient remains accessed for chemotherapy treatment.  

## 2022-07-27 NOTE — Patient Instructions (Signed)
[place]  Discharge Instructions: Thank you for choosing [place] to provide your oncology and hematology care.  If you have a lab appointment with the [facility], please come in thru the Main Entrance and check in at the main information desk.  Wear comfortable clothing and clothing appropriate for easy access to any Portacath or PICC line.   We strive to give you quality time with your provider. You may need to reschedule your appointment if you arrive late (15 or more minutes).  Arriving late affects you and other patients whose appointments are after yours.  Also, if you miss three or more appointments without notifying the office, you may be dismissed from the clinic at the provider's discretion.      For prescription refill requests, have your pharmacy contact our office and allow 72 hours for refills to be completed.    Today you received the following chemotherapy and/or immunotherapy agents Imfinzi. Durvalumab Injection What is this medication? DURVALUMAB (dur VAL ue mab) treats some types of cancer. It works by helping your immune system slow or stop the spread of cancer cells. It is a monoclonal antibody. This medicine may be used for other purposes; ask your health care provider or pharmacist if you have questions. COMMON BRAND NAME(S): IMFINZI What should I tell my care team before I take this medication? They need to know if you have any of these conditions: Allogeneic stem cell transplant (uses someone else's stem cells) Autoimmune diseases, such as Crohn disease, ulcerative colitis, lupus History of chest radiation Nervous system problems, such as Guillain-Barre syndrome, myasthenia gravis Organ transplant An unusual or allergic reaction to durvalumab, other medications, foods, dyes, or preservatives Pregnant or trying to get pregnant Breast-feeding How should I use this medication? This medication is infused into a vein. It is  given by your care team in a hospital or clinic setting. A special MedGuide will be given to you before each treatment. Be sure to read this information carefully each time. Talk to your care team about the use of this medication in children. Special care may be needed. Overdosage: If you think you have taken too much of this medicine contact a poison control center or emergency room at once. NOTE: This medicine is only for you. Do not share this medicine with others. What if I miss a dose? Keep appointments for follow-up doses. It is important not to miss your dose. Call your care team if you are unable to keep an appointment. What may interact with this medication? Interactions have not been studied. This list may not describe all possible interactions. Give your health care provider a list of all the medicines, herbs, non-prescription drugs, or dietary supplements you use. Also tell them if you smoke, drink alcohol, or use illegal drugs. Some items may interact with your medicine. What should I watch for while using this medication? Your condition will be monitored carefully while you are receiving this medication. You may need blood work while taking this medication. This medication may cause serious skin reactions. They can happen weeks to months after starting the medication. Contact your care team right away if you notice fevers or flu-like symptoms with a rash. The rash may be red or purple and then turn into blisters or peeling of the skin. You may also notice a red rash with swelling of the face, lips, or lymph nodes in your neck or under your arms. Tell your care team right away if  you have any change in your eyesight. Talk to your care team if you may be pregnant. Serious birth defects can occur if you take this medication during pregnancy and for 3 months after the last dose. You will need a negative pregnancy test before starting this medication. Contraception is recommended while taking  this medication and for 3 months after the last dose. Your care team can help you find the option that works for you. Do not breastfeed while taking this medication and for 3 months after the last dose. What side effects may I notice from receiving this medication? Side effects that you should report to your care team as soon as possible: Allergic reactions--skin rash, itching, hives, swelling of the face, lips, tongue, or throat Dry cough, shortness of breath or trouble breathing Eye pain, redness, irritation, or discharge with blurry or decreased vision Heart muscle inflammation--unusual weakness or fatigue, shortness of breath, chest pain, fast or irregular heartbeat, dizziness, swelling of the ankles, feet, or hands Hormone gland problems--headache, sensitivity to light, unusual weakness or fatigue, dizziness, fast or irregular heartbeat, increased sensitivity to cold or heat, excessive sweating, constipation, hair loss, increased thirst or amount of urine, tremors or shaking, irritability Infusion reactions--chest pain, shortness of breath or trouble breathing, feeling faint or lightheaded Kidney injury (glomerulonephritis)--decrease in the amount of urine, red or dark brown urine, foamy or bubbly urine, swelling of the ankles, hands, or feet Liver injury--right upper belly pain, loss of appetite, nausea, light-colored stool, dark yellow or brown urine, yellowing skin or eyes, unusual weakness or fatigue Pain, tingling, or numbness in the hands or feet, muscle weakness, change in vision, confusion or trouble speaking, loss of balance or coordination, trouble walking, seizures Rash, fever, and swollen lymph nodes Redness, blistering, peeling, or loosening of the skin, including inside the mouth Sudden or severe stomach pain, bloody diarrhea, fever, nausea, vomiting Side effects that usually do not require medical attention (report these to your care team if they continue or are  bothersome): Bone, joint, or muscle pain Diarrhea Fatigue Loss of appetite Nausea Skin rash This list may not describe all possible side effects. Call your doctor for medical advice about side effects. You may report side effects to FDA at 1-800-FDA-1088. Where should I keep my medication? This medication is given in a hospital or clinic. It will not be stored at home. NOTE: This sheet is a summary. It may not cover all possible information. If you have questions about this medicine, talk to your doctor, pharmacist, or health care provider.  2023 Elsevier/Gold Standard (2021-11-01 00:00:00)       To help prevent nausea and vomiting after your treatment, we encourage you to take your nausea medication as directed.  BELOW ARE SYMPTOMS THAT SHOULD BE REPORTED IMMEDIATELY: *FEVER GREATER THAN 100.4 F (38 C) OR HIGHER *CHILLS OR SWEATING *NAUSEA AND VOMITING THAT IS NOT CONTROLLED WITH YOUR NAUSEA MEDICATION *UNUSUAL SHORTNESS OF BREATH *UNUSUAL BRUISING OR BLEEDING *URINARY PROBLEMS (pain or burning when urinating, or frequent urination) *BOWEL PROBLEMS (unusual diarrhea, constipation, pain near the anus) TENDERNESS IN MOUTH AND THROAT WITH OR WITHOUT PRESENCE OF ULCERS (sore throat, sores in mouth, or a toothache) UNUSUAL RASH, SWELLING OR PAIN  UNUSUAL VAGINAL DISCHARGE OR ITCHING   Items with * indicate a potential emergency and should be followed up as soon as possible or go to the Emergency Department if any problems should occur.  Please show the CHEMOTHERAPY ALERT CARD or IMMUNOTHERAPY ALERT CARD at check-in to the Emergency  Department and triage nurse.  Should you have questions after your visit or need to cancel or reschedule your appointment, please contact [place] [phone]  and follow the prompts.  Office hours are 8:00 a.m. to 4:30 p.m. Monday - Friday. Please note that voicemails left after 4:00 p.m. may not be returned until the following  business day.  We are closed weekends and major holidays. You have access to a nurse at all times for urgent questions. Please call the main number to the clinic [phone] and follow the prompts.  For any non-urgent questions, you may also contact your provider using MyChart. We now offer e-Visits for anyone 70 and older to request care online for non-urgent symptoms. For details visit [email or web address].   Also download the MyChart app! Go to the app store, search "MyChart", open the app, select [place], and log in with your MyChart username and password.

## 2022-07-27 NOTE — Progress Notes (Signed)
Patient presents today for Imfinzi treatment. MAR reviewed and updated. Vital signs within parameters for treatment. Labs pending. 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 Patient A-febrile. Paitent states she has been dizzy for 2-3 days and has had shortness of breath, headache, and body aches for 1-2 weeks. Patient states she has a productive cough and at times has a tinge of blood when she coughs. Patient states Dr. Delton Coombes put her on an antibiotic for a kidney infection which she did not finish the antibiotics because she wasn't burning anymore per patient's words.   Total bili 1.6. All other labs within parameters for treatment.   Message received from A. Ouida Sills RN / Dr. Delton Coombes to proceed with treatment.   Imfinzi given today per MD orders. Tolerated infusion without adverse affects. Vital signs stable. No complaints at this time. Discharged from clinic ambulatory in stable condition. Alert and oriented x 3. F/U with [facility] as scheduled.

## 2022-08-01 ENCOUNTER — Other Ambulatory Visit: Payer: Self-pay | Admitting: *Deleted

## 2022-08-01 ENCOUNTER — Telehealth: Payer: Self-pay | Admitting: *Deleted

## 2022-08-01 NOTE — Telephone Encounter (Signed)
Received call from sister Hilda Blades to advise that patient has been experiencing a plethora of new symptoms, which were all documented on 07/29/22 and brought to the attention of Dr. Delton Coombes.  She went on to say they were not aware that her treatment had changed to Infinzi, which is not a new change.  Patient has been on therapy since July of 2023.  In addition, stressed to her that patient admitted to not finishing previously prescribed antibiotic for UTI and that it is extremely important that she finish antibiotic therapy to avoid serious, potentially life threatening infection.  Made her aware that if she develops a fever or symptoms worsen, she needs to go to the ER immediately.  Verbalized understanding of above.

## 2022-08-03 ENCOUNTER — Ambulatory Visit (HOSPITAL_COMMUNITY)
Admission: RE | Admit: 2022-08-03 | Discharge: 2022-08-03 | Disposition: A | Discharge status: Home / Self Care | Payer: Medicare HMO | Source: Ambulatory Visit | Attending: Hematology | Admitting: Hematology

## 2022-08-03 DIAGNOSIS — Z85118 Personal history of other malignant neoplasm of bronchus and lung: Secondary | ICD-10-CM | POA: Diagnosis not present

## 2022-08-03 DIAGNOSIS — C3490 Malignant neoplasm of unspecified part of unspecified bronchus or lung: Secondary | ICD-10-CM | POA: Diagnosis not present

## 2022-08-03 DIAGNOSIS — C321 Malignant neoplasm of supraglottis: Secondary | ICD-10-CM | POA: Insufficient documentation

## 2022-08-03 DIAGNOSIS — C760 Malignant neoplasm of head, face and neck: Secondary | ICD-10-CM | POA: Diagnosis not present

## 2022-08-03 DIAGNOSIS — E049 Nontoxic goiter, unspecified: Secondary | ICD-10-CM | POA: Diagnosis not present

## 2022-08-03 DIAGNOSIS — J439 Emphysema, unspecified: Secondary | ICD-10-CM | POA: Diagnosis not present

## 2022-08-03 DIAGNOSIS — I6523 Occlusion and stenosis of bilateral carotid arteries: Secondary | ICD-10-CM | POA: Diagnosis not present

## 2022-08-03 MED ORDER — IOHEXOL 300 MG/ML  SOLN
75.0000 mL | Freq: Once | INTRAMUSCULAR | Status: AC | PRN
  Administered 2022-08-03: 75 mL via INTRAVENOUS

## 2022-08-08 ENCOUNTER — Other Ambulatory Visit: Payer: Self-pay

## 2022-08-08 DIAGNOSIS — C3490 Malignant neoplasm of unspecified part of unspecified bronchus or lung: Secondary | ICD-10-CM

## 2022-08-10 ENCOUNTER — Encounter: Payer: Self-pay | Admitting: Hematology

## 2022-08-10 ENCOUNTER — Inpatient Hospital Stay: Payer: Medicare HMO

## 2022-08-10 ENCOUNTER — Inpatient Hospital Stay: Payer: Medicare HMO | Admitting: Hematology

## 2022-08-10 VITALS — BP 110/80 | HR 76 | Temp 97.2°F | Resp 20 | Wt 189.0 lb

## 2022-08-10 DIAGNOSIS — C3490 Malignant neoplasm of unspecified part of unspecified bronchus or lung: Secondary | ICD-10-CM | POA: Diagnosis not present

## 2022-08-10 DIAGNOSIS — R197 Diarrhea, unspecified: Secondary | ICD-10-CM | POA: Diagnosis not present

## 2022-08-10 DIAGNOSIS — Z8041 Family history of malignant neoplasm of ovary: Secondary | ICD-10-CM | POA: Diagnosis not present

## 2022-08-10 DIAGNOSIS — R0602 Shortness of breath: Secondary | ICD-10-CM | POA: Diagnosis not present

## 2022-08-10 DIAGNOSIS — R110 Nausea: Secondary | ICD-10-CM | POA: Diagnosis not present

## 2022-08-10 DIAGNOSIS — Z79899 Other long term (current) drug therapy: Secondary | ICD-10-CM | POA: Diagnosis not present

## 2022-08-10 DIAGNOSIS — E669 Obesity, unspecified: Secondary | ICD-10-CM | POA: Diagnosis not present

## 2022-08-10 DIAGNOSIS — G479 Sleep disorder, unspecified: Secondary | ICD-10-CM | POA: Diagnosis not present

## 2022-08-10 DIAGNOSIS — Z923 Personal history of irradiation: Secondary | ICD-10-CM | POA: Diagnosis not present

## 2022-08-10 DIAGNOSIS — Z9221 Personal history of antineoplastic chemotherapy: Secondary | ICD-10-CM | POA: Diagnosis not present

## 2022-08-10 DIAGNOSIS — R070 Pain in throat: Secondary | ICD-10-CM | POA: Diagnosis not present

## 2022-08-10 DIAGNOSIS — R072 Precordial pain: Secondary | ICD-10-CM | POA: Diagnosis not present

## 2022-08-10 DIAGNOSIS — J029 Acute pharyngitis, unspecified: Secondary | ICD-10-CM | POA: Diagnosis not present

## 2022-08-10 DIAGNOSIS — C321 Malignant neoplasm of supraglottis: Secondary | ICD-10-CM | POA: Diagnosis not present

## 2022-08-10 DIAGNOSIS — Z87891 Personal history of nicotine dependence: Secondary | ICD-10-CM | POA: Diagnosis not present

## 2022-08-10 DIAGNOSIS — Z833 Family history of diabetes mellitus: Secondary | ICD-10-CM | POA: Diagnosis not present

## 2022-08-10 DIAGNOSIS — Z95828 Presence of other vascular implants and grafts: Secondary | ICD-10-CM

## 2022-08-10 DIAGNOSIS — F32A Depression, unspecified: Secondary | ICD-10-CM | POA: Diagnosis not present

## 2022-08-10 DIAGNOSIS — Z5112 Encounter for antineoplastic immunotherapy: Secondary | ICD-10-CM | POA: Diagnosis not present

## 2022-08-10 DIAGNOSIS — R059 Cough, unspecified: Secondary | ICD-10-CM | POA: Diagnosis not present

## 2022-08-10 DIAGNOSIS — Z800 Family history of malignant neoplasm of digestive organs: Secondary | ICD-10-CM | POA: Diagnosis not present

## 2022-08-10 DIAGNOSIS — R5383 Other fatigue: Secondary | ICD-10-CM | POA: Diagnosis not present

## 2022-08-10 DIAGNOSIS — K5900 Constipation, unspecified: Secondary | ICD-10-CM | POA: Diagnosis not present

## 2022-08-10 DIAGNOSIS — E039 Hypothyroidism, unspecified: Secondary | ICD-10-CM | POA: Diagnosis not present

## 2022-08-10 DIAGNOSIS — C3432 Malignant neoplasm of lower lobe, left bronchus or lung: Secondary | ICD-10-CM | POA: Diagnosis not present

## 2022-08-10 DIAGNOSIS — E876 Hypokalemia: Secondary | ICD-10-CM | POA: Diagnosis not present

## 2022-08-10 LAB — CBC WITH DIFFERENTIAL/PLATELET
Abs Immature Granulocytes: 0.01 10*3/uL (ref 0.00–0.07)
Basophils Absolute: 0 10*3/uL (ref 0.0–0.1)
Basophils Relative: 1 %
Eosinophils Absolute: 0.2 10*3/uL (ref 0.0–0.5)
Eosinophils Relative: 4 %
HCT: 39.1 % (ref 36.0–46.0)
Hemoglobin: 13.1 g/dL (ref 12.0–15.0)
Immature Granulocytes: 0 %
Lymphocytes Relative: 14 %
Lymphs Abs: 0.7 10*3/uL (ref 0.7–4.0)
MCH: 30.5 pg (ref 26.0–34.0)
MCHC: 33.5 g/dL (ref 30.0–36.0)
MCV: 91.1 fL (ref 80.0–100.0)
Monocytes Absolute: 0.3 10*3/uL (ref 0.1–1.0)
Monocytes Relative: 6 %
Neutro Abs: 3.5 10*3/uL (ref 1.7–7.7)
Neutrophils Relative %: 75 %
Platelets: 163 10*3/uL (ref 150–400)
RBC: 4.29 MIL/uL (ref 3.87–5.11)
RDW: 13.1 % (ref 11.5–15.5)
WBC: 4.7 10*3/uL (ref 4.0–10.5)
nRBC: 0 % (ref 0.0–0.2)

## 2022-08-10 LAB — COMPREHENSIVE METABOLIC PANEL
ALT: 10 U/L (ref 0–44)
AST: 15 U/L (ref 15–41)
Albumin: 3.5 g/dL (ref 3.5–5.0)
Alkaline Phosphatase: 65 U/L (ref 38–126)
Anion gap: 5 (ref 5–15)
BUN: 15 mg/dL (ref 8–23)
CO2: 22 mmol/L (ref 22–32)
Calcium: 8.9 mg/dL (ref 8.9–10.3)
Chloride: 109 mmol/L (ref 98–111)
Creatinine, Ser: 0.85 mg/dL (ref 0.44–1.00)
GFR, Estimated: >60 mL/min (ref >60)
Glucose, Bld: 98 mg/dL (ref 70–99)
Potassium: 3.5 mmol/L (ref 3.5–5.1)
Sodium: 136 mmol/L (ref 135–145)
Total Bilirubin: 1.6 mg/dL — ABNORMAL HIGH (ref 0.3–1.2)
Total Protein: 6.7 g/dL (ref 6.5–8.1)

## 2022-08-10 LAB — MAGNESIUM: Magnesium: 2.3 mg/dL (ref 1.7–2.4)

## 2022-08-10 LAB — TSH: TSH: 14.105 u[IU]/mL — ABNORMAL HIGH (ref 0.350–4.500)

## 2022-08-10 MED ORDER — HEPARIN SOD (PORK) LOCK FLUSH 100 UNIT/ML IV SOLN
500.0000 [IU] | Freq: Once | INTRAVENOUS | Status: AC
  Administered 2022-08-10: 500 [IU] via INTRAVENOUS

## 2022-08-10 MED ORDER — SODIUM CHLORIDE 0.9% FLUSH
10.0000 mL | INTRAVENOUS | Status: DC | PRN
  Administered 2022-08-10: 10 mL via INTRAVENOUS

## 2022-08-10 MED ORDER — AMOXICILLIN-POT CLAVULANATE 875-125 MG PO TABS: 1.0000 | ORAL_TABLET | Freq: Two times a day (BID) | ORAL | 0 refills | Status: DC

## 2022-08-10 NOTE — Progress Notes (Signed)
Patients port flushed without difficulty.  Good blood return noted with no bruising or swelling noted at site.  Patient remains accessed for chemotherapy treatment.  

## 2022-08-10 NOTE — Progress Notes (Signed)
Pt presents today for Imfinzi per provider's order. Pt's treatment will be held treatment today due to pneumonia per Dr.K.

## 2022-08-10 NOTE — Progress Notes (Signed)
Patient has been assessed, vital signs and labs have been reviewed by Dr. Ellin Saba. ANC, Creatinine, LFTs, and Platelets are within treatment parameters per Dr. Ellin Saba. Hold treatment today. Primary RN and pharmacy aware.

## 2022-08-10 NOTE — Patient Instructions (Addendum)
[facility]  Discharge Instructions  You were seen and examined today by Dr. Ellin Saba.  Dr. 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 It does look like you have pneumonia. You will not get treatment today.  Please call the surgeon who operated on your hip since you fell on it.  Follow-up as scheduled.  Thank you for choosing [facility] to provide your oncology and hematology care.   To afford each patient quality time with our provider, please arrive at least 15 minutes before your scheduled appointment time. You may need to reschedule your appointment if you arrive late (10 or more minutes). Arriving late affects you and other patients whose appointments are after yours.  Also, if you miss three or more appointments without notifying the office, you may be dismissed from the clinic at the provider's discretion.    Again, thank you for choosing [facility].  Our hope is that these requests will decrease the amount of time that you wait before being seen by our physicians.   If you have a lab appointment with the Cancer Center please come in thru the Main Entrance and check in at the main information desk.           _____________________________________________________________  Should you have questions after your visit to [facility], please contact our office at [phone] and follow the prompts.  Our office hours are 8:00 a.m. to 4:30 p.m. Monday - Thursday and 8:00 a.m. to 2:30 p.m. Friday.  Please note that voicemails left after 4:00 p.m. may not be returned until the following business day.  We are closed weekends and all major holidays.  You do have access to a nurse 24-7, just call the main number to the clinic [phone] and do not press any options, hold on the line and a nurse will answer the phone.    For prescription refill requests, have your  pharmacy contact our office and allow 72 hours.    Masks are optional in the cancer centers. If you would like for your care team to wear a mask while they are taking care of you, please let them know. You may have one support person who is at least 69 years old accompany you for your appointments.

## 2022-08-10 NOTE — Progress Notes (Signed)
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    06/24/2014 Breast US   Left breast: ill-defined predominantly hypoechoic area with some increased echogenicity in the 2 o'clock position of the left breast, 7 cm from the nipple. 2.6 x 2.3 x 1.8 cm in maximum dimensions.   07/01/2014 Initial Biopsy   Left breast core needle bx: Invasive mammary carcinoma with lobular features, ER+ (100%), PR+ (91%), HER2/neu negative (ratio 1.09), Ki-67 32%. E-cadherin strongly positive, diagnostic for IDC    07/01/2014 Clinical Stage   Stage IIA: T2 N0   08/20/2014 Definitive Surgery   Left breast seed localized lumpectomy/SLNB: IDC, +LVI, DCIS, 1 LN removed and positive for metastatic carcinoma. Grade 2. HER2/neu repeated and negative (ratio 0.69-1.9).    08/20/2014 Pathologic Stage   Stage IIB: pT2 pN1a pMx   09/17/2014 Surgery   Port-a cath placement and re-excision   10/02/2014 - 01/15/2015 Chemotherapy   CMF x 6 cycles.  patient refused Taxane-containing chemotherapy.   03/07/2015 Procedure   Comp Cancer Panel reveals no variant at APC, ATM, AXIN2, BARD1, BMPR1A, BRCA1, BRCA2, BRIP1, CDH1, CDK4, CDKN2A, CHEK2, FANCC, MLH1, MSH2, MSH6, MUTYH, NBN, PALB2, PMS2, POLD1, POLE, PTEN, RAD51C, RAD51D, SMAD4, STK11, TP53, VHL, and XRCC2.    04/06/2015 - 05/21/2015 Radiation Therapy    Adjuvant RT Pablo Ledger): Left breast/ 45 Gy over 25 fractions.   Left supraclavicular fossa and axilla/ 45 Gy over 25 fractions. Left breast boost/ 16 Gy over 8 fractions. Total dose: 61 Gy   06/04/2015 - 08/07/2015 Anti-estrogen oral therapy   Exemestane 25 mg daily.  Planned duration of therapy 5 years.    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  PET also demonstrated borderline right level 2 lymph node left supraclavicular, mediastinal and hilar lymphadenopathy and hypermetabolic left lower lobe pulmonary nodule all suspicious of metastatic focus.  No abdominal pelvic metastatic disease or osseous metastatic disease was appreciated.   08/31/2021 Initial Diagnosis   Squamous cell carcinoma of lung ([facility])   09/08/2021 Cancer Staging   Staging form: Lung, AJCC 8th Edition -  Clinical stage from 09/08/2021: Stage IIIB (cT1b, cN3, cM0) - Signed by Lonie Peak, MD on 09/08/2021 Stage prefix: Initial diagnosis   09/30/2021 Pathology Results   [facility] 360 Pathology PDL 1 29%   11/01/2021 - 11/23/2021 Chemotherapy   Patient is on Treatment Plan : LUNG Carboplatin / Paclitaxel + XRT q7d     02/01/2022 - 03/15/2022 Chemotherapy   Patient  is on Treatment Plan : LUNG Durvalumab q14d     02/01/2022 -  Chemotherapy   Patient is on Treatment Plan : LUNG Durvalumab (10) q14d       CANCER STAGING:  Cancer Staging  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, a 72 y.o. female, seen for follow-up and toxicity assessment prior to next cycle of immunotherapy for lung cancer.  She reportedly fell yesterday while she was standing up in the chair to grab something and the chair slipped.  She fell on the right hip on concrete floor and has some bruising.  She is able to walk.  She reports some fatigue.  She has some cough with yellow-green expectoration.  Denies any fever.  No immunotherapy related side effects.  REVIEW OF SYSTEMS:  Review of Systems  Constitutional:  Positive for fatigue.  Respiratory:  Positive for cough and shortness of breath (On exertion).   Gastrointestinal:  Positive for nausea.  Psychiatric/Behavioral:  Positive for depression and sleep disturbance.   All other systems reviewed and are negative.   PAST MEDICAL/SURGICAL HISTORY:  Past Medical History:  Diagnosis Date   Arthritis    Asthma    Asymptomatic varicose veins    Breast cancer (HCC) 06/2014   left   Cancer (HCC)    Supra Glottis   Chronic airway obstruction (HCC)    Complication of anesthesia 2016   difficulty waking up, Oxygen dropped low.   COPD (chronic obstructive pulmonary disease)  (HCC)    Depression    Dyspnea    GERD (gastroesophageal reflux disease)    Hemorrhage rectum    and anus.   Hypertension    Insomnia    Other symptoms involving digestive system(787.99)    Pain    Hx.pain in joint involving pelvic region and thigh; pain in limb   Palpitations    Panic attacks    Personal history of chemotherapy 2016   Personal history of radiation therapy 2016   Pneumonia    Pre-diabetes    Prediabetes    Sinusitis    Symptomatic menopausal or female climacteric states    Varicose veins of both lower extremities with pain    Past Surgical History:  Procedure Laterality Date   BREAST BIOPSY Left 10/2012   BREAST BIOPSY Left 07/01/2014   malignant   BREAST LUMPECTOMY Left 08/20/2014   BRONCHIAL BIOPSY  08/23/2021   Procedure: BRONCHIAL BIOPSIES;  Surgeon: Leslye Peer, MD;  Location: [facility] ENDOSCOPY;  Service: Pulmonary;;   BRONCHIAL BRUSHINGS  08/23/2021   Procedure: BRONCHIAL BRUSHINGS;  Surgeon:  Collene Gobble, MD;  Location: [facility] ENDOSCOPY;  Service: Pulmonary;;   BRONCHIAL NEEDLE ASPIRATION BIOPSY  08/23/2021   Procedure: BRONCHIAL NEEDLE ASPIRATION BIOPSIES;  Surgeon: Collene Gobble, MD;  Location[facility]COPY;  Service: Pulmonary;;   BRONCHIAL WASHINGS  08/23/2021   Procedure: BRONCHIAL WASHINGS;  Surgeon: Collene Gobble, MD;  Location: [facility] ENDOSCOPY;  Service: Pulmonary;;   CESAREAN SECTION     CHOLECYSTECTOMY  1985   COLONOSCOPY  2002   Dr. Lindalou Hose: internal hemorrhoids, one small rectal polyp, adenomatous path    COLONOSCOPY N/A 11/23/2015   Procedure: COLONOSCOPY;  Surgeon: Danie Binder, MD;  Location: [facility];  Service: Endoscopy;  Laterality: N/A;  1100 - moved to 10:45 - office to notify   ESOPHAGOGASTRODUODENOSCOPY N/A 11/23/2015   Procedure: ESOPHAGOGASTRODUODENOSCOPY (EGD);  Surgeon: Danie Binder, MD;  Location: [facility];  Service: Endoscopy;  Laterality: N/A;   IR IMAGING GUIDED PORT INSERTION  11/08/2021   PORT-A-CATH REMOVAL   02/2015   PORTACATH PLACEMENT Right 09/17/2014   Procedure: INSERTION PORT-A-CATH WITH ULTRASOUND;  Surgeon: Erroll Luna, MD;  Location: [facility];  Service: General;  Laterality: Right;  IJ   RADIOACTIVE SEED GUIDED PARTIAL MASTECTOMY WITH AXILLARY SENTINEL LYMPH NODE BIOPSY Left 08/20/2014   Procedure: LEFT BREAST LUMPECTOMY WITH RADIOACTIVE SEED LOCALIZATION AND SENTINEL LYMPH NODE MAPPING;  Surgeon: Erroll Luna, MD;  Location: [facility];  Service: General;  Laterality: Left;   RE-EXCISION OF BREAST LUMPECTOMY Left 09/17/2014   Procedure: RE-EXCISION OF BREAST LUMPECTOMY;  Surgeon: Erroll Luna, MD;  Location: [facility];  Service: General;  Laterality: Left;   TOTAL HIP ARTHROPLASTY Right 02/22/2021   Procedure: RIGHT TOTAL HIP ARTHROPLASTY ANTERIOR APPROACH;  Surgeon: Marybelle Killings, MD;  Location: [facility];  Service: Orthopedics;  Laterality: Right;   TUBAL LIGATION     VIDEO BRONCHOSCOPY WITH ENDOBRONCHIAL ULTRASOUND N/A 08/23/2021   Procedure: VIDEO BRONCHOSCOPY WITH ENDOBRONCHIAL ULTRASOUND;  Surgeon: Collene Gobble, MD;  Location: [facility] ENDOSCOPY;  Service: Pulmonary;  Laterality: N/A;   VIDEO BRONCHOSCOPY WITH RADIAL ENDOBRONCHIAL ULTRASOUND  08/23/2021   Procedure: VIDEO BRONCHOSCOPY WITH RADIAL ENDOBRONCHIAL ULTRASOUND;  Surgeon: Collene Gobble, MD;  Location: [facility] ENDOSCOPY;  Service: Pulmonary;;    SOCIAL HISTORY:  Social History   Socioeconomic History   Marital status: Divorced    Spouse name: Not on file   Number of children: 2   Years of education: Not on file   Highest education level: Not on file  Occupational History   Not on file  Tobacco Use   Smoking status: Former    Packs/day: 1.00    Years: 42.00    Total pack years: 42.00    Types: Cigarettes    Quit date: 02/22/2021    Years since quitting: 1.4   Smokeless tobacco: Never  Vaping Use   Vaping Use: Never used  Substance and Sexual Activity   Alcohol use: No     Alcohol/week: 0.0 standard drinks of alcohol   Drug use: No   Sexual activity: Never    Birth control/protection: None  Other Topics Concern   Not on file  Social History Narrative   Not on file   Social Determinants of Health   Financial Resource Strain: Not on file  Food Insecurity: Not on file  Transportation Needs: Not on file  Physical Activity: Not on file  Stress: Not on file  Social Connections: Unknown (08/05/2021)   Social Connection and Isolation Panel [  NHANES]    Frequency of Communication with Friends and Family: More than three times a week    Frequency of Social Gatherings with Friends and Family: More than three times a week    Attends Religious Services: Not on Marketing executive or Organizations: Not on file    Attends Banker Meetings: Not on file    Marital Status: Not on file  Intimate Partner Violence: Not on file    FAMILY HISTORY:  Family History  Problem Relation Age of Onset   Diabetes Mother    Ovarian cancer Mother 78   Other Father        died in MVA at age 7   Other Sister        mitral valve disorder   Melanoma Sister 76       removed from back of leg   Colon cancer Brother        early 79s, succumbed to disease   Cancer Paternal Aunt        leukemia, bone, or lung cancer   Alzheimer's disease Maternal Grandmother    Heart attack Maternal Grandfather    Heart attack Paternal Grandmother    COPD Paternal Grandfather    Emphysema Paternal Grandfather    Congestive Heart Failure Paternal Aunt    Stomach cancer Paternal Uncle    Kidney cancer Maternal Uncle    Cancer Cousin        dx. teens/dx. 40s   Cancer Cousin    Colon cancer Cousin     CURRENT MEDICATIONS:  Current Outpatient Medications  Medication Sig Dispense Refill   albuterol (PROVENTIL HFA;VENTOLIN HFA) 108 (90 BASE) MCG/ACT inhaler Inhale 2 puffs into the lungs every 6 (six) hours as needed for wheezing or shortness of breath.      albuterol  (VENTOLIN HFA) 108 (90 Base) MCG/ACT inhaler Inhale 2 puffs into the lungs every 4 (four) hours as needed.     ALPRAZolam (XANAX) 0.5 MG tablet Take 0.5 mg by mouth 3 (three) times daily.     ANORO ELLIPTA 62.5-25 MCG/INH AEPB Inhale 1 puff into the lungs daily.     calcium carbonate (TUMS - DOSED IN MG ELEMENTAL CALCIUM) 500 MG chewable tablet Chew 2 tablets by mouth 3 (three) times daily as needed for indigestion or heartburn.     fluconazole (DIFLUCAN) 100 MG tablet Take 2 tablets today, then 1 tablet daily x 20 more days. 22 tablet 0   furosemide (LASIX) 20 MG tablet Take 20 mg by mouth daily.     hydrochlorothiazide (HYDRODIURIL) 12.5 MG tablet Take 12.5 mg by mouth daily.     ibuprofen (ADVIL) 100 MG/5ML suspension Take 10 mLs (200 mg total) by mouth every 4 (four) hours as needed for moderate pain. 473 mL 0   lidocaine (XYLOCAINE) 2 % solution Patient: Mix 1part 2% viscous lidocaine, 1part H20. Swallow 26mL of diluted mixture, before meals and at bedtime, up to QID for sore throat. 200 mL 3   lidocaine-prilocaine (EMLA) cream Apply 1 application. topically as needed. 30 g 0   Menthol, Topical Analgesic, (BIOFREEZE EX) Apply 1 application topically daily as needed (pain).     nystatin (MYCOSTATIN) 100000 UNIT/ML suspension Take 5 mLs (500,000 Units total) by mouth 4 (four) times daily. 60 mL 0   ondansetron (ZOFRAN) 8 MG tablet Take 1 tablet (8 mg total) by mouth every 8 (eight) hours as needed for nausea or vomiting. 60 tablet 0  pantoprazole (PROTONIX) 40 MG tablet Take 1 tablet by mouth once daily 90 tablet 0   polyethylene glycol (MIRALAX / GLYCOLAX) 17 g packet Take 17 g by mouth daily.     Potassium Chloride 40 MEQ/15ML (20%) SOLN Take 20 mEq by mouth 2 (two) times daily. 473 mL 0   prochlorperazine (COMPAZINE) 10 MG tablet Take 1 tablet (10 mg total) by mouth every 6 (six) hours as needed for nausea or vomiting. 60 tablet 0   sertraline (ZOLOFT) 100 MG tablet Take 100 mg by mouth  daily.     sucralfate (CARAFATE) 1 g tablet Dissolve 1 tablet in 10 mL H20 and swallow 30 min prior to meals and bedtime. 40 tablet 3   Vitamin D, Ergocalciferol, 50000 units CAPS Take 1 capsule by mouth once a week.     No current facility-administered medications for this visit.    ALLERGIES:  No Known Allergies  PHYSICAL EXAM:  Performance status (ECOG): 1 - Symptomatic but completely ambulatory  There were no vitals filed for this visit.  Wt Readings from Last 3 Encounters:  08/10/22 189 lb (85.7 kg)  07/27/22 189 lb 3.2 oz (85.8 kg)  07/13/22 186 lb 12.8 oz (84.7 kg)   Physical Exam Vitals reviewed.  Constitutional:      Appearance: Normal appearance. She is obese.  Cardiovascular:     Rate and Rhythm: Normal rate and regular rhythm.     Pulses: Normal pulses.     Heart sounds: Normal heart sounds.  Pulmonary:     Effort: Pulmonary effort is normal.     Breath sounds: Normal breath sounds.  Neurological:     General: No focal deficit present.     Mental Status: She is alert and oriented to person, place, and time.  Psychiatric:        Mood and Affect: Mood normal.        Behavior: Behavior normal.     LABORATORY DATA:  I have reviewed the labs as listed.     Latest Ref Rng & Units 07/27/2022   12:14 PM 07/13/2022   12:06 PM 06/29/2022   12:30 PM  CBC  WBC 4.0 - 10.5 K/uL 4.1  5.0  4.8   Hemoglobin 12.0 - 15.0 g/dL 13.3  13.3  13.2   Hematocrit 36.0 - 46.0 % 39.6  39.8  39.9   Platelets 150 - 400 K/uL 178  180  169       Latest Ref Rng & Units 07/27/2022   12:14 PM 07/13/2022   12:06 PM 06/29/2022   12:30 PM  CMP  Glucose 70 - 99 mg/dL 99  111  112   BUN 8 - 23 mg/dL 14  13  17    Creatinine 0.44 - 1.00 mg/dL 0.74  0.82  0.78   Sodium 135 - 145 mmol/L 136  137  138   Potassium 3.5 - 5.1 mmol/L 4.1  4.1  3.8   Chloride 98 - 111 mmol/L 106  106  106   CO2 22 - 32 mmol/L 24  24  25    Calcium 8.9 - 10.3 mg/dL 8.8  9.0  9.1   Total Protein 6.5 - 8.1 g/dL 6.7   6.9  6.9   Total Bilirubin 0.3 - 1.2 mg/dL 1.6  2.0  1.5   Alkaline Phos 38 - 126 U/L 65  65  62   AST 15 - 41 U/L 13  13  12    ALT 0 - 44 U/L 9  9  8     DIAGNOSTIC IMAGING:  I have independently reviewed the scans and discussed with the patient. CT Chest W Contrast  Result Date: 08/04/2022 CLINICAL DATA:  Non-small-cell lung cancer and head neck cancer. Restaging. * Tracking Code: BO * EXAM: CT CHEST WITH CONTRAST TECHNIQUE: Multidetector CT imaging of the chest was performed during intravenous contrast administration. RADIATION DOSE REDUCTION: This exam was performed according to the departmental dose-optimization program which includes automated exposure control, adjustment of the mA and/or kV according to patient size and/or use of iterative reconstruction technique. CONTRAST:  37mL OMNIPAQUE IOHEXOL 300 MG/ML  SOLN COMPARISON:  05/20/2022 FINDINGS: Cardiovascular: Right Port-A-Cath tip superior caval/atrial junction. Aortic atherosclerosis. Tortuous thoracic aorta. Borderline cardiomegaly with similar small pericardial effusion. Left main and 3 vessel coronary artery calcification. No central pulmonary embolism, on this non-dedicated study. Mediastinum/Nodes: Dominant right-sided 3.4 cm thyroid nodule. This has been evaluated on previous imaging. (ref: J Am Coll Radiol. 2015 Feb;12(2): 143-50). No supraclavicular adenopathy. No axillary adenopathy. 1.0 cm precarinal node on 49/3 measured 1.1 cm on the prior. No hilar adenopathy. Lower paraesophageal soft tissue thickening on 72/3 is similar, favored to be radiation induced. Lungs/Pleura: Trace left pleural fluid or thickening is decreased. Moderate centrilobular emphysema. New or significantly increased inferior right middle lobe ground-glass and consolidation including on 89/5. Presumably radiation induced volume loss and mild consolidation within the paramediastinal anterior left upper lobe. Relatively similar appearance of left lower and less so  right paramediastinal lower lobe presumably radiation induced airspace and ground-glass opacity. No well-defined residual left lower lobe pulmonary nodule. Upper Abdomen: Cholecystectomy. Normal imaged portions of the liver, spleen, stomach, pancreas, adrenal glands, kidneys. Musculoskeletal: Left breast skin thickening with surgical clips and calcification, consistent with clinical history of prior lumpectomy and radiation therapy. Osteopenia. Mild superior endplate compression deformity at T5 with underlying subtle sclerosis. The compression deformity is felt to be new and the sclerosis is present back to 07/23/2021, with morphology favoring hemangioma. IMPRESSION: 1. Primarily similar left-greater-than-right lower lobe radiation change, without well-defined left lower lobe residual nodule. 2. Similar to minimal decrease in size of a borderline precarinal node. No new thoracic adenopathy. 3. New or significantly increased right middle lobe ground-glass and airspace disease, favoring superimposed pneumonia. 4. Mild superior endplate irregularity at T5, with a probable underlying hemangioma. Recommend attention on follow-up. 5. Similar small pericardial effusion 6. Aortic atherosclerosis (ICD10-I70.0), coronary artery atherosclerosis and emphysema (ICD10-J43.9). Electronically Signed   By: Abigail Miyamoto M.D.   On: 08/04/2022 14:09   CT SOFT TISSUE NECK W CONTRAST  Result Date: 08/04/2022 CLINICAL DATA:  72 year old female with non-small cell lung cancer. Head and neck cancer. Right vocal cord mass in 2022. Subsequent encounter. EXAM: CT NECK WITH CONTRAST TECHNIQUE: Multidetector CT imaging of the neck was performed using the standard protocol following the bolus administration of intravenous contrast. RADIATION DOSE REDUCTION: This exam was performed according to the departmental dose-optimization program which includes automated exposure control, adjustment of the mA and/or kV according to patient size and/or  use of iterative reconstruction technique. CONTRAST:  61mL OMNIPAQUE IOHEXOL 300 MG/ML  SOLN COMPARISON:  Chest CT the same day reported separately. PET-CT 01/27/2022. Neck CT 10/18/2021 and earlier. FINDINGS: Pharynx and larynx: Diffuse pharyngeal mucosal space thickening is new from last year compatible with interval radiation treatment. No discrete laryngeal or pharyngeal hyperenhancement or mass now. Parapharyngeal and retropharyngeal spaces are within normal limits (partially retropharyngeal course of the left ICA). Salivary glands: Sublingual space and submandibular glands  are within normal limits. Parotid glands are within normal limits. Thyroid: Chronic right thyroid goiter This has been evaluated on previous imaging. (ref: J Am Coll Radiol. 2015 Feb;12(2): 143-50). Lymph nodes: No cervical lymphadenopathy. Small bilateral level 2 A nodes with fatty hila have decreased in size from last year. Tiny level 1 lymph nodes are stable. Vascular: Right neck Port-A-Cath. Major vascular structures in the neck and at the skull base remain patent with bilateral carotid bifurcation atherosclerosis and partially retropharyngeal course of the left ICA. Dominant left vertebral artery again noted. Limited intracranial: Calcified atherosclerosis at the skull base. Grossly negative visible brain parenchyma. Visualized orbits: Negative. Mastoids and visualized paranasal sinuses: Visualized paranasal sinuses and mastoids are stable and well aerated. Chronic left maxillary periosteal thickening. Skeleton: Osteopenia. Absent dentition. Stable visualized osseous structures. Upper chest: Dedicated chest CT is reported separately. IMPRESSION: 1. Evidence of interval radiation therapy to the larynx and pharynx. Satisfactory post treatment appearance, no neck mass or lymphadenopathy identified. NI-RADS category 1. 2. CT Chest reported separately. Electronically Signed   By: Odessa Fleming M.D.   On: 08/04/2022 07:57     ASSESSMENT:  Stage  III squamous cell cancer: - LLL lung mass FNA (08/23/2021): Malignant cells consistent with squamous cell carcinoma. - Lymph node 4R and 7: Malignant cells consistent with NSCLC. - Chemoradiation therapy with weekly carboplatin and paclitaxel from 11/01/2021, week for chemotherapy on 11/23/2021.  XRT completed on 12/16/2021. - She missed further doses of chemotherapy secondary to thrombocytopenia. - PET scan (01/27/2022): Interval response to therapy with decreasing size of mediastinal lymph nodes.  No new disease.    Social/family history: - Seen today with her Sister [name].  Patient lives by herself.  Quit smoking on 02/22/2021.  Smoked 2 packs/day for 40 years. - Mother died of ovarian cancer.  Brother died of colon cancer at age 66.  Sister had melanoma.  Paternal uncles had stomach cancer and lung cancer.  Paternal aunt had thyroid cancer.  Another paternal aunt had pancreatic cancer.  3.  Supraglottic squamous cell carcinoma (T1N1): - Definitive radiation from 08/16/2021 through 09/13/2021  PLAN:  Stage III squamous cell lung cancer: - Reviewed CT chest (08/03/2022): Similar to minimal decrease in size of borderline precarinal lymph node.  Similar left greater than right lower lobe radiation change.  New right middle lobe groundglass and airspace disease favoring superimposed pneumonia.  Stable small pericardial effusion. - Because of CT scan findings and cough with yellow-green sputum, I will hold her treatment today.  Will treat her with Augmentin 875 twice daily for 7 days.  RTC 2 weeks to restart durvalumab.   2.  Supraglottic squamous cell carcinoma (T1N1): - Reviewed CT soft tissue neck from 08/03/2022 with no evidence of recurrence.  Prior PET scan also showed no evidence of recurrence. - Continue to follow-up with Dr. Jenne Pane.  3.  Throat pain and sternal chest pain: - Continue hydrocodone 10 mg liquid every 6 hours as needed.  4.  Hypokalemia: - Continue potassium 20 mEq daily.   Potassium is normal.  5.  Constipation: - Continue stool softener and MiraLAX.  6.  Hypothyroidism: - She started taking Synthroid 50 mcg daily about 3 weeks ago. - Today TSH improved to 14.  Continue Synthroid 50 mcg daily.    Orders placed this encounter:  No orders of the defined types were placed in this encounter.     Doreatha Massed, MD [facility] [phone]

## 2022-08-12 ENCOUNTER — Other Ambulatory Visit: Payer: Self-pay

## 2022-08-17 ENCOUNTER — Other Ambulatory Visit: Payer: Self-pay

## 2022-08-18 ENCOUNTER — Other Ambulatory Visit: Payer: Self-pay

## 2022-08-29 ENCOUNTER — Other Ambulatory Visit: Payer: Self-pay

## 2022-08-29 DIAGNOSIS — C3490 Malignant neoplasm of unspecified part of unspecified bronchus or lung: Secondary | ICD-10-CM

## 2022-08-30 ENCOUNTER — Inpatient Hospital Stay: Payer: Medicare HMO | Admitting: Hematology

## 2022-08-30 ENCOUNTER — Other Ambulatory Visit: Payer: Medicare HMO

## 2022-08-30 ENCOUNTER — Inpatient Hospital Stay: Payer: Medicare HMO | Attending: Hematology and Oncology

## 2022-08-30 ENCOUNTER — Inpatient Hospital Stay: Payer: Medicare HMO

## 2022-08-30 ENCOUNTER — Ambulatory Visit: Payer: Medicare HMO

## 2022-08-30 ENCOUNTER — Ambulatory Visit: Payer: Medicare HMO | Admitting: Hematology

## 2022-08-30 VITALS — BP 114/74 | HR 64 | Temp 97.1°F | Resp 18

## 2022-08-30 DIAGNOSIS — E876 Hypokalemia: Secondary | ICD-10-CM

## 2022-08-30 DIAGNOSIS — R0602 Shortness of breath: Secondary | ICD-10-CM | POA: Diagnosis not present

## 2022-08-30 DIAGNOSIS — Z833 Family history of diabetes mellitus: Secondary | ICD-10-CM | POA: Insufficient documentation

## 2022-08-30 DIAGNOSIS — R519 Headache, unspecified: Secondary | ICD-10-CM | POA: Insufficient documentation

## 2022-08-30 DIAGNOSIS — C50412 Malignant neoplasm of upper-outer quadrant of left female breast: Secondary | ICD-10-CM | POA: Diagnosis not present

## 2022-08-30 DIAGNOSIS — Z8051 Family history of malignant neoplasm of kidney: Secondary | ICD-10-CM | POA: Insufficient documentation

## 2022-08-30 DIAGNOSIS — I700 Atherosclerosis of aorta: Secondary | ICD-10-CM | POA: Diagnosis not present

## 2022-08-30 DIAGNOSIS — R059 Cough, unspecified: Secondary | ICD-10-CM | POA: Insufficient documentation

## 2022-08-30 DIAGNOSIS — R7989 Other specified abnormal findings of blood chemistry: Secondary | ICD-10-CM

## 2022-08-30 DIAGNOSIS — Z9049 Acquired absence of other specified parts of digestive tract: Secondary | ICD-10-CM | POA: Insufficient documentation

## 2022-08-30 DIAGNOSIS — C3490 Malignant neoplasm of unspecified part of unspecified bronchus or lung: Secondary | ICD-10-CM | POA: Diagnosis not present

## 2022-08-30 DIAGNOSIS — Z825 Family history of asthma and other chronic lower respiratory diseases: Secondary | ICD-10-CM | POA: Insufficient documentation

## 2022-08-30 DIAGNOSIS — Z79811 Long term (current) use of aromatase inhibitors: Secondary | ICD-10-CM | POA: Insufficient documentation

## 2022-08-30 DIAGNOSIS — I2510 Atherosclerotic heart disease of native coronary artery without angina pectoris: Secondary | ICD-10-CM | POA: Diagnosis not present

## 2022-08-30 DIAGNOSIS — M8580 Other specified disorders of bone density and structure, unspecified site: Secondary | ICD-10-CM | POA: Insufficient documentation

## 2022-08-30 DIAGNOSIS — E039 Hypothyroidism, unspecified: Secondary | ICD-10-CM | POA: Insufficient documentation

## 2022-08-30 DIAGNOSIS — R197 Diarrhea, unspecified: Secondary | ICD-10-CM | POA: Diagnosis not present

## 2022-08-30 DIAGNOSIS — K5900 Constipation, unspecified: Secondary | ICD-10-CM | POA: Diagnosis not present

## 2022-08-30 DIAGNOSIS — Z8521 Personal history of malignant neoplasm of larynx: Secondary | ICD-10-CM | POA: Insufficient documentation

## 2022-08-30 DIAGNOSIS — C321 Malignant neoplasm of supraglottis: Secondary | ICD-10-CM | POA: Diagnosis present

## 2022-08-30 DIAGNOSIS — C3432 Malignant neoplasm of lower lobe, left bronchus or lung: Secondary | ICD-10-CM | POA: Diagnosis present

## 2022-08-30 DIAGNOSIS — J432 Centrilobular emphysema: Secondary | ICD-10-CM | POA: Insufficient documentation

## 2022-08-30 DIAGNOSIS — D696 Thrombocytopenia, unspecified: Secondary | ICD-10-CM | POA: Insufficient documentation

## 2022-08-30 DIAGNOSIS — Z87891 Personal history of nicotine dependence: Secondary | ICD-10-CM | POA: Insufficient documentation

## 2022-08-30 DIAGNOSIS — I3139 Other pericardial effusion (noninflammatory): Secondary | ICD-10-CM | POA: Diagnosis not present

## 2022-08-30 DIAGNOSIS — Z79620 Long term (current) use of immunosuppressive biologic: Secondary | ICD-10-CM | POA: Insufficient documentation

## 2022-08-30 DIAGNOSIS — E669 Obesity, unspecified: Secondary | ICD-10-CM | POA: Diagnosis not present

## 2022-08-30 DIAGNOSIS — Z5112 Encounter for antineoplastic immunotherapy: Secondary | ICD-10-CM | POA: Diagnosis present

## 2022-08-30 DIAGNOSIS — R070 Pain in throat: Secondary | ICD-10-CM | POA: Diagnosis not present

## 2022-08-30 DIAGNOSIS — Z79899 Other long term (current) drug therapy: Secondary | ICD-10-CM | POA: Insufficient documentation

## 2022-08-30 DIAGNOSIS — M4854XA Collapsed vertebra, not elsewhere classified, thoracic region, initial encounter for fracture: Secondary | ICD-10-CM | POA: Insufficient documentation

## 2022-08-30 DIAGNOSIS — Z923 Personal history of irradiation: Secondary | ICD-10-CM | POA: Insufficient documentation

## 2022-08-30 DIAGNOSIS — Z8041 Family history of malignant neoplasm of ovary: Secondary | ICD-10-CM | POA: Insufficient documentation

## 2022-08-30 DIAGNOSIS — J90 Pleural effusion, not elsewhere classified: Secondary | ICD-10-CM | POA: Diagnosis not present

## 2022-08-30 DIAGNOSIS — F32A Depression, unspecified: Secondary | ICD-10-CM | POA: Diagnosis not present

## 2022-08-30 DIAGNOSIS — E042 Nontoxic multinodular goiter: Secondary | ICD-10-CM | POA: Insufficient documentation

## 2022-08-30 DIAGNOSIS — Z9221 Personal history of antineoplastic chemotherapy: Secondary | ICD-10-CM | POA: Insufficient documentation

## 2022-08-30 DIAGNOSIS — Z170 Estrogen receptor positive status [ER+]: Secondary | ICD-10-CM | POA: Diagnosis not present

## 2022-08-30 DIAGNOSIS — Z9851 Tubal ligation status: Secondary | ICD-10-CM | POA: Insufficient documentation

## 2022-08-30 DIAGNOSIS — Z8719 Personal history of other diseases of the digestive system: Secondary | ICD-10-CM | POA: Insufficient documentation

## 2022-08-30 DIAGNOSIS — Z800 Family history of malignant neoplasm of digestive organs: Secondary | ICD-10-CM | POA: Insufficient documentation

## 2022-08-30 DIAGNOSIS — Z801 Family history of malignant neoplasm of trachea, bronchus and lung: Secondary | ICD-10-CM | POA: Insufficient documentation

## 2022-08-30 DIAGNOSIS — Z8249 Family history of ischemic heart disease and other diseases of the circulatory system: Secondary | ICD-10-CM | POA: Insufficient documentation

## 2022-08-30 DIAGNOSIS — Z808 Family history of malignant neoplasm of other organs or systems: Secondary | ICD-10-CM | POA: Insufficient documentation

## 2022-08-30 LAB — TSH: TSH: 10.524 u[IU]/mL — ABNORMAL HIGH (ref 0.350–4.500)

## 2022-08-30 LAB — CBC WITH DIFFERENTIAL/PLATELET
Abs Immature Granulocytes: 0.01 10*3/uL (ref 0.00–0.07)
Basophils Absolute: 0.1 10*3/uL (ref 0.0–0.1)
Basophils Relative: 1 %
Eosinophils Absolute: 0.3 10*3/uL (ref 0.0–0.5)
Eosinophils Relative: 4 %
HCT: 44.2 % (ref 36.0–46.0)
Hemoglobin: 15.1 g/dL — ABNORMAL HIGH (ref 12.0–15.0)
Immature Granulocytes: 0 %
Lymphocytes Relative: 15 %
Lymphs Abs: 0.9 10*3/uL (ref 0.7–4.0)
MCH: 30.4 pg (ref 26.0–34.0)
MCHC: 34.2 g/dL (ref 30.0–36.0)
MCV: 89.1 fL (ref 80.0–100.0)
Monocytes Absolute: 0.4 10*3/uL (ref 0.1–1.0)
Monocytes Relative: 7 %
Neutro Abs: 4.3 10*3/uL (ref 1.7–7.7)
Neutrophils Relative %: 73 %
Platelets: 211 10*3/uL (ref 150–400)
RBC: 4.96 MIL/uL (ref 3.87–5.11)
RDW: 12.3 % (ref 11.5–15.5)
WBC: 5.8 10*3/uL (ref 4.0–10.5)
nRBC: 0 % (ref 0.0–0.2)

## 2022-08-30 LAB — COMPREHENSIVE METABOLIC PANEL
ALT: 9 U/L (ref 0–44)
AST: 15 U/L (ref 15–41)
Albumin: 4 g/dL (ref 3.5–5.0)
Alkaline Phosphatase: 80 U/L (ref 38–126)
Anion gap: 10 (ref 5–15)
BUN: 24 mg/dL — ABNORMAL HIGH (ref 8–23)
CO2: 25 mmol/L (ref 22–32)
Calcium: 9.4 mg/dL (ref 8.9–10.3)
Chloride: 100 mmol/L (ref 98–111)
Creatinine, Ser: 1.01 mg/dL — ABNORMAL HIGH (ref 0.44–1.00)
GFR, Estimated: >60 mL/min (ref >60)
Glucose, Bld: 108 mg/dL — ABNORMAL HIGH (ref 70–99)
Potassium: 3.3 mmol/L — ABNORMAL LOW (ref 3.5–5.1)
Sodium: 135 mmol/L (ref 135–145)
Total Bilirubin: 1.6 mg/dL — ABNORMAL HIGH (ref 0.3–1.2)
Total Protein: 7.5 g/dL (ref 6.5–8.1)

## 2022-08-30 LAB — MAGNESIUM: Magnesium: 2.2 mg/dL (ref 1.7–2.4)

## 2022-08-30 MED ORDER — SODIUM CHLORIDE 0.9% FLUSH
10.0000 mL | INTRAVENOUS | Status: DC | PRN
  Administered 2022-08-30: 10 mL

## 2022-08-30 MED ORDER — SODIUM CHLORIDE 0.9 % IV SOLN: Freq: Once | INTRAVENOUS | Status: AC

## 2022-08-30 MED ORDER — SODIUM CHLORIDE 0.9% FLUSH
10.0000 mL | Freq: Once | INTRAVENOUS | Status: AC
  Administered 2022-08-30: 10 mL via INTRAVENOUS

## 2022-08-30 MED ORDER — POTASSIUM CHLORIDE CRYS ER 20 MEQ PO TBCR
40.0000 meq | EXTENDED_RELEASE_TABLET | Freq: Once | ORAL | Status: AC
  Administered 2022-08-30: 40 meq via ORAL
  Filled 2022-08-30: qty 2

## 2022-08-30 MED ORDER — SODIUM CHLORIDE 0.9 % IV SOLN
10.0000 mg/kg | Freq: Once | INTRAVENOUS | Status: AC
  Administered 2022-08-30: 860 mg via INTRAVENOUS
  Filled 2022-08-30: qty 10

## 2022-08-30 MED ORDER — HEPARIN SOD (PORK) LOCK FLUSH 100 UNIT/ML IV SOLN
500.0000 [IU] | Freq: Once | INTRAVENOUS | Status: AC | PRN
  Administered 2022-08-30: 500 [IU]

## 2022-08-30 NOTE — Patient Instructions (Addendum)
[facility] at [facility] Discharge Instructions   You were seen and examined today by Dr. Katragadda.  He reviewed the results of your lab work which are normal/stable.   We will proceed with your treatment today.  Return as scheduled.    Thank you for choosing [facility] at [facility] to provide your oncology and hematology care.  To afford each patient quality time with our provider, please arrive at least 15 minutes before your scheduled appointment time.   If you have a lab appointment with the Cancer Center please come in thru the Main Entrance and check in at the main information desk.  You need to re-schedule your appointment should you arrive 10 or more minutes late.  We strive to give you quality time with our providers, and arriving late affects you and other patients whose appointments are after yours.  Also, if you no show three or more times for appointments you may be dismissed from the clinic at the providers discretion.     Again, thank you for choosing [facility].  Our hope is that these requests will decrease the amount of time that you wait before being seen by our physicians.       _____________________________________________________________  Should you have questions after your visit to [facility], please contact our office at [phone] and follow the prompts.  Our office hours are 8:00 a.m. and 4:30 p.m. Monday - Friday.  Please note that voicemails left after 4:00 p.m. may not be returned until the following business day.  We are closed weekends and major holidays.  You do have access to a nurse 24-7, just call the main number to the clinic [phone] and do not press any options, hold on the line and a nurse will answer the phone.    For prescription refill requests, have your pharmacy contact our office and allow 72 hours.    Due to Covid, you will need to wear a mask upon entering  the hospital. If you do not have a mask, a mask will be given to you at the Main Entrance upon arrival. For doctor visits, patients may have 1 support person age 18 or older with them. For treatment visits, patients can not have anyone with them due to social distancing guidelines and our immunocompromised population.      

## 2022-08-30 NOTE — Progress Notes (Signed)
Patient presents today for Imfinzi infusion.  Patient is in satisfactory condition with no new complaints voiced.  Vital signs are stable.  Labs reviewed by Dr. Delton Coombes during her office visit.  Bilirubin today is 1.6.  All other labs are within treatment parameters.  Potassium today is 3.3.  We will give Klor Con 40 mEq PO x one dose today per standing orders by Dr. Delton Coombes.  We will proceed with treatment per MD orders.

## 2022-08-30 NOTE — Patient Instructions (Signed)
[facility]  Discharge Instructions: Thank you for choosing [facility] to provide your oncology and hematology care.  If you have a lab appointment with the [facility], please come in thru the Main Entrance and check in at the main information desk.  Wear comfortable clothing and clothing appropriate for easy access to any Portacath or PICC line.   We strive to give you quality time with your provider. You may need to reschedule your appointment if you arrive late (15 or more minutes).  Arriving late affects you and other patients whose appointments are after yours.  Also, if you miss three or more appointments without notifying the office, you may be dismissed from the clinic at the provider's discretion.      For prescription refill requests, have your pharmacy contact our office and allow 72 hours for refills to be completed.    Today you received the following chemotherapy and/or immunotherapy agents Imfinzi.  Durvalumab Injection What is this medication? DURVALUMAB (dur VAL ue mab) treats some types of cancer. It works by helping your immune system slow or stop the spread of cancer cells. It is a monoclonal antibody. This medicine may be used for other purposes; ask your health care provider or pharmacist if you have questions. COMMON BRAND NAME(S): IMFINZI What should I tell my care team before I take this medication? They need to know if you have any of these conditions: Allogeneic stem cell transplant (uses someone else's stem cells) Autoimmune diseases, such as Crohn disease, ulcerative colitis, lupus History of chest radiation Nervous system problems, such as Guillain-Barre syndrome, myasthenia gravis Organ transplant An unusual or allergic reaction to durvalumab, other medications, foods, dyes, or preservatives Pregnant or trying to get pregnant Breast-feeding How should I use this medication? This medication is infused into a vein. It is  given by your care team in a hospital or clinic setting. A special MedGuide will be given to you before each treatment. Be sure to read this information carefully each time. Talk to your care team about the use of this medication in children. Special care may be needed. Overdosage: If you think you have taken too much of this medicine contact a poison control center or emergency room at once. NOTE: This medicine is only for you. Do not share this medicine with others. What if I miss a dose? Keep appointments for follow-up doses. It is important not to miss your dose. Call your care team if you are unable to keep an appointment. What may interact with this medication? Interactions have not been studied. This list may not describe all possible interactions. Give your health care provider a list of all the medicines, herbs, non-prescription drugs, or dietary supplements you use. Also tell them if you smoke, drink alcohol, or use illegal drugs. Some items may interact with your medicine. What should I watch for while using this medication? Your condition will be monitored carefully while you are receiving this medication. You may need blood work while taking this medication. This medication may cause serious skin reactions. They can happen weeks to months after starting the medication. Contact your care team right away if you notice fevers or flu-like symptoms with a rash. The rash may be red or purple and then turn into blisters or peeling of the skin. You may also notice a red rash with swelling of the face, lips, or lymph nodes in your neck or under your arms. Tell your care team right away  if you have any change in your eyesight. Talk to your care team if you may be pregnant. Serious birth defects can occur if you take this medication during pregnancy and for 3 months after the last dose. You will need a negative pregnancy test before starting this medication. Contraception is recommended while taking  this medication and for 3 months after the last dose. Your care team can help you find the option that works for you. Do not breastfeed while taking this medication and for 3 months after the last dose. What side effects may I notice from receiving this medication? Side effects that you should report to your care team as soon as possible: Allergic reactions--skin rash, itching, hives, swelling of the face, lips, tongue, or throat Dry cough, shortness of breath or trouble breathing Eye pain, redness, irritation, or discharge with blurry or decreased vision Heart muscle inflammation--unusual weakness or fatigue, shortness of breath, chest pain, fast or irregular heartbeat, dizziness, swelling of the ankles, feet, or hands Hormone gland problems--headache, sensitivity to light, unusual weakness or fatigue, dizziness, fast or irregular heartbeat, increased sensitivity to cold or heat, excessive sweating, constipation, hair loss, increased thirst or amount of urine, tremors or shaking, irritability Infusion reactions--chest pain, shortness of breath or trouble breathing, feeling faint or lightheaded Kidney injury (glomerulonephritis)--decrease in the amount of urine, red or dark [facility] urine, foamy or bubbly urine, swelling of the ankles, hands, or feet Liver injury--right upper belly pain, loss of appetite, nausea, light-colored stool, dark yellow or [facility] urine, yellowing skin or eyes, unusual weakness or fatigue Pain, tingling, or numbness in the hands or feet, muscle weakness, change in vision, confusion or trouble speaking, loss of balance or coordination, trouble walking, seizures Rash, fever, and swollen lymph nodes Redness, blistering, peeling, or loosening of the skin, including inside the mouth Sudden or severe stomach pain, bloody diarrhea, fever, nausea, vomiting Side effects that usually do not require medical attention (report these to your care team if they continue or are  bothersome): Bone, joint, or muscle pain Diarrhea Fatigue Loss of appetite Nausea Skin rash This list may not describe all possible side effects. Call your doctor for medical advice about side effects. You may report side effects to FDA at 1-800-FDA-1088. Where should I keep my medication? This medication is given in a hospital or clinic. It will not be stored at home. NOTE: This sheet is a summary. It may not cover all possible information. If you have questions about this medicine, talk to your doctor, pharmacist, or health care provider.  2023 Elsevier/Gold Standard (2021-11-01 00:00:00)        To help prevent nausea and vomiting after your treatment, we encourage you to take your nausea medication as directed.  BELOW ARE SYMPTOMS THAT SHOULD BE REPORTED IMMEDIATELY: *FEVER GREATER THAN 100.4 F (38 C) OR HIGHER *CHILLS OR SWEATING *NAUSEA AND VOMITING THAT IS NOT CONTROLLED WITH YOUR NAUSEA MEDICATION *UNUSUAL SHORTNESS OF BREATH *UNUSUAL BRUISING OR BLEEDING *URINARY PROBLEMS (pain or burning when urinating, or frequent urination) *BOWEL PROBLEMS (unusual diarrhea, constipation, pain near the anus) TENDERNESS IN MOUTH AND THROAT WITH OR WITHOUT PRESENCE OF ULCERS (sore throat, sores in mouth, or a toothache) UNUSUAL RASH, SWELLING OR PAIN  UNUSUAL VAGINAL DISCHARGE OR ITCHING   Items with * indicate a potential emergency and should be followed up as soon as possible or go to the Emergency Department if any problems should occur.  Please show the CHEMOTHERAPY ALERT CARD or IMMUNOTHERAPY ALERT CARD at check-in to  the Emergency Department and triage nurse.  Should you have questions after your visit or need to cancel or reschedule your appointment, please contact [facility] [phone]  and follow the prompts.  Office hours are 8:00 a.m. to 4:30 p.m. Monday - Friday. Please note that voicemails left after 4:00 p.m. may not be returned until the following  business day.  We are closed weekends and major holidays. You have access to a nurse at all times for urgent questions. Please call the main number to the clinic [phone] and follow the prompts.  For any non-urgent questions, you may also contact your provider using MyChart. We now offer e-Visits for anyone 27 and older to request care online for non-urgent symptoms. For details visit [email or web address].   Also download the MyChart app! Go to the app store, search "MyChart", open the app, select [facility], and log in with your MyChart username and password.

## 2022-08-30 NOTE — Progress Notes (Signed)
Patient presents today for Imfinzi infusion.  Patient is in satisfactory condition with no complaints voiced.  Vital signs are stable.  Labs reviewed by Dr. Delton Coombes during her office visit.  Bilirubin is 1.6.  Potassium today is 3.3  We will give NS 500 mL x one for elevated creatinine levels.  We will also give Klor Con 40 mEq PO x one dose today for hypokalemia.  We will proceed with treatment per MD orders.

## 2022-08-30 NOTE — Progress Notes (Unsigned)
Patient has been examined by Dr. Katragadda, and vital signs and labs have been reviewed. ANC, Creatinine, LFTs, hemoglobin, and platelets are within treatment parameters per M.D. - pt may proceed with treatment.  Primary RN and pharmacy notified.  

## 2022-08-30 NOTE — Progress Notes (Signed)
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    06/24/2014 Breast US   Left breast: ill-defined predominantly hypoechoic area with some increased echogenicity in the 2 o'clock position of the left breast, 7 cm from the nipple. 2.6 x 2.3 x 1.8 cm in maximum dimensions.   07/01/2014 Initial Biopsy   Left breast core needle bx: Invasive mammary carcinoma with lobular features, ER+ (100%), PR+ (91%), HER2/neu negative (ratio 1.09), Ki-67 32%. E-cadherin strongly positive, diagnostic for IDC    07/01/2014 Clinical Stage   Stage IIA: T2 N0   08/20/2014 Definitive Surgery   Left breast seed localized lumpectomy/SLNB: IDC, +LVI, DCIS, 1 LN removed and positive for metastatic carcinoma. Grade 2. HER2/neu repeated and negative (ratio 0.69-1.9).    08/20/2014 Pathologic Stage   Stage IIB: pT2 pN1a pMx   09/17/2014 Surgery   Port-a cath placement and re-excision   10/02/2014 - 01/15/2015 Chemotherapy   CMF x 6 cycles.  patient refused Taxane-containing chemotherapy.   03/07/2015 Procedure   Comp Cancer Panel reveals no variant at APC, ATM, AXIN2, BARD1, BMPR1A, BRCA1, BRCA2, BRIP1, CDH1, CDK4, CDKN2A, CHEK2, FANCC, MLH1, MSH2, MSH6, MUTYH, NBN, PALB2, PMS2, POLD1, POLE, PTEN, RAD51C, RAD51D, SMAD4, STK11, TP53, VHL, and XRCC2.    04/06/2015 - 05/21/2015 Radiation Therapy    Adjuvant RT Pablo Ledger): Left breast/ 45 Gy over 25 fractions.   Left supraclavicular fossa and axilla/ 45 Gy over 25 fractions. Left breast boost/ 16 Gy over 8 fractions. Total dose: 61 Gy   06/04/2015 - 08/07/2015 Anti-estrogen oral therapy   Exemestane 25 mg daily.  Planned duration of therapy 5 years.    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  PET also demonstrated borderline right level 2 lymph node left supraclavicular, mediastinal and hilar lymphadenopathy and hypermetabolic left lower lobe pulmonary nodule all suspicious of metastatic focus.  No abdominal pelvic metastatic disease or osseous metastatic disease was appreciated.   08/31/2021 Initial Diagnosis   Squamous cell carcinoma of lung ([facility])   09/08/2021 Cancer Staging   Staging form: Lung, AJCC 8th Edition -  Clinical stage from 09/08/2021: Stage IIIB (cT1b, cN3, cM0) - Signed by Lonie Peak, MD on 09/08/2021 Stage prefix: Initial diagnosis   09/30/2021 Pathology Results   [facility] Pathology PDL 1 29%   11/01/2021 - 11/23/2021 Chemotherapy   Patient is on Treatment Plan : LUNG Carboplatin / Paclitaxel + XRT q7d     02/01/2022 - 03/15/2022 Chemotherapy   Patient  is on Treatment Plan : LUNG Durvalumab q14d     02/01/2022 -  Chemotherapy   Patient is on Treatment Plan : LUNG Durvalumab (10) q14d       CANCER STAGING:  Cancer Staging  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 female, seen for follow-up and toxicity assessment prior to next cycle of immunotherapy for lung cancer.  Reports energy levels of 60%.  Throat pain has been stable.  She reports that cough is better.  REVIEW OF SYSTEMS:  Review of Systems  HENT:   Positive for sore throat.   Respiratory:  Positive for cough and shortness of breath (On exertion).   Gastrointestinal:  Positive for constipation and diarrhea.  Neurological:  Positive for headaches.  Psychiatric/Behavioral:  Positive for depression. The patient is nervous/anxious.   All other systems reviewed and are negative.   PAST MEDICAL/SURGICAL HISTORY:  Past Medical History:  Diagnosis Date   Arthritis    Asthma    Asymptomatic varicose veins    Breast cancer (HCC) 06/2014   left   Cancer (HCC)    Supra Glottis   Chronic airway obstruction (HCC)    Complication of anesthesia 2016   difficulty waking up, Oxygen dropped low.   COPD (chronic obstructive pulmonary disease) (HCC)    Depression    Dyspnea    GERD (gastroesophageal reflux disease)    Hemorrhage rectum    and anus.   Hypertension    Insomnia    Other symptoms involving digestive  system(787.99)    Pain    Hx.pain in joint involving pelvic region and thigh; pain in limb   Palpitations    Panic attacks    Personal history of chemotherapy 2016   Personal history of radiation therapy 2016   Pneumonia    Pre-diabetes    Prediabetes    Sinusitis    Symptomatic menopausal or female climacteric states    Varicose veins of both lower extremities with pain    Past Surgical History:  Procedure Laterality Date   BREAST BIOPSY Left 10/2012   BREAST BIOPSY Left 07/01/2014   malignant   BREAST LUMPECTOMY Left 08/20/2014   BRONCHIAL BIOPSY  08/23/2021   Procedure: BRONCHIAL BIOPSIES;  Surgeon: Leslye Peer, MD;  Location: [facility] END[facility];  Service: Pulmonary;;   BRONCHIAL BRUSHINGS  08/23/2021   Procedure: BRONCHIAL BRUSHINGS;  Surgeon: Leslye Peer, MD;  Location: [facility] ENDOSCOPY;  Service: Pulmonary;;   BRONCHIAL NEEDLE ASPIRATION BIOPSY  08/23/2021   Procedure: BRONCHIAL NEEDLE ASPIRATION BIOPSIES;  Surgeon: Leslye Peer, MD;  Location: [facility] ENDOSCOPY;  Service: Pulmonary;;   BRONCHIAL WASHINGS  08/23/2021   Procedure: BRONCHIAL WASHINGS;  Surgeon: Leslye Peer, MD;  Location: [facility] ENDOSCOPY;  Service: Pulmonary;;   CESAREAN SECTION     CHOLECYSTECTOMY  1985   COLONOSCOPY  2002   Dr. Cleotis Nipper: internal hemorrhoids, one small rectal polyp, adenomatous path    COLONOSCOPY N/A 11/23/2015   Procedure: COLONOSCOPY;  Surgeon: West Bali, MD;  Location: [facility];  Service: Endoscopy;  Laterality: N/A;  1100 - moved to 10:45 - office to notify   ESOPHAGOGASTRODUODENOSCOPY N/A 11/23/2015   Procedure: ESOPHAGOGASTRODUODENOSCOPY (EGD);  Surgeon: West Bali, MD;  Location: [facility];  Service: Endoscopy;  Laterality: N/A;   IR IMAGING GUIDED PORT INSERTION  11/08/2021   PORT-A-CATH REMOVAL  02/2015   PORTACATH PLACEMENT Right 09/17/2014   Procedure: INSERTION PORT-A-CATH WITH ULTRASOUND;  Surgeon: Harriette Bouillon, MD;  Location: [facility];  Service:  General;  Laterality: Right;  IJ   RADIOACTIVE SEED GUIDED PARTIAL MASTECTOMY WITH AXILLARY SENTINEL LYMPH NODE BIOPSY Left 08/20/2014   Procedure: LEFT BREAST LUMPECTOMY WITH RADIOACTIVE SEED LOCALIZATION AND SENTINEL LYMPH NODE MAPPING;  Surgeon: Harriette Bouillon, MD;  Location: [facility];  Service: General;  Laterality: Left;   RE-EXCISION OF BREAST LUMPECTOMY Left 09/17/2014   Procedure: RE-EXCISION OF BREAST LUMPECTOMY;  Surgeon: Harriette Bouillon, MD;  Location: [facility];  Service: General;  Laterality: Left;   TOTAL HIP ARTHROPLASTY Right 02/22/2021   Procedure: RIGHT TOTAL HIP ARTHROPLASTY ANTERIOR APPROACH;  Surgeon: Eldred Manges, MD;  Location: [facility] OR;  Service: Orthopedics;  Laterality: Right;   TUBAL LIGATION     VIDEO BRONCHOSCOPY WITH ENDOBRONCHIAL ULTRASOUND N/A 08/23/2021   Procedure: VIDEO BRONCHOSCOPY WITH ENDOBRONCHIAL ULTRASOUND;  Surgeon: Leslye Peer, MD;  Location: [facility] ENDOS[facility];  Service: Pulmonary;  Laterality: N/A;   VIDEO BRONCHOSCOPY WITH RADIAL ENDOBRONCHIAL ULTRASOUND  08/23/2021   Procedure: VIDEO BRONCHOSCOPY WITH RADIAL ENDOBRONCHIAL ULTRASOUND;  Surgeon: Leslye Peer, MD;  Location: [facility] END[facility];  Service: Pulmonary;;    SOCIAL HISTORY:  Social History   Socioeconomic History   Marital status: Divorced    Spouse name: Not on file   Number of children: 2   Years of education: Not on file   Highest education level: Not on file  Occupational History   Not on file  Tobacco Use   Smoking status: Former    Packs/day: 1.00    Years: 42.00    Total pack years: 42.00    Types: Cigarettes    Quit date: 02/22/2021    Years since quitting: 1.5   Smokeless tobacco: Never  Vaping Use   Vaping Use: Never used  Substance and Sexual Activity   Alcohol use: No    Alcohol/week: 0.0 standard drinks of alcohol   Drug use: No   Sexual activity: Never    Birth control/protection: None  Other Topics Concern   Not on file  Social History  Narrative   Not on file   Social Determinants of Health   Financial Resource Strain: Not on file  Food Insecurity: Not on file  Transportation Needs: Not on file  Physical Activity: Not on file  Stress: Not on file  Social Connections: Unknown (08/05/2021)   Social Connection and Isolation Panel [NHANES]    Frequency of Communication with Friends and Family: More than three times a week    Frequency of Social Gatherings with Friends and Family: More than three times a  week    Attends Religious Services: Not on file    Active Member of Clubs or Organizations: Not on file    Attends Banker Meetings: Not on file    Marital Status: Not on file  Intimate Partner Violence: Not on file    FAMILY HISTORY:  Family History  Problem Relation Age of Onset   Diabetes Mother    Ovarian cancer Mother 62   Other Father        died in MVA at age 2   Other Sister        mitral valve disorder   Melanoma Sister 21       removed from back of leg   Colon cancer Brother        early 77s, succumbed to disease   Cancer Paternal Aunt        leukemia, bone, or lung cancer   Alzheimer's disease Maternal Grandmother    Heart attack Maternal Grandfather    Heart attack Paternal Grandmother    COPD Paternal Grandfather    Emphysema Paternal Grandfather    Congestive Heart Failure Paternal Aunt    Stomach cancer Paternal Uncle    Kidney cancer Maternal Uncle    Cancer Cousin        dx. teens/dx. 40s   Cancer Cousin    Colon cancer Cousin     CURRENT MEDICATIONS:  Current Outpatient Medications  Medication Sig Dispense Refill   albuterol (PROVENTIL HFA;VENTOLIN HFA) 108 (90 BASE) MCG/ACT inhaler Inhale 2 puffs into the lungs every 6 (six) hours as needed for wheezing or shortness of breath.      albuterol (VENTOLIN HFA) 108 (90 Base) MCG/ACT inhaler Inhale 2 puffs into the lungs every 4 (four) hours as needed.     ALPRAZolam (XANAX) 0.5 MG tablet Take 0.5 mg by mouth 3 (three)  times daily.     amoxicillin-clavulanate (AUGMENTIN) 875-125 MG tablet Take 1 tablet by mouth 2 (two) times daily. 14 tablet 0   ANORO ELLIPTA 62.5-25 MCG/INH AEPB Inhale 1 puff into the lungs daily.     calcium carbonate (TUMS - DOSED IN MG ELEMENTAL CALCIUM) 500 MG chewable tablet Chew 2 tablets by mouth 3 (three) times daily as needed for indigestion or heartburn.     fluconazole (DIFLUCAN) 100 MG tablet Take 2 tablets today, then 1 tablet daily x 20 more days. 22 tablet 0   furosemide (LASIX) 20 MG tablet Take 20 mg by mouth daily.     hydrochlorothiazide (HYDRODIURIL) 12.5 MG tablet Take 12.5 mg by mouth daily.     ibuprofen (ADVIL) 100 MG/5ML suspension Take 10 mLs (200 mg total) by mouth every 4 (four) hours as needed for moderate pain. 473 mL 0   lidocaine (XYLOCAINE) 2 % solution Patient: Mix 1part 2% viscous lidocaine, 1part H20. Swallow 41mL of diluted mixture, before meals and at bedtime, up to QID for sore throat. 200 mL 3   lidocaine-prilocaine (EMLA) cream Apply 1 application. topically as needed. 30 g 0   Menthol, Topical Analgesic, (BIOFREEZE EX) Apply 1 application topically daily as needed (pain).     nystatin (MYCOSTATIN) 100000 UNIT/ML suspension Take 5 mLs (500,000 Units total) by mouth 4 (four) times daily. 60 mL 0   ondansetron (ZOFRAN) 8 MG tablet Take 1 tablet (8 mg total) by mouth every 8 (eight) hours as needed for nausea or vomiting. 60 tablet 0   pantoprazole (PROTONIX) 40 MG tablet Take 1 tablet by mouth once daily 90  tablet 0   polyethylene glycol (MIRALAX / GLYCOLAX) 17 g packet Take 17 g by mouth daily.     Potassium Chloride 40 MEQ/15ML (20%) SOLN Take 20 mEq by mouth 2 (two) times daily. 473 mL 0   prochlorperazine (COMPAZINE) 10 MG tablet Take 1 tablet (10 mg total) by mouth every 6 (six) hours as needed for nausea or vomiting. 60 tablet 0   sertraline (ZOLOFT) 100 MG tablet Take 100 mg by mouth daily.     sucralfate (CARAFATE) 1 g tablet Dissolve 1 tablet  in 10 mL H20 and swallow 30 min prior to meals and bedtime. 40 tablet 3   Vitamin D, Ergocalciferol, 50000 units CAPS Take 1 capsule by mouth once a week.     No current facility-administered medications for this visit.    ALLERGIES:  No Known Allergies  PHYSICAL EXAM:  Performance status (ECOG): 1 - Symptomatic but completely ambulatory  There were no vitals filed for this visit.  Wt Readings from Last 3 Encounters:  08/30/22 185 lb 6.4 oz (84.1 kg)  08/10/22 189 lb (85.7 kg)  07/27/22 189 lb 3.2 oz (85.8 kg)   Physical Exam Vitals reviewed.  Constitutional:      Appearance: Normal appearance. She is obese.  Cardiovascular:     Rate and Rhythm: Normal rate and regular rhythm.     Pulses: Normal pulses.     Heart sounds: Normal heart sounds.  Pulmonary:     Effort: Pulmonary effort is normal.     Breath sounds: Normal breath sounds.  Neurological:     General: No focal deficit present.     Mental Status: She is alert and oriented to person, place, and time.  Psychiatric:        Mood and Affect: Mood normal.        Behavior: Behavior normal.    LABORATORY DATA:  I have reviewed the labs as listed.     Latest Ref Rng & Units 08/30/2022    9:22 AM 08/10/2022   11:01 AM 07/27/2022   12:14 PM  CBC  WBC 4.0 - 10.5 K/uL 5.8  4.7  4.1   Hemoglobin 12.0 - 15.0 g/dL 16.1  09.6  04.5   Hematocrit 36.0 - 46.0 % 44.2  39.1  39.6   Platelets 150 - 400 K/uL 211  163  178       Latest Ref Rng & Units 08/10/2022   11:01 AM 07/27/2022   12:14 PM 07/13/2022   12:06 PM  CMP  Glucose 70 - 99 mg/dL 98  99  409   BUN 8 - 23 mg/dL Creatinine 0.44 - 1.00 mg/dL 8.11  9.14  7.82   Sodium 135 - 145 mmol/L 136  136  137   Potassium 3.5 - 5.1 mmol/L 3.5  4.1  4.1   Chloride 98 - 111 mmol/L 109  106  106   CO2 22 - 32 mmol/L Calcium 8.9 - 10.3 mg/dL 8.9  8.8  9.0   Total Protein 6.5 - 8.1 g/dL 6.7  6.7  6.9   Total Bilirubin 0.3 - 1.2 mg/dL 1.6  1.6  2.0    Alkaline Phos 38 - 126 U/L 65  65  65   AST 15 - 41 U/L ALT 0 - 44 U/L DIAGNOSTIC IMAGING:  I have independently  reviewed the scans and discussed with the patient. CT Chest W Contrast  Result Date: 08/04/2022 CLINICAL DATA:  Non-small-cell lung cancer and head neck cancer. Restaging. * Tracking Code: BO * EXAM: CT CHEST WITH CONTRAST TECHNIQUE: Multidetector CT imaging of the chest was performed during intravenous contrast administration. RADIATION DOSE REDUCTION: This exam was performed according to the departmental dose-optimization program which includes automated exposure control, adjustment of the mA and/or kV according to patient size and/or use of iterative reconstruction technique. CONTRAST:  75mL OMNIPAQUE IOHEXOL 300 MG/ML  SOLN COMPARISON:  05/20/2022 FINDINGS: Cardiovascular: Right Port-A-Cath tip superior caval/atrial junction. Aortic atherosclerosis. Tortuous thoracic aorta. Borderline cardiomegaly with similar small pericardial effusion. Left main and 3 vessel coronary artery calcification. No central pulmonary embolism, on this non-dedicated study. Mediastinum/Nodes: Dominant right-sided 3.4 cm thyroid nodule. This has been evaluated on previous imaging. (ref: J Am Coll Radiol. 2015 Feb;12(2): 143-50). No supraclavicular adenopathy. No axillary adenopathy. 1.0 cm precarinal node on 49/3 measured 1.1 cm on the prior. No hilar adenopathy. Lower paraesophageal soft tissue thickening on 72/3 is similar, favored to be radiation induced. Lungs/Pleura: Trace left pleural fluid or thickening is decreased. Moderate centrilobular emphysema. New or significantly increased inferior right middle lobe ground-glass and consolidation including on 89/5. Presumably radiation induced volume loss and mild consolidation within the paramediastinal anterior left upper lobe. Relatively similar appearance of left lower and less so right paramediastinal lower lobe presumably radiation  induced airspace and ground-glass opacity. No well-defined residual left lower lobe pulmonary nodule. Upper Abdomen: Cholecystectomy. Normal imaged portions of the liver, spleen, stomach, pancreas, adrenal glands, kidneys. Musculoskeletal: Left breast skin thickening with surgical clips and calcification, consistent with clinical history of prior lumpectomy and radiation therapy. Osteopenia. Mild superior endplate compression deformity at T5 with underlying subtle sclerosis. The compression deformity is felt to be new and the sclerosis is present back to 07/23/2021, with morphology favoring hemangioma. IMPRESSION: 1. Primarily similar left-greater-than-right lower lobe radiation change, without well-defined left lower lobe residual nodule. 2. Similar to minimal decrease in size of a borderline precarinal node. No new thoracic adenopathy. 3. New or significantly increased right middle lobe ground-glass and airspace disease, favoring superimposed pneumonia. 4. Mild superior endplate irregularity at T5, with a probable underlying hemangioma. Recommend attention on follow-up. 5. Similar small pericardial effusion 6. Aortic atherosclerosis (ICD10-I70.0), coronary artery atherosclerosis and emphysema (ICD10-J43.9). Electronically Signed   By: Jeronimo Greaves M.D.   On: 08/04/2022 14:09   CT SOFT TISSUE NECK W CONTRAST  Result Date: 08/04/2022 CLINICAL DATA:  72 year old female with non-small cell lung cancer. Head and neck cancer. Right vocal cord mass in 2022. Subsequent encounter. EXAM: CT NECK WITH CONTRAST TECHNIQUE: Multidetector CT imaging of the neck was performed using the standard protocol following the bolus administration of intravenous contrast. RADIATION DOSE REDUCTION: This exam was performed according to the departmental dose-optimization program which includes automated exposure control, adjustment of the mA and/or kV according to patient size and/or use of iterative reconstruction technique. CONTRAST:   75mL OMNIPAQUE IOHEXOL 300 MG/ML  SOLN COMPARISON:  Chest CT the same day reported separately. PET-CT 01/27/2022. Neck CT 10/18/2021 and earlier. FINDINGS: Pharynx and larynx: Diffuse pharyngeal mucosal space thickening is new from last year compatible with interval radiation treatment. No discrete laryngeal or pharyngeal hyperenhancement or mass now. Parapharyngeal and retropharyngeal spaces are within normal limits (partially retropharyngeal course of the left ICA). Salivary glands: Sublingual space and submandibular glands are within normal limits. Parotid glands are within normal limits. Thyroid: Chronic right  thyroid goiter This has been evaluated on previous imaging. (ref: J Am Coll Radiol. 2015 Feb;12(2): 143-50). Lymph nodes: No cervical lymphadenopathy. Small bilateral level 2 A nodes with fatty hila have decreased in size from last year. Tiny level 1 lymph nodes are stable. Vascular: Right neck Port-A-Cath. Major vascular structures in the neck and at the skull base remain patent with bilateral carotid bifurcation atherosclerosis and partially retropharyngeal course of the left ICA. Dominant left vertebral artery again noted. Limited intracranial: Calcified atherosclerosis at the skull base. Grossly negative visible brain parenchyma. Visualized orbits: Negative. Mastoids and visualized paranasal sinuses: Visualized paranasal sinuses and mastoids are stable and well aerated. Chronic left maxillary periosteal thickening. Skeleton: Osteopenia. Absent dentition. Stable visualized osseous structures. Upper chest: Dedicated chest CT is reported separately. IMPRESSION: 1. Evidence of interval radiation therapy to the larynx and pharynx. Satisfactory post treatment appearance, no neck mass or lymphadenopathy identified. NI-RADS category 1. 2. CT Chest reported separately. Electronically Signed   By: Odessa Fleming M.D.   On: 08/04/2022 07:57     ASSESSMENT:  Stage III squamous cell cancer: - LLL lung mass FNA  (08/23/2021): Malignant cells consistent with squamous cell carcinoma. - Lymph node 4R and 7: Malignant cells consistent with NSCLC. - Chemoradiation therapy with weekly carboplatin and paclitaxel from 11/01/2021, week for chemotherapy on 11/23/2021.  XRT completed on 12/16/2021. - She missed further doses of chemotherapy secondary to thrombocytopenia. - PET scan (01/27/2022): Interval response to therapy with decreasing size of mediastinal lymph nodes.  No new disease.    Social/family history: - Seen today with her Sister Regina Mcmahon.  Patient lives by herself.  Quit smoking on 02/22/2021.  Smoked 2 packs/day for 40 years. - Mother died of ovarian cancer.  Brother died of colon cancer at age 33.  Sister had melanoma.  Paternal uncles had stomach cancer and lung cancer.  Paternal aunt had thyroid cancer.  Another paternal aunt had pancreatic cancer.  3.  Supraglottic squamous cell carcinoma (T1N1): - Definitive radiation from 08/16/2021 through 09/13/2021  PLAN:  Stage III squamous cell lung cancer: - CT chest (08/03/2022): Similar to minimal decrease in size of borderline precarinal lymph node.  Similar left greater than right lower lobe radiation change.  New right middle lobe groundglass and airspace disease favoring superimposed pneumonia.  Stable small pleural effusion. - She has completed Augmentin.  She no longer has expectoration or hemoptysis. - Reviewed labs today which showed normal CBC.  Creatinine is elevated at 1.01. - She may proceed with next cycle of durvalumab.  She will receive 500 mL of normal saline.  Continue treatment in the next 2 weeks.  RTC 4 weeks for follow-up.  Repeat scans in 3 months from the last.   2.  Supraglottic squamous cell carcinoma (T1N1): - CT soft tissue neck (08/03/2022): No evidence of recurrence. - Continue follow-up with Dr. Jenne Pane.  3.  Throat pain and sternal chest pain: - Continue hydrocodone liquid every 6 hours.  4.  Hypokalemia: - Continue potassium 20  mEq daily.  Potassium is slightly low at 3.3 today.  She will receive extra potassium.  5.  Constipation: - Continue stool softener and MiraLAX.  6.  Hypothyroidism: - She is taking Synthroid 50 mcg daily.  TSH today further improved to 10.5.    Orders placed this encounter:  No orders of the defined types were placed in this encounter.     Doreatha Massed, MD [facility] [phone]

## 2022-08-31 ENCOUNTER — Encounter: Payer: Self-pay | Admitting: Hematology

## 2022-08-31 ENCOUNTER — Encounter: Payer: Self-pay | Admitting: Hematology and Oncology

## 2022-08-31 ENCOUNTER — Other Ambulatory Visit: Payer: Self-pay

## 2022-09-01 LAB — T4: T4, Total: 7.5 ug/dL (ref 4.5–12.0)

## 2022-09-05 ENCOUNTER — Other Ambulatory Visit: Payer: Self-pay | Admitting: *Deleted

## 2022-09-05 MED ORDER — HYDROCODONE-ACETAMINOPHEN 7.5-325 MG/15ML PO SOLN: 15.0000 mL | Freq: Four times a day (QID) | ORAL | 0 refills | Status: DC | PRN

## 2022-09-11 ENCOUNTER — Other Ambulatory Visit: Payer: Self-pay

## 2022-09-13 ENCOUNTER — Telehealth: Payer: Self-pay | Admitting: *Deleted

## 2022-09-13 NOTE — Telephone Encounter (Signed)
Sister called and stated that patient is coughing up green sputum and is having difficulty breathing.  Started approx 2 days ago, however has progressed.  Denies fever, however is very weak.  Last chemo treatment 08/30/22.

## 2022-09-14 ENCOUNTER — Encounter: Payer: Self-pay | Admitting: *Deleted

## 2022-09-14 ENCOUNTER — Inpatient Hospital Stay: Payer: Medicare HMO

## 2022-09-14 ENCOUNTER — Other Ambulatory Visit (HOSPITAL_COMMUNITY): Payer: Self-pay | Admitting: Family Medicine

## 2022-09-14 ENCOUNTER — Telehealth: Payer: Self-pay | Admitting: *Deleted

## 2022-09-14 ENCOUNTER — Ambulatory Visit (HOSPITAL_COMMUNITY)
Admission: RE | Admit: 2022-09-14 | Discharge: 2022-09-14 | Disposition: A | Discharge status: Home / Self Care | Payer: Medicare HMO | Source: Ambulatory Visit | Attending: Family Medicine | Admitting: Family Medicine

## 2022-09-14 DIAGNOSIS — E876 Hypokalemia: Secondary | ICD-10-CM | POA: Diagnosis not present

## 2022-09-14 DIAGNOSIS — R059 Cough, unspecified: Secondary | ICD-10-CM

## 2022-09-14 DIAGNOSIS — C3490 Malignant neoplasm of unspecified part of unspecified bronchus or lung: Secondary | ICD-10-CM

## 2022-09-14 DIAGNOSIS — E041 Nontoxic single thyroid nodule: Secondary | ICD-10-CM | POA: Diagnosis not present

## 2022-09-14 NOTE — Progress Notes (Signed)
Called sister Regina Mcmahon to check on health status since she did not go to ER, as suggested the last 2 days.  States she is on her way to see PCP this morning.  I made her aware that she will need to be tested for COVID and flu prior to making plan for future treatment.  She will call me with results and suggestions from PCP and I will request copies of records.

## 2022-09-14 NOTE — Telephone Encounter (Signed)
Called patient's sister Regina Mcmahon to follow up on symptoms.  She continues to have productive cough, generalized malaise and SOB.   She went to PCP today and tested negative for Flu and Covid.  Has been sent for a chest x ray and was prescribed Levaquin.  Will make Dr. Delton Coombes aware once CXR is completed.  Treatment was cancelled today due to symptoms.

## 2022-09-21 ENCOUNTER — Other Ambulatory Visit: Payer: Self-pay

## 2022-09-22 ENCOUNTER — Encounter: Payer: Self-pay | Admitting: Radiology

## 2022-09-22 ENCOUNTER — Inpatient Hospital Stay: Payer: Medicare HMO

## 2022-09-22 VITALS — BP 108/72 | HR 82 | Temp 97.7°F | Resp 20

## 2022-09-22 VITALS — BP 112/78 | HR 78 | Temp 97.3°F | Resp 20 | Wt 188.8 lb

## 2022-09-22 DIAGNOSIS — F32A Depression, unspecified: Secondary | ICD-10-CM | POA: Diagnosis not present

## 2022-09-22 DIAGNOSIS — Z95828 Presence of other vascular implants and grafts: Secondary | ICD-10-CM

## 2022-09-22 DIAGNOSIS — C50412 Malignant neoplasm of upper-outer quadrant of left female breast: Secondary | ICD-10-CM | POA: Diagnosis not present

## 2022-09-22 DIAGNOSIS — C3490 Malignant neoplasm of unspecified part of unspecified bronchus or lung: Secondary | ICD-10-CM

## 2022-09-22 DIAGNOSIS — J432 Centrilobular emphysema: Secondary | ICD-10-CM | POA: Diagnosis not present

## 2022-09-22 DIAGNOSIS — I3139 Other pericardial effusion (noninflammatory): Secondary | ICD-10-CM | POA: Diagnosis not present

## 2022-09-22 DIAGNOSIS — R197 Diarrhea, unspecified: Secondary | ICD-10-CM | POA: Diagnosis not present

## 2022-09-22 DIAGNOSIS — Z5112 Encounter for antineoplastic immunotherapy: Secondary | ICD-10-CM | POA: Diagnosis not present

## 2022-09-22 DIAGNOSIS — E039 Hypothyroidism, unspecified: Secondary | ICD-10-CM | POA: Diagnosis not present

## 2022-09-22 DIAGNOSIS — K5900 Constipation, unspecified: Secondary | ICD-10-CM | POA: Diagnosis not present

## 2022-09-22 DIAGNOSIS — R0602 Shortness of breath: Secondary | ICD-10-CM | POA: Diagnosis not present

## 2022-09-22 DIAGNOSIS — C3432 Malignant neoplasm of lower lobe, left bronchus or lung: Secondary | ICD-10-CM | POA: Diagnosis not present

## 2022-09-22 DIAGNOSIS — D696 Thrombocytopenia, unspecified: Secondary | ICD-10-CM | POA: Diagnosis not present

## 2022-09-22 DIAGNOSIS — I700 Atherosclerosis of aorta: Secondary | ICD-10-CM | POA: Diagnosis not present

## 2022-09-22 DIAGNOSIS — R070 Pain in throat: Secondary | ICD-10-CM | POA: Diagnosis not present

## 2022-09-22 DIAGNOSIS — J90 Pleural effusion, not elsewhere classified: Secondary | ICD-10-CM | POA: Diagnosis not present

## 2022-09-22 DIAGNOSIS — M8580 Other specified disorders of bone density and structure, unspecified site: Secondary | ICD-10-CM | POA: Diagnosis not present

## 2022-09-22 DIAGNOSIS — R059 Cough, unspecified: Secondary | ICD-10-CM | POA: Diagnosis not present

## 2022-09-22 DIAGNOSIS — E669 Obesity, unspecified: Secondary | ICD-10-CM | POA: Diagnosis not present

## 2022-09-22 DIAGNOSIS — I2510 Atherosclerotic heart disease of native coronary artery without angina pectoris: Secondary | ICD-10-CM | POA: Diagnosis not present

## 2022-09-22 DIAGNOSIS — E042 Nontoxic multinodular goiter: Secondary | ICD-10-CM | POA: Diagnosis not present

## 2022-09-22 DIAGNOSIS — R519 Headache, unspecified: Secondary | ICD-10-CM | POA: Diagnosis not present

## 2022-09-22 DIAGNOSIS — E876 Hypokalemia: Secondary | ICD-10-CM | POA: Diagnosis not present

## 2022-09-22 DIAGNOSIS — M4854XA Collapsed vertebra, not elsewhere classified, thoracic region, initial encounter for fracture: Secondary | ICD-10-CM | POA: Diagnosis not present

## 2022-09-22 DIAGNOSIS — Z170 Estrogen receptor positive status [ER+]: Secondary | ICD-10-CM | POA: Diagnosis not present

## 2022-09-22 DIAGNOSIS — C321 Malignant neoplasm of supraglottis: Secondary | ICD-10-CM | POA: Diagnosis not present

## 2022-09-22 LAB — CBC WITH DIFFERENTIAL/PLATELET
Abs Immature Granulocytes: 0.02 10*3/uL (ref 0.00–0.07)
Basophils Absolute: 0 10*3/uL (ref 0.0–0.1)
Basophils Relative: 1 %
Eosinophils Absolute: 0.2 10*3/uL (ref 0.0–0.5)
Eosinophils Relative: 4 %
HCT: 42 % (ref 36.0–46.0)
Hemoglobin: 14.3 g/dL (ref 12.0–15.0)
Immature Granulocytes: 0 %
Lymphocytes Relative: 13 %
Lymphs Abs: 0.8 10*3/uL (ref 0.7–4.0)
MCH: 30.4 pg (ref 26.0–34.0)
MCHC: 34 g/dL (ref 30.0–36.0)
MCV: 89.2 fL (ref 80.0–100.0)
Monocytes Absolute: 0.4 10*3/uL (ref 0.1–1.0)
Monocytes Relative: 6 %
Neutro Abs: 4.5 10*3/uL (ref 1.7–7.7)
Neutrophils Relative %: 76 %
Platelets: 206 10*3/uL (ref 150–400)
RBC: 4.71 MIL/uL (ref 3.87–5.11)
RDW: 12.3 % (ref 11.5–15.5)
WBC: 6 10*3/uL (ref 4.0–10.5)
nRBC: 0 % (ref 0.0–0.2)

## 2022-09-22 LAB — COMPREHENSIVE METABOLIC PANEL
ALT: 8 U/L (ref 0–44)
AST: 15 U/L (ref 15–41)
Albumin: 3.6 g/dL (ref 3.5–5.0)
Alkaline Phosphatase: 68 U/L (ref 38–126)
Anion gap: 8 (ref 5–15)
BUN: 18 mg/dL (ref 8–23)
CO2: 26 mmol/L (ref 22–32)
Calcium: 8.9 mg/dL (ref 8.9–10.3)
Chloride: 102 mmol/L (ref 98–111)
Creatinine, Ser: 0.9 mg/dL (ref 0.44–1.00)
GFR, Estimated: >60 mL/min (ref >60)
Glucose, Bld: 105 mg/dL — ABNORMAL HIGH (ref 70–99)
Potassium: 3.2 mmol/L — ABNORMAL LOW (ref 3.5–5.1)
Sodium: 136 mmol/L (ref 135–145)
Total Bilirubin: 1.2 mg/dL (ref 0.3–1.2)
Total Protein: 6.8 g/dL (ref 6.5–8.1)

## 2022-09-22 LAB — TSH: TSH: 15.675 u[IU]/mL — ABNORMAL HIGH (ref 0.350–4.500)

## 2022-09-22 LAB — MAGNESIUM: Magnesium: 2.2 mg/dL (ref 1.7–2.4)

## 2022-09-22 MED ORDER — SODIUM CHLORIDE 0.9% FLUSH
10.0000 mL | Freq: Once | INTRAVENOUS | Status: AC
  Administered 2022-09-22: 10 mL via INTRAVENOUS

## 2022-09-22 MED ORDER — POTASSIUM CHLORIDE CRYS ER 20 MEQ PO TBCR
40.0000 meq | EXTENDED_RELEASE_TABLET | Freq: Once | ORAL | Status: AC
  Administered 2022-09-22: 40 meq via ORAL
  Filled 2022-09-22: qty 2

## 2022-09-22 MED ORDER — HEPARIN SOD (PORK) LOCK FLUSH 100 UNIT/ML IV SOLN
500.0000 [IU] | Freq: Once | INTRAVENOUS | Status: AC | PRN
  Administered 2022-09-22: 500 [IU]

## 2022-09-22 MED ORDER — SODIUM CHLORIDE 0.9% FLUSH
10.0000 mL | INTRAVENOUS | Status: DC | PRN
  Administered 2022-09-22: 10 mL

## 2022-09-22 MED ORDER — SODIUM CHLORIDE 0.9 % IV SOLN: Freq: Once | INTRAVENOUS | Status: AC

## 2022-09-22 MED ORDER — SODIUM CHLORIDE 0.9 % IV SOLN
10.0000 mg/kg | Freq: Once | INTRAVENOUS | Status: AC
  Administered 2022-09-22: 860 mg via INTRAVENOUS
  Filled 2022-09-22: qty 10

## 2022-09-22 NOTE — Patient Instructions (Signed)
[facility]  Discharge Instructions: Thank you for choosing [facility] to provide your oncology and hematology care.  If you have a lab appointment with the [facility], please come in thru the Main Entrance and check in at the main information desk.  Wear comfortable clothing and clothing appropriate for easy access to any Portacath or PICC line.   We strive to give you quality time with your provider. You may need to reschedule your appointment if you arrive late (15 or more minutes).  Arriving late affects you and other patients whose appointments are after yours.  Also, if you miss three or more appointments without notifying the office, you may be dismissed from the clinic at the provider's discretion.      For prescription refill requests, have your pharmacy contact our office and allow 72 hours for refills to be completed.    Today you received the following chemotherapy and/or immunotherapy agents Imfinzi      To help prevent nausea and vomiting after your treatment, we encourage you to take your nausea medication as directed.  BELOW ARE SYMPTOMS THAT SHOULD BE REPORTED IMMEDIATELY: *FEVER GREATER THAN 100.4 F (38 C) OR HIGHER *CHILLS OR SWEATING *NAUSEA AND VOMITING THAT IS NOT CONTROLLED WITH YOUR NAUSEA MEDICATION *UNUSUAL SHORTNESS OF BREATH *UNUSUAL BRUISING OR BLEEDING *URINARY PROBLEMS (pain or burning when urinating, or frequent urination) *BOWEL PROBLEMS (unusual diarrhea, constipation, pain near the anus) TENDERNESS IN MOUTH AND THROAT WITH OR WITHOUT PRESENCE OF ULCERS (sore throat, sores in mouth, or a toothache) UNUSUAL RASH, SWELLING OR PAIN  UNUSUAL VAGINAL DISCHARGE OR ITCHING   Items with * indicate a potential emergency and should be followed up as soon as possible or go to the Emergency Department if any problems should occur.  Please show the CHEMOTHERAPY ALERT CARD or IMMUNOTHERAPY ALERT CARD at check-in to the Emergency  Department and triage nurse.  Should you have questions after your visit or need to cancel or reschedule your appointment, please contact [facility] [phone]  and follow the prompts.  Office hours are 8:00 a.m. to 4:30 p.m. Monday - Friday. Please note that voicemails left after 4:00 p.m. may not be returned until the following business day.  We are closed weekends and major holidays. You have access to a nurse at all times for urgent questions. Please call the main number to the clinic [phone] and follow the prompts.  For any non-urgent questions, you may also contact your provider using MyChart. We now offer e-Visits for anyone 87 and older to request care online for non-urgent symptoms. For details visit [email or web address].   Also download the MyChart app! Go to the app store, search "MyChart", open the app, select [facility], and log in with your MyChart username and password.

## 2022-09-22 NOTE — Telephone Encounter (Signed)
Error

## 2022-09-22 NOTE — Progress Notes (Signed)
Patient presents today for Imfinzi infusion per providers order.  Vital signs within parameters for treatment.  Labs pending.  Patient had bronchitis recently and was treated with and completed a course of Levaquin.  Labs reviewed and within parameters for treatment.  Treatment given today per MD orders.  Stable during infusion without adverse affects.  Vital signs stable.  No complaints at this time.  Discharge from clinic ambulatory in stable condition.  Alert and oriented X 3.  Follow up with [facility] as scheduled.

## 2022-09-29 ENCOUNTER — Ambulatory Visit: Payer: Medicare HMO

## 2022-09-29 ENCOUNTER — Ambulatory Visit: Payer: Medicare HMO | Admitting: Hematology

## 2022-09-29 ENCOUNTER — Other Ambulatory Visit: Payer: Medicare HMO

## 2022-09-30 ENCOUNTER — Other Ambulatory Visit: Payer: Self-pay

## 2022-09-30 DIAGNOSIS — C3490 Malignant neoplasm of unspecified part of unspecified bronchus or lung: Secondary | ICD-10-CM

## 2022-09-30 MED ORDER — POTASSIUM CHLORIDE 20 MEQ PO PACK: 20.0000 meq | PACK | Freq: Two times a day (BID) | ORAL | 3 refills | Status: DC

## 2022-09-30 NOTE — Telephone Encounter (Signed)
Potassium refilled per last office visit note with Dr. Delton Coombes

## 2022-10-04 ENCOUNTER — Other Ambulatory Visit: Payer: Self-pay | Admitting: *Deleted

## 2022-10-04 DIAGNOSIS — C3490 Malignant neoplasm of unspecified part of unspecified bronchus or lung: Secondary | ICD-10-CM

## 2022-10-04 MED ORDER — POTASSIUM CHLORIDE 20 MEQ PO PACK: 20.0000 meq | PACK | Freq: Two times a day (BID) | ORAL | 6 refills | Status: DC

## 2022-10-05 ENCOUNTER — Inpatient Hospital Stay: Payer: Medicare HMO | Attending: Hematology and Oncology

## 2022-10-05 ENCOUNTER — Inpatient Hospital Stay (HOSPITAL_BASED_OUTPATIENT_CLINIC_OR_DEPARTMENT_OTHER): Payer: Medicare HMO | Admitting: Hematology

## 2022-10-05 ENCOUNTER — Inpatient Hospital Stay: Payer: Medicare HMO

## 2022-10-05 VITALS — BP 137/74 | HR 88 | Temp 97.3°F | Resp 20

## 2022-10-05 DIAGNOSIS — R070 Pain in throat: Secondary | ICD-10-CM | POA: Insufficient documentation

## 2022-10-05 DIAGNOSIS — Z79620 Long term (current) use of immunosuppressive biologic: Secondary | ICD-10-CM | POA: Insufficient documentation

## 2022-10-05 DIAGNOSIS — K5900 Constipation, unspecified: Secondary | ICD-10-CM | POA: Diagnosis not present

## 2022-10-05 DIAGNOSIS — Z923 Personal history of irradiation: Secondary | ICD-10-CM | POA: Diagnosis not present

## 2022-10-05 DIAGNOSIS — Z8719 Personal history of other diseases of the digestive system: Secondary | ICD-10-CM | POA: Insufficient documentation

## 2022-10-05 DIAGNOSIS — Z8041 Family history of malignant neoplasm of ovary: Secondary | ICD-10-CM | POA: Diagnosis not present

## 2022-10-05 DIAGNOSIS — E039 Hypothyroidism, unspecified: Secondary | ICD-10-CM | POA: Insufficient documentation

## 2022-10-05 DIAGNOSIS — Z853 Personal history of malignant neoplasm of breast: Secondary | ICD-10-CM | POA: Insufficient documentation

## 2022-10-05 DIAGNOSIS — E876 Hypokalemia: Secondary | ICD-10-CM | POA: Insufficient documentation

## 2022-10-05 DIAGNOSIS — Z801 Family history of malignant neoplasm of trachea, bronchus and lung: Secondary | ICD-10-CM | POA: Diagnosis not present

## 2022-10-05 DIAGNOSIS — C3432 Malignant neoplasm of lower lobe, left bronchus or lung: Secondary | ICD-10-CM | POA: Diagnosis present

## 2022-10-05 DIAGNOSIS — Z808 Family history of malignant neoplasm of other organs or systems: Secondary | ICD-10-CM | POA: Diagnosis not present

## 2022-10-05 DIAGNOSIS — Z87891 Personal history of nicotine dependence: Secondary | ICD-10-CM | POA: Insufficient documentation

## 2022-10-05 DIAGNOSIS — C3490 Malignant neoplasm of unspecified part of unspecified bronchus or lung: Secondary | ICD-10-CM

## 2022-10-05 DIAGNOSIS — J90 Pleural effusion, not elsewhere classified: Secondary | ICD-10-CM | POA: Diagnosis not present

## 2022-10-05 DIAGNOSIS — C321 Malignant neoplasm of supraglottis: Secondary | ICD-10-CM | POA: Insufficient documentation

## 2022-10-05 DIAGNOSIS — Z800 Family history of malignant neoplasm of digestive organs: Secondary | ICD-10-CM | POA: Insufficient documentation

## 2022-10-05 DIAGNOSIS — Z79899 Other long term (current) drug therapy: Secondary | ICD-10-CM | POA: Diagnosis not present

## 2022-10-05 DIAGNOSIS — Z79811 Long term (current) use of aromatase inhibitors: Secondary | ICD-10-CM | POA: Diagnosis not present

## 2022-10-05 DIAGNOSIS — T451X5A Adverse effect of antineoplastic and immunosuppressive drugs, initial encounter: Secondary | ICD-10-CM | POA: Insufficient documentation

## 2022-10-05 DIAGNOSIS — R072 Precordial pain: Secondary | ICD-10-CM | POA: Diagnosis not present

## 2022-10-05 DIAGNOSIS — Z5112 Encounter for antineoplastic immunotherapy: Secondary | ICD-10-CM | POA: Insufficient documentation

## 2022-10-05 DIAGNOSIS — Z9049 Acquired absence of other specified parts of digestive tract: Secondary | ICD-10-CM | POA: Insufficient documentation

## 2022-10-05 DIAGNOSIS — Z79890 Hormone replacement therapy (postmenopausal): Secondary | ICD-10-CM | POA: Diagnosis not present

## 2022-10-05 DIAGNOSIS — D6959 Other secondary thrombocytopenia: Secondary | ICD-10-CM | POA: Insufficient documentation

## 2022-10-05 LAB — CBC WITH DIFFERENTIAL/PLATELET
Abs Immature Granulocytes: 0.01 10*3/uL (ref 0.00–0.07)
Basophils Absolute: 0 10*3/uL (ref 0.0–0.1)
Basophils Relative: 0 %
Eosinophils Absolute: 0.2 10*3/uL (ref 0.0–0.5)
Eosinophils Relative: 4 %
HCT: 41.3 % (ref 36.0–46.0)
Hemoglobin: 14 g/dL (ref 12.0–15.0)
Immature Granulocytes: 0 %
Lymphocytes Relative: 13 %
Lymphs Abs: 0.7 10*3/uL (ref 0.7–4.0)
MCH: 30 pg (ref 26.0–34.0)
MCHC: 33.9 g/dL (ref 30.0–36.0)
MCV: 88.6 fL (ref 80.0–100.0)
Monocytes Absolute: 0.4 10*3/uL (ref 0.1–1.0)
Monocytes Relative: 8 %
Neutro Abs: 3.9 10*3/uL (ref 1.7–7.7)
Neutrophils Relative %: 75 %
Platelets: 182 10*3/uL (ref 150–400)
RBC: 4.66 MIL/uL (ref 3.87–5.11)
RDW: 12.5 % (ref 11.5–15.5)
WBC: 5.3 10*3/uL (ref 4.0–10.5)
nRBC: 0 % (ref 0.0–0.2)

## 2022-10-05 LAB — COMPREHENSIVE METABOLIC PANEL
ALT: 9 U/L (ref 0–44)
AST: 15 U/L (ref 15–41)
Albumin: 3.6 g/dL (ref 3.5–5.0)
Alkaline Phosphatase: 65 U/L (ref 38–126)
Anion gap: 9 (ref 5–15)
BUN: 17 mg/dL (ref 8–23)
CO2: 26 mmol/L (ref 22–32)
Calcium: 9.5 mg/dL (ref 8.9–10.3)
Chloride: 103 mmol/L (ref 98–111)
Creatinine, Ser: 0.89 mg/dL (ref 0.44–1.00)
GFR, Estimated: >60 mL/min (ref >60)
Glucose, Bld: 110 mg/dL — ABNORMAL HIGH (ref 70–99)
Potassium: 3.4 mmol/L — ABNORMAL LOW (ref 3.5–5.1)
Sodium: 138 mmol/L (ref 135–145)
Total Bilirubin: 1.5 mg/dL — ABNORMAL HIGH (ref 0.3–1.2)
Total Protein: 6.9 g/dL (ref 6.5–8.1)

## 2022-10-05 LAB — TSH: TSH: 15.176 u[IU]/mL — ABNORMAL HIGH (ref 0.350–4.500)

## 2022-10-05 LAB — MAGNESIUM: Magnesium: 2.1 mg/dL (ref 1.7–2.4)

## 2022-10-05 MED ORDER — HEPARIN SOD (PORK) LOCK FLUSH 100 UNIT/ML IV SOLN
500.0000 [IU] | Freq: Once | INTRAVENOUS | Status: AC | PRN
  Administered 2022-10-05: 500 [IU]

## 2022-10-05 MED ORDER — SODIUM CHLORIDE 0.9 % IV SOLN: Freq: Once | INTRAVENOUS | Status: AC

## 2022-10-05 MED ORDER — SODIUM CHLORIDE 0.9 % IV SOLN
10.0000 mg/kg | Freq: Once | INTRAVENOUS | Status: AC
  Administered 2022-10-05: 860 mg via INTRAVENOUS
  Filled 2022-10-05: qty 10

## 2022-10-05 MED ORDER — SODIUM CHLORIDE 0.9% FLUSH
10.0000 mL | INTRAVENOUS | Status: DC | PRN
  Administered 2022-10-05: 10 mL

## 2022-10-05 MED ORDER — POTASSIUM CHLORIDE CRYS ER 20 MEQ PO TBCR
40.0000 meq | EXTENDED_RELEASE_TABLET | Freq: Once | ORAL | Status: AC
  Administered 2022-10-05: 40 meq via ORAL
  Filled 2022-10-05: qty 2

## 2022-10-05 NOTE — Patient Instructions (Signed)
[facility]  Discharge Instructions: Thank you for choosing [facility] to provide your oncology and hematology care.  If you have a lab appointment with the Cancer Center, please come in thru the Main Entrance and check in at the main information desk.  Wear comfortable clothing and clothing appropriate for easy access to any Portacath or PICC line.   We strive to give you quality time with your provider. You may need to reschedule your appointment if you arrive late (15 or more minutes).  Arriving late affects you and other patients whose appointments are after yours.  Also, if you miss three or more appointments without notifying the office, you may be dismissed from the clinic at the provider's discretion.      For prescription refill requests, have your pharmacy contact our office and allow 72 hours for refills to be completed.    Today you received the following chemotherapy and/or immunotherapy agents Imfinzi.  Durvalumab Injection What is this medication? DURVALUMAB (dur VAL ue mab) treats some types of cancer. It works by helping your immune system slow or stop the spread of cancer cells. It is a monoclonal antibody. This medicine may be used for other purposes; ask your health care provider or pharmacist if you have questions. COMMON BRAND NAME(S): IMFINZI What should I tell my care team before I take this medication? They need to know if you have any of these conditions: Allogeneic stem cell transplant (uses someone else's stem cells) Autoimmune diseases, such as Crohn disease, ulcerative colitis, lupus History of chest radiation Nervous system problems, such as Guillain-Barre syndrome, myasthenia gravis Organ transplant An unusual or allergic reaction to durvalumab, other medications, foods, dyes, or preservatives Pregnant or trying to get pregnant Breast-feeding How should I use this medication? This medication is infused into a vein. It is  given by your care team in a hospital or clinic setting. A special MedGuide will be given to you before each treatment. Be sure to read this information carefully each time. Talk to your care team about the use of this medication in children. Special care may be needed. Overdosage: If you think you have taken too much of this medicine contact a poison control center or emergency room at once. NOTE: This medicine is only for you. Do not share this medicine with others. What if I miss a dose? Keep appointments for follow-up doses. It is important not to miss your dose. Call your care team if you are unable to keep an appointment. What may interact with this medication? Interactions have not been studied. This list may not describe all possible interactions. Give your health care provider a list of all the medicines, herbs, non-prescription drugs, or dietary supplements you use. Also tell them if you smoke, drink alcohol, or use illegal drugs. Some items may interact with your medicine. What should I watch for while using this medication? Your condition will be monitored carefully while you are receiving this medication. You may need blood work while taking this medication. This medication may cause serious skin reactions. They can happen weeks to months after starting the medication. Contact your care team right away if you notice fevers or flu-like symptoms with a rash. The rash may be red or purple and then turn into blisters or peeling of the skin. You may also notice a red rash with swelling of the face, lips, or lymph nodes in your neck or under your arms. Tell your care team right away   if you have any change in your eyesight. Talk to your care team if you may be pregnant. Serious birth defects can occur if you take this medication during pregnancy and for 3 months after the last dose. You will need a negative pregnancy test before starting this medication. Contraception is recommended while taking  this medication and for 3 months after the last dose. Your care team can help you find the option that works for you. Do not breastfeed while taking this medication and for 3 months after the last dose. What side effects may I notice from receiving this medication? Side effects that you should report to your care team as soon as possible: Allergic reactions--skin rash, itching, hives, swelling of the face, lips, tongue, or throat Dry cough, shortness of breath or trouble breathing Eye pain, redness, irritation, or discharge with blurry or decreased vision Heart muscle inflammation--unusual weakness or fatigue, shortness of breath, chest pain, fast or irregular heartbeat, dizziness, swelling of the ankles, feet, or hands Hormone gland problems--headache, sensitivity to light, unusual weakness or fatigue, dizziness, fast or irregular heartbeat, increased sensitivity to cold or heat, excessive sweating, constipation, hair loss, increased thirst or amount of urine, tremors or shaking, irritability Infusion reactions--chest pain, shortness of breath or trouble breathing, feeling faint or lightheaded Kidney injury (glomerulonephritis)--decrease in the amount of urine, red or dark Jaydin Jalomo urine, foamy or bubbly urine, swelling of the ankles, hands, or feet Liver injury--right upper belly pain, loss of appetite, nausea, light-colored stool, dark yellow or Regina Mcmahon urine, yellowing skin or eyes, unusual weakness or fatigue Pain, tingling, or numbness in the hands or feet, muscle weakness, change in vision, confusion or trouble speaking, loss of balance or coordination, trouble walking, seizures Rash, fever, and swollen lymph nodes Redness, blistering, peeling, or loosening of the skin, including inside the mouth Sudden or severe stomach pain, bloody diarrhea, fever, nausea, vomiting Side effects that usually do not require medical attention (report these to your care team if they continue or are  bothersome): Bone, joint, or muscle pain Diarrhea Fatigue Loss of appetite Nausea Skin rash This list may not describe all possible side effects. Call your doctor for medical advice about side effects. You may report side effects to FDA at 1-800-FDA-1088. Where should I keep my medication? This medication is given in a hospital or clinic. It will not be stored at home. NOTE: This sheet is a summary. It may not cover all possible information. If you have questions about this medicine, talk to your doctor, pharmacist, or health care provider.  2023 Elsevier/Gold Standard (2021-11-12 00:00:00)        To help prevent nausea and vomiting after your treatment, we encourage you to take your nausea medication as directed.  BELOW ARE SYMPTOMS THAT SHOULD BE REPORTED IMMEDIATELY: *FEVER GREATER THAN 100.4 F (38 C) OR HIGHER *CHILLS OR SWEATING *NAUSEA AND VOMITING THAT IS NOT CONTROLLED WITH YOUR NAUSEA MEDICATION *UNUSUAL SHORTNESS OF BREATH *UNUSUAL BRUISING OR BLEEDING *URINARY PROBLEMS (pain or burning when urinating, or frequent urination) *BOWEL PROBLEMS (unusual diarrhea, constipation, pain near the anus) TENDERNESS IN MOUTH AND THROAT WITH OR WITHOUT PRESENCE OF ULCERS (sore throat, sores in mouth, or a toothache) UNUSUAL RASH, SWELLING OR PAIN  UNUSUAL VAGINAL DISCHARGE OR ITCHING   Items with * indicate a potential emergency and should be followed up as soon as possible or go to the Emergency Department if any problems should occur.  Please show the CHEMOTHERAPY ALERT CARD or IMMUNOTHERAPY ALERT CARD at check-in to   the Emergency Department and triage nurse.  Should you have questions after your visit or need to cancel or reschedule your appointment, please contact [facility] [phone]  and follow the prompts.  Office hours are 8:00 a.m. to 4:30 p.m. Monday - Friday. Please note that voicemails left after 4:00 p.m. may not be returned until the following  business day.  We are closed weekends and major holidays. You have access to a nurse at all times for urgent questions. Please call the main number to the clinic [phone] and follow the prompts.  For any non-urgent questions, you may also contact your provider using MyChart. We now offer e-Visits for anyone 18 and older to request care online for non-urgent symptoms. For details visit [email or web address].   Also download the MyChart app! Go to the app store, search "MyChart", open the app, select [facility], and log in with your MyChart username and password.   

## 2022-10-05 NOTE — Progress Notes (Signed)
Regina Mcmahon [address], [place] [address]    Clinic Day:  10/05/2022  Referring physician: Derek Jack, MD  Patient Care Team: [name], MD as PCP - General (Internal Medicine) Herminio Commons, MD (Inactive) as PCP - Cardiology (Cardiology) Erroll Luna, MD as Consulting Physician (General Surgery) Thea Silversmith, MD as Consulting Physician (Radiation Oncology) Sylvan Cheese, NP as Nurse Practitioner (Hematology and Oncology) Danie Binder, MD (Inactive) as Consulting Physician (Gastroenterology) Derek Jack, MD as Medical Oncologist (Medical Oncology) Malmfelt, Stephani Police, RN as Oncology Nurse Navigator Melida Quitter, MD as Consulting Physician (Otolaryngology) Eppie Gibson, MD as Consulting Physician (Radiation Oncology) Benay Pike, MD as Consulting Physician (Hematology and Oncology) Brien Mates, RN as Oncology Nurse Navigator (Medical Oncology)   ASSESSMENT & PLAN:   Assessment: Stage III squamous cell cancer: - LLL lung mass FNA (08/23/2021): Malignant cells consistent with squamous cell carcinoma. - Lymph node 4R and 7: Malignant cells consistent with NSCLC. - Chemoradiation therapy with weekly carboplatin and paclitaxel from 11/01/2021, week for chemotherapy on 11/23/2021.  XRT completed on 12/16/2021. - She missed further doses of chemotherapy secondary to thrombocytopenia. - PET scan (01/27/2022): Interval response to therapy with decreasing size of mediastinal lymph nodes.  No new disease.    Social/family history: - Seen today with her Sister Neoma Laming.  Patient lives by herself.  Quit smoking on 02/22/2021.  Smoked 2 packs/day for 40 years. - Mother died of ovarian cancer.  Brother died of colon cancer at age 59.  Sister had melanoma.  Paternal uncles had stomach cancer and lung cancer.  Paternal aunt had thyroid cancer.  Another paternal aunt had pancreatic cancer.   3.  Supraglottic squamous  cell carcinoma (T1N1): - Definitive radiation from 08/16/2021 through 09/13/2021  Plan: Stage III squamous cell lung cancer: - CT chest on 08/03/2022: Similar to minimal decrease in size of borderline precarinal lymph node.  Similar left greater than right lower lobe radiation changes.  New right middle lobe groundglass and airspace disease favoring superimposed pneumonia.  Stable small pleural effusion. - She was treated with Augmentin.  Sputum improved to clear at this time. - Reviewed labs today which showed normal LFTs and slightly elevated total bilirubin.  CBC is normal. - She will proceed with her next cycle of durvalumab today and in 2 weeks.  RTC 4 weeks for follow-up. - Will repeat CT CAP with contrast prior to next visit. - Will also consider brain imaging in the next few months.  Also consider NGS testing.    2.  Supraglottic squamous cell carcinoma (T1N1): - CT soft tissue neck on 08/03/2022 with no evidence of recurrence. - Continue follow-up with Dr. Redmond Baseman. - Will refer to speech therapy.  3.  Throat pain and sternal chest pain: - Continue hydrocodone liquid every 6 hours.  4.  Hypokalemia: - Continue potassium 20 mEq daily.  Potassium today is 3.4.  5.  Constipation: - Continue stool softener and MiraLAX.   6.  Hypothyroidism: - Last TSH is elevated at 15.6.  Today TSH is pending.  Continue Synthroid 50 mcg daily.  Orders Placed This Encounter  Procedures   CT CHEST ABDOMEN PELVIS W CONTRAST    Standing Status:   Future    Standing Expiration Date:   10/05/2023    Order Specific Question:   If indicated for the ordered procedure, I authorize the administration of contrast media per Radiology protocol    Answer:   Yes  Order Specific Question:   Does the patient have a contrast media/X-ray dye allergy?    Answer:   No    Order Specific Question:   Preferred imaging location?    Answer:   [facility]    Order Specific Question:   Is Oral Contrast requested  for this exam?    Answer:   Yes, Per Radiology protocol   Magnesium    Standing Status:   Future    Standing Expiration Date:   12/01/2023   CBC with Differential    Standing Status:   Future    Standing Expiration Date:   12/01/2023   Comprehensive metabolic panel    Standing Status:   Future    Standing Expiration Date:   12/01/2023   T4    Standing Status:   Future    Standing Expiration Date:   12/01/2023   TSH    Standing Status:   Future    Standing Expiration Date:   12/01/2023      I,Alexis Herring,acting as a scribe for Derek Jack, MD.,have documented all relevant documentation on the behalf of Derek Jack, MD,as directed by  Derek Jack, MD while in the presence of Derek Jack, MD.   I, Derek Jack MD, have reviewed the above documentation for accuracy and completeness, and I agree with the above.   Derek Jack, MD   3/13/20247:12 PM  CHIEF COMPLAINT:   Diagnosis: stage III squamous cell lung cancer    Cancer Staging  Carcinoma of upper-outer quadrant of left female breast [facility]) Staging form: Breast, AJCC 7th Edition - Clinical stage from 07/01/2014: Stage IIA (T2, N0, M0) - Unsigned - Pathologic stage from 08/20/2014: Stage IIB (T2, N1a, cM0) - Unsigned  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[place]) Staging form: Larynx - Supraglottis, AJCC 8th Edition - Clinical stage from 07/30/2021: cT1, cN1 - Unsigned  Squamous cell lung cancer ([place]) Staging form: Lung, AJCC 8th Edition - Clinical stage from 09/08/2021: Stage IIIB (cT1b, cN3, cM0) - Signed by Eppie Gibson, MD on 09/08/2021    Prior Therapy: none  Current Therapy:  Durvalumab q14d    HISTORY OF PRESENT ILLNESS:   Oncology History  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    06/24/2014 Breast US   Left breast: ill-defined predominantly hypoechoic area with some increased echogenicity in the 2 o'clock position of the left breast, 7 cm from the nipple. 2.6 x 2.3 x 1.8 cm in maximum dimensions.   07/01/2014 Initial Biopsy   Left breast core needle bx: Invasive mammary carcinoma with lobular features, ER+ (100%), PR+ (91%), HER2/neu negative (ratio 1.09), Ki-67 32%. E-cadherin strongly positive, diagnostic for IDC    07/01/2014 Clinical Stage   Stage IIA: T2 N0   08/20/2014 Definitive Surgery   Left breast seed localized lumpectomy/SLNB: IDC, +LVI, DCIS, 1 LN removed and positive for metastatic carcinoma. Grade 2. HER2/neu repeated and negative (ratio 0.69-1.9).    08/20/2014 Pathologic Stage   Stage IIB: pT2 pN1a pMx   09/17/2014 Surgery   Port-a cath placement and re-excision   10/02/2014 - 01/15/2015 Chemotherapy   CMF x 6 cycles.  patient refused Taxane-containing chemotherapy.   03/07/2015 Procedure   Comp Cancer Panel reveals no variant at APC, ATM, AXIN2,  BARD1, BMPR1A, BRCA1, BRCA2, BRIP1, CDH1, CDK4, CDKN2A, CHEK2, FANCC, MLH1, MSH2, MSH6, MUTYH, NBN, PALB2, PMS2, POLD1, POLE, PTEN, RAD51C, RAD51D, SMAD4, STK11, TP53, VHL, and XRCC2.    04/06/2015 - 05/21/2015 Radiation Therapy   Adjuvant RT Pablo Ledger): Left breast/ 45 Gy over 25 fractions.   Left supraclavicular fossa and axilla/ 45 Gy over 25 fractions. Left breast boost/ 16 Gy over 8 fractions. Total dose: 61 Gy   06/04/2015 - 08/07/2015 Anti-estrogen oral therapy   Exemestane 25 mg daily.  Planned duration of therapy 5 years.    07/23/2015 Survivorship   Survivorship care plan completed and mailed to patient in lieu of in person visit   08/06/2015 Adverse Reaction   Severe hot flashes and mood swings   08/08/2015 - 12/07/2015 Anti-estrogen oral therapy   Arimidex   12/04/2015 Adverse Reaction   Worsening depression, patient stopped Arimidex    12/07/2015 -  Anti-estrogen oral therapy   Tamoxifen   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[place])  07/30/2021 Initial Diagnosis   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[place]) P 16 positive cT1N1   08/16/2021 - 09/13/2021 Radiation Therapy   Completed definitive radiation   09/30/2021 Pathology Results   [facility] Pathology PDL 1 29%   Squamous cell lung cancer ([place])  07/23/2021 Imaging    PET/CT  demonstrated small right glottic lesion is mildly hypermetabolic and consistent with no neoplasm.  PET also demonstrated borderline right level 2 lymph node left supraclavicular, mediastinal and hilar lymphadenopathy and hypermetabolic left lower lobe pulmonary nodule all suspicious of metastatic focus.  No abdominal pelvic metastatic disease or osseous metastatic disease was appreciated.   08/31/2021 Initial Diagnosis   Squamous cell carcinoma of lung ([place])   09/08/2021 Cancer Staging   Staging form: Lung, AJCC 8th Edition - Clinical stage from 09/08/2021: Stage IIIB (cT1b, cN3, cM0) - Signed by Eppie Gibson, MD on 09/08/2021 Stage prefix: Initial diagnosis   09/30/2021 Pathology Results   [facility] 360 Pathology PDL 1 29%   11/01/2021 - 11/23/2021 Chemotherapy   Patient is on Treatment Plan : LUNG Carboplatin / Paclitaxel + XRT q7d     02/01/2022 - 03/15/2022 Chemotherapy   Patient is on Treatment Plan : LUNG Durvalumab q14d     02/01/2022 -  Chemotherapy   Patient is on Treatment Plan : LUNG Durvalumab (10) q14d        INTERVAL HISTORY:   Regina Mcmahon is a 72 y.o. female presenting to clinic today for follow up of stage III squamous cell lung cancer. She was last seen by me on 08/30/22.  Today, she states that she is doing well overall. Her appetite level is at 60%. Her energy level is at 25%. She denies any fevers or night sweats.   PAST MEDICAL HISTORY:   Past Medical History: Past Medical History:  Diagnosis Date   Arthritis    Asthma    Asymptomatic varicose veins    Breast cancer ([place])  06/2014   left   Cancer ([place])    Supra Glottis   Chronic airway obstruction ([facility])    Complication of anesthesia 2016   difficulty waking up, Oxygen dropped low.   COPD (chronic obstructive pulmonary disease) ([facility])    Depression    Dyspnea    GERD (gastroesophageal reflux disease)    Hemorrhage rectum    and anus.   Hypertension    Insomnia    Other symptoms involving digestive system(787.99)    Pain    Hx.pain in joint involving pelvic region and thigh; pain  in limb   Palpitations    Panic attacks    Personal history of chemotherapy 2016   Personal history of radiation therapy 2016   Pneumonia    Pre-diabetes    Prediabetes    Sinusitis    Symptomatic menopausal or female climacteric states    Varicose veins of both lower extremities with pain     Surgical History: Past Surgical History:  Procedure Laterality Date   BREAST BIOPSY Left 10/2012   BREAST BIOPSY Left 07/01/2014   malignant   BREAST LUMPECTOMY Left 08/20/2014   BRONCHIAL BIOPSY  08/23/2021   Procedure: BRONCHIAL BIOPSIES;  Surgeon: Collene Gobble, MD;  Location: [facility] ENDOSCOPY;  Service: Pulmonary;;   BRONCHIAL BRUSHINGS  08/23/2021   Procedure: BRONCHIAL BRUSHINGS;  Surgeon: Collene Gobble, MD;  Location: [facility] ENDOSCOPY;  Service: Pulmonary;;   BRONCHIAL NEEDLE ASPIRATION BIOPSY  08/23/2021   Procedure: BRONCHIAL NEEDLE ASPIRATION BIOPSIES;  Surgeon: Collene Gobble, MD;  Location[facility];  Service: Pulmonary;;   BRONCHIAL WASHINGS  08/23/2021   Procedure: BRONCHIAL WASHINGS;  Surgeon: Collene Gobble, MD;  Location: [facility] ENDOSCOPY;  Service: Pulmonary;;   CESAREAN SECTION     CHOLECYSTECTOMY  1985   COLONOSCOPY  2002   Dr. Lindalou Hose: internal hemorrhoids, one small rectal polyp, adenomatous path    COLONOSCOPY N/A 11/23/2015   Procedure: COLONOSCOPY;  Surgeon: Danie Binder, MD;  Location: [facility] ENDO [facility];  Service: Endoscopy;  Laterality: N/A;  1100 - moved to 10:45 - office to notify    ESOPHAGOGASTRODUODENOSCOPY N/A 11/23/2015   Procedure: ESOPHAGOGASTRODUODENOSCOPY (EGD);  Surgeon: Danie Binder, MD;  Location: [facility];  Service: Endoscopy;  Laterality: N/A;   IR IMAGING GUIDED PORT INSERTION  11/08/2021   PORT-A-CATH REMOVAL  02/2015   PORTACATH PLACEMENT Right 09/17/2014   Procedure: INSERTION PORT-A-CATH WITH ULTRASOUND;  Surgeon: Erroll Luna, MD;  Location: [place];  Service: General;  Laterality: Right;  IJ   RADIOACTIVE SEED GUIDED PARTIAL MASTECTOMY WITH AXILLARY SENTINEL LYMPH NODE BIOPSY Left 08/20/2014   Procedure: LEFT BREAST LUMPECTOMY WITH RADIOACTIVE SEED LOCALIZATION AND SENTINEL LYMPH NODE MAPPING;  Surgeon: Erroll Luna, MD;  Location: [place];  Service: General;  Laterality: Left;   RE-EXCISION OF BREAST LUMPECTOMY Left 09/17/2014   Procedure: RE-EXCISION OF BREAST LUMPECTOMY;  Surgeon: Erroll Luna, MD;  Location: [place];  Service: General;  Laterality: Left;   TOTAL HIP ARTHROPLASTY Right 02/22/2021   Procedure: RIGHT TOTAL HIP ARTHROPLASTY ANTERIOR APPROACH;  Surgeon: Marybelle Killings, MD;  Location: [place];  Service: Orthopedics;  Laterality: Right;   TUBAL LIGATION     VIDEO BRONCHOSCOPY WITH ENDOBRONCHIAL ULTRASOUND N/A 08/23/2021   Procedure: VIDEO BRONCHOSCOPY WITH ENDOBRONCHIAL ULTRASOUND;  Surgeon: Collene Gobble, MD;  Location: [facility] ENDOSCOPY;  Service: Pulmonary;  Laterality: N/A;   VIDEO BRONCHOSCOPY WITH RADIAL ENDOBRONCHIAL ULTRASOUND  08/23/2021   Procedure: VIDEO BRONCHOSCOPY WITH RADIAL ENDOBRONCHIAL ULTRASOUND;  Surgeon: Collene Gobble, MD;  Location: [facility] ENDOSCOPY;  Service: Pulmonary;;    Social History: Social History   Socioeconomic History   Marital status: Divorced    Spouse name: Not on file   Number of children: 2   Years of education: Not on file   Highest education level: Not on file  Occupational History   Not on file  Tobacco Use   Smoking status: Former     Packs/day: 1.00    Years: 42.00    Total pack years: 42.00  Types: Cigarettes    Quit date: 02/22/2021    Years since quitting: 1.6   Smokeless tobacco: Never  Vaping Use   Vaping Use: Never used  Substance and Sexual Activity   Alcohol use: No    Alcohol/week: 0.0 standard drinks of alcohol   Drug use: No   Sexual activity: Never    Birth control/protection: None  Other Topics Concern   Not on file  Social History Narrative   Not on file   Social Determinants of Health   Financial Resource Strain: Not on file  Food Insecurity: Not on file  Transportation Needs: Not on file  Physical Activity: Not on file  Stress: Not on file  Social Connections: Unknown (08/05/2021)   Social Connection and Isolation Panel [NHANES]    Frequency of Communication with Friends and Family: More than three times a week    Frequency of Social Gatherings with Friends and Family: More than three times a week    Attends Religious Services: Not on Advertising copywriter or Organizations: Not on file    Attends Archivist Meetings: Not on file    Marital Status: Not on file  Intimate Partner Violence: Not on file    Family History: Family History  Problem Relation Age of Onset   Diabetes Mother    Ovarian cancer Mother 28   Other Father        died in MVA at age 73   Other Sister        mitral valve disorder   Melanoma Sister 3       removed from back of leg   Colon cancer Brother        early 20s, succumbed to disease   Cancer Paternal Aunt        leukemia, bone, or lung cancer   Alzheimer's disease Maternal Grandmother    Heart attack Maternal Grandfather    Heart attack Paternal Grandmother    COPD Paternal Grandfather    Emphysema Paternal Grandfather    Congestive Heart Failure Paternal Aunt    Stomach cancer Paternal Uncle    Kidney cancer Maternal Uncle    Cancer Cousin        dx. teens/dx. 55s   Cancer Cousin    Colon cancer Cousin     Current  Medications:  Current Outpatient Medications:    albuterol (VENTOLIN HFA) 108 (90 Base) MCG/ACT inhaler, Inhale 2 puffs into the lungs every 4 (four) hours as needed., Disp: , Rfl:    ALPRAZolam (XANAX) 0.5 MG tablet, Take 0.5 mg by mouth 3 (three) times daily., Disp: , Rfl:    ANORO ELLIPTA 62.5-25 MCG/INH AEPB, Inhale 1 puff into the lungs daily., Disp: , Rfl:    calcium carbonate (TUMS - DOSED IN MG ELEMENTAL CALCIUM) 500 MG chewable tablet, Chew 2 tablets by mouth 3 (three) times daily as needed for indigestion or heartburn., Disp: , Rfl:    fluconazole (DIFLUCAN) 100 MG tablet, Take 2 tablets today, then 1 tablet daily x 20 more days., Disp: 22 tablet, Rfl: 0   furosemide (LASIX) 20 MG tablet, Take 20 mg by mouth daily., Disp: , Rfl:    hydrochlorothiazide (HYDRODIURIL) 12.5 MG tablet, Take 12.5 mg by mouth daily., Disp: , Rfl:    HYDROcodone-acetaminophen (HYCET) 7.5-325 mg/15 ml solution, Take 15 mLs by mouth every 6 (six) hours as needed for moderate pain., Disp: 1800 mL, Rfl: 0   ibuprofen (ADVIL) 100 MG/5ML suspension, Take 10  mLs (200 mg total) by mouth every 4 (four) hours as needed for moderate pain., Disp: 473 mL, Rfl: 0   levothyroxine (SYNTHROID) 50 MCG tablet, Take 1 tablet by mouth daily before breakfast., Disp: , Rfl:    lidocaine (XYLOCAINE) 2 % solution, Patient: Mix 1part 2% viscous lidocaine, 1part H20. Swallow 33m of diluted mixture, 35m before meals and at bedtime, up to QID for sore throat., Disp: 200 mL, Rfl: 3   lidocaine-prilocaine (EMLA) cream, Apply 1 application. topically as needed., Disp: 30 g, Rfl: 0   Menthol, Topical Analgesic, (BIOFREEZE EX), Apply 1 application topically daily as needed (pain)., Disp: , Rfl:    nystatin (MYCOSTATIN) 100000 UNIT/ML suspension, Take 5 mLs (500,000 Units total) by mouth 4 (four) times daily., Disp: 60 mL, Rfl: 0   ondansetron (ZOFRAN) 8 MG tablet, Take 1 tablet (8 mg total) by mouth every 8 (eight) hours as needed for nausea or  vomiting., Disp: 60 tablet, Rfl: 0   pantoprazole (PROTONIX) 40 MG tablet, Take 1 tablet by mouth once daily, Disp: 90 tablet, Rfl: 0   polyethylene glycol (MIRALAX / GLYCOLAX) 17 g packet, Take 17 g by mouth daily., Disp: , Rfl:    potassium chloride (KLOR-CON) 20 MEQ packet, Take 20 mEq by mouth 2 (two) times daily. Dissolve in at least 5 ounces cold water or other beverage and take after meals, Disp: 60 packet, Rfl: 6   prochlorperazine (COMPAZINE) 10 MG tablet, Take 1 tablet (10 mg total) by mouth every 6 (six) hours as needed for nausea or vomiting., Disp: 60 tablet, Rfl: 0   sertraline (ZOLOFT) 100 MG tablet, Take 100 mg by mouth daily., Disp: , Rfl:    sucralfate (CARAFATE) 1 g tablet, Dissolve 1 tablet in 10 mL H20 and swallow 30 min prior to meals and bedtime., Disp: 40 tablet, Rfl: 3   Vitamin D, Ergocalciferol, 50000 units CAPS, Take 1 capsule by mouth once a week., Disp: , Rfl:    Allergies: No Known Allergies  REVIEW OF SYSTEMS:   Review of Systems  Constitutional:  Negative for chills, fatigue and fever.  HENT:   Positive for sore throat. Negative for lump/mass, mouth sores, nosebleeds and trouble swallowing.   Eyes:  Negative for eye problems.  Respiratory:  Positive for cough and shortness of breath.   Cardiovascular:  Negative for chest pain, leg swelling and palpitations.  Gastrointestinal:  Negative for abdominal pain, constipation, diarrhea, nausea and vomiting.  Genitourinary:  Negative for bladder incontinence, difficulty urinating, dysuria, frequency, hematuria and nocturia.   Musculoskeletal:  Negative for arthralgias, back pain, flank pain, myalgias and neck pain.  Skin:  Negative for itching and rash.  Neurological:  Positive for dizziness and headaches. Negative for numbness.  Hematological:  Does not bruise/bleed easily.  Psychiatric/Behavioral:  Positive for depression. Negative for sleep disturbance and suicidal ideas. The patient is nervous/anxious.   All  other systems reviewed and are negative.    VITALS:   There were no vitals taken for this visit.  Wt Readings from Last 3 Encounters:  10/05/22 192 lb (87.1 kg)  09/22/22 188 lb 12.8 oz (85.6 kg)  08/30/22 185 lb 6.4 oz (84.1 kg)    There is no height or weight on file to calculate BMI.  Performance status (ECOG): 1 - Symptomatic but completely ambulatory  PHYSICAL EXAM:   Physical Exam Vitals and nursing note reviewed. Exam conducted with a chaperone present.  Constitutional:      Appearance: Normal appearance.  Cardiovascular:  Rate and Rhythm: Normal rate and regular rhythm.     Pulses: Normal pulses.     Heart sounds: Normal heart sounds.  Pulmonary:     Effort: Pulmonary effort is normal.     Breath sounds: Normal breath sounds.  Abdominal:     Palpations: Abdomen is soft. There is no hepatomegaly, splenomegaly or mass.     Tenderness: There is no abdominal tenderness.  Musculoskeletal:     Right lower leg: No edema.     Left lower leg: No edema.  Lymphadenopathy:     Cervical: No cervical adenopathy.     Right cervical: No superficial, deep or posterior cervical adenopathy.    Left cervical: No superficial, deep or posterior cervical adenopathy.     Upper Body:     Right upper body: No supraclavicular or axillary adenopathy.     Left upper body: No supraclavicular or axillary adenopathy.  Neurological:     General: No focal deficit present.     Mental Status: She is alert and oriented to person, place, and time.  Psychiatric:        Mood and Affect: Mood normal.        Behavior: Behavior normal.     LABS:      Latest Ref Rng & Units 10/05/2022    9:07 AM 09/22/2022    8:31 AM 08/30/2022    9:22 AM  CBC  WBC 4.0 - 10.5 K/uL 5.3  6.0  5.8   Hemoglobin 12.0 - 15.0 g/dL 14.0  14.3  15.1   Hematocrit 36.0 - 46.0 % 41.3  42.0  44.2   Platelets 150 - 400 K/uL 182  206  211       Latest Ref Rng & Units 10/05/2022    9:07 AM 09/22/2022    8:31 AM 08/30/2022     9:22 AM  CMP  Glucose 70 - 99 mg/dL 110  105  108   BUN 8 - 23 mg/dL '17  18  24   '$ Creatinine 0.44 - 1.00 mg/dL 0.89  0.90  1.01   Sodium 135 - 145 mmol/L 138  136  135   Potassium 3.5 - 5.1 mmol/L 3.4  3.2  3.3   Chloride 98 - 111 mmol/L 103  102  100   CO2 22 - 32 mmol/L '26  26  25   '$ Calcium 8.9 - 10.3 mg/dL 9.5  8.9  9.4   Total Protein 6.5 - 8.1 g/dL 6.9  6.8  7.5   Total Bilirubin 0.3 - 1.2 mg/dL 1.5  1.2  1.6   Alkaline Phos 38 - 126 U/L 65  68  80   AST 15 - 41 U/L '15  15  15   '$ ALT 0 - 44 U/L '9  8  9      '$ Lab Results  Component Value Date   CEA1 4.53 11/01/2021   /  CEA ([facility])  Date Value Ref Range Status  11/01/2021 4.53 0.00 - 5.00 ng/mL Final    Comment:    (NOTE) This test was performed using Architect's Chemiluminescent Microparticle Immunoassay. Values obtained from different assay methods cannot be used interchangeably. Please note that 5-10% of patients who smoke may see CEA levels up to 6.9 ng/mL. Performed at [facility], [place] [address], [place], [place] [address]    No results found for: "PSA1" No results found for: "[id number]" Lab Results  Component Value Date   CAN125 18.9 08/10/2021  No results found for: "TOTALPROTELP", "ALBUMINELP", "A1GS", "A2GS", "BETS", "BETA2SER", "GAMS", "MSPIKE", "SPEI" No results found for: "TIBC", "FERRITIN", "IRONPCTSAT" No results found for: "LDH"   STUDIES:   DG Chest 2 View  Result Date: 09/14/2022 CLINICAL DATA:  Cough EXAM: CHEST - 2 VIEW COMPARISON:  CT 08/03/2022 FINDINGS: Chest port catheter tip overlies the mid SVC. Unchanged cardiomediastinal silhouette. There are perihilar opacities bilaterally, left greater than right, consistent with post radiation changes seen on recent chest CT. No definite new airspace disease. No pleural effusion or pneumothorax. No acute osseous abnormality. IMPRESSION: Perihilar opacities bilaterally, left greater than right, compatible with  postradiation changes seen on recent chest CT. No definite new airspace disease. Electronically Signed   By: Maurine Simmering M.D.   On: 09/14/2022 11:39

## 2022-10-05 NOTE — Patient Instructions (Addendum)
Regina Mcmahon at [facility] Discharge Instructions   You were seen and examined today by Dr. Delton Coombes.  He reviewed part of the results of your blood work. Your CBC (complete blood count) is completely normal. Your liver and kidney numbers, your potassium and magnesium, and your TSH from today are pending.   We will proceed with your treatment today.   We will make a referral for you to a speech pathologist.   Return as scheduled.    Thank you for choosing [facility] to provide your oncology and hematology care.  To afford each patient quality time with our provider, please arrive at least 15 minutes before your scheduled appointment time.   If you have a lab appointment with the [facility] please come in thru the Main Entrance and check in at the main information desk.  You need to re-schedule your appointment should you arrive 10 or more minutes late.  We strive to give you quality time with our providers, and arriving late affects you and other patients whose appointments are after yours.  Also, if you no show three or more times for appointments you may be dismissed from the clinic at the providers discretion.     Again, thank you for choosing [facility].  Our hope is that these requests will decrease the amount of time that you wait before being seen by our physicians.       _____________________________________________________________  Should you have questions after your visit to [facility], please contact our office at [phone] and follow the prompts.  Our office hours are 8:00 a.m. and 4:30 p.m. Monday - Friday.  Please note that voicemails left after 4:00 p.m. may not be returned until the following business day.  We are closed weekends and major holidays.  You do have access to a nurse 24-7, just call the main number to the clinic [phone] and do not press any options, hold on the  line and a nurse will answer the phone.    For prescription refill requests, have your pharmacy contact our office and allow 72 hours.    Due to Covid, you will need to wear a mask upon entering the hospital. If you do not have a mask, a mask will be given to you at the Main Entrance upon arrival. For doctor visits, patients may have 1 support person age 24 or older with them. For treatment visits, patients can not have anyone with them due to social distancing guidelines and our immunocompromised population.

## 2022-10-05 NOTE — Progress Notes (Signed)
Patient presents today for Imfinzi infusion.  Patient is in satisfactory condition with no new complaints voiced.  Vital signs are stable.  Labs reviewed.  All labs are within treatment parameters.  Potassium today is 3.4.  We will give Klor Con 40 mEq PO x one dose today per standing orders by Dr. Delton Coombes.  We will proceed with treatment per MD orders.   Patient tolerated treatment well with no complaints voiced.  Patient left ambulatory in stable condition.  Vital signs stable at discharge.  Follow up as scheduled.

## 2022-10-11 ENCOUNTER — Other Ambulatory Visit: Payer: Medicare HMO

## 2022-10-11 ENCOUNTER — Ambulatory Visit: Payer: Medicare HMO

## 2022-10-13 ENCOUNTER — Other Ambulatory Visit: Payer: Medicare HMO

## 2022-10-13 ENCOUNTER — Ambulatory Visit: Payer: Medicare HMO

## 2022-10-19 ENCOUNTER — Ambulatory Visit: Payer: Medicare HMO | Admitting: Hematology

## 2022-10-19 ENCOUNTER — Other Ambulatory Visit: Payer: Medicare HMO

## 2022-10-19 ENCOUNTER — Inpatient Hospital Stay: Payer: Medicare HMO

## 2022-10-19 VITALS — BP 113/74 | HR 61 | Temp 97.5°F | Resp 18 | Wt 192.8 lb

## 2022-10-19 VITALS — BP 122/71 | HR 64 | Temp 98.0°F | Resp 18

## 2022-10-19 DIAGNOSIS — D6959 Other secondary thrombocytopenia: Secondary | ICD-10-CM | POA: Diagnosis not present

## 2022-10-19 DIAGNOSIS — Z79890 Hormone replacement therapy (postmenopausal): Secondary | ICD-10-CM | POA: Diagnosis not present

## 2022-10-19 DIAGNOSIS — Z800 Family history of malignant neoplasm of digestive organs: Secondary | ICD-10-CM | POA: Diagnosis not present

## 2022-10-19 DIAGNOSIS — R070 Pain in throat: Secondary | ICD-10-CM | POA: Diagnosis not present

## 2022-10-19 DIAGNOSIS — Z79811 Long term (current) use of aromatase inhibitors: Secondary | ICD-10-CM | POA: Diagnosis not present

## 2022-10-19 DIAGNOSIS — Z5112 Encounter for antineoplastic immunotherapy: Secondary | ICD-10-CM | POA: Diagnosis not present

## 2022-10-19 DIAGNOSIS — C3490 Malignant neoplasm of unspecified part of unspecified bronchus or lung: Secondary | ICD-10-CM

## 2022-10-19 DIAGNOSIS — C321 Malignant neoplasm of supraglottis: Secondary | ICD-10-CM | POA: Diagnosis not present

## 2022-10-19 DIAGNOSIS — T451X5A Adverse effect of antineoplastic and immunosuppressive drugs, initial encounter: Secondary | ICD-10-CM | POA: Diagnosis not present

## 2022-10-19 DIAGNOSIS — Z8041 Family history of malignant neoplasm of ovary: Secondary | ICD-10-CM | POA: Diagnosis not present

## 2022-10-19 DIAGNOSIS — Z8719 Personal history of other diseases of the digestive system: Secondary | ICD-10-CM | POA: Diagnosis not present

## 2022-10-19 DIAGNOSIS — E039 Hypothyroidism, unspecified: Secondary | ICD-10-CM | POA: Diagnosis not present

## 2022-10-19 DIAGNOSIS — Z801 Family history of malignant neoplasm of trachea, bronchus and lung: Secondary | ICD-10-CM | POA: Diagnosis not present

## 2022-10-19 DIAGNOSIS — K5900 Constipation, unspecified: Secondary | ICD-10-CM | POA: Diagnosis not present

## 2022-10-19 DIAGNOSIS — J90 Pleural effusion, not elsewhere classified: Secondary | ICD-10-CM | POA: Diagnosis not present

## 2022-10-19 DIAGNOSIS — Z79899 Other long term (current) drug therapy: Secondary | ICD-10-CM | POA: Diagnosis not present

## 2022-10-19 DIAGNOSIS — Z95828 Presence of other vascular implants and grafts: Secondary | ICD-10-CM

## 2022-10-19 DIAGNOSIS — Z808 Family history of malignant neoplasm of other organs or systems: Secondary | ICD-10-CM | POA: Diagnosis not present

## 2022-10-19 DIAGNOSIS — C3432 Malignant neoplasm of lower lobe, left bronchus or lung: Secondary | ICD-10-CM | POA: Diagnosis not present

## 2022-10-19 DIAGNOSIS — Z9049 Acquired absence of other specified parts of digestive tract: Secondary | ICD-10-CM | POA: Diagnosis not present

## 2022-10-19 DIAGNOSIS — E876 Hypokalemia: Secondary | ICD-10-CM | POA: Diagnosis not present

## 2022-10-19 DIAGNOSIS — Z87891 Personal history of nicotine dependence: Secondary | ICD-10-CM | POA: Diagnosis not present

## 2022-10-19 DIAGNOSIS — Z853 Personal history of malignant neoplasm of breast: Secondary | ICD-10-CM | POA: Diagnosis not present

## 2022-10-19 DIAGNOSIS — Z79620 Long term (current) use of immunosuppressive biologic: Secondary | ICD-10-CM | POA: Diagnosis not present

## 2022-10-19 DIAGNOSIS — R072 Precordial pain: Secondary | ICD-10-CM | POA: Diagnosis not present

## 2022-10-19 DIAGNOSIS — Z923 Personal history of irradiation: Secondary | ICD-10-CM | POA: Diagnosis not present

## 2022-10-19 LAB — COMPREHENSIVE METABOLIC PANEL
ALT: 10 U/L (ref 0–44)
AST: 11 U/L — ABNORMAL LOW (ref 15–41)
Albumin: 3.6 g/dL (ref 3.5–5.0)
Alkaline Phosphatase: 67 U/L (ref 38–126)
Anion gap: 5 (ref 5–15)
BUN: 15 mg/dL (ref 8–23)
CO2: 25 mmol/L (ref 22–32)
Calcium: 8.8 mg/dL — ABNORMAL LOW (ref 8.9–10.3)
Chloride: 106 mmol/L (ref 98–111)
Creatinine, Ser: 0.81 mg/dL (ref 0.44–1.00)
GFR, Estimated: >60 mL/min (ref >60)
Glucose, Bld: 95 mg/dL (ref 70–99)
Potassium: 4.2 mmol/L (ref 3.5–5.1)
Sodium: 136 mmol/L (ref 135–145)
Total Bilirubin: 1.4 mg/dL — ABNORMAL HIGH (ref 0.3–1.2)
Total Protein: 6.7 g/dL (ref 6.5–8.1)

## 2022-10-19 LAB — CBC WITH DIFFERENTIAL/PLATELET
Abs Immature Granulocytes: 0.02 10*3/uL (ref 0.00–0.07)
Basophils Absolute: 0 10*3/uL (ref 0.0–0.1)
Basophils Relative: 1 %
Eosinophils Absolute: 0.2 10*3/uL (ref 0.0–0.5)
Eosinophils Relative: 3 %
HCT: 41.6 % (ref 36.0–46.0)
Hemoglobin: 14.1 g/dL (ref 12.0–15.0)
Immature Granulocytes: 0 %
Lymphocytes Relative: 15 %
Lymphs Abs: 0.8 10*3/uL (ref 0.7–4.0)
MCH: 30.3 pg (ref 26.0–34.0)
MCHC: 33.9 g/dL (ref 30.0–36.0)
MCV: 89.5 fL (ref 80.0–100.0)
Monocytes Absolute: 0.3 10*3/uL (ref 0.1–1.0)
Monocytes Relative: 5 %
Neutro Abs: 3.9 10*3/uL (ref 1.7–7.7)
Neutrophils Relative %: 76 %
Platelets: 177 10*3/uL (ref 150–400)
RBC: 4.65 MIL/uL (ref 3.87–5.11)
RDW: 12.5 % (ref 11.5–15.5)
WBC: 5.2 10*3/uL (ref 4.0–10.5)
nRBC: 0 % (ref 0.0–0.2)

## 2022-10-19 LAB — TSH: TSH: 14.044 u[IU]/mL — ABNORMAL HIGH (ref 0.350–4.500)

## 2022-10-19 LAB — MAGNESIUM: Magnesium: 2.3 mg/dL (ref 1.7–2.4)

## 2022-10-19 MED ORDER — SODIUM CHLORIDE 0.9% FLUSH
10.0000 mL | INTRAVENOUS | Status: DC | PRN
  Administered 2022-10-19: 10 mL

## 2022-10-19 MED ORDER — SODIUM CHLORIDE 0.9 % IV SOLN
10.0000 mg/kg | Freq: Once | INTRAVENOUS | Status: AC
  Administered 2022-10-19: 860 mg via INTRAVENOUS
  Filled 2022-10-19: qty 10

## 2022-10-19 MED ORDER — SODIUM CHLORIDE 0.9 % IV SOLN: Freq: Once | INTRAVENOUS | Status: AC

## 2022-10-19 MED ORDER — SODIUM CHLORIDE 0.9% FLUSH
10.0000 mL | Freq: Once | INTRAVENOUS | Status: AC
  Administered 2022-10-19: 10 mL via INTRAVENOUS

## 2022-10-19 MED ORDER — HEPARIN SOD (PORK) LOCK FLUSH 100 UNIT/ML IV SOLN
500.0000 [IU] | Freq: Once | INTRAVENOUS | Status: AC | PRN
  Administered 2022-10-19: 500 [IU]

## 2022-10-19 NOTE — Progress Notes (Signed)
Patient presents today for Imfinzi . Vital signs within parameters for treatment today. Labs pending.   Treatment given today per MD orders. Tolerated infusion without adverse affects. Vital signs stable. No complaints at this time. Discharged from clinic ambulatory in stable condition. Alert and oriented x 3. F/U with [facility] as scheduled.

## 2022-10-19 NOTE — Progress Notes (Signed)
Lab work is back and parameters are met for treatment today.

## 2022-10-21 ENCOUNTER — Other Ambulatory Visit: Payer: Self-pay | Admitting: Hematology

## 2022-10-21 ENCOUNTER — Other Ambulatory Visit (HOSPITAL_COMMUNITY): Payer: Self-pay

## 2022-10-21 ENCOUNTER — Other Ambulatory Visit: Payer: Self-pay

## 2022-10-21 MED ORDER — HYDROCODONE-ACETAMINOPHEN 7.5-325 MG/15ML PO SOLN
15.0000 mL | Freq: Four times a day (QID) | ORAL | 0 refills | Status: DC | PRN
  Filled 2022-10-21: qty 1800, 30d supply, fill #0

## 2022-10-21 MED ORDER — HYDROCODONE-ACETAMINOPHEN 7.5-325 MG/15ML PO SOLN: 15.0000 mL | Freq: Four times a day (QID) | ORAL | 0 refills | Status: DC | PRN

## 2022-10-21 NOTE — Telephone Encounter (Signed)
I changed it to [employer].

## 2022-10-21 NOTE — Telephone Encounter (Signed)
Dr. Raliegh Ip , refuse this one.  [facility] does not have it.

## 2022-10-24 ENCOUNTER — Other Ambulatory Visit: Payer: Self-pay | Admitting: *Deleted

## 2022-10-24 MED ORDER — HYDROCODONE-ACETAMINOPHEN 7.5-325 MG/15ML PO SOLN: 15.0000 mL | Freq: Four times a day (QID) | ORAL | 0 refills | Status: DC | PRN

## 2022-10-25 ENCOUNTER — Other Ambulatory Visit: Payer: Medicare HMO

## 2022-10-25 ENCOUNTER — Other Ambulatory Visit: Payer: Self-pay

## 2022-10-25 ENCOUNTER — Ambulatory Visit: Payer: Medicare HMO | Admitting: Hematology

## 2022-10-25 ENCOUNTER — Ambulatory Visit: Payer: Medicare HMO

## 2022-10-26 ENCOUNTER — Encounter (HOSPITAL_COMMUNITY): Payer: Self-pay | Admitting: Radiology

## 2022-10-26 ENCOUNTER — Ambulatory Visit (HOSPITAL_COMMUNITY)
Admission: RE | Admit: 2022-10-26 | Discharge: 2022-10-26 | Disposition: A | Discharge status: Home / Self Care | Payer: Medicare HMO | Source: Ambulatory Visit | Attending: Hematology | Admitting: Hematology

## 2022-10-26 DIAGNOSIS — J439 Emphysema, unspecified: Secondary | ICD-10-CM | POA: Diagnosis not present

## 2022-10-26 DIAGNOSIS — K4090 Unilateral inguinal hernia, without obstruction or gangrene, not specified as recurrent: Secondary | ICD-10-CM | POA: Diagnosis not present

## 2022-10-26 DIAGNOSIS — C3490 Malignant neoplasm of unspecified part of unspecified bronchus or lung: Secondary | ICD-10-CM

## 2022-10-26 MED ORDER — IOHEXOL 300 MG/ML  SOLN
100.0000 mL | Freq: Once | INTRAMUSCULAR | Status: AC | PRN
  Administered 2022-10-26: 100 mL via INTRAVENOUS

## 2022-10-26 MED ORDER — HEPARIN SOD (PORK) LOCK FLUSH 100 UNIT/ML IV SOLN
INTRAVENOUS | Status: AC
  Administered 2022-10-26: 500 [IU]
  Filled 2022-10-26: qty 5

## 2022-10-27 ENCOUNTER — Other Ambulatory Visit: Payer: Self-pay

## 2022-10-27 ENCOUNTER — Other Ambulatory Visit: Payer: Self-pay | Admitting: Hematology

## 2022-11-01 NOTE — Progress Notes (Signed)
[facility] [address] [place] [address]    Clinic Day:  11/01/2022  Referring physician: Benita Stabile, MD  Patient Care Team: Benita Stabile, MD as PCP - General (Internal Medicine) Laqueta Linden, MD (Inactive) as PCP - Cardiology (Cardiology) Harriette Bouillon, MD as Consulting Physician (General Surgery) Lurline Hare, MD as Consulting Physician (Radiation Oncology) Salomon Fick, NP as Nurse Practitioner (Hematology and Oncology) West Bali, MD (Inactive) as Consulting Physician (Gastroenterology) Doreatha Massed, MD as Medical Oncologist (Medical Oncology) Malmfelt, Lise Auer, RN as Oncology Nurse Navigator Christia Reading, MD as Consulting Physician (Otolaryngology) Lonie Peak, MD as Consulting Physician (Radiation Oncology) [name], MD as Consulting Physician (Hematology and Oncology) Therese Sarah, RN as Oncology Nurse Navigator (Medical Oncology)   ASSESSMENT & PLAN:   Assessment: 1. Stage III squamous cell cancer: - LLL lung mass FNA (08/23/2021): Malignant cells consistent with squamous cell carcinoma. - Lymph node 4R and 7: Malignant cells consistent with NSCLC. - Chemoradiation therapy with weekly carboplatin and paclitaxel from 11/01/2021, week for chemotherapy on 11/23/2021.  XRT completed on 12/16/2021. - She missed further doses of chemotherapy secondary to thrombocytopenia. - PET scan (01/27/2022): Interval response to therapy with decreasing size of mediastinal lymph nodes.  No new disease.   2. Social/family history: - Seen today with her Sister Regina Mcmahon.  Patient lives by herself.  Quit smoking on 02/22/2021.  Smoked 2 packs/day for 40 years. - Mother died of ovarian cancer.  Brother died of colon cancer at age 84.  Sister had melanoma.  Paternal uncles had stomach cancer and lung cancer.  Paternal aunt had thyroid cancer.  Another paternal aunt had pancreatic cancer.  3.  Supraglottic squamous cell  carcinoma (T1N1): - Definitive radiation from 08/16/2021 through 09/13/2021   Plan: Stage III squamous cell lung cancer: - CT chest on 08/03/2022: Similar to minimal decrease in size of borderline precarinal lymph node.  Similar left greater than right lower lobe radiation changes.  New right middle lobe groundglass and airspace disease favoring superimposed pneumonia.  Stable small pleural effusion. - She was treated with Augmentin.  Sputum improved to clear at this time. - Reviewed labs today which showed normal LFTs and slightly elevated total bilirubin.  CBC is normal. - She will proceed with her next cycle of durvalumab today and in 2 weeks.  RTC 4 weeks for follow-up. - Will repeat CT CAP with contrast prior to next visit. - Will also consider brain imaging in the next few months.  Also consider NGS testing.   2.  Supraglottic squamous cell carcinoma (T1N1): - CT soft tissue neck on 08/03/2022 with no evidence of recurrence. - Continue follow-up with Dr. Jenne Pane. - Will refer to speech therapy.  3.  Throat pain and sternal chest pain: - Continue hydrocodone liquid every 6 hours.  4.  Hypokalemia: - Continue potassium 20 mEq daily.  Potassium today is 3.4.  5.  Constipation: - Continue stool softener and MiraLAX.   6.  Hypothyroidism: - Last TSH is elevated at 15.6.  Today TSH is pending.  Continue Synthroid 50 mcg daily.  No orders of the defined types were placed in this encounter.     I,[name],acting as a Neurosurgeon for Doreatha Massed, MD.,have documented all relevant documentation on the behalf of Doreatha Massed, MD,as directed by  Doreatha Massed, MD while in the presence of Doreatha Massed, MD.   ***  [name]   4/9/20249:09 PM  CHIEF COMPLAINT:  Diagnosis: stage III squamous cell lung cancer   Cancer Staging  Carcinoma of upper-outer quadrant of left female breast Staging form: Breast, AJCC 7th Edition - Clinical stage from  07/01/2014: Stage IIA (T2, N0, M0) - Unsigned - Pathologic stage from 08/20/2014: Stage IIB (T2, N1a, cM0) - Unsigned  Malignant neoplasm of supraglottis Staging form: Larynx - Supraglottis, AJCC 8th Edition - Clinical stage from 07/30/2021: cT1, cN1 - Unsigned  Squamous cell lung cancer Staging form: Lung, AJCC 8th Edition - Clinical stage from 09/08/2021: Stage IIIB (cT1b, cN3, cM0) - Signed by Lonie Peak, MD on 09/08/2021    Prior Therapy: none  Current Therapy:  Durvalumab q14d    HISTORY OF PRESENT ILLNESS:   Oncology History  Carcinoma of upper-outer quadrant of left female breast  06/24/2014 Mammogram   Left breast: 6 x 3 x 2.5 cm area of ill-defined increased density in the UOQ,  corresponding to the mass felt by the patient, not in the same location of the previously biopsied benign calcifications.    06/24/2014 Breast US   Left breast: ill-defined predominantly hypoechoic area with some increased echogenicity in the 2 o'clock position of the left breast, 7 cm from the nipple. 2.6 x 2.3 x 1.8 cm in maximum dimensions.   07/01/2014 Initial Biopsy   Left breast core needle bx: Invasive mammary carcinoma with lobular features, ER+ (100%), PR+ (91%), HER2/neu negative (ratio 1.09), Ki-67 32%. E-cadherin strongly positive, diagnostic for IDC    07/01/2014 Clinical Stage   Stage IIA: T2 N0   08/20/2014 Definitive Surgery   Left breast seed localized lumpectomy/SLNB: IDC, +LVI, DCIS, 1 LN removed and positive for metastatic carcinoma. Grade 2. HER2/neu repeated and negative (ratio 0.69-1.9).    08/20/2014 Pathologic Stage   Stage IIB: pT2 pN1a pMx   09/17/2014 Surgery   Port-a cath placement and re-excision   10/02/2014 - 01/15/2015 Chemotherapy   CMF x 6 cycles.  patient refused Taxane-containing chemotherapy.   03/07/2015 Procedure   Comp Cancer Panel reveals no variant at APC, ATM, AXIN2, BARD1, BMPR1A, BRCA1, BRCA2, BRIP1, CDH1, CDK4, CDKN2A, CHEK2, FANCC, MLH1, MSH2, MSH6,  MUTYH, NBN, PALB2, PMS2, POLD1, POLE, PTEN, RAD51C, RAD51D, SMAD4, STK11, TP53, VHL, and XRCC2.    04/06/2015 - 05/21/2015 Radiation Therapy   Adjuvant RT Michell Heinrich): Left breast/ 45 Gy over 25 fractions.   Left supraclavicular fossa and axilla/ 45 Gy over 25 fractions. Left breast boost/ 16 Gy over 8 fractions. Total dose: 61 Gy   06/04/2015 - 08/07/2015 Anti-estrogen oral therapy   Exemestane 25 mg daily.  Planned duration of therapy 5 years.    07/23/2015 Survivorship   Survivorship care plan completed and mailed to patient in lieu of in person visit   08/06/2015 Adverse Reaction   Severe hot flashes and mood swings   08/08/2015 - 12/07/2015 Anti-estrogen oral therapy   Arimidex   12/04/2015 Adverse Reaction   Worsening depression, patient stopped Arimidex   12/07/2015 -  Anti-estrogen oral therapy   Tamoxifen   Malignant neoplasm of supraglottis  07/30/2021 Initial Diagnosis   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HCC) P 16 positive cT1N1   08/16/2021 - 09/13/2021 Radiation Therapy   Completed definitive radiation   09/30/2021 Pathology Results   [facility] Pathology PDL 1 29%   Squamous cell lung cancer  07/23/2021 Imaging    PET/CT  demonstrated small right glottic lesion is mildly hypermetabolic and consistent with no neoplasm.  PET also demonstrated borderline right level 2 lymph node left supraclavicular, mediastinal and hilar  lymphadenopathy and hypermetabolic left lower lobe pulmonary nodule all suspicious of metastatic focus.  No abdominal pelvic metastatic disease or osseous metastatic disease was appreciated.   08/31/2021 Initial Diagnosis   Squamous cell carcinoma of lung (HCC)   09/08/2021 Cancer Staging   Staging form: Lung, AJCC 8th Edition - Clinical stage from 09/08/2021: Stage IIIB (cT1b, cN3, cM0) - Signed by Lonie Peak, MD on 09/08/2021 Stage prefix: Initial diagnosis   09/30/2021 Pathology Results   [facility] Pathology PDL 1 29%   11/01/2021 - 11/23/2021  Chemotherapy   Patient is on Treatment Plan : LUNG Carboplatin / Paclitaxel + XRT q7d     02/01/2022 - 03/15/2022 Chemotherapy   Patient is on Treatment Plan : LUNG Durvalumab q14d     02/01/2022 -  Chemotherapy   Patient is on Treatment Plan : LUNG Durvalumab (10) q14d        INTERVAL HISTORY:   Regina Mcmahon is a 72 y.o. female presenting to clinic today for follow up of stage III squamous cell lung cancer. She was last seen by me on 10/05/22.  Since her last visit, she underwent restaging CT C/A/P on 10/26/22 showing: decrease in subpleural consolidation in medial right middle lobe, now somewhat nodular in appearance; stable small pericardial effusion; fluid in endometrial canal.  Today, she states that she is doing well overall. Her appetite level is at ***%. Her energy level is at ***%.  PAST MEDICAL HISTORY:   Past Medical History: Past Medical History:  Diagnosis Date   Arthritis    Asthma    Asymptomatic varicose veins    Breast cancer 06/2014   left   Cancer    Supra Glottis   Chronic airway obstruction    Complication of anesthesia 2016   difficulty waking up, Oxygen dropped low.   COPD (chronic obstructive pulmonary disease)    Depression    Dyspnea    GERD (gastroesophageal reflux disease)    Hemorrhage rectum    and anus.   Hypertension    Insomnia    Other symptoms involving digestive system(787.99)    Pain    Hx.pain in joint involving pelvic region and thigh; pain in limb   Palpitations    Panic attacks    Personal history of chemotherapy 2016   Personal history of radiation therapy 2016   Pneumonia    Pre-diabetes    Prediabetes    Sinusitis    Symptomatic menopausal or female climacteric states    Varicose veins of both lower extremities with pain     Surgical History: Past Surgical History:  Procedure Laterality Date   BREAST BIOPSY Left 10/2012   BREAST BIOPSY Left 07/01/2014   malignant   BREAST LUMPECTOMY Left 08/20/2014   BRONCHIAL BIOPSY   08/23/2021   Procedure: BRONCHIAL BIOPSIES;  Surgeon: Leslye Peer, MD;  Location: [facility];  Service: Pulmonary;;   BRONCHIAL BRUSHINGS  08/23/2021   Procedure: BRONCHIAL BRUSHINGS;  Surgeon: Leslye Peer, MD;  Location: [facility];  Service: Pulmonary;;   BRONCHIAL NEEDLE ASPIRATION BIOPSY  08/23/2021   Procedure: BRONCHIAL NEEDLE ASPIRATION BIOPSIES;  Surgeon: Leslye Peer, MD;  Location: [facility];  Service: Pulmonary;;   BRONCHIAL WASHINGS  08/23/2021   Procedure: BRONCHIAL WASHINGS;  Surgeon: Leslye Peer, MD;  Location: [facility];  Service: Pulmonary;;   CESAREAN SECTION     CHOLECYSTECTOMY  1985   COLONOSCOPY  2002   Dr. Cleotis Nipper: internal hemorrhoids, one small rectal polyp, adenomatous path    COLONOSCOPY N/A  11/23/2015   Procedure: COLONOSCOPY;  Surgeon: West Bali, MD;  Location: [facility];  Service: Endoscopy;  Laterality: N/A;  1100 - moved to 10:45 - office to notify   ESOPHAGOGASTRODUODENOSCOPY N/A 11/23/2015   Procedure: ESOPHAGOGASTRODUODENOSCOPY (EGD);  Surgeon: West Bali, MD;  Location: [facility];  Service: Endoscopy;  Laterality: N/A;   IR IMAGING GUIDED PORT INSERTION  11/08/2021   PORT-A-CATH REMOVAL  02/2015   PORTACATH PLACEMENT Right 09/17/2014   Procedure: INSERTION PORT-A-CATH WITH ULTRASOUND;  Surgeon: Harriette Bouillon, MD;  Location: [facility];  Service: General;  Laterality: Right;  IJ   RADIOACTIVE SEED GUIDED PARTIAL MASTECTOMY WITH AXILLARY SENTINEL LYMPH NODE BIOPSY Left 08/20/2014   Procedure: LEFT BREAST LUMPECTOMY WITH RADIOACTIVE SEED LOCALIZATION AND SENTINEL LYMPH NODE MAPPING;  Surgeon: Harriette Bouillon, MD;  Location: [facility];  Service: General;  Laterality: Left;   RE-EXCISION OF BREAST LUMPECTOMY Left 09/17/2014   Procedure: RE-EXCISION OF BREAST LUMPECTOMY;  Surgeon: Harriette Bouillon, MD;  Location: [facility];  Service: General;  Laterality: Left;   TOTAL HIP ARTHROPLASTY  Right 02/22/2021   Procedure: RIGHT TOTAL HIP ARTHROPLASTY ANTERIOR APPROACH;  Surgeon: Eldred Manges, MD;  Location: [facility];  Service: Orthopedics;  Laterality: Right;   TUBAL LIGATION     VIDEO BRONCHOSCOPY WITH ENDOBRONCHIAL ULTRASOUND N/A 08/23/2021   Procedure: VIDEO BRONCHOSCOPY WITH ENDOBRONCHIAL ULTRASOUND;  Surgeon: Leslye Peer, MD;  Location: [facility];  Service: Pulmonary;  Laterality: N/A;   VIDEO BRONCHOSCOPY WITH RADIAL ENDOBRONCHIAL ULTRASOUND  08/23/2021   Procedure: VIDEO BRONCHOSCOPY WITH RADIAL ENDOBRONCHIAL ULTRASOUND;  Surgeon: Leslye Peer, MD;  Location: [facility];  Service: Pulmonary;;    Social History: Social History   Socioeconomic History   Marital status: Divorced    Spouse name: Not on file   Number of children: 2   Years of education: Not on file   Highest education level: Not on file  Occupational History   Not on file  Tobacco Use   Smoking status: Former    Packs/day: 1.00    Years: 42.00    Additional pack years: 0.00    Total pack years: 42.00    Types: Cigarettes    Quit date: 02/22/2021    Years since quitting: 1.6   Smokeless tobacco: Never  Vaping Use   Vaping Use: Never used  Substance and Sexual Activity   Alcohol use: No    Alcohol/week: 0.0 standard drinks of alcohol   Drug use: No   Sexual activity: Never    Birth control/protection: None  Other Topics Concern   Not on file  Social History Narrative   Not on file   Social Determinants of Health   Financial Resource Strain: Not on file  Food Insecurity: Not on file  Transportation Needs: Not on file  Physical Activity: Not on file  Stress: Not on file  Social Connections: Unknown (08/05/2021)   Social Connection and Isolation Panel [NHANES]    Frequency of Communication with Friends and Family: More than three times a week    Frequency of Social Gatherings with Friends and Family: More than three times a week    Attends Religious Services: Not on Administrator, sports or Organizations: Not on file    Attends Banker Meetings: Not on file    Marital Status: Not on file  Intimate Partner Violence: Not on file    Family History: Family History  Problem  Relation Age of Onset   Diabetes Mother    Ovarian cancer Mother 22   Other Father        died in MVA at age 43   Other Sister        mitral valve disorder   Melanoma Sister 42       removed from back of leg   Colon cancer Brother        early 54s, succumbed to disease   Cancer Paternal Aunt        leukemia, bone, or lung cancer   Alzheimer's disease Maternal Grandmother    Heart attack Maternal Grandfather    Heart attack Paternal Grandmother    COPD Paternal Grandfather    Emphysema Paternal Grandfather    Congestive Heart Failure Paternal Aunt    Stomach cancer Paternal Uncle    Kidney cancer Maternal Uncle    Cancer Cousin        dx. teens/dx. 40s   Cancer Cousin    Colon cancer Cousin     Current Medications:  Current Outpatient Medications:    albuterol (VENTOLIN HFA) 108 (90 Base) MCG/ACT inhaler, Inhale 2 puffs into the lungs every 4 (four) hours as needed., Disp: , Rfl:    ALPRAZolam (XANAX) 0.5 MG tablet, Take 0.5 mg by mouth 3 (three) times daily., Disp: , Rfl:    ANORO ELLIPTA 62.5-25 MCG/INH AEPB, Inhale 1 puff into the lungs daily., Disp: , Rfl:    calcium carbonate (TUMS - DOSED IN MG ELEMENTAL CALCIUM) 500 MG chewable tablet, Chew 2 tablets by mouth 3 (three) times daily as needed for indigestion or heartburn., Disp: , Rfl:    fluconazole (DIFLUCAN) 100 MG tablet, Take 2 tablets today, then 1 tablet daily x 20 more days., Disp: 22 tablet, Rfl: 0   furosemide (LASIX) 20 MG tablet, Take 20 mg by mouth daily., Disp: , Rfl:    hydrochlorothiazide (HYDRODIURIL) 12.5 MG tablet, Take 12.5 mg by mouth daily., Disp: , Rfl:    HYDROcodone-acetaminophen (HYCET) 7.5-325 mg/15 ml solution, Take 15 mLs by mouth every 6 (six) hours as needed for moderate pain.,  Disp: , Rfl:    ibuprofen (ADVIL) 100 MG/5ML suspension, Take 10 mLs (200 mg total) by mouth every 4 (four) hours as needed for moderate pain., Disp: 473 mL, Rfl: 0   levothyroxine (SYNTHROID) 50 MCG tablet, TAKE 1 TABLET BY MOUTH DAILY BEFORE BREAKFAST, Disp: 90 tablet, Rfl: 0   lidocaine (XYLOCAINE) 2 % solution, Patient: Mix 1part 2% viscous lidocaine, 1part H20. Swallow 10mL of diluted mixture, before meals and at bedtime, up to QID for sore throat., Disp: 200 mL, Rfl: 3   lidocaine-prilocaine (EMLA) cream, Apply 1 application. topically as needed., Disp: 30 g, Rfl: 0   Menthol, Topical Analgesic, (BIOFREEZE EX), Apply 1 application topically daily as needed (pain)., Disp: , Rfl:    nystatin (MYCOSTATIN) 100000 UNIT/ML suspension, Take 5 mLs (500,000 Units total) by mouth 4 (four) times daily., Disp: 60 mL, Rfl: 0   ondansetron (ZOFRAN) 8 MG tablet, Take 1 tablet (8 mg total) by mouth every 8 (eight) hours as needed for nausea or vomiting., Disp: 60 tablet, Rfl: 0   pantoprazole (PROTONIX) 40 MG tablet, Take 1 tablet by mouth once daily, Disp: 90 tablet, Rfl: 0   polyethylene glycol (MIRALAX / GLYCOLAX) 17 g packet, Take 17 g by mouth daily., Disp: , Rfl:    potassium chloride (KLOR-CON) 20 MEQ packet, Take 20 mEq by mouth 2 (two) times daily.  Dissolve in at least 5 ounces cold water or other beverage and take after meals, Disp: 60 packet, Rfl: 6   prochlorperazine (COMPAZINE) 10 MG tablet, Take 1 tablet (10 mg total) by mouth every 6 (six) hours as needed for nausea or vomiting., Disp: 60 tablet, Rfl: 0   sertraline (ZOLOFT) 100 MG tablet, Take 100 mg by mouth daily., Disp: , Rfl:    sucralfate (CARAFATE) 1 g tablet, Dissolve 1 tablet in 10 mL H20 and swallow 30 min prior to meals and bedtime., Disp: 40 tablet, Rfl: 3   Vitamin D, Ergocalciferol, 50000 units CAPS, Take 1 capsule by mouth once a week., Disp: , Rfl:    Allergies: No Known Allergies  REVIEW OF SYSTEMS:   Review of  Systems  Constitutional:  Negative for chills, fatigue and fever.  HENT:   Negative for lump/mass, mouth sores, nosebleeds, sore throat and trouble swallowing.   Eyes:  Negative for eye problems.  Respiratory:  Negative for cough and shortness of breath.   Cardiovascular:  Negative for chest pain, leg swelling and palpitations.  Gastrointestinal:  Negative for abdominal pain, constipation, diarrhea, nausea and vomiting.  Genitourinary:  Negative for bladder incontinence, difficulty urinating, dysuria, frequency, hematuria and nocturia.   Musculoskeletal:  Negative for arthralgias, back pain, flank pain, myalgias and neck pain.  Skin:  Negative for itching and rash.  Neurological:  Negative for dizziness, headaches and numbness.  Hematological:  Does not bruise/bleed easily.  Psychiatric/Behavioral:  Negative for depression, sleep disturbance and suicidal ideas. The patient is not nervous/anxious.   All other systems reviewed and are negative.    VITALS:   There were no vitals taken for this visit.  Wt Readings from Last 3 Encounters:  10/19/22 192 lb 12.8 oz (87.5 kg)  10/05/22 192 lb (87.1 kg)  09/22/22 188 lb 12.8 oz (85.6 kg)    There is no height or weight on file to calculate BMI.  Performance status (ECOG): {CHL ONC [id number]  PHYSICAL EXAM:   Physical Exam Vitals and nursing note reviewed. Exam conducted with a chaperone present.  Constitutional:      Appearance: Normal appearance.  Cardiovascular:     Rate and Rhythm: Normal rate and regular rhythm.     Pulses: Normal pulses.     Heart sounds: Normal heart sounds.  Pulmonary:     Effort: Pulmonary effort is normal.     Breath sounds: Normal breath sounds.  Abdominal:     Palpations: Abdomen is soft. There is no hepatomegaly, splenomegaly or mass.     Tenderness: There is no abdominal tenderness.  Musculoskeletal:     Right lower leg: No edema.     Left lower leg: No edema.  Lymphadenopathy:     Cervical:  No cervical adenopathy.     Right cervical: No superficial, deep or posterior cervical adenopathy.    Left cervical: No superficial, deep or posterior cervical adenopathy.     Upper Body:     Right upper body: No supraclavicular or axillary adenopathy.     Left upper body: No supraclavicular or axillary adenopathy.  Neurological:     General: No focal deficit present.     Mental Status: She is alert and oriented to person, place, and time.  Psychiatric:        Mood and Affect: Mood normal.        Behavior: Behavior normal.     LABS:      Latest Ref Rng & Units 10/19/2022   11:58  AM 10/05/2022    9:07 AM 09/22/2022    8:31 AM  CBC  WBC 4.0 - 10.5 K/uL 5.2  5.3  6.0   Hemoglobin 12.0 - 15.0 g/dL 57.814.1  46.914.0  62.914.3   Hematocrit 36.0 - 46.0 % 41.6  41.3  42.0   Platelets 150 - 400 K/uL 177  182  206       Latest Ref Rng & Units 10/19/2022   11:58 AM 10/05/2022    9:07 AM 09/22/2022    8:31 AM  CMP  Glucose 70 - 99 mg/dL 95  528110  413105   BUN 8 - 23 mg/dL 15  17  18    Creatinine 0.44 - 1.00 mg/dL 2.440.81  0.100.89  2.720.90   Sodium 135 - 145 mmol/L 136  138  136   Potassium 3.5 - 5.1 mmol/L 4.2  3.4  3.2   Chloride 98 - 111 mmol/L 106  103  102   CO2 22 - 32 mmol/L 25  26  26    Calcium 8.9 - 10.3 mg/dL 8.8  9.5  8.9   Total Protein 6.5 - 8.1 g/dL 6.7  6.9  6.8   Total Bilirubin 0.3 - 1.2 mg/dL 1.4  1.5  1.2   Alkaline Phos 38 - 126 U/L 67  65  68   AST 15 - 41 U/L 11  15  15    ALT 0 - 44 U/L 10  9  8       Lab Results  Component Value Date   CEA1 4.53 11/01/2021   /  CEA ([facility])  Date Value Ref Range Status  11/01/2021 4.53 0.00 - 5.00 ng/mL Final    Comment:    (NOTE) This test was performed using [employer]'s Chemiluminescent Microparticle Immunoassay. Values obtained from different assay methods cannot be used interchangeably. Please note that 5-10% of patients who smoke may see CEA levels up to 6.9 ng/mL. Performed at [facility], [address] [place], [place] [address]    No results found for: "PSA1" No results found for: "CAN199" Lab Results  Component Value Date   CAN125 18.9 08/10/2021    No results found for: "TOTALPROTELP", "ALBUMINELP", "A1GS", "A2GS", "BETS", "BETA2SER", "GAMS", "MSPIKE", "SPEI" No results found for: "TIBC", "FERRITIN", "IRONPCTSAT" No results found for: "LDH"   STUDIES:   CT CHEST ABDOMEN PELVIS W CONTRAST  Result Date: 10/26/2022 CLINICAL DATA:  Non-small cell lung cancer, on immunotherapy. History of breast cancer. * Tracking Code: BO * EXAM: CT CHEST, ABDOMEN, AND PELVIS WITH CONTRAST TECHNIQUE: Multidetector CT imaging of the chest, abdomen and pelvis was performed following the standard protocol during bolus administration of intravenous contrast. RADIATION DOSE REDUCTION: This exam was performed according to the departmental dose-optimization program which includes automated exposure control, adjustment of the mA and/or kV according to patient size and/or use of iterative reconstruction technique. CONTRAST:  100mL OMNIPAQUE IOHEXOL 300 MG/ML  SOLN COMPARISON:  CT chest 08/03/2022 and PET 01/27/2022. FINDINGS: CT CHEST FINDINGS Cardiovascular: Right IJ Port-A-Cath terminates in the low SVC. Atherosclerotic calcification of the aorta, aortic valve and coronary arteries. Heart is enlarged. Small pericardial effusion, similar. Mediastinum/Nodes: Heterogeneous right thyroid mass measures 3.0 x 4.0 cm. Previous right thyroid biopsy 08/04/2021. No follow-up recommended unless clinically warranted. (Ref: J Am Coll Radiol. 2015 Feb;12(2): 143-50).No pathologically enlarged mediastinal, hilar or axillary lymph nodes. Esophagus is grossly unremarkable. Lungs/Pleura: Centrilobular emphysema. Subpleural consolidation in the medial segment right middle lobe has decreased in size but is  more nodular in appearance, measuring 1.6 x 1.9 cm (3/91). Scattered subpleural scarring in the right hemithorax. Post  radiation scarring in the left hemithorax, primarily in the left perihilar region, as before. Trace loculated medial left pleural fluid. Slight mass effect on the trachea from the right thyroid nodule discussed above. Musculoskeletal: Degenerative changes in the spine. Similar slight compression of the T5 vertebral body with a probable underlying hemangioma. No worrisome lytic or sclerotic lesions. CT ABDOMEN PELVIS FINDINGS Hepatobiliary: Liver is unremarkable. Cholecystectomy. No unexpected biliary ductal dilatation. Pancreas: Negative. Spleen: Negative. Adrenals/Urinary Tract: Adrenal glands are unremarkable. Scattered scarring in the kidneys. Kidneys are otherwise unremarkable. Ureters are decompressed. Bladder is grossly unremarkable. Stomach/Bowel: Stomach, small bowel, appendix and colon are unremarkable. Vascular/Lymphatic: Atherosclerotic calcification of the aorta. No pathologically enlarged lymph nodes. Reproductive: Fluid in the endometrial canal.  No adnexal mass. Other: Small left inguinal hernia contains fat. Mesenteries and peritoneum are unremarkable. No free fluid. Musculoskeletal: Right hip arthroplasty. Degenerative changes in the spine. No worrisome lytic or sclerotic lesions. IMPRESSION: 1. Decrease in subpleural consolidation in the medial segment right middle lobe, now somewhat nodular in appearance. Recommend attention on follow-up metastatic disease cannot be excluded. 2. Small pericardial effusion, stable. 3. Fluid in the endometrial canal can be seen with cervical stenosis. Please correlate clinically. 4. Aortic atherosclerosis (ICD10-I70.0). Coronary artery calcification. 5.  Emphysema (ICD10-J43.9). Electronically Signed   By: Leanna Battles M.D.   On: 10/26/2022 13:19

## 2022-11-02 ENCOUNTER — Inpatient Hospital Stay: Payer: Medicare HMO

## 2022-11-02 ENCOUNTER — Inpatient Hospital Stay (HOSPITAL_BASED_OUTPATIENT_CLINIC_OR_DEPARTMENT_OTHER): Payer: Medicare HMO | Admitting: Hematology

## 2022-11-02 ENCOUNTER — Inpatient Hospital Stay: Payer: Medicare HMO | Attending: Hematology and Oncology

## 2022-11-02 VITALS — BP 122/81 | HR 68 | Temp 98.6°F | Resp 18

## 2022-11-02 DIAGNOSIS — Z807 Family history of other malignant neoplasms of lymphoid, hematopoietic and related tissues: Secondary | ICD-10-CM | POA: Insufficient documentation

## 2022-11-02 DIAGNOSIS — J439 Emphysema, unspecified: Secondary | ICD-10-CM | POA: Diagnosis not present

## 2022-11-02 DIAGNOSIS — I700 Atherosclerosis of aorta: Secondary | ICD-10-CM | POA: Insufficient documentation

## 2022-11-02 DIAGNOSIS — C3490 Malignant neoplasm of unspecified part of unspecified bronchus or lung: Secondary | ICD-10-CM

## 2022-11-02 DIAGNOSIS — Z833 Family history of diabetes mellitus: Secondary | ICD-10-CM | POA: Diagnosis not present

## 2022-11-02 DIAGNOSIS — Z8041 Family history of malignant neoplasm of ovary: Secondary | ICD-10-CM | POA: Diagnosis not present

## 2022-11-02 DIAGNOSIS — Z800 Family history of malignant neoplasm of digestive organs: Secondary | ICD-10-CM | POA: Insufficient documentation

## 2022-11-02 DIAGNOSIS — E876 Hypokalemia: Secondary | ICD-10-CM | POA: Diagnosis not present

## 2022-11-02 DIAGNOSIS — Z809 Family history of malignant neoplasm, unspecified: Secondary | ICD-10-CM | POA: Insufficient documentation

## 2022-11-02 DIAGNOSIS — Z79620 Long term (current) use of immunosuppressive biologic: Secondary | ICD-10-CM | POA: Insufficient documentation

## 2022-11-02 DIAGNOSIS — Z923 Personal history of irradiation: Secondary | ICD-10-CM | POA: Diagnosis not present

## 2022-11-02 DIAGNOSIS — R059 Cough, unspecified: Secondary | ICD-10-CM | POA: Insufficient documentation

## 2022-11-02 DIAGNOSIS — Z832 Family history of diseases of the blood and blood-forming organs and certain disorders involving the immune mechanism: Secondary | ICD-10-CM | POA: Insufficient documentation

## 2022-11-02 DIAGNOSIS — Z79899 Other long term (current) drug therapy: Secondary | ICD-10-CM | POA: Diagnosis not present

## 2022-11-02 DIAGNOSIS — Z9221 Personal history of antineoplastic chemotherapy: Secondary | ICD-10-CM | POA: Insufficient documentation

## 2022-11-02 DIAGNOSIS — Z87891 Personal history of nicotine dependence: Secondary | ICD-10-CM | POA: Insufficient documentation

## 2022-11-02 DIAGNOSIS — Z5112 Encounter for antineoplastic immunotherapy: Secondary | ICD-10-CM | POA: Diagnosis not present

## 2022-11-02 DIAGNOSIS — E039 Hypothyroidism, unspecified: Secondary | ICD-10-CM | POA: Diagnosis not present

## 2022-11-02 DIAGNOSIS — Z818 Family history of other mental and behavioral disorders: Secondary | ICD-10-CM | POA: Insufficient documentation

## 2022-11-02 DIAGNOSIS — D696 Thrombocytopenia, unspecified: Secondary | ICD-10-CM | POA: Insufficient documentation

## 2022-11-02 DIAGNOSIS — C3432 Malignant neoplasm of lower lobe, left bronchus or lung: Secondary | ICD-10-CM | POA: Insufficient documentation

## 2022-11-02 DIAGNOSIS — Z825 Family history of asthma and other chronic lower respiratory diseases: Secondary | ICD-10-CM | POA: Insufficient documentation

## 2022-11-02 DIAGNOSIS — Z8051 Family history of malignant neoplasm of kidney: Secondary | ICD-10-CM | POA: Insufficient documentation

## 2022-11-02 DIAGNOSIS — Z8249 Family history of ischemic heart disease and other diseases of the circulatory system: Secondary | ICD-10-CM | POA: Insufficient documentation

## 2022-11-02 DIAGNOSIS — C321 Malignant neoplasm of supraglottis: Secondary | ICD-10-CM | POA: Diagnosis not present

## 2022-11-02 DIAGNOSIS — R42 Dizziness and giddiness: Secondary | ICD-10-CM | POA: Diagnosis not present

## 2022-11-02 DIAGNOSIS — R072 Precordial pain: Secondary | ICD-10-CM | POA: Insufficient documentation

## 2022-11-02 DIAGNOSIS — Z853 Personal history of malignant neoplasm of breast: Secondary | ICD-10-CM | POA: Diagnosis not present

## 2022-11-02 DIAGNOSIS — K5900 Constipation, unspecified: Secondary | ICD-10-CM | POA: Insufficient documentation

## 2022-11-02 DIAGNOSIS — R0602 Shortness of breath: Secondary | ICD-10-CM | POA: Diagnosis not present

## 2022-11-02 DIAGNOSIS — R070 Pain in throat: Secondary | ICD-10-CM | POA: Insufficient documentation

## 2022-11-02 LAB — CBC WITH DIFFERENTIAL/PLATELET
Abs Immature Granulocytes: 0.02 10*3/uL (ref 0.00–0.07)
Basophils Absolute: 0 10*3/uL (ref 0.0–0.1)
Basophils Relative: 1 %
Eosinophils Absolute: 0.2 10*3/uL (ref 0.0–0.5)
Eosinophils Relative: 4 %
HCT: 40.8 % (ref 36.0–46.0)
Hemoglobin: 13.6 g/dL (ref 12.0–15.0)
Immature Granulocytes: 0 %
Lymphocytes Relative: 14 %
Lymphs Abs: 0.6 10*3/uL — ABNORMAL LOW (ref 0.7–4.0)
MCH: 30 pg (ref 26.0–34.0)
MCHC: 33.3 g/dL (ref 30.0–36.0)
MCV: 90.1 fL (ref 80.0–100.0)
Monocytes Absolute: 0.3 10*3/uL (ref 0.1–1.0)
Monocytes Relative: 5 %
Neutro Abs: 3.5 10*3/uL (ref 1.7–7.7)
Neutrophils Relative %: 76 %
Platelets: 157 10*3/uL (ref 150–400)
RBC: 4.53 MIL/uL (ref 3.87–5.11)
RDW: 12.6 % (ref 11.5–15.5)
WBC: 4.6 10*3/uL (ref 4.0–10.5)
nRBC: 0 % (ref 0.0–0.2)

## 2022-11-02 LAB — COMPREHENSIVE METABOLIC PANEL
ALT: 9 U/L (ref 0–44)
AST: 11 U/L — ABNORMAL LOW (ref 15–41)
Albumin: 3.6 g/dL (ref 3.5–5.0)
Alkaline Phosphatase: 64 U/L (ref 38–126)
Anion gap: 3 — ABNORMAL LOW (ref 5–15)
BUN: 14 mg/dL (ref 8–23)
CO2: 24 mmol/L (ref 22–32)
Calcium: 8.9 mg/dL (ref 8.9–10.3)
Chloride: 108 mmol/L (ref 98–111)
Creatinine, Ser: 0.82 mg/dL (ref 0.44–1.00)
GFR, Estimated: >60 mL/min (ref >60)
Glucose, Bld: 106 mg/dL — ABNORMAL HIGH (ref 70–99)
Potassium: 4.3 mmol/L (ref 3.5–5.1)
Sodium: 135 mmol/L (ref 135–145)
Total Bilirubin: 1.5 mg/dL — ABNORMAL HIGH (ref 0.3–1.2)
Total Protein: 6.6 g/dL (ref 6.5–8.1)

## 2022-11-02 LAB — MAGNESIUM: Magnesium: 2.1 mg/dL (ref 1.7–2.4)

## 2022-11-02 LAB — TSH: TSH: 14.769 u[IU]/mL — ABNORMAL HIGH (ref 0.350–4.500)

## 2022-11-02 MED ORDER — HEPARIN SOD (PORK) LOCK FLUSH 100 UNIT/ML IV SOLN
500.0000 [IU] | Freq: Once | INTRAVENOUS | Status: AC | PRN
  Administered 2022-11-02: 500 [IU]

## 2022-11-02 MED ORDER — SODIUM CHLORIDE 0.9% FLUSH
10.0000 mL | INTRAVENOUS | Status: DC | PRN
  Administered 2022-11-02: 10 mL

## 2022-11-02 MED ORDER — SODIUM CHLORIDE 0.9 % IV SOLN: Freq: Once | INTRAVENOUS | Status: AC

## 2022-11-02 MED ORDER — SODIUM CHLORIDE 0.9 % IV SOLN
10.0000 mg/kg | Freq: Once | INTRAVENOUS | Status: AC
  Administered 2022-11-02: 860 mg via INTRAVENOUS
  Filled 2022-11-02: qty 10

## 2022-11-02 MED ORDER — SODIUM CHLORIDE 0.9% FLUSH
10.0000 mL | INTRAVENOUS | Status: AC
  Administered 2022-11-02: 10 mL

## 2022-11-02 MED ORDER — LEVOTHYROXINE SODIUM 100 MCG PO TABS: 100.0000 ug | ORAL_TABLET | Freq: Every day | ORAL | 2 refills | Status: DC

## 2022-11-02 NOTE — Progress Notes (Signed)
Patient presents today for chemotherapy infusion. Patient is in satisfactory condition with no new complaints voiced.  Vital signs are stable.  Labs reviewed by Dr. Katragadda during the office visit and all labs are within treatment parameters.  We will proceed with treatment per MD orders.   Patient tolerated treatment well with no complaints voiced.  Patient left ambulatory in stable condition.  Vital signs stable at discharge.  Follow up as scheduled.       

## 2022-11-02 NOTE — Progress Notes (Signed)
Patient has been examined by Dr. Katragadda. Vital signs and labs have been reviewed by MD - ANC, Creatinine, LFTs, hemoglobin, and platelets are within treatment parameters per M.D. - pt may proceed with treatment.  Primary RN and pharmacy notified.  

## 2022-11-02 NOTE — Patient Instructions (Addendum)
[facility] at [facility] Discharge Instructions   You were seen and examined today by Dr. Ellin Saba.  He reviewed the results of your lab work which are mostly normal/stable. Your thyroid function is elevated. Dr. Kirtland Bouchard has gone up on the dose of your Synthroid. Take 2 pills of what you have now every morning on an empty stomach. A new prescription has been sent to your pharmacy for the new dose.   He reviewed the results of your CT scan which is stable. The areas in the lungs have decreased in size.   We will proceed with your treatment today.   Return as scheduled.    Thank you for choosing [facility] at [facility] to provide your oncology and hematology care.  To afford each patient quality time with our provider, please arrive at least 15 minutes before your scheduled appointment time.   If you have a lab appointment with the Cancer Center please come in thru the Main Entrance and check in at the main information desk.  You need to re-schedule your appointment should you arrive 10 or more minutes late.  We strive to give you quality time with our providers, and arriving late affects you and other patients whose appointments are after yours.  Also, if you no show three or more times for appointments you may be dismissed from the clinic at the providers discretion.     Again, thank you for choosing [facility].  Our hope is that these requests will decrease the amount of time that you wait before being seen by our physicians.       _____________________________________________________________  Should you have questions after your visit to [facility], please contact our office at [phone] and follow the prompts.  Our office hours are 8:00 a.m. and 4:30 p.m. Monday - Friday.  Please note that voicemails left after 4:00 p.m. may not be returned until the following business day.  We are closed weekends and major  holidays.  You do have access to a nurse 24-7, just call the main number to the clinic [phone] and do not press any options, hold on the line and a nurse will answer the phone.    For prescription refill requests, have your pharmacy contact our office and allow 72 hours.    Due to Covid, you will need to wear a mask upon entering the hospital. If you do not have a mask, a mask will be given to you at the Main Entrance upon arrival. For doctor visits, patients may have 1 support person age 64 or older with them. For treatment visits, patients can not have anyone with them due to social distancing guidelines and our immunocompromised population.

## 2022-11-02 NOTE — Patient Instructions (Signed)
[facility]  Discharge Instructions: Thank you for choosing [facility] to provide your oncology and hematology care.  If you have a lab appointment with the Cancer Center - please note that after April 8th, 2024, all labs will be drawn in the cancer center.  You do not have to check in or register with the main entrance as you have in the past but will complete your check-in in the cancer center.  Wear comfortable clothing and clothing appropriate for easy access to any Portacath or PICC line.   We strive to give you quality time with your provider. You may need to reschedule your appointment if you arrive late (15 or more minutes).  Arriving late affects you and other patients whose appointments are after yours.  Also, if you miss three or more appointments without notifying the office, you may be dismissed from the clinic at the provider's discretion.      For prescription refill requests, have your pharmacy contact our office and allow 72 hours for refills to be completed.    Today you received the following chemotherapy and/or immunotherapy agents Imfinzi.  Durvalumab Injection What is this medication? DURVALUMAB (dur VAL ue mab) treats some types of cancer. It works by helping your immune system slow or stop the spread of cancer cells. It is a monoclonal antibody. This medicine may be used for other purposes; ask your health care provider or pharmacist if you have questions. COMMON BRAND NAME(S): IMFINZI What should I tell my care team before I take this medication? They need to know if you have any of these conditions: Allogeneic stem cell transplant (uses someone else's stem cells) Autoimmune diseases, such as Crohn disease, ulcerative colitis, lupus History of chest radiation Nervous system problems, such as Guillain-Barre syndrome, myasthenia gravis Organ transplant An unusual or allergic reaction to durvalumab, other medications, foods, dyes, or  preservatives Pregnant or trying to get pregnant Breast-feeding How should I use this medication? This medication is infused into a vein. It is given by your care team in a hospital or clinic setting. A special MedGuide will be given to you before each treatment. Be sure to read this information carefully each time. Talk to your care team about the use of this medication in children. Special care may be needed. Overdosage: If you think you have taken too much of this medicine contact a poison control center or emergency room at once. NOTE: This medicine is only for you. Do not share this medicine with others. What if I miss a dose? Keep appointments for follow-up doses. It is important not to miss your dose. Call your care team if you are unable to keep an appointment. What may interact with this medication? Interactions have not been studied. This list may not describe all possible interactions. Give your health care provider a list of all the medicines, herbs, non-prescription drugs, or dietary supplements you use. Also tell them if you smoke, drink alcohol, or use illegal drugs. Some items may interact with your medicine. What should I watch for while using this medication? Your condition will be monitored carefully while you are receiving this medication. You may need blood work while taking this medication. This medication may cause serious skin reactions. They can happen weeks to months after starting the medication. Contact your care team right away if you notice fevers or flu-like symptoms with a rash. The rash may be red or purple and then turn into blisters or peeling of  the skin. You may also notice a red rash with swelling of the face, lips, or lymph nodes in your neck or under your arms. Tell your care team right away if you have any change in your eyesight. Talk to your care team if you may be pregnant. Serious birth defects can occur if you take this medication during pregnancy and  for 3 months after the last dose. You will need a negative pregnancy test before starting this medication. Contraception is recommended while taking this medication and for 3 months after the last dose. Your care team can help you find the option that works for you. Do not breastfeed while taking this medication and for 3 months after the last dose. What side effects may I notice from receiving this medication? Side effects that you should report to your care team as soon as possible: Allergic reactions--skin rash, itching, hives, swelling of the face, lips, tongue, or throat Dry cough, shortness of breath or trouble breathing Eye pain, redness, irritation, or discharge with blurry or decreased vision Heart muscle inflammation--unusual weakness or fatigue, shortness of breath, chest pain, fast or irregular heartbeat, dizziness, swelling of the ankles, feet, or hands Hormone gland problems--headache, sensitivity to light, unusual weakness or fatigue, dizziness, fast or irregular heartbeat, increased sensitivity to cold or heat, excessive sweating, constipation, hair loss, increased thirst or amount of urine, tremors or shaking, irritability Infusion reactions--chest pain, shortness of breath or trouble breathing, feeling faint or lightheaded Kidney injury (glomerulonephritis)--decrease in the amount of urine, red or dark Anastasija Anfinson urine, foamy or bubbly urine, swelling of the ankles, hands, or feet Liver injury--right upper belly pain, loss of appetite, nausea, light-colored stool, dark yellow or Milan Clare urine, yellowing skin or eyes, unusual weakness or fatigue Pain, tingling, or numbness in the hands or feet, muscle weakness, change in vision, confusion or trouble speaking, loss of balance or coordination, trouble walking, seizures Rash, fever, and swollen lymph nodes Redness, blistering, peeling, or loosening of the skin, including inside the mouth Sudden or severe stomach pain, bloody diarrhea, fever,  nausea, vomiting Side effects that usually do not require medical attention (report these to your care team if they continue or are bothersome): Bone, joint, or muscle pain Diarrhea Fatigue Loss of appetite Nausea Skin rash This list may not describe all possible side effects. Call your doctor for medical advice about side effects. You may report side effects to FDA at 1-800-FDA-1088. Where should I keep my medication? This medication is given in a hospital or clinic. It will not be stored at home. NOTE: This sheet is a summary. It may not cover all possible information. If you have questions about this medicine, talk to your doctor, pharmacist, or health care provider.  2023 Elsevier/Gold Standard (2021-11-12 00:00:00)        To help prevent nausea and vomiting after your treatment, we encourage you to take your nausea medication as directed.  BELOW ARE SYMPTOMS THAT SHOULD BE REPORTED IMMEDIATELY: *FEVER GREATER THAN 100.4 F (38 C) OR HIGHER *CHILLS OR SWEATING *NAUSEA AND VOMITING THAT IS NOT CONTROLLED WITH YOUR NAUSEA MEDICATION *UNUSUAL SHORTNESS OF BREATH *UNUSUAL BRUISING OR BLEEDING *URINARY PROBLEMS (pain or burning when urinating, or frequent urination) *BOWEL PROBLEMS (unusual diarrhea, constipation, pain near the anus) TENDERNESS IN MOUTH AND THROAT WITH OR WITHOUT PRESENCE OF ULCERS (sore throat, sores in mouth, or a toothache) UNUSUAL RASH, SWELLING OR PAIN  UNUSUAL VAGINAL DISCHARGE OR ITCHING   Items with * indicate a potential emergency and should be  followed up as soon as possible or go to the Emergency Department if any problems should occur.  Please show the CHEMOTHERAPY ALERT CARD or IMMUNOTHERAPY ALERT CARD at check-in to the Emergency Department and triage nurse.  Should you have questions after your visit or need to cancel or reschedule your appointment, please contact [facility] [phone]  and follow the prompts.  Office hours  are 8:00 a.m. to 4:30 p.m. Monday - Friday. Please note that voicemails left after 4:00 p.m. may not be returned until the following business day.  We are closed weekends and major holidays. You have access to a nurse at all times for urgent questions. Please call the main number to the clinic [phone] and follow the prompts.  For any non-urgent questions, you may also contact your provider using MyChart. We now offer e-Visits for anyone 74 and older to request care online for non-urgent symptoms. For details visit [email or web address].   Also download the MyChart app! Go to the app store, search "MyChart", open the app, select , and log in with your MyChart username and password.

## 2022-11-04 ENCOUNTER — Other Ambulatory Visit: Payer: Self-pay

## 2022-11-08 ENCOUNTER — Other Ambulatory Visit: Payer: Medicare HMO

## 2022-11-08 ENCOUNTER — Ambulatory Visit: Payer: Medicare HMO

## 2022-11-08 ENCOUNTER — Ambulatory Visit: Payer: Medicare HMO | Admitting: Hematology

## 2022-11-16 ENCOUNTER — Inpatient Hospital Stay: Payer: Medicare HMO

## 2022-11-16 ENCOUNTER — Other Ambulatory Visit: Payer: Self-pay

## 2022-11-16 VITALS — BP 107/70 | HR 87 | Temp 97.8°F | Resp 20 | Wt 191.0 lb

## 2022-11-16 VITALS — BP 122/74 | HR 74 | Temp 97.9°F | Resp 18

## 2022-11-16 DIAGNOSIS — E876 Hypokalemia: Secondary | ICD-10-CM | POA: Diagnosis not present

## 2022-11-16 DIAGNOSIS — Z853 Personal history of malignant neoplasm of breast: Secondary | ICD-10-CM | POA: Diagnosis not present

## 2022-11-16 DIAGNOSIS — R0602 Shortness of breath: Secondary | ICD-10-CM | POA: Diagnosis not present

## 2022-11-16 DIAGNOSIS — Z800 Family history of malignant neoplasm of digestive organs: Secondary | ICD-10-CM | POA: Diagnosis not present

## 2022-11-16 DIAGNOSIS — Z87891 Personal history of nicotine dependence: Secondary | ICD-10-CM | POA: Diagnosis not present

## 2022-11-16 DIAGNOSIS — K5900 Constipation, unspecified: Secondary | ICD-10-CM | POA: Diagnosis not present

## 2022-11-16 DIAGNOSIS — J439 Emphysema, unspecified: Secondary | ICD-10-CM | POA: Diagnosis not present

## 2022-11-16 DIAGNOSIS — Z79620 Long term (current) use of immunosuppressive biologic: Secondary | ICD-10-CM | POA: Diagnosis not present

## 2022-11-16 DIAGNOSIS — C3490 Malignant neoplasm of unspecified part of unspecified bronchus or lung: Secondary | ICD-10-CM

## 2022-11-16 DIAGNOSIS — I700 Atherosclerosis of aorta: Secondary | ICD-10-CM | POA: Diagnosis not present

## 2022-11-16 DIAGNOSIS — Z170 Estrogen receptor positive status [ER+]: Secondary | ICD-10-CM

## 2022-11-16 DIAGNOSIS — C321 Malignant neoplasm of supraglottis: Secondary | ICD-10-CM | POA: Diagnosis not present

## 2022-11-16 DIAGNOSIS — Z832 Family history of diseases of the blood and blood-forming organs and certain disorders involving the immune mechanism: Secondary | ICD-10-CM | POA: Diagnosis not present

## 2022-11-16 DIAGNOSIS — D696 Thrombocytopenia, unspecified: Secondary | ICD-10-CM | POA: Diagnosis not present

## 2022-11-16 DIAGNOSIS — E039 Hypothyroidism, unspecified: Secondary | ICD-10-CM | POA: Diagnosis not present

## 2022-11-16 DIAGNOSIS — R42 Dizziness and giddiness: Secondary | ICD-10-CM | POA: Diagnosis not present

## 2022-11-16 DIAGNOSIS — R072 Precordial pain: Secondary | ICD-10-CM | POA: Diagnosis not present

## 2022-11-16 DIAGNOSIS — Z79899 Other long term (current) drug therapy: Secondary | ICD-10-CM | POA: Diagnosis not present

## 2022-11-16 DIAGNOSIS — Z95828 Presence of other vascular implants and grafts: Secondary | ICD-10-CM

## 2022-11-16 DIAGNOSIS — R070 Pain in throat: Secondary | ICD-10-CM | POA: Diagnosis not present

## 2022-11-16 DIAGNOSIS — R059 Cough, unspecified: Secondary | ICD-10-CM | POA: Diagnosis not present

## 2022-11-16 DIAGNOSIS — Z5112 Encounter for antineoplastic immunotherapy: Secondary | ICD-10-CM | POA: Diagnosis not present

## 2022-11-16 DIAGNOSIS — Z833 Family history of diabetes mellitus: Secondary | ICD-10-CM | POA: Diagnosis not present

## 2022-11-16 DIAGNOSIS — Z8041 Family history of malignant neoplasm of ovary: Secondary | ICD-10-CM | POA: Diagnosis not present

## 2022-11-16 DIAGNOSIS — Z9221 Personal history of antineoplastic chemotherapy: Secondary | ICD-10-CM | POA: Diagnosis not present

## 2022-11-16 DIAGNOSIS — C3432 Malignant neoplasm of lower lobe, left bronchus or lung: Secondary | ICD-10-CM | POA: Diagnosis not present

## 2022-11-16 DIAGNOSIS — Z923 Personal history of irradiation: Secondary | ICD-10-CM | POA: Diagnosis not present

## 2022-11-16 LAB — CBC WITH DIFFERENTIAL/PLATELET
Abs Immature Granulocytes: 0.01 10*3/uL (ref 0.00–0.07)
Basophils Absolute: 0 10*3/uL (ref 0.0–0.1)
Basophils Relative: 1 %
Eosinophils Absolute: 0.2 10*3/uL (ref 0.0–0.5)
Eosinophils Relative: 4 %
HCT: 43.4 % (ref 36.0–46.0)
Hemoglobin: 15 g/dL (ref 12.0–15.0)
Immature Granulocytes: 0 %
Lymphocytes Relative: 17 %
Lymphs Abs: 1 10*3/uL (ref 0.7–4.0)
MCH: 30.3 pg (ref 26.0–34.0)
MCHC: 34.6 g/dL (ref 30.0–36.0)
MCV: 87.7 fL (ref 80.0–100.0)
Monocytes Absolute: 0.4 10*3/uL (ref 0.1–1.0)
Monocytes Relative: 7 %
Neutro Abs: 4 10*3/uL (ref 1.7–7.7)
Neutrophils Relative %: 71 %
Platelets: 215 10*3/uL (ref 150–400)
RBC: 4.95 MIL/uL (ref 3.87–5.11)
RDW: 12.5 % (ref 11.5–15.5)
WBC: 5.6 10*3/uL (ref 4.0–10.5)
nRBC: 0 % (ref 0.0–0.2)

## 2022-11-16 LAB — COMPREHENSIVE METABOLIC PANEL
ALT: 11 U/L (ref 0–44)
AST: 14 U/L — ABNORMAL LOW (ref 15–41)
Albumin: 3.8 g/dL (ref 3.5–5.0)
Alkaline Phosphatase: 74 U/L (ref 38–126)
Anion gap: 8 (ref 5–15)
BUN: 19 mg/dL (ref 8–23)
CO2: 29 mmol/L (ref 22–32)
Calcium: 9.3 mg/dL (ref 8.9–10.3)
Chloride: 99 mmol/L (ref 98–111)
Creatinine, Ser: 0.89 mg/dL (ref 0.44–1.00)
GFR, Estimated: >60 mL/min (ref >60)
Glucose, Bld: 119 mg/dL — ABNORMAL HIGH (ref 70–99)
Potassium: 3.2 mmol/L — ABNORMAL LOW (ref 3.5–5.1)
Sodium: 136 mmol/L (ref 135–145)
Total Bilirubin: 2 mg/dL — ABNORMAL HIGH (ref 0.3–1.2)
Total Protein: 7.3 g/dL (ref 6.5–8.1)

## 2022-11-16 LAB — MAGNESIUM: Magnesium: 2.2 mg/dL (ref 1.7–2.4)

## 2022-11-16 MED ORDER — SODIUM CHLORIDE 0.9% FLUSH
10.0000 mL | INTRAVENOUS | Status: DC | PRN
  Administered 2022-11-16: 10 mL

## 2022-11-16 MED ORDER — SODIUM CHLORIDE 0.9 % IV SOLN
10.0000 mg/kg | Freq: Once | INTRAVENOUS | Status: AC
  Administered 2022-11-16: 860 mg via INTRAVENOUS
  Filled 2022-11-16: qty 17.2

## 2022-11-16 MED ORDER — SODIUM CHLORIDE 0.9% FLUSH
10.0000 mL | Freq: Once | INTRAVENOUS | Status: AC
  Administered 2022-11-16: 10 mL via INTRAVENOUS

## 2022-11-16 MED ORDER — HEPARIN SOD (PORK) LOCK FLUSH 100 UNIT/ML IV SOLN
500.0000 [IU] | Freq: Once | INTRAVENOUS | Status: AC | PRN
  Administered 2022-11-16: 500 [IU]

## 2022-11-16 MED ORDER — SODIUM CHLORIDE 0.9 % IV SOLN: Freq: Once | INTRAVENOUS | Status: AC

## 2022-11-16 NOTE — Progress Notes (Signed)
Patient presents today for Imfinzi infusion.  Patient is in satisfactory condition with no new complaints voiced.  Vital signs are stable.  Labs reviewed.  Bilirubin today is 2.0  MD made aware.  All other labs are within treatment parameters.  Potassium today is 3.2.  Patient took her own Klor Con 40 mEq powder PO x one dose here in the office. She did not want to take tablets.  We will proceed with treatment per MD orders.    Patient tolerated treatment well with no complaints voiced.  Patient left ambulatory in stable condition.  Vital signs stable at discharge.  Follow up as scheduled.

## 2022-11-16 NOTE — Progress Notes (Signed)
OK to proceed with Imfinzi with tBili 2.0   T.O. Dr Carilyn Goodpasture, PharmD

## 2022-11-16 NOTE — Patient Instructions (Signed)
[facility] [place]  Discharge Instructions: Thank you for choosing [facility] to provide your oncology and hematology care.  If you have a lab appointment with the Cancer Center - please note that after April 8th, 2024, all labs will be drawn in the cancer center.  You do not have to check in or register with the main entrance as you have in the past but will complete your check-in in the cancer center.  Wear comfortable clothing and clothing appropriate for easy access to any Portacath or PICC line.   We strive to give you quality time with your provider. You may need to reschedule your appointment if you arrive late (15 or more minutes).  Arriving late affects you and other patients whose appointments are after yours.  Also, if you miss three or more appointments without notifying the office, you may be dismissed from the clinic at the provider's discretion.      For prescription refill requests, have your pharmacy contact our office and allow 72 hours for refills to be completed.    Today you received the following chemotherapy and/or immunotherapy agents Imfinzi.  Durvalumab Injection What is this medication? DURVALUMAB (dur VAL ue mab) treats some types of cancer. It works by helping your immune system slow or stop the spread of cancer cells. It is a monoclonal antibody. This medicine may be used for other purposes; ask your health care provider or pharmacist if you have questions. COMMON BRAND NAME(S): IMFINZI What should I tell my care team before I take this medication? They need to know if you have any of these conditions: Allogeneic stem cell transplant (uses someone else's stem cells) Autoimmune diseases, such as Crohn disease, ulcerative colitis, lupus History of chest radiation Nervous system problems, such as Guillain-Barre syndrome, myasthenia gravis Organ transplant An unusual or allergic reaction to durvalumab, other medications, foods, dyes, or  preservatives Pregnant or trying to get pregnant Breast-feeding How should I use this medication? This medication is infused into a vein. It is given by your care team in a hospital or clinic setting. A special MedGuide will be given to you before each treatment. Be sure to read this information carefully each time. Talk to your care team about the use of this medication in children. Special care may be needed. Overdosage: If you think you have taken too much of this medicine contact a poison control center or emergency room at once. NOTE: This medicine is only for you. Do not share this medicine with others. What if I miss a dose? Keep appointments for follow-up doses. It is important not to miss your dose. Call your care team if you are unable to keep an appointment. What may interact with this medication? Interactions have not been studied. This list may not describe all possible interactions. Give your health care provider a list of all the medicines, herbs, non-prescription drugs, or dietary supplements you use. Also tell them if you smoke, drink alcohol, or use illegal drugs. Some items may interact with your medicine. What should I watch for while using this medication? Your condition will be monitored carefully while you are receiving this medication. You may need blood work while taking this medication. This medication may cause serious skin reactions. They can happen weeks to months after starting the medication. Contact your care team right away if you notice fevers or flu-like symptoms with a rash. The rash may be red or purple and then turn into blisters or peeling of   the skin. You may also notice a red rash with swelling of the face, lips, or lymph nodes in your neck or under your arms. Tell your care team right away if you have any change in your eyesight. Talk to your care team if you may be pregnant. Serious birth defects can occur if you take this medication during pregnancy and  for 3 months after the last dose. You will need a negative pregnancy test before starting this medication. Contraception is recommended while taking this medication and for 3 months after the last dose. Your care team can help you find the option that works for you. Do not breastfeed while taking this medication and for 3 months after the last dose. What side effects may I notice from receiving this medication? Side effects that you should report to your care team as soon as possible: Allergic reactions--skin rash, itching, hives, swelling of the face, lips, tongue, or throat Dry cough, shortness of breath or trouble breathing Eye pain, redness, irritation, or discharge with blurry or decreased vision Heart muscle inflammation--unusual weakness or fatigue, shortness of breath, chest pain, fast or irregular heartbeat, dizziness, swelling of the ankles, feet, or hands Hormone gland problems--headache, sensitivity to light, unusual weakness or fatigue, dizziness, fast or irregular heartbeat, increased sensitivity to cold or heat, excessive sweating, constipation, hair loss, increased thirst or amount of urine, tremors or shaking, irritability Infusion reactions--chest pain, shortness of breath or trouble breathing, feeling faint or lightheaded Kidney injury (glomerulonephritis)--decrease in the amount of urine, red or dark Dustine Bertini urine, foamy or bubbly urine, swelling of the ankles, hands, or feet Liver injury--right upper belly pain, loss of appetite, nausea, light-colored stool, dark yellow or Caia Lofaro urine, yellowing skin or eyes, unusual weakness or fatigue Pain, tingling, or numbness in the hands or feet, muscle weakness, change in vision, confusion or trouble speaking, loss of balance or coordination, trouble walking, seizures Rash, fever, and swollen lymph nodes Redness, blistering, peeling, or loosening of the skin, including inside the mouth Sudden or severe stomach pain, bloody diarrhea, fever,  nausea, vomiting Side effects that usually do not require medical attention (report these to your care team if they continue or are bothersome): Bone, joint, or muscle pain Diarrhea Fatigue Loss of appetite Nausea Skin rash This list may not describe all possible side effects. Call your doctor for medical advice about side effects. You may report side effects to FDA at 1-800-FDA-1088. Where should I keep my medication? This medication is given in a hospital or clinic. It will not be stored at home. NOTE: This sheet is a summary. It may not cover all possible information. If you have questions about this medicine, talk to your doctor, pharmacist, or health care provider.  2023 Elsevier/Gold Standard (2021-11-12 00:00:00)        To help prevent nausea and vomiting after your treatment, we encourage you to take your nausea medication as directed.  BELOW ARE SYMPTOMS THAT SHOULD BE REPORTED IMMEDIATELY: *FEVER GREATER THAN 100.4 F (38 C) OR HIGHER *CHILLS OR SWEATING *NAUSEA AND VOMITING THAT IS NOT CONTROLLED WITH YOUR NAUSEA MEDICATION *UNUSUAL SHORTNESS OF BREATH *UNUSUAL BRUISING OR BLEEDING *URINARY PROBLEMS (pain or burning when urinating, or frequent urination) *BOWEL PROBLEMS (unusual diarrhea, constipation, pain near the anus) TENDERNESS IN MOUTH AND THROAT WITH OR WITHOUT PRESENCE OF ULCERS (sore throat, sores in mouth, or a toothache) UNUSUAL RASH, SWELLING OR PAIN  UNUSUAL VAGINAL DISCHARGE OR ITCHING   Items with * indicate a potential emergency and should be   followed up as soon as possible or go to the Emergency Department if any problems should occur.  Please show the CHEMOTHERAPY ALERT CARD or IMMUNOTHERAPY ALERT CARD at check-in to the Emergency Department and triage nurse.  Should you have questions after your visit or need to cancel or reschedule your appointment, please contact [facility] AT  [phone]  and follow the prompts.  Office hours  are 8:00 a.m. to 4:30 p.m. Monday - Friday. Please note that voicemails left after 4:00 p.m. may not be returned until the following business day.  We are closed weekends and major holidays. You have access to a nurse at all times for urgent questions. Please call the main number to the clinic [phone] and follow the prompts.  For any non-urgent questions, you may also contact your provider using MyChart. We now offer e-Visits for anyone 18 and older to request care online for non-urgent symptoms. For details visit [email or web address].   Also download the MyChart app! Go to the app store, search "MyChart", open the app, select New Site, and log in with your MyChart username and password.   

## 2022-11-17 ENCOUNTER — Ambulatory Visit (HOSPITAL_COMMUNITY): Payer: Medicare HMO | Admitting: Speech Pathology

## 2022-11-24 ENCOUNTER — Ambulatory Visit (INDEPENDENT_AMBULATORY_CARE_PROVIDER_SITE_OTHER): Payer: Medicare HMO | Admitting: Orthopaedic Surgery

## 2022-11-24 ENCOUNTER — Other Ambulatory Visit (INDEPENDENT_AMBULATORY_CARE_PROVIDER_SITE_OTHER): Payer: Medicare HMO

## 2022-11-24 DIAGNOSIS — M79604 Pain in right leg: Secondary | ICD-10-CM

## 2022-11-24 NOTE — Progress Notes (Signed)
Office Visit Note   Patient: Regina Mcmahon           Date of Birth: 04/20/1951           MRN: [id number] Visit Date: 11/24/2022              Requested by: Benita Stabile, MD [address] [place],  [place] [address] PCP: Benita Stabile, MD   Assessment & Plan: Visit Diagnoses:  1. Pain in right leg     Plan: She has had 2 minor falls did not have any increase in hip pain.  I encouraged her to use her cane left hand appropriate gait discussed with distance.  She does not have to use a cane in the house.  She has got some osteoporosis as expected with chemo plus radiation now on immunotherapy with cancer diagnosis and weight loss.  She can follow-up as needed.  Discussed with her no intervention required for trochanteric slight displacement.  Follow-Up Instructions: No follow-ups on file.   Orders:  Orders Placed This Encounter  Procedures   XR Lumbar Spine 2-3 Views   XR HIP UNILAT W OR W/O PELVIS 2-3 VIEWS RIGHT   No orders of the defined types were placed in this encounter.     Procedures: No procedures performed   Clinical Data: No additional findings.   Subjective: Chief Complaint  Patient presents with   Right Hip - Pain   Lower Back - Pain    HPI follow-up right total/hip arthroplasty 02/22/2021.  She had a nondisplaced trope fracture she had a couple falls she was concerned about.  In the interim since I saw her she had throat cancer she has had lung cancer both lungs nonoperable she has been through chemo plus radiation and now is on immunotherapy.  She returns to make sure she did not do any damage to her hip.  Review of Systems all other systems noncontributory to HPI other than above.   Objective: Vital Signs: There were no vitals taken for this visit.  Physical Exam Constitutional:      Appearance: She is well-developed.  HENT:     Head: Normocephalic.     Right Ear: External ear normal.     Left Ear: External ear normal. There is no impacted  cerumen.     Mouth/Throat:     Comments: Patient has a hoarse voice.  Slight dyspnea. Eyes:     Pupils: Pupils are equal, round, and reactive to light.  Neck:     Thyroid: No thyromegaly.     Trachea: No tracheal deviation.  Cardiovascular:     Rate and Rhythm: Normal rate.  Chest:     Chest wall: Tenderness present.  Abdominal:     Palpations: Abdomen is soft.  Musculoskeletal:     Cervical back: No rigidity.  Skin:    General: Skin is warm and dry.  Neurological:     Mental Status: She is alert and oriented to person, place, and time.  Psychiatric:        Behavior: Behavior normal.     Ortho Exam patient has slight Trendelenburg gait.  Specialty Comments:  No specialty comments available.  Imaging: XR HIP UNILAT W OR W/O PELVIS 2-3 VIEWS RIGHT  Result Date: 11/24/2022 AP frog-leg right hip obtained and reviewed.  Total hip arthroplasty present. Impression: Right total hip arthroplasty with trochanteric fracture again noted with a few millimeters displacement.  No subsidence or loosening of the femoral stem.  XR Lumbar Spine 2-3 Views  Result Date: 11/24/2022 AP lateral lumbar spine images are obtained and reviewed there is anterolisthesis at L4-5 degenerative in nature.  No evidence of metastatic disease. Impression: Grade 1 anterolisthesis L4-5 degenerative in nature.    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   Abnormal CT of the chest 07/29/2021   History of total right hip arthroplasty 04/15/2021   COPD GOLD II  /  still smoking  10/12/2020   Shortness of breath 09/12/2016   Well woman exam with routine gynecological exam 04/20/2016   Morbid obesity due to excess calories (HCC) 03/03/2016   Colon adenomas 03/03/2016   Rash 01/18/2016   GERD  (gastroesophageal reflux disease) 10/19/2015   Osteopenia determined by x-ray 09/07/2015   High risk medication use 09/07/2015   Genetic testing 03/20/2015   Family history of ovarian cancer 03/20/2015   Family history of colon cancer 03/20/2015   Family history of cancer 03/20/2015   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   COPD (chronic obstructive pulmonary disease) (HCC)    Depression    Dyspnea    GERD (gastroesophageal reflux disease)    Hemorrhage rectum    and anus.   Hypertension    Insomnia    Other symptoms involving digestive system(787.99)    Pain    Hx.pain in joint involving pelvic region and thigh; pain in limb   Palpitations    Panic attacks    Personal history of chemotherapy 2016   Personal history of radiation therapy 2016   Pneumonia    Pre-diabetes    Prediabetes    Sinusitis    Symptomatic menopausal or female climacteric states    Varicose veins of both lower extremities with pain     Family History  Problem Relation Age of Onset   Diabetes Mother    Ovarian cancer Mother 18   Other Father        died in MVA at age 26   Other Sister        mitral valve disorder   Melanoma Sister 9       removed from back of leg   Colon cancer Brother        early 23s, succumbed to disease   Cancer Paternal Aunt        leukemia, bone, or lung cancer   Alzheimer's disease Maternal Grandmother    Heart attack Maternal Grandfather    Heart  attack Paternal Grandmother    COPD Paternal Grandfather    Emphysema Paternal Grandfather    Congestive Heart Failure Paternal Aunt    Stomach cancer Paternal Uncle    Kidney cancer Maternal Uncle    Cancer Cousin  dx. teens/dx. 40s   Cancer Cousin    Colon cancer Cousin     Past Surgical History:  Procedure Laterality Date   BREAST BIOPSY Left 10/2012   BREAST BIOPSY Left 07/01/2014   malignant   BREAST LUMPECTOMY Left 08/20/2014   BRONCHIAL BIOPSY  08/23/2021   Procedure: BRONCHIAL BIOPSIES;  Surgeon: Leslye Peer, MD;  Location: [facility];  Service: Pulmonary;;   BRONCHIAL BRUSHINGS  08/23/2021   Procedure: BRONCHIAL BRUSHINGS;  Surgeon: Leslye Peer, MD;  Location[facility] ENDOSCOPY;  Service: Pulmonary;;   BRONCHIAL NEEDLE ASPIRATION BIOPSY  08/23/2021   Procedure: BRONCHIAL NEEDLE ASPIRATION BIOPSIES;  Surgeon: Leslye Peer, MD;  [facility]COPY;  Service: Pulmonary;;   BRONCHIAL WASHINGS  08/23/2021   Procedure: BRONCHIAL WASHINGS;  Surgeon: Leslye Peer, MD;  Location: [facility];  Service: Pulmonary;;   CESAREAN SECTION     CHOLECYSTECTOMY  1985   COLONOSCOPY  2002   Dr. Cleotis Nipper: internal hemorrhoids, one small rectal polyp, adenomatous path    COLONOSCOPY N/A 11/23/2015   Procedure: COLONOSCOPY;  Surgeon: West Bali, MD;  [facility];  Service: Endoscopy;  Laterality: N/A;  1100 - moved to 10:45 - office to notify   ESOPHAGOGASTRODUODENOSCOPY N/A 11/23/2015   Procedure: ESOPHAGOGASTRODUODENOSCOPY (EGD);  Surgeon: West Bali, MD;  [facility];  Service: Endoscopy;  Laterality: N/A;   IR IMAGING GUIDED PORT INSERTION  11/08/2021   PORT-A-CATH REMOVAL  02/2015   PORTACATH PLACEMENT Right 09/17/2014   Procedure: INSERTION PORT-A-CATH WITH ULTRASOUND;  Surgeon: Harriette Bouillon, MD;  Location: [facility];  Service: General;  Laterality: Right;  IJ   RADIOACTIVE SEED GUIDED PARTIAL MASTECTOMY WITH AXILLARY SENTINEL  LYMPH NODE BIOPSY Left 08/20/2014   Procedure: LEFT BREAST LUMPECTOMY WITH RADIOACTIVE SEED LOCALIZATION AND SENTINEL LYMPH NODE MAPPING;  Surgeon: Harriette Bouillon, MD;  Location: [facility];  Service: General;  Laterality: Left;   RE-EXCISION OF BREAST LUMPECTOMY Left 09/17/2014   Procedure: RE-EXCISION OF BREAST LUMPECTOMY;  Surgeon: Harriette Bouillon, MD;  Location: [facility];  Service: General;  Laterality: Left;   TOTAL HIP ARTHROPLASTY Right 02/22/2021   Procedure: RIGHT TOTAL HIP ARTHROPLASTY ANTERIOR APPROACH;  Surgeon: Eldred Manges, MD;  Location: [facility] OR;  Service: Orthopedics;  Laterality: Right;   TUBAL LIGATION     VIDEO BRONCHOSCOPY WITH ENDOBRONCHIAL ULTRASOUND N/A 08/23/2021   Procedure: VIDEO BRONCHOSCOPY WITH ENDOBRONCHIAL ULTRASOUND;  Surgeon: Leslye Peer, MD;  Location: [facility];  Service: Pulmonary;  Laterality: N/A;   VIDEO BRONCHOSCOPY WITH RADIAL ENDOBRONCHIAL ULTRASOUND  08/23/2021   Procedure: VIDEO BRONCHOSCOPY WITH RADIAL ENDOBRONCHIAL ULTRASOUND;  Surgeon: Leslye Peer, MD;  Location: [facility];  Service: Pulmonary;;   Social History   Occupational History   Not on file  Tobacco Use   Smoking status: Former    Packs/day: 1.00    Years: 42.00    Additional pack years: 0.00    Total pack years: 42.00    Types: Cigarettes    Quit date: 02/22/2021    Years since quitting: 1.7   Smokeless tobacco: Never  Vaping Use   Vaping Use: Never used  Substance and Sexual Activity   Alcohol use: No    Alcohol/week: 0.0 standard drinks of alcohol   Drug use: No   Sexual activity: Never    Birth control/protection: None

## 2022-11-30 ENCOUNTER — Inpatient Hospital Stay: Payer: Medicare HMO | Attending: Hematology and Oncology

## 2022-11-30 ENCOUNTER — Ambulatory Visit: Payer: Medicare HMO | Admitting: Hematology

## 2022-11-30 ENCOUNTER — Inpatient Hospital Stay: Payer: Medicare HMO

## 2022-11-30 VITALS — BP 120/70 | HR 71 | Temp 97.8°F | Resp 18 | Wt 197.8 lb

## 2022-11-30 VITALS — BP 125/70 | HR 70 | Temp 98.3°F | Resp 20

## 2022-11-30 DIAGNOSIS — Z833 Family history of diabetes mellitus: Secondary | ICD-10-CM | POA: Diagnosis not present

## 2022-11-30 DIAGNOSIS — R197 Diarrhea, unspecified: Secondary | ICD-10-CM | POA: Diagnosis not present

## 2022-11-30 DIAGNOSIS — E876 Hypokalemia: Secondary | ICD-10-CM | POA: Insufficient documentation

## 2022-11-30 DIAGNOSIS — R072 Precordial pain: Secondary | ICD-10-CM | POA: Insufficient documentation

## 2022-11-30 DIAGNOSIS — Z8041 Family history of malignant neoplasm of ovary: Secondary | ICD-10-CM | POA: Diagnosis not present

## 2022-11-30 DIAGNOSIS — G479 Sleep disorder, unspecified: Secondary | ICD-10-CM | POA: Diagnosis not present

## 2022-11-30 DIAGNOSIS — C321 Malignant neoplasm of supraglottis: Secondary | ICD-10-CM | POA: Insufficient documentation

## 2022-11-30 DIAGNOSIS — Z79620 Long term (current) use of immunosuppressive biologic: Secondary | ICD-10-CM | POA: Insufficient documentation

## 2022-11-30 DIAGNOSIS — Z9221 Personal history of antineoplastic chemotherapy: Secondary | ICD-10-CM | POA: Insufficient documentation

## 2022-11-30 DIAGNOSIS — R059 Cough, unspecified: Secondary | ICD-10-CM | POA: Insufficient documentation

## 2022-11-30 DIAGNOSIS — C3490 Malignant neoplasm of unspecified part of unspecified bronchus or lung: Secondary | ICD-10-CM

## 2022-11-30 DIAGNOSIS — E039 Hypothyroidism, unspecified: Secondary | ICD-10-CM | POA: Insufficient documentation

## 2022-11-30 DIAGNOSIS — Z800 Family history of malignant neoplasm of digestive organs: Secondary | ICD-10-CM | POA: Insufficient documentation

## 2022-11-30 DIAGNOSIS — Z5112 Encounter for antineoplastic immunotherapy: Secondary | ICD-10-CM | POA: Insufficient documentation

## 2022-11-30 DIAGNOSIS — Z923 Personal history of irradiation: Secondary | ICD-10-CM | POA: Insufficient documentation

## 2022-11-30 DIAGNOSIS — R070 Pain in throat: Secondary | ICD-10-CM | POA: Insufficient documentation

## 2022-11-30 DIAGNOSIS — Z818 Family history of other mental and behavioral disorders: Secondary | ICD-10-CM | POA: Diagnosis not present

## 2022-11-30 DIAGNOSIS — Z825 Family history of asthma and other chronic lower respiratory diseases: Secondary | ICD-10-CM | POA: Diagnosis not present

## 2022-11-30 DIAGNOSIS — R42 Dizziness and giddiness: Secondary | ICD-10-CM | POA: Diagnosis not present

## 2022-11-30 DIAGNOSIS — Z808 Family history of malignant neoplasm of other organs or systems: Secondary | ICD-10-CM | POA: Insufficient documentation

## 2022-11-30 DIAGNOSIS — Z87891 Personal history of nicotine dependence: Secondary | ICD-10-CM | POA: Diagnosis not present

## 2022-11-30 DIAGNOSIS — C3432 Malignant neoplasm of lower lobe, left bronchus or lung: Secondary | ICD-10-CM | POA: Diagnosis not present

## 2022-11-30 DIAGNOSIS — R0602 Shortness of breath: Secondary | ICD-10-CM | POA: Diagnosis not present

## 2022-11-30 DIAGNOSIS — Z801 Family history of malignant neoplasm of trachea, bronchus and lung: Secondary | ICD-10-CM | POA: Insufficient documentation

## 2022-11-30 DIAGNOSIS — Z79899 Other long term (current) drug therapy: Secondary | ICD-10-CM | POA: Insufficient documentation

## 2022-11-30 DIAGNOSIS — Z809 Family history of malignant neoplasm, unspecified: Secondary | ICD-10-CM | POA: Insufficient documentation

## 2022-11-30 DIAGNOSIS — Z8051 Family history of malignant neoplasm of kidney: Secondary | ICD-10-CM | POA: Insufficient documentation

## 2022-11-30 DIAGNOSIS — Z95828 Presence of other vascular implants and grafts: Secondary | ICD-10-CM

## 2022-11-30 LAB — CBC WITH DIFFERENTIAL/PLATELET
Abs Immature Granulocytes: 0.01 10*3/uL (ref 0.00–0.07)
Basophils Absolute: 0 10*3/uL (ref 0.0–0.1)
Basophils Relative: 1 %
Eosinophils Absolute: 0.1 10*3/uL (ref 0.0–0.5)
Eosinophils Relative: 3 %
HCT: 41.6 % (ref 36.0–46.0)
Hemoglobin: 14.1 g/dL (ref 12.0–15.0)
Immature Granulocytes: 0 %
Lymphocytes Relative: 14 %
Lymphs Abs: 0.7 10*3/uL (ref 0.7–4.0)
MCH: 30.4 pg (ref 26.0–34.0)
MCHC: 33.9 g/dL (ref 30.0–36.0)
MCV: 89.7 fL (ref 80.0–100.0)
Monocytes Absolute: 0.3 10*3/uL (ref 0.1–1.0)
Monocytes Relative: 6 %
Neutro Abs: 4 10*3/uL (ref 1.7–7.7)
Neutrophils Relative %: 76 %
Platelets: 178 10*3/uL (ref 150–400)
RBC: 4.64 MIL/uL (ref 3.87–5.11)
RDW: 12.6 % (ref 11.5–15.5)
WBC: 5.2 10*3/uL (ref 4.0–10.5)
nRBC: 0 % (ref 0.0–0.2)

## 2022-11-30 LAB — COMPREHENSIVE METABOLIC PANEL
ALT: 10 U/L (ref 0–44)
AST: 13 U/L — ABNORMAL LOW (ref 15–41)
Albumin: 3.6 g/dL (ref 3.5–5.0)
Alkaline Phosphatase: 63 U/L (ref 38–126)
Anion gap: 7 (ref 5–15)
BUN: 16 mg/dL (ref 8–23)
CO2: 24 mmol/L (ref 22–32)
Calcium: 9.2 mg/dL (ref 8.9–10.3)
Chloride: 106 mmol/L (ref 98–111)
Creatinine, Ser: 0.83 mg/dL (ref 0.44–1.00)
GFR, Estimated: >60 mL/min (ref >60)
Glucose, Bld: 111 mg/dL — ABNORMAL HIGH (ref 70–99)
Potassium: 4.1 mmol/L (ref 3.5–5.1)
Sodium: 137 mmol/L (ref 135–145)
Total Bilirubin: 1.8 mg/dL — ABNORMAL HIGH (ref 0.3–1.2)
Total Protein: 6.9 g/dL (ref 6.5–8.1)

## 2022-11-30 LAB — TSH: TSH: 4.693 u[IU]/mL — ABNORMAL HIGH (ref 0.350–4.500)

## 2022-11-30 LAB — MAGNESIUM: Magnesium: 2.2 mg/dL (ref 1.7–2.4)

## 2022-11-30 MED ORDER — SODIUM CHLORIDE 0.9% FLUSH
10.0000 mL | Freq: Once | INTRAVENOUS | Status: AC
  Administered 2022-11-30: 10 mL via INTRAVENOUS

## 2022-11-30 MED ORDER — SODIUM CHLORIDE 0.9% FLUSH
10.0000 mL | INTRAVENOUS | Status: DC | PRN
  Administered 2022-11-30: 10 mL

## 2022-11-30 MED ORDER — HEPARIN SOD (PORK) LOCK FLUSH 100 UNIT/ML IV SOLN
500.0000 [IU] | Freq: Once | INTRAVENOUS | Status: AC | PRN
  Administered 2022-11-30: 500 [IU]

## 2022-11-30 MED ORDER — SODIUM CHLORIDE 0.9 % IV SOLN: Freq: Once | INTRAVENOUS | Status: AC

## 2022-11-30 MED ORDER — SODIUM CHLORIDE 0.9 % IV SOLN
10.0000 mg/kg | Freq: Once | INTRAVENOUS | Status: AC
  Administered 2022-11-30: 860 mg via INTRAVENOUS
  Filled 2022-11-30: qty 10

## 2022-11-30 NOTE — Progress Notes (Signed)
Total bilirubin 1.8 today.  Ok to treat verbal order Dr. Ellin Saba.

## 2022-11-30 NOTE — Patient Instructions (Signed)
[facility]  Discharge Instructions: Thank you for choosing  Cancer Center to provide your oncology and hematology care.  If you have a lab appointment with the Cancer Center - please note that after April 8th, 2024, all labs will be drawn in the cancer center.  You do not have to check in or register with the main entrance as you have in the past but will complete your check-in in the cancer center.  Wear comfortable clothing and clothing appropriate for easy access to any Portacath or PICC line.   We strive to give you quality time with your provider. You may need to reschedule your appointment if you arrive late (15 or more minutes).  Arriving late affects you and other patients whose appointments are after yours.  Also, if you miss three or more appointments without notifying the office, you may be dismissed from the clinic at the provider's discretion.      For prescription refill requests, have your pharmacy contact our office and allow 72 hours for refills to be completed.    Today you received the following chemotherapy and/or immunotherapy agents Imfinzi.  Durvalumab Injection What is this medication? DURVALUMAB (dur VAL ue mab) treats some types of cancer. It works by helping your immune system slow or stop the spread of cancer cells. It is a monoclonal antibody. This medicine may be used for other purposes; ask your health care provider or pharmacist if you have questions. COMMON BRAND NAME(S): IMFINZI What should I tell my care team before I take this medication? They need to know if you have any of these conditions: Allogeneic stem cell transplant (uses someone else's stem cells) Autoimmune diseases, such as Crohn disease, ulcerative colitis, lupus History of chest radiation Nervous system problems, such as Guillain-Barre syndrome, myasthenia gravis Organ transplant An unusual or allergic reaction to durvalumab, other medications, foods, dyes, or  preservatives Pregnant or trying to get pregnant Breast-feeding How should I use this medication? This medication is infused into a vein. It is given by your care team in a hospital or clinic setting. A special MedGuide will be given to you before each treatment. Be sure to read this information carefully each time. Talk to your care team about the use of this medication in children. Special care may be needed. Overdosage: If you think you have taken too much of this medicine contact a poison control center or emergency room at once. NOTE: This medicine is only for you. Do not share this medicine with others. What if I miss a dose? Keep appointments for follow-up doses. It is important not to miss your dose. Call your care team if you are unable to keep an appointment. What may interact with this medication? Interactions have not been studied. This list may not describe all possible interactions. Give your health care provider a list of all the medicines, herbs, non-prescription drugs, or dietary supplements you use. Also tell them if you smoke, drink alcohol, or use illegal drugs. Some items may interact with your medicine. What should I watch for while using this medication? Your condition will be monitored carefully while you are receiving this medication. You may need blood work while taking this medication. This medication may cause serious skin reactions. They can happen weeks to months after starting the medication. Contact your care team right away if you notice fevers or flu-like symptoms with a rash. The rash may be red or purple and then turn into blisters or peeling of   the skin. You may also notice a red rash with swelling of the face, lips, or lymph nodes in your neck or under your arms. Tell your care team right away if you have any change in your eyesight. Talk to your care team if you may be pregnant. Serious birth defects can occur if you take this medication during pregnancy and  for 3 months after the last dose. You will need a negative pregnancy test before starting this medication. Contraception is recommended while taking this medication and for 3 months after the last dose. Your care team can help you find the option that works for you. Do not breastfeed while taking this medication and for 3 months after the last dose. What side effects may I notice from receiving this medication? Side effects that you should report to your care team as soon as possible: Allergic reactions--skin rash, itching, hives, swelling of the face, lips, tongue, or throat Dry cough, shortness of breath or trouble breathing Eye pain, redness, irritation, or discharge with blurry or decreased vision Heart muscle inflammation--unusual weakness or fatigue, shortness of breath, chest pain, fast or irregular heartbeat, dizziness, swelling of the ankles, feet, or hands Hormone gland problems--headache, sensitivity to light, unusual weakness or fatigue, dizziness, fast or irregular heartbeat, increased sensitivity to cold or heat, excessive sweating, constipation, hair loss, increased thirst or amount of urine, tremors or shaking, irritability Infusion reactions--chest pain, shortness of breath or trouble breathing, feeling faint or lightheaded Kidney injury (glomerulonephritis)--decrease in the amount of urine, red or dark Shandra Szymborski urine, foamy or bubbly urine, swelling of the ankles, hands, or feet Liver injury--right upper belly pain, loss of appetite, nausea, light-colored stool, dark yellow or Analyn Matusek urine, yellowing skin or eyes, unusual weakness or fatigue Pain, tingling, or numbness in the hands or feet, muscle weakness, change in vision, confusion or trouble speaking, loss of balance or coordination, trouble walking, seizures Rash, fever, and swollen lymph nodes Redness, blistering, peeling, or loosening of the skin, including inside the mouth Sudden or severe stomach pain, bloody diarrhea, fever,  nausea, vomiting Side effects that usually do not require medical attention (report these to your care team if they continue or are bothersome): Bone, joint, or muscle pain Diarrhea Fatigue Loss of appetite Nausea Skin rash This list may not describe all possible side effects. Call your doctor for medical advice about side effects. You may report side effects to FDA at 1-800-FDA-1088. Where should I keep my medication? This medication is given in a hospital or clinic. It will not be stored at home. NOTE: This sheet is a summary. It may not cover all possible information. If you have questions about this medicine, talk to your doctor, pharmacist, or health care provider.  2023 Elsevier/Gold Standard (2021-11-12 00:00:00)        To help prevent nausea and vomiting after your treatment, we encourage you to take your nausea medication as directed.  BELOW ARE SYMPTOMS THAT SHOULD BE REPORTED IMMEDIATELY: *FEVER GREATER THAN 100.4 F (38 C) OR HIGHER *CHILLS OR SWEATING *NAUSEA AND VOMITING THAT IS NOT CONTROLLED WITH YOUR NAUSEA MEDICATION *UNUSUAL SHORTNESS OF BREATH *UNUSUAL BRUISING OR BLEEDING *URINARY PROBLEMS (pain or burning when urinating, or frequent urination) *BOWEL PROBLEMS (unusual diarrhea, constipation, pain near the anus) TENDERNESS IN MOUTH AND THROAT WITH OR WITHOUT PRESENCE OF ULCERS (sore throat, sores in mouth, or a toothache) UNUSUAL RASH, SWELLING OR PAIN  UNUSUAL VAGINAL DISCHARGE OR ITCHING   Items with * indicate a potential emergency and should be   followed up as soon as possible or go to the Emergency Department if any problems should occur.  Please show the CHEMOTHERAPY ALERT CARD or IMMUNOTHERAPY ALERT CARD at check-in to the Emergency Department and triage nurse.  Should you have questions after your visit or need to cancel or reschedule your appointment, please contact [facility] [phone]  and follow the prompts.  Office hours  are 8:00 a.m. to 4:30 p.m. Monday - Friday. Please note that voicemails left after 4:00 p.m. may not be returned until the following business day.  We are closed weekends and major holidays. You have access to a nurse at all times for urgent questions. Please call the main number to the clinic [phone] and follow the prompts.  For any non-urgent questions, you may also contact your provider using MyChart. We now offer e-Visits for anyone 18 and older to request care online for non-urgent symptoms. For details visit [email or web address].   Also download the MyChart app! Go to the app store, search "MyChart", open the app, select [facility], and log in with your MyChart username and password.   

## 2022-11-30 NOTE — Progress Notes (Signed)
Patient presents today for Imfinzi infusion.  Patient is in satisfactory condition with no new complaints voiced.  Vital signs are stable.  Labs reviewed.  Bilirubin today is 1.8.  MD made aware. We will proceed with treatment per MD orders.    Patient tolerated treatment well with no complaints voiced.  Patient left ambulatory in stable condition.  Vital signs stable at discharge.  Follow up as scheduled.

## 2022-12-09 ENCOUNTER — Other Ambulatory Visit: Payer: Self-pay

## 2022-12-09 ENCOUNTER — Other Ambulatory Visit: Payer: Self-pay | Admitting: *Deleted

## 2022-12-09 MED ORDER — HYDROCODONE-ACETAMINOPHEN 7.5-325 MG/15ML PO SOLN: 15.0000 mL | Freq: Four times a day (QID) | ORAL | 0 refills | Status: DC | PRN

## 2022-12-13 ENCOUNTER — Ambulatory Visit (HOSPITAL_COMMUNITY): Payer: Medicare HMO | Admitting: Speech Pathology

## 2022-12-15 ENCOUNTER — Inpatient Hospital Stay (HOSPITAL_BASED_OUTPATIENT_CLINIC_OR_DEPARTMENT_OTHER): Payer: Medicare HMO | Admitting: Hematology

## 2022-12-15 ENCOUNTER — Inpatient Hospital Stay: Payer: Medicare HMO

## 2022-12-15 DIAGNOSIS — C3490 Malignant neoplasm of unspecified part of unspecified bronchus or lung: Secondary | ICD-10-CM

## 2022-12-15 DIAGNOSIS — E876 Hypokalemia: Secondary | ICD-10-CM | POA: Diagnosis not present

## 2022-12-15 DIAGNOSIS — C3432 Malignant neoplasm of lower lobe, left bronchus or lung: Secondary | ICD-10-CM | POA: Diagnosis not present

## 2022-12-15 DIAGNOSIS — Z801 Family history of malignant neoplasm of trachea, bronchus and lung: Secondary | ICD-10-CM | POA: Diagnosis not present

## 2022-12-15 DIAGNOSIS — Z818 Family history of other mental and behavioral disorders: Secondary | ICD-10-CM | POA: Diagnosis not present

## 2022-12-15 DIAGNOSIS — Z79620 Long term (current) use of immunosuppressive biologic: Secondary | ICD-10-CM | POA: Diagnosis not present

## 2022-12-15 DIAGNOSIS — C321 Malignant neoplasm of supraglottis: Secondary | ICD-10-CM | POA: Diagnosis not present

## 2022-12-15 DIAGNOSIS — Z79899 Other long term (current) drug therapy: Secondary | ICD-10-CM | POA: Diagnosis not present

## 2022-12-15 DIAGNOSIS — R070 Pain in throat: Secondary | ICD-10-CM | POA: Diagnosis not present

## 2022-12-15 DIAGNOSIS — Z923 Personal history of irradiation: Secondary | ICD-10-CM | POA: Diagnosis not present

## 2022-12-15 DIAGNOSIS — Z808 Family history of malignant neoplasm of other organs or systems: Secondary | ICD-10-CM | POA: Diagnosis not present

## 2022-12-15 DIAGNOSIS — R42 Dizziness and giddiness: Secondary | ICD-10-CM | POA: Diagnosis not present

## 2022-12-15 DIAGNOSIS — Z8041 Family history of malignant neoplasm of ovary: Secondary | ICD-10-CM | POA: Diagnosis not present

## 2022-12-15 DIAGNOSIS — Z833 Family history of diabetes mellitus: Secondary | ICD-10-CM | POA: Diagnosis not present

## 2022-12-15 DIAGNOSIS — R197 Diarrhea, unspecified: Secondary | ICD-10-CM | POA: Diagnosis not present

## 2022-12-15 DIAGNOSIS — Z9221 Personal history of antineoplastic chemotherapy: Secondary | ICD-10-CM | POA: Diagnosis not present

## 2022-12-15 DIAGNOSIS — E039 Hypothyroidism, unspecified: Secondary | ICD-10-CM | POA: Diagnosis not present

## 2022-12-15 DIAGNOSIS — R072 Precordial pain: Secondary | ICD-10-CM | POA: Diagnosis not present

## 2022-12-15 DIAGNOSIS — Z800 Family history of malignant neoplasm of digestive organs: Secondary | ICD-10-CM | POA: Diagnosis not present

## 2022-12-15 DIAGNOSIS — Z87891 Personal history of nicotine dependence: Secondary | ICD-10-CM | POA: Diagnosis not present

## 2022-12-15 DIAGNOSIS — R0602 Shortness of breath: Secondary | ICD-10-CM | POA: Diagnosis not present

## 2022-12-15 DIAGNOSIS — Z825 Family history of asthma and other chronic lower respiratory diseases: Secondary | ICD-10-CM | POA: Diagnosis not present

## 2022-12-15 DIAGNOSIS — Z5112 Encounter for antineoplastic immunotherapy: Secondary | ICD-10-CM | POA: Diagnosis not present

## 2022-12-15 DIAGNOSIS — R059 Cough, unspecified: Secondary | ICD-10-CM | POA: Diagnosis not present

## 2022-12-15 DIAGNOSIS — G479 Sleep disorder, unspecified: Secondary | ICD-10-CM | POA: Diagnosis not present

## 2022-12-15 LAB — COMPREHENSIVE METABOLIC PANEL
ALT: 10 U/L (ref 0–44)
AST: 13 U/L — ABNORMAL LOW (ref 15–41)
Albumin: 3.6 g/dL (ref 3.5–5.0)
Alkaline Phosphatase: 61 U/L (ref 38–126)
Anion gap: 7 (ref 5–15)
BUN: 16 mg/dL (ref 8–23)
CO2: 23 mmol/L (ref 22–32)
Calcium: 9.1 mg/dL (ref 8.9–10.3)
Chloride: 106 mmol/L (ref 98–111)
Creatinine, Ser: 0.82 mg/dL (ref 0.44–1.00)
GFR, Estimated: >60 mL/min (ref >60)
Glucose, Bld: 117 mg/dL — ABNORMAL HIGH (ref 70–99)
Potassium: 4.8 mmol/L (ref 3.5–5.1)
Sodium: 136 mmol/L (ref 135–145)
Total Bilirubin: 1.7 mg/dL — ABNORMAL HIGH (ref 0.3–1.2)
Total Protein: 6.7 g/dL (ref 6.5–8.1)

## 2022-12-15 LAB — CBC WITH DIFFERENTIAL/PLATELET
Abs Immature Granulocytes: 0.01 10*3/uL (ref 0.00–0.07)
Basophils Absolute: 0 10*3/uL (ref 0.0–0.1)
Basophils Relative: 1 %
Eosinophils Absolute: 0.2 10*3/uL (ref 0.0–0.5)
Eosinophils Relative: 4 %
HCT: 41.3 % (ref 36.0–46.0)
Hemoglobin: 13.8 g/dL (ref 12.0–15.0)
Immature Granulocytes: 0 %
Lymphocytes Relative: 15 %
Lymphs Abs: 0.7 10*3/uL (ref 0.7–4.0)
MCH: 30 pg (ref 26.0–34.0)
MCHC: 33.4 g/dL (ref 30.0–36.0)
MCV: 89.8 fL (ref 80.0–100.0)
Monocytes Absolute: 0.3 10*3/uL (ref 0.1–1.0)
Monocytes Relative: 6 %
Neutro Abs: 3.4 10*3/uL (ref 1.7–7.7)
Neutrophils Relative %: 74 %
Platelets: 175 10*3/uL (ref 150–400)
RBC: 4.6 MIL/uL (ref 3.87–5.11)
RDW: 12.8 % (ref 11.5–15.5)
WBC: 4.5 10*3/uL (ref 4.0–10.5)
nRBC: 0 % (ref 0.0–0.2)

## 2022-12-15 LAB — MAGNESIUM: Magnesium: 2.1 mg/dL (ref 1.7–2.4)

## 2022-12-15 LAB — TSH: TSH: 2.731 u[IU]/mL (ref 0.350–4.500)

## 2022-12-15 MED ORDER — AMOXICILLIN-POT CLAVULANATE 875-125 MG PO TABS: 1.0000 | ORAL_TABLET | Freq: Two times a day (BID) | ORAL | 0 refills | Status: DC

## 2022-12-15 MED ORDER — HEPARIN SOD (PORK) LOCK FLUSH 100 UNIT/ML IV SOLN
500.0000 [IU] | Freq: Once | INTRAVENOUS | Status: AC
  Administered 2022-12-15: 500 [IU] via INTRAVENOUS

## 2022-12-15 MED ORDER — SODIUM CHLORIDE 0.9% FLUSH
10.0000 mL | INTRAVENOUS | Status: DC | PRN
  Administered 2022-12-15: 10 mL

## 2022-12-15 MED ORDER — SODIUM CHLORIDE 0.9% FLUSH
10.0000 mL | Freq: Once | INTRAVENOUS | Status: AC
  Administered 2022-12-15: 10 mL

## 2022-12-15 NOTE — Progress Notes (Signed)
Pt presents today for Imfinzi per provider's order. We will hold treatment today due to upper respiratory infection. Pt will be put on antibiotics by Dr.K. Pt will return in two weeks for treatment per Dr.K.

## 2022-12-15 NOTE — Patient Instructions (Addendum)
[facility] Discharge Instructions   You were seen and examined today by Dr. Ellin Saba.  He reviewed the results of your lab work which are normal/stable.   We will proceed with your treatment today.   Try taking Mucinex over the counter for the congestion.   Return as scheduled.    Thank you for choosing [facility] to provide your oncology and hematology care.  To afford each patient quality time with our provider, please arrive at least 15 minutes before your scheduled appointment time.   If you have a lab appointment with the Cancer Center please come in thru the Main Entrance and check in at the main information desk.  You need to re-schedule your appointment should you arrive 10 or more minutes late.  We strive to give you quality time with our providers, and arriving late affects you and other patients whose appointments are after yours.  Also, if you no show three or more times for appointments you may be dismissed from the clinic at the providers discretion.     Again, thank you for choosing [facility].  Our hope is that these requests will decrease the amount of time that you wait before being seen by our physicians.       _____________________________________________________________  Should you have questions after your visit to [facility], please contact our office at [phone] and follow the prompts.  Our office hours are 8:00 a.m. and 4:30 p.m. Monday - Friday.  Please note that voicemails left after 4:00 p.m. may not be returned until the following business day.  We are closed weekends and major holidays.  You do have access to a nurse 24-7, just call the main number to the clinic [phone] and do not press any options, hold on the line and a nurse will answer the phone.    For prescription refill requests, have your pharmacy contact our office and allow 72 hours.     Due to Covid, you will need to wear a mask upon entering the hospital. If you do not have a mask, a mask will be given to you at the Main Entrance upon arrival. For doctor visits, patients may have 1 support person age 64 or older with them. For treatment visits, patients can not have anyone with them due to social distancing guidelines and our immunocompromised population.

## 2022-12-15 NOTE — Progress Notes (Signed)
[facility] [address], [place] [address]    Clinic Day:  12/15/2022  Referring physician: Benita Stabile, MD  Patient Care Team: Benita Stabile, MD as PCP - General (Internal Medicine) Laqueta Linden, MD (Inactive) as PCP - Cardiology (Cardiology) Harriette Bouillon, MD as Consulting Physician (General Surgery) Lurline Hare, MD as Consulting Physician (Radiation Oncology) Salomon Fick, NP as Nurse Practitioner (Hematology and Oncology) West Bali, MD (Inactive) as Consulting Physician (Gastroenterology) Doreatha Massed, MD as Medical Oncologist (Medical Oncology) Malmfelt, Lise Auer, RN as Oncology Nurse Navigator Christia Reading, MD as Consulting Physician (Otolaryngology) Lonie Peak, MD as Consulting Physician (Radiation Oncology) [name], MD as Consulting Physician (Hematology and Oncology) Therese Sarah, RN as Oncology Nurse Navigator (Medical Oncology)   ASSESSMENT & PLAN:   Assessment: 1. Stage III squamous cell cancer: - LLL lung mass FNA (08/23/2021): Malignant cells consistent with squamous cell carcinoma. - Lymph node 4R and 7: Malignant cells consistent with NSCLC. - Chemoradiation therapy with weekly carboplatin and paclitaxel from 11/01/2021, week for chemotherapy on 11/23/2021.  XRT completed on 12/16/2021. - She missed further doses of chemotherapy secondary to thrombocytopenia. - PET scan (01/27/2022): Interval response to therapy with decreasing size of mediastinal lymph nodes.  No new disease. - Consolidation durvalumab started on 02/01/2022   2. Social/family history: - Seen today with her Sister [name].  Patient lives by herself.  Quit smoking on 02/22/2021.  Smoked 2 packs/day for 40 years. - Mother died of ovarian cancer.  Brother died of colon cancer at age 75.  Sister had melanoma.  Paternal uncles had stomach cancer and lung cancer.  Paternal aunt had thyroid cancer.  Another paternal aunt had  pancreatic cancer.   3.  Supraglottic squamous cell carcinoma (T1N1): - Definitive radiation from 08/16/2021 through 09/13/2021    Plan: Stage III squamous cell lung cancer: - CT chest on 10/26/2022: Decrease in subpleural consolidation in the medial segment of the right medial lobe, now somewhat nodular in appearance.  Small pericardial effusion is stable.  Fluid in the endometrial canal.  No pathologically enlarged lymph nodes. - She reports that her breathing has been worse in the last 3 to 5 days.  Reports congestion at nighttime.  Reports nose is congested and has headaches in the front and back. - She also reports greenish expectoration.  She is using her 2 different inhalers more frequently. - Reviewed labs today: Normal LFTs.  Creatinine is normal.  CBC grossly normal. - Will hold her treatment today.  We will give her Augmentin 875 mg twice daily for 7 days and told her to start Mucinex. - We will reschedule her treatment to the week of 12/26/2022.  Continue treatment every 2 weeks.  RTC 6 weeks for follow-up with repeat CT of the chest.  2.  Supraglottic squamous cell carcinoma (T1N1): - CT soft tissue neck on 08/03/2022 with no evidence of recurrence. - Continue follow-up with Dr. Jenne Pane.  Speech therapy referral was made.  3.  Throat pain and sternal chest pain: - Continue hydrocodone liquid 15 mL every 6 hours.  4.  Hypokalemia: - Continue potassium 20 mEq daily.  Potassium is 4.8.  5.  Hypothyroidism: - Continue Synthroid 100 mcg daily.  TSH improved to 2.7 today.    Orders Placed This Encounter  Procedures   CT Chest W Contrast    Standing Status:   Future    Standing Expiration Date:   12/15/2023    Order  Specific Question:   If indicated for the ordered procedure, I authorize the administration of contrast media per Radiology protocol    Answer:   Yes    Order Specific Question:   Does the patient have a contrast media/X-ray dye allergy?    Answer:   No    Order Specific  Question:   Preferred imaging location?    Answer:   [facility]   Magnesium    Standing Status:   Future    Standing Expiration Date:   02/13/2024   CBC with Differential    Standing Status:   Future    Standing Expiration Date:   02/13/2024   Comprehensive metabolic panel    Standing Status:   Future    Standing Expiration Date:   02/13/2024   T4    Standing Status:   Future    Standing Expiration Date:   02/13/2024   TSH    Standing Status:   Future    Standing Expiration Date:   02/13/2024      I,[name],acting as a scribe for Doreatha Massed, MD.,have documented all relevant documentation on the behalf of Doreatha Massed, MD,as directed by  Doreatha Massed, MD while in the presence of Doreatha Massed, MD.   I, Doreatha Massed MD, have reviewed the above documentation for accuracy and completeness, and I agree with the above.   Doreatha Massed, MD   5/23/20245:27 PM  CHIEF COMPLAINT:   Diagnosis: stage III squamous cell lung cancer    Cancer Staging  Carcinoma of upper-outer quadrant of left female breast [facility]) Staging form: Breast, AJCC 7th Edition - Clinical stage from 07/01/2014: Stage IIA (T2, N0, M0) - Unsigned - Pathologic stage from 08/20/2014: Stage IIB (T2, N1a, cM0) - Unsigned  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HCC) Staging form: Larynx - Supraglottis, AJCC 8th Edition - Clinical stage from 07/30/2021: cT1, cN1 - Unsigned  Squamous cell lung cancer (HCC) Staging form: Lung, AJCC 8th Edition - Clinical stage from 09/08/2021: Stage IIIB (cT1b, cN3, cM0) - Signed by Lonie Peak, MD on 09/08/2021    Prior Therapy: none  Current Therapy:  Durvalumab q14d    HISTORY OF PRESENT ILLNESS:   Oncology History  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    06/24/2014 Breast US   Left breast: ill-defined predominantly hypoechoic area with some increased echogenicity in the 2 o'clock position of the left breast, 7 cm from the nipple. 2.6 x 2.3 x 1.8 cm in maximum dimensions.   07/01/2014 Initial Biopsy   Left breast core needle bx: Invasive mammary carcinoma with lobular features, ER+ (100%), PR+ (91%), HER2/neu negative (ratio 1.09), Ki-67 32%. E-cadherin strongly positive, diagnostic for IDC    07/01/2014 Clinical Stage   Stage IIA: T2 N0   08/20/2014 Definitive Surgery   Left breast seed localized lumpectomy/SLNB: IDC, +LVI, DCIS, 1 LN removed and positive for metastatic carcinoma. Grade 2. HER2/neu repeated and negative (ratio 0.69-1.9).    08/20/2014 Pathologic Stage   Stage IIB: pT2 pN1a pMx   09/17/2014 Surgery   Port-a cath placement and re-excision   10/02/2014 - 01/15/2015 Chemotherapy   CMF x 6 cycles.  patient refused Taxane-containing chemotherapy.   03/07/2015 Procedure   Comp Cancer Panel reveals no  variant at APC, ATM, AXIN2, BARD1, BMPR1A, BRCA1, BRCA2, BRIP1, CDH1, CDK4, CDKN2A, CHEK2, FANCC, MLH1, MSH2, MSH6, MUTYH, NBN, PALB2, PMS2, POLD1, POLE, PTEN, RAD51C, RAD51D, SMAD4, STK11, TP53, VHL, and XRCC2.    04/06/2015 - 05/21/2015 Radiation Therapy   Adjuvant RT Michell Heinrich): Left breast/ 45 Gy over 25 fractions.   Left supraclavicular fossa and axilla/ 45 Gy over 25 fractions. Left breast boost/ 16 Gy over 8 fractions. Total dose: 61 Gy   06/04/2015 - 08/07/2015 Anti-estrogen oral therapy   Exemestane 25 mg daily.  Planned duration of therapy 5 years.    07/23/2015 Survivorship   Survivorship care plan completed and mailed to patient in lieu of in person visit   08/06/2015 Adverse Reaction   Severe hot flashes and mood swings   08/08/2015 - 12/07/2015 Anti-estrogen oral therapy   Arimidex   12/04/2015 Adverse Reaction   Worsening depression, patient  stopped Arimidex   12/07/2015 -  Anti-estrogen oral therapy   Tamoxifen   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HCC)  07/30/2021 Initial Diagnosis   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HCC) P 16 positive cT1N1   08/16/2021 - 09/13/2021 Radiation Therapy   Completed definitive radiation   09/30/2021 Pathology Results   [facility] Pathology PDL 1 29%   Squamous cell lung cancer (HCC)  07/23/2021 Imaging    PET/CT  demonstrated small right glottic lesion is mildly hypermetabolic and consistent with no neoplasm.  PET also demonstrated borderline right level 2 lymph node left supraclavicular, mediastinal and hilar lymphadenopathy and hypermetabolic left lower lobe pulmonary nodule all suspicious of metastatic focus.  No abdominal pelvic metastatic disease or osseous metastatic disease was appreciated.   08/31/2021 Initial Diagnosis   Squamous cell carcinoma of lung (HCC)   09/08/2021 Cancer Staging   Staging form: Lung, AJCC 8th Edition - Clinical stage from 09/08/2021: Stage IIIB (cT1b, cN3, cM0) - Signed by Lonie Peak, MD on 09/08/2021 Stage prefix: Initial diagnosis   09/30/2021 Pathology Results   [facility] Pathology PDL 1 29%   11/01/2021 - 11/23/2021 Chemotherapy   Patient is on Treatment Plan : LUNG Carboplatin / Paclitaxel + XRT q7d     02/01/2022 - 03/15/2022 Chemotherapy   Patient is on Treatment Plan : LUNG Durvalumab q14d     02/01/2022 -  Chemotherapy   Patient is on Treatment Plan : LUNG Durvalumab (10) q14d        INTERVAL HISTORY:   Regina Mcmahon is a 72 y.o. female presenting to clinic today for follow up of stage III squamous cell lung cancer. She was last seen by me on 11/02/22.  Today, she states that she is doing well overall. Her appetite level is at 100%. Her energy level is at 50%.  PAST MEDICAL HISTORY:   Past Medical History: Past Medical History:  Diagnosis Date   Arthritis    Asthma    Asymptomatic varicose veins    Breast cancer (HCC) 06/2014   left    Cancer (HCC)    Supra Glottis   Chronic airway obstruction (HCC)    Complication of anesthesia 2016   difficulty waking up, Oxygen dropped low.   COPD (chronic obstructive pulmonary disease) (HCC)    Depression    Dyspnea    GERD (gastroesophageal reflux disease)    Hemorrhage rectum    and anus.   Hypertension    Insomnia    Other symptoms involving digestive system(787.99)    Pain    Hx.pain in joint involving pelvic region and thigh; pain in limb  Palpitations    Panic attacks    Personal history of chemotherapy 2016   Personal history of radiation therapy 2016   Pneumonia    Pre-diabetes    Prediabetes    Sinusitis    Symptomatic menopausal or female climacteric states    Varicose veins of both lower extremities with pain     Surgical History: Past Surgical History:  Procedure Laterality Date   BREAST BIOPSY Left 10/2012   BREAST BIOPSY Left 07/01/2014   malignant   BREAST LUMPECTOMY Left 08/20/2014   BRONCHIAL BIOPSY  08/23/2021   Procedure: BRONCHIAL BIOPSIES;  Surgeon: Leslye Peer, MD;  Location: [facility] ENDOSCOPY;  Service: Pulmonary;;   BRONCHIAL BRUSHINGS  08/23/2021   Procedure: BRONCHIAL BRUSHINGS;  Surgeon: Leslye Peer, MD;  Location: [facility] ENDOSCOPY;  Service: Pulmonary;;   BRONCHIAL NEEDLE ASPIRATION BIOPSY  08/23/2021   Procedure: BRONCHIAL NEEDLE ASPIRATION BIOPSIES;  Surgeon: Leslye Peer, MD;  Location: [facility] ENDOSCOPY;  Service: Pulmonary;;   BRONCHIAL WASHINGS  08/23/2021   Procedure: BRONCHIAL WASHINGS;  Surgeon: Leslye Peer, MD;  Location: [facility] ENDOSCOPY;  Service: Pulmonary;;   CESAREAN SECTION     CHOLECYSTECTOMY  1985   COLONOSCOPY  2002   Dr. Cleotis Nipper: internal hemorrhoids, one small rectal polyp, adenomatous path    COLONOSCOPY N/A 11/23/2015   Procedure: COLONOSCOPY;  Surgeon: West Bali, MD;  Location: [facility];  Service: Endoscopy;  Laterality: N/A;  1100 - moved to 10:45 - office to notify   ESOPHAGOGASTRODUODENOSCOPY N/A  11/23/2015   Procedure: ESOPHAGOGASTRODUODENOSCOPY (EGD);  Surgeon: West Bali, MD;  Location: [facility];  Service: Endoscopy;  Laterality: N/A;   IR IMAGING GUIDED PORT INSERTION  11/08/2021   PORT-A-CATH REMOVAL  02/2015   PORTACATH PLACEMENT Right 09/17/2014   Procedure: INSERTION PORT-A-CATH WITH ULTRASOUND;  Surgeon: Harriette Bouillon, MD;  Location: [facility];  Service: General;  Laterality: Right;  IJ   RADIOACTIVE SEED GUIDED PARTIAL MASTECTOMY WITH AXILLARY SENTINEL LYMPH NODE BIOPSY Left 08/20/2014   Procedure: LEFT BREAST LUMPECTOMY WITH RADIOACTIVE SEED LOCALIZATION AND SENTINEL LYMPH NODE MAPPING;  Surgeon: Harriette Bouillon, MD;  Location: [facility];  Service: General;  Laterality: Left;   RE-EXCISION OF BREAST LUMPECTOMY Left 09/17/2014   Procedure: RE-EXCISION OF BREAST LUMPECTOMY;  Surgeon: Harriette Bouillon, MD;  Location: [facility];  Service: General;  Laterality: Left;   TOTAL HIP ARTHROPLASTY Right 02/22/2021   Procedure: RIGHT TOTAL HIP ARTHROPLASTY ANTERIOR APPROACH;  Surgeon: Eldred Manges, MD;  Location: [facility] OR;  Service: Orthopedics;  Laterality: Right;   TUBAL LIGATION     VIDEO BRONCHOSCOPY WITH ENDOBRONCHIAL ULTRASOUND N/A 08/23/2021   Procedure: VIDEO BRONCHOSCOPY WITH ENDOBRONCHIAL ULTRASOUND;  Surgeon: Leslye Peer, MD;  Location: [facility];  Service: Pulmonary;  Laterality: N/A;   VIDEO BRONCHOSCOPY WITH RADIAL ENDOBRONCHIAL ULTRASOUND  08/23/2021   Procedure: VIDEO BRONCHOSCOPY WITH RADIAL ENDOBRONCHIAL ULTRASOUND;  Surgeon: Leslye Peer, MD;  Location: [facility];  Service: Pulmonary;;    Social History: Social History   Socioeconomic History   Marital status: Divorced    Spouse name: Not on file   Number of children: 2   Years of education: Not on file   Highest education level: Not on file  Occupational History   Not on file  Tobacco Use   Smoking status: Former    Packs/day: 1.00    Years: 42.00     Additional pack years: 0.00    Total pack years: 42.00  Types: Cigarettes    Quit date: 02/22/2021    Years since quitting: 1.8   Smokeless tobacco: Never  Vaping Use   Vaping Use: Never used  Substance and Sexual Activity   Alcohol use: No    Alcohol/week: 0.0 standard drinks of alcohol   Drug use: No   Sexual activity: Never    Birth control/protection: None  Other Topics Concern   Not on file  Social History Narrative   Not on file   Social Determinants of Health   Financial Resource Strain: Not on file  Food Insecurity: Not on file  Transportation Needs: Not on file  Physical Activity: Not on file  Stress: Not on file  Social Connections: Unknown (08/05/2021)   Social Connection and Isolation Panel [NHANES]    Frequency of Communication with Friends and Family: More than three times a week    Frequency of Social Gatherings with Friends and Family: More than three times a week    Attends Religious Services: Not on Marketing executive or Organizations: Not on file    Attends Banker Meetings: Not on file    Marital Status: Not on file  Intimate Partner Violence: Not on file    Family History: Family History  Problem Relation Age of Onset   Diabetes Mother    Ovarian cancer Mother 13   Other Father        died in MVA at age 36   Other Sister        mitral valve disorder   Melanoma Sister 41       removed from back of leg   Colon cancer Brother        early 70s, succumbed to disease   Cancer Paternal Aunt        leukemia, bone, or lung cancer   Alzheimer's disease Maternal Grandmother    Heart attack Maternal Grandfather    Heart attack Paternal Grandmother    COPD Paternal Grandfather    Emphysema Paternal Grandfather    Congestive Heart Failure Paternal Aunt    Stomach cancer Paternal Uncle    Kidney cancer Maternal Uncle    Cancer Cousin        dx. teens/dx. 40s   Cancer Cousin    Colon cancer Cousin     Current  Medications:  Current Outpatient Medications:    albuterol (VENTOLIN HFA) 108 (90 Base) MCG/ACT inhaler, Inhale 2 puffs into the lungs every 4 (four) hours as needed., Disp: , Rfl:    ALPRAZolam (XANAX) 0.5 MG tablet, Take 0.5 mg by mouth 3 (three) times daily., Disp: , Rfl:    amoxicillin-clavulanate (AUGMENTIN) 875-125 MG tablet, Take 1 tablet by mouth 2 (two) times daily., Disp: 14 tablet, Rfl: 0   ANORO ELLIPTA 62.5-25 MCG/INH AEPB, Inhale 1 puff into the lungs daily., Disp: , Rfl:    calcium carbonate (TUMS - DOSED IN MG ELEMENTAL CALCIUM) 500 MG chewable tablet, Chew 2 tablets by mouth 3 (three) times daily as needed for indigestion or heartburn., Disp: , Rfl:    fluconazole (DIFLUCAN) 100 MG tablet, Take 2 tablets today, then 1 tablet daily x 20 more days., Disp: 22 tablet, Rfl: 0   furosemide (LASIX) 20 MG tablet, Take 20 mg by mouth daily., Disp: , Rfl:    hydrochlorothiazide (HYDRODIURIL) 12.5 MG tablet, Take 12.5 mg by mouth daily., Disp: , Rfl:    HYDROcodone-acetaminophen (HYCET) 7.5-325 mg/15 ml solution, Take 15 mLs by mouth every 6 (  six) hours as needed for moderate pain., Disp: 473 mL, Rfl: 0   ibuprofen (ADVIL) 100 MG/5ML suspension, Take 10 mLs (200 mg total) by mouth every 4 (four) hours as needed for moderate pain., Disp: 473 mL, Rfl: 0   levothyroxine (SYNTHROID) 100 MCG tablet, Take 1 tablet (100 mcg total) by mouth daily before breakfast., Disp: 30 tablet, Rfl: 2   lidocaine (XYLOCAINE) 2 % solution, Patient: Mix 1part 2% viscous lidocaine, 1part H20. Swallow 10mL of diluted mixture, before meals and at bedtime, up to QID for sore throat., Disp: 200 mL, Rfl: 3   Menthol, Topical Analgesic, (BIOFREEZE EX), Apply 1 application topically daily as needed (pain)., Disp: , Rfl:    nystatin (MYCOSTATIN) 100000 UNIT/ML suspension, Take 5 mLs (500,000 Units total) by mouth 4 (four) times daily., Disp: 60 mL, Rfl: 0   pantoprazole (PROTONIX) 40 MG tablet, Take 1 tablet by mouth  once daily, Disp: 90 tablet, Rfl: 0   polyethylene glycol (MIRALAX / GLYCOLAX) 17 g packet, Take 17 g by mouth daily., Disp: , Rfl:    potassium chloride (KLOR-CON) 20 MEQ packet, Take 20 mEq by mouth 2 (two) times daily. Dissolve in at least 5 ounces cold water or other beverage and take after meals, Disp: 60 packet, Rfl: 6   sertraline (ZOLOFT) 100 MG tablet, Take 100 mg by mouth daily., Disp: , Rfl:    sucralfate (CARAFATE) 1 g tablet, Dissolve 1 tablet in 10 mL H20 and swallow 30 min prior to meals and bedtime., Disp: 40 tablet, Rfl: 3   Vitamin D, Ergocalciferol, 50000 units CAPS, Take 1 capsule by mouth once a week., Disp: , Rfl:    lidocaine-prilocaine (EMLA) cream, Apply 1 application. topically as needed. (Patient not taking: Reported on 12/15/2022), Disp: 30 g, Rfl: 0   ondansetron (ZOFRAN) 8 MG tablet, Take 1 tablet (8 mg total) by mouth every 8 (eight) hours as needed for nausea or vomiting. (Patient not taking: Reported on 12/15/2022), Disp: 60 tablet, Rfl: 0   prochlorperazine (COMPAZINE) 10 MG tablet, Take 1 tablet (10 mg total) by mouth every 6 (six) hours as needed for nausea or vomiting. (Patient not taking: Reported on 12/15/2022), Disp: 60 tablet, Rfl: 0 No current facility-administered medications for this visit.  Facility-Administered Medications Ordered in Other Visits:    sodium chloride flush (NS) 0.9 % injection 10 mL, 10 mL, Intracatheter, PRN, Doreatha Massed, MD, 10 mL at 12/15/22 1349   Allergies: No Known Allergies  REVIEW OF SYSTEMS:   Review of Systems  Constitutional:  Negative for chills, fatigue and fever.  HENT:   Negative for lump/mass, mouth sores, nosebleeds, sore throat and trouble swallowing.   Eyes:  Negative for eye problems.  Respiratory:  Positive for cough and shortness of breath.   Cardiovascular:  Negative for chest pain, leg swelling and palpitations.  Gastrointestinal:  Positive for diarrhea. Negative for abdominal pain, constipation,  nausea and vomiting.  Genitourinary:  Negative for bladder incontinence, difficulty urinating, dysuria, frequency, hematuria and nocturia.   Musculoskeletal:  Negative for arthralgias, back pain, flank pain, myalgias and neck pain.  Skin:  Negative for itching and rash.  Neurological:  Positive for dizziness. Negative for headaches and numbness.  Hematological:  Does not bruise/bleed easily.  Psychiatric/Behavioral:  Positive for sleep disturbance. Negative for depression and suicidal ideas. The patient is not nervous/anxious.   All other systems reviewed and are negative.    VITALS:   There were no vitals taken for this visit.  Wt  Readings from Last 3 Encounters:  12/15/22 201 lb (91.2 kg)  11/30/22 197 lb 12.8 oz (89.7 kg)  11/16/22 191 lb (86.6 kg)    There is no height or weight on file to calculate BMI.  Performance status (ECOG): 1 - Symptomatic but completely ambulatory  PHYSICAL EXAM:   Physical Exam Vitals and nursing note reviewed. Exam conducted with a chaperone present.  Constitutional:      Appearance: Normal appearance.  Cardiovascular:     Rate and Rhythm: Normal rate and regular rhythm.     Pulses: Normal pulses.     Heart sounds: Normal heart sounds.  Pulmonary:     Effort: Pulmonary effort is normal.     Breath sounds: Normal breath sounds.  Abdominal:     Palpations: Abdomen is soft. There is no hepatomegaly, splenomegaly or mass.     Tenderness: There is no abdominal tenderness.  Musculoskeletal:     Right lower leg: No edema.     Left lower leg: No edema.  Lymphadenopathy:     Cervical: No cervical adenopathy.     Right cervical: No superficial, deep or posterior cervical adenopathy.    Left cervical: No superficial, deep or posterior cervical adenopathy.     Upper Body:     Right upper body: No supraclavicular or axillary adenopathy.     Left upper body: No supraclavicular or axillary adenopathy.  Neurological:     General: No focal deficit  present.     Mental Status: She is alert and oriented to person, place, and time.  Psychiatric:        Mood and Affect: Mood normal.        Behavior: Behavior normal.     LABS:      Latest Ref Rng & Units 12/15/2022   12:16 PM 11/30/2022   12:03 PM 11/16/2022   12:31 PM  CBC  WBC 4.0 - 10.5 K/uL 4.5  5.2  5.6   Hemoglobin 12.0 - 15.0 g/dL 45.4  09.8  11.9   Hematocrit 36.0 - 46.0 % 41.3  41.6  43.4   Platelets 150 - 400 K/uL 175  178  215       Latest Ref Rng & Units 12/15/2022   12:16 PM 11/30/2022   12:03 PM 11/16/2022   12:31 PM  CMP  Glucose 70 - 99 mg/dL 147  829  562   BUN 8 - 23 mg/dL 16  16  19    Creatinine 0.44 - 1.00 mg/dL 1.30  8.65  7.84   Sodium 135 - 145 mmol/L 136  137  136   Potassium 3.5 - 5.1 mmol/L 4.8  4.1  3.2   Chloride 98 - 111 mmol/L 106  106  99   CO2 22 - 32 mmol/L 23  24  29    Calcium 8.9 - 10.3 mg/dL 9.1  9.2  9.3   Total Protein 6.5 - 8.1 g/dL 6.7  6.9  7.3   Total Bilirubin 0.3 - 1.2 mg/dL 1.7  1.8  2.0   Alkaline Phos 38 - 126 U/L 61  63  74   AST 15 - 41 U/L 13  13  14    ALT 0 - 44 U/L 10  10  11       Lab Results  Component Value Date   CEA1 4.53 11/01/2021   /  CEA ([facility])  Date Value Ref Range Status  11/01/2021 4.53 0.00 - 5.00 ng/mL Final    Comment:    (  NOTE) This test was performed using [employer]'s Chemiluminescent Microparticle Immunoassay. Values obtained from different assay methods cannot be used interchangeably. Please note that 5-10% of patients who smoke may see CEA levels up to 6.9 ng/mL. Performed at [facility], [address] [place], [place] [address]    No results found for: "PSA1" No results found for: "CAN199" Lab Results  Component Value Date   CAN125 18.9 08/10/2021    No results found for: "TOTALPROTELP", "ALBUMINELP", "A1GS", "A2GS", "BETS", "BETA2SER", "GAMS", "MSPIKE", "SPEI" No results found for: "TIBC", "FERRITIN", "IRONPCTSAT" No results found for:  "LDH"   STUDIES:   XR HIP UNILAT W OR W/O PELVIS 2-3 VIEWS RIGHT  Result Date: 11/24/2022 AP frog-leg right hip obtained and reviewed.  Total hip arthroplasty present. Impression: Right total hip arthroplasty with trochanteric fracture again noted with a few millimeters displacement.  No subsidence or loosening of the femoral stem.  XR Lumbar Spine 2-3 Views  Result Date: 11/24/2022 AP lateral lumbar spine images are obtained and reviewed there is anterolisthesis at L4-5 degenerative in nature.  No evidence of metastatic disease. Impression: Grade 1 anterolisthesis L4-5 degenerative in nature.

## 2022-12-16 ENCOUNTER — Other Ambulatory Visit: Payer: Self-pay

## 2022-12-27 ENCOUNTER — Other Ambulatory Visit: Payer: Self-pay | Admitting: *Deleted

## 2022-12-27 MED ORDER — HYDROCODONE-ACETAMINOPHEN 7.5-325 MG/15ML PO SOLN: 15.0000 mL | Freq: Four times a day (QID) | ORAL | 0 refills | Status: DC | PRN

## 2022-12-28 ENCOUNTER — Other Ambulatory Visit: Payer: Self-pay

## 2022-12-28 DIAGNOSIS — C3490 Malignant neoplasm of unspecified part of unspecified bronchus or lung: Secondary | ICD-10-CM

## 2022-12-29 ENCOUNTER — Inpatient Hospital Stay: Payer: Medicare HMO

## 2022-12-29 ENCOUNTER — Inpatient Hospital Stay: Payer: Medicare HMO | Attending: Hematology and Oncology

## 2022-12-29 VITALS — BP 118/68 | HR 91 | Temp 98.2°F | Resp 18 | Wt 195.8 lb

## 2022-12-29 VITALS — BP 113/67 | HR 81 | Temp 97.4°F | Resp 18

## 2022-12-29 DIAGNOSIS — C50412 Malignant neoplasm of upper-outer quadrant of left female breast: Secondary | ICD-10-CM

## 2022-12-29 DIAGNOSIS — Z5112 Encounter for antineoplastic immunotherapy: Secondary | ICD-10-CM | POA: Insufficient documentation

## 2022-12-29 DIAGNOSIS — C3432 Malignant neoplasm of lower lobe, left bronchus or lung: Secondary | ICD-10-CM | POA: Diagnosis present

## 2022-12-29 DIAGNOSIS — C321 Malignant neoplasm of supraglottis: Secondary | ICD-10-CM | POA: Diagnosis present

## 2022-12-29 DIAGNOSIS — C3490 Malignant neoplasm of unspecified part of unspecified bronchus or lung: Secondary | ICD-10-CM

## 2022-12-29 DIAGNOSIS — Z79620 Long term (current) use of immunosuppressive biologic: Secondary | ICD-10-CM | POA: Diagnosis not present

## 2022-12-29 DIAGNOSIS — Z170 Estrogen receptor positive status [ER+]: Secondary | ICD-10-CM

## 2022-12-29 DIAGNOSIS — Z95828 Presence of other vascular implants and grafts: Secondary | ICD-10-CM

## 2022-12-29 LAB — CBC WITH DIFFERENTIAL/PLATELET
Abs Immature Granulocytes: 0.02 10*3/uL (ref 0.00–0.07)
Basophils Absolute: 0 10*3/uL (ref 0.0–0.1)
Basophils Relative: 1 %
Eosinophils Absolute: 0.2 10*3/uL (ref 0.0–0.5)
Eosinophils Relative: 3 %
HCT: 43.8 % (ref 36.0–46.0)
Hemoglobin: 15 g/dL (ref 12.0–15.0)
Immature Granulocytes: 0 %
Lymphocytes Relative: 14 %
Lymphs Abs: 0.9 10*3/uL (ref 0.7–4.0)
MCH: 30.2 pg (ref 26.0–34.0)
MCHC: 34.2 g/dL (ref 30.0–36.0)
MCV: 88.3 fL (ref 80.0–100.0)
Monocytes Absolute: 0.4 10*3/uL (ref 0.1–1.0)
Monocytes Relative: 5 %
Neutro Abs: 5 10*3/uL (ref 1.7–7.7)
Neutrophils Relative %: 77 %
Platelets: 197 10*3/uL (ref 150–400)
RBC: 4.96 MIL/uL (ref 3.87–5.11)
RDW: 12.7 % (ref 11.5–15.5)
WBC: 6.4 10*3/uL (ref 4.0–10.5)
nRBC: 0 % (ref 0.0–0.2)

## 2022-12-29 LAB — COMPREHENSIVE METABOLIC PANEL
ALT: 10 U/L (ref 0–44)
AST: 15 U/L (ref 15–41)
Albumin: 4 g/dL (ref 3.5–5.0)
Alkaline Phosphatase: 70 U/L (ref 38–126)
Anion gap: 8 (ref 5–15)
BUN: 18 mg/dL (ref 8–23)
CO2: 25 mmol/L (ref 22–32)
Calcium: 9.4 mg/dL (ref 8.9–10.3)
Chloride: 102 mmol/L (ref 98–111)
Creatinine, Ser: 0.97 mg/dL (ref 0.44–1.00)
GFR, Estimated: >60 mL/min (ref >60)
Glucose, Bld: 112 mg/dL — ABNORMAL HIGH (ref 70–99)
Potassium: 3.9 mmol/L (ref 3.5–5.1)
Sodium: 135 mmol/L (ref 135–145)
Total Bilirubin: 2.1 mg/dL — ABNORMAL HIGH (ref 0.3–1.2)
Total Protein: 7.4 g/dL (ref 6.5–8.1)

## 2022-12-29 LAB — MAGNESIUM: Magnesium: 2 mg/dL (ref 1.7–2.4)

## 2022-12-29 LAB — TSH: TSH: 2.182 u[IU]/mL (ref 0.350–4.500)

## 2022-12-29 MED ORDER — HEPARIN SOD (PORK) LOCK FLUSH 100 UNIT/ML IV SOLN
500.0000 [IU] | Freq: Once | INTRAVENOUS | Status: AC | PRN
  Administered 2022-12-29: 500 [IU]

## 2022-12-29 MED ORDER — SODIUM CHLORIDE 0.9% FLUSH
10.0000 mL | INTRAVENOUS | Status: DC | PRN
  Administered 2022-12-29: 10 mL

## 2022-12-29 MED ORDER — SODIUM CHLORIDE 0.9 % IV SOLN: Freq: Once | INTRAVENOUS | Status: AC

## 2022-12-29 MED ORDER — SODIUM CHLORIDE 0.9% FLUSH
10.0000 mL | Freq: Once | INTRAVENOUS | Status: AC
  Administered 2022-12-29: 10 mL via INTRAVENOUS

## 2022-12-29 MED ORDER — SODIUM CHLORIDE 0.9 % IV SOLN
10.0000 mg/kg | Freq: Once | INTRAVENOUS | Status: AC
  Administered 2022-12-29: 860 mg via INTRAVENOUS
  Filled 2022-12-29: qty 10

## 2022-12-29 NOTE — Progress Notes (Signed)
Imfinzi given today per MD orders. Tolerated infusion without adverse affects. Vital signs stable. No complaints at this time. Discharged from clinic ambulatory in stable condition. Alert and oriented x 3. F/U with [facility] as scheduled.   

## 2022-12-29 NOTE — Patient Instructions (Signed)
[facility]  Discharge Instructions: Thank you for choosing [facility] to provide your oncology and hematology care.  If you have a lab appointment with the Cancer Center - please note that after April 8th, 2024, all labs will be drawn in the cancer center.  You do not have to check in or register with the main entrance as you have in the past but will complete your check-in in the cancer center.  Wear comfortable clothing and clothing appropriate for easy access to any Portacath or PICC line.   We strive to give you quality time with your provider. You may need to reschedule your appointment if you arrive late (15 or more minutes).  Arriving late affects you and other patients whose appointments are after yours.  Also, if you miss three or more appointments without notifying the office, you may be dismissed from the clinic at the provider's discretion.      For prescription refill requests, have your pharmacy contact our office and allow 72 hours for refills to be completed.    Today you received the following chemotherapy and/or immunotherapy agents Imfinzi    To help prevent nausea and vomiting after your treatment, we encourage you to take your nausea medication as directed.  Durvalumab Injection What is this medication? DURVALUMAB (dur VAL ue mab) treats some types of cancer. It works by helping your immune system slow or stop the spread of cancer cells. It is a monoclonal antibody. This medicine may be used for other purposes; ask your health care provider or pharmacist if you have questions. COMMON BRAND NAME(S): IMFINZI What should I tell my care team before I take this medication? They need to know if you have any of these conditions: Allogeneic stem cell transplant (uses someone else's stem cells) Autoimmune diseases, such as Crohn disease, ulcerative colitis, lupus History of chest radiation Nervous system problems, such as Guillain-Barre  syndrome, myasthenia gravis Organ transplant An unusual or allergic reaction to durvalumab, other medications, foods, dyes, or preservatives Pregnant or trying to get pregnant Breast-feeding How should I use this medication? This medication is infused into a vein. It is given by your care team in a hospital or clinic setting. A special MedGuide will be given to you before each treatment. Be sure to read this information carefully each time. Talk to your care team about the use of this medication in children. Special care may be needed. Overdosage: If you think you have taken too much of this medicine contact a poison control center or emergency room at once. NOTE: This medicine is only for you. Do not share this medicine with others. What if I miss a dose? Keep appointments for follow-up doses. It is important not to miss your dose. Call your care team if you are unable to keep an appointment. What may interact with this medication? Interactions have not been studied. This list may not describe all possible interactions. Give your health care provider a list of all the medicines, herbs, non-prescription drugs, or dietary supplements you use. Also tell them if you smoke, drink alcohol, or use illegal drugs. Some items may interact with your medicine. What should I watch for while using this medication? Your condition will be monitored carefully while you are receiving this medication. You may need blood work while taking this medication. This medication may cause serious skin reactions. They can happen weeks to months after starting the medication. Contact your care team right away if you notice   fevers or flu-like symptoms with a rash. The rash may be red or purple and then turn into blisters or peeling of the skin. You may also notice a red rash with swelling of the face, lips, or lymph nodes in your neck or under your arms. Tell your care team right away if you have any change in your  eyesight. Talk to your care team if you may be pregnant. Serious birth defects can occur if you take this medication during pregnancy and for 3 months after the last dose. You will need a negative pregnancy test before starting this medication. Contraception is recommended while taking this medication and for 3 months after the last dose. Your care team can help you find the option that works for you. Do not breastfeed while taking this medication and for 3 months after the last dose. What side effects may I notice from receiving this medication? Side effects that you should report to your care team as soon as possible: Allergic reactions--skin rash, itching, hives, swelling of the face, lips, tongue, or throat Dry cough, shortness of breath or trouble breathing Eye pain, redness, irritation, or discharge with blurry or decreased vision Heart muscle inflammation--unusual weakness or fatigue, shortness of breath, chest pain, fast or irregular heartbeat, dizziness, swelling of the ankles, feet, or hands Hormone gland problems--headache, sensitivity to light, unusual weakness or fatigue, dizziness, fast or irregular heartbeat, increased sensitivity to cold or heat, excessive sweating, constipation, hair loss, increased thirst or amount of urine, tremors or shaking, irritability Infusion reactions--chest pain, shortness of breath or trouble breathing, feeling faint or lightheaded Kidney injury (glomerulonephritis)--decrease in the amount of urine, red or dark brown urine, foamy or bubbly urine, swelling of the ankles, hands, or feet Liver injury--right upper belly pain, loss of appetite, nausea, light-colored stool, dark yellow or brown urine, yellowing skin or eyes, unusual weakness or fatigue Pain, tingling, or numbness in the hands or feet, muscle weakness, change in vision, confusion or trouble speaking, loss of balance or coordination, trouble walking, seizures Rash, fever, and swollen lymph  nodes Redness, blistering, peeling, or loosening of the skin, including inside the mouth Sudden or severe stomach pain, bloody diarrhea, fever, nausea, vomiting Side effects that usually do not require medical attention (report these to your care team if they continue or are bothersome): Bone, joint, or muscle pain Diarrhea Fatigue Loss of appetite Nausea Skin rash This list may not describe all possible side effects. Call your doctor for medical advice about side effects. You may report side effects to FDA at 1-800-FDA-1088. Where should I keep my medication? This medication is given in a hospital or clinic. It will not be stored at home. NOTE: This sheet is a summary. It may not cover all possible information. If you have questions about this medicine, talk to your doctor, pharmacist, or health care provider.  2024 Elsevier/Gold Standard (2021-11-23 00:00:00)   BELOW ARE SYMPTOMS THAT SHOULD BE REPORTED IMMEDIATELY: *FEVER GREATER THAN 100.4 F (38 C) OR HIGHER *CHILLS OR SWEATING *NAUSEA AND VOMITING THAT IS NOT CONTROLLED WITH YOUR NAUSEA MEDICATION *UNUSUAL SHORTNESS OF BREATH *UNUSUAL BRUISING OR BLEEDING *URINARY PROBLEMS (pain or burning when urinating, or frequent urination) *BOWEL PROBLEMS (unusual diarrhea, constipation, pain near the anus) TENDERNESS IN MOUTH AND THROAT WITH OR WITHOUT PRESENCE OF ULCERS (sore throat, sores in mouth, or a toothache) UNUSUAL RASH, SWELLING OR PAIN  UNUSUAL VAGINAL DISCHARGE OR ITCHING   Items with * indicate a potential emergency and should be followed up as   soon as possible or go to the Emergency Department if any problems should occur.  Please show the CHEMOTHERAPY ALERT CARD or IMMUNOTHERAPY ALERT CARD at check-in to the Emergency Department and triage nurse.  Should you have questions after your visit or need to cancel or reschedule your appointment, please contact [facility] [phone]  and follow the  prompts.  Office hours are 8:00 a.m. to 4:30 p.m. Monday - Friday. Please note that voicemails left after 4:00 p.m. may not be returned until the following business day.  We are closed weekends and major holidays. You have access to a nurse at all times for urgent questions. Please call the main number to the clinic [phone] and follow the prompts.  For any non-urgent questions, you may also contact your provider using MyChart. We now offer e-Visits for anyone 18 and older to request care online for non-urgent symptoms. For details visit [email or web address].   Also download the MyChart app! Go to the app store, search "MyChart", open the app, select [place], and log in with your MyChart username and password.   

## 2022-12-29 NOTE — Progress Notes (Signed)
Labs reviewed today . Patient has finished her antibiotics and is coughing up clear phlegm now. No new issues to be reported by patient. Will proceed as planned per parameters

## 2022-12-30 ENCOUNTER — Other Ambulatory Visit (HOSPITAL_COMMUNITY): Payer: Self-pay | Admitting: Internal Medicine

## 2022-12-30 DIAGNOSIS — Z1231 Encounter for screening mammogram for malignant neoplasm of breast: Secondary | ICD-10-CM

## 2022-12-31 ENCOUNTER — Other Ambulatory Visit: Payer: Self-pay

## 2023-01-03 ENCOUNTER — Other Ambulatory Visit: Payer: Self-pay | Admitting: Physician Assistant

## 2023-01-04 ENCOUNTER — Encounter: Payer: Self-pay | Admitting: Hematology and Oncology

## 2023-01-04 ENCOUNTER — Encounter: Payer: Self-pay | Admitting: Hematology

## 2023-01-09 ENCOUNTER — Other Ambulatory Visit: Payer: Self-pay

## 2023-01-09 MED ORDER — HYDROCODONE-ACETAMINOPHEN 7.5-325 MG/15ML PO SOLN: 15.0000 mL | Freq: Four times a day (QID) | ORAL | 0 refills | Status: DC | PRN

## 2023-01-16 ENCOUNTER — Inpatient Hospital Stay: Payer: Medicare HMO

## 2023-01-16 ENCOUNTER — Ambulatory Visit: Payer: Medicare HMO | Admitting: Hematology

## 2023-01-16 VITALS — BP 125/63 | HR 62 | Temp 97.1°F | Resp 16 | Wt 201.0 lb

## 2023-01-16 VITALS — BP 108/73 | HR 75 | Temp 97.2°F | Resp 18

## 2023-01-16 DIAGNOSIS — C321 Malignant neoplasm of supraglottis: Secondary | ICD-10-CM | POA: Diagnosis not present

## 2023-01-16 DIAGNOSIS — C3490 Malignant neoplasm of unspecified part of unspecified bronchus or lung: Secondary | ICD-10-CM

## 2023-01-16 DIAGNOSIS — Z79620 Long term (current) use of immunosuppressive biologic: Secondary | ICD-10-CM | POA: Diagnosis not present

## 2023-01-16 DIAGNOSIS — Z95828 Presence of other vascular implants and grafts: Secondary | ICD-10-CM

## 2023-01-16 DIAGNOSIS — C3432 Malignant neoplasm of lower lobe, left bronchus or lung: Secondary | ICD-10-CM | POA: Diagnosis not present

## 2023-01-16 DIAGNOSIS — Z5112 Encounter for antineoplastic immunotherapy: Secondary | ICD-10-CM | POA: Diagnosis not present

## 2023-01-16 LAB — CBC WITH DIFFERENTIAL/PLATELET
Abs Immature Granulocytes: 0.02 10*3/uL (ref 0.00–0.07)
Basophils Absolute: 0 10*3/uL (ref 0.0–0.1)
Basophils Relative: 1 %
Eosinophils Absolute: 0.2 10*3/uL (ref 0.0–0.5)
Eosinophils Relative: 4 %
HCT: 42 % (ref 36.0–46.0)
Hemoglobin: 14 g/dL (ref 12.0–15.0)
Immature Granulocytes: 0 %
Lymphocytes Relative: 18 %
Lymphs Abs: 0.9 10*3/uL (ref 0.7–4.0)
MCH: 29.9 pg (ref 26.0–34.0)
MCHC: 33.3 g/dL (ref 30.0–36.0)
MCV: 89.6 fL (ref 80.0–100.0)
Monocytes Absolute: 0.4 10*3/uL (ref 0.1–1.0)
Monocytes Relative: 8 %
Neutro Abs: 3.6 10*3/uL (ref 1.7–7.7)
Neutrophils Relative %: 69 %
Platelets: 186 10*3/uL (ref 150–400)
RBC: 4.69 MIL/uL (ref 3.87–5.11)
RDW: 12.6 % (ref 11.5–15.5)
WBC: 5.2 10*3/uL (ref 4.0–10.5)
nRBC: 0 % (ref 0.0–0.2)

## 2023-01-16 LAB — COMPREHENSIVE METABOLIC PANEL
ALT: 10 U/L (ref 0–44)
AST: 13 U/L — ABNORMAL LOW (ref 15–41)
Albumin: 3.7 g/dL (ref 3.5–5.0)
Alkaline Phosphatase: 63 U/L (ref 38–126)
Anion gap: 6 (ref 5–15)
BUN: 14 mg/dL (ref 8–23)
CO2: 25 mmol/L (ref 22–32)
Calcium: 9 mg/dL (ref 8.9–10.3)
Chloride: 103 mmol/L (ref 98–111)
Creatinine, Ser: 0.86 mg/dL (ref 0.44–1.00)
GFR, Estimated: >60 mL/min (ref >60)
Glucose, Bld: 99 mg/dL (ref 70–99)
Potassium: 3.8 mmol/L (ref 3.5–5.1)
Sodium: 134 mmol/L — ABNORMAL LOW (ref 135–145)
Total Bilirubin: 1.5 mg/dL — ABNORMAL HIGH (ref 0.3–1.2)
Total Protein: 6.8 g/dL (ref 6.5–8.1)

## 2023-01-16 LAB — MAGNESIUM: Magnesium: 2.1 mg/dL (ref 1.7–2.4)

## 2023-01-16 LAB — TSH: TSH: 4.047 u[IU]/mL (ref 0.350–4.500)

## 2023-01-16 MED ORDER — SODIUM CHLORIDE 0.9% FLUSH
10.0000 mL | INTRAVENOUS | Status: DC | PRN
  Administered 2023-01-16: 10 mL

## 2023-01-16 MED ORDER — HEPARIN SOD (PORK) LOCK FLUSH 100 UNIT/ML IV SOLN
500.0000 [IU] | Freq: Once | INTRAVENOUS | Status: AC | PRN
  Administered 2023-01-16: 500 [IU]

## 2023-01-16 MED ORDER — SODIUM CHLORIDE 0.9% FLUSH
10.0000 mL | Freq: Once | INTRAVENOUS | Status: AC
  Administered 2023-01-16: 10 mL via INTRAVENOUS

## 2023-01-16 MED ORDER — SODIUM CHLORIDE 0.9 % IV SOLN: Freq: Once | INTRAVENOUS | Status: AC

## 2023-01-16 MED ORDER — SODIUM CHLORIDE 0.9 % IV SOLN
10.0000 mg/kg | Freq: Once | INTRAVENOUS | Status: AC
  Administered 2023-01-16: 860 mg via INTRAVENOUS
  Filled 2023-01-16: qty 10

## 2023-01-16 NOTE — Patient Instructions (Signed)
[facility]  Discharge Instructions: Thank you for choosing [facility] to provide your oncology and hematology care.  If you have a lab appointment with the Cancer Center - please note that after April 8th, 2024, all labs will be drawn in the cancer center.  You do not have to check in or register with the main entrance as you have in the past but will complete your check-in in the cancer center.  Wear comfortable clothing and clothing appropriate for easy access to any Portacath or PICC line.   We strive to give you quality time with your provider. You may need to reschedule your appointment if you arrive late (15 or more minutes).  Arriving late affects you and other patients whose appointments are after yours.  Also, if you miss three or more appointments without notifying the office, you may be dismissed from the clinic at the provider's discretion.      For prescription refill requests, have your pharmacy contact our office and allow 72 hours for refills to be completed.    Today you received the following chemotherapy and/or immunotherapy agents Imfinzi      To help prevent nausea and vomiting after your treatment, we encourage you to take your nausea medication as directed.  BELOW ARE SYMPTOMS THAT SHOULD BE REPORTED IMMEDIATELY: *FEVER GREATER THAN 100.4 F (38 C) OR HIGHER *CHILLS OR SWEATING *NAUSEA AND VOMITING THAT IS NOT CONTROLLED WITH YOUR NAUSEA MEDICATION *UNUSUAL SHORTNESS OF BREATH *UNUSUAL BRUISING OR BLEEDING *URINARY PROBLEMS (pain or burning when urinating, or frequent urination) *BOWEL PROBLEMS (unusual diarrhea, constipation, pain near the anus) TENDERNESS IN MOUTH AND THROAT WITH OR WITHOUT PRESENCE OF ULCERS (sore throat, sores in mouth, or a toothache) UNUSUAL RASH, SWELLING OR PAIN  UNUSUAL VAGINAL DISCHARGE OR ITCHING   Items with * indicate a potential emergency and should be followed up as soon as possible or go to the  Emergency Department if any problems should occur.  Please show the CHEMOTHERAPY ALERT CARD or IMMUNOTHERAPY ALERT CARD at check-in to the Emergency Department and triage nurse.  Should you have questions after your visit or need to cancel or reschedule your appointment, please contact [facility] [phone]  and follow the prompts.  Office hours are 8:00 a.m. to 4:30 p.m. Monday - Friday. Please note that voicemails left after 4:00 p.m. may not be returned until the following business day.  We are closed weekends and major holidays. You have access to a nurse at all times for urgent questions. Please call the main number to the clinic [phone] and follow the prompts.  For any non-urgent questions, you may also contact your provider using MyChart. We now offer e-Visits for anyone 18 and older to request care online for non-urgent symptoms. For details visit [email or web address].   Also download the MyChart app! Go to the app store, search "MyChart", open the app, select [place], and log in with your MyChart username and password.   

## 2023-01-16 NOTE — Progress Notes (Signed)
Patient presents today for Imfinzi infusion per providers order.  Vital signs and labs within parameters for treatment.  Patient has no new complaints at this time.  Treatment given today per MD orders.  Stable during infusion without adverse affects.  Vital signs stable.  No complaints at this time.  Discharge from clinic ambulatory in stable condition.  Alert and oriented X 3.  Follow up with [facility] as scheduled.  

## 2023-01-23 ENCOUNTER — Other Ambulatory Visit: Payer: Self-pay | Admitting: *Deleted

## 2023-01-23 MED ORDER — IBUPROFEN 100 MG/5ML PO SUSP: 200.0000 mg | ORAL | 0 refills | Status: AC | PRN

## 2023-01-23 MED ORDER — HYDROCODONE-ACETAMINOPHEN 7.5-325 MG/15ML PO SOLN: 15.0000 mL | Freq: Four times a day (QID) | ORAL | 0 refills | Status: DC | PRN

## 2023-01-25 ENCOUNTER — Inpatient Hospital Stay: Payer: Medicare HMO | Attending: Hematology and Oncology

## 2023-01-25 ENCOUNTER — Ambulatory Visit (HOSPITAL_COMMUNITY)
Admission: RE | Admit: 2023-01-25 | Discharge: 2023-01-25 | Disposition: A | Discharge status: Home / Self Care | Payer: Medicare HMO | Source: Ambulatory Visit | Attending: Hematology | Admitting: Hematology

## 2023-01-25 DIAGNOSIS — Z79899 Other long term (current) drug therapy: Secondary | ICD-10-CM | POA: Insufficient documentation

## 2023-01-25 DIAGNOSIS — E876 Hypokalemia: Secondary | ICD-10-CM | POA: Insufficient documentation

## 2023-01-25 DIAGNOSIS — Z87891 Personal history of nicotine dependence: Secondary | ICD-10-CM | POA: Insufficient documentation

## 2023-01-25 DIAGNOSIS — I700 Atherosclerosis of aorta: Secondary | ICD-10-CM | POA: Insufficient documentation

## 2023-01-25 DIAGNOSIS — J439 Emphysema, unspecified: Secondary | ICD-10-CM | POA: Insufficient documentation

## 2023-01-25 DIAGNOSIS — D696 Thrombocytopenia, unspecified: Secondary | ICD-10-CM | POA: Insufficient documentation

## 2023-01-25 DIAGNOSIS — C3490 Malignant neoplasm of unspecified part of unspecified bronchus or lung: Secondary | ICD-10-CM

## 2023-01-25 DIAGNOSIS — Z79620 Long term (current) use of immunosuppressive biologic: Secondary | ICD-10-CM | POA: Insufficient documentation

## 2023-01-25 DIAGNOSIS — Z5112 Encounter for antineoplastic immunotherapy: Secondary | ICD-10-CM | POA: Insufficient documentation

## 2023-01-25 DIAGNOSIS — Z800 Family history of malignant neoplasm of digestive organs: Secondary | ICD-10-CM | POA: Insufficient documentation

## 2023-01-25 DIAGNOSIS — Z8719 Personal history of other diseases of the digestive system: Secondary | ICD-10-CM | POA: Insufficient documentation

## 2023-01-25 DIAGNOSIS — E039 Hypothyroidism, unspecified: Secondary | ICD-10-CM | POA: Insufficient documentation

## 2023-01-25 DIAGNOSIS — Z9049 Acquired absence of other specified parts of digestive tract: Secondary | ICD-10-CM | POA: Insufficient documentation

## 2023-01-25 DIAGNOSIS — E041 Nontoxic single thyroid nodule: Secondary | ICD-10-CM | POA: Insufficient documentation

## 2023-01-25 DIAGNOSIS — M4854XA Collapsed vertebra, not elsewhere classified, thoracic region, initial encounter for fracture: Secondary | ICD-10-CM | POA: Insufficient documentation

## 2023-01-25 DIAGNOSIS — J90 Pleural effusion, not elsewhere classified: Secondary | ICD-10-CM | POA: Insufficient documentation

## 2023-01-25 DIAGNOSIS — C3432 Malignant neoplasm of lower lobe, left bronchus or lung: Secondary | ICD-10-CM | POA: Insufficient documentation

## 2023-01-25 DIAGNOSIS — R072 Precordial pain: Secondary | ICD-10-CM | POA: Insufficient documentation

## 2023-01-25 DIAGNOSIS — Z923 Personal history of irradiation: Secondary | ICD-10-CM | POA: Insufficient documentation

## 2023-01-25 DIAGNOSIS — J432 Centrilobular emphysema: Secondary | ICD-10-CM | POA: Insufficient documentation

## 2023-01-25 DIAGNOSIS — Z801 Family history of malignant neoplasm of trachea, bronchus and lung: Secondary | ICD-10-CM | POA: Insufficient documentation

## 2023-01-25 DIAGNOSIS — Z808 Family history of malignant neoplasm of other organs or systems: Secondary | ICD-10-CM | POA: Insufficient documentation

## 2023-01-25 DIAGNOSIS — I3139 Other pericardial effusion (noninflammatory): Secondary | ICD-10-CM | POA: Insufficient documentation

## 2023-01-25 DIAGNOSIS — Z853 Personal history of malignant neoplasm of breast: Secondary | ICD-10-CM | POA: Insufficient documentation

## 2023-01-25 DIAGNOSIS — R070 Pain in throat: Secondary | ICD-10-CM | POA: Insufficient documentation

## 2023-01-25 DIAGNOSIS — C321 Malignant neoplasm of supraglottis: Secondary | ICD-10-CM | POA: Insufficient documentation

## 2023-01-25 DIAGNOSIS — Z79811 Long term (current) use of aromatase inhibitors: Secondary | ICD-10-CM | POA: Insufficient documentation

## 2023-01-25 DIAGNOSIS — Z79890 Hormone replacement therapy (postmenopausal): Secondary | ICD-10-CM | POA: Insufficient documentation

## 2023-01-25 DIAGNOSIS — Z8041 Family history of malignant neoplasm of ovary: Secondary | ICD-10-CM | POA: Insufficient documentation

## 2023-01-25 LAB — COMPREHENSIVE METABOLIC PANEL
ALT: 11 U/L (ref 0–44)
AST: 13 U/L — ABNORMAL LOW (ref 15–41)
Albumin: 3.5 g/dL (ref 3.5–5.0)
Alkaline Phosphatase: 64 U/L (ref 38–126)
Anion gap: 6 (ref 5–15)
BUN: 19 mg/dL (ref 8–23)
CO2: 23 mmol/L (ref 22–32)
Calcium: 9.4 mg/dL (ref 8.9–10.3)
Chloride: 108 mmol/L (ref 98–111)
Creatinine, Ser: 0.91 mg/dL (ref 0.44–1.00)
GFR, Estimated: >60 mL/min (ref >60)
Glucose, Bld: 118 mg/dL — ABNORMAL HIGH (ref 70–99)
Potassium: 3.9 mmol/L (ref 3.5–5.1)
Sodium: 137 mmol/L (ref 135–145)
Total Bilirubin: 1.1 mg/dL (ref 0.3–1.2)
Total Protein: 6.7 g/dL (ref 6.5–8.1)

## 2023-01-25 LAB — CBC WITH DIFFERENTIAL/PLATELET
Abs Immature Granulocytes: 0.01 10*3/uL (ref 0.00–0.07)
Basophils Absolute: 0 10*3/uL (ref 0.0–0.1)
Basophils Relative: 1 %
Eosinophils Absolute: 0.2 10*3/uL (ref 0.0–0.5)
Eosinophils Relative: 4 %
HCT: 40.8 % (ref 36.0–46.0)
Hemoglobin: 13.6 g/dL (ref 12.0–15.0)
Immature Granulocytes: 0 %
Lymphocytes Relative: 15 %
Lymphs Abs: 0.8 10*3/uL (ref 0.7–4.0)
MCH: 29.9 pg (ref 26.0–34.0)
MCHC: 33.3 g/dL (ref 30.0–36.0)
MCV: 89.7 fL (ref 80.0–100.0)
Monocytes Absolute: 0.4 10*3/uL (ref 0.1–1.0)
Monocytes Relative: 7 %
Neutro Abs: 3.7 10*3/uL (ref 1.7–7.7)
Neutrophils Relative %: 73 %
Platelets: 172 10*3/uL (ref 150–400)
RBC: 4.55 MIL/uL (ref 3.87–5.11)
RDW: 12.6 % (ref 11.5–15.5)
WBC: 5.1 10*3/uL (ref 4.0–10.5)
nRBC: 0 % (ref 0.0–0.2)

## 2023-01-25 LAB — TSH: TSH: 7.524 u[IU]/mL — ABNORMAL HIGH (ref 0.350–4.500)

## 2023-01-25 LAB — MAGNESIUM: Magnesium: 2.1 mg/dL (ref 1.7–2.4)

## 2023-01-25 MED ORDER — IOHEXOL 300 MG/ML  SOLN
75.0000 mL | Freq: Once | INTRAMUSCULAR | Status: AC | PRN
  Administered 2023-01-25: 75 mL via INTRAVENOUS

## 2023-01-25 MED ORDER — HEPARIN SOD (PORK) LOCK FLUSH 100 UNIT/ML IV SOLN
INTRAVENOUS | Status: AC
  Filled 2023-01-25: qty 5

## 2023-01-25 MED ORDER — HEPARIN SOD (PORK) LOCK FLUSH 100 UNIT/ML IV SOLN
500.0000 [IU] | Freq: Once | INTRAVENOUS | Status: AC
  Administered 2023-01-25: 500 [IU] via INTRAVENOUS

## 2023-01-25 MED ORDER — SODIUM CHLORIDE 0.9% FLUSH
10.0000 mL | Freq: Once | INTRAVENOUS | Status: AC
  Administered 2023-01-25: 10 mL via INTRAVENOUS

## 2023-01-29 ENCOUNTER — Other Ambulatory Visit: Payer: Self-pay | Admitting: Hematology

## 2023-01-30 ENCOUNTER — Other Ambulatory Visit: Payer: Self-pay | Admitting: Hematology

## 2023-01-30 ENCOUNTER — Encounter: Payer: Self-pay | Admitting: Hematology

## 2023-01-30 ENCOUNTER — Encounter: Payer: Self-pay | Admitting: Hematology and Oncology

## 2023-01-31 ENCOUNTER — Ambulatory Visit: Payer: Medicare HMO

## 2023-01-31 ENCOUNTER — Ambulatory Visit: Payer: Medicare HMO | Admitting: Hematology

## 2023-01-31 ENCOUNTER — Other Ambulatory Visit: Payer: Medicare HMO

## 2023-01-31 ENCOUNTER — Inpatient Hospital Stay: Payer: Medicare HMO

## 2023-01-31 ENCOUNTER — Encounter: Payer: Self-pay | Admitting: Hematology

## 2023-01-31 ENCOUNTER — Inpatient Hospital Stay: Payer: Medicare HMO | Admitting: Hematology

## 2023-01-31 ENCOUNTER — Other Ambulatory Visit: Payer: Self-pay | Admitting: *Deleted

## 2023-01-31 VITALS — BP 140/85 | HR 69 | Temp 97.8°F | Resp 18

## 2023-01-31 VITALS — BP 135/87 | HR 66 | Temp 98.0°F | Resp 18 | Wt 203.5 lb

## 2023-01-31 DIAGNOSIS — Z800 Family history of malignant neoplasm of digestive organs: Secondary | ICD-10-CM | POA: Diagnosis not present

## 2023-01-31 DIAGNOSIS — Z79890 Hormone replacement therapy (postmenopausal): Secondary | ICD-10-CM | POA: Diagnosis not present

## 2023-01-31 DIAGNOSIS — Z79899 Other long term (current) drug therapy: Secondary | ICD-10-CM | POA: Diagnosis not present

## 2023-01-31 DIAGNOSIS — J432 Centrilobular emphysema: Secondary | ICD-10-CM | POA: Diagnosis not present

## 2023-01-31 DIAGNOSIS — I3139 Other pericardial effusion (noninflammatory): Secondary | ICD-10-CM | POA: Diagnosis not present

## 2023-01-31 DIAGNOSIS — C321 Malignant neoplasm of supraglottis: Secondary | ICD-10-CM | POA: Diagnosis not present

## 2023-01-31 DIAGNOSIS — R072 Precordial pain: Secondary | ICD-10-CM | POA: Diagnosis not present

## 2023-01-31 DIAGNOSIS — C3490 Malignant neoplasm of unspecified part of unspecified bronchus or lung: Secondary | ICD-10-CM

## 2023-01-31 DIAGNOSIS — Z853 Personal history of malignant neoplasm of breast: Secondary | ICD-10-CM | POA: Diagnosis not present

## 2023-01-31 DIAGNOSIS — Z9049 Acquired absence of other specified parts of digestive tract: Secondary | ICD-10-CM | POA: Diagnosis not present

## 2023-01-31 DIAGNOSIS — E876 Hypokalemia: Secondary | ICD-10-CM | POA: Diagnosis not present

## 2023-01-31 DIAGNOSIS — E041 Nontoxic single thyroid nodule: Secondary | ICD-10-CM | POA: Diagnosis not present

## 2023-01-31 DIAGNOSIS — J90 Pleural effusion, not elsewhere classified: Secondary | ICD-10-CM | POA: Diagnosis not present

## 2023-01-31 DIAGNOSIS — Z79811 Long term (current) use of aromatase inhibitors: Secondary | ICD-10-CM | POA: Diagnosis not present

## 2023-01-31 DIAGNOSIS — I700 Atherosclerosis of aorta: Secondary | ICD-10-CM | POA: Diagnosis not present

## 2023-01-31 DIAGNOSIS — Z5112 Encounter for antineoplastic immunotherapy: Secondary | ICD-10-CM | POA: Diagnosis not present

## 2023-01-31 DIAGNOSIS — C3432 Malignant neoplasm of lower lobe, left bronchus or lung: Secondary | ICD-10-CM | POA: Diagnosis not present

## 2023-01-31 DIAGNOSIS — R070 Pain in throat: Secondary | ICD-10-CM | POA: Diagnosis not present

## 2023-01-31 DIAGNOSIS — D696 Thrombocytopenia, unspecified: Secondary | ICD-10-CM | POA: Diagnosis not present

## 2023-01-31 DIAGNOSIS — Z8719 Personal history of other diseases of the digestive system: Secondary | ICD-10-CM | POA: Diagnosis not present

## 2023-01-31 DIAGNOSIS — E039 Hypothyroidism, unspecified: Secondary | ICD-10-CM | POA: Diagnosis not present

## 2023-01-31 DIAGNOSIS — Z79620 Long term (current) use of immunosuppressive biologic: Secondary | ICD-10-CM | POA: Diagnosis not present

## 2023-01-31 DIAGNOSIS — Z923 Personal history of irradiation: Secondary | ICD-10-CM | POA: Diagnosis not present

## 2023-01-31 DIAGNOSIS — Z87891 Personal history of nicotine dependence: Secondary | ICD-10-CM | POA: Diagnosis not present

## 2023-01-31 DIAGNOSIS — M4854XA Collapsed vertebra, not elsewhere classified, thoracic region, initial encounter for fracture: Secondary | ICD-10-CM | POA: Diagnosis not present

## 2023-01-31 MED ORDER — HEPARIN SOD (PORK) LOCK FLUSH 100 UNIT/ML IV SOLN
500.0000 [IU] | Freq: Once | INTRAVENOUS | Status: AC | PRN
  Administered 2023-01-31: 500 [IU]

## 2023-01-31 MED ORDER — SODIUM CHLORIDE 0.9 % IV SOLN: Freq: Once | INTRAVENOUS | Status: AC

## 2023-01-31 MED ORDER — SODIUM CHLORIDE 0.9% FLUSH
10.0000 mL | INTRAVENOUS | Status: DC | PRN
  Administered 2023-01-31: 10 mL

## 2023-01-31 MED ORDER — HYDROCODONE-ACETAMINOPHEN 7.5-325 MG/15ML PO SOLN: 15.0000 mL | Freq: Four times a day (QID) | ORAL | 0 refills | Status: DC | PRN

## 2023-01-31 MED ORDER — SODIUM CHLORIDE 0.9 % IV SOLN
10.0000 mg/kg | Freq: Once | INTRAVENOUS | Status: AC
  Administered 2023-01-31: 860 mg via INTRAVENOUS
  Filled 2023-01-31: qty 10

## 2023-01-31 NOTE — Progress Notes (Signed)
Patient has been examined by Dr. Katragadda. Vital signs and labs have been reviewed by MD - ANC, Creatinine, LFTs, hemoglobin, and platelets are within treatment parameters per M.D. - pt may proceed with treatment.  Primary RN and pharmacy notified.  

## 2023-01-31 NOTE — Patient Instructions (Signed)
[facility]  Discharge Instructions: Thank you for choosing [facility] to provide your oncology and hematology care.  If you have a lab appointment with the Cancer Center - please note that after April 8th, 2024, all labs will be drawn in the cancer center.  You do not have to check in or register with the main entrance as you have in the past but will complete your check-in in the cancer center.  Wear comfortable clothing and clothing appropriate for easy access to any Portacath or PICC line.   We strive to give you quality time with your provider. You may need to reschedule your appointment if you arrive late (15 or more minutes).  Arriving late affects you and other patients whose appointments are after yours.  Also, if you miss three or more appointments without notifying the office, you may be dismissed from the clinic at the provider's discretion.      For prescription refill requests, have your pharmacy contact our office and allow 72 hours for refills to be completed.    Today you received the following chemotherapy and/or immunotherapy agents Imfinzi.  Durvalumab Injection What is this medication? DURVALUMAB (dur VAL ue mab) treats some types of cancer. It works by helping your immune system slow or stop the spread of cancer cells. It is a monoclonal antibody. This medicine may be used for other purposes; ask your health care provider or pharmacist if you have questions. COMMON BRAND NAME(S): IMFINZI What should I tell my care team before I take this medication? They need to know if you have any of these conditions: Allogeneic stem cell transplant (uses someone else's stem cells) Autoimmune diseases, such as Crohn disease, ulcerative colitis, lupus History of chest radiation Nervous system problems, such as Guillain-Barre syndrome, myasthenia gravis Organ transplant An unusual or allergic reaction to durvalumab, other medications, foods, dyes, or  preservatives Pregnant or trying to get pregnant Breast-feeding How should I use this medication? This medication is infused into a vein. It is given by your care team in a hospital or clinic setting. A special MedGuide will be given to you before each treatment. Be sure to read this information carefully each time. Talk to your care team about the use of this medication in children. Special care may be needed. Overdosage: If you think you have taken too much of this medicine contact a poison control center or emergency room at once. NOTE: This medicine is only for you. Do not share this medicine with others. What if I miss a dose? Keep appointments for follow-up doses. It is important not to miss your dose. Call your care team if you are unable to keep an appointment. What may interact with this medication? Interactions have not been studied. This list may not describe all possible interactions. Give your health care provider a list of all the medicines, herbs, non-prescription drugs, or dietary supplements you use. Also tell them if you smoke, drink alcohol, or use illegal drugs. Some items may interact with your medicine. What should I watch for while using this medication? Your condition will be monitored carefully while you are receiving this medication. You may need blood work while taking this medication. This medication may cause serious skin reactions. They can happen weeks to months after starting the medication. Contact your care team right away if you notice fevers or flu-like symptoms with a rash. The rash may be red or purple and then turn into blisters or peeling of   the skin. You may also notice a red rash with swelling of the face, lips, or lymph nodes in your neck or under your arms. Tell your care team right away if you have any change in your eyesight. Talk to your care team if you may be pregnant. Serious birth defects can occur if you take this medication during pregnancy and  for 3 months after the last dose. You will need a negative pregnancy test before starting this medication. Contraception is recommended while taking this medication and for 3 months after the last dose. Your care team can help you find the option that works for you. Do not breastfeed while taking this medication and for 3 months after the last dose. What side effects may I notice from receiving this medication? Side effects that you should report to your care team as soon as possible: Allergic reactions--skin rash, itching, hives, swelling of the face, lips, tongue, or throat Dry cough, shortness of breath or trouble breathing Eye pain, redness, irritation, or discharge with blurry or decreased vision Heart muscle inflammation--unusual weakness or fatigue, shortness of breath, chest pain, fast or irregular heartbeat, dizziness, swelling of the ankles, feet, or hands Hormone gland problems--headache, sensitivity to light, unusual weakness or fatigue, dizziness, fast or irregular heartbeat, increased sensitivity to cold or heat, excessive sweating, constipation, hair loss, increased thirst or amount of urine, tremors or shaking, irritability Infusion reactions--chest pain, shortness of breath or trouble breathing, feeling faint or lightheaded Kidney injury (glomerulonephritis)--decrease in the amount of urine, red or dark brown urine, foamy or bubbly urine, swelling of the ankles, hands, or feet Liver injury--right upper belly pain, loss of appetite, nausea, light-colored stool, dark yellow or brown urine, yellowing skin or eyes, unusual weakness or fatigue Pain, tingling, or numbness in the hands or feet, muscle weakness, change in vision, confusion or trouble speaking, loss of balance or coordination, trouble walking, seizures Rash, fever, and swollen lymph nodes Redness, blistering, peeling, or loosening of the skin, including inside the mouth Sudden or severe stomach pain, bloody diarrhea, fever,  nausea, vomiting Side effects that usually do not require medical attention (report these to your care team if they continue or are bothersome): Bone, joint, or muscle pain Diarrhea Fatigue Loss of appetite Nausea Skin rash This list may not describe all possible side effects. Call your doctor for medical advice about side effects. You may report side effects to FDA at 1-800-FDA-1088. Where should I keep my medication? This medication is given in a hospital or clinic. It will not be stored at home. NOTE: This sheet is a summary. It may not cover all possible information. If you have questions about this medicine, talk to your doctor, pharmacist, or health care provider.  2024 Elsevier/Gold Standard (2021-11-23 00:00:00)       To help prevent nausea and vomiting after your treatment, we encourage you to take your nausea medication as directed.  BELOW ARE SYMPTOMS THAT SHOULD BE REPORTED IMMEDIATELY: *FEVER GREATER THAN 100.4 F (38 C) OR HIGHER *CHILLS OR SWEATING *NAUSEA AND VOMITING THAT IS NOT CONTROLLED WITH YOUR NAUSEA MEDICATION *UNUSUAL SHORTNESS OF BREATH *UNUSUAL BRUISING OR BLEEDING *URINARY PROBLEMS (pain or burning when urinating, or frequent urination) *BOWEL PROBLEMS (unusual diarrhea, constipation, pain near the anus) TENDERNESS IN MOUTH AND THROAT WITH OR WITHOUT PRESENCE OF ULCERS (sore throat, sores in mouth, or a toothache) UNUSUAL RASH, SWELLING OR PAIN  UNUSUAL VAGINAL DISCHARGE OR ITCHING   Items with * indicate a potential emergency and should be followed   up as soon as possible or go to the Emergency Department if any problems should occur.  Please show the CHEMOTHERAPY ALERT CARD or IMMUNOTHERAPY ALERT CARD at check-in to the Emergency Department and triage nurse.  Should you have questions after your visit or need to cancel or reschedule your appointment, please contact [facility] [phone]  and follow the prompts.  Office hours are  8:00 a.m. to 4:30 p.m. Monday - Friday. Please note that voicemails left after 4:00 p.m. may not be returned until the following business day.  We are closed weekends and major holidays. You have access to a nurse at all times for urgent questions. Please call the main number to the clinic [phone] and follow the prompts.  For any non-urgent questions, you may also contact your provider using MyChart. We now offer e-Visits for anyone 18 and older to request care online for non-urgent symptoms. For details visit [email or web address].   Also download the MyChart app! Go to the app store, search "MyChart", open the app, select [place], and log in with your MyChart username and password.   

## 2023-01-31 NOTE — Progress Notes (Signed)
Patient presents today for treatment and follow up visit with Dr. Ellin Saba.   Message received from A. Dareen Piano RN / Dr. Ellin Saba to proceed with treatment.   Treatment given today per MD orders. Tolerated infusion without adverse affects. Vital signs stable. No complaints at this time. Discharged from clinic ambulatory in stable condition. Alert and oriented x 3. F/U with [facility] as scheduled.

## 2023-01-31 NOTE — Progress Notes (Signed)
[facility] [address], [place] [address]    Clinic Day:  01/31/2023  Referring physician: Benita Stabile, MD  Patient Care Team: Benita Stabile, MD as PCP - General (Internal Medicine) Laqueta Linden, MD (Inactive) as PCP - Cardiology (Cardiology) Harriette Bouillon, MD as Consulting Physician (General Surgery) Lurline Hare, MD as Consulting Physician (Radiation Oncology) Salomon Fick, NP as Nurse Practitioner (Hematology and Oncology) West Bali, MD (Inactive) as Consulting Physician (Gastroenterology) Doreatha Massed, MD as Medical Oncologist (Medical Oncology) Malmfelt, Lise Auer, RN as Oncology Nurse Navigator Christia Reading, MD as Consulting Physician (Otolaryngology) Lonie Peak, MD as Consulting Physician (Radiation Oncology) [name], MD as Consulting Physician (Hematology and Oncology) Therese Sarah, RN as Oncology Nurse Navigator (Medical Oncology)   ASSESSMENT & PLAN:   Assessment: 1. Stage III squamous cell cancer: - LLL lung mass FNA (08/23/2021): Malignant cells consistent with squamous cell carcinoma. - Lymph node 4R and 7: Malignant cells consistent with NSCLC. - Chemoradiation therapy with weekly carboplatin and paclitaxel from 11/01/2021, week for chemotherapy on 11/23/2021.  XRT completed on 12/16/2021. - She missed further doses of chemotherapy secondary to thrombocytopenia. - PET scan (01/27/2022): Interval response to therapy with decreasing size of mediastinal lymph nodes.  No new disease. - Consolidation durvalumab started on 02/01/2022   2. Social/family history: - Seen today with her Sister [name].  Patient lives by herself.  Quit smoking on 02/22/2021.  Smoked 2 packs/day for 40 years. - Mother died of ovarian cancer.  Brother died of colon cancer at age 68.  Sister had melanoma.  Paternal uncles had stomach cancer and lung cancer.  Paternal aunt had thyroid cancer.  Another paternal aunt had  pancreatic cancer.   3.  Supraglottic squamous cell carcinoma (T1N1): - Definitive radiation from 08/16/2021 through 09/13/2021    Plan: Stage III squamous cell lung cancer: - She does not report any immunotherapy related side effects. - Reviewed CT chest from 01/25/2023: Stable postradiation changes of the chest with no evidence of local recurrence.  Stable mild subpleural consolidation of the right middle lobe. - Reviewed labs today: Normal LFTs and creatinine.  CBC grossly normal. - Proceed with Imfinzi today and in 2 weeks.  This will conclude 1 year of Imfinzi. - We discussed role of surveillance with CT scans every 3 to 6 months depending on the findings.  I will see her back in 3 months with repeat CT chest.  2.  Supraglottic squamous cell carcinoma (T1N1): - CT soft tissue neck on 08/03/2022 with no evidence of recurrence. - I have stressed on following up with Dr. Jenne Pane periodically. - I will arrange for CT soft tissue neck prior to next visit in 3 months.  3.  Throat pain and sternal chest pain: - Continue hydrocodone liquid 15 mL every 6 hours as needed.  4.  Hypokalemia: - Continue potassium 20 mill colons daily.  Potassium is 3.9.  5.  Hypothyroidism: - Continue Synthroid 100 mcg daily.  TSH today 7.5, up from 4.047.  Will closely monitor.    Orders Placed This Encounter  Procedures   CT Chest W Contrast    Standing Status:   Future    Standing Expiration Date:   01/31/2024    Order Specific Question:   If indicated for the ordered procedure, I authorize the administration of contrast media per Radiology protocol    Answer:   Yes    Order Specific Question:   Does the patient  have a contrast media/X-ray dye allergy?    Answer:   No    Order Specific Question:   Preferred imaging location?    Answer:   [facility]   CT SOFT TISSUE NECK W CONTRAST    Standing Status:   Future    Standing Expiration Date:   01/31/2024    Order Specific Question:   If indicated for  the ordered procedure, I authorize the administration of contrast media per Radiology protocol    Answer:   Yes    Order Specific Question:   Does the patient have a contrast media/X-ray dye allergy?    Answer:   No    Order Specific Question:   Preferred imaging location?    Answer:   [facility]   CBC with Differential    Standing Status:   Future    Standing Expiration Date:   01/31/2024   Comprehensive metabolic panel    Standing Status:   Future    Standing Expiration Date:   01/31/2024   Magnesium    Standing Status:   Future    Standing Expiration Date:   01/31/2024   TSH    Standing Status:   Future    Standing Expiration Date:   01/31/2024     Mikeal Hawthorne R Teague,acting as a scribe for Doreatha Massed, MD.,have documented all relevant documentation on the behalf of Doreatha Massed, MD,as directed by  Doreatha Massed, MD while in the presence of Doreatha Massed, MD.  I, Doreatha Massed MD, have reviewed the above documentation for accuracy and completeness, and I agree with the above.    Doreatha Massed, MD   7/9/20246:40 PM  CHIEF COMPLAINT:   Diagnosis: stage III squamous cell lung cancer    Cancer Staging  Carcinoma of upper-outer quadrant of left female breast [facility]) Staging form: Breast, AJCC 7th Edition - Clinical stage from 07/01/2014: Stage IIA (T2, N0, M0) - Unsigned - Pathologic stage from 08/20/2014: Stage IIB (T2, N1a, cM0) - Unsigned  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HCC) Staging form: Larynx - Supraglottis, AJCC 8th Edition - Clinical stage from 07/30/2021: cT1, cN1 - Unsigned  Squamous cell lung cancer (HCC) Staging form: Lung, AJCC 8th Edition - Clinical stage from 09/08/2021: Stage IIIB (cT1b, cN3, cM0) - Signed by Lonie Peak, MD on 09/08/2021    Prior Therapy: none  Current Therapy:  Durvalumab q14d    HISTORY OF PRESENT ILLNESS:   Oncology History  Carcinoma of upper-outer quadrant of left female breast (HCC)   06/24/2014 Mammogram   Left breast: 6 x 3 x 2.5 cm area of ill-defined increased density in the UOQ,  corresponding to the mass felt by the patient, not in the same location of the previously biopsied benign calcifications.    06/24/2014 Breast US   Left breast: ill-defined predominantly hypoechoic area with some increased echogenicity in the 2 o'clock position of the left breast, 7 cm from the nipple. 2.6 x 2.3 x 1.8 cm in maximum dimensions.   07/01/2014 Initial Biopsy   Left breast core needle bx: Invasive mammary carcinoma with lobular features, ER+ (100%), PR+ (91%), HER2/neu negative (ratio 1.09), Ki-67 32%. E-cadherin strongly positive, diagnostic for IDC    07/01/2014 Clinical Stage   Stage IIA: T2 N0   08/20/2014 Definitive Surgery   Left breast seed localized lumpectomy/SLNB: IDC, +LVI, DCIS, 1 LN removed and positive for metastatic carcinoma. Grade 2. HER2/neu repeated and negative (ratio 0.69-1.9).    08/20/2014 Pathologic Stage   Stage IIB:  pT2 pN1a pMx   09/17/2014 Surgery   Port-a cath placement and re-excision   10/02/2014 - 01/15/2015 Chemotherapy   CMF x 6 cycles.  patient refused Taxane-containing chemotherapy.   03/07/2015 Procedure   Comp Cancer Panel reveals no variant at APC, ATM, AXIN2, BARD1, BMPR1A, BRCA1, BRCA2, BRIP1, CDH1, CDK4, CDKN2A, CHEK2, FANCC, MLH1, MSH2, MSH6, MUTYH, NBN, PALB2, PMS2, POLD1, POLE, PTEN, RAD51C, RAD51D, SMAD4, STK11, TP53, VHL, and XRCC2.    04/06/2015 - 05/21/2015 Radiation Therapy   Adjuvant RT Michell Heinrich): Left breast/ 45 Gy over 25 fractions.   Left supraclavicular fossa and axilla/ 45 Gy over 25 fractions. Left breast boost/ 16 Gy over 8 fractions. Total dose: 61 Gy   06/04/2015 - 08/07/2015 Anti-estrogen oral therapy   Exemestane 25 mg daily.  Planned duration of therapy 5 years.    07/23/2015 Survivorship   Survivorship care plan completed and mailed to patient in lieu of in person visit   08/06/2015 Adverse Reaction   Severe hot  flashes and mood swings   08/08/2015 - 12/07/2015 Anti-estrogen oral therapy   Arimidex   12/04/2015 Adverse Reaction   Worsening depression, patient stopped Arimidex   12/07/2015 -  Anti-estrogen oral therapy   Tamoxifen   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HCC)  07/30/2021 Initial Diagnosis   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HCC) P 16 positive cT1N1   08/16/2021 - 09/13/2021 Radiation Therapy   Completed definitive radiation   09/30/2021 Pathology Results   [facility] Pathology PDL 1 29%   Squamous cell lung cancer (HCC)  07/23/2021 Imaging    PET/CT  demonstrated small right glottic lesion is mildly hypermetabolic and consistent with no neoplasm.  PET also demonstrated borderline right level 2 lymph node left supraclavicular, mediastinal and hilar lymphadenopathy and hypermetabolic left lower lobe pulmonary nodule all suspicious of metastatic focus.  No abdominal pelvic metastatic disease or osseous metastatic disease was appreciated.   08/31/2021 Initial Diagnosis   Squamous cell carcinoma of lung (HCC)   09/08/2021 Cancer Staging   Staging form: Lung, AJCC 8th Edition - Clinical stage from 09/08/2021: Stage IIIB (cT1b, cN3, cM0) - Signed by Lonie Peak, MD on 09/08/2021 Stage prefix: Initial diagnosis   09/30/2021 Pathology Results   [facility] Pathology PDL 1 29%   11/01/2021 - 11/23/2021 Chemotherapy   Patient is on Treatment Plan : LUNG Carboplatin / Paclitaxel + XRT q7d     02/01/2022 - 03/15/2022 Chemotherapy   Patient is on Treatment Plan : LUNG Durvalumab q14d     02/01/2022 -  Chemotherapy   Patient is on Treatment Plan : LUNG Durvalumab (10) q14d        INTERVAL HISTORY:   Regina Mcmahon is a 72 y.o. female presenting to clinic today for follow up of stage III squamous cell lung cancer. She was last seen by me on 12/15/22.  Today, she states that she is doing well overall. Her appetite level is at 100%. Her energy level is at 75%.  PAST MEDICAL HISTORY:   Past  Medical History: Past Medical History:  Diagnosis Date   Arthritis    Asthma    Asymptomatic varicose veins    Breast cancer (HCC) 06/2014   left   Cancer (HCC)    Supra Glottis   Chronic airway obstruction (HCC)    Complication of anesthesia 2016   difficulty waking up, Oxygen dropped low.   COPD (chronic obstructive pulmonary disease) (HCC)    Depression    Dyspnea    GERD (gastroesophageal reflux disease)  Hemorrhage rectum    and anus.   Hypertension    Insomnia    Other symptoms involving digestive system(787.99)    Pain    Hx.pain in joint involving pelvic region and thigh; pain in limb   Palpitations    Panic attacks    Personal history of chemotherapy 2016   Personal history of radiation therapy 2016   Pneumonia    Pre-diabetes    Prediabetes    Sinusitis    Symptomatic menopausal or female climacteric states    Varicose veins of both lower extremities with pain     Surgical History: Past Surgical History:  Procedure Laterality Date   BREAST BIOPSY Left 10/2012   BREAST BIOPSY Left 07/01/2014   malignant   BREAST LUMPECTOMY Left 08/20/2014   BRONCHIAL BIOPSY  08/23/2021   Procedure: BRONCHIAL BIOPSIES;  Surgeon: Leslye Peer, MD;  Location: [facility] END[facility]COPY;  Service: Pulmonary;;   BRONCHIAL BRUSHINGS  08/23/2021   Procedure: BRONCHIAL BRUSHINGS;  Surgeon: Leslye Peer, MD;  Location: [facility]COPY;  Service: Pulmonary;;   BRONCHIAL NEEDLE ASPIRATION BIOPSY  08/23/2021   Procedure: BRONCHIAL NEEDLE ASPIRATION BIOPSIES;  Surgeon: Leslye Peer, MD;  Location: [facility];  Service: Pulmonary;;   BRONCHIAL WASHINGS  08/23/2021   Procedure: BRONCHIAL WASHINGS;  Surgeon: Leslye Peer, MD;  Location: [facility];  Service: Pulmonary;;   CESAREAN SECTION     CHOLECYSTECTOMY  1985   COLONOSCOPY  2002   Dr. Cleotis Nipper: internal hemorrhoids, one small rectal polyp, adenomatous path    COLONOSCOPY N/A 11/23/2015   Procedure: COLONOSCOPY;  Surgeon: West Bali, MD;  Location: [facility];  Service: Endoscopy;  Laterality: N/A;  1100 - moved to 10:45 - office to notify   ESOPHAGOGASTRODUODENOSCOPY N/A 11/23/2015   Procedure: ESOPHAGOGASTRODUODENOSCOPY (EGD);  Surgeon: West Bali, MD;  Location: [facility];  Service: Endoscopy;  Laterality: N/A;   IR IMAGING GUIDED PORT INSERTION  11/08/2021   PORT-A-CATH REMOVAL  02/2015   PORTACATH PLACEMENT Right 09/17/2014   Procedure: INSERTION PORT-A-CATH WITH ULTRASOUND;  Surgeon: Harriette Bouillon, MD;  Location: [facility];  Service: General;  Laterality: Right;  IJ   RADIOACTIVE SEED GUIDED PARTIAL MASTECTOMY WITH AXILLARY SENTINEL LYMPH NODE BIOPSY Left 08/20/2014   Procedure: LEFT BREAST LUMPECTOMY WITH RADIOACTIVE SEED LOCALIZATION AND SENTINEL LYMPH NODE MAPPING;  Surgeon: Harriette Bouillon, MD;  Location: [facility];  Service: General;  Laterality: Left;   RE-EXCISION OF BREAST LUMPECTOMY Left 09/17/2014   Procedure: RE-EXCISION OF BREAST LUMPECTOMY;  Surgeon: Harriette Bouillon, MD;  Location: [facility];  Service: General;  Laterality: Left;   TOTAL HIP ARTHROPLASTY Right 02/22/2021   Procedure: RIGHT TOTAL HIP ARTHROPLASTY ANTERIOR APPROACH;  Surgeon: Eldred Manges, MD;  Location: [facility] OR;  Service: Orthopedics;  Laterality: Right;   TUBAL LIGATION     VIDEO BRONCHOSCOPY WITH ENDOBRONCHIAL ULTRASOUND N/A 08/23/2021   Procedure: VIDEO BRONCHOSCOPY WITH ENDOBRONCHIAL ULTRASOUND;  Surgeon: Leslye Peer, MD;  Location: [facility];  Service: Pulmonary;  Laterality: N/A;   VIDEO BRONCHOSCOPY WITH RADIAL ENDOBRONCHIAL ULTRASOUND  08/23/2021   Procedure: VIDEO BRONCHOSCOPY WITH RADIAL ENDOBRONCHIAL ULTRASOUND;  Surgeon: Leslye Peer, MD;  Location: [facility];  Service: Pulmonary;;    Social History: Social History   Socioeconomic History   Marital status: Divorced    Spouse name: Not on file   Number of children: 2   Years of education: Not on file    Highest education level: Not  on file  Occupational History   Not on file  Tobacco Use   Smoking status: Former    Packs/day: 1.00    Years: 42.00    Additional pack years: 0.00    Total pack years: 42.00    Types: Cigarettes    Quit date: 02/22/2021    Years since quitting: 1.9   Smokeless tobacco: Never  Vaping Use   Vaping Use: Never used  Substance and Sexual Activity   Alcohol use: No    Alcohol/week: 0.0 standard drinks of alcohol   Drug use: No   Sexual activity: Never    Birth control/protection: None  Other Topics Concern   Not on file  Social History Narrative   Not on file   Social Determinants of Health   Financial Resource Strain: Not on file  Food Insecurity: Not on file  Transportation Needs: Not on file  Physical Activity: Not on file  Stress: Not on file  Social Connections: Unknown (08/05/2021)   Social Connection and Isolation Panel [NHANES]    Frequency of Communication with Friends and Family: More than three times a week    Frequency of Social Gatherings with Friends and Family: More than three times a week    Attends Religious Services: Not on Marketing executive or Organizations: Not on file    Attends Banker Meetings: Not on file    Marital Status: Not on file  Intimate Partner Violence: Not on file    Family History: Family History  Problem Relation Age of Onset   Diabetes Mother    Ovarian cancer Mother 53   Other Father        died in MVA at age 52   Other Sister        mitral valve disorder   Melanoma Sister 22       removed from back of leg   Colon cancer Brother        early 37s, succumbed to disease   Cancer Paternal Aunt        leukemia, bone, or lung cancer   Alzheimer's disease Maternal Grandmother    Heart attack Maternal Grandfather    Heart attack Paternal Grandmother    COPD Paternal Grandfather    Emphysema Paternal Grandfather    Congestive Heart Failure Paternal Aunt    Stomach cancer  Paternal Uncle    Kidney cancer Maternal Uncle    Cancer Cousin        dx. teens/dx. 40s   Cancer Cousin    Colon cancer Cousin     Current Medications:  Current Outpatient Medications:    albuterol (VENTOLIN HFA) 108 (90 Base) MCG/ACT inhaler, Inhale 2 puffs into the lungs every 4 (four) hours as needed., Disp: , Rfl:    ALPRAZolam (XANAX) 0.5 MG tablet, Take 0.5 mg by mouth 3 (three) times daily., Disp: , Rfl:    amoxicillin-clavulanate (AUGMENTIN) 875-125 MG tablet, Take 1 tablet by mouth 2 (two) times daily., Disp: 14 tablet, Rfl: 0   ANORO ELLIPTA 62.5-25 MCG/INH AEPB, Inhale 1 puff into the lungs daily., Disp: , Rfl:    calcium carbonate (TUMS - DOSED IN MG ELEMENTAL CALCIUM) 500 MG chewable tablet, Chew 2 tablets by mouth 3 (three) times daily as needed for indigestion or heartburn., Disp: , Rfl:    fluconazole (DIFLUCAN) 100 MG tablet, Take 2 tablets today, then 1 tablet daily x 20 more days., Disp: 22 tablet, Rfl: 0   furosemide (LASIX)  20 MG tablet, Take 20 mg by mouth daily., Disp: , Rfl:    hydrochlorothiazide (HYDRODIURIL) 12.5 MG tablet, Take 12.5 mg by mouth daily., Disp: , Rfl:    ibuprofen (ADVIL) 100 MG/5ML suspension, Take 10 mLs (200 mg total) by mouth every 4 (four) hours as needed for moderate pain., Disp: 473 mL, Rfl: 0   levothyroxine (SYNTHROID) 100 MCG tablet, TAKE 1 TABLET BY MOUTH ONCE DAILY BEFORE BREAKFAST, Disp: 30 tablet, Rfl: 2   lidocaine (XYLOCAINE) 2 % solution, Patient: Mix 1part 2% viscous lidocaine, 1part H20. Swallow 10mL of diluted mixture, before meals and at bedtime, up to QID for sore throat., Disp: 200 mL, Rfl: 3   lidocaine-prilocaine (EMLA) cream, APPLY CREAM TOPICALLY AS NEEDED, Disp: 30 g, Rfl: 3   Menthol, Topical Analgesic, (BIOFREEZE EX), Apply 1 application topically daily as needed (pain)., Disp: , Rfl:    nystatin (MYCOSTATIN) 100000 UNIT/ML suspension, Take 5 mLs (500,000 Units total) by mouth 4 (four) times daily., Disp: 60 mL,  Rfl: 0   ondansetron (ZOFRAN) 8 MG tablet, Take 1 tablet (8 mg total) by mouth every 8 (eight) hours as needed for nausea or vomiting., Disp: 60 tablet, Rfl: 0   pantoprazole (PROTONIX) 40 MG tablet, Take 1 tablet by mouth once daily, Disp: 90 tablet, Rfl: 0   polyethylene glycol (MIRALAX / GLYCOLAX) 17 g packet, Take 17 g by mouth daily., Disp: , Rfl:    potassium chloride (KLOR-CON) 20 MEQ packet, Take 20 mEq by mouth 2 (two) times daily. Dissolve in at least 5 ounces cold water or other beverage and take after meals, Disp: 60 packet, Rfl: 6   prochlorperazine (COMPAZINE) 10 MG tablet, Take 1 tablet (10 mg total) by mouth every 6 (six) hours as needed for nausea or vomiting., Disp: 60 tablet, Rfl: 0   sertraline (ZOLOFT) 100 MG tablet, Take 100 mg by mouth daily., Disp: , Rfl:    sucralfate (CARAFATE) 1 g tablet, Dissolve 1 tablet in 10 mL H20 and swallow 30 min prior to meals and bedtime., Disp: 40 tablet, Rfl: 3   Vitamin D, Ergocalciferol, 50000 units CAPS, Take 1 capsule by mouth once a week., Disp: , Rfl:    HYDROcodone-acetaminophen (HYCET) 7.5-325 mg/15 ml solution, Take 15 mLs by mouth every 6 (six) hours as needed for moderate pain., Disp: 473 mL, Rfl: 0 No current facility-administered medications for this visit.  Facility-Administered Medications Ordered in Other Visits:    sodium chloride flush (NS) 0.9 % injection 10 mL, 10 mL, Intracatheter, PRN, Doreatha Massed, MD, 10 mL at 01/31/23 1406   Allergies: No Known Allergies  REVIEW OF SYSTEMS:   Review of Systems  Constitutional:  Positive for fatigue. Negative for chills and fever.  HENT:   Positive for trouble swallowing. Negative for lump/mass, mouth sores, nosebleeds and sore throat.   Eyes:  Negative for eye problems.  Respiratory:  Positive for cough and shortness of breath.   Cardiovascular:  Negative for chest pain, leg swelling and palpitations.  Gastrointestinal:  Positive for constipation. Negative for abdominal  pain, diarrhea, nausea and vomiting.  Genitourinary:  Negative for bladder incontinence, difficulty urinating, dysuria, frequency, hematuria and nocturia.   Musculoskeletal:  Negative for arthralgias, back pain, flank pain, myalgias and neck pain.  Skin:  Negative for itching and rash.  Neurological:  Positive for headaches. Negative for dizziness and numbness.  Hematological:  Does not bruise/bleed easily.  Psychiatric/Behavioral:  Negative for depression, sleep disturbance and suicidal ideas. The patient is not  nervous/anxious.   All other systems reviewed and are negative.    VITALS:   Blood pressure 135/87, pulse 66, temperature 98 F (36.7 C), resp. rate 18, weight 203 lb 8 oz (92.3 kg), SpO2 95 %.  Wt Readings from Last 3 Encounters:  01/31/23 203 lb 8 oz (92.3 kg)  01/16/23 201 lb (91.2 kg)  12/29/22 195 lb 12.8 oz (88.8 kg)    Body mass index is 39.74 kg/m.  Performance status (ECOG): 1 - Symptomatic but completely ambulatory  PHYSICAL EXAM:   Physical Exam Vitals and nursing note reviewed. Exam conducted with a chaperone present.  Constitutional:      Appearance: Normal appearance.  Cardiovascular:     Rate and Rhythm: Normal rate and regular rhythm.     Pulses: Normal pulses.     Heart sounds: Normal heart sounds.  Pulmonary:     Effort: Pulmonary effort is normal.     Breath sounds: Normal breath sounds.  Abdominal:     Palpations: Abdomen is soft. There is no hepatomegaly, splenomegaly or mass.     Tenderness: There is no abdominal tenderness.  Musculoskeletal:     Right lower leg: No edema.     Left lower leg: No edema.  Lymphadenopathy:     Cervical: No cervical adenopathy.     Right cervical: No superficial, deep or posterior cervical adenopathy.    Left cervical: No superficial, deep or posterior cervical adenopathy.     Upper Body:     Right upper body: No supraclavicular or axillary adenopathy.     Left upper body: No supraclavicular or axillary  adenopathy.  Neurological:     General: No focal deficit present.     Mental Status: She is alert and oriented to person, place, and time.  Psychiatric:        Mood and Affect: Mood normal.        Behavior: Behavior normal.     LABS:      Latest Ref Rng & Units 01/25/2023   11:20 AM 01/16/2023   12:17 PM 12/29/2022   11:53 AM  CBC  WBC 4.0 - 10.5 K/uL 5.1  5.2  6.4   Hemoglobin 12.0 - 15.0 g/dL 16.1  09.6  04.5   Hematocrit 36.0 - 46.0 % 40.8  42.0  43.8   Platelets 150 - 400 K/uL 172  186  197       Latest Ref Rng & Units 01/25/2023   11:20 AM 01/16/2023   12:17 PM 12/29/2022   11:53 AM  CMP  Glucose 70 - 99 mg/dL 409  99  811   BUN 8 - 23 mg/dL 19  14  18    Creatinine 0.44 - 1.00 mg/dL 9.14  7.82  9.56   Sodium 135 - 145 mmol/L 137  134  135   Potassium 3.5 - 5.1 mmol/L 3.9  3.8  3.9   Chloride 98 - 111 mmol/L 108  103  102   CO2 22 - 32 mmol/L 23  25  25    Calcium 8.9 - 10.3 mg/dL 9.4  9.0  9.4   Total Protein 6.5 - 8.1 g/dL 6.7  6.8  7.4   Total Bilirubin 0.3 - 1.2 mg/dL 1.1  1.5  2.1   Alkaline Phos 38 - 126 U/L 64  63  70   AST 15 - 41 U/L 13  13  15    ALT 0 - 44 U/L 11  10  10       Lab  Results  Component Value Date   CEA1 4.53 11/01/2021   /  CEA ([facility])  Date Value Ref Range Status  11/01/2021 4.53 0.00 - 5.00 ng/mL Final    Comment:    (NOTE) This test was performed using Architect's Chemiluminescent Microparticle Immunoassay. Values obtained from different assay methods cannot be used interchangeably. Please note that 5-10% of patients who smoke may see CEA levels up to 6.9 ng/mL. Performed at [facility], [address], [place], [place] [address]    No results found for: "PSA1" No results found for: "CAN199" Lab Results  Component Value Date   CAN125 18.9 08/10/2021    No results found for: "TOTALPROTELP", "ALBUMINELP", "A1GS", "A2GS", "BETS", "BETA2SER", "GAMS", "MSPIKE", "SPEI" No results found for: "TIBC",  "FERRITIN", "IRONPCTSAT" No results found for: "LDH"   STUDIES:   CT Chest W Contrast  Result Date: 01/30/2023 CLINICAL DATA:  Non-small cell lung cancer; * Tracking Code: BO * EXAM: CT CHEST WITH CONTRAST TECHNIQUE: Multidetector CT imaging of the chest was performed during intravenous contrast administration. RADIATION DOSE REDUCTION: This exam was performed according to the departmental dose-optimization program which includes automated exposure control, adjustment of the mA and/or kV according to patient size and/or use of iterative reconstruction technique. CONTRAST:  75mL OMNIPAQUE IOHEXOL 300 MG/ML  SOLN COMPARISON:  Multiple priors, most recent CT chest, abdomen and pelvis dated October 26, 2022 FINDINGS: Cardiovascular: Normal heart size. Stable small pericardial effusion. Normal caliber thoracic aorta with mild atherosclerotic disease. Severe coronary artery calcifications. Right chest wall port with tip in the SVC. Mediastinum/Nodes: Esophagus is unremarkable. Right thyroid nodule, unchanged when compared with prior exam. No enlarged lymph nodes seen in the chest. Lungs/Pleura: Central airways are patent. Bilateral perihilar postradiation change, stable when compared with the prior exam. Stable subpleural consolidation of the right middle lobe measuring 19 x 16 mm on series 4, image 101 moderate centrilobular emphysema. Stable solid nodule of the left lower lobe measuring 4 mm on series 4, image 37. No new or enlarging pulmonary nodules. Trace left pleural effusion, unchanged when compared with the prior exam. Upper Abdomen: No acute abnormality. Musculoskeletal: Postsurgical changes of the left breast. Lucent and sclerotic lesion of the T5 vertebral body with mild compression deformity, unchanged when compared with the prior exam. No new osseous lesions. IMPRESSION: 1. Stable postradiation changes of the chest. No evidence of local recurrence. 2. Stable mild subpleural consolidation of the right  middle lobe. 3. No evidence of progressive metastatic disease in the chest. 4. Aortic Atherosclerosis (ICD10-I70.0) and Emphysema (ICD10-J43.9). Electronically Signed   By: Allegra Lai M.D.   On: 01/30/2023 09:29

## 2023-01-31 NOTE — Patient Instructions (Signed)
[facility] at [facility] Discharge Instructions   You were seen and examined today by Dr. Katragadda.  He reviewed the results of your lab work which are normal/stable.   We will proceed with your treatment today.  Return as scheduled.    Thank you for choosing [facility] to provide your oncology and hematology care.  To afford each patient quality time with our provider, please arrive at least 15 minutes before your scheduled appointment time.   If you have a lab appointment with the Cancer Center please come in thru the Main Entrance and check in at the main information desk.  You need to re-schedule your appointment should you arrive 10 or more minutes late.  We strive to give you quality time with our providers, and arriving late affects you and other patients whose appointments are after yours.  Also, if you no show three or more times for appointments you may be dismissed from the clinic at the providers discretion.     Again, thank you for choosing [facility].  Our hope is that these requests will decrease the amount of time that you wait before being seen by our physicians.       _____________________________________________________________  Should you have questions after your visit to  Cancer Center, please contact our office at [phone] and follow the prompts.  Our office hours are 8:00 a.m. and 4:30 p.m. Monday - Friday.  Please note that voicemails left after 4:00 p.m. may not be returned until the following business day.  We are closed weekends and major holidays.  You do have access to a nurse 24-7, just call the main number to the clinic [phone] and do not press any options, hold on the line and a nurse will answer the phone.    For prescription refill requests, have your pharmacy contact our office and allow 72 hours.    Due to Covid, you will need to wear a mask upon entering  the hospital. If you do not have a mask, a mask will be given to you at the Main Entrance upon arrival. For doctor visits, patients may have 1 support person age 18 or older with them. For treatment visits, patients can not have anyone with them due to social distancing guidelines and our immunocompromised population.      

## 2023-02-01 ENCOUNTER — Ambulatory Visit: Payer: Medicare HMO

## 2023-02-01 ENCOUNTER — Other Ambulatory Visit: Payer: Self-pay

## 2023-02-06 ENCOUNTER — Ambulatory Visit (HOSPITAL_COMMUNITY)
Admission: RE | Admit: 2023-02-06 | Discharge: 2023-02-06 | Disposition: A | Discharge status: Home / Self Care | Payer: Medicare HMO | Source: Ambulatory Visit | Attending: Internal Medicine | Admitting: Internal Medicine

## 2023-02-06 DIAGNOSIS — Z1231 Encounter for screening mammogram for malignant neoplasm of breast: Secondary | ICD-10-CM | POA: Insufficient documentation

## 2023-02-08 ENCOUNTER — Other Ambulatory Visit (HOSPITAL_COMMUNITY): Payer: Self-pay | Admitting: Internal Medicine

## 2023-02-08 DIAGNOSIS — C50919 Malignant neoplasm of unspecified site of unspecified female breast: Secondary | ICD-10-CM | POA: Diagnosis not present

## 2023-02-08 DIAGNOSIS — F419 Anxiety disorder, unspecified: Secondary | ICD-10-CM | POA: Diagnosis not present

## 2023-02-08 DIAGNOSIS — N6310 Unspecified lump in the right breast, unspecified quadrant: Secondary | ICD-10-CM | POA: Diagnosis not present

## 2023-02-08 DIAGNOSIS — Z79899 Other long term (current) drug therapy: Secondary | ICD-10-CM | POA: Diagnosis not present

## 2023-02-08 DIAGNOSIS — C140 Malignant neoplasm of pharynx, unspecified: Secondary | ICD-10-CM | POA: Diagnosis not present

## 2023-02-08 DIAGNOSIS — I10 Essential (primary) hypertension: Secondary | ICD-10-CM | POA: Diagnosis not present

## 2023-02-08 DIAGNOSIS — Z85110 Personal history of malignant carcinoid tumor of bronchus and lung: Secondary | ICD-10-CM | POA: Diagnosis not present

## 2023-02-08 DIAGNOSIS — C50912 Malignant neoplasm of unspecified site of left female breast: Secondary | ICD-10-CM | POA: Diagnosis not present

## 2023-02-08 DIAGNOSIS — R928 Other abnormal and inconclusive findings on diagnostic imaging of breast: Secondary | ICD-10-CM

## 2023-02-09 ENCOUNTER — Other Ambulatory Visit: Payer: Self-pay

## 2023-02-09 DIAGNOSIS — C3490 Malignant neoplasm of unspecified part of unspecified bronchus or lung: Secondary | ICD-10-CM

## 2023-02-14 ENCOUNTER — Inpatient Hospital Stay: Payer: Medicare HMO

## 2023-02-14 VITALS — BP 128/83 | HR 78 | Temp 97.5°F | Resp 20 | Wt 202.6 lb

## 2023-02-14 DIAGNOSIS — R072 Precordial pain: Secondary | ICD-10-CM | POA: Diagnosis not present

## 2023-02-14 DIAGNOSIS — I700 Atherosclerosis of aorta: Secondary | ICD-10-CM | POA: Diagnosis not present

## 2023-02-14 DIAGNOSIS — Z800 Family history of malignant neoplasm of digestive organs: Secondary | ICD-10-CM | POA: Diagnosis not present

## 2023-02-14 DIAGNOSIS — Z5112 Encounter for antineoplastic immunotherapy: Secondary | ICD-10-CM | POA: Diagnosis not present

## 2023-02-14 DIAGNOSIS — R070 Pain in throat: Secondary | ICD-10-CM | POA: Diagnosis not present

## 2023-02-14 DIAGNOSIS — I3139 Other pericardial effusion (noninflammatory): Secondary | ICD-10-CM | POA: Diagnosis not present

## 2023-02-14 DIAGNOSIS — J432 Centrilobular emphysema: Secondary | ICD-10-CM | POA: Diagnosis not present

## 2023-02-14 DIAGNOSIS — Z79811 Long term (current) use of aromatase inhibitors: Secondary | ICD-10-CM | POA: Diagnosis not present

## 2023-02-14 DIAGNOSIS — E876 Hypokalemia: Secondary | ICD-10-CM | POA: Diagnosis not present

## 2023-02-14 DIAGNOSIS — C3490 Malignant neoplasm of unspecified part of unspecified bronchus or lung: Secondary | ICD-10-CM

## 2023-02-14 DIAGNOSIS — Z9049 Acquired absence of other specified parts of digestive tract: Secondary | ICD-10-CM | POA: Diagnosis not present

## 2023-02-14 DIAGNOSIS — Z923 Personal history of irradiation: Secondary | ICD-10-CM | POA: Diagnosis not present

## 2023-02-14 DIAGNOSIS — J90 Pleural effusion, not elsewhere classified: Secondary | ICD-10-CM | POA: Diagnosis not present

## 2023-02-14 DIAGNOSIS — Z79620 Long term (current) use of immunosuppressive biologic: Secondary | ICD-10-CM | POA: Diagnosis not present

## 2023-02-14 DIAGNOSIS — C3432 Malignant neoplasm of lower lobe, left bronchus or lung: Secondary | ICD-10-CM | POA: Diagnosis not present

## 2023-02-14 DIAGNOSIS — M4854XA Collapsed vertebra, not elsewhere classified, thoracic region, initial encounter for fracture: Secondary | ICD-10-CM | POA: Diagnosis not present

## 2023-02-14 DIAGNOSIS — E041 Nontoxic single thyroid nodule: Secondary | ICD-10-CM | POA: Diagnosis not present

## 2023-02-14 DIAGNOSIS — C321 Malignant neoplasm of supraglottis: Secondary | ICD-10-CM | POA: Diagnosis not present

## 2023-02-14 DIAGNOSIS — E039 Hypothyroidism, unspecified: Secondary | ICD-10-CM | POA: Diagnosis not present

## 2023-02-14 DIAGNOSIS — Z79890 Hormone replacement therapy (postmenopausal): Secondary | ICD-10-CM | POA: Diagnosis not present

## 2023-02-14 DIAGNOSIS — Z8719 Personal history of other diseases of the digestive system: Secondary | ICD-10-CM | POA: Diagnosis not present

## 2023-02-14 DIAGNOSIS — Z95828 Presence of other vascular implants and grafts: Secondary | ICD-10-CM

## 2023-02-14 DIAGNOSIS — Z853 Personal history of malignant neoplasm of breast: Secondary | ICD-10-CM | POA: Diagnosis not present

## 2023-02-14 DIAGNOSIS — Z79899 Other long term (current) drug therapy: Secondary | ICD-10-CM | POA: Diagnosis not present

## 2023-02-14 DIAGNOSIS — Z87891 Personal history of nicotine dependence: Secondary | ICD-10-CM | POA: Diagnosis not present

## 2023-02-14 DIAGNOSIS — D696 Thrombocytopenia, unspecified: Secondary | ICD-10-CM | POA: Diagnosis not present

## 2023-02-14 LAB — CBC WITH DIFFERENTIAL/PLATELET
Abs Immature Granulocytes: 0.01 10*3/uL (ref 0.00–0.07)
Basophils Absolute: 0 10*3/uL (ref 0.0–0.1)
Basophils Relative: 1 %
Eosinophils Absolute: 0.2 10*3/uL (ref 0.0–0.5)
Eosinophils Relative: 4 %
HCT: 42.9 % (ref 36.0–46.0)
Hemoglobin: 14.4 g/dL (ref 12.0–15.0)
Immature Granulocytes: 0 %
Lymphocytes Relative: 17 %
Lymphs Abs: 0.9 10*3/uL (ref 0.7–4.0)
MCH: 29.9 pg (ref 26.0–34.0)
MCHC: 33.6 g/dL (ref 30.0–36.0)
MCV: 89.2 fL (ref 80.0–100.0)
Monocytes Absolute: 0.4 10*3/uL (ref 0.1–1.0)
Monocytes Relative: 7 %
Neutro Abs: 3.7 10*3/uL (ref 1.7–7.7)
Neutrophils Relative %: 71 %
Platelets: 212 10*3/uL (ref 150–400)
RBC: 4.81 MIL/uL (ref 3.87–5.11)
RDW: 12.5 % (ref 11.5–15.5)
WBC: 5.3 10*3/uL (ref 4.0–10.5)
nRBC: 0 % (ref 0.0–0.2)

## 2023-02-14 LAB — COMPREHENSIVE METABOLIC PANEL
ALT: 11 U/L (ref 0–44)
AST: 15 U/L (ref 15–41)
Albumin: 3.8 g/dL (ref 3.5–5.0)
Alkaline Phosphatase: 68 U/L (ref 38–126)
Anion gap: 9 (ref 5–15)
BUN: 17 mg/dL (ref 8–23)
CO2: 24 mmol/L (ref 22–32)
Calcium: 9.3 mg/dL (ref 8.9–10.3)
Chloride: 101 mmol/L (ref 98–111)
Creatinine, Ser: 0.82 mg/dL (ref 0.44–1.00)
GFR, Estimated: >60 mL/min (ref >60)
Glucose, Bld: 108 mg/dL — ABNORMAL HIGH (ref 70–99)
Potassium: 4 mmol/L (ref 3.5–5.1)
Sodium: 134 mmol/L — ABNORMAL LOW (ref 135–145)
Total Bilirubin: 1.6 mg/dL — ABNORMAL HIGH (ref 0.3–1.2)
Total Protein: 7.5 g/dL (ref 6.5–8.1)

## 2023-02-14 LAB — MAGNESIUM: Magnesium: 2.1 mg/dL (ref 1.7–2.4)

## 2023-02-14 LAB — TSH: TSH: 3.941 u[IU]/mL (ref 0.350–4.500)

## 2023-02-14 MED ORDER — SODIUM CHLORIDE 0.9 % IV SOLN
10.0000 mg/kg | Freq: Once | INTRAVENOUS | Status: AC
  Administered 2023-02-14: 860 mg via INTRAVENOUS
  Filled 2023-02-14: qty 10

## 2023-02-14 MED ORDER — HEPARIN SOD (PORK) LOCK FLUSH 100 UNIT/ML IV SOLN
500.0000 [IU] | Freq: Once | INTRAVENOUS | Status: AC | PRN
  Administered 2023-02-14: 500 [IU]

## 2023-02-14 MED ORDER — SODIUM CHLORIDE 0.9% FLUSH
10.0000 mL | INTRAVENOUS | Status: DC | PRN
  Administered 2023-02-14: 10 mL

## 2023-02-14 MED ORDER — SODIUM CHLORIDE 0.9% FLUSH
10.0000 mL | INTRAVENOUS | Status: DC | PRN
  Administered 2023-02-14: 10 mL via INTRAVENOUS

## 2023-02-14 MED ORDER — SODIUM CHLORIDE 0.9 % IV SOLN: Freq: Once | INTRAVENOUS | Status: AC

## 2023-02-14 NOTE — Progress Notes (Signed)
Patients port flushed without difficulty.  Good blood return noted with no bruising or swelling noted at site.  VSS. Patient remains accessed for treatment.  

## 2023-02-14 NOTE — Progress Notes (Signed)
Patient tolerated therapy with no complaints voiced.  Side effects with management reviewed with understanding verbalized.  Port site clean and dry with no bruising or swelling noted at site.  Good blood return noted before and after administration of therapy.  Band aid applied.  Patient left in satisfactory condition with VSS and no s/s of distress noted.  

## 2023-02-14 NOTE — Patient Instructions (Signed)
[facility]  Discharge Instructions: Thank you for choosing [facility] to provide your oncology and hematology care.  If you have a lab appointment with the Cancer Center - please note that after April 8th, 2024, all labs will be drawn in the cancer center.  You do not have to check in or register with the main entrance as you have in the past but will complete your check-in in the cancer center.  Wear comfortable clothing and clothing appropriate for easy access to any Portacath or PICC line.   We strive to give you quality time with your provider. You may need to reschedule your appointment if you arrive late (15 or more minutes).  Arriving late affects you and other patients whose appointments are after yours.  Also, if you miss three or more appointments without notifying the office, you may be dismissed from the clinic at the provider's discretion.      For prescription refill requests, have your pharmacy contact our office and allow 72 hours for refills to be completed.    Today you received the following chemotherapy and/or immunotherapy agents imfinzi   To help prevent nausea and vomiting after your treatment, we encourage you to take your nausea medication as directed.  BELOW ARE SYMPTOMS THAT SHOULD BE REPORTED IMMEDIATELY: *FEVER GREATER THAN 100.4 F (38 C) OR HIGHER *CHILLS OR SWEATING *NAUSEA AND VOMITING THAT IS NOT CONTROLLED WITH YOUR NAUSEA MEDICATION *UNUSUAL SHORTNESS OF BREATH *UNUSUAL BRUISING OR BLEEDING *URINARY PROBLEMS (pain or burning when urinating, or frequent urination) *BOWEL PROBLEMS (unusual diarrhea, constipation, pain near the anus) TENDERNESS IN MOUTH AND THROAT WITH OR WITHOUT PRESENCE OF ULCERS (sore throat, sores in mouth, or a toothache) UNUSUAL RASH, SWELLING OR PAIN  UNUSUAL VAGINAL DISCHARGE OR ITCHING   Items with * indicate a potential emergency and should be followed up as soon as possible or go to the  Emergency Department if any problems should occur.  Please show the CHEMOTHERAPY ALERT CARD or IMMUNOTHERAPY ALERT CARD at check-in to the Emergency Department and triage nurse.  Should you have questions after your visit or need to cancel or reschedule your appointment, please contact [facility] AT [facility] [phone]  and follow the prompts.  Office hours are 8:00 a.m. to 4:30 p.m. Monday - Friday. Please note that voicemails left after 4:00 p.m. may not be returned until the following business day.  We are closed weekends and major holidays. You have access to a nurse at all times for urgent questions. Please call the main number to the clinic [phone] and follow the prompts.  For any non-urgent questions, you may also contact your provider using MyChart. We now offer e-Visits for anyone 67 and older to request care online for non-urgent symptoms. For details visit [email or web address].   Also download the MyChart app! Go to the app store, search "MyChart", open the app, select [place], and log in with your MyChart username and password.

## 2023-02-17 ENCOUNTER — Other Ambulatory Visit: Payer: Self-pay

## 2023-02-17 MED ORDER — HYDROCODONE-ACETAMINOPHEN 7.5-325 MG/15ML PO SOLN: 15.0000 mL | Freq: Four times a day (QID) | ORAL | 0 refills | Status: DC | PRN

## 2023-02-23 ENCOUNTER — Ambulatory Visit (HOSPITAL_COMMUNITY)
Admission: RE | Admit: 2023-02-23 | Discharge: 2023-02-23 | Disposition: A | Discharge status: Home / Self Care | Payer: Medicare HMO | Source: Ambulatory Visit | Attending: Internal Medicine | Admitting: Internal Medicine

## 2023-02-23 DIAGNOSIS — R928 Other abnormal and inconclusive findings on diagnostic imaging of breast: Secondary | ICD-10-CM | POA: Insufficient documentation

## 2023-03-02 ENCOUNTER — Other Ambulatory Visit: Payer: Self-pay | Admitting: *Deleted

## 2023-03-02 MED ORDER — HYDROCODONE-ACETAMINOPHEN 7.5-325 MG/15ML PO SOLN: 15.0000 mL | Freq: Four times a day (QID) | ORAL | 0 refills | Status: DC | PRN

## 2023-03-14 ENCOUNTER — Other Ambulatory Visit: Payer: Self-pay | Admitting: *Deleted

## 2023-03-14 MED ORDER — HYDROCODONE-ACETAMINOPHEN 7.5-325 MG/15ML PO SOLN: 15.0000 mL | Freq: Four times a day (QID) | ORAL | 0 refills | Status: DC | PRN

## 2023-03-22 DIAGNOSIS — Z8521 Personal history of malignant neoplasm of larynx: Secondary | ICD-10-CM | POA: Diagnosis not present

## 2023-03-22 DIAGNOSIS — R221 Localized swelling, mass and lump, neck: Secondary | ICD-10-CM | POA: Diagnosis not present

## 2023-03-24 ENCOUNTER — Other Ambulatory Visit: Payer: Self-pay

## 2023-03-24 MED ORDER — HYDROCODONE-ACETAMINOPHEN 7.5-325 MG/15ML PO SOLN: 15.0000 mL | Freq: Four times a day (QID) | ORAL | 0 refills | Status: DC | PRN

## 2023-04-03 ENCOUNTER — Other Ambulatory Visit: Payer: Self-pay | Admitting: *Deleted

## 2023-04-03 ENCOUNTER — Telehealth: Payer: Self-pay | Admitting: *Deleted

## 2023-04-03 MED ORDER — HYDROCODONE-ACETAMINOPHEN 7.5-325 MG/15ML PO SOLN: 15.0000 mL | Freq: Four times a day (QID) | ORAL | 0 refills | Status: DC | PRN

## 2023-04-03 NOTE — Telephone Encounter (Signed)
Patient's sister [name] called to advise that patient is having difficulty breathing and increased difficulty swallowing with a productive cough, not associated with fever.  Has been progressing over the past week.  Advised to present to the ER for evaluation.

## 2023-04-13 ENCOUNTER — Other Ambulatory Visit: Payer: Self-pay | Admitting: *Deleted

## 2023-04-17 ENCOUNTER — Other Ambulatory Visit: Payer: Self-pay

## 2023-04-17 MED ORDER — HYDROCODONE-ACETAMINOPHEN 7.5-325 MG/15ML PO SOLN: 15.0000 mL | Freq: Four times a day (QID) | ORAL | 0 refills | Status: DC | PRN

## 2023-04-25 ENCOUNTER — Other Ambulatory Visit: Payer: Self-pay | Admitting: *Deleted

## 2023-04-25 MED ORDER — HYDROCODONE-ACETAMINOPHEN 7.5-325 MG/15ML PO SOLN: 15.0000 mL | Freq: Four times a day (QID) | ORAL | 0 refills | Status: DC | PRN

## 2023-04-27 ENCOUNTER — Encounter: Payer: Self-pay | Admitting: Hematology

## 2023-04-27 ENCOUNTER — Encounter: Payer: Self-pay | Admitting: Hematology and Oncology

## 2023-04-28 ENCOUNTER — Encounter: Payer: Self-pay | Admitting: Hematology and Oncology

## 2023-04-28 ENCOUNTER — Encounter: Payer: Self-pay | Admitting: Hematology

## 2023-04-29 ENCOUNTER — Other Ambulatory Visit: Payer: Self-pay | Admitting: Hematology

## 2023-05-01 ENCOUNTER — Encounter: Payer: Self-pay | Admitting: Hematology and Oncology

## 2023-05-01 ENCOUNTER — Encounter: Payer: Self-pay | Admitting: Hematology

## 2023-05-01 ENCOUNTER — Inpatient Hospital Stay: Payer: 59 | Attending: Hematology and Oncology

## 2023-05-01 ENCOUNTER — Ambulatory Visit (HOSPITAL_COMMUNITY)
Admission: RE | Admit: 2023-05-01 | Discharge: 2023-05-01 | Disposition: A | Discharge status: Home / Self Care | Payer: 59 | Source: Ambulatory Visit | Attending: Hematology | Admitting: Hematology

## 2023-05-01 VITALS — BP 117/73 | HR 84 | Temp 98.7°F | Resp 20

## 2023-05-01 DIAGNOSIS — F32A Depression, unspecified: Secondary | ICD-10-CM | POA: Insufficient documentation

## 2023-05-01 DIAGNOSIS — C321 Malignant neoplasm of supraglottis: Secondary | ICD-10-CM | POA: Insufficient documentation

## 2023-05-01 DIAGNOSIS — Z8719 Personal history of other diseases of the digestive system: Secondary | ICD-10-CM | POA: Diagnosis not present

## 2023-05-01 DIAGNOSIS — N6489 Other specified disorders of breast: Secondary | ICD-10-CM | POA: Insufficient documentation

## 2023-05-01 DIAGNOSIS — C3432 Malignant neoplasm of lower lobe, left bronchus or lung: Secondary | ICD-10-CM | POA: Diagnosis not present

## 2023-05-01 DIAGNOSIS — E876 Hypokalemia: Secondary | ICD-10-CM | POA: Diagnosis not present

## 2023-05-01 DIAGNOSIS — I3139 Other pericardial effusion (noninflammatory): Secondary | ICD-10-CM | POA: Insufficient documentation

## 2023-05-01 DIAGNOSIS — Z79811 Long term (current) use of aromatase inhibitors: Secondary | ICD-10-CM | POA: Insufficient documentation

## 2023-05-01 DIAGNOSIS — R591 Generalized enlarged lymph nodes: Secondary | ICD-10-CM | POA: Diagnosis not present

## 2023-05-01 DIAGNOSIS — R070 Pain in throat: Secondary | ICD-10-CM | POA: Diagnosis not present

## 2023-05-01 DIAGNOSIS — I700 Atherosclerosis of aorta: Secondary | ICD-10-CM | POA: Insufficient documentation

## 2023-05-01 DIAGNOSIS — Z923 Personal history of irradiation: Secondary | ICD-10-CM | POA: Insufficient documentation

## 2023-05-01 DIAGNOSIS — Z8249 Family history of ischemic heart disease and other diseases of the circulatory system: Secondary | ICD-10-CM | POA: Insufficient documentation

## 2023-05-01 DIAGNOSIS — R072 Precordial pain: Secondary | ICD-10-CM | POA: Insufficient documentation

## 2023-05-01 DIAGNOSIS — M8440XA Pathological fracture, unspecified site, initial encounter for fracture: Secondary | ICD-10-CM | POA: Diagnosis not present

## 2023-05-01 DIAGNOSIS — Z825 Family history of asthma and other chronic lower respiratory diseases: Secondary | ICD-10-CM | POA: Insufficient documentation

## 2023-05-01 DIAGNOSIS — Z8041 Family history of malignant neoplasm of ovary: Secondary | ICD-10-CM | POA: Insufficient documentation

## 2023-05-01 DIAGNOSIS — E041 Nontoxic single thyroid nodule: Secondary | ICD-10-CM | POA: Insufficient documentation

## 2023-05-01 DIAGNOSIS — Z79899 Other long term (current) drug therapy: Secondary | ICD-10-CM | POA: Insufficient documentation

## 2023-05-01 DIAGNOSIS — C50412 Malignant neoplasm of upper-outer quadrant of left female breast: Secondary | ICD-10-CM | POA: Diagnosis not present

## 2023-05-01 DIAGNOSIS — E039 Hypothyroidism, unspecified: Secondary | ICD-10-CM | POA: Diagnosis not present

## 2023-05-01 DIAGNOSIS — Z1721 Progesterone receptor positive status: Secondary | ICD-10-CM | POA: Diagnosis not present

## 2023-05-01 DIAGNOSIS — I6523 Occlusion and stenosis of bilateral carotid arteries: Secondary | ICD-10-CM | POA: Diagnosis not present

## 2023-05-01 DIAGNOSIS — Z833 Family history of diabetes mellitus: Secondary | ICD-10-CM | POA: Insufficient documentation

## 2023-05-01 DIAGNOSIS — Z9221 Personal history of antineoplastic chemotherapy: Secondary | ICD-10-CM | POA: Insufficient documentation

## 2023-05-01 DIAGNOSIS — R29890 Loss of height: Secondary | ICD-10-CM | POA: Diagnosis not present

## 2023-05-01 DIAGNOSIS — M47814 Spondylosis without myelopathy or radiculopathy, thoracic region: Secondary | ICD-10-CM | POA: Insufficient documentation

## 2023-05-01 DIAGNOSIS — D696 Thrombocytopenia, unspecified: Secondary | ICD-10-CM | POA: Diagnosis not present

## 2023-05-01 DIAGNOSIS — Z79890 Hormone replacement therapy (postmenopausal): Secondary | ICD-10-CM | POA: Insufficient documentation

## 2023-05-01 DIAGNOSIS — Z806 Family history of leukemia: Secondary | ICD-10-CM | POA: Insufficient documentation

## 2023-05-01 DIAGNOSIS — Z860101 Personal history of adenomatous and serrated colon polyps: Secondary | ICD-10-CM | POA: Insufficient documentation

## 2023-05-01 DIAGNOSIS — N951 Menopausal and female climacteric states: Secondary | ICD-10-CM | POA: Insufficient documentation

## 2023-05-01 DIAGNOSIS — Z801 Family history of malignant neoplasm of trachea, bronchus and lung: Secondary | ICD-10-CM | POA: Insufficient documentation

## 2023-05-01 DIAGNOSIS — Z8051 Family history of malignant neoplasm of kidney: Secondary | ICD-10-CM | POA: Insufficient documentation

## 2023-05-01 DIAGNOSIS — Z800 Family history of malignant neoplasm of digestive organs: Secondary | ICD-10-CM | POA: Insufficient documentation

## 2023-05-01 DIAGNOSIS — Z5112 Encounter for antineoplastic immunotherapy: Secondary | ICD-10-CM | POA: Diagnosis present

## 2023-05-01 DIAGNOSIS — C3490 Malignant neoplasm of unspecified part of unspecified bronchus or lung: Secondary | ICD-10-CM | POA: Insufficient documentation

## 2023-05-01 DIAGNOSIS — J432 Centrilobular emphysema: Secondary | ICD-10-CM | POA: Insufficient documentation

## 2023-05-01 DIAGNOSIS — Z95828 Presence of other vascular implants and grafts: Secondary | ICD-10-CM

## 2023-05-01 DIAGNOSIS — Z170 Estrogen receptor positive status [ER+]: Secondary | ICD-10-CM | POA: Diagnosis not present

## 2023-05-01 DIAGNOSIS — Z9012 Acquired absence of left breast and nipple: Secondary | ICD-10-CM | POA: Insufficient documentation

## 2023-05-01 DIAGNOSIS — Z9049 Acquired absence of other specified parts of digestive tract: Secondary | ICD-10-CM | POA: Insufficient documentation

## 2023-05-01 DIAGNOSIS — Z87891 Personal history of nicotine dependence: Secondary | ICD-10-CM | POA: Insufficient documentation

## 2023-05-01 LAB — CBC WITH DIFFERENTIAL/PLATELET
Abs Immature Granulocytes: 0.02 10*3/uL (ref 0.00–0.07)
Basophils Absolute: 0 10*3/uL (ref 0.0–0.1)
Basophils Relative: 1 %
Eosinophils Absolute: 0.2 10*3/uL (ref 0.0–0.5)
Eosinophils Relative: 4 %
HCT: 44.3 % (ref 36.0–46.0)
Hemoglobin: 15.2 g/dL — ABNORMAL HIGH (ref 12.0–15.0)
Immature Granulocytes: 0 %
Lymphocytes Relative: 16 %
Lymphs Abs: 1 10*3/uL (ref 0.7–4.0)
MCH: 30.6 pg (ref 26.0–34.0)
MCHC: 34.3 g/dL (ref 30.0–36.0)
MCV: 89.1 fL (ref 80.0–100.0)
Monocytes Absolute: 0.3 10*3/uL (ref 0.1–1.0)
Monocytes Relative: 5 %
Neutro Abs: 4.6 10*3/uL (ref 1.7–7.7)
Neutrophils Relative %: 74 %
Platelets: 223 10*3/uL (ref 150–400)
RBC: 4.97 MIL/uL (ref 3.87–5.11)
RDW: 12.6 % (ref 11.5–15.5)
WBC: 6.2 10*3/uL (ref 4.0–10.5)
nRBC: 0 % (ref 0.0–0.2)

## 2023-05-01 LAB — TSH: TSH: 9.585 u[IU]/mL — ABNORMAL HIGH (ref 0.350–4.500)

## 2023-05-01 LAB — COMPREHENSIVE METABOLIC PANEL
ALT: 13 U/L (ref 0–44)
AST: 15 U/L (ref 15–41)
Albumin: 3.9 g/dL (ref 3.5–5.0)
Alkaline Phosphatase: 67 U/L (ref 38–126)
Anion gap: 9 (ref 5–15)
BUN: 17 mg/dL (ref 8–23)
CO2: 25 mmol/L (ref 22–32)
Calcium: 9.1 mg/dL (ref 8.9–10.3)
Chloride: 101 mmol/L (ref 98–111)
Creatinine, Ser: 0.94 mg/dL (ref 0.44–1.00)
GFR, Estimated: >60 mL/min (ref >60)
Glucose, Bld: 111 mg/dL — ABNORMAL HIGH (ref 70–99)
Potassium: 3.8 mmol/L (ref 3.5–5.1)
Sodium: 135 mmol/L (ref 135–145)
Total Bilirubin: 2.1 mg/dL — ABNORMAL HIGH (ref 0.3–1.2)
Total Protein: 7.4 g/dL (ref 6.5–8.1)

## 2023-05-01 LAB — MAGNESIUM: Magnesium: 2.2 mg/dL (ref 1.7–2.4)

## 2023-05-01 MED ORDER — HEPARIN SOD (PORK) LOCK FLUSH 100 UNIT/ML IV SOLN
500.0000 [IU] | Freq: Once | INTRAVENOUS | Status: AC
  Administered 2023-05-01: 500 [IU] via INTRAVENOUS

## 2023-05-01 MED ORDER — SODIUM CHLORIDE 0.9% FLUSH
10.0000 mL | INTRAVENOUS | Status: DC | PRN
  Administered 2023-05-01: 10 mL via INTRAVENOUS

## 2023-05-01 MED ORDER — IOHEXOL 300 MG/ML  SOLN
100.0000 mL | Freq: Once | INTRAMUSCULAR | Status: AC | PRN
  Administered 2023-05-01: 100 mL via INTRAVENOUS

## 2023-05-01 NOTE — Progress Notes (Signed)
Patients port flushed without difficulty.  Good blood return noted with no bruising or swelling noted at site.  Patient remains accessed for CT scan.  °

## 2023-05-05 ENCOUNTER — Other Ambulatory Visit: Payer: Self-pay

## 2023-05-05 MED ORDER — HYDROCODONE-ACETAMINOPHEN 7.5-325 MG/15ML PO SOLN: 15.0000 mL | Freq: Four times a day (QID) | ORAL | 0 refills | Status: DC | PRN

## 2023-05-08 ENCOUNTER — Encounter: Payer: Self-pay | Admitting: Hematology

## 2023-05-08 ENCOUNTER — Inpatient Hospital Stay (HOSPITAL_BASED_OUTPATIENT_CLINIC_OR_DEPARTMENT_OTHER): Payer: 59 | Admitting: Hematology

## 2023-05-08 VITALS — BP 120/77 | HR 94 | Temp 98.7°F | Resp 18 | Wt 207.0 lb

## 2023-05-08 DIAGNOSIS — E039 Hypothyroidism, unspecified: Secondary | ICD-10-CM | POA: Diagnosis not present

## 2023-05-08 DIAGNOSIS — J432 Centrilobular emphysema: Secondary | ICD-10-CM | POA: Diagnosis not present

## 2023-05-08 DIAGNOSIS — Z8719 Personal history of other diseases of the digestive system: Secondary | ICD-10-CM | POA: Diagnosis not present

## 2023-05-08 DIAGNOSIS — R072 Precordial pain: Secondary | ICD-10-CM | POA: Diagnosis not present

## 2023-05-08 DIAGNOSIS — C3432 Malignant neoplasm of lower lobe, left bronchus or lung: Secondary | ICD-10-CM | POA: Diagnosis not present

## 2023-05-08 DIAGNOSIS — R070 Pain in throat: Secondary | ICD-10-CM | POA: Diagnosis not present

## 2023-05-08 DIAGNOSIS — C321 Malignant neoplasm of supraglottis: Secondary | ICD-10-CM

## 2023-05-08 DIAGNOSIS — I700 Atherosclerosis of aorta: Secondary | ICD-10-CM | POA: Diagnosis not present

## 2023-05-08 DIAGNOSIS — N6489 Other specified disorders of breast: Secondary | ICD-10-CM | POA: Diagnosis not present

## 2023-05-08 DIAGNOSIS — E041 Nontoxic single thyroid nodule: Secondary | ICD-10-CM | POA: Diagnosis not present

## 2023-05-08 DIAGNOSIS — C3490 Malignant neoplasm of unspecified part of unspecified bronchus or lung: Secondary | ICD-10-CM | POA: Diagnosis not present

## 2023-05-08 DIAGNOSIS — D696 Thrombocytopenia, unspecified: Secondary | ICD-10-CM | POA: Diagnosis not present

## 2023-05-08 DIAGNOSIS — M47814 Spondylosis without myelopathy or radiculopathy, thoracic region: Secondary | ICD-10-CM | POA: Diagnosis not present

## 2023-05-08 DIAGNOSIS — I6523 Occlusion and stenosis of bilateral carotid arteries: Secondary | ICD-10-CM | POA: Diagnosis not present

## 2023-05-08 DIAGNOSIS — C50412 Malignant neoplasm of upper-outer quadrant of left female breast: Secondary | ICD-10-CM | POA: Diagnosis not present

## 2023-05-08 DIAGNOSIS — Z1721 Progesterone receptor positive status: Secondary | ICD-10-CM | POA: Diagnosis not present

## 2023-05-08 DIAGNOSIS — I3139 Other pericardial effusion (noninflammatory): Secondary | ICD-10-CM | POA: Diagnosis not present

## 2023-05-08 DIAGNOSIS — Z79811 Long term (current) use of aromatase inhibitors: Secondary | ICD-10-CM | POA: Diagnosis not present

## 2023-05-08 DIAGNOSIS — Z79899 Other long term (current) drug therapy: Secondary | ICD-10-CM | POA: Diagnosis not present

## 2023-05-08 DIAGNOSIS — Z860101 Personal history of adenomatous and serrated colon polyps: Secondary | ICD-10-CM | POA: Diagnosis not present

## 2023-05-08 DIAGNOSIS — E876 Hypokalemia: Secondary | ICD-10-CM | POA: Diagnosis not present

## 2023-05-08 DIAGNOSIS — Z170 Estrogen receptor positive status [ER+]: Secondary | ICD-10-CM | POA: Diagnosis not present

## 2023-05-08 NOTE — Progress Notes (Unsigned)
Irish Lack, MD  Leodis Rains D  PROCEDURE / BIOPSY REVIEW Date: 05/08/23  Requested Biopsy site: Right supraclavicular lymph node Reason for request: Biopsy Imaging review: Best seen on CT chest 10/7  Decision: Approved Imaging modality to perform: Ultrasound Schedule with: Moderate Sedation Schedule for: Any VIR  Additional comments: @Schedulers .  Please contact me with questions, concerns, or if issue pertaining to this request arise.  Reola Calkins, MD [facility]

## 2023-05-08 NOTE — Patient Instructions (Addendum)
[facility]  Discharge Instructions  You were seen and examined today by Dr. Ellin Saba.  Dr. Ellin Saba discussed your most recent lab work and CT scan which revealed enlarged lymph nodes in the chest and one under the right collar bone.  Dr. Ellin Saba recommends a PET scan, brain MRI and a biopsy of the lymph node.  Follow-up as scheduled.  Thank you for choosing [facility] to provide your oncology and hematology care.   To afford each patient quality time with our provider, please arrive at least 15 minutes before your scheduled appointment time. You may need to reschedule your appointment if you arrive late (10 or more minutes). Arriving late affects you and other patients whose appointments are after yours.  Also, if you miss three or more appointments without notifying the office, you may be dismissed from the clinic at the provider's discretion.    Again, thank you for choosing [facility].  Our hope is that these requests will decrease the amount of time that you wait before being seen by our physicians.   If you have a lab appointment with the Cancer Center - please note that after April 8th, all labs will be drawn in the cancer center.  You do not have to check in or register with the main entrance as you have in the past but will complete your check-in at the cancer center.            _____________________________________________________________  Should you have questions after your visit to [facility], please contact our office at [phone] and follow the prompts.  Our office hours are 8:00 a.m. to 4:30 p.m. Monday - Thursday and 8:00 a.m. to 2:30 p.m. Friday.  Please note that voicemails left after 4:00 p.m. may not be returned until the following business day.  We are closed weekends and all major holidays.  You do have access to a nurse 24-7, just call the main number to the clinic  [phone] and do not press any options, hold on the line and a nurse will answer the phone.    For prescription refill requests, have your pharmacy contact our office and allow 72 hours.    Masks are no longer required in the cancer centers. If you would like for your care team to wear a mask while they are taking care of you, please let them know. You may have one support person who is at least 70 years old accompany you for your appointments.

## 2023-05-08 NOTE — Progress Notes (Signed)
[facility] [address] [place] [address]    Clinic Day:  05/08/2023  Referring physician: Benita Stabile, MD  Patient Care Team: Benita Stabile, MD as PCP - General (Internal Medicine) Laqueta Linden, MD (Inactive) as PCP - Cardiology (Cardiology) Harriette Bouillon, MD as Consulting Physician (General Surgery) Lurline Hare, MD as Consulting Physician (Radiation Oncology) Salomon Fick, NP as Nurse Practitioner (Hematology and Oncology) West Bali, MD (Inactive) as Consulting Physician (Gastroenterology) Doreatha Massed, MD as Medical Oncologist (Medical Oncology) Malmfelt, Lise Auer, RN as Oncology Nurse Navigator Christia Reading, MD as Consulting Physician (Otolaryngology) Lonie Peak, MD as Consulting Physician (Radiation Oncology) [name], MD as Consulting Physician (Hematology and Oncology) Therese Sarah, RN as Oncology Nurse Navigator (Medical Oncology)   ASSESSMENT & PLAN:   Assessment: 1. Stage III squamous cell cancer: - LLL lung mass FNA (08/23/2021): Malignant cells consistent with squamous cell carcinoma. - Lymph node 4R and 7: Malignant cells consistent with NSCLC. - Chemoradiation therapy with weekly carboplatin and paclitaxel from 11/01/2021, week for chemotherapy on 11/23/2021.  XRT completed on 12/16/2021. - She missed further doses of chemotherapy secondary to thrombocytopenia. - PET scan (01/27/2022): Interval response to therapy with decreasing size of mediastinal lymph nodes.  No new disease. - Consolidation durvalumab from 02/01/2022 through 02/14/2023   2. Social/family history: - Seen today with her Sister [name].  Patient lives by herself.  Quit smoking on 02/22/2021.  Smoked 2 packs/day for 40 years. - Mother died of ovarian cancer.  Brother died of colon cancer at age 17.  Sister had melanoma.  Paternal uncles had stomach cancer and lung cancer.  Paternal aunt had thyroid cancer.  Another paternal  aunt had pancreatic cancer.   3.  Supraglottic squamous cell carcinoma (T1N1): - Definitive radiation from 08/16/2021 through 09/13/2021    Plan: Stage III squamous cell lung cancer: - She does not have any signs or symptoms of recurrence. - CT chest (05/01/2023): Interval enlargement of previously seen small mediastinal lymph nodes.  Right supraclavicular lymph node measures 3.7 x 2 cm.  Left superior mediastinal node measures 1.1 cm.  Additional small hilar and mediastinal lymph nodes have enlarged.  No axillary adenopathy.  Subpleural right middle lobe nodular density measures 1.7 x 1.4 cm.  Possible new 3 mm right lower lobe nodule.  Stable three 6 mm left upper lobe nodule. - She reportedly fell last week as she developed dizziness. - Recommend restaging PET CT scan as well as brain MRI with and without contrast. - Recommend right supraclavicular lymph node biopsy, ultrasound-guided by IR.  We have also discussed therapy options including best supportive care in the form of hospice. - RTC after biopsy to discuss further plan.  2.  Supraglottic squamous cell carcinoma (T1N1): - CT soft tissue neck from 05/01/2023 reviewed by me shows postradiation changes at the glottis and supraglottic larynx without evidence of residual or recurrent tumor.  No significant cervical adenopathy.  Enlarging 2.8 cm right thyroid nodule. - Will consider thyroid ultrasound in the future.  She had a previous biopsy which was benign.  3.  Throat pain and sternal chest pain: - Continue hydrocodone liquid 15 mL every 6 hours as needed.  4.  Hypokalemia: - Continue potassium 20 mEq daily.  Potassium today is normal.  5.  Hypothyroidism: - She stopped taking Synthroid for couple of months.  After she noticed her TSH was high at 9.5, she started back at 100 mcg daily.  Orders Placed This Encounter  Procedures   NM PET Image Restag (PS) Skull Base To Thigh    Standing Status:   Future    Standing Expiration  Date:   05/07/2024    Order Specific Question:   If indicated for the ordered procedure, I authorize the administration of a radiopharmaceutical per Radiology protocol    Answer:   Yes    Order Specific Question:   Preferred imaging location?    Answer:   [facility]    Order Specific Question:   Release to patient    Answer:   Immediate   MR Brain W Wo Contrast    Standing Status:   Future    Standing Expiration Date:   05/07/2024    Order Specific Question:   If indicated for the ordered procedure, I authorize the administration of contrast media per Radiology protocol    Answer:   Yes    Order Specific Question:   What is the patient's sedation requirement?    Answer:   No Sedation    Order Specific Question:   Does the patient have a pacemaker or implanted devices?    Answer:   No    Order Specific Question:   Use SRS Protocol?    Answer:   No    Order Specific Question:   Preferred imaging location?    Answer:   [facility] (table limit [phone])    Order Specific Question:   Release to patient    Answer:   Immediate   [place] CORE BIOPSY (LYMPH NODES)    Standing Status:   Future    Standing Expiration Date:   05/07/2024    Order Specific Question:   Lab orders requested (DO NOT place separate lab orders, these will be automatically ordered during procedure specimen collection):    Answer:   Surgical Pathology    Order Specific Question:   Reason for Exam (SYMPTOM  OR DIAGNOSIS REQUIRED)    Answer:   R Supraclavicular Lymphadenopathy    Order Specific Question:   Preferred location?    Answer:   [facility]    Order Specific Question:   Release to patient    Answer:   Immediate      I,[name],acting as a scribe for Doreatha Massed, MD.,have documented all relevant documentation on the behalf of Doreatha Massed, MD,as directed by  Doreatha Massed, MD while in the presence of Doreatha Massed, MD.   I, Doreatha Massed MD, have  reviewed the above documentation for accuracy and completeness, and I agree with the above.   Doreatha Massed, MD   10/14/20241:10 PM  CHIEF COMPLAINT:   Diagnosis: stage III squamous cell lung cancer    Cancer Staging  Carcinoma of upper-outer quadrant of left female breast [facility]) Staging form: Breast, AJCC 7th Edition - Clinical stage from 07/01/2014: Stage IIA (T2, N0, M0) - Unsigned - Pathologic stage from 08/20/2014: Stage IIB (T2, N1a, cM0) - Unsigned  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HCC) Staging form: Larynx - Supraglottis, AJCC 8th Edition - Clinical stage from 07/30/2021: cT1, cN1 - Unsigned  Squamous cell lung cancer (HCC) Staging form: Lung, AJCC 8th Edition - Clinical stage from 09/08/2021: Stage IIIB (cT1b, cN3, cM0) - Signed by Lonie Peak, MD on 09/08/2021    Prior Therapy: Chemoradiation therapy with weekly carboplatin and paclitaxel from 11/01/2021, week for chemotherapy on 11/23/2021.  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    06/24/2014 Breast US   Left breast: ill-defined predominantly hypoechoic area with some increased echogenicity in the 2 o'clock position of the left breast, 7 cm from the nipple. 2.6 x 2.3 x 1.8 cm in maximum dimensions.   07/01/2014 Initial Biopsy   Left breast core needle bx: Invasive mammary carcinoma with lobular features, ER+ (100%), PR+ (91%), HER2/neu negative (ratio 1.09), Ki-67 32%. E-cadherin strongly positive, diagnostic for IDC    07/01/2014 Clinical Stage   Stage IIA: T2 N0   08/20/2014 Definitive Surgery   Left breast seed localized lumpectomy/SLNB: IDC, +LVI, DCIS,  1 LN removed and positive for metastatic carcinoma. Grade 2. HER2/neu repeated and negative (ratio 0.69-1.9).    08/20/2014 Pathologic Stage   Stage IIB: pT2 pN1a pMx   09/17/2014 Surgery   Port-a cath placement and re-excision   10/02/2014 - 01/15/2015 Chemotherapy   CMF x 6 cycles.  patient refused Taxane-containing chemotherapy.   03/07/2015 Procedure   Comp Cancer Panel reveals no variant at APC, ATM, AXIN2, BARD1, BMPR1A, BRCA1, BRCA2, BRIP1, CDH1, CDK4, CDKN2A, CHEK2, FANCC, MLH1, MSH2, MSH6, MUTYH, NBN, PALB2, PMS2, POLD1, POLE, PTEN, RAD51C, RAD51D, SMAD4, STK11, TP53, VHL, and XRCC2.    04/06/2015 - 05/21/2015 Radiation Therapy   Adjuvant RT Michell Heinrich): Left breast/ 45 Gy over 25 fractions.   Left supraclavicular fossa and axilla/ 45 Gy over 25 fractions. Left breast boost/ 16 Gy over 8 fractions. Total dose: 61 Gy   06/04/2015 - 08/07/2015 Anti-estrogen oral therapy   Exemestane 25 mg daily.  Planned duration of therapy 5 years.    07/23/2015 Survivorship   Survivorship care plan completed and mailed to patient in lieu of in person visit   08/06/2015 Adverse Reaction   Severe hot flashes and mood swings   08/08/2015 - 12/07/2015 Anti-estrogen oral therapy   Arimidex   12/04/2015 Adverse Reaction   Worsening depression, patient stopped Arimidex   12/07/2015 -  Anti-estrogen oral therapy   Tamoxifen   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HCC)  07/30/2021 Initial Diagnosis   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HCC) P 16 positive cT1N1   08/16/2021 - 09/13/2021 Radiation Therapy   Completed definitive radiation   09/30/2021 Pathology Results   [facility] Pathology PDL 1 29%   Squamous cell lung cancer (HCC)  07/23/2021 Imaging    PET/CT  demonstrated small right glottic lesion is mildly hypermetabolic and consistent with no neoplasm.  PET also demonstrated borderline right level 2 lymph node left supraclavicular, mediastinal and hilar lymphadenopathy and hypermetabolic left  lower lobe pulmonary nodule all suspicious of metastatic focus.  No abdominal pelvic metastatic disease or osseous metastatic disease was appreciated.   08/31/2021 Initial Diagnosis   Squamous cell carcinoma of lung (HCC)   09/08/2021 Cancer Staging   Staging form: Lung, AJCC 8th Edition - Clinical stage from 09/08/2021: Stage IIIB (cT1b, cN3, cM0) - Signed by Lonie Peak, MD on 09/08/2021 Stage prefix: Initial diagnosis   09/30/2021 Pathology Results   [facility] Pathology PDL 1 29%   11/01/2021 - 11/23/2021 Chemotherapy   Patient is on Treatment Plan : LUNG Carboplatin / Paclitaxel + XRT q7d     02/01/2022 - 03/15/2022 Chemotherapy  Patient is on Treatment Plan : LUNG Durvalumab q14d     02/01/2022 -  Chemotherapy   Patient is on Treatment Plan : LUNG Durvalumab (10) q14d        INTERVAL HISTORY:   Demiya is a 72 y.o. female presenting to clinic today for follow up of stage III squamous cell lung cancer. She was last seen by me on 01/31/23.  Since her last visit, she underwent restaging CT neck and chest on 05/01/23.   Today, she states that she is doing well overall. Her appetite level is at 100%. Her energy level is at 75%.  PAST MEDICAL HISTORY:   Past Medical History: Past Medical History:  Diagnosis Date   Arthritis    Asthma    Asymptomatic varicose veins    Breast cancer (HCC) 06/2014   left   Cancer (HCC)    Supra Glottis   Chronic airway obstruction (HCC)    Complication of anesthesia 2016   difficulty waking up, Oxygen dropped low.   COPD (chronic obstructive pulmonary disease) (HCC)    Depression    Dyspnea    GERD (gastroesophageal reflux disease)    Hemorrhage rectum    and anus.   Hypertension    Insomnia    Other symptoms involving digestive system(787.99)    Pain    Hx.pain in joint involving pelvic region and thigh; pain in limb   Palpitations    Panic attacks    Personal history of chemotherapy 2016   Personal history of radiation therapy 2016    Pneumonia    Pre-diabetes    Prediabetes    Sinusitis    Symptomatic menopausal or female climacteric states    Varicose veins of both lower extremities with pain     Surgical History: Past Surgical History:  Procedure Laterality Date   BREAST BIOPSY Left 10/2012   BREAST BIOPSY Left 07/01/2014   malignant   BREAST LUMPECTOMY Left 08/20/2014   BRONCHIAL BIOPSY  08/23/2021   Procedure: BRONCHIAL BIOPSIES;  Surgeon: Leslye Peer, MD;  Location: [facility];  Service: Pulmonary;;   BRONCHIAL BRUSHINGS  08/23/2021   Procedure: BRONCHIAL BRUSHINGS;  Surgeon: Leslye Peer, MD;  Location: [facility];  Service: Pulmonary;;   BRONCHIAL NEEDLE ASPIRATION BIOPSY  08/23/2021   Procedure: BRONCHIAL NEEDLE ASPIRATION BIOPSIES;  Surgeon: Leslye Peer, MD;  Location: [facility];  Service: Pulmonary;;   BRONCHIAL WASHINGS  08/23/2021   Procedure: BRONCHIAL WASHINGS;  Surgeon: Leslye Peer, MD;  Location: [facility];  Service: Pulmonary;;   CESAREAN SECTION     CHOLECYSTECTOMY  1985   COLONOSCOPY  2002   Dr. Cleotis Nipper: internal hemorrhoids, one small rectal polyp, adenomatous path    COLONOSCOPY N/A 11/23/2015   Procedure: COLONOSCOPY;  Surgeon: West Bali, MD;  Location: [facility];  Service: Endoscopy;  Laterality: N/A;  1100 - moved to 10:45 - office to notify   ESOPHAGOGASTRODUODENOSCOPY N/A 11/23/2015   Procedure: ESOPHAGOGASTRODUODENOSCOPY (EGD);  Surgeon: West Bali, MD;  Location: [facility];  Service: Endoscopy;  Laterality: N/A;   IR IMAGING GUIDED PORT INSERTION  11/08/2021   PORT-A-CATH REMOVAL  02/2015   PORTACATH PLACEMENT Right 09/17/2014   Procedure: INSERTION PORT-A-CATH WITH ULTRASOUND;  Surgeon: Harriette Bouillon, MD;  Location: [facility];  Service: General;  Laterality: Right;  IJ   RADIOACTIVE SEED GUIDED PARTIAL MASTECTOMY WITH AXILLARY SENTINEL LYMPH NODE BIOPSY Left 08/20/2014   Procedure: LEFT BREAST LUMPECTOMY WITH RADIOACTIVE SEED  LOCALIZATION AND SENTINEL  LYMPH NODE MAPPING;  Surgeon: Harriette Bouillon, MD;  Location: [facility];  Service: General;  Laterality: Left;   RE-EXCISION OF BREAST LUMPECTOMY Left 09/17/2014   Procedure: RE-EXCISION OF BREAST LUMPECTOMY;  Surgeon: Harriette Bouillon, MD;  Location: [facility];  Service: General;  Laterality: Left;   TOTAL HIP ARTHROPLASTY Right 02/22/2021   Procedure: RIGHT TOTAL HIP ARTHROPLASTY ANTERIOR APPROACH;  Surgeon: Eldred Manges, MD;  Location: [facility];  Service: Orthopedics;  Laterality: Right;   TUBAL LIGATION     VIDEO BRONCHOSCOPY WITH ENDOBRONCHIAL ULTRASOUND N/A 08/23/2021   Procedure: VIDEO BRONCHOSCOPY WITH ENDOBRONCHIAL ULTRASOUND;  Surgeon: Leslye Peer, MD;  Location: [facility];  Service: Pulmonary;  Laterality: N/A;   VIDEO BRONCHOSCOPY WITH RADIAL ENDOBRONCHIAL ULTRASOUND  08/23/2021   Procedure: VIDEO BRONCHOSCOPY WITH RADIAL ENDOBRONCHIAL ULTRASOUND;  Surgeon: Leslye Peer, MD;  Location: [facility];  Service: Pulmonary;;    Social History: Social History   Socioeconomic History   Marital status: Divorced    Spouse name: Not on file   Number of children: 2   Years of education: Not on file   Highest education level: Not on file  Occupational History   Not on file  Tobacco Use   Smoking status: Former    Current packs/day: 0.00    Average packs/day: 1 pack/day for 42.0 years (42.0 ttl pk-yrs)    Types: Cigarettes    Start date: 02/23/1979    Quit date: 02/22/2021    Years since quitting: 2.2   Smokeless tobacco: Never  Vaping Use   Vaping status: Never Used  Substance and Sexual Activity   Alcohol use: No    Alcohol/week: 0.0 standard drinks of alcohol   Drug use: No   Sexual activity: Never    Birth control/protection: None  Other Topics Concern   Not on file  Social History Narrative   Not on file   Social Determinants of Health   Financial Resource Strain: Not on file  Food Insecurity: Not on file   Transportation Needs: Not on file  Physical Activity: Not on file  Stress: Not on file  Social Connections: Unknown (08/05/2021)   Social Connection and Isolation Panel [NHANES]    Frequency of Communication with Friends and Family: More than three times a week    Frequency of Social Gatherings with Friends and Family: More than three times a week    Attends Religious Services: Not on Marketing executive or Organizations: Not on file    Attends Banker Meetings: Not on file    Marital Status: Not on file  Intimate Partner Violence: Not on file    Family History: Family History  Problem Relation Age of Onset   Diabetes Mother    Ovarian cancer Mother 40   Other Father        died in MVA at age 60   Other Sister        mitral valve disorder   Melanoma Sister 26       removed from back of leg   Colon cancer Brother        early 87s, succumbed to disease   Cancer Paternal Aunt        leukemia, bone, or lung cancer   Alzheimer's disease Maternal Grandmother    Heart attack Maternal Grandfather    Heart attack Paternal Grandmother    COPD Paternal Grandfather    Emphysema Paternal Grandfather    Congestive Heart  Failure Paternal Aunt    Stomach cancer Paternal Uncle    Kidney cancer Maternal Uncle    Cancer Cousin        dx. teens/dx. 40s   Cancer Cousin    Colon cancer Cousin     Current Medications:  Current Outpatient Medications:    albuterol (VENTOLIN HFA) 108 (90 Base) MCG/ACT inhaler, Inhale 2 puffs into the lungs every 4 (four) hours as needed., Disp: , Rfl:    ALPRAZolam (XANAX) 0.5 MG tablet, Take 0.5 mg by mouth 3 (three) times daily., Disp: , Rfl:    amoxicillin-clavulanate (AUGMENTIN) 875-125 MG tablet, Take 1 tablet by mouth 2 (two) times daily., Disp: 14 tablet, Rfl: 0   ANORO ELLIPTA 62.5-25 MCG/INH AEPB, Inhale 1 puff into the lungs daily., Disp: , Rfl:    calcium carbonate (TUMS - DOSED IN MG ELEMENTAL CALCIUM) 500 MG chewable  tablet, Chew 2 tablets by mouth 3 (three) times daily as needed for indigestion or heartburn., Disp: , Rfl:    fluconazole (DIFLUCAN) 100 MG tablet, Take 2 tablets today, then 1 tablet daily x 20 more days., Disp: 22 tablet, Rfl: 0   furosemide (LASIX) 20 MG tablet, Take 20 mg by mouth daily., Disp: , Rfl:    hydrochlorothiazide (HYDRODIURIL) 12.5 MG tablet, Take 12.5 mg by mouth daily., Disp: , Rfl:    HYDROcodone-acetaminophen (HYCET) 7.5-325 mg/15 ml solution, Take 15 mLs by mouth every 6 (six) hours as needed for moderate pain., Disp: 473 mL, Rfl: 0   ibuprofen (ADVIL) 100 MG/5ML suspension, Take 10 mLs (200 mg total) by mouth every 4 (four) hours as needed for moderate pain., Disp: 473 mL, Rfl: 0   levothyroxine (SYNTHROID) 100 MCG tablet, TAKE 1 TABLET BY MOUTH ONCE DAILY BEFORE BREAKFAST, Disp: 90 tablet, Rfl: 0   lidocaine (XYLOCAINE) 2 % solution, Patient: Mix 1part 2% viscous lidocaine, 1part H20. Swallow 10mL of diluted mixture, before meals and at bedtime, up to QID for sore throat., Disp: 200 mL, Rfl: 3   lidocaine-prilocaine (EMLA) cream, APPLY CREAM TOPICALLY AS NEEDED, Disp: 30 g, Rfl: 3   Menthol, Topical Analgesic, (BIOFREEZE EX), Apply 1 application topically daily as needed (pain)., Disp: , Rfl:    nystatin (MYCOSTATIN) 100000 UNIT/ML suspension, Take 5 mLs (500,000 Units total) by mouth 4 (four) times daily., Disp: 60 mL, Rfl: 0   ondansetron (ZOFRAN) 8 MG tablet, Take 1 tablet (8 mg total) by mouth every 8 (eight) hours as needed for nausea or vomiting., Disp: 60 tablet, Rfl: 0   pantoprazole (PROTONIX) 40 MG tablet, Take 1 tablet by mouth once daily, Disp: 90 tablet, Rfl: 0   polyethylene glycol (MIRALAX / GLYCOLAX) 17 g packet, Take 17 g by mouth daily., Disp: , Rfl:    potassium chloride (KLOR-CON) 20 MEQ packet, Take 20 mEq by mouth 2 (two) times daily. Dissolve in at least 5 ounces cold water or other beverage and take after meals, Disp: 60 packet, Rfl: 6    prochlorperazine (COMPAZINE) 10 MG tablet, Take 1 tablet (10 mg total) by mouth every 6 (six) hours as needed for nausea or vomiting., Disp: 60 tablet, Rfl: 0   sertraline (ZOLOFT) 100 MG tablet, Take 100 mg by mouth daily., Disp: , Rfl:    sucralfate (CARAFATE) 1 g tablet, Dissolve 1 tablet in 10 mL H20 and swallow 30 min prior to meals and bedtime., Disp: 40 tablet, Rfl: 3   Vitamin D, Ergocalciferol, 50000 units CAPS, Take 1 capsule by mouth once a  week., Disp: , Rfl:    Allergies: No Known Allergies  REVIEW OF SYSTEMS:   Review of Systems  Constitutional:  Negative for chills, fatigue and fever.  HENT:   Positive for sore throat. Negative for lump/mass, mouth sores, nosebleeds and trouble swallowing.   Eyes:  Negative for eye problems.  Respiratory:  Positive for cough and shortness of breath.   Cardiovascular:  Negative for chest pain, leg swelling and palpitations.  Gastrointestinal:  Negative for abdominal pain, constipation, diarrhea, nausea and vomiting.  Genitourinary:  Negative for bladder incontinence, difficulty urinating, dysuria, frequency, hematuria and nocturia.   Musculoskeletal:  Negative for arthralgias, back pain, flank pain, myalgias and neck pain.  Skin:  Negative for itching and rash.  Neurological:  Positive for dizziness. Negative for headaches and numbness.  Hematological:  Does not bruise/bleed easily.  Psychiatric/Behavioral:  Positive for sleep disturbance. Negative for depression and suicidal ideas. The patient is not nervous/anxious.   All other systems reviewed and are negative.    VITALS:   Blood pressure 120/77, pulse 94, temperature 98.7 F (37.1 C), temperature source Oral, resp. rate 18, weight 207 lb (93.9 kg), SpO2 94%.  Wt Readings from Last 3 Encounters:  05/08/23 207 lb (93.9 kg)  02/14/23 202 lb 9.6 oz (91.9 kg)  01/31/23 203 lb 8 oz (92.3 kg)    Body mass index is 40.43 kg/m.  Performance status (ECOG): 1 - Symptomatic but completely  ambulatory  PHYSICAL EXAM:   Physical Exam Vitals and nursing note reviewed. Exam conducted with a chaperone present.  Constitutional:      Appearance: Normal appearance.  Cardiovascular:     Rate and Rhythm: Normal rate and regular rhythm.     Pulses: Normal pulses.     Heart sounds: Normal heart sounds.  Pulmonary:     Effort: Pulmonary effort is normal.     Breath sounds: Normal breath sounds.  Abdominal:     Palpations: Abdomen is soft. There is no hepatomegaly, splenomegaly or mass.     Tenderness: There is no abdominal tenderness.  Musculoskeletal:     Right lower leg: No edema.     Left lower leg: No edema.  Lymphadenopathy:     Cervical: No cervical adenopathy.     Right cervical: No superficial, deep or posterior cervical adenopathy.    Left cervical: No superficial, deep or posterior cervical adenopathy.     Upper Body:     Right upper body: No supraclavicular or axillary adenopathy.     Left upper body: No supraclavicular or axillary adenopathy.  Neurological:     General: No focal deficit present.     Mental Status: She is alert and oriented to person, place, and time.  Psychiatric:        Mood and Affect: Mood normal.        Behavior: Behavior normal.     LABS:      Latest Ref Rng & Units 05/01/2023   11:45 AM 02/14/2023   10:57 AM 01/25/2023   11:20 AM  CBC  WBC 4.0 - 10.5 K/uL 6.2  5.3  5.1   Hemoglobin 12.0 - 15.0 g/dL 40.9  81.1  91.4   Hematocrit 36.0 - 46.0 % 44.3  42.9  40.8   Platelets 150 - 400 K/uL 223  212  172       Latest Ref Rng & Units 05/01/2023   11:45 AM 02/14/2023   10:57 AM 01/25/2023   11:20 AM  CMP  Glucose 70 -  99 mg/dL 161  096  045   BUN 8 - 23 mg/dL 17  17  19    Creatinine 0.44 - 1.00 mg/dL 4.09  8.11  9.14   Sodium 135 - 145 mmol/L 135  134  137   Potassium 3.5 - 5.1 mmol/L 3.8  4.0  3.9   Chloride 98 - 111 mmol/L 101  101  108   CO2 22 - 32 mmol/L 25  24  23    Calcium 8.9 - 10.3 mg/dL 9.1  9.3  9.4   Total Protein 6.5 -  8.1 g/dL 7.4  7.5  6.7   Total Bilirubin 0.3 - 1.2 mg/dL 2.1  1.6  1.1   Alkaline Phos 38 - 126 U/L 67  68  64   AST 15 - 41 U/L 15  15  13    ALT 0 - 44 U/L 13  11  11       Lab Results  Component Value Date   CEA1 4.53 11/01/2021   /  CEA ([facility])  Date Value Ref Range Status  11/01/2021 4.53 0.00 - 5.00 ng/mL Final    Comment:    (NOTE) This test was performed using [employer]'s Chemiluminescent Microparticle Immunoassay. Values obtained from different assay methods cannot be used interchangeably. Please note that 5-10% of patients who smoke may see CEA levels up to 6.9 ng/mL. Performed at [facility], [address] [place], [place] [address]    No results found for: "PSA1" No results found for: "CAN199" Lab Results  Component Value Date   CAN125 18.9 08/10/2021    No results found for: "TOTALPROTELP", "ALBUMINELP", "A1GS", "A2GS", "BETS", "BETA2SER", "GAMS", "MSPIKE", "SPEI" No results found for: "TIBC", "FERRITIN", "IRONPCTSAT" No results found for: "LDH"   STUDIES:   CT Chest W Contrast  Result Date: 05/08/2023 CLINICAL DATA:  Non-small cell lung cancer, restaging. Squamous cell carcinoma. * Tracking Code: BO * EXAM: CT CHEST WITH CONTRAST TECHNIQUE: Multidetector CT imaging of the chest was performed during intravenous contrast administration. RADIATION DOSE REDUCTION: This exam was performed according to the departmental dose-optimization program which includes automated exposure control, adjustment of the mA and/or kV according to patient size and/or use of iterative reconstruction technique. CONTRAST:  OMNIPAQUE IOHEXOL 300 MG/ML  SOLN COMPARISON:  Chest CT 01/25/2023 and 10/26/2022.  PET-CT 01/27/2022. FINDINGS: Cardiovascular: Right IJ Port-A-Cath extends to the superior cavoatrial junction. Atherosclerosis of the aorta, great vessels and coronary arteries. No acute vascular findings are demonstrated. Small pericardial  effusion appears unchanged. The heart size is normal. Mediastinum/Nodes: Interval enlargement of previously demonstrated small mediastinal lymph nodes consistent with progressive metastatic adenopathy. There is a right supraclavicular node measuring 3.7 x 2.0 cm on image 5/1 and a left superior mediastinal node measuring 1.1 cm short axis on image 22/1. Additional small hilar and mediastinal lymph nodes have enlarged compared with the previous study. No axillary adenopathy. No significant change in previously demonstrated heterogeneous right thyroid nodule measuring 3.8 x 2.9 cm on image 10/1, without hypermetabolic activity on previous PET-CT. Mass effect on the trachea is unchanged.The esophagus appears unremarkable. Lungs/Pleura: No pleural effusion or pneumothorax. Mild to moderate centrilobular and paraseptal emphysema with stable radiation changes bilaterally. Subpleural right middle lobe nodular density adjacent to the right epicardial fat appears similar, measuring 1.7 x 1.4 cm on image 83/1. Possible new 3 mm right lower lobe nodule on image 60/3. Stable 3 mm left upper lobe nodule on image 27/3. No other suspicious nodules. Upper abdomen: The  visualized upper abdomen appears stable, without suspicious findings. Previous cholecystectomy. Musculoskeletal/Chest wall: Stable mixed sclerotic and lucent lesion within the T5 vertebral body. Multilevel thoracic spondylosis. Posttreatment changes in the left breast with calcifications and asymmetric dermal thickening are unchanged. Unless specific follow-up recommendations are mentioned in the findings or impression sections, no imaging follow-up of any mentioned incidental findings is recommended. IMPRESSION: 1. Progressive mediastinal and right supraclavicular adenopathy consistent with progressive metastatic disease. Consider tissue sampling of the right supraclavicular node or follow-up PET-CT for further evaluation. 2. The subpleural right middle lobe nodule  appears grossly stable and may be postinflammatory based on previous CTs. Possible new 3 mm right lower lobe nodule. 3. No other definite evidence of metastatic disease. 4. Stable heterogeneous right thyroid nodule which has been evaluated on previous imaging. 5. Stable small pericardial effusion. 6. Aortic Atherosclerosis (ICD10-I70.0) and Emphysema (ICD10-J43.9). Electronically Signed   By: Carey Bullocks M.D.   On: 05/08/2023 09:23   CT SOFT TISSUE NECK W CONTRAST  Result Date: 05/08/2023 CLINICAL DATA:  Stage III squamous cell cancer.  Follow-up. EXAM: CT NECK WITH CONTRAST TECHNIQUE: Multidetector CT imaging of the neck was performed using the standard protocol following the bolus administration of intravenous contrast. RADIATION DOSE REDUCTION: This exam was performed according to the departmental dose-optimization program which includes automated exposure control, adjustment of the mA and/or kV according to patient size and/or use of iterative reconstruction technique. CONTRAST:  OMNIPAQUE IOHEXOL 300 MG/ML  SOLN COMPARISON:  CT neck with contrast 08/03/2022 and 06/23/2021. PET scan 07/23/2021. CT neck with contrast 10/18/2021 FINDINGS: Pharynx and larynx: Post radiation changes are present at the glottis and supraglottic larynx. No residual or recurrent tumor is present. The airways patent. The nasopharynx and oropharynx are within normal limits. The trachea is unremarkable. Salivary glands: The submandibular and parotid glands and ducts are within normal limits. Thyroid: A 2.8 cm right thyroid nodule is increasing in size. The left lobe is normal. Lymph nodes: No significant cervical adenopathy is present. Vascular: Atherosclerotic calcifications are again noted at the aortic arch, great vessel origins in the carotid bifurcations bilaterally without significant stenosis. Limited intracranial: Within normal limits. Visualized orbits: The globes and orbits are within normal limits. Mastoids and  visualized paranasal sinuses: The paranasal sinuses and mastoid air cells are clear. Skeleton: Heterogeneous sclerotic changes are present at T5 with a pathologic fracture involving the superior endplate. 35% loss of height is noted without retropulsion of bone. This is stable compared to the chest CT of 01/25/2023 and 10/26/2022. Upper chest: The lung apices are clear. IMPRESSION: 1. Post radiation changes at the glottis and supraglottic larynx without evidence for residual or recurrent tumor. 2. No significant cervical adenopathy. 3. Stable pathologic fracture at T5 with 35% loss of height. 4. Enlarging 2.8 cm right thyroid nodule. Recommend thyroid US. Although this lesion has been biopsied in the past, changes in appearance warrant follow-up ultrasound. (Ref: J Am Coll Radiol. 2015 Feb;12(2): 143-50). 5. Stable atherosclerotic changes. 6.  Aortic Atherosclerosis (ICD10-I70.0). These results will be called to the ordering clinician or representative by the Radiologist Assistant, and communication documented in the PACS or [employer]. Electronically Signed   By: Marin Roberts M.D.   On: 05/08/2023 09:00

## 2023-05-09 ENCOUNTER — Other Ambulatory Visit: Payer: Self-pay

## 2023-05-17 ENCOUNTER — Other Ambulatory Visit: Payer: Self-pay

## 2023-05-17 MED ORDER — HYDROCODONE-ACETAMINOPHEN 7.5-325 MG/15ML PO SOLN: 15.0000 mL | Freq: Four times a day (QID) | ORAL | 0 refills | Status: DC | PRN

## 2023-05-18 ENCOUNTER — Ambulatory Visit (HOSPITAL_COMMUNITY)
Admission: RE | Admit: 2023-05-18 | Discharge: 2023-05-18 | Disposition: A | Discharge status: Home / Self Care | Payer: 59 | Source: Ambulatory Visit | Attending: Hematology | Admitting: Hematology

## 2023-05-18 DIAGNOSIS — C321 Malignant neoplasm of supraglottis: Secondary | ICD-10-CM | POA: Diagnosis not present

## 2023-05-18 DIAGNOSIS — C342 Malignant neoplasm of middle lobe, bronchus or lung: Secondary | ICD-10-CM | POA: Diagnosis not present

## 2023-05-18 DIAGNOSIS — C3490 Malignant neoplasm of unspecified part of unspecified bronchus or lung: Secondary | ICD-10-CM | POA: Diagnosis not present

## 2023-05-18 MED ORDER — FLUDEOXYGLUCOSE F - 18 (FDG) INJECTION
11.7000 | Freq: Once | INTRAVENOUS | Status: AC | PRN
  Administered 2023-05-18: 11.26 via INTRAVENOUS

## 2023-05-19 ENCOUNTER — Ambulatory Visit (HOSPITAL_COMMUNITY)
Admission: RE | Admit: 2023-05-19 | Discharge: 2023-05-19 | Disposition: A | Discharge status: Home / Self Care | Payer: 59 | Source: Ambulatory Visit | Attending: Hematology | Admitting: Hematology

## 2023-05-19 DIAGNOSIS — C760 Malignant neoplasm of head, face and neck: Secondary | ICD-10-CM | POA: Diagnosis not present

## 2023-05-19 DIAGNOSIS — C3490 Malignant neoplasm of unspecified part of unspecified bronchus or lung: Secondary | ICD-10-CM | POA: Diagnosis not present

## 2023-05-19 DIAGNOSIS — R42 Dizziness and giddiness: Secondary | ICD-10-CM | POA: Diagnosis not present

## 2023-05-19 DIAGNOSIS — C321 Malignant neoplasm of supraglottis: Secondary | ICD-10-CM | POA: Insufficient documentation

## 2023-05-19 MED ORDER — GADOBUTROL 1 MMOL/ML IV SOLN
9.0000 mL | Freq: Once | INTRAVENOUS | Status: AC | PRN
  Administered 2023-05-19: 9 mL via INTRAVENOUS

## 2023-05-24 ENCOUNTER — Other Ambulatory Visit: Payer: Self-pay | Admitting: Radiology

## 2023-05-25 ENCOUNTER — Ambulatory Visit (HOSPITAL_COMMUNITY)
Admission: RE | Admit: 2023-05-25 | Discharge: 2023-05-25 | Disposition: A | Discharge status: Home / Self Care | Payer: 59 | Source: Ambulatory Visit | Attending: Hematology | Admitting: Hematology

## 2023-05-25 ENCOUNTER — Other Ambulatory Visit: Payer: Self-pay

## 2023-05-25 ENCOUNTER — Encounter (HOSPITAL_COMMUNITY): Payer: Self-pay

## 2023-05-25 DIAGNOSIS — C321 Malignant neoplasm of supraglottis: Secondary | ICD-10-CM | POA: Diagnosis not present

## 2023-05-25 DIAGNOSIS — C801 Malignant (primary) neoplasm, unspecified: Secondary | ICD-10-CM | POA: Diagnosis not present

## 2023-05-25 DIAGNOSIS — C770 Secondary and unspecified malignant neoplasm of lymph nodes of head, face and neck: Secondary | ICD-10-CM | POA: Diagnosis not present

## 2023-05-25 DIAGNOSIS — C779 Secondary and unspecified malignant neoplasm of lymph node, unspecified: Secondary | ICD-10-CM | POA: Diagnosis not present

## 2023-05-25 DIAGNOSIS — C3490 Malignant neoplasm of unspecified part of unspecified bronchus or lung: Secondary | ICD-10-CM | POA: Insufficient documentation

## 2023-05-25 DIAGNOSIS — R590 Localized enlarged lymph nodes: Secondary | ICD-10-CM | POA: Insufficient documentation

## 2023-05-25 DIAGNOSIS — C3432 Malignant neoplasm of lower lobe, left bronchus or lung: Secondary | ICD-10-CM | POA: Diagnosis not present

## 2023-05-25 DIAGNOSIS — R599 Enlarged lymph nodes, unspecified: Secondary | ICD-10-CM | POA: Diagnosis not present

## 2023-05-25 LAB — GLUCOSE, CAPILLARY
Glucose-Capillary: 100 mg/dL — ABNORMAL HIGH (ref 70–99)
Glucose-Capillary: 98 mg/dL (ref 70–99)

## 2023-05-25 MED ORDER — LIDOCAINE HCL (PF) 1 % IJ SOLN
10.0000 mL | Freq: Once | INTRAMUSCULAR | Status: AC
  Administered 2023-05-25: 10 mL via INTRADERMAL

## 2023-05-25 MED ORDER — MIDAZOLAM HCL 2 MG/2ML IJ SOLN
INTRAMUSCULAR | Status: AC | PRN
  Administered 2023-05-25: .5 mg via INTRAVENOUS
  Administered 2023-05-25: 1 mg via INTRAVENOUS

## 2023-05-25 MED ORDER — MIDAZOLAM HCL 2 MG/2ML IJ SOLN
INTRAMUSCULAR | Status: AC
  Filled 2023-05-25: qty 2

## 2023-05-25 MED ORDER — FENTANYL CITRATE (PF) 100 MCG/2ML IJ SOLN
INTRAMUSCULAR | Status: AC
  Filled 2023-05-25: qty 2

## 2023-05-25 MED ORDER — FENTANYL CITRATE (PF) 100 MCG/2ML IJ SOLN
INTRAMUSCULAR | Status: AC | PRN
  Administered 2023-05-25: 50 ug via INTRAVENOUS

## 2023-05-25 NOTE — Procedures (Signed)
Interventional Radiology Procedure Note  Procedure: US Guided Biopsy of right supraclavicular lymph node  Complications: None  Estimated Blood Loss: < 10 mL  Findings: 18 G core biopsy of 3 cm right supraclavicular lymph node performed under US guidance.  Three core samples obtained and sent to Pathology.  Jodi Marble. Fredia Sorrow, M.D Pager:  [phone]

## 2023-05-25 NOTE — H&P (Signed)
Chief Complaint: Patient was seen in consultation today for image guided right sided supraclavicular lymph node biopsy at the request of Katragadda,Sreedhar  Referring Physician(s): Katragadda,Sreedhar  Supervising Physician: Irish Lack  Patient Status: [facility] - Out-pt  History of Present Illness: Regina Mcmahon is a 72 y.o. female   Full code status per patient Patient followed by Dr. Ellin Saba for lung and supraglotic cancer Now with new supraclavicular lymphadenopathy on CT 05/01/2023 IMPRESSION: 1. Progressive mediastinal and right supraclavicular adenopathy consistent with progressive metastatic disease. Consider tissue sampling of the right supraclavicular node or follow-up PET-CT for further evaluation. 2. The subpleural right middle lobe nodule appears grossly stable and may be postinflammatory based on previous CTs. Possible new 3 mm right lower lobe nodule. 3. No other definite evidence of metastatic disease. 4. Stable heterogeneous right thyroid nodule which has been evaluated on previous imaging. 5. Stable small pericardial effusion. Request made for image guided right sided supraclavicular lymph node biopsy Scheduled now for same in IR   Past Medical History:  Diagnosis Date   Arthritis    Asthma    Asymptomatic varicose veins    Breast cancer (HCC) 06/2014   left   Cancer (HCC)    Supra Glottis   Chronic airway obstruction (HCC)    Complication of anesthesia 2016   difficulty waking up, Oxygen dropped low.   COPD (chronic obstructive pulmonary disease) (HCC)    Depression    Dyspnea    GERD (gastroesophageal reflux disease)    Hemorrhage rectum    and anus.   Hypertension    Insomnia    Other symptoms involving digestive system(787.99)    Pain    Hx.pain in joint involving pelvic region and thigh; pain in limb   Palpitations    Panic attacks    Personal history of chemotherapy 2016   Personal history of radiation therapy 2016    Pneumonia    Pre-diabetes    Prediabetes    Sinusitis    Symptomatic menopausal or female climacteric states    Varicose veins of both lower extremities with pain     Past Surgical History:  Procedure Laterality Date   BREAST BIOPSY Left 10/2012   BREAST BIOPSY Left 07/01/2014   malignant   BREAST LUMPECTOMY Left 08/20/2014   BRONCHIAL BIOPSY  08/23/2021   Procedure: BRONCHIAL BIOPSIES;  Surgeon: Leslye Peer, MD;  Location: [facility] ENDOS[facility];  Service: Pulmonary;;   BRONCHIAL BRUSHINGS  08/23/2021   Procedure: BRONCHIAL BRUSHINGS;  Surgeon: Leslye Peer, MD;  Location: [facility] END[facility]COPY;  Service: Pulmonary;;   BRONCHIAL NEEDLE ASPIRATION BIOPSY  08/23/2021   Procedure: BRONCHIAL NEEDLE ASPIRATION BIOPSIES;  Surgeon: Leslye Peer, MD;  Location: [facility];  Service: Pulmonary;;   BRONCHIAL WASHINGS  08/23/2021   Procedure: BRONCHIAL WASHINGS;  Surgeon: Leslye Peer, MD;  Location: [facility];  Service: Pulmonary;;   CESAREAN SECTION     CHOLECYSTECTOMY  1985   COLONOSCOPY  2002   Dr. Cleotis Nipper: internal hemorrhoids, one small rectal polyp, adenomatous path    COLONOSCOPY N/A 11/23/2015   Procedure: COLONOSCOPY;  Surgeon: West Bali, MD;  Location: [facility];  Service: Endoscopy;  Laterality: N/A;  1100 - moved to 10:45 - office to notify   ESOPHAGOGASTRODUODENOSCOPY N/A 11/23/2015   Procedure: ESOPHAGOGASTRODUODENOSCOPY (EGD);  Surgeon: West Bali, MD;  Location: [facility];  Service: Endoscopy;  Laterality: N/A;   IR IMAGING GUIDED PORT INSERTION  11/08/2021   PORT-A-CATH REMOVAL  02/2015  PORTACATH PLACEMENT Right 09/17/2014   Procedure: INSERTION PORT-A-CATH WITH ULTRASOUND;  Surgeon: Harriette Bouillon, MD;  Location: [facility];  Service: General;  Laterality: Right;  IJ   RADIOACTIVE SEED GUIDED PARTIAL MASTECTOMY WITH AXILLARY SENTINEL LYMPH NODE BIOPSY Left 08/20/2014   Procedure: LEFT BREAST LUMPECTOMY WITH RADIOACTIVE SEED LOCALIZATION AND SENTINEL  LYMPH NODE MAPPING;  Surgeon: Harriette Bouillon, MD;  Location: [facility];  Service: General;  Laterality: Left;   RE-EXCISION OF BREAST LUMPECTOMY Left 09/17/2014   Procedure: RE-EXCISION OF BREAST LUMPECTOMY;  Surgeon: Harriette Bouillon, MD;  Location: [facility];  Service: General;  Laterality: Left;   TOTAL HIP ARTHROPLASTY Right 02/22/2021   Procedure: RIGHT TOTAL HIP ARTHROPLASTY ANTERIOR APPROACH;  Surgeon: Eldred Manges, MD;  Location: [facility] OR;  Service: Orthopedics;  Laterality: Right;   TUBAL LIGATION     VIDEO BRONCHOSCOPY WITH ENDOBRONCHIAL ULTRASOUND N/A 08/23/2021   Procedure: VIDEO BRONCHOSCOPY WITH ENDOBRONCHIAL ULTRASOUND;  Surgeon: Leslye Peer, MD;  Location: [facility];  Service: Pulmonary;  Laterality: N/A;   VIDEO BRONCHOSCOPY WITH RADIAL ENDOBRONCHIAL ULTRASOUND  08/23/2021   Procedure: VIDEO BRONCHOSCOPY WITH RADIAL ENDOBRONCHIAL ULTRASOUND;  Surgeon: Leslye Peer, MD;  Location: [facility];  Service: Pulmonary;;    Allergies: Patient has no known allergies.  Medications: Prior to Admission medications   Medication Sig Start Date End Date Taking? Authorizing Provider  albuterol (VENTOLIN HFA) 108 (90 Base) MCG/ACT inhaler Inhale 2 puffs into the lungs every 4 (four) hours as needed. 07/04/22  Yes [provider]  amoxicillin-clavulanate (AUGMENTIN) 875-125 MG tablet Take 1 tablet by mouth 2 (two) times daily. 12/15/22  Yes Doreatha Massed, MD  ANORO ELLIPTA 62.5-25 MCG/INH AEPB Inhale 1 puff into the lungs daily. 10/17/19  Yes [provider]  calcium carbonate (TUMS - DOSED IN MG ELEMENTAL CALCIUM) 500 MG chewable tablet Chew 2 tablets by mouth 3 (three) times daily as needed for indigestion or heartburn.   Yes [provider]  furosemide (LASIX) 20 MG tablet Take 20 mg by mouth daily.   Yes [provider]  hydrochlorothiazide (HYDRODIURIL) 12.5 MG tablet Take 12.5 mg by mouth daily. 12/25/20  Yes  [provider]  HYDROcodone-acetaminophen (HYCET) 7.5-325 mg/15 ml solution Take 15 mLs by mouth every 6 (six) hours as needed for moderate pain (pain score 4-6). 05/17/23  Yes Doreatha Massed, MD  ibuprofen (ADVIL) 100 MG/5ML suspension Take 10 mLs (200 mg total) by mouth every 4 (four) hours as needed for moderate pain. 01/23/23  Yes Doreatha Massed, MD  levothyroxine (SYNTHROID) 100 MCG tablet TAKE 1 TABLET BY MOUTH ONCE DAILY BEFORE BREAKFAST 05/01/23  Yes Doreatha Massed, MD  pantoprazole (PROTONIX) 40 MG tablet Take 1 tablet by mouth once daily 01/03/20  Yes Anice Paganini, NP  sertraline (ZOLOFT) 100 MG tablet Take 100 mg by mouth daily. 07/23/19  Yes [provider]  ALPRAZolam Prudy Feeler) 0.5 MG tablet Take 0.5 mg by mouth 3 (three) times daily.    [provider]  fluconazole (DIFLUCAN) 100 MG tablet Take 2 tablets today, then 1 tablet daily x 20 more days. 08/30/21   Lonie Peak, MD  lidocaine (XYLOCAINE) 2 % solution Patient: Mix 1part 2% viscous lidocaine, 1part H20. Swallow 10mL of diluted mixture, before meals and at bedtime, up to QID for sore throat. 12/23/21   [name], MD  lidocaine-prilocaine (EMLA) cream APPLY CREAM TOPICALLY AS NEEDED 01/30/23   Doreatha Massed, MD  Menthol, Topical Analgesic, (BIOFREEZE EX) Apply  1 application topically daily as needed (pain).    [provider]  nystatin (MYCOSTATIN) 100000 UNIT/ML suspension Take 5 mLs (500,000 Units total) by mouth 4 (four) times daily. 11/09/21   Briant Cedar, PA-C  ondansetron (ZOFRAN) 8 MG tablet Take 1 tablet (8 mg total) by mouth every 8 (eight) hours as needed for nausea or vomiting. 11/09/21   Georga Kaufmann T, PA-C  polyethylene glycol (MIRALAX / GLYCOLAX) 17 g packet Take 17 g by mouth daily.    [provider]  potassium chloride (KLOR-CON) 20 MEQ packet Take 20 mEq by mouth 2 (two) times daily. Dissolve in at least 5 ounces cold water or other beverage  and take after meals 10/04/22   Doreatha Massed, MD  prochlorperazine (COMPAZINE) 10 MG tablet Take 1 tablet (10 mg total) by mouth every 6 (six) hours as needed for nausea or vomiting. 11/09/21   Briant Cedar, PA-C  sucralfate (CARAFATE) 1 g tablet Dissolve 1 tablet in 10 mL H20 and swallow 30 min prior to meals and bedtime. 11/12/21   Lonie Peak, MD  Vitamin D, Ergocalciferol, 50000 units CAPS Take 1 capsule by mouth once a week.    [provider]     Family History  Problem Relation Age of Onset   Diabetes Mother    Ovarian cancer Mother 48   Other Father        died in MVA at age 65   Other Sister        mitral valve disorder   Melanoma Sister 37       removed from back of leg   Colon cancer Brother        early 80s, succumbed to disease   Cancer Paternal Aunt        leukemia, bone, or lung cancer   Alzheimer's disease Maternal Grandmother    Heart attack Maternal Grandfather    Heart attack Paternal Grandmother    COPD Paternal Grandfather    Emphysema Paternal Grandfather    Congestive Heart Failure Paternal Aunt    Stomach cancer Paternal Uncle    Kidney cancer Maternal Uncle    Cancer Cousin        dx. teens/dx. 40s   Cancer Cousin    Colon cancer Cousin     Social History   Socioeconomic History   Marital status: Divorced    Spouse name: Not on file   Number of children: 2   Years of education: Not on file   Highest education level: Not on file  Occupational History   Not on file  Tobacco Use   Smoking status: Former    Current packs/day: 0.00    Average packs/day: 1 pack/day for 42.0 years (42.0 ttl pk-yrs)    Types: Cigarettes    Start date: 02/23/1979    Quit date: 02/22/2021    Years since quitting: 2.2   Smokeless tobacco: Never  Vaping Use   Vaping status: Never Used  Substance and Sexual Activity   Alcohol use: No    Alcohol/week: 0.0 standard drinks of alcohol   Drug use: No   Sexual activity: Never    Birth  control/protection: None  Other Topics Concern   Not on file  Social History Narrative   Not on file   Social Determinants of Health   Financial Resource Strain: Not on file  Food Insecurity: Not on file  Transportation Needs: Not on file  Physical Activity: Not on file  Stress: Not on file  Social Connections: Unknown (08/05/2021)   Social Connection and Isolation Panel [NHANES]    Frequency of Communication with Friends and Family: More than three times a week    Frequency of Social Gatherings with Friends and Family: More than three times a week    Attends Religious Services: Not on Marketing executive or Organizations: Not on file    Attends Banker Meetings: Not on file    Marital Status: Not on file    Review of Systems: A 12 point ROS discussed and pertinent positives are indicated in the HPI above.  All other systems are negative.  Review of Systems  Constitutional:  Negative for activity change and fever.  Respiratory:  Positive for wheezing. Negative for shortness of breath.   Gastrointestinal:  Negative for abdominal pain.  Hematological:  Positive for adenopathy.  Psychiatric/Behavioral:  Negative for behavioral problems and confusion.     Vital Signs: BP (!) 148/83   Pulse 75   Temp 98.4 F (36.9 C) (Oral)   Resp 19   Ht 5\' 3"  (1.6 m)   Wt 200 lb (90.7 kg)   SpO2 96%   BMI 35.43 kg/m   Advance Care Plan: The advanced care plan/surrogate decision maker was discussed at the time of visit and documented in the medical record.    Physical Exam Constitutional:      General: She is not in acute distress.    Appearance: Normal appearance.  HENT:     Mouth/Throat:     Mouth: Mucous membranes are dry.  Cardiovascular:     Rate and Rhythm: Normal rate and regular rhythm.  Pulmonary:     Breath sounds: Wheezing and rales present.  Neurological:     Mental Status: She is oriented to person, place, and time.  Psychiatric:         Mood and Affect: Mood normal.        Behavior: Behavior normal.     Imaging: CT Chest W Contrast  Result Date: 05/08/2023 CLINICAL DATA:  Non-small cell lung cancer, restaging. Squamous cell carcinoma. * Tracking Code: BO * EXAM: CT CHEST WITH CONTRAST TECHNIQUE: Multidetector CT imaging of the chest was performed during intravenous contrast administration. RADIATION DOSE REDUCTION: This exam was performed according to the departmental dose-optimization program which includes automated exposure control, adjustment of the mA and/or kV according to patient size and/or use of iterative reconstruction technique. CONTRAST:  OMNIPAQUE IOHEXOL 300 MG/ML  SOLN COMPARISON:  Chest CT 01/25/2023 and 10/26/2022.  PET-CT 01/27/2022. FINDINGS: Cardiovascular: Right IJ Port-A-Cath extends to the superior cavoatrial junction. Atherosclerosis of the aorta, great vessels and coronary arteries. No acute vascular findings are demonstrated. Small pericardial effusion appears unchanged. The heart size is normal. Mediastinum/Nodes: Interval enlargement of previously demonstrated small mediastinal lymph nodes consistent with progressive metastatic adenopathy. There is a right supraclavicular node measuring 3.7 x 2.0 cm on image 5/1 and a left superior mediastinal node measuring 1.1 cm short axis on image 22/1. Additional small hilar and mediastinal lymph nodes have enlarged compared with the previous study. No axillary adenopathy. No significant change in previously demonstrated heterogeneous right thyroid nodule measuring 3.8 x 2.9 cm on image 10/1, without hypermetabolic activity on previous PET-CT. Mass effect on the trachea is unchanged.The esophagus appears unremarkable. Lungs/Pleura: No pleural effusion or pneumothorax. Mild to moderate centrilobular and paraseptal emphysema with stable radiation changes bilaterally. Subpleural right middle lobe nodular density adjacent to the right epicardial fat appears similar,  measuring 1.7 x 1.4 cm on image 83/1. Possible new 3 mm right lower lobe nodule on image 60/3. Stable 3 mm left upper lobe nodule on image 27/3. No other suspicious nodules. Upper abdomen: The visualized upper abdomen appears stable, without suspicious findings. Previous cholecystectomy. Musculoskeletal/Chest wall: Stable mixed sclerotic and lucent lesion within the T5 vertebral body. Multilevel thoracic spondylosis. Posttreatment changes in the left breast with calcifications and asymmetric dermal thickening are unchanged. Unless specific follow-up recommendations are mentioned in the findings or impression sections, no imaging follow-up of any mentioned incidental findings is recommended. IMPRESSION: 1. Progressive mediastinal and right supraclavicular adenopathy consistent with progressive metastatic disease. Consider tissue sampling of the right supraclavicular node or follow-up PET-CT for further evaluation. 2. The subpleural right middle lobe nodule appears grossly stable and may be postinflammatory based on previous CTs. Possible new 3 mm right lower lobe nodule. 3. No other definite evidence of metastatic disease. 4. Stable heterogeneous right thyroid nodule which has been evaluated on previous imaging. 5. Stable small pericardial effusion. 6. Aortic Atherosclerosis (ICD10-I70.0) and Emphysema (ICD10-J43.9). Electronically Signed   By: Carey Bullocks M.D.   On: 05/08/2023 09:23   CT SOFT TISSUE NECK W CONTRAST  Result Date: 05/08/2023 CLINICAL DATA:  Stage III squamous cell cancer.  Follow-up. EXAM: CT NECK WITH CONTRAST TECHNIQUE: Multidetector CT imaging of the neck was performed using the standard protocol following the bolus administration of intravenous contrast. RADIATION DOSE REDUCTION: This exam was performed according to the departmental dose-optimization program which includes automated exposure control, adjustment of the mA and/or kV according to patient size and/or use of iterative  reconstruction technique. CONTRAST:  OMNIPAQUE IOHEXOL 300 MG/ML  SOLN COMPARISON:  CT neck with contrast 08/03/2022 and 06/23/2021. PET scan 07/23/2021. CT neck with contrast 10/18/2021 FINDINGS: Pharynx and larynx: Post radiation changes are present at the glottis and supraglottic larynx. No residual or recurrent tumor is present. The airways patent. The nasopharynx and oropharynx are within normal limits. The trachea is unremarkable. Salivary glands: The submandibular and parotid glands and ducts are within normal limits. Thyroid: A 2.8 cm right thyroid nodule is increasing in size. The left lobe is normal. Lymph nodes: No significant cervical adenopathy is present. Vascular: Atherosclerotic calcifications are again noted at the aortic arch, great vessel origins in the carotid bifurcations bilaterally without significant stenosis. Limited intracranial: Within normal limits. Visualized orbits: The globes and orbits are within normal limits. Mastoids and visualized paranasal sinuses: The paranasal sinuses and mastoid air cells are clear. Skeleton: Heterogeneous sclerotic changes are present at T5 with a pathologic fracture involving the superior endplate. 35% loss of height is noted without retropulsion of bone. This is stable compared to the chest CT of 01/25/2023 and 10/26/2022. Upper chest: The lung apices are clear. IMPRESSION: 1. Post radiation changes at the glottis and supraglottic larynx without evidence for residual or recurrent tumor. 2. No significant cervical adenopathy. 3. Stable pathologic fracture at T5 with 35% loss of height. 4. Enlarging 2.8 cm right thyroid nodule. Recommend thyroid US. Although this lesion has been biopsied in the past, changes in appearance warrant follow-up ultrasound. (Ref: J Am Coll Radiol. 2015 Feb;12(2): 143-50). 5. Stable atherosclerotic changes. 6.  Aortic Atherosclerosis (ICD10-I70.0). These results will be called to the ordering clinician or representative by the  Radiologist Assistant, and communication documented in the PACS or [employer]. Electronically Signed   By: Marin Roberts M.D.   On: 05/08/2023 09:00    Labs:  CBC: Recent Labs  01/16/23 1217 01/25/23 1120 02/14/23 1057 05/01/23 1145  WBC 5.2 5.1 5.3 6.2  HGB 14.0 13.6 14.4 15.2*  HCT 42.0 40.8 42.9 44.3  PLT 186 172 212 223    COAGS: No results for input(s): "INR", "APTT" in the last 8760 hours.  BMP: Recent Labs    01/16/23 1217 01/25/23 1120 02/14/23 1057 05/01/23 1145  NA 134* 137 134* 135  K 3.8 3.9 4.0 3.8  CL 103 108 101 101  CO2 25 23 24 25   GLUCOSE 99 118* 108* 111*  BUN 14 19 17 17   CALCIUM 9.0 9.4 9.3 9.1  CREATININE 0.86 0.91 0.82 0.94  GFRNONAA >60 >60 >60 >60    LIVER FUNCTION TESTS: Recent Labs    01/16/23 1217 01/25/23 1120 02/14/23 1057 05/01/23 1145  BILITOT 1.5* 1.1 1.6* 2.1*  AST 13* 13* 15 15  ALT 10 11 11 13   ALKPHOS 63 64 68 67  PROT 6.8 6.7 7.5 7.4  ALBUMIN 3.7 3.5 3.8 3.9    TUMOR MARKERS: No results for input(s): "AFPTM", "CEA", "CA199", "CHROMGRNA" in the last 8760 hours.  Assessment and Plan: Scheduled for image guided right sided supraclavicular lymph node biopsy Risks and benefits of image guided right sided supraclavicular lymph node biopsy was discussed with the patient and/or patient's family including, but not limited to bleeding, infection, damage to adjacent structures or low yield requiring additional tests.  All of the questions were answered and there is agreement to proceed.  Consent signed and in chart.  Thank you for this interesting consult.  I greatly enjoyed meeting [name] and look forward to participating in their care.  A copy of this report was sent to the requesting provider on this date.  Electronically Signed: Robet Leu, PA-C 05/25/2023, 1:08 PM   I spent a total of  30 Minutes   in face to face in clinical consultation, greater than 50% of which was  counseling/coordinating care for image guided right-sided supraclavicular lymph node biopsy.

## 2023-05-26 LAB — SURGICAL PATHOLOGY

## 2023-05-28 ENCOUNTER — Observation Stay (HOSPITAL_COMMUNITY)
Admission: EM | Admit: 2023-05-28 | Discharge: 2023-05-30 | Disposition: A | Discharge status: Home / Self Care | Payer: 59 | Attending: Internal Medicine | Admitting: Internal Medicine

## 2023-05-28 ENCOUNTER — Encounter (HOSPITAL_COMMUNITY): Payer: Self-pay | Admitting: Emergency Medicine

## 2023-05-28 ENCOUNTER — Other Ambulatory Visit: Payer: Self-pay

## 2023-05-28 DIAGNOSIS — Z79899 Other long term (current) drug therapy: Secondary | ICD-10-CM | POA: Diagnosis not present

## 2023-05-28 DIAGNOSIS — Z87891 Personal history of nicotine dependence: Secondary | ICD-10-CM | POA: Diagnosis not present

## 2023-05-28 DIAGNOSIS — J9601 Acute respiratory failure with hypoxia: Secondary | ICD-10-CM | POA: Insufficient documentation

## 2023-05-28 DIAGNOSIS — R7989 Other specified abnormal findings of blood chemistry: Secondary | ICD-10-CM | POA: Diagnosis not present

## 2023-05-28 DIAGNOSIS — K219 Gastro-esophageal reflux disease without esophagitis: Secondary | ICD-10-CM | POA: Diagnosis present

## 2023-05-28 DIAGNOSIS — J441 Chronic obstructive pulmonary disease with (acute) exacerbation: Secondary | ICD-10-CM | POA: Diagnosis not present

## 2023-05-28 DIAGNOSIS — I10 Essential (primary) hypertension: Secondary | ICD-10-CM | POA: Diagnosis not present

## 2023-05-28 DIAGNOSIS — R0603 Acute respiratory distress: Secondary | ICD-10-CM | POA: Diagnosis not present

## 2023-05-28 DIAGNOSIS — Z7951 Long term (current) use of inhaled steroids: Secondary | ICD-10-CM | POA: Diagnosis not present

## 2023-05-28 DIAGNOSIS — Z96641 Presence of right artificial hip joint: Secondary | ICD-10-CM | POA: Diagnosis not present

## 2023-05-28 DIAGNOSIS — Z8521 Personal history of malignant neoplasm of larynx: Secondary | ICD-10-CM | POA: Insufficient documentation

## 2023-05-28 DIAGNOSIS — Z853 Personal history of malignant neoplasm of breast: Secondary | ICD-10-CM | POA: Insufficient documentation

## 2023-05-28 DIAGNOSIS — Z1152 Encounter for screening for COVID-19: Secondary | ICD-10-CM | POA: Diagnosis not present

## 2023-05-28 DIAGNOSIS — Z85828 Personal history of other malignant neoplasm of skin: Secondary | ICD-10-CM | POA: Diagnosis not present

## 2023-05-28 DIAGNOSIS — E039 Hypothyroidism, unspecified: Secondary | ICD-10-CM | POA: Diagnosis not present

## 2023-05-28 DIAGNOSIS — C3490 Malignant neoplasm of unspecified part of unspecified bronchus or lung: Secondary | ICD-10-CM | POA: Diagnosis present

## 2023-05-28 DIAGNOSIS — R0602 Shortness of breath: Secondary | ICD-10-CM | POA: Diagnosis not present

## 2023-05-28 DIAGNOSIS — J9811 Atelectasis: Secondary | ICD-10-CM | POA: Diagnosis not present

## 2023-05-28 MED ORDER — ALBUTEROL SULFATE HFA 108 (90 BASE) MCG/ACT IN AERS
2.0000 | INHALATION_SPRAY | RESPIRATORY_TRACT | Status: DC | PRN
  Filled 2023-05-28: qty 6.7

## 2023-05-28 NOTE — ED Triage Notes (Signed)
Pt with c/o SOB that started today.

## 2023-05-29 ENCOUNTER — Other Ambulatory Visit (HOSPITAL_COMMUNITY): Payer: Self-pay | Admitting: *Deleted

## 2023-05-29 ENCOUNTER — Encounter (HOSPITAL_COMMUNITY): Payer: Self-pay | Admitting: Internal Medicine

## 2023-05-29 ENCOUNTER — Other Ambulatory Visit: Payer: Self-pay

## 2023-05-29 ENCOUNTER — Observation Stay (HOSPITAL_BASED_OUTPATIENT_CLINIC_OR_DEPARTMENT_OTHER): Payer: 59

## 2023-05-29 ENCOUNTER — Emergency Department (HOSPITAL_COMMUNITY): Payer: 59

## 2023-05-29 DIAGNOSIS — E039 Hypothyroidism, unspecified: Secondary | ICD-10-CM | POA: Insufficient documentation

## 2023-05-29 DIAGNOSIS — R0609 Other forms of dyspnea: Secondary | ICD-10-CM | POA: Diagnosis not present

## 2023-05-29 DIAGNOSIS — K219 Gastro-esophageal reflux disease without esophagitis: Secondary | ICD-10-CM

## 2023-05-29 DIAGNOSIS — R0602 Shortness of breath: Secondary | ICD-10-CM | POA: Diagnosis not present

## 2023-05-29 DIAGNOSIS — C3492 Malignant neoplasm of unspecified part of left bronchus or lung: Secondary | ICD-10-CM | POA: Diagnosis not present

## 2023-05-29 DIAGNOSIS — J441 Chronic obstructive pulmonary disease with (acute) exacerbation: Secondary | ICD-10-CM | POA: Diagnosis not present

## 2023-05-29 DIAGNOSIS — J9601 Acute respiratory failure with hypoxia: Secondary | ICD-10-CM | POA: Insufficient documentation

## 2023-05-29 DIAGNOSIS — R7989 Other specified abnormal findings of blood chemistry: Secondary | ICD-10-CM | POA: Diagnosis not present

## 2023-05-29 DIAGNOSIS — J9811 Atelectasis: Secondary | ICD-10-CM | POA: Diagnosis not present

## 2023-05-29 LAB — CBC WITH DIFFERENTIAL/PLATELET
Abs Immature Granulocytes: 0.04 10*3/uL (ref 0.00–0.07)
Basophils Absolute: 0 10*3/uL (ref 0.0–0.1)
Basophils Relative: 0 %
Eosinophils Absolute: 0.1 10*3/uL (ref 0.0–0.5)
Eosinophils Relative: 1 %
HCT: 43.8 % (ref 36.0–46.0)
Hemoglobin: 15 g/dL (ref 12.0–15.0)
Immature Granulocytes: 0 %
Lymphocytes Relative: 4 %
Lymphs Abs: 0.4 10*3/uL — ABNORMAL LOW (ref 0.7–4.0)
MCH: 30.5 pg (ref 26.0–34.0)
MCHC: 34.2 g/dL (ref 30.0–36.0)
MCV: 89 fL (ref 80.0–100.0)
Monocytes Absolute: 0.5 10*3/uL (ref 0.1–1.0)
Monocytes Relative: 4 %
Neutro Abs: 10.8 10*3/uL — ABNORMAL HIGH (ref 1.7–7.7)
Neutrophils Relative %: 91 %
Platelets: 183 10*3/uL (ref 150–400)
RBC: 4.92 MIL/uL (ref 3.87–5.11)
RDW: 12.6 % (ref 11.5–15.5)
WBC: 11.8 10*3/uL — ABNORMAL HIGH (ref 4.0–10.5)
nRBC: 0 % (ref 0.0–0.2)

## 2023-05-29 LAB — COMPREHENSIVE METABOLIC PANEL
ALT: 13 U/L (ref 0–44)
AST: 17 U/L (ref 15–41)
Albumin: 3.9 g/dL (ref 3.5–5.0)
Alkaline Phosphatase: 67 U/L (ref 38–126)
Anion gap: 8 (ref 5–15)
BUN: 15 mg/dL (ref 8–23)
CO2: 27 mmol/L (ref 22–32)
Calcium: 9.4 mg/dL (ref 8.9–10.3)
Chloride: 101 mmol/L (ref 98–111)
Creatinine, Ser: 0.93 mg/dL (ref 0.44–1.00)
GFR, Estimated: >60 mL/min (ref >60)
Glucose, Bld: 155 mg/dL — ABNORMAL HIGH (ref 70–99)
Potassium: 3.9 mmol/L (ref 3.5–5.1)
Sodium: 136 mmol/L (ref 135–145)
Total Bilirubin: 2.5 mg/dL — ABNORMAL HIGH (ref <1.2)
Total Protein: 7.4 g/dL (ref 6.5–8.1)

## 2023-05-29 LAB — ECHOCARDIOGRAM COMPLETE
AR max vel: 2.12 cm2
AV Area VTI: 2.54 cm2
AV Area mean vel: 2.04 cm2
AV Mean grad: 4.6 mm[Hg]
AV Peak grad: 11 mm[Hg]
Ao pk vel: 1.66 m/s
Area-P 1/2: 1.89 cm2
Height: 63 in
S' Lateral: 2.3 cm
Weight: 3312.19 [oz_av]

## 2023-05-29 LAB — TROPONIN I (HIGH SENSITIVITY)
Troponin I (High Sensitivity): 4 ng/L (ref <18)
Troponin I (High Sensitivity): 4 ng/L (ref <18)

## 2023-05-29 LAB — RESP PANEL BY RT-PCR (RSV, FLU A&B, COVID)  RVPGX2
Influenza A by PCR: NEGATIVE
Influenza B by PCR: NEGATIVE
Resp Syncytial Virus by PCR: NEGATIVE
SARS Coronavirus 2 by RT PCR: NEGATIVE

## 2023-05-29 LAB — BRAIN NATRIURETIC PEPTIDE: B Natriuretic Peptide: 116 pg/mL — ABNORMAL HIGH (ref 0.0–100.0)

## 2023-05-29 LAB — HIV ANTIBODY (ROUTINE TESTING W REFLEX): HIV Screen 4th Generation wRfx: NONREACTIVE

## 2023-05-29 LAB — LACTIC ACID, PLASMA
Lactic Acid, Venous: 1.2 mmol/L (ref 0.5–1.9)
Lactic Acid, Venous: 1.3 mmol/L (ref 0.5–1.9)

## 2023-05-29 LAB — PHOSPHORUS: Phosphorus: 2.9 mg/dL (ref 2.5–4.6)

## 2023-05-29 LAB — MAGNESIUM: Magnesium: 2.8 mg/dL — ABNORMAL HIGH (ref 1.7–2.4)

## 2023-05-29 MED ORDER — IPRATROPIUM-ALBUTEROL 0.5-2.5 (3) MG/3ML IN SOLN
3.0000 mL | Freq: Once | RESPIRATORY_TRACT | Status: AC
  Administered 2023-05-29: 3 mL via RESPIRATORY_TRACT
  Filled 2023-05-29: qty 3

## 2023-05-29 MED ORDER — METHYLPREDNISOLONE SODIUM SUCC 40 MG IJ SOLR
40.0000 mg | Freq: Two times a day (BID) | INTRAMUSCULAR | Status: DC
  Administered 2023-05-29 – 2023-05-30 (×3): 40 mg via INTRAVENOUS
  Filled 2023-05-29 (×3): qty 1

## 2023-05-29 MED ORDER — IPRATROPIUM-ALBUTEROL 0.5-2.5 (3) MG/3ML IN SOLN
3.0000 mL | Freq: Four times a day (QID) | RESPIRATORY_TRACT | Status: DC
  Administered 2023-05-29: 3 mL via RESPIRATORY_TRACT
  Filled 2023-05-29: qty 3

## 2023-05-29 MED ORDER — SODIUM CHLORIDE 0.9 % IV BOLUS
500.0000 mL | Freq: Once | INTRAVENOUS | Status: AC
  Administered 2023-05-29: 500 mL via INTRAVENOUS

## 2023-05-29 MED ORDER — ACETAMINOPHEN 325 MG PO TABS: 650.0000 mg | ORAL_TABLET | Freq: Four times a day (QID) | ORAL | Status: DC | PRN

## 2023-05-29 MED ORDER — ENOXAPARIN SODIUM 40 MG/0.4ML IJ SOSY
40.0000 mg | PREFILLED_SYRINGE | INTRAMUSCULAR | Status: DC
  Administered 2023-05-29: 40 mg via SUBCUTANEOUS
  Filled 2023-05-29 (×2): qty 0.4

## 2023-05-29 MED ORDER — METHYLPREDNISOLONE SODIUM SUCC 125 MG IJ SOLR
62.5000 mg | Freq: Once | INTRAMUSCULAR | Status: AC
  Administered 2023-05-29: 62.5 mg via INTRAVENOUS
  Filled 2023-05-29: qty 2

## 2023-05-29 MED ORDER — ALPRAZOLAM 0.5 MG PO TABS
0.5000 mg | ORAL_TABLET | Freq: Three times a day (TID) | ORAL | Status: DC
  Administered 2023-05-29 – 2023-05-30 (×3): 0.5 mg via ORAL
  Filled 2023-05-29 (×3): qty 1

## 2023-05-29 MED ORDER — PERFLUTREN LIPID MICROSPHERE
1.0000 mL | INTRAVENOUS | Status: AC | PRN
  Administered 2023-05-29: 4 mL via INTRAVENOUS

## 2023-05-29 MED ORDER — HYDROCODONE-ACETAMINOPHEN 7.5-325 MG/15ML PO SOLN
15.0000 mL | Freq: Four times a day (QID) | ORAL | Status: DC | PRN
  Administered 2023-05-29 – 2023-05-30 (×3): 15 mL via ORAL
  Filled 2023-05-29 (×3): qty 15

## 2023-05-29 MED ORDER — PANTOPRAZOLE SODIUM 40 MG PO TBEC
40.0000 mg | DELAYED_RELEASE_TABLET | Freq: Every day | ORAL | Status: DC
  Administered 2023-05-29 – 2023-05-30 (×2): 40 mg via ORAL
  Filled 2023-05-29 (×2): qty 1

## 2023-05-29 MED ORDER — CEFTRIAXONE SODIUM 1 G IJ SOLR
1.0000 g | Freq: Once | INTRAMUSCULAR | Status: AC
  Administered 2023-05-29: 1 g via INTRAVENOUS
  Filled 2023-05-29: qty 10

## 2023-05-29 MED ORDER — ONDANSETRON HCL 4 MG/2ML IJ SOLN: 4.0000 mg | Freq: Four times a day (QID) | INTRAMUSCULAR | Status: DC | PRN

## 2023-05-29 MED ORDER — ACETAMINOPHEN 650 MG RE SUPP: 650.0000 mg | Freq: Four times a day (QID) | RECTAL | Status: DC | PRN

## 2023-05-29 MED ORDER — DM-GUAIFENESIN ER 30-600 MG PO TB12
1.0000 | ORAL_TABLET | Freq: Two times a day (BID) | ORAL | Status: DC
  Administered 2023-05-29 – 2023-05-30 (×4): 1 via ORAL
  Filled 2023-05-29 (×4): qty 1

## 2023-05-29 MED ORDER — LEVOTHYROXINE SODIUM 100 MCG PO TABS
100.0000 ug | ORAL_TABLET | Freq: Every day | ORAL | Status: DC
  Administered 2023-05-29 – 2023-05-30 (×2): 100 ug via ORAL
  Filled 2023-05-29 (×2): qty 1

## 2023-05-29 MED ORDER — MAGNESIUM SULFATE 2 GM/50ML IV SOLN
2.0000 g | Freq: Once | INTRAVENOUS | Status: AC
  Administered 2023-05-29: 2 g via INTRAVENOUS
  Filled 2023-05-29: qty 50

## 2023-05-29 MED ORDER — AZITHROMYCIN 250 MG PO TABS
250.0000 mg | ORAL_TABLET | Freq: Every day | ORAL | Status: DC
  Administered 2023-05-30: 250 mg via ORAL
  Filled 2023-05-29: qty 1

## 2023-05-29 MED ORDER — ONDANSETRON HCL 4 MG PO TABS: 4.0000 mg | ORAL_TABLET | Freq: Four times a day (QID) | ORAL | Status: DC | PRN

## 2023-05-29 MED ORDER — SERTRALINE HCL 50 MG PO TABS
100.0000 mg | ORAL_TABLET | Freq: Every day | ORAL | Status: DC
  Administered 2023-05-29 – 2023-05-30 (×2): 100 mg via ORAL
  Filled 2023-05-29 (×2): qty 2

## 2023-05-29 MED ORDER — FUROSEMIDE 20 MG PO TABS
20.0000 mg | ORAL_TABLET | Freq: Every day | ORAL | Status: DC
  Administered 2023-05-29 – 2023-05-30 (×2): 20 mg via ORAL
  Filled 2023-05-29 (×2): qty 1

## 2023-05-29 MED ORDER — AZITHROMYCIN 250 MG PO TABS
500.0000 mg | ORAL_TABLET | Freq: Every day | ORAL | Status: AC
  Administered 2023-05-29: 500 mg via ORAL
  Filled 2023-05-29: qty 2

## 2023-05-29 MED ORDER — IPRATROPIUM-ALBUTEROL 0.5-2.5 (3) MG/3ML IN SOLN
3.0000 mL | Freq: Two times a day (BID) | RESPIRATORY_TRACT | Status: DC
  Administered 2023-05-29 – 2023-05-30 (×2): 3 mL via RESPIRATORY_TRACT
  Filled 2023-05-29 (×2): qty 3

## 2023-05-29 NOTE — ED Notes (Signed)
Pt given some ice water.

## 2023-05-29 NOTE — Progress Notes (Signed)
   05/29/23 0956  TOC Brief Assessment  Insurance and Status Reviewed  Patient has primary care physician Yes  Home environment has been reviewed from home  Prior level of function: independent  Prior/Current Home Services No current home services  Social Determinants of Health Reivew SDOH reviewed no interventions necessary  Readmission risk has been reviewed Yes  Transition of care needs no transition of care needs at this time    Transition of Care Department [facility]) has reviewed patient and no TOC needs have been identified at this time. We will continue to monitor patient advancement through interdisciplinary progression rounds. If new patient transition needs arise, please place a TOC consult.

## 2023-05-29 NOTE — H&P (Signed)
History and Physical    Patient: Regina Mcmahon JJO:[id number] DOB: 08-06-1950 DOA: 05/28/2023 DOS: the patient was seen and examined on 05/29/2023 PCP: Benita Stabile, MD  Patient coming from: Home  Chief Complaint:  Chief Complaint  Patient presents with   Shortness of Breath   HPI: Regina Mcmahon is a 72 y.o. 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  She complained of 3-day onset of productive cough with greenish sputum which has been worsening.  She complained of a nonradiating midsternal chest pain that started last evening, pain was intermittently sharp and achy and was associated with breathing difficulty..  Patient states that she has been compliant with home meds and she denies any sick contacts, patient also denies fever, chills, nausea, vomiting.  ED Course:  In the emergency department, she was tachypneic and tachycardic, O2 sat was reported to be in the low 80s on room air per EDP and supplemental oxygen via NP at 12 PM was provided with improvement in O2 sat to 96% (patient does not use oxygen at baseline).  Workup in the ED showed normal CBC except for WBC of 11.8, BMP was normal except for blood glucose of 155.  Lactic acid was normal, troponin was normal, BNP 116.  Influenza A, B, SARS coronavirus 2, RSV was normal. Chest x-ray showed right base atelectasis.  No active disease. Patient was treated with IV ceftriaxone, breathing treatment was provided, IV Solu-Medrol 62.5 mg x 1 was given.  IV NS 500 mL was provided. Hospitalist was asked to admit patient for further evaluation and management.  Review of Systems: Review of systems as noted in the HPI. All other systems reviewed and are negative.   Past Medical History:  Diagnosis Date   Arthritis    Asthma    Asymptomatic varicose veins     Breast cancer (HCC) 06/2014   left   Cancer (HCC)    Supra Glottis   Chronic airway obstruction (HCC)    Complication of anesthesia 2016   difficulty waking up, Oxygen dropped low.   COPD (chronic obstructive pulmonary disease) (HCC)    Depression    Dyspnea    GERD (gastroesophageal reflux disease)    Hemorrhage rectum    and anus.   Hypertension    Insomnia    Other symptoms involving digestive system(787.99)    Pain    Hx.pain in joint involving pelvic region and thigh; pain in limb   Palpitations    Panic attacks    Personal history of chemotherapy 2016   Personal history of radiation therapy 2016   Pneumonia    Pre-diabetes    Prediabetes    Sinusitis    Symptomatic menopausal or female climacteric states    Varicose veins of both lower extremities with pain    Past Surgical History:  Procedure Laterality Date   BREAST BIOPSY Left 10/2012   BREAST BIOPSY Left 07/01/2014   malignant   BREAST LUMPECTOMY Left 08/20/2014   BRONCHIAL BIOPSY  08/23/2021   Procedure: BRONCHIAL BIOPSIES;  Surgeon: Leslye Peer, MD;  Location: [facility];  Service: Pulmonary;;   BRONCHIAL BRUSHINGS  08/23/2021   Procedure: BRONCHIAL BRUSHINGS;  Surgeon: Leslye Peer, MD;  Location: [facility] ENDOSCOPY;  Service: Pulmonary;;   BRONCHIAL NEEDLE ASPIRATION BIOPSY  08/23/2021   Procedure: BRONCHIAL NEEDLE ASPIRATION BIOPSIES;  Surgeon: Delton Coombes,  Les Pou, MD;  Location: [facility] ENDOSCOPY;  Service: Pulmonary;;   BRONCHIAL WASHINGS  08/23/2021   Procedure: BRONCHIAL WASHINGS;  Surgeon: Leslye Peer, MD;  Location: [facility] ENDOSCOPY;  Service: Pulmonary;;   CESAREAN SECTION     CHOLECYSTECTOMY  1985   COLONOSCOPY  2002   Dr. Cleotis Nipper: internal hemorrhoids, one small rectal polyp, adenomatous path    COLONOSCOPY N/A 11/23/2015   Procedure: COLONOSCOPY;  Surgeon: West Bali, MD;  Location: [facility];  Service: Endoscopy;  Laterality: N/A;  1100 - moved to 10:45 - office to notify    ESOPHAGOGASTRODUODENOSCOPY N/A 11/23/2015   Procedure: ESOPHAGOGASTRODUODENOSCOPY (EGD);  Surgeon: West Bali, MD;  Location: [facility];  Service: Endoscopy;  Laterality: N/A;   IR IMAGING GUIDED PORT INSERTION  11/08/2021   PORT-A-CATH REMOVAL  02/2015   PORTACATH PLACEMENT Right 09/17/2014   Procedure: INSERTION PORT-A-CATH WITH ULTRASOUND;  Surgeon: Harriette Bouillon, MD;  Location: [facility];  Service: General;  Laterality: Right;  IJ   RADIOACTIVE SEED GUIDED PARTIAL MASTECTOMY WITH AXILLARY SENTINEL LYMPH NODE BIOPSY Left 08/20/2014   Procedure: LEFT BREAST LUMPECTOMY WITH RADIOACTIVE SEED LOCALIZATION AND SENTINEL LYMPH NODE MAPPING;  Surgeon: Harriette Bouillon, MD;  Location: [facility];  Service: General;  Laterality: Left;   RE-EXCISION OF BREAST LUMPECTOMY Left 09/17/2014   Procedure: RE-EXCISION OF BREAST LUMPECTOMY;  Surgeon: Harriette Bouillon, MD;  Location:  SURGERY CENTER;  Service: General;  Laterality: Left;   TOTAL HIP ARTHROPLASTY Right 02/22/2021   Procedure: RIGHT TOTAL HIP ARTHROPLASTY ANTERIOR APPROACH;  Surgeon: Eldred Manges, MD;  Location: [facility] OR;  Service: Orthopedics;  Laterality: Right;   TUBAL LIGATION     VIDEO BRONCHOSCOPY WITH ENDOBRONCHIAL ULTRASOUND N/A 08/23/2021   Procedure: VIDEO BRONCHOSCOPY WITH ENDOBRONCHIAL ULTRASOUND;  Surgeon: Leslye Peer, MD;  Location: [facility];  Service: Pulmonary;  Laterality: N/A;   VIDEO BRONCHOSCOPY WITH RADIAL ENDOBRONCHIAL ULTRASOUND  08/23/2021   Procedure: VIDEO BRONCHOSCOPY WITH RADIAL ENDOBRONCHIAL ULTRASOUND;  Surgeon: Leslye Peer, MD;  Location: [facility];  Service: Pulmonary;;    Social History:  reports that she quit smoking about 2 years ago. Her smoking use included cigarettes. She started smoking about 44 years ago. She has a 42 pack-year smoking history. She has never used smokeless tobacco. She reports that she does not drink alcohol and does not use drugs.   No Known  Allergies  Family History  Problem Relation Age of Onset   Diabetes Mother    Ovarian cancer Mother 63   Other Father        died in MVA at age 56   Other Sister        mitral valve disorder   Melanoma Sister 5       removed from back of leg   Colon cancer Brother        early 39s, succumbed to disease   Cancer Paternal Aunt        leukemia, bone, or lung cancer   Alzheimer's disease Maternal Grandmother    Heart attack Maternal Grandfather    Heart attack Paternal Grandmother    COPD Paternal Grandfather    Emphysema Paternal Grandfather    Congestive Heart Failure Paternal Aunt    Stomach cancer Paternal Uncle    Kidney cancer Maternal Uncle    Cancer Cousin        dx. teens/dx. 40s   Cancer Cousin    Colon cancer Cousin  Prior to Admission medications   Medication Sig Start Date End Date Taking? Authorizing Provider  albuterol (VENTOLIN HFA) 108 (90 Base) MCG/ACT inhaler Inhale 2 puffs into the lungs every 4 (four) hours as needed. 07/04/22   [provider]  ALPRAZolam Prudy Feeler) 0.5 MG tablet Take 0.5 mg by mouth 3 (three) times daily.    [provider]  amoxicillin-clavulanate (AUGMENTIN) 875-125 MG tablet Take 1 tablet by mouth 2 (two) times daily. 12/15/22   Doreatha Massed, MD  ANORO ELLIPTA 62.5-25 MCG/INH AEPB Inhale 1 puff into the lungs daily. 10/17/19   [provider]  calcium carbonate (TUMS - DOSED IN MG ELEMENTAL CALCIUM) 500 MG chewable tablet Chew 2 tablets by mouth 3 (three) times daily as needed for indigestion or heartburn.    [provider]  fluconazole (DIFLUCAN) 100 MG tablet Take 2 tablets today, then 1 tablet daily x 20 more days. 08/30/21   Lonie Peak, MD  furosemide (LASIX) 20 MG tablet Take 20 mg by mouth daily.    [provider]  hydrochlorothiazide (HYDRODIURIL) 12.5 MG tablet Take 12.5 mg by mouth daily. 12/25/20   [provider]  HYDROcodone-acetaminophen (HYCET) 7.5-325 mg/15 ml  solution Take 15 mLs by mouth every 6 (six) hours as needed for moderate pain (pain score 4-6). 05/17/23   Doreatha Massed, MD  ibuprofen (ADVIL) 100 MG/5ML suspension Take 10 mLs (200 mg total) by mouth every 4 (four) hours as needed for moderate pain. 01/23/23   Doreatha Massed, MD  levothyroxine (SYNTHROID) 100 MCG tablet TAKE 1 TABLET BY MOUTH ONCE DAILY BEFORE BREAKFAST 05/01/23   Doreatha Massed, MD  lidocaine (XYLOCAINE) 2 % solution Patient: Mix 1part 2% viscous lidocaine, 1part H20. Swallow 10mL of diluted mixture, before meals and at bedtime, up to QID for sore throat. 12/23/21   [name], MD  lidocaine-prilocaine (EMLA) cream APPLY CREAM TOPICALLY AS NEEDED 01/30/23   Doreatha Massed, MD  Menthol, Topical Analgesic, (BIOFREEZE EX) Apply 1 application topically daily as needed (pain).    [provider]  nystatin (MYCOSTATIN) 100000 UNIT/ML suspension Take 5 mLs (500,000 Units total) by mouth 4 (four) times daily. 11/09/21   Briant Cedar, PA-C  ondansetron (ZOFRAN) 8 MG tablet Take 1 tablet (8 mg total) by mouth every 8 (eight) hours as needed for nausea or vomiting. 11/09/21   Briant Cedar, PA-C  pantoprazole (PROTONIX) 40 MG tablet Take 1 tablet by mouth once daily 01/03/20   Anice Paganini, NP  polyethylene glycol (MIRALAX / GLYCOLAX) 17 g packet Take 17 g by mouth daily.    [provider]  potassium chloride (KLOR-CON) 20 MEQ packet Take 20 mEq by mouth 2 (two) times daily. Dissolve in at least 5 ounces cold water or other beverage and take after meals 10/04/22   Doreatha Massed, MD  prochlorperazine (COMPAZINE) 10 MG tablet Take 1 tablet (10 mg total) by mouth every 6 (six) hours as needed for nausea or vomiting. 11/09/21   Briant Cedar, PA-C  sertraline (ZOLOFT) 100 MG tablet Take 100 mg by mouth daily. 07/23/19   [provider]  sucralfate (CARAFATE) 1 g tablet Dissolve 1 tablet in 10 mL H20 and swallow 30 min prior to meals  and bedtime. 11/12/21   Lonie Peak, MD  Vitamin D, Ergocalciferol, 50000 units CAPS Take 1 capsule by mouth once a week.    [provider]    Physical Exam: BP 129/82   Pulse 94   Temp 99.1 F (  37.3 C)   Resp (!) 26   Ht 5\' 3"  (1.6 m)   Wt 91 kg   SpO2 96%   BMI 35.54 kg/m   General: 72 y.o. year-old female well developed well nourished in no acute distress.  Alert and oriented x3. HEENT: NCAT, EOMI Neck: Supple, trachea medial Cardiovascular: Tachycardia.  Regular rate and rhythm with no rubs or gallops.  No thyromegaly or JVD noted.  No lower extremity edema. 2/4 pulses in all 4 extremities. Respiratory: Tachypnea.  Clear to auscultation with no wheezes or rales. Good inspiratory effort. Abdomen: Soft, nontender nondistended with normal bowel sounds x4 quadrants. Muskuloskeletal: No cyanosis, clubbing or edema noted bilaterally Neuro: CN II-XII intact, strength 5/5 x 4, sensation, reflexes intact Skin: No ulcerative lesions noted or rashes Psychiatry: Judgement and insight appear normal. Mood is appropriate for condition and setting          Labs on Admission:  Basic Metabolic Panel: Recent Labs  Lab 05/29/23 0032  NA 136  K 3.9  CL 101  CO2 27  GLUCOSE 155*  BUN 15  CREATININE 0.93  CALCIUM 9.4   Liver Function Tests: Recent Labs  Lab 05/29/23 0032  AST 17  ALT 13  ALKPHOS 67  BILITOT 2.5*  PROT 7.4  ALBUMIN 3.9   No results for input(s): "LIPASE", "AMYLASE" in the last 168 hours. No results for input(s): "AMMONIA" in the last 168 hours. CBC: Recent Labs  Lab 05/29/23 0032  WBC 11.8*  NEUTROABS 10.8*  HGB 15.0  HCT 43.8  MCV 89.0  PLT 183   Cardiac Enzymes: No results for input(s): "CKTOTAL", "CKMB", "CKMBINDEX", "TROPONINI" in the last 168 hours.  BNP (last 3 results) Recent Labs    05/29/23 0032  BNP 116.0*    ProBNP (last 3 results) No results for input(s): "PROBNP" in the last 8760 hours.  CBG: Recent Labs  Lab  05/25/23 1149 05/25/23 1421  GLUCAP 100* 98    Radiological Exams on Admission: DG Chest Portable 1 View  Result Date: 05/29/2023 CLINICAL DATA:  Shortness of breath EXAM: PORTABLE CHEST 1 VIEW COMPARISON:  09/14/2022 FINDINGS: Right Port-A-Cath remains in place, unchanged. Heart and mediastinal contours are within normal limits. Linear densities at the right lung base, likely atelectasis. Left lung clear. No effusions or acute bony abnormality. IMPRESSION: Right base atelectasis.  No active disease. Electronically Signed   By: Charlett Nose M.D.   On: 05/29/2023 00:30    EKG: I independently viewed the EKG done and my findings are as followed: Sinus tachycardia at a rate of 105 bpm  Assessment/Plan Present on Admission:  Acute exacerbation of chronic obstructive pulmonary disease (COPD) (HCC)  GERD without esophagitis  Squamous cell lung cancer (HCC)  Principal Problem:   Acute exacerbation of chronic obstructive pulmonary disease (COPD) (HCC) Active Problems:   GERD without esophagitis   Squamous cell lung cancer (HCC)   Elevated brain natriuretic peptide (BNP) level   Acute respiratory failure with hypoxia (HCC)   Acquired hypothyroidism  Acute exacerbation of COPD Continue duo nebs, Mucinex, Solu-Medrol, azithromycin. Continue Protonix to prevent steroid-induced ulcer Continue incentive spirometry and flutter valve  Acute respiratory failure with hypoxia Continue supplemental oxygen to maintain O2 sat > 92% with plan to wean patient off oxygen as tolerated.  Patient does not use oxygen at baseline  Elevated BNP r/o CHF BNP 116 Continue total input/output, daily weights and fluid restriction Continue  heart healthy diet  Echocardiogram done in May 2021 showed LVEF of  60 to 70%.  No RWMA.  LV diastolic parameters were normal.  Echocardiogram will be done in the morning   Stage II squamous cell cancer of left lower lobe Supraglottic squamous cell carcinoma Stable, patient  follows with Dr. Ellin Saba with last visit pain 05/08/2023  Acquired hypothyroidism Continue Synthroid  GERD Continue Protonix   DVT prophylaxis: Lovenox  Code Status: Full code  Consults: None  Family Communication: Sister at bedside (all questions answered to satisfaction)  Severity of Illness: The appropriate patient status for this patient is OBSERVATION. Observation status is judged to be reasonable and necessary in order to provide the required intensity of service to ensure the patient's safety. The patient's presenting symptoms, physical exam findings, and initial radiographic and laboratory data in the context of their medical condition is felt to place them at decreased risk for further clinical deterioration. Furthermore, it is anticipated that the patient will be medically stable for discharge from the hospital within 2 midnights of admission.   Author: Frankey Shown, DO 05/29/2023 4:38 AM  For on call review [email or web address].

## 2023-05-29 NOTE — ED Notes (Signed)
Assisted pt on bedpan. Gave call bell and now resting.

## 2023-05-29 NOTE — Progress Notes (Signed)
Regina Mcmahon is a 72 y.o. 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  She complained of 3-day onset of productive cough with greenish sputum which has been worsening.  Patient was admitted for acute hypoxemic respiratory failure secondary to COPD exacerbation.  Patient seen and evaluated at bedside and she has been admitted after midnight.  She is fairly somnolent this morning, but overall states that she is feeling better this morning.  Continue medications as ordered with IV Solu-Medrol as well as breathing treatments and continue to monitor.  Total care time: 25 minutes.

## 2023-05-29 NOTE — Progress Notes (Signed)
*  PRELIMINARY RESULTS* Echocardiogram 2D Echocardiogram has been performed with [facility].  Stacey Drain 05/29/2023, 1:29 PM

## 2023-05-29 NOTE — ED Provider Notes (Signed)
[place] EMERGENCY DEPARTMENT AT [facility] Provider Note  CSN: [id number] Arrival date & time: 05/28/23 2342  Chief Complaint(s) Shortness of Breath  HPI Regina Mcmahon is a 72 y.o. female with past medical history as below, significant for asthma, COPD, GERD, hypertension, squamous cell carcinoma of the lung, supraglottic neoplasm, anxiety who presents to the ED with complaint of dyspnea, cough, chest pain  Patient reports she been having productive cough with green sputum over the past 3 days.  Gradually worsening.  Reports this evening she began having midsternal chest pain that does not radiate, aching, sharp sensation at times.  Difficulty breathing.  Becomes winded with talking or ambulating just a few steps.  Does not wear home oxygen or CPAP.  No fevers or chills.  No body aches.  No sick contacts.  She is compliant with home medication regimen  Past Medical History Past Medical History:  Diagnosis Date   Arthritis    Asthma    Asymptomatic varicose veins    Breast cancer (HCC) 06/2014   left   Cancer (HCC)    Supra Glottis   Chronic airway obstruction (HCC)    Complication of anesthesia 2016   difficulty waking up, Oxygen dropped low.   COPD (chronic obstructive pulmonary disease) (HCC)    Depression    Dyspnea    GERD (gastroesophageal reflux disease)    Hemorrhage rectum    and anus.   Hypertension    Insomnia    Other symptoms involving digestive system(787.99)    Pain    Hx.pain in joint involving pelvic region and thigh; pain in limb   Palpitations    Panic attacks    Personal history of chemotherapy 2016   Personal history of radiation therapy 2016   Pneumonia    Pre-diabetes    Prediabetes    Sinusitis    Symptomatic menopausal or female climacteric states    Varicose veins of both lower extremities with pain    Patient Active Problem List   Diagnosis Date Noted   Acute exacerbation of chronic obstructive pulmonary disease (COPD) (HCC)  05/29/2023   Squamous cell lung cancer (HCC) 08/31/2021   Pulmonary nodules 08/23/2021   Mediastinal adenopathy 08/06/2021   Thyroid nodule 08/06/2021   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   Abnormal CT of the chest 07/29/2021   History of total right hip arthroplasty 04/15/2021   COPD GOLD II  /  still smoking  10/12/2020   Shortness of breath 09/12/2016   Well woman exam with routine gynecological exam 04/20/2016   Morbid obesity due to excess calories (HCC) 03/03/2016   Colon adenomas 03/03/2016   Rash 01/18/2016   GERD (gastroesophageal reflux disease) 10/19/2015   Osteopenia determined by x-ray 09/07/2015   High risk medication use 09/07/2015   Genetic testing 03/20/2015   Family history of ovarian cancer 03/20/2015   Family history of colon cancer 03/20/2015   Family history of cancer 03/20/2015   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 Authorizing Provider  albuterol (VENTOLIN HFA) 108 (90 Base) MCG/ACT inhaler Inhale 2 puffs into the lungs every 4 (four) hours as needed. 07/04/22   [provider]  ALPRAZolam (XANAX) 0.5 MG tablet Take 0.5 mg by mouth 3 (three) times daily.    [provider]  amoxicillin-clavulanate (AUGMENTIN) 875-125 MG tablet Take 1 tablet by mouth 2 (two) times daily. 12/15/22   Doreatha Massed, MD  ANORO ELLIPTA 62.5-25 MCG/INH AEPB Inhale 1 puff into the lungs daily. 10/17/19   [provider]  calcium carbonate (TUMS - DOSED IN MG ELEMENTAL CALCIUM) 500 MG chewable tablet Chew 2 tablets by mouth 3 (three) times daily as needed for indigestion or heartburn.    [provider]  fluconazole (DIFLUCAN) 100 MG tablet Take 2 tablets today, then 1 tablet daily x 20 more days. 08/30/21   Lonie Peak, MD  furosemide (LASIX) 20 MG tablet Take 20 mg by mouth daily.    [provider]  hydrochlorothiazide (HYDRODIURIL) 12.5 MG tablet Take 12.5 mg by mouth daily. 12/25/20   [provider]  HYDROcodone-acetaminophen (HYCET) 7.5-325 mg/15 ml solution Take 15 mLs by mouth every 6 (six) hours as needed for moderate pain (pain score 4-6). 05/17/23   Doreatha Massed, MD  ibuprofen (ADVIL) 100 MG/5ML suspension Take 10 mLs (200 mg total) by mouth every 4 (four) hours as needed for moderate pain. 01/23/23   Doreatha Massed, MD  levothyroxine (SYNTHROID) 100 MCG tablet TAKE 1 TABLET BY MOUTH ONCE DAILY BEFORE BREAKFAST 05/01/23   Doreatha Massed, MD  lidocaine (XYLOCAINE) 2 % solution Patient: Mix 1part 2% viscous lidocaine, 1part H20. Swallow 10mL of diluted mixture, before meals and at bedtime, up to QID for sore throat. 12/23/21   [name], MD  lidocaine-prilocaine (EMLA) cream APPLY CREAM TOPICALLY AS NEEDED 01/30/23   Doreatha Massed, MD  Menthol, Topical Analgesic, (BIOFREEZE EX) Apply 1 application topically daily as needed (pain).    [provider]  nystatin (MYCOSTATIN) 100000 UNIT/ML suspension Take 5 mLs (500,000 Units total) by mouth 4 (four) times daily. 11/09/21   Briant Cedar, PA-C  ondansetron (ZOFRAN) 8 MG tablet Take 1 tablet (8 mg total) by mouth every 8 (eight) hours as needed for nausea or vomiting. 11/09/21   Briant Cedar, PA-C  pantoprazole (PROTONIX) 40 MG tablet Take 1 tablet by mouth once daily 01/03/20   Anice Paganini, NP  polyethylene glycol (MIRALAX / GLYCOLAX) 17 g packet Take 17 g by mouth daily.    [provider]  potassium chloride (KLOR-CON) 20 MEQ packet Take 20 mEq by mouth 2 (two) times daily. Dissolve in at least 5 ounces cold water or other beverage and take after meals 10/04/22    Doreatha Massed, MD  prochlorperazine (COMPAZINE) 10 MG tablet Take 1 tablet (10 mg total) by mouth every 6 (six) hours as needed for nausea or vomiting. 11/09/21   Briant Cedar, PA-C  sertraline (ZOLOFT) 100 MG tablet Take 100 mg by mouth daily. 07/23/19   [provider]  sucralfate (CARAFATE) 1 g tablet Dissolve 1 tablet in 10 mL H20 and swallow 30 min prior to meals and bedtime. 11/12/21   Lonie Peak, MD  Vitamin D, Ergocalciferol, 50000 units CAPS Take 1 capsule by mouth once a week.    [provider]  Past Surgical History Past Surgical History:  Procedure Laterality Date   BREAST BIOPSY Left 10/2012   BREAST BIOPSY Left 07/01/2014   malignant   BREAST LUMPECTOMY Left 08/20/2014   BRONCHIAL BIOPSY  08/23/2021   Procedure: BRONCHIAL BIOPSIES;  Surgeon: Leslye Peer, MD;  Location: [facility] ENDOSCOPY;  Service: Pulmonary;;   BRONCHIAL BRUSHINGS  08/23/2021   Procedure: BRONCHIAL BRUSHINGS;  Surgeon: Leslye Peer, MD;  Location: [facility] ENDOSCOPY;  Service: Pulmonary;;   BRONCHIAL NEEDLE ASPIRATION BIOPSY  08/23/2021   Procedure: BRONCHIAL NEEDLE ASPIRATION BIOPSIES;  Surgeon: Leslye Peer, MD;  Location: [facility] ENDOSCOPY;  Service: Pulmonary;;   BRONCHIAL WASHINGS  08/23/2021   Procedure: BRONCHIAL WASHINGS;  Surgeon: Leslye Peer, MD;  Location: [facility] ENDOSCOPY;  Service: Pulmonary;;   CESAREAN SECTION     CHOLECYSTECTOMY  1985   COLONOSCOPY  2002   Dr. Cleotis Nipper: internal hemorrhoids, one small rectal polyp, adenomatous path    COLONOSCOPY N/A 11/23/2015   Procedure: COLONOSCOPY;  Surgeon: West Bali, MD;  Location: [facility];  Service: Endoscopy;  Laterality: N/A;  1100 - moved to 10:45 - office to notify   ESOPHAGOGASTRODUODENOSCOPY N/A 11/23/2015   Procedure: ESOPHAGOGASTRODUODENOSCOPY (EGD);  Surgeon: West Bali, MD;   Location: [facility];  Service: Endoscopy;  Laterality: N/A;   IR IMAGING GUIDED PORT INSERTION  11/08/2021   PORT-A-CATH REMOVAL  02/2015   PORTACATH PLACEMENT Right 09/17/2014   Procedure: INSERTION PORT-A-CATH WITH ULTRASOUND;  Surgeon: Harriette Bouillon, MD;  Location: [facility];  Service: General;  Laterality: Right;  IJ   RADIOACTIVE SEED GUIDED PARTIAL MASTECTOMY WITH AXILLARY SENTINEL LYMPH NODE BIOPSY Left 08/20/2014   Procedure: LEFT BREAST LUMPECTOMY WITH RADIOACTIVE SEED LOCALIZATION AND SENTINEL LYMPH NODE MAPPING;  Surgeon: Harriette Bouillon, MD;  Location: [facility];  Service: General;  Laterality: Left;   RE-EXCISION OF BREAST LUMPECTOMY Left 09/17/2014   Procedure: RE-EXCISION OF BREAST LUMPECTOMY;  Surgeon: Harriette Bouillon, MD;  Location: [facility];  Service: General;  Laterality: Left;   TOTAL HIP ARTHROPLASTY Right 02/22/2021   Procedure: RIGHT TOTAL HIP ARTHROPLASTY ANTERIOR APPROACH;  Surgeon: Eldred Manges, MD;  Location: [facility] OR;  Service: Orthopedics;  Laterality: Right;   TUBAL LIGATION     VIDEO BRONCHOSCOPY WITH ENDOBRONCHIAL ULTRASOUND N/A 08/23/2021   Procedure: VIDEO BRONCHOSCOPY WITH ENDOBRONCHIAL ULTRASOUND;  Surgeon: Leslye Peer, MD;  Location: [facility];  Service: Pulmonary;  Laterality: N/A;   VIDEO BRONCHOSCOPY WITH RADIAL ENDOBRONCHIAL ULTRASOUND  08/23/2021   Procedure: VIDEO BRONCHOSCOPY WITH RADIAL ENDOBRONCHIAL ULTRASOUND;  Surgeon: Leslye Peer, MD;  Location: [facility];  Service: Pulmonary;;   Family History Family History  Problem Relation Age of Onset   Diabetes Mother    Ovarian cancer Mother 48   Other Father        died in MVA at age 71   Other Sister        mitral valve disorder   Melanoma Sister 72       removed from back of leg   Colon cancer Brother        early 68s, succumbed to disease   Cancer Paternal Aunt        leukemia, bone, or lung cancer   Alzheimer's disease Maternal  Grandmother    Heart attack Maternal Grandfather    Heart attack Paternal Grandmother    COPD Paternal Grandfather    Emphysema Paternal Grandfather    Congestive Heart Failure Paternal Aunt  Stomach cancer Paternal Uncle    Kidney cancer Maternal Uncle    Cancer Cousin        dx. teens/dx. 40s   Cancer Cousin    Colon cancer Cousin     Social History Social History   Tobacco Use   Smoking status: Former    Current packs/day: 0.00    Average packs/day: 1 pack/day for 42.0 years (42.0 ttl pk-yrs)    Types: Cigarettes    Start date: 02/23/1979    Quit date: 02/22/2021    Years since quitting: 2.2   Smokeless tobacco: Never  Vaping Use   Vaping status: Never Used  Substance Use Topics   Alcohol use: No    Alcohol/week: 0.0 standard drinks of alcohol   Drug use: No   Allergies Patient has no known allergies.  Review of Systems Review of Systems  Constitutional:  Negative for chills and fever.  HENT:  Negative for congestion.   Respiratory:  Positive for cough, shortness of breath and wheezing.   Cardiovascular:  Positive for chest pain. Negative for palpitations and leg swelling.  Gastrointestinal:  Negative for abdominal pain, nausea and vomiting.  Genitourinary:  Negative for dysuria.  Musculoskeletal:  Negative for back pain.  Skin:  Negative for rash.  Neurological:  Negative for numbness and headaches.  Psychiatric/Behavioral:  Negative for agitation.   All other systems reviewed and are negative.   Physical Exam Vital Signs  I have reviewed the triage vital signs BP 129/82   Pulse 94   Temp 99.4 F (37.4 C) (Oral)   Resp (!) 26   Ht 5\' 3"  (1.6 m)   Wt 91 kg   SpO2 96%   BMI 35.54 kg/m  Physical Exam Vitals and nursing note reviewed.  Constitutional:      General: She is not in acute distress.    Appearance: Normal appearance. She is well-developed. She is obese.  HENT:     Head: Normocephalic and atraumatic.     Right Ear: External ear normal.      Left Ear: External ear normal.     Nose: Nose normal.     Mouth/Throat:     Mouth: Mucous membranes are moist.  Eyes:     General: No scleral icterus.       Right eye: No discharge.        Left eye: No discharge.  Cardiovascular:     Rate and Rhythm: Regular rhythm. Tachycardia present.     Pulses: Normal pulses.     Heart sounds: Normal heart sounds.  Pulmonary:     Effort: Tachypnea and respiratory distress present.     Breath sounds: No stridor. Decreased breath sounds and wheezing present.     Comments: Conversational dyspnea Chest:    Abdominal:     General: Abdomen is flat. There is no distension.     Palpations: Abdomen is soft.     Tenderness: There is no abdominal tenderness.  Musculoskeletal:     Cervical back: No rigidity.     Right lower leg: No edema.     Left lower leg: No edema.  Skin:    General: Skin is warm and dry.     Capillary Refill: Capillary refill takes less than 2 seconds.  Neurological:     Mental Status: She is alert.  Psychiatric:        Mood and Affect: Mood normal.        Behavior: Behavior normal. Behavior is cooperative.  ED Results and Treatments Labs (all labs ordered are listed, but only abnormal results are displayed) Labs Reviewed  BRAIN NATRIURETIC PEPTIDE - Abnormal; Notable for the following components:      Result Value   B Natriuretic Peptide 116.0 (*)    All other components within normal limits  CBC WITH DIFFERENTIAL/PLATELET - Abnormal; Notable for the following components:   WBC 11.8 (*)    Neutro Abs 10.8 (*)    Lymphs Abs 0.4 (*)    All other components within normal limits  COMPREHENSIVE METABOLIC PANEL - Abnormal; Notable for the following components:   Glucose, Bld 155 (*)    Total Bilirubin 2.5 (*)    All other components within normal limits  RESP PANEL BY RT-PCR (RSV, FLU A&B, COVID)  [id number]  LACTIC ACID, PLASMA  LACTIC ACID, PLASMA  TROPONIN I (HIGH SENSITIVITY)  TROPONIN I (HIGH SENSITIVITY)                                                                                                                           Radiology DG Chest Portable 1 View  Result Date: 05/29/2023 CLINICAL DATA:  Shortness of breath EXAM: PORTABLE CHEST 1 VIEW COMPARISON:  09/14/2022 FINDINGS: Right Port-A-Cath remains in place, unchanged. Heart and mediastinal contours are within normal limits. Linear densities at the right lung base, likely atelectasis. Left lung clear. No effusions or acute bony abnormality. IMPRESSION: Right base atelectasis.  No active disease. Electronically Signed   By: Charlett Nose M.D.   On: 05/29/2023 00:30    Pertinent labs & imaging results that were available during my care of the patient were reviewed by me and considered in my medical decision making (see MDM for details).  Medications Ordered in ED Medications  albuterol (VENTOLIN HFA) 108 (90 Base) MCG/ACT inhaler 2 puff (has no administration in time range)  magnesium sulfate IVPB 2 g 50 mL (has no administration in time range)  ipratropium-albuterol (DUONEB) 0.5-2.5 (3) MG/3ML nebulizer solution 3 mL (3 mLs Nebulization Given 05/29/23 0040)  methylPREDNISolone sodium succinate (SOLU-MEDROL) 125 mg/2 mL injection 62.5 mg (62.5 mg Intravenous Given 05/29/23 0224)  sodium chloride 0.9 % bolus 500 mL (500 mLs Intravenous New Bag/Given 05/29/23 0230)  cefTRIAXone (ROCEPHIN) 1 g in sodium chloride 0.9 % 100 mL IVPB (1 g Intravenous New Bag/Given 05/29/23 0231)  ipratropium-albuterol (DUONEB) 0.5-2.5 (3) MG/3ML nebulizer solution 3 mL (3 mLs Nebulization Given 05/29/23 0257)  Procedures .Critical Care  Performed by: Sloan Leiter, DO Authorized by: Sloan Leiter, DO   Critical care provider statement:    Critical care time (minutes):  30   Critical care time was exclusive of:  Separately billable  procedures and treating other patients   Critical care was necessary to treat or prevent imminent or life-threatening deterioration of the following conditions:  Respiratory failure   Critical care was time spent personally by me on the following activities:  Development of treatment plan with patient or surrogate, discussions with consultants, evaluation of patient's response to treatment, examination of patient, ordering and review of laboratory studies, ordering and review of radiographic studies, ordering and performing treatments and interventions, pulse oximetry, re-evaluation of patient's condition, review of old charts and obtaining history from patient or surrogate   Care discussed with: admitting provider     (including critical care time)  Medical Decision Making / ED Course    Medical Decision Making:    TIFFNAY BOSSI is a 72 y.o. female with past medical history as below, significant for asthma, COPD, GERD, hypertension, squamous cell carcinoma of the lung, supraglottic neoplasm, anxiety who presents to the ED with complaint of dyspnea, cough, chest pain. The complaint involves an extensive differential diagnosis and also carries with it a high risk of complications and morbidity.  Serious etiology was considered. Ddx includes but is not limited to: In my evaluation of this patient's dyspnea my DDx includes, but is not limited to, pneumonia, pulmonary embolism, pneumothorax, pulmonary edema, metabolic acidosis, asthma, COPD, cardiac cause, anemia, anxiety, etc.  Differential includes all life-threatening causes for chest pain. This includes but is not exclusive to acute coronary syndrome, aortic dissection, pulmonary embolism, cardiac tamponade, community-acquired pneumonia, pericarditis, musculoskeletal chest wall pain, etc.   Complete initial physical exam performed, notably the patient  was conversational dyspnea on arrival, hypoxia to low 80s on room air.  Placed on 5 L nasal  cannula improvement.    Reviewed and confirmed nursing documentation for past medical history, family history, social history.  Vital signs reviewed.    Clinical Course as of 05/29/23 0331  Mon May 29, 2023  0209 Resp status improving, wheezing now much more audible now, air movement improving. Still requiring 4L [place] [SG]    Clinical Course User Index [SG] Tanda Rockers A, DO     In mild resp distress on arrival, hypoxia/conversational dyspnea; improved with supplemental oxygen.  Will collect screening labs, chest x-ray, give nebulized breathing treatment and steroids.  Patient will require admission   Labs reviewed, she has slight leukocytosis, she is not septic.  Troponin negative, BNP is minimally elevated.  Does not appear to be overtly volume overloaded.  Chest x-ray reviewed, no obvious infiltrate.  PE was considered, she has history of malignancy.  However she has diffuse wheezing, productive cough with colored sputum, precipitous improvement with DuoNebs, low grade temp.  No evidence of DVT on exam. COPD seems much more likely than acute PE at this time  History of COPD, worsening cough with increased phlegm, diffuse wheezing on arrival.  Concern for COPD exacerbation.  Respiratory status improving with nebulized breathing treatments.  Given mag sulfate and empiric Rocephin as well.  She continues to require 4 L nasal cannula.  Recommend admission for COPD exacerbation. Pt/family agreeable   Dr Thomes Dinning accepting               Additional history obtained: -Additional history obtained from family -External records from outside source  obtained and reviewed including: Chart review including previous notes, labs, imaging, consultation notes including  Primary care documentation   Lab Tests: -I ordered, reviewed, and interpreted labs.   The pertinent results include:   Labs Reviewed  BRAIN NATRIURETIC PEPTIDE - Abnormal; Notable for the following components:       Result Value   B Natriuretic Peptide 116.0 (*)    All other components within normal limits  CBC WITH DIFFERENTIAL/PLATELET - Abnormal; Notable for the following components:   WBC 11.8 (*)    Neutro Abs 10.8 (*)    Lymphs Abs 0.4 (*)    All other components within normal limits  COMPREHENSIVE METABOLIC PANEL - Abnormal; Notable for the following components:   Glucose, Bld 155 (*)    Total Bilirubin 2.5 (*)    All other components within normal limits  RESP PANEL BY RT-PCR (RSV, FLU A&B, COVID)  [id number]  LACTIC ACID, PLASMA  LACTIC ACID, PLASMA  TROPONIN I (HIGH SENSITIVITY)  TROPONIN I (HIGH SENSITIVITY)    Notable for as above  EKG   EKG Interpretation Date/Time:  Sunday May 28 2023 23:58:47 EST Ventricular Rate:  105 PR Interval:  159 QRS Duration:  85 QT Interval:  349 QTC Calculation: 462 R Axis:   -59  Text Interpretation: Sinus tachycardia Left axis deviation Low voltage, extremity and precordial leads Consider anterior infarct Confirmed by Tanda Rockers ([id number]) on 05/29/2023 12:08:14 AM         Imaging Studies ordered: I ordered imaging studies including CXR I independently visualized the following imaging with scope of interpretation limited to determining acute life threatening conditions related to emergency care; findings noted above I independently visualized and interpreted imaging. I agree with the radiologist interpretation   Medicines ordered and prescription drug management: Meds ordered this encounter  Medications   albuterol (VENTOLIN HFA) 108 (90 Base) MCG/ACT inhaler 2 puff   ipratropium-albuterol (DUONEB) 0.5-2.5 (3) MG/3ML nebulizer solution 3 mL   methylPREDNISolone sodium succinate (SOLU-MEDROL) 125 mg/2 mL injection 62.5 mg   sodium chloride 0.9 % bolus 500 mL   magnesium sulfate IVPB 2 g 50 mL   cefTRIAXone (ROCEPHIN) 1 g in sodium chloride 0.9 % 100 mL IVPB    Order Specific Question:   Antibiotic Indication:    Answer:   CAP    ipratropium-albuterol (DUONEB) 0.5-2.5 (3) MG/3ML nebulizer solution 3 mL    -I have reviewed the patients home medicines and have made adjustments as needed   Consultations Obtained: na   Cardiac Monitoring: The patient was maintained on a cardiac monitor.  I personally viewed and interpreted the cardiac monitored which showed an underlying rhythm of: NSR Continuous pulse oximetry interpreted by myself, 83% on RA, now at 95% on 4L   Social Determinants of Health:  Diagnosis or treatment significantly limited by social determinants of health: former smoker   Reevaluation: After the interventions noted above, I reevaluated the patient and found that they have improved  Co morbidities that complicate the patient evaluation  Past Medical History:  Diagnosis Date   Arthritis    Asthma    Asymptomatic varicose veins    Breast cancer (HCC) 06/2014   left   Cancer (HCC)    Supra Glottis   Chronic airway obstruction (HCC)    Complication of anesthesia 2016   difficulty waking up, Oxygen dropped low.   COPD (chronic obstructive pulmonary disease) (HCC)    Depression    Dyspnea    GERD (gastroesophageal reflux disease)  Hemorrhage rectum    and anus.   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    Final Clinical Impression(s) / ED Diagnoses Final diagnoses:  Respiratory distress  COPD exacerbation (HCC)        Sloan Leiter, DO 05/29/23  [phone]

## 2023-05-30 ENCOUNTER — Other Ambulatory Visit: Payer: Self-pay

## 2023-05-30 DIAGNOSIS — J441 Chronic obstructive pulmonary disease with (acute) exacerbation: Secondary | ICD-10-CM | POA: Diagnosis not present

## 2023-05-30 DIAGNOSIS — J449 Chronic obstructive pulmonary disease, unspecified: Secondary | ICD-10-CM | POA: Diagnosis not present

## 2023-05-30 LAB — COMPREHENSIVE METABOLIC PANEL
ALT: 12 U/L (ref 0–44)
AST: 14 U/L — ABNORMAL LOW (ref 15–41)
Albumin: 3.6 g/dL (ref 3.5–5.0)
Alkaline Phosphatase: 61 U/L (ref 38–126)
Anion gap: 9 (ref 5–15)
BUN: 25 mg/dL — ABNORMAL HIGH (ref 8–23)
CO2: 25 mmol/L (ref 22–32)
Calcium: 9.2 mg/dL (ref 8.9–10.3)
Chloride: 101 mmol/L (ref 98–111)
Creatinine, Ser: 1 mg/dL (ref 0.44–1.00)
GFR, Estimated: >60 mL/min (ref >60)
Glucose, Bld: 188 mg/dL — ABNORMAL HIGH (ref 70–99)
Potassium: 3.7 mmol/L (ref 3.5–5.1)
Sodium: 135 mmol/L (ref 135–145)
Total Bilirubin: 1.8 mg/dL — ABNORMAL HIGH (ref <1.2)
Total Protein: 6.9 g/dL (ref 6.5–8.1)

## 2023-05-30 LAB — CBC
HCT: 42 % (ref 36.0–46.0)
Hemoglobin: 14.1 g/dL (ref 12.0–15.0)
MCH: 30.1 pg (ref 26.0–34.0)
MCHC: 33.6 g/dL (ref 30.0–36.0)
MCV: 89.7 fL (ref 80.0–100.0)
Platelets: 203 10*3/uL (ref 150–400)
RBC: 4.68 MIL/uL (ref 3.87–5.11)
RDW: 12.6 % (ref 11.5–15.5)
WBC: 12.8 10*3/uL — ABNORMAL HIGH (ref 4.0–10.5)
nRBC: 0 % (ref 0.0–0.2)

## 2023-05-30 LAB — MAGNESIUM: Magnesium: 2.4 mg/dL (ref 1.7–2.4)

## 2023-05-30 MED ORDER — PREDNISONE 10 MG PO TABS: 40.0000 mg | ORAL_TABLET | Freq: Every day | ORAL | 0 refills | Status: AC

## 2023-05-30 MED ORDER — HYDROCODONE-ACETAMINOPHEN 7.5-325 MG/15ML PO SOLN: 15.0000 mL | Freq: Four times a day (QID) | ORAL | 0 refills | Status: DC | PRN

## 2023-05-30 MED ORDER — AZITHROMYCIN 250 MG PO TABS: 250.0000 mg | ORAL_TABLET | Freq: Every day | ORAL | 0 refills | Status: AC

## 2023-05-30 NOTE — Discharge Summary (Signed)
Physician Discharge Summary  MARRANDA ARAKELIAN YQI:[id number] DOB: 04-11-1951 DOA: 05/28/2023  PCP: Benita Stabile, MD  Admit date: 05/28/2023  Discharge date: 05/30/2023  Admitted From:Home  Disposition:  Home  Recommendations for Outpatient Follow-up:  Follow up with PCP in 1-2 weeks Continue breathing treatments as needed for shortness of breath or wheezing Continue prednisone as ordered for 5 more days as well as azithromycin for 3 more days Continue other home medications as prior  Home Health: None  Equipment/Devices: Home 3 L nasal cannula oxygen  Discharge Condition:Stable  CODE STATUS: Full  Diet recommendation: Heart Healthy  Brief/Interim Summary: NERA HAWORTH is a 72 y.o. 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  She complained of 3-day onset of productive cough with greenish sputum which has been worsening.  Patient was admitted for acute hypoxemic respiratory failure secondary to COPD exacerbation.  She is overall much improved today and is requiring some oxygen with ambulation and will be set up with 3 L nasal cannula at home.  She will need to continue on medications as noted above as well as home breathing treatments as needed.  No other acute events or concerns noted throughout the course of this admission.  Discharge Diagnoses:  Principal Problem:   Acute exacerbation of chronic obstructive pulmonary disease (COPD) (HCC) Active Problems:   GERD without esophagitis   Squamous cell lung cancer (HCC)   Elevated brain natriuretic peptide (BNP) level   Acute respiratory failure with hypoxia (HCC)   Acquired hypothyroidism  Principal discharge diagnosis: Acute hypoxemic respiratory failure secondary to acute COPD exacerbation.  Discharge Instructions  Discharge Instructions      Diet - low sodium heart healthy   Complete by: As directed    Increase activity slowly   Complete by: As directed       Allergies as of 05/30/2023   No Known Allergies      Medication List     TAKE these medications    albuterol 108 (90 Base) MCG/ACT inhaler Commonly known as: VENTOLIN HFA Inhale 2 puffs into the lungs every 4 (four) hours as needed for shortness of breath.   ALPRAZolam 0.5 MG tablet Commonly known as: XANAX Take 0.5 mg by mouth 3 (three) times daily.   Anoro Ellipta 62.5-25 MCG/INH Aepb Generic drug: umeclidinium-vilanterol Inhale 1 puff into the lungs daily.   azithromycin 250 MG tablet Commonly known as: ZITHROMAX Take 1 tablet (250 mg total) by mouth daily for 3 days.   BIOFREEZE EX Apply 1 application topically daily as needed (pain).   calcium carbonate 500 MG chewable tablet Commonly known as: TUMS - dosed in mg elemental calcium Chew 2 tablets by mouth 3 (three) times daily as needed for indigestion or heartburn.   furosemide 20 MG tablet Commonly known as: LASIX Take 20 mg by mouth daily.   hydrochlorothiazide 12.5 MG tablet Commonly known as: HYDRODIURIL Take 12.5 mg by mouth daily.   HYDROcodone-acetaminophen 7.5-325 mg/15 ml solution Commonly known as: HYCET Take 15 mLs by mouth every 6 (six) hours as needed for moderate pain (pain score 4-6).   ibuprofen 100 MG/5ML suspension Commonly known as: ADVIL Take 10 mLs (200 mg total) by mouth every 4 (four) hours as needed for moderate pain.   levothyroxine 100 MCG tablet Commonly known as: SYNTHROID TAKE 1 TABLET BY MOUTH ONCE DAILY BEFORE  BREAKFAST   lidocaine 2 % solution Commonly known as: XYLOCAINE Patient: Mix 1part 2% viscous lidocaine, 1part H20. Swallow 10mL of diluted mixture, before meals and at bedtime, up to QID for sore throat.   lidocaine-prilocaine cream Commonly known as: EMLA APPLY CREAM TOPICALLY AS NEEDED   pantoprazole 40 MG tablet Commonly known as:  PROTONIX Take 1 tablet by mouth once daily   polyethylene glycol 17 g packet Commonly known as: MIRALAX / GLYCOLAX Take 17 g by mouth daily as needed for mild constipation.   predniSONE 10 MG tablet Commonly known as: DELTASONE Take 4 tablets (40 mg total) by mouth daily for 5 days.   sertraline 100 MG tablet Commonly known as: ZOLOFT Take 100 mg by mouth daily.               Durable Medical Equipment  (From admission, onward)           Start     Ordered   05/30/23 0947  For home use only DME oxygen  Once       Comments: POC for  COPD  Question Answer Comment  Length of Need Lifetime   Mode or (Route) Nasal cannula   Liters per Minute 3   Frequency Continuous (stationary and portable oxygen unit needed)   Oxygen conserving device Yes   Oxygen delivery system Gas      05/30/23 0946            Follow-up Information     Benita Stabile, MD. Schedule an appointment as soon as possible for a visit in 1 week(s).   Specialty: Internal Medicine Contact information: [address] [place] [address] [phone]                No Known Allergies  Consultations: None   Procedures/Studies: ECHOCARDIOGRAM COMPLETE  Result Date: 05/29/2023    ECHOCARDIOGRAM REPORT   Patient Name:   BEA DUREN Date of Exam: 05/29/2023 Medical Rec #:  [id number]       Height:       63.0 in Accession #:    [id number]      Weight:       207.0 lb Date of Birth:  August 30, 1950        BSA:          1.962 m Patient Age:    72 years        BP:           137/88 mmHg Patient Gender: F               HR:           90 bpm. [facility] Procedure: 2D Echo, Cardiac Doppler, Color Doppler and Intracardiac            Opacification Agent Indications:    Dyspnea R06.00  History:        Patient has prior history of Echocardiogram examinations, most                 recent 12/04/2019. COPD; Risk Factors:Hypertension. Squamous cell                 lung cancer (HCC), Hx of  smoking,                 Prediabetes (From Hx).  Sonographer:    Celesta Gentile RCS Referring Phys: [id number] OLADAPO ADEFESO IMPRESSIONS  1. Left ventricular ejection fraction, by estimation, is 70 to  75%. The left ventricle has hyperdynamic function. The left ventricle has no regional wall motion abnormalities. There is mild left ventricular hypertrophy. Left ventricular diastolic parameters are consistent with Grade I diastolic dysfunction (impaired relaxation).  2. Right ventricular systolic function is normal. The right ventricular size is normal. Tricuspid regurgitation signal is inadequate for assessing PA pressure.  3. The mitral valve is normal in structure. No evidence of mitral valve regurgitation. No evidence of mitral stenosis.  4. The aortic valve was not well visualized. Aortic valve regurgitation is not visualized. No aortic stenosis is present.  5. The inferior vena cava is normal in size with greater than 50% respiratory variability, suggesting right atrial pressure of 3 mmHg. FINDINGS  Left Ventricle: Left ventricular ejection fraction, by estimation, is 70 to 75%. The left ventricle has hyperdynamic function. The left ventricle has no regional wall motion abnormalities. Definity contrast agent was given IV to delineate the left ventricular endocardial borders. The left ventricular internal cavity size was normal in size. There is mild left ventricular hypertrophy. Left ventricular diastolic parameters are consistent with Grade I diastolic dysfunction (impaired relaxation). Normal left ventricular filling pressure. Right Ventricle: The right ventricular size is normal. Right vetricular wall thickness was not well visualized. Right ventricular systolic function is normal. Tricuspid regurgitation signal is inadequate for assessing PA pressure. Left Atrium: Left atrial size was normal in size. Right Atrium: Right atrial size was normal in size. Pericardium: Trivial pericardial effusion is present. The  pericardial effusion is circumferential. Mitral Valve: The mitral valve is normal in structure. No evidence of mitral valve regurgitation. No evidence of mitral valve stenosis. Tricuspid Valve: The tricuspid valve is not well visualized. Tricuspid valve regurgitation is not demonstrated. No evidence of tricuspid stenosis. Aortic Valve: The aortic valve was not well visualized. Aortic valve regurgitation is not visualized. No aortic stenosis is present. Aortic valve mean gradient measures 4.6 mmHg. Aortic valve peak gradient measures 11.0 mmHg. Aortic valve area, by VTI measures 2.54 cm. Pulmonic Valve: The pulmonic valve was not well visualized. Pulmonic valve regurgitation is trivial. No evidence of pulmonic stenosis. Aorta: The aortic root is normal in size and structure. Venous: The inferior vena cava is normal in size with greater than 50% respiratory variability, suggesting right atrial pressure of 3 mmHg. IAS/Shunts: No atrial level shunt detected by color flow Doppler.  LEFT VENTRICLE PLAX 2D LVIDd:         3.90 cm   Diastology LVIDs:         2.30 cm   LV e' medial:    7.72 cm/s LV PW:         1.10 cm   LV E/e' medial:  11.0 LV IVS:        1.10 cm   LV e' lateral:   8.92 cm/s LVOT diam:     2.00 cm   LV E/e' lateral: 9.5 LV SV:         67 LV SV Index:   34 LVOT Area:     3.14 cm  RIGHT VENTRICLE RV S prime:     16.20 cm/s TAPSE (M-mode): 2.0 cm LEFT ATRIUM             Index        RIGHT ATRIUM           Index LA diam:        3.50 cm 1.78 cm/m   RA Area:     13.80 cm LA Vol (A2C):   20.7  ml 10.55 ml/m  RA Volume:   30.70 ml  15.65 ml/m LA Vol (A4C):   39.6 ml 20.18 ml/m LA Biplane Vol: 32.1 ml 16.36 ml/m  AORTIC VALVE AV Area (Vmax):    2.12 cm AV Area (Vmean):   2.04 cm AV Area (VTI):     2.54 cm AV Vmax:           165.91 cm/s AV Vmean:          98.128 cm/s AV VTI:            0.263 m AV Peak Grad:      11.0 mmHg AV Mean Grad:      4.6 mmHg LVOT Vmax:         112.00 cm/s LVOT Vmean:        63.800  cm/s LVOT VTI:          0.212 m LVOT/AV VTI ratio: 0.81  AORTA Ao Root diam: 3.60 cm MITRAL VALVE MV Area (PHT): 1.89 cm     SHUNTS MV Decel Time: 401 msec     Systemic VTI:  0.21 m MV E velocity: 84.80 cm/s   Systemic Diam: 2.00 cm MV A velocity: 104.00 cm/s MV E/A ratio:  0.82 Dina Rich MD Electronically signed by Dina Rich MD Signature Date/Time: 05/29/2023/1:58:06 PM    Final    DG Chest Portable 1 View  Result Date: 05/29/2023 CLINICAL DATA:  Shortness of breath EXAM: PORTABLE CHEST 1 VIEW COMPARISON:  09/14/2022 FINDINGS: Right Port-A-Cath remains in place, unchanged. Heart and mediastinal contours are within normal limits. Linear densities at the right lung base, likely atelectasis. Left lung clear. No effusions or acute bony abnormality. IMPRESSION: Right base atelectasis.  No active disease. Electronically Signed   By: Charlett Nose M.D.   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 Probable tumor recurrence in enlarged and hypermetabolic lymph nodes within the right supraclavicular region and superior mediastinum. EXAM: ULTRASOUND GUIDED CORE BIOPSY OF RIGHT SUPRACLAVICULAR LYMPH NODE MEDICATIONS: None. ANESTHESIA/SEDATION: Moderate (conscious) sedation was employed during this procedure. A total of Versed 1.5 mg and Fentanyl 50 mcg was administered intravenously by radiology nursing. Moderate Sedation Time: 11 minutes. The patient's level of consciousness and vital signs were monitored continuously by radiology nursing throughout the procedure under my direct supervision. PROCEDURE: The procedure, risks, benefits, and alternatives were explained to the patient. Questions regarding the procedure were encouraged and answered. The patient understands and consents to the procedure. A time-out was performed prior to initiating the procedure. Ultrasound was used to localize a right  supraclavicular lymph node. The right supraclavicular region was prepped with exiting in a sterile fashion, and a sterile drape was applied covering the operative field. A sterile gown and sterile gloves were used for the procedure. Local anesthesia was provided with 1% Lidocaine. Under ultrasound guidance, an 18 gauge core biopsy device was utilized in obtaining 3 separate core biopsy samples through an enlarged right supraclavicular lymph node. Core biopsy samples were placed in formalin. Additional ultrasound was performed. COMPLICATIONS: None immediate. FINDINGS: Enlarged right supraclavicular lymph node measures approximately 3.3 x 1.8 x 2.2 cm by ultrasound. This corresponds to the enlarged lymph node seen by prior imaging and hypermetabolic lymph node by prior PET scan. Solid core biopsy samples were obtained. IMPRESSION: Ultrasound-guided core biopsy performed of an enlarged right supraclavicular lymph node measuring approximately 3.3 cm in greatest diameter. Electronically  Signed   By: Irish Lack M.D.   On: 05/25/2023 14:47   CT Chest W Contrast  Result Date: 05/08/2023 CLINICAL DATA:  Non-small cell lung cancer, restaging. Squamous cell carcinoma. * Tracking Code: BO * EXAM: CT CHEST WITH CONTRAST TECHNIQUE: Multidetector CT imaging of the chest was performed during intravenous contrast administration. RADIATION DOSE REDUCTION: This exam was performed according to the departmental dose-optimization program which includes automated exposure control, adjustment of the mA and/or kV according to patient size and/or use of iterative reconstruction technique. CONTRAST:  OMNIPAQUE IOHEXOL 300 MG/ML  SOLN COMPARISON:  Chest CT 01/25/2023 and 10/26/2022.  PET-CT 01/27/2022. FINDINGS: Cardiovascular: Right IJ Port-A-Cath extends to the superior cavoatrial junction. Atherosclerosis of the aorta, great vessels and coronary arteries. No acute vascular findings are demonstrated. Small pericardial  effusion appears unchanged. The heart size is normal. Mediastinum/Nodes: Interval enlargement of previously demonstrated small mediastinal lymph nodes consistent with progressive metastatic adenopathy. There is a right supraclavicular node measuring 3.7 x 2.0 cm on image 5/1 and a left superior mediastinal node measuring 1.1 cm short axis on image 22/1. Additional small hilar and mediastinal lymph nodes have enlarged compared with the previous study. No axillary adenopathy. No significant change in previously demonstrated heterogeneous right thyroid nodule measuring 3.8 x 2.9 cm on image 10/1, without hypermetabolic activity on previous PET-CT. Mass effect on the trachea is unchanged.The esophagus appears unremarkable. Lungs/Pleura: No pleural effusion or pneumothorax. Mild to moderate centrilobular and paraseptal emphysema with stable radiation changes bilaterally. Subpleural right middle lobe nodular density adjacent to the right epicardial fat appears similar, measuring 1.7 x 1.4 cm on image 83/1. Possible new 3 mm right lower lobe nodule on image 60/3. Stable 3 mm left upper lobe nodule on image 27/3. No other suspicious nodules. Upper abdomen: The visualized upper abdomen appears stable, without suspicious findings. Previous cholecystectomy. Musculoskeletal/Chest wall: Stable mixed sclerotic and lucent lesion within the T5 vertebral body. Multilevel thoracic spondylosis. Posttreatment changes in the left breast with calcifications and asymmetric dermal thickening are unchanged. Unless specific follow-up recommendations are mentioned in the findings or impression sections, no imaging follow-up of any mentioned incidental findings is recommended. IMPRESSION: 1. Progressive mediastinal and right supraclavicular adenopathy consistent with progressive metastatic disease. Consider tissue sampling of the right supraclavicular node or follow-up PET-CT for further evaluation. 2. The subpleural right middle lobe nodule  appears grossly stable and may be postinflammatory based on previous CTs. Possible new 3 mm right lower lobe nodule. 3. No other definite evidence of metastatic disease. 4. Stable heterogeneous right thyroid nodule which has been evaluated on previous imaging. 5. Stable small pericardial effusion. 6. Aortic Atherosclerosis (ICD10-I70.0) and Emphysema (ICD10-J43.9). Electronically Signed   By: Carey Bullocks M.D.   On: 05/08/2023 09:23   CT SOFT TISSUE NECK W CONTRAST  Result Date: 05/08/2023 CLINICAL DATA:  Stage III squamous cell cancer.  Follow-up. EXAM: CT NECK WITH CONTRAST TECHNIQUE: Multidetector CT imaging of the neck was performed using the standard protocol following the bolus administration of intravenous contrast. RADIATION DOSE REDUCTION: This exam was performed according to the departmental dose-optimization program which includes automated exposure control, adjustment of the mA and/or kV according to patient size and/or use of iterative reconstruction technique. CONTRAST:  OMNIPAQUE IOHEXOL 300 MG/ML  SOLN COMPARISON:  CT neck with contrast 08/03/2022 and 06/23/2021. PET scan 07/23/2021. CT neck with contrast 10/18/2021 FINDINGS: Pharynx and larynx: Post radiation changes are present at the glottis and supraglottic larynx. No residual or recurrent tumor is  present. The airways patent. The nasopharynx and oropharynx are within normal limits. The trachea is unremarkable. Salivary glands: The submandibular and parotid glands and ducts are within normal limits. Thyroid: A 2.8 cm right thyroid nodule is increasing in size. The left lobe is normal. Lymph nodes: No significant cervical adenopathy is present. Vascular: Atherosclerotic calcifications are again noted at the aortic arch, great vessel origins in the carotid bifurcations bilaterally without significant stenosis. Limited intracranial: Within normal limits. Visualized orbits: The globes and orbits are within normal limits. Mastoids and  visualized paranasal sinuses: The paranasal sinuses and mastoid air cells are clear. Skeleton: Heterogeneous sclerotic changes are present at T5 with a pathologic fracture involving the superior endplate. 35% loss of height is noted without retropulsion of bone. This is stable compared to the chest CT of 01/25/2023 and 10/26/2022. Upper chest: The lung apices are clear. IMPRESSION: 1. Post radiation changes at the glottis and supraglottic larynx without evidence for residual or recurrent tumor. 2. No significant cervical adenopathy. 3. Stable pathologic fracture at T5 with 35% loss of height. 4. Enlarging 2.8 cm right thyroid nodule. Recommend thyroid US. Although this lesion has been biopsied in the past, changes in appearance warrant follow-up ultrasound. (Ref: J Am Coll Radiol. 2015 Feb;12(2): 143-50). 5. Stable atherosclerotic changes. 6.  Aortic Atherosclerosis (ICD10-I70.0). These results will be called to the ordering clinician or representative by the Radiologist Assistant, and communication documented in the PACS or [employer]. Electronically Signed   By: Marin Roberts M.D.   On: 05/08/2023 09:00     Discharge Exam: Vitals:   05/30/23 0749 05/30/23 0809  BP: 113/84   Pulse: 70   Resp:    Temp:    SpO2: 94% 96%   Vitals:   05/30/23 0419 05/30/23 0500 05/30/23 0749 05/30/23 0809  BP: (!) 111/92  113/84   Pulse: 79  70   Resp: 20     Temp: 98.3 F (36.8 C)     TempSrc:      SpO2: 93%  94% 96%  Weight:  93.2 kg    Height:        General: Pt is alert, awake, not in acute distress Cardiovascular: RRR, S1/S2 +, no rubs, no gallops Respiratory: CTA bilaterally, no wheezing, no rhonchi, 3 L nasal cannula Abdominal: Soft, NT, ND, bowel sounds + Extremities: no edema, no cyanosis    The results of significant diagnostics from this hospitalization (including imaging, microbiology, ancillary and laboratory) are listed below for reference.     Microbiology: Recent Results  (from the past 240 hour(s))  Resp panel by RT-PCR (RSV, Flu A&B, Covid) Anterior Nasal Swab     Status: None   Collection Time: 05/29/23 12:30 AM   Specimen: Anterior Nasal Swab  Result Value Ref Range Status   SARS Coronavirus 2 by RT PCR NEGATIVE NEGATIVE Final    Comment: (NOTE) SARS-CoV-2 target nucleic acids are NOT DETECTED.  The SARS-CoV-2 RNA is generally detectable in upper respiratory specimens during the acute phase of infection. The lowest concentration of SARS-CoV-2 viral copies this assay can detect is 138 copies/mL. A negative result does not preclude SARS-Cov-2 infection and should not be used as the sole basis for treatment or other patient management decisions. A negative result may occur with  improper specimen collection/handling, submission of specimen other than nasopharyngeal swab, presence of viral mutation(s) within the areas targeted by this assay, and inadequate number of viral copies(<138 copies/mL). A negative result must be combined with clinical observations, patient  history, and epidemiological information. The expected result is Negative.  Fact Sheet for Patients:  BloggerCourse.com  Fact Sheet for Healthcare Providers:  SeriousBroker.it  This test is no t yet approved or cleared by the [place] FDA and  has been authorized for detection and/or diagnosis of SARS-CoV-2 by FDA under an Emergency Use Authorization (EUA). This EUA will remain  in effect (meaning this test can be used) for the duration of the COVID-19 declaration under Section 564(b)(1) of the Act, 21 U.S.C.section 360bbb-3(b)(1), unless the authorization is terminated  or revoked sooner.       Influenza A by PCR NEGATIVE NEGATIVE Final   Influenza B by PCR NEGATIVE NEGATIVE Final    Comment: (NOTE) The Xpert Xpress SARS-CoV-2/FLU/RSV plus assay is intended as an aid in the diagnosis of influenza from Nasopharyngeal swab  specimens and should not be used as a sole basis for treatment. Nasal washings and aspirates are unacceptable for Xpert Xpress SARS-CoV-2/FLU/RSV testing.  Fact Sheet for Patients: BloggerCourse.com  Fact Sheet for Healthcare Providers: SeriousBroker.it  This test is not yet approved or cleared by the [place] FDA and has been authorized for detection and/or diagnosis of SARS-CoV-2 by FDA under an Emergency Use Authorization (EUA). This EUA will remain in effect (meaning this test can be used) for the duration of the COVID-19 declaration under Section 564(b)(1) of the Act, 21 U.S.C. section 360bbb-3(b)(1), unless the authorization is terminated or revoked.     Resp Syncytial Virus by PCR NEGATIVE NEGATIVE Final    Comment: (NOTE) Fact Sheet for Patients: BloggerCourse.com  Fact Sheet for Healthcare Providers: SeriousBroker.it  This test is not yet approved or cleared by the [place] FDA and has been authorized for detection and/or diagnosis of SARS-CoV-2 by FDA under an Emergency Use Authorization (EUA). This EUA will remain in effect (meaning this test can be used) for the duration of the COVID-19 declaration under Section 564(b)(1) of the Act, 21 U.S.C. section 360bbb-3(b)(1), unless the authorization is terminated or revoked.  Performed at [facility], [address] [place], [place] [address]      Labs: BNP (last 3 results) Recent Labs    05/29/23 0032  BNP 116.0*   Basic Metabolic Panel: Recent Labs  Lab 05/29/23 0032 05/29/23 0458 05/30/23 0408  NA 136  --  135  K 3.9  --  3.7  CL 101  --  101  CO2 27  --  25  GLUCOSE 155*  --  188*  BUN 15  --  25*  CREATININE 0.93  --  1.00  CALCIUM 9.4  --  9.2  MG  --  2.8* 2.4  PHOS  --  2.9  --    Liver Function Tests: Recent Labs  Lab 05/29/23 0032 05/30/23 0408  AST 17 14*  ALT 13 12  ALKPHOS  67 61  BILITOT 2.5* 1.8*  PROT 7.4 6.9  ALBUMIN 3.9 3.6   No results for input(s): "LIPASE", "AMYLASE" in the last 168 hours. No results for input(s): "AMMONIA" in the last 168 hours. CBC: Recent Labs  Lab 05/29/23 0032 05/30/23 0408  WBC 11.8* 12.8*  NEUTROABS 10.8*  --   HGB 15.0 14.1  HCT 43.8 42.0  MCV 89.0 89.7  PLT 183 203   Cardiac Enzymes: No results for input(s): "CKTOTAL", "CKMB", "CKMBINDEX", "TROPONINI" in the last 168 hours. BNP: Invalid input(s): "POCBNP" CBG: Recent Labs  Lab 05/25/23 1149 05/25/23 1421  GLUCAP 100* 98   D-Dimer No results for input(s): "  DDIMER" in the last 72 hours. Hgb A1c No results for input(s): "HGBA1C" in the last 72 hours. Lipid Profile No results for input(s): "CHOL", "HDL", "LDLCALC", "TRIG", "CHOLHDL", "LDLDIRECT" in the last 72 hours. Thyroid function studies No results for input(s): "TSH", "T4TOTAL", "T3FREE", "THYROIDAB" in the last 72 hours.  Invalid input(s): "FREET3" Anemia work up No results for input(s): "VITAMINB12", "FOLATE", "FERRITIN", "TIBC", "IRON", "RETICCTPCT" in the last 72 hours. Urinalysis    Component Value Date/Time   COLORURINE YELLOW 06/14/2022 1331   APPEARANCEUR HAZY (A) 06/14/2022 1331   LABSPEC 1.025 06/14/2022 1331   PHURINE 5.0 06/14/2022 1331   GLUCOSEU NEGATIVE 06/14/2022 1331   HGBUR NEGATIVE 06/14/2022 1331   BILIRUBINUR NEGATIVE 06/14/2022 1331   KETONESUR NEGATIVE 06/14/2022 1331   PROTEINUR 30 (A) 06/14/2022 1331   UROBILINOGEN 0.2 10/10/2014 1515   NITRITE NEGATIVE 06/14/2022 1331   LEUKOCYTESUR LARGE (A) 06/14/2022 1331   Sepsis Labs Recent Labs  Lab 05/29/23 0032 05/30/23 0408  WBC 11.8* 12.8*   Microbiology Recent Results (from the past 240 hour(s))  Resp panel by RT-PCR (RSV, Flu A&B, Covid) Anterior Nasal Swab     Status: None   Collection Time: 05/29/23 12:30 AM   Specimen: Anterior Nasal Swab  Result Value Ref Range Status   SARS Coronavirus 2 by RT PCR NEGATIVE  NEGATIVE Final    Comment: (NOTE) SARS-CoV-2 target nucleic acids are NOT DETECTED.  The SARS-CoV-2 RNA is generally detectable in upper respiratory specimens during the acute phase of infection. The lowest concentration of SARS-CoV-2 viral copies this assay can detect is 138 copies/mL. A negative result does not preclude SARS-Cov-2 infection and should not be used as the sole basis for treatment or other patient management decisions. A negative result may occur with  improper specimen collection/handling, submission of specimen other than nasopharyngeal swab, presence of viral mutation(s) within the areas targeted by this assay, and inadequate number of viral copies(<138 copies/mL). A negative result must be combined with clinical observations, patient history, and epidemiological information. The expected result is Negative.  Fact Sheet for Patients:  BloggerCourse.com  Fact Sheet for Healthcare Providers:  SeriousBroker.it  This test is no t yet approved or cleared by the [place] FDA and  has been authorized for detection and/or diagnosis of SARS-CoV-2 by FDA under an Emergency Use Authorization (EUA). This EUA will remain  in effect (meaning this test can be used) for the duration of the COVID-19 declaration under Section 564(b)(1) of the Act, 21 U.S.C.section 360bbb-3(b)(1), unless the authorization is terminated  or revoked sooner.       Influenza A by PCR NEGATIVE NEGATIVE Final   Influenza B by PCR NEGATIVE NEGATIVE Final    Comment: (NOTE) The Xpert Xpress SARS-CoV-2/FLU/RSV plus assay is intended as an aid in the diagnosis of influenza from Nasopharyngeal swab specimens and should not be used as a sole basis for treatment. Nasal washings and aspirates are unacceptable for Xpert Xpress SARS-CoV-2/FLU/RSV testing.  Fact Sheet for Patients: BloggerCourse.com  Fact Sheet for Healthcare  Providers: SeriousBroker.it  This test is not yet approved or cleared by the [place] FDA and has been authorized for detection and/or diagnosis of SARS-CoV-2 by FDA under an Emergency Use Authorization (EUA). This EUA will remain in effect (meaning this test can be used) for the duration of the COVID-19 declaration under Section 564(b)(1) of the Act, 21 U.S.C. section 360bbb-3(b)(1), unless the authorization is terminated or revoked.     Resp Syncytial Virus by PCR NEGATIVE NEGATIVE  Final    Comment: (NOTE) Fact Sheet for Patients: BloggerCourse.com  Fact Sheet for Healthcare Providers: SeriousBroker.it  This test is not yet approved or cleared by the [place] FDA and has been authorized for detection and/or diagnosis of SARS-CoV-2 by FDA under an Emergency Use Authorization (EUA). This EUA will remain in effect (meaning this test can be used) for the duration of the COVID-19 declaration under Section 564(b)(1) of the Act, 21 U.S.C. section 360bbb-3(b)(1), unless the authorization is terminated or revoked.  Performed at [facility], [address], [place], [place] [address]      Time coordinating discharge: 35 minutes  SIGNED:   Erick Blinks, DO [facility] 05/30/2023, 10:14 AM  If 7PM-7AM, please contact night-coverage [email or web address]

## 2023-05-30 NOTE — Progress Notes (Addendum)
95%SATURATION QUALIFICATIONS: (This note is used to comply with regulatory documentation for home oxygen)  Patient Saturations on Room Air at Rest = 95%  Patient Saturations on Room Air while Ambulating = 86%  Patient Saturations on 3 Liters of oxygen while Ambulating = 93%  Please briefly explain why patient needs home oxygen:  O2 sats dropped to 86% on r/a during ambulation, will need O2.

## 2023-05-30 NOTE — Progress Notes (Deleted)
SATURATION QUALIFICATIONS: (This note is used to comply with regulatory documentation for home oxygen)    Patient Saturations on Room Air while Ambulating =86%  Patient Saturations on 3 Liters of oxygen while Ambulating = 92%

## 2023-05-30 NOTE — TOC Transition Note (Signed)
Transition of Care [facility]) - CM/SW Discharge Note   Patient Details  Name: Regina Mcmahon MRN: [id number] Date of Birth: 1950-09-01  [facility] of [facility]) CM/SW Contact:  Elliot Gault, LCSW Phone Number: 05/30/2023, 10:30 AM   Clinical Narrative:     Pt stable for dc per MD. Pt has orders for Home O2. Spoke with pt to review dc planning. CMS provider options for DME reviewed and referred as requested for home o2.  Portable tank in pt's room for dc. [employer] will make arrangements with pt to deliver home setup once pt is home.  Updated RN. No other TOC needs for dc.  Final next level of care: Home/Self Care Barriers to Discharge: Barriers Resolved   Patient Goals and CMS Choice CMS Medicare.gov Compare Post Acute Care list provided to:: Patient Choice offered to / list presented to : Patient  Discharge Placement                         Discharge Plan and Services Additional resources added to the After Visit Summary for                  DME Arranged: Oxygen DME Agency: Patsy Lager Date DME Agency Contacted: 05/30/23   Representative spoke with at DME Agency: Morrie Sheldon            Social Determinants of Health (SDOH) Interventions SDOH Screenings   Food Insecurity: No Food Insecurity (05/29/2023)  Housing: Low Risk  (05/29/2023)  Transportation Needs: No Transportation Needs (05/29/2023)  Utilities: Not At Risk (05/29/2023)  Depression (PHQ2-9): Low Risk  (08/05/2021)  Social Connections: Unknown (08/05/2021)  Tobacco Use: Medium Risk (05/29/2023)     Readmission Risk Interventions     No data to display

## 2023-05-30 NOTE — Plan of Care (Signed)
  Problem: Activity: Goal: Risk for activity intolerance will decrease Outcome: Progressing   Problem: Education: Goal: Knowledge of General Education information will improve Description: Including pain rating scale, medication(s)/side effects and non-pharmacologic comfort measures Outcome: Adequate for Discharge   Problem: Health Behavior/Discharge Planning: Goal: Ability to manage health-related needs will improve Outcome: Adequate for Discharge   Problem: Nutrition: Goal: Adequate nutrition will be maintained Outcome: Adequate for Discharge

## 2023-06-01 ENCOUNTER — Inpatient Hospital Stay: Payer: 59

## 2023-06-01 ENCOUNTER — Inpatient Hospital Stay: Payer: 59 | Attending: Hematology and Oncology | Admitting: Hematology

## 2023-06-01 VITALS — BP 143/80 | HR 84 | Temp 97.9°F | Resp 16 | Wt 205.8 lb

## 2023-06-01 DIAGNOSIS — Z87891 Personal history of nicotine dependence: Secondary | ICD-10-CM | POA: Diagnosis not present

## 2023-06-01 DIAGNOSIS — Z860101 Personal history of adenomatous and serrated colon polyps: Secondary | ICD-10-CM | POA: Insufficient documentation

## 2023-06-01 DIAGNOSIS — C321 Malignant neoplasm of supraglottis: Secondary | ICD-10-CM | POA: Insufficient documentation

## 2023-06-01 DIAGNOSIS — C3490 Malignant neoplasm of unspecified part of unspecified bronchus or lung: Secondary | ICD-10-CM

## 2023-06-01 DIAGNOSIS — Z853 Personal history of malignant neoplasm of breast: Secondary | ICD-10-CM | POA: Diagnosis not present

## 2023-06-01 DIAGNOSIS — R072 Precordial pain: Secondary | ICD-10-CM | POA: Diagnosis not present

## 2023-06-01 DIAGNOSIS — I700 Atherosclerosis of aorta: Secondary | ICD-10-CM | POA: Insufficient documentation

## 2023-06-01 DIAGNOSIS — I3139 Other pericardial effusion (noninflammatory): Secondary | ICD-10-CM | POA: Diagnosis not present

## 2023-06-01 DIAGNOSIS — Z79899 Other long term (current) drug therapy: Secondary | ICD-10-CM | POA: Diagnosis not present

## 2023-06-01 DIAGNOSIS — J432 Centrilobular emphysema: Secondary | ICD-10-CM | POA: Diagnosis not present

## 2023-06-01 DIAGNOSIS — E876 Hypokalemia: Secondary | ICD-10-CM | POA: Insufficient documentation

## 2023-06-01 DIAGNOSIS — C3432 Malignant neoplasm of lower lobe, left bronchus or lung: Secondary | ICD-10-CM | POA: Insufficient documentation

## 2023-06-01 DIAGNOSIS — R070 Pain in throat: Secondary | ICD-10-CM | POA: Insufficient documentation

## 2023-06-01 DIAGNOSIS — G9389 Other specified disorders of brain: Secondary | ICD-10-CM | POA: Diagnosis not present

## 2023-06-01 DIAGNOSIS — Z79811 Long term (current) use of aromatase inhibitors: Secondary | ICD-10-CM | POA: Diagnosis not present

## 2023-06-01 DIAGNOSIS — D696 Thrombocytopenia, unspecified: Secondary | ICD-10-CM | POA: Diagnosis not present

## 2023-06-01 DIAGNOSIS — R911 Solitary pulmonary nodule: Secondary | ICD-10-CM | POA: Diagnosis not present

## 2023-06-01 DIAGNOSIS — Z8701 Personal history of pneumonia (recurrent): Secondary | ICD-10-CM | POA: Insufficient documentation

## 2023-06-01 DIAGNOSIS — Z833 Family history of diabetes mellitus: Secondary | ICD-10-CM | POA: Insufficient documentation

## 2023-06-01 DIAGNOSIS — Z923 Personal history of irradiation: Secondary | ICD-10-CM | POA: Diagnosis not present

## 2023-06-01 DIAGNOSIS — Z79890 Hormone replacement therapy (postmenopausal): Secondary | ICD-10-CM | POA: Insufficient documentation

## 2023-06-01 DIAGNOSIS — Z800 Family history of malignant neoplasm of digestive organs: Secondary | ICD-10-CM | POA: Insufficient documentation

## 2023-06-01 DIAGNOSIS — Z9221 Personal history of antineoplastic chemotherapy: Secondary | ICD-10-CM | POA: Insufficient documentation

## 2023-06-01 DIAGNOSIS — Z9049 Acquired absence of other specified parts of digestive tract: Secondary | ICD-10-CM | POA: Insufficient documentation

## 2023-06-01 DIAGNOSIS — E039 Hypothyroidism, unspecified: Secondary | ICD-10-CM | POA: Insufficient documentation

## 2023-06-01 DIAGNOSIS — Z801 Family history of malignant neoplasm of trachea, bronchus and lung: Secondary | ICD-10-CM | POA: Insufficient documentation

## 2023-06-01 DIAGNOSIS — E041 Nontoxic single thyroid nodule: Secondary | ICD-10-CM | POA: Insufficient documentation

## 2023-06-01 DIAGNOSIS — Z8719 Personal history of other diseases of the digestive system: Secondary | ICD-10-CM | POA: Diagnosis not present

## 2023-06-01 DIAGNOSIS — Z5112 Encounter for antineoplastic immunotherapy: Secondary | ICD-10-CM | POA: Diagnosis present

## 2023-06-01 DIAGNOSIS — Z9012 Acquired absence of left breast and nipple: Secondary | ICD-10-CM | POA: Insufficient documentation

## 2023-06-01 DIAGNOSIS — Z8249 Family history of ischemic heart disease and other diseases of the circulatory system: Secondary | ICD-10-CM | POA: Insufficient documentation

## 2023-06-01 DIAGNOSIS — Z808 Family history of malignant neoplasm of other organs or systems: Secondary | ICD-10-CM | POA: Insufficient documentation

## 2023-06-01 DIAGNOSIS — Z8041 Family history of malignant neoplasm of ovary: Secondary | ICD-10-CM | POA: Insufficient documentation

## 2023-06-01 NOTE — Progress Notes (Signed)
[facility] [address] [place] [address]    Clinic Day:  06/01/2023  Referring physician: Benita Stabile, MD  Patient Care Team: Benita Stabile, MD as PCP - General (Internal Medicine) Laqueta Linden, MD (Inactive) as PCP - Cardiology (Cardiology) Harriette Bouillon, MD as Consulting Physician (General Surgery) Lurline Hare, MD as Consulting Physician (Radiation Oncology) Salomon Fick, NP as Nurse Practitioner (Hematology and Oncology) West Bali, MD (Inactive) as Consulting Physician (Gastroenterology) Doreatha Massed, MD as Medical Oncologist (Medical Oncology) Malmfelt, Lise Auer, RN as Oncology Nurse Navigator Christia Reading, MD as Consulting Physician (Otolaryngology) Lonie Peak, MD as Consulting Physician (Radiation Oncology) [name], MD as Consulting Physician (Hematology and Oncology) Therese Sarah, RN as Oncology Nurse Navigator (Medical Oncology)   ASSESSMENT & PLAN:   Assessment: 1. Stage III squamous cell cancer: - LLL lung mass FNA (08/23/2021): Malignant cells consistent with squamous cell carcinoma. - Lymph node 4R and 7: Malignant cells consistent with NSCLC. - Chemoradiation therapy with weekly carboplatin and paclitaxel from 11/01/2021, week for chemotherapy on 11/23/2021.  XRT completed on 12/16/2021. - She missed further doses of chemotherapy secondary to thrombocytopenia. - PET scan (01/27/2022): Interval response to therapy with decreasing size of mediastinal lymph nodes.  No new disease. - Consolidation durvalumab from 02/01/2022 through 02/14/2023   2. Social/family history: - Seen today with her Sister [name].  Patient lives by herself.  Quit smoking on 02/22/2021.  Smoked 2 packs/day for 40 years. - Mother died of ovarian cancer.  Brother died of colon cancer at age 90.  Sister had melanoma.  Paternal uncles had stomach cancer and lung cancer.  Paternal aunt had thyroid cancer.  Another paternal aunt  had pancreatic cancer.   3.  Supraglottic squamous cell carcinoma (T1N1): - Definitive radiation from 08/16/2021 through 09/13/2021    Plan: Recurrent squamous cell lung cancer: - We reviewed PET scan from 05/18/2023: Right supraclavicular lymph node, mediastinal and left hilar lymph nodes, right middle lobe subpleural nodule.  No evidence of metastatic disease in the abdomen or pelvis. - MRI brain: Did not show any metastatic disease. - Right supraclavicular lymph node biopsy (05/25/2023): Metastatic squamous cell carcinoma. - She was questioning why we have stopped Durvalumab in July.  I have reassured her that consolidation durvalumab was to be given for a year. - We talked about active treatments.  We also talked about best supportive care in the form of hospice. - She want to pursue active therapy at this time.  I have recommended checking Guardant360 and NGS results on the biopsy. - If she does not have any targetable mutations, we discussed chemotherapy option.  As she has progressed within 6 months of stopping her prior immunotherapy, I would not recommend any regimen containing immunotherapy.  Options include combination chemotherapy with carboplatin and Taxol.  Single agent chemotherapy with docetaxel and Cyramza is also an option.  Due to less side effects, we are leaning towards the second option.  If her NGS shows HER2 positivity, she may be a candidate for Enhertu. - She will come back in 4 weeks for follow-up to discuss results and plan.  2.  Supraglottic squamous cell carcinoma (T1N1): - PET scan on 05/18/2023 did not show any local recurrence.  3.  Throat pain and sternal chest pain: - Continue hydrocodone liquid 15 mL every 6 hours as needed.  4.  Hypokalemia: - Continue potassium 20 mEq daily.  5.  Hypothyroidism: - Continue Synthroid 100 mcg  daily.    No orders of the defined types were placed in this encounter.     Alben Deeds Teague,acting as a Neurosurgeon for  Doreatha Massed, MD.,have documented all relevant documentation on the behalf of Doreatha Massed, MD,as directed by  Doreatha Massed, MD while in the presence of Doreatha Massed, MD.  I, Doreatha Massed MD, have reviewed the above documentation for accuracy and completeness, and I agree with the above.    Doreatha Massed, MD   11/7/20246:29 PM  CHIEF COMPLAINT:   Diagnosis: stage III squamous cell lung cancer    Cancer Staging  Carcinoma of upper-outer quadrant of left female breast [facility]) Staging form: Breast, AJCC 7th Edition - Clinical stage from 07/01/2014: Stage IIA (T2, N0, M0) - Unsigned - Pathologic stage from 08/20/2014: Stage IIB (T2, N1a, cM0) - Unsigned  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HCC) Staging form: Larynx - Supraglottis, AJCC 8th Edition - Clinical stage from 07/30/2021: cT1, cN1 - Unsigned  Squamous cell lung cancer (HCC) Staging form: Lung, AJCC 8th Edition - Clinical stage from 09/08/2021: Stage IIIB (cT1b, cN3, cM0) - Signed by Lonie Peak, MD on 09/08/2021    Prior Therapy: Chemoradiation therapy with weekly carboplatin and paclitaxel from 11/01/2021, week for chemotherapy on 11/23/2021.  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    06/24/2014 Breast US   Left breast: ill-defined predominantly hypoechoic area with some increased echogenicity in the 2 o'clock position of the left breast, 7 cm from the nipple. 2.6 x 2.3 x 1.8 cm in maximum dimensions.   07/01/2014 Initial Biopsy   Left breast core needle bx: Invasive mammary carcinoma with lobular features, ER+ (100%), PR+ (91%),  HER2/neu negative (ratio 1.09), Ki-67 32%. E-cadherin strongly positive, diagnostic for IDC    07/01/2014 Clinical Stage   Stage IIA: T2 N0   08/20/2014 Definitive Surgery   Left breast seed localized lumpectomy/SLNB: IDC, +LVI, DCIS, 1 LN removed and positive for metastatic carcinoma. Grade 2. HER2/neu repeated and negative (ratio 0.69-1.9).    08/20/2014 Pathologic Stage   Stage IIB: pT2 pN1a pMx   09/17/2014 Surgery   Port-a cath placement and re-excision   10/02/2014 - 01/15/2015 Chemotherapy   CMF x 6 cycles.  patient refused Taxane-containing chemotherapy.   03/07/2015 Procedure   Comp Cancer Panel reveals no variant at APC, ATM, AXIN2, BARD1, BMPR1A, BRCA1, BRCA2, BRIP1, CDH1, CDK4, CDKN2A, CHEK2, FANCC, MLH1, MSH2, MSH6, MUTYH, NBN, PALB2, PMS2, POLD1, POLE, PTEN, RAD51C, RAD51D, SMAD4, STK11, TP53, VHL, and XRCC2.    04/06/2015 - 05/21/2015 Radiation Therapy   Adjuvant RT Michell Heinrich): Left breast/ 45 Gy over 25 fractions.   Left supraclavicular fossa and axilla/ 45 Gy over 25 fractions. Left breast boost/ 16 Gy over 8 fractions. Total dose: 61 Gy   06/04/2015 - 08/07/2015 Anti-estrogen oral therapy   Exemestane 25 mg daily.  Planned duration of therapy 5 years.    07/23/2015 Survivorship   Survivorship care plan completed and mailed to patient in lieu of in person visit   08/06/2015 Adverse Reaction   Severe hot flashes and mood swings   08/08/2015 - 12/07/2015 Anti-estrogen oral therapy  Arimidex   12/04/2015 Adverse Reaction   Worsening depression, patient stopped Arimidex   12/07/2015 -  Anti-estrogen oral therapy   Tamoxifen   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HCC)  07/30/2021 Initial Diagnosis   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HCC) P 16 positive cT1N1   08/16/2021 - 09/13/2021 Radiation Therapy   Completed definitive radiation   09/30/2021 Pathology Results   Guardant 360 Pathology PDL 1 29%   Squamous cell lung cancer (HCC)  07/23/2021 Imaging    PET/CT   demonstrated small right glottic lesion is mildly hypermetabolic and consistent with no neoplasm.  PET also demonstrated borderline right level 2 lymph node left supraclavicular, mediastinal and hilar lymphadenopathy and hypermetabolic left lower lobe pulmonary nodule all suspicious of metastatic focus.  No abdominal pelvic metastatic disease or osseous metastatic disease was appreciated.   08/31/2021 Initial Diagnosis   Squamous cell carcinoma of lung (HCC)   09/08/2021 Cancer Staging   Staging form: Lung, AJCC 8th Edition - Clinical stage from 09/08/2021: Stage IIIB (cT1b, cN3, cM0) - Signed by Lonie Peak, MD on 09/08/2021 Stage prefix: Initial diagnosis   09/30/2021 Pathology Results   Guardant 360 Pathology PDL 1 29%   11/01/2021 - 11/23/2021 Chemotherapy   Patient is on Treatment Plan : LUNG Carboplatin / Paclitaxel + XRT q7d     02/01/2022 - 03/15/2022 Chemotherapy   Patient is on Treatment Plan : LUNG Durvalumab q14d     02/01/2022 -  Chemotherapy   Patient is on Treatment Plan : LUNG Durvalumab (10) q14d        INTERVAL HISTORY:   Zahra is a 72 y.o. female presenting to clinic today for follow up of stage III squamous cell lung cancer. She was last seen by me on 05/08/23.  Since her last visit, she underwent restaging PET on 05/18/23 that found: recently demonstrated enlarged right supraclavicular lymph node is markedly hypermetabolic, consistent with metastatic disease; multiple hypermetabolic mediastinal and left hilar lymph nodes, consistent with metastatic disease; previously described subpleural nodule in the right middle lobe is hypermetabolic, suspicious for metastatic disease; no evidence of metastatic disease in the abdomen or pelvis; stable treatment changes in the left infrahilar region without evidence of local recurrence; and stable mixed lucent and sclerotic lesion within the T5 vertebral body, without hypermetabolic activity, appearing to represent a hemangioma and  associated fracture based on previous CT's. She had a brain MRI on 05/19/23 that found: no metastatic disease or acute intracranial abnormality and stable moderately advanced cerebral white matter changes most commonly due to small vessel disease. Right supraclavicular lymph node biopsy from 05/25/23 revealed: metastatic squamous cell carcinoma.   She was admitted to the hospital on 05/28/23 for acute hypoxemic respiratory failure secondary to COPD exacerbation. She was given prednisone 40 mg OD for 5 days and azithromycin 250 mg OD for three days upon discharge on 11/5.   Today, she states that she is doing well overall. Her appetite level is at 100%. Her energy level is at 70%. She is accompanied by her sister.   Prior to hospitalization, she states she could not catch her breath, even after using her inhaler. She was reportedly told during her hospitalization that she should stay on 2L of O2, at least temporarily.   PAST MEDICAL HISTORY:   Past Medical History: Past Medical History:  Diagnosis Date   Arthritis    Asthma    Asymptomatic varicose veins    Breast cancer (HCC) 06/2014   left   Cancer (HCC)  Supra Glottis   Chronic airway obstruction (HCC)    Complication of anesthesia 2016   difficulty waking up, Oxygen dropped low.   COPD (chronic obstructive pulmonary disease) (HCC)    Depression    Dyspnea    GERD (gastroesophageal reflux disease)    Hemorrhage rectum    and anus.   Hypertension    Insomnia    Other symptoms involving digestive system(787.99)    Pain    Hx.pain in joint involving pelvic region and thigh; pain in limb   Palpitations    Panic attacks    Personal history of chemotherapy 2016   Personal history of radiation therapy 2016   Pneumonia    Pre-diabetes    Prediabetes    Sinusitis    Symptomatic menopausal or female climacteric states    Varicose veins of both lower extremities with pain     Surgical History: Past Surgical History:  Procedure  Laterality Date   BREAST BIOPSY Left 10/2012   BREAST BIOPSY Left 07/01/2014   malignant   BREAST LUMPECTOMY Left 08/20/2014   BRONCHIAL BIOPSY  08/23/2021   Procedure: BRONCHIAL BIOPSIES;  Surgeon: Leslye Peer, MD;  Location: [facility];  Service: Pulmonary;;   BRONCHIAL BRUSHINGS  08/23/2021   Procedure: BRONCHIAL BRUSHINGS;  Surgeon: Leslye Peer, MD;  Location[facility];  Service: Pulmonary;;   BRONCHIAL NEEDLE ASPIRATION BIOPSY  08/23/2021   Procedure: BRONCHIAL NEEDLE ASPIRATION BIOPSIES;  Surgeon: Leslye Peer, MD;  Location: [facility];  Service: Pulmonary;;   BRONCHIAL WASHINGS  08/23/2021   Procedure: BRONCHIAL WASHINGS;  Surgeon: Leslye Peer, MD;  Location: [facility];  Service: Pulmonary;;   CESAREAN SECTION     CHOLECYSTECTOMY  1985   COLONOSCOPY  2002   Dr. Cleotis Nipper: internal hemorrhoids, one small rectal polyp, adenomatous path    COLONOSCOPY N/A 11/23/2015   Procedure: COLONOSCOPY;  Surgeon: West Bali, MD;  Location: [facility];  Service: Endoscopy;  Laterality: N/A;  1100 - moved to 10:45 - office to notify   ESOPHAGOGASTRODUODENOSCOPY N/A 11/23/2015   Procedure: ESOPHAGOGASTRODUODENOSCOPY (EGD);  Surgeon: West Bali, MD;  Location: [facility];  Service: Endoscopy;  Laterality: N/A;   IR IMAGING GUIDED PORT INSERTION  11/08/2021   PORT-A-CATH REMOVAL  02/2015   PORTACATH PLACEMENT Right 09/17/2014   Procedure: INSERTION PORT-A-CATH WITH ULTRASOUND;  Surgeon: Harriette Bouillon, MD;  Location: [facility];  Service: General;  Laterality: Right;  IJ   RADIOACTIVE SEED GUIDED PARTIAL MASTECTOMY WITH AXILLARY SENTINEL LYMPH NODE BIOPSY Left 08/20/2014   Procedure: LEFT BREAST LUMPECTOMY WITH RADIOACTIVE SEED LOCALIZATION AND SENTINEL LYMPH NODE MAPPING;  Surgeon: Harriette Bouillon, MD;  Location: [facility];  Service: General;  Laterality: Left;   RE-EXCISION OF BREAST LUMPECTOMY Left 09/17/2014   Procedure: RE-EXCISION OF  BREAST LUMPECTOMY;  Surgeon: Harriette Bouillon, MD;  Location: [facility];  Service: General;  Laterality: Left;   TOTAL HIP ARTHROPLASTY Right 02/22/2021   Procedure: RIGHT TOTAL HIP ARTHROPLASTY ANTERIOR APPROACH;  Surgeon: Eldred Manges, MD;  Location: [facility];  Service: Orthopedics;  Laterality: Right;   TUBAL LIGATION     VIDEO BRONCHOSCOPY WITH ENDOBRONCHIAL ULTRASOUND N/A 08/23/2021   Procedure: VIDEO BRONCHOSCOPY WITH ENDOBRONCHIAL ULTRASOUND;  Surgeon: Leslye Peer, MD;  Location: [facility];  Service: Pulmonary;  Laterality: N/A;   VIDEO BRONCHOSCOPY WITH RADIAL ENDOBRONCHIAL ULTRASOUND  08/23/2021   Procedure: VIDEO BRONCHOSCOPY WITH RADIAL ENDOBRONCHIAL ULTRASOUND;  Surgeon: Leslye Peer, MD;  Location:  [facility];  Service: Pulmonary;;    Social History: Social History   Socioeconomic History   Marital status: Divorced    Spouse name: Not on file   Number of children: 2   Years of education: Not on file   Highest education level: Not on file  Occupational History   Not on file  Tobacco Use   Smoking status: Former    Current packs/day: 0.00    Average packs/day: 1 pack/day for 42.0 years (42.0 ttl pk-yrs)    Types: Cigarettes    Start date: 02/23/1979    Quit date: 02/22/2021    Years since quitting: 2.2   Smokeless tobacco: Never  Vaping Use   Vaping status: Never Used  Substance and Sexual Activity   Alcohol use: No    Alcohol/week: 0.0 standard drinks of alcohol   Drug use: No   Sexual activity: Not Currently    Birth control/protection: None  Other Topics Concern   Not on file  Social History Narrative   Not on file   Social Determinants of Health   Financial Resource Strain: Not on file  Food Insecurity: No Food Insecurity (05/29/2023)   Hunger Vital Sign    Worried About Running Out of Food in the Last Year: Never true    Ran Out of Food in the Last Year: Never true  Transportation Needs: No Transportation Needs (05/29/2023)   PRAPARE -  Administrator, Civil Service (Medical): No    Lack of Transportation (Non-Medical): No  Physical Activity: Not on file  Stress: Not on file  Social Connections: Unknown (08/05/2021)   Social Connection and Isolation Panel [NHANES]    Frequency of Communication with Friends and Family: More than three times a week    Frequency of Social Gatherings with Friends and Family: More than three times a week    Attends Religious Services: Not on file    Active Member of Clubs or Organizations: Not on file    Attends Banker Meetings: Not on file    Marital Status: Not on file  Intimate Partner Violence: Not At Risk (05/29/2023)   Humiliation, Afraid, Rape, and Kick questionnaire    Fear of Current or Ex-Partner: No    Emotionally Abused: No    Physically Abused: No    Sexually Abused: No    Family History: Family History  Problem Relation Age of Onset   Diabetes Mother    Ovarian cancer Mother 106   Other Father        died in MVA at age 63   Other Sister        mitral valve disorder   Melanoma Sister 54       removed from back of leg   Colon cancer Brother        early 67s, succumbed to disease   Cancer Paternal Aunt        leukemia, bone, or lung cancer   Alzheimer's disease Maternal Grandmother    Heart attack Maternal Grandfather    Heart attack Paternal Grandmother    COPD Paternal Grandfather    Emphysema Paternal Grandfather    Congestive Heart Failure Paternal Aunt    Stomach cancer Paternal Uncle    Kidney cancer Maternal Uncle    Cancer Cousin        dx. teens/dx. 40s   Cancer Cousin    Colon cancer Cousin     Current Medications:  Current Outpatient Medications:    albuterol (VENTOLIN  HFA) 108 (90 Base) MCG/ACT inhaler, Inhale 2 puffs into the lungs every 4 (four) hours as needed for shortness of breath., Disp: , Rfl:    ALPRAZolam (XANAX) 0.5 MG tablet, Take 0.5 mg by mouth 3 (three) times daily., Disp: , Rfl:    ANORO ELLIPTA 62.5-25  MCG/INH AEPB, Inhale 1 puff into the lungs daily., Disp: , Rfl:    azithromycin (ZITHROMAX) 250 MG tablet, Take 1 tablet (250 mg total) by mouth daily for 3 days., Disp: 3 tablet, Rfl: 0   calcium carbonate (TUMS - DOSED IN MG ELEMENTAL CALCIUM) 500 MG chewable tablet, Chew 2 tablets by mouth 3 (three) times daily as needed for indigestion or heartburn., Disp: , Rfl:    furosemide (LASIX) 20 MG tablet, Take 20 mg by mouth daily., Disp: , Rfl:    hydrochlorothiazide (HYDRODIURIL) 12.5 MG tablet, Take 12.5 mg by mouth daily., Disp: , Rfl:    HYDROcodone-acetaminophen (HYCET) 7.5-325 mg/15 ml solution, Take 15 mLs by mouth every 6 (six) hours as needed for moderate pain (pain score 4-6)., Disp: 473 mL, Rfl: 0   ibuprofen (ADVIL) 100 MG/5ML suspension, Take 10 mLs (200 mg total) by mouth every 4 (four) hours as needed for moderate pain., Disp: 473 mL, Rfl: 0   levothyroxine (SYNTHROID) 100 MCG tablet, TAKE 1 TABLET BY MOUTH ONCE DAILY BEFORE BREAKFAST, Disp: 90 tablet, Rfl: 0   lidocaine (XYLOCAINE) 2 % solution, Patient: Mix 1part 2% viscous lidocaine, 1part H20. Swallow 10mL of diluted mixture, before meals and at bedtime, up to QID for sore throat., Disp: 200 mL, Rfl: 3   lidocaine-prilocaine (EMLA) cream, APPLY CREAM TOPICALLY AS NEEDED, Disp: 30 g, Rfl: 3   Menthol, Topical Analgesic, (BIOFREEZE EX), Apply 1 application topically daily as needed (pain)., Disp: , Rfl:    pantoprazole (PROTONIX) 40 MG tablet, Take 1 tablet by mouth once daily, Disp: 90 tablet, Rfl: 0   polyethylene glycol (MIRALAX / GLYCOLAX) 17 g packet, Take 17 g by mouth daily as needed for mild constipation., Disp: , Rfl:    predniSONE (DELTASONE) 10 MG tablet, Take 4 tablets (40 mg total) by mouth daily for 5 days., Disp: 20 tablet, Rfl: 0   sertraline (ZOLOFT) 100 MG tablet, Take 100 mg by mouth daily., Disp: , Rfl:    Allergies: No Known Allergies  REVIEW OF SYSTEMS:   Review of Systems  Constitutional:  Negative  for chills, fatigue and fever.  HENT:   Negative for lump/mass, mouth sores, nosebleeds, sore throat and trouble swallowing.   Eyes:  Negative for eye problems.  Respiratory:  Positive for cough and shortness of breath.   Cardiovascular:  Positive for chest pain and palpitations. Negative for leg swelling.  Gastrointestinal:  Positive for diarrhea and nausea. Negative for abdominal pain, constipation and vomiting.  Genitourinary:  Negative for bladder incontinence, difficulty urinating, dysuria, frequency, hematuria and nocturia.   Musculoskeletal:  Negative for arthralgias, back pain, flank pain, myalgias and neck pain.  Skin:  Negative for itching and rash.  Neurological:  Positive for dizziness and headaches. Negative for numbness.  Hematological:  Does not bruise/bleed easily.  Psychiatric/Behavioral:  Negative for depression, sleep disturbance and suicidal ideas. The patient is not nervous/anxious.   All other systems reviewed and are negative.    VITALS:   Blood pressure (!) 143/80, pulse 84, temperature 97.9 F (36.6 C), temperature source Oral, resp. rate 16, weight 205 lb 12.8 oz (93.4 kg), SpO2 95%.  Wt Readings from Last 3 Encounters:  06/01/23 205 lb 12.8 oz (93.4 kg)  05/30/23 205 lb 7.5 oz (93.2 kg)  05/25/23 200 lb (90.7 kg)    Body mass index is 36.46 kg/m.  Performance status (ECOG): 1 - Symptomatic but completely ambulatory  PHYSICAL EXAM:   Physical Exam Vitals and nursing note reviewed. Exam conducted with a chaperone present.  Constitutional:      Appearance: Normal appearance.  Cardiovascular:     Rate and Rhythm: Normal rate and regular rhythm.     Pulses: Normal pulses.     Heart sounds: Normal heart sounds.  Pulmonary:     Effort: Pulmonary effort is normal.     Breath sounds: Normal breath sounds.  Abdominal:     Palpations: Abdomen is soft. There is no hepatomegaly, splenomegaly or mass.     Tenderness: There is no abdominal tenderness.   Musculoskeletal:     Right lower leg: No edema.     Left lower leg: No edema.  Lymphadenopathy:     Cervical: No cervical adenopathy.     Right cervical: No superficial, deep or posterior cervical adenopathy.    Left cervical: No superficial, deep or posterior cervical adenopathy.     Upper Body:     Right upper body: No supraclavicular or axillary adenopathy.     Left upper body: No supraclavicular or axillary adenopathy.  Neurological:     General: No focal deficit present.     Mental Status: She is alert and oriented to person, place, and time.  Psychiatric:        Mood and Affect: Mood normal.        Behavior: Behavior normal.     LABS:      Latest Ref Rng & Units 05/30/2023    4:08 AM 05/29/2023   12:32 AM 05/01/2023   11:45 AM  CBC  WBC 4.0 - 10.5 K/uL 12.8  11.8  6.2   Hemoglobin 12.0 - 15.0 g/dL 10.2  72.5  36.6   Hematocrit 36.0 - 46.0 % 42.0  43.8  44.3   Platelets 150 - 400 K/uL 203  183  223       Latest Ref Rng & Units 05/30/2023    4:08 AM 05/29/2023   12:32 AM 05/01/2023   11:45 AM  CMP  Glucose 70 - 99 mg/dL 440  347  425   BUN 8 - 23 mg/dL 25  15  17    Creatinine 0.44 - 1.00 mg/dL 9.56  3.87  5.64   Sodium 135 - 145 mmol/L 135  136  135   Potassium 3.5 - 5.1 mmol/L 3.7  3.9  3.8   Chloride 98 - 111 mmol/L 101  101  101   CO2 22 - 32 mmol/L 25  27  25    Calcium 8.9 - 10.3 mg/dL 9.2  9.4  9.1   Total Protein 6.5 - 8.1 g/dL 6.9  7.4  7.4   Total Bilirubin <1.2 mg/dL 1.8  2.5  2.1   Alkaline Phos 38 - 126 U/L 61  67  67   AST 15 - 41 U/L 14  17  15    ALT 0 - 44 U/L 12  13  13       Lab Results  Component Value Date   CEA1 4.53 11/01/2021   /  CEA ([facility])  Date Value Ref Range Status  11/01/2021 4.53 0.00 - 5.00 ng/mL Final    Comment:    (NOTE) This test was performed using Architect's Chemiluminescent Microparticle  Immunoassay. Values obtained from different assay methods cannot be used interchangeably. Please note that 5-10%  of patients who smoke may see CEA levels up to 6.9 ng/mL. Performed at [facility], [address], [place], [place] [address]    No results found for: "PSA1" No results found for: "CAN199" Lab Results  Component Value Date   CAN125 18.9 08/10/2021    No results found for: "TOTALPROTELP", "ALBUMINELP", "A1GS", "A2GS", "BETS", "BETA2SER", "GAMS", "MSPIKE", "SPEI" No results found for: "TIBC", "FERRITIN", "IRONPCTSAT" No results found for: "LDH"   STUDIES:   NM PET Image Restag (PS) Skull Base To Thigh  Result Date: 06/01/2023 CLINICAL DATA:  Subsequent treatment strategy for non-small cell lung cancer. Supraglottic carcinoma. Progressive mediastinal and right supraclavicular adenopathy on CT. EXAM: NUCLEAR MEDICINE PET SKULL BASE TO THIGH TECHNIQUE: 11.26 mCi F-18 FDG was injected intravenously. Full-ring PET imaging was performed from the skull base to thigh after the radiotracer. CT data was obtained and used for attenuation correction and anatomic localization. Fasting blood glucose: 104 mg/dl COMPARISON:  PET-CT 56/21/3086. CT of the neck and chest 05/01/2023. FINDINGS: Mediastinal blood pool activity: SUV max 2.5 NECK: The recently demonstrated enlarged right supraclavicular node lymph node is markedly hypermetabolic with an SUV max of 21.6. This lymph node measures approximately 3.3 x 2.0 cm on image 54/202 and was biopsied on 05/25/2023. No other hypermetabolic cervical lymph nodes are identified. No suspicious activity identified within the pharyngeal mucosal space. Incidental CT findings: There is a stable 3.2 cm right thyroid nodule on image 56/202. This demonstrates no hypermetabolic activity and has been previously biopsied on 08/04/2021. CHEST: There are multiple hypermetabolic mediastinal and left hilar lymph nodes. High left paratracheal node measuring approximately 1.3 cm on image 59/202 has an SUV max of 16.8. Clustered lymph nodes in the left  prevascular space measuring 2.6 x 1.2 cm on image 63/202 have an SUV max of 10.9. Small left hilar lymph nodes have an SUV max of 5.5. No hypermetabolic axillary lymph nodes. The previously described subpleural nodule in the right middle lobe has not significantly changed in size from the recent CTs, measuring 1.8 cm on image 33/203. However, this nodule show significant hypermetabolic activity (SUV max 8.4), suspicious for metastatic disease. No other hypermetabolic pulmonary activity or suspicious nodularity. No evidence of recurrence in the left infrahilar region. Incidental CT findings: Right IJ Port-A-Cath extends to the superior cavoatrial junction. Atherosclerosis of the aorta, great vessels and coronary arteries. Centrilobular emphysema with stable treatment changes in the left infrahilar region. ABDOMEN/PELVIS: There is no hypermetabolic activity within the liver, adrenal glands, spleen or pancreas. There is no hypermetabolic nodal activity in the abdomen or pelvis. Incidental CT findings: Status post cholecystectomy. Aortic and branch vessel atherosclerosis. SKELETON: There is no hypermetabolic activity to suggest osseous metastatic disease. Incidental CT findings: Mixed lucent and sclerotic lesion within the T5 vertebral body is stable, without hypermetabolic activity. Prominent discogenic changes in the lower thoracic spine. Decreased metabolic activity in the lower thoracic spine, likely from prior radiation therapy. IMPRESSION: 1. The recently demonstrated enlarged right supraclavicular lymph node is markedly hypermetabolic, consistent with metastatic disease. This lesion was biopsied on 05/25/2023, revealing metastatic squamous cell carcinoma. 2. Multiple hypermetabolic mediastinal and left hilar lymph nodes, consistent with metastatic disease. 3. The previously described subpleural nodule in the right middle lobe is hypermetabolic, suspicious for metastatic disease. 4. No evidence of metastatic  disease in the abdomen or pelvis. 5. Stable treatment changes in the left infrahilar region  without evidence of local recurrence. 6. 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 7. Aortic Atherosclerosis (ICD10-I70.0) and Emphysema (ICD10-J43.9). Electronically Signed   By: Carey Bullocks M.D.   On: 06/01/2023 08:57   MR Brain W Wo Contrast  Result Date: 06/01/2023 CLINICAL DATA:  72 year old female with dizziness. Lung cancer, head and neck cancer. Staging. EXAM: MRI HEAD WITHOUT AND WITH CONTRAST TECHNIQUE: Multiplanar, multiecho pulse sequences of the brain and surrounding structures were obtained without and with intravenous contrast. CONTRAST:  9mL GADAVIST GADOBUTROL 1 MMOL/ML IV SOLN COMPARISON:  Brain MRI 02/02/2022 without and with contrast. FINDINGS: Brain: No abnormal enhancement identified. No midline shift, mass effect, or evidence of intracranial mass lesion. No dural thickening. No restricted diffusion to suggest acute infarction. No patchy chronic cerebral white matter T2 and FLAIR hyperintensity in both hemispheres is stable. No cortical encephalomalacia identified. No chronic cerebral blood products identified. Deep gray nuclei, brainstem and cerebellum remain within normal limits. Ventriculomegaly, extra-axial collection or acute intracranial hemorrhage. Cervicomedullary junction and pituitary are within normal limits. Vascular: Major intracranial vascular flow voids are stable. Dominant distal left vertebral artery. Following contrast major dural venous sinuses are enhancing and appear to be patent. Skull and upper cervical spine: Visible bone marrow signal is stable. Small 12 mm indeterminate bone lesion seen in February of last year is subtle now on series 9, image 19 and most likely benign. Elsewhere marrow signal is normal. Negative visible  cervical spine and spinal cord. Sinuses/Orbits: Stable and negative. Other: Mastoids are clear. Visible internal auditory structures appear normal. Negative visible scalp and face. IMPRESSION: 1. Stable since 02/02/2022. No metastatic disease or acute intracranial abnormality. 2. Stable moderately advanced cerebral white matter changes most commonly due to small vessel disease. Electronically Signed   By: Odessa Fleming M.D.   On: 06/01/2023 08:34   ECHOCARDIOGRAM COMPLETE  Result Date: 05/29/2023    ECHOCARDIOGRAM REPORT   Patient Name:   SHILOH SOUTHERN Date of Exam: 05/29/2023 Medical Rec #:  [id number]       Height:       63.0 in Accession #:    [id number]      Weight:       207.0 lb Date of Birth:  [birth year]        BSA:          1.962 m Patient Age:    72 years        BP:           137/88 mmHg Patient Gender: F               HR:           90 bpm. [facility] Procedure: 2D Echo, Cardiac Doppler, Color Doppler and Intracardiac            Opacification Agent Indications:    Dyspnea R06.00  History:        Patient has prior history of Echocardiogram examinations, most                 recent 12/04/2019. COPD; Risk Factors:Hypertension. Squamous cell                 lung cancer (HCC), Hx of smoking,                 Prediabetes (From Hx).  Sonographer:    Celesta Gentile  RCS Referring Phys: [id number] OLADAPO ADEFESO IMPRESSIONS  1. Left ventricular ejection fraction, by estimation, is 70 to 75%. The left ventricle has hyperdynamic function. The left ventricle has no regional wall motion abnormalities. There is mild left ventricular hypertrophy. Left ventricular diastolic parameters are consistent with Grade I diastolic dysfunction (impaired relaxation).  2. Right ventricular systolic function is normal. The right ventricular size is normal. Tricuspid regurgitation signal is inadequate for assessing PA pressure.  3. The mitral valve is normal in structure. No evidence of mitral valve regurgitation. No evidence of  mitral stenosis.  4. The aortic valve was not well visualized. Aortic valve regurgitation is not visualized. No aortic stenosis is present.  5. The inferior vena cava is normal in size with greater than 50% respiratory variability, suggesting right atrial pressure of 3 mmHg. FINDINGS  Left Ventricle: Left ventricular ejection fraction, by estimation, is 70 to 75%. The left ventricle has hyperdynamic function. The left ventricle has no regional wall motion abnormalities. Definity contrast agent was given IV to delineate the left ventricular endocardial borders. The left ventricular internal cavity size was normal in size. There is mild left ventricular hypertrophy. Left ventricular diastolic parameters are consistent with Grade I diastolic dysfunction (impaired relaxation). Normal left ventricular filling pressure. Right Ventricle: The right ventricular size is normal. Right vetricular wall thickness was not well visualized. Right ventricular systolic function is normal. Tricuspid regurgitation signal is inadequate for assessing PA pressure. Left Atrium: Left atrial size was normal in size. Right Atrium: Right atrial size was normal in size. Pericardium: Trivial pericardial effusion is present. The pericardial effusion is circumferential. Mitral Valve: The mitral valve is normal in structure. No evidence of mitral valve regurgitation. No evidence of mitral valve stenosis. Tricuspid Valve: The tricuspid valve is not well visualized. Tricuspid valve regurgitation is not demonstrated. No evidence of tricuspid stenosis. Aortic Valve: The aortic valve was not well visualized. Aortic valve regurgitation is not visualized. No aortic stenosis is present. Aortic valve mean gradient measures 4.6 mmHg. Aortic valve peak gradient measures 11.0 mmHg. Aortic valve area, by VTI measures 2.54 cm. Pulmonic Valve: The pulmonic valve was not well visualized. Pulmonic valve regurgitation is trivial. No evidence of pulmonic stenosis.  Aorta: The aortic root is normal in size and structure. Venous: The inferior vena cava is normal in size with greater than 50% respiratory variability, suggesting right atrial pressure of 3 mmHg. IAS/Shunts: No atrial level shunt detected by color flow Doppler.  LEFT VENTRICLE PLAX 2D LVIDd:         3.90 cm   Diastology LVIDs:         2.30 cm   LV e' medial:    7.72 cm/s LV PW:         1.10 cm   LV E/e' medial:  11.0 LV IVS:        1.10 cm   LV e' lateral:   8.92 cm/s LVOT diam:     2.00 cm   LV E/e' lateral: 9.5 LV SV:         67 LV SV Index:   34 LVOT Area:     3.14 cm  RIGHT VENTRICLE RV S prime:     16.20 cm/s TAPSE (M-mode): 2.0 cm LEFT ATRIUM             Index        RIGHT ATRIUM           Index LA diam:        3.50  cm 1.78 cm/m   RA Area:     13.80 cm LA Vol (A2C):   20.7 ml 10.55 ml/m  RA Volume:   30.70 ml  15.65 ml/m LA Vol (A4C):   39.6 ml 20.18 ml/m LA Biplane Vol: 32.1 ml 16.36 ml/m  AORTIC VALVE AV Area (Vmax):    2.12 cm AV Area (Vmean):   2.04 cm AV Area (VTI):     2.54 cm AV Vmax:           165.91 cm/s AV Vmean:          98.128 cm/s AV VTI:            0.263 m AV Peak Grad:      11.0 mmHg AV Mean Grad:      4.6 mmHg LVOT Vmax:         112.00 cm/s LVOT Vmean:        63.800 cm/s LVOT VTI:          0.212 m LVOT/AV VTI ratio: 0.81  AORTA Ao Root diam: 3.60 cm MITRAL VALVE MV Area (PHT): 1.89 cm     SHUNTS MV Decel Time: 401 msec     Systemic VTI:  0.21 m MV E velocity: 84.80 cm/s   Systemic Diam: 2.00 cm MV A velocity: 104.00 cm/s MV E/A ratio:  0.82 Dina Rich MD Electronically signed by Dina Rich MD Signature Date/Time: 05/29/2023/1:58:06 PM    Final    DG Chest Portable 1 View  Result Date: 05/29/2023 CLINICAL DATA:  Shortness of breath EXAM: PORTABLE CHEST 1 VIEW COMPARISON:  09/14/2022 FINDINGS: Right Port-A-Cath remains in place, unchanged. Heart and mediastinal contours are within normal limits. Linear densities at the right lung base, likely atelectasis. Left lung clear.  No effusions or acute bony abnormality. IMPRESSION: Right base atelectasis.  No active disease. Electronically Signed   By: Charlett Nose M.D.   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 Probable tumor recurrence in enlarged and hypermetabolic lymph nodes within the right supraclavicular region and superior mediastinum. EXAM: ULTRASOUND GUIDED CORE BIOPSY OF RIGHT SUPRACLAVICULAR LYMPH NODE MEDICATIONS: None. ANESTHESIA/SEDATION: Moderate (conscious) sedation was employed during this procedure. A total of Versed 1.5 mg and Fentanyl 50 mcg was administered intravenously by radiology nursing. Moderate Sedation Time: 11 minutes. The patient's level of consciousness and vital signs were monitored continuously by radiology nursing throughout the procedure under my direct supervision. PROCEDURE: The procedure, risks, benefits, and alternatives were explained to the patient. Questions regarding the procedure were encouraged and answered. The patient understands and consents to the procedure. A time-out was performed prior to initiating the procedure. Ultrasound was used to localize a right supraclavicular lymph node. The right supraclavicular region was prepped with exiting in a sterile fashion, and a sterile drape was applied covering the operative field. A sterile gown and sterile gloves were used for the procedure. Local anesthesia was provided with 1% Lidocaine. Under ultrasound guidance, an 18 gauge core biopsy device was utilized in obtaining 3 separate core biopsy samples through an enlarged right supraclavicular lymph node. Core biopsy samples were placed in formalin. Additional ultrasound was performed. COMPLICATIONS: None immediate. FINDINGS: Enlarged right supraclavicular lymph node measures approximately 3.3 x 1.8 x 2.2 cm by ultrasound. This corresponds to the enlarged lymph node seen by prior imaging and  hypermetabolic lymph node by prior PET scan. Solid core biopsy samples were obtained. IMPRESSION:  Ultrasound-guided core biopsy performed of an enlarged right supraclavicular lymph node measuring approximately 3.3 cm in greatest diameter. Electronically Signed   By: Irish Lack M.D.   On: 05/25/2023 14:47

## 2023-06-01 NOTE — Patient Instructions (Addendum)
[facility] at [facility] Discharge Instructions   You were seen and examined today by Dr. Ellin Saba.  He reviewed the results of your lab work which are normal/stable.   He reviewed the results of the biopsy which is showing a squamous cell cancer.   He reviewed the results of your PET scan. There are new areas of cancer in the chest in the lymph nodes. There are no areas of cancer elsewhere in the body.  We will send special testing on the biopsy to give [place] options for treatment of this recurrence of cancer. We will see you back in 4 weeks after those results are back.   Return as scheduled.    Thank you for choosing [facility] at [facility] to provide your oncology and hematology care.  To afford each patient quality time with our provider, please arrive at least 15 minutes before your scheduled appointment time.   If you have a lab appointment with the Cancer Center please come in thru the Main Entrance and check in at the main information desk.  You need to re-schedule your appointment should you arrive 10 or more minutes late.  We strive to give you quality time with our providers, and arriving late affects you and other patients whose appointments are after yours.  Also, if you no show three or more times for appointments you may be dismissed from the clinic at the providers discretion.     Again, thank you for choosing [facility].  Our hope is that these requests will decrease the amount of time that you wait before being seen by our physicians.       _____________________________________________________________  Should you have questions after your visit to [facility], please contact our office at [phone] and follow the prompts.  Our office hours are 8:00 a.m. and 4:30 p.m. Monday - Friday.  Please note that voicemails left after 4:00 p.m. may not be returned until the following business day.  We are  closed weekends and major holidays.  You do have access to a nurse 24-7, just call the main number to the clinic [phone] and do not press any options, hold on the line and a nurse will answer the phone.    For prescription refill requests, have your pharmacy contact our office and allow 72 hours.    Due to Covid, you will need to wear a mask upon entering the hospital. If you do not have a mask, a mask will be given to you at the Main Entrance upon arrival. For doctor visits, patients may have 1 support person age 88 or older with them. For treatment visits, patients can not have anyone with them due to social distancing guidelines and our immunocompromised population.

## 2023-06-02 ENCOUNTER — Other Ambulatory Visit: Payer: Self-pay

## 2023-06-09 DIAGNOSIS — C3490 Malignant neoplasm of unspecified part of unspecified bronchus or lung: Secondary | ICD-10-CM | POA: Diagnosis not present

## 2023-06-12 ENCOUNTER — Other Ambulatory Visit: Payer: Self-pay | Admitting: *Deleted

## 2023-06-12 MED ORDER — HYDROCODONE-ACETAMINOPHEN 7.5-325 MG/15ML PO SOLN: 15.0000 mL | Freq: Four times a day (QID) | ORAL | 0 refills | Status: DC | PRN

## 2023-06-14 DIAGNOSIS — C770 Secondary and unspecified malignant neoplasm of lymph nodes of head, face and neck: Secondary | ICD-10-CM | POA: Diagnosis not present

## 2023-06-14 DIAGNOSIS — C3432 Malignant neoplasm of lower lobe, left bronchus or lung: Secondary | ICD-10-CM | POA: Diagnosis not present

## 2023-06-19 ENCOUNTER — Encounter (HOSPITAL_COMMUNITY): Payer: Self-pay

## 2023-06-26 ENCOUNTER — Other Ambulatory Visit: Payer: Self-pay | Admitting: *Deleted

## 2023-06-26 MED ORDER — HYDROCODONE-ACETAMINOPHEN 7.5-325 MG/15ML PO SOLN: 15.0000 mL | Freq: Four times a day (QID) | ORAL | 0 refills | Status: DC | PRN

## 2023-06-27 NOTE — Progress Notes (Signed)
[facility] ([facility] [address], [place] [address]    Clinic Day:  06/29/2023  Referring physician: Benita Stabile, MD  Patient Care Team: Benita Stabile, MD as PCP - General (Internal Medicine) Laqueta Linden, MD (Inactive) as PCP - Cardiology (Cardiology) Harriette Bouillon, MD as Consulting Physician (General Surgery) Lurline Hare, MD as Consulting Physician (Radiation Oncology) Salomon Fick, NP as Nurse Practitioner (Hematology and Oncology) West Bali, MD (Inactive) as Consulting Physician (Gastroenterology) Doreatha Massed, MD as Medical Oncologist (Medical Oncology) Malmfelt, Lise Auer, RN as Oncology Nurse Navigator Christia Reading, MD as Consulting Physician (Otolaryngology) Lonie Peak, MD as Consulting Physician (Radiation Oncology) [name], MD as Consulting Physician (Hematology and Oncology) Therese Sarah, RN as Oncology Nurse Navigator (Medical Oncology)   ASSESSMENT & PLAN:   Assessment: 1. Stage III squamous cell cancer: - LLL lung mass FNA (08/23/2021): Malignant cells consistent with squamous cell carcinoma. - Lymph node 4R and 7: Malignant cells consistent with NSCLC. - Chemoradiation therapy with weekly carboplatin and paclitaxel from 11/01/2021, week for chemotherapy on 11/23/2021.  XRT completed on 12/16/2021. - She missed further doses of chemotherapy secondary to thrombocytopenia. - PET scan (01/27/2022): Interval response to therapy with decreasing size of mediastinal lymph nodes.  No new disease. - Consolidation durvalumab from 02/01/2022 through 02/14/2023 - PET scan (05/18/2023): Right supraclavicular lymph node, mediastinal and left hilar lymph nodes, right middle lobe subpleural nodule positive.  No evidence of metastatic disease in the abdomen or pelvis. - MRI of the brain: No metastatic disease. - Right supraclavicular lymph node biopsy (05/25/2023): Metastatic squamous cell carcinoma. - Guardant360: NF1,  BRCA1 (0.08%) - NGS: PD-L1 (SP142) 10%.  TMB-high.  MS-stable.  T p53 pathogenic variant.  HER2 by IHC 0.   2. Social/family history: - Seen today with her Sister [name].  Patient lives by herself.  Quit smoking on 02/22/2021.  Smoked 2 packs/day for 40 years. - Mother died of ovarian cancer.  Brother died of colon cancer at age 72.  Sister had melanoma.  Paternal uncles had stomach cancer and lung cancer.  Paternal aunt had thyroid cancer.  Another paternal aunt had pancreatic cancer.   3.  Supraglottic squamous cell carcinoma (T1N1): - Definitive radiation from 08/16/2021 through 09/13/2021    Plan: Recurrent squamous cell lung cancer: - We discussed Guardant360 and NGS testing results which did not show any targetable mutations. - As her disease has progressed 3 months after stopping consolidation durvalumab, I do not believe immunotherapy based regimens would be helpful in controlling her disease. - We discussed chemotherapy options with combination of carboplatin and paclitaxel.  We have also discussed single agent chemotherapy with docetaxel+/-Cyramza which may be better tolerable.  She is concerned about side effects and quality of life. - We also discussed best supportive care in the form of hospice. - She would like to discuss with her son and let us know.  I have also encouraged seeking a second opinion at a tertiary center to look into clinical trial options. - She will call and let us know.  If she decides to do chemotherapy, she will likely start after holidays.  2.  Supraglottic squamous cell carcinoma (T1N1): - Scan on 05/18/2023 did not show any local recurrence.  3.  Throat pain and sternal chest pain: - Continue hydrocodone liquid 15 mL every 6 hours as needed.  4.  Hypokalemia: - Continue potassium 20 mEq daily.  Potassium is normal.  5.  Hypothyroidism: - Continue  Synthroid 100 mcg daily.    No orders of the defined types were placed in this  encounter.     Alben Deeds Teague,acting as a Neurosurgeon for Doreatha Massed, MD.,have documented all relevant documentation on the behalf of Doreatha Massed, MD,as directed by  Doreatha Massed, MD while in the presence of Doreatha Massed, MD.  I, Doreatha Massed MD, have reviewed the above documentation for accuracy and completeness, and I agree with the above.     Doreatha Massed, MD   12/5/20245:53 PM  CHIEF COMPLAINT:   Diagnosis: stage III squamous cell lung cancer    Cancer Staging  Carcinoma of upper-outer quadrant of left female breast [facility]) Staging form: Breast, AJCC 7th Edition - Clinical stage from 07/01/2014: Stage IIA (T2, N0, M0) - Unsigned - Pathologic stage from 08/20/2014: Stage IIB (T2, N1a, cM0) - Unsigned  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HCC) Staging form: Larynx - Supraglottis, AJCC 8th Edition - Clinical stage from 07/30/2021: cT1, cN1 - Unsigned  Squamous cell lung cancer (HCC) Staging form: Lung, AJCC 8th Edition - Clinical stage from 09/08/2021: Stage IIIB (cT1b, cN3, cM0) - Signed by Lonie Peak, MD on 09/08/2021    Prior Therapy: Chemoradiation therapy with weekly carboplatin and paclitaxel from 11/01/2021, week for chemotherapy on 11/23/2021.  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    06/24/2014 Breast US   Left breast: ill-defined predominantly hypoechoic area with some increased echogenicity in the 2 o'clock position of the left breast, 7 cm from the nipple. 2.6 x 2.3 x 1.8 cm in maximum dimensions.   07/01/2014 Initial Biopsy   Left breast core needle bx: Invasive  mammary carcinoma with lobular features, ER+ (100%), PR+ (91%), HER2/neu negative (ratio 1.09), Ki-67 32%. E-cadherin strongly positive, diagnostic for IDC    07/01/2014 Clinical Stage   Stage IIA: T2 N0   08/20/2014 Definitive Surgery   Left breast seed localized lumpectomy/SLNB: IDC, +LVI, DCIS, 1 LN removed and positive for metastatic carcinoma. Grade 2. HER2/neu repeated and negative (ratio 0.69-1.9).    08/20/2014 Pathologic Stage   Stage IIB: pT2 pN1a pMx   09/17/2014 Surgery   Port-a cath placement and re-excision   10/02/2014 - 01/15/2015 Chemotherapy   CMF x 6 cycles.  patient refused Taxane-containing chemotherapy.   03/07/2015 Procedure   Comp Cancer Panel reveals no variant at APC, ATM, AXIN2, BARD1, BMPR1A, BRCA1, BRCA2, BRIP1, CDH1, CDK4, CDKN2A, CHEK2, FANCC, MLH1, MSH2, MSH6, MUTYH, NBN, PALB2, PMS2, POLD1, POLE, PTEN, RAD51C, RAD51D, SMAD4, STK11, TP53, VHL, and XRCC2.    04/06/2015 - 05/21/2015 Radiation Therapy   Adjuvant RT Michell Heinrich): Left breast/ 45 Gy over 25 fractions.   Left supraclavicular fossa and axilla/ 45 Gy over 25 fractions. Left breast boost/ 16 Gy over 8 fractions. Total dose: 61 Gy   06/04/2015 - 08/07/2015 Anti-estrogen oral therapy   Exemestane 25 mg daily.  Planned duration of therapy 5 years.    07/23/2015 Survivorship   Survivorship care plan completed and mailed to patient in lieu of in person visit   08/06/2015 Adverse Reaction   Severe hot flashes and mood swings   08/08/2015 - 12/07/2015  Anti-estrogen oral therapy   Arimidex   12/04/2015 Adverse Reaction   Worsening depression, patient stopped Arimidex   12/07/2015 -  Anti-estrogen oral therapy   Tamoxifen   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HCC)  07/30/2021 Initial Diagnosis   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HCC) P 16 positive cT1N1   08/16/2021 - 09/13/2021 Radiation Therapy   Completed definitive radiation   09/30/2021 Pathology Results   Guardant 360 Pathology PDL 1 29%   Squamous  cell lung cancer (HCC)  07/23/2021 Imaging    PET/CT  demonstrated small right glottic lesion is mildly hypermetabolic and consistent with no neoplasm.  PET also demonstrated borderline right level 2 lymph node left supraclavicular, mediastinal and hilar lymphadenopathy and hypermetabolic left lower lobe pulmonary nodule all suspicious of metastatic focus.  No abdominal pelvic metastatic disease or osseous metastatic disease was appreciated.   08/31/2021 Initial Diagnosis   Squamous cell carcinoma of lung (HCC)   09/08/2021 Cancer Staging   Staging form: Lung, AJCC 8th Edition - Clinical stage from 09/08/2021: Stage IIIB (cT1b, cN3, cM0) - Signed by Lonie Peak, MD on 09/08/2021 Stage prefix: Initial diagnosis   09/30/2021 Pathology Results   Guardant 360 Pathology PDL 1 29%   11/01/2021 - 11/23/2021 Chemotherapy   Patient is on Treatment Plan : LUNG Carboplatin / Paclitaxel + XRT q7d     02/01/2022 - 03/15/2022 Chemotherapy   Patient is on Treatment Plan : LUNG Durvalumab q14d     02/01/2022 -  Chemotherapy   Patient is on Treatment Plan : LUNG Durvalumab (10) q14d        INTERVAL HISTORY:   Regina Mcmahon is a 72 y.o. female presenting to clinic today for follow up of stage III squamous cell lung cancer. She was last seen by me on 11/724.  Today, she states that she is doing well overall. Her appetite level is at 100%. Her energy level is at 50-60%. She is accompanied by her sister.   She reports worsened SOB since her hospitalization and being on oxygen. She reports 2 episodes of epistaxis and one episode of hemoptysis. She has a productive cough with dark phlegm. She would like to maintain her quality of life as  much as possible while receiving chemotherapy treatment.   PAST MEDICAL HISTORY:   Past Medical History: Past Medical History:  Diagnosis Date   Arthritis    Asthma    Asymptomatic varicose veins    Breast cancer (HCC) 06/2014   left   Cancer (HCC)    Supra Glottis    Chronic airway obstruction (HCC)    Complication of anesthesia 2016   difficulty waking up, Oxygen dropped low.   COPD (chronic obstructive pulmonary disease) (HCC)    Depression    Dyspnea    GERD (gastroesophageal reflux disease)    Hemorrhage rectum    and anus.   Hypertension    Insomnia    Other symptoms involving digestive system(787.99)    Pain    Hx.pain in joint involving pelvic region and thigh; pain in limb   Palpitations    Panic attacks    Personal history of chemotherapy 2016   Personal history of radiation therapy 2016   Pneumonia    Pre-diabetes    Prediabetes    Sinusitis    Symptomatic menopausal or female climacteric states    Varicose veins of both lower extremities with pain     Surgical History: Past Surgical History:  Procedure Laterality Date   BREAST BIOPSY Left 10/2012   BREAST  BIOPSY Left 07/01/2014   malignant   BREAST LUMPECTOMY Left 08/20/2014   BRONCHIAL BIOPSY  08/23/2021   Procedure: BRONCHIAL BIOPSIES;  Surgeon: Leslye Peer, MD;  Location: [facility] ENDOSCOPY;  Service: Pulmonary;;   BRONCHIAL BRUSHINGS  08/23/2021   Procedure: BRONCHIAL BRUSHINGS;  Surgeon: Leslye Peer, MD;  Location: [facility] ENDOSCOPY;  Service: Pulmonary;;   BRONCHIAL NEEDLE ASPIRATION BIOPSY  08/23/2021   Procedure: BRONCHIAL NEEDLE ASPIRATION BIOPSIES;  Surgeon: Leslye Peer, MD;  Location: [facility] ENDOSCOPY;  Service: Pulmonary;;   BRONCHIAL WASHINGS  08/23/2021   Procedure: BRONCHIAL WASHINGS;  Surgeon: Leslye Peer, MD;  Location: [facility] ENDOSCOPY;  Service: Pulmonary;;   CESAREAN SECTION     CHOLECYSTECTOMY  1985   COLONOSCOPY  2002   Dr. Cleotis Nipper: internal hemorrhoids, one small rectal polyp, adenomatous path    COLONOSCOPY N/A 11/23/2015   Procedure: COLONOSCOPY;  Surgeon: West Bali, MD;  Location: [facility] ENDO SUITE;  Service: Endoscopy;  Laterality: N/A;  1100 - moved to 10:45 - office to notify   ESOPHAGOGASTRODUODENOSCOPY N/A 11/23/2015   Procedure:  ESOPHAGOGASTRODUODENOSCOPY (EGD);  Surgeon: West Bali, MD;  Location: [facility] END[facility]ITE;  Service: Endoscopy;  Laterality: N/A;   IR IMAGING GUIDED PORT INSERTION  11/08/2021   PORT-A-CATH REMOVAL  02/2015   PORTACATH PLACEMENT Right 09/17/2014   Procedure: INSERTION PORT-A-CATH WITH ULTRASOUND;  Surgeon: Harriette Bouillon, MD;  Location: [facility];  Service: General;  Laterality: Right;  IJ   RADIOACTIVE SEED GUIDED PARTIAL MASTECTOMY WITH AXILLARY SENTINEL LYMPH NODE BIOPSY Left 08/20/2014   Procedure: LEFT BREAST LUMPECTOMY WITH RADIOACTIVE SEED LOCALIZATION AND SENTINEL LYMPH NODE MAPPING;  Surgeon: Harriette Bouillon, MD;  Location: [facility];  Service: General;  Laterality: Left;   RE-EXCISION OF BREAST LUMPECTOMY Left 09/17/2014   Procedure: RE-EXCISION OF BREAST LUMPECTOMY;  Surgeon: Harriette Bouillon, MD;  Location: [facility];  Service: General;  Laterality: Left;   TOTAL HIP ARTHROPLASTY Right 02/22/2021   Procedure: RIGHT TOTAL HIP ARTHROPLASTY ANTERIOR APPROACH;  Surgeon: Eldred Manges, MD;  Location: [facility] OR;  Service: Orthopedics;  Laterality: Right;   TUBAL LIGATION     VIDEO BRONCHOSCOPY WITH ENDOBRONCHIAL ULTRASOUND N/A 08/23/2021   Procedure: VIDEO BRONCHOSCOPY WITH ENDOBRONCHIAL ULTRASOUND;  Surgeon: Leslye Peer, MD;  Location: [facility] ENDOSCOPY;  Service: Pulmonary;  Laterality: N/A;   VIDEO BRONCHOSCOPY WITH RADIAL ENDOBRONCHIAL ULTRASOUND  08/23/2021   Procedure: VIDEO BRONCHOSCOPY WITH RADIAL ENDOBRONCHIAL ULTRASOUND;  Surgeon: Leslye Peer, MD;  Location: [facility] ENDOSCOPY;  Service: Pulmonary;;    Social History: Social History   Socioeconomic History   Marital status: Divorced    Spouse name: Not on file   Number of children: 2   Years of education: Not on file   Highest education level: Not on file  Occupational History   Not on file  Tobacco Use   Smoking status: Former    Current packs/day: 0.00    Average packs/day: 1 pack/day for  42.0 years (42.0 ttl pk-yrs)    Types: Cigarettes    Start date: 02/23/1979    Quit date: 02/22/2021    Years since quitting: 2.3   Smokeless tobacco: Never  Vaping Use   Vaping status: Never Used  Substance and Sexual Activity   Alcohol use: No    Alcohol/week: 0.0 standard drinks of alcohol   Drug use: No   Sexual activity: Not Currently    Birth control/protection: None  Other Topics Concern   Not on file  Social History Narrative   Not on file   Social Determinants of Health   Financial Resource Strain: Not on file  Food Insecurity: No Food Insecurity (05/29/2023)   Hunger Vital Sign    Worried About Running Out of Food in the Last Year: Never true    Ran Out of Food in the Last Year: Never true  Transportation Needs: No Transportation Needs (05/29/2023)   PRAPARE - Administrator, Civil Service (Medical): No    Lack of Transportation (Non-Medical): No  Physical Activity: Not on file  Stress: Not on file  Social Connections: Unknown (08/05/2021)   Social Connection and Isolation Panel [NHANES]    Frequency of Communication with Friends and Family: More than three times a week    Frequency of Social Gatherings with Friends and Family: More than three times a week    Attends Religious Services: Not on file    Active Member of Clubs or Organizations: Not on file    Attends Banker Meetings: Not on file    Marital Status: Not on file  Intimate Partner Violence: Not At Risk (05/29/2023)   Humiliation, Afraid, Rape, and Kick questionnaire    Fear of Current or Ex-Partner: No    Emotionally Abused: No    Physically Abused: No    Sexually Abused: No    Family History: Family History  Problem Relation Age of Onset   Diabetes Mother    Ovarian cancer Mother 38   Other Father        died in MVA at age 71   Other Sister        mitral valve disorder   Melanoma Sister 23       removed from back of leg   Colon cancer Brother        early 37s, succumbed  to disease   Cancer Paternal Aunt        leukemia, bone, or lung cancer   Alzheimer's disease Maternal Grandmother    Heart attack Maternal Grandfather    Heart attack Paternal Grandmother    COPD Paternal Grandfather    Emphysema Paternal Grandfather    Congestive Heart Failure Paternal Aunt    Stomach cancer Paternal Uncle    Kidney cancer Maternal Uncle    Cancer Cousin        dx. teens/dx. 40s   Cancer Cousin    Colon cancer Cousin     Current Medications:  Current Outpatient Medications:    albuterol (VENTOLIN HFA) 108 (90 Base) MCG/ACT inhaler, Inhale 2 puffs into the lungs every 4 (four) hours as needed for shortness of breath., Disp: , Rfl:    ALPRAZolam (XANAX) 0.5 MG tablet, Take 0.5 mg by mouth 3 (three) times daily., Disp: , Rfl:    ANORO ELLIPTA 62.5-25 MCG/INH AEPB, Inhale 1 puff into the lungs daily., Disp: , Rfl:    calcium carbonate (TUMS - DOSED IN MG ELEMENTAL CALCIUM) 500 MG chewable tablet, Chew 2 tablets by mouth 3 (three) times daily as needed for indigestion or heartburn., Disp: , Rfl:    furosemide (LASIX) 20 MG tablet, Take 20 mg by mouth daily., Disp: , Rfl:    hydrochlorothiazide (HYDRODIURIL) 12.5 MG tablet, Take 12.5 mg by mouth daily., Disp: , Rfl:    HYDROcodone-acetaminophen (HYCET) 7.5-325 mg/15 ml solution, Take 15 mLs by mouth every 6 (six) hours as needed for moderate pain (pain score 4-6)., Disp: 473 mL, Rfl: 0   ibuprofen (ADVIL) 100 MG/5ML suspension, Take  10 mLs (200 mg total) by mouth every 4 (four) hours as needed for moderate pain., Disp: 473 mL, Rfl: 0   lidocaine (XYLOCAINE) 2 % solution, Patient: Mix 1part 2% viscous lidocaine, 1part H20. Swallow 10mL of diluted mixture, before meals and at bedtime, up to QID for sore throat., Disp: 200 mL, Rfl: 3   Menthol, Topical Analgesic, (BIOFREEZE EX), Apply 1 application topically daily as needed (pain)., Disp: , Rfl:    pantoprazole (PROTONIX) 40 MG tablet, Take 1 tablet by mouth once daily,  Disp: 90 tablet, Rfl: 0   polyethylene glycol (MIRALAX / GLYCOLAX) 17 g packet, Take 17 g by mouth daily as needed for mild constipation., Disp: , Rfl:    sertraline (ZOLOFT) 100 MG tablet, Take 100 mg by mouth daily., Disp: , Rfl:    levothyroxine (SYNTHROID) 100 MCG tablet, TAKE 1 TABLET BY MOUTH ONCE DAILY BEFORE BREAKFAST (Patient not taking: Reported on 06/29/2023), Disp: 90 tablet, Rfl: 0   lidocaine-prilocaine (EMLA) cream, APPLY CREAM TOPICALLY AS NEEDED (Patient not taking: Reported on 06/29/2023), Disp: 30 g, Rfl: 3   Allergies: No Known Allergies  REVIEW OF SYSTEMS:   Review of Systems  Constitutional:  Negative for chills, fatigue and fever.  HENT:   Positive for nosebleeds (2 episodes). Negative for lump/mass, mouth sores, sore throat and trouble swallowing.   Eyes:  Negative for eye problems.  Respiratory:  Positive for cough, hemoptysis (one episode) and shortness of breath.   Cardiovascular:  Positive for chest pain and palpitations. Negative for leg swelling.  Gastrointestinal:  Negative for abdominal pain, constipation, diarrhea, nausea and vomiting.  Genitourinary:  Negative for bladder incontinence, difficulty urinating, dysuria, frequency, hematuria and nocturia.   Musculoskeletal:  Negative for arthralgias, back pain, flank pain, myalgias and neck pain.  Skin:  Negative for itching and rash.  Neurological:  Positive for dizziness and headaches. Negative for numbness.  Hematological:  Does not bruise/bleed easily.  Psychiatric/Behavioral:  Negative for depression, sleep disturbance and suicidal ideas. The patient is not nervous/anxious.   All other systems reviewed and are negative.    VITALS:   Blood pressure 139/85, pulse 99, temperature (!) 97.3 F (36.3 C), temperature source Tympanic, resp. rate 20, weight 209 lb 3.2 oz (94.9 kg), SpO2 94%.  Wt Readings from Last 3 Encounters:  06/29/23 209 lb 3.2 oz (94.9 kg)  06/01/23 205 lb 12.8 oz (93.4 kg)  05/30/23 205  lb 7.5 oz (93.2 kg)    Body mass index is 37.06 kg/m.  Performance status (ECOG): 1 - Symptomatic but completely ambulatory  PHYSICAL EXAM:   Physical Exam Vitals and nursing note reviewed. Exam conducted with a chaperone present.  Constitutional:      Appearance: Normal appearance.  Cardiovascular:     Rate and Rhythm: Normal rate and regular rhythm.     Pulses: Normal pulses.     Heart sounds: Normal heart sounds.  Pulmonary:     Effort: Pulmonary effort is normal.     Breath sounds: Normal breath sounds.  Abdominal:     Palpations: Abdomen is soft. There is no hepatomegaly, splenomegaly or mass.     Tenderness: There is no abdominal tenderness.  Musculoskeletal:     Right lower leg: No edema.     Left lower leg: No edema.  Lymphadenopathy:     Cervical: No cervical adenopathy.     Right cervical: No superficial, deep or posterior cervical adenopathy.    Left cervical: No superficial, deep or posterior cervical adenopathy.  Upper Body:     Right upper body: No supraclavicular or axillary adenopathy.     Left upper body: No supraclavicular or axillary adenopathy.  Neurological:     General: No focal deficit present.     Mental Status: She is alert and oriented to person, place, and time.  Psychiatric:        Mood and Affect: Mood normal.        Behavior: Behavior normal.     LABS:      Latest Ref Rng & Units 05/30/2023    4:08 AM 05/29/2023   12:32 AM 05/01/2023   11:45 AM  CBC  WBC 4.0 - 10.5 K/uL 12.8  11.8  6.2   Hemoglobin 12.0 - 15.0 g/dL 40.9  81.1  91.4   Hematocrit 36.0 - 46.0 % 42.0  43.8  44.3   Platelets 150 - 400 K/uL 203  183  223       Latest Ref Rng & Units 05/30/2023    4:08 AM 05/29/2023   12:32 AM 05/01/2023   11:45 AM  CMP  Glucose 70 - 99 mg/dL 782  956  213   BUN 8 - 23 mg/dL 25  15  17    Creatinine 0.44 - 1.00 mg/dL 0.86  5.78  4.69   Sodium 135 - 145 mmol/L 135  136  135   Potassium 3.5 - 5.1 mmol/L 3.7  3.9  3.8   Chloride 98 -  111 mmol/L 101  101  101   CO2 22 - 32 mmol/L 25  27  25    Calcium 8.9 - 10.3 mg/dL 9.2  9.4  9.1   Total Protein 6.5 - 8.1 g/dL 6.9  7.4  7.4   Total Bilirubin <1.2 mg/dL 1.8  2.5  2.1   Alkaline Phos 38 - 126 U/L 61  67  67   AST 15 - 41 U/L 14  17  15    ALT 0 - 44 U/L 12  13  13       Lab Results  Component Value Date   CEA1 4.53 11/01/2021   /  CEA ([facility])  Date Value Ref Range Status  11/01/2021 4.53 0.00 - 5.00 ng/mL Final    Comment:    (NOTE) This test was performed using Architect's Chemiluminescent Microparticle Immunoassay. Values obtained from different assay methods cannot be used interchangeably. Please note that 5-10% of patients who smoke may see CEA levels up to 6.9 ng/mL. Performed at [facility], [address], [place], [place] [address]    No results found for: "PSA1" No results found for: "CAN199" Lab Results  Component Value Date   CAN125 18.9 08/10/2021    No results found for: "TOTALPROTELP", "ALBUMINELP", "A1GS", "A2GS", "BETS", "BETA2SER", "GAMS", "MSPIKE", "SPEI" No results found for: "TIBC", "FERRITIN", "IRONPCTSAT" No results found for: "LDH"   STUDIES:   No results found.

## 2023-06-29 ENCOUNTER — Inpatient Hospital Stay: Payer: 59 | Attending: Hematology and Oncology | Admitting: Hematology

## 2023-06-29 VITALS — BP 139/85 | HR 99 | Temp 97.3°F | Resp 20 | Wt 209.2 lb

## 2023-06-29 DIAGNOSIS — R072 Precordial pain: Secondary | ICD-10-CM | POA: Insufficient documentation

## 2023-06-29 DIAGNOSIS — Z825 Family history of asthma and other chronic lower respiratory diseases: Secondary | ICD-10-CM | POA: Insufficient documentation

## 2023-06-29 DIAGNOSIS — Z9049 Acquired absence of other specified parts of digestive tract: Secondary | ICD-10-CM | POA: Insufficient documentation

## 2023-06-29 DIAGNOSIS — Z860101 Personal history of adenomatous and serrated colon polyps: Secondary | ICD-10-CM | POA: Diagnosis not present

## 2023-06-29 DIAGNOSIS — Z79899 Other long term (current) drug therapy: Secondary | ICD-10-CM | POA: Insufficient documentation

## 2023-06-29 DIAGNOSIS — E876 Hypokalemia: Secondary | ICD-10-CM | POA: Diagnosis not present

## 2023-06-29 DIAGNOSIS — C3490 Malignant neoplasm of unspecified part of unspecified bronchus or lung: Secondary | ICD-10-CM

## 2023-06-29 DIAGNOSIS — Z800 Family history of malignant neoplasm of digestive organs: Secondary | ICD-10-CM | POA: Diagnosis not present

## 2023-06-29 DIAGNOSIS — K219 Gastro-esophageal reflux disease without esophagitis: Secondary | ICD-10-CM | POA: Insufficient documentation

## 2023-06-29 DIAGNOSIS — R070 Pain in throat: Secondary | ICD-10-CM | POA: Insufficient documentation

## 2023-06-29 DIAGNOSIS — Z8719 Personal history of other diseases of the digestive system: Secondary | ICD-10-CM | POA: Diagnosis not present

## 2023-06-29 DIAGNOSIS — C321 Malignant neoplasm of supraglottis: Secondary | ICD-10-CM | POA: Diagnosis not present

## 2023-06-29 DIAGNOSIS — Z8041 Family history of malignant neoplasm of ovary: Secondary | ICD-10-CM | POA: Diagnosis not present

## 2023-06-29 DIAGNOSIS — F32A Depression, unspecified: Secondary | ICD-10-CM | POA: Diagnosis not present

## 2023-06-29 DIAGNOSIS — Z9012 Acquired absence of left breast and nipple: Secondary | ICD-10-CM | POA: Insufficient documentation

## 2023-06-29 DIAGNOSIS — Z923 Personal history of irradiation: Secondary | ICD-10-CM | POA: Insufficient documentation

## 2023-06-29 DIAGNOSIS — Z801 Family history of malignant neoplasm of trachea, bronchus and lung: Secondary | ICD-10-CM | POA: Diagnosis not present

## 2023-06-29 DIAGNOSIS — Z833 Family history of diabetes mellitus: Secondary | ICD-10-CM | POA: Diagnosis not present

## 2023-06-29 DIAGNOSIS — D696 Thrombocytopenia, unspecified: Secondary | ICD-10-CM | POA: Insufficient documentation

## 2023-06-29 DIAGNOSIS — Z853 Personal history of malignant neoplasm of breast: Secondary | ICD-10-CM | POA: Diagnosis not present

## 2023-06-29 DIAGNOSIS — Z8249 Family history of ischemic heart disease and other diseases of the circulatory system: Secondary | ICD-10-CM | POA: Insufficient documentation

## 2023-06-29 DIAGNOSIS — E039 Hypothyroidism, unspecified: Secondary | ICD-10-CM | POA: Insufficient documentation

## 2023-06-29 DIAGNOSIS — Z79890 Hormone replacement therapy (postmenopausal): Secondary | ICD-10-CM | POA: Insufficient documentation

## 2023-06-29 DIAGNOSIS — Z87891 Personal history of nicotine dependence: Secondary | ICD-10-CM | POA: Insufficient documentation

## 2023-06-29 DIAGNOSIS — Z806 Family history of leukemia: Secondary | ICD-10-CM | POA: Insufficient documentation

## 2023-06-29 DIAGNOSIS — Z808 Family history of malignant neoplasm of other organs or systems: Secondary | ICD-10-CM | POA: Insufficient documentation

## 2023-06-29 DIAGNOSIS — Z9221 Personal history of antineoplastic chemotherapy: Secondary | ICD-10-CM | POA: Diagnosis not present

## 2023-06-29 DIAGNOSIS — C3432 Malignant neoplasm of lower lobe, left bronchus or lung: Secondary | ICD-10-CM | POA: Insufficient documentation

## 2023-06-29 DIAGNOSIS — Z8051 Family history of malignant neoplasm of kidney: Secondary | ICD-10-CM | POA: Insufficient documentation

## 2023-06-29 DIAGNOSIS — J449 Chronic obstructive pulmonary disease, unspecified: Secondary | ICD-10-CM | POA: Diagnosis not present

## 2023-06-29 NOTE — Patient Instructions (Signed)
[facility] at [facility] Discharge Instructions   You were seen and examined today by Dr. Ellin Saba.  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   The only option for treatment is chemotherapy. Dr. Kirtland Bouchard discussed with you treatment with 2 drugs. One is a chemo drug called Taxotere. The other is an antibody that cuts off the blood supply to the cancer cells. It is called Cyramza. These 2 drugs are given in the clinic once every 3 weeks.    Thank you for choosing [facility] at [facility] to provide your oncology and hematology care.  To afford each patient quality time with our provider, please arrive at least 15 minutes before your scheduled appointment time.   If you have a lab appointment with the Cancer Center please come in thru the Main Entrance and check in at the main information desk.  You need to re-schedule your appointment should you arrive 10 or more minutes late.  We strive to give you quality time with our providers, and arriving late affects you and other patients whose appointments are after yours.  Also, if you no show three or more times for appointments you may be dismissed from the clinic at the providers discretion.     Again, thank you for choosing [facility].  Our hope is that these requests will decrease the amount of time that you wait before being seen by our physicians.       _____________________________________________________________  Should you have questions after your visit to [facility], please contact our office at [phone] and follow the prompts.  Our office hours are 8:00 a.m. and 4:30 p.m. Monday - Friday.  Please note that voicemails left after 4:00 p.m. may not be returned until the following business day.  We are closed weekends and major holidays.  You do have access to a  nurse 24-7, just call the main number to the clinic [phone] and do not press any options, hold on the line and a nurse will answer the phone.    For prescription refill requests, have your pharmacy contact our office and allow 72 hours.    Due to Covid, you will need to wear a mask upon entering the hospital. If you do not have a mask, a mask will be given to you at the Main Entrance upon arrival. For doctor visits, patients may have 1 support person age 52 or older with them. For treatment visits, patients can not have anyone with them due to social distancing guidelines and our immunocompromised population.

## 2023-07-05 ENCOUNTER — Other Ambulatory Visit: Payer: Self-pay | Admitting: *Deleted

## 2023-07-06 ENCOUNTER — Encounter: Payer: Self-pay | Admitting: Hematology and Oncology

## 2023-07-06 ENCOUNTER — Other Ambulatory Visit: Payer: Self-pay

## 2023-07-06 ENCOUNTER — Encounter: Payer: Self-pay | Admitting: Hematology

## 2023-07-06 DIAGNOSIS — C3490 Malignant neoplasm of unspecified part of unspecified bronchus or lung: Secondary | ICD-10-CM

## 2023-07-06 DIAGNOSIS — R0602 Shortness of breath: Secondary | ICD-10-CM

## 2023-07-06 DIAGNOSIS — C321 Malignant neoplasm of supraglottis: Secondary | ICD-10-CM

## 2023-07-06 MED ORDER — HYDROCODONE-ACETAMINOPHEN 7.5-325 MG/15ML PO SOLN: 15.0000 mL | Freq: Four times a day (QID) | ORAL | 0 refills | Status: DC | PRN

## 2023-07-13 ENCOUNTER — Other Ambulatory Visit: Payer: Self-pay | Admitting: *Deleted

## 2023-07-13 MED ORDER — HYDROCODONE-ACETAMINOPHEN 7.5-325 MG/15ML PO SOLN: 15.0000 mL | Freq: Four times a day (QID) | ORAL | 0 refills | Status: DC | PRN

## 2023-07-14 ENCOUNTER — Other Ambulatory Visit: Payer: Self-pay

## 2023-07-14 DIAGNOSIS — C3490 Malignant neoplasm of unspecified part of unspecified bronchus or lung: Secondary | ICD-10-CM

## 2023-07-14 NOTE — Progress Notes (Signed)
Order placed for social work for individual counseling per patient and sister request.

## 2023-07-16 ENCOUNTER — Other Ambulatory Visit: Payer: Self-pay

## 2023-07-20 ENCOUNTER — Inpatient Hospital Stay: Payer: 59 | Admitting: Licensed Clinical Social Worker

## 2023-07-20 DIAGNOSIS — C3490 Malignant neoplasm of unspecified part of unspecified bronchus or lung: Secondary | ICD-10-CM

## 2023-07-20 NOTE — Progress Notes (Signed)
[facility] CSW Progress Note  Visual merchandiser met with patient and her sister [name] to discuss supportive services.  Pt reports she believes she has been approved for Medicaid, but does not know what her coverage is and does not have a benefit card.  Pt reports she is starting to need assistance at home to take care of household chores and errands.  Pt resides alone w/ a sister residing approximately 15 minutes away, a daughter in [place], and a son in [place].  Pt primarily receives support from her sister as her daughter works full time and has 5 children.  CSW discussed the possibility of PCA services if she has full Medicaid coverage and informed of the process to apply for these services.  CSW informed pt and her sister of the limits for Medicare home care coverage.  Pt referred to Marijean Niemann for additional support and informed of the [employer].  Acknowledgement signed and forwarded to April Manson.  Pt will bring in proof of income and award letter for food stamps at her next appointment.  CSW to remain available as appropriate to provide support throughout duration of treatment.      [name], LCSW Clinical Social Worker [facility]

## 2023-07-25 ENCOUNTER — Inpatient Hospital Stay: Payer: 59

## 2023-07-25 ENCOUNTER — Inpatient Hospital Stay (HOSPITAL_BASED_OUTPATIENT_CLINIC_OR_DEPARTMENT_OTHER): Payer: 59 | Admitting: Hematology

## 2023-07-25 VITALS — BP 121/77 | HR 90 | Temp 97.7°F | Resp 21

## 2023-07-25 DIAGNOSIS — E039 Hypothyroidism, unspecified: Secondary | ICD-10-CM | POA: Diagnosis not present

## 2023-07-25 DIAGNOSIS — E876 Hypokalemia: Secondary | ICD-10-CM

## 2023-07-25 DIAGNOSIS — Z860101 Personal history of adenomatous and serrated colon polyps: Secondary | ICD-10-CM | POA: Diagnosis not present

## 2023-07-25 DIAGNOSIS — K219 Gastro-esophageal reflux disease without esophagitis: Secondary | ICD-10-CM | POA: Diagnosis not present

## 2023-07-25 DIAGNOSIS — Z833 Family history of diabetes mellitus: Secondary | ICD-10-CM | POA: Diagnosis not present

## 2023-07-25 DIAGNOSIS — Z9012 Acquired absence of left breast and nipple: Secondary | ICD-10-CM | POA: Diagnosis not present

## 2023-07-25 DIAGNOSIS — C321 Malignant neoplasm of supraglottis: Secondary | ICD-10-CM | POA: Diagnosis not present

## 2023-07-25 DIAGNOSIS — Z8249 Family history of ischemic heart disease and other diseases of the circulatory system: Secondary | ICD-10-CM | POA: Diagnosis not present

## 2023-07-25 DIAGNOSIS — Z9221 Personal history of antineoplastic chemotherapy: Secondary | ICD-10-CM | POA: Diagnosis not present

## 2023-07-25 DIAGNOSIS — Z800 Family history of malignant neoplasm of digestive organs: Secondary | ICD-10-CM | POA: Diagnosis not present

## 2023-07-25 DIAGNOSIS — D696 Thrombocytopenia, unspecified: Secondary | ICD-10-CM | POA: Diagnosis not present

## 2023-07-25 DIAGNOSIS — Z87891 Personal history of nicotine dependence: Secondary | ICD-10-CM | POA: Diagnosis not present

## 2023-07-25 DIAGNOSIS — Z801 Family history of malignant neoplasm of trachea, bronchus and lung: Secondary | ICD-10-CM | POA: Diagnosis not present

## 2023-07-25 DIAGNOSIS — Z8719 Personal history of other diseases of the digestive system: Secondary | ICD-10-CM | POA: Diagnosis not present

## 2023-07-25 DIAGNOSIS — Z9049 Acquired absence of other specified parts of digestive tract: Secondary | ICD-10-CM | POA: Diagnosis not present

## 2023-07-25 DIAGNOSIS — Z853 Personal history of malignant neoplasm of breast: Secondary | ICD-10-CM | POA: Diagnosis not present

## 2023-07-25 DIAGNOSIS — R072 Precordial pain: Secondary | ICD-10-CM | POA: Diagnosis not present

## 2023-07-25 DIAGNOSIS — Z79899 Other long term (current) drug therapy: Secondary | ICD-10-CM | POA: Diagnosis not present

## 2023-07-25 DIAGNOSIS — R070 Pain in throat: Secondary | ICD-10-CM | POA: Diagnosis not present

## 2023-07-25 DIAGNOSIS — C3432 Malignant neoplasm of lower lobe, left bronchus or lung: Secondary | ICD-10-CM | POA: Diagnosis not present

## 2023-07-25 DIAGNOSIS — Z923 Personal history of irradiation: Secondary | ICD-10-CM | POA: Diagnosis not present

## 2023-07-25 LAB — POTASSIUM: Potassium: 4.1 mmol/L (ref 3.5–5.1)

## 2023-07-25 LAB — TSH: TSH: 28.633 u[IU]/mL — ABNORMAL HIGH (ref 0.350–4.500)

## 2023-07-25 NOTE — Progress Notes (Signed)
 [facility] [address] [place] [address]    Clinic Day:  07/25/2023  Referring physician: Shona Norleen PEDLAR, MD  Patient Care Team: Regina Norleen PEDLAR, MD as PCP - General (Internal Medicine) Regina Pearla LABOR, MD (Inactive) as PCP - Cardiology (Cardiology) Regina Ned, MD as Consulting Physician (General Surgery) Regina Hastings, MD as Consulting Physician (Radiation Oncology) Regina Powell Hummer, NP as Nurse Practitioner (Hematology and Oncology) Regina Regina CROME, MD (Inactive) as Consulting Physician (Gastroenterology) Regina Hai, MD as Medical Oncologist (Medical Oncology) Regina Mcmahon, Regina CROME, RN as Oncology Nurse Navigator Regina Clark, MD as Consulting Physician (Otolaryngology) Regina Domino, MD as Consulting Physician (Radiation Oncology) Regina Ash, MD as Consulting Physician (Hematology and Oncology) Regina Regina Mcmahon SQUIBB, RN as Oncology Nurse Navigator (Medical Oncology)   ASSESSMENT & PLAN:   Assessment: 1. Stage III squamous cell cancer: - LLL lung mass FNA (08/23/2021): Malignant cells consistent with squamous cell carcinoma. - Lymph node 4R and 7: Malignant cells consistent with NSCLC. - Chemoradiation therapy with weekly carboplatin  and paclitaxel  from 11/01/2021, week for chemotherapy on 11/23/2021.  XRT completed on 12/16/2021. - She missed further doses of chemotherapy secondary to thrombocytopenia. - PET scan (01/27/2022): Interval response to therapy with decreasing size of mediastinal lymph nodes.  No new disease. - Consolidation durvalumab  from 02/01/2022 through 02/14/2023 - PET scan (05/18/2023): Right supraclavicular lymph node, mediastinal and left hilar lymph nodes, right middle lobe subpleural nodule positive.  No evidence of metastatic disease in the abdomen or pelvis. - MRI of the brain: No metastatic disease. - Right supraclavicular lymph node biopsy (05/25/2023): Metastatic squamous cell carcinoma. - Guardant360:  NF1, BRCA1 (0.08%) - NGS: PD-L1 (SP142) 10%.  TMB-high.  MS-stable.  T p53 pathogenic variant.  HER2 by IHC 0.   2. Social/family history: - Seen today with her Sister Regina Mcmahon.  Patient lives by herself.  Quit smoking on 02/22/2021.  Smoked 2 packs/day for 40 years. - Mother died of ovarian cancer.  Brother died of colon cancer at age 44.  Sister had melanoma.  Paternal uncles had stomach cancer and lung cancer.  Paternal aunt had thyroid  cancer.  Another paternal aunt had pancreatic cancer.   3.  Supraglottic squamous cell carcinoma (T1N1): - Definitive radiation from 08/16/2021 through 09/13/2021    Plan: Recurrent squamous cell lung cancer: - Today she came back to discuss options again. - I have discussed that since she progressed on immunotherapy within 3 months after stopping adjuvant Durvalumab , she will not likely respond to any further immunotherapy.  We discussed chemotherapy options with single agent docetaxel with or without Cyramza. - We have also discussed best supportive care in the form of hospice. - She reported worsening of dyspnea on exertion.  Also reported some blood in the sputum but she is also having nosebleeds from using oxygen  via nasal cannula.  She is currently using Anoro and albuterol .  We will make a referral to pulmonology. - We will make a referral to palliative care with transition to hospice down the line. - I have answered all her questions thoroughly.  Total time spent is 45 minutes.  2.  Supraglottic squamous cell carcinoma (T1N1): - PET scan on 05/18/2023 did not show any local recurrence.  3.  Throat pain and sternal chest pain: - Continue hydrocodone  liquid 15 mL every 6 hours as needed.  4.  Hypokalemia: - She quit taking potassium 3 months ago.  Potassium today was 4.1.  No need to start back on it.  5.  Hypothyroidism: - She quit taking Synthroid  100 mcg 3 months ago.  TSH today is 28.  We will call her to start back on it.    Orders Placed  This Encounter  Procedures   TSH    Standing Status:   Future    Number of Occurrences:   1    Expected Date:   07/25/2023    Expiration Date:   07/24/2024   Potassium    Standing Status:   Future    Number of Occurrences:   1    Expected Date:   07/25/2023    Expiration Date:   07/24/2024      [name],acting as a scribe for Alean Stands, MD.,have documented all relevant documentation on the behalf of Alean Stands, MD,as directed by  Alean Stands, MD while in the presence of Alean Stands, MD.  I, Alean Stands MD, have reviewed the above documentation for accuracy and completeness, and I agree with the above.      Alean Stands, MD   12/31/20244:45 PM  CHIEF COMPLAINT:   Diagnosis: stage III squamous cell lung cancer    Cancer Staging  Carcinoma of upper-outer quadrant of left female breast [facility]) Staging form: Breast, AJCC 7th Edition - Clinical stage from 07/01/2014: Stage IIA (T2, N0, M0) - Unsigned - Pathologic stage from 08/20/2014: Stage IIB (T2, N1a, cM0) - Unsigned  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HCC) Staging form: Larynx - Supraglottis, AJCC 8th Edition - Clinical stage from 07/30/2021: cT1, cN1 - Unsigned  Squamous cell lung cancer (HCC) Staging form: Lung, AJCC 8th Edition - Clinical stage from 09/08/2021: Stage IIIB (cT1b, cN3, cM0) - Signed by Regina Domino, MD on 09/08/2021    Prior Therapy: Chemoradiation therapy with weekly carboplatin  and paclitaxel  from 11/01/2021, week for chemotherapy on 11/23/2021.  XRT completed on 12/16/2021   Current Therapy:  Durvalumab  q14d    HISTORY OF PRESENT ILLNESS:   Oncology History  Carcinoma of upper-outer quadrant of left female breast (HCC)  06/24/2014 Mammogram   Left breast: 6 x 3 x 2.5 cm area of ill-defined increased density in the UOQ,  corresponding to the mass felt by the patient, not in the same location of the previously biopsied benign calcifications.     06/24/2014 Breast US    Left breast: ill-defined predominantly hypoechoic area with some increased echogenicity in the 2 o'clock position of the left breast, 7 cm from the nipple. 2.6 x 2.3 x 1.8 cm in maximum dimensions.   07/01/2014 Initial Biopsy   Left breast core needle bx: Invasive mammary carcinoma with lobular features, ER+ (100%), PR+ (91%), HER2/neu negative (ratio 1.09), Ki-67 32%. E-cadherin strongly positive, diagnostic for IDC    07/01/2014 Clinical Stage   Stage IIA: T2 N0   08/20/2014 Definitive Surgery   Left breast seed localized lumpectomy/SLNB: IDC, +LVI, DCIS, 1 LN removed and positive for metastatic carcinoma. Grade 2. HER2/neu repeated and negative (ratio 0.69-1.9).    08/20/2014 Pathologic Stage   Stage IIB: pT2 pN1a pMx   09/17/2014 Surgery   Port-a cath placement and re-excision   10/02/2014 - 01/15/2015 Chemotherapy   CMF x 6 cycles.  patient refused Taxane-containing chemotherapy.   03/07/2015 Procedure   Comp Cancer Panel reveals no variant at APC, ATM, AXIN2, BARD1, BMPR1A, BRCA1, BRCA2, BRIP1, CDH1, CDK4, CDKN2A, CHEK2, FANCC, MLH1, MSH2, MSH6, MUTYH, NBN, PALB2, PMS2, POLD1, POLE, PTEN, RAD51C, RAD51D, SMAD4, STK11, TP53, VHL, and XRCC2.    04/06/2015 - 05/21/2015 Radiation Therapy   Adjuvant  RT Signe): Left breast/ 45 Gy over 25 fractions.   Left supraclavicular fossa and axilla/ 45 Gy over 25 fractions. Left breast boost/ 16 Gy over 8 fractions. Total dose: 61 Gy   06/04/2015 - 08/07/2015 Anti-estrogen oral therapy   Exemestane  25 mg daily.  Planned duration of therapy 5 years.    07/23/2015 Survivorship   Survivorship care plan completed and mailed to patient in lieu of in person visit   08/06/2015 Adverse Reaction   Severe hot flashes and mood swings   08/08/2015 - 12/07/2015 Anti-estrogen oral therapy   Arimidex    12/04/2015 Adverse Reaction   Worsening depression, patient stopped Arimidex    12/07/2015 -  Anti-estrogen oral therapy   Tamoxifen     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HCC)  07/30/2021 Initial Diagnosis   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HCC) P 16 positive cT1N1   08/16/2021 - 09/13/2021 Radiation Therapy   Completed definitive radiation   09/30/2021 Pathology Results   Guardant 360 Pathology PDL 1 29%   Squamous cell lung cancer (HCC)  07/23/2021 Imaging    PET/CT  demonstrated small right glottic lesion is mildly hypermetabolic and consistent with no neoplasm.  PET also demonstrated borderline right level 2 lymph node left supraclavicular, mediastinal and hilar lymphadenopathy and hypermetabolic left lower lobe pulmonary nodule all suspicious of metastatic focus.  No abdominal pelvic metastatic disease or osseous metastatic disease was appreciated.   08/31/2021 Initial Diagnosis   Squamous cell carcinoma of lung (HCC)   09/08/2021 Cancer Staging   Staging form: Lung, AJCC 8th Edition - Clinical stage from 09/08/2021: Stage IIIB (cT1b, cN3, cM0) - Signed by Regina Domino, MD on 09/08/2021 Stage prefix: Initial diagnosis   09/30/2021 Pathology Results   Guardant 360 Pathology PDL 1 29%   11/01/2021 - 11/23/2021 Chemotherapy   Patient is on Treatment Plan : LUNG Carboplatin  / Paclitaxel  + XRT q7d     02/01/2022 - 03/15/2022 Chemotherapy   Patient is on Treatment Plan : LUNG Durvalumab  q14d     02/01/2022 -  Chemotherapy   Patient is on Treatment Plan : LUNG Durvalumab  (10) q14d        INTERVAL HISTORY:   Regina Mcmahon is a 72 y.o. female presenting to clinic today for follow up of stage III squamous cell lung cancer. She was last seen by me on 06/29/23.  Today, she states that she is doing well overall. Her appetite level is at 85%. Her energy level is at 0%. She is accompanied by her sister.   She reports dyspnea that is worsened in the morning and on exertion. She also has wheezing. She is unable to talk without being out of breath. Her cough has worsened and she reports hemoptysis. She has bleeding in the nose as well.  She has been using her oxygen  at home more often, which may have caused irritation along the nasal canal. She has 2 inhalers and has used one this morning. She reports Bobetta Creed, her PCP, retired and she would like to know if Dr. Shona will follow-up on her and her medications. She does not have a pulmonologist, as her previous pulmonologist Dr. Vonzell retired.   She is very concerned about the side effects of Taxotere and will likely not proceed with treatment. She has currently stopped Synthroid  and potassium for the last 3 months. She is taking hydrocodone  as prescribed.   PAST MEDICAL HISTORY:   Past Medical History: Past Medical History:  Diagnosis Date   Arthritis    Asthma    Asymptomatic  varicose veins    Breast cancer (HCC) 06/2014   left   Cancer (HCC)    Supra Glottis   Chronic airway obstruction (HCC)    Complication of anesthesia 2016   difficulty waking up, Oxygen  dropped low.   COPD (chronic obstructive pulmonary disease) (HCC)    Depression    Dyspnea    GERD (gastroesophageal reflux disease)    Hemorrhage rectum    and anus.   Hypertension    Insomnia    Other symptoms involving digestive system(787.99)    Pain    Hx.pain in joint involving pelvic region and thigh; pain in limb   Palpitations    Panic attacks    Personal history of chemotherapy 2016   Personal history of radiation therapy 2016   Pneumonia    Pre-diabetes    Prediabetes    Sinusitis    Symptomatic menopausal or female climacteric states    Varicose veins of both lower extremities with pain     Surgical History: Past Surgical History:  Procedure Laterality Date   BREAST BIOPSY Left 10/2012   BREAST BIOPSY Left 07/01/2014   malignant   BREAST LUMPECTOMY Left 08/20/2014   BRONCHIAL BIOPSY  08/23/2021   Procedure: BRONCHIAL BIOPSIES;  Surgeon: Shelah Lamar RAMAN, MD;  Location: [facility] ENDOSCOPY;  Service: Pulmonary;;   BRONCHIAL BRUSHINGS  08/23/2021   Procedure: BRONCHIAL BRUSHINGS;   Surgeon: Shelah Lamar RAMAN, MD;  Location: [facility] ENDOSCOPY;  Service: Pulmonary;;   BRONCHIAL NEEDLE ASPIRATION BIOPSY  08/23/2021   Procedure: BRONCHIAL NEEDLE ASPIRATION BIOPSIES;  Surgeon: Shelah Lamar RAMAN, MD;  Location: [facility] ENDOSCOPY;  Service: Pulmonary;;   BRONCHIAL WASHINGS  08/23/2021   Procedure: BRONCHIAL WASHINGS;  Surgeon: Shelah Lamar RAMAN, MD;  Location: [facility] ENDOSCOPY;  Service: Pulmonary;;   CESAREAN SECTION     CHOLECYSTECTOMY  1985   COLONOSCOPY  2002   Dr. Barry: internal hemorrhoids, one small rectal polyp, adenomatous path    COLONOSCOPY N/A 11/23/2015   Procedure: COLONOSCOPY;  Surgeon: Regina LITTIE Haddock, MD;  Location: [facility] ENDO [facility];  Service: Endoscopy;  Laterality: N/A;  1100 - moved to 10:45 - office to notify   ESOPHAGOGASTRODUODENOSCOPY N/A 11/23/2015   Procedure: ESOPHAGOGASTRODUODENOSCOPY (EGD);  Surgeon: Regina LITTIE Haddock, MD;  Location: [facility];  Service: Endoscopy;  Laterality: N/A;   IR IMAGING GUIDED PORT INSERTION  11/08/2021   PORT-A-CATH REMOVAL  02/2015   PORTACATH PLACEMENT Right 09/17/2014   Procedure: INSERTION PORT-A-CATH WITH ULTRASOUND;  Surgeon: Debby Shipper, MD;  Location: [facility];  Service: General;  Laterality: Right;  IJ   RADIOACTIVE SEED GUIDED PARTIAL MASTECTOMY WITH AXILLARY SENTINEL LYMPH NODE BIOPSY Left 08/20/2014   Procedure: LEFT BREAST LUMPECTOMY WITH RADIOACTIVE SEED LOCALIZATION AND SENTINEL LYMPH NODE MAPPING;  Surgeon: Debby Shipper, MD;  Location: [facility];  Service: General;  Laterality: Left;   RE-EXCISION OF BREAST LUMPECTOMY Left 09/17/2014   Procedure: RE-EXCISION OF BREAST LUMPECTOMY;  Surgeon: Debby Shipper, MD;  Location:  SURGERY CENTER;  Service: General;  Laterality: Left;   TOTAL HIP ARTHROPLASTY Right 02/22/2021   Procedure: RIGHT TOTAL HIP ARTHROPLASTY ANTERIOR APPROACH;  Surgeon: Barbarann Oneil BROCKS, MD;  Location: [facility] OR;  Service: Orthopedics;  Laterality: Right;   TUBAL LIGATION     VIDEO  BRONCHOSCOPY WITH ENDOBRONCHIAL ULTRASOUND N/A 08/23/2021   Procedure: VIDEO BRONCHOSCOPY WITH ENDOBRONCHIAL ULTRASOUND;  Surgeon: Shelah Lamar RAMAN, MD;  Location: [facility] ENDOS[facility];  Service: Pulmonary;  Laterality: N/A;   VIDEO BRONCHOSCOPY WITH RADIAL ENDOBRONCHIAL ULTRASOUND  08/23/2021   Procedure: VIDEO BRONCHOSCOPY WITH RADIAL ENDOBRONCHIAL ULTRASOUND;  Surgeon: Shelah Lamar RAMAN, MD;  Location: [facility] ENDOSCOPY;  Service: Pulmonary;;    Social History: Social History   Socioeconomic History   Marital status: Divorced    Spouse name: Not on file   Number of children: 2   Years of education: Not on file   Highest education level: Not on file  Occupational History   Not on file  Tobacco Use   Smoking status: Former    Current packs/day: 0.00    Average packs/day: 1 pack/day for 42.0 years (42.0 ttl pk-yrs)    Types: Cigarettes    Start date: 02/23/1979    Quit date: 02/22/2021    Years since quitting: 2.4   Smokeless tobacco: Never  Vaping Use   Vaping status: Never Used  Substance and Sexual Activity   Alcohol use: No    Alcohol/week: 0.0 standard drinks of alcohol   Drug use: No   Sexual activity: Not Currently    Birth control/protection: None  Other Topics Concern   Not on file  Social History Narrative   Not on file   Social Drivers of Health   Financial Resource Strain: Not on file  Food Insecurity: No Food Insecurity (05/29/2023)   Hunger Vital Sign    Worried About Running Out of Food in the Last Year: Never true    Ran Out of Food in the Last Year: Never true  Transportation Needs: No Transportation Needs (05/29/2023)   PRAPARE - Administrator, Civil Service (Medical): No    Lack of Transportation (Non-Medical): No  Physical Activity: Not on file  Stress: Not on file  Social Connections: Unknown (08/05/2021)   Social Connection and Isolation Panel [NHANES]    Frequency of Communication with Friends and Family: More than three times a week    Frequency of  Social Gatherings with Friends and Family: More than three times a week    Attends Religious Services: Not on file    Active Member of Clubs or Organizations: Not on file    Attends Banker Meetings: Not on file    Marital Status: Not on file  Intimate Partner Violence: Not At Risk (05/29/2023)   Humiliation, Afraid, Rape, and Kick questionnaire    Fear of Current or Ex-Partner: No    Emotionally Abused: No    Physically Abused: No    Sexually Abused: No    Family History: Family History  Problem Relation Age of Onset   Diabetes Mother    Ovarian cancer Mother 37   Other Father        died in MVA at age 41   Other Sister        mitral valve disorder   Melanoma Sister 72       removed from back of leg   Colon cancer Brother        early 45s, succumbed to disease   Cancer Paternal Aunt        leukemia, bone, or lung cancer   Alzheimer's disease Maternal Grandmother    Heart attack Maternal Grandfather    Heart attack Paternal Grandmother    COPD Paternal Grandfather    Emphysema Paternal Grandfather    Congestive Heart Failure Paternal Aunt    Stomach cancer Paternal Uncle    Kidney cancer Maternal Uncle    Cancer Cousin        dx. teens/dx. 40s   Cancer Cousin  Colon cancer Cousin     Current Medications:  Current Outpatient Medications:    albuterol  (VENTOLIN  HFA) 108 (90 Base) MCG/ACT inhaler, Inhale 2 puffs into the lungs every 4 (four) hours as needed for shortness of breath., Disp: , Rfl:    ALPRAZolam  (XANAX ) 0.5 MG tablet, Take 0.5 mg by mouth 3 (three) times daily., Disp: , Rfl:    ANORO ELLIPTA  62.5-25 MCG/INH AEPB, Inhale 1 puff into the lungs daily., Disp: , Rfl:    calcium  carbonate (TUMS - DOSED IN MG ELEMENTAL CALCIUM ) 500 MG chewable tablet, Chew 2 tablets by mouth 3 (three) times daily as needed for indigestion or heartburn., Disp: , Rfl:    furosemide  (LASIX ) 20 MG tablet, Take 20 mg by mouth daily., Disp: , Rfl:    hydrochlorothiazide   (HYDRODIURIL ) 12.5 MG tablet, Take 12.5 mg by mouth daily., Disp: , Rfl:    HYDROcodone -acetaminophen  (HYCET) 7.5-325 mg/15 ml solution, Take 15 mLs by mouth every 6 (six) hours as needed for moderate pain (pain score 4-6)., Disp: 473 mL, Rfl: 0   ibuprofen  (ADVIL ) 100 MG/5ML suspension, Take 10 mLs (200 mg total) by mouth every 4 (four) hours as needed for moderate pain., Disp: 473 mL, Rfl: 0   levothyroxine  (SYNTHROID ) 100 MCG tablet, TAKE 1 TABLET BY MOUTH ONCE DAILY BEFORE BREAKFAST, Disp: 90 tablet, Rfl: 0   lidocaine  (XYLOCAINE ) 2 % solution, Patient: Mix 1part 2% viscous lidocaine , 1part H20. Swallow 10mL of diluted mixture, before meals and at bedtime, up to QID for sore throat., Disp: 200 mL, Rfl: 3   lidocaine -prilocaine  (EMLA ) cream, APPLY CREAM TOPICALLY AS NEEDED, Disp: 30 g, Rfl: 3   Menthol , Topical Analgesic, (BIOFREEZE EX), Apply 1 application topically daily as needed (pain)., Disp: , Rfl:    pantoprazole  (PROTONIX ) 40 MG tablet, Take 1 tablet by mouth once daily, Disp: 90 tablet, Rfl: 0   polyethylene glycol (MIRALAX  / GLYCOLAX ) 17 g packet, Take 17 g by mouth daily as needed for mild constipation., Disp: , Rfl:    sertraline  (ZOLOFT ) 100 MG tablet, Take 100 mg by mouth daily., Disp: , Rfl:    Allergies: No Known Allergies  REVIEW OF SYSTEMS:   Review of Systems  Constitutional:  Negative for chills, fatigue and fever.  HENT:   Positive for trouble swallowing (occasional). Negative for lump/mass, mouth sores, nosebleeds and sore throat.        +epistaxis  Eyes:  Negative for eye problems.  Respiratory:  Positive for cough (with bloody sputum), hemoptysis, shortness of breath and wheezing.   Cardiovascular:  Positive for chest pain (6-7/10 severity). Negative for leg swelling and palpitations.  Gastrointestinal:  Negative for abdominal pain, constipation, diarrhea, nausea and vomiting.  Genitourinary:  Negative for bladder incontinence, difficulty urinating, dysuria,  frequency, hematuria and nocturia.   Musculoskeletal:  Negative for arthralgias, back pain, flank pain, myalgias and neck pain.  Skin:  Positive for itching (on back). Negative for rash.  Neurological:  Positive for headaches. Negative for dizziness and numbness.  Hematological:  Does not bruise/bleed easily.  Psychiatric/Behavioral:  Positive for depression and sleep disturbance. Negative for suicidal ideas. The patient is nervous/anxious.   All other systems reviewed and are negative.    VITALS:   Blood pressure 121/77, pulse 90, temperature 97.7 F (36.5 C), temperature source Oral, resp. rate (!) 21, SpO2 95%.  Wt Readings from Last 3 Encounters:  06/29/23 209 lb 3.2 oz (94.9 kg)  06/01/23 205 lb 12.8 oz (93.4 kg)  05/30/23 205 lb  7.5 oz (93.2 kg)    There is no height or weight on file to calculate BMI.  Performance status (ECOG): 1 - Symptomatic but completely ambulatory  PHYSICAL EXAM:   Physical Exam Vitals and nursing note reviewed. Exam conducted with a chaperone present.  Constitutional:      Appearance: Normal appearance.  Cardiovascular:     Rate and Rhythm: Normal rate and regular rhythm.     Pulses: Normal pulses.     Heart sounds: Normal heart sounds.  Pulmonary:     Effort: Pulmonary effort is normal.     Breath sounds: Normal breath sounds.  Abdominal:     Palpations: Abdomen is soft. There is no hepatomegaly, splenomegaly or mass.     Tenderness: There is no abdominal tenderness.  Musculoskeletal:     Right lower leg: No edema.     Left lower leg: No edema.  Lymphadenopathy:     Cervical: No cervical adenopathy.     Right cervical: No superficial, deep or posterior cervical adenopathy.    Left cervical: No superficial, deep or posterior cervical adenopathy.     Upper Body:     Right upper body: No supraclavicular or axillary adenopathy.     Left upper body: No supraclavicular or axillary adenopathy.  Neurological:     General: No focal deficit  present.     Mental Status: She is alert and oriented to person, place, and time.  Psychiatric:        Mood and Affect: Mood normal.        Behavior: Behavior normal.     LABS:      Latest Ref Rng & Units 05/30/2023    4:08 AM 05/29/2023   12:32 AM 05/01/2023   11:45 AM  CBC  WBC 4.0 - 10.5 K/uL 12.8  11.8  6.2   Hemoglobin 12.0 - 15.0 g/dL 85.8  84.9  84.7   Hematocrit 36.0 - 46.0 % 42.0  43.8  44.3   Platelets 150 - 400 K/uL 203  183  223       Latest Ref Rng & Units 07/25/2023    9:58 AM 05/30/2023    4:08 AM 05/29/2023   12:32 AM  CMP  Glucose 70 - 99 mg/dL  811  844   BUN 8 - 23 mg/dL  25  15   Creatinine 9.55 - 1.00 mg/dL  8.99  9.06   Sodium 864 - 145 mmol/L  135  136   Potassium 3.5 - 5.1 mmol/L 4.1  3.7  3.9   Chloride 98 - 111 mmol/L  101  101   CO2 22 - 32 mmol/L  25  27   Calcium  8.9 - 10.3 mg/dL  9.2  9.4   Total Protein 6.5 - 8.1 g/dL  6.9  7.4   Total Bilirubin <1.2 mg/dL  1.8  2.5   Alkaline Phos 38 - 126 U/L  61  67   AST 15 - 41 U/L  14  17   ALT 0 - 44 U/L  12  13      Lab Results  Component Value Date   CEA1 4.53 11/01/2021   /  CEA ([facility])  Date Value Ref Range Status  11/01/2021 4.53 0.00 - 5.00 ng/mL Final    Comment:    (NOTE) This test was performed using [employer]'s Chemiluminescent Microparticle Immunoassay. Values obtained from different assay methods cannot be used interchangeably. Please note that 5-10% of patients who smoke may see CEA levels  up to 6.9 ng/mL. Performed at [facility], [address], [place], [place] [address]    No results found for: PSA1 No results found for: CAN199 Lab Results  Component Value Date   CAN125 18.9 08/10/2021    No results found for: TOTALPROTELP, ALBUMINELP, A1GS, A2GS, BETS, BETA2SER, GAMS, MSPIKE, SPEI No results found for: TIBC, FERRITIN, IRONPCTSAT No results found for: LDH   STUDIES:   No results found.

## 2023-07-25 NOTE — Patient Instructions (Addendum)
 [facility] at [facility] Discharge Instructions   You were seen and examined today by Dr. Rogers.  We will refer you to Dr. Darlean. He is a pulmonologist (lung specialist) to be evaluated for your increasing shortness of breath.   You have decided not to pursue treatment, which is a decision Dr. MARLA fully supports. We will make a referral to palliative care. They will help manage any side effects you may experience.          Thank you for choosing [facility] at [facility] to provide your oncology and hematology care.  To afford each patient quality time with our provider, please arrive at least 15 minutes before your scheduled appointment time.   If you have a lab appointment with the Cancer Center please come in thru the Main Entrance and check in at the main information desk.  You need to re-schedule your appointment should you arrive 10 or more minutes late.  We strive to give you quality time with our providers, and arriving late affects you and other patients whose appointments are after yours.  Also, if you no show three or more times for appointments you may be dismissed from the clinic at the providers discretion.     Again, thank you for choosing [facility].  Our hope is that these requests will decrease the amount of time that you wait before being seen by our physicians.       _____________________________________________________________  Should you have questions after your visit to [facility], please contact our office at [phone] and follow the prompts.  Our office hours are 8:00 a.m. and 4:30 p.m. Monday - Friday.  Please note that voicemails left after 4:00 p.m. may not be returned until the following business day.  We are closed weekends and major holidays.  You do have access to a nurse 24-7, just call the main number to the clinic [phone] and do not press any options, hold on the line  and a nurse will answer the phone.    For prescription refill requests, have your pharmacy contact our office and allow 72 hours.    Due to Covid, you will need to wear a mask upon entering the hospital. If you do not have a mask, a mask will be given to you at the Main Entrance upon arrival. For doctor visits, patients may have 1 support person age 70 or older with them. For treatment visits, patients can not have anyone with them due to social distancing guidelines and our immunocompromised population.

## 2023-07-27 ENCOUNTER — Telehealth: Payer: Self-pay | Admitting: *Deleted

## 2023-07-27 ENCOUNTER — Other Ambulatory Visit: Payer: Self-pay | Admitting: Hematology

## 2023-07-27 NOTE — Telephone Encounter (Signed)
 Patient called describing her port site as red, swollen and painful that radiates down her arm that began this morning.  Per Dr. Rogers, advised to contact Dr. Debby Shipper, who placed device to request removal.  Patient denies fever.  Recommended that if she develops a fever to see PCP for evaluation in the event she cannot see surgeon in a timely manner.  Verbalized understanding.

## 2023-07-27 NOTE — Telephone Encounter (Signed)
-----   Message from Doreatha Massed sent at 07/25/2023  4:46 PM EST ----- Please call her and let her know to start back on Synthroid 100 mcg daily as TSH elevated at 28.  She does not need to start back on potassium as it is normal.  Thanks.

## 2023-07-27 NOTE — Telephone Encounter (Signed)
 Spoke to sister [name] to make her aware to start on Synthroid 100 mcg daily and not to continue potassium supplement.  Verbalized understanding.

## 2023-07-28 ENCOUNTER — Other Ambulatory Visit: Payer: Self-pay

## 2023-07-30 DIAGNOSIS — J449 Chronic obstructive pulmonary disease, unspecified: Secondary | ICD-10-CM | POA: Diagnosis not present

## 2023-07-31 ENCOUNTER — Encounter: Payer: Self-pay | Admitting: Hematology and Oncology

## 2023-07-31 ENCOUNTER — Encounter: Payer: Self-pay | Admitting: Hematology

## 2023-07-31 MED ORDER — HYDROCODONE-ACETAMINOPHEN 7.5-325 MG/15ML PO SOLN: 15.0000 mL | Freq: Four times a day (QID) | ORAL | 0 refills | Status: DC | PRN

## 2023-08-01 ENCOUNTER — Other Ambulatory Visit: Payer: Self-pay

## 2023-08-09 ENCOUNTER — Telehealth: Payer: Self-pay | Admitting: Internal Medicine

## 2023-08-09 NOTE — Telephone Encounter (Signed)
 Left message for patient to call and discuss rescheduling the Tuesday 09/05/23 2:00pm visit with Dr. Lajean Pike is not in the office----will also send My Chart message

## 2023-08-10 NOTE — Telephone Encounter (Signed)
Spoke with patient regarding the 09/05/23 rescheduled appointment for Dr. Zada Girt out of office--scheduled Monday 08/14/23 at 3:00 pm in [place], [place] [address]---patient given new date, time and location.  She voiced her understanding.

## 2023-08-13 NOTE — Progress Notes (Deleted)
Regina Mcmahon, female    DOB: [birth year],     MRN: [id number]   Brief patient profile:  73   yowf active smoker on Tamoxifen since 2016 for Breast Ca needs clearance for R hip surgery referred to pulmonary clinic in [place]  10/12/2020 by Dr  Ophelia Charter.  Had seen Dr Juanetta Gosling with dx of copd on anoro chronically but no pfts on file    History of Present Illness  10/12/2020  Pulmonary/ 1st office eval/ Regina Mcmahon / Regina Mcmahon Office / anoro  Chief Complaint  Patient presents with   Pulmonary Consult    Former Dr Adah Perl pt. She needs risk assessment done to have total right hip replacement. She states her breathing seems stable as long as she stays still. She can not walk to her mailbox due to SOB and has to take frequent breaks when doing housework due to SOB. She uses her rescue inhaler a few times per wk.   Dyspnea:  Limited by R Hip pain more so than breathing 50 ft is about the limit  Cough: some am congestion /clear  Sleep: on side  Bed is flat / one pillow  SABA use: not much  Rec Please consider stopping all smoking x 2 weeks pre-op, this is the most important aspect of your care. Consider stopping the Tamoxifen between now and surgery due to risk of clots  Continue Anoro but take it before you go to hospital for surgery and each am thereafter  Only use your albuterol as a rescue medication Try albuterol 15 min before an activity that you know would make you short of breath      01/11/2021  f/u ov/[place] office/Josely Moffat re: GOLD II copd still smoking Chief Complaint  Patient presents with   Follow-up    Breathing is unchanged since the last visit. She is using her albuterol inhaler 2 x daily on average.    Dyspnea:  50 ft ft more limited by R hip/ cane Cough: 2-3 hours of congestion each am / slt greenish Sleeping: on side, one pillow flat bed  SABA use: twice daily inhaler / not needing neb 02: none  Covid status: never vaccinated  Rec Zpak  Ideally quit smoking 2 weeks  prior to surgery  Please schedule a follow up visit in 6 months but call sooner if needed   Oncology eval  07/25/23 Assessment: 1. Stage III squamous cell cancer: - LLL lung mass FNA (08/23/2021): Malignant cells consistent with squamous cell carcinoma. - Lymph node 4R and 7: Malignant cells consistent with NSCLC. - Chemoradiation therapy with weekly carboplatin and paclitaxel from 11/01/2021, week for chemotherapy on 11/23/2021.  XRT completed on 12/16/2021. - She missed further doses of chemotherapy secondary to thrombocytopenia. - PET scan (01/27/2022): Interval response to therapy with decreasing size of mediastinal lymph nodes.  No new disease. - Consolidation durvalumab from 02/01/2022 through 02/14/2023 - PET scan (05/18/2023): Right supraclavicular lymph node, mediastinal and left hilar lymph nodes, right middle lobe subpleural nodule positive.  No evidence of metastatic disease in the abdomen or pelvis. - MRI of the brain: No metastatic disease. - Right supraclavicular lymph node biopsy (05/25/2023): Metastatic squamous cell carcinoma. - Guardant360: NF1, BRCA1 (0.08%) - NGS: PD-L1 (SP142) 10%.  TMB-high.  MS-stable.  T p53 pathogenic variant.  HER2 by IHC 0.   2. Social/family history: - Seen today with her Sister Regina Mcmahon.  Patient lives by herself.  Quit smoking on 02/22/2021.  Smoked 2 packs/day for 40 years. - Mother died  of ovarian cancer.  Brother died of colon cancer at age 39.  Sister had melanoma.  Paternal uncles had stomach cancer and lung cancer.  Paternal aunt had thyroid cancer.  Another paternal aunt had pancreatic cancer.   3.  Supraglottic squamous cell carcinoma (T1N1): - Definitive radiation from 08/16/2021 through 09/13/2021     Plan: Recurrent squamous cell lung cancer: - Today she came back to discuss options again. - I have discussed that since she progressed on immunotherapy within 3 months after stopping adjuvant Durvalumab, she will not likely respond to any further  immunotherapy.  We discussed chemotherapy options with single agent docetaxel with or without Cyramza. - We have also discussed best supportive care in the form of hospice. - She reported worsening of dyspnea on exertion.  Also reported some blood in the sputum but she is also having nosebleeds from using oxygen via nasal cannula.  She is currently using Anoro and albuterol.  We will make a referral to pulmonology. - We will make a referral to palliative care with transition to hospice down the line. - I have answered all her questions thoroughly.  Total time spent is 45 minutes.  2.  Supraglottic squamous cell carcinoma (T1N1): - PET scan on 05/18/2023 did not show any local recurrence.  3.  Throat pain and sternal chest pain: - Continue hydrocodone liquid 15 mL every 6 hours as needed.  4.  Hypokalemia: - She quit taking potassium 3 months ago.  Potassium today was 4.1.  No need to start back on it.  5.  Hypothyroidism: - She quit taking Synthroid 100 mcg 3 months ago.  TSH today is 28.  We will call her to start back on it.   08/14/2023  f/u ov/[name] re: GOLD 2 COPD    maint on ***  No chief complaint on file.   Dyspnea:  *** Cough: *** Sleeping: *** resp cc  SABA use: *** 02: ***  Lung cancer screening :  ***    No obvious day to day or daytime variability or assoc excess/ purulent sputum or mucus plugs or hemoptysis or cp or chest tightness, subjective wheeze or overt sinus or hb symptoms.    Also denies any obvious fluctuation of symptoms with weather or environmental changes or other aggravating or alleviating factors except as outlined above   No unusual exposure hx or h/o childhood pna/ asthma or knowledge of premature birth.  Current Allergies, Complete Past Medical History, Past Surgical History, Family History, and Social History were reviewed in [employer] record.  ROS  The following are not active complaints unless bolded Hoarseness, sore  throat, dysphagia, dental problems, itching, sneezing,  nasal congestion or discharge of excess mucus or purulent secretions, ear ache,   fever, chills, sweats, unintended wt loss or wt gain, classically pleuritic or exertional cp,  orthopnea pnd or arm/hand swelling  or leg swelling, presyncope, palpitations, abdominal pain, anorexia, nausea, vomiting, diarrhea  or change in bowel habits or change in bladder habits, change in stools or change in urine, dysuria, hematuria,  rash, arthralgias, visual complaints, headache, numbness, weakness or ataxia or problems with walking or coordination,  change in mood or  memory.        No outpatient medications have been marked as taking for the 08/14/23 encounter (Appointment) with Nyoka Cowden, MD.                      Past Medical History:  Diagnosis Date  Asthma    Asymptomatic varicose veins    Breast cancer (HCC) 06/2014   left   Chronic airway obstruction (HCC)    COPD (chronic obstructive pulmonary disease) (HCC)    Depression    Hemorrhage rectum    and anus.   Hypertension    Insomnia    Other symptoms involving digestive system(787.99)    Pain    Hx.pain in joint involving pelvic region and thigh; pain in limb   Palpitations    Panic attacks    Personal history of chemotherapy 2016   Personal history of radiation therapy 2016   Sinusitis    Symptomatic menopausal or female climacteric states        Objective:    wts   08/14/2023       ***   01/11/21 220 lb (99.8 kg)  10/12/20 227 lb (103 kg)  09/10/20 230 lb (104.3 kg)    Vital signs reviewed  08/14/2023  - Note at rest 02 sats  ***% on ***   General appearance:    ***  Mod barr***                Assessment

## 2023-08-14 ENCOUNTER — Other Ambulatory Visit: Payer: Self-pay | Admitting: *Deleted

## 2023-08-14 ENCOUNTER — Institutional Professional Consult (permissible substitution): Payer: 59 | Admitting: Internal Medicine

## 2023-08-14 MED ORDER — HYDROCODONE-ACETAMINOPHEN 7.5-325 MG/15ML PO SOLN: 15.0000 mL | Freq: Four times a day (QID) | ORAL | 0 refills | Status: DC | PRN

## 2023-08-28 ENCOUNTER — Other Ambulatory Visit: Payer: Self-pay | Admitting: *Deleted

## 2023-08-28 MED ORDER — HYDROCODONE-ACETAMINOPHEN 7.5-325 MG/15ML PO SOLN: 15.0000 mL | Freq: Four times a day (QID) | ORAL | 0 refills | Status: DC | PRN

## 2023-09-05 ENCOUNTER — Institutional Professional Consult (permissible substitution): Payer: 59 | Admitting: Internal Medicine

## 2023-09-11 ENCOUNTER — Other Ambulatory Visit: Payer: Self-pay | Admitting: *Deleted

## 2023-09-12 ENCOUNTER — Encounter: Payer: Self-pay | Admitting: Hematology

## 2023-09-12 ENCOUNTER — Encounter: Payer: Self-pay | Admitting: Hematology and Oncology

## 2023-09-12 MED ORDER — HYDROCODONE-ACETAMINOPHEN 7.5-325 MG/15ML PO SOLN: 15.0000 mL | Freq: Four times a day (QID) | ORAL | 0 refills | Status: DC | PRN

## 2023-09-20 ENCOUNTER — Encounter: Payer: Self-pay | Admitting: Hematology

## 2023-09-20 ENCOUNTER — Emergency Department (HOSPITAL_COMMUNITY)

## 2023-09-20 ENCOUNTER — Inpatient Hospital Stay (HOSPITAL_COMMUNITY)
Admission: EM | Admit: 2023-09-20 | Discharge: 2023-09-21 | DRG: 189 | Disposition: A | Discharge status: Hospice | Attending: Family Medicine | Admitting: Family Medicine

## 2023-09-20 ENCOUNTER — Other Ambulatory Visit: Payer: Self-pay

## 2023-09-20 ENCOUNTER — Encounter: Payer: Self-pay | Admitting: Hematology and Oncology

## 2023-09-20 ENCOUNTER — Encounter (HOSPITAL_COMMUNITY): Payer: Self-pay | Admitting: Emergency Medicine

## 2023-09-20 DIAGNOSIS — Z808 Family history of malignant neoplasm of other organs or systems: Secondary | ICD-10-CM

## 2023-09-20 DIAGNOSIS — Z515 Encounter for palliative care: Secondary | ICD-10-CM | POA: Diagnosis not present

## 2023-09-20 DIAGNOSIS — Z923 Personal history of irradiation: Secondary | ICD-10-CM | POA: Diagnosis not present

## 2023-09-20 DIAGNOSIS — C3490 Malignant neoplasm of unspecified part of unspecified bronchus or lung: Secondary | ICD-10-CM | POA: Diagnosis present

## 2023-09-20 DIAGNOSIS — Z9981 Dependence on supplemental oxygen: Secondary | ICD-10-CM

## 2023-09-20 DIAGNOSIS — J440 Chronic obstructive pulmonary disease with acute lower respiratory infection: Secondary | ICD-10-CM | POA: Diagnosis present

## 2023-09-20 DIAGNOSIS — R9389 Abnormal findings on diagnostic imaging of other specified body structures: Secondary | ICD-10-CM | POA: Diagnosis present

## 2023-09-20 DIAGNOSIS — E039 Hypothyroidism, unspecified: Secondary | ICD-10-CM | POA: Diagnosis present

## 2023-09-20 DIAGNOSIS — Z800 Family history of malignant neoplasm of digestive organs: Secondary | ICD-10-CM

## 2023-09-20 DIAGNOSIS — Z801 Family history of malignant neoplasm of trachea, bronchus and lung: Secondary | ICD-10-CM

## 2023-09-20 DIAGNOSIS — Z96641 Presence of right artificial hip joint: Secondary | ICD-10-CM | POA: Diagnosis present

## 2023-09-20 DIAGNOSIS — Z79891 Long term (current) use of opiate analgesic: Secondary | ICD-10-CM | POA: Diagnosis not present

## 2023-09-20 DIAGNOSIS — J189 Pneumonia, unspecified organism: Secondary | ICD-10-CM | POA: Diagnosis present

## 2023-09-20 DIAGNOSIS — R0689 Other abnormalities of breathing: Secondary | ICD-10-CM | POA: Diagnosis not present

## 2023-09-20 DIAGNOSIS — C50412 Malignant neoplasm of upper-outer quadrant of left female breast: Secondary | ICD-10-CM | POA: Diagnosis present

## 2023-09-20 DIAGNOSIS — Z806 Family history of leukemia: Secondary | ICD-10-CM

## 2023-09-20 DIAGNOSIS — Z79899 Other long term (current) drug therapy: Secondary | ICD-10-CM

## 2023-09-20 DIAGNOSIS — Z8041 Family history of malignant neoplasm of ovary: Secondary | ICD-10-CM

## 2023-09-20 DIAGNOSIS — Z820 Family history of epilepsy and other diseases of the nervous system: Secondary | ICD-10-CM

## 2023-09-20 DIAGNOSIS — Z66 Do not resuscitate: Secondary | ICD-10-CM | POA: Diagnosis present

## 2023-09-20 DIAGNOSIS — Z8249 Family history of ischemic heart disease and other diseases of the circulatory system: Secondary | ICD-10-CM

## 2023-09-20 DIAGNOSIS — Z833 Family history of diabetes mellitus: Secondary | ICD-10-CM

## 2023-09-20 DIAGNOSIS — Z9221 Personal history of antineoplastic chemotherapy: Secondary | ICD-10-CM

## 2023-09-20 DIAGNOSIS — J441 Chronic obstructive pulmonary disease with (acute) exacerbation: Secondary | ICD-10-CM | POA: Diagnosis present

## 2023-09-20 DIAGNOSIS — Z79890 Hormone replacement therapy (postmenopausal): Secondary | ICD-10-CM | POA: Diagnosis not present

## 2023-09-20 DIAGNOSIS — Z8051 Family history of malignant neoplasm of kidney: Secondary | ICD-10-CM

## 2023-09-20 DIAGNOSIS — I10 Essential (primary) hypertension: Secondary | ICD-10-CM | POA: Diagnosis present

## 2023-09-20 DIAGNOSIS — Z1152 Encounter for screening for COVID-19: Secondary | ICD-10-CM

## 2023-09-20 DIAGNOSIS — J9621 Acute and chronic respiratory failure with hypoxia: Secondary | ICD-10-CM | POA: Diagnosis present

## 2023-09-20 DIAGNOSIS — Z743 Need for continuous supervision: Secondary | ICD-10-CM | POA: Diagnosis not present

## 2023-09-20 DIAGNOSIS — R0603 Acute respiratory distress: Secondary | ICD-10-CM | POA: Diagnosis present

## 2023-09-20 DIAGNOSIS — Z853 Personal history of malignant neoplasm of breast: Secondary | ICD-10-CM | POA: Diagnosis not present

## 2023-09-20 DIAGNOSIS — R6889 Other general symptoms and signs: Secondary | ICD-10-CM | POA: Diagnosis not present

## 2023-09-20 DIAGNOSIS — Z825 Family history of asthma and other chronic lower respiratory diseases: Secondary | ICD-10-CM

## 2023-09-20 DIAGNOSIS — R0902 Hypoxemia: Secondary | ICD-10-CM

## 2023-09-20 DIAGNOSIS — Z7951 Long term (current) use of inhaled steroids: Secondary | ICD-10-CM | POA: Diagnosis not present

## 2023-09-20 DIAGNOSIS — Z87891 Personal history of nicotine dependence: Secondary | ICD-10-CM

## 2023-09-20 DIAGNOSIS — R062 Wheezing: Secondary | ICD-10-CM | POA: Diagnosis not present

## 2023-09-20 LAB — CBC
HCT: 48.1 % — ABNORMAL HIGH (ref 36.0–46.0)
HCT: 47.8 % — ABNORMAL HIGH (ref 36.0–46.0)
Hemoglobin: 15.2 g/dL — ABNORMAL HIGH (ref 12.0–15.0)
Hemoglobin: 14.8 g/dL (ref 12.0–15.0)
MCH: 29 pg (ref 26.0–34.0)
MCH: 28.6 pg (ref 26.0–34.0)
MCHC: 31.6 g/dL (ref 30.0–36.0)
MCHC: 31 g/dL (ref 30.0–36.0)
MCV: 91.6 fL (ref 80.0–100.0)
MCV: 92.3 fL (ref 80.0–100.0)
Platelets: 233 10*3/uL (ref 150–400)
Platelets: 221 10*3/uL (ref 150–400)
RBC: 5.25 MIL/uL — ABNORMAL HIGH (ref 3.87–5.11)
RBC: 5.18 MIL/uL — ABNORMAL HIGH (ref 3.87–5.11)
RDW: 12.5 % (ref 11.5–15.5)
RDW: 12.3 % (ref 11.5–15.5)
WBC: 8.8 10*3/uL (ref 4.0–10.5)
WBC: 7.1 10*3/uL (ref 4.0–10.5)
nRBC: 0 % (ref 0.0–0.2)
nRBC: 0 % (ref 0.0–0.2)

## 2023-09-20 LAB — RESP PANEL BY RT-PCR (RSV, FLU A&B, COVID)  RVPGX2
Influenza A by PCR: NEGATIVE
Influenza B by PCR: NEGATIVE
Resp Syncytial Virus by PCR: NEGATIVE
SARS Coronavirus 2 by RT PCR: NEGATIVE

## 2023-09-20 LAB — COMPREHENSIVE METABOLIC PANEL
ALT: 13 U/L (ref 0–44)
AST: 16 U/L (ref 15–41)
Albumin: 3.5 g/dL (ref 3.5–5.0)
Alkaline Phosphatase: 64 U/L (ref 38–126)
Anion gap: 6 (ref 5–15)
BUN: 11 mg/dL (ref 8–23)
CO2: 34 mmol/L — ABNORMAL HIGH (ref 22–32)
Calcium: 9.5 mg/dL (ref 8.9–10.3)
Chloride: 100 mmol/L (ref 98–111)
Creatinine, Ser: 0.81 mg/dL (ref 0.44–1.00)
GFR, Estimated: >60 mL/min (ref >60)
Glucose, Bld: 123 mg/dL — ABNORMAL HIGH (ref 70–99)
Potassium: 5 mmol/L (ref 3.5–5.1)
Sodium: 140 mmol/L (ref 135–145)
Total Bilirubin: 1.4 mg/dL — ABNORMAL HIGH (ref 0.0–1.2)
Total Protein: 7.3 g/dL (ref 6.5–8.1)

## 2023-09-20 LAB — CREATININE, SERUM
Creatinine, Ser: 0.75 mg/dL (ref 0.44–1.00)
GFR, Estimated: >60 mL/min (ref >60)

## 2023-09-20 LAB — PROCALCITONIN: Procalcitonin: 0.13 ng/mL

## 2023-09-20 LAB — GLUCOSE, CAPILLARY: Glucose-Capillary: 90 mg/dL (ref 70–99)

## 2023-09-20 LAB — MRSA NEXT GEN BY PCR, NASAL: MRSA by PCR Next Gen: NOT DETECTED

## 2023-09-20 MED ORDER — DEXTROMETHORPHAN POLISTIREX ER 30 MG/5ML PO SUER: 30.0000 mg | Freq: Two times a day (BID) | ORAL | Status: DC | PRN

## 2023-09-20 MED ORDER — FENTANYL 50 MCG/HR TD PT72
1.0000 | MEDICATED_PATCH | TRANSDERMAL | Status: DC
  Administered 2023-09-21: 1 via TRANSDERMAL
  Filled 2023-09-20: qty 1

## 2023-09-20 MED ORDER — SODIUM CHLORIDE 0.9 % IV SOLN
2.0000 g | Freq: Once | INTRAVENOUS | Status: AC
  Administered 2023-09-20: 2 g via INTRAVENOUS
  Filled 2023-09-20: qty 20

## 2023-09-20 MED ORDER — ACETAMINOPHEN 650 MG RE SUPP: 650.0000 mg | Freq: Four times a day (QID) | RECTAL | Status: DC | PRN

## 2023-09-20 MED ORDER — ACETAMINOPHEN 325 MG PO TABS: 650.0000 mg | ORAL_TABLET | Freq: Four times a day (QID) | ORAL | Status: DC | PRN

## 2023-09-20 MED ORDER — MORPHINE SULFATE (PF) 4 MG/ML IV SOLN
2.0000 mg | Freq: Once | INTRAVENOUS | Status: AC
  Administered 2023-09-20: 2 mg via INTRAVENOUS
  Filled 2023-09-20: qty 1

## 2023-09-20 MED ORDER — IBUPROFEN 100 MG/5ML PO SUSP: 200.0000 mg | ORAL | Status: DC | PRN

## 2023-09-20 MED ORDER — LEVOTHYROXINE SODIUM 100 MCG PO TABS
100.0000 ug | ORAL_TABLET | Freq: Every day | ORAL | Status: DC
  Administered 2023-09-21: 100 ug via ORAL
  Filled 2023-09-20: qty 1

## 2023-09-20 MED ORDER — ONDANSETRON HCL 4 MG PO TABS
4.0000 mg | ORAL_TABLET | Freq: Four times a day (QID) | ORAL | Status: DC | PRN
Start: 2023-09-20 — End: 2023-09-21

## 2023-09-20 MED ORDER — CHLORHEXIDINE GLUCONATE CLOTH 2 % EX PADS
6.0000 | MEDICATED_PAD | Freq: Every day | CUTANEOUS | Status: DC
  Administered 2023-09-20 – 2023-09-21 (×2): 6 via TOPICAL

## 2023-09-20 MED ORDER — GUAIFENESIN 100 MG/5ML PO LIQD
15.0000 mL | Freq: Four times a day (QID) | ORAL | Status: DC
  Administered 2023-09-20 – 2023-09-21 (×4): 15 mL via ORAL
  Filled 2023-09-20 (×4): qty 15

## 2023-09-20 MED ORDER — LACTATED RINGERS IV SOLN: INTRAVENOUS | Status: AC

## 2023-09-20 MED ORDER — MORPHINE SULFATE (CONCENTRATE) 10 MG /0.5 ML PO SOLN: 5.0000 mg | ORAL | Status: DC | PRN

## 2023-09-20 MED ORDER — ONDANSETRON HCL 4 MG/2ML IJ SOLN
4.0000 mg | Freq: Four times a day (QID) | INTRAMUSCULAR | Status: DC | PRN
  Administered 2023-09-21: 4 mg via INTRAVENOUS
  Filled 2023-09-20: qty 2

## 2023-09-20 MED ORDER — SERTRALINE HCL 50 MG PO TABS
100.0000 mg | ORAL_TABLET | Freq: Every day | ORAL | Status: DC
  Administered 2023-09-21: 100 mg via ORAL
  Filled 2023-09-20 (×2): qty 2

## 2023-09-20 MED ORDER — MORPHINE SULFATE (CONCENTRATE) 10 MG /0.5 ML PO SOLN
5.0000 mg | ORAL | Status: DC | PRN
  Administered 2023-09-20 – 2023-09-21 (×5): 5 mg via ORAL
  Filled 2023-09-20 (×5): qty 0.5

## 2023-09-20 MED ORDER — ALPRAZOLAM 0.5 MG PO TABS
0.5000 mg | ORAL_TABLET | Freq: Three times a day (TID) | ORAL | Status: DC | PRN
  Administered 2023-09-20 – 2023-09-21 (×4): 0.5 mg via ORAL
  Filled 2023-09-20 (×5): qty 1

## 2023-09-20 MED ORDER — ALPRAZOLAM 0.5 MG PO TABS: 0.5000 mg | ORAL_TABLET | Freq: Three times a day (TID) | ORAL | Status: DC

## 2023-09-20 MED ORDER — SODIUM CHLORIDE 0.9 % IV SOLN
500.0000 mg | INTRAVENOUS | Status: DC
  Administered 2023-09-20 – 2023-09-21 (×2): 500 mg via INTRAVENOUS
  Filled 2023-09-20 (×2): qty 5

## 2023-09-20 MED ORDER — SODIUM CHLORIDE 0.9 % IV SOLN
2.0000 g | INTRAVENOUS | Status: DC
  Administered 2023-09-21: 2 g via INTRAVENOUS
  Filled 2023-09-20: qty 20

## 2023-09-20 MED ORDER — ORAL CARE MOUTH RINSE: 15.0000 mL | OROMUCOSAL | Status: DC | PRN

## 2023-09-20 MED ORDER — FUROSEMIDE 40 MG PO TABS: 20.0000 mg | ORAL_TABLET | Freq: Every day | ORAL | Status: DC

## 2023-09-20 MED ORDER — ENOXAPARIN SODIUM 40 MG/0.4ML IJ SOSY
40.0000 mg | PREFILLED_SYRINGE | INTRAMUSCULAR | Status: DC
Start: 2023-09-20 — End: 2023-09-21
  Administered 2023-09-20: 40 mg via SUBCUTANEOUS
  Filled 2023-09-20: qty 0.4

## 2023-09-20 MED ORDER — HYDROCODONE-ACETAMINOPHEN 7.5-325 MG/15ML PO SOLN: 15.0000 mL | Freq: Four times a day (QID) | ORAL | Status: DC | PRN

## 2023-09-20 MED ORDER — PANTOPRAZOLE SODIUM 40 MG PO TBEC
40.0000 mg | DELAYED_RELEASE_TABLET | Freq: Every day | ORAL | Status: DC
  Administered 2023-09-21: 40 mg via ORAL
  Filled 2023-09-20 (×2): qty 1

## 2023-09-20 MED ORDER — ENSURE ENLIVE PO LIQD: 237.0000 mL | Freq: Two times a day (BID) | ORAL | Status: DC

## 2023-09-20 MED ORDER — GUAIFENESIN ER 600 MG PO TB12
1200.0000 mg | ORAL_TABLET | Freq: Two times a day (BID) | ORAL | Status: DC
  Filled 2023-09-20: qty 2

## 2023-09-20 MED ORDER — POLYETHYLENE GLYCOL 3350 17 G PO PACK: 17.0000 g | PACK | Freq: Every evening | ORAL | Status: DC

## 2023-09-20 MED ORDER — IPRATROPIUM-ALBUTEROL 0.5-2.5 (3) MG/3ML IN SOLN
3.0000 mL | Freq: Once | RESPIRATORY_TRACT | Status: AC
  Administered 2023-09-20: 3 mL via RESPIRATORY_TRACT
  Filled 2023-09-20: qty 3

## 2023-09-20 MED ORDER — CALCIUM CARBONATE ANTACID 500 MG PO CHEW: 2.0000 | CHEWABLE_TABLET | Freq: Three times a day (TID) | ORAL | Status: DC | PRN

## 2023-09-20 MED ORDER — IPRATROPIUM-ALBUTEROL 0.5-2.5 (3) MG/3ML IN SOLN
6.0000 mL | Freq: Once | RESPIRATORY_TRACT | Status: AC
  Administered 2023-09-20: 6 mL via RESPIRATORY_TRACT
  Filled 2023-09-20: qty 3

## 2023-09-20 MED ORDER — PREDNISONE 20 MG PO TABS
40.0000 mg | ORAL_TABLET | Freq: Every day | ORAL | Status: DC
  Administered 2023-09-21: 40 mg via ORAL
  Filled 2023-09-20: qty 2

## 2023-09-20 MED ORDER — TRAZODONE HCL 50 MG PO TABS
50.0000 mg | ORAL_TABLET | Freq: Every evening | ORAL | Status: DC | PRN
  Administered 2023-09-21: 50 mg via ORAL
  Filled 2023-09-20: qty 1

## 2023-09-20 MED ORDER — METHYLPREDNISOLONE SODIUM SUCC 125 MG IJ SOLR
125.0000 mg | Freq: Once | INTRAMUSCULAR | Status: AC
  Administered 2023-09-20: 125 mg via INTRAVENOUS
  Filled 2023-09-20: qty 2

## 2023-09-20 MED ORDER — IPRATROPIUM-ALBUTEROL 0.5-2.5 (3) MG/3ML IN SOLN
3.0000 mL | Freq: Four times a day (QID) | RESPIRATORY_TRACT | Status: DC
  Administered 2023-09-20 – 2023-09-21 (×4): 3 mL via RESPIRATORY_TRACT
  Filled 2023-09-20 (×4): qty 3

## 2023-09-20 NOTE — ED Notes (Signed)
 Patient removed from [employer] at this time to eat. She was comfortable on 4lpm [place] and her SPO2 is 95%-99%. Still wheezing but not in distress.

## 2023-09-20 NOTE — ED Notes (Signed)
 Wheeled pt to restroom on [facility]. Upon standing pt became very tachypneic and o2 dropped to low 80s. Pt oxygen bumped up to [facility]. EDP made aware

## 2023-09-20 NOTE — ED Notes (Signed)
 Pt appears to be more relaxed. Breathing has improved.

## 2023-09-20 NOTE — ED Provider Notes (Addendum)
 [facility] Emergency Department Provider Note MRN:  [id number]  Arrival date & time: 09/20/23     Chief Complaint   Fever   History of Present Illness   Regina Mcmahon is a 73 y.o. year-old female with a history of COPD, lung cancer presenting to the ED with chief complaint of fever.  Fever, body aches, malaise past 2 days, currently on hospice.  Review of Systems  A thorough review of systems was obtained and all systems are negative except as noted in the HPI and PMH.   Patient's Health History    Past Medical History:  Diagnosis Date   Arthritis    Asthma    Asymptomatic varicose veins    Breast cancer (HCC) 06/2014   left   Cancer (HCC)    Supra Glottis   Chronic airway obstruction (HCC)    Complication of anesthesia 2016   difficulty waking up, Oxygen dropped low.   COPD (chronic obstructive pulmonary disease) (HCC)    Depression    Dyspnea    GERD (gastroesophageal reflux disease)    Hemorrhage rectum    and anus.   Hypertension    Insomnia    Other symptoms involving digestive system(787.99)    Pain    Hx.pain in joint involving pelvic region and thigh; pain in limb   Palpitations    Panic attacks    Personal history of chemotherapy 2016   Personal history of radiation therapy 2016   Pneumonia    Pre-diabetes    Prediabetes    Sinusitis    Symptomatic menopausal or female climacteric states    Varicose veins of both lower extremities with pain     Past Surgical History:  Procedure Laterality Date   BREAST BIOPSY Left 10/2012   BREAST BIOPSY Left 07/01/2014   malignant   BREAST LUMPECTOMY Left 08/20/2014   BRONCHIAL BIOPSY  08/23/2021   Procedure: BRONCHIAL BIOPSIES;  Surgeon: Leslye Peer, MD;  Location: [facility] ENDOSCOPY;  Service: Pulmonary;;   BRONCHIAL BRUSHINGS  08/23/2021   Procedure: BRONCHIAL BRUSHINGS;  Surgeon: Leslye Peer, MD;  Location: [facility] ENDOSCOPY;  Service: Pulmonary;;   BRONCHIAL NEEDLE ASPIRATION BIOPSY   08/23/2021   Procedure: BRONCHIAL NEEDLE ASPIRATION BIOPSIES;  Surgeon: Leslye Peer, MD;  Location: [facility] ENDOSCOPY;  Service: Pulmonary;;   BRONCHIAL WASHINGS  08/23/2021   Procedure: BRONCHIAL WASHINGS;  Surgeon: Leslye Peer, MD;  Location: [facility] ENDOSCOPY;  Service: Pulmonary;;   CESAREAN SECTION     CHOLECYSTECTOMY  1985   COLONOSCOPY  2002   Dr. Cleotis Nipper: internal hemorrhoids, one small rectal polyp, adenomatous path    COLONOSCOPY N/A 11/23/2015   Procedure: COLONOSCOPY;  Surgeon: West Bali, MD;  Location: [facility] ENDO SUITE;  Service: Endoscopy;  Laterality: N/A;  1100 - moved to 10:45 - office to notify   ESOPHAGOGASTRODUODENOSCOPY N/A 11/23/2015   Procedure: ESOPHAGOGASTRODUODENOSCOPY (EGD);  Surgeon: West Bali, MD;  Location: [facility];  Service: Endoscopy;  Laterality: N/A;   IR IMAGING GUIDED PORT INSERTION  11/08/2021   PORT-A-CATH REMOVAL  02/2015   PORTACATH PLACEMENT Right 09/17/2014   Procedure: INSERTION PORT-A-CATH WITH ULTRASOUND;  Surgeon: Harriette Bouillon, MD;  Location: [facility];  Service: General;  Laterality: Right;  IJ   RADIOACTIVE SEED GUIDED PARTIAL MASTECTOMY WITH AXILLARY SENTINEL LYMPH NODE BIOPSY Left 08/20/2014   Procedure: LEFT BREAST LUMPECTOMY WITH RADIOACTIVE SEED LOCALIZATION AND SENTINEL LYMPH NODE MAPPING;  Surgeon: Harriette Bouillon, MD;  Location: [facility];  Service: General;  Laterality: Left;   RE-EXCISION OF BREAST LUMPECTOMY Left 09/17/2014   Procedure: RE-EXCISION OF BREAST LUMPECTOMY;  Surgeon: Harriette Bouillon, MD;  Location: [facility];  Service: General;  Laterality: Left;   TOTAL HIP ARTHROPLASTY Right 02/22/2021   Procedure: RIGHT TOTAL HIP ARTHROPLASTY ANTERIOR APPROACH;  Surgeon: Eldred Manges, MD;  Location: [facility] OR;  Service: Orthopedics;  Laterality: Right;   TUBAL LIGATION     VIDEO BRONCHOSCOPY WITH ENDOBRONCHIAL ULTRASOUND N/A 08/23/2021   Procedure: VIDEO BRONCHOSCOPY WITH ENDOBRONCHIAL  ULTRASOUND;  Surgeon: Leslye Peer, MD;  Location: [facility] ENDOS[facility];  Service: Pulmonary;  Laterality: N/A;   VIDEO BRONCHOSCOPY WITH RADIAL ENDOBRONCHIAL ULTRASOUND  08/23/2021   Procedure: VIDEO BRONCHOSCOPY WITH RADIAL ENDOBRONCHIAL ULTRASOUND;  Surgeon: Leslye Peer, MD;  Location: [facility];  Service: Pulmonary;;    Family History  Problem Relation Age of Onset   Diabetes Mother    Ovarian cancer Mother 57   Other Father        died in MVA at age 18   Other Sister        mitral valve disorder   Melanoma Sister 37       removed from back of leg   Colon cancer Brother        early 21s, succumbed to disease   Cancer Paternal Aunt        leukemia, bone, or lung cancer   Alzheimer's disease Maternal Grandmother    Heart attack Maternal Grandfather    Heart attack Paternal Grandmother    COPD Paternal Grandfather    Emphysema Paternal Grandfather    Congestive Heart Failure Paternal Aunt    Stomach cancer Paternal Uncle    Kidney cancer Maternal Uncle    Cancer Cousin        dx. teens/dx. 40s   Cancer Cousin    Colon cancer Cousin     Social History   Socioeconomic History   Marital status: Divorced    Spouse name: Not on file   Number of children: 2   Years of education: Not on file   Highest education level: Not on file  Occupational History   Not on file  Tobacco Use   Smoking status: Former    Current packs/day: 0.00    Average packs/day: 1 pack/day for 42.0 years (42.0 ttl pk-yrs)    Types: Cigarettes    Start date: 02/23/1979    Quit date: 02/22/2021    Years since quitting: 2.5   Smokeless tobacco: Never  Vaping Use   Vaping status: Never Used  Substance and Sexual Activity   Alcohol use: No    Alcohol/week: 0.0 standard drinks of alcohol   Drug use: No   Sexual activity: Not Currently    Birth control/protection: None  Other Topics Concern   Not on file  Social History Narrative   Not on file   Social Drivers of Health   Financial Resource  Strain: Not on file  Food Insecurity: No Food Insecurity (05/29/2023)   Hunger Vital Sign    Worried About Running Out of Food in the Last Year: Never true    Ran Out of Food in the Last Year: Never true  Transportation Needs: No Transportation Needs (05/29/2023)   PRAPARE - Administrator, Civil Service (Medical): No    Lack of Transportation (Non-Medical): No  Physical Activity: Not on file  Stress: Not on file  Social Connections: Unknown (08/05/2021)   Social Connection and Isolation  Panel [NHANES]    Frequency of Communication with Friends and Family: More than three times a week    Frequency of Social Gatherings with Friends and Family: More than three times a week    Attends Religious Services: Not on file    Active Member of Clubs or Organizations: Not on file    Attends Banker Meetings: Not on file    Marital Status: Not on file  Intimate Partner Violence: Not At Risk (05/29/2023)   Humiliation, Afraid, Rape, and Kick questionnaire    Fear of Current or Ex-Partner: No    Emotionally Abused: No    Physically Abused: No    Sexually Abused: No     Physical Exam   Vitals:   09/20/23 0324 09/20/23 0639  BP:    Pulse:  96  Resp:  (!) 23  Temp:    SpO2: 95% 96%    CONSTITUTIONAL: Well-appearing, NAD NEURO/PSYCH:  Alert and oriented x 3, no focal deficits EYES:  eyes equal and reactive ENT/NECK:  no LAD, no JVD CARDIO: Regular rate, well-perfused, normal S1 and S2 PULM:  CTAB no wheezing or rhonchi GI/GU:  non-distended, non-tender MSK/SPINE:  No gross deformities, no edema SKIN:  no rash, atraumatic   *Additional and/or pertinent findings included in MDM below  Diagnostic and Interventional Summary    EKG Interpretation Date/Time:    Ventricular Rate:    PR Interval:    QRS Duration:    QT Interval:    QTC Calculation:   R Axis:      Text Interpretation:         Labs Reviewed  RESP PANEL BY RT-PCR (RSV, FLU A&B, COVID)  [id number]   CBC  COMPREHENSIVE METABOLIC PANEL    CT Chest Wo Contrast  Final Result    DG Chest Port 1 View  Final Result      Medications  ipratropium-albuterol (DUONEB) 0.5-2.5 (3) MG/3ML nebulizer solution 3 mL (3 mLs Nebulization Given 09/20/23 0423)     Procedures  /  Critical Care .Critical Care  Performed by: Sabas Sous, MD Authorized by: Sabas Sous, MD   Critical care provider statement:    Critical care time (minutes):  32   Critical care was necessary to treat or prevent imminent or life-threatening deterioration of the following conditions:  Respiratory failure   Critical care was time spent personally by me on the following activities:  Development of treatment plan with patient or surrogate, discussions with consultants, evaluation of patient's response to treatment, examination of patient, ordering and review of laboratory studies, ordering and review of radiographic studies, ordering and performing treatments and interventions, pulse oximetry, re-evaluation of patient's condition and review of old charts   ED Course and Medical Decision Making  Initial Impression and Ddx Patient is new to hospitalist, receiving home hospice only for the past month or 2.  She does not seem quite accepting of her terminal illness as of yet.  I had an extended discussion regarding goals of care with patient, patient's sister, patient's daughter and patient's son who is in [place] but he was on the phone with [place].  Patient had a physical DNR but recently changed her mind and tore it up.  And so that decision is still in flux.  Patient is definitely not comfort care at this time.  Wants diagnostics, wants to be treated for reversible conditions.  Currently she seems to be doing well, she is currently not short of breath, feels a  lot better after taking her home morphine.  She was not really having chest pain or shortness of breath, just malaise fatigue and bodyaches, suspicious for flu.  With the  report of possible fever we will do an x-ray to evaluate for pneumonia.  Currently not really any indication for laboratory evaluation, doubt need for admission.  Past medical/surgical history that increases complexity of ED encounter: Lung cancer  Interpretation of Diagnostics I personally reviewed the Chest Xray and my interpretation is as follows: Diffuse left-sided opacification  Follow-up CT revealing possible early pneumonia, pericardial effusion that is small but a bit bigger than prior, small pleural effusion, known tumor burden  Patient Reassessment and Ultimate Disposition/Management     Patient's oxygen requirement has increased to 5 L while here in the emergency department, a bit more short of breath than she was.  Another extended discussion with patient and patient's family, patient really does not like being hospitalized and would prefer going home but she is also afraid to go home.  Plan is to have patient and patient's family discussed the pros and cons of admission versus discharge, in the meantime we will obtain basic labs to facilitate admission if that is their choice.  I made it clear that the decision to go home would be riskier than the admission to coming to the hospital with regard to clearing the current pneumonia but also made clear that admission and aggressive IV antibiotics is more of a stall given her underlying pathology.  Signed out to oncoming provider at shift change.  Patient management required discussion with the following services or consulting groups:  None  Complexity of Problems Addressed Acute illness or injury that poses threat of life of bodily function  Additional Data Reviewed and Analyzed Further history obtained from: Further history from spouse/family member  Additional Factors Impacting ED Encounter Risk Consideration of hospitalization  Elmer Sow. Pilar Plate, MD [facility] Emergency Medicine [facility]  Health [email or web address]  Final Clinical Impressions(s) / ED Diagnoses     ICD-10-CM   1. Community acquired pneumonia of left lung, unspecified part of lung  J18.9     2. Hypoxia  R09.02       ED Discharge Orders     None        Discharge Instructions Discussed with and Provided to Patient:   Discharge Instructions   None      Sabas Sous, MD 09/20/23 0715    Sabas Sous, MD 09/20/23 [phone]

## 2023-09-20 NOTE — ED Notes (Signed)
 Pt lying in bed sleeping with family at bedside. Pt still on Bipap, VS WNL. Family asked could pt get something to eat when she woke. Dr. Laural Benes was notified of request.

## 2023-09-20 NOTE — TOC Initial Note (Signed)
 Transition of Care [facility]) - Initial/Assessment Note    Patient Details  Name: Regina Mcmahon MRN: [id number] Date of Birth: 07/19/1951  Transition of Care [facility]) CM/SW Contact:    Isabella Bowens, LCSWA Phone Number: 09/20/2023, 12:45 PM  Clinical Narrative:                 CSW spoke with sister and daughter who was at bedside. Pt is under hospice care with [facility] at home. Patient has hospice care for her COPD and Lung cancer. Sister [name] is point of contact and takes care of everything. Sister shared that they are working on getting pt around the clock care so she is not home alone. The only equipment pt has is a nebulizer machine and oxygen. Family provides transportation. TOC will continue to follow.   Expected Discharge Plan: Home w Hospice Care Barriers to Discharge: ED No Barriers   Patient Goals and CMS Choice Patient states their goals for this hospitalization and ongoing recovery are:: return back home CMS Medicare.gov Compare Post Acute Care list provided to:: Patient Represenative (must comment) [name] Sister) Choice offered to / list presented to : Sibling      Expected Discharge Plan and Services In-house Referral: Clinical Social Work   Post Acute Care Choice: Hospice Living arrangements for the past 2 months: Apartment                 DME Arranged: N/A DME Agency: NA       HH Arranged: NA HH Agency: NA        Prior Living Arrangements/Services Living arrangements for the past 2 months: Apartment Lives with:: Self Patient language and need for interpreter reviewed:: Yes Do you feel safe going back to the place where you live?: Yes      Need for Family Participation in Patient Care: Yes (Comment) Care giver support system in place?: Yes (comment)   Criminal Activity/Legal Involvement Pertinent to Current Situation/Hospitalization: No - Comment as needed  Activities of Daily Living      Permission Sought/Granted      Share Information with NAME:  [name]     Permission granted to share info w Relationship: Sister     Emotional Assessment Appearance:: Appears stated age   Affect (typically observed): Calm, Appropriate Orientation: : Oriented to Self, Oriented to Place, Oriented to  Time, Oriented to Situation Alcohol / Substance Use: Not Applicable Psych Involvement: No (comment)  Admission diagnosis:  Acute respiratory distress [R06.03] Patient Active Problem List   Diagnosis Date Noted   Acute respiratory distress 09/20/2023   DNR (do not resuscitate) 09/20/2023   Acute exacerbation of chronic obstructive pulmonary disease (COPD) (HCC) 05/29/2023   Elevated brain natriuretic peptide (BNP) level 05/29/2023   Acute respiratory failure with hypoxia (HCC) 05/29/2023   Acquired hypothyroidism 05/29/2023   Squamous cell lung cancer (HCC) 08/31/2021   Pulmonary nodules 08/23/2021   Mediastinal adenopathy 08/06/2021   Thyroid nodule 08/06/2021   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   Abnormal CT of the chest 07/29/2021   History of total right hip arthroplasty 04/15/2021   COPD GOLD II  /  still smoking  10/12/2020   Shortness of breath 09/12/2016   Well woman exam with routine gynecological exam 04/20/2016   Morbid obesity due to excess calories (HCC) 03/03/2016   Colon adenomas 03/03/2016   Rash 01/18/2016   GERD without esophagitis 10/19/2015   Osteopenia determined by x-ray 09/07/2015   High risk medication use 09/07/2015  Genetic testing 03/20/2015   Family history of ovarian cancer 03/20/2015   Family history of colon cancer 03/20/2015   Family history of cancer 03/20/2015   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 [place] - [address] [place] [address] Phone: [phone] Fax: [phone]     Social Drivers of Health (SDOH) Social History: SDOH Screenings   Food Insecurity: No Food Insecurity (05/29/2023)  Housing: Low Risk  (05/29/2023)  Transportation Needs: No Transportation Needs (05/29/2023)  Utilities: Not At Risk (05/29/2023)  Depression (PHQ2-9): Low Risk  (08/05/2021)  Social Connections: Unknown (08/05/2021)  Tobacco Use: Medium Risk (09/20/2023)   SDOH Interventions:     Readmission Risk Interventions     No data to display

## 2023-09-20 NOTE — Progress Notes (Signed)
 BIPAP on standby at this time. Patient's family at bedside. Told patient to call staff if she felt like she needed machine. Doing well on nasal cannula at this time. Breathing treatment given.

## 2023-09-20 NOTE — ED Triage Notes (Signed)
 Pt BIB from home by [facility] c/o fever, body aches and just not feeling well. Pt is currently under hospice care for COPD and lung CA. Pt wears 3L [place] chronically at home.   Pt given albuterol treatment by EMS.  Per hospice RN pt is a DNR and has everything at home she needed, she is unsure why she called EMS.  Pt states she tore up her DNR paper.

## 2023-09-20 NOTE — ED Notes (Signed)
 Oral meds not given due to pt being on BiPAP

## 2023-09-20 NOTE — H&P (Signed)
 History and Physical  [facility]  ALANDRIA BUTKIEWICZ ZOX:[id number] DOB: [birth year] DOA: 09/20/2023  PCP: Pcp, No  Patient coming from: Home by [facility]  Level of care: Stepdown  I have personally briefly reviewed patient's old medical records in [facility]  Chief Complaint: Toma Deiters   HPI: Regina Mcmahon is a 73 year old female with stage III squamous cell lung cancer s/p chemoradiation therapy and immunotherapy who developed recurrent disease and now on home hospice care,  hypothyroidism, COPD, history of breast cancer, HTN, GERD who was brought in by [facility] complaining of fever, malaise, SOB and body aches.  She is chronically on 3L/min [place] at home and was having SOB symptoms, nonproductive cough, chest congestion and was noted to be wheezing and showing signs of acute on chronic respiratory distress.  She was placed on bipap and her testing was showing signs of pneumonia but not of pulmonary embolus.  Pt is being admitted to address acute on chronic respiratory failure secondary to pneumonia, likely advancing lung cancer; She remains DNR/DNI and will resume home hospice care when discharged.     Past Medical History:  Diagnosis Date   Arthritis    Asthma    Asymptomatic varicose veins    Breast cancer (HCC) 06/2014   left   Cancer (HCC)    Supra Glottis   Chronic airway obstruction (HCC)    Complication of anesthesia 2016   difficulty waking up, Oxygen dropped low.   COPD (chronic obstructive pulmonary disease) (HCC)    Depression    Dyspnea    GERD (gastroesophageal reflux disease)    Hemorrhage rectum    and anus.   Hypertension    Insomnia    Other symptoms involving digestive system(787.99)    Pain    Hx.pain in joint involving pelvic region and thigh; pain in limb   Palpitations    Panic attacks    Personal history of chemotherapy 2016   Personal history of radiation therapy 2016   Pneumonia    Pre-diabetes    Prediabetes    Sinusitis    Symptomatic  menopausal or female climacteric states    Varicose veins of both lower extremities with pain     Past Surgical History:  Procedure Laterality Date   BREAST BIOPSY Left 10/2012   BREAST BIOPSY Left 07/01/2014   malignant   BREAST LUMPECTOMY Left 08/20/2014   BRONCHIAL BIOPSY  08/23/2021   Procedure: BRONCHIAL BIOPSIES;  Surgeon: Leslye Peer, MD;  Location: [facility];  Service: Pulmonary;;   BRONCHIAL BRUSHINGS  08/23/2021   Procedure: BRONCHIAL BRUSHINGS;  Surgeon: Leslye Peer, MD;  Location: [facility];  Service: Pulmonary;;   BRONCHIAL NEEDLE ASPIRATION BIOPSY  08/23/2021   Procedure: BRONCHIAL NEEDLE ASPIRATION BIOPSIES;  Surgeon: Leslye Peer, MD;  Location: [facility];  Service: Pulmonary;;   BRONCHIAL WASHINGS  08/23/2021   Procedure: BRONCHIAL WASHINGS;  Surgeon: Leslye Peer, MD;  Location: [facility];  Service: Pulmonary;;   CESAREAN SECTION     CHOLECYSTECTOMY  1985   COLONOSCOPY  2002   Dr. Cleotis Nipper: internal hemorrhoids, one small rectal polyp, adenomatous path    COLONOSCOPY N/A 11/23/2015   Procedure: COLONOSCOPY;  Surgeon: West Bali, MD;  [facility]: [facility];  Service: Endoscopy;  Laterality: N/A;  1100 - moved to 10:45 - office to notify   ESOPHAGOGASTRODUODENOSCOPY N/A 11/23/2015   Procedure: ESOPHAGOGASTRODUODENOSCOPY (EGD);  Surgeon: West Bali, MD;  [facility];  Service: Endoscopy;  Laterality: N/A;   IR IMAGING GUIDED PORT INSERTION  11/08/2021   PORT-A-CATH REMOVAL  02/2015   PORTACATH PLACEMENT Right 09/17/2014   Procedure: INSERTION PORT-A-CATH WITH ULTRASOUND;  Surgeon: Harriette Bouillon, MD;  Location: [facility];  Service: General;  Laterality: Right;  IJ   RADIOACTIVE SEED GUIDED PARTIAL MASTECTOMY WITH AXILLARY SENTINEL LYMPH NODE BIOPSY Left 08/20/2014   Procedure: LEFT BREAST LUMPECTOMY WITH RADIOACTIVE SEED LOCALIZATION AND SENTINEL LYMPH NODE MAPPING;  Surgeon: Harriette Bouillon, MD;  Location: [facility];  Service: General;  Laterality: Left;   RE-EXCISION OF BREAST LUMPECTOMY Left 09/17/2014   Procedure: RE-EXCISION OF BREAST LUMPECTOMY;  Surgeon: Harriette Bouillon, MD;  Location: [facility];  Service: General;  Laterality: Left;   TOTAL HIP ARTHROPLASTY Right 02/22/2021   Procedure: RIGHT TOTAL HIP ARTHROPLASTY ANTERIOR APPROACH;  Surgeon: Eldred Manges, MD;  [facility]: [facility];  Service: Orthopedics;  Laterality: Right;   TUBAL LIGATION     VIDEO BRONCHOSCOPY WITH ENDOBRONCHIAL ULTRASOUND N/A 08/23/2021   Procedure: VIDEO BRONCHOSCOPY WITH ENDOBRONCHIAL ULTRASOUND;  Surgeon: Leslye Peer, MD;  [facility];  Service: Pulmonary;  Laterality: N/A;   VIDEO BRONCHOSCOPY WITH RADIAL ENDOBRONCHIAL ULTRASOUND  08/23/2021   Procedure: VIDEO BRONCHOSCOPY WITH RADIAL ENDOBRONCHIAL ULTRASOUND;  Surgeon: Leslye Peer, MD;  [facility];  Service: Pulmonary;;     reports that she quit smoking about 2 years ago. Her smoking use included cigarettes. She started smoking about 44 years ago. She has a 42 pack-year smoking history. She has never used smokeless tobacco. She reports that she does not drink alcohol and does not use drugs.  No Known Allergies  Family History  Problem Relation Age of Onset   Diabetes Mother    Ovarian cancer Mother 46   Other Father        died in MVA at age 87   Other Sister        mitral valve disorder   Melanoma Sister 60       removed from back of leg   Colon cancer Brother        early 61s, succumbed to disease   Cancer Paternal Aunt        leukemia, bone, or lung cancer   Alzheimer's disease Maternal Grandmother    Heart attack Maternal Grandfather    Heart attack Paternal Grandmother    COPD Paternal Grandfather    Emphysema Paternal Grandfather    Congestive Heart Failure Paternal Aunt    Stomach cancer Paternal Uncle    Kidney cancer Maternal Uncle    Cancer Cousin        dx. teens/dx. 40s   Cancer Cousin     Colon cancer Cousin     Prior to Admission medications   Medication Sig Start Date End Date Taking? Authorizing Provider  albuterol (VENTOLIN HFA) 108 (90 Base) MCG/ACT inhaler Inhale 2 puffs into the lungs every 4 (four) hours as needed for shortness of breath. 07/04/22  Yes [provider]  ALPRAZolam Prudy Feeler) 0.5 MG tablet Take 0.5 mg by mouth 3 (three) times daily.   Yes [provider]  ANORO ELLIPTA 62.5-25 MCG/INH AEPB Inhale 1 puff into the lungs daily. 10/17/19  Yes [provider]  azithromycin (ZITHROMAX) 250 MG tablet Take by mouth. 09/12/23  Yes [provider]  calcium carbonate (TUMS - DOSED IN MG ELEMENTAL CALCIUM) 500 MG chewable tablet Chew 2 tablets by mouth 3 (three) times daily as needed for indigestion  or heartburn.   Yes [provider]  fentaNYL (DURAGESIC) 50 MCG/HR 1 patch every 3 (three) days. 09/18/23  Yes [provider]  ibuprofen (ADVIL) 100 MG/5ML suspension Take 10 mLs (200 mg total) by mouth every 4 (four) hours as needed for moderate pain. 01/23/23  Yes Doreatha Massed, MD  levothyroxine (SYNTHROID) 100 MCG tablet TAKE 1 TABLET BY MOUTH ONCE DAILY BEFORE BREAKFAST 07/27/23  Yes Doreatha Massed, MD  lidocaine-prilocaine (EMLA) cream APPLY CREAM TOPICALLY AS NEEDED 01/30/23  Yes Doreatha Massed, MD  Morphine Sulfate (MORPHINE CONCENTRATE) 10 mg / 0.5 ml concentrated solution SMARTSIG:0.25 Milliliter(s) By Mouth Every 2 Hours PRN 09/18/23  Yes [provider]  polyethylene glycol (MIRALAX / GLYCOLAX) 17 g packet Take 17 g by mouth daily as needed for mild constipation.   Yes [provider]  albuterol (PROVENTIL) (2.5 MG/3ML) 0.083% nebulizer solution SMARTSIG:1 Vial(s) Via Nebulizer 4 Times Daily PRN 09/18/23   [provider]  Menthol, Topical Analgesic, (BIOFREEZE EX) Apply 1 application topically daily as needed (pain).    [provider]    Physical Exam: Vitals:    09/20/23 1015 09/20/23 1100 09/20/23 1115 09/20/23 1130  BP: 104/73 108/83 122/77 123/77  Pulse: 90 85 86 84  Resp: 20 18 (!) 24 19  Temp:      TempSrc:      SpO2: 93% 92% 94% 97%  Weight:      Height:        Constitutional: on bipap, appears moderately distressed. Eyes: PERRL, lids and conjunctivae normal ENMT: Mucous membranes are moist. Posterior pharynx clear of any exudate or lesions.Normal dentition.  Neck: normal, supple, no masses, no thyromegaly Respiratory: diffuse expiratory wheezing and rales heard bilaterally.  Cardiovascular: normal s1, s2 sounds, no murmurs / rubs / gallops. No extremity edema. 2+ pedal pulses. No carotid bruits.  Abdomen: no tenderness, no masses palpated. No hepatosplenomegaly. Bowel sounds positive.  Musculoskeletal: no clubbing / cyanosis. No joint deformity upper and lower extremities. Good ROM, no contractures. Normal muscle tone.  Skin: no rashes, lesions, ulcers. No induration Neurologic: CN 2-12 grossly intact. Sensation intact, DTR normal. Strength 5/5 in all 4.  Psychiatric: Alert but somnolent.    Labs on Admission: I have personally reviewed following labs and imaging studies  CBC: Recent Labs  Lab 09/20/23 0714  WBC 8.8  HGB 15.2*  HCT 48.1*  MCV 91.6  PLT 233   Basic Metabolic Panel: Recent Labs  Lab 09/20/23 0714  NA 140  K 5.0  CL 100  CO2 34*  GLUCOSE 123*  BUN 11  CREATININE 0.81  CALCIUM 9.5   GFR: Estimated Creatinine Clearance: 70.8 mL/min (by C-G formula based on SCr of 0.81 mg/dL). Liver Function Tests: Recent Labs  Lab 09/20/23 0714  AST 16  ALT 13  ALKPHOS 64  BILITOT 1.4*  PROT 7.3  ALBUMIN 3.5   No results for input(s): "LIPASE", "AMYLASE" in the last 168 hours. No results for input(s): "AMMONIA" in the last 168 hours. Coagulation Profile: No results for input(s): "INR", "PROTIME" in the last 168 hours. Cardiac Enzymes: No results for input(s): "CKTOTAL", "CKMB", "CKMBINDEX", "TROPONINI" in  the last 168 hours. BNP (last 3 results) No results for input(s): "PROBNP" in the last 8760 hours. HbA1C: No results for input(s): "HGBA1C" in the last 72 hours. CBG: No results for input(s): "GLUCAP" in the last 168 hours. Lipid Profile: No results for input(s): "CHOL", "HDL", "LDLCALC", "TRIG", "CHOLHDL", "LDLDIRECT" in the last 72 hours. Thyroid Function Tests:  No results for input(s): "TSH", "T4TOTAL", "FREET4", "T3FREE", "THYROIDAB" in the last 72 hours. Anemia Panel: No results for input(s): "VITAMINB12", "FOLATE", "FERRITIN", "TIBC", "IRON", "RETICCTPCT" in the last 72 hours. Urine analysis:    Component Value Date/Time   COLORURINE YELLOW 06/14/2022 1331   APPEARANCEUR HAZY (A) 06/14/2022 1331   LABSPEC 1.025 06/14/2022 1331   PHURINE 5.0 06/14/2022 1331   GLUCOSEU NEGATIVE 06/14/2022 1331   HGBUR NEGATIVE 06/14/2022 1331   BILIRUBINUR NEGATIVE 06/14/2022 1331   KETONESUR NEGATIVE 06/14/2022 1331   PROTEINUR 30 (A) 06/14/2022 1331   UROBILINOGEN 0.2 10/10/2014 1515   NITRITE NEGATIVE 06/14/2022 1331   LEUKOCYTESUR LARGE (A) 06/14/2022 1331    Radiological Exams on Admission: CT Chest Wo Contrast Result Date: 09/20/2023 CLINICAL DATA:  Pneumonia, complication suspected. Under hospice for lung cancer EXAM: CT CHEST WITHOUT CONTRAST TECHNIQUE: Multidetector CT imaging of the chest was performed following the standard protocol without IV contrast. RADIATION DOSE REDUCTION: This exam was performed according to the departmental dose-optimization program which includes automated exposure control, adjustment of the mA and/or kV according to patient size and/or use of iterative reconstruction technique. COMPARISON:  Chest radiograph from earlier today.  PET CT 05/18/2023 FINDINGS: Cardiovascular: Normal heart size. Moderate pericardial effusion that is increased, up to 17 mm in thickness superiorly on coronal reformats. There is atherosclerosis affecting the aorta and coronaries.  Mediastinum/Nodes: Progression of left superior mediastinal lymphadenopathy, and upper left paratracheal node measures 2.6 cm compared to 13 mm on comparison PET. Pre-vascular adenopathy contacting the anterior chest wall measures up to 2.4 cm, increased in thickness as well. There is a right supraclavicular mass measuring up to 4.1 cm length, progressed from 3.3 cm previously. Chronic right thyroid nodule with tracheal deviation. Lungs/Pleura: Progressive opacification of the left upper lobe with central airway obstruction. At the left mainstem bronchus there is eccentric luminal based nodularity in thickening suggesting tumor. Radiation fibrosis changes overlap the left hilum. Small left pleural effusion which is posteriorly located. Mild airspace disease in the right middle lobe. There is airway thickening and emphysema seen in the right lung. Upper Abdomen: No acute finding.  No masslike areas. Musculoskeletal: Chronic T5 lesion in with vertebral body collapse. No acute or aggressive finding. IMPRESSION: 1. Collapsed left upper lobe since 2024 imaging with central airway opacification likely reflecting tumoral obstruction. There is also progressive adenopathy in the right supraclavicular space and upper mediastinum. 2. Increase in pericardial fluid, up to 18 mm in thickness superiorly. Small left pleural effusion since comparison PET. 3. Mild airspace disease in the right middle lobe, possible early pneumonia. Electronically Signed   By: Tiburcio Pea M.D.   On: 09/20/2023 06:55   DG Chest Port 1 View Result Date: 09/20/2023 CLINICAL DATA:  Shortness of breath EXAM: PORTABLE CHEST 1 VIEW COMPARISON:  05/29/2023 FINDINGS: Porta catheter on the right with tip at the SVC. Significant volume loss and hazy density in the left lung since comparison. There is an interface over the left lung without discrete pleural line, no definite pneumothorax. Interstitial coarsening on the right in the setting of COPD. Non  obscured heart size is unchanged. Leftward tracheal deviation from a thyroid nodule. IMPRESSION: New and significant opacification of the left chest with volume loss, suggest CT correlate in this patient with history of ipsilateral lung cancer. Electronically Signed   By: Tiburcio Pea M.D.   On: 09/20/2023 05:09   EKG: Independently reviewed.   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)   Abnormal CT of the chest   Squamous cell lung cancer (HCC)   Acquired hypothyroidism   DNR (do not resuscitate)   Acute on chronic respiratory failure with hypoxia - presumably secondary to pneumonia - check a procalcitonin test  - continue supportive measures - bipap as needed - bronchodilators as ordered - try to wean back to home supplemental oxygen requirement of 3L/min - oral morphine for severe dyspnea symptoms resumed from home hospice orders  Community Acquired Pneumonia - continue IV antibiotics as ordered - continue supportive measures as ordered - wean oxygen to baseline requirements as able  DNR/DNI - confirmed with family on admission - continue orders in hospital setting  Hypothyroidism - resume home medication   DVT prophylaxis: enoxaparin   Code Status: DNR   Family Communication: bedside update   Disposition Plan: anticipate returning home with hospice in 1-2 days   Consults called:   Admission status: OBV    Level of care: Stepdown Standley Dakins MD [facility] How to contact the [facility] Attending or Consulting provider 7A - 7P or covering provider during after hours 7P -7A, for this patient?  Check the care team in [facility] and look for a) attending/consulting [facility] provider listed and b) the [facility] team listed Log into [email or web address] and use [place]'s universal password to access. If you do not have the password, please contact the hospital operator. Locate the  [facility] provider you are looking for under [facility] and page to a number that you can be directly reached. If you still have difficulty reaching the provider, please page the [facility] (Director on Call) for the Hospitalists listed on amion for assistance.   If 7PM-7AM, please contact night-coverage [email or web address] Password [facility]  09/20/2023, 12:03 PM

## 2023-09-20 NOTE — ED Provider Notes (Signed)
  Physical Exam  BP (!) 146/90   Pulse 99   Temp 98.7 F (37.1 C) (Oral)   Resp (!) 27   Ht 5\' 3"  (1.6 m)   Wt 94.9 kg   SpO2 97%   BMI 37.06 kg/m   Physical Exam Vitals and nursing note reviewed.  Constitutional:      General: She is not in acute distress.    Appearance: She is well-developed.  HENT:     Head: Normocephalic and atraumatic.  Eyes:     Conjunctiva/sclera: Conjunctivae normal.  Cardiovascular:     Rate and Rhythm: Regular rhythm. Tachycardia present.     Heart sounds: No murmur heard. Pulmonary:     Effort: Respiratory distress present.     Breath sounds: Wheezing present.  Abdominal:     Palpations: Abdomen is soft.     Tenderness: There is no abdominal tenderness.  Musculoskeletal:        General: No swelling.     Cervical back: Neck supple.  Skin:    General: Skin is warm and dry.     Capillary Refill: Capillary refill takes less than 2 seconds.  Neurological:     Mental Status: She is alert.  Psychiatric:        Mood and Affect: Mood normal.     Procedures  .Critical Care  Performed by: Glendora Score, MD Authorized by: Glendora Score, MD   Critical care provider statement:    Critical care time (minutes):  30   Critical care was necessary to treat or prevent imminent or life-threatening deterioration of the following conditions:  Respiratory failure   Critical care was time spent personally by me on the following activities:  Development of treatment plan with patient or surrogate, discussions with consultants, evaluation of patient's response to treatment, examination of patient, ordering and review of laboratory studies, ordering and review of radiographic studies, ordering and performing treatments and interventions, pulse oximetry, re-evaluation of patient's condition and review of old charts   ED Course / MDM    Medical Decision Making Amount and/or Complexity of Data Reviewed Labs: ordered. Radiology:  ordered.  Risk Prescription drug management. Decision regarding hospitalization.   Patient received in handoff.  Squamous cell carcinoma of the lungs currently in hospice care with respiratory distress.  Pending laboratory evaluation at time of signout.  Laboratory evaluation with a hemoglobin of 15.2, CO2 34 but is otherwise unremarkable.  On reevaluation, patient is in severe respiratory distress, tripoding and splinting.  She is maintaining oxygen saturations but does have significant wheezing.  2 additional DuoNebs ordered and patient placed on BiPAP.  I spoke with patient's hospice nurse Rayfield Citizen at [phone] and confirmed patient's home pain regimen with morphine liquid.  Of note, patient's family members self-administered 1 total milligram of p.o. Xanax prior to my arrival.  Patient states she would like to be hospitalized for stabilization of her respiratory status but CODE STATUS has not changed.  Patient remains DNR/DNI       Glendora Score, MD 09/20/23 [phone]

## 2023-09-20 NOTE — Hospital Course (Signed)
 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  She is chronically on 3L/min [place] at home and was having SOB symptoms, nonproductive cough, chest congestion and was noted to be wheezing and showing signs of acute on chronic respiratory distress.  She was placed on bipap and her testing was showing signs of pneumonia but not of pulmonary embolus.  Pt is being admitted to address acute on chronic respiratory failure secondary to pneumonia, likely advancing lung cancer; She remains DNR/DNI and will resume home hospice care when discharged.

## 2023-09-20 NOTE — Plan of Care (Signed)
 Patient breathing status has improved from admission.  She is still very high anxiety and required PRNs to reduce anxiety attack symptoms.  Anxiety is driving some of her respiratory symptoms.  Family at bedside helping to calm patient.

## 2023-09-21 DIAGNOSIS — R0603 Acute respiratory distress: Secondary | ICD-10-CM | POA: Diagnosis not present

## 2023-09-21 DIAGNOSIS — R9389 Abnormal findings on diagnostic imaging of other specified body structures: Secondary | ICD-10-CM

## 2023-09-21 DIAGNOSIS — Z66 Do not resuscitate: Secondary | ICD-10-CM | POA: Diagnosis not present

## 2023-09-21 LAB — COMPREHENSIVE METABOLIC PANEL
ALT: 14 U/L (ref 0–44)
AST: 15 U/L (ref 15–41)
Albumin: 3.2 g/dL — ABNORMAL LOW (ref 3.5–5.0)
Alkaline Phosphatase: 56 U/L (ref 38–126)
Anion gap: 7 (ref 5–15)
BUN: 11 mg/dL (ref 8–23)
CO2: 31 mmol/L (ref 22–32)
Calcium: 8.8 mg/dL — ABNORMAL LOW (ref 8.9–10.3)
Chloride: 99 mmol/L (ref 98–111)
Creatinine, Ser: 0.67 mg/dL (ref 0.44–1.00)
GFR, Estimated: >60 mL/min (ref >60)
Glucose, Bld: 165 mg/dL — ABNORMAL HIGH (ref 70–99)
Potassium: 4.4 mmol/L (ref 3.5–5.1)
Sodium: 137 mmol/L (ref 135–145)
Total Bilirubin: 1.3 mg/dL — ABNORMAL HIGH (ref 0.0–1.2)
Total Protein: 6.8 g/dL (ref 6.5–8.1)

## 2023-09-21 LAB — CBC WITH DIFFERENTIAL/PLATELET
Abs Immature Granulocytes: 0.02 10*3/uL (ref 0.00–0.07)
Basophils Absolute: 0 10*3/uL (ref 0.0–0.1)
Basophils Relative: 0 %
Eosinophils Absolute: 0 10*3/uL (ref 0.0–0.5)
Eosinophils Relative: 0 %
HCT: 45.7 % (ref 36.0–46.0)
Hemoglobin: 14.6 g/dL (ref 12.0–15.0)
Immature Granulocytes: 0 %
Lymphocytes Relative: 5 %
Lymphs Abs: 0.2 10*3/uL — ABNORMAL LOW (ref 0.7–4.0)
MCH: 29.3 pg (ref 26.0–34.0)
MCHC: 31.9 g/dL (ref 30.0–36.0)
MCV: 91.6 fL (ref 80.0–100.0)
Monocytes Absolute: 0 10*3/uL — ABNORMAL LOW (ref 0.1–1.0)
Monocytes Relative: 0 %
Neutro Abs: 4.3 10*3/uL (ref 1.7–7.7)
Neutrophils Relative %: 95 %
Platelets: 197 10*3/uL (ref 150–400)
RBC: 4.99 MIL/uL (ref 3.87–5.11)
RDW: 12.3 % (ref 11.5–15.5)
WBC: 4.6 10*3/uL (ref 4.0–10.5)
nRBC: 0 % (ref 0.0–0.2)

## 2023-09-21 LAB — GLUCOSE, CAPILLARY: Glucose-Capillary: 158 mg/dL — ABNORMAL HIGH (ref 70–99)

## 2023-09-21 LAB — PROCALCITONIN: Procalcitonin: <0.1 ng/mL

## 2023-09-21 LAB — MAGNESIUM: Magnesium: 2.2 mg/dL (ref 1.7–2.4)

## 2023-09-21 MED ORDER — ONDANSETRON HCL 4 MG PO TABS: 4.0000 mg | ORAL_TABLET | Freq: Three times a day (TID) | ORAL | 0 refills | Status: AC | PRN

## 2023-09-21 MED ORDER — NITROGLYCERIN 0.4 MG SL SUBL: 0.4000 mg | SUBLINGUAL_TABLET | SUBLINGUAL | Status: DC | PRN

## 2023-09-21 MED ORDER — DOXYCYCLINE HYCLATE 100 MG PO CAPS: 100.0000 mg | ORAL_CAPSULE | Freq: Two times a day (BID) | ORAL | 0 refills | Status: AC

## 2023-09-21 MED ORDER — PREDNISONE 20 MG PO TABS
40.0000 mg | ORAL_TABLET | Freq: Every day | ORAL | 0 refills | Status: AC
Start: 2023-09-22 — End: 2023-09-26

## 2023-09-21 NOTE — Discharge Summary (Signed)
 Physician Discharge Summary  Regina Mcmahon ZOX:[id number] DOB: July 28, 1950 DOA: 09/20/2023  Admit date: 09/20/2023 Discharge date: 09/21/2023  Admitted From:  Home Hospice Disposition:  Home Hospice Regina Mcmahon)  Recommendations for Outpatient Follow-up:  Please resume home hospice care services  Call hospice RN first before calling 911 or returning to ED  Home Health: Hospice   Discharge Condition: HOSPICE   CODE STATUS: DNR DIET: hospice    Brief Hospitalization Summary: Please see all hospital notes, images, labs for full details of the hospitalization. 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  She is chronically on 3L/min [place] at home and was having SOB symptoms, nonproductive cough, chest congestion and was noted to be wheezing and showing signs of acute on chronic respiratory distress.  She was placed on bipap and her testing was showing signs of pneumonia but not of pulmonary embolus.  Pt is being admitted to address acute on chronic respiratory failure secondary to pneumonia, likely advancing lung cancer; She remains DNR/DNI and will resume home hospice care when discharged.    Hospital Course   Pt was admitted from home hospice with acute respiratory distress due to community acquired pneumonia and mild COPD exacerbation, She was briefly on bipap therapy but has rapidly weaned down to baseline [place] of 4L/min.  She was treated with IV antibiotics, steroids and scheduled bronchodilators.  She reports today that she is feeling back to her baseline and she wants to resume home hospice care with [facility] hospice.  She is being discharged back to home with home hospice today per her wishes.  She reported that hospice is able to provide the care  that she needs at home.  She also has strong family support and care for supervision at home.   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   albuterol (2.5 MG/3ML) 0.083% nebulizer solution Commonly known as: PROVENTIL SMARTSIG:1 Vial(s) Via Nebulizer 4 Times Daily PRN   ALPRAZolam 0.5 MG tablet Commonly known as: XANAX Take 0.5 mg by mouth 3 (three) times daily.   Anoro Ellipta 62.5-25 MCG/INH Aepb Generic drug: umeclidinium-vilanterol Inhale 1 puff into the lungs daily.   BIOFREEZE EX Apply 1 application topically daily as needed (pain).   calcium carbonate 500 MG chewable tablet Commonly known as: TUMS - dosed in mg elemental calcium Chew 2 tablets by mouth 3 (three) times daily as needed for indigestion or heartburn.   doxycycline 100 MG capsule Commonly known as: VIBRAMYCIN Take 1 capsule (100 mg total) by mouth 2 (two) times daily for 4 days.   fentaNYL 50 MCG/HR  Commonly known as: DURAGESIC 1 patch every 3 (three) days.   ibuprofen 100 MG/5ML suspension Commonly known as: ADVIL Take 10 mLs (200 mg total) by mouth every 4 (four) hours as needed for moderate pain.   levothyroxine 100 MCG tablet Commonly known as: SYNTHROID TAKE 1 TABLET BY MOUTH ONCE DAILY BEFORE BREAKFAST   lidocaine-prilocaine cream Commonly known as: EMLA APPLY CREAM TOPICALLY AS  NEEDED   morphine CONCENTRATE 10 mg / 0.5 ml concentrated solution SMARTSIG:0.25 Milliliter(s) By Mouth Every 2 Hours PRN   polyethylene glycol 17 g packet Commonly known as: MIRALAX / GLYCOLAX Take 17 g by mouth daily as needed for mild constipation.   predniSONE 20 MG tablet Commonly known as: DELTASONE Take 2 tablets (40 mg total) by mouth daily with breakfast for 4 days. Start taking on: September 22, 2023        No Known Allergies Allergies as of 09/21/2023   No Known Allergies      Medication List     STOP taking these medications    azithromycin 250 MG tablet Commonly known as: ZITHROMAX       TAKE these medications    albuterol 108 (90 Base) MCG/ACT inhaler Commonly known as: VENTOLIN HFA Inhale 2 puffs into the lungs every 4 (four) hours as needed for shortness of breath.   albuterol (2.5 MG/3ML) 0.083% nebulizer solution Commonly known as: PROVENTIL SMARTSIG:1 Vial(s) Via Nebulizer 4 Times Daily PRN   ALPRAZolam 0.5 MG tablet Commonly known as: XANAX Take 0.5 mg by mouth 3 (three) times daily.   Anoro Ellipta 62.5-25 MCG/INH Aepb Generic drug: umeclidinium-vilanterol Inhale 1 puff into the lungs daily.   BIOFREEZE EX Apply 1 application topically daily as needed (pain).   calcium carbonate 500 MG chewable tablet Commonly known as: TUMS - dosed in mg elemental calcium Chew 2 tablets by mouth 3 (three) times daily as needed for indigestion or heartburn.   doxycycline 100 MG capsule Commonly known as: VIBRAMYCIN Take 1 capsule (100 mg total) by mouth 2 (two) times daily for 4 days.   fentaNYL 50 MCG/HR Commonly known as: DURAGESIC 1 patch every 3 (three) days.   ibuprofen 100 MG/5ML suspension Commonly known as: ADVIL Take 10 mLs (200 mg total) by mouth every 4 (four) hours as needed for moderate pain.   levothyroxine 100 MCG tablet Commonly known as: SYNTHROID TAKE 1 TABLET BY MOUTH ONCE DAILY BEFORE BREAKFAST   lidocaine-prilocaine  cream Commonly known as: EMLA APPLY CREAM TOPICALLY AS NEEDED   morphine CONCENTRATE 10 mg / 0.5 ml concentrated solution SMARTSIG:0.25 Milliliter(s) By Mouth Every 2 Hours PRN   polyethylene glycol 17 g packet Commonly known as: MIRALAX / GLYCOLAX Take 17 g by mouth daily as needed for mild constipation.   predniSONE 20 MG tablet Commonly known as: DELTASONE Take 2 tablets (40 mg total) by mouth daily with breakfast for 4 days. Start taking on: September 22, 2023        Procedures/Studies: CT Chest Wo Contrast Result Date: 09/20/2023 CLINICAL DATA:  Pneumonia, complication suspected. Under hospice for lung cancer EXAM: CT CHEST WITHOUT CONTRAST TECHNIQUE: Multidetector CT imaging of the chest was performed following the standard protocol without IV contrast. RADIATION DOSE REDUCTION: This exam was performed according to the departmental dose-optimization program which includes automated exposure control, adjustment of the mA and/or kV according to patient size and/or use of iterative reconstruction technique. COMPARISON:  Chest radiograph from earlier today.  PET CT 05/18/2023 FINDINGS: Cardiovascular: Normal heart size. Moderate pericardial  effusion that is increased, up to 17 mm in thickness superiorly on coronal reformats. There is atherosclerosis affecting the aorta and coronaries. Mediastinum/Nodes: Progression of left superior mediastinal lymphadenopathy, and upper left paratracheal node measures 2.6 cm compared to 13 mm on comparison PET. Pre-vascular adenopathy contacting the anterior chest wall measures up to 2.4 cm, increased in thickness as well. There is a right supraclavicular mass measuring up to 4.1 cm length, progressed from 3.3 cm previously. Chronic right thyroid nodule with tracheal deviation. Lungs/Pleura: Progressive opacification of the left upper lobe with central airway obstruction. At the left mainstem bronchus there is eccentric luminal based nodularity in thickening  suggesting tumor. Radiation fibrosis changes overlap the left hilum. Small left pleural effusion which is posteriorly located. Mild airspace disease in the right middle lobe. There is airway thickening and emphysema seen in the right lung. Upper Abdomen: No acute finding.  No masslike areas. Musculoskeletal: Chronic T5 lesion in with vertebral body collapse. No acute or aggressive finding. IMPRESSION: 1. Collapsed left upper lobe since 2024 imaging with central airway opacification likely reflecting tumoral obstruction. There is also progressive adenopathy in the right supraclavicular space and upper mediastinum. 2. Increase in pericardial fluid, up to 18 mm in thickness superiorly. Small left pleural effusion since comparison PET. 3. Mild airspace disease in the right middle lobe, possible early pneumonia. Electronically Signed   By: Tiburcio Pea M.D.   On: 09/20/2023 06:55   DG Chest Port 1 View Result Date: 09/20/2023 CLINICAL DATA:  Shortness of breath EXAM: PORTABLE CHEST 1 VIEW COMPARISON:  05/29/2023 FINDINGS: Porta catheter on the right with tip at the SVC. Significant volume loss and hazy density in the left lung since comparison. There is an interface over the left lung without discrete pleural line, no definite pneumothorax. Interstitial coarsening on the right in the setting of COPD. Non obscured heart size is unchanged. Leftward tracheal deviation from a thyroid nodule. IMPRESSION: New and significant opacification of the left chest with volume loss, suggest CT correlate in this patient with history of ipsilateral lung cancer. Electronically Signed   By: Tiburcio Pea M.D.   On: 09/20/2023 05:09     Subjective: Pt reports she is breathing a lot better back to her baseline and wants to discharge back home and resume home hospice care.  She is reporting that Regina Mcmahon is following her at home for hospice care.    Discharge Exam: Vitals:   09/21/23 0610 09/21/23 0748  BP: 132/67   Pulse: 86    Resp: 20   Temp:  97.8 F (36.6 C)  SpO2: 95%    Vitals:   09/21/23 0400 09/21/23 0506 09/21/23 0610 09/21/23 0748  BP: 125/70  132/67   Pulse: 92 92 86   Resp: (!) 21  20   Temp:    97.8 F (36.6 C)  TempSrc:    Oral  SpO2: 92% 96% 95%   Weight:  93.2 kg    Height:       General: Pt is alert, awake, not in acute distress Cardiovascular: normal S1/S2 +, no rubs, no gallops Respiratory: no increased work of breathing, scattered rales heard with some mild expiratory wheezing. Abdominal: Soft, NT, ND, bowel sounds + Extremities: no edema, no cyanosis   The results of significant diagnostics from this hospitalization (including imaging, microbiology, ancillary and laboratory) are listed below for reference.     Microbiology: Recent Results (from the past 240 hours)  Resp panel by RT-PCR (RSV, Flu A&B, Covid) Anterior  Nasal Swab     Status: None   Collection Time: 09/20/23  3:22 AM   Specimen: Anterior Nasal Swab  Result Value Ref Range Status   SARS Coronavirus 2 by RT PCR NEGATIVE NEGATIVE Final    Comment: (NOTE) SARS-CoV-2 target nucleic acids are NOT DETECTED.  The SARS-CoV-2 RNA is generally detectable in upper respiratory specimens during the acute phase of infection. The lowest concentration of SARS-CoV-2 viral copies this assay can detect is 138 copies/mL. A negative result does not preclude SARS-Cov-2 infection and should not be used as the sole basis for treatment or other patient management decisions. A negative result may occur with  improper specimen collection/handling, submission of specimen other than nasopharyngeal swab, presence of viral mutation(s) within the areas targeted by this assay, and inadequate number of viral copies(<138 copies/mL). A negative result must be combined with clinical observations, patient history, and epidemiological information. The expected result is Negative.  Fact Sheet for Patients:   BloggerCourse.com  Fact Sheet for Healthcare Providers:  SeriousBroker.it  This test is no t yet approved or cleared by the [place] FDA and  has been authorized for detection and/or diagnosis of SARS-CoV-2 by FDA under an Emergency Use Authorization (EUA). This EUA will remain  in effect (meaning this test can be used) for the duration of the COVID-19 declaration under Section 564(b)(1) of the Act, 21 U.S.C.section 360bbb-3(b)(1), unless the authorization is terminated  or revoked sooner.       Influenza A by PCR NEGATIVE NEGATIVE Final   Influenza B by PCR NEGATIVE NEGATIVE Final    Comment: (NOTE) The Xpert Xpress SARS-CoV-2/FLU/RSV plus assay is intended as an aid in the diagnosis of influenza from Nasopharyngeal swab specimens and should not be used as a sole basis for treatment. Nasal washings and aspirates are unacceptable for Xpert Xpress SARS-CoV-2/FLU/RSV testing.  Fact Sheet for Patients: BloggerCourse.com  Fact Sheet for Healthcare Providers: SeriousBroker.it  This test is not yet approved or cleared by the [place] FDA and has been authorized for detection and/or diagnosis of SARS-CoV-2 by FDA under an Emergency Use Authorization (EUA). This EUA will remain in effect (meaning this test can be used) for the duration of the COVID-19 declaration under Section 564(b)(1) of the Act, 21 U.S.C. section 360bbb-3(b)(1), unless the authorization is terminated or revoked.     Resp Syncytial Virus by PCR NEGATIVE NEGATIVE Final    Comment: (NOTE) Fact Sheet for Patients: BloggerCourse.com  Fact Sheet for Healthcare Providers: SeriousBroker.it  This test is not yet approved or cleared by the [place] FDA and has been authorized for detection and/or diagnosis of SARS-CoV-2 by FDA under an Emergency Use  Authorization (EUA). This EUA will remain in effect (meaning this test can be used) for the duration of the COVID-19 declaration under Section 564(b)(1) of the Act, 21 U.S.C. section 360bbb-3(b)(1), unless the authorization is terminated or revoked.  Performed at [facility], [address] [place], [place] [address]   MRSA Next Gen by PCR, Nasal     Status: None   Collection Time: 09/20/23  4:00 PM   Specimen: Nasal Mucosa; Nasal Swab  Result Value Ref Range Status   MRSA by PCR Next Gen NOT DETECTED NOT DETECTED Final    Comment: (NOTE) The GeneXpert MRSA Assay (FDA approved for NASAL specimens only), is one component of a comprehensive MRSA colonization surveillance program. It is not intended to diagnose MRSA infection nor to guide or monitor treatment for MRSA infections. Test performance is  not FDA approved in patients less than 31 years old. Performed at [facility], [address], [place], [place] [address]      Labs: BNP (last 3 results) Recent Labs    05/29/23 0032  BNP 116.0*   Basic Metabolic Panel: Recent Labs  Lab 09/20/23 0714 09/20/23 1301 09/21/23 0353  NA 140  --  137  K 5.0  --  4.4  CL 100  --  99  CO2 34*  --  31  GLUCOSE 123*  --  165*  BUN 11  --  11  CREATININE 0.81 0.75 0.67  CALCIUM 9.5  --  8.8*  MG  --   --  2.2   Liver Function Tests: Recent Labs  Lab 09/20/23 0714 09/21/23 0353  AST 16 15  ALT 13 14  ALKPHOS 64 56  BILITOT 1.4* 1.3*  PROT 7.3 6.8  ALBUMIN 3.5 3.2*   No results for input(s): "LIPASE", "AMYLASE" in the last 168 hours. No results for input(s): "AMMONIA" in the last 168 hours. CBC: Recent Labs  Lab 09/20/23 0714 09/20/23 1301 09/21/23 0353  WBC 8.8 7.1 4.6  NEUTROABS  --   --  4.3  HGB 15.2* 14.8 14.6  HCT 48.1* 47.8* 45.7  MCV 91.6 92.3 91.6  PLT 233 221 197   Cardiac Enzymes: No results for input(s): "CKTOTAL", "CKMB", "CKMBINDEX", "TROPONINI" in the last 168 hours. BNP: Invalid input(s):  "POCBNP" CBG: Recent Labs  Lab 09/20/23 1846 09/21/23 0043  GLUCAP 90 158*   D-Dimer No results for input(s): "DDIMER" in the last 72 hours. Hgb A1c No results for input(s): "HGBA1C" in the last 72 hours. Lipid Profile No results for input(s): "CHOL", "HDL", "LDLCALC", "TRIG", "CHOLHDL", "LDLDIRECT" in the last 72 hours. Thyroid function studies No results for input(s): "TSH", "T4TOTAL", "T3FREE", "THYROIDAB" in the last 72 hours.  Invalid input(s): "FREET3" Anemia work up No results for input(s): "VITAMINB12", "FOLATE", "FERRITIN", "TIBC", "IRON", "RETICCTPCT" in the last 72 hours. Urinalysis    Component Value Date/Time   COLORURINE YELLOW 06/14/2022 1331   APPEARANCEUR HAZY (A) 06/14/2022 1331   LABSPEC 1.025 06/14/2022 1331   PHURINE 5.0 06/14/2022 1331   GLUCOSEU NEGATIVE 06/14/2022 1331   HGBUR NEGATIVE 06/14/2022 1331   BILIRUBINUR NEGATIVE 06/14/2022 1331   KETONESUR NEGATIVE 06/14/2022 1331   PROTEINUR 30 (A) 06/14/2022 1331   UROBILINOGEN 0.2 10/10/2014 1515   NITRITE NEGATIVE 06/14/2022 1331   LEUKOCYTESUR LARGE (A) 06/14/2022 1331   Sepsis Labs Recent Labs  Lab 09/20/23 0714 09/20/23 1301 09/21/23 0353  WBC 8.8 7.1 4.6   Microbiology Recent Results (from the past 240 hours)  Resp panel by RT-PCR (RSV, Flu A&B, Covid) Anterior Nasal Swab     Status: None   Collection Time: 09/20/23  3:22 AM   Specimen: Anterior Nasal Swab  Result Value Ref Range Status   SARS Coronavirus 2 by RT PCR NEGATIVE NEGATIVE Final    Comment: (NOTE) SARS-CoV-2 target nucleic acids are NOT DETECTED.  The SARS-CoV-2 RNA is generally detectable in upper respiratory specimens during the acute phase of infection. The lowest concentration of SARS-CoV-2 viral copies this assay can detect is 138 copies/mL. A negative result does not preclude SARS-Cov-2 infection and should not be used as the sole basis for treatment or other patient management decisions. A negative result may  occur with  improper specimen collection/handling, submission of specimen other than nasopharyngeal swab, presence of viral mutation(s) within the areas targeted by this assay, and inadequate number of viral  copies(<138 copies/mL). A negative result must be combined with clinical observations, patient history, and epidemiological information. The expected result is Negative.  Fact Sheet for Patients:  BloggerCourse.com  Fact Sheet for Healthcare Providers:  SeriousBroker.it  This test is no t yet approved or cleared by the [place] FDA and  has been authorized for detection and/or diagnosis of SARS-CoV-2 by FDA under an Emergency Use Authorization (EUA). This EUA will remain  in effect (meaning this test can be used) for the duration of the COVID-19 declaration under Section 564(b)(1) of the Act, 21 U.S.C.section 360bbb-3(b)(1), unless the authorization is terminated  or revoked sooner.       Influenza A by PCR NEGATIVE NEGATIVE Final   Influenza B by PCR NEGATIVE NEGATIVE Final    Comment: (NOTE) The Xpert Xpress SARS-CoV-2/FLU/RSV plus assay is intended as an aid in the diagnosis of influenza from Nasopharyngeal swab specimens and should not be used as a sole basis for treatment. Nasal washings and aspirates are unacceptable for Xpert Xpress SARS-CoV-2/FLU/RSV testing.  Fact Sheet for Patients: BloggerCourse.com  Fact Sheet for Healthcare Providers: SeriousBroker.it  This test is not yet approved or cleared by the [place] FDA and has been authorized for detection and/or diagnosis of SARS-CoV-2 by FDA under an Emergency Use Authorization (EUA). This EUA will remain in effect (meaning this test can be used) for the duration of the COVID-19 declaration under Section 564(b)(1) of the Act, 21 U.S.C. section 360bbb-3(b)(1), unless the authorization is terminated  or revoked.     Resp Syncytial Virus by PCR NEGATIVE NEGATIVE Final    Comment: (NOTE) Fact Sheet for Patients: BloggerCourse.com  Fact Sheet for Healthcare Providers: SeriousBroker.it  This test is not yet approved or cleared by the [place] FDA and has been authorized for detection and/or diagnosis of SARS-CoV-2 by FDA under an Emergency Use Authorization (EUA). This EUA will remain in effect (meaning this test can be used) for the duration of the COVID-19 declaration under Section 564(b)(1) of the Act, 21 U.S.C. section 360bbb-3(b)(1), unless the authorization is terminated or revoked.  Performed at [facility], [address], [place], [place] [address]   MRSA Next Gen by PCR, Nasal     Status: None   Collection Time: 09/20/23  4:00 PM   Specimen: Nasal Mucosa; Nasal Swab  Result Value Ref Range Status   MRSA by PCR Next Gen NOT DETECTED NOT DETECTED Final    Comment: (NOTE) The GeneXpert MRSA Assay (FDA approved for NASAL specimens only), is one component of a comprehensive MRSA colonization surveillance program. It is not intended to diagnose MRSA infection nor to guide or monitor treatment for MRSA infections. Test performance is not FDA approved in patients less than 35 years old. Performed at [facility], [address] [place], [place] [address]    Time coordinating discharge: 41 mins   SIGNED:  Standley Dakins, MD  [facility] 09/21/2023, 10:51 AM How to contact the [facility] Attending or Consulting provider 7A - 7P or covering provider during after hours 7P -7A, for this patient?  Check the care team in [facility] and look for a) attending/consulting [facility] provider listed and b) the [facility] team listed Log into [email or web address] and use [place]'s universal password to access. If you do not have the password, please contact the hospital operator. Locate the [facility] provider you are looking for under [facility] and  page to a number that you can be directly reached. If you still have difficulty reaching the provider,  please page the [facility] (Director on Call) for the Hospitalists listed on amion for assistance.

## 2023-09-21 NOTE — Plan of Care (Signed)
   Problem: Education: Goal: Knowledge of General Education information will improve Description: Including pain rating scale, medication(s)/side effects and non-pharmacologic comfort measures Outcome: Progressing   Problem: Pain Managment: Goal: General experience of comfort will improve and/or be controlled Outcome: Progressing   Problem: Safety: Goal: Ability to remain free from injury will improve Outcome: Progressing

## 2023-09-21 NOTE — Discharge Instructions (Signed)
 PLEASE CALL HOSPICE NURSE FIRST BEFORE CALLING 911 OR GOING TO EMERGENCY ROOM

## 2023-09-22 ENCOUNTER — Telehealth: Payer: Self-pay

## 2023-09-22 DIAGNOSIS — Z743 Need for continuous supervision: Secondary | ICD-10-CM | POA: Diagnosis not present

## 2023-09-22 DIAGNOSIS — R1111 Vomiting without nausea: Secondary | ICD-10-CM | POA: Diagnosis not present

## 2023-09-22 DIAGNOSIS — R404 Transient alteration of awareness: Secondary | ICD-10-CM | POA: Diagnosis not present

## 2023-09-22 DIAGNOSIS — R230 Cyanosis: Secondary | ICD-10-CM | POA: Diagnosis not present

## 2023-09-22 NOTE — Transitions of Care (Post Inpatient/ED Visit) (Signed)
   09/16/2023  Name: MARLAINA COBURN MRN: [id number] DOB: 06/22/1951  Today's TOC FU Call Status: Today's TOC FU Call Status:: Unsuccessful Call (1st Attempt) Unsuccessful Call (1st Attempt) Date: 08/27/2023  Attempted to reach the patient regarding the most recent Inpatient/ED visit. Patient was called in an Outreach attempt to offer VBCI  30-day TOC program. Pt is eligible for program due to potential risk for readmission and/or high utilization. Unfortunately, I was not able to speak with the patient in regards to recent hospital discharge   Left a HIPAA compliant phone message message for patient including VBCI CM contact information with request for a call back in regard to recent hospital discharge     Follow Up Plan: Additional outreach attempts will be made to reach the patient to complete the Transitions of Care (Post Inpatient/ED visit) call.   Susa Loffler , BSN, RN [facility]   VBCI-Population Health RN Care Manager Direct Dial [phone]  Fax: [phone] Website: [email or web address]

## 2023-09-23 DEATH — deceased

## 2023-09-25 ENCOUNTER — Telehealth: Payer: Self-pay

## 2023-09-25 NOTE — Transitions of Care (Post Inpatient/ED Visit) (Signed)
   09/25/2023  Name: Regina Mcmahon MRN: [id number] DOB: 1950-12-16  Today's TOC FU Call Status: Today's TOC FU Call Status:: Unsuccessful Call (2nd Attempt) Unsuccessful Call (1st Attempt) Date: 09/14/2023 Unsuccessful Call (2nd Attempt) Date: 09/25/23  Attempted to reach the patient regarding the most recent Inpatient/ED visit. Patient was called in an Outreach attempt to offer VBCI  30-day TOC program. Pt is eligible for program due to potential risk for readmission and/or high utilization. Unfortunately, I was not able to speak with the patient in regards to recent hospital discharge   Left a HIPAA compliant phone message message for patient including VBCI CM contact information with request for a call back in regard to recent hospital discharge   Follow Up Plan: Additional outreach attempts will be made to reach the patient to complete the Transitions of Care (Post Inpatient/ED visit) call.   Susa Loffler , BSN, RN [facility]   VBCI-Population Health RN Care Manager Direct Dial [phone]  Fax: [phone] Website: [email or web address]

## 2023-09-26 ENCOUNTER — Telehealth: Payer: Self-pay

## 2023-09-26 NOTE — Transitions of Care (Post Inpatient/ED Visit) (Signed)
   09/26/2023  Name: [name] MRN: [id number] DOB: [birth year]  Today's TOC FU Call Status: Three attempts have been made to reach the patient regarding the most recent Inpatient/ED visit. Today's TOC FU Call Status:: Unsuccessful Call (3rd Attempt) Unsuccessful Call (1st Attempt) Date: 08/31/2023 Unsuccessful Call (2nd Attempt) Date: 09/25/23 Unsuccessful Call (3rd Attempt) Date: 09/26/23    Based on the [facility]BCI program guidelines, if  we are unable to reach the patient  after 3 attempts, no additional outreach attempts will be made and the TOC follow-up will be closed Unfortunately, we have been unable to make contact with the patient for follow up.   The [facility] Case Management Team is available to follow up with the patient after provider conversation with the patient regarding recommendation for care management engagement and subsequent re-referral to the case management team.The [facility]BCI CM team can be reached by calling [phone]. Marland Kitchen   Follow Up Plan: No further outreach attempts will be made at this time. We have been unable to contact the patient.  Susa Loffler , BSN, RN [facility]   [facility]Population [facility] RN Care Manager Direct Dial [phone]  Fax: [phone] Website: [email or web address]

## 2023-10-23 ENCOUNTER — Other Ambulatory Visit: Payer: Self-pay | Admitting: Hematology

## 2023-11-24 ENCOUNTER — Encounter: Payer: Self-pay | Admitting: Radiology
